# Patient Record
Sex: Female | Born: 1945 | Race: Black or African American | Hispanic: No | Marital: Married | Smoking: Former smoker
Health system: Southern US, Community
[De-identification: ages and names within clinical notes are randomized; demographics above are authoritative.]

## PROBLEM LIST (undated history)

## (undated) DIAGNOSIS — T7840XA Allergy, unspecified, initial encounter: Secondary | ICD-10-CM

## (undated) DIAGNOSIS — T8859XA Other complications of anesthesia, initial encounter: Secondary | ICD-10-CM

## (undated) DIAGNOSIS — G709 Myoneural disorder, unspecified: Secondary | ICD-10-CM

## (undated) DIAGNOSIS — M1990 Unspecified osteoarthritis, unspecified site: Secondary | ICD-10-CM

## (undated) DIAGNOSIS — J449 Chronic obstructive pulmonary disease, unspecified: Secondary | ICD-10-CM

## (undated) DIAGNOSIS — Z923 Personal history of irradiation: Secondary | ICD-10-CM

## (undated) DIAGNOSIS — I10 Essential (primary) hypertension: Secondary | ICD-10-CM

## (undated) DIAGNOSIS — J45909 Unspecified asthma, uncomplicated: Secondary | ICD-10-CM

## (undated) DIAGNOSIS — E119 Type 2 diabetes mellitus without complications: Secondary | ICD-10-CM

## (undated) DIAGNOSIS — C3490 Malignant neoplasm of unspecified part of unspecified bronchus or lung: Secondary | ICD-10-CM

## (undated) DIAGNOSIS — Z9889 Other specified postprocedural states: Secondary | ICD-10-CM

## (undated) DIAGNOSIS — E7800 Pure hypercholesterolemia, unspecified: Secondary | ICD-10-CM

## (undated) DIAGNOSIS — R112 Nausea with vomiting, unspecified: Secondary | ICD-10-CM

## (undated) DIAGNOSIS — J189 Pneumonia, unspecified organism: Secondary | ICD-10-CM

## (undated) HISTORY — DX: Allergy, unspecified, initial encounter: T78.40XA

## (undated) HISTORY — DX: Myoneural disorder, unspecified: G70.9

## (undated) HISTORY — DX: Pure hypercholesterolemia, unspecified: E78.00

## (undated) HISTORY — PX: TUBAL LIGATION: SHX77

## (undated) HISTORY — DX: Type 2 diabetes mellitus without complications: E11.9

---

## 1993-12-30 HISTORY — PX: ABDOMINAL HYSTERECTOMY: SHX81

## 1998-04-19 ENCOUNTER — Other Ambulatory Visit: Admission: RE | Admit: 1998-04-19 | Discharge: 1998-04-19 | Payer: Self-pay | Admitting: Obstetrics and Gynecology

## 1999-05-17 ENCOUNTER — Other Ambulatory Visit: Admission: RE | Admit: 1999-05-17 | Discharge: 1999-05-17 | Payer: Self-pay | Admitting: Obstetrics and Gynecology

## 1999-07-24 ENCOUNTER — Encounter: Admission: RE | Admit: 1999-07-24 | Discharge: 1999-10-22 | Payer: Self-pay | Admitting: Internal Medicine

## 2000-09-16 ENCOUNTER — Other Ambulatory Visit: Admission: RE | Admit: 2000-09-16 | Discharge: 2000-09-16 | Payer: Self-pay | Admitting: Obstetrics and Gynecology

## 2000-10-29 ENCOUNTER — Encounter: Payer: Self-pay | Admitting: Internal Medicine

## 2000-10-29 ENCOUNTER — Encounter: Admission: RE | Admit: 2000-10-29 | Discharge: 2000-10-29 | Payer: Self-pay | Admitting: Internal Medicine

## 2000-11-12 ENCOUNTER — Encounter: Admission: RE | Admit: 2000-11-12 | Discharge: 2000-11-12 | Payer: Self-pay | Admitting: Internal Medicine

## 2000-11-12 ENCOUNTER — Encounter: Payer: Self-pay | Admitting: Internal Medicine

## 2000-12-15 ENCOUNTER — Encounter: Payer: Self-pay | Admitting: Obstetrics and Gynecology

## 2000-12-15 ENCOUNTER — Encounter: Admission: RE | Admit: 2000-12-15 | Discharge: 2000-12-15 | Payer: Self-pay | Admitting: Obstetrics and Gynecology

## 2001-01-05 ENCOUNTER — Observation Stay (HOSPITAL_COMMUNITY): Admission: RE | Admit: 2001-01-05 | Discharge: 2001-01-05 | Payer: Self-pay | Admitting: Obstetrics and Gynecology

## 2001-01-05 ENCOUNTER — Encounter (INDEPENDENT_AMBULATORY_CARE_PROVIDER_SITE_OTHER): Payer: Self-pay

## 2001-09-16 ENCOUNTER — Other Ambulatory Visit: Admission: RE | Admit: 2001-09-16 | Discharge: 2001-09-16 | Payer: Self-pay | Admitting: Obstetrics and Gynecology

## 2001-11-06 ENCOUNTER — Encounter: Payer: Self-pay | Admitting: Internal Medicine

## 2001-11-06 ENCOUNTER — Encounter: Admission: RE | Admit: 2001-11-06 | Discharge: 2001-11-06 | Payer: Self-pay | Admitting: Internal Medicine

## 2001-11-16 ENCOUNTER — Ambulatory Visit (HOSPITAL_COMMUNITY): Admission: RE | Admit: 2001-11-16 | Discharge: 2001-11-16 | Payer: Self-pay | Admitting: Gastroenterology

## 2002-12-15 ENCOUNTER — Encounter: Admission: RE | Admit: 2002-12-15 | Discharge: 2002-12-15 | Payer: Self-pay | Admitting: Internal Medicine

## 2002-12-15 ENCOUNTER — Encounter: Payer: Self-pay | Admitting: Internal Medicine

## 2003-02-01 ENCOUNTER — Encounter: Admission: RE | Admit: 2003-02-01 | Discharge: 2003-02-01 | Payer: Self-pay | Admitting: Internal Medicine

## 2003-02-03 ENCOUNTER — Encounter: Payer: Self-pay | Admitting: Internal Medicine

## 2003-02-03 ENCOUNTER — Encounter: Admission: RE | Admit: 2003-02-03 | Discharge: 2003-02-03 | Payer: Self-pay | Admitting: Internal Medicine

## 2003-03-31 ENCOUNTER — Other Ambulatory Visit: Admission: RE | Admit: 2003-03-31 | Discharge: 2003-03-31 | Payer: Self-pay | Admitting: Obstetrics and Gynecology

## 2003-06-29 ENCOUNTER — Encounter: Payer: Self-pay | Admitting: Internal Medicine

## 2003-06-29 ENCOUNTER — Encounter: Admission: RE | Admit: 2003-06-29 | Discharge: 2003-06-29 | Payer: Self-pay | Admitting: Internal Medicine

## 2003-08-07 ENCOUNTER — Encounter: Payer: Self-pay | Admitting: Internal Medicine

## 2003-08-07 ENCOUNTER — Encounter: Admission: RE | Admit: 2003-08-07 | Discharge: 2003-08-07 | Payer: Self-pay | Admitting: Internal Medicine

## 2003-10-12 ENCOUNTER — Inpatient Hospital Stay (HOSPITAL_COMMUNITY): Admission: EM | Admit: 2003-10-12 | Discharge: 2003-10-16 | Payer: Self-pay | Admitting: *Deleted

## 2003-10-12 ENCOUNTER — Encounter: Payer: Self-pay | Admitting: *Deleted

## 2004-12-30 HISTORY — PX: OTHER SURGICAL HISTORY: SHX169

## 2005-05-21 ENCOUNTER — Encounter: Admission: RE | Admit: 2005-05-21 | Discharge: 2005-05-21 | Payer: Self-pay | Admitting: Orthopedic Surgery

## 2005-05-22 ENCOUNTER — Ambulatory Visit (HOSPITAL_BASED_OUTPATIENT_CLINIC_OR_DEPARTMENT_OTHER): Admission: RE | Admit: 2005-05-22 | Discharge: 2005-05-22 | Payer: Self-pay | Admitting: Orthopedic Surgery

## 2005-05-22 ENCOUNTER — Ambulatory Visit (HOSPITAL_COMMUNITY): Admission: RE | Admit: 2005-05-22 | Discharge: 2005-05-22 | Payer: Self-pay | Admitting: Orthopedic Surgery

## 2005-12-30 DIAGNOSIS — I639 Cerebral infarction, unspecified: Secondary | ICD-10-CM

## 2005-12-30 HISTORY — PX: COLOSTOMY: SHX63

## 2005-12-30 HISTORY — DX: Cerebral infarction, unspecified: I63.9

## 2006-04-01 ENCOUNTER — Other Ambulatory Visit: Admission: RE | Admit: 2006-04-01 | Discharge: 2006-04-01 | Payer: Self-pay | Admitting: Obstetrics and Gynecology

## 2006-11-30 ENCOUNTER — Emergency Department (HOSPITAL_COMMUNITY): Admission: EM | Admit: 2006-11-30 | Discharge: 2006-11-30 | Payer: Self-pay | Admitting: Emergency Medicine

## 2006-12-03 ENCOUNTER — Ambulatory Visit: Payer: Self-pay | Admitting: Critical Care Medicine

## 2006-12-03 ENCOUNTER — Encounter (INDEPENDENT_AMBULATORY_CARE_PROVIDER_SITE_OTHER): Payer: Self-pay | Admitting: Specialist

## 2006-12-03 ENCOUNTER — Inpatient Hospital Stay (HOSPITAL_COMMUNITY): Admission: EM | Admit: 2006-12-03 | Discharge: 2006-12-15 | Payer: Self-pay | Admitting: Emergency Medicine

## 2006-12-28 ENCOUNTER — Ambulatory Visit: Payer: Self-pay | Admitting: Internal Medicine

## 2006-12-30 HISTORY — PX: OTHER SURGICAL HISTORY: SHX169

## 2007-01-02 ENCOUNTER — Encounter: Admission: RE | Admit: 2007-01-02 | Discharge: 2007-01-02 | Payer: Self-pay | Admitting: General Surgery

## 2007-04-20 ENCOUNTER — Encounter: Admission: RE | Admit: 2007-04-20 | Discharge: 2007-04-20 | Payer: Self-pay | Admitting: Surgery

## 2007-06-25 ENCOUNTER — Inpatient Hospital Stay (HOSPITAL_COMMUNITY): Admission: RE | Admit: 2007-06-25 | Discharge: 2007-07-01 | Payer: Self-pay | Admitting: General Surgery

## 2007-06-25 ENCOUNTER — Encounter (INDEPENDENT_AMBULATORY_CARE_PROVIDER_SITE_OTHER): Payer: Self-pay | Admitting: General Surgery

## 2007-08-27 ENCOUNTER — Encounter: Admission: RE | Admit: 2007-08-27 | Discharge: 2007-08-27 | Payer: Self-pay | Admitting: General Surgery

## 2007-12-09 ENCOUNTER — Encounter: Admission: RE | Admit: 2007-12-09 | Discharge: 2007-12-09 | Payer: Self-pay | Admitting: Cardiology

## 2007-12-11 ENCOUNTER — Ambulatory Visit (HOSPITAL_COMMUNITY): Admission: RE | Admit: 2007-12-11 | Discharge: 2007-12-11 | Payer: Self-pay | Admitting: Cardiology

## 2007-12-11 ENCOUNTER — Ambulatory Visit: Payer: Self-pay | Admitting: Vascular Surgery

## 2007-12-28 ENCOUNTER — Encounter: Admission: RE | Admit: 2007-12-28 | Discharge: 2007-12-28 | Payer: Self-pay | Admitting: Cardiology

## 2007-12-30 ENCOUNTER — Encounter: Admission: RE | Admit: 2007-12-30 | Discharge: 2007-12-30 | Payer: Self-pay | Admitting: Cardiology

## 2011-05-14 NOTE — Op Note (Signed)
[name]              ACCOUNT NO.:  [id number]   MEDICAL RECORD NO.:  [id number]          PATIENT TYPE:  INP   LOCATION:  0001                         FACILITY:  [facility]   PHYSICIAN:  Ollen Gross. Vernell Morgans, M.D. 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   PROCEDURES:  Exploratory laparotomy, lysis of adhesions, colostomy  takedown with a low EEA size 29 stapled anastomosis.   SURGEON:  Ollen Gross. Vernell Morgans, M.D.   ASSISTANT:  Angelia Mould. Derrell Lolling, M.D.   ANESTHESIA:  General endotracheal.   PROCEDURE:  After informed consent was obtained, the patient was brought  to the operating room, placed in supine position on the table.  After  adequate induction of general anesthesia, the patient's legs were placed  in stirrups, the abdomen and perirectal area were prepped with Betadine  and draped in the usual sterile manner.  Before prepping, the colostomy  was closed at the skin with a 2-0 silk stitch.  The midline incision was  made with a 10 blade knife through her old incision.  This incision was  carried down through the skin and subcutaneous tissue sharply with  electrocautery until the linea alba was identified.  The old midline  incision into the fascia was also opened sharply with electrocautery.  The preperitoneal space was identified using blunt finger dissection.  An area of free space was identified and opened into the abdominal  cavity.  The rest of the incision was then opened under direct vision.  The patient had some omental adhesion to the anterior abdominal wall  that was taken down sharply with electrocautery.  A Balfour retractor  was then placed, there were some filmy adhesions of  small bowel down in  the pelvis.  Most of this was taken down by blunt finger dissection.  There was some adhesion of the small bowel to the staple line of the  rectal stump.  This again was then taken down sharply with Metzenbaum  scissors without difficulty and small bowel appeared to be healthy.  The  rectal stump was identified.  There was some adhesion of the vaginal  cuff to the staple line.  This had to be taken down sharply with  Metzenbaum scissors.  There was a little bit of a serosal tear in the  rectal stump at this point so we created a new staple line by stapling  just below this serosal tear using a contour stapling device.  Once this  was accomplished, the staple line appeared to be and relatively good  position.  It was reinforced with some 2-0 silk stitches.  The anterior  surface of the rectum was able to be visualized.  This was low enough  that we decided we did need EEA stapled anastomosis.  At this point some  interloop adhesions of small bowel were taken down by combination of  blunt finger dissection and some sharp dissection with Metzenbaum  scissors.  The ostomy site was identified.  There was minimal herniation  of some omentum up into the ostomy.  This was taken down sharply with  the electrocautery.  We were able to dissect up into the abdominal wall  bluntly with finger dissection to encircle the colostomy.  At this point  a colostomy at the skin level was then excised sharply in elliptical  fashion with a 10 blade knife and incision was carried through the skin  into the subcutaneous tissue sharply with electrocautery until the  ostomy was completely freed from the abdominal wall.  The ostomy was  then brought through the abdominal wall and it appeared to be healthy.  The distal couple of centimeters of the ostomy were resected by clamping  the mesentery at this point with Kelly clamps, divided and ligated with  2-0 silk ties and then placing a Allen clamp  across the colon and  cutting along the Allen clamp.  This segment was sent to pathology as  the colostomy. The Allen clamp was removed and colon was opened.  A size  29 stapling device easily entered the colon.  A 2-0 Prolene pursestring  stitch was then placed in a running fashion circumferentially around the  edge of the opening of the colon with full-thickness bites.  The anvil  from the stapling device then placed in the proximal colon and the 2-0  Prolene was cinched down and tied at the base of the anvil.  The splenic  flexure appeared to have already been mobilized and the adhesions of  this a transverse colon and splenic flexure were freed up sharply with  Metzenbaum scissors until there was good mobilization and length on the  proximal segment of colon so that would reach down in the pelvis fairly  easily.  At this point attention was then turned to the rectum.  Dr.  Derrell Lolling went from below between the feet and was able to wash out a  little bit of the distal rectum.  He then placed a size 29 EEA stapling  device inside the rectum.  We were able to visualize the stapler distal  low the staple line of the rectal stump.  We positioned the stapler  appropriately so that the spike would come through the anterior wall of  the rectum.  The spike was then extended.  The anvil on the proximal  segment colon was placed on the spike so that it snapped and attached.  The stapling device was then closed.  A minute was allowed to pass.  The  stapling device was then fired thereby creating a new colorectal  anastomosis.  When the stapling device was removed there were two  complete donuts.  The anastomotic line looked good.  A soft bowel clamp  was placed above the anastomosis on the colon and Dr. Derrell Lolling then did a  rigid sigmoidoscopy.  He was able to see the staple line.  It appeared  to be hemostatic and intact.  We filled the pelvis with saline and there  were no bubbles to indicate any  sort of leak and the bowel at the  anastomosis appeared to be healthy.  The air was then evacuated.  The  abdomen was irrigated copious amounts of saline.  The old ostomy site  was then closed with interrupted #1 Novofil stitches.  The fascia of the  abdominal wall was then closed with two running #1 looped PDS  sutures.  The subcutaneous tissue of both sites was irrigated copious amounts of  saline and Betadine and skin was closed with staples.  Betadine ointment  and sterile dressings were then applied.  The patient tolerated  procedure well.  At end the case all needle, sponge instrument counts  correct.  The patient was awakened, taken recovery in stable condition.      Ollen Gross. Vernell Morgans, M.D.  Electronically Signed     PST/MEDQ  D:  06/25/2007  T:  06/25/2007  Job:  [id number]

## 2011-05-14 NOTE — Discharge Summary (Signed)
[name], DEENEY              ACCOUNT NO.:  [id number]   MEDICAL RECORD NO.:  [id number]          PATIENT TYPE:  INP   LOCATION:  1526                         FACILITY:  [facility]   PHYSICIAN:  Ollen Gross. Vernell Morgans, M.D. DATE OF BIRTH:  16-Nov-1946   DATE OF ADMISSION:  06/25/2007  DATE OF DISCHARGE:  07/01/2007                               DISCHARGE SUMMARY   HISTORY OF PRESENT ILLNESS:  Ms. Briney is a 65 year old black female  who had a previous colostomy for perforated diverticulitis.  She was  brought to the operating room on June 26 for a colostomy takedown.  She  had to have a low EEA stapled anastomosis which she tolerated well.  Postoperatively, her NG tube was left in on bowel rest.  She was  maintained on PCA for pain control, but she did very well.  She did have  one episode of some chest pain and shortness of breath, but her cardiac  enzymes and EKG and chest x-ray were all okay.  We were able to clamp  her NG tube on the 29th and removed the NG tube on the 30th and started  her on clears.  Her diet was advanced slowly and by July 2 she was  tolerating her diet.  She looked very good and was ready for discharge  home.   MEDICATIONS:  She was to resume her home medications.  She was given a  prescription for pain medicine.   DISCHARGE INSTRUCTIONS:  1. Activity is no heavy lifting.  2. Diet is as tolerated.  3. Follow-up will be with Dr. Carolynne Edouard in a week or two, and she is      discharged home.  She was also placed on Avelox for possible      pneumonia which she went home with.      Ollen Gross. Vernell Morgans, M.D.  Electronically Signed     PST/MEDQ  D:  08/18/2007  T:  08/18/2007  Job:  [id number]

## 2011-05-17 NOTE — Op Note (Signed)
[name], [name]              ACCOUNT NO.:  [id number]   MEDICAL RECORD NO.:  [id number]          PATIENT TYPE:  INP   LOCATION:  1606                         [facility]ACILITY:  [facility]   PHYSICIAN:  Ollen Gross. Vernell Morgans, M.D. DATE OF BIRTH:  10-17-1946   DATE OF PROCEDURE:  12/03/2006  DATE OF DISCHARGE:                               OPERATIVE REPORT   PREOPERATIVE DIAGNOSIS:  Bowel perforation with free intra-abdominal  air.   POSTOPERATIVE DIAGNOSIS:  Bowel perforation with free intra-abdominal  air, perforated sigmoid diverticulitis.   PROCEDURE:  Exploratory laparotomy, sigmoid colectomy with descending  colostomy and Hartmann pouch with mobilization of the splenic flexure.   SURGEON:  Ollen Gross. Vernell Morgans, M.D.   ASSISTANT:  Leonie Man, M.D.   ANESTHESIA:  General endotracheal.   PROCEDURE:  After informed consent was obtained, the patient was brought  to the operating room, placed in the supine position on the operating  table.  After induction of general anesthesia, the patient's abdomen was  prepped with Betadine and draped in usual sterile manner.  A midline  incision was made with the 10 blade knife.  This incision was carried  down through the skin and subcutaneous tissue sharply with  electrocautery until the linea alba was identified.  The linea alba was  also incised with electrocautery.  The preperitoneal space was probed  bluntly hemostat, until the peritoneum was opened and access was gained  to the abdominal cavity.  The rest incision was then opened under direct  vision with electrocautery.  A Balfour retractor was used to retract the  abdominal sidewall laterally.  The patient had a lot of contamination in  her abdomen.  Cultures were obtained.  The patient had a lot of dense  adhesions and inflammation from all the contamination to suggest that  this had been going on for least a couple of days.  The patient had a  lot of interloop small bowel abscesses and these  were all freed up by  blunt palpation of the small bowel from the ligament of Treitz to the  ileocecal valve.  Once this was accomplished, the small bowel was able  to be packed into the upper abdomen with moist gauze.  The sigmoid colon  was evaluated and appeared to be the source of the perforation.  The  patient had about a quarter size hole in the anterior surface of the  rectosigmoid area.  The sigmoid colon was mobilized by incising its  retroperitoneal attachment along the white line of Toldt.  There was  very dense adhesions and inflammation in this left lower quadrant  especially right around the pelvic brim.  Care was taken to try to stay  up on the mesentery away from any potential place where the ureter could  be.  A site was chosen above the area of inflammation for division of  the colon.  The mesentery at this point was opened sharply with  electrocautery and GIA 75 stapler placed across the bowel at this point,  clamped and fired thereby dividing bowel between staple lines.  The  mesentery to the  sigmoid colon was taken down using the LigaSure in some  of them and one or two of the major vessels in the mesentery were also  clamped with Kelly clamps, divided and ligated with 2-0 silk ties.  This  dissection was carried down into the pelvis until we were just at the  level below the area of perforation. The mobilization of the mesentery  at this point was brought up to the edge of the colon wall.  A TA-90  green load stapler was then placed across the colon rectosigmoid at this  point below the area of perforation, clamped and fired and the colon  above this was divided sharply with 10 blade knife.  This allowed [place] to  remove that section of the diseased sigmoid colon and it was sent to  pathology for further evaluation.  The abdomen was irrigated with  copious amounts of saline.  The stump of a rectal stump was marked with  two 2-0 Prolene stitches.  Next the attention was  turned to the  descending colon.  The descending colon was not mobile enough to be able  to bring this up as a colostomy through the abdominal wall.  We  therefore had to mobilize the splenic flexure.  This was done by blunt  finger dissection and some sharp dissection with the cautery.  Once that  was accomplished, the descending colon was able to be brought up more  into the wound.  It still appeared as though the distal end of this  descending colon was not mobile enough to come up so we had to divide  part of the mesentery at this distal segment.  In doing this, the very  distal few centimeters of the colon appeared to be a little bit ischemic  and this was also resected by clamping mesentery with Kelly clamps,  dividing and ligating these vessels with 2-0 silk ties.  Once the colon  was brought back more proximal and the splenic flexure was mobilized,  there was now plenty of mobility to the colon be brought through the  abdominal wall.  The site was chosen on the abdominal wall for creation  of the colostomy and the skin at the site was excised sharply in a  circular manner with a 10 blade knife.  A core of subcutaneous fat was  removed sharply with electrocautery.  A cruciate incision was made in  the fascia so that three fingers could be brought through the opening.  A Babcock grasper was then placed through the opening and used to grasp  the staple line of the descending colon and this was easily brought  through this opening to create the colostomy.  Again the abdomen was  then irrigated copious amounts of saline and all loculations were broken  up.  Once this was accomplished, the fascia of the anterior abdominal  wall was closed with two running #1 double-stranded PDS sutures.  The  subcutaneous tissue was irrigated copious amounts of saline and some  staples were placed in the skin very widely just to try to maintain the skin away from the ostomy site and then the rest of the  wound was packed  open with moistened Kerlix sterile towel was placed over the midline and  the ostomy was then addressed.  The staple line was excised sharply with  electrocautery.  The ostomy was then matured with 3-0 Vicryl stitches.  Once this was accomplished, the ostomy appeared nicely pink and viable  and patent.  An ostomy appliance was applied and sterile dressings were  applied to the midline.  The patient tolerated well.  At the end of the  case all needle, sponge and instrument counts were correct.  The patient  was then awakened and taken to recovery for further resuscitation and  evaluation.      Ollen Gross. Vernell Morgans, M.D.  Electronically Signed     PST/MEDQ  D:  12/10/2006  T:  12/10/2006  Job:  [id number]

## 2011-05-17 NOTE — H&P (Signed)
NAMEFRANCINE, Horton              ACCOUNT NO.:  [id number]   MEDICAL RECORD NO.:  [id number]          PATIENT TYPE:  INP   LOCATION:  0098                         FACILITY:  [facility]   PHYSICIAN:  Ollen Gross. Vernell Morgans, M.D. DATE OF BIRTH:  [birth year]   DATE OF ADMISSION:  12/03/2006  DATE OF DISCHARGE:                              HISTORY & PHYSICAL   [name] is a 65 year old black female who has been having abdominal  pain since last Sunday.  She came to the ER last Sunday, where she  underwent a CT scan that showed sigmoid diverticulitis.  She was then  sent home on oral antibiotics, but her pain continued to worsen.  She  now has pain all over her abdomen.  She denies any fevers but has been  chilled.  She also feels a little bit short of breath.  Her other review  of systems is unremarkable.   Her past medical history is significant for diverticulitis, high  cholesterol, diabetes.   Past surgical history is significant for total vaginal hysterectomy.   Medications include Lipitor, Levaquin, Flagyl, and Percocet.   ALLERGIES:  No known drug allergies.   SOCIAL HISTORY:  She denies the use of alcohol or tobacco products.   FAMILY HISTORY:  Noncontributory.   PHYSICAL EXAMINATION:  VITAL SIGNS:  Her temp is 98.3, blood pressure  124/75, pulse 128.  GENERAL:  She is a well-developed and well-nourished white female who  does appear to be a little bit short of breath.  Her sats are 89% on  room air.  SKIN:  Warm and dry with no jaundice.  EYES:  Extraocular muscles are intact.  Pupils are equal, round and  reactive to light.  Sclerae are anicteric.  LUNGS:  She has bilateral rhonchi with some use of accessory respiratory  muscles.  HEART:  Regular rate and rhythm with an impulse in the left chest.  ABDOMEN:  Diffusely tender with guarding and peritonitis.  EXTREMITIES:  No clubbing, cyanosis or edema.  Good strength in her arms  and legs.  PSYCHOLOGICAL:  She is alert and  oriented x3 with no evidence of anxiety  or depression.   Her labs were reviewed and were significant for a white count of 14.8,  hemoglobin 13.1.  Sodium 122, potassium 3.6, chloride 83.  Liver  functions were normal.   She had a chest x-ray and abdominal x-ray done that showed free  intraabdominal air.   ASSESSMENT/PLAN:  This is a 65 year old black female who appears to have  a perforated sigmoid diverticulitis.  Because of the risk of sepsis, I  think she needs to have urgent surgery this afternoon for exploration  and possible sigmoid colectomy and colostomy.  I have explained to her  in detail the risks and benefits of the operation as well as some of the  technical aspects, and she understands and wishes to proceed.  I am also  a little concerned that with her increasing shortness of breath that she  may end up on the ventilator for a while, and I have discussed this with  her  and she understands.  We will arrange for surgery this afternoon as soon  as the OR allows and will start her on some broad-spectrum antibiotic  therapy and starting some fluid resuscitation, given that she is  tachycardic, and her sodium is 122.      Ollen Gross. Vernell Morgans, M.D.  Electronically Signed     PST/MEDQ  D:  12/03/2006  T:  12/03/2006  Job:  [id number]

## 2011-05-17 NOTE — H&P (Signed)
[name], [name]                        ACCOUNT NO.:  [id number]   MEDICAL RECORD NO.:  [id number]                   PATIENT TYPE:  INP   LOCATION:  1856                                 FACILITY:  [facility]   PHYSICIAN:  Theressa Millard, M.D.                 DATE OF BIRTH:  [birth year]   DATE OF ADMISSION:  10/12/2003  DATE OF DISCHARGE:                                HISTORY & PHYSICAL   HISTORY OF PRESENT ILLNESS:  The patient is a 65 year old black female  admitted with asthmatic bronchitis.  She has no real history of pulmonary  problems until March of this year when she developed a cough after exposure  to hair dye. Since then she has had a tickle in her throat and a chronic  cough.  She has seen an allergist four times and has tried various  combinations of medications including albuterol, Advair, Pulmicort,  Clarinex, and Nexium.  At the current time, she is on Pulmicort, Clarinex,  and Nexium.  She also takes another medication, the name of which she does  not know.   She recently has had a markedly worsening cough and was seen yesterday at  [facility] by Dr. Delrae Alfred and treated with nebulizers, prednisone, and Z-  Pak.  She had lots of trouble through the night.  She came to the emergency  department today after calling [facility].  She is coughing up some  material, but does not know what color it is.  She has had two nebulizers  and improved after each, but then worsened.   PAST SURGICAL HISTORY:  Vaginal hysterectomy with fibroids.  Unilateral  salpingo-oophorectomy.   PAST MEDICAL HISTORY:  Diverticulitis, shoulder pain ?etiology,  hyperlipidemia.   ALLERGIES:  NIASPAN gives her flushing, and LIPITOR gives her GI upset.   SOCIAL HISTORY:  Smoking; quit in the past three days.  Half pack per day  before that.  Alcohol; none.  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   FAMILY HISTORY:  Father died of MI.  Mother died of natural causes at age  56.   REVIEW OF SYSTEMS:  All other systems are negative.   PHYSICAL EXAMINATION:  GENERAL: Well-developed, well-nourished, in no acute  distress. He is coughing intermittently.  VITAL SIGNS: Blood pressure 140/70, pulse 103, respiratory rate 22 and  unlabored. Oxygen saturation 91% on room air.  HEENT:  Pupils equal, round, and reactive to light.  Extraocular movements  are intact.  Funduscopic examination is normal.  The ears are normal.  Nose  and throat are unremarkable.  NECK:  Supple, thyroid is not enlarged or tender.  CHEST:  Diffuse inspiratory and expiratory wheezing.  HEART:  Normal S1 and S2 without an S3, S4, murmur, rub, or click.  ABDOMEN:  Soft and nontender with normal bowel sounds without  hepatosplenomegaly or mass.  EXTREMITIES:  Without cyanosis, clubbing, or edema.  LABORATORY DATA:  CK-MB negative, BNP less than 5.  Hemoglobin 14.1, white  count 8400.   RADIOLOGY:  CT scan shows left lower lobe atelectasis and a small pleural  effusion.  There is no evidence of PE.   IMPRESSION:  Asthmatic bronchitis. The patient has failed outpatient  treatment with nebulizers, prednisone, etc.  Will be admitted, placed on  Solu-Medrol, nebulizers, and Rocephin.  Will try to ambulate and get out of  the hospital as quickly as possible.                                                Theressa Millard, M.D.    JO/MEDQ  D:  10/12/2003  T:  10/12/2003  Job:  [id number]

## 2011-05-17 NOTE — Op Note (Signed)
NAMEAFTIN, LYE              ACCOUNT NO.:  [id number]   MEDICAL RECORD NO.:  [id number]          PATIENT TYPE:  OUT   LOCATION:  DFTL                         FACILITY:  [facility]   PHYSICIAN:  Harvie Junior, M.D.   DATE OF BIRTH:  [birth year]   DATE OF PROCEDURE:  05/22/2005  DATE OF DISCHARGE:  05/22/2005                                 OPERATIVE REPORT   PREOPERATIVE DIAGNOSIS:  Carpal tunnel syndrome, right with trigger thumb,  right.   POSTOPERATIVE DIAGNOSIS:  Carpal tunnel syndrome, right with trigger thumb,  right.   OPERATION PERFORMED:   SURGEON:  Harvie Junior, M.D.   ASSISTANT:  Marshia Ly, P.A.   ANESTHESIA:  General.   INDICATIONS FOR PROCEDURE:  Ms. Stogner is a 65 year old female with a long  history of having significant symptomatic right carpal tunnel syndrome. We  ultimately had diagnosed her some many months ago and with EMG which showed  that she needed urgent intervention because of axonal loss and  reason we  did not see her for another five months ago and she is brought to the  operating room for carpal tunnel release based on EMG findings and failure  of conservative care.  She was also noted to have a trigger thumb prior to  surgery and this was felt to need to be released at the same time.  The  patient was brought to the operating room for these procedures.   DESCRIPTION OF PROCEDURE:  The patient was brought to the operating room and  after adequate anesthesia was obtained with general anesthetic, the patient  was placed supine on the operating table.  The right hand was then prepped  and draped in the usual sterile fashion.  Following this, a curved incision  was made just ulnar to the midline wrist crease and subcutaneous tissue  dissected down to the level of the volar carpal ligament, which was clearly  identified.  A small rent was made and a Therapist, nutritional was used to free up  the nerve underneath.  The ligament was then divided  proximally and  distally.  Excellent of the ligament was identified.  A gloved finger could  be placed in the wound proximally and distally.  Attention was then turned  to the nerve which was very beat-up looking.  The nerve was identified for  any signs of any kind of inflammatory condition.  There was a small amount  of inflammatory synovitis in the canal, which was debrided off the flexor  tendon and at this point the wound was copiously irrigated and suctioned  dry.  The incision was then closed with a combination of interrupted and  running 4-0 nylon suture.  Sterile and compressive dressing was applied and  attention was then turned to the right thumb where a small linear incision  was made in the distal thumb crease.  Subcutaneous tissue dissected to the  level of the A1 pulley which was then identified and divided, care being  taken to protect the digital nerves with the exposure.  Once the A1 pulley  had been released, the flexor  tendon to the thumb could be pulled off the  wound easily with no restriction.  At this point the wound was copiously  irrigated and suctioned dry, closed with interrupted nylon sutures.  Sterile compressive dressing was applied to both wounds at this point as  well as a volar plaster.  The patient was taken to the recovery room where  she was noted to be in satisfactory condition.  The estimated blood loss for  this procedure was none.       JLG/MEDQ  D:  07/16/2005  T:  07/17/2005  Job:  [id number]

## 2011-05-17 NOTE — Discharge Summary (Signed)
[facility]  Patient:    [name], [name]                     MRN: [id number] Adm. Date:  [id number] Disc. Date: [id number] Attending:  Shaune Spittle Dictator:   Henreitta Leber, P.A.                           Discharge Summary  DATE OF BIRTH:                22-Oct-1946.  DISCHARGE DIAGNOSES:          1. Pelvic pain.                               2. Left hydrosalpinx.                               3. Extensive pelvic adhesions.                               4. Left ovarian cyst.                               5. Right parovarian cyst.  PROCEDURE ON January 05, 2001:              Operative laparoscopy with a left                               salpingo-oophorectomy, lysis of adhesions,                               and a right ovarian cystectomy.  HISTORY OF PRESENT ILLNESS:   [name] is a 65 year old, menopausal, married, African-American female who is status post total vaginal hysterectomy with a history of pelvic pain. Patient was found in November of 2001 to have a persistent left ovarian cystic mass with septations and presents for laparoscopic removal of the same. Please see patients dictated history and physical examination for details.  PHYSICAL EXAMINATION:  VITAL SIGNS:                  Blood pressure 120/80.  GENERAL:                      Within normal limits.  PELVIC:                       EGBUS is within normal limits. The vagina is slightly atrophic with a well-healed and well-suspended vaginal vault. The cervix and uterus are surgically absent. Adnexa without masses that are palpable.  RECTOVAGINAL:                 No masses.  HOSPITAL COURSE:              On January 05, 2001 patient underwent a diagnostic laparoscopy which resulted in a left salpingo-oophorectomy, lysis of adhesions, and a right ovarian cystectomy. Patient tolerated all procedures well. On the afternoon of patients surgery, she was able to  tolerate a regular diet, resume bowel and bladder functions, and  deemed ready for discharge home.  DISCHARGE MEDICATIONS:        1. Vioxx 12.5 mg one tablet daily for pain.                               2. Vicodin one to two tablets every four to six                                  hours as needed for pain.  DISCHARGE INSTRUCTIONS:       Patient given a copy of [facility] Instructions for Laparoscopy. Patient was further advised to call the office of [facility] and Gynecology for any questions or concerns.  FOLLOWUP:                     Patient is to follow up with Dr. Dierdre Forth at [facility] and Gynecology in two weeks.  PATHOLOGY:                    Not available at time of discharge.DD:  01/05/01 TD:  01/05/01 Job: [id number]

## 2011-05-17 NOTE — Discharge Summary (Signed)
NAMESUGAR, VANZANDT                        ACCOUNT NO.:  [id number]   MEDICAL RECORD NO.:  [id number]                   PATIENT TYPE:  INP   LOCATION:  5732                                 FACILITY:  [facility]   PHYSICIAN:  Marcene Duos, M.D.         DATE OF BIRTH:  Jun 08, 1946   DATE OF ADMISSION:  10/12/2003  DATE OF DISCHARGE:  10/16/2003                                 DISCHARGE SUMMARY   ADMITTING DIAGNOSES:  1. Acute bronchitis.  2. Asthma exacerbation secondary to number 1.  3. Tobacco abuse.   DISCHARGE DIAGNOSES:  1. Acute bronchitis.  2. Asthma exacerbation secondary to number 1.  3. Tobacco abuse.  4. Hyperglycemia.   HISTORY OF PRESENT ILLNESS/HOSPITAL COURSE:  This is a 65 year old female,  patient of mine, admitted by Dr. Benjaman Kindler on October 12, 2003, with a  history of worsening cough for which he was seen by myself the day previous  at the office.  The patient had been initiated on prednisone, Z-pak, and  treated with albuterol.  The patient came to the ER the following day with  worsening symptoms.   On physical examination, oxygen saturation was 91% on room air.  She had  diffuse inspiratory and expiratory wheezing.  The rest of her exam was  essentially unremarkable. Cardiac enzymes were negative.  White count 8400,  hemoglobin 14.1.   CT scan showed left lower-lobe atelectasis, small pleural effusion, no  evidence of PE.  The patient was admitted, placed on IV Solu-Medrol,  nebulizers and Rocephin.   HOSPITAL COURSE:  Problem #1.  Acute bronchitis with asthma exacerbation.  The patient was switched to IV Zithromax the day after admission.  Expectorant and regular nebulizations were added.  Over the ensuing days,  she was gradually weaned to oral medications with gradual improvement.  Lungs with occasional wheeze on discharge, October 16, 2003, but much  improved.   Problem #2.  Hyperglycemia.  The patient was noted the second day of  admission that her sugars were quite high.  She was placed on an insulin  sliding scale.  Because her sugars were still running in the 200's at the  time of discharge, she was initiated on Amaryl until she could complete her  weaning as an outpatient.  Sugars were much improved with this treatment.   DISCHARGE MEDICATIONS:  The patient was discharged on October 16, 2003, on  the following medications  1. Prednisone 40 mg daily for three more days to wean to 20 mg for three     days thereafter, and then 10 mg and then off.  2. Nexium 40 mg daily.  3. Zithromax 500 mg daily.  4. Clarinex 5 mg daily.  5. Pulmicort Turbuhaler once daily.  6. Albuterol MDI two puffs every four hours as needed.  7. Ranitidine 150 mg at bedtime.  8. Amaryl 2 mg once daily.   DISCHARGE INSTRUCTIONS:  1. She was to call the  office for followup in one week with Dr. Delrae Alfred.  2. Follow a low-carbohydrate diet.   DISCHARGE CONDITION:  The patient was discharged in improved condition.                                                Marcene Duos, M.D.    EMM/MEDQ  D:  11/18/2003  T:  11/20/2003  Job:  [id number]

## 2011-05-17 NOTE — H&P (Signed)
[facility]  Patient:    [name], [name]                        MRN: [id number] Adm. Date:  01/05/01 Attending:  Erie Noe P. Pennie Rushing, M.D.                         History and Physical  HISTORY OF PRESENT ILLNESS:  The patient is a 65 year old black married female who underwent a total vaginal hysterectomy in 1994 who presents for evaluation and treatment of a cystic pelvic mass thought to be of ovarian origin.  She was originally seen on October 29, 2000, for abdominal pelvic pain and underwent a pelvic ultrasound on November 12, 2000, which showed a 4.4 x 3.7 x 1.9 cm cyst associated with the left ovary, simple in appearance, and containing a thin septation within its inferior portion.  The patient was noted to be menopausal based on an estradiol less than 20 and an FSH of 31.2. She also underwent a CA-125 which was normal at 5.0.  A repeat pelvic ultrasound on December 15, 2000, showed no significant change in the left ovarian cyst with some septation and the right ovary was thought to be normal. The patient is now completely asymptomatic without any further abdominal pain.  PAST MEDICAL HISTORY:  GYN history:  The patient underwent a total vaginal hysterectomy in 1994 for abnormal uterine bleeding and fibroids.  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  FAMILY HISTORY:  Positive for hypertension, glaucoma, hypothyroidism, heart disease, diabetes.  SOCIAL HISTORY:  The patient smokes approximately a half pack of cigarettes a day and has for the last 20 years.  Alcohol - none.  Recreational drugs - none.  OBSTETRICAL HISTORY:  The patient has three biological children.  REVIEW OF SYSTEMS:  Positive for vasomotor symptoms.  She denies any abdominal pain, nausea, vomiting,  constipation, or diarrhea.  PHYSICAL EXAMINATION:  VITAL SIGNS:  Blood pressure is 120/80.  LUNGS:  Clear.  HEART:  Regular  rate and rhythm.  ABDOMEN:  Soft without masses or organomegaly.  EXTREMITIES:  No clubbing, cyanosis, or edema.  PELVIC:  EG, BUS within normal limits.  The vagina is slightly atrophic with a well-healed and well-suspended vaginal vault.  The cervix and uterus are surgically absent.  Adnexa - no masses that are palpable.  RECTOVAGINAL:  No masses.  IMPRESSION:  Persistent left ovarian cystic mass with septations, with a normal CA-125 and a simple cystic appearance eon ultrasound.  DISPOSITION:  Discussion is held with patient concerning the fact that persistence of this pelvic mass causes [place] to need to investigate its origin. She will thus undergo diagnostic laparoscopy with pelvic and peritoneal washings and possible left oophorectomy on January 05, 2001.  The patient seems to understand the risks of anesthesia, bleeding, infection, and damage to adjacent organs.  She wishes to proceed with left oophorectomy. DD:  12/25/00 TD:  12/25/00 Job: [id number]

## 2011-05-17 NOTE — Op Note (Signed)
[facility]  Patient:    [name], [name]                     MRN: [id number] Proc. Date: 01/05/01 Adm. Date:  [id number] Attending:  Shaune Spittle CC:         Quitman Livings, M.D.   Operative Report  PREOPERATIVE DIAGNOSIS:        Pelvic pain, left adnexal mass, apparent ovarian cyst.  POSTOPERATIVE DIAGNOSIS:       Left hydrosalpinx, extensive left pelvic adhesions, left ovarian cyst and right para-ovarian cyst.  OPERATION:                                Operative laparoscopy, left salpingo-oophorectomy, lysis of adhesions and right ovarian cystectomy.  SURGEON:                       Vanessa P. Pennie Rushing, M.D.  FIRST ASSISTANT:               Henreitta Leber, PA-C.  ANESTHESIA:                    General orotracheal.  ESTIMATED BLOOD LOSS:          Approximately 100 cc.  COMPLICATIONS:                 None.  FINDINGS:                      The left tube and ovary were densely adherent to the left pelvic sidewall.  The left tube was dilated significantly and adherent to the left ovary.  The right ovary appeared normal for the perimenopausal/postmenopausal state except for a 3 mm clear unilocular cyst which appeared to be para-ovarian, but may have been part of the ovarian tissue.  There were no peritoneal excrescences.  There were extensive adhesions between the bowel and left pelvic sidewall.  DESCRIPTION OF PROCEDURE:      The patient was taken to the operating room fter appropriate identification and placed on the operating table.  After the attainment of adequate general anesthesia, she was placed in the lithotomy position.  The abdomen, perineum and vagina were prepped with multiple layers of Betadine and a Foley catheter inserted into the bladder under sterile conditions and connected to straight drainage. A sponge stick with three sponges was placed in the vagina.  The abdomen was draped as a sterile field. The subumbilical and  superpubic regions were infiltrated with 0.25% Marcaine a total of 10 cc.  A subumbilical incision was made and a Veress cannula placed through that incision into the peritoneal cavity.  A pneumoperitoneum was created with 4L of CO2.  The Veress cannula was removed and laparoscopic trocar placed through that incision into the peritoneal cavity.  The laparoscope was placed through the trocar sleeve.  Superpubic incisions were made to the right and left of midline and laparoscopic probe trocars placed through those incisions into the peritoneal cavity under direct visualization. The above noted findings were made and documented.  The Harmonic scalpel was used to meticulously lyse the adhesions between the bowel and pelvic sidewall, the bowel and left tube and ovary and to dissect the left tube and ovary away from the left pelvic sidewall.  The ureter was identified early on and hydrodissection was used to increase the distance between the  left pelvic peritoneum and the ureter.  This allowed the left ovary to be dissected out of that fossa and elevated.  The tube and ovary were dissected down to the level of infundibulopelvic ligament which was then tied with three Endoloops and the tube and ovary excised.  On the right pelvic sidewall attempt was made to aspirate the cyst; however, once the needlepoint touched the cyst, it ruptured and there was egress of a minimal amount of straw-colored fluid.  The cyst wall was then excised sharply from the right adnexal region and removed from the operative field.  Copious irrigation was carried out and hemostasis noted to be adequate.  The left adnexa was then placed in an endo bag and brougth through the subumbilical incision as the process was visualized with a 5 mm scope which had been placed through the trocar port in the left superpubic region.  The pelvis was again inspected and hemostasis noted to be adequate. Copious irrigation was carried out  leaving approximately 100 cc of warm lactated ringers in the pelvis.  All instruments were then removed from the peritoneal cavity under direct visualization and the CO2 was allowed to escape.  A deep suture was placed in the fascia of the subumbilical incision of 0 Vicryl.  The skin incisions were reapproximated with Dermabond.  The sponge stick was removed from the vagina and  the patient awakened from general anesthesia and then taken to the recovery room in satisfactory condition having tolerated the procedure well.  Sponge and instrument counts were correct. DD:  01/05/01 TD:  01/05/01 Job: [id number]

## 2011-05-17 NOTE — Discharge Summary (Signed)
[name], [name]              ACCOUNT NO.:  [id number]   MEDICAL RECORD NO.:  [id number]          PATIENT TYPE:  INP   LOCATION:  1606                         [facility]ACILITY[facility]  [facility]   PHYSICIAN:  Ollen Gross. Vernell Morgans, M.D. DATE OF BIRTH:  1946/05/06   DATE OF ADMISSION:  12/03/2006  DATE OF DISCHARGE:  12/15/2006                               DISCHARGE SUMMARY   Ms. Leisure is a 65 year old black female who was admitted on December 5  with evidence of peritonitis and free air from a perforated sigmoid  diverticulitis.  She was taken to the operating room urgently and  underwent a sigmoid colectomy and a colostomy.  Postoperatively she was  showing signs of sepsis and was transferred to the ICU for recovery.  Critical care medicine was consulted to help [place] with her critical care  management.  She was started on Unison and Flagyl for antibiotic  coverage.  Critical care placed a central line and began sepsis  protocol.  She maintained an NG tube and bowel rest and was started on  TPN within the first couple of days.  She gradually improved.  She never  did require intubation and mechanical ventilation.  Her wound was left  open and dressing changes were done.  On December 8, she started  receiving some diuretics, and she tolerated this well.  She she did have  1 skin bleeder in the wound that required a stitch by Dr. Zachery Dakins on  December 9.  She continued to maintain an elevated white count around  18,000 and on December 10, she underwent a CT scan of her abdomen and  pelvis to rule out abscess.  She did have some small amount of fluid in  the pericolic gutter on December 11.  Interventional radiology was able  to aspirate this, but it was not enough to place a drain in.  We were  able to clamp her NG tube on December 12 which she seemed to tolerate.  Also, during this period of time, she was noted to have thrombocytosis  with an elevated platelet count and once her NG tube was out and she  was  started on clears, she was also started on an aspirin for this.  Hematology consults were obtained.  Her diet was slowly advanced, and  she was switched to Primaxin which helped her white count.  Her central  line was removed on December 14, and she continued to improve and by  December 17, she was tolerating a diet, ambulating and she was ready for  discharge home.   HER MEDICATIONS AT TIME OF DISCHARGE:  She was to resume her home  medications, and she was given prescriptions of Vicodin for pain, and  she was instructed to start aspirin for her thrombocytosis.  Her diet is  as tolerated.  Home Health was consulted for home health dressing  changes.   ACTIVITIES:  No heavy lifting.  Condition is stable.   FINAL DIAGNOSIS:  Perforated sigmoid diverticulitis.   FOLLOWUP:  With Dr. Carolynne Edouard in the next week or two.      Ollen Gross. Carolynne Edouard  III, M.D.  Electronically Signed     PST/MEDQ  D:  03/10/2007  T:  03/12/2007  Job:  [id number]

## 2011-07-11 ENCOUNTER — Other Ambulatory Visit: Payer: Self-pay | Admitting: Internal Medicine

## 2011-07-11 ENCOUNTER — Ambulatory Visit
Admission: RE | Admit: 2011-07-11 | Discharge: 2011-07-11 | Disposition: A | Discharge status: Home / Self Care | Payer: BC Managed Care – PPO | Source: Ambulatory Visit | Attending: Internal Medicine | Admitting: Internal Medicine

## 2011-07-11 DIAGNOSIS — R05 Cough: Secondary | ICD-10-CM

## 2011-07-11 DIAGNOSIS — R059 Cough, unspecified: Secondary | ICD-10-CM

## 2011-10-16 LAB — BASIC METABOLIC PANEL WITH GFR
BUN: 5 — ABNORMAL LOW
CO2: 24
Calcium: 7.9 — ABNORMAL LOW
Chloride: 107
Creatinine, Ser: 0.69
GFR calc non Af Amer: >60
Glucose, Bld: 131 — ABNORMAL HIGH
Potassium: 3.7
Sodium: 138

## 2011-10-16 LAB — BASIC METABOLIC PANEL
BUN: 6
BUN: 7
CO2: 26
CO2: 27
Calcium: 8.1 — ABNORMAL LOW
Calcium: 9.7
Chloride: 107
Chloride: 110
Creatinine, Ser: 0.64
Creatinine, Ser: 0.72
GFR calc Af Amer: >60
GFR calc Af Amer: >60
GFR calc non Af Amer: >60
GFR calc non Af Amer: >60
Glucose, Bld: 123 — ABNORMAL HIGH
Glucose, Bld: 157 — ABNORMAL HIGH
Potassium: 4
Potassium: 4.5
Sodium: 141
Sodium: 142

## 2011-10-16 LAB — DIFFERENTIAL
Basophils Absolute: 0
Basophils Absolute: 0.1
Basophils Absolute: 0.1
Basophils Relative: 0
Basophils Relative: 1
Basophils Relative: 1
Eosinophils Absolute: 0
Eosinophils Absolute: 0
Eosinophils Absolute: 0
Eosinophils Relative: 0
Eosinophils Relative: 0
Eosinophils Relative: 1
Lymphocytes Relative: 13
Lymphocytes Relative: 20
Lymphocytes Relative: 45
Lymphs Abs: 1.5
Lymphs Abs: 1.9
Lymphs Abs: 2.8
Monocytes Absolute: 0.4
Monocytes Absolute: 0.4
Monocytes Absolute: 0.4
Monocytes Relative: 4
Monocytes Relative: 4
Monocytes Relative: 6
Neutro Abs: 3.1
Neutro Abs: 7.3
Neutro Abs: 8.9 — ABNORMAL HIGH
Neutrophils Relative %: 48
Neutrophils Relative %: 75
Neutrophils Relative %: 83 — ABNORMAL HIGH

## 2011-10-16 LAB — CBC
HCT: 30.1 — ABNORMAL LOW
HCT: 32.1 — ABNORMAL LOW
HCT: 38.6
Hemoglobin: 10.3 — ABNORMAL LOW
Hemoglobin: 11.1 — ABNORMAL LOW
Hemoglobin: 13.3
MCHC: 34.4
MCHC: 34.6
MCHC: 34.6
MCV: 86.5
MCV: 86.7
MCV: 87
Platelets: 370
Platelets: 371
Platelets: 482 — ABNORMAL HIGH
RBC: 3.45 — ABNORMAL LOW
RBC: 3.71 — ABNORMAL LOW
RBC: 4.46
RDW: 15.3 — ABNORMAL HIGH
RDW: 15.6 — ABNORMAL HIGH
RDW: 15.8 — ABNORMAL HIGH
WBC: 10.8 — ABNORMAL HIGH
WBC: 6.4
WBC: 9.7

## 2011-10-16 LAB — CARDIAC PANEL(CRET KIN+CKTOT+MB+TROPI)
CK, MB: 5.4 — ABNORMAL HIGH
Relative Index: 0.3
Total CK: 1674 — ABNORMAL HIGH
Troponin I: 0.02

## 2012-10-26 ENCOUNTER — Ambulatory Visit: Payer: Self-pay | Admitting: Obstetrics and Gynecology

## 2012-12-30 HISTORY — PX: COLONOSCOPY: SHX174

## 2012-12-30 HISTORY — PX: OTHER SURGICAL HISTORY: SHX169

## 2013-01-07 DIAGNOSIS — H10409 Unspecified chronic conjunctivitis, unspecified eye: Secondary | ICD-10-CM | POA: Diagnosis not present

## 2013-01-18 DIAGNOSIS — Z79899 Other long term (current) drug therapy: Secondary | ICD-10-CM | POA: Diagnosis not present

## 2013-01-18 DIAGNOSIS — E1129 Type 2 diabetes mellitus with other diabetic kidney complication: Secondary | ICD-10-CM | POA: Diagnosis not present

## 2013-01-18 DIAGNOSIS — N182 Chronic kidney disease, stage 2 (mild): Secondary | ICD-10-CM | POA: Diagnosis not present

## 2013-01-18 DIAGNOSIS — N959 Unspecified menopausal and perimenopausal disorder: Secondary | ICD-10-CM | POA: Diagnosis not present

## 2013-01-18 DIAGNOSIS — R109 Unspecified abdominal pain: Secondary | ICD-10-CM | POA: Diagnosis not present

## 2013-01-22 DIAGNOSIS — R109 Unspecified abdominal pain: Secondary | ICD-10-CM | POA: Diagnosis not present

## 2013-01-22 DIAGNOSIS — E7800 Pure hypercholesterolemia, unspecified: Secondary | ICD-10-CM | POA: Diagnosis not present

## 2013-01-22 DIAGNOSIS — E8881 Metabolic syndrome: Secondary | ICD-10-CM | POA: Diagnosis not present

## 2013-02-15 DIAGNOSIS — H10409 Unspecified chronic conjunctivitis, unspecified eye: Secondary | ICD-10-CM | POA: Diagnosis not present

## 2013-03-08 DIAGNOSIS — H10409 Unspecified chronic conjunctivitis, unspecified eye: Secondary | ICD-10-CM | POA: Diagnosis not present

## 2013-05-19 DIAGNOSIS — E1129 Type 2 diabetes mellitus with other diabetic kidney complication: Secondary | ICD-10-CM | POA: Diagnosis not present

## 2013-05-19 DIAGNOSIS — I129 Hypertensive chronic kidney disease with stage 1 through stage 4 chronic kidney disease, or unspecified chronic kidney disease: Secondary | ICD-10-CM | POA: Diagnosis not present

## 2013-05-19 DIAGNOSIS — Z79899 Other long term (current) drug therapy: Secondary | ICD-10-CM | POA: Diagnosis not present

## 2013-05-19 DIAGNOSIS — N182 Chronic kidney disease, stage 2 (mild): Secondary | ICD-10-CM | POA: Diagnosis not present

## 2013-05-25 DIAGNOSIS — E559 Vitamin D deficiency, unspecified: Secondary | ICD-10-CM | POA: Diagnosis not present

## 2013-06-14 DIAGNOSIS — R059 Cough, unspecified: Secondary | ICD-10-CM | POA: Diagnosis not present

## 2013-06-14 DIAGNOSIS — J309 Allergic rhinitis, unspecified: Secondary | ICD-10-CM | POA: Diagnosis not present

## 2013-06-14 DIAGNOSIS — I10 Essential (primary) hypertension: Secondary | ICD-10-CM | POA: Diagnosis not present

## 2013-06-14 DIAGNOSIS — R05 Cough: Secondary | ICD-10-CM | POA: Diagnosis not present

## 2013-07-27 DIAGNOSIS — K219 Gastro-esophageal reflux disease without esophagitis: Secondary | ICD-10-CM | POA: Diagnosis not present

## 2013-09-01 DIAGNOSIS — E669 Obesity, unspecified: Secondary | ICD-10-CM | POA: Diagnosis not present

## 2013-09-01 DIAGNOSIS — K219 Gastro-esophageal reflux disease without esophagitis: Secondary | ICD-10-CM | POA: Diagnosis not present

## 2013-09-01 DIAGNOSIS — J3489 Other specified disorders of nose and nasal sinuses: Secondary | ICD-10-CM | POA: Diagnosis not present

## 2013-09-27 DIAGNOSIS — J069 Acute upper respiratory infection, unspecified: Secondary | ICD-10-CM | POA: Diagnosis not present

## 2013-09-27 DIAGNOSIS — J209 Acute bronchitis, unspecified: Secondary | ICD-10-CM | POA: Diagnosis not present

## 2013-09-27 DIAGNOSIS — H109 Unspecified conjunctivitis: Secondary | ICD-10-CM | POA: Diagnosis not present

## 2013-10-01 DIAGNOSIS — J069 Acute upper respiratory infection, unspecified: Secondary | ICD-10-CM | POA: Diagnosis not present

## 2013-10-04 DIAGNOSIS — J309 Allergic rhinitis, unspecified: Secondary | ICD-10-CM | POA: Diagnosis not present

## 2013-10-04 DIAGNOSIS — N182 Chronic kidney disease, stage 2 (mild): Secondary | ICD-10-CM | POA: Diagnosis not present

## 2013-10-04 DIAGNOSIS — E669 Obesity, unspecified: Secondary | ICD-10-CM | POA: Diagnosis not present

## 2013-10-04 DIAGNOSIS — E1129 Type 2 diabetes mellitus with other diabetic kidney complication: Secondary | ICD-10-CM | POA: Diagnosis not present

## 2013-10-04 DIAGNOSIS — E559 Vitamin D deficiency, unspecified: Secondary | ICD-10-CM | POA: Diagnosis not present

## 2013-10-04 DIAGNOSIS — Z0000 Encounter for general adult medical examination without abnormal findings: Secondary | ICD-10-CM | POA: Diagnosis not present

## 2013-10-04 DIAGNOSIS — Z0100 Encounter for examination of eyes and vision without abnormal findings: Secondary | ICD-10-CM | POA: Diagnosis not present

## 2013-10-04 DIAGNOSIS — Z6832 Body mass index (BMI) 32.0-32.9, adult: Secondary | ICD-10-CM | POA: Diagnosis not present

## 2013-10-04 DIAGNOSIS — Z0110 Encounter for examination of ears and hearing without abnormal findings: Secondary | ICD-10-CM | POA: Diagnosis not present

## 2013-10-04 DIAGNOSIS — I129 Hypertensive chronic kidney disease with stage 1 through stage 4 chronic kidney disease, or unspecified chronic kidney disease: Secondary | ICD-10-CM | POA: Diagnosis not present

## 2013-10-04 DIAGNOSIS — R05 Cough: Secondary | ICD-10-CM | POA: Diagnosis not present

## 2013-10-04 DIAGNOSIS — R059 Cough, unspecified: Secondary | ICD-10-CM | POA: Diagnosis not present

## 2013-10-07 DIAGNOSIS — Z23 Encounter for immunization: Secondary | ICD-10-CM | POA: Diagnosis not present

## 2013-10-11 DIAGNOSIS — Z23 Encounter for immunization: Secondary | ICD-10-CM | POA: Diagnosis not present

## 2013-10-14 DIAGNOSIS — Z1231 Encounter for screening mammogram for malignant neoplasm of breast: Secondary | ICD-10-CM | POA: Diagnosis not present

## 2014-02-03 DIAGNOSIS — R7309 Other abnormal glucose: Secondary | ICD-10-CM | POA: Diagnosis not present

## 2014-02-03 DIAGNOSIS — R635 Abnormal weight gain: Secondary | ICD-10-CM | POA: Diagnosis not present

## 2014-02-03 DIAGNOSIS — R079 Chest pain, unspecified: Secondary | ICD-10-CM | POA: Diagnosis not present

## 2014-02-03 DIAGNOSIS — IMO0001 Reserved for inherently not codable concepts without codable children: Secondary | ICD-10-CM | POA: Diagnosis not present

## 2014-02-03 DIAGNOSIS — J42 Unspecified chronic bronchitis: Secondary | ICD-10-CM | POA: Diagnosis not present

## 2014-02-28 ENCOUNTER — Other Ambulatory Visit (INDEPENDENT_AMBULATORY_CARE_PROVIDER_SITE_OTHER): Payer: Medicare Other

## 2014-02-28 ENCOUNTER — Ambulatory Visit (INDEPENDENT_AMBULATORY_CARE_PROVIDER_SITE_OTHER)
Admission: RE | Admit: 2014-02-28 | Discharge: 2014-02-28 | Disposition: A | Discharge status: Home / Self Care | Payer: Medicare Other | Source: Ambulatory Visit | Attending: Internal Medicine | Admitting: Internal Medicine

## 2014-02-28 ENCOUNTER — Ambulatory Visit (INDEPENDENT_AMBULATORY_CARE_PROVIDER_SITE_OTHER): Payer: Medicare Other | Admitting: Internal Medicine

## 2014-02-28 ENCOUNTER — Encounter: Payer: Self-pay | Admitting: Internal Medicine

## 2014-02-28 ENCOUNTER — Encounter (INDEPENDENT_AMBULATORY_CARE_PROVIDER_SITE_OTHER): Payer: Self-pay

## 2014-02-28 VITALS — BP 126/74 | HR 118 | Ht 66.0 in | Wt 205.8 lb

## 2014-02-28 DIAGNOSIS — J440 Chronic obstructive pulmonary disease with acute lower respiratory infection: Secondary | ICD-10-CM

## 2014-02-28 DIAGNOSIS — J309 Allergic rhinitis, unspecified: Secondary | ICD-10-CM

## 2014-02-28 DIAGNOSIS — R0609 Other forms of dyspnea: Secondary | ICD-10-CM | POA: Diagnosis not present

## 2014-02-28 DIAGNOSIS — J42 Unspecified chronic bronchitis: Secondary | ICD-10-CM | POA: Diagnosis not present

## 2014-02-28 DIAGNOSIS — J3089 Other allergic rhinitis: Secondary | ICD-10-CM

## 2014-02-28 DIAGNOSIS — R0989 Other specified symptoms and signs involving the circulatory and respiratory systems: Secondary | ICD-10-CM

## 2014-02-28 DIAGNOSIS — J302 Other seasonal allergic rhinitis: Secondary | ICD-10-CM

## 2014-02-28 DIAGNOSIS — R0600 Dyspnea, unspecified: Secondary | ICD-10-CM

## 2014-02-28 LAB — BRAIN NATRIURETIC PEPTIDE: Pro B Natriuretic peptide (BNP): 5 pg/mL (ref 0.0–100.0)

## 2014-02-28 NOTE — Patient Instructions (Signed)
Sample Anoro inhaler 1 puff, once daily  Order- schedule PFT  Dx chronic bronchitis  Order- CXR dx chronic bronchitis              Lab- Allergy Profile- dx chronic bronchitis                      BNP dx dyspnea

## 2014-02-28 NOTE — Progress Notes (Signed)
02/28/14- 56 yoF former smoker Self referral d/t tickle in throat and constant cough x 4-5 years.  Referred courtesy of Dr [facility]. Cough worse x 5 months. Productive white sputum. Cough is usually intermittent but this year persistent since October. Throat gurgle bothers her at night. Occasional nasal stuffiness. History of seasonal spring and fall rhinitis. Hydrocodone cough syrup helped, but does not want to depend on it. Advair did not help. Gastroenterology has diagnosed GERD but she says upper and lower GI were negative. History of abdominal hernia. She thinks part of her problem is pressure on this hernia when she bends over  ENT workup negative, skin test negative. Antibiotics did not help in September.. Chest feels tight/heavy. Worse in cold or with exercise. Not short of breath. Pending cardiology appointment. CXR 07/11/11 IMPRESSION:  The minimal fibrotic change in the left base appears stable  compared to previous CT.  There are slight increased pulmonary markings within the right  inferior perihilar region with minimal central peribronchial  thickening. No consolidation or pleural effusion is evident.  Peribronchial thickening may be associated with bronchitis, asthma,  and reactive airway disease.  Original Report Authenticated By: Delane Ginger, M.D.  Prior to Admission medications   Medication Sig Start Date End Date Taking? Authorizing Provider  aspirin 81 MG tablet Take 81 mg by mouth daily. Taking 2 daily   Yes Historical Provider, MD  Cholecalciferol (VITAMIN D3) 2000 UNITS TABS Take 2,000 Units by mouth daily.   Yes Historical Provider, MD  dexlansoprazole (DEXILANT) 60 MG capsule Take 60 mg by mouth daily.   Yes Historical Provider, MD  Multiple Minerals (CALCIUM-MAGNESIUM-ZINC) TABS Take 1 tablet by mouth daily.   Yes Historical Provider, MD  rosuvastatin (CRESTOR) 20 MG tablet Take 20 mg by mouth daily.   Yes Historical Provider, MD  sitaGLIPtin (JANUVIA) 100 MG tablet Take 100  mg by mouth daily.   Yes Historical Provider, MD  chlorpheniramine-HYDROcodone (TUSSIONEX PENNKINETIC ER) 10-8 MG/5ML LQCR Take 5 mLs by mouth every 12 (twelve) hours as needed for cough. 03/10/14   Deneise Lever, MD  Umeclidinium-Vilanterol (ANORO ELLIPTA) 62.5-25 MCG/INH AEPB Inhale 1 puff into the lungs daily. 03/04/14   Deneise Lever, MD   Past Medical History  Diagnosis Date  . High cholesterol   . Diabetes    Past Surgical History  Procedure Laterality Date  . Colonoscopy  2014  . Carpel tunnel surgery  2006  . Endoscopy  2014  . Abdominal hysterectomy  1995  . Colostomy  2007  . Colostomy let down  2008   Family History  Problem Relation Age of Onset  . Heart attack Father   . Diabetes Father   . Diabetes Brother    History   Social History  . Marital Status: Married    Spouse Name: N/A    Number of Children: N/A  . Years of Education: N/A   Occupational History  . Retired     Former Advertising copywriter to Scientist, physiological of education   Social History Main Topics  . Smoking status: Former Smoker -- 0.25 packs/day for 42 years    Types: Cigarettes    Quit date: 01/30/2006  . Smokeless tobacco: Not on file  . Alcohol Use: No  . Drug Use: Not on file  . Sexual Activity: Not on file   Other Topics Concern  . Not on file   Social History Narrative  . No narrative on file   ROS-see HPI Constitutional:   No-  weight loss, night sweats, fevers, chills, fatigue, lassitude. HEENT:   No-  headaches, difficulty swallowing, tooth/dental problems, sore throat,       No-  sneezing, itching, ear ache, +nasal congestion, +post nasal drip,  CV:  No-   chest pain, orthopnea, PND, swelling in lower extremities, anasarca, dizziness, palpitations Resp: + shortness of breath with exertion or at rest.              No-   productive cough,  No non-productive cough,  No- coughing up of blood.              No-   change in color of mucus.  No- wheezing.   Skin: No-   rash or lesions. GI:   No-   heartburn, indigestion, abdominal pain, nausea, vomiting, diarrhea,                 change in bowel habits, loss of appetite GU: No-   dysuria, change in color of urine, no urgency or frequency.  No- flank pain. MS:  No-   joint pain or swelling.  No- decreased range of motion.  No- back pain. Neuro-     nothing unusual Psych:  No- change in mood or affect. No depression or anxiety.  No memory loss.  OBJ- Physical Exam General- Alert, Oriented, Affect-appropriate, Distress- none acute Skin- rash-none, lesions- none, excoriation- none Lymphadenopathy- none Head- atraumatic            Eyes- Gross vision intact, PERRLA, conjunctivae and secretions clear            Ears- Hearing, canals-normal            Nose- Clear, no-Septal dev, mucus, polyps, erosion, perforation             Throat- Mallampati III , mucosa clear , drainage- none, tonsils- atrophic Neck- flexible , trachea midline, no stridor , thyroid nl, carotid no bruit Chest - symmetrical excursion , unlabored           Heart/CV- RRR , no murmur , no gallop  , no rub, nl s1 s2                           - JVD- none , edema- none, stasis changes- none, varices- none           Lung- clear to P&A, wheeze+in bases, cough- none , dullness-none, rub- none           Chest wall-  Abd- tender-no, distended-no, bowel sounds-present, HSM- no. L abd wall hernia. Br/ Gen/ Rectal- Not done, not indicated Extrem- cyanosis- none, clubbing, none, atrophy- none, strength- nl Neuro- grossly intact to observation

## 2014-03-01 LAB — ALLERGY FULL PROFILE
Allergen, D pternoyssinus,d7: <0.1 kU/L
Allergen,Goose feathers, e70: <0.1 kU/L
Alternaria Alternata: <0.1 kU/L
Aspergillus fumigatus, m3: <0.1 kU/L
Bahia Grass: <0.1 kU/L
Bermuda Grass: <0.1 kU/L
Box Elder IgE: <0.1 kU/L
Candida Albicans: <0.1 kU/L
Cat Dander: <0.1 kU/L
Common Ragweed: <0.1 kU/L
Curvularia lunata: <0.1 kU/L
D. farinae: <0.1 kU/L
Dog Dander: <0.1 kU/L
Elm IgE: <0.1 kU/L
Fescue: <0.1 kU/L
G005 Rye, Perennial: <0.1 kU/L
G009 Red Top: <0.1 kU/L
Goldenrod: <0.1 kU/L
Helminthosporium halodes: <0.1 kU/L
House Dust Hollister: <0.1 kU/L
IgE (Immunoglobulin E), Serum: 18.5 IU/mL (ref 0.0–180.0)
Lamb's Quarters: <0.1 kU/L
Oak: <0.1 kU/L
Plantain: <0.1 kU/L
Stemphylium Botryosum: <0.1 kU/L
Sycamore Tree: <0.1 kU/L
Timothy Grass: <0.1 kU/L

## 2014-03-02 ENCOUNTER — Other Ambulatory Visit: Payer: Self-pay | Admitting: Internal Medicine

## 2014-03-02 ENCOUNTER — Other Ambulatory Visit: Payer: Medicare Other

## 2014-03-02 DIAGNOSIS — J42 Unspecified chronic bronchitis: Secondary | ICD-10-CM

## 2014-03-02 DIAGNOSIS — D869 Sarcoidosis, unspecified: Secondary | ICD-10-CM

## 2014-03-03 LAB — ANGIOTENSIN CONVERTING ENZYME: Angiotensin-Converting Enzyme: 24 U/L (ref 8–52)

## 2014-03-04 ENCOUNTER — Telehealth: Payer: Self-pay | Admitting: Internal Medicine

## 2014-03-04 MED ORDER — UMECLIDINIUM-VILANTEROL 62.5-25 MCG/INH IN AEPB: 1.0000 | INHALATION_SPRAY | Freq: Every day | RESPIRATORY_TRACT | Status: DC

## 2014-03-04 NOTE — Telephone Encounter (Signed)
Pt states she feels anoro has helped with her cough and would like a Rx for this. Pt aware that Rx has been sent to [facility].

## 2014-03-09 ENCOUNTER — Ambulatory Visit (INDEPENDENT_AMBULATORY_CARE_PROVIDER_SITE_OTHER)
Admission: RE | Admit: 2014-03-09 | Discharge: 2014-03-09 | Disposition: A | Discharge status: Home / Self Care | Payer: Medicare Other | Source: Ambulatory Visit | Attending: Internal Medicine | Admitting: Internal Medicine

## 2014-03-09 DIAGNOSIS — D869 Sarcoidosis, unspecified: Secondary | ICD-10-CM

## 2014-03-09 DIAGNOSIS — J984 Other disorders of lung: Secondary | ICD-10-CM | POA: Diagnosis not present

## 2014-03-10 ENCOUNTER — Telehealth: Payer: Self-pay | Admitting: Internal Medicine

## 2014-03-10 ENCOUNTER — Encounter: Payer: Self-pay | Admitting: Internal Medicine

## 2014-03-10 MED ORDER — HYDROCOD POLST-CHLORPHEN POLST 10-8 MG/5ML PO LQCR: 5.0000 mL | Freq: Two times a day (BID) | ORAL | Status: DC | PRN

## 2014-03-10 NOTE — Telephone Encounter (Signed)
Pt calling requesting something be done for her cough.  She had emailed this morning and had not received a response and was getting frustrated. Pt states that she is having a lot of trouble with her Increased cough and is wanting her PFT moved closer that 04/11/14. Her appt for PFT has been moved to 03/17/14 per pt request at 4pm and appt with CDY has been left at 04/11/14 for the time being(unless CDY requests otherwise) Pt is requesting something be given to her for her cough in the meantime up until the date of her PFT. No Known Allergies  Dr Annamaria Boots please advise if anything can be done for the patient. Thanks!                                                          Medication List   aspirin 81 MG tablet  Take 81 mg by mouth daily. Taking 2 daily     Calcium-Magnesium-Zinc Tabs  Take 1 tablet by mouth daily.     dexlansoprazole 60 MG capsule  Commonly known as:  DEXILANT  Take 60 mg by mouth daily.     HYDROcodone-homatropine 5-1.5 MG/5ML syrup  Commonly known as:  HYCODAN  Take 5 mLs by mouth every 4 (four) hours as needed for cough.     rosuvastatin 20 MG tablet  Commonly known as:  CRESTOR  Take 20 mg by mouth daily.     sitaGLIPtin 100 MG tablet  Commonly known as:  JANUVIA  Take 100 mg by mouth daily.     Umeclidinium-Vilanterol 62.5-25 MCG/INH Aepb  Commonly known as:  ANORO ELLIPTA  Inhale 1 puff into the lungs daily.     Vitamin D3 2000 UNITS Tabs  Take 2,000 Units by mouth daily.

## 2014-03-10 NOTE — Telephone Encounter (Signed)
Per CY: try changing Hycodan to Tussionex 130mL 1tsp q12h prn for cough.  See if this works better.  Rx printed, signed by CY and taken to triage.  Thanks!

## 2014-03-10 NOTE — Telephone Encounter (Signed)
I called and made pt aware of recs. RX left for pick up

## 2014-03-14 DIAGNOSIS — E782 Mixed hyperlipidemia: Secondary | ICD-10-CM | POA: Diagnosis not present

## 2014-03-14 DIAGNOSIS — R079 Chest pain, unspecified: Secondary | ICD-10-CM | POA: Diagnosis not present

## 2014-03-14 DIAGNOSIS — R0609 Other forms of dyspnea: Secondary | ICD-10-CM | POA: Diagnosis not present

## 2014-03-17 ENCOUNTER — Ambulatory Visit (INDEPENDENT_AMBULATORY_CARE_PROVIDER_SITE_OTHER): Payer: Medicare Other | Admitting: Internal Medicine

## 2014-03-17 DIAGNOSIS — J42 Unspecified chronic bronchitis: Secondary | ICD-10-CM

## 2014-03-17 DIAGNOSIS — R0600 Dyspnea, unspecified: Secondary | ICD-10-CM

## 2014-03-17 LAB — PULMONARY FUNCTION TEST
DL/VA % pred: 77 %
DL/VA: 3.9 ml/min/mmHg/L
DLCO unc % pred: 56 %
DLCO unc: 15.24 ml/min/mmHg
FEF 25-75 Post: 0.66 L/sec
FEF 25-75 Pre: 0.66 L/sec
FEF2575-%Change-Post: 0 %
FEF2575-%Pred-Post: 33 %
FEF2575-%Pred-Pre: 33 %
FEV1-%Change-Post: 0 %
FEV1-%Pred-Post: 58 %
FEV1-%Pred-Pre: 58 %
FEV1-Post: 1.23 L
FEV1-Pre: 1.23 L
FEV1FVC-%Change-Post: -3 %
FEV1FVC-%Pred-Pre: 84 %
FEV6-%Change-Post: 1 %
FEV6-%Pred-Post: 72 %
FEV6-%Pred-Pre: 70 %
FEV6-Post: 1.88 L
FEV6-Pre: 1.85 L
FEV6FVC-%Change-Post: 0 %
FEV6FVC-%Pred-Post: 102 %
FEV6FVC-%Pred-Pre: 102 %
FVC-%Change-Post: 3 %
FVC-%Pred-Post: 71 %
FVC-%Pred-Pre: 69 %
FVC-Post: 1.93 L
FVC-Pre: 1.88 L
Post FEV1/FVC ratio: 63 %
Post FEV6/FVC ratio: 99 %
Pre FEV1/FVC ratio: 66 %
Pre FEV6/FVC Ratio: 98 %
RV % pred: 119 %
RV: 2.64 L
TLC % pred: 85 %
TLC: 4.57 L

## 2014-03-17 NOTE — Progress Notes (Signed)
PFT done today. 

## 2014-03-21 ENCOUNTER — Encounter: Payer: Self-pay | Admitting: Internal Medicine

## 2014-03-21 DIAGNOSIS — R079 Chest pain, unspecified: Secondary | ICD-10-CM | POA: Diagnosis not present

## 2014-03-21 DIAGNOSIS — J310 Chronic rhinitis: Secondary | ICD-10-CM | POA: Insufficient documentation

## 2014-03-21 DIAGNOSIS — J449 Chronic obstructive pulmonary disease, unspecified: Secondary | ICD-10-CM | POA: Insufficient documentation

## 2014-03-21 NOTE — Assessment & Plan Note (Signed)
Plan-allergy profile

## 2014-03-21 NOTE — Assessment & Plan Note (Signed)
She hasn't discomforts which may also be related to pressure on her abdominal hernia when she bends over Plan-schedule PFT, chest x-ray, allergy profile, B. Natruretic peptide

## 2014-03-22 ENCOUNTER — Encounter: Payer: Self-pay | Admitting: Internal Medicine

## 2014-03-23 NOTE — Telephone Encounter (Signed)
I will go over the test results in detail when she comes for follow-up. The PFT test shows that airflow through the tubes is slower than normal "obstructed" and the overall size of the lungs is a little shrunken, or "restricted". 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

## 2014-03-23 NOTE — Telephone Encounter (Signed)
Dr Annamaria Boots, please advise on PFT results thanks!

## 2014-03-24 ENCOUNTER — Encounter: Payer: Self-pay | Admitting: Internal Medicine

## 2014-03-31 NOTE — Progress Notes (Signed)
Quick Note:  [email or web address] ______

## 2014-04-04 ENCOUNTER — Telehealth: Payer: Self-pay | Admitting: Internal Medicine

## 2014-04-04 NOTE — Telephone Encounter (Signed)
I am unaware of any calls to her-I sent message to patient via my chart last week stating I would let her know about the link for questionnaires she was having trouble with. I am looking into this for the patient. Thanks.

## 2014-04-04 NOTE — Telephone Encounter (Signed)
No recent lab or imaging results where [name] called pt Called spoke with patient who stated that it was 4.2.15 around 4pm There is an email from pt w/ questions about the Patient Questionnaire  Tammy Jersey, do you recall calling pt last week?  Thanks.

## 2014-04-04 NOTE — Telephone Encounter (Signed)
lmomtcb for pt 

## 2014-04-04 NOTE — Telephone Encounter (Signed)
Spoke with the pt and notified of recs per Joellen Jersey  She verbalized understanding  Nothing further needed

## 2014-04-04 NOTE — Telephone Encounter (Signed)
Result Notes    Notes Recorded by Maurice March, RN on 03/31/2014 at 4:53 PM [email or web address] ------  Notes Recorded by Deneise Lever, MD on 03/17/2014 at 5:25 PM PFT shows moderate obstructive airways disease. We will discuss at next office visit.   Pt aware of results and to keep 04-11-14 appt with CY.

## 2014-04-07 DIAGNOSIS — R0989 Other specified symptoms and signs involving the circulatory and respiratory systems: Secondary | ICD-10-CM | POA: Diagnosis not present

## 2014-04-07 DIAGNOSIS — R0609 Other forms of dyspnea: Secondary | ICD-10-CM | POA: Diagnosis not present

## 2014-04-11 ENCOUNTER — Encounter: Payer: Self-pay | Admitting: Internal Medicine

## 2014-04-11 ENCOUNTER — Ambulatory Visit (INDEPENDENT_AMBULATORY_CARE_PROVIDER_SITE_OTHER): Payer: Medicare Other | Admitting: Internal Medicine

## 2014-04-11 VITALS — BP 132/72 | HR 88 | Ht 66.0 in

## 2014-04-11 DIAGNOSIS — J309 Allergic rhinitis, unspecified: Secondary | ICD-10-CM

## 2014-04-11 DIAGNOSIS — J302 Other seasonal allergic rhinitis: Secondary | ICD-10-CM

## 2014-04-11 DIAGNOSIS — J440 Chronic obstructive pulmonary disease with acute lower respiratory infection: Secondary | ICD-10-CM | POA: Diagnosis not present

## 2014-04-11 DIAGNOSIS — J3089 Other allergic rhinitis: Secondary | ICD-10-CM

## 2014-04-11 MED ORDER — METHYLPREDNISOLONE ACETATE 80 MG/ML IJ SUSP
80.0000 mg | Freq: Once | INTRAMUSCULAR | Status: AC
  Administered 2014-04-11: 80 mg via INTRAMUSCULAR

## 2014-04-11 MED ORDER — TIOTROPIUM BROMIDE MONOHYDRATE 18 MCG IN CAPS
18.0000 ug | ORAL_CAPSULE | Freq: Every day | RESPIRATORY_TRACT | Status: DC
Start: 2014-04-11 — End: 2014-04-25

## 2014-04-11 MED ORDER — MOMETASONE FURO-FORMOTEROL FUM 200-5 MCG/ACT IN AERO: 2.0000 | INHALATION_SPRAY | Freq: Two times a day (BID) | RESPIRATORY_TRACT | Status: DC

## 2014-04-11 MED ORDER — LEVALBUTEROL HCL 0.63 MG/3ML IN NEBU
0.6300 mg | INHALATION_SOLUTION | Freq: Once | RESPIRATORY_TRACT | Status: AC
  Administered 2014-04-11: 0.63 mg via RESPIRATORY_TRACT

## 2014-04-11 NOTE — Patient Instructions (Addendum)
Sample Dulera 200 inhaler   2 puffs then rinse mouth, twice daily maintenance  Sample Spiriva inhaler- 1 inhalation daily  Neb xop 0.65  Depo 80

## 2014-04-11 NOTE — Progress Notes (Signed)
02/28/14- 89 yoF former smoker Self referral d/t tickle in throat and constant cough x 4-5 years.  Referred courtesy of Dr [facility]. Cough worse x 5 months. Productive white sputum. Cough is usually intermittent but this year persistent since October. Throat gurgle bothers her at night. Occasional nasal stuffiness. History of seasonal spring and fall rhinitis. Hydrocodone cough syrup helped, but does not want to depend on it. Advair did not help. Gastroenterology has diagnosed GERD but she says upper and lower GI were negative. History of abdominal hernia. She thinks part of her problem is pressure on this hernia when she bends over  ENT workup negative, skin test negative. Antibiotics did not help in September.. Chest feels tight/heavy. Worse in cold or with exercise. Not short of breath. Pending cardiology appointment. CXR 07/11/11 IMPRESSION:  The minimal fibrotic change in the left base appears stable  compared to previous CT.  There are slight increased pulmonary markings within the right  inferior perihilar region with minimal central peribronchial  thickening. No consolidation or pleural effusion is evident.  Peribronchial thickening may be associated with bronchitis, asthma,  and reactive airway disease.  Original Report Authenticated By: Delane Ginger, M.D.  04/11/14- 58 yoF former smoker Self referral d/t tickle in throat and constant cough x 4-5 years complicated by GERD/ abdominal hernia FOLLOWS FOR: Pt states her breathing has improved since last OV. Mild SOB with activity. Pt c/o persistant cough with clear and white mucous. Denies CP.  She does not think pollen causes her cough because is nonseasonal. Anoro made cough worse.  Feels tight in stomach especially if she presses on her abdomen picking up her daughter. Aware of abdominal hernia and blames this for her cough. Saw cardiologist, pending followup after echocardiogram and stress test. PFT: 03/17/2014-moderate severe obstructive  disease with insignificant response to bronchodilator. Lab 32 20/15- BNP- 5.0, ACE 24 Allergy profile 02/28/14- negative, total IgE 18.5 with no specific elevations CT 03/09/14 IMPRESSION  1. No findings to explain the patient's shortness of breath.  2. No pulmonary parenchymal findings of sarcoid.  3. Three-vessel coronary artery calcification.  SIGNATURE  Electronically Signed  By: Lorin Picket M.D.  On: 03/09/2014 13:36   ROS-see HPI Constitutional:   No-   weight loss, night sweats, fevers, chills, fatigue, lassitude. HEENT:   No-  headaches, difficulty swallowing, tooth/dental problems, sore throat,       No-  sneezing, itching, ear ache, +nasal congestion, +post nasal drip,  CV:  No-   chest pain, orthopnea, PND, swelling in lower extremities, anasarca, dizziness, palpitations Resp: + shortness of breath with exertion or at rest.              No-   productive cough,  + non-productive cough,  No- coughing up of blood.              No-   change in color of mucus.  No- wheezing.   Skin: No-   rash or lesions. GI:  No-   heartburn, indigestion, abdominal pain, nausea, vomiting,  GU: No-   dysuria, change in color of urine,  MS:  No-   joint pain or swelling.  . Neuro-     nothing unusual Psych:  No- change in mood or affect. No depression or anxiety.  No memory loss.  OBJ- Physical Exam General- Alert, Oriented, Affect-appropriate, Distress- none acute Skin- rash-none, lesions- none, excoriation- none Lymphadenopathy- none Head- atraumatic            Eyes- Gross vision  intact, PERRLA, conjunctivae and secretions clear            Ears- Hearing, canals-normal            Nose- Clear, no-Septal dev, mucus, polyps, erosion, perforation             Throat- Mallampati III , mucosa clear , drainage- none, tonsils- atrophic Neck- flexible , trachea midline, no stridor , thyroid nl, carotid no bruit Chest - symmetrical excursion , unlabored           Heart/CV- RRR , no murmur , no  gallop  , no rub, nl s1 s2                           - JVD- none , edema- none, stasis changes- none, varices- none           Lung-  wheeze+in bases, cough- none , dullness-none, rub- none           Chest wall-  Abd- +. L abd wall hernia. Br/ Gen/ Rectal- Not done, not indicated Extrem- cyanosis- none, clubbing, none, atrophy- none, strength- nl Neuro- grossly intact to observation

## 2014-04-12 ENCOUNTER — Telehealth: Payer: Self-pay | Admitting: Internal Medicine

## 2014-04-12 NOTE — Telephone Encounter (Signed)
Spoke with pt and advised her on how to use spiriva and dulera.  She had confusion on how many puffs and how many times a day for these inhalers. spiriva x1 puff qd, dulera x2 puffs bid She verbalized understanding and had no further questions

## 2014-04-13 DIAGNOSIS — R0609 Other forms of dyspnea: Secondary | ICD-10-CM | POA: Diagnosis not present

## 2014-04-13 DIAGNOSIS — R079 Chest pain, unspecified: Secondary | ICD-10-CM | POA: Diagnosis not present

## 2014-04-13 DIAGNOSIS — E782 Mixed hyperlipidemia: Secondary | ICD-10-CM | POA: Diagnosis not present

## 2014-04-13 DIAGNOSIS — I519 Heart disease, unspecified: Secondary | ICD-10-CM | POA: Diagnosis not present

## 2014-04-21 ENCOUNTER — Telehealth: Payer: Self-pay | Admitting: Internal Medicine

## 2014-04-21 NOTE — Telephone Encounter (Signed)
Spoke with the pt to verify the msg  She reports that the spiriva and dulera 200 mg have helped her breathing and she is requesting rxs for these  Rxs sent to pharm  Nothing further needed per pt

## 2014-04-25 ENCOUNTER — Telehealth: Payer: Self-pay | Admitting: Internal Medicine

## 2014-04-25 MED ORDER — TIOTROPIUM BROMIDE MONOHYDRATE 18 MCG IN CAPS: 18.0000 ug | ORAL_CAPSULE | Freq: Every day | RESPIRATORY_TRACT | Status: DC

## 2014-04-25 MED ORDER — MOMETASONE FURO-FORMOTEROL FUM 200-5 MCG/ACT IN AERO: 2.0000 | INHALATION_SPRAY | Freq: Two times a day (BID) | RESPIRATORY_TRACT | Status: DC

## 2014-04-25 NOTE — Telephone Encounter (Signed)
Spoke with patient-aware that Rx's have been sent to the pharmacy and our apologies that they were not sent the first time. Nothing more needed at this time.

## 2014-04-28 ENCOUNTER — Telehealth: Payer: Self-pay | Admitting: Internal Medicine

## 2014-04-28 ENCOUNTER — Encounter: Payer: Self-pay | Admitting: Internal Medicine

## 2014-04-28 MED ORDER — PROMETHAZINE-CODEINE 6.25-10 MG/5ML PO SYRP: 5.0000 mL | ORAL_SOLUTION | Freq: Four times a day (QID) | ORAL | Status: DC | PRN

## 2014-04-28 MED ORDER — AMOXICILLIN-POT CLAVULANATE 875-125 MG PO TABS: 1.0000 | ORAL_TABLET | Freq: Two times a day (BID) | ORAL | Status: DC

## 2014-04-28 NOTE — Telephone Encounter (Signed)
No Known Allergies   Current Outpatient Prescriptions on File Prior to Visit  Medication Sig Dispense Refill  . aspirin 81 MG tablet Take 81 mg by mouth daily. Taking 2 daily      . chlorpheniramine-HYDROcodone (TUSSIONEX PENNKINETIC ER) 10-8 MG/5ML LQCR Take 5 mLs by mouth every 12 (twelve) hours as needed for cough.  150 mL  0  . Cholecalciferol (VITAMIN D3) 2000 UNITS TABS Take 2,000 Units by mouth daily.      . mometasone-formoterol (DULERA) 200-5 MCG/ACT AERO Inhale 2 puffs into the lungs 2 (two) times daily.  1 Inhaler  6  . Multiple Minerals (CALCIUM-MAGNESIUM-ZINC) TABS Take 1 tablet by mouth daily.      . rosuvastatin (CRESTOR) 20 MG tablet Take 20 mg by mouth daily.      . sitaGLIPtin (JANUVIA) 100 MG tablet Take 100 mg by mouth daily.      Marland Kitchen tiotropium (SPIRIVA) 18 MCG inhalation capsule Place 1 capsule (18 mcg total) into inhaler and inhale daily.  30 capsule  6  . Umeclidinium-Vilanterol (ANORO ELLIPTA) 62.5-25 MCG/INH AEPB Inhale 1 puff into the lungs daily.  1 each  6   No current facility-administered medications on file prior to visit.

## 2014-04-28 NOTE — Telephone Encounter (Signed)
Sent email back to pt to make her aware of meds sent to the pharmacy.  [name] has called in the cough meds and sent the abx into the pharmacy.  Nothing further is needed.

## 2014-04-28 NOTE — Telephone Encounter (Signed)
spokew with pt. Aware we do not have any cards for spiriva. Nothing further needed

## 2014-04-28 NOTE — Telephone Encounter (Signed)
Offer prometh codeine 200 ml, 5 ml every 6 hours as needed for cough  Augmentin 875 mg, # 114, 1 twice daily  Pick up sample Dymista nasal spray  1-2 puffs each nostril, once daily at bedtime

## 2014-04-28 NOTE — Addendum Note (Signed)
Addended by: Inge Rise on: 04/28/2014 05:32 PM   Modules accepted: Orders

## 2014-04-29 ENCOUNTER — Telehealth: Payer: Self-pay | Admitting: Internal Medicine

## 2014-04-29 MED ORDER — AZELASTINE-FLUTICASONE 137-50 MCG/ACT NA SUSP: 1.0000 | Freq: Every day | NASAL | Status: DC

## 2014-04-29 NOTE — Telephone Encounter (Signed)
Spouse came by office to pick up dymista sample as directed in pt email yesterday.   Sample not at front. I spoke with DY: pt can use this 1-2 sprays in each nostril qhs. Spouse has been given sample and is aware of instructions.  Nothing further needed.

## 2014-05-03 ENCOUNTER — Encounter: Payer: Self-pay | Admitting: Internal Medicine

## 2014-05-04 ENCOUNTER — Encounter: Payer: Self-pay | Admitting: Internal Medicine

## 2014-05-04 ENCOUNTER — Ambulatory Visit (INDEPENDENT_AMBULATORY_CARE_PROVIDER_SITE_OTHER): Payer: Medicare Other | Admitting: Internal Medicine

## 2014-05-04 VITALS — BP 140/82 | HR 118 | Ht 66.0 in | Wt 202.0 lb

## 2014-05-04 DIAGNOSIS — J310 Chronic rhinitis: Secondary | ICD-10-CM

## 2014-05-04 DIAGNOSIS — J449 Chronic obstructive pulmonary disease, unspecified: Secondary | ICD-10-CM | POA: Diagnosis not present

## 2014-05-04 MED ORDER — MONTELUKAST SODIUM 10 MG PO TABS
10.0000 mg | ORAL_TABLET | Freq: Every day | ORAL | Status: DC
Start: 2014-05-04 — End: 2015-05-25

## 2014-05-04 MED ORDER — IPRATROPIUM BROMIDE 0.06 % NA SOLN: NASAL | Status: DC

## 2014-05-04 NOTE — Patient Instructions (Addendum)
Script to add Ipratropium nasal spray   1-2 puffs each nostril, 4 times daily as needed      Continue the Dymista spray  Script to add Singulair/ montelukast  1 daily- airway anti-inflammatory  Finish the augmentin antibiotic  Continue the Dulera 2 puffs, then rinse mouth, twice daily  Continue Spiriva 1 daily

## 2014-05-04 NOTE — Progress Notes (Signed)
02/28/14- 53 yoF former smoker Self referral d/t tickle in throat and constant cough x 4-5 years.  Referred courtesy of Dr [facility]aird [facility]. Cough worse x 5 months. Productive white sputum. Cough is usually intermittent but this year persistent since October. Throat gurgle bothers her at night. Occasional nasal stuffiness. History of seasonal spring and fall rhinitis. Hydrocodone cough syrup helped, but does not want to depend on it. Advair did not help. Gastroenterology has diagnosed GERD but she says upper and lower GI were negative. History of abdominal hernia. She thinks part of her problem is pressure on this hernia when she bends over  ENT workup negative, skin test negative. Antibiotics did not help in September.. Chest feels tight/heavy. Worse in cold or with exercise. Not short of breath. Pending cardiology appointment. CXR 07/11/11 IMPRESSION:  The minimal fibrotic change in the left base appears stable  compared to previous CT.  There are slight increased pulmonary markings within the right  inferior perihilar region with minimal central peribronchial  thickening. No consolidation or pleural effusion is evident.  Peribronchial thickening may be associated with bronchitis, asthma,  and reactive airway disease.  Original Report Authenticated By: Delane Ginger, M.D.  04/11/14- 12 yoF former smoker Self referral d/t tickle in throat and constant cough x 4-5 years complicated by GERD/ abdominal hernia FOLLOWS FOR: Pt states her breathing has improved since last OV. Mild SOB with activity. Pt c/o persistant cough with clear and white mucous. Denies CP.   CT 03/09/14 IMPRESSION  1. No findings to explain the patient's shortness of breath.  2. No pulmonary parenchymal findings of sarcoid.  3. Three-vessel coronary artery calcification.  SIGNATURE  Electronically Signed  By: Lorin Picket M.D.  On: 03/09/2014 13:36   [birth year]-67 yoF former smoker Self referral d/t tickle in throat and constant cough x 4-5  years complicated by GERD/ abdominal hernia, CAD FOLLOWS FOR:  Still having cough with thick white mucus, wheezing.  Pt reports symptoms have gotten worse, on augmentin Myocardial Perfusion Imaging- Negative for ischemia, EF 50%. PFT-03/17/2014 moderately severe obstructive airways disease with insignificant response to bronchodilator,  moderate diffusion defect. FVC 1.93/71%, FEV1 1.23/58%, FEV1/FEC 0.63 , FEF 25-75% 0.66/33%, TLC 85%, DLCO 56%. Dymista did not help-much nasal mucus discharge. Has almost finished Augmentin with no effect. 2 episodes of hard coughing to breath away.says cough is due to throat tickle. Does not choke when she swallows. We discussed reflux as a basis for cough. She had upper and lower endoscopy/ [facility] in 2014 but does not know results. Had rhinoscopy by ENT.  ROS-see HPI Constitutional:   No-   weight loss, night sweats, fevers, chills, fatigue, lassitude. HEENT:   No-  headaches, difficulty swallowing, tooth/dental problems, sore throat,       No-  sneezing, itching, ear ache, +nasal congestion, +post nasal drip,  CV:  No-   chest pain, orthopnea, PND, swelling in lower extremities, anasarca, dizziness, palpitations Resp: + shortness of breath with exertion or at rest.              No-   productive cough,  + non-productive cough,  No- coughing up of blood.              No-   change in color of mucus.  No- wheezing.   Skin: No-   rash or lesions. GI:  No-   heartburn, indigestion, abdominal pain, nausea, vomiting,  GU:  MS:  No-   joint pain or swelling. . Neuro-  nothing unusual Psych:  No- change in mood or affect. No depression or anxiety.  No memory loss.  OBJ- Physical Exam General- Alert, Oriented, Affect-appropriate, Distress- none acute Skin- rash-none, lesions- none, excoriation- none Lymphadenopathy- none Head- atraumatic            Eyes- Gross vision intact, PERRLA, conjunctivae and secretions clear            Ears- Hearing, canals-normal             Nose- +sniffing, no-Septal dev, mucus, polyps, erosion, perforation             Throat- Mallampati III , mucosa clear , drainage- none, tonsils- atrophic Neck- flexible , trachea midline, no stridor , thyroid nl, carotid no bruit Chest - symmetrical excursion , unlabored           Heart/CV- RRR , no murmur , no gallop  , no rub, nl s1 s2                           - JVD- none , edema- none, stasis changes- none, varices- none           Lung- , wheeze+in bases, cough- none , dullness-none, rub- none           Chest wall-  Abd- tender-no, distended-no, bowel sounds-present, HSM- no. L abd wall hernia. Br/ Gen/ Rectal- Not done, not indicated Extrem- cyanosis- none, clubbing, none, atrophy- none, strength- nl Neuro- grossly intact to observation

## 2014-05-05 ENCOUNTER — Encounter: Payer: Self-pay | Admitting: Internal Medicine

## 2014-05-05 NOTE — Telephone Encounter (Signed)
Katie please advise if you have seen any medical records faxed over on this patient from Dr [facility] office.  Patient is requesting a call back once received.

## 2014-05-06 NOTE — Telephone Encounter (Signed)
Per MyChart chain with patient:   Fax located at [employer] place on CY's cart along with copy of this note

## 2014-05-08 NOTE — Assessment & Plan Note (Addendum)
Allergy profile 02/28/14- negative, total IgE 18.5 with no specific elevations Not sure if this relates to her cough Plan-watch response to Depo-Medrol

## 2014-05-08 NOTE — Assessment & Plan Note (Signed)
Moderate COPD. She did not respond to bronchodilators on PFT, but it's worth a try giving her samples of bronchodilators because I hear wheezing now. Plan-nebulizer Xopenex treatment, Depo-Medrol, sample Dulera, sample Spiriva

## 2014-05-12 ENCOUNTER — Encounter: Payer: Self-pay | Admitting: Internal Medicine

## 2014-05-12 NOTE — Telephone Encounter (Signed)
Dr Annamaria Boots please see MyChart message below: Update: the cough has lessened but it has not gone away. The tickle is still prominent and my nose still produces a lot of music. My voice is very raspy and has been so for about 10 days. I stopped taking Dulera because every time I took it, the tickle and coughing would start. There is still NOTHING that takes the tickle and cough away. The cough medicine that has helped me the most is Hydrocodone homatropine syrup (200 ml). The second best is Hydrocodone chlorphen ER Susp. I am requesting the first medication I listed, or something better or similar. Thank you. I can be reached at [phone].  Okay to leave detailed message per patient if okay to refill either meds. Patient aware that she will have to pick these up--will have her husband pick up in AM.  Please advise Dr Annamaria Boots. Thanks.

## 2014-05-13 ENCOUNTER — Encounter: Payer: Self-pay | Admitting: Internal Medicine

## 2014-05-13 MED ORDER — HYDROCODONE-HOMATROPINE 5-1.5 MG/5ML PO SYRP: 5.0000 mL | ORAL_SOLUTION | Freq: Four times a day (QID) | ORAL | Status: DC | PRN

## 2014-05-13 NOTE — Telephone Encounter (Addendum)
Per 4:22pm email from pt today: I have been calling your office and I'm unable to get through. I've been placed on hold for a long time with no response. I wish someone would have called me as I have been waiting all day. Is the prescription ready for pick up? At this hour, I will probably have to wait until Monday because no one is answering your phone.  ==================================== Rx has been done Called spoke with patient who reported that she was able to get thru to the office and was informed of the above - her husband is coming to the office to pick her rx.  Nothing further needed; will sign off.

## 2014-05-13 NOTE — Telephone Encounter (Signed)
Ok to refill the cough syrup she requests

## 2014-05-13 NOTE — Addendum Note (Signed)
Addended by: Desmond Dike C on: 05/13/2014 02:50 PM   Modules accepted: Orders

## 2014-05-18 ENCOUNTER — Encounter: Payer: Self-pay | Admitting: Internal Medicine

## 2014-05-18 NOTE — Telephone Encounter (Signed)
Glad the cough is finally better. I wonder if the steroid in the [facility] inhaler is causing a yeast overgrowth in the throat "thrush". I would like to change her Dulera 200 to [facility] 100- sample if available. Still 2 puffs, then rinse mouth, twice daily. Add a aerochamber spacer for use with the [facility]- need to teach. Send script for Diflucan 150 mg, # 5, 1 daily

## 2014-05-18 NOTE — Telephone Encounter (Signed)
Patient e-mailing requesting recommendations--  "Hello. The tickle and cough are just about gone but now my throat is hoarse almost like larangitis. It has been like this for 2 weeks. It doesn't seem to be getting better. My family is worried about me. Should I be concerned?"   Pt was started on new inhalers - Dulera/Spiriva 03/2014 OV-- no complaints at that time. No Known Allergies  Please advise Dr Annamaria Boots. Thanks.    Medication List       This list is accurate as of: 05/18/14  4:29 PM.  Always use your most recent med list.               amoxicillin-clavulanate 875-125 MG per tablet  Commonly known as:  AUGMENTIN  Take 1 tablet by mouth 2 (two) times daily.     aspirin 81 MG tablet  Take 81 mg by mouth daily. Taking 2 daily     Azelastine-Fluticasone 137-50 MCG/ACT Susp  Commonly known as:  DYMISTA  Place 1-2 sprays into the nose at bedtime.     Calcium-Magnesium-Zinc Tabs  Take 1 tablet by mouth daily.     HYDROcodone-homatropine 5-1.5 MG/5ML syrup  Commonly known as:  HYCODAN  Take 5 mLs by mouth every 6 (six) hours as needed for cough.     ipratropium 0.06 % nasal spray  Commonly known as:  ATROVENT  1-2 puffs each nostril up to 4 times daily as needed     mometasone-formoterol 200-5 MCG/ACT Aero  Commonly known as:  DULERA  Inhale 2 puffs into the lungs 2 (two) times daily.     montelukast 10 MG tablet  Commonly known as:  SINGULAIR  Take 1 tablet (10 mg total) by mouth at bedtime.     promethazine-codeine 6.25-10 MG/5ML syrup  Commonly known as:  PHENERGAN with CODEINE  Take 5 mLs by mouth every 6 (six) hours as needed for cough.     rosuvastatin 20 MG tablet  Commonly known as:  CRESTOR  Take 20 mg by mouth daily.     sitaGLIPtin 100 MG tablet  Commonly known as:  JANUVIA  Take 100 mg by mouth daily.     tiotropium 18 MCG inhalation capsule  Commonly known as:  SPIRIVA  Place 1 capsule (18 mcg total) into inhaler and inhale daily.     Vitamin D3  2000 UNITS Tabs  Take 2,000 Units by mouth daily.

## 2014-05-19 ENCOUNTER — Telehealth: Payer: Self-pay | Admitting: Internal Medicine

## 2014-05-19 NOTE — Telephone Encounter (Signed)
ATC PT NA. Line rang numerous times. No answer WCB

## 2014-05-19 NOTE — Telephone Encounter (Signed)
Deneise Lever, MD at 05/18/2014 9:01 PM    Status: Signed        Glad the cough is finally better. I wonder if the steroid in the [facility] inhaler is causing a yeast overgrowth in the throat "thrush". I would like to change her Dulera 200 to [facility] 100- sample if available. Still 2 puffs, then rinse mouth, twice daily.  Add a aerochamber spacer for use with the [facility]- need to teach.  Send script for Diflucan 150 mg, # 5, 1 daily

## 2014-05-19 NOTE — Telephone Encounter (Signed)
LMTCB

## 2014-05-19 NOTE — Telephone Encounter (Signed)
Spoke with the pt  She states that also her cough is better, she is having a lot of hoarseness and tickling feeling in her throat  She is asking for recs from [facility]  Please advise thanks Last ov 05/04/14 Next ov 06/15/14  No Known Allergies Current Outpatient Prescriptions on File Prior to Visit  Medication Sig Dispense Refill  . amoxicillin-clavulanate (AUGMENTIN) 875-125 MG per tablet Take 1 tablet by mouth 2 (two) times daily.  14 tablet  0  . aspirin 81 MG tablet Take 81 mg by mouth daily. Taking 2 daily      . Azelastine-Fluticasone (DYMISTA) 137-50 MCG/ACT SUSP Place 1-2 sprays into the nose at bedtime.  1 Bottle  0  . Cholecalciferol (VITAMIN D3) 2000 UNITS TABS Take 2,000 Units by mouth daily.      Marland Kitchen HYDROcodone-homatropine (HYCODAN) 5-1.5 MG/5ML syrup Take 5 mLs by mouth every 6 (six) hours as needed for cough.  240 mL  0  . ipratropium (ATROVENT) 0.06 % nasal spray 1-2 puffs each nostril up to 4 times daily as needed  15 mL  12  . mometasone-formoterol (DULERA) 200-5 MCG/ACT AERO Inhale 2 puffs into the lungs 2 (two) times daily.  1 Inhaler  6  . montelukast (SINGULAIR) 10 MG tablet Take 1 tablet (10 mg total) by mouth at bedtime.  30 tablet  11  . Multiple Minerals (CALCIUM-MAGNESIUM-ZINC) TABS Take 1 tablet by mouth daily.      . promethazine-codeine (PHENERGAN WITH CODEINE) 6.25-10 MG/5ML syrup Take 5 mLs by mouth every 6 (six) hours as needed for cough.  200 mL  0  . rosuvastatin (CRESTOR) 20 MG tablet Take 20 mg by mouth daily.      . sitaGLIPtin (JANUVIA) 100 MG tablet Take 100 mg by mouth daily.      Marland Kitchen tiotropium (SPIRIVA) 18 MCG inhalation capsule Place 1 capsule (18 mcg total) into inhaler and inhale daily.  30 capsule  6   No current facility-administered medications on file prior to visit.

## 2014-05-20 MED ORDER — MOMETASONE FURO-FORMOTEROL FUM 100-5 MCG/ACT IN AERO: 2.0000 | INHALATION_SPRAY | Freq: Two times a day (BID) | RESPIRATORY_TRACT | Status: DC

## 2014-05-20 MED ORDER — FLUCONAZOLE 150 MG PO TABS: 150.0000 mg | ORAL_TABLET | Freq: Every day | ORAL | Status: DC

## 2014-05-20 MED ORDER — AEROCHAMBER Z-STAT PLUS MISC: Status: DC

## 2014-05-20 NOTE — Telephone Encounter (Signed)
Pt advised and rx sent and samples left at front. Pt advised she will be taught how to use spacer when she picks up the samples. [place] Bing, CMA

## 2014-05-20 NOTE — Addendum Note (Signed)
Addended by: Lilli Few on: 05/20/2014 10:16 AM   Modules accepted: Orders

## 2014-05-24 ENCOUNTER — Ambulatory Visit: Payer: Medicare Other | Admitting: Internal Medicine

## 2014-06-12 NOTE — Assessment & Plan Note (Signed)
Exacerbation not relieved by Dymista Plan-try ipratropium nasal spray

## 2014-06-12 NOTE — Assessment & Plan Note (Signed)
Plan-Singulair, finish Augmentin.

## 2014-06-15 ENCOUNTER — Ambulatory Visit: Payer: Medicare Other | Admitting: Internal Medicine

## 2014-08-24 ENCOUNTER — Encounter: Payer: Self-pay | Admitting: Internal Medicine

## 2014-08-24 NOTE — Telephone Encounter (Signed)
8.26.15 mychart message from pt: Message     Had to cancel appointment in June. Am I due to see Dr. Annamaria Boots?      Pt last seen 5.6.15 by [facility] with recs to follow up in 6 weeks: Patient Instructions     Script to add Ipratropium nasal spray 1-2 puffs each nostril, 4 times daily as needed Continue the Dymista spray  Script to add Singulair/ montelukast 1 daily- airway anti-inflammatory  Finish the augmentin antibiotic  Continue the Dulera 2 puffs, then rinse mouth, twice daily  Continue Spiriva 1 daily   Message sent back to pt informing her that yes, she is due for follow up with CDY and to please call the office at her convenience to schedule this.

## 2014-09-06 DIAGNOSIS — I10 Essential (primary) hypertension: Secondary | ICD-10-CM | POA: Diagnosis not present

## 2014-09-06 DIAGNOSIS — R079 Chest pain, unspecified: Secondary | ICD-10-CM | POA: Diagnosis not present

## 2014-09-06 DIAGNOSIS — E782 Mixed hyperlipidemia: Secondary | ICD-10-CM | POA: Diagnosis not present

## 2014-09-12 ENCOUNTER — Ambulatory Visit (INDEPENDENT_AMBULATORY_CARE_PROVIDER_SITE_OTHER): Payer: Medicare Other | Admitting: Internal Medicine

## 2014-09-12 ENCOUNTER — Encounter: Payer: Self-pay | Admitting: Internal Medicine

## 2014-09-12 VITALS — BP 140/70 | HR 90 | Ht 66.0 in | Wt 209.4 lb

## 2014-09-12 DIAGNOSIS — E1129 Type 2 diabetes mellitus with other diabetic kidney complication: Secondary | ICD-10-CM | POA: Diagnosis not present

## 2014-09-12 DIAGNOSIS — N058 Unspecified nephritic syndrome with other morphologic changes: Secondary | ICD-10-CM | POA: Diagnosis not present

## 2014-09-12 DIAGNOSIS — Z23 Encounter for immunization: Secondary | ICD-10-CM | POA: Diagnosis not present

## 2014-09-12 DIAGNOSIS — E1165 Type 2 diabetes mellitus with hyperglycemia: Secondary | ICD-10-CM | POA: Diagnosis not present

## 2014-09-12 DIAGNOSIS — R209 Unspecified disturbances of skin sensation: Secondary | ICD-10-CM | POA: Diagnosis not present

## 2014-09-12 DIAGNOSIS — J449 Chronic obstructive pulmonary disease, unspecified: Secondary | ICD-10-CM | POA: Diagnosis not present

## 2014-09-12 DIAGNOSIS — J310 Chronic rhinitis: Secondary | ICD-10-CM

## 2014-09-12 DIAGNOSIS — N182 Chronic kidney disease, stage 2 (mild): Secondary | ICD-10-CM | POA: Diagnosis not present

## 2014-09-12 DIAGNOSIS — I129 Hypertensive chronic kidney disease with stage 1 through stage 4 chronic kidney disease, or unspecified chronic kidney disease: Secondary | ICD-10-CM | POA: Diagnosis not present

## 2014-09-12 MED ORDER — FLUTICASONE FUROATE-VILANTEROL 100-25 MCG/INH IN AEPB: 1.0000 | INHALATION_SPRAY | Freq: Every day | RESPIRATORY_TRACT | Status: DC

## 2014-09-12 MED ORDER — METHYLPREDNISOLONE ACETATE 80 MG/ML IJ SUSP
80.0000 mg | Freq: Once | INTRAMUSCULAR | Status: AC
  Administered 2014-09-12: 80 mg via INTRAMUSCULAR

## 2014-09-12 MED ORDER — LEVALBUTEROL HCL 0.63 MG/3ML IN NEBU
0.6300 mg | INHALATION_SOLUTION | Freq: Once | RESPIRATORY_TRACT | Status: AC
  Administered 2014-09-12: 0.63 mg via RESPIRATORY_TRACT

## 2014-09-12 NOTE — Patient Instructions (Signed)
Flu vax  Depo 80  Neb xop 0.63  Sample Breo 100   1 puff then rinse mouth one time daily

## 2014-09-12 NOTE — Progress Notes (Signed)
02/28/14- 12 yoF former smoker Self referral d/t tickle in throat and constant cough x 4-5 years.  Referred courtesy of Dr Baird Cancer. Cough worse x 5 months. Productive white sputum. Cough is usually intermittent but this year persistent since October. Throat gurgle bothers her at night. Occasional nasal stuffiness. History of seasonal spring and fall rhinitis. Hydrocodone cough syrup helped, but does not want to depend on it. Advair did not help. Gastroenterology has diagnosed GERD but she says upper and lower GI were negative. History of abdominal hernia. She thinks part of her problem is pressure on this hernia when she bends over  ENT workup negative, skin test negative. Antibiotics did not help in September.. Chest feels tight/heavy. Worse in cold or with exercise. Not short of breath. Pending cardiology appointment. CXR 07/11/11 IMPRESSION:  The minimal fibrotic change in the left base appears stable  compared to previous CT.  There are slight increased pulmonary markings within the right  inferior perihilar region with minimal central peribronchial  thickening. No consolidation or pleural effusion is evident.  Peribronchial thickening may be associated with bronchitis, asthma,  and reactive airway disease.  Original Report Authenticated By: Delane Ginger, M.D.  04/11/14- 8 yoF former smoker Self referral d/t tickle in throat and constant cough x 4-5 years complicated by GERD/ abdominal hernia FOLLOWS FOR: Pt states her breathing has improved since last OV. Mild SOB with activity. Pt c/o persistant cough with clear and white mucous. Denies CP.  CT 03/09/14 IMPRESSION  1. No findings to explain the patient's shortness of breath.  2. No pulmonary parenchymal findings of sarcoid.  3. Three-vessel coronary artery calcification.  SIGNATURE  Electronically Signed  By: Lorin Picket M.D.  On: 03/09/2014 13:36   05/04/14-67 yoF former smoker Self referral d/t tickle in throat and constant cough x 4-5  years complicated by GERD/ abdominal hernia, CAD FOLLOWS FOR:  Still having cough with thick white mucus, wheezing.  Pt reports symptoms have gotten worse, on augmentin Myocardial Perfusion Imaging- Negative for ischemia, EF 50%. PFT-03/17/2014 moderately severe obstructive airways disease with insignificant response to bronchodilator,  moderate diffusion defect. FVC 1.93/71%, FEV1 1.23/58%, FEV1/FEC 0.63 , FEF 25-75% 0.66/33%, TLC 85%, DLCO 56%. Dymista did not help-much nasal mucus discharge. Has almost finished Augmentin with no effect. 2 episodes of hard coughing to breath away.says cough is due to throat tickle. Does not choke when she swallows. We discussed reflux as a basis for cough. She had upper and lower endoscopy/ [facility] in 2014 but does not know results. Had rhinoscopy by ENT.  09/12/14- 76 yoF former smoker Self referral d/t tickle in throat and constant cough x 4-5 years complicated by GERD/ abdominal hernia, CAD Cough returned x 3 wks ago - prod with clear mucus. Has wheezing qhs.  No SOB or chest tightness/pain. Now has wheeze. She restarted lisinopril 1 week ago after seeing no difference off.  We had given singulair, ipratropium nasal spray and augmentin in May. Throat itches despite negative allergy w/u. Ruthe Mannan and Spiriva made cough "worse"  ROS-see HPI Constitutional:   No-   weight loss, night sweats, fevers, chills, fatigue, lassitude. HEENT:   No-  headaches, difficulty swallowing, tooth/dental problems, sore throat,       No-  sneezing, +itching, ear ache, +nasal congestion, +post nasal drip,  CV:  No-   chest pain, orthopnea, PND, swelling in lower extremities, anasarca, dizziness, palpitations Resp: + shortness of breath with exertion or at rest.  No-   productive cough,  + non-productive cough,  No- coughing up of blood.              No-   change in color of mucus.  +wheezing.   Skin: No-   rash or lesions. GI:  No-   heartburn, indigestion, abdominal  pain, nausea, vomiting,  GU:  MS:  No-   joint pain or swelling. . Neuro-     nothing unusual Psych:  No- change in mood or affect. No depression or anxiety.  No memory loss.  OBJ- Physical Exam General- Alert, Oriented, Affect-appropriate, Distress- none acute Skin- rash-none, lesions- none, excoriation- none Lymphadenopathy- none Head- atraumatic            Eyes- Gross vision intact, PERRLA, conjunctivae and secretions clear            Ears- Hearing, canals-normal            Nose- +sniffing, no-Septal dev, mucus, polyps, erosion, perforation             Throat- Mallampati III , mucosa clear , drainage- none, tonsils- atrophic Neck- flexible , trachea midline, no stridor , thyroid nl, carotid no bruit Chest - symmetrical excursion , unlabored           Heart/CV- RRR , no murmur , no gallop  , no rub, nl s1 s2                           - JVD- none , edema- none, stasis changes- none, varices- none           Lung- , wheeze+I&E, cough- none , dullness-none, rub- none           Chest wall-  Abd- +. L abd wall hernia. Br/ Gen/ Rectal- Not done, not indicated Extrem- cyanosis- none, clubbing, none, atrophy- none, strength- nl Neuro- grossly intact to observation

## 2014-09-13 ENCOUNTER — Encounter: Payer: Self-pay | Admitting: Internal Medicine

## 2014-09-13 NOTE — Telephone Encounter (Signed)
Dr Annamaria Boots please see patient e-mail regarding question about Lisinopril.  Pt c/o dry cough and is concerned that Lisinopril might be the cause. Please advise. Thanks.

## 2014-09-15 ENCOUNTER — Telehealth: Payer: Self-pay | Admitting: Internal Medicine

## 2014-09-15 MED ORDER — HYDROCOD POLST-CHLORPHEN POLST 10-8 MG/5ML PO LQCR: 5.0000 mL | Freq: Two times a day (BID) | ORAL | Status: DC | PRN

## 2014-09-15 NOTE — Telephone Encounter (Signed)
Dr Annamaria Boots please see emails attached.

## 2014-09-15 NOTE — Telephone Encounter (Signed)
Per CDY Offer tussionex 120 ml 1 tsp BID. This contains a strong antihistamine as well as cough syrup. Try this through the weekend and see if it helps.  Called pt LMTCB x1

## 2014-09-15 NOTE — Telephone Encounter (Signed)
I would ask her to discuss with the doctor who prescribes her lisinopril. I would favor a longer trial off lisinopril for at least a month, to see if cough improves with a different class of medication.

## 2014-09-15 NOTE — Telephone Encounter (Signed)
[name], CMA at 09/15/2014 1:43 PM     Status: Signed        Per CDY  Offer tussionex 120 ml 1 tsp BID.  This contains a strong antihistamine as well as cough syrup. Try this through the weekend and see if it helps.  Called pt LMTCB x1   Spoke with the pt and made aware of recs per CDY  She verbalized understanding  Per CDY ask PCP about alternative for ACE inhibitor  Pt aware  Rx up front for pick up

## 2014-09-16 NOTE — Telephone Encounter (Signed)
We had previously tried replacing lisinopril with Benicar for several weeks, without affecting her cough. She can ask the doctor who prescribes lisinopril to consider a longer trial, due to the significant difficulty cough is causing her.

## 2014-09-23 NOTE — Assessment & Plan Note (Signed)
Describes throat itching now.

## 2014-09-23 NOTE — Assessment & Plan Note (Signed)
Exacerbation seemed to begin before she resumed lisinopril Plan- flu vax, neb xopenex, depomedrol, try [facility]

## 2014-09-28 NOTE — Telephone Encounter (Signed)
Dr Young, please advise thanks 

## 2014-09-29 NOTE — Telephone Encounter (Signed)
The strongest antihistamine on the market is actually sold an an antidepressant called doxepin. It can cause drowsiness, so it is taken at night. Suggest script doxepin 25 mg capsules, # 30    Take 1-3 at bedtime. Start low, since it might cause daytime sleepiness and can take a few days to adjust best dose.

## 2014-09-30 ENCOUNTER — Encounter: Payer: Self-pay | Admitting: Internal Medicine

## 2014-09-30 NOTE — Telephone Encounter (Addendum)
Please advise if you want her to take

## 2014-09-30 NOTE — Telephone Encounter (Signed)
Called spoke with pt. Dr. Baird Cancer prescribed gabapentin for her. She wants to know if this will interfere with the doxepin?  Also since singulair has not helped her she wants to know if she should refill this. Pt aware CDY gone for the day and will call her on Monday. Please advise thanks  No Known Allergies   Current Outpatient Prescriptions on File Prior to Visit  Medication Sig Dispense Refill  . aspirin 81 MG tablet Take 81 mg by mouth daily. Taking 2 daily      . Azelastine-Fluticasone (DYMISTA) 137-50 MCG/ACT SUSP Place 1-2 sprays into the nose at bedtime.  1 Bottle  0  . chlorpheniramine-HYDROcodone (TUSSIONEX PENNKINETIC ER) 10-8 MG/5ML LQCR Take 5 mLs by mouth every 12 (twelve) hours as needed for cough.  120 mL  0  . Cholecalciferol (VITAMIN D3) 2000 UNITS TABS Take 2,000 Units by mouth daily.      . Fluticasone Furoate-Vilanterol (BREO ELLIPTA) 100-25 MCG/INH AEPB Inhale 1 puff into the lungs daily.  1 each  0  . HYDROcodone-homatropine (HYCODAN) 5-1.5 MG/5ML syrup Take 5 mLs by mouth every 6 (six) hours as needed for cough.  240 mL  0  . ipratropium (ATROVENT) 0.06 % nasal spray 1-2 puffs each nostril up to 4 times daily as needed  15 mL  12  . lisinopril (PRINIVIL,ZESTRIL) 2.5 MG tablet Take 2.5 mg by mouth daily.      . mometasone-formoterol (DULERA) 100-5 MCG/ACT AERO Inhale 2 puffs into the lungs 2 (two) times daily.  1 Inhaler  0  . montelukast (SINGULAIR) 10 MG tablet Take 1 tablet (10 mg total) by mouth at bedtime.  30 tablet  11  . Multiple Minerals (CALCIUM-MAGNESIUM-ZINC) TABS Take 1 tablet by mouth daily.      . rosuvastatin (CRESTOR) 20 MG tablet Take 20 mg by mouth daily.      . sitaGLIPtin (JANUVIA) 100 MG tablet Take 100 mg by mouth daily.      Marland Kitchen Spacer/Aero-Holding Chambers (AEROCHAMBER Z-STAT PLUS) inhaler Use as instructed  1 each  0  . tiotropium (SPIRIVA) 18 MCG inhalation capsule Place 1 capsule (18 mcg total) into inhaler and inhale daily.  30 capsule  6    No current facility-administered medications on file prior to visit.

## 2014-10-01 NOTE — Telephone Encounter (Signed)
Best not to start 2 new meds at same time. Gabapentin is sometimes tried for cough. Is that why Dr Baird Cancer gave it, or for another purpose? I would try it first.

## 2014-10-03 NOTE — Telephone Encounter (Signed)
I think I responded to her latest question on 10/3. Try the gabapentin first

## 2014-10-04 NOTE — Telephone Encounter (Signed)
Dr Annamaria Boots, sorry but pt sent another email asking about her refilling her Singulair (question originally posed on 10.2.15). Per the most recent 10.5.15 email, pt stated she feels this medication is not working well for her The medication was originally started at the 5.6.15 ov and she was given #30 with 11 additional refills I have called the pharmacy that the rx was sent to ([facility]) and per the pharmacist, Singulair was only filled x1 on the date it was written in May 2015 - it has not been sent to another pharmacy.  Would you recommend she continue taking this?  Thank you. Message Body I have had a week and a half to try Advair Inhaler and have found that it doesn't work as well as Symbicort, so I do not want a prescription for this.   Would you please contact [facility], [phone], and submit a prescription for Symbicort, 80/4.5?   Thank you.   [name]

## 2014-10-04 NOTE — Telephone Encounter (Addendum)
Dr Annamaria Boots, pt had responded to your question regarding the prescriber of the Gabapentin from 10.3.15: Message Body  Dr. Baird Cancer prescribed the Gabapentin because I have been having nerve pain in my legs down to my feet. Also told her my legs, from my thighs down, hurt whenever I stand and bad pain in my lower back. But I think she prescribed med because of nerve pain.   Sending this as FYI only.  Have already recommended pt to try the Gabapentin as you recommended in the 10.3.15 documentation.

## 2014-10-04 NOTE — Telephone Encounter (Signed)
If not certain that Singulair has helped, then stop it. We can always restart it later.

## 2014-10-05 ENCOUNTER — Encounter: Payer: Self-pay | Admitting: Internal Medicine

## 2014-10-05 NOTE — Telephone Encounter (Signed)
Ok to refill tussionex 

## 2014-10-05 NOTE — Telephone Encounter (Signed)
Dr Annamaria Boots, please advise on refill request for Tussionex. Thanks.

## 2014-10-06 ENCOUNTER — Telehealth: Payer: Self-pay | Admitting: Internal Medicine

## 2014-10-06 MED ORDER — HYDROCOD POLST-CHLORPHEN POLST 10-8 MG/5ML PO LQCR: 5.0000 mL | Freq: Two times a day (BID) | ORAL | Status: DC | PRN

## 2014-10-06 NOTE — Telephone Encounter (Signed)
This has already been taken care of

## 2014-10-06 NOTE — Telephone Encounter (Signed)
Per E-mail this is okay per CD[facility]  Rx up front for pick up  Pt aware and nothing further needed

## 2014-10-07 DIAGNOSIS — Z1231 Encounter for screening mammogram for malignant neoplasm of breast: Secondary | ICD-10-CM | POA: Diagnosis not present

## 2014-10-17 ENCOUNTER — Telehealth: Payer: Self-pay | Admitting: Internal Medicine

## 2014-10-17 NOTE — Telephone Encounter (Signed)
Fine with me, would get her in ASAP with all active meds/ inhalers etc in hand

## 2014-10-17 NOTE — Telephone Encounter (Signed)
Dr. [name], pls advise if you are ok with switch.  Thank you.

## 2014-10-17 NOTE — Telephone Encounter (Signed)
That is fine with me.

## 2014-10-17 NOTE — Telephone Encounter (Signed)
Dr Annamaria Boots please advise if you are okay with this switch-- pt is requesting to switch physicians d/t an outside recommendation. Pt states that she was advised by an outside source to see Dr [name] as he specializes in cough. Please advise Dr Annamaria Boots. Thanks.

## 2014-10-17 NOTE — Telephone Encounter (Signed)
Spoke with pt.  Pt scheduled to see Dr. [name] on 10/19/14 at 9:30 am.  Pt asked to arrive at 9:15am and to bring all active meds and inhalers with her to appt.  She verbalized understanding and voiced no further questions or concerns at this time.

## 2014-10-19 ENCOUNTER — Ambulatory Visit (INDEPENDENT_AMBULATORY_CARE_PROVIDER_SITE_OTHER): Payer: Medicare Other | Admitting: Internal Medicine

## 2014-10-19 ENCOUNTER — Ambulatory Visit (INDEPENDENT_AMBULATORY_CARE_PROVIDER_SITE_OTHER)
Admission: RE | Admit: 2014-10-19 | Discharge: 2014-10-19 | Disposition: A | Discharge status: Home / Self Care | Payer: Medicare Other | Source: Ambulatory Visit | Attending: Internal Medicine | Admitting: Internal Medicine

## 2014-10-19 ENCOUNTER — Encounter: Payer: Self-pay | Admitting: Internal Medicine

## 2014-10-19 VITALS — BP 128/76 | HR 100 | Temp 97.7°F | Ht 66.0 in | Wt 206.0 lb

## 2014-10-19 DIAGNOSIS — J984 Other disorders of lung: Secondary | ICD-10-CM | POA: Diagnosis not present

## 2014-10-19 DIAGNOSIS — J449 Chronic obstructive pulmonary disease, unspecified: Secondary | ICD-10-CM

## 2014-10-19 DIAGNOSIS — R059 Cough, unspecified: Secondary | ICD-10-CM | POA: Insufficient documentation

## 2014-10-19 DIAGNOSIS — R0602 Shortness of breath: Secondary | ICD-10-CM | POA: Diagnosis not present

## 2014-10-19 DIAGNOSIS — R05 Cough: Secondary | ICD-10-CM

## 2014-10-19 MED ORDER — FAMOTIDINE 20 MG PO TABS: ORAL_TABLET | ORAL | Status: DC

## 2014-10-19 MED ORDER — PANTOPRAZOLE SODIUM 40 MG PO TBEC: 40.0000 mg | DELAYED_RELEASE_TABLET | Freq: Every day | ORAL | Status: DC

## 2014-10-19 MED ORDER — TRAMADOL HCL 50 MG PO TABS: ORAL_TABLET | ORAL | Status: DC

## 2014-10-19 MED ORDER — PREDNISONE 10 MG PO TABS: ORAL_TABLET | ORAL | Status: DC

## 2014-10-19 MED ORDER — LOSARTAN POTASSIUM 25 MG PO TABS: 25.0000 mg | ORAL_TABLET | Freq: Every day | ORAL | Status: DC

## 2014-10-19 NOTE — Progress Notes (Signed)
Subjective:    Patient ID: [name], female    DOB: Feb 25, 1946, 68 y.o.   MRN: [id number]  HPI  28 yobf quit smoking 2007 with chronic cough x 2011 self referred for evaluation of persistent cough   10/19/2014 1st  Pulmonary office visit/ Kyree Fedorko   Chief Complaint  Patient presents with  . Pulmonary Consult    Former pt of Dr. Annamaria Boots. Pt c/o increased cough x 3 wks. Cough is prod with moderate, thick, light yellow sputum.  She coughs until loses urinary continence.  Not sleeping well due to cough.   remembers having tendency to throat tickle in high sschool  but completely resolved by graduation  then recurred in her 64s and resolved with codeine then recurred in 2011  but bad to worse x 3 weeks assoc with sensation of choking, mucus is thick light yellow.  acei stopped about a month prior to OV  Replaced by losartan  Inhalers make it worse, esp dpi     Kouffman Reflux v Neurogenic Cough Differentiator Reflux Comments  Do you awaken from a sound sleep coughing violently?                            With trouble breathing? Yes   Do you have choking episodes when you cannot  Get enough air, gasping for air ?              Yes   Do you usually cough when you lie down into  The bed, or when you just lie down to rest ?                          Yes   Do you usually cough after meals or eating?         maybe   Do you cough when (or after) you bend over?    No    GERD SCORE     Kouffman Reflux v Neurogenic Cough Differentiator Neurogenic   Do you more-or-less cough all day long? yes   Does change of temperature make you cough? no   Does laughing or chuckling cause you to cough? no   Do fumes (perfume, automobile fumes, burned  Toast, etc.,) cause you to cough ?      Not aware   Does speaking, singing, or talking on the phone cause you to cough   ?               sometimes   Neurogenic/Airway score      No obvious other patterns in day to day or daytime variabilty or assoc sob  or cp or chest tightness, subjective wheeze overt sinus or hb symptoms. No unusual exp hx or h/o childhood pna/ asthma or knowledge of premature birth.  Sleeping ok without nocturnal  or early am exacerbation  of respiratory  c/o's or need for noct saba. Also denies any obvious fluctuation of symptoms with weather or environmental changes or other aggravating or alleviating factors except as outlined above   Current Medications, Allergies, Complete Past Medical History, Past Surgical History, Family History, and Social History were reviewed in [employer] record.          Review of Systems  Constitutional: Negative for fever, chills and unexpected weight change.  HENT: Negative for congestion, dental problem, ear pain, nosebleeds, postnasal drip, rhinorrhea, sinus pressure, sneezing, sore throat, trouble swallowing and  voice change.   Eyes: Negative for visual disturbance.  Respiratory: Positive for cough. Negative for choking and shortness of breath.   Cardiovascular: Negative for chest pain and leg swelling.  Gastrointestinal: Negative for vomiting, abdominal pain and diarrhea.  Genitourinary: Negative for difficulty urinating.  Musculoskeletal: Negative for arthralgias.  Skin: Negative for rash.  Neurological: Positive for headaches. Negative for tremors and syncope.  Hematological: Does not bruise/bleed easily.       Objective:   Physical Exam  amb bf mod hoarse, congested sounding cough /cough worse on inspiration   Wt Readings from Last 3 Encounters:  10/19/14 206 lb (93.441 kg)  09/12/14 209 lb 6.4 oz (94.983 kg)  05/04/14 202 lb (91.627 kg)       HEENT mild turbinate edema.  Oropharynx no thrush or excess pnd or cobblestoning.  No JVD or cervical adenopathy. Mild accessory muscle hypertrophy. Trachea midline, nl thryroid. Chest was hyperinflated by percussion with diminished breath sounds and moderate increased exp time with mod mid exp  wheeze.  Hoover sign positive at mid inspiration. Regular rate and rhythm without murmur gallop or rub or increase P2 or edema.  Abd: no hsm, nl excursion. Ext warm without cyanosis or clubbing.      CXR  10/19/2014 : 1. No active cardiopulmonary abnormalities. 2. Hyperinflation.          Assessment & Plan:

## 2014-10-19 NOTE — Patient Instructions (Addendum)
Please see patient coordinator before you leave today  to schedule sinus CT   Please remember to go to the lab and x-ray department downstairs for your tests - we will call you with the results when they are available.     The key to effective treatment for your cough is eliminating the non-stop cycle of cough you're stuck in long enough to let your airway heal completely and then see if there is anything still making you cough once you stop the cough suppression, but this should take no more than 5 days to figure out  First take delsym two tsp every 12 hours and supplement if needed with  tramadol 50 mg up to 2 every 4 hours to suppress the urge to cough at all or even clear your throat. Swallowing water or using ice chips/non mint and menthol containing candies (such as lifesavers or sugarless jolly ranchers) are also effective.  You should rest your voice and avoid activities that you know make you cough.  Once you have eliminated the cough for 3 straight days try reducing the tramadol first,  then the delsym as tolerated.    Prednisone 10 mg take  4 each am x 2 days,   2 each am x 2 days,  1 each am x 2 days and stop (this is to eliminate allergies and inflammation from coughing)  Protonix (pantoprazole) Take 30-60 min before first meal of the day and Pepcid 20 mg one bedtime plus chlorpheniramine 4 mg x 2 at bedtime (both available over the counter)  until cough is completely gone for at least a week without the need for cough suppression  GERD (REFLUX)  is an extremely common cause of respiratory symptoms, many times with no significant heartburn at all.    It can be treated with medication, but also with lifestyle changes including avoidance of late meals, excessive alcohol, smoking cessation, and avoid fatty foods, chocolate, peppermint, colas, red wine, and acidic juices such as orange juice.  NO MINT OR MENTHOL PRODUCTS SO NO COUGH DROPS  USE HARD CANDY INSTEAD (jolley ranchers or  Stover's or Lifesavers (all available in sugarless versions) NO OIL BASED VITAMINS - use powdered substitutes.    Please schedule a follow up office visit in 2 weeks, sooner if needed

## 2014-10-20 NOTE — Progress Notes (Signed)
Quick Note:  Spoke with pt and notified of results per Dr. Wert. Pt verbalized understanding and denied any questions.  ______ 

## 2014-10-21 ENCOUNTER — Encounter: Payer: Self-pay | Admitting: Internal Medicine

## 2014-10-21 NOTE — Assessment & Plan Note (Signed)
This cough is not typical of copd/ cb: doesn't respond to inhalers, more day than night or early am, worse on insp and no assoc with sob so will leave off all inhalers for now and focus on eliminating the cough the address whether needs copd rx or not

## 2014-10-21 NOTE — Assessment & Plan Note (Addendum)
The most common causes of chronic cough in immunocompetent adults include the following: upper airway cough syndrome (UACS), previously referred to as postnasal drip syndrome (PNDS), which is caused by variety of rhinosinus conditions; (2) asthma; (3) GERD; (4) chronic bronchitis from cigarette smoking or other inhaled environmental irritants; (5) nonasthmatic eosinophilic bronchitis; and (6) bronchiectasis.   These conditions, singly or in combination, have accounted for up to 94% of the causes of chronic cough in prospective studies.   Other conditions have constituted no >6% of the causes in prospective studies These have included bronchogenic carcinoma, chronic interstitial pneumonia, sarcoidosis, left ventricular failure, ACEI-induced cough, and aspiration from a condition associated with pharyngeal dysfunction.    Chronic cough is often simultaneously caused by more than one condition. A single cause has been found from 38 to 82% of the time, multiple causes from 18 to 62%. Multiply caused cough has been the result of three diseases up to 42% of the time.       Based on hx and exam, this is most likely:  Classic Upper airway cough syndrome, so named because it's frequently impossible to sort out how much is  CR/sinusitis with freq throat clearing (which can be related to primary GERD)   vs  causing  secondary (" extra esophageal")  GERD from wide swings in gastric pressure that occur with throat clearing, often  promoting self use of mint and menthol lozenges that reduce the lower esophageal sphincter tone and exacerbate the problem further in a cyclical fashion.   These are the same pts (now being labeled as having "irritable larynx syndrome" by some cough centers) who not infrequently have a history of having failed to tolerate ace inhibitors,  dry powder inhalers or biphosphonates or report having atypical reflux symptoms that don't respond to standard doses of PPI , and are easily confused as  having aecopd or asthma flares by even experienced allergists/ pulmonologists.   The first step is to maximize acid suppression and eliminate cyclical coughing then regroup in 2 weeks May need to replace losartan at next ov based on intol to acei   See instructions for specific recommendations which were reviewed directly with the patient who was given a copy with highlighter outlining the key components.

## 2014-10-25 ENCOUNTER — Encounter: Payer: Self-pay | Admitting: Internal Medicine

## 2014-10-25 ENCOUNTER — Ambulatory Visit (INDEPENDENT_AMBULATORY_CARE_PROVIDER_SITE_OTHER)
Admission: RE | Admit: 2014-10-25 | Discharge: 2014-10-25 | Disposition: A | Discharge status: Home / Self Care | Payer: Medicare Other | Source: Ambulatory Visit | Attending: Internal Medicine | Admitting: Internal Medicine

## 2014-10-25 DIAGNOSIS — R059 Cough, unspecified: Secondary | ICD-10-CM

## 2014-10-25 DIAGNOSIS — R05 Cough: Secondary | ICD-10-CM

## 2014-10-25 DIAGNOSIS — R0602 Shortness of breath: Secondary | ICD-10-CM | POA: Diagnosis not present

## 2014-10-25 NOTE — Progress Notes (Signed)
Quick Note:  LMTCB ______ 

## 2014-10-26 ENCOUNTER — Encounter: Payer: Self-pay | Admitting: Internal Medicine

## 2014-10-26 NOTE — Telephone Encounter (Signed)
10.28 mychart message from pt: Message     Had 3D mammogram on October 07, 2014 at [place]. Please update medical records. Thank you.   Health maintenance updated.  Email sent to pt informing her if this. Will sign off

## 2014-11-02 ENCOUNTER — Encounter: Payer: Self-pay | Admitting: Internal Medicine

## 2014-11-02 ENCOUNTER — Ambulatory Visit (INDEPENDENT_AMBULATORY_CARE_PROVIDER_SITE_OTHER): Payer: Medicare Other | Admitting: Internal Medicine

## 2014-11-02 VITALS — BP 112/80 | HR 110 | Ht 66.0 in | Wt 205.5 lb

## 2014-11-02 DIAGNOSIS — R05 Cough: Secondary | ICD-10-CM | POA: Diagnosis not present

## 2014-11-02 DIAGNOSIS — J449 Chronic obstructive pulmonary disease, unspecified: Secondary | ICD-10-CM

## 2014-11-02 DIAGNOSIS — R059 Cough, unspecified: Secondary | ICD-10-CM

## 2014-11-02 DIAGNOSIS — I10 Essential (primary) hypertension: Secondary | ICD-10-CM | POA: Diagnosis not present

## 2014-11-02 MED ORDER — NEBIVOLOL HCL 5 MG PO TABS: 5.0000 mg | ORAL_TABLET | Freq: Every day | ORAL | Status: DC

## 2014-11-02 MED ORDER — PREDNISONE 10 MG PO TABS: ORAL_TABLET | ORAL | Status: DC

## 2014-11-02 MED ORDER — TRAMADOL HCL 50 MG PO TABS: ORAL_TABLET | ORAL | Status: DC

## 2014-11-02 NOTE — Patient Instructions (Addendum)
Stop losartan   Start bystolic 5 mg daily  x 2 weeks  For drainage take chlortrimeton (chlorpheniramine) 4 mg every 4 hours available over the counter (may cause drowsiness)   Continue protonix and pepcid as your are.  Continue delsym 2 tsp every 12 hours until no longer coughing at all and use tramadol 50 mg evey 4 hours as need   Prednisone 10 mg take  4 each am x 2 days,   2 each am x 2 days,  1 each am x 2 days and stop   See Tammy NP in 2  weeks with all your medications, even over the counter meds, separated in two separate bags, the ones you take no matter what vs the ones you stop once you feel better and take only as needed when you feel you need them.   Tammy  will generate for you a new user friendly medication calendar that will put [place] all on the same page re: your medication use.     Without this process, it simply isn't possible to assure that we are providing  your outpatient care  with  the attention to detail we feel you deserve.   If we cannot assure that you're getting that kind of care,  then we cannot manage your problem effectively from this clinic.  Once you have seen Tammy and we are sure that we're all on the same page with your medication use she will arrange follow up with me.

## 2014-11-02 NOTE — Progress Notes (Signed)
Subjective:    Patient ID: [name], female    DOB: 1946/08/16, 68 y.o.   MRN: [id number]    Brief patient profile:  21 yobf quit smoking 2007 with chronic cough x 2011 self referred for evaluation of persistent cough   History of Present Illness  10/19/2014 1st [facility] office visit/ [name]   Chief Complaint  Patient presents with  . Pulmonary Consult    Former pt of Dr. Annamaria Horton. Pt c/o increased cough x 3 wks. Cough is prod with moderate, thick, light yellow sputum.  She coughs until loses urinary continence.  Not sleeping well due to cough.   remembers having tendency to throat tickle in high sschool  but completely resolved by graduation  then recurred in her 89s and resolved with codeine then recurred in 2011  but bad to worse x 3 weeks assoc with sensation of choking, mucus is thick light yellow.  acei stopped about a month prior to OV  Replaced by losartan  Inhalers make it worse, esp dpi     Kouffman Reflux v Neurogenic Cough Differentiator Reflux Comments  Do you awaken from a sound sleep coughing violently?                            With trouble breathing? Yes   Do you have choking episodes when you cannot  Get enough air, gasping for air ?              Yes   Do you usually cough when you lie down into  The bed, or when you just lie down to rest ?                          Yes   Do you usually cough after meals or eating?         maybe   Do you cough when (or after) you bend over?    No    GERD SCORE     Kouffman Reflux v Neurogenic Cough Differentiator Neurogenic   Do you more-or-less cough all day long? yes   Does change of temperature make you cough? no   Does laughing or chuckling cause you to cough? no   Do fumes (perfume, automobile fumes, burned  Toast, etc.,) cause you to cough ?      Not aware   Does speaking, singing, or talking on the phone cause you to cough   ?               sometimes   Neurogenic/Airway score       rec  schedule sinus CT  > neg  First take delsym two tsp every 12 hours and supplement if needed with  tramadol 50 mg up to 2 every 4 hours to suppress the urge to cough at all or even clear your throat.  Prednisone 10 mg take  4 each am x 2 days,   2 each am x 2 days,  1 each am x 2 days and stop (this is to eliminate allergies and inflammation from coughing) Protonix (pantoprazole) Take 30-60 min before first meal of the day and Pepcid 20 mg one bedtime plus chlorpheniramine 4 mg x 2 at bedtime (both available over the counter)    11/03/2014 f/u ov/[name] re: cough x 5 weeks/ Mild COPD  Chief Complaint  Patient presents with  . Follow-up    Pt states  that cough is some better.  Still coughing up minimal white, foamy sputum.  No new co's today.      Only really sob when coughing, noct cough eliminated with pepcid and h1 hs so mostly daytime now esp with voice use, much better but never eliminated on tramadol No obvious day to day or daytime variabilty or assoc   cp or chest tightness, subjective wheeze overt sinus or hb symptoms. No unusual exp hx or h/o childhood pna/ asthma or knowledge of premature birth.  Sleeping ok without nocturnal  or early am exacerbation  of respiratory  c/o's or need for noct saba. Also denies any obvious fluctuation of symptoms with weather or environmental changes or other aggravating or alleviating factors except as outlined above   Current Medications, Allergies, Complete Past Medical History, Past Surgical History, Family History, and Social History were reviewed in [employer] record.  ROS  The following are not active complaints unless bolded sore throat, dysphagia, dental problems, itching, sneezing,  nasal congestion or excess/ purulent secretions, ear ache,   fever, chills, sweats, unintended wt loss, pleuritic or exertional cp, hemoptysis,  orthopnea pnd or leg swelling, presyncope, palpitations, heartburn, abdominal pain, anorexia, nausea, vomiting, diarrhea   or change in bowel or urinary habits, change in stools or urine, dysuria,hematuria,  rash, arthralgias, visual complaints, headache, numbness weakness or ataxia or problems with walking or coordination,  change in mood/affect or memory.                      Objective:   Physical Exam  amb bf mildly  hoarse, min congested sounding cough /cough worse on inspiration    11/02/2014         205  Wt Readings from Last 3 Encounters:  10/19/14 206 lb (93.441 kg)  09/12/14 209 lb 6.4 oz (94.983 kg)  05/04/14 202 lb (91.627 kg)       HEENT mild turbinate edema.  Oropharynx no thrush or excess pnd or cobblestoning.  No JVD or cervical adenopathy. Mild accessory muscle hypertrophy. Trachea midline, nl thryroid. Chest was hyperinflated by percussion with diminished breath sounds and moderate increased exp time with mod mid exp  wheeze. Hoover sign positive at mid inspiration. Regular rate and rhythm without murmur gallop or rub or increase P2 or edema.  Abd: no hsm, nl excursion. Ext warm without cyanosis or clubbing.      CXR  10/19/2014 : 1. No active cardiopulmonary abnormalities. 2. Hyperinflation.          Assessment & Plan:

## 2014-11-03 DIAGNOSIS — I10 Essential (primary) hypertension: Secondary | ICD-10-CM | POA: Insufficient documentation

## 2014-11-03 NOTE — Assessment & Plan Note (Signed)
-   allergy profile 03/10/14  >  IgE 18 with neg RAST - sinus ct 10/25/2014 > Clear sinuses.  Still strongly support dx of uacs.   Classic Upper airway cough syndrome, so named because it's frequently impossible to sort out how much is  CR/sinusitis with freq throat clearing (which can be related to primary GERD)   vs  causing  secondary (" extra esophageal")  GERD from wide swings in gastric pressure that occur with throat clearing, often  promoting self use of mint and menthol lozenges that reduce the lower esophageal sphincter tone and exacerbate the problem further in a cyclical fashion.   These are the same pts (now being labeled as having "irritable larynx syndrome" by some cough centers) who not infrequently have a history of having failed to tolerate ace inhibitors( as likely was the case here)   dry powder inhalers or biphosphonates or report having atypical reflux symptoms that don't respond to standard doses of PPI , and are easily confused as having aecopd or asthma flares by even experienced allergists/ pulmonologists.  Since couldn't tol acei may have issue with losartan as well For reasons that may related to vascular permability and nitric oxide pathways but not elevated  bradykinin levels (as seen with  ACEi use) losartan in the generic form has been reported now from mulitple sources  to cause a similar pattern of non-specific  upper airway symptoms as seen with acei.   This has not been reported with exposure to the other ARB's to date, so it seems reasonable for now to try either generic diovan or avapro if ARB needed or use an alternative class altogether.  See:  Lelon Frohlich Allergy Asthma Immunol  2008: 101: p [phone]    Will change to bystolic and return in 2 weeks for med rec and consider adding neurontin

## 2014-11-03 NOTE — Assessment & Plan Note (Signed)
PFT: 03/17/2014-  FEV1  1.28 (58%) ratio 66 and no better p saba, ERV 27 and DLCO 56 corrects to 77  Although she does have copd criteria, her cough occurs daytime, not early am or noct, and on insp typical of uacs (see sep a/p)  Inhalers make her cough, her primary symptom, worse, so avoiding for now.

## 2014-11-03 NOTE — Assessment & Plan Note (Signed)
See uacs > Strongly prefer in this setting: Bystolic, the most beta -1  selective Beta blocker available in sample form, with bisoprolol the most selective generic choice  on the market.  Try 5mg  bystolic samples for now

## 2014-11-14 ENCOUNTER — Ambulatory Visit: Payer: Medicare Other | Admitting: Internal Medicine

## 2014-11-18 ENCOUNTER — Ambulatory Visit (INDEPENDENT_AMBULATORY_CARE_PROVIDER_SITE_OTHER): Payer: Medicare Other | Admitting: Adult Health

## 2014-11-18 ENCOUNTER — Encounter: Payer: Self-pay | Admitting: Adult Health

## 2014-11-18 VITALS — BP 100/72 | HR 76 | Ht 66.0 in

## 2014-11-18 DIAGNOSIS — J449 Chronic obstructive pulmonary disease, unspecified: Secondary | ICD-10-CM

## 2014-11-18 DIAGNOSIS — R05 Cough: Secondary | ICD-10-CM | POA: Diagnosis not present

## 2014-11-18 DIAGNOSIS — I10 Essential (primary) hypertension: Secondary | ICD-10-CM

## 2014-11-18 DIAGNOSIS — R059 Cough, unspecified: Secondary | ICD-10-CM

## 2014-11-18 NOTE — Patient Instructions (Addendum)
Add Allergy Relief (Chlortrimeton 4mg  ) 2 At bedtime   May use 1 extra allergy relief every 4hr as needed for throat clearing .  Continue with cough control with delsym and tramadol.  Continue on Bystolic 5mg  daily  Follow up with Primary doctor for blood pressure.  Follow up Dr. [name]  In 6 weeks and As needed   Please contact office for sooner follow up if symptoms do not improve or worsen or seek emergency care

## 2014-11-21 ENCOUNTER — Telehealth: Payer: Self-pay | Admitting: Adult Health

## 2014-11-21 ENCOUNTER — Encounter: Payer: Self-pay | Admitting: Internal Medicine

## 2014-11-21 DIAGNOSIS — R059 Cough, unspecified: Secondary | ICD-10-CM

## 2014-11-21 DIAGNOSIS — R05 Cough: Secondary | ICD-10-CM

## 2014-11-21 MED ORDER — TRAMADOL HCL 50 MG PO TABS: ORAL_TABLET | ORAL | Status: DC

## 2014-11-21 MED ORDER — NEBIVOLOL HCL 5 MG PO TABS
5.0000 mg | ORAL_TABLET | Freq: Every day | ORAL | Status: DC
Start: 2014-11-21 — End: 2015-04-07

## 2014-11-21 NOTE — Telephone Encounter (Signed)
Will close since this was already handled in a phone call done today

## 2014-11-21 NOTE — Progress Notes (Signed)
Subjective:    Patient ID: [name], female    DOB: 12/29/1946, 68 y.o.   MRN: [id number]    Brief patient profile:  59 yobf quit smoking 2007 with chronic cough x 2011 self referred for evaluation of persistent cough   History of Present Illness  10/19/2014 1st [facility] office visit/ Wert   Chief Complaint  Patient presents with  . Pulmonary Consult    Former pt of Dr. Annamaria Boots. Pt c/o increased cough x 3 wks. Cough is prod with moderate, thick, light yellow sputum.  She coughs until loses urinary continence.  Not sleeping well due to cough.   remembers having tendency to throat tickle in high sschool  but completely resolved by graduation  then recurred in her 59s and resolved with codeine then recurred in 2011  but bad to worse x 3 weeks assoc with sensation of choking, mucus is thick light yellow.  acei stopped about a month prior to OV  Replaced by losartan  Inhalers make it worse, esp dpi     Kouffman Reflux v Neurogenic Cough Differentiator Reflux Comments  Do you awaken from a sound sleep coughing violently?                            With trouble breathing? Yes   Do you have choking episodes when you cannot  Get enough air, gasping for air ?              Yes   Do you usually cough when you lie down into  The bed, or when you just lie down to rest ?                          Yes   Do you usually cough after meals or eating?         maybe   Do you cough when (or after) you bend over?    No    GERD SCORE     Kouffman Reflux v Neurogenic Cough Differentiator Neurogenic   Do you more-or-less cough all day long? yes   Does change of temperature make you cough? no   Does laughing or chuckling cause you to cough? no   Do fumes (perfume, automobile fumes, burned  Toast, etc.,) cause you to cough ?      Not aware   Does speaking, singing, or talking on the phone cause you to cough   ?               sometimes   Neurogenic/Airway score       rec  schedule sinus CT  > neg  First take delsym two tsp every 12 hours and supplement if needed with  tramadol 50 mg up to 2 every 4 hours to suppress the urge to cough at all or even clear your throat.  Prednisone 10 mg take  4 each am x 2 days,   2 each am x 2 days,  1 each am x 2 days and stop (this is to eliminate allergies and inflammation from coughing) Protonix (pantoprazole) Take 30-60 min before first meal of the day and Pepcid 20 mg one bedtime plus chlorpheniramine 4 mg x 2 at bedtime (both available over the counter)    11/03/2014 f/u ov/Wert re: cough x 5 weeks/ Mild COPD  Chief Complaint  Patient presents with  . Follow-up    Pt states  that cough is some better.  Still coughing up minimal white, foamy sputum.  No new co's today.    >off ACE and rx bystolic   09/38/18 Follow up for Cough /Mild COPD  Pt returns for 2 week follow up and med review .  We reviewed all her medications organize them into a medication count with patient education Appears that she is taking her medications correctly Last visit. She was stopped off of her ACE inhibitor due to cough She was given Bystolic for blood pressure control Blood pressure is well-controlled today. She is tolerating medication well She returns without any significant change in her shortness of breath or cough. Chest x-ray showed no acute process, CT sinus showed no acute sinusitis. She denies any hemoptysis, chest pain, orthopnea, PND or leg swelling   Current Medications, Allergies, Complete Past Medical History, Past Surgical History, Family History, and Social History were reviewed in [employer] record.  ROS  The following are not active complaints unless bolded sore throat, dysphagia, dental problems, itching, sneezing,  nasal congestion or excess/ purulent secretions, ear ache,   fever, chills, sweats, unintended wt loss, pleuritic or exertional cp, hemoptysis,  orthopnea pnd or leg swelling, presyncope, palpitations,  heartburn, abdominal pain, anorexia, nausea, vomiting, diarrhea  or change in bowel or urinary habits, change in stools or urine, dysuria,hematuria,  rash, arthralgias, visual complaints, headache, numbness weakness or ataxia or problems with walking or coordination,  change in mood/affect or memory.                      Objective:   Physical Exam  amb bf mildly  hoarse, min congested sounding cough /cough worse on inspiration    11/02/2014         205      HEENT mild turbinate edema.  Oropharynx no thrush or excess pnd or cobblestoning.  No JVD or cervical adenopathy. Mild accessory muscle hypertrophy. Trachea midline, nl thryroid. Chest was hyperinflated by percussion with diminished breath sounds and moderate increased exp time, no wheezing . Hoover sign positive at mid inspiration. Regular rate and rhythm without murmur gallop or rub or increase P2 or edema.  Abd: no hsm, nl excursion. Ext warm without cyanosis or clubbing.      CXR  10/19/2014 : 1. No active cardiopulmonary abnormalities. 2. Hyperinflation.          Assessment & Plan:

## 2014-11-21 NOTE — Assessment & Plan Note (Signed)
Cyclical cough with AR triggers Remain off ACE   Plan   Add Allergy Relief (Chlortrimeton 4mg  ) 2 At bedtime   May use 1 extra allergy relief every 4hr as needed for throat clearing .  Continue with cough control with delsym and tramadol.  Continue on Bystolic 5mg  daily  Follow up with Primary doctor for blood pressure.  Follow up Dr. [name]  In 6 weeks and As needed   Please contact office for sooner follow up if symptoms do not improve or worsen or seek emergency care

## 2014-11-21 NOTE — Telephone Encounter (Signed)
Patient Instructions     Add Allergy Relief (Chlortrimeton 4mg  ) 2 At bedtime  May use 1 extra allergy relief every 4hr as needed for throat clearing .  Continue with cough control with delsym and tramadol.  Continue on Bystolic 5mg  daily  Follow up with Primary doctor for blood pressure.  Follow up Dr. [name] In 6 weeks and As needed  Please contact office for sooner follow up if symptoms do not improve or worsen or seek emergency care            Spoke with pt, states she needs tramadol and bystolic called in as they were never sent to the pharmacy on Friday.  Called these meds in to [facility] on [address].  Nothing further needed.

## 2014-11-21 NOTE — Assessment & Plan Note (Signed)
Avoid ACE inhibitor if possible   Plan  Continue on Bystolic 5mg  daily  Follow up with Primary doctor for blood pressure.  Please contact office for sooner follow up if symptoms do not improve or worsen or seek emergency care

## 2014-11-21 NOTE — Assessment & Plan Note (Addendum)
Consider rx if dyspnea persists

## 2014-11-29 ENCOUNTER — Encounter: Payer: Self-pay | Admitting: Internal Medicine

## 2014-11-29 MED ORDER — PREDNISONE 10 MG PO TABS: ORAL_TABLET | ORAL | Status: DC

## 2014-11-29 NOTE — Telephone Encounter (Signed)
Per 11/18/14 OV w/ TP; Patient Instructions     Add Allergy Relief (Chlortrimeton 4mg  ) 2 At bedtime  May use 1 extra allergy relief every 4hr as needed for throat clearing .  Continue with cough control with delsym and tramadol.  Continue on Bystolic 5mg  daily  Follow up with Primary doctor for blood pressure.  Follow up Dr. [name] In 6 weeks and As needed  Please contact office for sooner follow up if symptoms do not improve or worsen or seek emergency care    Pt reports she is coughing up white foamy mucus. She reports she coughs all night long and not able to sleep and clearing her throat constantly. She reports she has a "gurgiling at back of throat but not sure if it is PND. She reports she is taking chlortrimeton 4 mg every 4 hrs and 2 tabs at bedtime. She is taking the tramadol 1 tablet every 4 hrs also delsym every 12 hrs. Pt is requesting recs. Please advise MW thanks  No Known Allergies   Current Outpatient Prescriptions on File Prior to Visit  Medication Sig Dispense Refill  . aspirin 81 MG tablet Take 81 mg by mouth daily.     . baclofen (LIORESAL) 10 MG tablet Take 10 mg by mouth 2 (two) times daily as needed for muscle spasms.    . Biotin 2500 MCG CAPS Take 1 capsule by mouth daily.    . chlorpheniramine (CHLOR-TRIMETON) 4 MG tablet 2 at bedtime    . Cholecalciferol (VITAMIN D3) 2000 UNITS TABS Take 2,000 Units by mouth daily.    Marland Kitchen dextromethorphan (DELSYM) 30 MG/5ML liquid 2 tsp twice daily as needed for cough    . famotidine (PEPCID) 20 MG tablet One at bedtime 30 tablet 2  . montelukast (SINGULAIR) 10 MG tablet Take 1 tablet (10 mg total) by mouth at bedtime. 30 tablet 11  . Multiple Minerals (CALCIUM-MAGNESIUM-ZINC) TABS Take 1 tablet by mouth at bedtime.     . nebivolol (BYSTOLIC) 5 MG tablet Take 1 tablet (5 mg total) by mouth daily. 30 tablet 5  . pantoprazole (PROTONIX) 40 MG tablet Take 1 tablet (40 mg total) by mouth daily. Take 30-60 min before first meal  of the day 30 tablet 2  . predniSONE (DELTASONE) 10 MG tablet Take  4 each am x 2 days,   2 each am x 2 days,  1 each am x 2 days and stop 14 tablet 0  . rosuvastatin (CRESTOR) 20 MG tablet Take 20 mg by mouth daily.    . sitaGLIPtin (JANUVIA) 100 MG tablet Take 100 mg by mouth daily.    . traMADol (ULTRAM) 50 MG tablet 1-2 every 4 hours as needed for cough or pain 40 tablet 0   No current facility-administered medications on file prior to visit.

## 2014-11-29 NOTE — Telephone Encounter (Signed)
Spoke with pt  I have scheduled her to see MW tomorrow at 11:30 am  Pred taper sent in the meantime  Pt aware to bring med list AND all meds

## 2014-11-30 ENCOUNTER — Ambulatory Visit (INDEPENDENT_AMBULATORY_CARE_PROVIDER_SITE_OTHER): Payer: Medicare Other | Admitting: Internal Medicine

## 2014-11-30 ENCOUNTER — Encounter: Payer: Self-pay | Admitting: Internal Medicine

## 2014-11-30 VITALS — BP 122/80 | HR 88 | Ht 66.0 in | Wt 209.4 lb

## 2014-11-30 DIAGNOSIS — R05 Cough: Secondary | ICD-10-CM | POA: Diagnosis not present

## 2014-11-30 DIAGNOSIS — I10 Essential (primary) hypertension: Secondary | ICD-10-CM

## 2014-11-30 DIAGNOSIS — J449 Chronic obstructive pulmonary disease, unspecified: Secondary | ICD-10-CM

## 2014-11-30 DIAGNOSIS — R059 Cough, unspecified: Secondary | ICD-10-CM

## 2014-11-30 MED ORDER — GABAPENTIN 100 MG PO CAPS: ORAL_CAPSULE | ORAL | Status: DC

## 2014-11-30 MED ORDER — TRAMADOL HCL 50 MG PO TABS: ORAL_TABLET | ORAL | Status: DC

## 2014-11-30 NOTE — Progress Notes (Signed)
Subjective:    Patient ID: [name], female    DOB: August 15, 1946, 68 y.o.   MRN: [id number]    Brief patient profile:  35 yobf quit smoking 2007 with chronic cough x 2011 self referred for evaluation of persistent cough   History of Present Illness  10/19/2014 1st [facility] office visit/ Lucill Mauck   Chief Complaint  Patient presents with  . Pulmonary Consult    Former pt of Dr. Annamaria Boots. Pt c/o increased cough x 3 wks. Cough is prod with moderate, thick, light yellow sputum.  She coughs until loses urinary continence.  Not sleeping well due to cough.   remembers having tendency to throat tickle in high sschool  but completely resolved by graduation  then recurred in her 33s and resolved with codeine then recurred in 2011  but bad to worse x 3 weeks assoc with sensation of choking, mucus is thick light yellow.  acei stopped about a month prior to OV  Replaced by losartan  Inhalers make it worse, esp dpi    Kouffman Reflux v Neurogenic Cough Differentiator Reflux Comments  Do you awaken from a sound sleep coughing violently?                            With trouble breathing? Yes   Do you have choking episodes when you cannot  Get enough air, gasping for air ?              Yes   Do you usually cough when you lie down into  The bed, or when you just lie down to rest ?                          Yes   Do you usually cough after meals or eating?         maybe   Do you cough when (or after) you bend over?    No    GERD SCORE     Kouffman Reflux v Neurogenic Cough Differentiator Neurogenic   Do you more-or-less cough all day long? yes   Does change of temperature make you cough? no   Does laughing or chuckling cause you to cough? no   Do fumes (perfume, automobile fumes, burned  Toast, etc.,) cause you to cough ?      Not aware   Does speaking, singing, or talking on the phone cause you to cough   ?               sometimes   Neurogenic/Airway score       rec  schedule sinus CT >  neg  First take delsym two tsp every 12 hours and supplement if needed with  tramadol 50 mg up to 2 every 4 hours to suppress the urge to cough at all or even clear your throat.  Prednisone 10 mg take  4 each am x 2 days,   2 each am x 2 days,  1 each am x 2 days and stop (this is to eliminate allergies and inflammation from coughing) Protonix (pantoprazole) Take 30-60 min before first meal of the day and Pepcid 20 mg one bedtime plus chlorpheniramine 4 mg x 2 at bedtime (both available over the counter)    11/03/2014 f/u ov/Kabe Mckoy re: cough x 5 weeks/ Mild COPD  Chief Complaint  Patient presents with  . Follow-up    Pt states that  cough is some better.  Still coughing up minimal white, foamy sputum.  No new co's today.    >off ACE and rx bystolic   48/54/62 Follow up for Cough /Mild COPD  Pt returns for 2 week follow up and med review .  We reviewed all her medications organize them into a medication count with patient education Appears that she is taking her medications correctly Last visit. She was stopped off of her ACE inhibitor due to cough She was given Bystolic for blood pressure control Blood pressure is well-controlled today. She is tolerating medication well She returns without any significant change in her shortness of breath or cough. Chest x-ray showed no acute process, CT sinus showed no acute sinusitis. rec Add Allergy Relief (Chlortrimeton 4mg  ) 2 At bedtime   May use 1 extra allergy relief every 4hr as needed for throat clearing .  Continue with cough control with delsym and tramadol.  Continue on Bystolic 5mg  daily   11/30/2014 f/u ov/Deno Sida re:  Chief Complaint  Patient presents with  . Acute Visit    Pt states that her cough is no better since last visit here 11/18/14. She also c/o frequent throat clearing and "rattling in throat". She states that there have been 3 occasions since last visit where she woke up from sleeping with cough and "felt like choking".     overall pattern has not improved on rx unless includes tramadol  though not clear that she's been able to take the complex regimen  We've recommended and has not yet started the prednisone that was recommended over the phone    No obvious day to day or daytime variabilty or assoc excess or purulent sputum or cp or chest tightness, subjective wheeze overt sinus or hb symptoms. No unusual exp hx or h/o childhood pna/ asthma or knowledge of premature birth.    Also denies any obvious fluctuation of symptoms with weather or environmental changes or other aggravating or alleviating factors except as outlined above   Current Medications, Allergies, Complete Past Medical History, Past Surgical History, Family History, and Social History were reviewed in [employer] record.  ROS  The following are not active complaints unless bolded sore throat, dysphagia, dental problems, itching, sneezing,  nasal congestion or excess/ purulent secretions, ear ache,   fever, chills, sweats, unintended wt loss, pleuritic or exertional cp, hemoptysis,  orthopnea pnd or leg swelling, presyncope, palpitations, heartburn, abdominal pain, anorexia, nausea, vomiting, diarrhea  or change in bowel or urinary habits, change in stools or urine, dysuria,hematuria,  rash, arthralgias, visual complaints, headache, numbness weakness or ataxia or problems with walking or coordination,  change in mood/affect or memory.                               Objective:   Physical Exam  amb bf mildly  Hoarse, congested sounding cough /cough worse on inspiration    . Wt Readings from Last 3 Encounters:  11/30/14 209 lb 6.4 oz (94.983 kg)  11/02/14 205 lb 8 oz (93.214 kg)  10/19/14 206 lb (93.441 kg)    Vital signs reviewed       HEENT mild turbinate edema.  Oropharynx no thrush or excess pnd or cobblestoning.  No JVD or cervical adenopathy. Mild accessory muscle hypertrophy. Trachea midline, nl thryroid.  Chest was hyperinflated by percussion with diminished breath sounds and moderate increased exp time, no wheezing . Hoover sign positive at  mid inspiration. Regular rate and rhythm without murmur gallop or rub or increase P2 or edema.  Abd: no hsm, nl excursion. Ext warm without cyanosis or clubbing.      CXR  10/19/2014 : 1. No active cardiopulmonary abnormalities. 2. Hyperinflation.          Assessment & Plan:

## 2014-11-30 NOTE — Patient Instructions (Addendum)
Start dulera 100 Take 2 puffs first thing in am and then another 2 puffs about 12 hours later.    Work on inhaler technique:  relax and gently blow all the way out then take a nice smooth deep breath back in, triggering the inhaler at same time you start breathing in.  Hold for up to 5 seconds if you can.  Rinse and gargle with water when done    Get the prednisone right away  For cough > mucinex dm up to 1200 mg twice daily and supplement with tramadol 50 mg up to 2 every 4 hours  Change neurontin to 100 three times daily   Please schedule a follow up office visit in 2 weeks, sooner if needed

## 2014-12-03 NOTE — Assessment & Plan Note (Signed)
Trial off acei and on bystolic 45/0/3888 > good control  In setting of ? Ab Strongly prefer in this setting: Bystolic, the most beta -1  selective Beta blocker available in sample form, with bisoprolol the most selective generic choice  on the market.

## 2014-12-03 NOTE — Assessment & Plan Note (Addendum)
-   allergy profile 03/10/14  >  IgE 18 with neg RAST - sinus ct 10/25/2014 > Clear sinuses. - try off cozar 11/03/2014 >>>   Chronic cough is often simultaneously caused by more than one condition. A single cause has been found from 38 to 82% of the time, multiple causes from 18 to 62%. Multiply caused cough has been the result of three diseases up to 42% of the time.   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   These are the same pts (now being labeled as having "irritable larynx syndrome" by some cough centers) who not infrequently have a history of having failed to tolerate ace inhibitors,  dry powder inhalers or biphosphonates or report having atypical reflux symptoms (3/4 apply to her) that don't respond to standard doses of PPI , and are easily confused as having aecopd or asthma flares by even experienced allergists/ pulmonologists.  Will keep all acei and arb and continue max rx for gerd/ cyclical cough pending regroup in 2 weeks for med reconciliation.   To keep things simple, I have asked the patient to first separate medicines that are perceived as maintenance, that is to be taken daily "no matter what", from those medicines that are taken on only on an as-needed basis and I have given the patient examples of both, and then return to see our NP to generate a  detailed  medication calendar which should be followed until the next physician sees the patient and updates it.

## 2014-12-03 NOTE — Assessment & Plan Note (Signed)
PFT: 03/17/2014-  FEV1  1.28 (58%) ratio 66 and no better p saba, ERV 27 and DLCO 56 corrects to 77  The noct awakening suggests an AB component so needs a trial of effective laba / ics  The proper method of use, as well as anticipated side effects, of a metered-dose inhaler are discussed and demonstrated to the patient. Improved effectiveness after extensive coaching during this visit to a level of approximately  75% so try dulera 100 2bid x 2 weeks

## 2014-12-09 ENCOUNTER — Encounter: Payer: Self-pay | Admitting: Internal Medicine

## 2014-12-14 ENCOUNTER — Ambulatory Visit: Payer: Medicare Other | Admitting: Internal Medicine

## 2015-01-03 ENCOUNTER — Encounter: Payer: Self-pay | Admitting: Internal Medicine

## 2015-01-03 ENCOUNTER — Ambulatory Visit (INDEPENDENT_AMBULATORY_CARE_PROVIDER_SITE_OTHER): Payer: Medicare Other | Admitting: Internal Medicine

## 2015-01-03 VITALS — BP 142/84 | HR 63 | Temp 98.0°F | Ht 66.0 in | Wt 213.6 lb

## 2015-01-03 DIAGNOSIS — R05 Cough: Secondary | ICD-10-CM

## 2015-01-03 DIAGNOSIS — I10 Essential (primary) hypertension: Secondary | ICD-10-CM

## 2015-01-03 DIAGNOSIS — J449 Chronic obstructive pulmonary disease, unspecified: Secondary | ICD-10-CM

## 2015-01-03 DIAGNOSIS — R059 Cough, unspecified: Secondary | ICD-10-CM

## 2015-01-03 NOTE — Patient Instructions (Addendum)
Stay on neurontin (gabapentin) three times a day indefinitely   At first sign of flare > Pantoprazole (protonix) 40 mg   Take 30-60 min before first meal of the day and Pepcid 20 mg one bedtime until gone and return if needed  If all else fails and you are having trouble breathing try dulera 100 up to 2 every 12 hours    If you are satisfied with your treatment plan,  let your doctor know and he/she can either refill your medications or you can return here when your prescription runs out.     If in any way you are not 100% satisfied,  please tell us.  If 100% better, tell your friends!  Pulmonary follow up is as needed

## 2015-01-03 NOTE — Assessment & Plan Note (Signed)
Trial off acei and on bystolic 28/01/600 > good control> f/u primary care

## 2015-01-03 NOTE — Assessment & Plan Note (Addendum)
-   allergy profile 03/10/14  >  IgE 18 with neg RAST - sinus ct 10/25/2014 > Clear sinuses. 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  Please see instructions for details which were reviewed in writing and the patient given a copy.

## 2015-01-03 NOTE — Assessment & Plan Note (Signed)
Quit smoking 2007 PFT: 03/17/2014-  FEV1  1.28 (58%) ratio 66 and no better p saba, ERV 27 and DLCO 56 corrects to 77 - 11/30/2014 p extensive coaching HFA effectiveness =    75% so try dulera 100 2bid > 01/03/2015 not clear helping, ok to use prn   As I explained to this patient in detail:  although there may be copd present, it may not be clinically relevant:   it does not appear to be limiting activity tolerance any more than a set of worn tires limits someone from driving a car  around a parking lot.  A new set of Michelins might look good but would have no perceived impact on the performance of the car and would not be worth the cost.  That is to say:   this pt is so sedentary I don't recommend aggressive pulmonary rx at this point unless limiting symptoms arise or acute exacerbations become as issue, neither of which is the case now.  I asked the patient to contact this office at any time in the future should either of these problems arise and in meantime keep dulera 100 handy to use if breathing limits her or cough fails to respond to rx for gerd/cyclical coughing

## 2015-01-03 NOTE — Progress Notes (Addendum)
Subjective:    Patient ID: [name], female    DOB: Mar 01, 1946    MRN: [id number]    Brief patient profile:  51 yobf mother of a PA in [employer] quit smoking 2007 with chronic cough x 2011 self referred for evaluation of persistent cough in setting of technically GOLD II copd dx 02/2014 with only mild obstructed pattern - has noted since high school a tendency to tickle in throat causing intermittent cough waxing and waning ever since    History of Present Illness  10/19/2014 1st [facility] office visit/ Jasmarie Coppock   Chief Complaint  Patient presents with  . Pulmonary Consult    Former pt of Dr. Annamaria Boots. Pt c/o increased cough x 3 wks. Cough is prod with moderate, thick, light yellow sputum.  She coughs until loses urinary continence.  Not sleeping well due to cough.   remembers having tendency to throat tickle in high sschool  but completely resolved by graduation  then recurred in her 60s and resolved with codeine then recurred in 2011  but bad to worse x 3 weeks assoc with sensation of choking, mucus is thick light yellow.  acei stopped about a month prior to OV  Replaced by losartan  Inhalers make it worse, esp dpi    Kouffman Reflux v Neurogenic Cough Differentiator Reflux Comments  Do you awaken from a sound sleep coughing violently?                            With trouble breathing? Yes   Do you have choking episodes when you cannot  Get enough air, gasping for air ?              Yes   Do you usually cough when you lie down into  The bed, or when you just lie down to rest ?                          Yes   Do you usually cough after meals or eating?         maybe   Do you cough when (or after) you bend over?    No    GERD SCORE     Kouffman Reflux v Neurogenic Cough Differentiator Neurogenic   Do you more-or-less cough all day long? yes   Does change of temperature make you cough? no   Does laughing or chuckling cause you to cough? no   Do fumes (perfume, automobile fumes,  burned  Toast, etc.,) cause you to cough ?      Not aware   Does speaking, singing, or talking on the phone cause you to cough   ?               sometimes   Neurogenic/Airway score       rec  schedule sinus CT > neg  First take delsym two tsp every 12 hours and supplement if needed with  tramadol 50 mg up to 2 every 4 hours to suppress the urge to cough at all or even clear your throat.  Prednisone 10 mg take  4 each am x 2 days,   2 each am x 2 days,  1 each am x 2 days and stop (this is to eliminate allergies and inflammation from coughing) Protonix (pantoprazole) Take 30-60 min before first meal of the day and Pepcid 20 mg one bedtime plus chlorpheniramine  4 mg x 2 at bedtime (both available over the counter)    11/03/2014 f/u ov/Julius Matus re: cough x 5 weeks/ Mild COPD  Chief Complaint  Patient presents with  . Follow-up    Pt states that cough is some better.  Still coughing up minimal white, foamy sputum.  No new co's today.    >off ACE and rx bystolic   95/62/13 Follow up for Cough /Mild COPD  Pt returns for 2 week follow up and med review .  We reviewed all her medications organize them into a medication count with patient education Appears that she is taking her medications correctly Last visit. She was stopped off of her ACE inhibitor due to cough She was given Bystolic for blood pressure control Blood pressure is well-controlled today. She is tolerating medication well She returns without any significant change in her shortness of breath or cough. Chest x-ray showed no acute process, CT sinus showed no acute sinusitis. rec Add Allergy Relief (Chlortrimeton 4mg  ) 2 At bedtime   May use 1 extra allergy relief every 4hr as needed for throat clearing .  Continue with cough control with delsym and tramadol.  Continue on Bystolic 5mg  daily   11/30/2014 f/u ov/Zymir Napoli re: chronic cough  Chief Complaint  Patient presents with  . Acute Visit    Pt states that her cough is no better  since last visit here 11/18/14. She also c/o frequent throat clearing and "rattling in throat". She states that there have been 3 occasions since last visit where she woke up from sleeping with cough and "felt like choking".   Overall pattern has not improved on rx unless includes tramadol  though not clear that she's been able to take the complex regimen  We've recommended and has not yet started the prednisone that was recommended over the phone  rec Start dulera 100 Take 2 puffs first thing in am and then another 2 puffs about 12 hours later.  Work on inhaler technique:  Get the prednisone right away For cough > mucinex dm up to 1200 mg twice daily and supplement with tramadol 50 mg up to 2 every 4 hours if needed  Change neurontin to 100 three times daily     01/03/2015 f/u ov/Sajjad Honea re: cough since 2011/ GOLD II COPD / tickle since childhood  Chief Complaint  Patient presents with  . Follow-up    cough has resolved; no concerns today   prednisone may have helped  This time / used it with tramadol but no more tramadol x weeks and no flare  May have caught cold xsev days but so far just controlling cough with candy/ ? Better on dulera > note sure, may have caused choking  Stopped acid suppression/ the dulera but still on neurontin 100 tid.   No obvious day to day or daytime variabilty or assoc limiting sob or excess or purulent sputum or cp or chest tightness, subjective wheeze overt sinus or hb symptoms. No unusual exp hx or h/o childhood pna/ asthma or knowledge of premature birth.    Also denies any obvious fluctuation of symptoms with weather or environmental changes or other aggravating or alleviating factors except as outlined above   Current Medications, Allergies, Complete Past Medical History, Past Surgical History, Family History, and Social History were reviewed in [employer] [employer] record.  ROS  The following are not active complaints unless bolded sore throat,  dysphagia, dental problems, itching, sneezing,  nasal congestion or excess/ purulent secretions,  ear ache,   fever, chills, sweats, unintended wt loss, pleuritic or exertional cp, hemoptysis,  orthopnea pnd or leg swelling, presyncope, palpitations, heartburn, abdominal pain, anorexia, nausea, vomiting, diarrhea  or change in bowel or urinary habits, change in stools or urine, dysuria,hematuria,  rash, arthralgias, visual complaints, headache, numbness weakness or ataxia or problems with walking or coordination,  change in mood/affect or memory.                               Objective:   Physical Exam  amb bf mildly  Hoarse / life saver in mouth    01/03/2015              Wt Readings from Last 3 Encounters:  11/30/14 209 lb 6.4 oz (94.983 kg)  11/02/14 205 lb 8 oz (93.214 kg)  10/19/14 206 lb (93.441 kg)    Vital signs reviewed      HEENT: nl dentition, turbinates, and orophanx. Nl external ear canals without cough reflex   NECK :  without JVD/Nodes/TM/ nl carotid upstrokes bilaterally   LUNGS: no acc muscle use, clear to A and P bilaterally without cough on insp or exp maneuvers   CV:  RRR  no s3 or murmur or increase in P2, no edema   ABD:  soft and nontender with nl excursion in the supine position. No bruits or organomegaly, bowel sounds nl  MS:  warm without deformities, calf tenderness, cyanosis or clubbing  SKIN: warm and dry without lesions    NEURO:  alert, approp, no deficits       CXR  10/19/2014 : 1. No active cardiopulmonary abnormalities. 2. Hyperinflation.          Assessment & Plan:   Outpatient Encounter Prescriptions as of 01/03/2015  Medication Sig  . aspirin 81 MG tablet Take 81 mg by mouth daily.   . Biotin 2500 MCG CAPS Take 1 capsule by mouth daily.  . calcium citrate-vitamin D (CITRACAL+D) 315-200 MG-UNIT per tablet Take 1 tablet by mouth daily.  . Cholecalciferol (VITAMIN D3) 2000 UNITS TABS Take 2,000 Units by mouth daily.  Marland Kitchen  gabapentin (NEURONTIN) 100 MG capsule Take three times daily  . mometasone-formoterol (DULERA) 100-5 MCG/ACT AERO Inhale 2 puffs into the lungs 2 (two) times daily.  . montelukast (SINGULAIR) 10 MG tablet Take 1 tablet (10 mg total) by mouth at bedtime.  . nebivolol (BYSTOLIC) 5 MG tablet Take 1 tablet (5 mg total) by mouth daily.  . rosuvastatin (CRESTOR) 20 MG tablet Take 20 mg by mouth daily.  . sitaGLIPtin (JANUVIA) 100 MG tablet Take 100 mg by mouth daily.  . traMADol (ULTRAM) 50 MG tablet 1-2 every 4 hours as needed for cough or pain  . [DISCONTINUED] dextromethorphan (DELSYM) 30 MG/5ML liquid 2 tsp twice daily as needed for cough  . [DISCONTINUED] dextromethorphan-guaiFENesin (MUCINEX DM) 30-600 MG per 12 hr tablet Take 1 tablet by mouth 2 (two) times daily.  . [DISCONTINUED] baclofen (LIORESAL) 10 MG tablet Take 10 mg by mouth 2 (two) times daily as needed for muscle spasms.  . [DISCONTINUED] chlorpheniramine (CHLOR-TRIMETON) 4 MG tablet 2 at bedtime  . [DISCONTINUED] famotidine (PEPCID) 20 MG tablet One at bedtime (Patient not taking: Reported on 01/03/2015)  . [DISCONTINUED] pantoprazole (PROTONIX) 40 MG tablet Take 1 tablet (40 mg total) by mouth daily. Take 30-60 min before first meal of the day (Patient not taking: Reported on 01/03/2015)  . [DISCONTINUED] predniSONE (DELTASONE)  10 MG tablet 4 x 2 days, 2 x 2 days, 1 x 2 days, then stop (Patient not taking: Reported on 11/30/2014)

## 2015-01-04 ENCOUNTER — Encounter: Payer: Self-pay | Admitting: Internal Medicine

## 2015-01-05 NOTE — Telephone Encounter (Signed)
Per 1.6.16 mychart message from pt: Message     I just realized that during my last appointment on Tuesday, we didn't discuss if I should continue taking Bystolic. I previously mentioned that this prescription was very expensive so if I am to continue this type med, I would need something less expensive. FYI, I am completely out of this med. Thank you.   Per the 1.5.16 ov w/ MW: Essential hypertension - Tanda Rockers, MD at 01/03/2015 11:47 AM     Status: Written Related Problem: Essential hypertension   Expand All Collapse All   Trial off acei and on bystolic 72/04/3663 > good control> f/u primary care      Dr [name] please advise if you would like to change pt's Bystolic to something more affordable or would you rather she contact her PCP for this issue?  Please advise, thank you.  **she is completely out of this medication**

## 2015-01-06 MED ORDER — BISOPROLOL FUMARATE 5 MG PO TABS: 2.5000 mg | ORAL_TABLET | Freq: Every day | ORAL | Status: DC

## 2015-01-06 NOTE — Telephone Encounter (Signed)
Spoke with MW this morning as the response he gave in EPIC yesterday can not be found. He wants patient to get Bisoprolol 5 mg take 2.5 mg daily. I have sent message to patient about his and sent to pharmacy she gave in e-mail.

## 2015-01-22 ENCOUNTER — Other Ambulatory Visit: Payer: Self-pay | Admitting: Internal Medicine

## 2015-02-01 ENCOUNTER — Encounter: Payer: Self-pay | Admitting: Internal Medicine

## 2015-02-02 NOTE — Telephone Encounter (Signed)
Please advise MW thanks  No Known Allergies

## 2015-02-06 ENCOUNTER — Telehealth: Payer: Self-pay | Admitting: Internal Medicine

## 2015-02-06 DIAGNOSIS — R05 Cough: Secondary | ICD-10-CM

## 2015-02-06 DIAGNOSIS — R059 Cough, unspecified: Secondary | ICD-10-CM

## 2015-02-06 NOTE — Telephone Encounter (Signed)
Pt returned call. Sinus CT order. Pt verbalized understanding. Pt will need an appt with MW after finding out when CT scan is scheduled.   PCC's please advise when CT is scheduled.

## 2015-02-06 NOTE — Telephone Encounter (Signed)
lmtcb for pt.    Per pt email on 02/02/15:  We did a sinus CT for exactly that reason back on oct 17 that was totally normal but if you had teeth pulled in the meantime I would stronlgy recommend we repeat the ct sinus now (it shows the uper teeth sockets too) and have you return same day with all your medications so we can regroup with your longterm treatment - call and ask to speak to [place] and she will set this up   Good luck!   Dr [name]

## 2015-02-07 ENCOUNTER — Encounter: Payer: Self-pay | Admitting: Internal Medicine

## 2015-02-07 DIAGNOSIS — R05 Cough: Secondary | ICD-10-CM

## 2015-02-07 DIAGNOSIS — R059 Cough, unspecified: Secondary | ICD-10-CM

## 2015-02-07 NOTE — Telephone Encounter (Signed)
Called & spoke with pt.  She is aware of CT appt @[facility]  02/17/15 arrival @11 :15 am, given address & phone number for that office.  Pt also transferred to [facility] to schedule ROV with Dr. Huntley Estelle

## 2015-02-07 NOTE — Telephone Encounter (Signed)
Nothing further needed 

## 2015-02-08 DIAGNOSIS — N182 Chronic kidney disease, stage 2 (mild): Secondary | ICD-10-CM | POA: Diagnosis not present

## 2015-02-08 DIAGNOSIS — I129 Hypertensive chronic kidney disease with stage 1 through stage 4 chronic kidney disease, or unspecified chronic kidney disease: Secondary | ICD-10-CM | POA: Diagnosis not present

## 2015-02-08 DIAGNOSIS — Z0000 Encounter for general adult medical examination without abnormal findings: Secondary | ICD-10-CM | POA: Diagnosis not present

## 2015-02-08 DIAGNOSIS — E1121 Type 2 diabetes mellitus with diabetic nephropathy: Secondary | ICD-10-CM | POA: Diagnosis not present

## 2015-02-08 DIAGNOSIS — E559 Vitamin D deficiency, unspecified: Secondary | ICD-10-CM | POA: Diagnosis not present

## 2015-02-08 NOTE — Telephone Encounter (Signed)
Dr [name], pt is requesting a refill on tramadol for her coughing.  Last filled on 11/30/14 #60.  Are you ok with this refill?  Thanks!

## 2015-02-09 MED ORDER — TRAMADOL HCL 50 MG PO TABS: ORAL_TABLET | ORAL | Status: DC

## 2015-02-09 NOTE — Telephone Encounter (Signed)
MW please advise on refill. Thanks.

## 2015-02-17 ENCOUNTER — Ambulatory Visit (INDEPENDENT_AMBULATORY_CARE_PROVIDER_SITE_OTHER)
Admission: RE | Admit: 2015-02-17 | Discharge: 2015-02-17 | Disposition: A | Discharge status: Home / Self Care | Payer: Medicare Other | Source: Ambulatory Visit | Attending: Internal Medicine | Admitting: Internal Medicine

## 2015-02-17 DIAGNOSIS — R05 Cough: Secondary | ICD-10-CM

## 2015-02-17 DIAGNOSIS — R059 Cough, unspecified: Secondary | ICD-10-CM

## 2015-02-20 ENCOUNTER — Telehealth: Payer: Self-pay | Admitting: Internal Medicine

## 2015-02-20 ENCOUNTER — Encounter: Payer: Self-pay | Admitting: Internal Medicine

## 2015-02-20 NOTE — Progress Notes (Signed)
Quick Note:  LMTCB ______ 

## 2015-02-20 NOTE — Telephone Encounter (Signed)
Notes Recorded by Tanda Rockers, MD on 02/18/2015 at 5:44 PM Call patient : Study is unremarkable, be sure has f/u with all meds in hand if still coughing -- Spoke with pt. She is aware of her results. States that she has an OV tomorrow and doesn't feel she needs it since her CT scan was essentially normal. OV will be canceled per her request.

## 2015-02-21 ENCOUNTER — Ambulatory Visit: Payer: Medicare Other | Admitting: Internal Medicine

## 2015-02-22 NOTE — Progress Notes (Signed)
Quick Note:  LMTCB ______ 

## 2015-02-25 ENCOUNTER — Other Ambulatory Visit: Payer: Self-pay | Admitting: Internal Medicine

## 2015-03-02 DIAGNOSIS — N959 Unspecified menopausal and perimenopausal disorder: Secondary | ICD-10-CM | POA: Diagnosis not present

## 2015-03-02 DIAGNOSIS — Z13820 Encounter for screening for osteoporosis: Secondary | ICD-10-CM | POA: Diagnosis not present

## 2015-03-16 ENCOUNTER — Other Ambulatory Visit: Payer: Self-pay | Admitting: Internal Medicine

## 2015-04-03 ENCOUNTER — Encounter: Payer: Self-pay | Admitting: Internal Medicine

## 2015-04-07 ENCOUNTER — Encounter: Payer: Self-pay | Admitting: Internal Medicine

## 2015-04-07 ENCOUNTER — Ambulatory Visit (INDEPENDENT_AMBULATORY_CARE_PROVIDER_SITE_OTHER): Payer: Medicare Other | Admitting: Internal Medicine

## 2015-04-07 VITALS — BP 142/88 | HR 80 | Ht 66.0 in | Wt 207.8 lb

## 2015-04-07 DIAGNOSIS — J449 Chronic obstructive pulmonary disease, unspecified: Secondary | ICD-10-CM | POA: Diagnosis not present

## 2015-04-07 DIAGNOSIS — R05 Cough: Secondary | ICD-10-CM

## 2015-04-07 DIAGNOSIS — R059 Cough, unspecified: Secondary | ICD-10-CM

## 2015-04-07 MED ORDER — TRAMADOL HCL 50 MG PO TABS: ORAL_TABLET | ORAL | Status: DC

## 2015-04-07 MED ORDER — PREDNISONE 10 MG PO TABS: ORAL_TABLET | ORAL | Status: DC

## 2015-04-07 MED ORDER — MOMETASONE FURO-FORMOTEROL FUM 100-5 MCG/ACT IN AERO: INHALATION_SPRAY | RESPIRATORY_TRACT | Status: DC

## 2015-04-07 NOTE — Assessment & Plan Note (Addendum)
Quit smoking 2007 PFT: 03/17/2014-  FEV1  1.28 (58%) ratio 66 and no better p saba, ERV 27 and DLCO 56 corrects to 77 - 11/30/2014   try dulera 100 2bid > 01/03/2015 not clear helping,  use prn > flare since mid 02/2015 cough > sob  - 04/07/2015 p extensive coaching HFA effectiveness =    90% > try dulera 100 2bid not prn    DDX of  difficult airways management all start with A and  include Adherence, Ace Inhibitors, Acid Reflux, Active Sinus Disease, Alpha 1 Antitripsin deficiency, Anxiety masquerading as Airways dz,  ABPA,  allergy(esp in young), Aspiration (esp in elderly), Adverse effects of DPI,  Active smokers, plus two Bs  = Bronchiectasis and Beta blocker use..and one C= CHF  Adherence is always the initial "prime suspect" and is a multilayered concern that requires a "trust but verify" approach in every patient - starting with knowing how to use medications, especially inhalers, correctly, keeping up with refills and understanding the fundamental difference between maintenance and prns vs those medications only taken for a very short course and then stopped and not refilled.  The proper method of use, as well as anticipated side effects, of a metered-dose inhaler are discussed and demonstrated to the patient. Improved effectiveness after extensive coaching during this visit to a level of approximately  90% so  Will ask her to use the dulera 100 2bid not prn x 2 weeks  ? Allergy > continue singulair though not clear it's really helping and if not better p adding dulera add prednisone x 6 days and consider d/c singulair p allergy season   ? Acid (or non-acid) GERD > always difficult to exclude as up to 75% of pts in some series report no assoc GI/ Heartburn symptoms> rec continue max (24h)  acid suppression and diet restrictions/ reviewed

## 2015-04-07 NOTE — Patient Instructions (Addendum)
Start dulera 100 sample Take 2 puffs first thing in am and then another 2 puffs about 12 hours later.   Take delsym two tsp every 12 hours and supplement if needed with  tramadol 50 mg up to 2 every 4 hours to suppress the urge to cough. Swallowing water or using ice chips/non mint and menthol containing candies (such as lifesavers or sugarless jolly ranchers) are also effective.  You should rest your voice and avoid activities that you know make you cough.  Once you have eliminated the cough for 3 straight days try reducing the tramadol first,  then the delsym as tolerated.    If not improving > Prednisone 10 mg take  4 each am x 2 days,   2 each am x 2 days,  1 each am x 2 days and stop   Please schedule a follow up office visit in 2 weeks, sooner if needed  Add consider d/c singulair p allergy season

## 2015-04-07 NOTE — Assessment & Plan Note (Addendum)
-   allergy profile 03/10/14  >  IgE 18 with neg RAST - sinus ct 10/25/2014 > Clear sinuses. - try off cozar 11/03/2014 >>> - neurontin 100 tid rx 11/30/14 > improved 01/03/2015  - sinus CT 02/17/15 > neg    Add prn tramadol for cyclical cough   I had an extended discussion with the patient reviewing all relevant studies completed to date and  lasting 15 to 20 minutes of a 25 minute visit on the following ongoing concerns:   1) Each maintenance medication was reviewed in detail including most importantly the difference between maintenance and as needed and under what circumstances the prns are to be used.  Please see instructions for details which were reviewed in writing and the patient given a copy.

## 2015-04-07 NOTE — Progress Notes (Signed)
Subjective:    Patient ID: [name], female    DOB: 1946-08-17    MRN: [id number]    Brief patient profile:  52 yobf mother of a PA in [employer] quit smoking 2007 with chronic cough x 2011 self referred for evaluation of persistent cough in setting of technically GOLD II copd dx 02/2014 with only mild obstructed pattern - has noted since high school a tendency to tickle in throat causing intermittent cough waxing and waning ever since    History of Present Illness  10/19/2014 1st [facility] office visit/ [name]   Chief Complaint  Patient presents with  . Pulmonary Consult    Former pt of Dr. Annamaria Boots. Pt c/o increased cough x 3 wks. Cough is prod with moderate, thick, light yellow sputum.  She coughs until loses urinary continence.  Not sleeping well due to cough.   remembers having tendency to throat tickle in high sschool  but completely resolved by graduation  then recurred in her 49s and resolved with codeine then recurred in 2011  but bad to worse x 3 weeks assoc with sensation of choking, mucus is thick light yellow.  acei stopped about a month prior to OV  Replaced by losartan  Inhalers make it worse, esp dpi    Kouffman Reflux v Neurogenic Cough Differentiator Reflux Comments  Do you awaken from a sound sleep coughing violently?                            With trouble breathing? Yes   Do you have choking episodes when you cannot  Get enough air, gasping for air ?              Yes   Do you usually cough when you lie down into  The bed, or when you just lie down to rest ?                          Yes   Do you usually cough after meals or eating?         maybe   Do you cough when (or after) you bend over?    No    GERD SCORE     Kouffman Reflux v Neurogenic Cough Differentiator Neurogenic   Do you more-or-less cough all day long? yes   Does change of temperature make you cough? no   Does laughing or chuckling cause you to cough? no   Do fumes (perfume, automobile fumes,  burned  Toast, etc.,) cause you to cough ?      Not aware   Does speaking, singing, or talking on the phone cause you to cough   ?               sometimes   Neurogenic/Airway score       rec  schedule sinus CT > neg  First take delsym two tsp every 12 hours and supplement if needed with  tramadol 50 mg up to 2 every 4 hours to suppress the urge to cough at all or even clear your throat.  Prednisone 10 mg take  4 each am x 2 days,   2 each am x 2 days,  1 each am x 2 days and stop (this is to eliminate allergies and inflammation from coughing) Protonix (pantoprazole) Take 30-60 min before first meal of the day and Pepcid 20 mg one bedtime plus chlorpheniramine  4 mg x 2 at bedtime (both available over the counter)    11/03/2014 f/u ov/[name] re: cough x 5 weeks/ Mild COPD  Chief Complaint  Patient presents with  . Follow-up    Pt states that cough is some better.  Still coughing up minimal white, foamy sputum.  No new co's today.    >off ACE and rx bystolic   16/96/78 Follow up for Cough /Mild COPD  Pt returns for 2 week follow up and med review .  We reviewed all her medications organize them into a medication count with patient education Appears that she is taking her medications correctly Last visit. She was stopped off of her ACE inhibitor due to cough She was given Bystolic for blood pressure control Blood pressure is well-controlled today. She is tolerating medication well She returns without any significant change in her shortness of breath or cough. Chest x-ray showed no acute process, CT sinus showed no acute sinusitis. rec Add Allergy Relief (Chlortrimeton 4mg  ) 2 At bedtime   May use 1 extra allergy relief every 4hr as needed for throat clearing .  Continue with cough control with delsym and tramadol.  Continue on Bystolic 5mg  daily   11/30/2014 f/u ov/[name] re: chronic cough  Chief Complaint  Patient presents with  . Acute Visit    Pt states that her cough is no better  since last visit here 11/18/14. She also c/o frequent throat clearing and "rattling in throat". She states that there have been 3 occasions since last visit where she woke up from sleeping with cough and "felt like choking".   Overall pattern has not improved on rx unless includes tramadol  though not clear that she's been able to take the complex regimen  We've recommended and has not yet started the prednisone that was recommended over the phone  rec Start dulera 100 Take 2 puffs first thing in am and then another 2 puffs about 12 hours later.  Work on inhaler technique:  Get the prednisone right away For cough > mucinex dm up to 1200 mg twice daily and supplement with tramadol 50 mg up to 2 every 4 hours if needed  Change neurontin to 100 three times daily     01/03/2015 f/u ov/[name] re: cough since 2011/ GOLD II COPD / tickle since childhood  Chief Complaint  Patient presents with  . Follow-up    cough has resolved; no concerns today   prednisone may have helped  This time / used it with tramadol but no more tramadol x weeks and no flare  May have caught cold xsev days but so far just controlling cough with candy/ ? Better on dulera > note sure, may have caused choking  Stopped acid suppression/  dulera but still on neurontin 100 tid. rec Stay on neurontin (gabapentin) three times a day indefinitely  At first sign of flare > Pantoprazole (protonix) 40 mg   Take 30-60 min before first meal of the day and Pepcid 20 mg one bedtime until gone and return if needed If all else fails and you are having trouble breathing try dulera 100 up to 2 every 12 hours  F/u is prn   04/07/2015 f/u ov/[name] re: recurrent cough/ wheeze  Chief Complaint  Patient presents with  . Acute Visit    Pt states cough started back approx 1 month ago- occ prod with minimal, foamy, white sputum.  She also c/o SOB and chest tightness when she has a coughing fit.  onset of flare was gradual/ pattern is intermittent  daytime but wakes up in am now wheezing / coughing but not usually prematurely    Never stopped the gerd rx and maintained on singulair / did not try the dulera when symptoms flared    No obvious day to day or daytime variabilty or assoc limiting sob unless coughing  or excess or purulent sputum or cp or chest tightness, subjective wheeze overt sinus or hb symptoms. No unusual exp hx or h/o childhood pna/ asthma or knowledge of premature birth.    Also denies any obvious fluctuation of symptoms with weather or environmental changes or other aggravating or alleviating factors except as outlined above   Current Medications, Allergies, Complete Past Medical History, Past Surgical History, Family History, and Social History were reviewed in [employer] record.  ROS  The following are not active complaints unless bolded sore throat, dysphagia, dental problems, itching, sneezing,  nasal congestion or excess/ purulent secretions, ear ache,   fever, chills, sweats, unintended wt loss, pleuritic or exertional cp, hemoptysis,  orthopnea pnd or leg swelling, presyncope, palpitations, heartburn, abdominal pain, anorexia, nausea, vomiting, diarrhea  or change in bowel or urinary habits, change in stools or urine, dysuria,hematuria,  rash, arthralgias, visual complaints, headache, numbness weakness or ataxia or problems with walking or coordination,  change in mood/affect or memory.                               Objective:   Physical Exam  amb bf nad    04/07/2015           207   Wt Readings from Last 3 Encounters:  11/30/14 209 lb 6.4 oz (94.983 kg)  11/02/14 205 lb 8 oz (93.214 kg)  10/19/14 206 lb (93.441 kg)    Vital signs reviewed      HEENT: nl dentition, turbinates, and orophanx. Nl external ear canals without cough reflex   NECK :  without JVD/Nodes/TM/ nl carotid upstrokes bilaterally   LUNGS: no acc muscle use, clear to A and P bilaterally without cough on  insp or exp maneuvers   CV:  RRR  no s3 or murmur or increase in P2, no edema   ABD:  soft and nontender with nl excursion in the supine position. No bruits or organomegaly, bowel sounds nl  MS:  warm without deformities, calf tenderness, cyanosis or clubbing  SKIN: warm and dry without lesions    NEURO:  alert, approp, no deficits       CXR  10/19/2014 : 1. No active cardiopulmonary abnormalities. 2. Hyperinflation.          Assessment & Plan:

## 2015-04-19 DIAGNOSIS — R946 Abnormal results of thyroid function studies: Secondary | ICD-10-CM | POA: Diagnosis not present

## 2015-04-19 DIAGNOSIS — Z0000 Encounter for general adult medical examination without abnormal findings: Secondary | ICD-10-CM | POA: Diagnosis not present

## 2015-04-20 DIAGNOSIS — R609 Edema, unspecified: Secondary | ICD-10-CM | POA: Diagnosis not present

## 2015-04-20 DIAGNOSIS — M545 Low back pain: Secondary | ICD-10-CM | POA: Diagnosis not present

## 2015-04-21 ENCOUNTER — Ambulatory Visit (INDEPENDENT_AMBULATORY_CARE_PROVIDER_SITE_OTHER): Payer: Medicare Other | Admitting: Internal Medicine

## 2015-04-21 ENCOUNTER — Encounter: Payer: Self-pay | Admitting: Internal Medicine

## 2015-04-21 VITALS — BP 130/68 | HR 72 | Ht 64.0 in | Wt 208.0 lb

## 2015-04-21 DIAGNOSIS — R05 Cough: Secondary | ICD-10-CM

## 2015-04-21 DIAGNOSIS — J449 Chronic obstructive pulmonary disease, unspecified: Secondary | ICD-10-CM | POA: Diagnosis not present

## 2015-04-21 DIAGNOSIS — R059 Cough, unspecified: Secondary | ICD-10-CM

## 2015-04-21 MED ORDER — MOMETASONE FURO-FORMOTEROL FUM 100-5 MCG/ACT IN AERO: INHALATION_SPRAY | RESPIRATORY_TRACT | Status: DC

## 2015-04-21 NOTE — Patient Instructions (Addendum)
Stop chloretrimeton and take zyrtec 10 mg at bedtime as needed for itching/ sneezing/runny nose, post nasal drainage - good x 24 hours   Continue dulera 100 Take 2 puffs first thing in am and then another 2 puffs about 12 hours later.   Please schedule a follow up office visit in 6 weeks, call sooner if needed

## 2015-04-21 NOTE — Progress Notes (Signed)
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 Pulmonary Consult    Former pt of Dr. Annamaria Boots. Pt c/o increased cough x 3 wks. Cough is prod with moderate, thick, light yellow sputum.  She coughs until loses urinary continence.  Not sleeping well due to cough.   remembers having tendency to throat tickle in high sschool  but completely resolved by graduation  then recurred in her 60s and resolved with codeine then recurred in 2011  but bad to worse x 3 weeks assoc with sensation of choking, mucus is thick light yellow.  acei stopped about a month prior to OV  Replaced by losartan  Inhalers make it worse, esp dpi    Kouffman Reflux v Neurogenic Cough Differentiator Reflux Comments  Do you awaken from a sound sleep coughing violently?                            With trouble breathing? Yes   Do you have choking episodes when you cannot  Get enough air, gasping for air ?              Yes   Do you usually cough when you lie down into  The bed, or when you just lie down to rest ?                          Yes   Do you usually cough after meals or eating?         maybe   Do you cough when (or after) you bend over?    No    GERD SCORE     Kouffman Reflux v Neurogenic Cough Differentiator Neurogenic   Do you more-or-less cough all day long? yes   Does change of temperature make you cough? no   Does laughing or chuckling cause you to cough? no   Do fumes (perfume, automobile fumes,  burned  Toast, etc.,) cause you to cough ?      Not aware   Does speaking, singing, or talking on the phone cause you to cough   ?               sometimes   Neurogenic/Airway score       rec  schedule sinus CT > neg  First take delsym two tsp every 12 hours and supplement if needed with  tramadol 50 mg up to 2 every 4 hours to suppress the urge to cough at all or even clear your throat.  Prednisone 10 mg take  4 each am x 2 days,   2 each am x 2 days,  1 each am x 2 days and stop (this is to eliminate allergies and inflammation from coughing) Protonix (pantoprazole) Take 30-60 min before first meal of the day and Pepcid 20 mg one bedtime plus chlorpheniramine  4 mg x 2 at bedtime (both available over the counter)    11/03/2014 f/u ov/[name] re: cough x 5 weeks/ Mild COPD  Chief Complaint  Patient presents with  . Follow-up    Pt states that cough is some better.  Still coughing up minimal white, foamy sputum.  No new co's today.    >off ACE and rx bystolic   16/96/78 Follow up for Cough /Mild COPD  Pt returns for 2 week follow up and med review .  We reviewed all her medications organize them into a medication count with patient education Appears that she is taking her medications correctly Last visit. She was stopped off of her ACE inhibitor due to cough She was given Bystolic for blood pressure control Blood pressure is well-controlled today. She is tolerating medication well She returns without any significant change in her shortness of breath or cough. Chest x-ray showed no acute process, CT sinus showed no acute sinusitis. rec Add Allergy Relief (Chlortrimeton 4mg  ) 2 At bedtime   May use 1 extra allergy relief every 4hr as needed for throat clearing .  Continue with cough control with delsym and tramadol.  Continue on Bystolic 5mg  daily   11/30/2014 f/u ov/[name] re: chronic cough  Chief Complaint  Patient presents with  . Acute Visit    Pt states that her cough is no better  since last visit here 11/18/14. She also c/o frequent throat clearing and "rattling in throat". She states that there have been 3 occasions since last visit where she woke up from sleeping with cough and "felt like choking".   Overall pattern has not improved on rx unless includes tramadol  though not clear that she's been able to take the complex regimen  We've recommended and has not yet started the prednisone that was recommended over the phone  rec Start dulera 100 Take 2 puffs first thing in am and then another 2 puffs about 12 hours later.  Work on inhaler technique:  Get the prednisone right away For cough > mucinex dm up to 1200 mg twice daily and supplement with tramadol 50 mg up to 2 every 4 hours if needed  Change neurontin to 100 three times daily     01/03/2015 f/u ov/[name] re: cough since 2011/ GOLD II COPD / tickle since childhood  Chief Complaint  Patient presents with  . Follow-up    cough has resolved; no concerns today   prednisone may have helped  This time / used it with tramadol but no more tramadol x weeks and no flare  May have caught cold xsev days but so far just controlling cough with candy/ ? Better on dulera > note sure, may have caused choking  Stopped acid suppression/  dulera but still on neurontin 100 tid. rec Stay on neurontin (gabapentin) three times a day indefinitely  At first sign of flare > Pantoprazole (protonix) 40 mg   Take 30-60 min before first meal of the day and Pepcid 20 mg one bedtime until gone and return if needed If all else fails and you are having trouble breathing try dulera 100 up to 2 every 12 hours  F/u is prn   04/07/2015 f/u ov/[name] re: recurrent cough/ wheeze  Chief Complaint  Patient presents with  . Acute Visit    Pt states cough started back approx 1 month ago- occ prod with minimal, foamy, white sputum.  She also c/o SOB and chest tightness when she has a coughing fit.  onset of flare was gradual/ pattern is intermittent  daytime but wakes up in am now wheezing / coughing but not usually prematurely    Never stopped the gerd rx and maintained on singulair / did not try the dulera when symptoms flared rec Start dulera 100 sample Take 2 puffs first thing in am and then another 2 puffs about 12 hours later.  Take delsym two tsp every 12 hours and supplement if needed with  tramadol 50 mg up to 2 every 4 hours to suppress the urge to cough  If not improving > Prednisone 10 mg take  4 each am x 2 days,   2 each am x 2 days,  1 each am x 2 days and stop  Please schedule a follow up office visit in 2 weeks, sooner if needed  Add consider d/c singulair p allergy season      04/21/2015 f/u ov/[name] re: chronic cough/ likely multifactorial better on dulera 100 2bid Chief Complaint  Patient presents with  . Follow-up    Pt states that chest tightness and SOB have resolved. Her cough is much better. No new co's today.     No longer needing any tramadol/ never took the prednisone  Main issue is excess drowsiness on 1st gen h1   Not limited by breathing from desired activities     No obvious day to day or daytime variabilty or assoc  excess or purulent sputum or cp or chest tightness, subjective wheeze overt sinus or hb symptoms. No unusual exp hx or h/o childhood pna/ asthma or knowledge of premature birth.   Also denies any obvious fluctuation of symptoms with weather or environmental changes or other aggravating or alleviating factors except as outlined above   Current Medications, Allergies, Complete Past Medical History, Past Surgical History, Family History, and Social History were reviewed in [employer] record.  ROS  The following are not active complaints unless bolded sore throat, dysphagia, dental problems, itching, sneezing,  nasal congestion or excess/ purulent secretions, ear ache,   fever, chills, sweats, unintended wt loss, pleuritic or exertional cp, hemoptysis,  orthopnea pnd or  leg swelling, presyncope, palpitations, heartburn, abdominal pain, anorexia, nausea, vomiting, diarrhea  or change in bowel or urinary habits, change in stools or urine, dysuria,hematuria,  rash, arthralgias, visual complaints, headache, numbness weakness or ataxia or problems with walking or coordination,  change in mood/affect or memory.                  Objective:   Physical Exam  amb bf nad    04/07/2015           207  > 04/21/2015 208  Wt Readings from Last 3 Encounters:  11/30/14 209 lb 6.4 oz (94.983 kg)  11/02/14 205 lb 8 oz (93.214 kg)  10/19/14 206 lb (93.441 kg)    Vital signs reviewed      HEENT: nl dentition, turbinates, and orophanx. Nl external ear canals without cough reflex   NECK :  without JVD/Nodes/TM/ nl carotid upstrokes bilaterally   LUNGS: no acc muscle use, clear to A and P bilaterally without cough on insp or exp maneuvers   CV:  RRR  no s3 or murmur or increase in P2, no edema   ABD:  soft and nontender with nl excursion in the supine position. No bruits or organomegaly, bowel sounds nl  MS:  warm without deformities, calf tenderness, cyanosis or clubbing  SKIN: warm and dry without lesions  NEURO:  alert, approp, no deficits       CXR  10/19/2014 : 1. No active cardiopulmonary abnormalities. 2. Hyperinflation.          Assessment & Plan:

## 2015-04-22 ENCOUNTER — Encounter: Payer: Self-pay | Admitting: Internal Medicine

## 2015-04-22 NOTE — Assessment & Plan Note (Addendum)
Quit smoking 2007 PFT: 03/17/2014-  FEV1  1.28 (58%) ratio 66 and no better p saba, ERV 27 and DLCO 56 corrects to 77 - 11/30/2014   try dulera 100 2bid > 01/03/2015 not clear helping,  use prn > flare since mid 02/2015 cough > sob  - 04/07/2015 p extensive coaching HFA effectiveness =    90% > try dulera 100 2bid > improved  Much better so no need to change rx > Each maintenance medication was reviewed in detail including most importantly the difference between maintenance and as needed and under what circumstances the prns are to be used.

## 2015-04-22 NOTE — Assessment & Plan Note (Signed)
-   allergy profile 03/10/14  >  IgE 18 with neg RAST - sinus ct 10/25/2014 > Clear sinuses. - try off cozar 11/03/2014 >>> - neurontin 100 tid rx 11/30/14 > improved 01/03/2015  - sinus CT 02/17/15 > neg   Her cough is clearly multifactorial but finally improved and should be able to change back to hs zyrtec and stop 1st gen h1 at this point  See instructions for specific recommendations which were reviewed directly with the patient who was given a copy with highlighter outlining the key components.

## 2015-04-27 DIAGNOSIS — M65312 Trigger thumb, left thumb: Secondary | ICD-10-CM | POA: Diagnosis not present

## 2015-04-27 DIAGNOSIS — M5416 Radiculopathy, lumbar region: Secondary | ICD-10-CM | POA: Diagnosis not present

## 2015-04-27 DIAGNOSIS — M79604 Pain in right leg: Secondary | ICD-10-CM | POA: Diagnosis not present

## 2015-05-03 ENCOUNTER — Other Ambulatory Visit: Payer: Self-pay | Admitting: Internal Medicine

## 2015-05-03 NOTE — Telephone Encounter (Signed)
Please advise on refill request for Tramadol. Last OV 04/21/15 Last refill 04/07/15.

## 2015-05-25 ENCOUNTER — Other Ambulatory Visit: Payer: Self-pay | Admitting: Internal Medicine

## 2015-06-01 ENCOUNTER — Ambulatory Visit (INDEPENDENT_AMBULATORY_CARE_PROVIDER_SITE_OTHER): Payer: Medicare Other | Admitting: Internal Medicine

## 2015-06-01 ENCOUNTER — Encounter: Payer: Self-pay | Admitting: Internal Medicine

## 2015-06-01 VITALS — BP 110/70 | HR 81 | Ht 65.0 in | Wt 209.8 lb

## 2015-06-01 DIAGNOSIS — R059 Cough, unspecified: Secondary | ICD-10-CM

## 2015-06-01 DIAGNOSIS — J449 Chronic obstructive pulmonary disease, unspecified: Secondary | ICD-10-CM

## 2015-06-01 DIAGNOSIS — I10 Essential (primary) hypertension: Secondary | ICD-10-CM

## 2015-06-01 DIAGNOSIS — R05 Cough: Secondary | ICD-10-CM

## 2015-06-01 MED ORDER — MOMETASONE FURO-FORMOTEROL FUM 200-5 MCG/ACT IN AERO: INHALATION_SPRAY | RESPIRATORY_TRACT | Status: DC

## 2015-06-01 NOTE — Patient Instructions (Signed)
Change dulera to 200 Take 2 puffs first thing in am and then another 2 puffs about 12 hours later.   Work on Doctor, hospital technique:  relax and gently blow all the way out then take a nice smooth deep breath back in, triggering the inhaler at same time you start breathing in.  Hold for up to 5 seconds if you can.  Rinse and gargle with water when done.    If happy with breathing/ coughing then fill the prescription and if not return here.

## 2015-06-01 NOTE — Progress Notes (Signed)
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 Pulmonary Consult    Former pt of Dr. Annamaria Boots. Pt c/o increased cough x 3 wks. Cough is prod with moderate, thick, light yellow sputum.  She coughs until loses urinary continence.  Not sleeping well due to cough.   remembers having tendency to throat tickle in high sschool  but completely resolved by graduation  then recurred in her 60s and resolved with codeine then recurred in 2011  but bad to worse x 3 weeks assoc with sensation of choking, mucus is thick light yellow.  acei stopped about a month prior to OV  Replaced by losartan  Inhalers make it worse, esp dpi    Kouffman Reflux v Neurogenic Cough Differentiator Reflux Comments  Do you awaken from a sound sleep coughing violently?                            With trouble breathing? Yes   Do you have choking episodes when you cannot  Get enough air, gasping for air ?              Yes   Do you usually cough when you lie down into  The bed, or when you just lie down to rest ?                          Yes   Do you usually cough after meals or eating?         maybe   Do you cough when (or after) you bend over?    No    GERD SCORE     Kouffman Reflux v Neurogenic Cough Differentiator Neurogenic   Do you more-or-less cough all day long? yes   Does change of temperature make you cough? no   Does laughing or chuckling cause you to cough? no   Do fumes (perfume, automobile fumes,  burned  Toast, etc.,) cause you to cough ?      Not aware   Does speaking, singing, or talking on the phone cause you to cough   ?               sometimes   Neurogenic/Airway score       rec  schedule sinus CT > neg  First take delsym two tsp every 12 hours and supplement if needed with  tramadol 50 mg up to 2 every 4 hours to suppress the urge to cough at all or even clear your throat.  Prednisone 10 mg take  4 each am x 2 days,   2 each am x 2 days,  1 each am x 2 days and stop (this is to eliminate allergies and inflammation from coughing) Protonix (pantoprazole) Take 30-60 min before first meal of the day and Pepcid 20 mg one bedtime plus chlorpheniramine  4 mg x 2 at bedtime (both available over the counter)    11/03/2014 f/u ov/[name] re: cough x 5 weeks/ Mild COPD  Chief Complaint  Patient presents with  . Follow-up    Pt states that cough is some better.  Still coughing up minimal white, foamy sputum.  No new co's today.    >off ACE and rx bystolic   16/96/78 Follow up for Cough /Mild COPD  Pt returns for 2 week follow up and med review .  We reviewed all her medications organize them into a medication count with patient education Appears that she is taking her medications correctly Last visit. She was stopped off of her ACE inhibitor due to cough She was given Bystolic for blood pressure control Blood pressure is well-controlled today. She is tolerating medication well She returns without any significant change in her shortness of breath or cough. Chest x-ray showed no acute process, CT sinus showed no acute sinusitis. rec Add Allergy Relief (Chlortrimeton 4mg  ) 2 At bedtime   May use 1 extra allergy relief every 4hr as needed for throat clearing .  Continue with cough control with delsym and tramadol.  Continue on Bystolic 5mg  daily   11/30/2014 f/u ov/[name] re: chronic cough  Chief Complaint  Patient presents with  . Acute Visit    Pt states that her cough is no better  since last visit here 11/18/14. She also c/o frequent throat clearing and "rattling in throat". She states that there have been 3 occasions since last visit where she woke up from sleeping with cough and "felt like choking".   Overall pattern has not improved on rx unless includes tramadol  though not clear that she's been able to take the complex regimen  We've recommended and has not yet started the prednisone that was recommended over the phone  rec Start dulera 100 Take 2 puffs first thing in am and then another 2 puffs about 12 hours later.  Work on inhaler technique:  Get the prednisone right away For cough > mucinex dm up to 1200 mg twice daily and supplement with tramadol 50 mg up to 2 every 4 hours if needed  Change neurontin to 100 three times daily     01/03/2015 f/u ov/[name] re: cough since 2011/ GOLD II COPD / tickle since childhood  Chief Complaint  Patient presents with  . Follow-up    cough has resolved; no concerns today   prednisone may have helped  This time / used it with tramadol but no more tramadol x weeks and no flare  May have caught cold xsev days but so far just controlling cough with candy/ ? Better on dulera > note sure, may have caused choking  Stopped acid suppression/  dulera but still on neurontin 100 tid. rec Stay on neurontin (gabapentin) three times a day indefinitely  At first sign of flare > Pantoprazole (protonix) 40 mg   Take 30-60 min before first meal of the day and Pepcid 20 mg one bedtime until gone and return if needed If all else fails and you are having trouble breathing try dulera 100 up to 2 every 12 hours  F/u is prn   04/07/2015 f/u ov/[name] re: recurrent cough/ wheeze  Chief Complaint  Patient presents with  . Acute Visit    Pt states cough started back approx 1 month ago- occ prod with minimal, foamy, white sputum.  She also c/o SOB and chest tightness when she has a coughing fit.  onset of flare was gradual/ pattern is intermittent  daytime but wakes up in am now wheezing / coughing but not usually prematurely    Never stopped the gerd rx and maintained on singulair / did not try the dulera when symptoms flared rec Start dulera 100 sample Take 2 puffs first thing in am and then another 2 puffs about 12 hours later.  Take delsym two tsp every 12 hours and supplement if needed with  tramadol 50 mg up to 2 every 4 hours to suppress the urge to cough  If not improving > Prednisone 10 mg take  4 each am x 2 days,   2 each am x 2 days,  1 each am x 2 days and stop  Please schedule a follow up office visit in 2 weeks, sooner if needed  Add consider d/c singulair p allergy season      04/21/2015 f/u ov/Landyn Buckalew re: chronic cough/ likely multifactorial better on dulera 100 2bid Chief Complaint  Patient presents with  . Follow-up    Pt states that chest tightness and SOB have resolved. Her cough is much better. No new co's today.    No longer needing any tramadol/ never took the prednisone  Main issue is excess drowsiness on 1st gen h1  rec Stop chloretrimeton and take zyrtec 10 mg at bedtime as needed for itching/ sneezing/runny nose, post nasal drainage - good x 24 hours  Continue dulera 100 Take 2 puffs first thing in am and then another 2 puffs about 12 hours later.     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 Follow-up    Pt states her breathing is doing well. No new co's today.    Now on lyrica instead of gabapentin/ minimal cough mostly daytime, esp early in am/ Not limited by breathing from desired activities  / no need for saba or cough suppression      No obvious day to day or daytime variabilty or assoc  excess or purulent sputum or cp or chest tightness, subjective wheeze overt sinus or hb symptoms. No unusual exp hx or h/o childhood pna/ asthma or knowledge of premature birth.   Also denies any obvious fluctuation of  symptoms with weather or environmental changes or other aggravating or alleviating factors except as outlined above   Current Medications, Allergies, Complete Past Medical History, Past Surgical History, Family History, and Social History were reviewed in [employer] record.  ROS  The following are not active complaints unless bolded sore throat, dysphagia, dental problems, itching, sneezing,  nasal congestion or excess/ purulent secretions, ear ache,   fever, chills, sweats, unintended wt loss, pleuritic or exertional cp, hemoptysis,  orthopnea pnd or leg swelling, presyncope, palpitations, heartburn, abdominal pain, anorexia, nausea, vomiting, diarrhea  or change in bowel or urinary habits, change in stools or urine, dysuria,hematuria,  rash, arthralgias, visual complaints, headache, numbness weakness or ataxia or problems with walking or coordination,  change in mood/affect or memory.                  Objective:   Physical Exam  amb bf nad    04/07/2015           207  > 04/21/2015 208 > 06/01/2015  210  Wt Readings from Last 3 Encounters:  11/30/14 209 lb 6.4 oz (94.983 kg)  11/02/14 205 lb 8 oz (93.214 kg)  10/19/14 206 lb (93.441 kg)    Vital signs reviewed      HEENT: nl dentition, turbinates, and orophanx. Nl external ear canals without cough reflex   NECK :  without JVD/Nodes/TM/ nl carotid upstrokes bilaterally   LUNGS: no acc muscle use, clear to A and P bilaterally without cough on insp or exp maneuvers   CV:  RRR  no s3 or murmur or increase in P2, no edema   ABD:  soft and nontender with nl excursion in the supine position. No bruits or organomegaly, bowel sounds nl  MS:  warm without deformities, calf tenderness, cyanosis or clubbing  SKIN: warm and dry without lesions    NEURO:  alert, approp, no deficits       CXR  10/19/2014 : 1. No active cardiopulmonary abnormalities. 2. Hyperinflation.          Assessment & Plan:

## 2015-06-03 ENCOUNTER — Other Ambulatory Visit: Payer: Self-pay | Admitting: Internal Medicine

## 2015-06-03 ENCOUNTER — Encounter: Payer: Self-pay | Admitting: Internal Medicine

## 2015-06-03 NOTE — Assessment & Plan Note (Addendum)
Trial off acei due to cough >   on bystolic 43/07/3778 >  Changed to bisoprolol due to insurance   Adequate control on present rx, reviewed > no change in rx needed

## 2015-06-03 NOTE — Assessment & Plan Note (Signed)
Quit smoking 2007 PFT: 03/17/2014-  FEV1  1.28 (58%) ratio 66 and no better p saba, ERV 27 and DLCO 56 corrects to 77 - 11/30/2014   try dulera 100 2bid > 01/03/2015 not clear helping,  use prn > flare since mid 02/2015 cough > sob  - 04/07/2015   try dulera 100 2bid > improved 04/21/15     The proper method of use, as well as anticipated side effects, of a metered-dose inhaler are discussed and demonstrated to the patient. Improved effectiveness after extensive coaching during this visit to a level of approximately      90% > try dulera 200 2bid to see if symptoms improve in which case this is a lower airway problem or worsen in which case the upper airway component is dominant (flares typically with high dose ics)  Each maintenance medication was reviewed in detail including most importantly the difference between maintenance and as needed and under what circumstances the prns are to be used.  Please see instructions for details which were reviewed in writing and the patient given a copy.

## 2015-06-03 NOTE — Assessment & Plan Note (Signed)
-   allergy profile 03/10/14  >  IgE 18 with neg RAST - sinus ct 10/25/2014 > Clear sinuses. - try off cozar 11/03/2014 >>> - neurontin 100 tid rx 11/30/14 > improved 01/03/2015 > changed to lyrica 04/2015  - sinus CT 02/17/15 > neg   No longer needing cough suppression - good sign

## 2015-06-05 ENCOUNTER — Other Ambulatory Visit: Payer: Self-pay | Admitting: Internal Medicine

## 2015-06-26 ENCOUNTER — Other Ambulatory Visit: Payer: Self-pay

## 2015-07-18 ENCOUNTER — Encounter: Payer: Self-pay | Admitting: Internal Medicine

## 2015-07-18 NOTE — Telephone Encounter (Signed)
Please advise MW thanks 

## 2015-07-19 MED ORDER — TRAMADOL HCL 50 MG PO TABS: ORAL_TABLET | ORAL | Status: DC

## 2015-08-04 ENCOUNTER — Other Ambulatory Visit: Payer: Self-pay | Admitting: Internal Medicine

## 2015-08-07 ENCOUNTER — Other Ambulatory Visit: Payer: Self-pay | Admitting: Internal Medicine

## 2015-08-07 ENCOUNTER — Encounter: Payer: Self-pay | Admitting: Internal Medicine

## 2015-08-16 DIAGNOSIS — I129 Hypertensive chronic kidney disease with stage 1 through stage 4 chronic kidney disease, or unspecified chronic kidney disease: Secondary | ICD-10-CM | POA: Diagnosis not present

## 2015-08-16 DIAGNOSIS — N182 Chronic kidney disease, stage 2 (mild): Secondary | ICD-10-CM | POA: Diagnosis not present

## 2015-08-16 DIAGNOSIS — E1122 Type 2 diabetes mellitus with diabetic chronic kidney disease: Secondary | ICD-10-CM | POA: Diagnosis not present

## 2015-08-16 DIAGNOSIS — N08 Glomerular disorders in diseases classified elsewhere: Secondary | ICD-10-CM | POA: Diagnosis not present

## 2015-09-08 ENCOUNTER — Encounter: Payer: Self-pay | Admitting: Adult Health

## 2015-09-08 ENCOUNTER — Ambulatory Visit (INDEPENDENT_AMBULATORY_CARE_PROVIDER_SITE_OTHER): Payer: Medicare Other | Admitting: Adult Health

## 2015-09-08 VITALS — BP 128/72 | HR 94 | Temp 98.8°F | Ht 65.0 in | Wt 211.0 lb

## 2015-09-08 DIAGNOSIS — J449 Chronic obstructive pulmonary disease, unspecified: Secondary | ICD-10-CM

## 2015-09-08 DIAGNOSIS — R1012 Left upper quadrant pain: Secondary | ICD-10-CM | POA: Diagnosis not present

## 2015-09-08 DIAGNOSIS — J310 Chronic rhinitis: Secondary | ICD-10-CM

## 2015-09-08 MED ORDER — PREDNISONE 10 MG PO TABS: ORAL_TABLET | ORAL | Status: DC

## 2015-09-08 MED ORDER — AZITHROMYCIN 250 MG PO TABS: ORAL_TABLET | ORAL | Status: AC

## 2015-09-08 NOTE — Patient Instructions (Signed)
Zpack to have on hold if symptoms worsen with discolored mucus .  Mucinex DM Twice daily  As needed   Prednisone taper over next week.  Please contact office for sooner follow up if symptoms do not improve or worsen or seek emergency care   follow up Dr. [name]  In 6 weeks and As needed

## 2015-09-08 NOTE — Assessment & Plan Note (Signed)
Exacerbation   Plan  Zpack to have on hold if symptoms worsen with discolored mucus .  Mucinex DM Twice daily  As needed   Prednisone taper over next week.  Please contact office for sooner follow up if symptoms do not improve or worsen or seek emergency care   follow up Dr. [name]  In 6 weeks and As needed

## 2015-09-08 NOTE — Progress Notes (Signed)
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 Pulmonary Consult    Former pt of Dr. Annamaria Boots. Pt c/o increased cough x 3 wks. Cough is prod with moderate, thick, light yellow sputum.  She coughs until loses urinary continence.  Not sleeping well due to cough.   remembers having tendency to throat tickle in high sschool  but completely resolved by graduation  then recurred in her 60s and resolved with codeine then recurred in 2011  but bad to worse x 3 weeks assoc with sensation of choking, mucus is thick light yellow.  acei stopped about a month prior to OV  Replaced by losartan  Inhalers make it worse, esp dpi    Kouffman Reflux v Neurogenic Cough Differentiator Reflux Comments  Do you awaken from a sound sleep coughing violently?                            With trouble breathing? Yes   Do you have choking episodes when you cannot  Get enough air, gasping for air ?              Yes   Do you usually cough when you lie down into  The bed, or when you just lie down to rest ?                          Yes   Do you usually cough after meals or eating?         maybe   Do you cough when (or after) you bend over?    No    GERD SCORE     Kouffman Reflux v Neurogenic Cough Differentiator Neurogenic   Do you more-or-less cough all day long? yes   Does change of temperature make you cough? no   Does laughing or chuckling cause you to cough? no   Do fumes (perfume, automobile fumes,  burned  Toast, etc.,) cause you to cough ?      Not aware   Does speaking, singing, or talking on the phone cause you to cough   ?               sometimes   Neurogenic/Airway score       rec  schedule sinus CT > neg  First take delsym two tsp every 12 hours and supplement if needed with  tramadol 50 mg up to 2 every 4 hours to suppress the urge to cough at all or even clear your throat.  Prednisone 10 mg take  4 each am x 2 days,   2 each am x 2 days,  1 each am x 2 days and stop (this is to eliminate allergies and inflammation from coughing) Protonix (pantoprazole) Take 30-60 min before first meal of the day and Pepcid 20 mg one bedtime plus chlorpheniramine  4 mg x 2 at bedtime (both available over the counter)    11/03/2014 f/u ov/Wert re: cough x 5 weeks/ Mild COPD  Chief Complaint  Patient presents with  . Follow-up    Pt states that cough is some better.  Still coughing up minimal white, foamy sputum.  No new co's today.    >off ACE and rx bystolic   16/96/78 Follow up for Cough /Mild COPD  Pt returns for 2 week follow up and med review .  We reviewed all her medications organize them into a medication count with patient education Appears that she is taking her medications correctly Last visit. She was stopped off of her ACE inhibitor due to cough She was given Bystolic for blood pressure control Blood pressure is well-controlled today. She is tolerating medication well She returns without any significant change in her shortness of breath or cough. Chest x-ray showed no acute process, CT sinus showed no acute sinusitis. rec Add Allergy Relief (Chlortrimeton 4mg  ) 2 At bedtime   May use 1 extra allergy relief every 4hr as needed for throat clearing .  Continue with cough control with delsym and tramadol.  Continue on Bystolic 5mg  daily   11/30/2014 f/u ov/Wert re: chronic cough  Chief Complaint  Patient presents with  . Acute Visit    Pt states that her cough is no better  since last visit here 11/18/14. She also c/o frequent throat clearing and "rattling in throat". She states that there have been 3 occasions since last visit where she woke up from sleeping with cough and "felt like choking".   Overall pattern has not improved on rx unless includes tramadol  though not clear that she's been able to take the complex regimen  We've recommended and has not yet started the prednisone that was recommended over the phone  rec Start dulera 100 Take 2 puffs first thing in am and then another 2 puffs about 12 hours later.  Work on inhaler technique:  Get the prednisone right away For cough > mucinex dm up to 1200 mg twice daily and supplement with tramadol 50 mg up to 2 every 4 hours if needed  Change neurontin to 100 three times daily     01/03/2015 f/u ov/Wert re: cough since 2011/ GOLD II COPD / tickle since childhood  Chief Complaint  Patient presents with  . Follow-up    cough has resolved; no concerns today   prednisone may have helped  This time / used it with tramadol but no more tramadol x weeks and no flare  May have caught cold xsev days but so far just controlling cough with candy/ ? Better on dulera > note sure, may have caused choking  Stopped acid suppression/  dulera but still on neurontin 100 tid. rec Stay on neurontin (gabapentin) three times a day indefinitely  At first sign of flare > Pantoprazole (protonix) 40 mg   Take 30-60 min before first meal of the day and Pepcid 20 mg one bedtime until gone and return if needed If all else fails and you are having trouble breathing try dulera 100 up to 2 every 12 hours  F/u is prn   04/07/2015 f/u ov/Wert re: recurrent cough/ wheeze  Chief Complaint  Patient presents with  . Acute Visit    Pt states cough started back approx 1 month ago- occ prod with minimal, foamy, white sputum.  She also c/o SOB and chest tightness when she has a coughing fit.  onset of flare was gradual/ pattern is intermittent  daytime but wakes up in am now wheezing / coughing but not usually prematurely    Never stopped the gerd rx and maintained on singulair / did not try the dulera when symptoms flared rec Start dulera 100 sample Take 2 puffs first thing in am and then another 2 puffs about 12 hours later.  Take delsym two tsp every 12 hours and supplement if needed with  tramadol 50 mg up to 2 every 4 hours to suppress the urge to cough  If not improving > Prednisone 10 mg take  4 each am x 2 days,   2 each am x 2 days,  1 each am x 2 days and stop  Please schedule a follow up office visit in 2 weeks, sooner if needed  Add consider d/c singulair p allergy season      04/21/2015 f/u ov/Wert re: chronic cough/ likely multifactorial better on dulera 100 2bid Chief Complaint  Patient presents with  . Follow-up    Pt states that chest tightness and SOB have resolved. Her cough is much better. No new co's today.    No longer needing any tramadol/ never took the prednisone  Main issue is excess drowsiness on 1st gen h1  rec Stop chloretrimeton and take zyrtec 10 mg at bedtime as needed for itching/ sneezing/runny nose, post nasal drainage - good x 24 hours  Continue dulera 100 Take 2 puffs first thing in am and then another 2 puffs about 12 hours later.     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 Follow-up    Pt states her breathing is doing well. No new co's today.    Now on lyrica instead of gabapentin/ minimal cough mostly daytime, esp early in am/ Not limited by breathing from desired activities  / no need for saba or cough suppression  >>cont on dulera   09/08/2015 Follow up : COPD -GOLD II /Asthma Bronchitis  Pt presents for an acute office visit.  Complains over the last week she has had increased cough , congestion and wheezing .  Grandkids have been sick .  No otc used.  Remains on Dulera .  Denies  chest pain, orthopnea, edema or hemoptysis .        Current Medications, Allergies, Complete Past Medical History, Past Surgical History, Family History, and Social History were reviewed in [facility] record.  ROS  The following are not active complaints unless bolded sore throat, dysphagia, dental problems, itching, sneezing,  nasal congestion or excess/ purulent secretions, ear ache,   fever, chills, sweats, unintended wt loss, pleuritic or exertional cp, hemoptysis,  orthopnea pnd or leg swelling, presyncope, palpitations, heartburn, abdominal pain, anorexia, nausea, vomiting, diarrhea  or change in bowel or urinary habits, change in stools or urine, dysuria,hematuria,  rash, arthralgias, visual complaints, headache, numbness weakness or ataxia or problems with walking or coordination,  change in mood/affect or memory.                  Objective:   Physical Exam  amb bf nad    04/07/2015           207  > 04/21/2015 208 > 06/01/2015  210>211 09/08/2015    Vital signs reviewed , barking cough     HEENT: nl dentition, turbinates, and orophanx. Nl external ear canals without cough  reflex   NECK :  without JVD/Nodes/TM/ nl carotid upstrokes bilaterally   LUNGS: no acc muscle use, few faint exp wheezing    CV:  RRR  no s3 or murmur or increase in P2, no edema   ABD:  soft and nontender with nl excursion in the supine position. No bruits or organomegaly, bowel sounds nl  MS:  warm without deformities, calf tenderness, cyanosis or clubbing  SKIN: warm and dry without lesions    NEURO:  alert, approp, no deficits       CXR  10/19/2014 : 1. No active cardiopulmonary abnormalities. 2. Hyperinflation.          Assessment & Plan:

## 2015-09-08 NOTE — Assessment & Plan Note (Signed)
Mild flare , may use mucinex and saline As needed

## 2015-09-09 NOTE — Progress Notes (Signed)
Chart and office note reviewed in detail  > agree with a/p as outlined    

## 2015-09-19 DIAGNOSIS — H40013 Open angle with borderline findings, low risk, bilateral: Secondary | ICD-10-CM | POA: Diagnosis not present

## 2015-09-19 DIAGNOSIS — E119 Type 2 diabetes mellitus without complications: Secondary | ICD-10-CM | POA: Diagnosis not present

## 2015-09-19 DIAGNOSIS — H2513 Age-related nuclear cataract, bilateral: Secondary | ICD-10-CM | POA: Diagnosis not present

## 2015-09-19 DIAGNOSIS — H10413 Chronic giant papillary conjunctivitis, bilateral: Secondary | ICD-10-CM | POA: Diagnosis not present

## 2015-10-10 ENCOUNTER — Encounter: Payer: Self-pay | Admitting: Internal Medicine

## 2015-10-10 MED ORDER — TRAMADOL HCL 50 MG PO TABS: ORAL_TABLET | ORAL | Status: DC

## 2015-10-10 NOTE — Telephone Encounter (Signed)
10.11.16 mychart message from pt: Message     Ok to refill but be sure has f/u ov before this refill runs out    ----- Message -----     From: [name]     Sent: 10/10/2015  7:52 AM      To: Tanda Rockers, MD    Subject: Non-Urgent Medical Question                 ----- Message from [name], [place] sent at 10/10/2015 7:52 AM EDT -----            ----- Message from [facility] to Tanda Rockers, MD sent at 10/10/2015 12:55 AM -----     I am requesting a refill of Tramadol. Cough with tickle is back. My pharmacy is [facility] on [address]. Thank you. I can be reached at [phone].    Patient is actually already scheduled to see MW on 10.19.16 Rx telephoned to verified pharmacy Email sent to patient informing her of the above and to keep her upcoming appt Nothing further needed; will sign off

## 2015-10-10 NOTE — Telephone Encounter (Signed)
MW please advise on refill. Thanks. 

## 2015-10-18 ENCOUNTER — Ambulatory Visit: Payer: Medicare Other | Admitting: Internal Medicine

## 2015-10-18 ENCOUNTER — Encounter: Payer: Self-pay | Admitting: Internal Medicine

## 2015-12-06 DIAGNOSIS — Z1231 Encounter for screening mammogram for malignant neoplasm of breast: Secondary | ICD-10-CM | POA: Diagnosis not present

## 2015-12-14 DIAGNOSIS — I129 Hypertensive chronic kidney disease with stage 1 through stage 4 chronic kidney disease, or unspecified chronic kidney disease: Secondary | ICD-10-CM | POA: Diagnosis not present

## 2015-12-14 DIAGNOSIS — N08 Glomerular disorders in diseases classified elsewhere: Secondary | ICD-10-CM | POA: Diagnosis not present

## 2015-12-14 DIAGNOSIS — E1122 Type 2 diabetes mellitus with diabetic chronic kidney disease: Secondary | ICD-10-CM | POA: Diagnosis not present

## 2015-12-14 DIAGNOSIS — Z23 Encounter for immunization: Secondary | ICD-10-CM | POA: Diagnosis not present

## 2015-12-14 DIAGNOSIS — N182 Chronic kidney disease, stage 2 (mild): Secondary | ICD-10-CM | POA: Diagnosis not present

## 2015-12-27 ENCOUNTER — Encounter: Payer: Self-pay | Admitting: Internal Medicine

## 2015-12-27 MED ORDER — TRAMADOL HCL 50 MG PO TABS: ORAL_TABLET | ORAL | Status: DC

## 2015-12-27 NOTE — Telephone Encounter (Signed)
Rx has been called in per MW.

## 2015-12-27 NOTE — Telephone Encounter (Signed)
MW please advise on refill. She no showed for her last OV with you back in October. There are no pending OV's at this time. Thanks.

## 2016-01-08 ENCOUNTER — Other Ambulatory Visit: Payer: Self-pay | Admitting: Internal Medicine

## 2016-01-30 ENCOUNTER — Encounter: Payer: Self-pay | Admitting: Internal Medicine

## 2016-01-30 ENCOUNTER — Ambulatory Visit (INDEPENDENT_AMBULATORY_CARE_PROVIDER_SITE_OTHER)
Admission: RE | Admit: 2016-01-30 | Discharge: 2016-01-30 | Disposition: A | Discharge status: Home / Self Care | Payer: Medicare Other | Source: Ambulatory Visit | Attending: Internal Medicine | Admitting: Internal Medicine

## 2016-01-30 ENCOUNTER — Telehealth: Payer: Self-pay | Admitting: Internal Medicine

## 2016-01-30 ENCOUNTER — Ambulatory Visit (INDEPENDENT_AMBULATORY_CARE_PROVIDER_SITE_OTHER): Payer: Medicare Other | Admitting: Internal Medicine

## 2016-01-30 VITALS — BP 106/70 | HR 100 | Temp 100.6°F | Ht 66.0 in | Wt 184.0 lb

## 2016-01-30 DIAGNOSIS — R05 Cough: Secondary | ICD-10-CM | POA: Diagnosis not present

## 2016-01-30 DIAGNOSIS — R509 Fever, unspecified: Secondary | ICD-10-CM | POA: Diagnosis not present

## 2016-01-30 DIAGNOSIS — R0602 Shortness of breath: Secondary | ICD-10-CM | POA: Diagnosis not present

## 2016-01-30 DIAGNOSIS — R059 Cough, unspecified: Secondary | ICD-10-CM

## 2016-01-30 DIAGNOSIS — J449 Chronic obstructive pulmonary disease, unspecified: Secondary | ICD-10-CM

## 2016-01-30 MED ORDER — AZITHROMYCIN 250 MG PO TABS: ORAL_TABLET | ORAL | Status: DC

## 2016-01-30 MED ORDER — TRAMADOL HCL 50 MG PO TABS: ORAL_TABLET | ORAL | Status: DC

## 2016-01-30 MED ORDER — PREDNISONE 10 MG PO TABS: ORAL_TABLET | ORAL | Status: DC

## 2016-01-30 MED ORDER — CLOTRIMAZOLE 10 MG MT TROC: 10.0000 mg | Freq: Every day | OROMUCOSAL | Status: DC

## 2016-01-30 NOTE — Progress Notes (Signed)
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 Pulmonary Consult    Former pt of Dr. Annamaria Boots. Pt c/o increased cough x 3 wks. Cough is prod with moderate, thick, light yellow sputum.  She coughs until loses urinary continence.  Not sleeping well due to cough.   remembers having tendency to throat tickle in high sschool  but completely resolved by graduation  then recurred in her 60s and resolved with codeine then recurred in 2011  but bad to worse x 3 weeks assoc with sensation of choking, mucus is thick light yellow.  acei stopped about a month prior to OV  Replaced by losartan  Inhalers make it worse, esp dpi    Kouffman Reflux v Neurogenic Cough Differentiator Reflux Comments  Do you awaken from a sound sleep coughing violently?                            With trouble breathing? Yes   Do you have choking episodes when you cannot  Get enough air, gasping for air ?              Yes   Do you usually cough when you lie down into  The bed, or when you just lie down to rest ?                          Yes   Do you usually cough after meals or eating?         maybe   Do you cough when (or after) you bend over?    No    GERD SCORE     Kouffman Reflux v Neurogenic Cough Differentiator Neurogenic   Do you more-or-less cough all day long? yes   Does change of temperature make you cough? no   Does laughing or chuckling cause you to cough? no   Do fumes (perfume, automobile fumes,  burned  Toast, etc.,) cause you to cough ?      Not aware   Does speaking, singing, or talking on the phone cause you to cough   ?               sometimes   Neurogenic/Airway score       rec  schedule sinus CT > neg  First take delsym two tsp every 12 hours and supplement if needed with  tramadol 50 mg up to 2 every 4 hours to suppress the urge to cough at all or even clear your throat.  Prednisone 10 mg take  4 each am x 2 days,   2 each am x 2 days,  1 each am x 2 days and stop (this is to eliminate allergies and inflammation from coughing) Protonix (pantoprazole) Take 30-60 min before first meal of the day and Pepcid 20 mg one bedtime plus chlorpheniramine  4 mg x 2 at bedtime (both available over the counter)    11/03/2014 f/u ov/[name] re: cough x 5 weeks/ Mild COPD  Chief Complaint  Patient presents with  . Follow-up    Pt states that cough is some better.  Still coughing up minimal white, foamy sputum.  No new co's today.    >off ACE and rx bystolic   16/96/78 Follow up for Cough /Mild COPD  Pt returns for 2 week follow up and med review .  We reviewed all her medications organize them into a medication count with patient education Appears that she is taking her medications correctly Last visit. She was stopped off of her ACE inhibitor due to cough She was given Bystolic for blood pressure control Blood pressure is well-controlled today. She is tolerating medication well She returns without any significant change in her shortness of breath or cough. Chest x-ray showed no acute process, CT sinus showed no acute sinusitis. rec Add Allergy Relief (Chlortrimeton 4mg  ) 2 At bedtime   May use 1 extra allergy relief every 4hr as needed for throat clearing .  Continue with cough control with delsym and tramadol.  Continue on Bystolic 5mg  daily   11/30/2014 f/u ov/[name] re: chronic cough  Chief Complaint  Patient presents with  . Acute Visit    Pt states that her cough is no better  since last visit here 11/18/14. She also c/o frequent throat clearing and "rattling in throat". She states that there have been 3 occasions since last visit where she woke up from sleeping with cough and "felt like choking".   Overall pattern has not improved on rx unless includes tramadol  though not clear that she's been able to take the complex regimen  We've recommended and has not yet started the prednisone that was recommended over the phone  rec Start dulera 100 Take 2 puffs first thing in am and then another 2 puffs about 12 hours later.  Work on inhaler technique:  Get the prednisone right away For cough > mucinex dm up to 1200 mg twice daily and supplement with tramadol 50 mg up to 2 every 4 hours if needed  Change neurontin to 100 three times daily     01/03/2015 f/u ov/[name] re: cough since 2011/ GOLD II COPD / tickle since childhood  Chief Complaint  Patient presents with  . Follow-up    cough has resolved; no concerns today   prednisone may have helped  This time / used it with tramadol but no more tramadol x weeks and no flare  May have caught cold xsev days but so far just controlling cough with candy/ ? Better on dulera > note sure, may have caused choking  Stopped acid suppression/  dulera but still on neurontin 100 tid. rec Stay on neurontin (gabapentin) three times a day indefinitely  At first sign of flare > Pantoprazole (protonix) 40 mg   Take 30-60 min before first meal of the day and Pepcid 20 mg one bedtime until gone and return if needed If all else fails and you are having trouble breathing try dulera 100 up to 2 every 12 hours  F/u is prn   04/07/2015 f/u ov/[name] re: recurrent cough/ wheeze  Chief Complaint  Patient presents with  . Acute Visit    Pt states cough started back approx 1 month ago- occ prod with minimal, foamy, white sputum.  She also c/o SOB and chest tightness when she has a coughing fit.  onset of flare was gradual/ pattern is intermittent  daytime but wakes up in am now wheezing / coughing but not usually prematurely    Never stopped the gerd rx and maintained on singulair / did not try the dulera when symptoms flared rec Start dulera 100 sample Take 2 puffs first thing in am and then another 2 puffs about 12 hours later.  Take delsym two tsp every 12 hours and supplement if needed with  tramadol 50 mg up to 2 every 4 hours to suppress the urge to cough  If not improving > Prednisone 10 mg take  4 each am x 2 days,   2 each am x 2 days,  1 each am x 2 days and stop  Please schedule a follow up office visit in 2 weeks, sooner if needed  Add consider d/c singulair p allergy season      04/21/2015 f/u ov/Frans Valente re: chronic cough/ likely multifactorial better on dulera 100 2bid Chief Complaint  Patient presents with  . Follow-up    Pt states that chest tightness and SOB have resolved. Her cough is much better. No new co's today.    No longer needing any tramadol/ never took the prednisone  Main issue is excess drowsiness on 1st gen h1  rec Stop chloretrimeton and take zyrtec 10 mg at bedtime as needed for itching/ sneezing/runny nose, post nasal drainage - good x 24 hours  Continue dulera 100 Take 2 puffs first thing in am and then another 2 puffs about 12 hours later.     06/01/2015 f/u ov/Shariece Viveiros re: GOLD II copd/ asthmatic bronchitis on dulera 100 2bid and singulair  Chief Complaint  Patient presents with  . Follow-up    Pt states her breathing is doing well. No new co's today.    Now on lyrica instead of gabapentin/ minimal cough mostly daytime, esp early in am/ Not limited by breathing from desired activities  / no need for saba or cough suppression  >>cont on dulera   09/08/2015 Follow up : COPD -GOLD II /Asthma Bronchitis  Pt presents for an acute office visit.  Complains over the last week she has had increased cough , congestion and wheezing .  Grandkids have been sick .  No otc used.  Remains on Dulera .  rec Zpack  to have on hold if symptoms worsen with discolored mucus .  Mucinex DM Twice daily  As needed   Prednisone taper over next week.     01/30/2016   Acute ext ov/Lenor Provencher re: recurrent cough/ sob Chief Complaint  Patient presents with  . Acute Visit    Pt c/o cough, fever and SOB x 3 days. Cough is prod with white sputum. She feels achy all over.    had been much better until acute illness 3 d prior to OV  While maint on dulera 200 and no need for saba/tramadol Comfortable at rest except for cough / fever but no rigors   No obvious day to day or daytime variability or assoc cp or chest tightness, subjective wheeze or overt sinus or hb symptoms. No unusual exp hx or h/o childhood pna/ asthma or knowledge of premature birth.  Sleeping ok without nocturnal  or early am exacerbation  of respiratory  c/o's or need for noct saba. Also denies any obvious fluctuation of symptoms with weather or environmental changes or other aggravating or alleviating factors except as outlined above   Current Medications, Allergies, Complete Past Medical History, Past Surgical History,  Family History, and Social History were reviewed in [employer] record.  ROS  The following are not active complaints unless bolded sore throat, dysphagia, dental problems, itching, sneezing,  nasal congestion or excess/ purulent secretions, ear ache,   fever, chills, sweats, unintended wt loss, classically pleuritic or exertional cp, hemoptysis,  orthopnea pnd or leg swelling, presyncope, palpitations, abdominal pain, anorexia, nausea, vomiting, diarrhea  or change in bowel or bladder habits, change in stools or urine, dysuria,hematuria,  rash, arthralgias, visual complaints, headache, numbness, weakness or ataxia or problems with walking or coordination,  change in mood/affect or memory.             Objective:   Physical Exam  amb bf nad    04/07/2015 207  > 04/21/2015 208 > 06/01/2015  210>211 09/08/2015 > 01/30/2016    184    Vital signs reviewed  / fever noted   HEENT: nl dentition, turbinates, and orophanx. Nl external ear canals without cough reflex   NECK :  without JVD/Nodes/TM/ nl carotid upstrokes bilaterally   LUNGS: no acc muscle use,bilateral exp > insp wheezing/ exp cough mod reduction in air movement but no increased wob    CV:  RRR  no s3 or murmur or increase in P2, no edema   ABD:  soft and nontender with nl excursion in the supine position. No bruits or organomegaly, bowel sounds nl  MS:  warm without deformities, calf tenderness, cyanosis or clubbing  SKIN: warm and dry without lesions    NEURO:  alert, approp, no deficits        CXR PA and Lateral:   01/30/2016 :    I personally reviewed images and agree with radiology impression as follows:   No active lung disease. Persistent scarring at the left costophrenic angle. Mild peribronchial thickening.          Assessment & Plan:

## 2016-01-30 NOTE — Patient Instructions (Addendum)
zpak  Add prednisone if needed (if not getting better) Clotrimazole lozenge up 4 x daily  Start back on lyrica until cough is gone As long as coughing and needing cough meds strongly rec:  Try prilosec otc '20mg'$   Take 30-60 min before first meal of the day and Pepcid ac (famotidine) 20 mg one @  bedtime until cough is completely gone for at least a week without the need for cough suppression    Take delsym two tsp every 12 hours and supplement if needed with  tramadol 50 mg up to 2 every 4 hours to suppress the urge to cough. Swallowing water or using ice chips/non mint and menthol containing candies (such as lifesavers or sugarless jolly ranchers) are also effective.  You should rest your voice and avoid activities that you know make you cough.  Once you have eliminated the cough for 3 straight days try reducing the tramadol first,  then the delsym as tolerated.    Work on inhaler technique:  relax and gently blow all the way out then take a nice smooth deep breath back in, triggering the inhaler at same time you start breathing in.  Hold for up to 5 seconds if you can. Blow out thru nose. brush tongue and teetch after use with arm and hammer baking soda based toothpaste  Please remember to go to the  x-ray department downstairs for your tests - we will call you with the results when they are available.   If you are satisfied with your treatment plan,  let your doctor know and he/she can either refill your medications or you can return here when your prescription runs out.     If in any way you are not 100% satisfied,  please tell us.  If 100% better, tell your friends!  Pulmonary follow up is as needed

## 2016-01-30 NOTE — Telephone Encounter (Signed)
Per 01/30/16 OV with MW: Patient Instructions       zpak   Add prednisone if needed (if not getting better) Clotrimazole lozenge up 4 x daily   Start back on lyrica until cough is gone As long as coughing and needing cough meds strongly rec:  Try prilosec otc '20mg'$   Take 30-60 min before first meal of the day and Pepcid ac (famotidine) 20 mg one @  bedtime until cough is completely gone for at least a week without the need for cough suppression Take delsym two tsp every 12 hours and supplement if needed with  tramadol 50 mg up to 2 every 4 hours to suppress the urge to cough. Swallowing water or using ice chips/non mint and menthol containing candies (such as lifesavers or sugarless jolly ranchers) are also effective.  You should rest your voice and avoid activities that you know make you cough. Once you have eliminated the cough for 3 straight days try reducing the tramadol first,  then the delsym as tolerated.   Work on inhaler technique:  relax and gently blow all the way out then take a nice smooth deep breath back in, triggering the inhaler at same time you start breathing in.  Hold for up to 5 seconds if you can. Blow out thru nose. brush tongue and teetch after use with arm and hammer baking soda based toothpaste Please remember to go to the  x-ray department downstairs for your tests - we will call you with the results when they are available.  If you are satisfied with your treatment plan,  let your doctor know and he/she can either refill your medications or you can return here when your prescription runs out.    If in any way you are not 100% satisfied,  please tell us.  If 100% better, tell your friends! Pulmonary follow up is as needed     ---  I called spoke with pt. She was seen today. She reports no one addressed her fever and/or aches pains all over body. Today temp was 100.6. She reports it has been like this since Friday. She reports she has been taking aspirin for her fever.   Advised he did give her an ABX today to start taking. She reports she wants her fever addressed by him not the nurse. Please advise MW thanks

## 2016-01-30 NOTE — Progress Notes (Signed)
Quick Note:  Spoke with pt and notified of results per Dr. Wert. Pt verbalized understanding and denied any questions.  ______ 

## 2016-01-30 NOTE — Telephone Encounter (Signed)
I have called pt and explained that she needs to take the zithromax and also can take tylenol prn fever  She verbalized understanding and nothing further needed

## 2016-02-01 ENCOUNTER — Encounter: Payer: Self-pay | Admitting: Internal Medicine

## 2016-02-01 NOTE — Assessment & Plan Note (Signed)
Quit smoking 2007 PFT: 03/17/2014-  FEV1  1.28 (58%) ratio 66 and no better p saba, ERV 27 and DLCO 56 corrects to 77 - 11/30/2014   try dulera 100 2bid > 01/03/2015 not clear helping,  use prn > flare since mid 02/2015 cough > sob  - 04/07/2015 p extensive coaching HFA effectiveness =    90% > try dulera 100 2bid > improved 04/21/15   - 06/01/2015 p extensive coaching HFA effectiveness =    90% > try dulera 200   Acute flare probably related to viral uri/ can't rule out one of the atypicals/twar etc  rec Zpak/ Prednisone 10 mg take  4 each am x 2 days,   2 each am x 2 days,  1 each am x 2 days and stop   I had an extended discussion with the patient reviewing all relevant studies completed to date and  lasting 15 to 20 minutes of a 25 minute visit    Each maintenance medication was reviewed in detail including most importantly the difference between maintenance and prns and under what circumstances the prns are to be triggered using an action plan format that is not reflected in the computer generated alphabetically organized AVS.    Please see instructions for details which were reviewed in writing and the patient given a copy highlighting the part that I personally wrote and discussed at today's ov.

## 2016-02-01 NOTE — Assessment & Plan Note (Signed)
-   allergy profile 03/10/14  >  IgE 18 with neg RAST - sinus ct 10/25/2014 > Clear sinuses. - try off cozar 11/03/2014 >>> - neurontin 100 tid rx 11/30/14 > improved 01/03/2015 > changed to lyrica 04/2015 but not taking consistently as of 01/30/2016  - sinus CT 02/17/15 > neg    Explained the natural history of uri and why it's necessary in patients at risk to treat GERD aggressively - at least  short term -   to reduce risk of evolving cyclical cough initially  triggered by epithelial injury and a heightened sensitivty to the effects of any upper airway irritants,  most importantly acid - related - then perpetuated by epithelial injury related to the cough itself as the upper airway collapses on itself.  That is, the more sensitive the epithelium becomes once it is damaged by the virus, the more the ensuing irritability> the more the cough, the more the secondary reflux (especially in those prone to reflux) the more the irritation of the sensitive mucosa and so on in a  Classic cyclical pattern.    Therefore rec max rx for gerd/ cyclical cough including use of tramadol/ restart lyrica and f/u in 2 weeks if not better   I had an extended discussion with the patient reviewing all relevant studies completed to date and  lasting 25 minutes of a 40 minute extended acute office visit    Each maintenance medication was reviewed in detail including most importantly the difference between maintenance and prns and under what circumstances the prns are to be triggered using an action plan format that is not reflected in the computer generated alphabetically organized AVS.    Please see instructions for details which were reviewed in writing and the patient given a copy highlighting the part that I personally wrote and discussed at today's ov.

## 2016-02-05 ENCOUNTER — Encounter: Payer: Self-pay | Admitting: Internal Medicine

## 2016-02-06 NOTE — Telephone Encounter (Signed)
MW please advise. Thanks.   Patient Instructions     zpak  Add prednisone if needed (if not getting better) Clotrimazole lozenge up 4 x daily  Start back on lyrica until cough is gone As long as coughing and needing cough meds strongly rec: Try prilosec otc '20mg'$  Take 30-60 min before first meal of the day and Pepcid ac (famotidine) 20 mg one @ bedtime until cough is completely gone for at least a week without the need for cough suppression   Take delsym two tsp every 12 hours and supplement if needed with tramadol 50 mg up to 2 every 4 hours to suppress the urge to cough. Swallowing water or using ice chips/non mint and menthol containing candies (such as lifesavers or sugarless jolly ranchers) are also effective. You should rest your voice and avoid activities that you know make you cough.  Once you have eliminated the cough for 3 straight days try reducing the tramadol first, then the delsym as tolerated.   Work on inhaler technique: relax and gently blow all the way out then take a nice smooth deep breath back in, triggering the inhaler at same time you start breathing in. Hold for up to 5 seconds if you can. Blow out thru nose. brush tongue and teetch after use with arm and hammer baking soda based toothpaste  Please remember to go to the x-ray department downstairs for your tests - we will call you with the results when they are available.  If you are satisfied with your treatment plan, let your doctor know and he/she can either refill your medications or you can return here when your prescription runs out.   If in any way you are not 100% satisfied, please tell us. If 100% better, tell your friends!  Pulmonary follow up is as needed               Patient Instructions History

## 2016-03-04 ENCOUNTER — Other Ambulatory Visit: Payer: Self-pay | Admitting: Internal Medicine

## 2016-03-13 DIAGNOSIS — N08 Glomerular disorders in diseases classified elsewhere: Secondary | ICD-10-CM | POA: Diagnosis not present

## 2016-03-13 DIAGNOSIS — I129 Hypertensive chronic kidney disease with stage 1 through stage 4 chronic kidney disease, or unspecified chronic kidney disease: Secondary | ICD-10-CM | POA: Diagnosis not present

## 2016-03-13 DIAGNOSIS — E1122 Type 2 diabetes mellitus with diabetic chronic kidney disease: Secondary | ICD-10-CM | POA: Diagnosis not present

## 2016-03-13 DIAGNOSIS — E559 Vitamin D deficiency, unspecified: Secondary | ICD-10-CM | POA: Diagnosis not present

## 2016-03-13 DIAGNOSIS — R413 Other amnesia: Secondary | ICD-10-CM | POA: Diagnosis not present

## 2016-03-13 DIAGNOSIS — N182 Chronic kidney disease, stage 2 (mild): Secondary | ICD-10-CM | POA: Diagnosis not present

## 2016-03-13 DIAGNOSIS — Z0000 Encounter for general adult medical examination without abnormal findings: Secondary | ICD-10-CM | POA: Diagnosis not present

## 2016-04-05 ENCOUNTER — Other Ambulatory Visit: Payer: Self-pay | Admitting: Internal Medicine

## 2016-04-09 ENCOUNTER — Other Ambulatory Visit: Payer: Self-pay | Admitting: Internal Medicine

## 2016-04-11 ENCOUNTER — Telehealth: Payer: Self-pay | Admitting: Internal Medicine

## 2016-04-11 NOTE — Telephone Encounter (Signed)
Pt calling requesting refill of her Bisoprolol '5mg'$  (prescribed initially 01/04/15 by MW) Pt states that her pharmacy has been requesting refills of this medication (90-day supply) and our office keeps denying the refill.  Advised that we do not follow her for her BP and even though MW prescribed the medication, she will need to get refills from her PCP.  Per 1.6.16 mychart message from pt: Message     I just realized that during my last appointment on Tuesday, we didn't discuss if I should continue taking Bystolic. I previously mentioned that this prescription was very expensive so if I am to continue this type med, I would need something less expensive. FYI, I am completely out of this med. Thank you.       Per the 1.5.16 ov w/ MW: Essential hypertension - Tanda Rockers, MD at 01/03/2015 11:47 AM     Status: Written Related Problem: Essential hypertension   Expand All Collapse All  Trial off acei and on bystolic 10/01/1593 > good control> f/u primary care          Telephone Encounter    Expand All Collapse All   Spoke with MW this morning as the response he gave in [facility] yesterday can not be found. He wants patient to get Bisoprolol 5 mg take 2.5 mg daily. I have sent message to patient about his and sent to pharmacy she gave in e-mail.      Pt expressed understanding. Nothing further needed.

## 2016-04-16 ENCOUNTER — Other Ambulatory Visit: Payer: Self-pay | Admitting: Internal Medicine

## 2016-05-15 ENCOUNTER — Other Ambulatory Visit: Payer: Self-pay | Admitting: Internal Medicine

## 2016-05-30 ENCOUNTER — Encounter: Payer: Self-pay | Admitting: Adult Health

## 2016-05-30 ENCOUNTER — Encounter: Payer: Self-pay | Admitting: Internal Medicine

## 2016-05-30 MED ORDER — PREDNISONE 10 MG PO TABS: ORAL_TABLET | ORAL | Status: DC

## 2016-05-30 MED ORDER — TRAMADOL HCL 50 MG PO TABS: 50.0000 mg | ORAL_TABLET | Freq: Four times a day (QID) | ORAL | Status: DC | PRN

## 2016-05-30 NOTE — Telephone Encounter (Signed)
Spoke with pt and gave recommendations. She would like rx's sent to [facility]. Rx's done. Nothing further needed.

## 2016-05-30 NOTE — Telephone Encounter (Signed)
Dr [name] please advise on patient e-mail. Pt is sick.   Message The cough is back big time. Started on Sunday with not too bad a cough, but has now escalated to constant cough, wheezing, coughing up lots of phlegm, breathing hard, very disruptive sleep. The last episode was in January. I was prescribed prednisone and Tramadol which helped tremendously. Was told that Dr. [name] would not give me the same meds unless I made an office visit. I will call and try to make appointment for today or tomorrow, before weekend.   Thank you

## 2016-05-31 ENCOUNTER — Ambulatory Visit: Payer: Medicare Other | Admitting: Adult Health

## 2016-06-19 ENCOUNTER — Other Ambulatory Visit: Payer: Self-pay | Admitting: Internal Medicine

## 2016-07-09 DIAGNOSIS — I10 Essential (primary) hypertension: Secondary | ICD-10-CM | POA: Diagnosis not present

## 2016-07-09 DIAGNOSIS — M25562 Pain in left knee: Secondary | ICD-10-CM | POA: Diagnosis not present

## 2016-07-09 DIAGNOSIS — E1122 Type 2 diabetes mellitus with diabetic chronic kidney disease: Secondary | ICD-10-CM | POA: Diagnosis not present

## 2016-07-09 DIAGNOSIS — E1165 Type 2 diabetes mellitus with hyperglycemia: Secondary | ICD-10-CM | POA: Diagnosis not present

## 2016-07-09 DIAGNOSIS — J45991 Cough variant asthma: Secondary | ICD-10-CM | POA: Diagnosis not present

## 2016-08-07 DIAGNOSIS — M9903 Segmental and somatic dysfunction of lumbar region: Secondary | ICD-10-CM | POA: Diagnosis not present

## 2016-08-07 DIAGNOSIS — M9907 Segmental and somatic dysfunction of upper extremity: Secondary | ICD-10-CM | POA: Diagnosis not present

## 2016-08-07 DIAGNOSIS — M9901 Segmental and somatic dysfunction of cervical region: Secondary | ICD-10-CM | POA: Diagnosis not present

## 2016-08-07 DIAGNOSIS — M9902 Segmental and somatic dysfunction of thoracic region: Secondary | ICD-10-CM | POA: Diagnosis not present

## 2016-08-07 DIAGNOSIS — M5431 Sciatica, right side: Secondary | ICD-10-CM | POA: Diagnosis not present

## 2016-08-07 DIAGNOSIS — M791 Myalgia: Secondary | ICD-10-CM | POA: Diagnosis not present

## 2016-08-07 DIAGNOSIS — M7541 Impingement syndrome of right shoulder: Secondary | ICD-10-CM | POA: Diagnosis not present

## 2016-08-13 ENCOUNTER — Other Ambulatory Visit: Payer: Self-pay | Admitting: Internal Medicine

## 2016-08-13 ENCOUNTER — Encounter: Payer: Self-pay | Admitting: Internal Medicine

## 2016-08-13 MED ORDER — FAMOTIDINE 20 MG PO TABS: 20.0000 mg | ORAL_TABLET | Freq: Every day | ORAL | 6 refills | Status: DC

## 2016-08-13 MED ORDER — MONTELUKAST SODIUM 10 MG PO TABS: ORAL_TABLET | ORAL | 6 refills | Status: DC

## 2016-08-13 NOTE — Telephone Encounter (Signed)
Spoke with pt and explained that Bisoprolol should now be going to PCP. Pt states that she was not aware of this, even though OV notes mention to follow up with PCP re:htn. Pt also says that refill request for Pepcid went unanswered. I do not show any refill requests for this medication. Advised pt that we can send this and the Singulair to pharmacy for her. Nothing further needed.

## 2016-08-14 ENCOUNTER — Other Ambulatory Visit: Payer: Self-pay | Admitting: Internal Medicine

## 2016-09-25 DIAGNOSIS — Z23 Encounter for immunization: Secondary | ICD-10-CM | POA: Diagnosis not present

## 2016-09-25 DIAGNOSIS — H10413 Chronic giant papillary conjunctivitis, bilateral: Secondary | ICD-10-CM | POA: Diagnosis not present

## 2016-09-25 DIAGNOSIS — E119 Type 2 diabetes mellitus without complications: Secondary | ICD-10-CM | POA: Diagnosis not present

## 2016-09-25 DIAGNOSIS — M25511 Pain in right shoulder: Secondary | ICD-10-CM | POA: Diagnosis not present

## 2016-09-25 DIAGNOSIS — H40013 Open angle with borderline findings, low risk, bilateral: Secondary | ICD-10-CM | POA: Diagnosis not present

## 2016-09-25 DIAGNOSIS — M25551 Pain in right hip: Secondary | ICD-10-CM | POA: Diagnosis not present

## 2016-09-25 DIAGNOSIS — H2513 Age-related nuclear cataract, bilateral: Secondary | ICD-10-CM | POA: Diagnosis not present

## 2016-09-28 ENCOUNTER — Inpatient Hospital Stay (HOSPITAL_COMMUNITY)
Admission: EM | Admit: 2016-09-28 | Discharge: 2016-10-02 | DRG: 190 | Disposition: A | Discharge status: Home / Self Care | Payer: Medicare Other | Attending: Internal Medicine | Admitting: Internal Medicine

## 2016-09-28 ENCOUNTER — Encounter (HOSPITAL_COMMUNITY): Payer: Self-pay

## 2016-09-28 ENCOUNTER — Emergency Department (HOSPITAL_COMMUNITY): Payer: Medicare Other

## 2016-09-28 DIAGNOSIS — J45901 Unspecified asthma with (acute) exacerbation: Secondary | ICD-10-CM | POA: Diagnosis not present

## 2016-09-28 DIAGNOSIS — J9602 Acute respiratory failure with hypercapnia: Secondary | ICD-10-CM | POA: Diagnosis present

## 2016-09-28 DIAGNOSIS — I10 Essential (primary) hypertension: Secondary | ICD-10-CM | POA: Diagnosis not present

## 2016-09-28 DIAGNOSIS — R0602 Shortness of breath: Secondary | ICD-10-CM | POA: Diagnosis not present

## 2016-09-28 DIAGNOSIS — Z8249 Family history of ischemic heart disease and other diseases of the circulatory system: Secondary | ICD-10-CM

## 2016-09-28 DIAGNOSIS — F22 Delusional disorders: Secondary | ICD-10-CM | POA: Diagnosis not present

## 2016-09-28 DIAGNOSIS — R451 Restlessness and agitation: Secondary | ICD-10-CM | POA: Diagnosis present

## 2016-09-28 DIAGNOSIS — B9710 Unspecified enterovirus as the cause of diseases classified elsewhere: Secondary | ICD-10-CM | POA: Diagnosis present

## 2016-09-28 DIAGNOSIS — Z7982 Long term (current) use of aspirin: Secondary | ICD-10-CM

## 2016-09-28 DIAGNOSIS — F419 Anxiety disorder, unspecified: Secondary | ICD-10-CM | POA: Diagnosis present

## 2016-09-28 DIAGNOSIS — B9789 Other viral agents as the cause of diseases classified elsewhere: Secondary | ICD-10-CM | POA: Diagnosis present

## 2016-09-28 DIAGNOSIS — Z9889 Other specified postprocedural states: Secondary | ICD-10-CM | POA: Diagnosis not present

## 2016-09-28 DIAGNOSIS — J441 Chronic obstructive pulmonary disease with (acute) exacerbation: Secondary | ICD-10-CM | POA: Diagnosis not present

## 2016-09-28 DIAGNOSIS — Z79899 Other long term (current) drug therapy: Secondary | ICD-10-CM | POA: Diagnosis not present

## 2016-09-28 DIAGNOSIS — R069 Unspecified abnormalities of breathing: Secondary | ICD-10-CM | POA: Diagnosis not present

## 2016-09-28 DIAGNOSIS — Z90710 Acquired absence of both cervix and uterus: Secondary | ICD-10-CM | POA: Diagnosis not present

## 2016-09-28 DIAGNOSIS — J208 Acute bronchitis due to other specified organisms: Secondary | ICD-10-CM | POA: Diagnosis not present

## 2016-09-28 DIAGNOSIS — R0902 Hypoxemia: Secondary | ICD-10-CM | POA: Diagnosis present

## 2016-09-28 DIAGNOSIS — J449 Chronic obstructive pulmonary disease, unspecified: Secondary | ICD-10-CM | POA: Diagnosis not present

## 2016-09-28 DIAGNOSIS — J209 Acute bronchitis, unspecified: Secondary | ICD-10-CM | POA: Diagnosis present

## 2016-09-28 DIAGNOSIS — E119 Type 2 diabetes mellitus without complications: Secondary | ICD-10-CM | POA: Diagnosis present

## 2016-09-28 DIAGNOSIS — J440 Chronic obstructive pulmonary disease with acute lower respiratory infection: Secondary | ICD-10-CM | POA: Diagnosis not present

## 2016-09-28 DIAGNOSIS — J9601 Acute respiratory failure with hypoxia: Secondary | ICD-10-CM

## 2016-09-28 DIAGNOSIS — B348 Other viral infections of unspecified site: Secondary | ICD-10-CM

## 2016-09-28 DIAGNOSIS — B974 Respiratory syncytial virus as the cause of diseases classified elsewhere: Secondary | ICD-10-CM | POA: Diagnosis not present

## 2016-09-28 DIAGNOSIS — R059 Cough, unspecified: Secondary | ICD-10-CM | POA: Diagnosis present

## 2016-09-28 DIAGNOSIS — J4541 Moderate persistent asthma with (acute) exacerbation: Secondary | ICD-10-CM | POA: Diagnosis not present

## 2016-09-28 DIAGNOSIS — J310 Chronic rhinitis: Secondary | ICD-10-CM | POA: Diagnosis present

## 2016-09-28 DIAGNOSIS — F411 Generalized anxiety disorder: Secondary | ICD-10-CM | POA: Diagnosis not present

## 2016-09-28 DIAGNOSIS — Z87891 Personal history of nicotine dependence: Secondary | ICD-10-CM | POA: Diagnosis not present

## 2016-09-28 DIAGNOSIS — Z833 Family history of diabetes mellitus: Secondary | ICD-10-CM

## 2016-09-28 DIAGNOSIS — K219 Gastro-esophageal reflux disease without esophagitis: Secondary | ICD-10-CM | POA: Diagnosis present

## 2016-09-28 DIAGNOSIS — R05 Cough: Secondary | ICD-10-CM

## 2016-09-28 HISTORY — DX: Essential (primary) hypertension: I10

## 2016-09-28 LAB — BASIC METABOLIC PANEL
Anion gap: 10 (ref 5–15)
BUN: 5 mg/dL — ABNORMAL LOW (ref 6–20)
CO2: 26 mmol/L (ref 22–32)
Calcium: 9.1 mg/dL (ref 8.9–10.3)
Chloride: 108 mmol/L (ref 101–111)
Creatinine, Ser: 0.74 mg/dL (ref 0.44–1.00)
GFR calc Af Amer: >60 mL/min (ref >60)
GFR calc non Af Amer: >60 mL/min (ref >60)
Glucose, Bld: 174 mg/dL — ABNORMAL HIGH (ref 65–99)
Potassium: 3.6 mmol/L (ref 3.5–5.1)
Sodium: 144 mmol/L (ref 135–145)

## 2016-09-28 LAB — CBC WITH DIFFERENTIAL/PLATELET
Basophils Absolute: 0 10*3/uL (ref 0.0–0.1)
Basophils Relative: 0 %
Eosinophils Absolute: 0.1 10*3/uL (ref 0.0–0.7)
Eosinophils Relative: 1 %
HCT: 40.5 % (ref 36.0–46.0)
Hemoglobin: 13.7 g/dL (ref 12.0–15.0)
Lymphocytes Relative: 20 %
Lymphs Abs: 2 10*3/uL (ref 0.7–4.0)
MCH: 30.4 pg (ref 26.0–34.0)
MCHC: 33.8 g/dL (ref 30.0–36.0)
MCV: 90 fL (ref 78.0–100.0)
Monocytes Absolute: 0.3 10*3/uL (ref 0.1–1.0)
Monocytes Relative: 3 %
Neutro Abs: 7.5 10*3/uL (ref 1.7–7.7)
Neutrophils Relative %: 76 %
Platelets: 335 10*3/uL (ref 150–400)
RBC: 4.5 MIL/uL (ref 3.87–5.11)
RDW: 13.4 % (ref 11.5–15.5)
WBC: 9.9 10*3/uL (ref 4.0–10.5)

## 2016-09-28 LAB — GLUCOSE, CAPILLARY
Glucose-Capillary: 161 mg/dL — ABNORMAL HIGH (ref 65–99)
Glucose-Capillary: 167 mg/dL — ABNORMAL HIGH (ref 65–99)

## 2016-09-28 LAB — TROPONIN I: Troponin I: <0.03 ng/mL (ref <0.03)

## 2016-09-28 MED ORDER — FAMOTIDINE 20 MG PO TABS
20.0000 mg | ORAL_TABLET | Freq: Every day | ORAL | Status: DC
  Administered 2016-09-28: 20 mg via ORAL
  Filled 2016-09-28: qty 1

## 2016-09-28 MED ORDER — ROSUVASTATIN CALCIUM 20 MG PO TABS
20.0000 mg | ORAL_TABLET | Freq: Every evening | ORAL | Status: DC
  Administered 2016-09-28 – 2016-10-01 (×4): 20 mg via ORAL
  Filled 2016-09-28 (×4): qty 1

## 2016-09-28 MED ORDER — ALBUTEROL SULFATE (2.5 MG/3ML) 0.083% IN NEBU
5.0000 mg | INHALATION_SOLUTION | Freq: Once | RESPIRATORY_TRACT | Status: AC
  Administered 2016-09-28: 5 mg via RESPIRATORY_TRACT
  Filled 2016-09-28: qty 6

## 2016-09-28 MED ORDER — IPRATROPIUM BROMIDE 0.02 % IN SOLN
0.5000 mg | Freq: Once | RESPIRATORY_TRACT | Status: AC
  Administered 2016-09-28: 0.5 mg via RESPIRATORY_TRACT
  Filled 2016-09-28: qty 2.5

## 2016-09-28 MED ORDER — ALBUTEROL (5 MG/ML) CONTINUOUS INHALATION SOLN
10.0000 mg/h | INHALATION_SOLUTION | RESPIRATORY_TRACT | Status: DC
  Administered 2016-09-28: 10 mg/h via RESPIRATORY_TRACT

## 2016-09-28 MED ORDER — ASPIRIN 81 MG PO CHEW
81.0000 mg | CHEWABLE_TABLET | Freq: Every day | ORAL | Status: DC
  Administered 2016-09-28 – 2016-10-02 (×5): 81 mg via ORAL
  Filled 2016-09-28 (×5): qty 1

## 2016-09-28 MED ORDER — ACETAMINOPHEN 650 MG RE SUPP: 650.0000 mg | Freq: Four times a day (QID) | RECTAL | Status: DC | PRN

## 2016-09-28 MED ORDER — ALBUTEROL SULFATE (2.5 MG/3ML) 0.083% IN NEBU: 10.0000 mg | INHALATION_SOLUTION | Freq: Once | RESPIRATORY_TRACT | Status: DC

## 2016-09-28 MED ORDER — INSULIN ASPART 100 UNIT/ML ~~LOC~~ SOLN: 0.0000 [IU] | Freq: Every day | SUBCUTANEOUS | Status: DC

## 2016-09-28 MED ORDER — METHYLPREDNISOLONE SODIUM SUCC 125 MG IJ SOLR
60.0000 mg | Freq: Four times a day (QID) | INTRAMUSCULAR | Status: DC
  Administered 2016-09-29 – 2016-09-30 (×6): 60 mg via INTRAVENOUS
  Filled 2016-09-28 (×6): qty 2

## 2016-09-28 MED ORDER — ONDANSETRON HCL 4 MG PO TABS: 4.0000 mg | ORAL_TABLET | Freq: Four times a day (QID) | ORAL | Status: DC | PRN

## 2016-09-28 MED ORDER — ACETAMINOPHEN 325 MG PO TABS
650.0000 mg | ORAL_TABLET | Freq: Four times a day (QID) | ORAL | Status: DC | PRN
  Administered 2016-09-28: 650 mg via ORAL
  Filled 2016-09-28: qty 2

## 2016-09-28 MED ORDER — ORAL CARE MOUTH RINSE
15.0000 mL | Freq: Two times a day (BID) | OROMUCOSAL | Status: DC
  Administered 2016-09-28 – 2016-10-02 (×6): 15 mL via OROMUCOSAL

## 2016-09-28 MED ORDER — METHYLPREDNISOLONE SODIUM SUCC 125 MG IJ SOLR
40.0000 mg | Freq: Four times a day (QID) | INTRAMUSCULAR | Status: DC
  Administered 2016-09-28: 40 mg via INTRAVENOUS
  Filled 2016-09-28: qty 2

## 2016-09-28 MED ORDER — ALBUTEROL (5 MG/ML) CONTINUOUS INHALATION SOLN
10.0000 mg/h | INHALATION_SOLUTION | RESPIRATORY_TRACT | Status: AC
  Administered 2016-09-28: 10 mg/h via RESPIRATORY_TRACT
  Filled 2016-09-28 (×2): qty 20

## 2016-09-28 MED ORDER — IPRATROPIUM-ALBUTEROL 0.5-2.5 (3) MG/3ML IN SOLN
3.0000 mL | Freq: Four times a day (QID) | RESPIRATORY_TRACT | Status: DC
  Administered 2016-09-28: 3 mL via RESPIRATORY_TRACT
  Filled 2016-09-28: qty 3

## 2016-09-28 MED ORDER — MOMETASONE FURO-FORMOTEROL FUM 200-5 MCG/ACT IN AERO
2.0000 | INHALATION_SPRAY | Freq: Two times a day (BID) | RESPIRATORY_TRACT | Status: DC
  Administered 2016-09-28: 2 via RESPIRATORY_TRACT
  Filled 2016-09-28: qty 8.8

## 2016-09-28 MED ORDER — ONDANSETRON HCL 4 MG/2ML IJ SOLN: 4.0000 mg | Freq: Four times a day (QID) | INTRAMUSCULAR | Status: DC | PRN

## 2016-09-28 MED ORDER — SENNOSIDES-DOCUSATE SODIUM 8.6-50 MG PO TABS: 1.0000 | ORAL_TABLET | Freq: Every evening | ORAL | Status: DC | PRN

## 2016-09-28 MED ORDER — ZOLPIDEM TARTRATE 5 MG PO TABS: 5.0000 mg | ORAL_TABLET | Freq: Every evening | ORAL | Status: DC | PRN

## 2016-09-28 MED ORDER — ENOXAPARIN SODIUM 40 MG/0.4ML ~~LOC~~ SOLN
40.0000 mg | SUBCUTANEOUS | Status: DC
  Filled 2016-09-28 (×3): qty 0.4

## 2016-09-28 MED ORDER — MONTELUKAST SODIUM 10 MG PO TABS
10.0000 mg | ORAL_TABLET | Freq: Every day | ORAL | Status: DC
  Administered 2016-09-28 – 2016-10-01 (×4): 10 mg via ORAL
  Filled 2016-09-28 (×4): qty 1

## 2016-09-28 MED ORDER — BISOPROLOL FUMARATE 5 MG PO TABS
2.5000 mg | ORAL_TABLET | Freq: Every day | ORAL | Status: DC
  Administered 2016-09-28 – 2016-10-02 (×5): 2.5 mg via ORAL
  Filled 2016-09-28 (×3): qty 1
  Filled 2016-09-28: qty 0.5
  Filled 2016-09-28: qty 1

## 2016-09-28 MED ORDER — INSULIN ASPART 100 UNIT/ML ~~LOC~~ SOLN: 4.0000 [IU] | Freq: Three times a day (TID) | SUBCUTANEOUS | Status: DC

## 2016-09-28 MED ORDER — IPRATROPIUM-ALBUTEROL 0.5-2.5 (3) MG/3ML IN SOLN
3.0000 mL | RESPIRATORY_TRACT | Status: DC
  Administered 2016-09-28 – 2016-09-30 (×12): 3 mL via RESPIRATORY_TRACT
  Filled 2016-09-28 (×11): qty 3

## 2016-09-28 MED ORDER — HYDRALAZINE HCL 20 MG/ML IJ SOLN
5.0000 mg | Freq: Four times a day (QID) | INTRAMUSCULAR | Status: DC | PRN
  Administered 2016-09-28: 5 mg via INTRAVENOUS
  Filled 2016-09-28: qty 1

## 2016-09-28 MED ORDER — TRAMADOL HCL 50 MG PO TABS: 50.0000 mg | ORAL_TABLET | Freq: Four times a day (QID) | ORAL | Status: DC | PRN

## 2016-09-28 MED ORDER — INSULIN ASPART 100 UNIT/ML ~~LOC~~ SOLN
0.0000 [IU] | Freq: Three times a day (TID) | SUBCUTANEOUS | Status: DC
  Administered 2016-09-28: 3 [IU] via SUBCUTANEOUS

## 2016-09-28 MED ORDER — DOXYCYCLINE HYCLATE 100 MG PO TABS
100.0000 mg | ORAL_TABLET | Freq: Two times a day (BID) | ORAL | Status: DC
Start: 2016-09-28 — End: 2016-09-29
  Administered 2016-09-28 (×2): 100 mg via ORAL
  Filled 2016-09-28 (×3): qty 1

## 2016-09-28 NOTE — Progress Notes (Signed)
[facility]- Georges Mouse paged regarding pt BP 172/106. PRN orders given. RN to check manual BP prior to administration. Will continue to monitor.

## 2016-09-28 NOTE — ED Notes (Signed)
Pt placed on 2 lpm [place]. Will continue to monitor.

## 2016-09-28 NOTE — Progress Notes (Signed)
Patient having moderate amount of respiratory distress. VSS. However, she is slightly tachypneic and having tachycardic events when she moves or speaks. She is able to hold a conversation, but is not able to speak in full sentences at this time. Scheduled nebulizer given without relief of symptoms. Expiratory wheezing and tight throughout pre and post bronchodilator. RN made aware. Spoke with Dr. Maudie Mercury via telephone to request albuterol CAT and bed transfer. Plan is to complete new orders and transfer. After which, if there is no relief, MD would like a call back for further instructions on possible ABG and/or BiPAP.  ICU RT made aware. Patient updated on plan.

## 2016-09-28 NOTE — ED Notes (Signed)
Pt ambulated to restroom with oxygen desaturation.

## 2016-09-28 NOTE — ED Notes (Signed)
Per EMS pt complaint of nonproductive cough and SOB onset this morning. Pt denies other complaint, fever, or chills. Pt given 15 mg albuterol, 1 mg Atrovent, 2 g of Magnesium, and 125 mg of Solu Medrol en route with EMS. Pt denies pain put reports tightness related to SOB.

## 2016-09-28 NOTE — ED Notes (Signed)
Bed: QH22 Expected date: 09/28/16 Expected time: 9:05 AM Means of arrival: Ambulance Comments: Asthma

## 2016-09-28 NOTE — H&P (Addendum)
History and Physical  [name] EZM:[id number] DOB: 10/15/1946 DOA: 09/28/2016  Referring physician: Venora Maples PCP: [name], MD   Chief Complaint: SOB  HPI: [name] is a 70 y.o. female with Gold II COPD followed by Dr. [name] presents to the emergency department with complaints of increasing shortness of breath over the past 24 hours.  Brought to the emergency department by EMS and noted to be wheezing.  Patient was given 50 mg of albuterol, 1 mg of Atrovent, 125 mg of Solu-Medrol, 2 g of magnesium.  Patient reports some improvement in her symptoms but still reports feeling short of breath.  No unilateral leg swelling.  No fevers or chills but does report productive cough over the last 24 hours.  She has a history of COPD and follows with pulmonary.  She does not have albuterol at home per the patient.  The patient was Evaluated in the ED and they were unable to get her symptoms controlled. She is being admitted for further evaluation and management.  Review of Systems: All systems reviewed and apart from history of presenting illness, are negative.  Past Medical History:  Diagnosis Date  . Diabetes ([place])   . High cholesterol    Past Surgical History:  Procedure Laterality Date  . ABDOMINAL HYSTERECTOMY  1995  . carpel tunnel surgery  2006  . COLONOSCOPY  2014  . COLOSTOMY  2007  . colostomy let down  2008  . endoscopy  2014   Social History:  reports that she quit smoking about 10 years ago. Her smoking use included Cigarettes. She has a 10.50 pack-year smoking history. She has never used smokeless tobacco. She reports that she does not drink alcohol. Her drug history is not on file.   No Known Allergies  Family History  Problem Relation Age of Onset  . Heart attack Father   . Diabetes Father   . Diabetes Brother       Prior to Admission medications   Medication Sig Start Date End Date Taking? Authorizing Provider  aspirin 81 MG tablet Take 81 mg by  mouth daily.    Yes Historical Provider, MD  bisoprolol (ZEBETA) 5 MG tablet TAKE 1/2 TAB BY MOUTH DAILY Patient taking differently: TAKE 2.5 mg BY MOUTH DAILY 03/05/16  Yes Tanda Rockers, MD  CALCIUM PO Take 1 tablet by mouth daily.   Yes Historical Provider, MD  Cholecalciferol (VITAMIN D3) 2000 UNITS TABS Take 2,000 Units by mouth daily.   Yes Historical Provider, MD  famotidine (PEPCID) 20 MG tablet Take 1 tablet (20 mg total) by mouth at bedtime. 08/13/16  Yes Tanda Rockers, MD  Hydrocodone-Acetaminophen 5-300 MG TABS TAKE 1 TABLET BY MOUTH EVERY 6 TO 8 HOURS AS NEEDED FOR PAIN 09/06/16  Yes Historical Provider, MD  Liraglutide (VICTOZA) 18 MG/3ML SOPN Inject 18 mg into the skin daily.   Yes Historical Provider, MD  LYRICA 75 MG capsule Take 75 mg by mouth daily. 07/30/16  Yes Historical Provider, MD  meloxicam (MOBIC) 15 MG tablet Take 15 mg by mouth daily. 09/25/16  Yes Historical Provider, MD  mometasone-formoterol (DULERA) 200-5 MCG/ACT AERO Take 2 puffs first thing in am and then another 2 puffs about 12 hours later. Patient taking differently: Inhale 2 puffs into the lungs every 12 (twelve) hours. Take 2 puffs first thing in am and then another 2 puffs about 12 hours later. 06/01/15  Yes Tanda Rockers, MD  montelukast (SINGULAIR) 10 MG tablet TAKE  1 TABLET (10 MG TOTAL) BY MOUTH AT BEDTIME. Patient taking differently: Take 10 mg by mouth at bedtime. TAKE 1 TABLET (10 MG TOTAL) BY MOUTH AT BEDTIME. 08/13/16  Yes Tanda Rockers, MD  rosuvastatin (CRESTOR) 20 MG tablet Take 20 mg by mouth every evening.    Yes Historical Provider, MD  traMADol (ULTRAM) 50 MG tablet Take 1 tablet (50 mg total) by mouth every 6 (six) hours as needed. Patient taking differently: Take 50 mg by mouth every 6 (six) hours as needed for moderate pain.  05/30/16  Yes Tanda Rockers, MD  clotrimazole (MYCELEX) 10 MG troche Take 1 tablet (10 mg total) by mouth 5 (five) times daily. Patient not taking: Reported on 09/28/2016  01/30/16   Tanda Rockers, MD  montelukast (SINGULAIR) 10 MG tablet TAKE 1 TABLET (10 MG TOTAL) BY MOUTH AT BEDTIME. Patient not taking: Reported on 09/28/2016 08/14/16   Tanda Rockers, MD  predniSONE (DELTASONE) 10 MG tablet TAKE 4 TABLETS FOR 2 DAYS, TAKE 2 TABLETS FOR 2 DAYS, TAKE 1 TABLET FOR 2 DAYS THEN STOP Patient not taking: Reported on 09/28/2016 05/30/16   Tanda Rockers, MD   Physical Exam: Vitals:   09/28/16 1231 09/28/16 1240 09/28/16 1245 09/28/16 1335  BP: 138/62     Pulse: 110  117   Resp: (!) 29  (!) 35   Temp:      TempSrc:      SpO2: 96% (!) 85% 94% 95%  Weight:      Height:         General exam: Moderately built and nourished patient, lying comfortably supine on the gurney in no obvious distress.  Head, eyes and ENT: Nontraumatic and normocephalic. Pupils equally reacting to light and accommodation. Oral mucosa moist.  Neck: Supple. No JVD, carotid bruit or thyromegaly.  Lymphatics: No lymphadenopathy.  Respiratory system: bilateral diffuse inspiratory and expiratory wheezes with increased work of breathing noted..  Cardiovascular system: S1 and S2 heard,tachycardic. No JVD, murmurs, gallops, clicks or pedal edema.  Gastrointestinal system: Abdomen is nondistended, soft and nontender. Normal bowel sounds heard. No organomegaly or masses appreciated.  Central nervous system: Alert and oriented. No focal neurological deficits.  Extremities: Symmetric 5 x 5 power. Peripheral pulses symmetrically felt.   Skin: No rashes or acute findings.  Musculoskeletal system: Negative exam.  Psychiatry: Pleasant and cooperative.   Labs on Admission:  Basic Metabolic Panel:  Recent Labs Lab 09/28/16 1015  NA 144  K 3.6  CL 108  CO2 26  GLUCOSE 174*  BUN 5*  CREATININE 0.74  CALCIUM 9.1   Liver Function Tests: No results for input(s): AST, ALT, ALKPHOS, BILITOT, PROT, ALBUMIN in the last 168 hours. No results for input(s): LIPASE, AMYLASE in the last 168  hours. No results for input(s): AMMONIA in the last 168 hours. CBC:  Recent Labs Lab 09/28/16 1015  WBC 9.9  NEUTROABS 7.5  HGB 13.7  HCT 40.5  MCV 90.0  PLT 335   Cardiac Enzymes:  Recent Labs Lab 09/28/16 1015  TROPONINI <0.03    BNP (last 3 results) No results for input(s): PROBNP in the last 8760 hours. CBG: No results for input(s): GLUCAP in the last 168 hours.  Radiological Exams on Admission: Dg Chest Port 1 View  Result Date: 09/28/2016 CLINICAL DATA:  Shortness burr, chest tightness, cough and congestion. EXAM: PORTABLE CHEST 1 VIEW COMPARISON:  Chest x-rays dated 01/30/2016 in 09/2020 2015. FINDINGS: Heart size is normal. Overall cardiomediastinal silhouette  is stable in size and configuration. Atherosclerotic changes noted at the aortic arch. Lungs are hyperexpanded suggesting COPD. Lungs appear clear. Osseous structures about the chest are unremarkable. IMPRESSION: 1. No active disease.  No evidence of pneumonia seen. 2. Hyperexpanded lungs suggesting COPD. 3. Aortic atherosclerosis. Electronically Signed   By: Franki Cabot M.D.   On: 09/28/2016 11:41    EKG: Independently reviewed.   Assessment/Plan Principal Problem:   COPD with acute exacerbation (HCC) Active Problems:   SOB (shortness of breath) on exertion   COPD GOLD II    Rhinitis, nonallergic   Cough   Essential hypertension   Hypoxia   Acute exacerbation of chronic obstructive pulmonary disease (COPD) ([place])  1. COPD with acute exacerbation-unable to get symptoms controlled in the ED, admitted for further evaluation and management. Continue IV steroids, doxycycline, scheduled  Neb treatments. 2. hypoxia-secondary to acute COPD exacerbation, ordered supplemental oxygen as needed. 3. Cough-secondary to  COPD. Chest x-ray does not show infiltrate. 4. Essential hypertension-resuming home medications and following. 5. Diabetes mellitus type 2-carbohydrate modified diet, sliding scale coverage and  prandial coverage ordered. Monitor blood glucose closely.   DVT Prophylaxis: Lovenox Code Status:full Family Communication: husband at bedside Disposition Plan: home when medically stabilized  Irwin Brakeman, MD [facility] Pager [phone]  If 7PM-7AM, please contact night-coverage [email or web address] Password [email or web address] 09/28/2016, 1:36 PM

## 2016-09-28 NOTE — ED Provider Notes (Signed)
[name] Provider Note   CSN: [id number] Arrival date & time: 09/28/16  0909     History   Chief Complaint Chief Complaint  Patient presents with  . Shortness of Breath    HPI [name] is a 70 y.o. female.  Patient presents to the emergency department with complaints of increasing shortness of breath over the past 24 hours.  Brought to the emergency department by EMS and noted to be wheezing.  Patient was given 50 mg of albuterol, 1 mg of Atrovent, 125 mg of Solu-Medrol, 2 g of magnesium.  Patient reports some improvement in her symptoms but still reports feeling short of breath.  No unilateral leg swelling.  No fevers or chills but does report productive cough over the last 24 hours.  She has a history of COPD and follows with pulmonary.  She does not have albuterol at home per the patient.   The history is provided by the patient.    Past Medical History:  Diagnosis Date  . Diabetes ([place])   . High cholesterol     Patient Active Problem List   Diagnosis Date Noted  . Essential hypertension 11/03/2014  . Cough 10/19/2014  . COPD GOLD II  03/21/2014  . Rhinitis, nonallergic 03/21/2014    Past Surgical History:  Procedure Laterality Date  . ABDOMINAL HYSTERECTOMY  1995  . carpel tunnel surgery  2006  . COLONOSCOPY  2014  . COLOSTOMY  2007  . colostomy let down  2008  . endoscopy  2014    OB History    No data available       Home Medications    Prior to Admission medications   Medication Sig Start Date End Date Taking? Authorizing Provider  aspirin 81 MG tablet Take 81 mg by mouth daily.    Yes Historical Provider, MD  bisoprolol (ZEBETA) 5 MG tablet TAKE 1/2 TAB BY MOUTH DAILY Patient taking differently: TAKE 2.5 mg BY MOUTH DAILY 03/05/16  Yes Tanda Rockers, MD  CALCIUM PO Take 1 tablet by mouth daily.   Yes Historical Provider, MD  Cholecalciferol (VITAMIN D3) 2000 UNITS TABS Take 2,000 Units by mouth daily.   Yes Historical Provider,  MD  famotidine (PEPCID) 20 MG tablet Take 1 tablet (20 mg total) by mouth at bedtime. 08/13/16  Yes Tanda Rockers, MD  Hydrocodone-Acetaminophen 5-300 MG TABS TAKE 1 TABLET BY MOUTH EVERY 6 TO 8 HOURS AS NEEDED FOR PAIN 09/06/16  Yes Historical Provider, MD  Liraglutide (VICTOZA) 18 MG/3ML SOPN Inject 18 mg into the skin daily.   Yes Historical Provider, MD  LYRICA 75 MG capsule Take 75 mg by mouth daily. 07/30/16  Yes Historical Provider, MD  meloxicam (MOBIC) 15 MG tablet Take 15 mg by mouth daily. 09/25/16  Yes Historical Provider, MD  mometasone-formoterol (DULERA) 200-5 MCG/ACT AERO Take 2 puffs first thing in am and then another 2 puffs about 12 hours later. Patient taking differently: Inhale 2 puffs into the lungs every 12 (twelve) hours. Take 2 puffs first thing in am and then another 2 puffs about 12 hours later. 06/01/15  Yes Tanda Rockers, MD  montelukast (SINGULAIR) 10 MG tablet TAKE 1 TABLET (10 MG TOTAL) BY MOUTH AT BEDTIME. Patient taking differently: Take 10 mg by mouth at bedtime. TAKE 1 TABLET (10 MG TOTAL) BY MOUTH AT BEDTIME. 08/13/16  Yes Tanda Rockers, MD  rosuvastatin (CRESTOR) 20 MG tablet Take 20 mg by mouth every evening.    Yes  Historical Provider, MD  traMADol (ULTRAM) 50 MG tablet Take 1 tablet (50 mg total) by mouth every 6 (six) hours as needed. Patient taking differently: Take 50 mg by mouth every 6 (six) hours as needed for moderate pain.  05/30/16  Yes Tanda Rockers, MD  clotrimazole (MYCELEX) 10 MG troche Take 1 tablet (10 mg total) by mouth 5 (five) times daily. Patient not taking: Reported on 09/28/2016 01/30/16   Tanda Rockers, MD  montelukast (SINGULAIR) 10 MG tablet TAKE 1 TABLET (10 MG TOTAL) BY MOUTH AT BEDTIME. Patient not taking: Reported on 09/28/2016 08/14/16   Tanda Rockers, MD  predniSONE (DELTASONE) 10 MG tablet TAKE 4 TABLETS FOR 2 DAYS, TAKE 2 TABLETS FOR 2 DAYS, TAKE 1 TABLET FOR 2 DAYS THEN STOP Patient not taking: Reported on 09/28/2016 05/30/16   Tanda Rockers, MD    Family History Family History  Problem Relation Age of Onset  . Heart attack Father   . Diabetes Father   . Diabetes Brother     Social History Social History  Substance Use Topics  . Smoking status: Former Smoker    Packs/day: 0.25    Years: 42.00    Types: Cigarettes    Quit date: 01/30/2006  . Smokeless tobacco: Never Used  . Alcohol use No     Allergies   Review of patient's allergies indicates no known allergies.   Review of Systems Review of Systems  All other systems reviewed and are negative.    Physical Exam Updated Vital Signs BP 138/62 (BP Location: Left Arm)   Pulse 117   Temp 98.6 F (37 C) (Oral)   Resp (!) 35   Ht '5\' 6"'$  (1.676 m)   Wt 179 lb (81.2 kg)   SpO2 94%   BMI 28.89 kg/m   Physical Exam  Constitutional: She is oriented to person, place, and time. She appears well-developed and well-nourished. No distress.  HENT:  Head: Normocephalic and atraumatic.  Eyes: EOM are normal.  Neck: Normal range of motion.  Cardiovascular: Normal rate, regular rhythm and normal heart sounds.   Pulmonary/Chest: She has wheezes.  Tachypnea.  Abdominal: Soft. She exhibits no distension. There is no tenderness.  Musculoskeletal: Normal range of motion.  Neurological: She is alert and oriented to person, place, and time.  Skin: Skin is warm and dry.  Psychiatric: She has a normal mood and affect. Judgment normal.  Nursing note and vitals reviewed.    ED Treatments / Results  Labs (all labs ordered are listed, but only abnormal results are displayed) Labs Reviewed  BASIC METABOLIC PANEL - Abnormal; Notable for the following:       Result Value   Glucose, Bld 174 (*)    BUN 5 (*)    All other components within normal limits  CBC WITH DIFFERENTIAL/PLATELET  TROPONIN I    EKG  EKG Interpretation  Date/Time:  Saturday September 28 2016 09:41:39 EDT Ventricular Rate:  101 PR Interval:    QRS Duration: 89 QT Interval:  392 QTC  Calculation: 509 R Axis:   86 Text Interpretation:  Sinus tachycardia Borderline right axis deviation Borderline T wave abnormalities Prolonged QT interval No significant change was found Confirmed by Brynden Thune  MD, Anshu Wehner ([id number]) on 09/28/2016 10:11:06 AM       Radiology Dg Chest Port 1 View  Result Date: 09/28/2016 CLINICAL DATA:  Shortness burr, chest tightness, cough and congestion. EXAM: PORTABLE CHEST 1 VIEW COMPARISON:  Chest x-rays dated 01/30/2016 in  09/2020 2015. FINDINGS: Heart size is normal. Overall cardiomediastinal silhouette is stable in size and configuration. Atherosclerotic changes noted at the aortic arch. Lungs are hyperexpanded suggesting COPD. Lungs appear clear. Osseous structures about the chest are unremarkable. IMPRESSION: 1. No active disease.  No evidence of pneumonia seen. 2. Hyperexpanded lungs suggesting COPD. 3. Aortic atherosclerosis. Electronically Signed   By: Franki Cabot M.D.   On: 09/28/2016 11:41    Procedures Procedures (including critical care time)   ++++++++++++++++++++++++++++++++++++++++++++++++++++++++++++++  CRITICAL CARE Performed by: Hoy Morn Total critical care time: 32 minutes Critical care time was exclusive of separately billable procedures and treating other patients. Critical care was necessary to treat or prevent imminent or life-threatening deterioration. Critical care was time spent personally by me on the following activities: development of treatment plan with patient and/or surrogate as well as nursing, discussions with consultants, evaluation of patient's response to treatment, examination of patient, obtaining history from patient or surrogate, ordering and performing treatments and interventions, ordering and review of laboratory studies, ordering and review of radiographic studies, pulse oximetry and re-evaluation of patient's condition.   ++++++++++++++++++++++++++++++++++++++++++++++++  Medications Ordered in  ED Medications  albuterol (PROVENTIL,VENTOLIN) solution continuous neb (10 mg/hr Nebulization New Bag/Given 09/28/16 1033)  albuterol (PROVENTIL) (2.5 MG/3ML) 0.083% nebulizer solution 5 mg (not administered)  albuterol (PROVENTIL) (2.5 MG/3ML) 0.083% nebulizer solution 5 mg (5 mg Nebulization Given 09/28/16 0943)  ipratropium (ATROVENT) nebulizer solution 0.5 mg (0.5 mg Nebulization Given 09/28/16 1033)     Initial Impression / Assessment and Plan / ED Course  I have reviewed the triage vital signs and the nursing notes.  Pertinent labs & imaging results that were available during my care of the patient were reviewed by me and considered in my medical decision making (see chart for details).  Clinical Course    Patient continues to feel short of breath despite breathing treatments here.  She is given continuous albuterol here.  She artery received Solu-Medrol.  Chest x-ray without evidence of pneumonia.  Patient is hypoxic to 85% with ambulation.  Patient be admitted to the hospital.  Given additional albuterol now.  Final Clinical Impressions(s) / ED Diagnoses   Final diagnoses:  COPD exacerbation ([place])  Hypoxia    New Prescriptions New Prescriptions   No medications on file     Jola Schmidt, MD 09/28/16 1336

## 2016-09-28 NOTE — ED Notes (Signed)
Pt can go tom floor at 14:53.

## 2016-09-28 NOTE — Progress Notes (Signed)
Patient refused ABG stick. MD aware.

## 2016-09-29 DIAGNOSIS — J208 Acute bronchitis due to other specified organisms: Secondary | ICD-10-CM

## 2016-09-29 DIAGNOSIS — J9602 Acute respiratory failure with hypercapnia: Secondary | ICD-10-CM

## 2016-09-29 DIAGNOSIS — J441 Chronic obstructive pulmonary disease with (acute) exacerbation: Secondary | ICD-10-CM

## 2016-09-29 DIAGNOSIS — F411 Generalized anxiety disorder: Secondary | ICD-10-CM

## 2016-09-29 DIAGNOSIS — J9601 Acute respiratory failure with hypoxia: Secondary | ICD-10-CM

## 2016-09-29 LAB — COMPREHENSIVE METABOLIC PANEL
ALT: 21 U/L (ref 14–54)
AST: 43 U/L — ABNORMAL HIGH (ref 15–41)
Albumin: 3.8 g/dL (ref 3.5–5.0)
Alkaline Phosphatase: 44 U/L (ref 38–126)
Anion gap: 6 (ref 5–15)
BUN: 8 mg/dL (ref 6–20)
CO2: 25 mmol/L (ref 22–32)
Calcium: 9.1 mg/dL (ref 8.9–10.3)
Chloride: 107 mmol/L (ref 101–111)
Creatinine, Ser: 0.52 mg/dL (ref 0.44–1.00)
GFR calc Af Amer: >60 mL/min (ref >60)
GFR calc non Af Amer: >60 mL/min (ref >60)
Glucose, Bld: 189 mg/dL — ABNORMAL HIGH (ref 65–99)
Potassium: 3.7 mmol/L (ref 3.5–5.1)
Sodium: 138 mmol/L (ref 135–145)
Total Bilirubin: 0.5 mg/dL (ref 0.3–1.2)
Total Protein: 7.4 g/dL (ref 6.5–8.1)

## 2016-09-29 LAB — GLUCOSE, CAPILLARY
Glucose-Capillary: 157 mg/dL — ABNORMAL HIGH (ref 65–99)
Glucose-Capillary: 219 mg/dL — ABNORMAL HIGH (ref 65–99)
Glucose-Capillary: 179 mg/dL — ABNORMAL HIGH (ref 65–99)
Glucose-Capillary: 154 mg/dL — ABNORMAL HIGH (ref 65–99)

## 2016-09-29 LAB — CBC
HCT: 37.7 % (ref 36.0–46.0)
Hemoglobin: 12.7 g/dL (ref 12.0–15.0)
MCH: 30 pg (ref 26.0–34.0)
MCHC: 33.7 g/dL (ref 30.0–36.0)
MCV: 88.9 fL (ref 78.0–100.0)
Platelets: 309 10*3/uL (ref 150–400)
RBC: 4.24 MIL/uL (ref 3.87–5.11)
RDW: 13.6 % (ref 11.5–15.5)
WBC: 8.9 10*3/uL (ref 4.0–10.5)

## 2016-09-29 LAB — MRSA PCR SCREENING
MRSA by PCR: INVALID — AB
MRSA by PCR: NEGATIVE

## 2016-09-29 MED ORDER — ALPRAZOLAM 0.25 MG PO TABS
0.2500 mg | ORAL_TABLET | Freq: Three times a day (TID) | ORAL | Status: DC | PRN
  Administered 2016-09-29 – 2016-10-01 (×4): 0.25 mg via ORAL
  Filled 2016-09-29 (×4): qty 1

## 2016-09-29 MED ORDER — DEXMEDETOMIDINE HCL IN NACL 200 MCG/50ML IV SOLN
0.4000 ug/kg/h | INTRAVENOUS | Status: DC
  Administered 2016-09-29 (×2): 0.4 ug/kg/h via INTRAVENOUS
  Administered 2016-09-29: 0.6 ug/kg/h via INTRAVENOUS
  Administered 2016-09-29: 0.4 ug/kg/h via INTRAVENOUS
  Administered 2016-09-30: 0.6 ug/kg/h via INTRAVENOUS
  Administered 2016-09-30: 0.4 ug/kg/h via INTRAVENOUS
  Filled 2016-09-29 (×6): qty 50

## 2016-09-29 MED ORDER — DOXYCYCLINE HYCLATE 100 MG IV SOLR
100.0000 mg | Freq: Two times a day (BID) | INTRAVENOUS | Status: DC
  Administered 2016-09-29 – 2016-10-01 (×5): 100 mg via INTRAVENOUS
  Filled 2016-09-29 (×5): qty 100

## 2016-09-29 MED ORDER — INSULIN ASPART 100 UNIT/ML ~~LOC~~ SOLN
0.0000 [IU] | SUBCUTANEOUS | Status: DC
  Administered 2016-09-29: 3 [IU] via SUBCUTANEOUS
  Administered 2016-09-29: 5 [IU] via SUBCUTANEOUS
  Administered 2016-09-29 – 2016-09-30 (×3): 3 [IU] via SUBCUTANEOUS
  Administered 2016-09-30 (×2): 2 [IU] via SUBCUTANEOUS
  Administered 2016-09-30: 3 [IU] via SUBCUTANEOUS
  Administered 2016-09-30 (×2): 2 [IU] via SUBCUTANEOUS
  Administered 2016-10-01 (×2): 3 [IU] via SUBCUTANEOUS

## 2016-09-29 MED ORDER — FAMOTIDINE IN NACL 20-0.9 MG/50ML-% IV SOLN
20.0000 mg | Freq: Two times a day (BID) | INTRAVENOUS | Status: DC
  Administered 2016-09-29 – 2016-09-30 (×3): 20 mg via INTRAVENOUS
  Filled 2016-09-29 (×3): qty 50

## 2016-09-29 NOTE — Consult Note (Signed)
Name: [name] MRN: [id number] DOB: 10/16/1946    ADMISSION DATE:  09/28/2016 CONSULTATION DATE:  09/29/2016  REFERRING MD :  Sharla Kidney, M.D. / [facility]  CHIEF COMPLAINT:  Acute Respiratory Failure  SIGNIFICANT EVENTS  9/30 - Admit to floor then transfer to ICU w/ respiratory distress requiring BiPAP  STUDIES:  PFT 03/17/14: FVC 1.88 L (69%) FEV1 1.23 L (58%) FEV1/FVC 0.66 negative bronchodilator response TLC 85% RV 119% DLCO uncorrected 56% PORT CXR 9/30:  Personally reviewed by me. Hyperinflation w/o focal opacity.   MICROBIOLOGY: MRSA PCR 10/1 >>  ANTIBIOTICS: Doxycycline 9/30 >>  LINES/TUBES: PIV x1  HISTORY OF PRESENT ILLNESS:  70 y.o. female with known history of moderate-severe COPD that is reportedly gold stage II by Dr. [name] from our office. Per documentation patient presented to Hospital on 9/30 with increasing shortness of breath over a 24-hour period. She was brought in by EMS and given 5 mg of albuterol, 1 mg of Atrovent, 2 g of magnesium, and 125 mg of Solu-Medrol. Patient had some improvement in her dyspnea but still reported dyspnea on admission. Patient denied any fever or chills on admission but did endorse a productive cough over the 24 hours prior to admission. Patient was scheduled for nebulizer treatments and IV steroids therapies as well as doxycycline on admission. Patient didn't have hypoxia on admission and was administered supplemental oxygen therapy. After admission she was transferred to the stepdown unit with worsening in her dyspnea and ongoing respiratory distress. She required BiPAP support and with continued BiPAP requirement I was consulted by hospitalist service to provide further recommendations. Patient does endorse multiple sick contacts through children, grandchildren, and husband. She denies any nausea, vomiting, or diarrhea but has had some chest tightness. She does endorse chest discomfort as well as abdominal discomfort with her forceful  cough. Reports cough is largely nonproductive now. Does endorse a mildly scratchy throat but no sinus congestion.  PAST MEDICAL HISTORY :  Past Medical History:  Diagnosis Date  . Diabetes ([place])   . High cholesterol   . Hypertension     PAST SURGICAL HISTORY: Past Surgical History:  Procedure Laterality Date  . ABDOMINAL HYSTERECTOMY  1995  . carpel tunnel surgery  2006  . COLONOSCOPY  2014  . COLOSTOMY  2007  . colostomy let down  2008  . endoscopy  2014    Prior to Admission medications   Medication Sig Start Date End Date Taking? Authorizing Provider  aspirin 81 MG tablet Take 81 mg by mouth daily.    Yes Historical Provider, MD  bisoprolol (ZEBETA) 5 MG tablet TAKE 1/2 TAB BY MOUTH DAILY Patient taking differently: TAKE 2.5 mg BY MOUTH DAILY 03/05/16  Yes Tanda Rockers, MD  CALCIUM PO Take 1 tablet by mouth daily.   Yes Historical Provider, MD  Cholecalciferol (VITAMIN D3) 2000 UNITS TABS Take 2,000 Units by mouth daily.   Yes Historical Provider, MD  famotidine (PEPCID) 20 MG tablet Take 1 tablet (20 mg total) by mouth at bedtime. 08/13/16  Yes Tanda Rockers, MD  Hydrocodone-Acetaminophen 5-300 MG TABS TAKE 1 TABLET BY MOUTH EVERY 6 TO 8 HOURS AS NEEDED FOR PAIN 09/06/16  Yes Historical Provider, MD  Liraglutide (VICTOZA) 18 MG/3ML SOPN Inject 18 mg into the skin daily.   Yes Historical Provider, MD  LYRICA 75 MG capsule Take 75 mg by mouth daily. 07/30/16  Yes Historical Provider, MD  meloxicam (MOBIC) 15 MG tablet Take 15 mg by mouth daily. 09/25/16  Yes Historical Provider, MD  mometasone-formoterol (DULERA) 200-5 MCG/ACT AERO Take 2 puffs first thing in am and then another 2 puffs about 12 hours later. Patient taking differently: Inhale 2 puffs into the lungs every 12 (twelve) hours. Take 2 puffs first thing in am and then another 2 puffs about 12 hours later. 06/01/15  Yes Tanda Rockers, MD  montelukast (SINGULAIR) 10 MG tablet TAKE 1 TABLET (10 MG TOTAL) BY MOUTH AT  BEDTIME. Patient taking differently: Take 10 mg by mouth at bedtime. TAKE 1 TABLET (10 MG TOTAL) BY MOUTH AT BEDTIME. 08/13/16  Yes Tanda Rockers, MD  rosuvastatin (CRESTOR) 20 MG tablet Take 20 mg by mouth every evening.    Yes Historical Provider, MD  traMADol (ULTRAM) 50 MG tablet Take 1 tablet (50 mg total) by mouth every 6 (six) hours as needed. Patient taking differently: Take 50 mg by mouth every 6 (six) hours as needed for moderate pain.  05/30/16  Yes Tanda Rockers, MD  clotrimazole (MYCELEX) 10 MG troche Take 1 tablet (10 mg total) by mouth 5 (five) times daily. Patient not taking: Reported on 09/28/2016 01/30/16   Tanda Rockers, MD  montelukast (SINGULAIR) 10 MG tablet TAKE 1 TABLET (10 MG TOTAL) BY MOUTH AT BEDTIME. Patient not taking: Reported on 09/28/2016 08/14/16   Tanda Rockers, MD  predniSONE (DELTASONE) 10 MG tablet TAKE 4 TABLETS FOR 2 DAYS, TAKE 2 TABLETS FOR 2 DAYS, TAKE 1 TABLET FOR 2 DAYS THEN STOP Patient not taking: Reported on 09/28/2016 05/30/16   Tanda Rockers, MD   No Known Allergies  FAMILY HISTORY:  Family History  Problem Relation Age of Onset  . Heart attack Father   . Diabetes Father   . Diabetes Brother    SOCIAL HISTORY: Social History   Social History  . Marital status: Married    Spouse name: N/A  . Number of children: N/A  . Years of education: N/A   Occupational History  . Retired     Former Advertising copywriter to Scientist, physiological of education   Social History Main Topics  . Smoking status: Former Smoker    Packs/day: 0.25    Years: 42.00    Types: Cigarettes    Quit date: 01/30/2006  . Smokeless tobacco: Never Used  . Alcohol use No  . Drug use: Unknown  . Sexual activity: Not Asked   Other Topics Concern  . None   Social History Narrative  . None    REVIEW OF SYSTEMS:  Unable to obtain with BiPAP mask in place.  SUBJECTIVE: As above.  VITAL SIGNS: Temp:  [97.7 F (36.5 C)-98.6 F (37 C)] 98 F (36.7 C) (10/01 0336) Pulse Rate:   [87-120] 103 (10/01 0818) Resp:  [21-40] 34 (10/01 0818) BP: (114-197)/(46-106) 143/57 (10/01 0600) SpO2:  [85 %-100 %] 100 % (10/01 0818) FiO2 (%):  [35 %-40 %] 35 % (10/01 0751) Weight:  [179 lb (81.2 kg)-181 lb 3.5 oz (82.2 kg)] 181 lb 3.5 oz (82.2 kg) (09/30 2130)  PHYSICAL EXAMINATION: General:  Awake. Alert. No family at bedside. Mildly anxious. Integument:  Warm & dry. No rash on exposed skin.  Lymphatics:  No appreciated cervical or supraclavicular lymphadenoapthy. HEENT:  BiPAP mask in place. No scleral icterus or injection. Cardiovascular:  Mildly tachycardic with regular rhythm. No edema. No appreciable JVD.  Pulmonary:  Good aeration in lung bases with wheezing for greater than 50% of exhalation phase. Normal work of breathing on BiPAP. Abdomen: Soft. Normal  bowel sounds. Nondistended. Grossly nontender. Musculoskeletal:  Normal bulk and tone. Hand grip strength 5/5 bilaterally. No joint deformity or effusion appreciated. Neurological:  Moving all 4 extremities equally. Following commands. No meningismus. Psychiatric:  Mood and affect appear congruent. Unable to completely assess given BiPAP requirement.   Recent Labs Lab 09/28/16 1015 09/29/16 0323  NA 144 138  K 3.6 3.7  CL 108 107  CO2 26 25  BUN 5* 8  CREATININE 0.74 0.52  GLUCOSE 174* 189*    Recent Labs Lab 09/28/16 1015 09/29/16 0323  HGB 13.7 12.7  HCT 40.5 37.7  WBC 9.9 8.9  PLT 335 309   Dg Chest Port 1 View  Result Date: 09/28/2016 CLINICAL DATA:  Shortness burr, chest tightness, cough and congestion. EXAM: PORTABLE CHEST 1 VIEW COMPARISON:  Chest x-rays dated 01/30/2016 in 09/2020 2015. FINDINGS: Heart size is normal. Overall cardiomediastinal silhouette is stable in size and configuration. Atherosclerotic changes noted at the aortic arch. Lungs are hyperexpanded suggesting COPD. Lungs appear clear. Osseous structures about the chest are unremarkable. IMPRESSION: 1. No active disease.  No evidence  of pneumonia seen. 2. Hyperexpanded lungs suggesting COPD. 3. Aortic atherosclerosis. Electronically Signed   By: Franki Cabot M.D.   On: 09/28/2016 11:41    ASSESSMENT / PLAN:  70 y.o. female followed by Dr. [name] in our clinic with known underlying moderate-severe/cold stage II COPD. Patient seems to have developed an acute exacerbation of her underlying COPD likely due to acute bronchitis likely viral in etiology. Given the anti-inflammatory effects of doxycycline I feel it's reasonable to continue this medication. She has no other symptoms to suggest influenza but given the potential viral etiology this will need to be ruled out. Given patient's high level of anxiety and potential for worsening of her respiratory status she will be continued on noninvasive positive pressure ventilation with Precedex infusion as an anxiolytic. I did discuss the patient's care with nurse at bedside. I'm hopeful that with escalating treatment of the patient's underlying anxiety continued treatment of her respiratory failure. We will be able to avoid endotracheal intubation.  1. Acute hypoxic respiratory failure: Continuing to wean FiO2 for saturation 88-100 percent. 2. Acute hypercarbic respiratory failure/COPD exacerbation: Continuing BiPAP support as needed for increased work of breathing. Continuing Duonebs scheduled every 4 hours & albuterol every 2 hours as needed. Continuing Solu-Medrol IV every 6 hours. Holding on additional inhaler therapies at this time. 3. Acute bronchitis: Placing patient on droplet isolation. Continuing doxycycline IV every 12 hours. Checking influenza PCR/respiratory viral panel PCR. 4. Anxiety: Continuing Xanax by mouth when necessary. Starting Precedex infusion with notification parameters and titrating for goal RASS 0. 5. Diet: Nothing by mouth except for meds. May be able to advance diet further later today if patient's respiratory status improves and she can liberate from  BiPAP. 6. Prophylaxis: Lovenox, Pepcid IV every 12 hours, & SCDs.  Sonia Baller Ashok Cordia, M.D. [facility] Pager:  [phone] After 3pm or if no response, call [phone] 09/29/2016, 8:23 AM

## 2016-09-29 NOTE — Progress Notes (Signed)
PROGRESS NOTE    [name]  XHB:[id number] DOB: [birth year] DOA: 09/28/2016 PCP: [name], MD    Brief Narrative: 70 y.o. female with Gold II COPD followed by Dr. [name] presents to the ER with complaints of increasing shortness of breath for 24 hours. Treating for COPD exacerbation and hypoxa. On Bipap and increased work of breathing today. Pulmonary consulted.   Assessment & Plan:   # COPD with acute exacerbation ([place]): Continue IV Solu-Medrol, doxycycline, bronchodilators and respiratory therapy. Patient with increased work of breathing requiring BiPAP. Unknown if it is related with viral infection, ? Acute bronchitis. On droplet precaution and checking influenza panel and respiratory panel. -Follow-up pulmonary consults evaluated.    #Acute hypoxic and hypercarbic respiratory failure in the setting of COPD exacerbation: Continue management as able. On BiPAP. Try to wean BiPAP maintaining oxygen saturation 90s.  #Anxiety: Patient was very anxious and restless this morning. Xanax as needed order. Pulmonologist is adding Precedex as needed with close monitoring.  # Essential hypertension: Continue to monitor blood pressure. On bisoprolol. Hydralazine as needed    Discussed with the patient's nurse and Dr. Ashok Cordia from pulmonary.  Continuing current medical and supportive care and close monitoring in ICU-step down setting.  DVT prophylaxis: Lovenox subcutaneous Code Status: Full code Family Communication: No family present at bedside Disposition Plan: The discharge home in 2-3 days  Consultants: Pulmonologist  Procedures: BipAp Antimicrobials:doxycycline.  Subjective: Patient was seen and examined at bedside. She was wearing BiPAP. Patient reported chest tightness and shortness of breath. Denied nausea, vomiting, headache or abdominal pain. Reports dry mouth.  Objective: Vitals:   09/29/16 0751 09/29/16 0800 09/29/16 0818 09/29/16 0900  BP:  (!) 175/152  (!) 154/71    Pulse:  (!) 103 (!) 103 88  Resp:  (!) 36 (!) 34 (!) 26  Temp:  98.1 F (36.7 C)    TempSrc:  Oral    SpO2: 100% 100% 100% 100%  Weight:      Height:        Intake/Output Summary (Last 24 hours) at 09/29/16 1111 Last data filed at 09/29/16 1000  Gross per 24 hour  Intake           175.47 ml  Output                0 ml  Net           175.47 ml   Filed Weights   09/28/16 0918 09/28/16 2130  Weight: 81.2 kg (179 lb) 82.2 kg (181 lb 3.5 oz)    Examination:  General exam: On BiPAP with increased work of breathing.  Respiratory system: Bilateral diffuse expiratory crackle, no wheezing appreciated Cardiovascular system: Regular rate rhythm, S1-S2 normal. No lower extremity edema. Gastrointestinal system: Abdomen is nondistended, soft and nontender. Normal bowel sounds heard. Central nervous system: Alert , awake and following commands. No focal deficit Extremities: Symmetric 5 x 5 power. Skin: No rashes, lesions or ulcers   Data Reviewed: I have personally reviewed following labs and imaging studies  CBC:  Recent Labs Lab 09/28/16 1015 09/29/16 0323  WBC 9.9 8.9  NEUTROABS 7.5  --   HGB 13.7 12.7  HCT 40.5 37.7  MCV 90.0 88.9  PLT 335 810   Basic Metabolic Panel:  Recent Labs Lab 09/28/16 1015 09/29/16 0323  NA 144 138  K 3.6 3.7  CL 108 107  CO2 26 25  GLUCOSE 174* 189*  BUN 5* 8  CREATININE 0.74 0.52  CALCIUM  9.1 9.1   GFR: Estimated Creatinine Clearance: 72.8 mL/min (by C-G formula based on SCr of 0.52 mg/dL). Liver Function Tests:  Recent Labs Lab 09/29/16 0323  AST 43*  ALT 21  ALKPHOS 44  BILITOT 0.5  PROT 7.4  ALBUMIN 3.8   No results for input(s): LIPASE, AMYLASE in the last 168 hours. No results for input(s): AMMONIA in the last 168 hours. Coagulation Profile: No results for input(s): INR, PROTIME in the last 168 hours. Cardiac Enzymes:  Recent Labs Lab 09/28/16 1015  TROPONINI <0.03   BNP (last 3 results) No results for  input(s): PROBNP in the last 8760 hours. HbA1C: No results for input(s): HGBA1C in the last 72 hours. CBG:  Recent Labs Lab 09/28/16 1640 09/28/16 2150 09/29/16 0754  GLUCAP 161* 167* 157*   Lipid Profile: No results for input(s): CHOL, HDL, LDLCALC, TRIG, CHOLHDL, LDLDIRECT in the last 72 hours. Thyroid Function Tests: No results for input(s): TSH, T4TOTAL, FREET4, T3FREE, THYROIDAB in the last 72 hours. Anemia Panel: No results for input(s): VITAMINB12, FOLATE, FERRITIN, TIBC, IRON, RETICCTPCT in the last 72 hours. Sepsis Labs: No results for input(s): PROCALCITON, LATICACIDVEN in the last 168 hours.  Recent Results (from the past 240 hour(s))  MRSA PCR Screening     Status: Abnormal   Collection Time: 09/28/16  9:17 PM  Result Value Ref Range Status   MRSA by PCR INVALID RESULTS, SPECIMEN SENT FOR CULTURE (A) NEGATIVE Final    Comment: S ODOM AT 7371 ON 10.01.2017 BY NBROOKS        The GeneXpert MRSA Assay (FDA approved for NASAL specimens only), is one component of a comprehensive MRSA colonization surveillance program. It is not intended to diagnose MRSA infection nor to guide or monitor treatment for MRSA infections.          Radiology Studies: Dg Chest Port 1 View  Result Date: 09/28/2016 CLINICAL DATA:  Shortness burr, chest tightness, cough and congestion. EXAM: PORTABLE CHEST 1 VIEW COMPARISON:  Chest x-rays dated 01/30/2016 in 09/2020 2015. FINDINGS: Heart size is normal. Overall cardiomediastinal silhouette is stable in size and configuration. Atherosclerotic changes noted at the aortic arch. Lungs are hyperexpanded suggesting COPD. Lungs appear clear. Osseous structures about the chest are unremarkable. IMPRESSION: 1. No active disease.  No evidence of pneumonia seen. 2. Hyperexpanded lungs suggesting COPD. 3. Aortic atherosclerosis. Electronically Signed   By: Franki Cabot M.D.   On: 09/28/2016 11:41        Scheduled Meds: . aspirin  81 mg Oral  Daily  . bisoprolol  2.5 mg Oral Daily  . doxycycline (VIBRAMYCIN) IV  100 mg Intravenous Q12H  . enoxaparin (LOVENOX) injection  40 mg Subcutaneous Q24H  . famotidine (PEPCID) IV  20 mg Intravenous Q12H  . insulin aspart  0-15 Units Subcutaneous Q4H  . ipratropium-albuterol  3 mL Nebulization Q4H  . mouth rinse  15 mL Mouth Rinse BID  . methylPREDNISolone (SOLU-MEDROL) injection  60 mg Intravenous Q6H  . montelukast  10 mg Oral QHS  . rosuvastatin  20 mg Oral QPM   Continuous Infusions: . dexmedetomidine 0.6 mcg/kg/hr (09/29/16 1000)     LOS: 1 day    Time spent: 33 minutes   Ilyaas Musto Tanna Furry, MD [facility] Pager [phone]  If 7PM-7AM, please contact night-coverage [email or web address] Password [email or web address] 09/29/2016, 11:11 AM

## 2016-09-29 NOTE — Progress Notes (Signed)
Patient refused to have nasal swab done twice, only respiratory panel obtained CCM notified.

## 2016-09-29 NOTE — Progress Notes (Signed)
After RT gave pt breathing tx she advised me that pt stated she was not feeling better after the tx.  RT also reported that pt was not moving air well in her chest, and her lungs sounds were congested and "tight" sounding. My own assessment found pt was dyspnea with increased respirations, and appeared to have difficulty breathing. She advised me that pt needed more frequent breathing treatments and monitoring. Notified provider on call, and he ordered pt to stepdown.

## 2016-09-30 LAB — GLUCOSE, CAPILLARY
Glucose-Capillary: 130 mg/dL — ABNORMAL HIGH (ref 65–99)
Glucose-Capillary: 138 mg/dL — ABNORMAL HIGH (ref 65–99)
Glucose-Capillary: 141 mg/dL — ABNORMAL HIGH (ref 65–99)
Glucose-Capillary: 154 mg/dL — ABNORMAL HIGH (ref 65–99)
Glucose-Capillary: 160 mg/dL — ABNORMAL HIGH (ref 65–99)
Glucose-Capillary: 178 mg/dL — ABNORMAL HIGH (ref 65–99)

## 2016-09-30 LAB — CBC WITH DIFFERENTIAL/PLATELET
Basophils Absolute: 0 10*3/uL (ref 0.0–0.1)
Basophils Relative: 0 %
Eosinophils Absolute: 0 10*3/uL (ref 0.0–0.7)
Eosinophils Relative: 0 %
HCT: 39.2 % (ref 36.0–46.0)
Hemoglobin: 13.2 g/dL (ref 12.0–15.0)
Lymphocytes Relative: 7 %
Lymphs Abs: 1.1 10*3/uL (ref 0.7–4.0)
MCH: 30.1 pg (ref 26.0–34.0)
MCHC: 33.7 g/dL (ref 30.0–36.0)
MCV: 89.3 fL (ref 78.0–100.0)
Monocytes Absolute: 0.5 10*3/uL (ref 0.1–1.0)
Monocytes Relative: 4 %
Neutro Abs: 13.1 10*3/uL — ABNORMAL HIGH (ref 1.7–7.7)
Neutrophils Relative %: 89 %
Platelets: 335 10*3/uL (ref 150–400)
RBC: 4.39 MIL/uL (ref 3.87–5.11)
RDW: 14 % (ref 11.5–15.5)
WBC: 14.7 10*3/uL — ABNORMAL HIGH (ref 4.0–10.5)

## 2016-09-30 LAB — RESPIRATORY PANEL BY PCR

## 2016-09-30 LAB — RENAL FUNCTION PANEL
Albumin: 3.9 g/dL (ref 3.5–5.0)
Anion gap: 6 (ref 5–15)
BUN: 18 mg/dL (ref 6–20)
CO2: 26 mmol/L (ref 22–32)
Calcium: 9.5 mg/dL (ref 8.9–10.3)
Chloride: 109 mmol/L (ref 101–111)
Creatinine, Ser: 0.6 mg/dL (ref 0.44–1.00)
GFR calc Af Amer: >60 mL/min (ref >60)
GFR calc non Af Amer: >60 mL/min (ref >60)
Glucose, Bld: 123 mg/dL — ABNORMAL HIGH (ref 65–99)
Phosphorus: 2.8 mg/dL (ref 2.5–4.6)
Potassium: 4.7 mmol/L (ref 3.5–5.1)
Sodium: 141 mmol/L (ref 135–145)

## 2016-09-30 LAB — MAGNESIUM: Magnesium: 2.4 mg/dL (ref 1.7–2.4)

## 2016-09-30 MED ORDER — METHYLPREDNISOLONE SODIUM SUCC 125 MG IJ SOLR
60.0000 mg | Freq: Two times a day (BID) | INTRAMUSCULAR | Status: DC
  Administered 2016-09-30 – 2016-10-01 (×2): 60 mg via INTRAVENOUS
  Filled 2016-09-30 (×2): qty 2

## 2016-09-30 MED ORDER — IPRATROPIUM-ALBUTEROL 0.5-2.5 (3) MG/3ML IN SOLN
3.0000 mL | Freq: Four times a day (QID) | RESPIRATORY_TRACT | Status: DC
  Administered 2016-10-01 (×4): 3 mL via RESPIRATORY_TRACT
  Filled 2016-09-30 (×4): qty 3

## 2016-09-30 MED ORDER — METHYLPREDNISOLONE SODIUM SUCC 125 MG IJ SOLR: 60.0000 mg | Freq: Three times a day (TID) | INTRAMUSCULAR | Status: DC

## 2016-09-30 MED ORDER — PANTOPRAZOLE SODIUM 40 MG PO TBEC
40.0000 mg | DELAYED_RELEASE_TABLET | Freq: Two times a day (BID) | ORAL | Status: DC
  Administered 2016-09-30 – 2016-10-02 (×5): 40 mg via ORAL
  Filled 2016-09-30 (×5): qty 1

## 2016-09-30 MED ORDER — BENZONATATE 100 MG PO CAPS
100.0000 mg | ORAL_CAPSULE | Freq: Two times a day (BID) | ORAL | Status: DC | PRN
  Administered 2016-09-30 – 2016-10-01 (×2): 100 mg via ORAL
  Filled 2016-09-30 (×2): qty 1

## 2016-09-30 MED ORDER — RISPERIDONE 0.5 MG PO TABS
0.5000 mg | ORAL_TABLET | Freq: Two times a day (BID) | ORAL | Status: DC
  Administered 2016-09-30 – 2016-10-01 (×4): 0.5 mg via ORAL
  Filled 2016-09-30: qty 2
  Filled 2016-09-30: qty 1
  Filled 2016-09-30 (×2): qty 2

## 2016-09-30 MED ORDER — PANTOPRAZOLE SODIUM 40 MG PO TBEC: 40.0000 mg | DELAYED_RELEASE_TABLET | Freq: Two times a day (BID) | ORAL | Status: DC

## 2016-09-30 MED ORDER — FAMOTIDINE 20 MG PO TABS
20.0000 mg | ORAL_TABLET | Freq: Every day | ORAL | Status: DC
Start: 2016-09-30 — End: 2016-10-02
  Administered 2016-09-30 – 2016-10-01 (×2): 20 mg via ORAL
  Filled 2016-09-30 (×2): qty 1

## 2016-09-30 MED ORDER — ALBUTEROL SULFATE (2.5 MG/3ML) 0.083% IN NEBU: 2.5000 mg | INHALATION_SOLUTION | RESPIRATORY_TRACT | Status: DC | PRN

## 2016-09-30 MED ORDER — CETIRIZINE HCL 5 MG/5ML PO SYRP
5.0000 mg | ORAL_SOLUTION | Freq: Every day | ORAL | Status: DC
Start: 2016-09-30 — End: 2016-10-02
  Administered 2016-09-30 – 2016-10-02 (×3): 5 mg via ORAL
  Filled 2016-09-30 (×5): qty 5

## 2016-09-30 NOTE — Progress Notes (Signed)
Date:  September 30, 2016 Chart reviewed for concurrent status and case management needs. Will continue to follow the patient for status change: transferred from med surg floor due to resp distress/placed on bipapwith iv solu-medrol and nebs Discharge Planning: following for needs Expected discharge date: [id number] Velva Harman, BSN, [place], [place]

## 2016-09-30 NOTE — Progress Notes (Signed)
Patient refused ABG. RN aware; MD to be notified.

## 2016-09-30 NOTE — Progress Notes (Signed)
Went to start an IV, patient requested if she can eat lunch first. RN made aware.

## 2016-09-30 NOTE — Progress Notes (Signed)
PROGRESS NOTE    [name]  XTK:[id number] DOB: January 19, 1946 DOA: 09/28/2016 PCP: [name], MD    Brief Narrative: 70 y.o. female with Gold II COPD followed by Dr. [name] presents to the ER with complaints of increasing shortness of breath for 24 hours. Treating for COPD exacerbation and hypoxa. Requiring Bipap. Pulmonary following.   Assessment & Plan:   # COPD with acute exacerbation ([place]): Mild improvement today but still has diffuse expiratory wheeze. Continue IV Solu-Medrol, doxycycline, bronchodilators and respiratory therapy. May be able to switch to oral prednisone in 1-2 days. Try to wean bipap today to nasal canula. Discussed with the RN at bedside.  -follow up viral result.  -pulmonary consult appreciated.     #Acute hypoxic and hypercarbic respiratory failure in the setting of COPD exacerbation: Continue management as able. Weaning bipap.  #Anxiety: Patient was very anxious and restless this morning. On xanax prn.  -risperidol added by pulm today.  # Essential hypertension: Continue to monitor blood pressure. On bisoprolol. Hydralazine as needed    Discussed with the patient and her nurse at bedside. Continue to monitor in the stepdown unit today.  DVT prophylaxis: Lovenox subcutaneous Code Status: Full code Family Communication: No family present at bedside Disposition Plan: The discharge home in 1-2 days  Consultants: Pulmonologist  Procedures: BipAp Antimicrobials:doxycycline.  Subjective: The patient was seen and examined at bedside. Patient reported dry mouth and wanted to eat today. Still has shortness of breath and chest pain but overall feels better than yesterday. Has coughing. Feels weak and tired. Denied chest pain, nausea, vomiting or abdominal pain. Objective: Vitals:   09/30/16 1000 09/30/16 1115 09/30/16 1200 09/30/16 1221  BP: (!) 146/73 (!) 144/71    Pulse: (!) 59 81 89 80  Resp: (!) 24 (!) 28 (!) 28 (!) 35  Temp:      TempSrc:        SpO2: 100% 99% 100% 100%  Weight:      Height:        Intake/Output Summary (Last 24 hours) at 09/30/16 1240 Last data filed at 09/30/16 1046  Gross per 24 hour  Intake          1076.32 ml  Output             1550 ml  Net          -473.68 ml   Filed Weights   09/28/16 0918 09/28/16 2130  Weight: 81.2 kg (179 lb) 82.2 kg (181 lb 3.5 oz)    Examination:  General exam: On BiPAP, looks comfortable.Marland Kitchen  Respiratory system: Mild bilateral diffuse expiratory wheeze, no crackle. Cardiovascular system: Regular rate and rhythm, S1-S2 normal. No lower extremity edema. Gastrointestinal system: Abdomen soft, nontender, nondistended. Bowel sound positive.. Central nervous system: Alert , awake and following commands. No focal deficit Extremities: Symmetric 5 x 5 power. Skin: No rashes, lesions or ulcers   Data Reviewed: I have personally reviewed following labs and imaging studies  CBC:  Recent Labs Lab 09/28/16 1015 09/29/16 0323 09/30/16 0321  WBC 9.9 8.9 14.7*  NEUTROABS 7.5  --  13.1*  HGB 13.7 12.7 13.2  HCT 40.5 37.7 39.2  MCV 90.0 88.9 89.3  PLT 335 309 992   Basic Metabolic Panel:  Recent Labs Lab 09/28/16 1015 09/29/16 0323 09/30/16 0321  NA 144 138 141  K 3.6 3.7 4.7  CL 108 107 109  CO2 '26 25 26  '$ GLUCOSE 174* 189* 123*  BUN 5* 8 18  CREATININE  0.74 0.52 0.60  CALCIUM 9.1 9.1 9.5  MG  --   --  2.4  PHOS  --   --  2.8   GFR: Estimated Creatinine Clearance: 72.8 mL/min (by C-G formula based on SCr of 0.6 mg/dL). Liver Function Tests:  Recent Labs Lab 09/29/16 0323 09/30/16 0321  AST 43*  --   ALT 21  --   ALKPHOS 44  --   BILITOT 0.5  --   PROT 7.4  --   ALBUMIN 3.8 3.9   No results for input(s): LIPASE, AMYLASE in the last 168 hours. No results for input(s): AMMONIA in the last 168 hours. Coagulation Profile: No results for input(s): INR, PROTIME in the last 168 hours. Cardiac Enzymes:  Recent Labs Lab 09/28/16 1015  TROPONINI <0.03    BNP (last 3 results) No results for input(s): PROBNP in the last 8760 hours. HbA1C: No results for input(s): HGBA1C in the last 72 hours. CBG:  Recent Labs Lab 09/29/16 1552 09/29/16 2107 09/30/16 0002 09/30/16 0321 09/30/16 0814  GLUCAP 179* 154* 160* 138* 141*   Lipid Profile: No results for input(s): CHOL, HDL, LDLCALC, TRIG, CHOLHDL, LDLDIRECT in the last 72 hours. Thyroid Function Tests: No results for input(s): TSH, T4TOTAL, FREET4, T3FREE, THYROIDAB in the last 72 hours. Anemia Panel: No results for input(s): VITAMINB12, FOLATE, FERRITIN, TIBC, IRON, RETICCTPCT in the last 72 hours. Sepsis Labs: No results for input(s): PROCALCITON, LATICACIDVEN in the last 168 hours.  Recent Results (from the past 240 hour(s))  MRSA PCR Screening     Status: Abnormal   Collection Time: 09/28/16  9:17 PM  Result Value Ref Range Status   MRSA by PCR INVALID RESULTS, SPECIMEN SENT FOR CULTURE (A) NEGATIVE Final    Comment: S ODOM AT 1610 ON 10.01.2017 BY NBROOKS        The GeneXpert MRSA Assay (FDA approved for NASAL specimens only), is one component of a comprehensive MRSA colonization surveillance program. It is not intended to diagnose MRSA infection nor to guide or monitor treatment for MRSA infections.   MRSA PCR Screening     Status: None   Collection Time: 09/29/16  5:02 AM  Result Value Ref Range Status   MRSA by PCR NEGATIVE NEGATIVE Final    Comment:        The GeneXpert MRSA Assay (FDA approved for NASAL specimens only), is one component of a comprehensive MRSA colonization surveillance program. It is not intended to diagnose MRSA infection nor to guide or monitor treatment for MRSA infections.   Respiratory Panel by PCR     Status: Abnormal   Collection Time: 09/29/16  9:50 AM  Result Value Ref Range Status   Adenovirus NOT DETECTED NOT DETECTED Final   Coronavirus 229E NOT DETECTED NOT DETECTED Final   Coronavirus HKU1 NOT DETECTED NOT DETECTED Final    Coronavirus NL63 NOT DETECTED NOT DETECTED Final   Coronavirus OC43 NOT DETECTED NOT DETECTED Final   Metapneumovirus NOT DETECTED NOT DETECTED Final   Rhinovirus / Enterovirus DETECTED (A) NOT DETECTED Final   Influenza A NOT DETECTED NOT DETECTED Final   Influenza B NOT DETECTED NOT DETECTED Final   Parainfluenza Virus 1 NOT DETECTED NOT DETECTED Final   Parainfluenza Virus 2 NOT DETECTED NOT DETECTED Final   Parainfluenza Virus 3 NOT DETECTED NOT DETECTED Final   Parainfluenza Virus 4 NOT DETECTED NOT DETECTED Final   Respiratory Syncytial Virus NOT DETECTED NOT DETECTED Final   Bordetella pertussis NOT DETECTED NOT DETECTED Final  Chlamydophila pneumoniae NOT DETECTED NOT DETECTED Final   Mycoplasma pneumoniae NOT DETECTED NOT DETECTED Final    Comment: Performed at [facility]         Radiology Studies: No results found.      Scheduled Meds: . aspirin  81 mg Oral Daily  . bisoprolol  2.5 mg Oral Daily  . cetirizine HCl  5 mg Oral Daily  . doxycycline (VIBRAMYCIN) IV  100 mg Intravenous Q12H  . enoxaparin (LOVENOX) injection  40 mg Subcutaneous Q24H  . famotidine  20 mg Oral QHS  . insulin aspart  0-15 Units Subcutaneous Q4H  . ipratropium-albuterol  3 mL Nebulization Q4H  . mouth rinse  15 mL Mouth Rinse BID  . methylPREDNISolone (SOLU-MEDROL) injection  60 mg Intravenous Q12H  . montelukast  10 mg Oral QHS  . pantoprazole  40 mg Oral BID AC  . risperiDONE  0.5 mg Oral BID  . rosuvastatin  20 mg Oral QPM   Continuous Infusions: . dexmedetomidine 0.6 mcg/kg/hr (09/30/16 1052)     LOS: 2 days    Time spent: 27 minutes   Gahel Safley Tanna Furry, MD [facility] Pager [phone]  If 7PM-7AM, please contact night-coverage [email or web address] Password [email or web address] 09/30/2016, 12:40 PM

## 2016-09-30 NOTE — Progress Notes (Signed)
Pt currently off BIPAP and tolerating 2 LPM [place] well.  Pt in no distress at this time, RT will hold BIPAP.  RT to monitor and assess as needed.

## 2016-09-30 NOTE — Progress Notes (Signed)
After returning to bed after using the [facility], pt stated, "I am feeling like before". When asked for elaboration, the patient stated, "My heart is beating out of my chest, I am having pain in my chest and I can't breath". The cardiac monitor showed a heart rate of 70 with no changes to rate or rhythm. The patients oxygen saturation was 99%. The patient was breathing 38 times per minute with Bipap in place. Attempts to assist the patient to slow her breathing were unsuccessful, with the patient replying, "I can't" before suggestions could be made. The episode lasted approximately 1 minute and the patient was able to slow her breathing and return to baseline with coaching. Once calm, the patient stated, "I know these are anxiety and I don't know why they keep happening". Will continue to monitor.

## 2016-09-30 NOTE — Progress Notes (Signed)
Name: [name] MRN: [id number] DOB: 07-05-1946    ADMISSION DATE:  09/28/2016 CONSULTATION DATE:  09/29/2016  REFERRING MD :  Sharla Kidney, M.D. / [facility]  CHIEF COMPLAINT:  Acute Respiratory Failure  SIGNIFICANT EVENTS  9/30 - Admit to floor then transfer to ICU w/ respiratory distress requiring BiPAP  STUDIES:  PFT 03/17/14: FVC 1.88 L (69%) FEV1 1.23 L (58%) FEV1/FVC 0.66 negative bronchodilator response TLC 85% RV 119% DLCO uncorrected 56% PORT CXR 9/30:  Personally reviewed by me. Hyperinflation w/o focal opacity.   MICROBIOLOGY: MRSA PCR 10/1 >> neg  ANTIBIOTICS: Doxycycline 9/30 >>  LINES/TUBES: PIV x1   SUBJECTIVE: As above.  VITAL SIGNS: Temp:  [97 F (36.1 C)-98.9 F (37.2 C)] 98.9 F (37.2 C) (10/02 0823) Pulse Rate:  [58-84] 59 (10/02 1000) Resp:  [19-30] 24 (10/02 1000) BP: (105-146)/(45-75) 146/73 (10/02 1000) SpO2:  [99 %-100 %] 100 % (10/02 1000) FiO2 (%):  [35 %] 35 % (10/02 0823) [place] PHYSICAL EXAMINATION: General:  Awake. Alert. No family at bedside. very anxious. HEENT:   NCAT. No JVD. Marked upper airway wheeze  Cardiovascular:  Mildly tachycardic with regular rhythm. No edema. No appreciable JVD.  Pulmonary:  Good aeration in lung bases with wheezing t/o (seems to be radiating from Upper airway) Abdomen: Soft. Normal bowel sounds. Nondistended. Grossly nontender. Musculoskeletal:  Normal bulk and tone. Hand grip strength 5/5 bilaterally. No joint deformity or effusion appreciated. Neurological:  Moving all 4 extremities equally. Following commands. No meningismus. Psychiatric:  Mood and affect appear congruent. Unable to completely assess given BiPAP requirement.   Recent Labs Lab 09/28/16 1015 09/29/16 0323 09/30/16 0321  NA 144 138 141  K 3.6 3.7 4.7  CL 108 107 109  CO2 '26 25 26  '$ BUN 5* 8 18  CREATININE 0.74 0.52 0.60  GLUCOSE 174* 189* 123*    Recent Labs Lab 09/28/16 1015 09/29/16 0323 09/30/16 0321  HGB 13.7 12.7  13.2  HCT 40.5 37.7 39.2  WBC 9.9 8.9 14.7*  PLT 335 309 335   Dg Chest Port 1 View  Result Date: 09/28/2016 CLINICAL DATA:  Shortness burr, chest tightness, cough and congestion. EXAM: PORTABLE CHEST 1 VIEW COMPARISON:  Chest x-rays dated 01/30/2016 in 09/2020 2015. FINDINGS: Heart size is normal. Overall cardiomediastinal silhouette is stable in size and configuration. Atherosclerotic changes noted at the aortic arch. Lungs are hyperexpanded suggesting COPD. Lungs appear clear. Osseous structures about the chest are unremarkable. IMPRESSION: 1. No active disease.  No evidence of pneumonia seen. 2. Hyperexpanded lungs suggesting COPD. 3. Aortic atherosclerosis. Electronically Signed   By: Franki Cabot M.D.   On: 09/28/2016 11:41    ASSESSMENT / PLAN:  Acute hypoxic and hypercarbic respiratory failure AECOPD LPR flare Severe anxiety  Cyclic cough Severe anxiety Paranoia  Occult reflux   Pulmonary Acute hypoxic respiratory failure: Acute hypercarbic respiratory failure d/t COPD exacerbation  w/ marked Upper airway component/ Laryngopharyngeal Reflux Disease. This is further c/b severe anxiety +/- element of paranoia and cyclic cough, Can't exclude component of occult reflux.  Plan -doxycycline IV every 12 hours.  -Cont scheduled BDs -Add flutter  -Add add PPI BID - reflux precautions - add zyrtec  -cont tramadol for cough - add low dose Risperdal   -may need esophagram -F/u influenza PCR/respiratory viral panel PCR. -Continuing Xanax by mouth when necessary. -think that she might benefit from going to [facility] at some-point for vocal cord clinic -  Erick Colace ACNP-BC [place] Pager #  [phone] OR # [phone] if no answer

## 2016-10-01 LAB — GLUCOSE, CAPILLARY
Glucose-Capillary: 116 mg/dL — ABNORMAL HIGH (ref 65–99)
Glucose-Capillary: 127 mg/dL — ABNORMAL HIGH (ref 65–99)
Glucose-Capillary: 139 mg/dL — ABNORMAL HIGH (ref 65–99)
Glucose-Capillary: 165 mg/dL — ABNORMAL HIGH (ref 65–99)
Glucose-Capillary: 167 mg/dL — ABNORMAL HIGH (ref 65–99)
Glucose-Capillary: 241 mg/dL — ABNORMAL HIGH (ref 65–99)
Glucose-Capillary: 97 mg/dL (ref 65–99)

## 2016-10-01 MED ORDER — POTASSIUM CHLORIDE CRYS ER 20 MEQ PO TBCR
40.0000 meq | EXTENDED_RELEASE_TABLET | Freq: Once | ORAL | Status: AC
  Administered 2016-10-01: 40 meq via ORAL
  Filled 2016-10-01: qty 2

## 2016-10-01 MED ORDER — HYDROCOD POLST-CPM POLST ER 10-8 MG/5ML PO SUER
5.0000 mL | Freq: Once | ORAL | Status: AC
  Administered 2016-10-01: 5 mL via ORAL
  Filled 2016-10-01: qty 5

## 2016-10-01 MED ORDER — FUROSEMIDE 10 MG/ML IJ SOLN
20.0000 mg | Freq: Once | INTRAMUSCULAR | Status: AC
  Administered 2016-10-01: 20 mg via INTRAVENOUS
  Filled 2016-10-01: qty 2

## 2016-10-01 MED ORDER — DOXYCYCLINE HYCLATE 100 MG PO TABS
100.0000 mg | ORAL_TABLET | Freq: Two times a day (BID) | ORAL | Status: DC
  Administered 2016-10-01 – 2016-10-02 (×2): 100 mg via ORAL
  Filled 2016-10-01 (×2): qty 1

## 2016-10-01 MED ORDER — DM-GUAIFENESIN ER 30-600 MG PO TB12
1.0000 | ORAL_TABLET | Freq: Two times a day (BID) | ORAL | Status: DC | PRN
  Administered 2016-10-01: 1 via ORAL
  Filled 2016-10-01: qty 1

## 2016-10-01 MED ORDER — INSULIN ASPART 100 UNIT/ML ~~LOC~~ SOLN
0.0000 [IU] | Freq: Three times a day (TID) | SUBCUTANEOUS | Status: DC
  Administered 2016-10-01: 2 [IU] via SUBCUTANEOUS

## 2016-10-01 MED ORDER — BENZONATATE 100 MG PO CAPS
200.0000 mg | ORAL_CAPSULE | Freq: Three times a day (TID) | ORAL | Status: DC | PRN
  Administered 2016-10-02: 200 mg via ORAL
  Filled 2016-10-01: qty 2

## 2016-10-01 MED ORDER — DM-GUAIFENESIN ER 30-600 MG PO TB12: 1.0000 | ORAL_TABLET | Freq: Two times a day (BID) | ORAL | Status: DC

## 2016-10-01 MED ORDER — PREDNISONE 50 MG PO TABS
50.0000 mg | ORAL_TABLET | Freq: Every day | ORAL | Status: DC
  Administered 2016-10-02: 50 mg via ORAL
  Filled 2016-10-01: qty 1

## 2016-10-01 MED ORDER — INSULIN ASPART 100 UNIT/ML ~~LOC~~ SOLN
0.0000 [IU] | Freq: Every day | SUBCUTANEOUS | Status: DC
  Administered 2016-10-01: 2 [IU] via SUBCUTANEOUS

## 2016-10-01 NOTE — Progress Notes (Signed)
PHARMACIST - PHYSICIAN COMMUNICATION DR:   Carolin Sicks CONCERNING: Antibiotic IV to Oral Route Change Policy  RECOMMENDATION: This patient is receiving Doxycycline by the intravenous route.  Based on criteria approved by the Pharmacy and Therapeutics Committee, the antibiotic(s) is/are being converted to the equivalent oral dose form(s).   DESCRIPTION: These criteria include:  Patient being treated for a respiratory tract infection, urinary tract infection, cellulitis or clostridium difficile associated diarrhea if on metronidazole  The patient is not neutropenic and does not exhibit a GI malabsorption state  The patient is eating (either orally or via tube) and/or has been taking other orally administered medications for a least 24 hours  The patient is improving clinically and has a Tmax < 100.5  If you have questions about this conversion, please contact the Pharmacy Department  '[]'$   [phone] )  [facility] '[]'$   [phone] )  [facility] '[]'$   [phone] )  [facility] '[]'$   [phone] )  [facility] '[x]'$   [phone] )  [place], PharmD, BCPS 10/01/2016, 10:14 AM  Pager: [phone]

## 2016-10-01 NOTE — Progress Notes (Signed)
PROGRESS NOTE    [name]  RXV:[id number] DOB: 03/20/1946 DOA: 09/28/2016 PCP: [name], MD    Brief Narrative: 70 y.o. female with Gold II COPD followed by Dr. [name] presents to the ER with complaints of increasing shortness of breath for 24 hours. Treating for COPD exacerbation and hypoxa. Requiring Bipap. Pulmonary following.   Assessment & Plan:   # COPD with acute exacerbation ([place]) likely due to rhinovirus/enterovirus infection: RSV positive. Patient has significant clinical improvement today. She was weaned off BiPAP yesterday and able to wean off oxygen today. She is currently on room year. She still has diffuse expiratory bilateral wheezing. Switch  IV Solu-Medrol to oral prednisone today. Pulmonary consult appreciated, lying for coronary function test. Continue bronchodilators and breathing treatment. I will transfer her today to telemetry floor. -On doxycycline for anti-inflammatory properties.    #Acute hypoxic and hypercarbic respiratory failure in the setting of COPD exacerbation: Continue management as able. Clinically improved.  #Anxiety: Patient was very anxious and restless this morning. On xanax prn.  -risperidol added by pulm today.  # Essential hypertension: The latest blood pressure is acceptable. Continue to monitor closely.. On bisoprolol. Hydralazine as needed    I discussed with the patient and her nurse at bedside. Continue other current medication and supportive care.  DVT prophylaxis: Lovenox subcutaneous Code Status: Full code Family Communication: No family present at bedside Disposition Plan: The discharge home in 1-2 days  Consultants: Pulmonologist  Procedures: BipAp off today. Antimicrobials:doxycycline.  Subjective: The patient was seen and examined at bedside. She is off BiPAP and oxygen this morning. Still has dry cough but denies shortness of breath, chest pain. No nausea vomiting or abdominal pain. Objective: Vitals:   10/01/16 0746 10/01/16 0800 10/01/16 0900 10/01/16 1103  BP:    134/72  Pulse: 94 90 97 79  Resp: 18 (!) 31 (!) 22 20  Temp:  98.2 F (36.8 C)  98.2 F (36.8 C)  TempSrc:  Oral  Oral  SpO2: 100% 100% 94% 95%  Weight:      Height:        Intake/Output Summary (Last 24 hours) at 10/01/16 1315 Last data filed at 10/01/16 0920  Gross per 24 hour  Intake              890 ml  Output             1400 ml  Net             -510 ml   Filed Weights   09/28/16 0918 09/28/16 2130  Weight: 81.2 kg (179 lb) 82.2 kg (181 lb 3.5 oz)    Examination:  General exam: Looks comfortable, Respiratory system: Mild diffuse bilateral expiratory wheeze, no crackle. Lung sounds better than yesterday. Cardiovascular system: Regular rate rhythm, S1-S2 normal. No pedal edema.. Gastrointestinal system: Abdomen soft, nontender and nondistended. Bowel sounds positive. Central nervous system: Alert, awake and following commands. No focal neurological deficit. Extremities: Symmetric 5 x 5 power. Skin: No rashes, lesions or ulcers   Data Reviewed: I have personally reviewed following labs and imaging studies  CBC:  Recent Labs Lab 09/28/16 1015 09/29/16 0323 09/30/16 0321  WBC 9.9 8.9 14.7*  NEUTROABS 7.5  --  13.1*  HGB 13.7 12.7 13.2  HCT 40.5 37.7 39.2  MCV 90.0 88.9 89.3  PLT 335 309 509   Basic Metabolic Panel:  Recent Labs Lab 09/28/16 1015 09/29/16 0323 09/30/16 0321  NA 144 138 141  K 3.6 3.7  4.7  CL 108 107 109  CO2 '26 25 26  '$ GLUCOSE 174* 189* 123*  BUN 5* 8 18  CREATININE 0.74 0.52 0.60  CALCIUM 9.1 9.1 9.5  MG  --   --  2.4  PHOS  --   --  2.8   GFR: Estimated Creatinine Clearance: 72.8 mL/min (by C-G formula based on SCr of 0.6 mg/dL). Liver Function Tests:  Recent Labs Lab 09/29/16 0323 09/30/16 0321  AST 43*  --   ALT 21  --   ALKPHOS 44  --   BILITOT 0.5  --   PROT 7.4  --   ALBUMIN 3.8 3.9   No results for input(s): LIPASE, AMYLASE in the last 168  hours. No results for input(s): AMMONIA in the last 168 hours. Coagulation Profile: No results for input(s): INR, PROTIME in the last 168 hours. Cardiac Enzymes:  Recent Labs Lab 09/28/16 1015  TROPONINI <0.03   BNP (last 3 results) No results for input(s): PROBNP in the last 8760 hours. HbA1C: No results for input(s): HGBA1C in the last 72 hours. CBG:  Recent Labs Lab 09/30/16 1945 10/01/16 0009 10/01/16 0416 10/01/16 0815 10/01/16 1150  GLUCAP 154* 167* 97 116* 165*   Lipid Profile: No results for input(s): CHOL, HDL, LDLCALC, TRIG, CHOLHDL, LDLDIRECT in the last 72 hours. Thyroid Function Tests: No results for input(s): TSH, T4TOTAL, FREET4, T3FREE, THYROIDAB in the last 72 hours. Anemia Panel: No results for input(s): VITAMINB12, FOLATE, FERRITIN, TIBC, IRON, RETICCTPCT in the last 72 hours. Sepsis Labs: No results for input(s): PROCALCITON, LATICACIDVEN in the last 168 hours.  Recent Results (from the past 240 hour(s))  MRSA PCR Screening     Status: Abnormal   Collection Time: 09/28/16  9:17 PM  Result Value Ref Range Status   MRSA by PCR INVALID RESULTS, SPECIMEN SENT FOR CULTURE (A) NEGATIVE Final    Comment: S ODOM AT 8250 ON 10.01.2017 BY NBROOKS        The GeneXpert MRSA Assay (FDA approved for NASAL specimens only), is one component of a comprehensive MRSA colonization surveillance program. It is not intended to diagnose MRSA infection nor to guide or monitor treatment for MRSA infections.   MRSA PCR Screening     Status: None   Collection Time: 09/29/16  5:02 AM  Result Value Ref Range Status   MRSA by PCR NEGATIVE NEGATIVE Final    Comment:        The GeneXpert MRSA Assay (FDA approved for NASAL specimens only), is one component of a comprehensive MRSA colonization surveillance program. It is not intended to diagnose MRSA infection nor to guide or monitor treatment for MRSA infections.   Respiratory Panel by PCR     Status: Abnormal    Collection Time: 09/29/16  9:50 AM  Result Value Ref Range Status   Adenovirus NOT DETECTED NOT DETECTED Final   Coronavirus 229E NOT DETECTED NOT DETECTED Final   Coronavirus HKU1 NOT DETECTED NOT DETECTED Final   Coronavirus NL63 NOT DETECTED NOT DETECTED Final   Coronavirus OC43 NOT DETECTED NOT DETECTED Final   Metapneumovirus NOT DETECTED NOT DETECTED Final   Rhinovirus / Enterovirus DETECTED (A) NOT DETECTED Final   Influenza A NOT DETECTED NOT DETECTED Final   Influenza B NOT DETECTED NOT DETECTED Final   Parainfluenza Virus 1 NOT DETECTED NOT DETECTED Final   Parainfluenza Virus 2 NOT DETECTED NOT DETECTED Final   Parainfluenza Virus 3 NOT DETECTED NOT DETECTED Final   Parainfluenza Virus 4 NOT  DETECTED NOT DETECTED Final   Respiratory Syncytial Virus NOT DETECTED NOT DETECTED Final   Bordetella pertussis NOT DETECTED NOT DETECTED Final   Chlamydophila pneumoniae NOT DETECTED NOT DETECTED Final   Mycoplasma pneumoniae NOT DETECTED NOT DETECTED Final    Comment: Performed at [facility]         Radiology Studies: No results found.      Scheduled Meds: . aspirin  81 mg Oral Daily  . bisoprolol  2.5 mg Oral Daily  . cetirizine HCl  5 mg Oral Daily  . doxycycline  100 mg Oral Q12H  . enoxaparin (LOVENOX) injection  40 mg Subcutaneous Q24H  . famotidine  20 mg Oral QHS  . insulin aspart  0-15 Units Subcutaneous Q4H  . ipratropium-albuterol  3 mL Nebulization QID  . mouth rinse  15 mL Mouth Rinse BID  . montelukast  10 mg Oral QHS  . pantoprazole  40 mg Oral BID AC  . [START ON 10/02/2016] predniSONE  50 mg Oral Q breakfast  . risperiDONE  0.5 mg Oral BID  . rosuvastatin  20 mg Oral QPM   Continuous Infusions:     LOS: 3 days    Time spent: 25 minutes   Dron Tanna Furry, MD [facility] Pager [phone]  If 7PM-7AM, please contact night-coverage [email or web address] Password [email or web address] 10/01/2016, 1:15 PM

## 2016-10-01 NOTE — Progress Notes (Signed)
   Name: [name] MRN: [id number] DOB: 02/26/1946    ADMISSION DATE:  09/28/2016 CONSULTATION DATE:  09/29/2016  REFERRING MD :  Sharla Kidney, M.D. / [facility]  CHIEF COMPLAINT:  Acute Respiratory Failure  SIGNIFICANT EVENTS  9/30 - Admit to floor then transfer to ICU w/ respiratory distress requiring BiPAP  STUDIES:  PFT 03/17/14: FVC 1.88 L (69%) FEV1 1.23 L (58%) FEV1/FVC 0.66 negative bronchodilator response TLC 85% RV 119% DLCO uncorrected 56% PORT CXR 9/30:  Personally reviewed by me. Hyperinflation w/o focal opacity.   MICROBIOLOGY: MRSA PCR 10/1 >> neg  ANTIBIOTICS: Doxycycline 9/30 >>  LINES/TUBES: PIV x1   SUBJECTIVE: Transferring out of ICU, in good spirits  VITAL SIGNS: Temp:  [97.2 F (36.2 C)-98.3 F (36.8 C)] 98.2 F (36.8 C) (10/03 0800) Pulse Rate:  [77-103] 97 (10/03 0900) Resp:  [17-37] 22 (10/03 0900) BP: (131-162)/(56-80) 146/74 (10/03 0406) SpO2:  [93 %-100 %] 94 % (10/03 0900) [place] PHYSICAL EXAMINATION: General:  Awake. Alert. No family at bedside. very calm and excited to be transferring to floor.Marland Kitchen HEENT:   NCAT. No JVD. Faint upper airway wheeze  Cardiovascular:  Mildly tachycardic with regular rhythm. No edema. No appreciable JVD.  Pulmonary:  Good aeration in lung bases with wheezing t/o (seems to be radiating from Upper airway) Abdomen: Soft. Normal bowel sounds. Nondistended. Grossly nontender. Musculoskeletal:  Normal bulk and tone. Hand grip strength 5/5 bilaterally. No joint deformity or effusion appreciated. Neurological:  Moving all 4 extremities equally. Following commands. No meningismus. Psychiatric:  Mood and affect appear congruent. Unable to completely assess given BiPAP requirement.   Recent Labs Lab 09/28/16 1015 09/29/16 0323 09/30/16 0321  NA 144 138 141  K 3.6 3.7 4.7  CL 108 107 109  CO2 '26 25 26  '$ BUN 5* 8 18  CREATININE 0.74 0.52 0.60  GLUCOSE 174* 189* 123*    Recent Labs Lab 09/28/16 1015 09/29/16 0323  09/30/16 0321  HGB 13.7 12.7 13.2  HCT 40.5 37.7 39.2  WBC 9.9 8.9 14.7*  PLT 335 309 335   No results found.  ASSESSMENT / PLAN:  Acute hypoxic and hypercarbic respiratory failure AECOPD LPR flare Severe anxiety  Cyclic cough Severe anxiety Paranoia  Occult reflux   Pulmonary Acute hypoxic respiratory failure: Acute hypercarbic respiratory failure d/t COPD exacerbation  w/ marked Upper airway component/ Laryngopharyngeal Reflux Disease. This is further c/b severe anxiety +/- element of paranoia and cyclic cough, Can't exclude component of occult reflux.  Plan -doxycycline IV every 12 hours.  -Cont scheduled BDs -Continue  flutter valve - Continue add PPI BID - reflux precautions - add zyrtec  -cont tramadol for cough - add low dose Risperdal   -may need esophagram -F/u influenza PCR/respiratory viral panel PCR. -Continuing Xanax by mouth when necessary. -think that she might benefit from going to [facility] at some-point for vocal cord clinic -10/3 Transferring to tele floor per [facility]. Will need follow up pulmonary appointment upon discharge   Magdalen Spatz, [facility] Pager # [phone]

## 2016-10-01 NOTE — Progress Notes (Signed)
Prior to ambulating in the hall, pulse ox on room air 95% and HR 96. End pulse ox 92% and HR 105. Pulse ox did not drop below 92% and HR did not go above 109.   K. Hardin Negus RN

## 2016-10-02 ENCOUNTER — Inpatient Hospital Stay (HOSPITAL_COMMUNITY): Payer: Medicare Other

## 2016-10-02 DIAGNOSIS — J4541 Moderate persistent asthma with (acute) exacerbation: Secondary | ICD-10-CM

## 2016-10-02 DIAGNOSIS — J45901 Unspecified asthma with (acute) exacerbation: Secondary | ICD-10-CM

## 2016-10-02 DIAGNOSIS — B348 Other viral infections of unspecified site: Secondary | ICD-10-CM

## 2016-10-02 LAB — PULMONARY FUNCTION TEST
DL/VA % pred: 50 %
DL/VA: 2.53 ml/min/mmHg/L
DLCO cor % pred: 23 %
DLCO cor: 6.22 ml/min/mmHg
DLCO unc % pred: 22 %
DLCO unc: 6.18 ml/min/mmHg
FEF 25-75 Pre: 0.3 L/sec
FEF2575-%Pred-Pre: 16 %
FEV1-%Pred-Pre: 34 %
FEV1-Pre: 0.7 L
FEV1FVC-%Pred-Pre: 67 %
FEV6-%Pred-Pre: 50 %
FEV6-Pre: 1.27 L
FEV6FVC-%Pred-Pre: 100 %
FVC-%Pred-Pre: 50 %
FVC-Pre: 1.32 L
Pre FEV1/FVC ratio: 53 %
Pre FEV6/FVC Ratio: 96 %

## 2016-10-02 LAB — GLUCOSE, CAPILLARY
Glucose-Capillary: 104 mg/dL — ABNORMAL HIGH (ref 65–99)
Glucose-Capillary: 181 mg/dL — ABNORMAL HIGH (ref 65–99)

## 2016-10-02 MED ORDER — CETIRIZINE HCL 5 MG/5ML PO SYRP: 5.0000 mg | ORAL_SOLUTION | Freq: Every day | ORAL | 0 refills | Status: DC

## 2016-10-02 MED ORDER — PREDNISONE 10 MG PO TABS: ORAL_TABLET | ORAL | 0 refills | Status: DC

## 2016-10-02 MED ORDER — MOMETASONE FURO-FORMOTEROL FUM 200-5 MCG/ACT IN AERO
2.0000 | INHALATION_SPRAY | Freq: Two times a day (BID) | RESPIRATORY_TRACT | Status: DC
  Filled 2016-10-02: qty 8.8

## 2016-10-02 MED ORDER — SENNOSIDES-DOCUSATE SODIUM 8.6-50 MG PO TABS: 1.0000 | ORAL_TABLET | Freq: Every evening | ORAL | 0 refills | Status: DC | PRN

## 2016-10-02 MED ORDER — DOXYCYCLINE HYCLATE 100 MG PO TABS: 100.0000 mg | ORAL_TABLET | Freq: Two times a day (BID) | ORAL | 0 refills | Status: AC

## 2016-10-02 MED ORDER — MONTELUKAST SODIUM 10 MG PO TABS: 10.0000 mg | ORAL_TABLET | Freq: Every day | ORAL | Status: DC

## 2016-10-02 MED ORDER — FLUTICASONE PROPIONATE 50 MCG/ACT NA SUSP
2.0000 | Freq: Every day | NASAL | Status: DC
  Filled 2016-10-02: qty 16

## 2016-10-02 MED ORDER — BENZONATATE 200 MG PO CAPS: 200.0000 mg | ORAL_CAPSULE | Freq: Three times a day (TID) | ORAL | 0 refills | Status: DC | PRN

## 2016-10-02 MED ORDER — FLUTICASONE PROPIONATE 50 MCG/ACT NA SUSP: 2.0000 | Freq: Every day | NASAL | 0 refills | Status: DC

## 2016-10-02 NOTE — Progress Notes (Signed)
Name: [name] MRN: [id number] DOB: January 12, 1946    ADMISSION DATE:  09/28/2016 CONSULTATION DATE:  09/29/2016  REFERRING MD :  Sharla Kidney, M.D. / [facility]  CHIEF COMPLAINT:  Acute Respiratory Failure  STUDIES 2015  - mod obstrn with reduced dlco 56%, normal IgE, normal blood allergy profile normal ACE, consistnetly normal eos since 2008 - 2017, Normal CT chest 2015, Normal CT sincue feb 2016 PFT 03/17/14: FVC 1.88 L (69%) FEV1 1.23 L (58%) FEV1/FVC 0.66 negative bronchodilator response TLC 85% RV 119% DLCO uncorrected 56%    ANTIBIOTICS: Anti-infectives    Start     Dose/Rate Route Frequency Ordered Stop   10/01/16 2200  doxycycline (VIBRA-TABS) tablet 100 mg     100 mg Oral Every 12 hours 10/01/16 1015     09/29/16 1000  doxycycline (VIBRAMYCIN) 100 mg in dextrose 5 % 250 mL IVPB  Status:  Discontinued     100 mg 125 mL/hr over 120 Minutes Intravenous Every 12 hours 09/29/16 0835 10/01/16 1015   09/28/16 1530  doxycycline (VIBRA-TABS) tablet 100 mg  Status:  Discontinued     100 mg Oral Every 12 hours 09/28/16 1520 09/29/16 0835       SIGNIFICANT EVENTS  9/30 - Admit to floor then transfer to ICU w/ respiratory distress requiring BiPAP 9/30:  Personally reviewed by me. Hyperinflation w/o focal opacity.  09/29/16  - RHINOVIRUS POSITIVE.Marland Kitchen MRSA PCR negative 10/01/16 - Transferring out of ICU, in good spirits. Lasix given   SUBJECTIVE/OVERNIGHT/INTERVAL HX:  10/02/16 - ambulated in hallway yesterday on Room air - did not desaturate below 92%. Spirometry pending. HPI reports AECOPD but she says smoked very littled and was told of throat issues by Dr [name] PFT ins 2015 did show obstrn with low dlco. Med reivew show asthma Rx with dulera/singulair.  Wants to go home   reports that she quit smoking about 10 years ago. Her smoking use included Cigarettes. She has a 10.50 pack-year smoking history. She has never used smokeless tobacco.      VITAL SIGNS: Temp:  [97.9 F (36.6  C)-98.2 F (36.8 C)] 97.9 F (36.6 C) (10/04 0525) Pulse Rate:  [79-90] 80 (10/04 0525) Resp:  [17-20] 17 (10/04 0525) BP: (132-144)/(67-73) 144/73 (10/04 0525) SpO2:  [94 %-96 %] 94 % (10/04 0525) [place] PHYSICAL EXAMINATION: General:  Awake. Alert. No family at bedside. very calm and excited to be transferring to floor.Marland Kitchen HEENT:   NCAT. No JVD. Faint upper airway wheeze  Cardiovascular:  Mildly tachycardic with regular rhythm. No edema. No appreciable JVD.  Pulmonary:  Good aeration in lung bases with wheezing t/o (seems to be radiating from Upper airway) Abdomen: Soft. Normal bowel sounds. Nondistended. Grossly nontender. Musculoskeletal:  Normal bulk and tone. Hand grip strength 5/5 bilaterally. No joint deformity or effusion appreciated. Neurological:  Moving all 4 extremities equally. Following commands. No meningismus. Psychiatric:  Mood and affect appear congruent. Unable to completely assess given BiPAP requirement.    PULMONARY No results for input(s): PHART, PCO2ART, PO2ART, HCO3, TCO2, O2SAT in the last 168 hours.  Invalid input(s): PCO2, PO2  CBC  Recent Labs Lab 09/28/16 1015 09/29/16 0323 09/30/16 0321  HGB 13.7 12.7 13.2  HCT 40.5 37.7 39.2  WBC 9.9 8.9 14.7*  PLT 335 309 335    COAGULATION No results for input(s): INR in the last 168 hours.  CARDIAC   Recent Labs Lab 09/28/16 1015  TROPONINI <0.03   No results for input(s): PROBNP in the last 168  hours.   CHEMISTRY  Recent Labs Lab 09/28/16 1015 09/29/16 0323 09/30/16 0321  NA 144 138 141  K 3.6 3.7 4.7  CL 108 107 109  CO2 '26 25 26  '$ GLUCOSE 174* 189* 123*  BUN 5* 8 18  CREATININE 0.74 0.52 0.60  CALCIUM 9.1 9.1 9.5  MG  --   --  2.4  PHOS  --   --  2.8   Estimated Creatinine Clearance: 71.8 mL/min (by C-G formula based on SCr of 0.6 mg/dL).   LIVER  Recent Labs Lab 09/29/16 0323 09/30/16 0321  AST 43*  --   ALT 21  --   ALKPHOS 44  --   BILITOT 0.5  --   PROT 7.4  --     ALBUMIN 3.8 3.9     INFECTIOUS No results for input(s): LATICACIDVEN, PROCALCITON in the last 168 hours.   ENDOCRINE CBG (last 3)   Recent Labs  10/01/16 1753 10/01/16 2216 10/02/16 0746  GLUCAP 127* 241* 104*         IMAGING x48h  - image(s) personally visualized  -   highlighted in bold No results found.     ASSESSMENT / PLAN:  Acute hypoxic and hypercarbic respiratory failure due to   - AE-asthma (doubt COPD) due to rhinovirus and LPR flare and anxiety  And Occult reflux    .  Plan -doxycyclin po every 12 hours.  -total 5 day Rx adquate - TRH to do stop date -DC  scheduled BDs - back to dulera -  continue singulair  - add flonase  - continue oral antihistamines  - pred taper over 12 days from 10/01/16 - TRH to write out  - PPI - reflux precautions -dc  tramadol for cough - dc Risperdal   -may need esophagram - opd decision by [place] [facility] --Continuing Xanax by mouth when necessary. - if not a home med can be stopped at discharge [facility] to decide)  -think that she might benefit from going to [facility] at some-point for vocal cord clinic - can be set up by opd Pulm clinic   Can go home today - it [birth year] [age] - ideally get spirometry before dc but if not possible ok for dc from PCCM standpoint   Future Appointments Date Time Provider [place]  10/07/2016 11:45 AM Tammy Jeralene Huff, NP [facility] None     PCCM will sign off    Dr. Brand Males, M.D., [facility].C.P Pulmonary and Critical Care Medicine Staff Physician [place] [facility] Pager: [phone], If no answer or between  15:00h - 7:00h: call [phone]  10/02/2016 9:48 AM

## 2016-10-02 NOTE — Discharge Summary (Signed)
Discharge Summary  [name] QAS:[id number] DOB: July 22, 1946  PCP: [name], MD  Admit date: 09/28/2016 Discharge date: 10/02/2016   Recommendations for Outpatient Follow-up:  1. PCP 1-2 weeks 2. Pulmonary 2 weeks   Discharge Diagnoses:  Active Hospital Problems   Diagnosis Date Noted  . COPD with acute exacerbation ([facility]) 09/28/2016  . Rhinovirus infection 10/02/2016  . Acute asthma exacerbation 10/02/2016  . Acute respiratory failure with hypoxia and hypercapnia ([facility])   . Hypoxia 09/28/2016  . SOB (shortness of breath) on exertion 09/28/2016  . Acute exacerbation of chronic obstructive pulmonary disease (COPD) ([facility]) 09/28/2016  . Essential hypertension 11/03/2014  . Cough 10/19/2014  . COPD GOLD II  03/21/2014  . Rhinitis, nonallergic 03/21/2014    Resolved Hospital Problems   Diagnosis Date Noted Date Resolved  No resolved problems to display.    Discharge Condition: Stable   Diet recommendation: Regular   Vitals:   10/01/16 2212 10/02/16 0525  BP: 132/67 (!) 144/73  Pulse: 90 80  Resp: 18 17  Temp: 98 F (36.7 C) 97.9 F (36.6 C)    HPI and Brief Hospital Course:  70 y.o.femalewith Gold II COPD followed by Dr. [name] presents to the ER with complaints of increasing shortness of breath for 24 hours. Treating for COPD exacerbation and hypoxa. Requiring Bipap. Pulmonary following. She has improved with breathing treatments, supplemental O2, antibiotics. Ready for d/c home today on room air and will have PFT's done today prior to discharge.   Discharge details, plan of care and follow up instructions were discussed with patient and any available family or care providers. Patient and family are in agreement with discharge from the hospital today and all questions were answered to their satisfaction.  Details of Hospital Course:  # COPD with acute exacerbation ([facility]) likely due to rhinovirus/enterovirus infection: RSV positive. Patient has significant  clinical improvement now and ready to go home. She was weaned off BiPAP 10/2 and able to wean off oxygen 10/3. She is currently on room airr. She still has diffuse expiratory wheezing likely upper airway. Switched IV Solu-Medrol to oral prednisone 10/3 still doing well and plan home today to complete 12 day taper per Pulmonary consult.     #Acute hypoxic and hypercarbic respiratory failure in the setting of COPD exacerbation: Continue management as outpatient as above.   #Anxiety: Patient was very anxious and restless this morning. On xanax prn.  -risperidol added by pulm as well.  # Essential hypertension: The latest blood pressure is acceptable. Continue to monitor closely.. On bisoprolol. Hydralazine as needed  Procedures:  None   Consultations:  Pulmonary   Discharge Exam: BP (!) 144/73 (BP Location: Left Arm)   Pulse 80   Temp 97.9 F (36.6 C) (Oral)   Resp 17   Ht '5\' 6"'$  (1.676 m)   Wt 82.2 kg (181 lb 3.5 oz)   SpO2 94%   BMI 29.25 kg/m  General:  Alert, oriented, calm, in no acute distress, speaking full sentences, ambulating on room air Eyes: EOMI, clear sclerea Neck: supple, no masses, trachea mildline  Cardiovascular: RRR, no murmurs or rubs, no peripheral edema  Respiratory: clear to auscultation bilaterally, upper airway exp wheezes, no crackles  Abdomen: soft, nontender, nondistended, normal bowel tones heard  Skin: dry, no rashes  Musculoskeletal: no joint effusions, normal range of motion  Psychiatric: appropriate affect, normal speech  Neurologic: extraocular muscles intact, clear speech, moving all extremities with intact sensorium    Discharge Instructions You were cared  for by a hospitalist during your hospital stay. If you have any questions about your discharge medications or the care you received while you were in the hospital after you are discharged, you can call the unit and asked to speak with the hospitalist on call if the hospitalist that took  care of you is not available. Once you are discharged, your primary care physician will handle any further medical issues. Please note that NO REFILLS for any discharge medications will be authorized once you are discharged, as it is imperative that you return to your primary care physician (or establish a relationship with a primary care physician if you do not have one) for your aftercare needs so that they can reassess your need for medications and monitor your lab values.  Discharge Instructions    Call MD for:  difficulty breathing, headache or visual disturbances    Complete by:  As directed    Call MD for:  persistant nausea and vomiting    Complete by:  As directed    Call MD for:  severe uncontrolled pain    Complete by:  As directed    Call MD for:  temperature >100.4    Complete by:  As directed    Diet - low sodium heart healthy    Complete by:  As directed    Increase activity slowly    Complete by:  As directed        Medication List    STOP taking these medications   clotrimazole 10 MG troche Commonly known as:  MYCELEX     TAKE these medications   aspirin 81 MG tablet Take 81 mg by mouth daily.   benzonatate 200 MG capsule Commonly known as:  TESSALON Take 1 capsule (200 mg total) by mouth 3 (three) times daily as needed for cough.   bisoprolol 5 MG tablet Commonly known as:  ZEBETA TAKE 1/2 TAB BY MOUTH DAILY What changed:  See the new instructions.   CALCIUM PO Take 1 tablet by mouth daily.   cetirizine HCl 5 MG/5ML Syrp Commonly known as:  Zyrtec Take 5 mLs (5 mg total) by mouth daily.   doxycycline 100 MG tablet Commonly known as:  VIBRA-TABS Take 1 tablet (100 mg total) by mouth every 12 (twelve) hours.   famotidine 20 MG tablet Commonly known as:  PEPCID Take 1 tablet (20 mg total) by mouth at bedtime.   fluticasone 50 MCG/ACT nasal spray Commonly known as:  FLONASE Place 2 sprays into both nostrils daily.   Hydrocodone-Acetaminophen  5-300 MG Tabs TAKE 1 TABLET BY MOUTH EVERY 6 TO 8 HOURS AS NEEDED FOR PAIN   LYRICA 75 MG capsule Generic drug:  pregabalin Take 75 mg by mouth daily.   meloxicam 15 MG tablet Commonly known as:  MOBIC Take 15 mg by mouth daily.   mometasone-formoterol 200-5 MCG/ACT Aero Commonly known as:  DULERA Take 2 puffs first thing in am and then another 2 puffs about 12 hours later. What changed:  how much to take  how to take this  when to take this  additional instructions   montelukast 10 MG tablet Commonly known as:  SINGULAIR TAKE 1 TABLET (10 MG TOTAL) BY MOUTH AT BEDTIME. What changed:  how much to take  how to take this  when to take this  additional instructions   montelukast 10 MG tablet Commonly known as:  SINGULAIR TAKE 1 TABLET (10 MG TOTAL) BY MOUTH AT BEDTIME. What changed:  Another  medication with the same name was changed. Make sure you understand how and when to take each.   predniSONE 10 MG tablet Commonly known as:  DELTASONE Take '50mg'$  PO daily for 4 days, then '30mg'$  PO daily for 4 days, then '20mg'$  PO daily for 4 days then stop. Start taking on:  10/03/2016 What changed:  additional instructions   rosuvastatin 20 MG tablet Commonly known as:  CRESTOR Take 20 mg by mouth every evening.   senna-docusate 8.6-50 MG tablet Commonly known as:  Senokot-S Take 1 tablet by mouth at bedtime as needed for mild constipation.   traMADol 50 MG tablet Commonly known as:  ULTRAM Take 1 tablet (50 mg total) by mouth every 6 (six) hours as needed. What changed:  reasons to take this   VICTOZA 18 MG/3ML Sopn Generic drug:  Liraglutide Inject 18 mg into the skin daily.   Vitamin D3 2000 units Tabs Take 2,000 Units by mouth daily.      No Known Allergies Follow-up Information    RAMASWAMY,MURALI, MD. Schedule an appointment as soon as possible for a visit in 2 week(s).   Specialty:  Pulmonary Disease Contact information: [place]  [id number] [phone]        [name], MD Follow up in 2 week(s).   Specialty:  Internal Medicine Contact information: [address] [place] [address] [phone]            The results of significant diagnostics from this hospitalization (including imaging, microbiology, ancillary and laboratory) are listed below for reference.    Significant Diagnostic Studies: Dg Chest Port 1 View  Result Date: 09/28/2016 CLINICAL DATA:  Shortness burr, chest tightness, cough and congestion. EXAM: PORTABLE CHEST 1 VIEW COMPARISON:  Chest x-rays dated 01/30/2016 in 09/2020 2015. FINDINGS: Heart size is normal. Overall cardiomediastinal silhouette is stable in size and configuration. Atherosclerotic changes noted at the aortic arch. Lungs are hyperexpanded suggesting COPD. Lungs appear clear. Osseous structures about the chest are unremarkable. IMPRESSION: 1. No active disease.  No evidence of pneumonia seen. 2. Hyperexpanded lungs suggesting COPD. 3. Aortic atherosclerosis. Electronically Signed   By: Franki Cabot M.D.   On: 09/28/2016 11:41    Microbiology: Recent Results (from the past 240 hour(s))  MRSA PCR Screening     Status: Abnormal   Collection Time: 09/28/16  9:17 PM  Result Value Ref Range Status   MRSA by PCR INVALID RESULTS, SPECIMEN SENT FOR CULTURE (A) NEGATIVE Final    Comment: S ODOM AT 4709 ON 10.01.2017 BY NBROOKS        The GeneXpert MRSA Assay (FDA approved for NASAL specimens only), is one component of a comprehensive MRSA colonization surveillance program. It is not intended to diagnose MRSA infection nor to guide or monitor treatment for MRSA infections.   MRSA PCR Screening     Status: None   Collection Time: 09/29/16  5:02 AM  Result Value Ref Range Status   MRSA by PCR NEGATIVE NEGATIVE Final    Comment:        The GeneXpert MRSA Assay (FDA approved for NASAL specimens only), is one component of a comprehensive MRSA  colonization surveillance program. It is not intended to diagnose MRSA infection nor to guide or monitor treatment for MRSA infections.   Respiratory Panel by PCR     Status: Abnormal   Collection Time: 09/29/16  9:50 AM  Result Value Ref Range Status   Adenovirus NOT DETECTED NOT DETECTED Final  Coronavirus 229E NOT DETECTED NOT DETECTED Final   Coronavirus HKU1 NOT DETECTED NOT DETECTED Final   Coronavirus NL63 NOT DETECTED NOT DETECTED Final   Coronavirus OC43 NOT DETECTED NOT DETECTED Final   Metapneumovirus NOT DETECTED NOT DETECTED Final   Rhinovirus / Enterovirus DETECTED (A) NOT DETECTED Final   Influenza A NOT DETECTED NOT DETECTED Final   Influenza B NOT DETECTED NOT DETECTED Final   Parainfluenza Virus 1 NOT DETECTED NOT DETECTED Final   Parainfluenza Virus 2 NOT DETECTED NOT DETECTED Final   Parainfluenza Virus 3 NOT DETECTED NOT DETECTED Final   Parainfluenza Virus 4 NOT DETECTED NOT DETECTED Final   Respiratory Syncytial Virus NOT DETECTED NOT DETECTED Final   Bordetella pertussis NOT DETECTED NOT DETECTED Final   Chlamydophila pneumoniae NOT DETECTED NOT DETECTED Final   Mycoplasma pneumoniae NOT DETECTED NOT DETECTED Final    Comment: Performed at [facility]: Basic Metabolic Panel:  Recent Labs Lab 09/28/16 1015 09/29/16 0323 09/30/16 0321  NA 144 138 141  K 3.6 3.7 4.7  CL 108 107 109  CO2 '26 25 26  '$ GLUCOSE 174* 189* 123*  BUN 5* 8 18  CREATININE 0.74 0.52 0.60  CALCIUM 9.1 9.1 9.5  MG  --   --  2.4  PHOS  --   --  2.8   Liver Function Tests:  Recent Labs Lab 09/29/16 0323 09/30/16 0321  AST 43*  --   ALT 21  --   ALKPHOS 44  --   BILITOT 0.5  --   PROT 7.4  --   ALBUMIN 3.8 3.9   No results for input(s): LIPASE, AMYLASE in the last 168 hours. No results for input(s): AMMONIA in the last 168 hours. CBC:  Recent Labs Lab 09/28/16 1015 09/29/16 0323 09/30/16 0321  WBC 9.9 8.9 14.7*  NEUTROABS 7.5  --  13.1*   HGB 13.7 12.7 13.2  HCT 40.5 37.7 39.2  MCV 90.0 88.9 89.3  PLT 335 309 335   Cardiac Enzymes:  Recent Labs Lab 09/28/16 1015  TROPONINI <0.03   BNP: BNP (last 3 results) No results for input(s): BNP in the last 8760 hours.  ProBNP (last 3 results) No results for input(s): PROBNP in the last 8760 hours.  CBG:  Recent Labs Lab 10/01/16 1556 10/01/16 1753 10/01/16 2216 10/02/16 0746 10/02/16 1201  GLUCAP 139* 127* 241* 104* 181*    Time spent: 42 minutes were spent in preparing this discharge including medication reconciliation, counseling, and coordination of care.  Signed:  Ahilyn Nell Marry Guan, MD  [facility] 10/02/2016, 1:33 PM

## 2016-10-02 NOTE — Care Management Important Message (Signed)
Important Message  Patient Details  Name: [name] MRN: [id number] Date of Birth: 1946/01/04   Medicare Important Message Given:  Yes    Camillo Flaming 10/02/2016, 11:55 AMImportant Message  Patient Details  Name: [name] MRN: [id number] Date of Birth: 02/13/1946   Medicare Important Message Given:  Yes    Camillo Flaming 10/02/2016, 11:55 AM

## 2016-10-07 ENCOUNTER — Inpatient Hospital Stay: Payer: Medicare Other | Admitting: Adult Health

## 2016-10-23 ENCOUNTER — Encounter: Payer: Self-pay | Admitting: Internal Medicine

## 2016-10-23 ENCOUNTER — Ambulatory Visit (INDEPENDENT_AMBULATORY_CARE_PROVIDER_SITE_OTHER): Payer: Medicare Other | Admitting: Internal Medicine

## 2016-10-23 VITALS — BP 122/72 | HR 83 | Ht 66.0 in | Wt 190.2 lb

## 2016-10-23 DIAGNOSIS — J449 Chronic obstructive pulmonary disease, unspecified: Secondary | ICD-10-CM | POA: Diagnosis not present

## 2016-10-23 DIAGNOSIS — R05 Cough: Secondary | ICD-10-CM | POA: Diagnosis not present

## 2016-10-23 DIAGNOSIS — R059 Cough, unspecified: Secondary | ICD-10-CM

## 2016-10-23 NOTE — Patient Instructions (Addendum)
For cough/ urge to clear throat > continue lyrica 75 mg up to twice daily    As long as coughing and needing cough meds strongly rec:  Try prilosec otc '20mg'$   Take 30-60 min before first meal of the day and continue Pepcid ac (famotidine) 20 mg one @  bedtime until cough is completely gone for at least a week without the need for cough suppression  Take delsym two tsp every 12 hours as needed  GERD (REFLUX)  is an extremely common cause of respiratory symptoms just like yours , many times with no obvious heartburn at all.    It can be treated with medication, but also with lifestyle changes including elevation of the head of your bed (ideally with 6 inch  bed blocks),  Smoking cessation, avoidance of late meals, excessive alcohol, and avoid fatty foods, chocolate, peppermint, colas, red wine, and acidic juices such as orange juice.  NO MINT OR MENTHOL PRODUCTS SO NO COUGH DROPS  USE SUGARLESS CANDY INSTEAD (Jolley ranchers or Stover's or Life Savers) or even ice chips will also do - the key is to swallow to prevent all throat clearing. NO OIL BASED VITAMINS - use powdered substitutes.  Marland Kitchen

## 2016-10-23 NOTE — Progress Notes (Signed)
Subjective:    Patient ID: [name], female    DOB: 1946-07-15    MRN: [id number]    Brief patient profile:  47 yobf mother of a PA in [employer] quit smoking 2007 with chronic cough x 2011 self referred for evaluation of persistent cough in setting of technically GOLD II copd dx 02/2014 with only mild obstructed pattern - has noted since high school a tendency to tickle in throat causing intermittent cough waxing and waning ever since    History of Present Illness  10/19/2014 1st [facility] office visit/ Hara Milholland   Chief Complaint  Patient presents with  . Pulmonary Consult    Former pt of Dr. Annamaria Boots. Pt c/o increased cough x 3 wks. Cough is prod with moderate, thick, light yellow sputum.  She coughs until loses urinary continence.  Not sleeping well due to cough.   remembers having tendency to throat tickle in high sschool  but completely resolved by graduation  then recurred in her 55s and resolved with codeine then recurred in 2011  but bad to worse x 3 weeks assoc with sensation of choking, mucus is thick light yellow.  acei stopped about a month prior to OV  Replaced by losartan  Inhalers make it worse, esp dpi    Kouffman Reflux v Neurogenic Cough Differentiator Reflux Comments  Do you awaken from a sound sleep coughing violently?                            With trouble breathing? Yes   Do you have choking episodes when you cannot  Get enough air, gasping for air ?              Yes   Do you usually cough when you lie down into  The bed, or when you just lie down to rest ?                          Yes   Do you usually cough after meals or eating?         maybe   Do you cough when (or after) you bend over?    No    GERD SCORE     Kouffman Reflux v Neurogenic Cough Differentiator Neurogenic   Do you more-or-less cough all day long? yes   Does change of temperature make you cough? no   Does laughing or chuckling cause you to cough? no   Do fumes (perfume, automobile fumes,  burned  Toast, etc.,) cause you to cough ?      Not aware   Does speaking, singing, or talking on the phone cause you to cough   ?               sometimes   Neurogenic/Airway score       rec  schedule sinus CT > neg  First take delsym two tsp every 12 hours and supplement if needed with  tramadol 50 mg up to 2 every 4 hours to suppress the urge to cough at all or even clear your throat.  Prednisone 10 mg take  4 each am x 2 days,   2 each am x 2 days,  1 each am x 2 days and stop (this is to eliminate allergies and inflammation from coughing) Protonix (pantoprazole) Take 30-60 min before first meal of the day and Pepcid 20 mg one bedtime plus chlorpheniramine  4 mg x 2 at bedtime (both available over the counter)    11/03/2014 f/u ov/Florencio Hollibaugh re: cough x 5 weeks/ Mild COPD  Chief Complaint  Patient presents with  . Follow-up    Pt states that cough is some better.  Still coughing up minimal white, foamy sputum.  No new co's today.    >off ACE and rx bystolic   16/96/78 Follow up for Cough /Mild COPD  Pt returns for 2 week follow up and med review .  We reviewed all her medications organize them into a medication count with patient education Appears that she is taking her medications correctly Last visit. She was stopped off of her ACE inhibitor due to cough She was given Bystolic for blood pressure control Blood pressure is well-controlled today. She is tolerating medication well She returns without any significant change in her shortness of breath or cough. Chest x-ray showed no acute process, CT sinus showed no acute sinusitis. rec Add Allergy Relief (Chlortrimeton 4mg  ) 2 At bedtime   May use 1 extra allergy relief every 4hr as needed for throat clearing .  Continue with cough control with delsym and tramadol.  Continue on Bystolic 5mg  daily   11/30/2014 f/u ov/Chidinma Clites re: chronic cough  Chief Complaint  Patient presents with  . Acute Visit    Pt states that her cough is no better  since last visit here 11/18/14. She also c/o frequent throat clearing and "rattling in throat". She states that there have been 3 occasions since last visit where she woke up from sleeping with cough and "felt like choking".   Overall pattern has not improved on rx unless includes tramadol  though not clear that she's been able to take the complex regimen  We've recommended and has not yet started the prednisone that was recommended over the phone  rec Start dulera 100 Take 2 puffs first thing in am and then another 2 puffs about 12 hours later.  Work on inhaler technique:  Get the prednisone right away For cough > mucinex dm up to 1200 mg twice daily and supplement with tramadol 50 mg up to 2 every 4 hours if needed  Change neurontin to 100 three times daily     01/03/2015 f/u ov/[name] re: cough since 2011/ GOLD II COPD / tickle since childhood  Chief Complaint  Patient presents with  . Follow-up    cough has resolved; no concerns today   prednisone may have helped  This time / used it with tramadol but no more tramadol x weeks and no flare  May have caught cold xsev days but so far just controlling cough with candy/ ? Better on dulera > note sure, may have caused choking  Stopped acid suppression/  dulera but still on neurontin 100 tid. rec Stay on neurontin (gabapentin) three times a day indefinitely  At first sign of flare > Pantoprazole (protonix) 40 mg   Take 30-60 min before first meal of the day and Pepcid 20 mg one bedtime until gone and return if needed If all else fails and you are having trouble breathing try dulera 100 up to 2 every 12 hours  F/u is prn   04/07/2015 f/u ov/[name] re: recurrent cough/ wheeze  Chief Complaint  Patient presents with  . Acute Visit    Pt states cough started back approx 1 month ago- occ prod with minimal, foamy, white sputum.  She also c/o SOB and chest tightness when she has a coughing fit.  onset of flare was gradual/ pattern is intermittent  daytime but wakes up in am now wheezing / coughing but not usually prematurely    Never stopped the gerd rx and maintained on singulair / did not try the dulera when symptoms flared rec Start dulera 100 sample Take 2 puffs first thing in am and then another 2 puffs about 12 hours later.  Take delsym two tsp every 12 hours and supplement if needed with  tramadol 50 mg up to 2 every 4 hours to suppress the urge to cough  If not improving > Prednisone 10 mg take  4 each am x 2 days,   2 each am x 2 days,  1 each am x 2 days and stop  Please schedule a follow up office visit in 2 weeks, sooner if needed  Add consider d/c singulair p allergy season      04/21/2015 f/u ov/[name] re: chronic cough/ likely multifactorial better on dulera 100 2bid Chief Complaint  Patient presents with  . Follow-up    Pt states that chest tightness and SOB have resolved. Her cough is much better. No new co's today.    No longer needing any tramadol/ never took the prednisone  Main issue is excess drowsiness on 1st gen h1  rec Stop chloretrimeton and take zyrtec 10 mg at bedtime as needed for itching/ sneezing/runny nose, post nasal drainage - good x 24 hours  Continue dulera 100 Take 2 puffs first thing in am and then another 2 puffs about 12 hours later.     06/01/2015 f/u ov/[name] re: GOLD II copd/ asthmatic bronchitis on dulera 100 2bid and singulair  Chief Complaint  Patient presents with  . Follow-up    Pt states her breathing is doing well. No new co's today.    Now on lyrica instead of gabapentin/ minimal cough mostly daytime, esp early in am/ Not limited by breathing from desired activities  / no need for saba or cough suppression  >>cont on dulera   09/08/2015 Follow up : COPD -GOLD II /Asthma Bronchitis  Pt presents for an acute office visit.  Complains over the last week she has had increased cough , congestion and wheezing .  Grandkids have been sick .  No otc used.  Remains on Dulera .  rec Zpack  to have on hold if symptoms worsen with discolored mucus .  Mucinex DM Twice daily  As needed   Prednisone taper over next week.     01/30/2016   Acute ext ov/[name] re: recurrent cough/ sob Chief Complaint  Patient presents with  . Acute Visit    Pt c/o cough, fever and SOB x 3 days. Cough is prod with white sputum. She feels achy all over.    had been much better until acute illness 3 d prior to OV  While maint on dulera 200 and no need for saba/tramadol Comfortable at rest except for cough / fever but no rigors  rec zpak  Add prednisone if needed (if not getting better) Clotrimazole lozenge up 4 x daily  Start back on lyrica until cough is gone As long as coughing and needing cough meds strongly rec:  Try prilosec otc '20mg'$   Take 30-60 min before first meal of the day and Pepcid ac (famotidine) 20 mg one @  bedtime until cough is completely gone for at least a week without the need for cough suppression Take delsym two tsp every 12 hours and supplement if needed with  tramadol  50 mg up to 2 every 4 hours  >> states cough  Resolved     Admit date: 09/28/2016 Discharge date: 10/02/2016    Discharge Diagnoses:       Active Hospital Problems   Diagnosis Date Noted  . COPD with acute exacerbation ([place]) 09/28/2016  . Rhinovirus infection 10/02/2016  . Acute asthma exacerbation 10/02/2016  . Acute respiratory failure with hypoxia and hypercapnia ([facility])   . Hypoxia 09/28/2016  . SOB (shortness of breath) on exertion 09/28/2016  . Acute exacerbation of chronic obstructive pulmonary disease (COPD) ([place]) 09/28/2016  . Essential hypertension 11/03/2014  . Cough 10/19/2014  . COPD GOLD II  03/21/2014  . Rhinitis, nonallergic            10/23/2016  f/u ov/[name] re:  Chief Complaint  Patient presents with  . Hospitalization Follow-up    Breathing back to normal baseline. She has minimal cough and wheezing at night. Cough is occ prod with light yellow sputum.   during summer  2017 doing well on dulera 200 2bid and singulair and pepcid at hs prior to acute worsening   No obvious day to day or daytime variability or assoc  mucus plugs or hemoptysis or cp or chest tightness,   overt sinus or hb symptoms. No unusual exp hx or h/o childhood pna/ asthma or knowledge of premature birth.  Sleeping ok without nocturnal  or early am exacerbation  of respiratory  c/o's or need for noct saba. Also denies any obvious fluctuation of symptoms with weather or environmental changes or other aggravating or alleviating factors except as outlined above   Current Medications, Allergies, Complete Past Medical History, Past Surgical History, Family History, and Social History were reviewed in [employer] record.  ROS  The following are not active complaints unless bolded sore throat, dysphagia, dental problems, itching, sneezing,  nasal congestion or excess/ purulent secretions, ear ache,   fever, chills, sweats, unintended wt loss, classically pleuritic or exertional cp,  orthopnea pnd or leg swelling, presyncope, palpitations, abdominal pain, anorexia, nausea, vomiting, diarrhea  or change in bowel or bladder habits, change in stools or urine, dysuria,hematuria,  rash, arthralgias, visual complaints, headache, numbness, weakness or ataxia or problems with walking or coordination,  change in mood/affect or memory.                      Objective:   Physical Exam  amb bf nad still occ throat clearing   04/07/2015 207  > 04/21/2015 208 > 06/01/2015  210>211 09/08/2015 > 01/30/2016   184 > 10/23/2016  190    Vital signs reviewed  - Note on arrival 02 sats  97% on RA    HEENT: nl dentition, turbinates, and orophanx. Nl external ear canals without cough reflex   NECK :  without JVD/Nodes/TM/ nl carotid upstrokes bilaterally   LUNGS: no acc muscle use clear to A and P bilaterally   CV:  RRR  no s3 or murmur or increase in P2, no edema   ABD:  soft and nontender with  nl excursion in the supine position. No bruits or organomegaly, bowel sounds nl  MS:  warm without deformities, calf tenderness, cyanosis or clubbing  SKIN: warm and dry without lesions    NEURO:  alert, approp, no deficits        CXR PA and Lateral:   01/30/2016 :    I personally reviewed images and agree with radiology impression as follows:  No active lung disease. Persistent scarring at the left costophrenic angle. Mild peribronchial thickening.          Assessment & Plan:

## 2016-10-27 ENCOUNTER — Encounter: Payer: Self-pay | Admitting: Internal Medicine

## 2016-10-27 NOTE — Assessment & Plan Note (Signed)
-   allergy profile 03/10/14  >  IgE 18 with neg RAST - sinus ct 10/25/2014 > Clear sinuses. - try off cozar 11/03/2014 >>> - neurontin 100 tid rx 11/30/14 > improved 01/03/2015 > changed to lyrica 04/2015 but not taking consistently as of 01/30/2016  - sinus CT 02/17/15 > neg   Reviewed regimen that was previously effective fro cyclical cough including use of lyrica - see avs

## 2016-10-27 NOTE — Assessment & Plan Note (Signed)
Quit smoking 2007 PFT: 03/17/2014-  FEV1  1.28 (58%) ratio 66 and no better p saba, ERV 27 and DLCO 56 corrects to 77 - 11/30/2014   try dulera 100 2bid > 01/03/2015 not clear helping,  use prn > flare since mid 02/2015 cough > sob  - 04/07/2015 p extensive coaching HFA effectiveness =    90% > try dulera 100 2bid > improved 04/21/15   - 06/01/2015 p extensive coaching HFA effectiveness =    90% > try dulera 200 2bid     Back to baseline p flare ? Related to uri  I had an extended discussion with the patient reviewing all relevant studies completed to date and  lasting 15 to 20 minutes of a 25 minute visit    Each maintenance medication was reviewed in detail including most importantly the difference between maintenance and prns and under what circumstances the prns are to be triggered using an action plan format that is not reflected in the computer generated alphabetically organized AVS.    Please see instructions for details which were reviewed in writing and the patient given a copy highlighting the part that I personally wrote and discussed at today's ov.

## 2016-11-11 ENCOUNTER — Telehealth: Payer: Self-pay | Admitting: Internal Medicine

## 2016-11-11 NOTE — Telephone Encounter (Signed)
Called spoke with patient who reported she received Rx for Tessalon '200mg'$  TID prn from her most recent hospitalization and this worked very well for her cough.  She would like to know if MW will refill this for her.  Dr [name] please advise, thank you.

## 2016-11-12 MED ORDER — BENZONATATE 200 MG PO CAPS: 200.0000 mg | ORAL_CAPSULE | Freq: Three times a day (TID) | ORAL | 5 refills | Status: DC | PRN

## 2016-11-12 NOTE — Telephone Encounter (Signed)
That's fine- give her 85 with 5 refills

## 2016-11-12 NOTE — Telephone Encounter (Signed)
Spoke with pt. She is aware that we will send in this prescription. Nothing further was needed.

## 2016-11-20 ENCOUNTER — Other Ambulatory Visit: Payer: Self-pay

## 2016-11-20 MED ORDER — MONTELUKAST SODIUM 10 MG PO TABS: ORAL_TABLET | ORAL | 1 refills | Status: DC

## 2016-11-29 ENCOUNTER — Encounter: Payer: Self-pay | Admitting: Internal Medicine

## 2016-11-29 NOTE — Telephone Encounter (Signed)
Hard to know the answer but these are good questions Needs ov with me with all meds in hand to regroup

## 2016-11-29 NOTE — Telephone Encounter (Signed)
MW  Please Advise- Please see below pt. email   Hello.For the past month or so, I have been blowing my nose a lot.My nose is always runny with lots of mucous. It takes 2 or more tissues to clear it. My nose runs much, much more than I cough. The cough and clearing of throat is not too bad, but when I do cough, I always spit up mucous. Why do I have so much mucous inside me.It's has gotten to the point where I am very concerned and very embarrassed to be in public.   Is this expelling of mucous or phlegm a good thing, bad thing, or of no concern?Also there's been traces of blood when I blow my nose. The last time, there was a lot of blood. The roof of my mouth always feels slimy. I've tried Musinex and it leaves me with a dry cough which always makes me gag and throw up so I don't want that. What say you?My call back number is [phone]. Thank you for your help.

## 2016-12-04 ENCOUNTER — Encounter: Payer: Self-pay | Admitting: Adult Health

## 2016-12-04 ENCOUNTER — Ambulatory Visit (INDEPENDENT_AMBULATORY_CARE_PROVIDER_SITE_OTHER): Payer: Medicare Other | Admitting: Adult Health

## 2016-12-04 DIAGNOSIS — J310 Chronic rhinitis: Secondary | ICD-10-CM

## 2016-12-04 MED ORDER — AZELASTINE-FLUTICASONE 137-50 MCG/ACT NA SUSP: 1.0000 | Freq: Every day | NASAL | 3 refills | Status: DC | PRN

## 2016-12-04 MED ORDER — AZELASTINE-FLUTICASONE 137-50 MCG/ACT NA SUSP: 1.0000 | Freq: Every day | NASAL | 0 refills | Status: DC | PRN

## 2016-12-04 NOTE — Addendum Note (Signed)
Addended by: Parke Poisson E on: 12/04/2016 11:47 AM   Modules accepted: Orders

## 2016-12-04 NOTE — Progress Notes (Signed)
Subjective:    Patient ID: [name], female    DOB: 02/19/1946    MRN: [id number]    Brief patient profile:  58 yobf mother of a PA in [employer] quit smoking 2007 with chronic cough x 2011 self referred for evaluation of persistent cough in setting of technically GOLD II copd dx 02/2014 with only mild obstructed pattern - has noted since high school a tendency to tickle in throat causing intermittent cough waxing and waning ever since    History of Present Illness  10/19/2014 1st [facility] office visit/ [facility]   Chief Complaint  Patient presents with  . Pulmonary Consult    Former pt of Dr. Annamaria Boots. Pt c/o increased cough x 3 wks. Cough is prod with moderate, thick, light yellow sputum.  She coughs until loses urinary continence.  Not sleeping well due to cough.   remembers having tendency to throat tickle in high sschool  but completely resolved by graduation  then recurred in her 85s and resolved with codeine then recurred in 2011  but bad to worse x 3 weeks assoc with sensation of choking, mucus is thick light yellow.  acei stopped about a month prior to OV  Replaced by losartan  Inhalers make it worse, esp dpi    Kouffman Reflux v Neurogenic Cough Differentiator Reflux Comments  Do you awaken from a sound sleep coughing violently?                            With trouble breathing? Yes   Do you have choking episodes when you cannot  Get enough air, gasping for air ?              Yes   Do you usually cough when you lie down into  The bed, or when you just lie down to rest ?                          Yes   Do you usually cough after meals or eating?         maybe   Do you cough when (or after) you bend over?    No    GERD SCORE     Kouffman Reflux v Neurogenic Cough Differentiator Neurogenic   Do you more-or-less cough all day long? yes   Does change of temperature make you cough? no   Does laughing or chuckling cause you to cough? no   Do fumes (perfume, automobile fumes,  burned  Toast, etc.,) cause you to cough ?      Not aware   Does speaking, singing, or talking on the phone cause you to cough   ?               sometimes   Neurogenic/Airway score       rec  schedule sinus CT > neg  First take delsym two tsp every 12 hours and supplement if needed with  tramadol 50 mg up to 2 every 4 hours to suppress the urge to cough at all or even clear your throat.  Prednisone 10 mg take  4 each am x 2 days,   2 each am x 2 days,  1 each am x 2 days and stop (this is to eliminate allergies and inflammation from coughing) Protonix (pantoprazole) Take 30-60 min before first meal of the day and Pepcid 20 mg one bedtime plus chlorpheniramine  4 mg x 2 at bedtime (both available over the counter)    11/03/2014 f/u ov/Wert re: cough x 5 weeks/ Mild COPD  Chief Complaint  Patient presents with  . Follow-up    Pt states that cough is some better.  Still coughing up minimal white, foamy sputum.  No new co's today.    >off ACE and rx bystolic   16/96/78 Follow up for Cough /Mild COPD  Pt returns for 2 week follow up and med review .  We reviewed all her medications organize them into a medication count with patient education Appears that she is taking her medications correctly Last visit. She was stopped off of her ACE inhibitor due to cough She was given Bystolic for blood pressure control Blood pressure is well-controlled today. She is tolerating medication well She returns without any significant change in her shortness of breath or cough. Chest x-ray showed no acute process, CT sinus showed no acute sinusitis. rec Add Allergy Relief (Chlortrimeton 4mg  ) 2 At bedtime   May use 1 extra allergy relief every 4hr as needed for throat clearing .  Continue with cough control with delsym and tramadol.  Continue on Bystolic 5mg  daily   11/30/2014 f/u ov/Wert re: chronic cough  Chief Complaint  Patient presents with  . Acute Visit    Pt states that her cough is no better  since last visit here 11/18/14. She also c/o frequent throat clearing and "rattling in throat". She states that there have been 3 occasions since last visit where she woke up from sleeping with cough and "felt like choking".   Overall pattern has not improved on rx unless includes tramadol  though not clear that she's been able to take the complex regimen  We've recommended and has not yet started the prednisone that was recommended over the phone  rec Start dulera 100 Take 2 puffs first thing in am and then another 2 puffs about 12 hours later.  Work on inhaler technique:  Get the prednisone right away For cough > mucinex dm up to 1200 mg twice daily and supplement with tramadol 50 mg up to 2 every 4 hours if needed  Change neurontin to 100 three times daily     01/03/2015 f/u ov/Wert re: cough since 2011/ GOLD II COPD / tickle since childhood  Chief Complaint  Patient presents with  . Follow-up    cough has resolved; no concerns today   prednisone may have helped  This time / used it with tramadol but no more tramadol x weeks and no flare  May have caught cold xsev days but so far just controlling cough with candy/ ? Better on dulera > note sure, may have caused choking  Stopped acid suppression/  dulera but still on neurontin 100 tid. rec Stay on neurontin (gabapentin) three times a day indefinitely  At first sign of flare > Pantoprazole (protonix) 40 mg   Take 30-60 min before first meal of the day and Pepcid 20 mg one bedtime until gone and return if needed If all else fails and you are having trouble breathing try dulera 100 up to 2 every 12 hours  F/u is prn   04/07/2015 f/u ov/Wert re: recurrent cough/ wheeze  Chief Complaint  Patient presents with  . Acute Visit    Pt states cough started back approx 1 month ago- occ prod with minimal, foamy, white sputum.  She also c/o SOB and chest tightness when she has a coughing fit.  onset of flare was gradual/ pattern is intermittent  daytime but wakes up in am now wheezing / coughing but not usually prematurely    Never stopped the gerd rx and maintained on singulair / did not try the dulera when symptoms flared rec Start dulera 100 sample Take 2 puffs first thing in am and then another 2 puffs about 12 hours later.  Take delsym two tsp every 12 hours and supplement if needed with  tramadol 50 mg up to 2 every 4 hours to suppress the urge to cough  If not improving > Prednisone 10 mg take  4 each am x 2 days,   2 each am x 2 days,  1 each am x 2 days and stop  Please schedule a follow up office visit in 2 weeks, sooner if needed  Add consider d/c singulair p allergy season      04/21/2015 f/u ov/Wert re: chronic cough/ likely multifactorial better on dulera 100 2bid Chief Complaint  Patient presents with  . Follow-up    Pt states that chest tightness and SOB have resolved. Her cough is much better. No new co's today.    No longer needing any tramadol/ never took the prednisone  Main issue is excess drowsiness on 1st gen h1  rec Stop chloretrimeton and take zyrtec 10 mg at bedtime as needed for itching/ sneezing/runny nose, post nasal drainage - good x 24 hours  Continue dulera 100 Take 2 puffs first thing in am and then another 2 puffs about 12 hours later.     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 Follow-up    Pt states her breathing is doing well. No new co's today.    Now on lyrica instead of gabapentin/ minimal cough mostly daytime, esp early in am/ Not limited by breathing from desired activities  / no need for saba or cough suppression  >>cont on dulera   09/08/2015 Follow up : COPD -GOLD II /Asthma Bronchitis  Pt presents for an acute office visit.  Complains over the last week she has had increased cough , congestion and wheezing .  Grandkids have been sick .  No otc used.  Remains on Dulera .  rec Zpack  to have on hold if symptoms worsen with discolored mucus .  Mucinex DM Twice daily  As needed   Prednisone taper over next week.     01/30/2016   Acute ext ov/Wert re: recurrent cough/ sob Chief Complaint  Patient presents with  . Acute Visit    Pt c/o cough, fever and SOB x 3 days. Cough is prod with white sputum. She feels achy all over.    had been much better until acute illness 3 d prior to OV  While maint on dulera 200 and no need for saba/tramadol Comfortable at rest except for cough / fever but no rigors  rec zpak  Add prednisone if needed (if not getting better) Clotrimazole lozenge up 4 x daily  Start back on lyrica until cough is gone As long as coughing and needing cough meds strongly rec:  Try prilosec otc '20mg'$   Take 30-60 min before first meal of the day and Pepcid ac (famotidine) 20 mg one @  bedtime until cough is completely gone for at least a week without the need for cough suppression Take delsym two tsp every 12 hours and supplement if needed with  tramadol  50 mg up to 2 every 4 hours  >> states cough  Resolved     Admit date: 09/28/2016 Discharge date: 10/02/2016    Discharge Diagnoses:       Active Hospital Problems   Diagnosis Date Noted  . COPD with acute exacerbation ([place]) 09/28/2016  . Rhinovirus infection 10/02/2016  . Acute asthma exacerbation 10/02/2016  . Acute respiratory failure with hypoxia and hypercapnia ([facility])   . Hypoxia 09/28/2016  . SOB (shortness of breath) on exertion 09/28/2016  . Acute exacerbation of chronic obstructive pulmonary disease (COPD) ([place]) 09/28/2016  . Essential hypertension 11/03/2014  . Cough 10/19/2014  . COPD GOLD II  03/21/2014  . Rhinitis, nonallergic            10/23/2016  f/u ov/Wert re:  Chief Complaint  Patient presents with  . Hospitalization Follow-up    Breathing back to normal baseline. She has minimal cough and wheezing at night. Cough is occ prod with light yellow sputum.   during summer  2017 doing well on dulera 200 2bid and singulair and pepcid at hs prior to acute worsening  >>lyrica   12/04/2016 Acute OV : sinus congestion /COPD GOLD II  Pt presents for 3-4 week of nasal congestion, stuffiness, drainage,  No sinus pain or pressure . Clear /white discharge. No discolored mucus.  Has had some bloody discharge.  Remains on Dulera Twice daily  And Singulair .  Cough is not as bad.  Leaving for cruise next week  Denies chest pain, orthopnea, edema or fever.      Current Medications, Allergies, Complete Past Medical History, Past Surgical History, Family History, and Social History were reviewed in [employer] record.  ROS  The following are not active complaints unless bolded sore throat, dysphagia, dental problems, itching, sneezing,  nasal congestion or excess/ purulent secretions, ear ache,   fever, chills, sweats, unintended wt loss, classically pleuritic or exertional cp,  orthopnea pnd or leg swelling, presyncope, palpitations, abdominal pain, anorexia, nausea, vomiting, diarrhea  or change in bowel or bladder habits, change in stools or urine, dysuria,hematuria,  rash, arthralgias, visual complaints, headache, numbness, weakness or ataxia or problems with walking or coordination,  change in mood/affect or memory.                      Objective:   Physical Exam  amb bf nad still occ throat clearing   04/07/2015 207  > 04/21/2015 208 > 06/01/2015  210>211 09/08/2015 > 01/30/2016   184 > 10/23/2016  190   Vitals:   12/04/16 1105  BP: 122/72  Pulse: 89  SpO2: 100%  Weight: 186 lb 6.4 oz (84.6 kg)  Height: '5\' 5"'$  (1.651 m)      Vital signs reviewed  - Note on arrival 02 sats  100% on RA    HEENT: nl dentition,  and orophanx. Nl external ear canals without cough reflex Nonspecific turnbinate edema , non tender sinus.   NECK :  without JVD/Nodes/TM/ nl carotid upstrokes bilaterally   LUNGS: no acc muscle use clear to A and P bilaterally     CV:  RRR  no s3 or murmur or increase in P2, no edema   ABD:  soft and nontender with nl excursion in the supine position. No bruits or organomegaly, bowel sounds nl  MS:  warm without deformities, calf tenderness, cyanosis or clubbing  SKIN: warm and dry without lesions    NEURO:  alert, approp, no deficits  CXR PA and Lateral:   01/30/2016 :     No active lung disease. Persistent scarring at the left costophrenic angle. Mild peribronchial thickening.      Daril Warga NP-C  [facility]  12/04/2016

## 2016-12-04 NOTE — Assessment & Plan Note (Signed)
Acute rhinitis   Plan  Patient Instructions  Begin Saline nasal spray 2 puffs Twice daily  As needed  Congestion .  Begin Dymista Nasal spray 1 puff daily nose. As needed  Nasal drainage /stuffiness.  Begin Zyrtec '10mg'$  At bedtime  As needed  Drainage .  Please contact office for sooner follow up if symptoms do not improve or worsen or seek emergency care  follow up Dr. [name]  As planned and As needed

## 2016-12-04 NOTE — Patient Instructions (Signed)
Begin Saline nasal spray 2 puffs Twice daily  As needed  Congestion .  Begin Dymista Nasal spray 1 puff daily nose. As needed  Nasal drainage /stuffiness.  Begin Zyrtec '10mg'$  At bedtime  As needed  Drainage .  Please contact office for sooner follow up if symptoms do not improve or worsen or seek emergency care  follow up Dr. [name]  As planned and As needed

## 2016-12-04 NOTE — Progress Notes (Signed)
Chart and office note reviewed in detail  > agree with a/p as outlined    

## 2016-12-11 DIAGNOSIS — Z1231 Encounter for screening mammogram for malignant neoplasm of breast: Secondary | ICD-10-CM | POA: Diagnosis not present

## 2017-01-01 DIAGNOSIS — Z79899 Other long term (current) drug therapy: Secondary | ICD-10-CM | POA: Diagnosis not present

## 2017-01-01 DIAGNOSIS — E1165 Type 2 diabetes mellitus with hyperglycemia: Secondary | ICD-10-CM | POA: Diagnosis not present

## 2017-01-01 DIAGNOSIS — I10 Essential (primary) hypertension: Secondary | ICD-10-CM | POA: Diagnosis not present

## 2017-01-01 DIAGNOSIS — Z6831 Body mass index (BMI) 31.0-31.9, adult: Secondary | ICD-10-CM | POA: Diagnosis not present

## 2017-01-30 DIAGNOSIS — R062 Wheezing: Secondary | ICD-10-CM | POA: Diagnosis not present

## 2017-01-30 DIAGNOSIS — R0689 Other abnormalities of breathing: Secondary | ICD-10-CM | POA: Diagnosis not present

## 2017-01-30 DIAGNOSIS — J069 Acute upper respiratory infection, unspecified: Secondary | ICD-10-CM | POA: Diagnosis not present

## 2017-02-18 ENCOUNTER — Other Ambulatory Visit: Payer: Self-pay | Admitting: Internal Medicine

## 2017-03-04 ENCOUNTER — Ambulatory Visit: Payer: Medicare Other | Admitting: Internal Medicine

## 2017-04-07 ENCOUNTER — Encounter: Payer: Self-pay | Admitting: Internal Medicine

## 2017-04-07 NOTE — Telephone Encounter (Signed)
Spoke with and made her aware of MW below message. Pt is requesting RX for hydocan as well.  MW please advise. Thanks.

## 2017-04-07 NOTE — Telephone Encounter (Signed)
Ok to call in tramadol x 40 to use up to q4 h prn but needs ov with all meds in hand before any further refills can be done

## 2017-04-07 NOTE — Telephone Encounter (Signed)
Spoke with pt, who is requesting refills on tramadol and hydocan for constant prod cough with yellow mucus. Pt states cough return 4-5d ago and she also experiencing some wheezing. Pt had some left over hydocan, but ran out on Friday.  MW please advise. Thanks.  12/04/16    Return in about 3 months (around 03/04/2017) for Follow up with Dr. [name].  Begin Saline nasal spray 2 puffs Twice daily  As needed  Congestion .  Begin Dymista Nasal spray 1 puff daily nose. As needed  Nasal drainage /stuffiness.  Begin Zyrtec '10mg'$  At bedtime  As needed  Drainage .  Please contact office for sooner follow up if symptoms do not improve or worsen or seek emergency care  follow up Dr. [name]  As planned and As needed

## 2017-04-08 ENCOUNTER — Telehealth: Payer: Self-pay | Admitting: Internal Medicine

## 2017-04-08 ENCOUNTER — Encounter: Payer: Self-pay | Admitting: Internal Medicine

## 2017-04-08 MED ORDER — TRAMADOL HCL 50 MG PO TABS: 50.0000 mg | ORAL_TABLET | ORAL | 0 refills | Status: DC | PRN

## 2017-04-08 NOTE — Telephone Encounter (Signed)
No only fill the tramadol until she returns and we regroup

## 2017-04-08 NOTE — Telephone Encounter (Signed)
Spoke with patient, pt has been rescheduled to to see MW on 04/14/17. Nothing further needed.  Marin Roberts, RMA  to [employer]     04/08/17 1:08 PM  Ms. [name], Dr. [name] has not approved the refill of the Hycodan cough syrup. He will discuss further when you come in for a follow up appointment. Also the tramadol has been sent to your pharmacy.   Tanda Rockers, MD  12:21 PM  Note    No only fill the tramadol until she returns and we regroup

## 2017-04-08 NOTE — Telephone Encounter (Signed)
Dr. [name] please advise if ok to fill pt's Hycodan rx, pt's Tramadol has already been called in to [facility]. Thanks.   (There is another email regarding same issue.)

## 2017-04-08 NOTE — Telephone Encounter (Signed)
Patient calling back - still waiting to hear back about the cough syrup refill - she can be reached at [phone] -pr

## 2017-04-08 NOTE — Telephone Encounter (Signed)
See other email about pt's Hycodan rx. Email has been sent to MW to address.

## 2017-04-08 NOTE — Telephone Encounter (Signed)
Patient calling back again - pt checking and Tramadol was not received yesterday by pharmacy - rx needs to [facility] [address] [facility] - pt can be reached at [phone] -pr

## 2017-04-14 ENCOUNTER — Ambulatory Visit (INDEPENDENT_AMBULATORY_CARE_PROVIDER_SITE_OTHER)
Admission: RE | Admit: 2017-04-14 | Discharge: 2017-04-14 | Disposition: A | Discharge status: Home / Self Care | Payer: Medicare Other | Source: Ambulatory Visit | Attending: Internal Medicine | Admitting: Internal Medicine

## 2017-04-14 ENCOUNTER — Ambulatory Visit (INDEPENDENT_AMBULATORY_CARE_PROVIDER_SITE_OTHER): Payer: Medicare Other | Admitting: Internal Medicine

## 2017-04-14 ENCOUNTER — Encounter: Payer: Self-pay | Admitting: Internal Medicine

## 2017-04-14 VITALS — BP 112/72 | HR 87 | Ht 65.0 in | Wt 183.0 lb

## 2017-04-14 DIAGNOSIS — J449 Chronic obstructive pulmonary disease, unspecified: Secondary | ICD-10-CM

## 2017-04-14 LAB — NITRIC OXIDE: Nitric Oxide: 14

## 2017-04-14 MED ORDER — BUDESONIDE-FORMOTEROL FUMARATE 80-4.5 MCG/ACT IN AERO: 2.0000 | INHALATION_SPRAY | Freq: Two times a day (BID) | RESPIRATORY_TRACT | 0 refills | Status: DC

## 2017-04-14 MED ORDER — PANTOPRAZOLE SODIUM 40 MG PO TBEC: 40.0000 mg | DELAYED_RELEASE_TABLET | Freq: Every day | ORAL | 2 refills | Status: DC

## 2017-04-14 MED ORDER — PREDNISONE 10 MG PO TABS: ORAL_TABLET | ORAL | 0 refills | Status: DC

## 2017-04-14 NOTE — Patient Instructions (Addendum)
Pantoprazole (protonix) 40 mg   Take  30-60 min before first meal of the day and continue  Pepcid (famotidine)  20 mg one @  bedtime until return to office    Take delsym two tsp every 12 hours and supplement if needed with  tramadol 50 mg up to 2 every 4 hours to suppress the urge to cough. Swallowing water or using ice chips/non mint and menthol containing candies (such as lifesavers or sugarless jolly ranchers) are also effective.  You should rest your voice and avoid activities that you know make you cough.  Once you have eliminated the cough for 3 straight days try reducing the tramadol first,  then the delsym as tolerated.     Prednisone 10 mg take  4 each am x 2 days,   2 each am x 2 days,  1 each am x 2 days and stop   Plan A = Automatic = symbicort 80 Take 2 puffs first thing in am and then another 2 puffs about 12 hours later.      Plan B = Backup Only use your albuterol as a rescue medication to be used if you can't catch your breath by resting or doing a relaxed purse lip breathing pattern.  - The less you use it, the better it will work when you need it. - Ok to use the inhaler up to 2 puffs  every 4 hours if you must but call for appointment if use goes up over your usual need - Don't leave home without it !!  (think of it like the spare tire for your car)    See Tammy NP w/in 2 weeks (or next available after that) with all your medications, even over the counter meds, separated in two separate bags, the ones you take no matter what vs the ones you stop once you feel better and take only as needed when you feel you need them.   Tammy  will generate for you a new user friendly medication calendar that will put [place] all on the same page re: your medication use.       Please remember to go to the  x-ray department downstairs in the basement  for your tests - we will call you with the results when they are available. Add: Augmentin 875 mg take one pill twice daily  X 10 days - take  at breakfast and supper with large glass of water.  It would help reduce the usual side effects (diarrhea and yeast infections) if you ate cultured yogurt at lunch.

## 2017-04-14 NOTE — Progress Notes (Signed)
Subjective:    Patient ID: [name], female    DOB: 12/06/1946    MRN: [id number]    Brief patient profile:  27 yobf mother of a PA in [employer] quit smoking 2007 with chronic cough x 2011 self referred for evaluation of persistent cough in setting of technically GOLD II copd dx 02/2014 with only mild obstructed pattern - has noted since high school a tendency to tickle in throat causing intermittent cough waxing and waning ever since    History of Present Illness  10/19/2014 1st [facility] office visit/ [name]   Chief Complaint  Patient presents with  . Pulmonary Consult    Former pt of Dr. Annamaria Boots. Pt c/o increased cough x 3 wks. Cough is prod with moderate, thick, light yellow sputum.  She coughs until loses urinary continence.  Not sleeping well due to cough.   remembers having tendency to throat tickle in high sschool  but completely resolved by graduation  then recurred in her 42s and resolved with codeine then recurred in 2011  but bad to worse x 3 weeks assoc with sensation of choking, mucus is thick light yellow.  acei stopped about a month prior to OV  Replaced by losartan  Inhalers make it worse, esp dpi    Kouffman Reflux v Neurogenic Cough Differentiator Reflux Comments  Do you awaken from a sound sleep coughing violently?                            With trouble breathing? Yes   Do you have choking episodes when you cannot  Get enough air, gasping for air ?              Yes   Do you usually cough when you lie down into  The bed, or when you just lie down to rest ?                          Yes   Do you usually cough after meals or eating?         maybe   Do you cough when (or after) you bend over?    No    GERD SCORE     Kouffman Reflux v Neurogenic Cough Differentiator Neurogenic   Do you more-or-less cough all day long? yes   Does change of temperature make you cough? no   Does laughing or chuckling cause you to cough? no   Do fumes (perfume, automobile fumes,  burned  Toast, etc.,) cause you to cough ?      Not aware   Does speaking, singing, or talking on the phone cause you to cough   ?               sometimes   Neurogenic/Airway score       rec  schedule sinus CT > neg  First take delsym two tsp every 12 hours and supplement if needed with  tramadol 50 mg up to 2 every 4 hours to suppress the urge to cough at all or even clear your throat.  Prednisone 10 mg take  4 each am x 2 days,   2 each am x 2 days,  1 each am x 2 days and stop (this is to eliminate allergies and inflammation from coughing) Protonix (pantoprazole) Take 30-60 min before first meal of the day and Pepcid 20 mg one bedtime plus chlorpheniramine  4 mg x 2 at bedtime (both available over the counter)    11/03/2014 f/u ov/[name] re: cough x 5 weeks/ Mild COPD  Chief Complaint  Patient presents with  . Follow-up    Pt states that cough is some better.  Still coughing up minimal white, foamy sputum.  No new co's today.    >off ACE and rx bystolic   78/29/56 Follow up for Cough /Mild COPD  Pt returns for 2 week follow up and med review .  We reviewed all her medications organize them into a medication count with patient education Appears that she is taking her medications correctly Last visit. She was stopped off of her ACE inhibitor due to cough She was given Bystolic for blood pressure control Blood pressure is well-controlled today. She is tolerating medication well She returns without any significant change in her shortness of breath or cough. Chest x-ray showed no acute process, CT sinus showed no acute sinusitis. rec Add Allergy Relief (Chlortrimeton '4mg'$  ) 2 At bedtime   May use 1 extra allergy relief every 4hr as needed for throat clearing .  Continue with cough control with delsym and tramadol.  Continue on Bystolic '5mg'$  daily   11/30/2014 f/u ov/[name] re: chronic cough  Chief Complaint  Patient presents with  . Acute Visit    Pt states that her cough is no better  since last visit here 11/18/14. She also c/o frequent throat clearing and "rattling in throat". She states that there have been 3 occasions since last visit where she woke up from sleeping with cough and "felt like choking".   Overall pattern has not improved on rx unless includes tramadol  though not clear that she's been able to take the complex regimen  We've recommended and has not yet started the prednisone that was recommended over the phone  rec Start dulera 100 Take 2 puffs first thing in am and then another 2 puffs about 12 hours later.  Work on inhaler technique:  Get the prednisone right away For cough > mucinex dm up to 1200 mg twice daily and supplement with tramadol 50 mg up to 2 every 4 hours if needed  Change neurontin to 100 three times daily     01/03/2015 f/u ov/[name] re: cough since 2011/ GOLD II COPD / tickle since childhood  Chief Complaint  Patient presents with  . Follow-up    cough has resolved; no concerns today   prednisone may have helped  This time / used it with tramadol but no more tramadol x weeks and no flare  May have caught cold x  sev days but so far just controlling cough with candy/ ? Better on dulera > note sure, may have caused choking  Stopped acid suppression/  dulera but still on neurontin 100 tid rec Stay on neurontin (gabapentin) three times a day indefinitely  At first sign of flare > Pantoprazole (protonix) 40 mg   Take 30-60 min before first meal of the day and Pepcid 20 mg one bedtime until gone and return if needed If all else fails and you are having trouble breathing try dulera 100 up to 2 every 12 hours  F/u is prn   04/07/2015 f/u ov/[name] re: recurrent cough/ wheeze  Chief Complaint  Patient presents with  . Acute Visit    Pt states cough started back approx 1 month ago- occ prod with minimal, foamy, white sputum.  She also c/o SOB and chest tightness when she has a coughing  fit.   onset of flare was gradual/ pattern is intermittent  daytime but wakes up in am now wheezing / coughing but not usually prematurely    Never stopped the gerd rx and maintained on singulair / did not try the dulera when symptoms flared rec Start dulera 100 sample Take 2 puffs first thing in am and then another 2 puffs about 12 hours later.  Take delsym two tsp every 12 hours and supplement if needed with  tramadol 50 mg up to 2 every 4 hours to suppress the urge to cough  If not improving > Prednisone 10 mg take  4 each am x 2 days,   2 each am x 2 days,  1 each am x 2 days and stop  Please schedule a follow up office visit in 2 weeks, sooner if needed  Add consider d/c singulair p allergy season      04/21/2015 f/u ov/[name] re: chronic cough/ likely multifactorial better on dulera 100 2bid Chief Complaint  Patient presents with  . Follow-up    Pt states that chest tightness and SOB have resolved. Her cough is much better. No new co's today.    No longer needing any tramadol/ never took the prednisone  Main issue is excess drowsiness on 1st gen h1  rec Stop chloretrimeton and take zyrtec 10 mg at bedtime as needed for itching/ sneezing/runny nose, post nasal drainage - good x 24 hours  Continue dulera 100 Take 2 puffs first thing in am and then another 2 puffs about 12 hours later.     06/01/2015 f/u ov/[name] re: GOLD II copd/ asthmatic bronchitis on dulera 100 2bid and singulair  Chief Complaint  Patient presents with  . Follow-up    Pt states her breathing is doing well. No new co's today.    Now on lyrica instead of gabapentin/ minimal cough mostly daytime, esp early in am/ Not limited by breathing from desired activities  / no need for saba or cough suppression  >>cont on dulera   09/08/2015 Follow up : COPD -GOLD II /Asthma Bronchitis  Pt presents for an acute office visit.  Complains over the last week she has had increased cough , congestion and wheezing .  Grandkids have been sick .  No otc used.  Remains on Dulera .  rec Zpack  to have on hold if symptoms worsen with discolored mucus .  Mucinex DM Twice daily  As needed   Prednisone taper over next week.     01/30/2016   Acute ext ov/[name] re: recurrent cough/ sob Chief Complaint  Patient presents with  . Acute Visit    Pt c/o cough, fever and SOB x 3 days. Cough is prod with white sputum. She feels achy all over.    had been much better until acute illness 3 d prior to OV  While maint on dulera 200 and no need for saba/tramadol Comfortable at rest except for cough / fever but no rigors  rec zpak  Add prednisone if needed (if not getting better) Clotrimazole lozenge up 4 x daily  Start back on lyrica until cough is gone As long as coughing and needing cough meds strongly rec:  Try prilosec otc '20mg'$   Take 30-60 min before first meal of the day and Pepcid ac (famotidine) 20 mg one @  bedtime until cough is completely gone for at least a week without the need for cough suppression Take delsym two tsp every 12 hours and supplement if needed  with  tramadol 50 mg up to 2 every 4 hours  >> states cough  Resolved     Admit date: 09/28/2016 Discharge date: 10/02/2016    Discharge Diagnoses:       Active Hospital Problems   Diagnosis Date Noted  . COPD with acute exacerbation ([place]) 09/28/2016  . Rhinovirus infection 10/02/2016  . Acute asthma exacerbation 10/02/2016  . Acute respiratory failure with hypoxia and hypercapnia ([facility])   . Hypoxia 09/28/2016  . SOB (shortness of breath) on exertion 09/28/2016  . Acute exacerbation of chronic obstructive pulmonary disease (COPD) ([place]) 09/28/2016  . Essential hypertension 11/03/2014  . Cough 10/19/2014  . COPD GOLD II  03/21/2014  . Rhinitis, nonallergic            10/23/2016  f/u ov/[name] re:  Chief Complaint  Patient presents with  . Hospitalization Follow-up    Breathing back to normal baseline. She has minimal cough and wheezing at night. Cough is occ prod with light yellow sputum.   during summer  2017 doing well on dulera 200 2bid and singulair and pepcid at hs prior to acute worsening  >>lyrica    12/04/2016 NP Acute OV : sinus congestion /COPD GOLD II  Pt presents for 3-4 week of nasal congestion, stuffiness, drainage,  No sinus pain or pressure . Clear /white discharge. No discolored mucus.  Has had some bloody discharge.  Remains on Dulera Twice daily  And Singulair .  Cough is not as bad.  Leaving for cruise next week  rec Begin Saline nasal spray 2 puffs Twice daily  As needed  Congestion .  Begin Dymista Nasal spray 1 puff daily nose. As needed  Nasal drainage /stuffiness.  Begin Zyrtec '10mg'$  At bedtime  As needed  Drainage     04/14/2017 acute extended ov/[name] re:  Copd II  On proair bid as only "maint"  Chief Complaint  Patient presents with  . Follow-up    Pt states her breathing is ok, pt is still coughing, the cough is productive with thick yellow mucus,pt is wheezing at times Denies chest tightness,fever   wheeze better p uses proair  Totally off the regimen that previously improved the cough and focused on her cough medication refills with narcotic containing meds  Had med calendar but not updating/ using  Doe = MMRC3 = can't walk 100 yards even at a slow pace at a flat grade s stopping due to sob     No obvious day to day or daytime variability or assoc  mucus plugs or hemoptysis or cp or chest tightness,   or overt sinus or hb symptoms. No unusual exp hx or h/o childhood pna/ asthma or knowledge of premature birth.  Sleeping ok without nocturnal  or early am exacerbation  of respiratory  c/o's or need for noct saba. Also denies any obvious fluctuation of symptoms with weather or environmental changes or other aggravating or alleviating factors except as outlined above   Current Medications, Allergies, Complete Past Medical History, Past Surgical History, Family History, and Social History were reviewed in [employer] record.  ROS  The  following are not active complaints unless bolded sore throat, dysphagia, dental problems, itching, sneezing,  nasal congestion or excess/ purulent secretions, ear ache,   fever, chills, sweats, unintended wt loss, classically pleuritic or exertional cp,  orthopnea pnd or leg swelling, presyncope, palpitations, abdominal pain, anorexia, nausea, vomiting, diarrhea  or change in bowel or bladder habits, change in stools or urine,  dysuria,hematuria,  rash, arthralgias, visual complaints, headache, numbness, weakness or ataxia or problems with walking or coordination,  change in mood/affect or memory.                          Objective:   Physical Exam  amb bf nad    04/07/2015 207  > 04/21/2015 208 > 06/01/2015  210>211 09/08/2015 > 01/30/2016   184 > 10/23/2016  190 > 04/14/2017   183    Vital signs reviewed  - Note on arrival 02 sats  98% on RA  HEENT: nl dentition,  and oropharynx. Nl external ear canals without cough reflex Nonspecific turnbinate edema , non tender sinus.   NECK :  without JVD/Nodes/TM/ nl carotid upstrokes bilaterally   LUNGS: no acc muscle use insp and exp rhonchi bilaterally   CV:  RRR  no s3 or murmur or increase in P2, no edema   ABD:  soft and nontender with nl excursion in the supine position. No bruits or organomegaly, bowel sounds nl  MS:  warm without deformities, calf tenderness, cyanosis or clubbing  SKIN: warm and dry without lesions    NEURO:  alert, approp, no deficits       CXR PA and Lateral:   04/14/2017 :    I personally reviewed images and agree with radiology impression as follows:   Normal heart size and normal vascularity. 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 Lungs are clear without infiltrate or effusion or mass. Mild pulmonary hyperinflation

## 2017-04-15 ENCOUNTER — Telehealth: Payer: Self-pay | Admitting: *Deleted

## 2017-04-15 MED ORDER — AMOXICILLIN-POT CLAVULANATE 875-125 MG PO TABS: 1.0000 | ORAL_TABLET | Freq: Two times a day (BID) | ORAL | 0 refills | Status: DC

## 2017-04-15 NOTE — Progress Notes (Signed)
Spoke with pt and notified of results per Dr. Wert. Pt verbalized understanding and denied any questions. 

## 2017-04-15 NOTE — Telephone Encounter (Signed)
-----   Message from Tanda Rockers, MD sent at 04/15/2017  9:50 AM EDT ----- After review of records rec  Augmentin 875 mg take one pill twice daily  X 10 days - take at breakfast and supper with large glass of water.  It would help reduce the usual side effects (diarrhea and yeast infections) if you ate cultured yogurt at lunch.

## 2017-04-15 NOTE — Assessment & Plan Note (Signed)
Quit smoking 2007 PFT: 03/17/2014-  FEV1  1.28 (58%) ratio 66 and no better p saba, ERV 27 and DLCO 56 corrects to 77 - 11/30/2014   try dulera 100 2bid > 01/03/2015 not clear helping,  use prn > flare since mid 02/2015 cough > sob  - 04/07/2015 p extensive coaching HFA effectiveness =    90% > try dulera 100 2bid > improved 04/21/15   - 06/01/2015   try dulera 200 2bid  > did not maintain  - Spirometry 04/14/2017  FEV1 1.13 (58%)  Ratio 52 p saba w/in 4 h > add symb 80 2bid   - 04/14/2017  After extensive coaching HFA effectiveness =    75%  From baseline of 50    Very poor control of symptoms/ poor insight into meds   DDX of  difficult airways management almost all start with A and  include Adherence, Ace Inhibitors, Acid Reflux, Active Sinus Disease, Alpha 1 Antitripsin deficiency, Anxiety masquerading as Airways dz,  ABPA,  Allergy(esp in young), Aspiration (esp in elderly), Adverse effects of meds,  Active smokers, A bunch of PE's (a small clot burden can't cause this syndrome unless there is already severe underlying pulm or vascular dz with poor reserve) plus two Bs  = Bronchiectasis and Beta blocker use..and one C= CHF  Adherence is always the initial "prime suspect" and is a multilayered concern that requires a "trust but verify" approach in every patient - starting with knowing how to use medications, especially inhalers, correctly, keeping up with refills and understanding the fundamental difference between maintenance and prns vs those medications only taken for a very short course and then stopped and not refilled.  - see hfa teaching - Each maintenance medication was reviewed in detail including most importantly the difference between maintenance and as needed and under what circumstances the prns are to be used. This was done in the context of a medication calendar review which provided the patient with a user-friendly unambiguous mechanism for medication administration and reconciliation and  provides an action plan for all active problems. It is critical that this be shown to every doctor  for modification during the office visit if necessary so the patient can use it as a working document.       ? Acid (or non-acid) GERD > always difficult to exclude as up to 75% of pts in some series report no assoc GI/ Heartburn symptoms> rec max (24h)  acid suppression and diet restrictions/ reviewed and instructions given in writing.   ? Allergy component > Prednisone 10 mg take  4 each am x 2 days,   2 each am x 2 days,  1 each am x 2 days and stop and continue singulair for now    ? Active sinus dz > Augmentin 875 mg take one pill twice daily  X 10 days - take at breakfast and supper with large glass of water.  It would help reduce the usual side effects (diarrhea and yeast infections) if you ate cultured yogurt at lunch. And sinus ct next ov if not better    I had an extended discussion with the patient reviewing all relevant studies completed to date and  lasting 25 minutes of a 40  minute acute office visit addressing severe     non-specific but potentially very serious refractory respiratory symptoms of unknown etiology.  Each maintenance medication was reviewed in detail including most importantly the difference between maintenance and prns and under what circumstances the  prns are to be triggered using an action plan format that is not reflected in the computer generated alphabetically organized AVS.    Please see AVS for specific instructions unique to this office visit that I personally wrote and verbalized to the the pt in detail and then reviewed with pt  by my nurse highlighting any changes in therapy/plan of care  recommended at today's visit.

## 2017-04-15 NOTE — Telephone Encounter (Signed)
Spoke with the pt and notified of recs per MW  She verbalized understanding  Rx was sent to pharm  

## 2017-04-17 DIAGNOSIS — R42 Dizziness and giddiness: Secondary | ICD-10-CM | POA: Diagnosis not present

## 2017-04-18 ENCOUNTER — Other Ambulatory Visit: Payer: Self-pay | Admitting: Nurse Practitioner

## 2017-04-18 DIAGNOSIS — R42 Dizziness and giddiness: Secondary | ICD-10-CM

## 2017-04-21 ENCOUNTER — Other Ambulatory Visit: Payer: Medicare Other

## 2017-04-23 ENCOUNTER — Ambulatory Visit
Admission: RE | Admit: 2017-04-23 | Discharge: 2017-04-23 | Disposition: A | Discharge status: Home / Self Care | Payer: Medicare Other | Source: Ambulatory Visit | Attending: Nurse Practitioner | Admitting: Nurse Practitioner

## 2017-04-23 DIAGNOSIS — R55 Syncope and collapse: Secondary | ICD-10-CM | POA: Diagnosis not present

## 2017-04-23 DIAGNOSIS — R42 Dizziness and giddiness: Secondary | ICD-10-CM

## 2017-04-28 ENCOUNTER — Encounter: Payer: Self-pay | Admitting: Adult Health

## 2017-04-28 ENCOUNTER — Ambulatory Visit (INDEPENDENT_AMBULATORY_CARE_PROVIDER_SITE_OTHER): Payer: Medicare Other | Admitting: Adult Health

## 2017-04-28 DIAGNOSIS — J449 Chronic obstructive pulmonary disease, unspecified: Secondary | ICD-10-CM | POA: Diagnosis not present

## 2017-04-28 NOTE — Progress Notes (Signed)
Chart and office note reviewed in detail  > agree with a/p as outlined    

## 2017-04-28 NOTE — Progress Notes (Signed)
$'@Patient'R$  ID: [name], female    DOB: [birth year], 71 y.o.   MRN: [id number]  Chief Complaint  Patient presents with  . Follow-up    COPD     Referring provider: [name], MD  HPI: 71 yo female former smoker followed for GOLD II COPD and chronic cough .   TEST  Spirometry 04/14/2017  FEV1 1.13 (58%)  Ratio 52 p saba w/in 4 h > add symb 80 2bid   allergy profile 03/10/14  >  IgE 18 with neg RAST - sinus ct 10/25/2014 > Clear sinuses. - try off cozar 11/03/2014 >>> - neurontin 100 tid rx 11/30/14 > improved 01/03/2015 > changed to lyrica 04/2015 but not taking consistently as of 01/30/2016  - sinus CT 02/17/15 > neg    04/28/2017 Follow up : COPD /Cough  Patient presents for a two-week follow-up. She was treated for COPD exacerbation last visit. With prednisone and antibiotics. Patient returns feeling improved,  Congestion is resolved.  Cough is some better. Still has throat clearing and intermittent dry cough . We discussed using her chlrotrimeton to help with drainage in throat . Denies fever, hemoptysis or edema.  She remains on Symbicort twice daily  We reviewed all her medications organize them into a medication count with patient education. Patient appears be taking her medications correctly.    No Known Allergies  Immunization History  Administered Date(s) Administered  . Influenza Split 09/29/2013, 09/30/2015  . Influenza Whole 08/30/2016  . Influenza,inj,Quad PF,36+ Mos 09/12/2014  . Pneumococcal-Unspecified 10/05/2013    Past Medical History:  Diagnosis Date  . Diabetes ([place])   . High cholesterol   . Hypertension     Tobacco History: History  Smoking Status  . Former Smoker  . Packs/day: 0.25  . Years: 42.00  . Types: Cigarettes  . Quit date: 01/30/2006  Smokeless Tobacco  . Never Used   Counseling given: Not Answered   Outpatient Encounter Prescriptions as of 04/28/2017  Medication Sig  . amoxicillin-clavulanate (AUGMENTIN) 875-125 MG tablet  Take 1 tablet by mouth 2 (two) times daily.  Marland Kitchen aspirin 81 MG tablet Take 81 mg by mouth daily.   . bisoprolol (ZEBETA) 5 MG tablet TAKE 1/2 TAB BY MOUTH DAILY (Patient taking differently: TAKE 2.5 mg BY MOUTH DAILY)  . budesonide-formoterol (SYMBICORT) 80-4.5 MCG/ACT inhaler Inhale 2 puffs into the lungs 2 (two) times daily.  Marland Kitchen CALCIUM PO Take 1 tablet by mouth daily.  . Cholecalciferol (VITAMIN D3) 2000 UNITS TABS Take 2,000 Units by mouth daily.  . famotidine (PEPCID) 20 MG tablet TAKE 1 TABLET (20 MG TOTAL) BY MOUTH AT BEDTIME.  Marland Kitchen Liraglutide (VICTOZA) 18 MG/3ML SOPN Inject 18 mg into the skin daily.  Marland Kitchen LYRICA 75 MG capsule Take 75 mg by mouth daily as needed.   . montelukast (SINGULAIR) 10 MG tablet TAKE 1 TABLET (10 MG TOTAL) BY MOUTH AT BEDTIME.  . pantoprazole (PROTONIX) 40 MG tablet Take 1 tablet (40 mg total) by mouth daily. Take 30-60 min before first meal of the day  . predniSONE (DELTASONE) 10 MG tablet Take  4 each am x 2 days,   2 each am x 2 days,  1 each am x 2 days and stop  . rosuvastatin (CRESTOR) 20 MG tablet Take 20 mg by mouth every evening.   . traMADol (ULTRAM) 50 MG tablet Take 1 tablet (50 mg total) by mouth every 4 (four) hours as needed.   No facility-administered encounter medications on file as of  04/28/2017.      Review of Systems  Constitutional:   No  weight loss, night sweats,  Fevers, chills, fatigue, or  lassitude.  HEENT:   No headaches,  Difficulty swallowing,  Tooth/dental problems, or  Sore throat,                No sneezing, itching, ear ache, + nasal congestion, post nasal drip,   CV:  No chest pain,  Orthopnea, PND, swelling in lower extremities, anasarca, dizziness, palpitations, syncope.   GI  No heartburn, indigestion, abdominal pain, nausea, vomiting, diarrhea, change in bowel habits, loss of appetite, bloody stools.   Resp:    No chest wall deformity  Skin: no rash or lesions.  GU: no dysuria, change in color of urine, no urgency or  frequency.  No flank pain, no hematuria   MS:  No joint pain or swelling.  No decreased range of motion.  No back pain.    Physical Exam  BP 126/74 (BP Location: Right Arm, Cuff Size: Normal)   Pulse 76   Ht '5\' 5"'$  (1.651 m)   Wt 183 lb (83 kg)   SpO2 98%   BMI 30.45 kg/m   GEN: A/Ox3; pleasant , NAD, elderly    HEENT:  [place]/AT,  EACs-clear, TMs-wnl, NOSE-clear, THROAT-clear, no lesions, no postnasal drip or exudate noted.   NECK:  Supple w/ fair ROM; no JVD; normal carotid impulses w/o bruits; no thyromegaly or nodules palpated; no lymphadenopathy.    RESP  Clear  P & A; w/o, wheezes/ rales/ or rhonchi. no accessory muscle use, no dullness to percussion  CARD:  RRR, no m/r/g, no peripheral edema, pulses intact, no cyanosis or clubbing.  GI:   Soft & nt; nml bowel sounds; no organomegaly or masses detected.   Musco: Warm bil, no deformities or joint swelling noted.   Neuro: alert, no focal deficits noted.    Skin: Warm, no lesions or rashes    Lab Results:   BNP No results found for: BNP   Imaging: Dg Chest 2 View  Result Date: 04/14/2017 CLINICAL DATA:  COPD mixed type EXAM: CHEST  2 VIEW COMPARISON:  09/28/2016 FINDINGS: Normal heart size and normal vascularity. 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 Lungs are clear without infiltrate or effusion or mass. Mild pulmonary hyperinflation. IMPRESSION: No active cardiopulmonary disease. Electronically Signed   By: Franchot Gallo M.D.   On: 04/14/2017 15:06   Ct Head Wo Contrast  Result Date: 04/23/2017 CLINICAL DATA:  Subacute onset of syncope. Double vision. Initial encounter. EXAM: CT HEAD WITHOUT CONTRAST TECHNIQUE: Contiguous axial images were obtained from the base of the skull through the vertex without intravenous contrast. COMPARISON:  CT of the head performed 12/09/2007, and MRI of the brain performed 12/28/2007 FINDINGS: Brain: No evidence of acute infarction, hemorrhage, hydrocephalus, extra-axial  collection or mass lesion/mass effect. Mild periventricular and subcortical white matter change likely reflects small vessel ischemic microangiopathy. A small chronic lacunar infarct is noted at the right thalamus. The posterior fossa, including the cerebellum, brainstem and fourth ventricle, is within normal limits. The third and lateral ventricles are unremarkable in appearance. The cerebral hemispheres are symmetric in appearance, with normal gray-white differentiation. No mass effect or midline shift is seen. Vascular: No hyperdense vessel or unexpected calcification. Skull: There is no evidence of fracture; visualized osseous structures are unremarkable in appearance. Sinuses/Orbits: The orbits are within normal limits. The paranasal sinuses and mastoid air cells are well-aerated. Other: No significant soft tissue abnormalities  are seen. IMPRESSION: 1. No acute intracranial pathology seen on CT. 2. Scattered small vessel ischemic microangiopathy. 3. 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 Electronically Signed   By: Garald Balding M.D.   On: 04/23/2017 16:17     Assessment & Plan:   COPD GOLD II  Recent flare now resolving  Patient's medications were reviewed today and patient education was given. Computerized medication calendar was adjusted/completed   Plan  Patient Instructions  Continue on current regimen .  Follow med calendar closely and bring to each visit.  Follow up with Dr. [name]  In 3-4 months and As needed         Rexene Edison, NP 04/28/2017

## 2017-04-28 NOTE — Assessment & Plan Note (Addendum)
Recent flare now resolving  Patient's medications were reviewed today and patient education was given. Computerized medication calendar was adjusted/completed   Plan  Patient Instructions  Continue on current regimen .  Follow med calendar closely and bring to each visit.  Follow up with Dr. [name]  In 3-4 months and As needed

## 2017-04-28 NOTE — Patient Instructions (Signed)
Continue on current regimen .  Follow med calendar closely and bring to each visit.  Follow up with Dr. [name]  In 3-4 months and As needed

## 2017-05-07 NOTE — Addendum Note (Signed)
Addended by: Parke Poisson E on: 05/07/2017 09:13 AM   Modules accepted: Orders

## 2017-05-08 DIAGNOSIS — R42 Dizziness and giddiness: Secondary | ICD-10-CM | POA: Diagnosis not present

## 2017-05-31 DIAGNOSIS — Z0000 Encounter for general adult medical examination without abnormal findings: Secondary | ICD-10-CM | POA: Diagnosis not present

## 2017-05-31 DIAGNOSIS — E1165 Type 2 diabetes mellitus with hyperglycemia: Secondary | ICD-10-CM | POA: Diagnosis not present

## 2017-05-31 DIAGNOSIS — M79601 Pain in right arm: Secondary | ICD-10-CM | POA: Diagnosis not present

## 2017-05-31 DIAGNOSIS — G5711 Meralgia paresthetica, right lower limb: Secondary | ICD-10-CM | POA: Diagnosis not present

## 2017-05-31 DIAGNOSIS — E559 Vitamin D deficiency, unspecified: Secondary | ICD-10-CM | POA: Diagnosis not present

## 2017-06-13 ENCOUNTER — Other Ambulatory Visit: Payer: Self-pay | Admitting: Internal Medicine

## 2017-06-13 DIAGNOSIS — G5711 Meralgia paresthetica, right lower limb: Secondary | ICD-10-CM

## 2017-06-25 ENCOUNTER — Ambulatory Visit
Admission: RE | Admit: 2017-06-25 | Discharge: 2017-06-25 | Disposition: A | Discharge status: Home / Self Care | Payer: Medicare Other | Source: Ambulatory Visit | Attending: Internal Medicine | Admitting: Internal Medicine

## 2017-06-25 DIAGNOSIS — M50223 Other cervical disc displacement at C6-C7 level: Secondary | ICD-10-CM | POA: Diagnosis not present

## 2017-06-25 DIAGNOSIS — M50221 Other cervical disc displacement at C4-C5 level: Secondary | ICD-10-CM | POA: Diagnosis not present

## 2017-06-25 DIAGNOSIS — G5711 Meralgia paresthetica, right lower limb: Secondary | ICD-10-CM

## 2017-07-18 DIAGNOSIS — M5412 Radiculopathy, cervical region: Secondary | ICD-10-CM | POA: Diagnosis not present

## 2017-07-18 DIAGNOSIS — M542 Cervicalgia: Secondary | ICD-10-CM | POA: Diagnosis not present

## 2017-07-21 ENCOUNTER — Other Ambulatory Visit: Payer: Self-pay

## 2017-07-22 DIAGNOSIS — M7581 Other shoulder lesions, right shoulder: Secondary | ICD-10-CM | POA: Diagnosis not present

## 2017-07-22 DIAGNOSIS — M542 Cervicalgia: Secondary | ICD-10-CM | POA: Diagnosis not present

## 2017-08-19 ENCOUNTER — Ambulatory Visit (INDEPENDENT_AMBULATORY_CARE_PROVIDER_SITE_OTHER): Payer: Medicare Other | Admitting: Internal Medicine

## 2017-08-19 ENCOUNTER — Encounter: Payer: Self-pay | Admitting: Internal Medicine

## 2017-08-19 VITALS — BP 138/82 | HR 87 | Ht 65.0 in | Wt 185.8 lb

## 2017-08-19 DIAGNOSIS — R05 Cough: Secondary | ICD-10-CM

## 2017-08-19 DIAGNOSIS — R059 Cough, unspecified: Secondary | ICD-10-CM

## 2017-08-19 DIAGNOSIS — J449 Chronic obstructive pulmonary disease, unspecified: Secondary | ICD-10-CM

## 2017-08-19 MED ORDER — PREDNISONE 10 MG PO TABS: ORAL_TABLET | ORAL | 0 refills | Status: DC

## 2017-08-19 MED ORDER — BUDESONIDE-FORMOTEROL FUMARATE 80-4.5 MCG/ACT IN AERO: INHALATION_SPRAY | RESPIRATORY_TRACT | 11 refills | Status: DC

## 2017-08-19 MED ORDER — OXYCODONE HCL 5 MG PO TABS: 5.0000 mg | ORAL_TABLET | ORAL | 0 refills | Status: DC | PRN

## 2017-08-19 MED ORDER — HYDROCODONE-ACETAMINOPHEN 5-325 MG PO TABS: 1.0000 | ORAL_TABLET | Freq: Four times a day (QID) | ORAL | 0 refills | Status: DC | PRN

## 2017-08-19 MED ORDER — BUDESONIDE-FORMOTEROL FUMARATE 80-4.5 MCG/ACT IN AERO: 2.0000 | INHALATION_SPRAY | Freq: Two times a day (BID) | RESPIRATORY_TRACT | 0 refills | Status: DC

## 2017-08-19 NOTE — Progress Notes (Signed)
Subjective:   Patient ID: [name], female    DOB: 1946/12/25    MRN: [id number]    Brief patient profile:  63 yobf mother of a PA in [employer] quit smoking 2007 with chronic cough x 2011 self referred for evaluation of persistent cough in setting of technically GOLD II copd dx 02/2014 with only mild obstructed pattern - has noted since high school a tendency to tickle in throat causing intermittent cough waxing and waning ever since    History of Present Illness  10/19/2014 1st [facility] office visit/ [facility]   Chief Complaint  Patient presents with  . Pulmonary Consult    Former pt of Dr. Annamaria Boots. Pt c/o increased cough x 3 wks. Cough is prod with moderate, thick, light yellow sputum.  She coughs until loses urinary continence.  Not sleeping well due to cough.   remembers having tendency to throat tickle in high school  but completely resolved by graduation  then recurred in her 3s and resolved with codeine then recurred in 2011  but bad to worse x 3 weeks assoc with sensation of choking, mucus is thick light yellow.  acei stopped about a month prior to OV  Replaced by losartan  Inhalers make it worse, esp dpi  Kouffman Reflux v Neurogenic Cough Differentiator Reflux Comments  Do you awaken from a sound sleep coughing violently?                            With trouble breathing? Yes   Do you have choking episodes when you cannot  Get enough air, gasping for air ?              Yes   Do you usually cough when you lie down into  The bed, or when you just lie down to rest ?                          Yes   Do you usually cough after meals or eating?         maybe   Do you cough when (or after) you bend over?    No    GERD SCORE     Kouffman Reflux v Neurogenic Cough Differentiator Neurogenic   Do you more-or-less cough all day long? yes   Does change of temperature make you cough? no   Does laughing or chuckling cause you to cough? no   Do fumes (perfume, automobile fumes, burned   Toast, etc.,) cause you to cough ?      Not aware   Does speaking, singing, or talking on the phone cause you to cough   ?               sometimes   Neurogenic/Airway score    rec  Max rx gerd/prn tramadol     11/30/2014 f/u ov/[facility] re: chronic cough  Chief Complaint  Patient presents with  . Acute Visit    Pt states that her cough is no better since last visit here 11/18/14. She also c/o frequent throat clearing and "rattling in throat". She states that there have been 3 occasions since last visit where she woke up from sleeping with cough and "felt like choking".   Overall pattern has not improved on rx unless includes tramadol  though not clear that she's been able to take the complex regimen  We've recommended and has not  yet started the prednisone that was recommended over the phone  rec Start dulera 100 Take 2 puffs first thing in am and then another 2 puffs about 12 hours later.  Work on inhaler technique:  Get the prednisone right away For cough > mucinex dm up to 1200 mg twice daily and supplement with tramadol 50 mg up to 2 every 4 hours if needed  Change neurontin to 100 three times daily     01/03/2015 f/u ov/[facility] re: cough since 2011/ GOLD II COPD / tickle since childhood  Chief Complaint  Patient presents with  . Follow-up    cough has resolved; no concerns today   prednisone may have helped  This time / used it with tramadol but no more tramadol x weeks and no flare  May have caught cold x  sev days but so far just controlling cough with candy/ ? Better on dulera > note sure, may have caused choking  Stopped acid suppression/  dulera but still on neurontin 100 tid rec Stay on neurontin (gabapentin) three times a day indefinitely  At first sign of flare > Pantoprazole (protonix) 40 mg   Take 30-60 min before first meal of the day and Pepcid 20 mg one bedtime until gone and return if needed If all else fails and you are having trouble breathing try dulera 100 up to 2 every  12 hours  F/u is prn     04/14/2017 acute extended ov/[facility] re:  Copd II  On proair bid as only "maint"  Chief Complaint  Patient presents with  . Follow-up    Pt states her breathing is ok, pt is still coughing, the cough is productive with thick yellow mucus,pt is wheezing at times Denies chest tightness,fever   wheeze better p uses proair  Totally off the regimen that previously improved the cough and focused on her cough medication refills with narcotic containing meds  Had med calendar but not updating/ using Doe = MMRC3 = can't walk 100 yards even at a slow pace at a flat grade s stopping due to sob   rec  Pantoprazole (protonix) 40 mg   Take  30-60 min before first meal of the day and continue  Pepcid (famotidine)  20 mg one @  bedtime until return to office   Take delsym two tsp every 12 hours and supplement if needed with  tramadol 50 mg up to 2 every 4 hours to suppress the urge to cough. Swallowing water or using ice chips/non mint and menthol containing candies (such as lifesavers or sugarless jolly ranchers) are also effective.  You should rest your voice and avoid activities that you know make you cough. Once you have eliminated the cough for 3 straight days try reducing the tramadol first,  then the delsym as tolerated.   Prednisone 10 mg take  4 each am x 2 days,   2 each am x 2 days,  1 each am x 2 days and stop  Plan A = Automatic = symbicort 80 Take 2 puffs first thing in am and then another 2 puffs about 12 hours later.  Plan B = Backup Only use your albuterol as a rescue medication See Tammy NP w/in 2 weeks (or next available after that) with all your medications, even over the counter meds, separated in two separate bags, the ones you take no matter what vs the ones you stop once you feel better and take only as needed when you feel you need them.  Tammy  will generate for you a new user friendly medication calendar that will put [place] all on the same page re: your medication  use.   Please remember to go to the  x-ray department downstairs in the basement  for your tests - we will call you with the results when they are available. Add: Augmentin 875 mg take one pill twice daily  X 10 days - take at breakfast and supper with large glass of water.  It would help reduce the usual side effects (diarrhea and yeast infections) if you ate cultured yogurt at lunch.     04/28/17 NP f/u ov much better rec  Med calendar/ no change     08/19/2017  Acute extended  ov/[facility] re: GOLD II copd/ chronic cough no longer on symbicort Chief Complaint  Patient presents with  . Acute Visit    Pt states that she started coughing 08/04/17 and it has gradually become worse. Pt also c/o SOB and feels like someone is sitting on her stomach.   Sleeping had been doing fine p h1 hs but still some daytime throat clearing then much worse since aug 6 not using  action plan on med calendar/ did not bring/ not on symbicort, says pred is the only thing that helps this type of cough which is dry unless coughs so hard vomits / gags and loses her breath and speech.  No obvious day to day or daytime variability or assoc excess/ purulent sputum or mucus plugs or hemoptysis or cp or chest tightness, subjective wheeze or overt sinus or hb symptoms. No unusual exp hx or h/o childhood pna/ asthma or knowledge of premature birth.  Sleeping ok without nocturnal  or early am exacerbation  of respiratory  c/o's or need for noct saba. Also denies any obvious fluctuation of symptoms with weather or environmental changes or other aggravating or alleviating factors except as outlined above   Current Medications, Allergies, Complete Past Medical History, Past Surgical History, Family History, and Social History were reviewed in [employer] record.  ROS  The following are not active complaints unless bolded sore throat, dysphagia, dental problems, itching, sneezing,  nasal congestion or excess/  purulent secretions, ear ache,   fever, chills, sweats, unintended wt loss, classically pleuritic or exertional cp,  orthopnea pnd or leg swelling, presyncope, palpitations, abdominal pain, anorexia, nausea, vomiting, diarrhea  or change in bowel or bladder habits, change in stools or urine, dysuria,hematuria,  rash, arthralgias, visual complaints, headache, numbness, weakness or ataxia or problems with walking or coordination,  change in mood/affect or memory.                   Objective:   Physical Exam  amb bf nad    04/07/2015 207  > 04/21/2015 208 > 06/01/2015  210>211 09/08/2015 > 01/30/2016   184 > 10/23/2016  190 > 04/14/2017   183 > 08/19/2017  185    Vital signs reviewed  - - Note on arrival 02 sats  98% on RA    HEENT: nl dentition,  and oropharynx. Nl external ear canals without cough reflex Nonspecific turnbinate edema , non tender sinus.   NECK :  without JVD/Nodes/TM/ nl carotid upstrokes bilaterally   LUNGS: no acc muscle use- scattered  insp and exp rhonchi bilaterally with cough early on insp variably present    CV:  RRR  no s3 or murmur or increase in P2, no edema   ABD:  soft and nontender  with nl excursion in the supine position. No bruits or organomegaly, bowel sounds nl  MS:  warm without deformities, calf tenderness, cyanosis or clubbing  SKIN: warm and dry without lesions    NEURO:  alert, approp, no deficits

## 2017-08-19 NOTE — Patient Instructions (Addendum)
Prednisone 10 mg take  4 each am x 2 days,   2 each am x 2 days,  1 each am x 2 days and stop   Restart symbicort 80 Take 2 puffs first thing in am and then another 2 puffs about 12 hours later.   For cough >  Benzoate first then oxycodone supplement up to every 4 hours as needed   .Please schedule a follow up office visit in 6 weeks, call sooner if needed with med calendar we did today and  all medications /inhalers/ solutions in hand so we can verify exactly what you are taking. This includes all medications from all doctors and over the counters

## 2017-08-20 NOTE — Assessment & Plan Note (Signed)
-   allergy profile 03/10/14  >  IgE 18 with neg RAST - sinus ct 10/25/2014 > Clear sinuses. 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   These are the same pts (now being labeled as having "irritable larynx syndrome" by some cough centers) who not infrequently have a history of having failed to tolerate ace inhibitors,  dry powder inhalers or biphosphonates or report having atypical/extraesophageal reflux symptoms(she must have gerd if coughs so hards she vomits)  that don't respond to standard doses of PPI  and are easily confused as having aecopd or asthma flares by even experienced allergists/ pulmonologists (myself included).   Reviewed approp use of 1st gen h1 to control throat tickle and if all else fails then use oxycodone 5 mg short term only since "tramadol doesn't work anymore"

## 2017-08-20 NOTE — Assessment & Plan Note (Signed)
Quit smoking 2007 PFT: 03/17/2014-  FEV1  1.28 (58%) ratio 66 and no better p saba, ERV 27 and DLCO 56 corrects to 77 - 11/30/2014   try dulera 100 2bid > 01/03/2015 not clear helping,  use prn > flare since mid 02/2015 cough > sob  - 04/07/2015 p extensive coaching HFA effectiveness =    90% > try dulera 100 2bid > improved 04/21/15   - 06/01/2015   try dulera 200 2bid  > did not maintain  - Spirometry 04/14/2017  FEV1 1.13 (58%)  Ratio 52 p saba w/in 4 h > add symb 80 2bid    - 08/19/2017  After extensive coaching HFA effectiveness =    75%  From baseline of 50 > resumed sym 80 2bid  Reports much better while on prednisone then severe flares off it suggesting AB component plus obvious chronic issues with uacs so rec rechallenge with low dose ics and regroup in 6 weeks with all meds in hand using a trust but verify approach to confirm accurate Medication  Reconciliation The principal here is that until we are certain that the  patients are doing what we've asked, it makes no sense to ask them to do more.    I had an extended discussion with the patient reviewing all relevant studies completed to date and  lasting 25 minutes of a 40  minute acute office visit addressing    severe non-specific but potentially very serious refractory respiratory symptoms of uncertain and potentially multiple  etiologies.       Please see AVS for specific instructions unique to this office visit that I personally wrote and verbalized to the the pt in detail and then reviewed with pt  by my nurse highlighting any changes in therapy/plan of care  recommended at today's visit.        Each maintenance medication was reviewed in detail including most importantly the difference between maintenance and as needed and under what circumstances the prns are to be used. This was done in the context of a medication calendar review which provided the patient with a user-friendly unambiguous mechanism for medication administration and  reconciliation and provides an action plan for all active problems. It is critical that this be shown to every doctor  for modification during the office visit if necessary so the patient can use it as a working document.

## 2017-08-28 ENCOUNTER — Ambulatory Visit: Payer: Medicare Other | Admitting: Internal Medicine

## 2017-09-03 ENCOUNTER — Other Ambulatory Visit: Payer: Self-pay | Admitting: Internal Medicine

## 2017-09-10 ENCOUNTER — Inpatient Hospital Stay (HOSPITAL_COMMUNITY)
Admission: EM | Admit: 2017-09-10 | Discharge: 2017-09-14 | DRG: 189 | Disposition: A | Discharge status: Home / Self Care | Payer: Medicare Other | Attending: Nephrology | Admitting: Nephrology

## 2017-09-10 ENCOUNTER — Inpatient Hospital Stay (HOSPITAL_COMMUNITY): Payer: Medicare Other

## 2017-09-10 ENCOUNTER — Encounter (HOSPITAL_COMMUNITY): Payer: Self-pay | Admitting: Emergency Medicine

## 2017-09-10 ENCOUNTER — Emergency Department (HOSPITAL_COMMUNITY): Payer: Medicare Other

## 2017-09-10 DIAGNOSIS — R402362 Coma scale, best motor response, obeys commands, at arrival to emergency department: Secondary | ICD-10-CM | POA: Diagnosis present

## 2017-09-10 DIAGNOSIS — R402142 Coma scale, eyes open, spontaneous, at arrival to emergency department: Secondary | ICD-10-CM | POA: Diagnosis present

## 2017-09-10 DIAGNOSIS — R911 Solitary pulmonary nodule: Secondary | ICD-10-CM | POA: Diagnosis present

## 2017-09-10 DIAGNOSIS — Z833 Family history of diabetes mellitus: Secondary | ICD-10-CM | POA: Diagnosis not present

## 2017-09-10 DIAGNOSIS — R0682 Tachypnea, not elsewhere classified: Secondary | ICD-10-CM | POA: Diagnosis not present

## 2017-09-10 DIAGNOSIS — R197 Diarrhea, unspecified: Secondary | ICD-10-CM | POA: Diagnosis not present

## 2017-09-10 DIAGNOSIS — E785 Hyperlipidemia, unspecified: Secondary | ICD-10-CM | POA: Diagnosis present

## 2017-09-10 DIAGNOSIS — J181 Lobar pneumonia, unspecified organism: Secondary | ICD-10-CM | POA: Diagnosis not present

## 2017-09-10 DIAGNOSIS — R402252 Coma scale, best verbal response, oriented, at arrival to emergency department: Secondary | ICD-10-CM | POA: Diagnosis present

## 2017-09-10 DIAGNOSIS — Z87891 Personal history of nicotine dependence: Secondary | ICD-10-CM | POA: Diagnosis not present

## 2017-09-10 DIAGNOSIS — R05 Cough: Secondary | ICD-10-CM | POA: Diagnosis not present

## 2017-09-10 DIAGNOSIS — E874 Mixed disorder of acid-base balance: Secondary | ICD-10-CM | POA: Diagnosis present

## 2017-09-10 DIAGNOSIS — E119 Type 2 diabetes mellitus without complications: Secondary | ICD-10-CM | POA: Diagnosis present

## 2017-09-10 DIAGNOSIS — E7800 Pure hypercholesterolemia, unspecified: Secondary | ICD-10-CM | POA: Diagnosis present

## 2017-09-10 DIAGNOSIS — T380X5A Adverse effect of glucocorticoids and synthetic analogues, initial encounter: Secondary | ICD-10-CM | POA: Diagnosis present

## 2017-09-10 DIAGNOSIS — K219 Gastro-esophageal reflux disease without esophagitis: Secondary | ICD-10-CM | POA: Diagnosis present

## 2017-09-10 DIAGNOSIS — Z7982 Long term (current) use of aspirin: Secondary | ICD-10-CM | POA: Diagnosis not present

## 2017-09-10 DIAGNOSIS — J9600 Acute respiratory failure, unspecified whether with hypoxia or hypercapnia: Secondary | ICD-10-CM | POA: Diagnosis present

## 2017-09-10 DIAGNOSIS — J441 Chronic obstructive pulmonary disease with (acute) exacerbation: Secondary | ICD-10-CM

## 2017-09-10 DIAGNOSIS — Z90710 Acquired absence of both cervix and uterus: Secondary | ICD-10-CM | POA: Diagnosis not present

## 2017-09-10 DIAGNOSIS — J9601 Acute respiratory failure with hypoxia: Principal | ICD-10-CM | POA: Diagnosis present

## 2017-09-10 DIAGNOSIS — J189 Pneumonia, unspecified organism: Secondary | ICD-10-CM

## 2017-09-10 DIAGNOSIS — I10 Essential (primary) hypertension: Secondary | ICD-10-CM | POA: Diagnosis present

## 2017-09-10 DIAGNOSIS — Z8249 Family history of ischemic heart disease and other diseases of the circulatory system: Secondary | ICD-10-CM

## 2017-09-10 DIAGNOSIS — R0602 Shortness of breath: Secondary | ICD-10-CM | POA: Diagnosis not present

## 2017-09-10 HISTORY — DX: Pneumonia, unspecified organism: J18.9

## 2017-09-10 HISTORY — DX: Chronic obstructive pulmonary disease, unspecified: J44.9

## 2017-09-10 LAB — URINALYSIS, ROUTINE W REFLEX MICROSCOPIC
Bilirubin Urine: NEGATIVE
Glucose, UA: 50 mg/dL — AB
Hgb urine dipstick: NEGATIVE
Ketones, ur: 5 mg/dL — AB
Nitrite: NEGATIVE
Protein, ur: NEGATIVE mg/dL
Specific Gravity, Urine: 1.006 (ref 1.005–1.030)
pH: 5 (ref 5.0–8.0)

## 2017-09-10 LAB — I-STAT VENOUS BLOOD GAS, ED
Acid-base deficit: 7 mmol/L — ABNORMAL HIGH (ref 0.0–2.0)
Bicarbonate: 19.1 mmol/L — ABNORMAL LOW (ref 20.0–28.0)
O2 Saturation: 86 %
TCO2: 20 mmol/L — ABNORMAL LOW (ref 22–32)
pCO2, Ven: 41.5 mmHg — ABNORMAL LOW (ref 44.0–60.0)
pH, Ven: 7.272 (ref 7.250–7.430)
pO2, Ven: 57 mmHg — ABNORMAL HIGH (ref 32.0–45.0)

## 2017-09-10 LAB — CBC WITH DIFFERENTIAL/PLATELET
Basophils Absolute: 0 10*3/uL (ref 0.0–0.1)
Basophils Relative: 0 %
Eosinophils Absolute: 0.1 10*3/uL (ref 0.0–0.7)
Eosinophils Relative: 2 %
HCT: 42.5 % (ref 36.0–46.0)
Hemoglobin: 14.5 g/dL (ref 12.0–15.0)
Lymphocytes Relative: 34 %
Lymphs Abs: 2.3 10*3/uL (ref 0.7–4.0)
MCH: 30 pg (ref 26.0–34.0)
MCHC: 34.1 g/dL (ref 30.0–36.0)
MCV: 88 fL (ref 78.0–100.0)
Monocytes Absolute: 0.7 10*3/uL (ref 0.1–1.0)
Monocytes Relative: 10 %
Neutro Abs: 3.7 10*3/uL (ref 1.7–7.7)
Neutrophils Relative %: 54 %
Platelets: 303 10*3/uL (ref 150–400)
RBC: 4.83 MIL/uL (ref 3.87–5.11)
RDW: 13.6 % (ref 11.5–15.5)
WBC: 6.8 10*3/uL (ref 4.0–10.5)

## 2017-09-10 LAB — CBC
HCT: 38.7 % (ref 36.0–46.0)
Hemoglobin: 12.9 g/dL (ref 12.0–15.0)
MCH: 29.7 pg (ref 26.0–34.0)
MCHC: 33.3 g/dL (ref 30.0–36.0)
MCV: 89 fL (ref 78.0–100.0)
Platelets: 309 10*3/uL (ref 150–400)
RBC: 4.35 MIL/uL (ref 3.87–5.11)
RDW: 13.8 % (ref 11.5–15.5)
WBC: 4.7 10*3/uL (ref 4.0–10.5)

## 2017-09-10 LAB — POCT I-STAT 3, ART BLOOD GAS (G3+)
Acid-base deficit: 10 mmol/L — ABNORMAL HIGH (ref 0.0–2.0)
Bicarbonate: 15.2 mmol/L — ABNORMAL LOW (ref 20.0–28.0)
O2 Saturation: 97 %
Patient temperature: 98.5
TCO2: 16 mmol/L — ABNORMAL LOW (ref 22–32)
pCO2 arterial: 31.6 mmHg — ABNORMAL LOW (ref 32.0–48.0)
pH, Arterial: 7.29 — ABNORMAL LOW (ref 7.350–7.450)
pO2, Arterial: 101 mmHg (ref 83.0–108.0)

## 2017-09-10 LAB — I-STAT ARTERIAL BLOOD GAS, ED
Acid-base deficit: 11 mmol/L — ABNORMAL HIGH (ref 0.0–2.0)
Bicarbonate: 16.6 mmol/L — ABNORMAL LOW (ref 20.0–28.0)
O2 Saturation: 82 %
Patient temperature: 100
TCO2: 18 mmol/L — ABNORMAL LOW (ref 22–32)
pCO2 arterial: 43 mmHg (ref 32.0–48.0)
pH, Arterial: 7.197 — CL (ref 7.350–7.450)
pO2, Arterial: 60 mmHg — ABNORMAL LOW (ref 83.0–108.0)

## 2017-09-10 LAB — GLUCOSE, CAPILLARY
Glucose-Capillary: 189 mg/dL — ABNORMAL HIGH (ref 65–99)
Glucose-Capillary: 176 mg/dL — ABNORMAL HIGH (ref 65–99)
Glucose-Capillary: 164 mg/dL — ABNORMAL HIGH (ref 65–99)
Glucose-Capillary: 166 mg/dL — ABNORMAL HIGH (ref 65–99)

## 2017-09-10 LAB — COMPREHENSIVE METABOLIC PANEL
ALT: 15 U/L (ref 14–54)
AST: 32 U/L (ref 15–41)
Albumin: 4.1 g/dL (ref 3.5–5.0)
Alkaline Phosphatase: 55 U/L (ref 38–126)
Anion gap: 10 (ref 5–15)
BUN: 5 mg/dL — ABNORMAL LOW (ref 6–20)
CO2: 23 mmol/L (ref 22–32)
Calcium: 9.2 mg/dL (ref 8.9–10.3)
Chloride: 103 mmol/L (ref 101–111)
Creatinine, Ser: 0.85 mg/dL (ref 0.44–1.00)
GFR calc Af Amer: >60 mL/min (ref >60)
GFR calc non Af Amer: >60 mL/min (ref >60)
Glucose, Bld: 136 mg/dL — ABNORMAL HIGH (ref 65–99)
Potassium: 3.9 mmol/L (ref 3.5–5.1)
Sodium: 136 mmol/L (ref 135–145)
Total Bilirubin: 1 mg/dL (ref 0.3–1.2)
Total Protein: 7.9 g/dL (ref 6.5–8.1)

## 2017-09-10 LAB — LACTIC ACID, PLASMA
Lactic Acid, Venous: 6.9 mmol/L (ref 0.5–1.9)
Lactic Acid, Venous: 3.9 mmol/L (ref 0.5–1.9)

## 2017-09-10 LAB — CREATININE, SERUM
Creatinine, Ser: 0.93 mg/dL (ref 0.44–1.00)
GFR calc Af Amer: >60 mL/min (ref >60)
GFR calc non Af Amer: >60 mL/min (ref >60)

## 2017-09-10 LAB — I-STAT CG4 LACTIC ACID, ED
Lactic Acid, Venous: 2.63 mmol/L (ref 0.5–1.9)
Lactic Acid, Venous: 5.5 mmol/L (ref 0.5–1.9)

## 2017-09-10 LAB — PROTIME-INR
INR: 1.09
Prothrombin Time: 14 seconds (ref 11.4–15.2)

## 2017-09-10 LAB — PROCALCITONIN: Procalcitonin: 0.11 ng/mL

## 2017-09-10 LAB — MRSA PCR SCREENING: MRSA by PCR: NEGATIVE

## 2017-09-10 LAB — APTT: aPTT: 25 seconds (ref 24–36)

## 2017-09-10 LAB — INFLUENZA PANEL BY PCR (TYPE A & B)
Influenza A By PCR: NEGATIVE
Influenza B By PCR: NEGATIVE

## 2017-09-10 LAB — I-STAT TROPONIN, ED: Troponin i, poc: 0 ng/mL (ref 0.00–0.08)

## 2017-09-10 LAB — STREP PNEUMONIAE URINARY ANTIGEN: Strep Pneumo Urinary Antigen: NEGATIVE

## 2017-09-10 MED ORDER — SODIUM CHLORIDE 0.9 % IV BOLUS (SEPSIS)
1000.0000 mL | Freq: Once | INTRAVENOUS | Status: AC
  Administered 2017-09-10: 1000 mL via INTRAVENOUS

## 2017-09-10 MED ORDER — SODIUM CHLORIDE 0.9 % IV SOLN
INTRAVENOUS | Status: DC
  Administered 2017-09-10: 500 mL via INTRAVENOUS
  Administered 2017-09-11: 100 mL/h via INTRAVENOUS

## 2017-09-10 MED ORDER — ALBUTEROL (5 MG/ML) CONTINUOUS INHALATION SOLN: 10.0000 mg/h | INHALATION_SOLUTION | RESPIRATORY_TRACT | Status: DC

## 2017-09-10 MED ORDER — PANTOPRAZOLE SODIUM 40 MG IV SOLR
40.0000 mg | Freq: Every day | INTRAVENOUS | Status: DC
  Administered 2017-09-10: 40 mg via INTRAVENOUS
  Filled 2017-09-10: qty 40

## 2017-09-10 MED ORDER — METHYLPREDNISOLONE SODIUM SUCC 125 MG IJ SOLR
80.0000 mg | Freq: Four times a day (QID) | INTRAMUSCULAR | Status: DC
  Administered 2017-09-10 – 2017-09-12 (×8): 80 mg via INTRAVENOUS
  Filled 2017-09-10: qty 1.28
  Filled 2017-09-10 (×3): qty 2
  Filled 2017-09-10 (×2): qty 1.28
  Filled 2017-09-10: qty 2
  Filled 2017-09-10 (×2): qty 1.28
  Filled 2017-09-10: qty 2

## 2017-09-10 MED ORDER — IPRATROPIUM BROMIDE 0.02 % IN SOLN
RESPIRATORY_TRACT | Status: AC
Start: 2017-09-10 — End: 2017-09-10
  Filled 2017-09-10: qty 2.5

## 2017-09-10 MED ORDER — FAMOTIDINE 20 MG PO TABS
20.0000 mg | ORAL_TABLET | Freq: Every day | ORAL | Status: DC
  Administered 2017-09-10 – 2017-09-13 (×4): 20 mg via ORAL
  Filled 2017-09-10 (×5): qty 1

## 2017-09-10 MED ORDER — OXYCODONE HCL 5 MG PO TABS
5.0000 mg | ORAL_TABLET | ORAL | Status: DC | PRN
  Administered 2017-09-11 – 2017-09-14 (×8): 5 mg via ORAL
  Filled 2017-09-10 (×10): qty 1

## 2017-09-10 MED ORDER — IPRATROPIUM BROMIDE 0.02 % IN SOLN
0.5000 mg | RESPIRATORY_TRACT | Status: DC
  Administered 2017-09-10 (×4): 0.5 mg via RESPIRATORY_TRACT
  Filled 2017-09-10 (×3): qty 2.5

## 2017-09-10 MED ORDER — ASPIRIN 300 MG RE SUPP
300.0000 mg | RECTAL | Status: AC
  Filled 2017-09-10: qty 1

## 2017-09-10 MED ORDER — SODIUM CHLORIDE 0.9 % IV SOLN: 250.0000 mL | INTRAVENOUS | Status: DC | PRN

## 2017-09-10 MED ORDER — SODIUM CHLORIDE 0.9 % IV BOLUS (SEPSIS)
500.0000 mL | Freq: Once | INTRAVENOUS | Status: AC
  Administered 2017-09-10: 500 mL via INTRAVENOUS

## 2017-09-10 MED ORDER — SODIUM CHLORIDE 0.9 % IV SOLN
Freq: Once | INTRAVENOUS | Status: AC
  Administered 2017-09-10: 1 mL via INTRAVENOUS

## 2017-09-10 MED ORDER — DEXTROSE 5 % IV SOLN
1.0000 g | Freq: Once | INTRAVENOUS | Status: AC
  Administered 2017-09-10: 1 g via INTRAVENOUS
  Filled 2017-09-10: qty 10

## 2017-09-10 MED ORDER — ALBUTEROL (5 MG/ML) CONTINUOUS INHALATION SOLN
10.0000 mg/h | INHALATION_SOLUTION | Freq: Once | RESPIRATORY_TRACT | Status: AC
  Administered 2017-09-10: 10 mg/h via RESPIRATORY_TRACT
  Filled 2017-09-10: qty 20

## 2017-09-10 MED ORDER — HEPARIN SODIUM (PORCINE) 5000 UNIT/ML IJ SOLN
5000.0000 [IU] | Freq: Three times a day (TID) | INTRAMUSCULAR | Status: DC
  Administered 2017-09-10 – 2017-09-13 (×9): 5000 [IU] via SUBCUTANEOUS
  Filled 2017-09-10 (×10): qty 1

## 2017-09-10 MED ORDER — DEXTROSE 5 % IV SOLN
500.0000 mg | Freq: Once | INTRAVENOUS | Status: AC
  Administered 2017-09-10: 500 mg via INTRAVENOUS
  Filled 2017-09-10: qty 500

## 2017-09-10 MED ORDER — PHENOL 1.4 % MT LIQD
1.0000 | OROMUCOSAL | Status: DC | PRN
  Filled 2017-09-10: qty 177

## 2017-09-10 MED ORDER — ALBUTEROL SULFATE (2.5 MG/3ML) 0.083% IN NEBU
2.5000 mg | INHALATION_SOLUTION | RESPIRATORY_TRACT | Status: DC
  Administered 2017-09-10 (×4): 2.5 mg via RESPIRATORY_TRACT
  Filled 2017-09-10 (×4): qty 3

## 2017-09-10 MED ORDER — SODIUM CHLORIDE 0.9 % IV SOLN
0.4000 ug/kg/h | INTRAVENOUS | Status: DC
  Administered 2017-09-10: 0.4 ug/kg/h via INTRAVENOUS
  Filled 2017-09-10: qty 2

## 2017-09-10 MED ORDER — INSULIN ASPART 100 UNIT/ML ~~LOC~~ SOLN
0.0000 [IU] | SUBCUTANEOUS | Status: DC
  Administered 2017-09-10 (×2): 2 [IU] via SUBCUTANEOUS
  Administered 2017-09-11: 1 [IU] via SUBCUTANEOUS
  Administered 2017-09-11: 2 [IU] via SUBCUTANEOUS
  Administered 2017-09-11: 1 [IU] via SUBCUTANEOUS
  Administered 2017-09-11: 3 [IU] via SUBCUTANEOUS
  Administered 2017-09-11 (×2): 2 [IU] via SUBCUTANEOUS
  Administered 2017-09-12: 1 [IU] via SUBCUTANEOUS
  Administered 2017-09-12 (×3): 2 [IU] via SUBCUTANEOUS
  Administered 2017-09-12 (×2): 3 [IU] via SUBCUTANEOUS
  Administered 2017-09-13: 1 [IU] via SUBCUTANEOUS
  Administered 2017-09-13: 2 [IU] via SUBCUTANEOUS
  Administered 2017-09-13: 5 [IU] via SUBCUTANEOUS
  Administered 2017-09-13: 2 [IU] via SUBCUTANEOUS
  Administered 2017-09-13: 3 [IU] via SUBCUTANEOUS
  Administered 2017-09-14: 7 [IU] via SUBCUTANEOUS
  Administered 2017-09-14: 3 [IU] via SUBCUTANEOUS
  Administered 2017-09-14: 2 [IU] via SUBCUTANEOUS

## 2017-09-10 MED ORDER — DEXTROSE 5 % IV SOLN
500.0000 mg | INTRAVENOUS | Status: DC
  Administered 2017-09-11: 500 mg via INTRAVENOUS
  Filled 2017-09-10: qty 500

## 2017-09-10 MED ORDER — ASPIRIN 81 MG PO CHEW: 324.0000 mg | CHEWABLE_TABLET | ORAL | Status: AC

## 2017-09-10 MED ORDER — PREGABALIN 75 MG PO CAPS
75.0000 mg | ORAL_CAPSULE | Freq: Every day | ORAL | Status: DC | PRN
  Administered 2017-09-11: 75 mg via ORAL
  Filled 2017-09-10: qty 1

## 2017-09-10 MED ORDER — CEFTRIAXONE SODIUM 1 G IJ SOLR
1.0000 g | INTRAMUSCULAR | Status: DC
Start: 2017-09-11 — End: 2017-09-11
  Administered 2017-09-11: 1 g via INTRAVENOUS
  Filled 2017-09-10: qty 10

## 2017-09-10 MED ORDER — IOPAMIDOL (ISOVUE-370) INJECTION 76%
INTRAVENOUS | Status: AC
  Administered 2017-09-10: 100 mL
  Filled 2017-09-10: qty 100

## 2017-09-10 NOTE — ED Provider Notes (Addendum)
[facility] DEPT Provider Note   CSN: [id number] Arrival date & time: 09/10/17  8546     History   Chief Complaint Chief Complaint  Patient presents with  . Shortness of Breath    HPI [name] is a 71 y.o. female.  Patient presents to the emergency department for evaluation of shortness of breath. Patient reports that symptoms began 3 days ago. She started having tightness in her chest and cough. Over the last 3 days cough has progressively worsened and she has gotten more and more short of breath. She comes to the ER by ambulance from home. EMS report significant wheezing and rhonchi with hypoxia on room air, given Solu-Medrol 125 mg and albuterol with Atrovent with only slight improvement.      Past Medical History:  Diagnosis Date  . Diabetes ([place])   . High cholesterol   . Hypertension     Patient Active Problem List   Diagnosis Date Noted  . Rhinovirus infection 10/02/2016  . Acute asthma exacerbation 10/02/2016  . Acute respiratory failure with hypoxia and hypercapnia (HCC)   . COPD with acute exacerbation ([place]) 09/28/2016  . Hypoxia 09/28/2016  . SOB (shortness of breath) on exertion 09/28/2016  . Acute exacerbation of chronic obstructive pulmonary disease (COPD) ([place]) 09/28/2016  . Essential hypertension 11/03/2014  . Cough 10/19/2014  . COPD GOLD II  03/21/2014  . Rhinitis, nonallergic 03/21/2014    Past Surgical History:  Procedure Laterality Date  . ABDOMINAL HYSTERECTOMY  1995  . carpel tunnel surgery  2006  . COLONOSCOPY  2014  . COLOSTOMY  2007  . colostomy let down  2008  . endoscopy  2014    OB History    No data available       Home Medications    Prior to Admission medications   Medication Sig Start Date End Date Taking? Authorizing Provider  albuterol (PROVENTIL HFA;VENTOLIN HFA) 108 (90 Base) MCG/ACT inhaler Inhale 1-2 puffs into the lungs every 6 (six) hours as needed for wheezing or shortness of breath.    [provider]  aspirin 81 MG tablet Take 81 mg by mouth daily.     [provider]  benzonatate (TESSALON) 200 MG capsule TAKE 1 CAPSULE (200 MG TOTAL) BY MOUTH 3 (THREE) TIMES DAILY AS NEEDED FOR COUGH. 07/21/17   [provider]  Biotin 2500 MCG CAPS Take 1 capsule by mouth daily.    [provider]  bisoprolol (ZEBETA) 5 MG tablet TAKE 1/2 TAB BY MOUTH DAILY Patient taking differently: TAKE 2.5 mg BY MOUTH DAILY 03/05/16   Tanda Rockers, MD  budesonide-formoterol (SYMBICORT) 80-4.5 MCG/ACT inhaler Take 2 puffs first thing in am and then another 2 puffs about 12 hours later. 08/19/17   Tanda Rockers, MD  CALCIUM PO Take 1 tablet by mouth daily.    [provider]  chlorpheniramine (CHLOR-TRIMETON) 4 MG tablet Take 4 mg by mouth at bedtime. Add 1 every 4 hours as needed for drainage, throat clearing    [provider]  Cholecalciferol (VITAMIN D3) 2000 UNITS TABS Take 2,000 Units by mouth daily.    [provider]  famotidine (PEPCID) 20 MG tablet TAKE 1 TABLET (20 MG TOTAL) BY MOUTH AT BEDTIME. 02/18/17   Tanda Rockers, MD  Liraglutide (VICTOZA) 18 MG/3ML SOPN Inject 18 mg into the skin at bedtime.     [provider]  LYRICA 75 MG capsule Take 75 mg by mouth daily as needed.  07/30/16   [provider]  montelukast (SINGULAIR) 10 MG tablet TAKE 1 TABLET (10 MG TOTAL) BY MOUTH AT BEDTIME. 11/20/16   Tanda Rockers, MD  montelukast (SINGULAIR) 10 MG tablet TAKE 1 TABLET (10 MG TOTAL) BY MOUTH AT BEDTIME. 09/03/17   Tanda Rockers, MD  oxyCODONE (ROXICODONE) 5 MG immediate release tablet Take 1 tablet (5 mg total) by mouth every 4 (four) hours as needed for severe pain. 08/19/17   Tanda Rockers, MD  pantoprazole (PROTONIX) 40 MG tablet Take 1 tablet (40 mg total) by mouth daily. Take 30-60 min before first meal of the day 04/14/17   Tanda Rockers, MD  predniSONE (DELTASONE) 10 MG tablet Take  4 each am x 2 days,   2 each am x  2 days,  1 each am x 2 days and stop 08/19/17   Tanda Rockers, MD  rosuvastatin (CRESTOR) 20 MG tablet Take 20 mg by mouth every evening.     [provider]  vitamin C (ASCORBIC ACID) 500 MG tablet Take 500 mg by mouth daily.    [provider]    Family History Family History  Problem Relation Age of Onset  . Heart attack Father   . Diabetes Father   . Diabetes Brother     Social History Social History  Substance Use Topics  . Smoking status: Former Smoker    Packs/day: 0.25    Years: 42.00    Types: Cigarettes    Quit date: 01/30/2006  . Smokeless tobacco: Never Used  . Alcohol use No     Allergies   Patient has no known allergies.   Review of Systems Review of Systems  Respiratory: Positive for cough, shortness of breath and wheezing.   All other systems reviewed and are negative.    Physical Exam Updated Vital Signs BP (!) 150/69 (BP Location: Right Arm)   Pulse 99   Temp 100 F (37.8 C) (Oral)   Resp (!) 37   Ht 5\' 5"  (1.651 m)   Wt 83.5 kg (184 lb)   SpO2 99%   BMI 30.62 kg/m   Physical Exam  Constitutional: She is oriented to person, place, and time. She appears well-developed and well-nourished. No distress.  HENT:  Head: Normocephalic and atraumatic.  Right Ear: Hearing normal.  Left Ear: Hearing normal.  Nose: Nose normal.  Mouth/Throat: Oropharynx is clear and moist and mucous membranes are normal.  Eyes: Pupils are equal, round, and reactive to light. Conjunctivae and EOM are normal.  Neck: Normal range of motion. Neck supple.  Cardiovascular: Regular rhythm, S1 normal and S2 normal.  Tachycardia present.  Exam reveals no gallop and no friction rub.   No murmur heard. Pulmonary/Chest: She is in respiratory distress. She has decreased breath sounds. She has wheezes. She has rhonchi. She exhibits no tenderness.  Abdominal: Soft. Normal appearance and bowel sounds are normal. There is no hepatosplenomegaly. There is no  tenderness. There is no rebound, no guarding, no tenderness at McBurney's point and negative Murphy's sign. No hernia.  Musculoskeletal: Normal range of motion.  Neurological: She is alert and oriented to person, place, and time. She has normal strength. No cranial nerve deficit or sensory deficit. Coordination normal. GCS eye subscore is 4. GCS verbal subscore is 5. GCS motor subscore is 6.  Skin: Skin is warm, dry and intact. No rash noted. No cyanosis.  Psychiatric: She has a normal mood and affect. Her speech is normal and behavior  is normal. Thought content normal.  Nursing note and vitals reviewed.    ED Treatments / Results  Labs (all labs ordered are listed, but only abnormal results are displayed) Labs Reviewed  CULTURE, BLOOD (ROUTINE X 2)  CULTURE, BLOOD (ROUTINE X 2)  COMPREHENSIVE METABOLIC PANEL  CBC WITH DIFFERENTIAL/PLATELET  URINALYSIS, ROUTINE W REFLEX MICROSCOPIC  INFLUENZA PANEL BY PCR (TYPE A & B)  I-STAT TROPONIN, ED  I-STAT CG4 LACTIC ACID, ED    EKG  EKG Interpretation  Date/Time:  Wednesday September 10 2017 06:29:16 EDT Ventricular Rate:  111 PR Interval:    QRS Duration: 91 QT Interval:  343 QTC Calculation: 467 R Axis:   78 Text Interpretation:  Sinus tachycardia Right atrial enlargement No significant change since last tracing Confirmed by Orpah Greek [phone]) on 09/10/2017 6:49:31 AM       Radiology No results found.  Procedures Procedures (including critical care time)  Medications Ordered in ED Medications  cefTRIAXone (ROCEPHIN) 1 g in dextrose 5 % 50 mL IVPB (not administered)  azithromycin (ZITHROMAX) 500 mg in dextrose 5 % 250 mL IVPB (not administered)  albuterol (PROVENTIL,VENTOLIN) solution continuous neb (10 mg/hr Nebulization Given 09/10/17 8921)     Initial Impression / Assessment and Plan / ED Course  I have reviewed the triage vital signs and the nursing notes.  Pertinent labs & imaging results that were  available during my care of the patient were reviewed by me and considered in my medical decision making (see chart for details).     Patient presents to the ER for a 3 day history of progressively worsening cough and shortness of breath. She does have a history of COPD. She is found to have a low-grade fever at arrival. She has been administered Solu-Medrol, albuterol and Atrovent and still has significant bronchospasm on arrival. She is tachycardic but not hypotensive. Tachycardia likely secondary to albuterol and respiratory distress, but sepsis must be considered. As she has a low-grade fever and productive cough, cover for community-acquired pneumonia.  Will sign out to oncoming ER physician to follow-up on results, anticipate admission.  Final Clinical Impressions(s) / ED Diagnoses   Final diagnoses:  COPD exacerbation [facility])    New Prescriptions New Prescriptions   No medications on file     Orpah Greek, MD 09/10/17 1941    Orpah Greek, MD 09/10/17 [id number]

## 2017-09-10 NOTE — ED Provider Notes (Signed)
4:01 PM Assumed care from Dr. Betsey Holiday, please see their note for full history, physical and decision making until this point. In brief this is a 71 y.o. year old female who presented to the ED tonight with Shortness of Breath     History of COPD here with shortness of breath. On exam exam patient is tachypneic with a rate near 40 not able to complete senten with moderate respiratory distr I suspect this is likely related to bronchodilation with her continuous albuterol and she is getting. We'll continue to reevaluate however patient may need BiPAP if she continues at this level respiratory distress.  On reevaluation, still with persistent tachypnea, BiPaP started.   Worsening lactic acid, no hypotension but will add on more fluids. Antibiotics already given. Likely sepsis but no current e/o shock end organ damage. MS appropriate. Will continue BiPaP.   Reevaluation, doing well, discussed with CCM who will admit 2/2 WOB and rising lactic acid.   CRITICAL CARE Performed by: Merrily Pew Total critical care time: 35 minutes Critical care time was exclusive of separately billable procedures and treating other patients. Critical care was necessary to treat or prevent imminent or life-threatening deterioration. Critical care was time spent personally by me on the following activities: development of treatment plan with patient and/or surrogate as well as nursing, discussions with consultants, evaluation of patient's response to treatment, examination of patient, obtaining history from patient or surrogate, ordering and performing treatments and interventions, ordering and review of laboratory studies, ordering and review of radiographic studies, pulse oximetry and re-evaluation of patient's condition.   Labs, studies and imaging reviewed by myself and considered in medical decision making if ordered. Imaging interpreted by radiology.  Labs Reviewed  COMPREHENSIVE METABOLIC PANEL - Abnormal; Notable  for the following:       Result Value   Glucose, Bld 136 (*)    BUN 5 (*)    All other components within normal limits  URINALYSIS, ROUTINE W REFLEX MICROSCOPIC - Abnormal; Notable for the following:    Color, Urine STRAW (*)    Glucose, UA 50 (*)    Ketones, ur 5 (*)    Leukocytes, UA TRACE (*)    Bacteria, UA RARE (*)    Squamous Epithelial / LPF 0-5 (*)    All other components within normal limits  LACTIC ACID, PLASMA - Abnormal; Notable for the following:    Lactic Acid, Venous 6.9 (*)    All other components within normal limits  GLUCOSE, CAPILLARY - Abnormal; Notable for the following:    Glucose-Capillary 189 (*)    All other components within normal limits  GLUCOSE, CAPILLARY - Abnormal; Notable for the following:    Glucose-Capillary 176 (*)    All other components within normal limits  I-STAT CG4 LACTIC ACID, ED - Abnormal; Notable for the following:    Lactic Acid, Venous 2.63 (*)    All other components within normal limits  I-STAT CG4 LACTIC ACID, ED - Abnormal; Notable for the following:    Lactic Acid, Venous 5.50 (*)    All other components within normal limits  I-STAT VENOUS BLOOD GAS, ED - Abnormal; Notable for the following:    pCO2, Ven 41.5 (*)    pO2, Ven 57.0 (*)    Bicarbonate 19.1 (*)    TCO2 20 (*)    Acid-base deficit 7.0 (*)    All other components within normal limits  I-STAT ARTERIAL BLOOD GAS, ED - Abnormal; Notable for the following:  pH, Arterial 7.197 (*)    pO2, Arterial 60.0 (*)    Bicarbonate 16.6 (*)    TCO2 18 (*)    Acid-base deficit 11.0 (*)    All other components within normal limits  POCT I-STAT 3, ART BLOOD GAS (G3+) - Abnormal; Notable for the following:    pH, Arterial 7.290 (*)    pCO2 arterial 31.6 (*)    Bicarbonate 15.2 (*)    TCO2 16 (*)    Acid-base deficit 10.0 (*)    All other components within normal limits  MRSA PCR SCREENING  CULTURE, BLOOD (ROUTINE X 2)  CULTURE, BLOOD (ROUTINE X 2)  CBC WITH  DIFFERENTIAL/PLATELET  CBC  CREATININE, SERUM  APTT  PROTIME-INR  PROCALCITONIN  INFLUENZA PANEL BY PCR (TYPE A & B)  BLOOD GAS, VENOUS  LACTIC ACID, PLASMA  LEGIONELLA PNEUMOPHILA SEROGP 1 UR AG  STREP PNEUMONIAE URINARY ANTIGEN  BLOOD GAS, ARTERIAL  BLOOD GAS, ARTERIAL  I-STAT TROPONIN, ED    CT ANGIO CHEST PE W OR WO CONTRAST  Final Result    DG Chest Port 1 View  Final Result    DG Chest Port 1 View    (Results Pending)    No Follow-up on file.    Merrily Pew, MD 09/10/17 [phone]

## 2017-09-10 NOTE — ED Notes (Signed)
Pt to CT then to 42M via stretcher with monitor, RN RT and Bipap machine.

## 2017-09-10 NOTE — Progress Notes (Signed)
Patient taken off of bipap and placed on 2L nasal cannula.  Sats currently 96%.  Vitals are stable.  Will continue to monitor.

## 2017-09-10 NOTE — ED Notes (Signed)
Pt up to bedpan and becomes very short of breath. lceaned of urine and pure wick applied after explanation of same to patient.

## 2017-09-10 NOTE — ED Notes (Signed)
Pt coughing up clear phlegm. Sits upright on bed and becomes aggitated trying to remove mask to spit. Mask removed and pt clears mouth but  begins breathing 50+ times a minute and coughing. [place] applied at 6 l and Dr. Dayna Barker called to bedside... Respiratory at bedside. Pt calms , respiratory rate improves to 30+ and she agrees to return to bipap as she feels she was improved un til she had to spit,.

## 2017-09-10 NOTE — H&P (Signed)
PULMONARY / CRITICAL CARE MEDICINE   Name: [name] MRN: [id number] DOB: September 11, 1946    ADMISSION DATE:  09/10/2017  REFERRING MD:  EDP   CHIEF COMPLAINT:  Respiratory failure   HISTORY OF PRESENT ILLNESS:   71yo female former smoker (quit 2003) with hx HTN, DM, Gold II COPD and chronic cough followed by Dr. [name] presented 9/12 with c/o SOB, cough, back pain.    Recent travel - just flew to [place].    PAST MEDICAL HISTORY :  She  has a past medical history of Diabetes ([place]); High cholesterol; and Hypertension.  PAST SURGICAL HISTORY: She  has a past surgical history that includes Colonoscopy (2014); carpel tunnel surgery (2006); endoscopy (2014); Abdominal hysterectomy (1995); Colostomy (2007); and colostomy let down (2008).  Allergies  Allergen Reactions  . Other Itching    HAIR DYE    No current facility-administered medications on file prior to encounter.    Current Outpatient Prescriptions on File Prior to Encounter  Medication Sig  . aspirin 81 MG tablet Take 81 mg by mouth daily.   . benzonatate (TESSALON) 200 MG capsule TAKE 1 CAPSULE (200 MG TOTAL) BY MOUTH 3 (THREE) TIMES DAILY AS NEEDED FOR COUGH.  . Biotin 2500 MCG CAPS Take 1 capsule by mouth daily.  . bisoprolol (ZEBETA) 5 MG tablet TAKE 1/2 TAB BY MOUTH DAILY (Patient taking differently: TAKE 2.5 mg BY MOUTH DAILY)  . budesonide-formoterol (SYMBICORT) 80-4.5 MCG/ACT inhaler Take 2 puffs first thing in am and then another 2 puffs about 12 hours later.  Marland Kitchen CALCIUM PO Take 1 tablet by mouth daily.  . chlorpheniramine (CHLOR-TRIMETON) 4 MG tablet Take 4 mg by mouth 2 (two) times daily. Add 1 every 4 hours as needed for drainage, throat clearing  . Cholecalciferol (VITAMIN D3) 2000 UNITS TABS Take 2,000 Units by mouth daily.  . famotidine (PEPCID) 20 MG tablet TAKE 1 TABLET (20 MG TOTAL) BY MOUTH AT BEDTIME.  Marland Kitchen Liraglutide (VICTOZA) 18 MG/3ML SOPN Inject 18 mg into the skin at bedtime.   Marland Kitchen LYRICA 75 MG capsule Take  75 mg by mouth daily as needed (for pain).   . montelukast (SINGULAIR) 10 MG tablet TAKE 1 TABLET (10 MG TOTAL) BY MOUTH AT BEDTIME.  . montelukast (SINGULAIR) 10 MG tablet TAKE 1 TABLET (10 MG TOTAL) BY MOUTH AT BEDTIME.  Marland Kitchen oxyCODONE (ROXICODONE) 5 MG immediate release tablet Take 1 tablet (5 mg total) by mouth every 4 (four) hours as needed for severe pain.  . pantoprazole (PROTONIX) 40 MG tablet Take 1 tablet (40 mg total) by mouth daily. Take 30-60 min before first meal of the day  . rosuvastatin (CRESTOR) 20 MG tablet Take 20 mg by mouth every evening.   . vitamin C (ASCORBIC ACID) 500 MG tablet Take 500 mg by mouth daily.  Marland Kitchen albuterol (PROVENTIL HFA;VENTOLIN HFA) 108 (90 Base) MCG/ACT inhaler Inhale 1-2 puffs into the lungs every 6 (six) hours as needed for wheezing or shortness of breath.  . predniSONE (DELTASONE) 10 MG tablet Take  4 each am x 2 days,   2 each am x 2 days,  1 each am x 2 days and stop    FAMILY HISTORY:  Her indicated that her mother is deceased. She indicated that her father is deceased. She indicated that three of her four brothers are alive.    SOCIAL HISTORY: She  reports that she quit smoking about 11 years ago. Her smoking use included Cigarettes. She has a 10.50 pack-year  smoking history. She has never used smokeless tobacco. She reports that she does not drink alcohol.  REVIEW OF SYSTEMS:   As per HPI - All other systems reviewed and were neg.    SUBJECTIVE:    VITAL SIGNS: BP (!) 117/52   Pulse (!) 111   Temp 100 F (37.8 C) (Oral)   Resp (!) 26   Ht 5\' 5"  (1.651 m)   Wt 83.5 kg (184 lb)   SpO2 99%   BMI 30.62 kg/m   HEMODYNAMICS:    VENTILATOR SETTINGS: FiO2 (%):  [30 %] 30 %  INTAKE / OUTPUT: No intake/output data recorded.  PHYSICAL EXAMINATION: General:  Chronically ill appearing female, NAD on bipap  Neuro:  Awake, alert  HEENT:  Mm dry, no JVD  Cardiovascular:  s1s2  Lungs:  resps even, mildly labored, scattered rhonchi, cough   Abdomen:  Round, soft, non tender  Musculoskeletal:  Warm and dry, no sig edema    LABS:  BMET  Recent Labs Lab 09/10/17 0636  NA 136  K 3.9  CL 103  CO2 23  BUN 5*  CREATININE 0.85  GLUCOSE 136*    Electrolytes  Recent Labs Lab 09/10/17 0636  CALCIUM 9.2    CBC  Recent Labs Lab 09/10/17 0636  WBC 6.8  HGB 14.5  HCT 42.5  PLT 303    Coag's No results for input(s): APTT, INR in the last 168 hours.  Sepsis Markers  Recent Labs Lab 09/10/17 0656 09/10/17 0932  LATICACIDVEN 2.63* 5.50*    ABG No results for input(s): PHART, PCO2ART, PO2ART in the last 168 hours.  Liver Enzymes  Recent Labs Lab 09/10/17 0636  AST 32  ALT 15  ALKPHOS 55  BILITOT 1.0  ALBUMIN 4.1    Cardiac Enzymes No results for input(s): TROPONINI, PROBNP in the last 168 hours.  Glucose No results for input(s): GLUCAP in the last 168 hours.  Imaging Dg Chest Port 1 View  Result Date: 09/10/2017 CLINICAL DATA:  Cough and shortness of Breath EXAM: PORTABLE CHEST 1 VIEW COMPARISON:  04/14/2017 FINDINGS: Cardiac shadow is within normal limits. Aortic calcifications are again seen. The lungs are well aerated bilaterally with chronic blunting of left costophrenic angle. Mild increased density is noted in the left base which may be in part due to overlying soft tissue although early infiltrate cannot be excluded. No bony abnormality is noted. IMPRESSION: Increased density in the left base as described. This likely represents a combination overlying soft tissues and early infiltrate. Electronically Signed   By: Inez Catalina M.D.   On: 09/10/2017 07:49     STUDIES:  CTA chest 9/12>>>  CULTURES: BC x 2 9/12>>>  Sputum 9/12>>>  ANTIBIOTICS: Rocephin 9/12>>> Azithro 9/12>>>  SIGNIFICANT EVENTS:   LINES/TUBES:   DISCUSSION: 71yo female with hx COPD, chronic cough, recent flight to [place] presented 9/12 with cough, SOB, back pain.  Admitting with acute respiratory failure  most likely r/t AECOPD and LLL CAP but also need to r/o PE.   ASSESSMENT / PLAN:  PULMONARY Acute respiratory failure r/t AECOPD +/- CAP Gold II COPD - Spirometry 04/14/2017  FEV1 1.13 (58%) Mixed acidosis  P:   bipap now  Repeat ABG 1 hour  F/u CXR  Solumedrol 80mg  IV q6h  BD's - duonebs Rocephin/ azithro as above for CAP coverage  Pulmonary hygiene  CTA chest to r/o PE   CARDIOVASCULAR SIRS/sepsis  R/o PE  P:  30cc/kg fluid resuscitation  F/u lactate  Trend  troponin  CTA chest as above   RENAL Mixed acidosis   P:   Trend lactate  Volume resuscitation as above  F/u chem   GASTROINTESTINAL GERD  P:   pepcid and protonix as per home regimen  NPO for now for bipap needs   HEMATOLOGIC No active issue  P:  SQ heparin   INFECTIOUS CAP - LLL  P:   Blood, sputum cultures  abx as above - rocephin azithro for CAP coverage  F/u pct, lactate    ENDOCRINE DM  P:   SSI   NEUROLOGIC Chronic pain  P:   PRN oxy    FAMILY  - Updates: dr Titus Mould discussed with pt at bedside 9/12  - Inter-disciplinary family meet or Palliative Care meeting due by:  Celesta Aver, NP 09/10/2017  11:42 AM Pager: [phone] or [phone]  STAFF NOTE: Linwood Dibbles, MD FACP have personally reviewed patient's available data, including medical history, events of note, physical examination and test results as part of my evaluation. I have discussed with resident/NP and other care providers such as pharmacist, RN and RRT. In addition, I personally evaluated patient and elicited key findings of: awake, alert, on NIMV with min distress, wheezing neck and chest , JVD evident, abdo soft, no calf redness or pain or edema, she describes just getting off plane Sunday and experiencing back pain and chest pain, then subsequent SOB, maybe mild fevers, PCXR I reviewed showed int changes and LLL hazziness, first concern is to rule out PE given presentation, get STAT CT  chest angio, she requires steroids, Bders, ABX for CAp to continued, sepsis protocol started, her lactic is likely related to combination sepsis / work of breathing, I am concerned she has underlying VCD with her cough, need to repeat abg, maintain scheduled NIMV, follow up LActic, more volume, I updated pt in full, NPO as may need ett The patient is critically ill with multiple organ systems failure and requires high complexity decision making for assessment and support, frequent evaluation and titration of therapies, application of advanced monitoring technologies and extensive interpretation of multiple databases.   Critical Care Time devoted to patient care services described in this note is 45 Minutes. This time reflects time of care of this signee: Merrie Roof, MD FACP. This critical care time does not reflect procedure time, or teaching time or supervisory time of PA/NP/Med student/Med Resident etc but could involve care discussion time. Rest per NP/medical resident whose note is outlined above and that I agree with   Lavon Paganini. Titus Mould, MD, [place] Pgr: [facility] 09/10/2017 3:01 PM

## 2017-09-10 NOTE — ED Notes (Signed)
Pt refuses nasal swab.

## 2017-09-10 NOTE — Progress Notes (Signed)
   Patient very hungry and asking for jello, popsicles  Cam care stable but for mild tachypnea Plans for bipap +  Plan Ice chips, clear liquis, occ soft solids, meds ok  Dr. Brand Males, M.D., [facility].C.P Pulmonary and Critical Care Medicine Staff Physician [facility] Pager: [phone], If no answer or between  15:00h - 7:00h: call [phone]  09/10/2017 9:56 PM

## 2017-09-10 NOTE — ED Triage Notes (Signed)
Pt brought to ED by [facility] from home for SOB, persistent cough, bilateral shoulder blade pain, SPO2 86% on RA, neb treatment given by EMS SPO2 100%. VS BP 146/74, HR 105, SPO2 100% on Neb treatment cbg 104.

## 2017-09-10 NOTE — ED Notes (Signed)
10 mg Albuterol, 1.0 Atrovent and 125 mg Solumedrol given by EMB pta.

## 2017-09-11 ENCOUNTER — Encounter (HOSPITAL_COMMUNITY): Payer: Self-pay | Admitting: *Deleted

## 2017-09-11 ENCOUNTER — Inpatient Hospital Stay (HOSPITAL_COMMUNITY): Payer: Medicare Other

## 2017-09-11 LAB — BLOOD GAS, ARTERIAL
Acid-base deficit: 3.5 mmol/L — ABNORMAL HIGH (ref 0.0–2.0)
Bicarbonate: 21.8 mmol/L (ref 20.0–28.0)
Delivery systems: POSITIVE
Drawn by: 345601
Expiratory PAP: 6
FIO2: 30
Inspiratory PAP: 15
O2 Saturation: 91.1 %
Patient temperature: 98.6
RATE: 8 resp/min
pCO2 arterial: 44.7 mmHg (ref 32.0–48.0)
pH, Arterial: 7.309 — ABNORMAL LOW (ref 7.350–7.450)
pO2, Arterial: 65.5 mmHg — ABNORMAL LOW (ref 83.0–108.0)

## 2017-09-11 LAB — GLUCOSE, CAPILLARY
Glucose-Capillary: 144 mg/dL — ABNORMAL HIGH (ref 65–99)
Glucose-Capillary: 173 mg/dL — ABNORMAL HIGH (ref 65–99)
Glucose-Capillary: 134 mg/dL — ABNORMAL HIGH (ref 65–99)
Glucose-Capillary: 206 mg/dL — ABNORMAL HIGH (ref 65–99)
Glucose-Capillary: 172 mg/dL — ABNORMAL HIGH (ref 65–99)

## 2017-09-11 LAB — PROCALCITONIN: Procalcitonin: 0.14 ng/mL

## 2017-09-11 LAB — BASIC METABOLIC PANEL
Anion gap: 7 (ref 5–15)
BUN: <5 mg/dL — ABNORMAL LOW (ref 6–20)
CO2: 21 mmol/L — ABNORMAL LOW (ref 22–32)
Calcium: 8.6 mg/dL — ABNORMAL LOW (ref 8.9–10.3)
Chloride: 111 mmol/L (ref 101–111)
Creatinine, Ser: 0.6 mg/dL (ref 0.44–1.00)
GFR calc Af Amer: >60 mL/min (ref >60)
GFR calc non Af Amer: >60 mL/min (ref >60)
Glucose, Bld: 129 mg/dL — ABNORMAL HIGH (ref 65–99)
Potassium: 3.5 mmol/L (ref 3.5–5.1)
Sodium: 139 mmol/L (ref 135–145)

## 2017-09-11 LAB — CBC
HCT: 35.2 % — ABNORMAL LOW (ref 36.0–46.0)
Hemoglobin: 12.1 g/dL (ref 12.0–15.0)
MCH: 30.1 pg (ref 26.0–34.0)
MCHC: 34.4 g/dL (ref 30.0–36.0)
MCV: 87.6 fL (ref 78.0–100.0)
Platelets: 307 10*3/uL (ref 150–400)
RBC: 4.02 MIL/uL (ref 3.87–5.11)
RDW: 13.9 % (ref 11.5–15.5)
WBC: 6.4 10*3/uL (ref 4.0–10.5)

## 2017-09-11 LAB — PHOSPHORUS: Phosphorus: 2.1 mg/dL — ABNORMAL LOW (ref 2.5–4.6)

## 2017-09-11 LAB — MAGNESIUM: Magnesium: 2 mg/dL (ref 1.7–2.4)

## 2017-09-11 MED ORDER — PANTOPRAZOLE SODIUM 40 MG PO TBEC
40.0000 mg | DELAYED_RELEASE_TABLET | Freq: Every day | ORAL | Status: DC
  Administered 2017-09-11 – 2017-09-14 (×4): 40 mg via ORAL
  Filled 2017-09-11 (×4): qty 1

## 2017-09-11 MED ORDER — IPRATROPIUM-ALBUTEROL 0.5-2.5 (3) MG/3ML IN SOLN
3.0000 mL | RESPIRATORY_TRACT | Status: DC | PRN
  Administered 2017-09-11 – 2017-09-12 (×5): 3 mL via RESPIRATORY_TRACT
  Filled 2017-09-11 (×4): qty 3

## 2017-09-11 MED ORDER — ROSUVASTATIN CALCIUM 10 MG PO TABS
20.0000 mg | ORAL_TABLET | Freq: Every day | ORAL | Status: DC
  Administered 2017-09-11: 20 mg via ORAL
  Filled 2017-09-11 (×2): qty 1

## 2017-09-11 MED ORDER — ASPIRIN EC 81 MG PO TBEC
81.0000 mg | DELAYED_RELEASE_TABLET | Freq: Every day | ORAL | Status: DC
  Administered 2017-09-11 – 2017-09-14 (×4): 81 mg via ORAL
  Filled 2017-09-11 (×4): qty 1

## 2017-09-11 MED ORDER — MONTELUKAST SODIUM 10 MG PO TABS
10.0000 mg | ORAL_TABLET | Freq: Every day | ORAL | Status: DC
  Administered 2017-09-11 – 2017-09-13 (×3): 10 mg via ORAL
  Filled 2017-09-11 (×4): qty 1

## 2017-09-11 MED ORDER — BUDESONIDE 0.5 MG/2ML IN SUSP
0.5000 mg | Freq: Two times a day (BID) | RESPIRATORY_TRACT | Status: DC
  Administered 2017-09-12 – 2017-09-14 (×5): 0.5 mg via RESPIRATORY_TRACT
  Filled 2017-09-11 (×7): qty 2

## 2017-09-11 MED ORDER — ARFORMOTEROL TARTRATE 15 MCG/2ML IN NEBU
15.0000 ug | INHALATION_SOLUTION | Freq: Two times a day (BID) | RESPIRATORY_TRACT | Status: DC
  Administered 2017-09-12 – 2017-09-14 (×5): 15 ug via RESPIRATORY_TRACT
  Filled 2017-09-11 (×7): qty 2

## 2017-09-11 MED ORDER — IPRATROPIUM-ALBUTEROL 0.5-2.5 (3) MG/3ML IN SOLN
3.0000 mL | RESPIRATORY_TRACT | Status: DC
  Administered 2017-09-11 (×2): 3 mL via RESPIRATORY_TRACT
  Filled 2017-09-11 (×4): qty 3

## 2017-09-11 MED ORDER — BENZONATATE 100 MG PO CAPS
200.0000 mg | ORAL_CAPSULE | Freq: Three times a day (TID) | ORAL | Status: DC | PRN
  Administered 2017-09-11 – 2017-09-14 (×7): 200 mg via ORAL
  Filled 2017-09-11 (×8): qty 2

## 2017-09-11 MED ORDER — BISOPROLOL FUMARATE 5 MG PO TABS
2.5000 mg | ORAL_TABLET | Freq: Every day | ORAL | Status: DC
  Administered 2017-09-11 – 2017-09-14 (×4): 2.5 mg via ORAL
  Filled 2017-09-11 (×4): qty 0.5

## 2017-09-11 NOTE — Care Management Note (Signed)
Case Management Note  Patient Details  Name: [name] MRN: [id number] Date of Birth: [birth year]  Subjective/Objective:    Pt admitted with SOB                 Action/Plan:  PTA independent from home.  Pt states she has PCP and denied barriers to obtaining or paying for medications.  CM will continue to follow for discharge needs   Expected Discharge Date:                  Expected Discharge Plan:  Home/Self Care  In-House Referral:     Discharge planning Services  CM Consult  Post Acute Care Choice:    Choice offered to:     DME Arranged:    DME Agency:     HH Arranged:    HH Agency:     Status of Service:     If discussed at [employer] of Stay Meetings, dates discussed:    Additional Comments:  Maryclare Labrador, RN 09/11/2017, 3:41 PM

## 2017-09-11 NOTE — Progress Notes (Signed)
PULMONARY / CRITICAL CARE MEDICINE   Name: [name] MRN: [id number] DOB: 03-09-1946    ADMISSION DATE:  09/10/2017  REFERRING MD:  EDP   CHIEF COMPLAINT:  Respiratory failure   HISTORY OF PRESENT ILLNESS:   71yo female former smoker (quit 2003) with hx HTN, DM, Gold II COPD and chronic cough followed by Dr. [name] presented 9/12 with c/o SOB, cough, back pain.   Recent travel - just flew to [place].  PE ruled out on CTA  As per the patient and the symptoms came on suddenly with no preceding cough, sputum production, prodrome, fevers or chills.  PAST MEDICAL HISTORY :  She  has a past medical history of Diabetes ([place]); High cholesterol; and Hypertension.  PAST SURGICAL HISTORY: She  has a past surgical history that includes Colonoscopy (2014); carpel tunnel surgery (2006); endoscopy (2014); Abdominal hysterectomy (1995); Colostomy (2007); and colostomy let down (2008).  Allergies  Allergen Reactions  . Other Itching    HAIR DYE    No current facility-administered medications on file prior to encounter.    Current Outpatient Prescriptions on File Prior to Encounter  Medication Sig  . aspirin 81 MG tablet Take 81 mg by mouth daily.   . benzonatate (TESSALON) 200 MG capsule TAKE 1 CAPSULE (200 MG TOTAL) BY MOUTH 3 (THREE) TIMES DAILY AS NEEDED FOR COUGH.  . Biotin 2500 MCG CAPS Take 1 capsule by mouth daily.  . bisoprolol (ZEBETA) 5 MG tablet TAKE 1/2 TAB BY MOUTH DAILY (Patient taking differently: TAKE 2.5 mg BY MOUTH DAILY)  . budesonide-formoterol (SYMBICORT) 80-4.5 MCG/ACT inhaler Take 2 puffs first thing in am and then another 2 puffs about 12 hours later.  Marland Kitchen CALCIUM PO Take 1 tablet by mouth daily.  . chlorpheniramine (CHLOR-TRIMETON) 4 MG tablet Take 4 mg by mouth 2 (two) times daily. Add 1 every 4 hours as needed for drainage, throat clearing  . Cholecalciferol (VITAMIN D3) 2000 UNITS TABS Take 2,000 Units by mouth daily.  . famotidine (PEPCID) 20 MG tablet TAKE 1 TABLET  (20 MG TOTAL) BY MOUTH AT BEDTIME.  Marland Kitchen Liraglutide (VICTOZA) 18 MG/3ML SOPN Inject 18 mg into the skin at bedtime.   Marland Kitchen LYRICA 75 MG capsule Take 75 mg by mouth daily as needed (for pain).   . montelukast (SINGULAIR) 10 MG tablet TAKE 1 TABLET (10 MG TOTAL) BY MOUTH AT BEDTIME.  . montelukast (SINGULAIR) 10 MG tablet TAKE 1 TABLET (10 MG TOTAL) BY MOUTH AT BEDTIME.  Marland Kitchen oxyCODONE (ROXICODONE) 5 MG immediate release tablet Take 1 tablet (5 mg total) by mouth every 4 (four) hours as needed for severe pain.  . pantoprazole (PROTONIX) 40 MG tablet Take 1 tablet (40 mg total) by mouth daily. Take 30-60 min before first meal of the day  . rosuvastatin (CRESTOR) 20 MG tablet Take 20 mg by mouth every evening.   . vitamin C (ASCORBIC ACID) 500 MG tablet Take 500 mg by mouth daily.  Marland Kitchen albuterol (PROVENTIL HFA;VENTOLIN HFA) 108 (90 Base) MCG/ACT inhaler Inhale 1-2 puffs into the lungs every 6 (six) hours as needed for wheezing or shortness of breath.  . predniSONE (DELTASONE) 10 MG tablet Take  4 each am x 2 days,   2 each am x 2 days,  1 each am x 2 days and stop    FAMILY HISTORY:  Her indicated that her mother is deceased. She indicated that her father is deceased. She indicated that three of her four brothers are alive.  SOCIAL HISTORY: She  reports that she quit smoking about 11 years ago. Her smoking use included Cigarettes. She has a 10.50 pack-year smoking history. She has never used smokeless tobacco. She reports that she does not drink alcohol.  REVIEW OF SYSTEMS:   As per HPI - All other systems reviewed and were neg.   SUBJECTIVE:  Stable Off Bipap  VITAL SIGNS: BP (!) 155/84   Pulse (!) 109   Temp 97.9 F (36.6 C) (Axillary)   Resp (!) 25   Ht 5\' 5"  (1.651 m)   Wt 183 lb 13.8 oz (83.4 kg)   SpO2 93%   BMI 30.60 kg/m   HEMODYNAMICS:    VENTILATOR SETTINGS: FiO2 (%):  [30 %] 30 %  INTAKE / OUTPUT: I/O last 3 completed shifts: In: 1534.6 [I.V.:1534.6] Out: 700  [Urine:700]  PHYSICAL EXAMINATION: Gen:      No acute distress HEENT:  EOMI, sclera anicteric Neck:     No masses; no thyromegaly Lungs:    Mild exp wheeze; normal respiratory effort CV:         Regular rate and rhythm; no murmurs Abd:      + bowel sounds; soft, non-tender; no palpable masses, no distension Ext:    No edema; adequate peripheral perfusion Skin:      Warm and dry; no rash Neuro: alert and oriented x 3 Psych: normal mood and affect  LABS:  BMET  Recent Labs Lab 09/10/17 0636 09/10/17 1333 09/11/17 0136  NA 136  --  139  K 3.9  --  3.5  CL 103  --  111  CO2 23  --  21*  BUN 5*  --  <5*  CREATININE 0.85 0.93 0.60  GLUCOSE 136*  --  129*    Electrolytes  Recent Labs Lab 09/10/17 0636 09/11/17 0136  CALCIUM 9.2 8.6*  MG  --  2.0  PHOS  --  2.1*    CBC  Recent Labs Lab 09/10/17 0636 09/10/17 1333 09/11/17 0136  WBC 6.8 4.7 6.4  HGB 14.5 12.9 12.1  HCT 42.5 38.7 35.2*  PLT 303 309 307    Coag's  Recent Labs Lab 09/10/17 1333  APTT 25  INR 1.09    Sepsis Markers  Recent Labs Lab 09/10/17 0932 09/10/17 1333 09/10/17 1830 09/11/17 0136  LATICACIDVEN 5.50* 6.9* 3.9*  --   PROCALCITON  --  0.11  --  0.14    ABG  Recent Labs Lab 09/10/17 1144 09/10/17 1516 09/11/17 0428  PHART 7.197* 7.290* 7.309*  PCO2ART 43.0 31.6* 44.7  PO2ART 60.0* 101.0 65.5*    Liver Enzymes  Recent Labs Lab 09/10/17 0636  AST 32  ALT 15  ALKPHOS 55  BILITOT 1.0  ALBUMIN 4.1    Cardiac Enzymes No results for input(s): TROPONINI, PROBNP in the last 168 hours.  Glucose  Recent Labs Lab 09/10/17 1539 09/10/17 1938 09/10/17 2316 09/11/17 0345 09/11/17 0757 09/11/17 1121  GLUCAP 176* 164* 166* 144* 173* 134*    Imaging Ct Angio Chest Pe W Or Wo Contrast  Result Date: 09/10/2017 CLINICAL DATA:  Shortness of breath and persistent cough. EXAM: CT ANGIOGRAPHY CHEST WITH CONTRAST TECHNIQUE: Multidetector CT imaging of the chest was  performed using the standard protocol during bolus administration of intravenous contrast. Multiplanar CT image reconstructions and MIPs were obtained to evaluate the vascular anatomy. CONTRAST:  100 cc Isovue 370 COMPARISON:  03/09/2014 FINDINGS: Cardiovascular: The heart is normal in size. No pericardial effusion. Moderate tortuosity, ectasia and  calcification of the thoracic aorta and branch vessels including the coronary arteries. No aortic aneurysm or dissection. The pulmonary arterial tree is well opacified. No filling defects to suggest pulmonary embolism. Mediastinum/Nodes: No mediastinal or hilar mass or lymphadenopathy. The esophagus is grossly normal. Lungs/Pleura: No significant pulmonary findings. Left basilar scarring changes are stable. Mild eventration of the left hemidiaphragm. No worrisome pulmonary lesions or acute pulmonary findings. Ache ground-glass nodule in the right middle lobe adjacent to the minor fissure, best seen on series 6, image 81 and series 9, image 55. This appears stable when compared to the prior examination of 2015. It measures a maximum of 7 mm and I would recommend a followup noncontrast chest CT scan 1-2 years to document 5 years of stability. Upper Abdomen: No significant upper abdominal findings. Moderate abdominal aortic and branch vessel calcifications. Musculoskeletal: No breast masses, supraclavicular or axillary lymphadenopathy. The thyroid gland is grossly normal. No significant bony findings. Review of the MIP images confirms the above findings. IMPRESSION: 1. No CT findings for pulmonary embolism. 2. Stable moderate atherosclerotic calcifications involving the abdominal and thoracic aorta and branch vessels including the coronary arteries. 3. No acute pulmonary findings. 4. 7.5 mm ground-glass nodule in the right middle lobe appears stable since 2015. Recommend follow-up noncontrast chest CT in 1-2 years to document 5 years of stability. Aortic Atherosclerosis  (ICD10-I70.0). Electronically Signed   By: Marijo Sanes M.D.   On: 09/10/2017 13:40   Dg Chest Port 1 View  Result Date: 09/11/2017 CLINICAL DATA:  Pneumonia. EXAM: PORTABLE CHEST 1 VIEW COMPARISON:  CT 09/10/2017.  Chest x-ray 09/10/2017, 09/28/2016. FINDINGS: Mediastinum hilar structures normal. Heart size stable. Lungs are clear of acute infiltrates. Left base pleural-parenchymal thickening again noted consistent with scarring . No pleural effusion or pneumothorax . IMPRESSION: Left base pleural-parenchymal thickening again noted consistent scarring. No acute cardiopulmonary disease. Electronically Signed   By: Marcello Moores  Register   On: 09/11/2017 06:41     STUDIES:  CTA chest 9/12>>> no pulmonary embolism. No acute pulmonary findings with no infiltrate or consolidation. 7.5 millimeter groundglass right middle lobe nodule.  CULTURES: BC x 2 9/12>>>  Sputum 9/12>>>  ANTIBIOTICS: Rocephin 9/12>>> 9/13 Azithro 9/12>>> 9/12  SIGNIFICANT EVENTS:  LINES/TUBES:  DISCUSSION: 71yo female with hx COPD, chronic cough, recent flight to [place] presented 9/12 with cough, SOB, back pain.  Admitting with acute respiratory failure most likely r/t AECOPD No PE. Low suspicion for infection as CT chest is normal and pct is low  ASSESSMENT / PLAN:  PULMONARY Acute respiratory failure r/t AECOPD Gold II COPD - Spirometry 04/14/2017  FEV1 1.13 (58%) Mixed acidosis  P:   Off bipap Wean down oxygen as tolerated Solumedrol 80mg  IV q6h. Continue at current dose  Continue duonebs  CARDIOVASCULAR SIRS/sepsis  R/o PE  P:  Do not suspect infection Repeat LA  RENAL Mixed acidosis   P:   Follow urine output and Cr  GASTROINTESTINAL GERD  P:   PO diet  HEMATOLOGIC No active issue  P:  SQ heparin   INFECTIOUS No evidence of pneumonia P:   Follow cultures Stop antibiotics  ENDOCRINE DM  P:   SSI   NEUROLOGIC Chronic pain  P:   PRN Oxy  FAMILY  - Updates:Patient updated at  bedside - Inter-disciplinary family meet or Palliative Care meeting due by:  Consuello Closs MD [facility] Pager [phone] If no answer or after 3pm call: [phone] 09/11/2017, 11:53 AM

## 2017-09-12 ENCOUNTER — Inpatient Hospital Stay (HOSPITAL_COMMUNITY): Payer: Medicare Other

## 2017-09-12 ENCOUNTER — Encounter (HOSPITAL_COMMUNITY): Payer: Self-pay | Admitting: General Practice

## 2017-09-12 LAB — CBC
HCT: 37 % (ref 36.0–46.0)
Hemoglobin: 12.6 g/dL (ref 12.0–15.0)
MCH: 29.9 pg (ref 26.0–34.0)
MCHC: 34.1 g/dL (ref 30.0–36.0)
MCV: 87.9 fL (ref 78.0–100.0)
Platelets: 339 10*3/uL (ref 150–400)
RBC: 4.21 MIL/uL (ref 3.87–5.11)
RDW: 14.3 % (ref 11.5–15.5)
WBC: 18.3 10*3/uL — ABNORMAL HIGH (ref 4.0–10.5)

## 2017-09-12 LAB — BASIC METABOLIC PANEL
Anion gap: 4 — ABNORMAL LOW (ref 5–15)
BUN: 11 mg/dL (ref 6–20)
CO2: 27 mmol/L (ref 22–32)
Calcium: 9 mg/dL (ref 8.9–10.3)
Chloride: 109 mmol/L (ref 101–111)
Creatinine, Ser: 0.76 mg/dL (ref 0.44–1.00)
GFR calc Af Amer: >60 mL/min (ref >60)
GFR calc non Af Amer: >60 mL/min (ref >60)
Glucose, Bld: 135 mg/dL — ABNORMAL HIGH (ref 65–99)
Potassium: 4.1 mmol/L (ref 3.5–5.1)
Sodium: 140 mmol/L (ref 135–145)

## 2017-09-12 LAB — GLUCOSE, CAPILLARY
Glucose-Capillary: 135 mg/dL — ABNORMAL HIGH (ref 65–99)
Glucose-Capillary: 152 mg/dL — ABNORMAL HIGH (ref 65–99)
Glucose-Capillary: 168 mg/dL — ABNORMAL HIGH (ref 65–99)
Glucose-Capillary: 169 mg/dL — ABNORMAL HIGH (ref 65–99)
Glucose-Capillary: 201 mg/dL — ABNORMAL HIGH (ref 65–99)
Glucose-Capillary: 209 mg/dL — ABNORMAL HIGH (ref 65–99)

## 2017-09-12 LAB — C DIFFICILE QUICK SCREEN W PCR REFLEX
C Diff antigen: NEGATIVE
C Diff interpretation: NOT DETECTED
C Diff toxin: NEGATIVE

## 2017-09-12 LAB — PHOSPHORUS: Phosphorus: 2.3 mg/dL — ABNORMAL LOW (ref 2.5–4.6)

## 2017-09-12 LAB — LEGIONELLA PNEUMOPHILA SEROGP 1 UR AG: L. pneumophila Serogp 1 Ur Ag: NEGATIVE

## 2017-09-12 LAB — MAGNESIUM: Magnesium: 2.3 mg/dL (ref 1.7–2.4)

## 2017-09-12 MED ORDER — LOPERAMIDE HCL 2 MG PO CAPS
2.0000 mg | ORAL_CAPSULE | Freq: Two times a day (BID) | ORAL | Status: DC | PRN
  Administered 2017-09-12 – 2017-09-13 (×2): 2 mg via ORAL
  Filled 2017-09-12 (×3): qty 1

## 2017-09-12 MED ORDER — ROSUVASTATIN CALCIUM 10 MG PO TABS
20.0000 mg | ORAL_TABLET | Freq: Every day | ORAL | Status: DC
  Administered 2017-09-12 – 2017-09-13 (×2): 20 mg via ORAL
  Filled 2017-09-12 (×2): qty 2

## 2017-09-12 MED ORDER — ROSUVASTATIN CALCIUM 10 MG PO TABS: 20.0000 mg | ORAL_TABLET | Freq: Every day | ORAL | Status: DC

## 2017-09-12 MED ORDER — HYDROCORTISONE 2.5 % RE CREA
TOPICAL_CREAM | Freq: Two times a day (BID) | RECTAL | Status: DC
  Administered 2017-09-12: 1 via RECTAL
  Administered 2017-09-12 – 2017-09-13 (×2): via RECTAL
  Administered 2017-09-13: 1 via RECTAL
  Filled 2017-09-12: qty 28.35

## 2017-09-12 MED ORDER — METHYLPREDNISOLONE SODIUM SUCC 125 MG IJ SOLR
80.0000 mg | Freq: Three times a day (TID) | INTRAMUSCULAR | Status: DC
  Administered 2017-09-12 – 2017-09-14 (×5): 80 mg via INTRAVENOUS
  Filled 2017-09-12 (×5): qty 2

## 2017-09-12 NOTE — Consult Note (Signed)
[facility] Primary Care Navigator  09/12/2017  [name] 1946-07-06 [id number]   Met withpatient at the bedside to identify possible discharge needs.  Patient stateshaving "difficulty breathing" and "chest tightness" for couple of days that had led to this admission. Patient endorses Dr. [name] with [place] as theprimary care provider.She mentioned that Dr. [name] is her pulmonologist.   [facility] [address] [facility].   Patient states managing her medications at home with use of "pill box" system filled weekly.   Patient verbalized that she was driving prior to admission. Husband [name]) will providetransportation toher doctors'appointments after discharge if needed.  Patient's husband will be her primary caregiver at home when needed.   Anticipateddischarge plan ishome according to patient.  Patient expressedunderstanding to call herprimary care provider's officewhen she gets home,for a post discharge follow-upwithin a week or sooner if needed.Patient letter (with PCP's contact number) was provided as a reminder. She also mentioned that she has an upcoming appointment to see pulmonologist (Dr. [name]) and states that she will talk to Dr. [name] regarding rescue medication/ breathing treatment needed at home.  Discussed withpatient regarding[facility] CM services available for health management (particularly COPD). Explained topatient about COPD action plan as well. Patient was reluctant to home visits but opted andverbally agreed only to[facility] COPDcalls to follow-up while she is recovering.   Referral was made for [facility] COPD[facility] after discharge.  Patient voicedunderstandingto seekreferral from primary care provider to [facility] care management if deemed necessaryfor further services in the future.  [facility] care management information provided for future needs that may  arise.   For questions, please contact:  Dannielle Huh, BSN, RN- [facility] Primary Care Navigator  Telephone: [phone] [place]

## 2017-09-12 NOTE — Progress Notes (Addendum)
PROGRESS NOTE    [name]  LEX:[id number] DOB: [birth year] DOA: 09/10/2017 PCP: [name], MD   Brief Narrative: 71 year old female with history of hypertension, diabetes, COPD, chronic cough follows up with Dr. [name] presented 9/12 with c/o SOB, cough, back pain.   patient was admitted by pulmonary critical care for COPD exacerbation. Transferred to [facility] on 9/14.  Assessment & Plan:   #Acute respiratory failure with hypoxia due to acute exacerbation of COPD: Patient is not hypoxic but is still having wheezing and shortness of breath, and uses with exertion. I reduced Solu-Medrol 80 mg 2 twice a day. Continue bronchodilators. Not requiring oxygen. Patient is off BiPAP. -CT chest negative for PE. -Patient has a stable right middle lobe lung nodule. Recommended outpatient follow-up. -pro-calcitonin  not elevated. -Continue Pepcid.  #Diabetes: Continue sliding scale. Monitor blood sugar level especially on steroid  #Hyperlipidemia: Continue statin.  # Leukocytosis due to steroid. Patient is afebrile.  Continue current medication.  DVT prophylaxis: Lovenox subcutaneous Code Status: Full code Family Communication: No family at bedside Disposition Plan: Currently admitted    Consultants:   Admitted by pulmonary critical care and transfer to [facility]  Procedures: None Antimicrobials: None  Subjective: Seen and examined at bedside. Denied headache, dizziness, nausea vomiting chest pain. Has shortness of breath especially with exertion.  Objective: Vitals:   09/11/17 1800 09/11/17 1846 09/12/17 0500 09/12/17 0935  BP: (!) 146/71 (!) 152/59 134/83   Pulse: 88 97 97   Resp: (!) 26 16 16    Temp:  97.8 F (36.6 C) 98.3 F (36.8 C)   TempSrc:  Oral Oral   SpO2: 97% 100% 93% 94%  Weight:      Height:        Intake/Output Summary (Last 24 hours) at 09/12/17 1313 Last data filed at 09/11/17 2100  Gross per 24 hour  Intake              260 ml  Output                0  ml  Net              260 ml   Filed Weights   09/10/17 0625 09/11/17 0500  Weight: 83.5 kg (184 lb) 83.4 kg (183 lb 13.8 oz)    Examination:  General exam: Appears calm and comfortable  Respiratory system: Diffuse bilateral wheezing, respiratory effort normal. Cardiovascular system: S1 & S2 heard, RRR.  No pedal edema. Gastrointestinal system: Abdomen is nondistended, soft and nontender. Normal bowel sounds heard. Central nervous system: Alert and oriented. No focal neurological deficits. Extremities: Symmetric 5 x 5 power. Skin: No rashes, lesions or ulcers Psychiatry: Judgement and insight appear normal. Mood & affect appropriate.     Data Reviewed: I have personally reviewed following labs and imaging studies  CBC:  Recent Labs Lab 09/10/17 0636 09/10/17 1333 09/11/17 0136 09/12/17 0416  WBC 6.8 4.7 6.4 18.3*  NEUTROABS 3.7  --   --   --   HGB 14.5 12.9 12.1 12.6  HCT 42.5 38.7 35.2* 37.0  MCV 88.0 89.0 87.6 87.9  PLT 303 309 307 449   Basic Metabolic Panel:  Recent Labs Lab 09/10/17 0636 09/10/17 1333 09/11/17 0136 09/12/17 0416  NA 136  --  139 140  K 3.9  --  3.5 4.1  CL 103  --  111 109  CO2 23  --  21* 27  GLUCOSE 136*  --  129* 135*  BUN 5*  --  <  5* 11  CREATININE 0.85 0.93 0.60 0.76  CALCIUM 9.2  --  8.6* 9.0  MG  --   --  2.0 2.3  PHOS  --   --  2.1* 2.3*   GFR: Estimated Creatinine Clearance: 70.8 mL/min (by C-G formula based on SCr of 0.76 mg/dL). Liver Function Tests:  Recent Labs Lab 09/10/17 0636  AST 32  ALT 15  ALKPHOS 55  BILITOT 1.0  PROT 7.9  ALBUMIN 4.1   No results for input(s): LIPASE, AMYLASE in the last 168 hours. No results for input(s): AMMONIA in the last 168 hours. Coagulation Profile:  Recent Labs Lab 09/10/17 1333  INR 1.09   Cardiac Enzymes: No results for input(s): CKTOTAL, CKMB, CKMBINDEX, TROPONINI in the last 168 hours. BNP (last 3 results) No results for input(s): PROBNP in the last 8760  hours. HbA1C: No results for input(s): HGBA1C in the last 72 hours. CBG:  Recent Labs Lab 09/11/17 1852 09/12/17 0005 09/12/17 0514 09/12/17 0823 09/12/17 1158  GLUCAP 172* 201* 135* 209* 152*   Lipid Profile: No results for input(s): CHOL, HDL, LDLCALC, TRIG, CHOLHDL, LDLDIRECT in the last 72 hours. Thyroid Function Tests: No results for input(s): TSH, T4TOTAL, FREET4, T3FREE, THYROIDAB in the last 72 hours. Anemia Panel: No results for input(s): VITAMINB12, FOLATE, FERRITIN, TIBC, IRON, RETICCTPCT in the last 72 hours. Sepsis Labs:  Recent Labs Lab 09/10/17 0656 09/10/17 0932 09/10/17 1333 09/10/17 1830 09/11/17 0136  PROCALCITON  --   --  0.11  --  0.14  LATICACIDVEN 2.63* 5.50* 6.9* 3.9*  --     Recent Results (from the past 240 hour(s))  Blood Culture (routine x 2)     Status: None (Preliminary result)   Collection Time: 09/10/17  6:41 AM  Result Value Ref Range Status   Specimen Description BLOOD RIGHT FOREARM  Final   Special Requests IN PEDIATRIC BOTTLE Blood Culture adequate volume  Final   Culture NO GROWTH 2 DAYS  Final   Report Status PENDING  Incomplete  Blood Culture (routine x 2)     Status: None (Preliminary result)   Collection Time: 09/10/17  6:48 AM  Result Value Ref Range Status   Specimen Description BLOOD LEFT ANTECUBITAL  Final   Special Requests   Final    BOTTLES DRAWN AEROBIC AND ANAEROBIC Blood Culture adequate volume   Culture NO GROWTH 2 DAYS  Final   Report Status PENDING  Incomplete  MRSA PCR Screening     Status: None   Collection Time: 09/10/17  1:42 PM  Result Value Ref Range Status   MRSA by PCR NEGATIVE NEGATIVE Final    Comment:        The GeneXpert MRSA Assay (FDA approved for NASAL specimens only), is one component of a comprehensive MRSA colonization surveillance program. It is not intended to diagnose MRSA infection nor to guide or monitor treatment for MRSA infections.          Radiology Studies: Dg Chest  Port 1 View  Result Date: 09/12/2017 CLINICAL DATA:  Acute respiratory failure EXAM: PORTABLE CHEST 1 VIEW COMPARISON:  Chest radiograph from one day prior. FINDINGS: Stable cardiomediastinal silhouette with normal heart size. No pneumothorax. No pleural effusion. No pulmonary edema. Stable blunting of the left costophrenic angle with associated mild hazy opacity compatible with scarring as seen on the recent chest CT. No acute consolidative airspace disease. IMPRESSION: Stable pleural-parenchymal scarring at the left lung base. No acute cardiopulmonary disease. Electronically Signed   By: Corene Cornea  A Poff M.D.   On: 09/12/2017 07:06   Dg Chest Port 1 View  Result Date: 09/11/2017 CLINICAL DATA:  Pneumonia. EXAM: PORTABLE CHEST 1 VIEW COMPARISON:  CT 09/10/2017.  Chest x-ray 09/10/2017, 09/28/2016. FINDINGS: Mediastinum hilar structures normal. Heart size stable. Lungs are clear of acute infiltrates. Left base pleural-parenchymal thickening again noted consistent with scarring . No pleural effusion or pneumothorax . IMPRESSION: Left base pleural-parenchymal thickening again noted consistent scarring. No acute cardiopulmonary disease. Electronically Signed   By: Marcello Moores  Register   On: 09/11/2017 06:41        Scheduled Meds: . arformoterol  15 mcg Nebulization BID  . aspirin EC  81 mg Oral Daily  . bisoprolol  2.5 mg Oral Daily  . budesonide (PULMICORT) nebulizer solution  0.5 mg Nebulization BID  . famotidine  20 mg Oral QHS  . heparin  5,000 Units Subcutaneous Q8H  . insulin aspart  0-9 Units Subcutaneous Q4H  . methylPREDNISolone (SOLU-MEDROL) injection  80 mg Intravenous Q6H  . montelukast  10 mg Oral QHS  . pantoprazole  40 mg Oral Daily  . rosuvastatin  20 mg Oral q1800   Continuous Infusions: . sodium chloride       LOS: 2 days    Ahmani Daoud Tanna Furry, MD [facility] Pager [phone]  If 7PM-7AM, please contact night-coverage [email or web address] Password [email or web address] 09/12/2017,  1:13 PM

## 2017-09-12 NOTE — Progress Notes (Signed)
Called Respiratory x4 to assess patient

## 2017-09-13 ENCOUNTER — Other Ambulatory Visit: Payer: Self-pay | Admitting: Internal Medicine

## 2017-09-13 DIAGNOSIS — R0602 Shortness of breath: Secondary | ICD-10-CM

## 2017-09-13 DIAGNOSIS — R197 Diarrhea, unspecified: Secondary | ICD-10-CM

## 2017-09-13 LAB — CBC
HCT: 36.8 % (ref 36.0–46.0)
Hemoglobin: 12.2 g/dL (ref 12.0–15.0)
MCH: 29 pg (ref 26.0–34.0)
MCHC: 33.2 g/dL (ref 30.0–36.0)
MCV: 87.6 fL (ref 78.0–100.0)
Platelets: 362 10*3/uL (ref 150–400)
RBC: 4.2 MIL/uL (ref 3.87–5.11)
RDW: 14.3 % (ref 11.5–15.5)
WBC: 15.1 10*3/uL — ABNORMAL HIGH (ref 4.0–10.5)

## 2017-09-13 LAB — GLUCOSE, CAPILLARY
Glucose-Capillary: 112 mg/dL — ABNORMAL HIGH (ref 65–99)
Glucose-Capillary: 147 mg/dL — ABNORMAL HIGH (ref 65–99)
Glucose-Capillary: 158 mg/dL — ABNORMAL HIGH (ref 65–99)
Glucose-Capillary: 169 mg/dL — ABNORMAL HIGH (ref 65–99)
Glucose-Capillary: 238 mg/dL — ABNORMAL HIGH (ref 65–99)
Glucose-Capillary: 257 mg/dL — ABNORMAL HIGH (ref 65–99)

## 2017-09-13 LAB — HEMOGLOBIN A1C
Hgb A1c MFr Bld: 6.1 % — ABNORMAL HIGH (ref 4.8–5.6)
Mean Plasma Glucose: 128.37 mg/dL

## 2017-09-13 MED ORDER — LIDOCAINE 4 % EX CREA
TOPICAL_CREAM | Freq: Three times a day (TID) | CUTANEOUS | Status: DC | PRN
  Administered 2017-09-13: 1 via TOPICAL
  Filled 2017-09-13: qty 5

## 2017-09-13 MED ORDER — INSULIN ASPART 100 UNIT/ML ~~LOC~~ SOLN
3.0000 [IU] | Freq: Three times a day (TID) | SUBCUTANEOUS | Status: DC
  Administered 2017-09-13 – 2017-09-14 (×3): 3 [IU] via SUBCUTANEOUS

## 2017-09-13 MED ORDER — ENOXAPARIN SODIUM 40 MG/0.4ML ~~LOC~~ SOLN
40.0000 mg | SUBCUTANEOUS | Status: DC
  Administered 2017-09-13: 40 mg via SUBCUTANEOUS
  Filled 2017-09-13: qty 0.4

## 2017-09-13 MED ORDER — ALBUTEROL SULFATE (2.5 MG/3ML) 0.083% IN NEBU: 2.5000 mg | INHALATION_SOLUTION | RESPIRATORY_TRACT | Status: DC | PRN

## 2017-09-13 MED ORDER — IPRATROPIUM-ALBUTEROL 0.5-2.5 (3) MG/3ML IN SOLN
3.0000 mL | Freq: Four times a day (QID) | RESPIRATORY_TRACT | Status: DC
  Administered 2017-09-13 (×2): 3 mL via RESPIRATORY_TRACT
  Filled 2017-09-13 (×3): qty 3

## 2017-09-13 MED ORDER — LOPERAMIDE HCL 2 MG PO CAPS: 2.0000 mg | ORAL_CAPSULE | Freq: Three times a day (TID) | ORAL | Status: DC | PRN

## 2017-09-13 MED ORDER — DM-GUAIFENESIN ER 30-600 MG PO TB12
1.0000 | ORAL_TABLET | Freq: Two times a day (BID) | ORAL | Status: DC
  Administered 2017-09-13 – 2017-09-14 (×3): 1 via ORAL
  Filled 2017-09-13 (×3): qty 1

## 2017-09-13 NOTE — Progress Notes (Addendum)
PROGRESS NOTE    [name]  OVF:[id number] DOB: 08/09/1946 DOA: 09/10/2017 PCP: [name], MD   Brief Narrative: 71 year old female with history of hypertension, diabetes, COPD, chronic cough follows up with Dr. [name] presented 9/12 with c/o SOB, cough, back pain.   patient was admitted by pulmonary critical care for COPD exacerbation. Transferred to [facility] on 9/14.  Assessment & Plan:   #Acute respiratory failure with hypoxia due to acute exacerbation of COPD: Patient is still with dyspnea on exertion and having difficulty completing sentences. Very slow improvement. Added Mucinex. Continue Solu-Medrol IV, DuoNeb. Patient is off BiPAP and not hypoxic. -CT chest negative for PE. -Patient has a stable right middle lobe lung nodule. Recommended outpatient follow-up. -pro-calcitonin  not elevated. -Continue Pepcid.  #Diabetes: Continue sliding scale. Monitor blood sugar level especially on steroid. Check a1c. Add novolog 3 units with meal.  #Hyperlipidemia: Continue statin.  # Leukocytosis due to steroid. Patient is afebrile.  #Diarrhea: C. difficile negative. Abdomen exam benign. Ordered Imodium as needed. Continue to monitor. Patient with history of hemorrhoids. Ordered Anusol and lidocaine topical.  Continue current medication. PT/OT eval  DVT prophylaxis: Lovenox subcutaneous Code Status: Full code Family Communication: No family at bedside Disposition Plan: Currently admitted    Consultants:   Admitted by pulmonary critical care and transfer to [facility]  Procedures: None Antimicrobials: None  Subjective: Seen and examined at bedside. Still with cough and dyspnea on exertion. Diffuse wheeze. Denied chest pain, nausea or vomiting. Objective: Vitals:   09/12/17 2001 09/12/17 2051 09/13/17 0408 09/13/17 0836  BP: (!) 145/78  (!) 150/74   Pulse: 73  60   Resp: 18  16   Temp: 97.9 F (36.6 C)  98 F (36.7 C)   TempSrc: Oral  Oral   SpO2: 96% 96% 95% 100%  Weight:       Height:        Intake/Output Summary (Last 24 hours) at 09/13/17 1131 Last data filed at 09/13/17 0900  Gross per 24 hour  Intake             1740 ml  Output                0 ml  Net             1740 ml   Filed Weights   09/10/17 0625 09/11/17 0500  Weight: 83.5 kg (184 lb) 83.4 kg (183 lb 13.8 oz)    Examination:  General exam: Not in distress  Respiratory system: Bilateral diffuse wheeze with mildly increased work of breathing. No crackle. Cardiovascular system: Regular rate rhythm S1-S2 normal. No pedal edema. Gastrointestinal system: Abdomen soft, nontender, nondistended. Bowel sound positive. Central nervous system: Alert and oriented. No focal neurological deficits. Extremities: Symmetric 5 x 5 power. Skin: No rashes, lesions or ulcers Psychiatry: Judgement and insight appear normal. Mood & affect appropriate.     Data Reviewed: I have personally reviewed following labs and imaging studies  CBC:  Recent Labs Lab 09/10/17 0636 09/10/17 1333 09/11/17 0136 09/12/17 0416 09/13/17 0220  WBC 6.8 4.7 6.4 18.3* 15.1*  NEUTROABS 3.7  --   --   --   --   HGB 14.5 12.9 12.1 12.6 12.2  HCT 42.5 38.7 35.2* 37.0 36.8  MCV 88.0 89.0 87.6 87.9 87.6  PLT 303 309 307 339 841   Basic Metabolic Panel:  Recent Labs Lab 09/10/17 0636 09/10/17 1333 09/11/17 0136 09/12/17 0416  NA 136  --  139 140  K 3.9  --  3.5 4.1  CL 103  --  111 109  CO2 23  --  21* 27  GLUCOSE 136*  --  129* 135*  BUN 5*  --  <5* 11  CREATININE 0.85 0.93 0.60 0.76  CALCIUM 9.2  --  8.6* 9.0  MG  --   --  2.0 2.3  PHOS  --   --  2.1* 2.3*   GFR: Estimated Creatinine Clearance: 70.8 mL/min (by C-G formula based on SCr of 0.76 mg/dL). Liver Function Tests:  Recent Labs Lab 09/10/17 0636  AST 32  ALT 15  ALKPHOS 55  BILITOT 1.0  PROT 7.9  ALBUMIN 4.1   No results for input(s): LIPASE, AMYLASE in the last 168 hours. No results for input(s): AMMONIA in the last 168  hours. Coagulation Profile:  Recent Labs Lab 09/10/17 1333  INR 1.09   Cardiac Enzymes: No results for input(s): CKTOTAL, CKMB, CKMBINDEX, TROPONINI in the last 168 hours. BNP (last 3 results) No results for input(s): PROBNP in the last 8760 hours. HbA1C: No results for input(s): HGBA1C in the last 72 hours. CBG:  Recent Labs Lab 09/12/17 1616 09/12/17 2005 09/12/17 2354 09/13/17 0410 09/13/17 0827  GLUCAP 169* 168* 169* 147* 257*   Lipid Profile: No results for input(s): CHOL, HDL, LDLCALC, TRIG, CHOLHDL, LDLDIRECT in the last 72 hours. Thyroid Function Tests: No results for input(s): TSH, T4TOTAL, FREET4, T3FREE, THYROIDAB in the last 72 hours. Anemia Panel: No results for input(s): VITAMINB12, FOLATE, FERRITIN, TIBC, IRON, RETICCTPCT in the last 72 hours. Sepsis Labs:  Recent Labs Lab 09/10/17 0656 09/10/17 0932 09/10/17 1333 09/10/17 1830 09/11/17 0136  PROCALCITON  --   --  0.11  --  0.14  LATICACIDVEN 2.63* 5.50* 6.9* 3.9*  --     Recent Results (from the past 240 hour(s))  Blood Culture (routine x 2)     Status: None (Preliminary result)   Collection Time: 09/10/17  6:41 AM  Result Value Ref Range Status   Specimen Description BLOOD RIGHT FOREARM  Final   Special Requests IN PEDIATRIC BOTTLE Blood Culture adequate volume  Final   Culture NO GROWTH 3 DAYS  Final   Report Status PENDING  Incomplete  Blood Culture (routine x 2)     Status: None (Preliminary result)   Collection Time: 09/10/17  6:48 AM  Result Value Ref Range Status   Specimen Description BLOOD LEFT ANTECUBITAL  Final   Special Requests   Final    BOTTLES DRAWN AEROBIC AND ANAEROBIC Blood Culture adequate volume   Culture NO GROWTH 3 DAYS  Final   Report Status PENDING  Incomplete  MRSA PCR Screening     Status: None   Collection Time: 09/10/17  1:42 PM  Result Value Ref Range Status   MRSA by PCR NEGATIVE NEGATIVE Final    Comment:        The GeneXpert MRSA Assay (FDA approved for  NASAL specimens only), is one component of a comprehensive MRSA colonization surveillance program. It is not intended to diagnose MRSA infection nor to guide or monitor treatment for MRSA infections.   C difficile quick scan w PCR reflex     Status: None   Collection Time: 09/12/17  4:46 PM  Result Value Ref Range Status   C Diff antigen NEGATIVE NEGATIVE Final   C Diff toxin NEGATIVE NEGATIVE Final   C Diff interpretation No C. difficile detected.  Final  Radiology Studies: Dg Chest Port 1 View  Result Date: 09/12/2017 CLINICAL DATA:  Acute respiratory failure EXAM: PORTABLE CHEST 1 VIEW COMPARISON:  Chest radiograph from one day prior. FINDINGS: Stable cardiomediastinal silhouette with normal heart size. No pneumothorax. No pleural effusion. No pulmonary edema. Stable blunting of the left costophrenic angle with associated mild hazy opacity compatible with scarring as seen on the recent chest CT. No acute consolidative airspace disease. IMPRESSION: Stable pleural-parenchymal scarring at the left lung base. No acute cardiopulmonary disease. Electronically Signed   By: Ilona Sorrel M.D.   On: 09/12/2017 07:06        Scheduled Meds: . arformoterol  15 mcg Nebulization BID  . aspirin EC  81 mg Oral Daily  . bisoprolol  2.5 mg Oral Daily  . budesonide (PULMICORT) nebulizer solution  0.5 mg Nebulization BID  . dextromethorphan-guaiFENesin  1 tablet Oral BID  . famotidine  20 mg Oral QHS  . heparin  5,000 Units Subcutaneous Q8H  . hydrocortisone   Rectal BID  . insulin aspart  0-9 Units Subcutaneous Q4H  . ipratropium-albuterol  3 mL Nebulization Q6H  . methylPREDNISolone (SOLU-MEDROL) injection  80 mg Intravenous Q8H  . montelukast  10 mg Oral QHS  . pantoprazole  40 mg Oral Daily  . rosuvastatin  20 mg Oral Daily   Continuous Infusions: . sodium chloride       LOS: 3 days    Konya Fauble Tanna Furry, MD [facility] Pager [phone]  If 7PM-7AM,  please contact night-coverage [email or web address] Password [facility] 09/13/2017, 11:31 AM

## 2017-09-14 LAB — GLUCOSE, CAPILLARY
Glucose-Capillary: 171 mg/dL — ABNORMAL HIGH (ref 65–99)
Glucose-Capillary: 216 mg/dL — ABNORMAL HIGH (ref 65–99)
Glucose-Capillary: 331 mg/dL — ABNORMAL HIGH (ref 65–99)

## 2017-09-14 MED ORDER — IPRATROPIUM-ALBUTEROL 0.5-2.5 (3) MG/3ML IN SOLN
3.0000 mL | Freq: Three times a day (TID) | RESPIRATORY_TRACT | Status: DC
  Administered 2017-09-14: 3 mL via RESPIRATORY_TRACT
  Filled 2017-09-14: qty 3

## 2017-09-14 MED ORDER — LIRAGLUTIDE 18 MG/3ML ~~LOC~~ SOPN: 1.8000 mg | PEN_INJECTOR | Freq: Every day | SUBCUTANEOUS | Status: DC

## 2017-09-14 MED ORDER — ZOLPIDEM TARTRATE 5 MG PO TABS
5.0000 mg | ORAL_TABLET | Freq: Every evening | ORAL | Status: DC | PRN
  Administered 2017-09-14: 5 mg via ORAL
  Filled 2017-09-14: qty 1

## 2017-09-14 MED ORDER — PREDNISONE 10 MG PO TABS: ORAL_TABLET | ORAL | 0 refills | Status: DC

## 2017-09-14 MED ORDER — ALBUTEROL SULFATE HFA 108 (90 BASE) MCG/ACT IN AERS: 1.0000 | INHALATION_SPRAY | Freq: Four times a day (QID) | RESPIRATORY_TRACT | 0 refills | Status: DC | PRN

## 2017-09-14 NOTE — Progress Notes (Signed)
OT Cancellation Note  Patient Details Name: [name] MRN: [id number] DOB: 11/22/1946   Cancelled Treatment:    Reason Eval/Treat Not Completed: OT screened, no needs identified, will sign off. Discussed with PT and RN. Pt completing ADL independently in hospital setting at this time and is at baseline. No acute OT needs identified. OT will sign off.   Norman Herrlich, MS OTR/L  Pager: [name] 09/14/2017, 10:24 AM

## 2017-09-14 NOTE — Progress Notes (Signed)
PT Cancellation Note  Patient Details Name: [name] MRN: [id number] DOB: [birth year]   Cancelled Treatment:    Reason Eval/Treat Not Completed: PT screened, no needs identified, will sign off. Discussed with nurse who reported that patient was independent in room. Patient reports that she is at baseline and does not see need for further evaluation by PT. If needs change please reconsult.  Jamella Grayer SPT Garyn Waguespack 09/14/2017, 8:26 AM

## 2017-09-14 NOTE — Discharge Summary (Signed)
Physician Discharge Summary  [name]:[id number] DOB: 1946/09/19 DOA: 09/10/2017  PCP: [name], MD  Admit date: 09/10/2017 Discharge date: 09/14/2017  Admitted From:home Disposition: home   Recommendations for Outpatient Follow-up:  1. Follow up with PCP in 1-2 weeks 2. Please obtain BMP/CBC in one week   Home Health:no Equipment/Devices:none Discharge Condition:stable CODE STATUS:full code Diet recommendation:heart healthy  Brief/Interim Summary: 71 year old female with history of hypertension, diabetes, COPD, chronic cough follows up with Dr. [name] presented 9/12 with c/o SOB, cough, back pain.  patient was admitted by pulmonary critical care for COPD exacerbation.   #Acute respiratory failure with hypoxia due to acute exacerbation of COPD: -Treated with bronchodilator, Solu-Medrol with clinical improvement. Patient is off BiPAP and she is not hypoxic. CT scan of chest negative for PE. Recommended to continue inhalers and follow up with her pulmonologist. Prescription for albuterol provided. Patient will be discharged with tapering dose of prednisone.  -Patient has a stable right middle lobe lung nodule. Recommended outpatient follow-up. -pro-calcitonin  not elevated.  #Diabetes: A1c 6.1. Continue Victoza. Does verified with the patient. Recommended to follow-up with PCP.  #Hyperlipidemia: Continue statin.  # Leukocytosis due to steroid. Patient is afebrile. Cultures negative.  #Diarrhea: C. difficile negative. Abdomen exam benign. Clinically improved.  Patient was significant clinical improvement. Able to ambulate without difficulties. No shortness of breath. Has minimal wheezing on physical exam. Patient will be discharged with oral prednisone and inhalers. She has a good follow-up with her pulmonologist.  Discharge Diagnoses:  Active Problems:   Acute respiratory failure (HCC)   Community acquired pneumonia of left lower lobe of lung ([place])    Diarrhea    Discharge Instructions  Discharge Instructions    Call MD for:  difficulty breathing, headache or visual disturbances    Complete by:  As directed    Call MD for:  extreme fatigue    Complete by:  As directed    Call MD for:  hives    Complete by:  As directed    Call MD for:  persistant dizziness or light-headedness    Complete by:  As directed    Call MD for:  persistant nausea and vomiting    Complete by:  As directed    Call MD for:  severe uncontrolled pain    Complete by:  As directed    Call MD for:  temperature >100.4    Complete by:  As directed    Diet - low sodium heart healthy    Complete by:  As directed    Increase activity slowly    Complete by:  As directed      Allergies as of 09/14/2017      Reactions   Other Itching   HAIR DYE      Medication List    TAKE these medications   albuterol 108 (90 Base) MCG/ACT inhaler Commonly known as:  PROVENTIL HFA;VENTOLIN HFA Inhale 1-2 puffs into the lungs every 6 (six) hours as needed for wheezing or shortness of breath.   aspirin 81 MG tablet Take 81 mg by mouth daily.   BD PEN NEEDLE NANO U/F 32G X 4 MM Misc Generic drug:  Insulin Pen Needle 1 each 3 (three) times daily.   benzonatate 200 MG capsule Commonly known as:  TESSALON TAKE 1 CAPSULE (200 MG TOTAL) BY MOUTH 3 (THREE) TIMES DAILY AS NEEDED FOR COUGH.   Biotin 2500 MCG Caps Take 1 capsule by mouth daily.   bisoprolol 5 MG tablet  Commonly known as:  ZEBETA TAKE 1/2 TAB BY MOUTH DAILY What changed:  See the new instructions.   budesonide-formoterol 80-4.5 MCG/ACT inhaler Commonly known as:  SYMBICORT Take 2 puffs first thing in am and then another 2 puffs about 12 hours later.   CALCIUM PO Take 1 tablet by mouth daily.   chlorpheniramine 4 MG tablet Commonly known as:  CHLOR-TRIMETON Take 4 mg by mouth 2 (two) times daily. Add 1 every 4 hours as needed for drainage, throat clearing   famotidine 20 MG tablet Commonly known  as:  PEPCID TAKE 1 TABLET (20 MG TOTAL) BY MOUTH AT BEDTIME.   liraglutide 18 MG/3ML Sopn Commonly known as:  VICTOZA Inject 0.3 mLs (1.8 mg total) into the skin at bedtime. What changed:  how much to take   LYRICA 75 MG capsule Generic drug:  pregabalin Take 75 mg by mouth daily as needed (for pain).   montelukast 10 MG tablet Commonly known as:  SINGULAIR TAKE 1 TABLET (10 MG TOTAL) BY MOUTH AT BEDTIME. What changed:  Another medication with the same name was removed. Continue taking this medication, and follow the directions you see here.   oxyCODONE 5 MG immediate release tablet Commonly known as:  ROXICODONE Take 1 tablet (5 mg total) by mouth every 4 (four) hours as needed for severe pain.   pantoprazole 40 MG tablet Commonly known as:  PROTONIX Take 1 tablet (40 mg total) by mouth daily. Take 30-60 min before first meal of the day   predniSONE 10 MG tablet Commonly known as:  DELTASONE Take  4 each am x 2 days,   2 each am x 2 days,  1 each am x 2 days and stop   rosuvastatin 20 MG tablet Commonly known as:  CRESTOR Take 20 mg by mouth every evening.   vitamin C 500 MG tablet Commonly known as:  ASCORBIC ACID Take 500 mg by mouth daily.   Vitamin D3 2000 units Tabs Take 2,000 Units by mouth daily.            Discharge Care Instructions        Start     Ordered   09/14/17 0000  albuterol (PROVENTIL HFA;VENTOLIN HFA) 108 (90 Base) MCG/ACT inhaler  Every 6 hours PRN     09/14/17 0959   09/14/17 0000  predniSONE (DELTASONE) 10 MG tablet     09/14/17 0959   09/14/17 0000  Increase activity slowly     09/14/17 0959   09/14/17 0000  Diet - low sodium heart healthy     09/14/17 0959   09/14/17 0000  Call MD for:  temperature >100.4     09/14/17 0959   09/14/17 0000  Call MD for:  persistant nausea and vomiting     09/14/17 0959   09/14/17 0000  Call MD for:  severe uncontrolled pain     09/14/17 0959   09/14/17 0000  Call MD for:  difficulty breathing,  headache or visual disturbances     09/14/17 0959   09/14/17 0000  Call MD for:  hives     09/14/17 0959   09/14/17 0000  Call MD for:  persistant dizziness or light-headedness     09/14/17 0959   09/14/17 0000  Call MD for:  extreme fatigue     09/14/17 0959   09/14/17 0000  liraglutide (VICTOZA) 18 MG/3ML SOPN  Daily at bedtime     09/14/17 0959     Follow-up Information  [name], MD. Schedule an appointment as soon as possible for a visit in 1 week(s).   Specialty:  Internal Medicine Contact information: [address] [place] [address] [phone]        Tanda Rockers, MD. Schedule an appointment as soon as possible for a visit in 1 week(s).   Specialty:  Pulmonary Disease Contact information: [address] [place] [address] [phone]          Allergies  Allergen Reactions  . Other Itching    HAIR DYE    Consultations: Patient was initially admitted by pulmonary critical care service  Procedures/Studies: None  Subjective: Seen and examined at bedside. Denied headache, digits, nausea, vomiting, chest, shortness of breath. No cough. Eager to go home. Verbalized understanding of follow-up instructions.  Discharge Exam: Vitals:   09/14/17 0822 09/14/17 0824  BP:    Pulse:    Resp:    Temp:    SpO2: 93% 100%   Vitals:   09/14/17 0432 09/14/17 0819 09/14/17 0822 09/14/17 0824  BP: (!) 161/64     Pulse: 67     Resp: 16     Temp: 98 F (36.7 C)     TempSrc: Oral     SpO2: 96% 93% 93% 100%  Weight: 82.4 kg (181 lb 11.2 oz)     Height:        General: Pt is alert, awake, not in acute distress Cardiovascular: RRR, S1/S2 +, no rubs, no gallops Respiratory: Mild diffuse wheezing but much better than before. No crackles. Abdominal: Soft, NT, ND, bowel sounds + Extremities: no edema, no cyanosis    The results of significant diagnostics from this hospitalization (including imaging, microbiology, ancillary and  laboratory) are listed below for reference.     Microbiology: Recent Results (from the past 240 hour(s))  Blood Culture (routine x 2)     Status: None (Preliminary result)   Collection Time: 09/10/17  6:41 AM  Result Value Ref Range Status   Specimen Description BLOOD RIGHT FOREARM  Final   Special Requests IN PEDIATRIC BOTTLE Blood Culture adequate volume  Final   Culture NO GROWTH 4 DAYS  Final   Report Status PENDING  Incomplete  Blood Culture (routine x 2)     Status: None (Preliminary result)   Collection Time: 09/10/17  6:48 AM  Result Value Ref Range Status   Specimen Description BLOOD LEFT ANTECUBITAL  Final   Special Requests   Final    BOTTLES DRAWN AEROBIC AND ANAEROBIC Blood Culture adequate volume   Culture NO GROWTH 4 DAYS  Final   Report Status PENDING  Incomplete  MRSA PCR Screening     Status: None   Collection Time: 09/10/17  1:42 PM  Result Value Ref Range Status   MRSA by PCR NEGATIVE NEGATIVE Final    Comment:        The GeneXpert MRSA Assay (FDA approved for NASAL specimens only), is one component of a comprehensive MRSA colonization surveillance program. It is not intended to diagnose MRSA infection nor to guide or monitor treatment for MRSA infections.   C difficile quick scan w PCR reflex     Status: None   Collection Time: 09/12/17  4:46 PM  Result Value Ref Range Status   C Diff antigen NEGATIVE NEGATIVE Final   C Diff toxin NEGATIVE NEGATIVE Final   C Diff interpretation No C. difficile detected.  Final     Labs: BNP (last 3 results) No  results for input(s): BNP in the last 8760 hours. Basic Metabolic Panel:  Recent Labs Lab 09/10/17 0636 09/10/17 1333 09/11/17 0136 09/12/17 0416  NA 136  --  139 140  K 3.9  --  3.5 4.1  CL 103  --  111 109  CO2 23  --  21* 27  GLUCOSE 136*  --  129* 135*  BUN 5*  --  <5* 11  CREATININE 0.85 0.93 0.60 0.76  CALCIUM 9.2  --  8.6* 9.0  MG  --   --  2.0 2.3  PHOS  --   --  2.1* 2.3*   Liver  Function Tests:  Recent Labs Lab 09/10/17 0636  AST 32  ALT 15  ALKPHOS 55  BILITOT 1.0  PROT 7.9  ALBUMIN 4.1   No results for input(s): LIPASE, AMYLASE in the last 168 hours. No results for input(s): AMMONIA in the last 168 hours. CBC:  Recent Labs Lab 09/10/17 0636 09/10/17 1333 09/11/17 0136 09/12/17 0416 09/13/17 0220  WBC 6.8 4.7 6.4 18.3* 15.1*  NEUTROABS 3.7  --   --   --   --   HGB 14.5 12.9 12.1 12.6 12.2  HCT 42.5 38.7 35.2* 37.0 36.8  MCV 88.0 89.0 87.6 87.9 87.6  PLT 303 309 307 339 362   Cardiac Enzymes: No results for input(s): CKTOTAL, CKMB, CKMBINDEX, TROPONINI in the last 168 hours. BNP: Invalid input(s): POCBNP CBG:  Recent Labs Lab 09/13/17 1618 09/13/17 2045 09/14/17 0022 09/14/17 0426 09/14/17 0826  GLUCAP 238* 112* 331* 171* 216*   D-Dimer No results for input(s): DDIMER in the last 72 hours. Hgb A1c  Recent Labs  09/13/17 0220  HGBA1C 6.1*   Lipid Profile No results for input(s): CHOL, HDL, LDLCALC, TRIG, CHOLHDL, LDLDIRECT in the last 72 hours. Thyroid function studies No results for input(s): TSH, T4TOTAL, T3FREE, THYROIDAB in the last 72 hours.  Invalid input(s): FREET3 Anemia work up No results for input(s): VITAMINB12, FOLATE, FERRITIN, TIBC, IRON, RETICCTPCT in the last 72 hours. Urinalysis    Component Value Date/Time   COLORURINE STRAW (A) 09/10/2017 0844   APPEARANCEUR CLEAR 09/10/2017 0844   LABSPEC 1.006 09/10/2017 0844   PHURINE 5.0 09/10/2017 0844   GLUCOSEU 50 (A) 09/10/2017 0844   HGBUR NEGATIVE 09/10/2017 0844   BILIRUBINUR NEGATIVE 09/10/2017 0844   KETONESUR 5 (A) 09/10/2017 0844   PROTEINUR NEGATIVE 09/10/2017 0844   NITRITE NEGATIVE 09/10/2017 0844   LEUKOCYTESUR TRACE (A) 09/10/2017 0844   Sepsis Labs Invalid input(s): PROCALCITONIN,  WBC,  LACTICIDVEN Microbiology Recent Results (from the past 240 hour(s))  Blood Culture (routine x 2)     Status: None (Preliminary result)   Collection Time:  09/10/17  6:41 AM  Result Value Ref Range Status   Specimen Description BLOOD RIGHT FOREARM  Final   Special Requests IN PEDIATRIC BOTTLE Blood Culture adequate volume  Final   Culture NO GROWTH 4 DAYS  Final   Report Status PENDING  Incomplete  Blood Culture (routine x 2)     Status: None (Preliminary result)   Collection Time: 09/10/17  6:48 AM  Result Value Ref Range Status   Specimen Description BLOOD LEFT ANTECUBITAL  Final   Special Requests   Final    BOTTLES DRAWN AEROBIC AND ANAEROBIC Blood Culture adequate volume   Culture NO GROWTH 4 DAYS  Final   Report Status PENDING  Incomplete  MRSA PCR Screening     Status: None   Collection Time: 09/10/17  1:42  PM  Result Value Ref Range Status   MRSA by PCR NEGATIVE NEGATIVE Final    Comment:        The GeneXpert MRSA Assay (FDA approved for NASAL specimens only), is one component of a comprehensive MRSA colonization surveillance program. It is not intended to diagnose MRSA infection nor to guide or monitor treatment for MRSA infections.   C difficile quick scan w PCR reflex     Status: None   Collection Time: 09/12/17  4:46 PM  Result Value Ref Range Status   C Diff antigen NEGATIVE NEGATIVE Final   C Diff toxin NEGATIVE NEGATIVE Final   C Diff interpretation No C. difficile detected.  Final     Time coordinating discharge: 31 minutes  SIGNED:   Rosita Fire, MD  [facility] 09/14/2017, 9:59 AM  If 7PM-7AM, please contact night-coverage [email or web address] Password [email or web address]

## 2017-09-15 ENCOUNTER — Ambulatory Visit (INDEPENDENT_AMBULATORY_CARE_PROVIDER_SITE_OTHER): Payer: Medicare Other | Admitting: Internal Medicine

## 2017-09-15 ENCOUNTER — Encounter: Payer: Self-pay | Admitting: Internal Medicine

## 2017-09-15 VITALS — BP 160/90 | HR 82 | Ht 65.0 in | Wt 192.8 lb

## 2017-09-15 DIAGNOSIS — R05 Cough: Secondary | ICD-10-CM | POA: Diagnosis not present

## 2017-09-15 DIAGNOSIS — J449 Chronic obstructive pulmonary disease, unspecified: Secondary | ICD-10-CM | POA: Diagnosis not present

## 2017-09-15 DIAGNOSIS — R059 Cough, unspecified: Secondary | ICD-10-CM

## 2017-09-15 LAB — CULTURE, BLOOD (ROUTINE X 2)
Culture: NO GROWTH
Culture: NO GROWTH
Special Requests: ADEQUATE
Special Requests: ADEQUATE

## 2017-09-15 MED ORDER — BUDESONIDE-FORMOTEROL FUMARATE 80-4.5 MCG/ACT IN AERO: INHALATION_SPRAY | RESPIRATORY_TRACT | 0 refills | Status: DC

## 2017-09-15 NOTE — Patient Instructions (Addendum)
Plan A = Automatic = symbicort 80 Take 2 puffs first thing in am and then another 2 puffs about 12 hours later.   Work on Engineer, technical sales technique:  relax and gently blow all the way out then take a nice smooth deep breath back in, triggering the inhaler at same time you start breathing in.  Hold for up to 5 seconds if you can. Blow out thru nose. Rinse and gargle with water when done   Plan B = Backup Only use your albuterol as a rescue medication to be used if you can't catch your breath by resting or doing a relaxed purse lip breathing pattern.  - The less you use it, the better it will work when you need it. - Ok to use the inhaler up to 2 puffs  every 4 hours if you must but call for appointment if use goes up over your usual need - Don't leave home without it !!  (think of it like the spare tire for your car)   Finish your prednisone and follow all other instructions on med calendar as listed  Keep appt to see Tammy Np with all meds in hand for a new med calendar

## 2017-09-15 NOTE — Progress Notes (Signed)
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 Pulmonary Consult    Former pt of Dr. Annamaria Boots. Pt c/o increased cough x 3 wks. Cough is prod with moderate, thick, light yellow sputum.  She coughs until loses urinary continence.  Not sleeping well due to cough.   remembers having tendency to throat tickle in high school  but completely resolved by graduation  then recurred in her 38s and resolved with codeine then recurred in 2011  but bad to worse x 3 weeks assoc with sensation of choking, mucus is thick light yellow.  acei stopped about a month prior to OV  Replaced by losartan  Inhalers make it worse, esp dpi  Kouffman Reflux v Neurogenic Cough Differentiator Reflux Comments  Do you awaken from a sound sleep coughing violently?                            With trouble breathing? Yes   Do you have choking episodes when you cannot  Get enough air, gasping for air ?              Yes   Do you usually cough when you lie down into  The bed, or when you just lie down to rest ?                          Yes   Do you usually cough after meals or eating?         maybe   Do you cough when (or after) you bend over?    No    GERD SCORE     Kouffman Reflux v Neurogenic Cough Differentiator Neurogenic   Do you more-or-less cough all day long? yes   Does change of temperature make you cough? no   Does laughing or chuckling cause you to cough? no   Do fumes (perfume, automobile fumes, burned   Toast, etc.,) cause you to cough ?      Not aware   Does speaking, singing, or talking on the phone cause you to cough   ?               sometimes   Neurogenic/Airway score    rec  Max rx gerd/prn tramadol     11/30/2014 f/u ov/[name] re: chronic cough  Chief Complaint  Patient presents with  . Acute Visit    Pt states that her cough is no better since last visit here 11/18/14. She also c/o frequent throat clearing and "rattling in throat". She states that there have been 3 occasions since last visit where she woke up from sleeping with cough and "felt like choking".   Overall pattern has not improved on rx unless includes tramadol  though not clear that she's been able to take the complex regimen  We've recommended and has not  yet started the prednisone that was recommended over the phone  rec Start dulera 100 Take 2 puffs first thing in am and then another 2 puffs about 12 hours later.  Work on inhaler technique:  Get the prednisone right away For cough > mucinex dm up to 1200 mg twice daily and supplement with tramadol 50 mg up to 2 every 4 hours if needed  Change neurontin to 100 three times daily     01/03/2015 f/u ov/[name] re: cough since 2011/ GOLD II COPD / tickle since childhood  Chief Complaint  Patient presents with  . Follow-up    cough has resolved; no concerns today   prednisone may have helped  This time / used it with tramadol but no more tramadol x weeks and no flare  May have caught cold x  sev days but so far just controlling cough with candy/ ? Better on dulera > note sure, may have caused choking  Stopped acid suppression/  dulera but still on neurontin 100 tid rec Stay on neurontin (gabapentin) three times a day indefinitely  At first sign of flare > Pantoprazole (protonix) 40 mg   Take 30-60 min before first meal of the day and Pepcid 20 mg one bedtime until gone and return if needed If all else fails and you are having trouble breathing try dulera 100 up to 2 every  12 hours  F/u is prn     04/14/2017 acute extended ov/[name] re:  Copd II  On proair bid as only "maint"  Chief Complaint  Patient presents with  . Follow-up    Pt states her breathing is ok, pt is still coughing, the cough is productive with thick yellow mucus,pt is wheezing at times Denies chest tightness,fever   wheeze better p uses proair  Totally off the regimen that previously improved the cough and focused on her cough medication refills with narcotic containing meds  Had med calendar but not updating/ using Doe = MMRC3 = can't walk 100 yards even at a slow pace at a flat grade s stopping due to sob   rec  Pantoprazole (protonix) 40 mg   Take  30-60 min before first meal of the day and continue  Pepcid (famotidine)  20 mg one @  bedtime until return to office   Take delsym two tsp every 12 hours and supplement if needed with  tramadol 50 mg up to 2 every 4 hours to suppress the urge to cough. Swallowing water or using ice chips/non mint and menthol containing candies (such as lifesavers or sugarless jolly ranchers) are also effective.  You should rest your voice and avoid activities that you know make you cough. Once you have eliminated the cough for 3 straight days try reducing the tramadol first,  then the delsym as tolerated.   Prednisone 10 mg take  4 each am x 2 days,   2 each am x 2 days,  1 each am x 2 days and stop  Plan A = Automatic = symbicort 80 Take 2 puffs first thing in am and then another 2 puffs about 12 hours later.  Plan B = Backup Only use your albuterol as a rescue medication See Tammy NP w/in 2 weeks (or next available after that) with all your medications, even over the counter meds, separated in two separate bags, the ones you take no matter what vs the ones you stop once you feel better and take only as needed when you feel you need them.  Tammy  will generate for you a new user friendly medication calendar that will put [place] all on the same page re: your medication  use.   Please remember to go to the  x-ray department downstairs in the basement  for your tests - we will call you with the results when they are available. Add: Augmentin 875 mg take one pill twice daily  X 10 days - take at breakfast and supper with large glass of water.  It would help reduce the usual side effects (diarrhea and yeast infections) if you ate cultured yogurt at lunch.     04/28/17 NP f/u ov much better rec  Med calendar/ no change     08/19/2017  Acute extended  ov/Clea Dubach re: GOLD II copd/ chronic cough no longer on symbicort Chief Complaint  Patient presents with  . Acute Visit    Pt states that she started coughing 08/04/17 and it has gradually become worse. Pt also c/o SOB and feels like someone is sitting on her stomach.  Sleeping had been doing fine p h1 hs but still some daytime throat clearing then much worse since aug 6 not using  action plan on med calendar/ did not bring/ not on symbicort, says pred is the only thing that helps this type of cough which is dry unless coughs so hard vomits / gags and loses her breath and speech rec Prednisone 10 mg take  4 each am x 2 days,   2 each am x 2 days,  1 each am x 2 days and stop  Restart symbicort 80 Take 2 puffs first thing in am and then another 2 puffs about 12 hours later.  For cough >  Benzoate first then oxycodone supplement up to every 4 hours as needed      Admit date: 09/10/2017 Discharge date: 09/14/2017     Brief/Interim Summary: 71 year old female with history of hypertension, diabetes, COPD, chronic cough follows up with Dr. [name] presented 9/12 with c/o SOB, cough, back pain. patient was admitted by pulmonary critical care for COPD exacerbation.   #Acute respiratory failure with hypoxia due to acute exacerbation of COPD: -Treated with bronchodilator, Solu-Medrol with clinical improvement. Patient is off BiPAP and she is not hypoxic. CT scan of chest negative for PE. Recommended to continue inhalers and  follow up with her pulmonologist. Prescription for albuterol provided. Patient will be discharged with tapering dose of prednisone.  -Patient has a stable right middle lobe lung nodule. Recommended outpatient follow-up. -pro-calcitonin not elevated.  #Diabetes: A1c 6.1. Continue Victoza. Does verified with the patient. Recommended to follow-up with PCP.  #Hyperlipidemia: Continue statin.  # Leukocytosis due to steroid. Patient is afebrile. Cultures negative.  #Diarrhea: C. difficile negative. Abdomen exam benign. Clinically improved.  Patient was significant clinical improvement. Able to ambulate without difficulties. No shortness of breath. Has minimal wheezing on physical exam. Patient will be discharged with oral prednisone and inhalers. She has a good follow-up with her pulmonologist.  Discharge Diagnoses:  Active Problems:   Acute respiratory failure ([place])   Community acquired pneumonia of left lower lobe of lung (HCC)> ruled out by CTa   Diarrhea     09/15/2017  Transmission  f/u ov/Rollan Roger re:  Chief Complaint  Patient presents with  . Hospitalization Follow-up    Breathing has improved some, but not back at her normal baseline. She states her cough woke her up this am and had some bloody nasal d/c.  She has been coughing up some thick,  yellow sputum.  She is using   walking all over [place] fine,  Flew back to [place]  on a commercial jet and short of breath  walking from gate [place] then by 2 days later in ER due to severe sob / tightness across upper back >> albuterol relived the discomfort w/in 5 min but did not have her rescue so had to call 911 (proair listed on med calendar she just got from me 08/19/17 under written action plan) - says was so short of breath at that point could not have used saba anyway.  Not following action plan either re controlling cough which is also worse/ has not started meds from discharge    No obvious day to day or daytime  variability or assoc excess/ purulent sputum or mucus plugs or hemoptysis or cp or chest tightness, subjective wheeze or overt sinus or hb symptoms. No unusual exp hx or h/o childhood pna/ asthma or knowledge of premature birth.  Sleeping ok flat without nocturnal  or early am exacerbation  of respiratory  c/o's or need for noct saba. Also denies any obvious fluctuation of symptoms with weather or environmental changes or other aggravating or alleviating factors except as outlined above   Current Allergies, Complete Past Medical History, Past Surgical History, Family History, and Social History were reviewed in [employer] record.  ROS  The following are not active complaints unless bolded sore throat, dysphagia, dental problems, itching, sneezing,  nasal congestion or disharge of excess mucus or purulent secretions, ear ache,   fever, chills, sweats, unintended wt loss or wt gain, classically pleuritic or exertional cp,  orthopnea pnd or leg swelling, presyncope, palpitations, abdominal pain, anorexia, nausea, vomiting, diarrhea  or change in bowel habits or bladder habits, change in stools or change in urine, dysuria, hematuria,  rash, arthralgias, visual complaints, headache, numbness, weakness or ataxia or problems with walking or coordination,  change in mood/affect or memory.        Current Meds  Medication Sig  . albuterol (PROVENTIL HFA;VENTOLIN HFA) 108 (90 Base) MCG/ACT inhaler Inhale 1-2 puffs into the lungs every 6 (six) hours as needed for wheezing or shortness of breath.  Marland Kitchen aspirin 81 MG tablet Take 81 mg by mouth daily.   . BD PEN NEEDLE NANO U/F 32G X 4 MM MISC 1 each 3 (three) times daily.  . benzonatate (TESSALON) 200 MG capsule TAKE 1 CAPSULE (200 MG TOTAL) BY MOUTH 3 (THREE) TIMES DAILY AS NEEDED FOR COUGH.  . Biotin 2500 MCG CAPS Take 1 capsule by mouth daily.  . bisoprolol (ZEBETA) 5 MG tablet TAKE 1/2 TAB BY MOUTH DAILY (Patient taking differently: TAKE  2.5 mg BY MOUTH DAILY)  . budesonide-formoterol (SYMBICORT) 80-4.5 MCG/ACT inhaler Take 2 puffs first thing in am and then another 2 puffs about 12 hours later.  Marland Kitchen CALCIUM PO Take 1 tablet by mouth daily.  . chlorpheniramine (CHLOR-TRIMETON) 4 MG tablet Take 4 mg by mouth 2 (two) times daily. Add 1 every 4 hours as needed for drainage, throat clearing  . Cholecalciferol (VITAMIN D3) 2000 UNITS TABS Take 2,000 Units by mouth daily.  . famotidine (PEPCID) 20 MG tablet TAKE 1 TABLET (20 MG TOTAL) BY MOUTH AT BEDTIME.  Marland Kitchen liraglutide (VICTOZA) 18 MG/3ML SOPN Inject 0.3 mLs (1.8 mg total) into the skin at bedtime.  Marland Kitchen LYRICA 75 MG capsule Take 75 mg by mouth daily as needed (for pain).   . montelukast (SINGULAIR) 10 MG tablet TAKE  1 TABLET (10 MG TOTAL) BY MOUTH AT BEDTIME.  Marland Kitchen oxyCODONE (ROXICODONE) 5 MG immediate release tablet Take 1 tablet (5 mg total) by mouth every 4 (four) hours as needed for severe pain.  . pantoprazole (PROTONIX) 40 MG tablet TAKE 1 TABLET EVERY DAY 30-60 MINUTES BEFORE FIRST MEAL OF THE DAY  . rosuvastatin (CRESTOR) 20 MG tablet Take 20 mg by mouth every evening.   . vitamin C (ASCORBIC ACID) 500 MG tablet Take 500 mg by mouth daily.  . [DISCONTINUED] budesonide-formoterol (SYMBICORT) 80-4.5 MCG/ACT inhaler Take 2 puffs first thing in am and then another 2 puffs about 12 hours later.                              Objective:   Physical Exam  amb hoarse bf nad    04/07/2015 207  > 04/21/2015 208 > 06/01/2015  210>211 09/08/2015 > 01/30/2016   184 > 10/23/2016  190 > 04/14/2017   183 > 08/19/2017  185 > 09/15/2017    Vital signs reviewed  -  - Note on arrival 02 sats  94% on RA and bp 160/90     HEENT: nl dentition,  and oropharynx. Nl external ear canals without cough reflex Nonspecific turnbinate edema  Bilaterally   NECK :  without JVD/Nodes/TM/ nl carotid upstrokes bilaterally   LUNGS: no acc muscle use-  insp and exp rhonchi bilaterally with lots of transmitted  upper airway noise   CV:  RRR  no s3 or murmur or increase in P2, no edema   ABD:  soft and nontender with nl excursion in the supine position. No bruits or organomegaly, bowel sounds nl  MS:  warm without deformities, calf tenderness, cyanosis or clubbing  SKIN: warm and dry without lesions    NEURO:  alert, approp, no deficits          I personally reviewed images and agree with radiology impression as follows:   Chest CTa  09/10/17 No significant pulmonary findings. Left basilar scarring changes are stable. Mild eventration of the left hemidiaphragm. No worrisome pulmonary lesions or acute pulmonary findings. Ache ground-glass nodule in the right middle lobe adjacent to the minor fissure, best seen on series 6, image 81 and series 9, image 55. This appears stable when compared to the prior examination of 2015. It measures a maximum of 7 mm and I would recommend a followup noncontrast chest CT scan 1-2 years to document 5 years of stability.

## 2017-09-16 ENCOUNTER — Encounter: Payer: Self-pay | Admitting: Internal Medicine

## 2017-09-16 NOTE — Assessment & Plan Note (Addendum)
Quit smoking 2007 PFT: 03/17/2014-  FEV1  1.28 (58%) ratio 66 and no better p saba, ERV 27 and DLCO 56 corrects to 77 - 11/30/2014   try dulera 100 2bid > 01/03/2015 not clear helping,  use prn > flare since mid 02/2015 cough > sob  - 04/07/2015 p extensive coaching HFA effectiveness =    90% > try dulera 100 2bid > improved 04/21/15   - 06/01/2015   try dulera 200 2bid  > did not maintain  - Spirometry 04/14/2017  FEV1 1.13 (58%)  Ratio 52 p saba w/in 4 h > add symb 80 2bid   - 08/19/2017    resumed sym 80 2bid - 09/15/2017  After extensive coaching HFA effectiveness =    75% > continue symb/ try taper off pred    DDX of  difficult airways management almost all start with A and  include Adherence, Ace Inhibitors, Acid Reflux, Active Sinus Disease, Alpha 1 Antitripsin deficiency, Anxiety masquerading as Airways dz,  ABPA,  Allergy(esp in young), Aspiration (esp in elderly), Adverse effects of meds,  Active smokers, A bunch of PE's (a small clot burden can't cause this syndrome unless there is already severe underlying pulm or vascular dz with poor reserve) plus two Bs  = Bronchiectasis and Beta blocker use..and one C= CHF  Adherence is always the initial "prime suspect" and is a multilayered concern that requires a "trust but verify" approach in every patient - starting with knowing how to use medications, especially inhalers, correctly, keeping up with refills and understanding the fundamental difference between maintenance and prns vs those medications only taken for a very short course and then stopped and not refilled.  - see hfa teaching - return in 2 weeks with all meds in hand using a trust but verify approach to confirm accurate Medication  Reconciliation The principal here is that until we are certain that the  patients are doing what we've asked, it makes no sense to ask them to do more.   ? Acid (or non-acid) GERD > always difficult to exclude as up to 75% of pts in some series report no assoc GI/  Heartburn symptoms> rec continue max (24h)  acid suppression and diet restrictions/ reviewed     ? Anxiety> usually at the bottom of this list of usual suspects but should be much higher on this pt's based on H and P and note all this happened on way home from [facility] with very stressful visit with sick brother and went from no need for saba to "too short of breat to use it "  But turned out didn't try it because didn't have it .   ? Allergy > taper off prednisone, continue singulair   ? A bunch of PE's > neg CTangiogram   ? Bronciectasis > ruled out by Ct  ? BB effect > unlikely on low dose zebeta   ? chf > trop neg but did not check BNP > no evidence of cxr / ct of any edema but next flare rec check bnp   I had an extended discussion with the patient reviewing all relevant studies completed to date including her EPIC ER/ inpt record/ verifying and cross checking her hx for accuracy/ and  lasting 25 minutes of a 40  minute post hosp  visit addressing recurrent  severe non-specific but potentially very serious refractory respiratory symptoms of uncertain and potentially multiple  etiologies.     Each maintenance medication was reviewed in detail including  most importantly the difference between maintenance and as needed and under what circumstances the prns are to be used. This was done in the context of a medication calendar review which provided the patient with a user-friendly unambiguous mechanism for medication administration and reconciliation and provides an action plan for all active problems. It is critical that this be shown to every doctor  for modification during the office visit if necessary so the patient can use it as a working document.

## 2017-09-16 NOTE — Assessment & Plan Note (Signed)
-   allergy profile 03/10/14  >  IgE 18 with neg RAST - sinus ct 10/25/2014 > Clear sinuses. - try off cozar 11/03/2014 >>> - neurontin 100 tid rx 11/30/14 > improved 01/03/2015 > changed to lyrica 04/2015 but not taking consistently as of 01/30/2016  - sinus CT 02/17/15 > neg   Encouraged to follow action plan as listed

## 2017-09-18 ENCOUNTER — Encounter: Payer: Self-pay | Admitting: Internal Medicine

## 2017-09-19 NOTE — Telephone Encounter (Signed)
Add her on this afternoon and but she absolutely must bring all active meds with her to sort out why she's doing so poorly >  We can give her a neb here and set up up to use one at home   Only other option is to go back to er and start over

## 2017-09-19 NOTE — Telephone Encounter (Signed)
MW please advise. Pt recently saw you on 09/15/17. Thanks.

## 2017-09-19 NOTE — Telephone Encounter (Signed)
Left a detailed msg with recs per MW and will also email her the same

## 2017-09-21 ENCOUNTER — Encounter: Payer: Self-pay | Admitting: Internal Medicine

## 2017-09-22 ENCOUNTER — Telehealth: Payer: Self-pay | Admitting: Internal Medicine

## 2017-09-22 ENCOUNTER — Encounter: Payer: Self-pay | Admitting: Internal Medicine

## 2017-09-22 DIAGNOSIS — J449 Chronic obstructive pulmonary disease, unspecified: Secondary | ICD-10-CM

## 2017-09-22 MED ORDER — IPRATROPIUM-ALBUTEROL 0.5-2.5 (3) MG/3ML IN SOLN: 3.0000 mL | Freq: Four times a day (QID) | RESPIRATORY_TRACT | 2 refills | Status: DC | PRN

## 2017-09-22 NOTE — Telephone Encounter (Signed)
MW please advise what nebulizer medication that you would like to order for the pt.  She stated that was the entire reason for this call.  Please advise. thanks

## 2017-09-22 NOTE — Telephone Encounter (Signed)
Spoke with pt, aware of results/recs.  rx sent to pharmacy, order for nebulizer sent to DME as pt does not have a nebulizer machine at home.  Pt will keep appt with TP on Monday.  Nothing further needed.

## 2017-09-22 NOTE — Telephone Encounter (Signed)
Spoke with pt, she has an e-mail about this. She would like to know why she has to bring her medications but all she really wants is a nebulizer to use instead of the rescue inhaler. She doesn't feel like she should come back in for this because she is seeing Tammy on Monday. A burse advised her she would have to come back in to see MW for this change. Please advise.   Hal Neer [place]  to Tanda Rockers, MD       3:46 PM  According to your nurse, you are requesting me to come in for an office visit and I am to bring all my meds. This is in response to my wanting a nebulizer and/or zpack. I will call your office on Monday to make an appointment , even though I was just there. I am confused as to why I need to bring all my meds, especially since we discussed this at my last visit. Thank you.

## 2017-09-22 NOTE — Telephone Encounter (Signed)
duoneb qid (she should take it prn though insurance sometimes balks when we write it that way)  #120 vials 2 refills

## 2017-09-22 NOTE — Telephone Encounter (Signed)
I gathered from the message there was a severe acute reason(failing to respond to the medicines already listed) she was requesting neb  so I wanted to troubleshoot the problem and show her personally how/ when to use the neb.  I'm fine with trying to do this over the phone as long as she keeps appt to see Tammy with all meds in hand - it is a bad sign if needing neb to get by (that's the paramedics and ER use) and we need to work a lot closer to optimize her care short of this "last resort"  Or PLAN C >>>   the albuterol puffer still stays as Plan B) and only should use C if B is tried first and doesn't work

## 2017-09-23 ENCOUNTER — Encounter: Payer: Self-pay | Admitting: Internal Medicine

## 2017-09-29 ENCOUNTER — Telehealth: Payer: Self-pay | Admitting: *Deleted

## 2017-09-29 ENCOUNTER — Encounter: Payer: Self-pay | Admitting: Adult Health

## 2017-09-29 ENCOUNTER — Ambulatory Visit (INDEPENDENT_AMBULATORY_CARE_PROVIDER_SITE_OTHER): Payer: Medicare Other | Admitting: Adult Health

## 2017-09-29 DIAGNOSIS — J449 Chronic obstructive pulmonary disease, unspecified: Secondary | ICD-10-CM | POA: Diagnosis not present

## 2017-09-29 DIAGNOSIS — R05 Cough: Secondary | ICD-10-CM

## 2017-09-29 DIAGNOSIS — R059 Cough, unspecified: Secondary | ICD-10-CM

## 2017-09-29 DIAGNOSIS — Z23 Encounter for immunization: Secondary | ICD-10-CM

## 2017-09-29 NOTE — Progress Notes (Signed)
@Patient  ID: [name], female    DOB: July 13, 1946, 71 y.o.   MRN: [id number]  Chief Complaint  Patient presents with  . Follow-up    COPD     Referring provider: [name], MD  HPI: 71 yo female former smoker followed for GOLD II COPD and chronic cough .   TEST  Spirometry 4/16/2018FEV1 1.13 (58%) Ratio 52 p saba w/in 4 h >add symb 80 2bid  allergy profile 03/10/14  >  IgE 18 with neg RAST - sinus ct 10/25/2014 > Clear sinuses. - try off cozar 11/03/2014 >>> - neurontin 100 tid rx 11/30/14 > improved 01/03/2015 > changed to lyrica 04/2015 but not taking consistently as of 01/30/2016  - sinus CT 02/17/15 > neg  CT chest 09/10/2017 . Negative for PE, 7.5 mm groundglass nodule in the right middle lobe, stable since 2015.  .09/29/2017 Follow up : COPD /cough /Lung nodule  Patient returns for a two-week follow-up. Patient was recently seen after COPD exacerbation. She was admitted early last month. Treated with nebs and steroids. She is feeling better with less cough and wheezing . Feels she is returning to baseline.  Cough is better. She is using tessalon . On occasion uses oxycodone for severe cough . Pt education on narcotic use.  Patient's medications were reviewed in detail and organized in a medication calendar Patient education was given. Patient would like to get her flu shot today.   Allergies  Allergen Reactions  . Other Itching    HAIR DYE    Immunization History  Administered Date(s) Administered  . Influenza Split 09/29/2013, 09/30/2015  . Influenza Whole 08/30/2016  . Influenza,inj,Quad PF,6+ Mos 09/12/2014  . Pneumococcal-Unspecified 10/05/2013    Past Medical History:  Diagnosis Date  . COPD (chronic obstructive pulmonary disease) ([place])   . Diabetes ([place])   . High cholesterol   . Hypertension   . Pneumonia 09/10/2017    Tobacco History: History  Smoking Status  . Former Smoker  . Packs/day: 0.25  . Years: 42.00  . Types: Cigarettes    . Quit date: 01/30/2006  Smokeless Tobacco  . Never Used   Counseling given: Not Answered   Outpatient Encounter Prescriptions as of 09/29/2017  Medication Sig  . albuterol (PROVENTIL HFA;VENTOLIN HFA) 108 (90 Base) MCG/ACT inhaler Inhale 1-2 puffs into the lungs every 6 (six) hours as needed for wheezing or shortness of breath.  Marland Kitchen aspirin 81 MG tablet Take 81 mg by mouth daily.   . BD PEN NEEDLE NANO U/F 32G X 4 MM MISC 1 each 3 (three) times daily.  . benzonatate (TESSALON) 200 MG capsule TAKE 1 CAPSULE (200 MG TOTAL) BY MOUTH 3 (THREE) TIMES DAILY AS NEEDED FOR COUGH.  . Biotin 2500 MCG CAPS Take 1 capsule by mouth daily.  . bisoprolol (ZEBETA) 5 MG tablet TAKE 1/2 TAB BY MOUTH DAILY (Patient taking differently: TAKE 2.5 mg BY MOUTH DAILY)  . budesonide-formoterol (SYMBICORT) 80-4.5 MCG/ACT inhaler Take 2 puffs first thing in am and then another 2 puffs about 12 hours later.  Marland Kitchen CALCIUM PO Take 1 tablet by mouth daily.  . chlorpheniramine (CHLOR-TRIMETON) 4 MG tablet Take 4 mg by mouth 2 (two) times daily. Add 1 every 4 hours as needed for drainage, throat clearing  . Cholecalciferol (VITAMIN D3) 2000 UNITS TABS Take 2,000 Units by mouth daily.  . famotidine (PEPCID) 20 MG tablet TAKE 1 TABLET (20 MG TOTAL) BY MOUTH AT BEDTIME.  Marland Kitchen ipratropium-albuterol (DUONEB) 0.5-2.5 (3) MG/3ML SOLN  Take 3 mLs by nebulization every 6 (six) hours as needed. DX: J44.9  . liraglutide (VICTOZA) 18 MG/3ML SOPN Inject 0.3 mLs (1.8 mg total) into the skin at bedtime.  Marland Kitchen LYRICA 75 MG capsule Take 75 mg by mouth daily as needed (for pain).   . montelukast (SINGULAIR) 10 MG tablet TAKE 1 TABLET (10 MG TOTAL) BY MOUTH AT BEDTIME.  Marland Kitchen oxyCODONE (ROXICODONE) 5 MG immediate release tablet Take 1 tablet (5 mg total) by mouth every 4 (four) hours as needed for severe pain.  . pantoprazole (PROTONIX) 40 MG tablet TAKE 1 TABLET EVERY DAY 30-60 MINUTES BEFORE FIRST MEAL OF THE DAY  . predniSONE (DELTASONE) 10 MG tablet Take   4 each am x 2 days,   2 each am x 2 days,  1 each am x 2 days and stop  . rosuvastatin (CRESTOR) 20 MG tablet Take 20 mg by mouth every evening.   . vitamin C (ASCORBIC ACID) 500 MG tablet Take 500 mg by mouth daily.   No facility-administered encounter medications on file as of 09/29/2017.      Review of Systems  Constitutional:   No  weight loss, night sweats,  Fevers, chills, fatigue, or  lassitude.  HEENT:   No headaches,  Difficulty swallowing,  Tooth/dental problems, or  Sore throat,                No sneezing, itching, ear ache, nasal congestion, post nasal drip,   CV:  No chest pain,  Orthopnea, PND, swelling in lower extremities, anasarca, dizziness, palpitations, syncope.   GI  No heartburn, indigestion, abdominal pain, nausea, vomiting, diarrhea, change in bowel habits, loss of appetite, bloody stools.   Resp:    No chest wall deformity  Skin: no rash or lesions.  GU: no dysuria, change in color of urine, no urgency or frequency.  No flank pain, no hematuria   MS:  No joint pain or swelling.  No decreased range of motion.  No back pain.    Physical Exam  BP 102/64 (BP Location: Left Arm, Cuff Size: Normal)   Pulse 95   Ht 5\' 5"  (1.651 m)   Wt 186 lb 3.2 oz (84.5 kg)   SpO2 95%   BMI 30.99 kg/m   GEN: A/Ox3; pleasant , NAD, obese    HEENT:  [place]/AT,  EACs-clear, TMs-wnl, NOSE-clear, THROAT-clear, no lesions, no postnasal drip or exudate noted.   NECK:  Supple w/ fair ROM; no JVD; normal carotid impulses w/o bruits; no thyromegaly or nodules palpated; no lymphadenopathy.    RESP  Clear  P & A; w/o, wheezes/ rales/ or rhonchi. no accessory muscle use, no dullness to percussion  CARD:  RRR, no m/r/g, no peripheral edema, pulses intact, no cyanosis or clubbing.  GI:   Soft & nt; nml bowel sounds; no organomegaly or masses detected.   Musco: Warm bil, no deformities or joint swelling noted.   Neuro: alert, no focal deficits noted.    Skin: Warm, no lesions or  rashes    Lab Results:  CBC  BMET   BNP No results found for: BNP  ProBNP  Imaging: Ct Angio Chest Pe W Or Wo Contrast  Result Date: 09/10/2017 CLINICAL DATA:  Shortness of breath and persistent cough. EXAM: CT ANGIOGRAPHY CHEST WITH CONTRAST TECHNIQUE: Multidetector CT imaging of the chest was performed using the standard protocol during bolus administration of intravenous contrast. Multiplanar CT image reconstructions and MIPs were obtained to evaluate the vascular anatomy. CONTRAST:  100 cc Isovue 370 COMPARISON:  03/09/2014 FINDINGS: Cardiovascular: The heart is normal in size. No pericardial effusion. Moderate tortuosity, ectasia and calcification of the thoracic aorta and branch vessels including the coronary arteries. No aortic aneurysm or dissection. The pulmonary arterial tree is well opacified. No filling defects to suggest pulmonary embolism. Mediastinum/Nodes: No mediastinal or hilar mass or lymphadenopathy. The esophagus is grossly normal. Lungs/Pleura: No significant pulmonary findings. Left basilar scarring changes are stable. Mild eventration of the left hemidiaphragm. No worrisome pulmonary lesions or acute pulmonary findings. Ache ground-glass nodule in the right middle lobe adjacent to the minor fissure, best seen on series 6, image 81 and series 9, image 55. This appears stable when compared to the prior examination of 2015. It measures a maximum of 7 mm and I would recommend a followup noncontrast chest CT scan 1-2 years to document 5 years of stability. Upper Abdomen: No significant upper abdominal findings. Moderate abdominal aortic and branch vessel calcifications. Musculoskeletal: No breast masses, supraclavicular or axillary lymphadenopathy. The thyroid gland is grossly normal. No significant bony findings. Review of the MIP images confirms the above findings. IMPRESSION: 1. No CT findings for pulmonary embolism. 2. Stable moderate atherosclerotic calcifications involving  the abdominal and thoracic aorta and branch vessels including the coronary arteries. 3. No acute pulmonary findings. 4. 7.5 mm ground-glass nodule in the right middle lobe appears stable since 2015. Recommend follow-up noncontrast chest CT in 1-2 years to document 5 years of stability. Aortic Atherosclerosis (ICD10-I70.0). Electronically Signed   By: Marijo Sanes M.D.   On: 09/10/2017 13:40   Dg Chest Port 1 View  Result Date: 09/12/2017 CLINICAL DATA:  Acute respiratory failure EXAM: PORTABLE CHEST 1 VIEW COMPARISON:  Chest radiograph from one day prior. FINDINGS: Stable cardiomediastinal silhouette with normal heart size. No pneumothorax. No pleural effusion. No pulmonary edema. Stable blunting of the left costophrenic angle with associated mild hazy opacity compatible with scarring as seen on the recent chest CT. No acute consolidative airspace disease. IMPRESSION: Stable pleural-parenchymal scarring at the left lung base. No acute cardiopulmonary disease. Electronically Signed   By: Ilona Sorrel M.D.   On: 09/12/2017 07:06   Dg Chest Port 1 View  Result Date: 09/11/2017 CLINICAL DATA:  Pneumonia. EXAM: PORTABLE CHEST 1 VIEW COMPARISON:  CT 09/10/2017.  Chest x-ray 09/10/2017, 09/28/2016. FINDINGS: Mediastinum hilar structures normal. Heart size stable. Lungs are clear of acute infiltrates. Left base pleural-parenchymal thickening again noted consistent with scarring . No pleural effusion or pneumothorax . IMPRESSION: Left base pleural-parenchymal thickening again noted consistent scarring. No acute cardiopulmonary disease. Electronically Signed   By: Marcello Moores  Register   On: 09/11/2017 06:41   Dg Chest Port 1 View  Result Date: 09/10/2017 CLINICAL DATA:  Cough and shortness of Breath EXAM: PORTABLE CHEST 1 VIEW COMPARISON:  04/14/2017 FINDINGS: Cardiac shadow is within normal limits. Aortic calcifications are again seen. The lungs are well aerated bilaterally with chronic blunting of left costophrenic  angle. Mild increased density is noted in the left base which may be in part due to overlying soft tissue although early infiltrate cannot be excluded. No bony abnormality is noted. IMPRESSION: Increased density in the left base as described. This likely represents a combination overlying soft tissues and early infiltrate. Electronically Signed   By: Inez Catalina M.D.   On: 09/10/2017 07:49     Assessment & Plan:   COPD GOLD II  Recent flare now resolved.  Compensated on present regimen  Flu shot today  Plan  Patient Instructions  Continue on current regimen .  Follow med calendar closely and bring to each visit.  Flu shot today  Follow up with Dr. [name]  In 3-4 months and As needed      Cough Slow to resolve, improving with cough control and trigger prevention  Would limit narcotic use going forward if able  Pt education given .  Patient's medications were reviewed today and patient education was given. Computerized medication calendar was adjusted/completed       Rexene Edison, NP 09/29/2017

## 2017-09-29 NOTE — Telephone Encounter (Signed)
-----   Message from Tanda Rockers, MD sent at 09/29/2017  9:59 AM EDT ----- Needs to ov with med calendar in hand in 4 weeks to be sure she's staying better this time

## 2017-09-29 NOTE — Telephone Encounter (Signed)
LMTCB

## 2017-09-29 NOTE — Assessment & Plan Note (Signed)
Recent flare now resolved.  Compensated on present regimen  Flu shot today   Plan  Patient Instructions  Continue on current regimen .  Follow med calendar closely and bring to each visit.  Flu shot today  Follow up with Dr. [name]  In 3-4 months and As needed

## 2017-09-29 NOTE — Patient Instructions (Signed)
Continue on current regimen .  Follow med calendar closely and bring to each visit.  Flu shot today  Follow up with Dr. [name]  In 3-4 months and As needed

## 2017-09-29 NOTE — Assessment & Plan Note (Addendum)
Slow to resolve, improving with cough control and trigger prevention  Would limit narcotic use going forward if able  Pt education given .  Patient's medications were reviewed today and patient education was given. Computerized medication calendar was adjusted/completed

## 2017-09-29 NOTE — Progress Notes (Signed)
Chart and office note reviewed in detail  > agree with a/p as outlined    

## 2017-09-30 NOTE — Telephone Encounter (Signed)
ATC, NA and no option to leave msg 

## 2017-09-30 NOTE — Addendum Note (Signed)
Addended by: Parke Poisson E on: 09/30/2017 11:15 AM   Modules accepted: Orders

## 2017-10-01 ENCOUNTER — Ambulatory Visit: Payer: Medicare Other | Admitting: Internal Medicine

## 2017-10-01 DIAGNOSIS — R911 Solitary pulmonary nodule: Secondary | ICD-10-CM | POA: Diagnosis not present

## 2017-10-01 DIAGNOSIS — E1165 Type 2 diabetes mellitus with hyperglycemia: Secondary | ICD-10-CM | POA: Diagnosis not present

## 2017-10-01 DIAGNOSIS — J449 Chronic obstructive pulmonary disease, unspecified: Secondary | ICD-10-CM | POA: Diagnosis not present

## 2017-10-01 DIAGNOSIS — Z6830 Body mass index (BMI) 30.0-30.9, adult: Secondary | ICD-10-CM | POA: Diagnosis not present

## 2017-10-02 ENCOUNTER — Telehealth: Payer: Self-pay | Admitting: Internal Medicine

## 2017-10-02 ENCOUNTER — Encounter: Payer: Self-pay | Admitting: Internal Medicine

## 2017-10-02 NOTE — Telephone Encounter (Signed)
I have called and lmom for [facility] to call us back to get the order for the nebulizer cancelled.  Will hold this message until they call back. I sent an email back to the pt to make her aware.

## 2017-10-02 NOTE — Telephone Encounter (Signed)
See phone note from 10/02/17 regarding cancelling order from [facility].

## 2017-10-02 NOTE — Telephone Encounter (Signed)
Spoke with Lovena Le with [facility]. Explained to her that the patient had sent [place] an email stating that she has received a nebulizer from Dr. Baird Cancer and no longer needs one from [facility]. Lovena Le stated that [facility] had been trying to contact her with no luck. Lovena Le will go ahead and cancel the order. Nothing else needed at time of call.   Will send the patient a MyChart message advising her that the order has been cancelled at her request.

## 2017-10-06 NOTE — Telephone Encounter (Signed)
LMTCB

## 2017-10-07 NOTE — Telephone Encounter (Signed)
LMTCB and will close per protocol  

## 2017-10-14 DIAGNOSIS — E119 Type 2 diabetes mellitus without complications: Secondary | ICD-10-CM | POA: Diagnosis not present

## 2017-10-14 DIAGNOSIS — H10413 Chronic giant papillary conjunctivitis, bilateral: Secondary | ICD-10-CM | POA: Diagnosis not present

## 2017-10-14 DIAGNOSIS — H40013 Open angle with borderline findings, low risk, bilateral: Secondary | ICD-10-CM | POA: Diagnosis not present

## 2017-10-14 DIAGNOSIS — H2513 Age-related nuclear cataract, bilateral: Secondary | ICD-10-CM | POA: Diagnosis not present

## 2017-12-13 ENCOUNTER — Other Ambulatory Visit: Payer: Self-pay | Admitting: Internal Medicine

## 2017-12-24 DIAGNOSIS — M25562 Pain in left knee: Secondary | ICD-10-CM | POA: Diagnosis not present

## 2018-01-03 DIAGNOSIS — I10 Essential (primary) hypertension: Secondary | ICD-10-CM | POA: Diagnosis not present

## 2018-01-03 DIAGNOSIS — R002 Palpitations: Secondary | ICD-10-CM | POA: Diagnosis not present

## 2018-01-12 ENCOUNTER — Telehealth: Payer: Self-pay | Admitting: Internal Medicine

## 2018-01-12 MED ORDER — BUDESONIDE-FORMOTEROL FUMARATE 80-4.5 MCG/ACT IN AERO: 2.0000 | INHALATION_SPRAY | Freq: Two times a day (BID) | RESPIRATORY_TRACT | 0 refills | Status: DC

## 2018-01-12 NOTE — Telephone Encounter (Signed)
One sample of symbicort 80 in office.  Sample left up front for pt.  Pt aware.  Nothing further needed.

## 2018-02-16 ENCOUNTER — Other Ambulatory Visit: Payer: Self-pay | Admitting: Internal Medicine

## 2018-02-27 DIAGNOSIS — I10 Essential (primary) hypertension: Secondary | ICD-10-CM | POA: Diagnosis not present

## 2018-02-27 DIAGNOSIS — J320 Chronic maxillary sinusitis: Secondary | ICD-10-CM | POA: Diagnosis not present

## 2018-02-27 DIAGNOSIS — J441 Chronic obstructive pulmonary disease with (acute) exacerbation: Secondary | ICD-10-CM | POA: Diagnosis not present

## 2018-02-27 DIAGNOSIS — Z79899 Other long term (current) drug therapy: Secondary | ICD-10-CM | POA: Diagnosis not present

## 2018-03-18 ENCOUNTER — Other Ambulatory Visit: Payer: Self-pay | Admitting: Internal Medicine

## 2018-03-23 ENCOUNTER — Ambulatory Visit (INDEPENDENT_AMBULATORY_CARE_PROVIDER_SITE_OTHER): Payer: Medicare Other | Admitting: Internal Medicine

## 2018-03-23 ENCOUNTER — Encounter: Payer: Self-pay | Admitting: Internal Medicine

## 2018-03-23 VITALS — BP 118/76 | HR 90 | Ht 66.0 in | Wt 188.0 lb

## 2018-03-23 DIAGNOSIS — J449 Chronic obstructive pulmonary disease, unspecified: Secondary | ICD-10-CM | POA: Diagnosis not present

## 2018-03-23 DIAGNOSIS — J310 Chronic rhinitis: Secondary | ICD-10-CM

## 2018-03-23 MED ORDER — BUDESONIDE-FORMOTEROL FUMARATE 160-4.5 MCG/ACT IN AERO: 2.0000 | INHALATION_SPRAY | Freq: Two times a day (BID) | RESPIRATORY_TRACT | 11 refills | Status: DC

## 2018-03-23 MED ORDER — PANTOPRAZOLE SODIUM 40 MG PO TBEC: DELAYED_RELEASE_TABLET | ORAL | 11 refills | Status: DC

## 2018-03-23 MED ORDER — MONTELUKAST SODIUM 10 MG PO TABS: ORAL_TABLET | ORAL | 11 refills | Status: DC

## 2018-03-23 NOTE — Assessment & Plan Note (Signed)
Allergy profile 02/28/14-   total IgE 18.5 with Neg RAST   Continue gerd rx / diet and 1st gen H1 blockers per guidelines

## 2018-03-23 NOTE — Patient Instructions (Addendum)
Plan A = Automatic = symbicort 160 Take 2 puffs first thing in am and then another 2 puffs about 12 hours later.    Plan B = Backup Only use your albuterol as a rescue medication to be used if you can't catch your breath by resting or doing a relaxed purse lip breathing pattern.  - The less you use it, the better it will work when you need it. - Ok to use the inhaler up to 2 puffs  every 4 hours if you must but call for appointment if use goes up over your usual need - Don't leave home without it !!  (think of it like the spare tire for your car)   Plan C = Crisis - only use your albuterol nebulizer if you first try Plan B and it fails to help > ok to use the nebulizer up to every 4 hours but if start needing it regularly call for immediate appointment   See calendar for specific medication instructions and bring it back for each and every office visit for every healthcare provider you see.  Without it,  you may not receive the best quality medical care that we feel you deserve.  You will note that the calendar groups together  your maintenance  medications that are timed at particular times of the day.  Think of this as your checklist for what your doctor has instructed you to do until your next evaluation to see what benefit  there is  to staying on a consistent group of medications intended to keep you well.  The other group at the bottom is entirely up to you to use as you see fit  for specific symptoms that may arise between visits that require you to treat them on an as needed basis.  Think of this as your action plan or "what if" list.   Separating the top medications from the bottom group is fundamental to providing you adequate care going forward.    Please schedule a follow up office visit in 4 weeks, sooner if needed - no need to bring your meds as long as they correlate 100% with the med calendar we provided you today.

## 2018-03-23 NOTE — Assessment & Plan Note (Signed)
Quit smoking 2007 PFT: 03/17/2014-  FEV1  1.28 (58%) ratio 66 and no better p saba, ERV 27 and DLCO 56 corrects to 77 - 11/30/2014   try dulera 100 2bid > 01/03/2015 not clear helping,  use prn > flare since mid 02/2015 cough > sob  - 04/07/2015 p extensive coaching HFA effectiveness =    90% > try dulera 100 2bid > improved 04/21/15   - 06/01/2015   try dulera 200 2bid  > did not maintain  - Spirometry 04/14/2017  FEV1 1.13 (58%)  Ratio 52 p saba w/in 4 h > add symb 80 2bid   - 08/19/2017    resumed sym 80 2bid  -  03/23/2018  After extensive coaching inhaler device  effectiveness =    90% > try symb 160 due to new noct cough/ wheeze    I had an extended discussion with the patient reviewing all relevant studies completed to date and  lasting 25 minutes of a 40  minute acute office  visit addressing new  Pattern of severe non-specific but potentially very serious refractory respiratory symptoms of uncertain and potentially multiple  Etiologies and the following concerns:   1) 1st step with each of is to get a full accurate accounting of everything she takes and we tried to simplify this with ov with NP for med cal but she is not following it, didn't bring it, and when she came in for this ov did not bring all meds as req   2)  Each maintenance medication was reviewed in detail including most importantly the difference between maintenance and as needed and under what circumstances the prns are to be used. This was done in the context of a newly generated medication calendar review which provided the patient with a user-friendly unambiguous mechanism for medication administration and reconciliation and provides an action plan for all active problems. It is critical that this be shown to every doctor  for modification during the office visit if necessary so the patient can use it as a working document.       3) If not willing to meet [place] half way will not be able to continue to provide her quality medical care  thru this office or keep her out of the ER, which was one of her main concerns    F/u needs to be every 4 weeks until we get her squared away and address all her chronic and now acute complaints      .

## 2018-03-23 NOTE — Progress Notes (Signed)
Subjective:   Patient ID: [name], female    DOB: 1946/05/30    MRN: [id number]    Brief patient profile:  72  yobf mother of a PA in [place] quit smoking 2007 with chronic cough x 2011 self referred for evaluation of persistent cough in setting of technically GOLD II copd dx 02/2014 with only mild obstructive pattern - has noted since high school a tendency to tickle in throat causing intermittent cough waxing and waning ever since    History of Present Illness  10/19/2014 1st [facility] office visit/ Khaalid Lefkowitz   Chief Complaint  Patient presents with  . Pulmonary Consult    Former pt of Dr. Annamaria Boots. Pt c/o increased cough x 3 wks. Cough is prod with moderate, thick, light yellow sputum.  She coughs until loses urinary continence.  Not sleeping well due to cough.   remembers having tendency to throat tickle in high school  but completely resolved by graduation  then recurred in her 72s and resolved with codeine then recurred in 2011  but bad to worse x 3 weeks assoc with sensation of choking, mucus is thick light yellow.  acei stopped about a month prior to OV  Replaced by losartan  Inhalers make it worse, esp dpi  Kouffman Reflux v Neurogenic Cough Differentiator Reflux Comments  Do you awaken from a sound sleep coughing violently?                            With trouble breathing? Yes   Do you have choking episodes when you cannot  Get enough air, gasping for air ?              Yes   Do you usually cough when you lie down into  The bed, or when you just lie down to rest ?                          Yes   Do you usually cough after meals or eating?         maybe   Do you cough when (or after) you bend over?    No    GERD SCORE     Kouffman Reflux v Neurogenic Cough Differentiator Neurogenic   Do you more-or-less cough all day long? yes   Does change of temperature make you cough? no   Does laughing or chuckling cause you to cough? no   Do fumes (perfume, automobile fumes, burned   Toast, etc.,) cause you to cough ?      Not aware   Does speaking, singing, or talking on the phone cause you to cough   ?               sometimes   Neurogenic/Airway score    rec  Max rx gerd/prn tramadol     11/30/2014 f/u ov/Jaeanna Mccomber re: chronic cough  Chief Complaint  Patient presents with  . Acute Visit    Pt states that her cough is no better since last visit here 11/18/14. She also c/o frequent throat clearing and "rattling in throat". She states that there have been 3 occasions since last visit where she woke up from sleeping with cough and "felt like choking".   Overall pattern has not improved on rx unless includes tramadol  though not clear that she's been able to take the complex regimen  We've recommended and has  not yet started the prednisone that was recommended over the phone  rec Start dulera 100 Take 2 puffs first thing in am and then another 2 puffs about 12 hours later.  Work on inhaler technique:  Get the prednisone right away For cough > mucinex dm up to 1200 mg twice daily and supplement with tramadol 50 mg up to 2 every 4 hours if needed  Change neurontin to 100 three times daily     01/03/2015 f/u ov/Athira Janowicz re: cough since 2011/ GOLD II COPD / tickle since childhood  Chief Complaint  Patient presents with  . Follow-up    cough has resolved; no concerns today   prednisone may have helped  This time / used it with tramadol but no more tramadol x weeks and no flare  May have caught cold x  sev days but so far just controlling cough with candy/ ? Better on dulera > note sure, may have caused choking  Stopped acid suppression/  dulera but still on neurontin 100 tid rec Stay on neurontin (gabapentin) three times a day indefinitely  At first sign of flare > Pantoprazole (protonix) 40 mg   Take 30-60 min before first meal of the day and Pepcid 20 mg one bedtime until gone and return if needed If all else fails and you are having trouble breathing try dulera 100 up to 2  every 12 hours  F/u is prn     04/14/2017 acute extended ov/Retha Bither re:  Copd II  On proair bid as only "maint"  Chief Complaint  Patient presents with  . Follow-up    Pt states her breathing is ok, pt is still coughing, the cough is productive with thick yellow mucus,pt is wheezing at times Denies chest tightness,fever   wheeze better p uses proair  Totally off the regimen that previously improved the cough and focused on her cough medication refills with narcotic containing meds  Had med calendar but not updating/ using Doe = MMRC3 = can't walk 100 yards even at a slow pace at a flat grade s stopping due to sob   rec  Pantoprazole (protonix) 40 mg   Take  30-60 min before first meal of the day and continue  Pepcid (famotidine)  20 mg one @  bedtime until return to office   Take delsym two tsp every 12 hours and supplement if needed with  tramadol 50 mg up to 2 every 4 hours to suppress the urge to cough. Swallowing water or using ice chips/non mint and menthol containing candies (such as lifesavers or sugarless jolly ranchers) are also effective.  You should rest your voice and avoid activities that you know make you cough. Once you have eliminated the cough for 3 straight days try reducing the tramadol first,  then the delsym as tolerated.   Prednisone 10 mg take  4 each am x 2 days,   2 each am x 2 days,  1 each am x 2 days and stop  Plan A = Automatic = symbicort 80 Take 2 puffs first thing in am and then another 2 puffs about 12 hours later.  Plan B = Backup Only use your albuterol as a rescue medication See Tammy NP w/in 2 weeks (or next available after that) with all your medications, even over the counter meds, separated in two separate bags, the ones you take no matter what vs the ones you stop once you feel better and take only as needed when you feel you need  them.   Tammy  will generate for you a new user friendly medication calendar that will put [place] all on the same page re: your  medication use.   Please remember to go to the  x-ray department downstairs in the basement  for your tests - we will call you with the results when they are available. Add: Augmentin 875 mg take one pill twice daily  X 10 days - take at breakfast and supper with large glass of water.  It would help reduce the usual side effects (diarrhea and yeast infections) if you ate cultured yogurt at lunch.     04/28/17 NP f/u ov much better rec  Med calendar/ no change     08/19/2017  Acute extended  ov/Aquan Kope re: GOLD II copd/ chronic cough no longer on symbicort Chief Complaint  Patient presents with  . Acute Visit    Pt states that she started coughing 08/04/17 and it has gradually become worse. Pt also c/o SOB and feels like someone is sitting on her stomach.  Sleeping had been doing fine p h1 hs but still some daytime throat clearing then much worse since aug 6 not using  action plan on med calendar: did not bring/ not on symbicort, says pred is the only thing that helps this type of cough which is dry unless coughs so hard vomits / gags and loses her breath and speech rec Prednisone 10 mg take  4 each am x 2 days,   2 each am x 2 days,  1 each am x 2 days and stop  Restart symbicort 80 Take 2 puffs first thing in am and then another 2 puffs about 12 hours later.  For cough >  Benzoate first then oxycodone supplement up to every 4 hours as needed      Admit date: 09/10/2017 Discharge date: 09/14/2017     Brief/Interim Summary: 72 year old female with history of hypertension, diabetes, COPD, chronic cough follows up with Dr. [name] presented 9/12 with c/o SOB, cough, back pain. patient was admitted by pulmonary critical care for COPD exacerbation.   #Acute respiratory failure with hypoxia due to acute exacerbation of COPD: -Treated with bronchodilator, Solu-Medrol with clinical improvement. Patient is off BiPAP and she is not hypoxic. CT scan of chest negative for PE. Recommended to continue  inhalers and follow up with her pulmonologist. Prescription for albuterol provided. Patient will be discharged with tapering dose of prednisone.  -Patient has a stable right middle lobe lung nodule. Recommended outpatient follow-up. -pro-calcitonin not elevated.  #Diabetes: A1c 6.1. Continue Victoza. Does verified with the patient. Recommended to follow-up with PCP.  #Hyperlipidemia: Continue statin.  # Leukocytosis due to steroid. Patient is afebrile. Cultures negative.  #Diarrhea: C. difficile negative. Abdomen exam benign. Clinically improved.  Patient was significant clinical improvement. Able to ambulate without difficulties. No shortness of breath. Has minimal wheezing on physical exam. Patient will be discharged with oral prednisone and inhalers. She has a good follow-up with her pulmonologist.  Discharge Diagnoses:  Active Problems:   Acute respiratory failure ([facility])   Community acquired pneumonia of left lower lobe of lung ([facility])> ruled out by CTa   Diarrhea     09/15/2017  Transition of care  f/u ov/Lunetta Marina re: COPD II s/p aecopd Chief Complaint  Patient presents with  . Hospitalization Follow-up    Breathing has improved some, but not back at her normal baseline. She states her cough woke her up this am and had some bloody nasal d/c.  She has been coughing up some thick, yellow sputum.  She is using   walking all over [place] fine,  Flew back to [place]  on a commercial jet and short of breath  walking from gate [place] then by 2 days later in ER due to severe sob / tightness across upper back >> albuterol relived the discomfort w/in 5 min but did not have her rescue so had to call 911 (proair listed on med calendar she just got from me 08/19/17 under written action plan) - says was so short of breath at that point could not have used saba anyway. Not following action plan either re controlling cough which is also worse/ has not started meds from discharge  rec Plan  A = Automatic = symbicort 80 Take 2 puffs first thing in am and then another 2 puffs about 12 hours later.  Work on Interior and spatial designer: Plan B = Backup Only use your albuterol as a rescue medication   10/1 /18 NP eval rec Follow med calendar/ no change rx    03/23/2018 acute extended  ov/Germaine Ripp re:   Copd  - extended ov re new noct cough /wheeze and need for saba / no med calendar Chief Complaint  Patient presents with  . Acute Visit    Breathing is overall doing well. She uses her albuterol inhaler 4 x per wk on average. She has not needed her neb.  Mid February breathing /coughing  worse / wheezing every noct needs albuterol every noct Very confused with instructions/ now has neb but did not disclose med she uses at the med calendar ov and did not bring the med calendar with her nor the med for the nebulizer and easily frustrated re concept of med reconciliation  Daytime sob = MMRC2 = can't walk a nl pace on a flat grade s sob but does fine slow and flat   No obvious other patterns in day to day or daytime variability or assoc excess/ purulent sputum or mucus plugs or hemoptysis or cp or chest tightness, subjective wheeze or overt sinus or hb symptoms. No unusual exposure hx or h/o childhood pna/ asthma or knowledge of premature birth.    Also denies any obvious fluctuation of symptoms with weather or environmental changes or other aggravating or alleviating factors except as outlined above   Current Allergies, Complete Past Medical History, Past Surgical History, Family History, and Social History were reviewed in [employer] record.  ROS  The following are not active complaints unless bolded Hoarseness, sore throat, dysphagia, dental problems, itching, sneezing,  nasal congestion or discharge of excess mucus or purulent secretions, ear ache,   fever, chills, sweats, unintended wt loss or wt gain, classically pleuritic or exertional cp,  orthopnea pnd or  leg swelling, presyncope, palpitations, abdominal pain, anorexia, nausea, vomiting, diarrhea  or change in bowel habits or change in bladder habits, change in stools or change in urine, dysuria, hematuria,  rash, arthralgias, visual complaints, headache, numbness, weakness or ataxia or problems with walking or coordination,  change in mood/affect or memory.        Current Meds  Medication Sig  . albuterol (PROAIR HFA) 108 (90 Base) MCG/ACT inhaler Inhale 2 puffs into the lungs every 4 (four) hours as needed for wheezing or shortness of breath.  Marland Kitchen aspirin 81 MG tablet Take 81 mg by mouth daily.   . benzonatate (TESSALON) 200 MG capsule TAKE 1 CAPSULE (200 MG TOTAL) BY MOUTH 3 (  THREE) TIMES DAILY AS NEEDED FOR COUGH.  . bisoprolol (ZEBETA) 5 MG tablet TAKE 1/2 TAB BY MOUTH DAILY (Patient taking differently: TAKE 2.5 mg BY MOUTH DAILY)  . CALCIUM-MAGNESIUM-ZINC PO Take 1 capsule by mouth daily.  . chlorpheniramine (CHLOR-TRIMETON) 4 MG tablet Take 4 mg by mouth 2 (two) times daily. Add 1 every 4 hours as needed for drainage, throat clearing  . Cholecalciferol (VITAMIN D3) 2000 UNITS TABS Take 2,000 Units by mouth daily.  . famotidine (PEPCID) 20 MG tablet TAKE 1 TABLET (20 MG TOTAL) BY MOUTH AT BEDTIME.  Marland Kitchen ipratropium-albuterol (DUONEB) 0.5-2.5 (3) MG/3ML SOLN Take 3 mLs by nebulization every 6 (six) hours as needed. DX: J44.9  . liraglutide (VICTOZA) 18 MG/3ML SOPN Inject 0.3 mLs (1.8 mg total) into the skin at bedtime.  Marland Kitchen LYRICA 75 MG capsule Take 75 mg by mouth 2 (two) times daily as needed (for pain).   . pantoprazole (PROTONIX) 40 MG tablet TAKE 1 TABLET EVERY DAY 30-60 MINUTES BEFORE FIRST MEAL OF THE DAY  . rosuvastatin (CRESTOR) 20 MG tablet Take 20 mg by mouth every evening.   . vitamin C (ASCORBIC ACID) 500 MG tablet Take 500 mg by mouth daily.  . [DISCONTINUED] budesonide-formoterol (SYMBICORT) 80-4.5 MCG/ACT inhaler Inhale 2 puffs into the lungs 2 (two) times daily.  . [DISCONTINUED]  pantoprazole (PROTONIX) 40 MG tablet TAKE 1 TABLET EVERY DAY 30-60 MINUTES BEFORE FIRST MEAL OF THE DAY             Objective:   Physical Exam  amb bf easily aggravated with details of care   04/07/2015 207  > 04/21/2015 208 > 06/01/2015  210>211 09/08/2015 > 01/30/2016   184 > 10/23/2016  190 > 04/14/2017   183 > 08/19/2017  185 >  03/23/2018  188    Vital signs reviewed - Note on arrival 02 sats  95% on RA   HEENT: nl dentition, turbinates bilaterally, and oropharynx. Nl external ear canals without cough reflex   NECK :  without JVD/Nodes/TM/ nl carotid upstrokes bilaterally   LUNGS: no acc muscle use,  Nl contour chest which is clear to A and P bilaterally without cough on insp or exp maneuvers   CV:  RRR  no s3 or murmur or increase in P2, and no edema   ABD:  soft and nontender with nl inspiratory excursion in the supine position. No bruits or organomegaly appreciated, bowel sounds nl  MS:  Nl gait/ ext warm without deformities, calf tenderness, cyanosis or clubbing No obvious joint restrictions   SKIN: warm and dry without lesions    NEURO:  alert, approp, nl sensorium with  no motor or cerebellar deficits apparent.

## 2018-03-26 ENCOUNTER — Other Ambulatory Visit: Payer: Self-pay | Admitting: Internal Medicine

## 2018-04-16 ENCOUNTER — Other Ambulatory Visit: Payer: Self-pay | Admitting: Internal Medicine

## 2018-04-16 IMAGING — MR MR CERVICAL SPINE W/O CM
4 of 5 series · 22 of 48 positions shown · non-contrast
Comparison: None.

CLINICAL DATA: Chronic progressive right arm pain.

EXAM:
MRI CERVICAL SPINE WITHOUT CONTRAST
TECHNIQUE: Multiplanar, multisequence MR imaging of the cervical spine was
performed. No intravenous contrast was administered.

[Series 2: T2 · sagittal · 3.0mm · 0.41mm/px · 6 of 13 slices shown (1 of 3)]
[im 1/13]
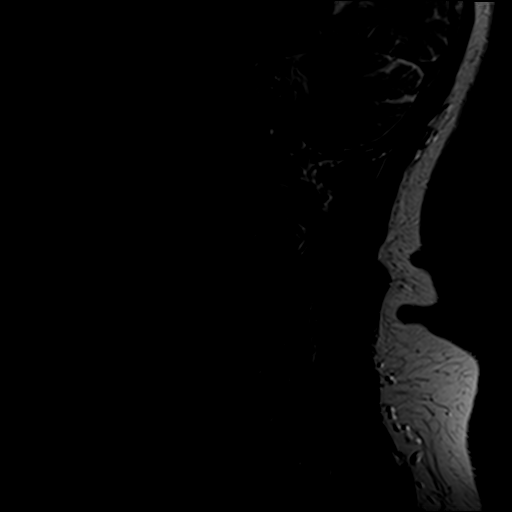
[im 3/13]
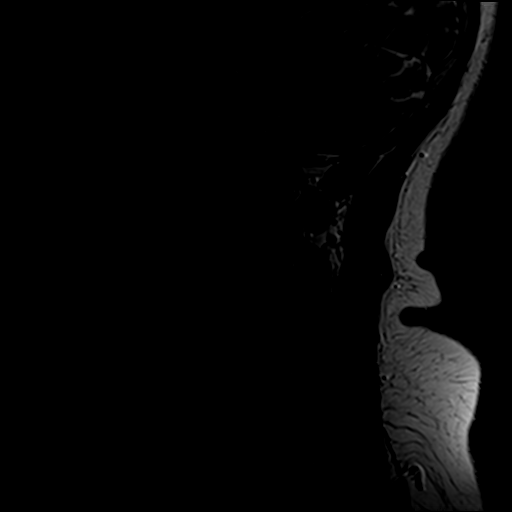
[im 5/13]
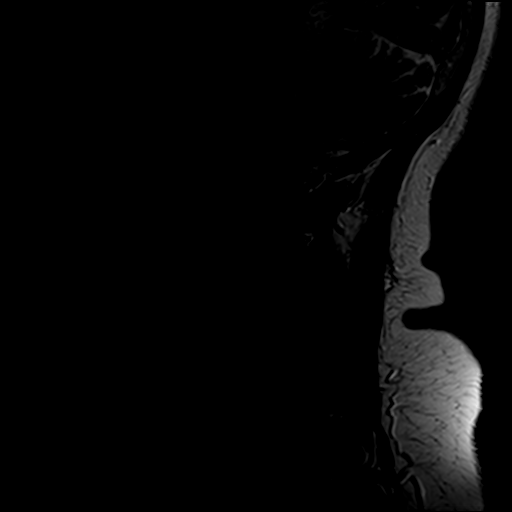
[im 8/13]
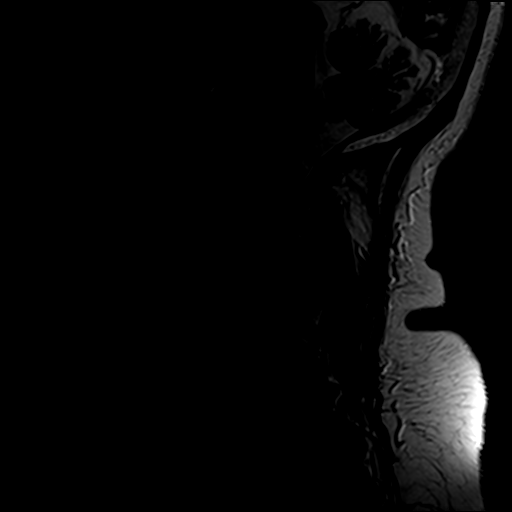
[im 10/13]
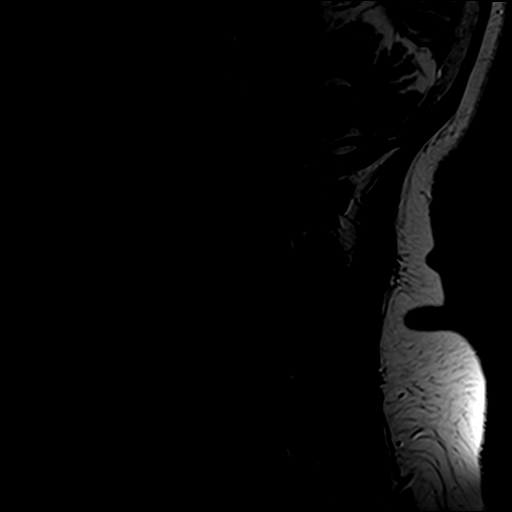
[im 13/13]
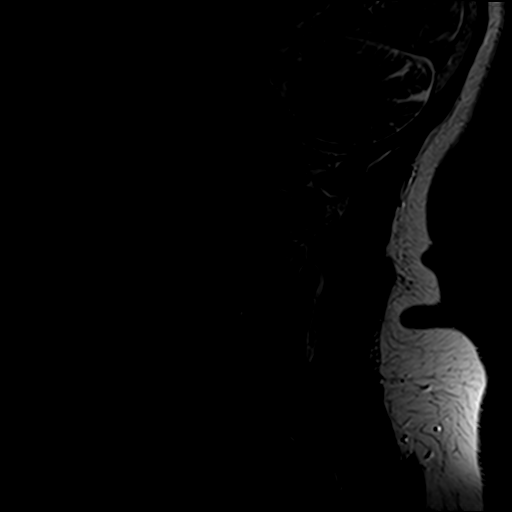

[Series 3: T1 · sagittal · 3.0mm · 0.41mm/px · 3 of 13 slices shown]
[im 3/13]
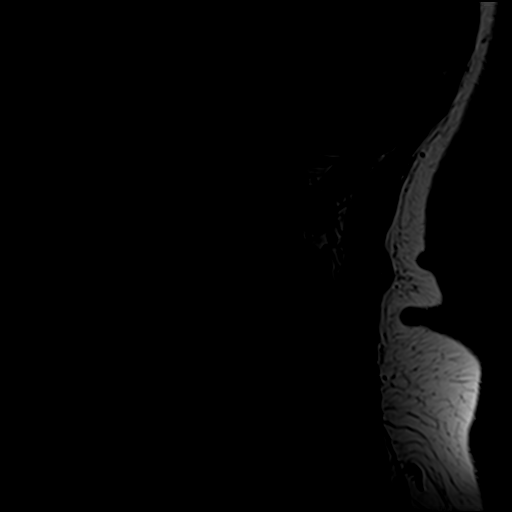
[im 7/13]
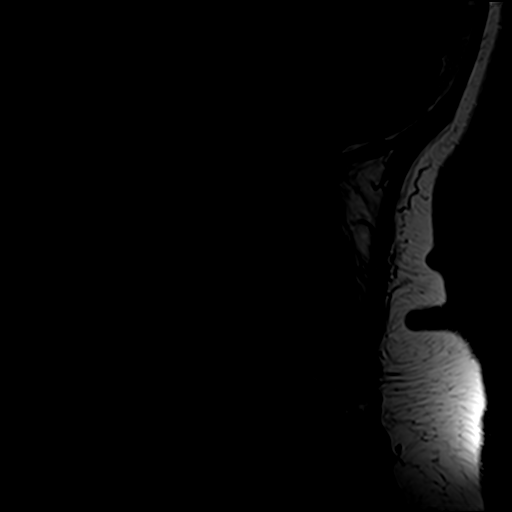
[im 11/13]
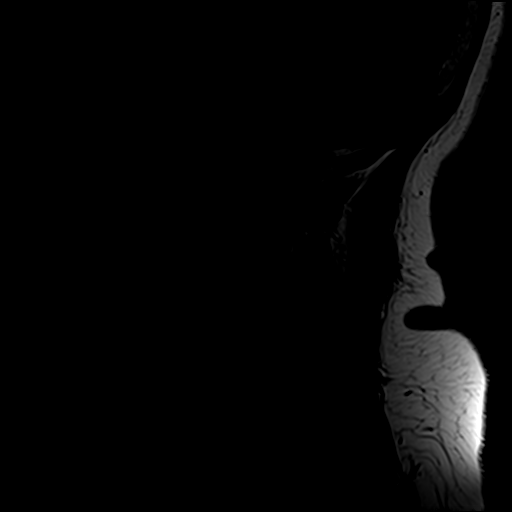

[Series 5: T2 · axial · 3.0mm · 0.39mm/px · z∈[-64,+29]mm · 8 of 26 slices shown (2 of 3)]
[im 1/26]
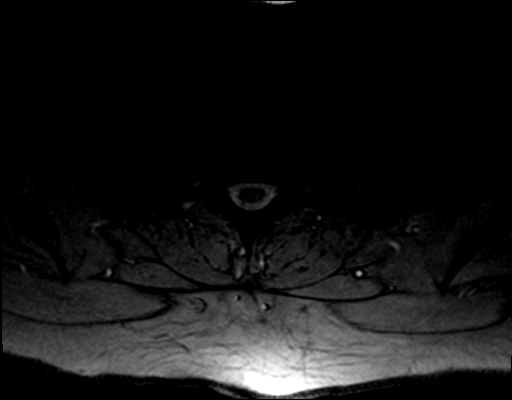
[im 4/26]
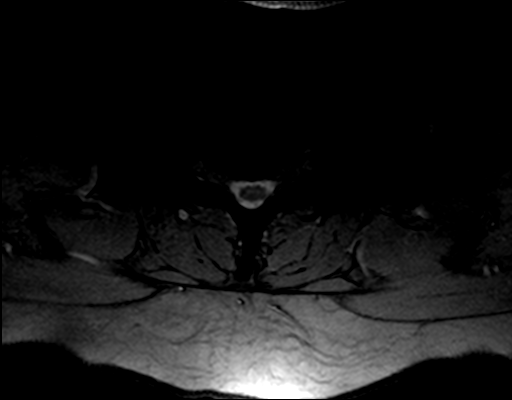
[im 8/26]
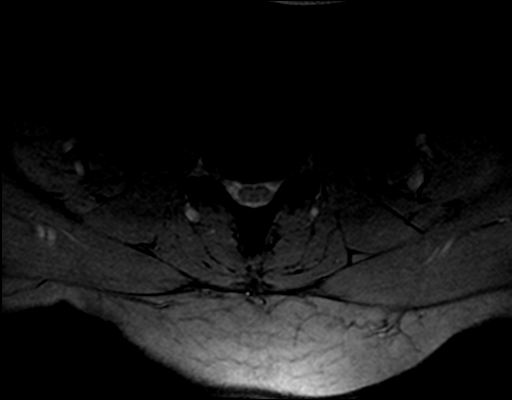
[im 12/26]
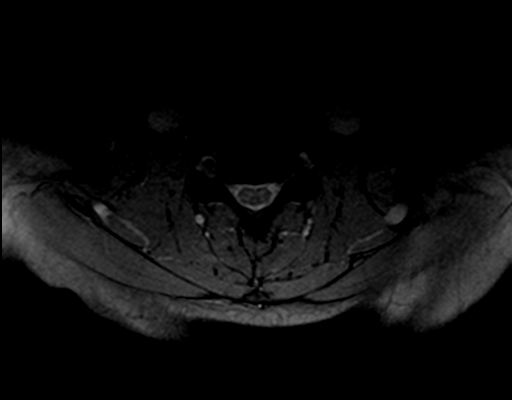
[im 14/26]
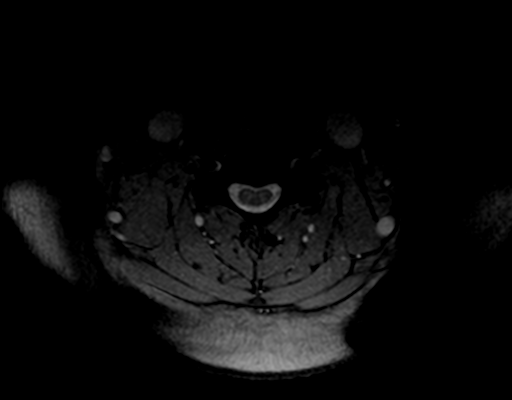
[im 18/26]
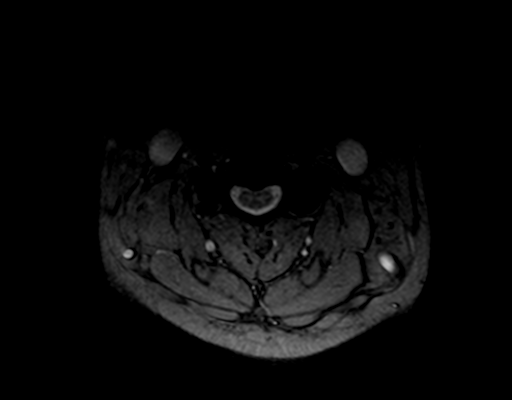
[im 22/26]
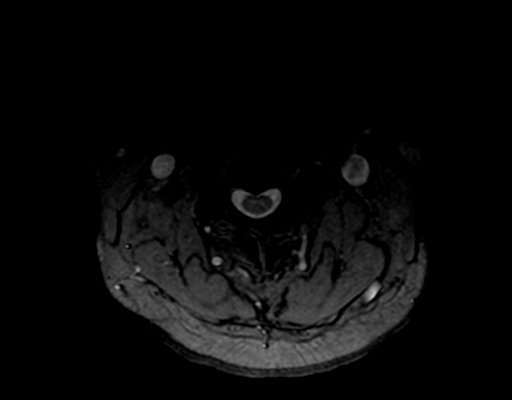
[im 26/26]
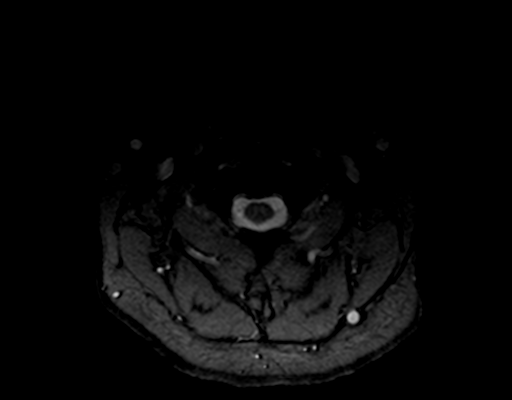

[Series 6: T2 · axial · 3.0mm · 0.39mm/px · z∈[-64,+14]mm · 5 of 26 slices shown (3 of 3)]
[im 1/26]
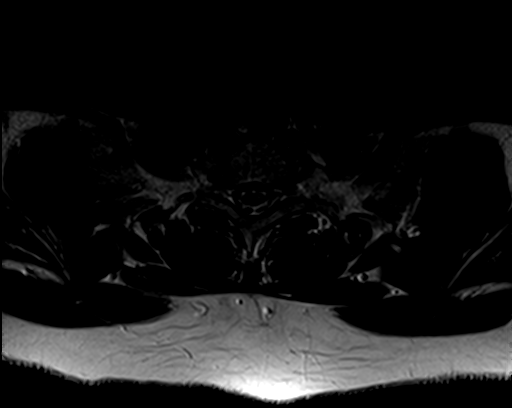
[im 4/26]
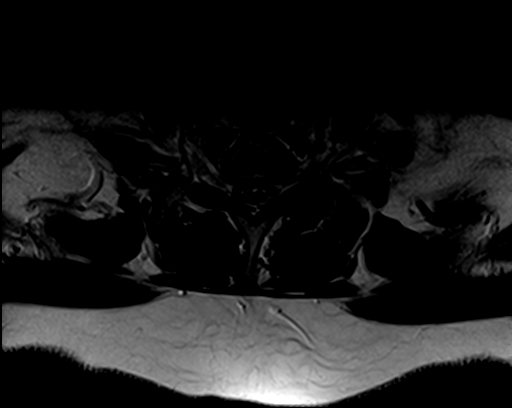
[im 8/26]
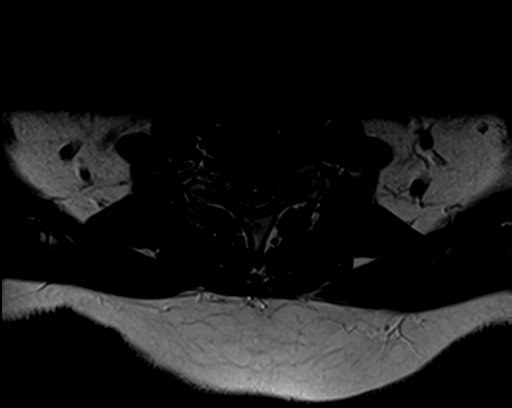
[im 14/26]
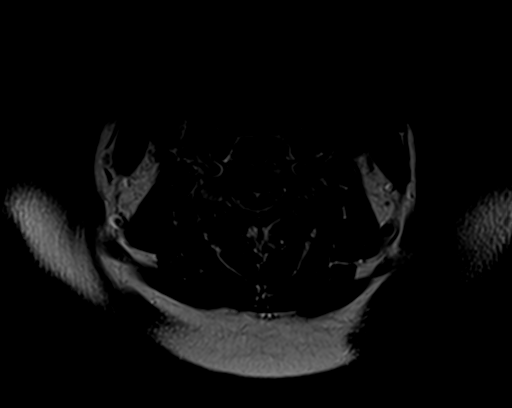
[im 22/26]
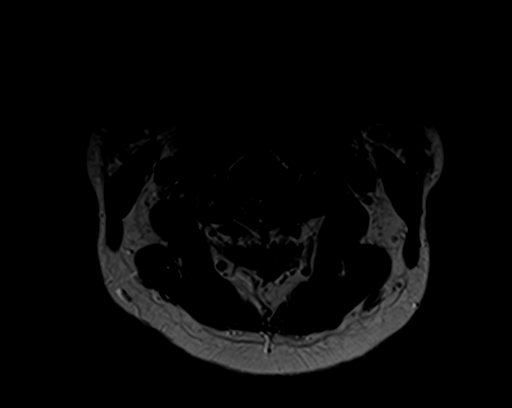

[22 of 48 positions shown; findings below may reference images not displayed]

FINDINGS: Alignment: Physiologic.

Vertebrae: No fracture, evidence of discitis, or bone lesion.

Cord: Normal signal and morphology.

Posterior Fossa, vertebral arteries, paraspinal tissues: Slight
increased signal intensity from the pons, nonspecific.

Disc levels:

C2-3:  Tiny central disc bulge without neural impingement.

C3-4: Small broad-based central disc bulge without neural
impingement. Moderate bilateral facet arthritis. No foraminal
stenosis.

C4-5: Small central disc bulge without neural impingement. Moderate
left and mild right facet arthritis. No foraminal stenosis.

C5-6: Minimal uncinate spurring to the right and left of midline
with slight narrowing of both neural foramina without focal neural
impingement.

C6-7: Small broad-based disc bulge without neural impingement.
Widely patent neural foramina.

C7-T1:  Moderately severe left facet arthritis.  Normal disc.
IMPRESSION: Multilevel degenerative disc and joint disease in the cervical spine
without focal neural impingement.

## 2018-04-20 ENCOUNTER — Encounter: Payer: Self-pay | Admitting: Internal Medicine

## 2018-04-20 ENCOUNTER — Ambulatory Visit (INDEPENDENT_AMBULATORY_CARE_PROVIDER_SITE_OTHER): Payer: Medicare Other | Admitting: Internal Medicine

## 2018-04-20 VITALS — BP 110/80 | HR 88 | Ht 65.0 in | Wt 189.0 lb

## 2018-04-20 DIAGNOSIS — R05 Cough: Secondary | ICD-10-CM

## 2018-04-20 DIAGNOSIS — I10 Essential (primary) hypertension: Secondary | ICD-10-CM

## 2018-04-20 DIAGNOSIS — J449 Chronic obstructive pulmonary disease, unspecified: Secondary | ICD-10-CM | POA: Diagnosis not present

## 2018-04-20 DIAGNOSIS — R059 Cough, unspecified: Secondary | ICD-10-CM

## 2018-04-20 NOTE — Patient Instructions (Addendum)
Symbicort 80 or 160 should be  Take 2 puffs first thing in am and then another 2 puffs about 12 hours later- you can pick which one you prefer but if  they are the same then use the lower strength   Please schedule a follow up visit in 3 months but call sooner if needed

## 2018-04-20 NOTE — Progress Notes (Signed)
Subjective:   Patient ID: [name], female    DOB: [birth year]    MRN: [id number]    Brief patient profile:  4  yobf mother of a PA in [place] quit smoking 2007 with chronic cough x 2011 self referred for evaluation of persistent cough in setting of technically GOLD II copd dx 02/2014 with only mild obstructive pattern - has noted since high school a tendency to tickle in throat causing intermittent cough waxing and waning ever since    History of Present Illness  10/19/2014 1st [facility] office visit/ [name]   Chief Complaint  Patient presents with  . Pulmonary Consult    Former pt of Dr. Annamaria Boots. Pt c/o increased cough x 3 wks. Cough is prod with moderate, thick, light yellow sputum.  She coughs until loses urinary continence.  Not sleeping well due to cough.   remembers having tendency to throat tickle in high school  but completely resolved by graduation  then recurred in her 57s and resolved with codeine then recurred in 2011  but bad to worse x 3 weeks assoc with sensation of choking, mucus is thick light yellow.  acei stopped about a month prior to OV  Replaced by losartan  Inhalers make it worse, esp dpi  Kouffman Reflux v Neurogenic Cough Differentiator Reflux Comments  Do you awaken from a sound sleep coughing violently?                            With trouble breathing? Yes   Do you have choking episodes when you cannot  Get enough air, gasping for air ?              Yes   Do you usually cough when you lie down into  The bed, or when you just lie down to rest ?                          Yes   Do you usually cough after meals or eating?         maybe   Do you cough when (or after) you bend over?    No    GERD SCORE     Kouffman Reflux v Neurogenic Cough Differentiator Neurogenic   Do you more-or-less cough all day long? yes   Does change of temperature make you cough? no   Does laughing or chuckling cause you to cough? no   Do fumes (perfume, automobile fumes, burned   Toast, etc.,) cause you to cough ?      Not aware   Does speaking, singing, or talking on the phone cause you to cough   ?               sometimes   Neurogenic/Airway score      rec  Max rx gerd/prn tramadol        09/15/2017  Transition of care  f/u ov/[name] re: COPD II s/p aecopd Chief Complaint  Patient presents with  . Hospitalization Follow-up    Breathing has improved some, but not back at her normal baseline. She states her cough woke her up this am and had some bloody nasal d/c.  She has been coughing up some thick, yellow sputum.  She is using   walking all over [place] fine,  Flew back to [place]  on a commercial jet and short of breath  walking from gate  to baggage claim then by 2 days later in ER due to severe sob / tightness across upper back >> albuterol relived the discomfort w/in 5 min but did not have her rescue so had to call 911 (proair listed on med calendar she just got from me 08/19/17 under written action plan) - says was so short of breath at that point could not have used saba anyway. Not following action plan either re controlling cough which is also worse/ has not started meds from discharge  rec Plan A = Automatic = symbicort 80 Take 2 puffs first thing in am and then another 2 puffs about 12 hours later.  Work on [employer] spatial [employer]: Plan B = Backup Only use your albuterol as a rescue medication       03/23/2018 acute extended  ov/[name] re:   Copd  - extended ov re new noct cough /wheeze and need for saba / no med calendar Chief Complaint  Patient presents with  . Acute Visit    Breathing is overall doing well. She uses her albuterol inhaler 4 x per wk on average. She has not needed her neb.  Mid February breathing /coughing  worse / wheezing every noct needs albuterol every noct Very confused with instructions/ now has neb but did not disclose med she uses at the med calendar ov and did not bring the med calendar with her nor the med for the  nebulizer and easily frustrated re concept of med reconciliation  Daytime sob = MMRC2 = can't walk a nl pace on a flat grade s sob but does fine slow and flat  rec Plan A = Automatic = symbicort 160 Take 2 puffs first thing in am and then another 2 puffs about 12 hours later.  Plan B = Backup Only use your albuterol as a rescue medication  Plan C = Crisis - only use your albuterol nebulizer if you first try Plan B and it fails to help > ok to use the nebulizer up to every 4 hours but if start needing it regularly call for immediate appointment     04/20/2018  f/u ov/[name] re:   Copd gold II / brought med calendar but not clear using consistently / chronic cough/ hbp Chief Complaint  Patient presents with  . Follow-up    COPD , feel good no problems at this time  Dyspnea:  Dances   2- 3 songs / shopping is ok  Cough: better, mostly dry/ throat clearing / noct  Tessalon maybe once every couple days  "it says to take it 3 x daily"  Sleep: better   while maint on symbicort 160 2bid   No obvious day to day or daytime variability or assoc excess/ purulent sputum or mucus plugs or hemoptysis or cp or chest tightness, subjective wheeze or overt sinus or hb symptoms. No unusual exposure hx or h/o childhood pna/ asthma or knowledge of premature birth.  Sleeping fine on 1 pillow   without nocturnal  or early am exacerbation  of respiratory  c/o's or need for noct saba. Also denies any obvious fluctuation of symptoms with weather or environmental changes or other aggravating or alleviating factors except as outlined above   Current Allergies, Complete Past Medical History, Past Surgical History, Family History, and Social History were reviewed in [employer] record.  ROS  The following are not active complaints unless bolded Hoarseness, sore throat, dysphagia, dental problems, itching, sneezing,  nasal congestion or discharge of  excess mucus or purulent secretions, ear ache,    fever, chills, sweats, unintended wt loss or wt gain, classically pleuritic or exertional cp,  orthopnea pnd or arm/hand swelling  or leg swelling, presyncope, palpitations, abdominal pain, anorexia, nausea, vomiting, diarrhea  or change in bowel habits or change in bladder habits, change in stools or change in urine, dysuria, hematuria,  rash, arthralgias, visual complaints, headache, numbness, weakness or ataxia or problems with walking or coordination,  change in mood or  memory.        Current Meds  Medication Sig  . albuterol (PROAIR HFA) 108 (90 Base) MCG/ACT inhaler Inhale 2 puffs into the lungs every 4 (four) hours as needed for wheezing or shortness of breath.  Marland Kitchen aspirin 81 MG tablet Take 81 mg by mouth daily.   . benzonatate (TESSALON) 200 MG capsule TAKE 1 CAPSULE (200 MG TOTAL) BY MOUTH 3 (THREE) TIMES DAILY AS NEEDED FOR COUGH.  . bisoprolol (ZEBETA) 5 MG tablet TAKE 1/2 TAB BY MOUTH DAILY (Patient taking differently: TAKE 2.5 mg BY MOUTH DAILY)  . budesonide-formoterol (SYMBICORT) 160-4.5 MCG/ACT inhaler Inhale 2 puffs into the lungs 2 (two) times daily.  Marland Kitchen CALCIUM-MAGNESIUM-ZINC PO Take 1 capsule by mouth daily.  . chlorpheniramine (CHLOR-TRIMETON) 4 MG tablet Take 4 mg by mouth 2 (two) times daily. Add 1 every 4 hours as needed for drainage, throat clearing  . Cholecalciferol (VITAMIN D3) 2000 UNITS TABS Take 2,000 Units by mouth daily.  . famotidine (PEPCID) 20 MG tablet TAKE 1 TABLET AT BEDTIME  . ipratropium-albuterol (DUONEB) 0.5-2.5 (3) MG/3ML SOLN Take 3 mLs by nebulization every 6 (six) hours as needed. DX: J44.9  . liraglutide (VICTOZA) 18 MG/3ML SOPN Inject 0.3 mLs (1.8 mg total) into the skin at bedtime.  Marland Kitchen LYRICA 75 MG capsule Take 75 mg by mouth 2 (two) times daily as needed (for pain).   . montelukast (SINGULAIR) 10 MG tablet TAKE 1 TABLET (10 MG TOTAL) BY MOUTH AT BEDTIME.  . pantoprazole (PROTONIX) 40 MG tablet TAKE 1 TABLET EVERY DAY 30-60 MINUTES BEFORE FIRST MEAL  OF THE DAY  . rosuvastatin (CRESTOR) 20 MG tablet Take 20 mg by mouth every evening.                       Objective:   Physical Exam  amb bf nad min hoarse/ rare throat clearing    04/07/2015 207  > 04/21/2015 208 > 06/01/2015  210>211 09/08/2015 > 01/30/2016   184 > 10/23/2016  190 > 04/14/2017   183 > 08/19/2017  185 >  03/23/2018  188 > 04/20/2018  189     Vital signs reviewed - Note on arrival 02 sats  96% on RA   And BP 110/80    HEENT: nl dentition, turbinates bilaterally, and oropharynx. Nl external ear canals without cough reflex   NECK :  without JVD/Nodes/TM/ nl carotid upstrokes bilaterally   LUNGS: no acc muscle use,  Nl contour chest which is clear to A and P bilaterally without cough on insp or exp maneuvers   CV:  RRR  no s3 or murmur or increase in P2, and no edema   ABD:  soft and nontender with nl inspiratory excursion in the supine position. No bruits or organomegaly appreciated, bowel sounds nl  MS:  Nl gait/ ext warm without deformities, calf tenderness, cyanosis or clubbing No obvious joint restrictions   SKIN: warm and dry without lesions    NEURO:  alert, approp,  nl sensorium with  no motor or cerebellar deficits apparent.

## 2018-04-22 ENCOUNTER — Encounter: Payer: Self-pay | Admitting: Internal Medicine

## 2018-04-22 NOTE — Assessment & Plan Note (Addendum)
-   allergy profile 03/10/14  >  IgE 18 with neg RAST - sinus ct 10/25/2014 > Clear sinuses. - try off cozar 11/03/2014 >>> - neurontin 100 tid rx 11/30/14 > improved 01/03/2015 > changed to lyrica 04/2015 but not taking consistently as of 01/30/2016  - sinus CT 02/17/15 > neg   Reviewed again the action plan for cough reading line by line to help her understand why this is so different from her scheduled/ maint rx  - specifically:  Ok to increase the tessalon 200 to as much as 200 tid but also down to zero if not coughing

## 2018-04-22 NOTE — Assessment & Plan Note (Signed)
Adequate control on present rx, reviewed in detail with pt > no change in rx needed   In the setting of respiratory symptoms of unknown etiology,  It would be preferable to  Continue  the most selective generic choice  on the market,  , to make sure the spillover Beta 2 effects of the less specific Beta blockers are not contributing to this patient's symptoms.

## 2018-04-22 NOTE — Assessment & Plan Note (Signed)
Quit smoking 2007 PFT: 03/17/2014-  FEV1  1.28 (58%) ratio 66 and no better p saba, ERV 27 and DLCO 56 corrects to 77 - 11/30/2014   try dulera 100 2bid > 01/03/2015 not clear helping,  use prn > flare since mid 02/2015 cough > sob  - 04/07/2015 p extensive coaching HFA effectiveness =    90% > try dulera 100 2bid > improved 04/21/15   - 06/01/2015   try dulera 200 2bid  > did not maintain  - Spirometry 04/14/2017  FEV1 1.13 (58%)  Ratio 52 p saba w/in 4 h > add symb 80 2bid   - 08/19/2017    resumed sym 80 2bid -  03/23/2018  After extensive coaching inhaler device  effectiveness =    90% > try symb 160 due to new noct cough/ wheeze > improved 04/20/2018   She is prone to uacs from high dose ICS but overall improved at this point to ok to try the 80 again with low threshold to increase to 160 if worse cough or wheeze or sob      Each maintenance medication was reviewed in detail including most importantly the difference between maintenance and as needed and under what circumstances the prns are to be used. This was done in the context of a medication calendar review which provided the patient with a user-friendly unambiguous mechanism for medication administration and reconciliation and provides an action plan for all active problems. It is critical that this be shown to every doctor  for modification during the office visit if necessary so the patient can use it as a working document.

## 2018-04-29 ENCOUNTER — Encounter: Payer: Self-pay | Admitting: Internal Medicine

## 2018-04-30 MED ORDER — BENZONATATE 200 MG PO CAPS: 200.0000 mg | ORAL_CAPSULE | Freq: Three times a day (TID) | ORAL | 0 refills | Status: DC | PRN

## 2018-06-18 ENCOUNTER — Encounter: Payer: Self-pay | Admitting: Internal Medicine

## 2018-06-19 ENCOUNTER — Telehealth: Payer: Self-pay | Admitting: Internal Medicine

## 2018-06-19 MED ORDER — GABAPENTIN 100 MG PO CAPS: 100.0000 mg | ORAL_CAPSULE | Freq: Three times a day (TID) | ORAL | 0 refills | Status: DC

## 2018-06-19 MED ORDER — OXYCODONE HCL 5 MG PO TABS: 5.0000 mg | ORAL_TABLET | ORAL | 0 refills | Status: DC | PRN

## 2018-06-19 NOTE — Telephone Encounter (Signed)
Just look and see exactly what we gave her previously (oxycodone 5 mg is fine if that's what it was)  and refill it x one until returns to regroup

## 2018-06-19 NOTE — Telephone Encounter (Signed)
Ok but strongly rec stop lyrica and take gabapentin 100 tid x 3 weeks then ov with all meds in hand to regroup and be sure this is resolved as can't continue to use hydrocodone for this purpose   Ok to refill hydrocodone once

## 2018-06-19 NOTE — Telephone Encounter (Signed)
MW please advise. Thanks   For the past 3 weeks, I have had a relentless tickle in my throat that keeps me coughing, gagging and experiencing drive heaves. I have religiously taken all the prescribed meds with no relief. The ONLY thing that stops this is Oxycodone HCL 5 mg which you prescribed in the past and I only request it when the cough and choking are very bad (the last request was August 2018). Thank you for your urgent attention to this matter. I am aware that I will need to pick the script up at your office. Please try to refill on Friday before the weekend. Thank you so very much.

## 2018-06-19 NOTE — Telephone Encounter (Signed)
Tanda Rockers, MD    11:44 AM  Note    Just look and see exactly what we gave her previously (oxycodone 5 mg is fine if that's what it was)  and refill it x one until returns to regroup       Copied above message from patient advise request.  Rx for Oxycodone 5mg  has been placed up front for pickup. Pt is aware and voiced her understanding.  Nothing further is needed.

## 2018-06-19 NOTE — Telephone Encounter (Signed)
I called and spoke with patient, advised her of the response from [facility]. When talking with the patient she stated that she was prescribed oxycodone pills not the hydrocodone.   MW please advise, there is also a hydrocodone pill as well as a hydrocodone syrup on patients med list.   I have sent in the gabapentin prescription.

## 2018-06-19 NOTE — Telephone Encounter (Signed)
Patient is calling for requesting a refill for Oxycodone.  Oxycodone not on current med list. Oxycodone 5mg , Q4hours, as needed for severe pain from 08/2017 meds.    Dr [name] please advise  Allergies  Allergen Reactions  . Other Itching    HAIR DYE   Current Outpatient Medications on File Prior to Visit  Medication Sig Dispense Refill  . albuterol (PROAIR HFA) 108 (90 Base) MCG/ACT inhaler Inhale 2 puffs into the lungs every 4 (four) hours as needed for wheezing or shortness of breath.    Marland Kitchen aspirin 81 MG tablet Take 81 mg by mouth daily.     . benzonatate (TESSALON) 200 MG capsule Take 1 capsule (200 mg total) by mouth 3 (three) times daily as needed for cough. 90 capsule 0  . bisoprolol (ZEBETA) 5 MG tablet TAKE 1/2 TAB BY MOUTH DAILY (Patient taking differently: TAKE 2.5 mg BY MOUTH DAILY) 15 tablet 0  . budesonide-formoterol (SYMBICORT) 160-4.5 MCG/ACT inhaler Inhale 2 puffs into the lungs 2 (two) times daily. 1 Inhaler 11  . CALCIUM-MAGNESIUM-ZINC PO Take 1 capsule by mouth daily.    . chlorpheniramine (CHLOR-TRIMETON) 4 MG tablet Take 4 mg by mouth 2 (two) times daily. Add 1 every 4 hours as needed for drainage, throat clearing    . Cholecalciferol (VITAMIN D3) 2000 UNITS TABS Take 2,000 Units by mouth daily.    . famotidine (PEPCID) 20 MG tablet TAKE 1 TABLET AT BEDTIME 90 tablet 3  . gabapentin (NEURONTIN) 100 MG capsule Take 1 capsule (100 mg total) by mouth 3 (three) times daily. Take one tablet three times daily for three weeks. 63 capsule 0  . ipratropium-albuterol (DUONEB) 0.5-2.5 (3) MG/3ML SOLN Take 3 mLs by nebulization every 6 (six) hours as needed. DX: J44.9 120 mL 2  . liraglutide (VICTOZA) 18 MG/3ML SOPN Inject 0.3 mLs (1.8 mg total) into the skin at bedtime.    Marland Kitchen LYRICA 75 MG capsule Take 75 mg by mouth 2 (two) times daily as needed (for pain).     . montelukast (SINGULAIR) 10 MG tablet TAKE 1 TABLET (10 MG TOTAL) BY MOUTH AT BEDTIME. 30 tablet 11  . pantoprazole (PROTONIX)  40 MG tablet TAKE 1 TABLET EVERY DAY 30-60 MINUTES BEFORE FIRST MEAL OF THE DAY 30 tablet 11  . rosuvastatin (CRESTOR) 20 MG tablet Take 20 mg by mouth every evening.      No current facility-administered medications on file prior to visit.

## 2018-07-20 ENCOUNTER — Ambulatory Visit: Payer: Medicare Other | Admitting: Internal Medicine

## 2018-07-22 ENCOUNTER — Ambulatory Visit (INDEPENDENT_AMBULATORY_CARE_PROVIDER_SITE_OTHER): Payer: Medicare Other | Admitting: Internal Medicine

## 2018-07-22 ENCOUNTER — Encounter: Payer: Self-pay | Admitting: Internal Medicine

## 2018-07-22 VITALS — BP 134/82 | HR 84 | Ht 65.0 in | Wt 195.0 lb

## 2018-07-22 DIAGNOSIS — J449 Chronic obstructive pulmonary disease, unspecified: Secondary | ICD-10-CM

## 2018-07-22 DIAGNOSIS — R05 Cough: Secondary | ICD-10-CM | POA: Diagnosis not present

## 2018-07-22 DIAGNOSIS — R059 Cough, unspecified: Secondary | ICD-10-CM

## 2018-07-22 MED ORDER — BUDESONIDE-FORMOTEROL FUMARATE 80-4.5 MCG/ACT IN AERO: 2.0000 | INHALATION_SPRAY | Freq: Two times a day (BID) | RESPIRATORY_TRACT | 0 refills | Status: DC

## 2018-07-22 MED ORDER — GABAPENTIN 300 MG PO CAPS: 300.0000 mg | ORAL_CAPSULE | Freq: Four times a day (QID) | ORAL | 2 refills | Status: DC

## 2018-07-22 MED ORDER — BUDESONIDE-FORMOTEROL FUMARATE 80-4.5 MCG/ACT IN AERO: 2.0000 | INHALATION_SPRAY | Freq: Two times a day (BID) | RESPIRATORY_TRACT | 12 refills | Status: DC

## 2018-07-22 MED ORDER — OXYCODONE HCL 5 MG PO TABS: 5.0000 mg | ORAL_TABLET | ORAL | 0 refills | Status: DC | PRN

## 2018-07-22 NOTE — Patient Instructions (Addendum)
Take delsym two tsp every 12 hours and supplement if needed with  Oxycodone  up to 1 every 4 hours to suppress the urge to cough. Swallowing water and/or using ice chips/non mint and menthol containing candies (such as lifesavers or sugarless jolly ranchers) are also effective.  You should rest your voice and avoid activities that you know make you cough.  Once you have eliminated the cough for 3 straight days try reducing the oxycodone first,  then the delsym as tolerated.    For drainage / throat tickle try take CHLORPHENIRAMINE  4 mg - take one every 4 hours as needed - available over the counter- may cause drowsiness - continue to take the bedtime dose as you've been     Reduce symbicort 80 Take 2 puffs first thing in am and then another 2 puffs about 12 hours later.    Gabapentin 300 mg four times a day - if not doing better we need to refer you to [facility] / Dr Joya Gaskins    See Tammy NP in 6  weeks with all your medications, even over the counter meds, separated in two separate bags, the ones you take no matter what vs the ones you stop once you feel better and take only as needed when you feel you need them.   Tammy  will generate for you a new user friendly medication calendar that will put [place] all on the same page re: your medication use.     Without this process, it simply isn't possible to assure that we are providing  your outpatient care  with  the attention to detail we feel you deserve.   If we cannot assure that you're getting that kind of care,  then we cannot manage your problem effectively from this clinic.  Once you have seen Tammy and we are sure that we're all on the same page with your medication use she will arrange follow up with me.

## 2018-07-22 NOTE — Progress Notes (Signed)
Subjective:   Patient ID: [name], female    DOB: August 01, 1946    MRN: [id number]    Brief patient profile:  10  yobf mother of a PA in [place] quit smoking 2007 with chronic cough x 2011 self referred for evaluation of persistent cough in setting of technically GOLD II copd dx 02/2014 with only mild obstructive pattern - has noted since high school a tendency to tickle in throat causing intermittent cough waxing and waning ever since    History of Present Illness  10/19/2014 1st [facility] office visit/ [name]   Chief Complaint  Patient presents with  . Pulmonary Consult    Former pt of [name]. Pt c/o increased cough x 3 wks. Cough is prod with moderate, thick, light yellow sputum.  She coughs until loses urinary continence.  Not sleeping well due to cough.   remembers having tendency to throat tickle in high school  but completely resolved by graduation  then recurred in her 53s and resolved with codeine then recurred in 2011  but bad to worse x 3 weeks assoc with sensation of choking, mucus is thick light yellow.  acei stopped about a month prior to OV  Replaced by losartan  Inhalers make it worse, esp dpi  Kouffman Reflux v Neurogenic Cough Differentiator Reflux Comments  Do you awaken from a sound sleep coughing violently?                            With trouble breathing? Yes   Do you have choking episodes when you cannot  Get enough air, gasping for air ?              Yes   Do you usually cough when you lie down into  The bed, or when you just lie down to rest ?                          Yes   Do you usually cough after meals or eating?         maybe   Do you cough when (or after) you bend over?    No    GERD SCORE     Kouffman Reflux v Neurogenic Cough Differentiator Neurogenic   Do you more-or-less cough all day long? yes   Does change of temperature make you cough? no   Does laughing or chuckling cause you to cough? no   Do fumes (perfume, automobile fumes, burned   Toast, etc.,) cause you to cough ?      Not aware   Does speaking, singing, or talking on the phone cause you to cough   ?               sometimes   Neurogenic/Airway score      rec  Max rx gerd/prn tramadol        09/15/2017  Transition of care  f/u ov/[name] re: COPD II s/p aecopd Chief Complaint  Patient presents with  . Hospitalization Follow-up    Breathing has improved some, but not back at her normal baseline. She states her cough woke her up this am and had some bloody nasal d/c.  She has been coughing up some thick, yellow sputum.  She is using   walking all over [place] fine,  Flew back to [place]  on a commercial jet and short of breath  walking from gate  to baggage claim then by 2 days later in ER due to severe sob / tightness across upper back >> albuterol relived the discomfort w/in 5 min but did not have her rescue so had to call 911 (proair listed on med calendar she just got from me 08/19/17 under written action plan) - says was so short of breath at that point could not have used saba anyway. Not following action plan either re controlling cough which is also worse/ has not started meds from discharge  rec Plan A = Automatic = symbicort 80 Take 2 puffs first thing in am and then another 2 puffs about 12 hours later.  Work on Interior and spatial [employer]: Plan B = Backup Only use your albuterol as a rescue medication       03/23/2018 acute extended  ov/[name] re:   Copd  - extended ov re new noct cough /wheeze and need for saba / no med calendar Chief Complaint  Patient presents with  . Acute Visit    Breathing is overall doing well. She uses her albuterol inhaler 4 x per wk on average. She has not needed her neb.  Mid February breathing /coughing  worse / wheezing every noct needs albuterol every noct Very confused with instructions/ now has neb but did not disclose med she uses at the med calendar ov and did not bring the med calendar with her nor the med for the  nebulizer and easily frustrated re concept of med reconciliation  Daytime sob = MMRC2 = can't walk a nl pace on a flat grade s sob but does fine slow and flat  rec Plan A = Automatic = symbicort 160 Take 2 puffs first thing in am and then another 2 puffs about 12 hours later.  Plan B = Backup Only use your albuterol as a rescue medication  Plan C = Crisis - only use your albuterol nebulizer if you first try Plan B and it fails to help > ok to use the nebulizer up to every 4 hours but if start needing it regularly call for immediate appointment     04/20/2018  f/u ov/[name] re:   Copd gold II / brought med calendar but not clear using consistently / chronic cough/ hbp Chief Complaint  Patient presents with  . Follow-up    COPD , feel good no problems at this time  Dyspnea:  Dances   2- 3 songs / shopping is ok  Cough: better, mostly dry/ throat clearing / noct  Tessalon maybe once every couple days  "it says to take it 3 x daily"  Sleep: better  while maint on symbicort 160 2bid Sleeping fine on 1 pillow     Symbicort 80 or 160 should be  Take 2 puffs first thing in am and then another 2 puffs about 12 hours later- you can pick which one you prefer but if  they are the same then use the lower strength Please schedule a follow up visit in 3 months but call sooner if needed     07/22/2018  f/u ov/[name] re:   GOLD II copd / has calendar not using the action plans at bottom/ since previous ov stopped lyrica and took gabapentin 100 tid x 3 weeks then stopped (was supposed to stay on until ov) Chief Complaint  Patient presents with  . Follow-up    Cough has improved slightly but "still driving me crazy"- prod with clear sputum.  She uses her proair 2 x per  wk and rarely uses neb.   Dyspnea:   MMRC2 = can't walk a nl pace on a flat grade s sob but does fine slow and flat  Cough  Tickle/ gag anytime day except doesn't wake her up goes back to childhood  SABA use: twice daily on bad days but  recently less / hardly ever uses neb   Says only thing that stops her cough is oxycodone.   No obvious day to day or daytime variability or assoc excess/ purulent sputum or mucus plugs or hemoptysis or cp or chest tightness, subjective wheeze or overt sinus or hb symptoms.   Sleeping: 2 pillows  without nocturnal  or early am exacerbation  of respiratory  c/o's or need for noct saba. Also denies any obvious fluctuation of symptoms with weather or environmental changes or other aggravating or alleviating factors except as outlined above   No unusual exposure hx or h/o childhood pna/ asthma or knowledge of premature birth.  Current Allergies, Complete Past Medical History, Past Surgical History, Family History, and Social History were reviewed in [employer] record.  ROS  The following are not active complaints unless bolded Hoarseness, sore throat, dysphagia, dental problems, itching, sneezing,  nasal congestion or discharge of excess mucus or purulent secretions, ear ache,   fever, chills, sweats, unintended wt loss or wt gain, classically pleuritic or exertional cp,  orthopnea pnd or arm/hand swelling  or leg swelling, presyncope, palpitations, abdominal pain, anorexia, nausea, vomiting, diarrhea  or change in bowel habits or change in bladder habits, change in stools or change in urine, dysuria, hematuria,  rash, arthralgias, visual complaints, headache, numbness, weakness or ataxia or problems with walking or coordination,  change in mood or  memory.        Current Meds  Medication Sig  . albuterol (PROAIR HFA) 108 (90 Base) MCG/ACT inhaler Inhale 2 puffs into the lungs every 4 (four) hours as needed for wheezing or shortness of breath.  Marland Kitchen aspirin 81 MG tablet Take 81 mg by mouth daily.   . benzonatate (TESSALON) 200 MG capsule Take 1 capsule (200 mg total) by mouth 3 (three) times daily as needed for cough.  . bisoprolol (ZEBETA) 5 MG tablet TAKE 1/2 TAB BY MOUTH  DAILY (Patient taking differently: TAKE 2.5 mg BY MOUTH DAILY)  . budesonide-formoterol (SYMBICORT) 160-4.5 MCG/ACT inhaler Inhale 2 puffs into the lungs 2 (two) times daily.  Marland Kitchen CALCIUM-MAGNESIUM-ZINC PO Take 1 capsule by mouth daily.  . chlorpheniramine (CHLOR-TRIMETON) 4 MG tablet Take 4 mg by mouth 2 (two) times daily. Add 1 every 4 hours as needed for drainage, throat clearing  . Cholecalciferol (VITAMIN D3) 2000 UNITS TABS Take 2,000 Units by mouth daily.  . famotidine (PEPCID) 20 MG tablet TAKE 1 TABLET AT BEDTIME  . gabapentin (NEURONTIN) 100 MG capsule Take 1 capsule (100 mg total) by mouth 3 (three) times daily. Take one tablet three times daily for three weeks.  Marland Kitchen ipratropium-albuterol (DUONEB) 0.5-2.5 (3) MG/3ML SOLN Take 3 mLs by nebulization every 6 (six) hours as needed. DX: J44.9  . liraglutide (VICTOZA) 18 MG/3ML SOPN Inject 0.3 mLs (1.8 mg total) into the skin at bedtime.  Marland Kitchen LYRICA 75 MG capsule Take 75 mg by mouth 2 (two) times daily as needed (for pain).   . montelukast (SINGULAIR) 10 MG tablet TAKE 1 TABLET (10 MG TOTAL) BY MOUTH AT BEDTIME.  Marland Kitchen oxyCODONE (ROXICODONE) 5 MG immediate release tablet Take 1 tablet (5 mg total) by mouth every  4 (four) hours as needed for severe pain.  . pantoprazole (PROTONIX) 40 MG tablet TAKE 1 TABLET EVERY DAY 30-60 MINUTES BEFORE FIRST MEAL OF THE DAY  . rosuvastatin (CRESTOR) 20 MG tablet Take 20 mg by mouth every evening.   .                                 Objective:   Physical Exam  amb bf nad min hoarse/ rare throat clearing  But then starts to gag / choke  04/07/2015 207  > 04/21/2015 208 > 06/01/2015  210>211 09/08/2015 > 01/30/2016   184 > 10/23/2016  190 > 04/14/2017   183 > 08/19/2017  185 >  03/23/2018  188 > 04/20/2018  189 > 07/22/2018  195    Vital signs reviewed - Note on arrival 02 sats  97% on RA        HEENT: nl   turbinates bilaterally, and oropharynx. Nl external ear canals without cough reflex - upper plate/ lower  partial   NECK :  without JVD/Nodes/TM/ nl carotid upstrokes bilaterally   LUNGS: no acc muscle use,  Nl contour chest with mid to late sonorous rhonchi  bilaterally without cough on insp or exp maneuvers   CV:  RRR  no s3 or murmur or increase in P2, and no edema   ABD:  soft and nontender with nl inspiratory excursion in the supine position. No bruits or organomegaly appreciated, bowel sounds nl  MS:  Nl gait/ ext warm without deformities, calf tenderness, cyanosis or clubbing No obvious joint restrictions   SKIN: warm and dry without lesions    NEURO:  alert, approp, nl sensorium with  no motor or cerebellar deficits apparent.

## 2018-07-23 ENCOUNTER — Encounter: Payer: Self-pay | Admitting: Internal Medicine

## 2018-07-23 NOTE — Assessment & Plan Note (Signed)
PFT: 03/17/2014-  FEV1  1.28 (58%) ratio 66 and no better p saba, ERV 27 and DLCO 56 corrects to 77 - 11/30/2014   try dulera 100 2bid > 01/03/2015 not clear helping,  use prn > flare since mid 02/2015 cough > sob  - 04/07/2015 p extensive coaching HFA effectiveness =    90% > try dulera 100 2bid > improved 04/21/15   - 06/01/2015   try dulera 200 2bid  > did not maintain  - Spirometry 04/14/2017  FEV1 1.13 (58%)  Ratio 52 p saba w/in 4 h > add symb 80 2bid   - 08/19/2017    resumed sym 80 2bid -  03/23/2018    try symb 160 due to new noct cough/ wheeze > improved 04/20/2018   - 07/22/2018  After extensive coaching inhaler device  effectiveness =    90% try symb 80 2bid due to UACS  The challenge is to treat her lower airways effectively s aggravating the upper.   If not doing as well with doe on the lower strength sym can use the 160 with spacer next

## 2018-07-23 NOTE — Assessment & Plan Note (Addendum)
-   allergy profile 03/10/14  >  IgE 18 with neg RAST - sinus ct 10/25/2014 > Clear sinuses. - try off cozar 11/03/2014 >>> - neurontin 100 tid rx 11/30/14 > improved 01/03/2015 > changed to lyrica 04/2015 but not taking consistently as of 01/30/2016  - sinus CT 02/17/15 > neg   - rechallenged with gabapentin 300 qid 07/22/2018 and if not better > refer to Dr Joya Gaskins at [facility] voice center    I had an extended discussion with the patient reviewing all relevant studies completed to date and  lasting 15 to 20 minutes of a 25 minute visit on the following ongoing concerns:   1)  Not consistently following plans long enough to evaluate response - needs to use the med calendar provided which I reviewed with her line by line today  2) 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 and best treated with gabapentin pushed to as high as 300 mg qid and if not tolerating or eliminating the problem > refer to Dr Joya Gaskins  3) In meantime for "drainage" can use prn 1st gen H1 blockers per guidelines    4) as last resort can use oxycodone 5 mg up to q4 h x 3 days to stop cyclical coughing but no refills s ov to be sure she's keeping her end of the bargain with the other meds - return for med reconciliation / new med calendar in 6 weeks     Each maintenance medication was reviewed in detail including most importantly the difference between maintenance and as needed and under what circumstances the prns are to be used. This was done in the context of a medication calendar review which provided the patient with a user-friendly unambiguous mechanism for medication administration and reconciliation and provides an action plan for all active problems. It is critical that this be shown to every doctor  for modification during the office visit if necessary so the patient can use it as a working document.      See device teaching which extended face to face time for this visit

## 2018-07-29 DIAGNOSIS — Z1389 Encounter for screening for other disorder: Secondary | ICD-10-CM | POA: Diagnosis not present

## 2018-07-29 DIAGNOSIS — I129 Hypertensive chronic kidney disease with stage 1 through stage 4 chronic kidney disease, or unspecified chronic kidney disease: Secondary | ICD-10-CM | POA: Diagnosis not present

## 2018-07-29 DIAGNOSIS — R7303 Prediabetes: Secondary | ICD-10-CM | POA: Diagnosis not present

## 2018-07-29 DIAGNOSIS — J449 Chronic obstructive pulmonary disease, unspecified: Secondary | ICD-10-CM | POA: Diagnosis not present

## 2018-07-29 DIAGNOSIS — Z79899 Other long term (current) drug therapy: Secondary | ICD-10-CM | POA: Diagnosis not present

## 2018-07-29 DIAGNOSIS — N182 Chronic kidney disease, stage 2 (mild): Secondary | ICD-10-CM | POA: Diagnosis not present

## 2018-07-29 DIAGNOSIS — N08 Glomerular disorders in diseases classified elsewhere: Secondary | ICD-10-CM | POA: Diagnosis not present

## 2018-07-29 DIAGNOSIS — R609 Edema, unspecified: Secondary | ICD-10-CM | POA: Diagnosis not present

## 2018-07-29 DIAGNOSIS — R0600 Dyspnea, unspecified: Secondary | ICD-10-CM | POA: Diagnosis not present

## 2018-07-29 DIAGNOSIS — E559 Vitamin D deficiency, unspecified: Secondary | ICD-10-CM | POA: Diagnosis not present

## 2018-07-29 DIAGNOSIS — E1122 Type 2 diabetes mellitus with diabetic chronic kidney disease: Secondary | ICD-10-CM | POA: Diagnosis not present

## 2018-07-29 LAB — CBC AND DIFFERENTIAL
HCT: 42 (ref 36–46)
WBC: 6

## 2018-07-29 LAB — BASIC METABOLIC PANEL
BUN: 5 (ref 4–21)
Creatinine: 0.8 (ref 0.5–1.1)
Glucose: 104
Potassium: 4.6 (ref 3.4–5.3)
Sodium: 143 (ref 137–147)

## 2018-07-29 LAB — LIPID PANEL
Cholesterol: 161 (ref 0–200)
HDL: 56 (ref 35–70)
LDL Cholesterol: 86
LDl/HDL Ratio: 1.5
Triglycerides: 97 (ref 40–160)

## 2018-07-29 LAB — VITAMIN D 25 HYDROXY (VIT D DEFICIENCY, FRACTURES): Vit D, 25-Hydroxy: 39.9

## 2018-07-29 LAB — HEMOGLOBIN A1C: Hgb A1c MFr Bld: 6.4 — AB (ref 4.0–6.0)

## 2018-07-29 LAB — HEPATIC FUNCTION PANEL: Alkaline Phosphatase: 62 (ref 25–125)

## 2018-07-29 LAB — TSH: TSH: 0.88 (ref 0.41–5.90)

## 2018-08-08 ENCOUNTER — Other Ambulatory Visit: Payer: Self-pay | Admitting: Nurse Practitioner

## 2018-08-08 DIAGNOSIS — R220 Localized swelling, mass and lump, head: Secondary | ICD-10-CM

## 2018-08-17 ENCOUNTER — Ambulatory Visit
Admission: RE | Admit: 2018-08-17 | Discharge: 2018-08-17 | Disposition: A | Discharge status: Home / Self Care | Payer: Medicare Other | Source: Ambulatory Visit | Attending: Nurse Practitioner | Admitting: Nurse Practitioner

## 2018-08-17 DIAGNOSIS — R220 Localized swelling, mass and lump, head: Secondary | ICD-10-CM | POA: Diagnosis not present

## 2018-08-28 DIAGNOSIS — R05 Cough: Secondary | ICD-10-CM | POA: Diagnosis not present

## 2018-08-28 DIAGNOSIS — R1032 Left lower quadrant pain: Secondary | ICD-10-CM | POA: Diagnosis not present

## 2018-08-28 DIAGNOSIS — E1122 Type 2 diabetes mellitus with diabetic chronic kidney disease: Secondary | ICD-10-CM | POA: Diagnosis not present

## 2018-08-28 DIAGNOSIS — R635 Abnormal weight gain: Secondary | ICD-10-CM | POA: Diagnosis not present

## 2018-09-07 ENCOUNTER — Encounter: Payer: Medicare Other | Admitting: Adult Health

## 2018-09-11 MED ORDER — OXYCODONE HCL 5 MG PO TABS: 5.0000 mg | ORAL_TABLET | ORAL | 0 refills | Status: DC | PRN

## 2018-09-11 NOTE — Telephone Encounter (Signed)
RX has been printed and placed on Dr. Gustavus Bryant desk for signature.

## 2018-09-11 NOTE — Telephone Encounter (Signed)
Ok to refill oxycodone x one  And ov will be needed for more but if returns for this would need to bring all other meds to be sure all other measures are being 100% employed   Oxycodone = 5 mg #30 take up to 1 q 4h prn cough

## 2018-09-11 NOTE — Telephone Encounter (Signed)
Dr. [name],  Please see email message from patient below and please advise. Thanks!     From: [name]  Sent: 9/12/20195:16 PM EDT  To: [name], MD Subject: Non-Urgent Medical Question  Gabapentin is totally not working. My cough has become increasingly embarrassing to the point where I have a lot of apprehension about being in public. So while I wait for [facility] to take over, May I please have a refill of Oxycodone which is the ONLY thing that suppresses my cough. The only thing. And since Dr. [name] said there was nothing else he can do for me, I hope he will refill this prescription one last time. Thank you.

## 2018-09-24 DIAGNOSIS — K5900 Constipation, unspecified: Secondary | ICD-10-CM | POA: Diagnosis not present

## 2018-09-24 DIAGNOSIS — K219 Gastro-esophageal reflux disease without esophagitis: Secondary | ICD-10-CM | POA: Diagnosis not present

## 2018-09-24 DIAGNOSIS — R1032 Left lower quadrant pain: Secondary | ICD-10-CM | POA: Diagnosis not present

## 2018-09-30 DIAGNOSIS — Z0189 Encounter for other specified special examinations: Secondary | ICD-10-CM | POA: Diagnosis not present

## 2018-09-30 DIAGNOSIS — D485 Neoplasm of uncertain behavior of skin: Secondary | ICD-10-CM | POA: Diagnosis not present

## 2018-10-04 ENCOUNTER — Encounter: Payer: Self-pay | Admitting: Internal Medicine

## 2018-10-05 ENCOUNTER — Other Ambulatory Visit: Payer: Self-pay

## 2018-10-05 MED ORDER — SEMAGLUTIDE(0.25 OR 0.5MG/DOS) 2 MG/1.5ML ~~LOC~~ SOPN: 0.5000 mg | PEN_INJECTOR | SUBCUTANEOUS | 2 refills | Status: DC

## 2018-10-06 ENCOUNTER — Encounter: Payer: Self-pay | Admitting: Internal Medicine

## 2018-10-06 ENCOUNTER — Other Ambulatory Visit: Payer: Self-pay

## 2018-10-06 ENCOUNTER — Ambulatory Visit (INDEPENDENT_AMBULATORY_CARE_PROVIDER_SITE_OTHER): Payer: Medicare Other | Admitting: Internal Medicine

## 2018-10-06 ENCOUNTER — Ambulatory Visit: Payer: Medicare Other | Admitting: Internal Medicine

## 2018-10-06 VITALS — BP 140/76 | HR 83 | Temp 98.5°F | Ht 65.0 in | Wt 197.0 lb

## 2018-10-06 DIAGNOSIS — R05 Cough: Secondary | ICD-10-CM

## 2018-10-06 DIAGNOSIS — E1122 Type 2 diabetes mellitus with diabetic chronic kidney disease: Secondary | ICD-10-CM

## 2018-10-06 DIAGNOSIS — N182 Chronic kidney disease, stage 2 (mild): Secondary | ICD-10-CM | POA: Diagnosis not present

## 2018-10-06 DIAGNOSIS — R053 Chronic cough: Secondary | ICD-10-CM

## 2018-10-06 MED ORDER — ACCU-CHEK FASTCLIX LANCETS MISC: 1.0000 | Freq: Every day | 11 refills | Status: DC

## 2018-10-06 MED ORDER — GLUCOSE BLOOD VI STRP: 1.0000 | ORAL_STRIP | Freq: Every day | 11 refills | Status: DC

## 2018-10-07 LAB — CMP14+EGFR
ALT: 11 IU/L (ref 0–32)
AST: 26 IU/L (ref 0–40)
Albumin/Globulin Ratio: 1.6 (ref 1.2–2.2)
Albumin: 4.4 g/dL (ref 3.5–4.8)
Alkaline Phosphatase: 68 IU/L (ref 39–117)
BUN/Creatinine Ratio: 8 — ABNORMAL LOW (ref 12–28)
BUN: 6 mg/dL — ABNORMAL LOW (ref 8–27)
Bilirubin Total: 0.3 mg/dL (ref 0.0–1.2)
CO2: 27 mmol/L (ref 20–29)
Calcium: 10.1 mg/dL (ref 8.7–10.3)
Chloride: 101 mmol/L (ref 96–106)
Creatinine, Ser: 0.79 mg/dL (ref 0.57–1.00)
GFR calc Af Amer: 87 mL/min/{1.73_m2} (ref >59)
GFR calc non Af Amer: 76 mL/min/{1.73_m2} (ref >59)
Globulin, Total: 2.8 g/dL (ref 1.5–4.5)
Glucose: 65 mg/dL (ref 65–99)
Potassium: 4.8 mmol/L (ref 3.5–5.2)
Sodium: 143 mmol/L (ref 134–144)
Total Protein: 7.2 g/dL (ref 6.0–8.5)

## 2018-10-07 LAB — HEMOGLOBIN A1C
Est. average glucose Bld gHb Est-mCnc: 140 mg/dL
Hgb A1c MFr Bld: 6.5 % — ABNORMAL HIGH (ref 4.8–5.6)

## 2018-10-07 NOTE — Progress Notes (Signed)
Here are your lab results:  Your liver and kidney function are stable. Your a1c is 6.5. Please continue with current meds.   Sincerely,    Rasheena Talmadge N. Baird Cancer, MD

## 2018-10-11 ENCOUNTER — Encounter: Payer: Self-pay | Admitting: Internal Medicine

## 2018-10-11 NOTE — Progress Notes (Signed)
Subjective:     Patient ID: [name] , female    DOB: 24-Oct-1946 , 72 y.o.   MRN: [id number]   Diabetes  She presents for her follow-up diabetic visit. She has type 2 diabetes mellitus. Her disease course has been stable. There are no hypoglycemic associated symptoms. There are no diabetic associated symptoms. Symptoms are stable.   SHE WAS STARTED ON OZEMPIC AT HER LAST VISIT. SHE TOLERATES MEDICATION W/O SIDE EFFECTS.   Past Medical History:  Diagnosis Date  . COPD (chronic obstructive pulmonary disease) ([place])   . Diabetes ([place])   . High cholesterol   . Hypertension   . Pneumonia 09/10/2017      Current Outpatient Medications:  .  albuterol (PROAIR HFA) 108 (90 Base) MCG/ACT inhaler, Inhale 2 puffs into the lungs every 4 (four) hours as needed for wheezing or shortness of breath., Disp: , Rfl:  .  aspirin 81 MG tablet, Take 81 mg by mouth daily. , Disp: , Rfl:  .  bisoprolol (ZEBETA) 5 MG tablet, TAKE 1/2 TAB BY MOUTH DAILY (Patient taking differently: TAKE 2.5 mg BY MOUTH DAILY), Disp: 15 tablet, Rfl: 0 .  budesonide-formoterol (SYMBICORT) 80-4.5 MCG/ACT inhaler, Inhale 2 puffs into the lungs 2 (two) times daily., Disp: 1 Inhaler, Rfl: 0 .  CALCIUM-MAGNESIUM-ZINC PO, Take 1 capsule by mouth daily., Disp: , Rfl:  .  Cholecalciferol (VITAMIN D3) 2000 UNITS TABS, Take 2,000 Units by mouth daily., Disp: , Rfl:  .  famotidine (PEPCID) 20 MG tablet, TAKE 1 TABLET AT BEDTIME, Disp: 90 tablet, Rfl: 3 .  gabapentin (NEURONTIN) 300 MG capsule, Take 1 capsule (300 mg total) by mouth 4 (four) times daily., Disp: 120 capsule, Rfl: 2 .  ipratropium-albuterol (DUONEB) 0.5-2.5 (3) MG/3ML SOLN, Take 3 mLs by nebulization every 6 (six) hours as needed. DX: J44.9, Disp: 120 mL, Rfl: 2 .  montelukast (SINGULAIR) 10 MG tablet, TAKE 1 TABLET (10 MG TOTAL) BY MOUTH AT BEDTIME., Disp: 30 tablet, Rfl: 11 .  rosuvastatin (CRESTOR) 20 MG tablet, Take 20 mg by mouth every evening. , Disp: , Rfl:  .   Semaglutide,0.25 or 0.5MG/DOS, (OZEMPIC, 0.25 OR 0.5 MG/DOSE,) 2 MG/1.5ML SOPN, Inject 0.5 mg into the skin once a week., Disp: 1 pen, Rfl: 2 .  ACCU-CHEK FASTCLIX LANCETS MISC, Inject 1 each as directed daily. Use as directed to check blood sugars daily dx: e11.22, Disp: 50 each, Rfl: 11 .  glucose blood (ACCU-CHEK GUIDE) test strip, 1 each by Other route daily. Use as instructed to check blood sugars 1 time per day dx: e11.22, Disp: 50 each, Rfl: 11   Allergies  Allergen Reactions  . Other Itching    HAIR DYE     Review of Systems  Constitutional: Negative.   Respiratory: Positive for cough (HAS CHRONIC COUGH. HAS BEEN FOLLOWED BY PULMONARY. UNFORTUNATELY NO RELIEF DESPITE USE OF LYRICA, GABAPENTIN - UNFORTUNATELY, SHE DID NOT TAKE AS DIRECTED. WILL REFER HER TO [facility] AS PER PULM SUGGESTION).   Cardiovascular: Negative.   Gastrointestinal: Negative.   Psychiatric/Behavioral: Negative.      Today's Vitals   10/06/18 1212  BP: 140/76  Pulse: 83  Temp: 98.5 F (36.9 C)  TempSrc: Oral  SpO2: 95%  Weight: 197 lb (89.4 kg)  Height: '5\' 5"'  (1.651 m)  PainSc: 0-No pain   Body mass index is 32.78 kg/m.   Objective:  Physical Exam  Constitutional: She is oriented to person, place, and time. She appears well-developed and well-nourished.  Neck: Normal range of motion.  Cardiovascular: Normal rate and regular rhythm.  Pulmonary/Chest: Effort normal and breath sounds normal.  Neurological: She is alert and oriented to person, place, and time.        Assessment And Plan:     1. Type 2 diabetes mellitus with stage 2 chronic kidney disease, without long-term current use of insulin ([place])  SHE IS ENCOURAGED TO CONTINUE WITH OZEMPIC. SHE IS ENCOURAGED TO INCORPORATE MORE EXERCISE INTO HER DAILY ROUTINE.   - CMP14+EGFR - Hemoglobin A1c  2. Chronic renal disease, stage II  CHRONIC. SHE IS ENCOURAGED TO STAY WELL HYDRATED.   3. Chronic cough  CHRONIC. MOST RECENT  PULM NOTE WAS REVIEWED IN FULL DETAIL DURING HER VISIT. I WILL REFER HER TO [facility] FOR FURTHER EVALUATION.  - Ambulatory referral to Pulmonology        [name], MD

## 2018-10-14 DIAGNOSIS — H43812 Vitreous degeneration, left eye: Secondary | ICD-10-CM | POA: Diagnosis not present

## 2018-10-14 DIAGNOSIS — H10413 Chronic giant papillary conjunctivitis, bilateral: Secondary | ICD-10-CM | POA: Diagnosis not present

## 2018-10-14 DIAGNOSIS — H2513 Age-related nuclear cataract, bilateral: Secondary | ICD-10-CM | POA: Diagnosis not present

## 2018-10-14 DIAGNOSIS — E119 Type 2 diabetes mellitus without complications: Secondary | ICD-10-CM | POA: Diagnosis not present

## 2018-10-14 DIAGNOSIS — H40013 Open angle with borderline findings, low risk, bilateral: Secondary | ICD-10-CM | POA: Diagnosis not present

## 2018-10-14 LAB — HM DIABETES EYE EXAM

## 2018-10-31 ENCOUNTER — Encounter: Payer: Self-pay | Admitting: Internal Medicine

## 2018-11-02 ENCOUNTER — Ambulatory Visit: Payer: Medicare Other | Admitting: Internal Medicine

## 2018-11-09 ENCOUNTER — Encounter: Payer: Self-pay | Admitting: Internal Medicine

## 2018-11-09 ENCOUNTER — Ambulatory Visit (INDEPENDENT_AMBULATORY_CARE_PROVIDER_SITE_OTHER): Payer: Medicare Other | Admitting: Internal Medicine

## 2018-11-09 ENCOUNTER — Ambulatory Visit (HOSPITAL_BASED_OUTPATIENT_CLINIC_OR_DEPARTMENT_OTHER): Admit: 2018-11-09 | Payer: Medicare Other | Admitting: Specialist

## 2018-11-09 ENCOUNTER — Encounter (HOSPITAL_BASED_OUTPATIENT_CLINIC_OR_DEPARTMENT_OTHER): Payer: Self-pay

## 2018-11-09 VITALS — BP 112/70 | HR 75 | Temp 98.0°F | Ht 65.0 in | Wt 194.2 lb

## 2018-11-09 DIAGNOSIS — R05 Cough: Secondary | ICD-10-CM

## 2018-11-09 DIAGNOSIS — E1165 Type 2 diabetes mellitus with hyperglycemia: Secondary | ICD-10-CM | POA: Insufficient documentation

## 2018-11-09 DIAGNOSIS — IMO0002 Reserved for concepts with insufficient information to code with codable children: Secondary | ICD-10-CM | POA: Insufficient documentation

## 2018-11-09 DIAGNOSIS — R053 Chronic cough: Secondary | ICD-10-CM

## 2018-11-09 SURGERY — EXCISION MASS
Anesthesia: General

## 2018-11-09 MED ORDER — OXYCODONE HCL 5 MG PO TABS: 5.0000 mg | ORAL_TABLET | ORAL | 0 refills | Status: DC | PRN

## 2018-11-10 NOTE — Progress Notes (Signed)
Subjective:     Patient ID: [name] , female    DOB: 25-Nov-1946 , 72 y.o.   MRN: [id number]   Chief Complaint  Patient presents with  . Cough    HPI  Cough  This is a chronic problem. The current episode started more than 1 year ago. The problem has been gradually worsening. The problem occurs every few hours. The cough is non-productive. Pertinent negatives include no chills, ear congestion, ear pain, fever, sore throat, shortness of breath, sweats or weight loss. She has tried OTC inhaler, OTC cough suppressant and rest for the symptoms. The treatment provided no relief.  She has been prescribed oxycodone in the past for pulmonary. She is awaiting consult with [facility] specialist at [facility] in Feb 2020.   Past Medical History:  Diagnosis Date  . COPD (chronic obstructive pulmonary disease) ([place])   . Diabetes ([place])   . High cholesterol   . Hypertension   . Pneumonia 09/10/2017     Family History  Problem Relation Age of Onset  . Heart murmur Mother   . Heart attack Father   . Diabetes Father   . Diabetes Brother      Current Outpatient Medications:  .  ACCU-CHEK FASTCLIX LANCETS MISC, Inject 1 each as directed daily. Use as directed to check blood sugars daily dx: e11.22, Disp: 50 each, Rfl: 11 .  albuterol (PROAIR HFA) 108 (90 Base) MCG/ACT inhaler, Inhale 2 puffs into the lungs every 4 (four) hours as needed for wheezing or shortness of breath., Disp: , Rfl:  .  aspirin 81 MG tablet, Take 81 mg by mouth daily. , Disp: , Rfl:  .  bisoprolol (ZEBETA) 5 MG tablet, TAKE 1/2 TAB BY MOUTH DAILY (Patient taking differently: TAKE 2.5 mg BY MOUTH DAILY), Disp: 15 tablet, Rfl: 0 .  budesonide-formoterol (SYMBICORT) 80-4.5 MCG/ACT inhaler, Inhale 2 puffs into the lungs 2 (two) times daily., Disp: 1 Inhaler, Rfl: 0 .  CALCIUM-MAGNESIUM-ZINC PO, Take 1 capsule by mouth daily., Disp: , Rfl:  .  Cholecalciferol (VITAMIN D3) 2000 UNITS TABS, Take 2,000 Units by mouth daily.,  Disp: , Rfl:  .  famotidine (PEPCID) 20 MG tablet, TAKE 1 TABLET AT BEDTIME, Disp: 90 tablet, Rfl: 3 .  gabapentin (NEURONTIN) 300 MG capsule, Take 1 capsule (300 mg total) by mouth 4 (four) times daily. (Patient taking differently: Take 300 mg by mouth 2 (two) times daily. ), Disp: 120 capsule, Rfl: 2 .  glucose blood (ACCU-CHEK GUIDE) test strip, 1 each by Other route daily. Use as instructed to check blood sugars 1 time per day dx: e11.22, Disp: 50 each, Rfl: 11 .  ipratropium-albuterol (DUONEB) 0.5-2.5 (3) MG/3ML SOLN, Take 3 mLs by nebulization every 6 (six) hours as needed. DX: J44.9, Disp: 120 mL, Rfl: 2 .  montelukast (SINGULAIR) 10 MG tablet, TAKE 1 TABLET (10 MG TOTAL) BY MOUTH AT BEDTIME., Disp: 30 tablet, Rfl: 11 .  pantoprazole (PROTONIX) 40 MG tablet, Take 40 mg by mouth daily., Disp: , Rfl:  .  rosuvastatin (CRESTOR) 20 MG tablet, Take 20 mg by mouth every evening. , Disp: , Rfl:  .  Semaglutide,0.25 or 0.5MG /DOS, (OZEMPIC, 0.25 OR 0.5 MG/DOSE,) 2 MG/1.5ML SOPN, Inject 0.5 mg into the skin once a week., Disp: 1 pen, Rfl: 2 .  oxyCODONE (ROXICODONE) 5 MG immediate release tablet, Take 1 tablet (5 mg total) by mouth every 4 (four) hours as needed (as needed for severe chronic cough). prn for severe chronic cough, Disp:  20 tablet, Rfl: 0   Allergies  Allergen Reactions  . Other Itching    HAIR DYE     Review of Systems  Constitutional: Negative.  Negative for chills, fever and weight loss.  HENT: Negative for ear pain and sore throat.   Respiratory: Positive for cough. Negative for shortness of breath.   Cardiovascular: Negative.   Gastrointestinal: Negative.   Genitourinary: Negative.   Neurological: Negative.   Psychiatric/Behavioral: Negative.      Today's Vitals   11/09/18 1508  BP: 112/70  Pulse: 75  Temp: 98 F (36.7 C)  TempSrc: Oral  SpO2: 95%  Weight: 194 lb 3.2 oz (88.1 kg)  Height: 5\' 5"  (1.651 m)  PainSc: 0-No pain   Body mass index is 32.32 kg/m.    Objective:  Physical Exam  Constitutional: She is oriented to person, place, and time. She appears well-developed and well-nourished.  HENT:  Head: Normocephalic and atraumatic.  Cardiovascular: Normal rate, regular rhythm and normal heart sounds.  Pulmonary/Chest: Effort normal and breath sounds normal.  Neurological: She is alert and oriented to person, place, and time.  Psychiatric: She has a normal mood and affect.  Nursing note and vitals reviewed.       Assessment And Plan:     1. Chronic cough  CSRS performed. She was given rx oxycodone to use prn. She will use as needed for incessant cough. We discussed her need to resume gabapentin. She is currently taking 300mg  twice daily. She does not think this is effective. I think she will benefit from one capsule in am and 2 capsules in the evening.   [name], MD

## 2018-11-17 ENCOUNTER — Ambulatory Visit: Payer: Self-pay | Admitting: Internal Medicine

## 2018-11-19 ENCOUNTER — Other Ambulatory Visit: Payer: Self-pay | Admitting: Internal Medicine

## 2018-11-19 MED ORDER — GABAPENTIN 300 MG PO CAPS: 300.0000 mg | ORAL_CAPSULE | Freq: Three times a day (TID) | ORAL | 2 refills | Status: DC

## 2018-12-03 ENCOUNTER — Other Ambulatory Visit: Payer: Self-pay | Admitting: Internal Medicine

## 2018-12-03 MED ORDER — PREDNISONE 10 MG PO TABS: ORAL_TABLET | ORAL | 0 refills | Status: DC

## 2018-12-03 NOTE — Telephone Encounter (Signed)
Can do Prednisone 10 mg take  4 each am x 2 days,   2 each am x 2 days,  1 each am x 2 days and stop but no further rx over the counter s ov with all meds in hand to regroup re better maintenance plan for her

## 2018-12-16 ENCOUNTER — Encounter: Payer: Self-pay | Admitting: Internal Medicine

## 2018-12-16 ENCOUNTER — Other Ambulatory Visit: Payer: Self-pay | Admitting: Internal Medicine

## 2018-12-16 MED ORDER — ALBUTEROL SULFATE HFA 108 (90 BASE) MCG/ACT IN AERS: 2.0000 | INHALATION_SPRAY | RESPIRATORY_TRACT | 11 refills | Status: DC | PRN

## 2018-12-17 ENCOUNTER — Encounter: Payer: Self-pay | Admitting: Internal Medicine

## 2018-12-25 ENCOUNTER — Other Ambulatory Visit: Payer: Self-pay | Admitting: Internal Medicine

## 2018-12-28 ENCOUNTER — Other Ambulatory Visit: Payer: Self-pay | Admitting: Internal Medicine

## 2019-01-07 ENCOUNTER — Encounter: Payer: Self-pay | Admitting: Internal Medicine

## 2019-01-07 ENCOUNTER — Ambulatory Visit (INDEPENDENT_AMBULATORY_CARE_PROVIDER_SITE_OTHER): Payer: Medicare Other | Admitting: Internal Medicine

## 2019-01-07 DIAGNOSIS — R059 Cough, unspecified: Secondary | ICD-10-CM

## 2019-01-07 DIAGNOSIS — R05 Cough: Secondary | ICD-10-CM | POA: Diagnosis not present

## 2019-01-07 DIAGNOSIS — J4541 Moderate persistent asthma with (acute) exacerbation: Secondary | ICD-10-CM

## 2019-01-07 MED ORDER — OXYCODONE HCL 5 MG PO TABS: 5.0000 mg | ORAL_TABLET | ORAL | 0 refills | Status: DC | PRN

## 2019-01-07 MED ORDER — PREDNISONE 10 MG PO TABS: ORAL_TABLET | ORAL | 0 refills | Status: DC

## 2019-01-07 NOTE — Progress Notes (Signed)
Subjective:   Patient ID: [name], female    DOB: 1946-04-11    MRN: [id number]    Brief patient profile:  16  yobf mother of a PA in [employer] quit smoking 2007 with chronic cough x 2011 self referred for evaluation of persistent cough in setting of technically GOLD II copd dx 02/2014 with only mild obstructive pattern - has noted since high school a tendency to daily  tickle in throat causing intermittent cough waxing and waning ever since    History of Present Illness  10/19/2014 1st [facility] office visit/ [name]   Chief Complaint  Patient presents with  . Pulmonary Consult    Former pt of Dr. Annamaria Boots. Pt c/o increased cough x 3 wks. Cough is prod with moderate, thick, light yellow sputum.  She coughs until loses urinary continence.  Not sleeping well due to cough.   remembers having tendency to throat tickle in high school  but completely resolved by graduation  then recurred in her 52s and resolved with codeine then recurred in 2011  but bad to worse x 3 weeks assoc with sensation of choking, mucus is thick light yellow.  acei stopped about a month prior to OV  Replaced by losartan  Inhalers make it worse, esp dpi  Kouffman Reflux v Neurogenic Cough Differentiator Reflux Comments  Do you awaken from a sound sleep coughing violently?                            With trouble breathing? Yes   Do you have choking episodes when you cannot  Get enough air, gasping for air ?              Yes   Do you usually cough when you lie down into  The bed, or when you just lie down to rest ?                          Yes   Do you usually cough after meals or eating?         maybe   Do you cough when (or after) you bend over?    No    GERD SCORE     Kouffman Reflux v Neurogenic Cough Differentiator Neurogenic   Do you more-or-less cough all day long? yes   Does change of temperature make you cough? no   Does laughing or chuckling cause you to cough? no   Do fumes (perfume, automobile fumes,  burned  Toast, etc.,) cause you to cough ?      Not aware   Does speaking, singing, or talking on the phone cause you to cough   ?               sometimes   Neurogenic/Airway score      rec  Max rx gerd/prn tramadol        09/15/2017  Transition of care  f/u ov/[name] re: COPD II s/p aecopd Chief Complaint  Patient presents with  . Hospitalization Follow-up    Breathing has improved some, but not back at her normal baseline. She states her cough woke her up this am and had some bloody nasal d/c.  She has been coughing up some thick, yellow sputum.  She is using   walking all over [place] fine,  Flew back to [place]  on a commercial jet and short of breath  walking  from gate [place] baggage [place] then by 2 days later in ER due to severe sob / tightness across upper back >> albuterol relived the discomfort w/in 5 min but did not have her rescue so had to call 911 (proair listed on med calendar she just got from me 08/19/17 under written action plan) - says was so short of breath at that point could not have used saba anyway. Not following action plan either re controlling cough which is also worse/ has not started meds from discharge  rec Plan A = Automatic = symbicort 80 Take 2 puffs first thing in am and then another 2 puffs about 12 hours later.  Work on Interior and spatial [employer]: Plan B = Backup Only use your albuterol as a rescue medication       03/23/2018 acute extended  ov/[name] re:   Copd  - extended ov re new noct cough /wheeze and need for saba / no med calendar Chief Complaint  Patient presents with  . Acute Visit    Breathing is overall doing well. She uses her albuterol inhaler 4 x per wk on average. She has not needed her neb.  Mid February breathing /coughing  worse / wheezing every noct needs albuterol every noct Very confused with instructions/ now has neb but did not disclose med she uses at the med calendar ov and did not bring the med calendar with her nor the med for the  nebulizer and easily frustrated re concept of med reconciliation  Daytime sob = MMRC2 = can't walk a nl pace on a flat grade s sob but does fine slow and flat  rec Plan A = Automatic = symbicort 160 Take 2 puffs first thing in am and then another 2 puffs about 12 hours later.  Plan B = Backup Only use your albuterol as a rescue medication  Plan C = Crisis - only use your albuterol nebulizer if you first try Plan B and it fails to help > ok to use the nebulizer up to every 4 hours but if start needing it regularly call for immediate appointment     04/20/2018  f/u ov/[name] re:   Copd gold II / brought med calendar but not clear using consistently / chronic cough/ hbp Chief Complaint  Patient presents with  . Follow-up    COPD , feel good no problems at this time  Dyspnea:  Dances   2- 3 songs / shopping is ok  Cough: better, mostly dry/ throat clearing / noct  Tessalon maybe once every couple days  "it says to take it 3 x daily"  Sleep: better  while maint on symbicort 160 2bid Sleeping fine on 1 pillow     Symbicort 80 or 160 should be  Take 2 puffs first thing in am and then another 2 puffs about 12 hours later- you can pick which one you prefer but if  they are the same then use the lower strength Please schedule a follow up visit in 3 months but call sooner if needed     07/22/2018  f/u ov/[name] re:   GOLD II copd / has calendar not using the action plans at bottom/ since previous ov stopped lyrica and took gabapentin 100 tid x 3 weeks then stopped (was supposed to stay on until ov) Chief Complaint  Patient presents with  . Follow-up    Cough has improved slightly but "still driving me crazy"- prod with clear sputum.  She uses her proair 2  x per wk and rarely uses neb.   Dyspnea:   MMRC2 = can't walk a nl pace on a flat grade s sob but does fine slow and flat  Cough  Tickle/ gag anytime day except doesn't wake her up goes back to childhood SABA use: twice daily on bad days but  recently less / hardly ever uses neb  Says only thing that stops her cough is oxycodone rec Take delsym two tsp every 12 hours and supplement if needed with  Oxycodone  up to 1 every 4 hours to suppress the urge to cough.  Once you have eliminated the cough for 3 straight days try reducing the oxycodone first,  then the delsym as tolerated.   For drainage / throat tickle try take CHLORPHENIRAMINE  4 mg - take one every 4 hours as needed   Reduce symbicort 80 Take 2 puffs first thing in am and then another 2 puffs about 12 hours later. > did not continue consistently "you didn't make this clear"  Gabapentin 300 mg four times a day - if not doing better we need to refer you to [facility] / Dr Joya Gaskins > did not do  See Tammy NP in 6  weeks with all your medications>  Did not do    01/07/2019  Acute extended  ov/[name] re:  Flare of wheeze off symbicort, cough never resolved / no med calendar/ poor insight into meds/ action plans  Chief Complaint  Patient presents with  . Acute Visit    pt c/o wheezing, prod cough with gray/white mucus, pnd, stuffy nose.   Dyspnea:  MMRC3 = can't walk 100 yards even at a slow pace at a flat grade s stopping due to sob   Cough: severe fits of coughing day and noc  Sleeping: poorly due to cough and wheeze SABA use: qid instead of symbicort  02: none    No obvious day to day or daytime variability or assoc   purulent sputum or mucus plugs or hemoptysis or cp or  overt   hb symptoms.     Also denies any obvious fluctuation of symptoms with weather or environmental changes or other aggravating or alleviating factors except as outlined above   No unusual exposure hx or h/o childhood pna/ asthma or knowledge of premature birth.  Current Allergies, Complete Past Medical History, Past Surgical History, Family History, and Social History were reviewed in [employer] record.  ROS  The following are not active complaints unless bolded Hoarseness,  sore throat, dysphagia, dental problems, itching, sneezing,  nasal congestion or discharge of excess mucus or purulent secretions, ear ache,   fever, chills, sweats, unintended wt loss or wt gain, classically pleuritic or exertional cp,  orthopnea pnd or arm/hand swelling  or leg swelling, presyncope, palpitations, abdominal pain, anorexia, nausea, vomiting, diarrhea  or change in bowel habits or change in bladder habits, change in stools or change in urine, dysuria, hematuria,  rash, arthralgias, visual complaints, headache, numbness, weakness or ataxia or problems with walking or coordination,  change in mood or  memory.        Current Meds  Did not bring all meds/ can't confirm this list is accurate   Medication Sig  . ACCU-CHEK FASTCLIX LANCETS MISC Inject 1 each as directed daily. Use as directed to check blood sugars daily dx: e11.22  . albuterol (PROAIR HFA) 108 (90 Base) MCG/ACT inhaler Inhale 2 puffs into the lungs every 4 (four) hours as needed for  wheezing or shortness of breath.  Marland Kitchen aspirin 81 MG tablet Take 81 mg by mouth daily.   . bisoprolol (ZEBETA) 5 MG tablet TAKE 1/2 A TABLET BY MOUTH EVERY DAY  . budesonide-formoterol (SYMBICORT) 80-4.5 MCG/ACT inhaler Inhale 2 puffs into the lungs 2 (two) times daily.  Marland Kitchen CALCIUM-MAGNESIUM-ZINC PO Take 1 capsule by mouth daily.  . Cholecalciferol (VITAMIN D3) 2000 UNITS TABS Take 2,000 Units by mouth daily.  . famotidine (PEPCID) 20 MG tablet TAKE 1 TABLET AT BEDTIME  . gabapentin (NEURONTIN) 300 MG capsule Take 1 capsule (300 mg total) by mouth 3 (three) times daily.  Marland Kitchen glucose blood (ACCU-CHEK GUIDE) test strip 1 each by Other route daily. Use as instructed to check blood sugars 1 time per day dx: e11.22  . ipratropium-albuterol (DUONEB) 0.5-2.5 (3) MG/3ML SOLN Take 3 mLs by nebulization every 6 (six) hours as needed. DX: J44.9  . montelukast (SINGULAIR) 10 MG tablet TAKE 1 TABLET (10 MG TOTAL) BY MOUTH AT BEDTIME.  Marland Kitchen oxyCODONE (ROXICODONE) 5 MG  immediate release tablet Take 1 tablet (5 mg total) by mouth every 4 (four) hours as needed (as needed for severe chronic cough). prn for severe chronic cough  . OZEMPIC, 0.25 OR 0.5 MG/DOSE, 2 MG/1.5ML SOPN INJECT 0.5 MG UNDER THE SKIN ONCE A WEEK  . pantoprazole (PROTONIX) 40 MG tablet Take 40 mg by mouth daily.  . rosuvastatin (CRESTOR) 20 MG tablet Take 20 mg by mouth every evening.                                    Objective:   Physical Exam   Amb bf nad congested/ ratttling cough   04/07/2015 207  > 04/21/2015 208 > 06/01/2015  210>211 09/08/2015 > 01/30/2016   184 > 10/23/2016  190 > 04/14/2017   183 > 08/19/2017  185 >  03/23/2018  188 > 04/20/2018  189 > 07/22/2018  195 > 01/07/2019  193   Vital signs reviewed - Note on arrival 02 sats  95% on RA          HEENT: Upper plate/ lower partial, nl  turbinates bilaterally, and oropharynx. Nl external ear canals without cough reflex   NECK :  without JVD/Nodes/TM/ nl carotid upstrokes bilaterally   LUNGS: no acc muscle use,  Nl contour chest with insp and exp rhonchi bilaterally with lots of upper airway transmitted noise   CV:  RRR  no s3 or murmur or increase in P2, and no edema   ABD:  soft and nontender with nl inspiratory excursion in the supine position. No bruits or organomegaly appreciated, bowel sounds nl  MS:  Nl gait/ ext warm without deformities, calf tenderness, cyanosis or clubbing No obvious joint restrictions   SKIN: warm and dry without lesions    NEURO:  alert, approp, nl sensorium with  no motor or cerebellar deficits apparent.

## 2019-01-07 NOTE — Patient Instructions (Addendum)
Plan A = Automatic = Symbicort 80 Take 2 puffs first thing in am and then another 2 puffs about 12 hours later.     Plan B = Backup (add this to Plan A, not in place of it)  Only use your albuterol inhaler as a rescue medication to be used if you can't catch your breath by resting or doing a relaxed purse lip breathing pattern.  - The less you use it, the better it will work when you need it. - Ok to use the inhaler up to 2 puffs  every 4 hours if you must but call for appointment if use goes up over your usual need - Don't leave home without it !!  (think of it like the spare tire for your car)   Plan C = Crisis - only use your albuterol nebulizer if you first try Plan B and it fails to help > ok to use the nebulizer up to every 4 hours but if start needing it regularly call for immediate appointment   For drainage / throat tickle try take CHLORPHENIRAMINE  4 mg(Chlortab at [facility]) - take one every 4 hours as needed - available over the counter- may cause drowsiness so start with just a bedtime dose or two and see how you tolerate it before trying in daytime    Take delsym two tsp every 12 hours and supplement if needed with  vicodine up to 2 every 4 hours to suppress the urge to cough. Swallowing water and/or using ice chips/non mint and menthol containing candies (such as lifesavers or sugarless jolly ranchers) are also effective.  You should rest your voice and avoid activities that you know make you cough.  Once you have eliminated the cough for 3 straight days try reducing the vicodin first,  then the delsym as tolerated.    Prednisone 10 mg take  4 each am x 2 days,   2 each am x 2 days,  1 each am x 2 days and stop    If not improving > See Tammy NP s with all your medications, even over the counter meds, separated in two separate bags, the ones you take no matter what vs the ones you stop once you feel better and take only as needed when you feel you need them.   Tammy  will generate  for you a new user friendly medication calendar that will put [place] all on the same page re: your medication use.     Without this process, it simply isn't possible to assure that we are providing  your outpatient care  with  the attention to detail we feel you deserve.   If we cannot assure that you're getting that kind of care,  then we cannot manage your problem effectively from this clinic.  Once you have seen Tammy and we are sure that we're all on the same page with your medication use she will arrange follow up with me.

## 2019-01-08 ENCOUNTER — Encounter: Payer: Self-pay | Admitting: Internal Medicine

## 2019-01-08 NOTE — Assessment & Plan Note (Signed)
Quit smoking 2007 PFT: 03/17/2014-  FEV1  1.28 (58%) ratio 66 and no better p saba, ERV 27 and DLCO 56 corrects to 77 - 11/30/2014   try dulera 100 2bid > 01/03/2015 not clear helping,  use prn > flare since mid 02/2015 cough > sob  - 04/07/2015 p extensive coaching HFA effectiveness =    90% > try dulera 100 2bid > improved 04/21/15   - 06/01/2015   try dulera 200 2bid  > did not maintain  - Spirometry 04/14/2017  FEV1 1.13 (58%)  Ratio 52 p saba w/in 4 h > add symb 80 2bid   - 08/19/2017    resumed sym 80 2bid -  03/23/2018    try symb 160 due to new noct cough/ wheeze > improved 04/20/2018  - 07/22/2018   try symb 80 2bid due to UACS  -01/07/2019  After extensive coaching inhaler device,  effectiveness =    90% continue symb 80 2bid    DDX of  difficult airways management almost all start with A and  include Adherence, Ace Inhibitors, Acid Reflux, Active Sinus Disease, Alpha 1 Antitripsin deficiency, Anxiety masquerading as Airways dz,  ABPA,  Allergy(esp in young), Aspiration (esp in elderly), Adverse effects of meds,  Active smoking or vaping, A bunch of PE's (a small clot burden can't cause this syndrome unless there is already severe underlying pulm or vascular dz with poor reserve) plus two Bs  = Bronchiectasis and Beta blocker use..and one C= CHF   Adherence is always the initial "prime suspect" and is a multilayered concern that requires a "trust but verify" approach in every patient - starting with knowing how to use medications, especially inhalers, correctly, keeping up with refills and understanding the fundamental difference between maintenance and prns vs those medications only taken for a very short course and then stopped and not refilled.  - see hfa teaching - multiple misunderstanding re maint (automatic) vs prns despite very precise, user-friendly med calendar generation which she lost and was shown a copy of today "oh yeah, that.... well you didn't make it clear that was important"  -  re-printed the old calendar today and made approp edits  - needs formal med rec:   To keep things simple, I have asked the patient to first separate medicines that are perceived as maintenance, that is to be taken daily "no matter what", from those medicines that are taken on only on an as-needed basis and I have given the patient examples of both, and then return to see our NP to generate a  detailed  medication calendar which should be followed until the next physician sees the patient and updates it.     ? Acid (or non-acid) GERD > always difficult to exclude as up to 75% of pts in some series report no assoc GI/ Heartburn symptoms> rec continue max (24h)  acid suppression and diet restrictions/ reviewed   - Of the three most common causes of  Sub-acute / recurrent or chronic cough, only one (GERD)  can actually contribute to/ trigger  the other two (asthma and post nasal drip syndrome)  and perpetuate the cylce of cough.  While not intuitively obvious, many patients with chronic low grade reflux do not cough until there is a primary insult that disturbs the protective epithelial barrier and exposes sensitive nerve endings.   This is typically viral but can due to PNDS and  either may apply here.     >>>  The point  is that once this occurs, it is difficult to eliminate the cycle  using anything but a maximally effective acid suppression regimen at least in the short run, accompanied by an appropriate diet to address non acid GERD and control / eliminate the cough itself for at least 3 days with vicodin but not refills beyond what we gave her today    ? Allergy >  Prednisone 10 mg take  4 each am x 2 days,   2 each am x 2 days,  1 each am x 2 days and stop / continue singulair  ? Anxiety /depression > usually at the bottom of this list of usual suspects but should be much higher on this pt's based on H and P and   may interfere with adherence and also interpretation of response or lack thereof to  symptom management which can be quite subjective.

## 2019-01-08 NOTE — Assessment & Plan Note (Addendum)
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 - try off cozar 11/03/2014 >>> - neurontin 100 tid rx 11/30/14 > improved 01/03/2015 > changed to lyrica 04/2015 but not taking consistently as of 01/30/2016  - sinus CT 02/17/15 > neg  - FENO  04/14/17 =  14  - rechallenged with gabapentin 300 qid 07/22/2018 and if not better > refer to Dr Joya Gaskins at [facility] voice center > did not go, referred again 01/07/2019    Only able to tol gabapentin 300 tid as of 01/07/2019 so no additional measures chronically feasible > encouraged to keep appt with Dr Joya Gaskins this time  And f/u here prn for this problem/ no further narcs p today which are intended for short term only in order to interrupt cyclical coughing     I had an extended discussion with the patient reviewing all relevant studies completed to date and  lasting 25 minutes of a 40  minute acute office  visit addressing  severe non-specific but potentially very serious refractory respiratory symptoms of uncertain and potentially multiple  Etiologies in pt pt not understanding how to take meds correctly.  See device teaching which extended face to face time for this visit   Each maintenance medication was reviewed in detail including most importantly the difference between maintenance and prns and under what circumstances the prns are to be triggered using an action plan format that is not reflected in the computer generated alphabetically organized AVS.    Please see AVS for specific instructions unique to this office visit that I personally wrote and verbalized to the the pt in detail and then reviewed with pt  by my nurse highlighting any changes in therapy/plan of care  recommended at today's visit.

## 2019-01-24 ENCOUNTER — Other Ambulatory Visit: Payer: Self-pay | Admitting: Internal Medicine

## 2019-02-03 ENCOUNTER — Encounter: Payer: Self-pay | Admitting: Internal Medicine

## 2019-02-03 ENCOUNTER — Other Ambulatory Visit: Payer: Self-pay

## 2019-02-03 ENCOUNTER — Ambulatory Visit: Payer: Medicare Other | Admitting: Internal Medicine

## 2019-02-03 ENCOUNTER — Ambulatory Visit (INDEPENDENT_AMBULATORY_CARE_PROVIDER_SITE_OTHER): Payer: Medicare Other | Admitting: Internal Medicine

## 2019-02-03 VITALS — BP 134/72 | HR 82 | Temp 98.4°F | Ht 65.6 in | Wt 192.2 lb

## 2019-02-03 DIAGNOSIS — I129 Hypertensive chronic kidney disease with stage 1 through stage 4 chronic kidney disease, or unspecified chronic kidney disease: Secondary | ICD-10-CM

## 2019-02-03 DIAGNOSIS — E6609 Other obesity due to excess calories: Secondary | ICD-10-CM | POA: Diagnosis not present

## 2019-02-03 DIAGNOSIS — Z6831 Body mass index (BMI) 31.0-31.9, adult: Secondary | ICD-10-CM

## 2019-02-03 DIAGNOSIS — N182 Chronic kidney disease, stage 2 (mild): Secondary | ICD-10-CM

## 2019-02-03 DIAGNOSIS — R05 Cough: Secondary | ICD-10-CM

## 2019-02-03 DIAGNOSIS — R053 Chronic cough: Secondary | ICD-10-CM

## 2019-02-03 DIAGNOSIS — E1122 Type 2 diabetes mellitus with diabetic chronic kidney disease: Secondary | ICD-10-CM | POA: Diagnosis not present

## 2019-02-03 LAB — POCT URINALYSIS DIPSTICK
Bilirubin, UA: NEGATIVE
Blood, UA: NEGATIVE
Glucose, UA: NEGATIVE
Ketones, UA: NEGATIVE
Leukocytes, UA: NEGATIVE
Nitrite, UA: NEGATIVE
Protein, UA: NEGATIVE
Spec Grav, UA: 1.02 (ref 1.010–1.025)
Urobilinogen, UA: 0.2 E.U./dL
pH, UA: 7 (ref 5.0–8.0)

## 2019-02-03 LAB — POCT UA - MICROALBUMIN
Albumin/Creatinine Ratio, Urine, POC: 30
Creatinine, POC: 200 mg/dL
Microalbumin Ur, POC: 30 mg/L

## 2019-02-03 NOTE — Patient Instructions (Signed)
Diabetes Mellitus and Foot Care  Foot care is an important part of your health, especially when you have diabetes. Diabetes may cause you to have problems because of poor blood flow (circulation) to your feet and legs, which can cause your skin to:   Become thinner and drier.   Break more easily.   Heal more slowly.   Peel and crack.  You may also have nerve damage (neuropathy) in your legs and feet, causing decreased feeling in them. This means that you may not notice minor injuries to your feet that could lead to more serious problems. Noticing and addressing any potential problems early is the best way to prevent future foot problems.  How to care for your feet  Foot hygiene   Wash your feet daily with warm water and mild soap. Do not use hot water. Then, pat your feet and the areas between your toes until they are completely dry. Do not soak your feet as this can dry your skin.   Trim your toenails straight across. Do not dig under them or around the cuticle. File the edges of your nails with an emery board or nail file.   Apply a moisturizing lotion or petroleum jelly to the skin on your feet and to dry, brittle toenails. Use lotion that does not contain alcohol and is unscented. Do not apply lotion between your toes.  Shoes and socks   Wear clean socks or stockings every day. Make sure they are not too tight. Do not wear knee-high stockings since they may decrease blood flow to your legs.   Wear shoes that fit properly and have enough cushioning. Always look in your shoes before you put them on to be sure there are no objects inside.   To break in new shoes, wear them for just a few hours a day. This prevents injuries on your feet.  Wounds, scrapes, corns, and calluses   Check your feet daily for blisters, cuts, bruises, sores, and redness. If you cannot see the bottom of your feet, use a mirror or ask someone for help.   Do not cut corns or calluses or try to remove them with medicine.   If you  find a minor scrape, cut, or break in the skin on your feet, keep it and the skin around it clean and dry. You may clean these areas with mild soap and water. Do not clean the area with peroxide, alcohol, or iodine.   If you have a wound, scrape, corn, or callus on your foot, look at it several times a day to make sure it is healing and not infected. Check for:  ? Redness, swelling, or pain.  ? Fluid or blood.  ? Warmth.  ? Pus or a bad smell.  General instructions   Do not cross your legs. This may decrease blood flow to your feet.   Do not use heating pads or hot water bottles on your feet. They may burn your skin. If you have lost feeling in your feet or legs, you may not know this is happening until it is too late.   Protect your feet from hot and cold by wearing shoes, such as at the beach or on hot pavement.   Schedule a complete foot exam at least once a year (annually) or more often if you have foot problems. If you have foot problems, report any cuts, sores, or bruises to your health care provider immediately.  Contact a health care provider if:     You have a medical condition that increases your risk of infection and you have any cuts, sores, or bruises on your feet.   You have an injury that is not healing.   You have redness on your legs or feet.   You feel burning or tingling in your legs or feet.   You have pain or cramps in your legs and feet.   Your legs or feet are numb.   Your feet always feel cold.   You have pain around a toenail.  Get help right away if:   You have a wound, scrape, corn, or callus on your foot and:  ? You have pain, swelling, or redness that gets worse.  ? You have fluid or blood coming from the wound, scrape, corn, or callus.  ? Your wound, scrape, corn, or callus feels warm to the touch.  ? You have pus or a bad smell coming from the wound, scrape, corn, or callus.  ? You have a fever.  ? You have a red line going up your leg.  Summary   Check your feet every day  for cuts, sores, red spots, swelling, and blisters.   Moisturize feet and legs daily.   Wear shoes that fit properly and have enough cushioning.   If you have foot problems, report any cuts, sores, or bruises to your health care provider immediately.   Schedule a complete foot exam at least once a year (annually) or more often if you have foot problems.  This information is not intended to replace advice given to you by your health care provider. Make sure you discuss any questions you have with your health care provider.  Document Released: 12/13/2000 Document Revised: 01/28/2018 Document Reviewed: 01/17/2017  Elsevier Interactive Patient Education  2019 [employer]

## 2019-02-04 DIAGNOSIS — Z1231 Encounter for screening mammogram for malignant neoplasm of breast: Secondary | ICD-10-CM | POA: Diagnosis not present

## 2019-02-04 LAB — BMP8+EGFR
BUN/Creatinine Ratio: 8 — ABNORMAL LOW (ref 12–28)
BUN: 6 mg/dL — ABNORMAL LOW (ref 8–27)
CO2: 26 mmol/L (ref 20–29)
Calcium: 9.8 mg/dL (ref 8.7–10.3)
Chloride: 101 mmol/L (ref 96–106)
Creatinine, Ser: 0.79 mg/dL (ref 0.57–1.00)
GFR calc Af Amer: 87 mL/min/{1.73_m2} (ref >59)
GFR calc non Af Amer: 76 mL/min/{1.73_m2} (ref >59)
Glucose: 91 mg/dL (ref 65–99)
Potassium: 4.7 mmol/L (ref 3.5–5.2)
Sodium: 143 mmol/L (ref 134–144)

## 2019-02-04 LAB — LIPID PANEL
Chol/HDL Ratio: 3.2 ratio (ref 0.0–4.4)
Cholesterol, Total: 182 mg/dL (ref 100–199)
HDL: 57 mg/dL (ref >39)
LDL Calculated: 101 mg/dL — ABNORMAL HIGH (ref 0–99)
Triglycerides: 121 mg/dL (ref 0–149)
VLDL Cholesterol Cal: 24 mg/dL (ref 5–40)

## 2019-02-04 LAB — HEMOGLOBIN A1C
Est. average glucose Bld gHb Est-mCnc: 143 mg/dL
Hgb A1c MFr Bld: 6.6 % — ABNORMAL HIGH (ref 4.8–5.6)

## 2019-02-06 NOTE — Progress Notes (Signed)
Subjective:     Patient ID: [name] , female    DOB: [birth year] , 73 y.o.   MRN: [id number]   Chief Complaint  Patient presents with  . Diabetes  . Hypertension    HPI  Diabetes  She presents for her follow-up diabetic visit. She has type 2 diabetes mellitus. Her disease course has been stable. There are no hypoglycemic associated symptoms. Pertinent negatives for diabetes include no blurred vision and no chest pain. There are no hypoglycemic complications. Risk factors for coronary artery disease include diabetes mellitus, dyslipidemia, hypertension, obesity, sedentary lifestyle and post-menopausal. She participates in exercise intermittently. Her breakfast blood glucose is taken between 8-9 am. Her breakfast blood glucose range is generally 110-130 mg/dl. Eye exam is current.  Hypertension  This is a chronic problem. The current episode started more than 1 year ago. The problem has been gradually improving since onset. The problem is controlled. Pertinent negatives include no blurred vision, chest pain, palpitations or shortness of breath.   She reports compliance with meds.   Past Medical History:  Diagnosis Date  . COPD (chronic obstructive pulmonary disease) ([place])   . Diabetes ([place])   . High cholesterol   . Hypertension   . Pneumonia 09/10/2017     Family History  Problem Relation Age of Onset  . Heart murmur Mother   . Heart attack Father   . Diabetes Father   . Diabetes Brother      Current Outpatient Medications:  .  ACCU-CHEK FASTCLIX LANCETS MISC, Inject 1 each as directed daily. Use as directed to check blood sugars daily dx: e11.22, Disp: 50 each, Rfl: 11 .  albuterol (PROAIR HFA) 108 (90 Base) MCG/ACT inhaler, Inhale 2 puffs into the lungs every 4 (four) hours as needed for wheezing or shortness of breath., Disp: 1 Inhaler, Rfl: 11 .  aspirin 81 MG tablet, Take 81 mg by mouth daily. , Disp: , Rfl:  .  bisoprolol (ZEBETA) 5 MG tablet, TAKE 1/2 A TABLET BY  MOUTH EVERY DAY, Disp: 45 tablet, Rfl: 0 .  budesonide-formoterol (SYMBICORT) 80-4.5 MCG/ACT inhaler, Inhale 2 puffs into the lungs 2 (two) times daily., Disp: 1 Inhaler, Rfl: 0 .  CALCIUM-MAGNESIUM-ZINC PO, Take 1 capsule by mouth daily., Disp: , Rfl:  .  Cholecalciferol (VITAMIN D3) 2000 UNITS TABS, Take 2,000 Units by mouth daily., Disp: , Rfl:  .  famotidine (PEPCID) 20 MG tablet, TAKE 1 TABLET BY MOUTH AT BEDTIME, Disp: 90 tablet, Rfl: 3 .  gabapentin (NEURONTIN) 300 MG capsule, Take 1 capsule (300 mg total) by mouth 3 (three) times daily., Disp: 90 capsule, Rfl: 2 .  glucose blood (ACCU-CHEK GUIDE) test strip, 1 each by Other route daily. Use as instructed to check blood sugars 1 time per day dx: e11.22, Disp: 50 each, Rfl: 11 .  ipratropium-albuterol (DUONEB) 0.5-2.5 (3) MG/3ML SOLN, Take 3 mLs by nebulization every 6 (six) hours as needed. DX: J44.9, Disp: 120 mL, Rfl: 2 .  montelukast (SINGULAIR) 10 MG tablet, TAKE 1 TABLET (10 MG TOTAL) BY MOUTH AT BEDTIME., Disp: 30 tablet, Rfl: 11 .  oxyCODONE (ROXICODONE) 5 MG immediate release tablet, Take 1 tablet (5 mg total) by mouth every 4 (four) hours as needed (as needed for severe chronic cough). prn for severe chronic cough, Disp: 40 tablet, Rfl: 0 .  OZEMPIC, 0.25 OR 0.5 MG/DOSE, 2 MG/1.5ML SOPN, INJECT 0.5 MG UNDER THE SKIN ONCE A WEEK, Disp: 1.5 mL, Rfl: 1 .  rosuvastatin (CRESTOR) 20 MG  tablet, Take 20 mg by mouth every evening. , Disp: , Rfl:  .  pantoprazole (PROTONIX) 40 MG tablet, Take 40 mg by mouth daily., Disp: , Rfl:  .  predniSONE (DELTASONE) 10 MG tablet, Take  4 each am x 2 days,   2 each am x 2 days,  1 each am x 2 days and stop (Patient not taking: Reported on 02/03/2019), Disp: 14 tablet, Rfl: 0   Allergies  Allergen Reactions  . Other Itching    HAIR DYE     Review of Systems  Constitutional: Negative.   Eyes: Negative for blurred vision.  Respiratory: Negative.  Negative for shortness of breath.   Cardiovascular:  Negative.  Negative for chest pain and palpitations.  Gastrointestinal: Negative.   Neurological: Negative.   Psychiatric/Behavioral: Negative.      Today's Vitals   02/03/19 1505  BP: 134/72  Pulse: 82  Temp: 98.4 F (36.9 C)  TempSrc: Oral  SpO2: 98%  Weight: 192 lb 3.2 oz (87.2 kg)  Height: 5' 5.6" (1.666 m)   Body mass index is 31.4 kg/m.   Objective:  Physical Exam Vitals signs and nursing note reviewed.  Constitutional:      Appearance: Normal appearance. She is obese.  HENT:     Head: Normocephalic and atraumatic.  Cardiovascular:     Rate and Rhythm: Normal rate and regular rhythm.     Pulses:          Dorsalis pedis pulses are 2+ on the right side and 2+ on the left side.     Heart sounds: Normal heart sounds.  Pulmonary:     Effort: Pulmonary effort is normal.     Breath sounds: Normal breath sounds.  Feet:     Right foot:     Protective Sensation: 5 sites tested. 5 sites sensed.     Skin integrity: Skin integrity normal.     Left foot:     Protective Sensation: 5 sites tested. 5 sites sensed.     Skin integrity: Skin integrity normal.  Skin:    General: Skin is warm.  Neurological:     General: No focal deficit present.     Mental Status: She is alert.  Psychiatric:        Mood and Affect: Mood normal.         Assessment And Plan:     1. Diabetes mellitus with stage 2 chronic kidney disease ([place])  Diabetic foot exam was performed.  I will check labs as listed below.  She is encouraged to incorporate more exercise into her daily routine. Regarding CKD she is encouraged to stay well hydrated.   - BMP8+EGFR - Hemoglobin A1c - Lipid Profile - POCT Urinalysis Dipstick (81002) - POCT UA - Microalbumin  2. Parenchymal renal hypertension, stage 1 through stage 4 or unspecified chronic kidney disease  Fair control. She is aware that optimal bp is less than 130/80. She is encouraged to avoid adding salt to her foods. She will continue with current  meds.   3. Chronic cough  Chronic. She has appt scheduled at [facility] for f/u later this month.   4. Class 1 obesity due to excess calories with serious comorbidity and body mass index (BMI) of 31.0 to 31.9 in adult  Importance of achieving optimal weight to decrease risk of cardiovascular disease and cancers was discussed with the patient in full detail. She is encouraged to start slowly - start with 10 minutes twice daily at least three to  four days per week and to gradually build to 30 minutes five days weekly. She was given tips to incorporate more activity into her daily routine - take stairs when possible, park farther away from grocery stores, etc.       [name], MD

## 2019-02-13 ENCOUNTER — Other Ambulatory Visit: Payer: Self-pay | Admitting: Internal Medicine

## 2019-02-15 ENCOUNTER — Encounter: Payer: Self-pay | Admitting: Internal Medicine

## 2019-02-15 ENCOUNTER — Other Ambulatory Visit: Payer: Self-pay

## 2019-02-15 MED ORDER — IPRATROPIUM-ALBUTEROL 0.5-2.5 (3) MG/3ML IN SOLN: 3.0000 mL | Freq: Four times a day (QID) | RESPIRATORY_TRACT | 2 refills | Status: DC | PRN

## 2019-02-15 MED ORDER — LIDOCAINE 5 % EX PTCH: 1.0000 | MEDICATED_PATCH | CUTANEOUS | 1 refills | Status: DC

## 2019-02-15 NOTE — Telephone Encounter (Signed)
Gabapentin refill

## 2019-02-17 ENCOUNTER — Other Ambulatory Visit: Payer: Self-pay

## 2019-02-22 ENCOUNTER — Other Ambulatory Visit: Payer: Self-pay | Admitting: Internal Medicine

## 2019-02-25 DIAGNOSIS — J45909 Unspecified asthma, uncomplicated: Secondary | ICD-10-CM | POA: Diagnosis not present

## 2019-02-25 DIAGNOSIS — M351 Other overlap syndromes: Secondary | ICD-10-CM | POA: Diagnosis not present

## 2019-02-25 DIAGNOSIS — J449 Chronic obstructive pulmonary disease, unspecified: Secondary | ICD-10-CM | POA: Diagnosis not present

## 2019-02-25 DIAGNOSIS — R05 Cough: Secondary | ICD-10-CM | POA: Diagnosis not present

## 2019-02-26 ENCOUNTER — Telehealth: Payer: Self-pay | Admitting: Internal Medicine

## 2019-02-26 DIAGNOSIS — R059 Cough, unspecified: Secondary | ICD-10-CM

## 2019-02-26 DIAGNOSIS — R05 Cough: Secondary | ICD-10-CM

## 2019-02-26 NOTE — Telephone Encounter (Signed)
It should be the [place] [facility] - whoever can see her there but if they have no one listed at that center then [place] ent division will do as they are probably seeing the pts he previously saw

## 2019-02-26 NOTE — Telephone Encounter (Signed)
Spoke with pt. She is aware of MW's response. Referral has been placed. Nothing further was needed.

## 2019-02-26 NOTE — Telephone Encounter (Signed)
Spoke with pt in regards to the MyChart message she sent to [place]. Braulio Conte, RN advised the pt her the referral in the chart that Dr. Joya Gaskins is no longer with [facility]. Her PCP set her up with another doctor in Dr. Bettina Gavia previous office but he is not an ENT. Pt is wanting MW's recommendation on another ENT.  MW - please advise. Thanks.

## 2019-02-28 ENCOUNTER — Encounter: Payer: Self-pay | Admitting: Internal Medicine

## 2019-03-02 ENCOUNTER — Encounter: Payer: Self-pay | Admitting: Internal Medicine

## 2019-03-02 DIAGNOSIS — R490 Dysphonia: Secondary | ICD-10-CM | POA: Diagnosis not present

## 2019-03-02 DIAGNOSIS — R05 Cough: Secondary | ICD-10-CM | POA: Diagnosis not present

## 2019-03-02 DIAGNOSIS — J384 Edema of larynx: Secondary | ICD-10-CM | POA: Diagnosis not present

## 2019-03-02 DIAGNOSIS — K219 Gastro-esophageal reflux disease without esophagitis: Secondary | ICD-10-CM | POA: Diagnosis not present

## 2019-03-17 ENCOUNTER — Encounter: Payer: Self-pay | Admitting: Internal Medicine

## 2019-03-28 ENCOUNTER — Other Ambulatory Visit: Payer: Self-pay | Admitting: Internal Medicine

## 2019-04-12 DIAGNOSIS — K219 Gastro-esophageal reflux disease without esophagitis: Secondary | ICD-10-CM | POA: Diagnosis not present

## 2019-04-12 DIAGNOSIS — J387 Other diseases of larynx: Secondary | ICD-10-CM | POA: Diagnosis not present

## 2019-04-12 DIAGNOSIS — R490 Dysphonia: Secondary | ICD-10-CM | POA: Diagnosis not present

## 2019-04-12 DIAGNOSIS — R05 Cough: Secondary | ICD-10-CM | POA: Diagnosis not present

## 2019-04-15 NOTE — Telephone Encounter (Signed)
Contacted patient by phone regarding her request for oxycodone.  Patient states she is following Dr. Gustavus Bryant last OV plan regarding symbicort twice daily, delsym and all other recommendations but cough is still present.  She states Dr. Joya Gaskins informed her he saw 'nothing to cause the cough that he could do anything about' and referred her to Dr. Kathyrn Drown who is doing 'cough therapy' which start on Monday 04/12/19.  She was told this would take a minimum of 8-12 weeks to note any improvement.  Dr. Joya Gaskins had prescribed Tramadol which patient states did not help 'at all.'   Patient states her symptoms are 'exactly the same as described in Jan 2020 OV.  No new SOB, fever or any other new complaints.  Will route to Dr. [name] for review and reply.  LOV Dr. [name] 01/07/19  Versions: 1. Tanda Rockers, MD (Physician) at 01/07/2019 3:27 PM - Signed    Plan A = Automatic = Symbicort 80 Take 2 puffs first thing in am and then another 2 puffs about 12 hours later.    Plan B = Backup (add this to Plan A, not in place of it)  Only use your albuterol inhaler as a rescue medication to be used if you can't catch your breath by resting or doing a relaxed purse lip breathing pattern.  - The less you use it, the better it will work when you need it. - Ok to use the inhaler up to 2 puffs  every 4 hours if you must but call for appointment if use goes up over your usual need - Don't leave home without it !!  (think of it like the spare tire for your car)   Plan C = Crisis - only use your albuterol nebulizer if you first try Plan B and it fails to help > ok to use the nebulizer up to every 4 hours but if start needing it regularly call for immediate appointment  For drainage / throat tickle try take CHLORPHENIRAMINE  4 mg(Chlortab at [facility]) - take one every 4 hours as needed - available over the counter- may cause drowsiness so start with just a bedtime dose or two and see how you tolerate it before trying in daytime     Take delsym two tsp every 12 hours and supplement if needed with  vicodine up to 2 every 4 hours to suppress the urge to cough. Swallowing water and/or using ice chips/non mint and menthol containing candies (such as lifesavers or sugarless jolly ranchers) are also effective.  You should rest your voice and avoid activities that you know make you cough.  Once you have eliminated the cough for 3 straight days try reducing the vicodin first,  then the delsym as tolerated.    Prednisone 10 mg take  4 each am x 2 days,   2 each am x 2 days,  1 each am x 2 days and stop   If not improving > See Tammy NP s with all your medications, even over the counter meds, separated in two separate bags, the ones you take no matter what vs the ones you stop once you feel better and take only as needed when you feel you need them.   Tammy  will generate for you a new user friendly medication calendar that will put [place] all on the same page re: your medication use.     Without this process, it simply isn't possible to assure that we are providing  your outpatient  care  with  the attention to detail we feel you deserve.   If we cannot assure that you're getting that kind of care,  then we cannot manage your problem effectively from this clinic.  Once you have seen Tammy and we are sure that we're all on the same page with your medication use she will arrange follow up with me.        Dr. [name] please advise.  Thank you

## 2019-04-15 NOTE — Telephone Encounter (Signed)
Can't help her over the phone for this problem which is why I referred her to Dr Joya Gaskins.  Since the corona  Virus may be limiting her from face to face encounters with Dr Joya Gaskins , would offer to see as add on 4/17 to pm and work thru this problem but must come with with all active meds in hand to troubleshoot as multiple providers now treating the same problem

## 2019-04-15 NOTE — Telephone Encounter (Signed)
Contacted patient by phone to relay Dr. Gustavus Bryant suggestion for patient to come to office 04/16/19 with ALL medications (including OTC and meds she occasionally uses).  Patient states she doesn't see why this is necessary but agreed to come.  Explained that Dr. [name] needs to see all her medications in order to complete this visit so be sure to bring those and the in-person visit is needed to so he can review everything with her face-to-face due to multiple physicians providing her care for this cough.  Appointment scheduled 04/16/19 at 4:00 pm at Dr. Gustavus Bryant request.  Nothing further needed

## 2019-04-16 ENCOUNTER — Other Ambulatory Visit: Payer: Self-pay | Admitting: Internal Medicine

## 2019-04-16 ENCOUNTER — Other Ambulatory Visit: Payer: Self-pay

## 2019-04-16 ENCOUNTER — Encounter: Payer: Self-pay | Admitting: Internal Medicine

## 2019-04-16 ENCOUNTER — Ambulatory Visit (INDEPENDENT_AMBULATORY_CARE_PROVIDER_SITE_OTHER): Payer: Medicare Other | Admitting: Internal Medicine

## 2019-04-16 VITALS — BP 130/74 | HR 77 | Ht 65.5 in | Wt 193.4 lb

## 2019-04-16 DIAGNOSIS — J449 Chronic obstructive pulmonary disease, unspecified: Secondary | ICD-10-CM | POA: Diagnosis not present

## 2019-04-16 DIAGNOSIS — R05 Cough: Secondary | ICD-10-CM

## 2019-04-16 DIAGNOSIS — R059 Cough, unspecified: Secondary | ICD-10-CM

## 2019-04-16 MED ORDER — PREDNISONE 10 MG PO TABS: ORAL_TABLET | ORAL | 0 refills | Status: DC

## 2019-04-16 MED ORDER — GABAPENTIN 300 MG PO CAPS: 300.0000 mg | ORAL_CAPSULE | Freq: Four times a day (QID) | ORAL | 2 refills | Status: DC

## 2019-04-16 MED ORDER — OXYCODONE HCL 5 MG PO TABS: 5.0000 mg | ORAL_TABLET | ORAL | 0 refills | Status: DC | PRN

## 2019-04-16 NOTE — Progress Notes (Signed)
Subjective:   Patient ID: [name], female    DOB: 1946-04-11    MRN: [id number]    Brief patient profile:  16  yobf mother of a PA in [employer] quit smoking 2007 with chronic cough x 2011 self referred for evaluation of persistent cough in setting of technically GOLD II copd dx 02/2014 with only mild obstructive pattern - has noted since high school a tendency to daily  tickle in throat causing intermittent cough waxing and waning ever since    History of Present Illness  10/19/2014 1st [facility] office visit/ [name]   Chief Complaint  Patient presents with  . Pulmonary Consult    Former pt of Dr. Annamaria Boots. Pt c/o increased cough x 3 wks. Cough is prod with moderate, thick, light yellow sputum.  She coughs until loses urinary continence.  Not sleeping well due to cough.   remembers having tendency to throat tickle in high school  but completely resolved by graduation  then recurred in her 52s and resolved with codeine then recurred in 2011  but bad to worse x 3 weeks assoc with sensation of choking, mucus is thick light yellow.  acei stopped about a month prior to OV  Replaced by losartan  Inhalers make it worse, esp dpi  Kouffman Reflux v Neurogenic Cough Differentiator Reflux Comments  Do you awaken from a sound sleep coughing violently?                            With trouble breathing? Yes   Do you have choking episodes when you cannot  Get enough air, gasping for air ?              Yes   Do you usually cough when you lie down into  The bed, or when you just lie down to rest ?                          Yes   Do you usually cough after meals or eating?         maybe   Do you cough when (or after) you bend over?    No    GERD SCORE     Kouffman Reflux v Neurogenic Cough Differentiator Neurogenic   Do you more-or-less cough all day long? yes   Does change of temperature make you cough? no   Does laughing or chuckling cause you to cough? no   Do fumes (perfume, automobile fumes,  burned  Toast, etc.,) cause you to cough ?      Not aware   Does speaking, singing, or talking on the phone cause you to cough   ?               sometimes   Neurogenic/Airway score      rec  Max rx gerd/prn tramadol        09/15/2017  Transition of care  f/u ov/[name] re: COPD II s/p aecopd Chief Complaint  Patient presents with  . Hospitalization Follow-up    Breathing has improved some, but not back at her normal baseline. She states her cough woke her up this am and had some bloody nasal d/c.  She has been coughing up some thick, yellow sputum.  She is using   walking all over [place] fine,  Flew back to [place]  on a commercial jet and short of breath  walking  from gate [place] then by 2 days later in ER due to severe sob / tightness across upper back >> albuterol relived the discomfort w/in 5 min but did not have her rescue so had to call 911 (proair listed on med calendar she just got from me 08/19/17 under written action plan) - says was so short of breath at that point could not have used saba anyway. Not following action plan either re controlling cough which is also worse/ has not started meds from discharge  rec Plan A = Automatic = symbicort 80 Take 2 puffs first thing in am and then another 2 puffs about 12 hours later.  Work on Interior and spatial [employer]: Plan B = Backup Only use your albuterol as a rescue medication       03/23/2018 acute extended  ov/[name] re:   Copd  - extended ov re new noct cough /wheeze and need for saba / no med calendar Chief Complaint  Patient presents with  . Acute Visit    Breathing is overall doing well. She uses her albuterol inhaler 4 x per wk on average. She has not needed her neb.  Mid February breathing /coughing  worse / wheezing every noct needs albuterol every noct Very confused with instructions/ now has neb but did not disclose med she uses at the med calendar ov and did not bring the med calendar with her nor the med for the  nebulizer and easily frustrated re concept of med reconciliation  Daytime sob = MMRC2 = can't walk a nl pace on a flat grade s sob but does fine slow and flat  rec Plan A = Automatic = symbicort 160 Take 2 puffs first thing in am and then another 2 puffs about 12 hours later.  Plan B = Backup Only use your albuterol as a rescue medication  Plan C = Crisis - only use your albuterol nebulizer if you first try Plan B and it fails to help > ok to use the nebulizer up to every 4 hours but if start needing it regularly call for immediate appointment     04/20/2018  f/u ov/[name] re:   Copd gold II / brought med calendar but not clear using consistently / chronic cough/ hbp Chief Complaint  Patient presents with  . Follow-up    COPD , feel good no problems at this time  Dyspnea:  Dances   2- 3 songs / shopping is ok  Cough: better, mostly dry/ throat clearing / noct  Tessalon maybe once every couple days  "it says to take it 3 x daily"  Sleep: better  while maint on symbicort 160 2bid Sleeping fine on 1 pillow     Symbicort 80 or 160 should be  Take 2 puffs first thing in am and then another 2 puffs about 12 hours later- you can pick which one you prefer but if  they are the same then use the lower strength Please schedule a follow up visit in 3 months but call sooner if needed     07/22/2018  f/u ov/[name] re:   GOLD II copd / has calendar not using the action plans at bottom/ since previous ov stopped lyrica and took gabapentin 100 tid x 3 weeks then stopped (was supposed to stay on until ov) Chief Complaint  Patient presents with  . Follow-up    Cough has improved slightly but "still driving me crazy"- prod with clear sputum.  She uses her proair 2  x per wk and rarely uses neb.   Dyspnea:   MMRC2 = can't walk a nl pace on a flat grade s sob but does fine slow and flat  Cough  Tickle/ gag anytime day except doesn't wake her up goes back to childhood SABA use: twice daily on bad days but  recently less / hardly ever uses neb  Says only thing that stops her cough is oxycodone rec Take delsym two tsp every 12 hours and supplement if needed with  Oxycodone  up to 1 every 4 hours to suppress the urge to cough.  Once you have eliminated the cough for 3 straight days try reducing the oxycodone first,  then the delsym as tolerated.   For drainage / throat tickle try take CHLORPHENIRAMINE  4 mg - take one every 4 hours as needed   Reduce symbicort 80 Take 2 puffs first thing in am and then another 2 puffs about 12 hours later. > did not continue consistently "you didn't make this clear"  Gabapentin 300 mg four times a day - if not doing better we need to refer you to [facility] / Dr Joya Gaskins > did not do  See Tammy NP in 6  weeks with all your medications>  Did not do    01/07/2019  Acute extended  ov/[name] re:  Flare of wheeze off symbicort, cough never resolved / no med calendar/ poor insight into meds/ action plans  Chief Complaint  Patient presents with  . Acute Visit    pt c/o wheezing, prod cough with gray/white mucus, pnd, stuffy nose.   Dyspnea:  MMRC3 = can't walk 100 yards even at a slow pace at a flat grade s stopping due to sob   Cough: severe fits of coughing day and noc  Sleeping: poorly due to cough and wheeze SABA use: qid instead of symbicort  rec Plan A = Automatic = Symbicort 80 Take 2 puffs first thing in am and then another 2 puffs about 12 hours later.  Plan B = Backup (add this to Plan A, not in place of it)  Only use your albuterol inhaler as a rescue medication Plan C = Crisis - only use your albuterol nebulizer if you first try Plan B For drainage / throat tickle try take CHLORPHENIRAMINE  4 mg(Chlortab at [facility]) -  Take delsym two tsp every 12 hours and supplement if needed with  vicodine up to 2 every 4 hours to suppress the urge to cough. Swallowing water and/or using ice chips/non mint and menthol containing candies (such as lifesavers or sugarless  jolly ranchers) are also effective.  You should rest your voice and avoid activities that you know make you cough. Once you have eliminated the cough for 3 straight days try reducing the vicodin first,  then the delsym as tolerated.   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 We discussed treatment options including observation, continuing PPI therapy, an evaluation in the voice lab to assess her candidacy for voice therapy and respiratory retraining, or a trial of tramadol.    04/16/2019 acute  extended ov/ [name] re recurrent cough on gabapentin 300 tid / did not fill tramadol "I told Dr Joya Gaskins it wouldn't work" "only better with oxycodone"  Chief Complaint  Patient presents with  . Acute Visit    Increased cough x 3 wks- prod with white sputum. She has had some wheezing also. She is using her albuterol inhaler at least once per day and rarely uses neb.  Dyspnea:  Minimal  Cough: worse tickle/ no h1 on hand says plans to pick it up  Sleeping: fine 2 pillows / not typically waking her up  SABA use: rarely  02: none  Doesn't think delsym helped/ no h1 on hand / using gabapentin 300 mg tid   gen chest discomfort with severe coughing fits    No obvious day to day or daytime variability or assoc   purulent sputum or mucus plugs or hemoptysis or cp or chest tightness, subjective wheeze or overt sinus or hb symptoms.   Sleeping as above without nocturnal  or early am exacerbation  of respiratory  c/o's or need for noct saba. Also denies any obvious fluctuation of symptoms with weather or environmental changes or other aggravating or alleviating factors except as outlined  above   No unusual exposure hx or h/o childhood pna/ asthma or knowledge of premature birth.  Current Allergies, Complete Past Medical History, Past Surgical History, Family History, and Social History were reviewed in [employer] record.  ROS  The following are not active complaints unless bolded Hoarseness, sore throat, dysphagia, dental problems, itching, sneezing,  nasal congestion or discharge of excess mucus or purulent secretions, ear ache,   fever, chills, sweats, unintended wt loss or wt gain, classically pleuritic or exertional cp,  orthopnea pnd or arm/hand swelling  or leg swelling, presyncope, palpitations, abdominal pain, anorexia, nausea, vomiting, diarrhea  or change in bowel habits or change in bladder habits, change in stools or change in urine, dysuria, hematuria,  rash, arthralgias, visual complaints, headache, numbness, weakness or ataxia or problems with walking or coordination,  change in mood or  memory.        Current Meds  Medication Sig  . ACCU-CHEK FASTCLIX LANCETS MISC Inject 1 each as directed daily. Use as directed to check blood sugars daily dx: e11.22  . albuterol (PROAIR HFA) 108 (90 Base) MCG/ACT inhaler Inhale 2 puffs into the lungs every 4 (four) hours as needed for wheezing or shortness of breath.  Marland Kitchen aspirin 81 MG tablet Take 81 mg by mouth daily.   . bisoprolol (ZEBETA) 5 MG tablet TAKE 1/2 A TABLET BY MOUTH EVERY DAY  . budesonide-formoterol (SYMBICORT) 80-4.5 MCG/ACT inhaler Inhale 2 puffs into the lungs 2 (two) times daily.  Marland Kitchen CALCIUM-MAGNESIUM-ZINC PO Take 1 capsule by mouth daily.  . Cholecalciferol (VITAMIN D3) 2000 UNITS TABS Take 2,000 Units by mouth daily.  . famotidine (PEPCID) 20 MG tablet TAKE 1 TABLET BY MOUTH AT BEDTIME  . gabapentin (NEURONTIN) 300 MG capsule TAKE 1 CAPSULE(300 MG) BY MOUTH THREE TIMES DAILY  . glucose blood (ACCU-CHEK GUIDE) test strip 1 each by Other route daily. Use as instructed to check blood  sugars 1 time per day dx: e11.22  . lidocaine (LIDODERM) 5 % Place 1 patch onto the skin daily. Remove & Discard patch within 12 hours or as directed by MD  . montelukast (SINGULAIR) 10 MG tablet TAKE 1 TABLET(10 MG) BY MOUTH AT BEDTIME  . OZEMPIC, 0.25 OR 0.5 MG/DOSE, 2 MG/1.5ML SOPN INJECT 0.5 MG  UNDER THE SKIN EVERY WEEKLY.  . pantoprazole (PROTONIX) 40 MG tablet TAKE 1 TABLET BY MOUTH EVERY DAY 30 TO 60 MINUTES BEFORE FIRST MEAL OF THE DAY  . rosuvastatin (CRESTOR) 20 MG tablet Take 20 mg by mouth every evening.                        Objective:   Physical Exam    ambulatory wm, mildly hoarse  04/07/2015 207  > 04/21/2015 208 > 06/01/2015  210>211 09/08/2015 > 01/30/2016   184 > 10/23/2016  190 > 04/14/2017   183 > 08/19/2017  185 >  03/23/2018  188 > 04/20/2018  189 > 07/22/2018  195 > 01/07/2019  193 > 04/16/2019  193    HEENT: nl dentition / oropharynx. Nl external ear canals without cough reflex -  Mild bilateral non-specific turbinate edema     NECK :  without JVD/Nodes/TM/ nl carotid upstrokes bilaterally   LUNGS: no acc muscle use,  Mild barrel  contour chest wall with bilateral  Distant bs s audible wheeze and  without cough on insp or exp maneuver and mild  Hyperresonant  to  percussion bilaterally     CV:  RRR  no s3 or murmur or increase in P2, and no edema   ABD:  soft and nontender with pos end  insp Hoover's  in the supine position. No bruits or organomegaly appreciated, bowel sounds nl  MS:   Nl gait/  ext warm without deformities, calf tenderness, cyanosis or clubbing No obvious joint restrictions   SKIN: warm and dry without lesions    NEURO:  alert, approp, nl sensorium with  no motor or cerebellar deficits apparent.

## 2019-04-16 NOTE — Patient Instructions (Addendum)
Prednisone 10 mg take  4 each am x 2 days,   2 each am x 2 days,  1 each am x 2 days and stop   Take delsym two tsp every 12 hours and supplement if needed with oxidocone 5 mg up to 2 every 4 hours to suppress the urge to cough. Swallowing water and/or using ice chips/non mint and menthol containing candies (such as lifesavers or sugarless jolly ranchers) are also effective.  You should rest your voice and avoid activities that you know make you cough.  Once you have eliminated the cough for 3 straight days try reducing the oxycodone first,  then the delsym as tolerated.    Keep your appts with speech therapy   Please schedule a follow up visit in 3 months but call sooner if needed  with all medications /inhalers/ solutions in hand so we can verify exactly what you are taking. This includes all medications from all doctors and over the counters

## 2019-04-17 ENCOUNTER — Encounter: Payer: Self-pay | Admitting: Internal Medicine

## 2019-04-17 NOTE — Assessment & Plan Note (Addendum)
Quit smoking 2007 PFT: 03/17/2014-  FEV1  1.28 (58%) ratio 66 and no better p saba, ERV 27 and DLCO 56 corrects to 77 - 11/30/2014   try dulera 100 2bid > 01/03/2015 not clear helping,  use prn > flare since mid 02/2015 cough > sob  - 04/07/2015 p extensive coaching HFA effectiveness =    90% > try dulera 100 2bid > improved 04/21/15   - 06/01/2015   try dulera 200 2bid  > did not maintain  - Spirometry 04/14/2017  FEV1 1.13 (58%)  Ratio 52 p saba w/in 4 h > add symb 80 2bid   - 08/19/2017    resumed sym 80 2bid -  03/23/2018    try symb 160 due to new noct cough/ wheeze > improved 04/20/2018  - 07/22/2018   try symb 80 2bid due to UACS - pulmonary eval at [facility] 2/267/20 rec add spiriva smi > pt declined   Turns out based on hx today she is not limited by doe but by cough and that the cough has it's usual recurrent upper airway features that do typically respond to cyclical cough rx (see sep a/p) so no need to add the spiriva at this point but keep in mind for future consideration.  - The proper method of use, as well as anticipated side effects, of a metered-dose inhaler are discussed and demonstrated to the patient. Improved effectiveness after extensive coaching during this visit to a level of approximately 90 %  - pharmacy records requested to be sure she is complying with maint rx

## 2019-04-17 NOTE — Assessment & Plan Note (Signed)
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 - try off cozar 11/03/2014 >>> - neurontin 100 tid rx 11/30/14 > improved 01/03/2015 > changed to lyrica 04/2015 but not taking consistently as of 01/30/2016  - sinus CT 02/17/15 > neg  - FENO  04/14/17 =  14  - rechallenged with gabapentin 300 qid 07/22/2018 and if not better > refer to Dr Joya Gaskins at [facility] > did not go, referred again 01/07/2019 > see eval 03/02/2019 rec speech therapy/tramadol (pt refused latter)     What's not clear to me is whether she ever completely eliminates the throat clearing during times when her cough is "better" but advised her today that is the goal and short term improvement can be achieved with oxycodone but that we would need to very sure she is following all the other maint rx/prns correctly (most importantly including gerd and 1st gen H1 blockers per guidelines  Before prescribing this in future.   Her best bet long term is f/u at [facility] voice center/ speech path as planned.    I had an extended discussion with the patient reviewing all relevant studies completed to date and  lasting 25 minutes of a 40 minute acute office  visit    See device teaching which extended face to face time for this visit   Each maintenance medication was reviewed in detail including most importantly the difference between maintenance and prns and under what circumstances the prns are to be triggered using an action plan format that is not reflected in the computer generated alphabetically organized AVS but trather by a customized med calendar that reflects the AVS meds with confirmed 100% correlation.   In addition, Please see AVS for unique instructions that I personally wrote and verbalized to the the pt in detail and then reviewed with pt  by my nurse highlighting any  changes in therapy recommended at today's visit to their plan of care.

## 2019-04-18 ENCOUNTER — Encounter: Payer: Self-pay | Admitting: Internal Medicine

## 2019-04-19 DIAGNOSIS — R05 Cough: Secondary | ICD-10-CM | POA: Diagnosis not present

## 2019-04-19 DIAGNOSIS — J387 Other diseases of larynx: Secondary | ICD-10-CM | POA: Diagnosis not present

## 2019-04-19 DIAGNOSIS — R490 Dysphonia: Secondary | ICD-10-CM | POA: Diagnosis not present

## 2019-04-19 DIAGNOSIS — K219 Gastro-esophageal reflux disease without esophagitis: Secondary | ICD-10-CM | POA: Diagnosis not present

## 2019-04-26 ENCOUNTER — Encounter: Payer: Self-pay | Admitting: Internal Medicine

## 2019-04-26 DIAGNOSIS — K219 Gastro-esophageal reflux disease without esophagitis: Secondary | ICD-10-CM | POA: Diagnosis not present

## 2019-04-26 DIAGNOSIS — J387 Other diseases of larynx: Secondary | ICD-10-CM | POA: Diagnosis not present

## 2019-04-26 DIAGNOSIS — R490 Dysphonia: Secondary | ICD-10-CM | POA: Diagnosis not present

## 2019-04-26 DIAGNOSIS — R05 Cough: Secondary | ICD-10-CM | POA: Diagnosis not present

## 2019-04-28 ENCOUNTER — Other Ambulatory Visit: Payer: Self-pay | Admitting: Nurse Practitioner

## 2019-04-29 ENCOUNTER — Encounter: Payer: Self-pay | Admitting: Internal Medicine

## 2019-05-03 DIAGNOSIS — K219 Gastro-esophageal reflux disease without esophagitis: Secondary | ICD-10-CM | POA: Diagnosis not present

## 2019-05-03 DIAGNOSIS — J387 Other diseases of larynx: Secondary | ICD-10-CM | POA: Diagnosis not present

## 2019-05-03 DIAGNOSIS — R05 Cough: Secondary | ICD-10-CM | POA: Diagnosis not present

## 2019-05-03 DIAGNOSIS — R490 Dysphonia: Secondary | ICD-10-CM | POA: Diagnosis not present

## 2019-05-04 ENCOUNTER — Encounter: Payer: Self-pay | Admitting: Internal Medicine

## 2019-06-07 ENCOUNTER — Encounter: Payer: Self-pay | Admitting: Internal Medicine

## 2019-06-07 ENCOUNTER — Ambulatory Visit (INDEPENDENT_AMBULATORY_CARE_PROVIDER_SITE_OTHER): Payer: Medicare Other | Admitting: Internal Medicine

## 2019-06-07 ENCOUNTER — Other Ambulatory Visit: Payer: Self-pay

## 2019-06-07 VITALS — BP 122/74 | HR 87 | Temp 98.6°F | Ht 65.5 in | Wt 200.6 lb

## 2019-06-07 DIAGNOSIS — E2839 Other primary ovarian failure: Secondary | ICD-10-CM

## 2019-06-07 DIAGNOSIS — I129 Hypertensive chronic kidney disease with stage 1 through stage 4 chronic kidney disease, or unspecified chronic kidney disease: Secondary | ICD-10-CM | POA: Diagnosis not present

## 2019-06-07 DIAGNOSIS — N182 Chronic kidney disease, stage 2 (mild): Secondary | ICD-10-CM | POA: Diagnosis not present

## 2019-06-07 DIAGNOSIS — Z23 Encounter for immunization: Secondary | ICD-10-CM | POA: Diagnosis not present

## 2019-06-07 DIAGNOSIS — E1122 Type 2 diabetes mellitus with diabetic chronic kidney disease: Secondary | ICD-10-CM | POA: Diagnosis not present

## 2019-06-07 DIAGNOSIS — G8929 Other chronic pain: Secondary | ICD-10-CM

## 2019-06-07 DIAGNOSIS — M545 Low back pain: Secondary | ICD-10-CM

## 2019-06-07 MED ORDER — TETANUS-DIPHTH-ACELL PERTUSSIS 5-2.5-18.5 LF-MCG/0.5 IM SUSP: 0.5000 mL | Freq: Once | INTRAMUSCULAR | 0 refills | Status: AC

## 2019-06-07 NOTE — Patient Instructions (Signed)

## 2019-06-08 LAB — BMP8+EGFR
BUN/Creatinine Ratio: 6 — ABNORMAL LOW (ref 12–28)
BUN: 5 mg/dL — ABNORMAL LOW (ref 8–27)
CO2: 28 mmol/L (ref 20–29)
Calcium: 10.2 mg/dL (ref 8.7–10.3)
Chloride: 98 mmol/L (ref 96–106)
Creatinine, Ser: 0.88 mg/dL (ref 0.57–1.00)
GFR calc Af Amer: 76 mL/min/{1.73_m2} (ref >59)
GFR calc non Af Amer: 66 mL/min/{1.73_m2} (ref >59)
Glucose: 79 mg/dL (ref 65–99)
Potassium: 4.5 mmol/L (ref 3.5–5.2)
Sodium: 142 mmol/L (ref 134–144)

## 2019-06-08 LAB — HEMOGLOBIN A1C
Est. average glucose Bld gHb Est-mCnc: 154 mg/dL
Hgb A1c MFr Bld: 7 % — ABNORMAL HIGH (ref 4.8–5.6)

## 2019-06-08 NOTE — Progress Notes (Signed)
Subjective:     Patient ID: [name] , female    DOB: [birth year] , 73 y.o.   MRN: [id number]   Chief Complaint  Patient presents with  . Diabetes  . Hypertension    HPI  Diabetes  She presents for her follow-up diabetic visit. She has type 2 diabetes mellitus. Her disease course has been stable. There are no hypoglycemic associated symptoms. Pertinent negatives for diabetes include no blurred vision and no chest pain. There are no hypoglycemic complications. Risk factors for coronary artery disease include diabetes mellitus, dyslipidemia, hypertension, obesity, sedentary lifestyle and post-menopausal. She is following a diabetic diet. She participates in exercise intermittently. Her breakfast blood glucose is taken between 8-9 am. Her breakfast blood glucose range is generally 110-130 mg/dl. An ACE inhibitor/angiotensin II receptor blocker is contraindicated. Eye exam is current.  Hypertension  This is a chronic problem. The current episode started more than 1 year ago. The problem has been gradually improving since onset. The problem is controlled. Pertinent negatives include no blurred vision, chest pain, palpitations or shortness of breath. Past treatments include ACE inhibitors, angiotensin blockers and diuretics. The current treatment provides moderate improvement. Compliance problems include exercise.      Past Medical History:  Diagnosis Date  . COPD (chronic obstructive pulmonary disease) ([place])   . Diabetes ([place])   . High cholesterol   . Hypertension   . Pneumonia 09/10/2017     Family History  Problem Relation Age of Onset  . Heart murmur Mother   . Heart attack Father   . Diabetes Father   . Diabetes Brother      Current Outpatient Medications:  .  albuterol (PROAIR HFA) 108 (90 Base) MCG/ACT inhaler, Inhale 2 puffs into the lungs every 4 (four) hours as needed for wheezing or shortness of breath., Disp: 1 Inhaler, Rfl: 11 .  aspirin 81 MG tablet, Take 81 mg  by mouth daily. , Disp: , Rfl:  .  Biotin 10000 MCG TBDP, Take by mouth., Disp: , Rfl:  .  bisoprolol (ZEBETA) 5 MG tablet, TAKE 1/2 A TABLET BY MOUTH EVERY DAY, Disp: 45 tablet, Rfl: 0 .  BLACK CURRANT SEED OIL PO, Take by mouth., Disp: , Rfl:  .  budesonide-formoterol (SYMBICORT) 80-4.5 MCG/ACT inhaler, Inhale 2 puffs into the lungs 2 (two) times daily., Disp: 1 Inhaler, Rfl: 0 .  CALCIUM-MAGNESIUM-ZINC PO, Take 1 capsule by mouth daily., Disp: , Rfl:  .  chlorpheniramine (CHLOR-TRIMETON) 4 MG tablet, Take 4 mg by mouth 2 (two) times daily as needed for allergies., Disp: , Rfl:  .  Cholecalciferol (VITAMIN D3) 2000 UNITS TABS, Take 2,000 Units by mouth daily., Disp: , Rfl:  .  famotidine (PEPCID) 20 MG tablet, TAKE 1 TABLET BY MOUTH AT BEDTIME, Disp: 90 tablet, Rfl: 3 .  gabapentin (NEURONTIN) 300 MG capsule, Take 1 capsule (300 mg total) by mouth 4 (four) times daily., Disp: 120 capsule, Rfl: 2 .  ipratropium-albuterol (DUONEB) 0.5-2.5 (3) MG/3ML SOLN, Take 3 mLs by nebulization every 6 (six) hours as needed. DX: J44.9, Disp: 120 mL, Rfl: 2 .  lidocaine (LIDODERM) 5 %, Place 1 patch onto the skin daily. Remove & Discard patch within 12 hours or as directed by MD, Disp: 30 patch, Rfl: 1 .  montelukast (SINGULAIR) 10 MG tablet, TAKE 1 TABLET(10 MG) BY MOUTH AT BEDTIME, Disp: 30 tablet, Rfl: 2 .  oxyCODONE (ROXICODONE) 5 MG immediate release tablet, Take 1 tablet (5 mg total) by mouth every 4 (four)  hours as needed (as needed for severe chronic cough). prn for severe chronic cough, Disp: 40 tablet, Rfl: 0 .  OZEMPIC, 0.25 OR 0.5 MG/DOSE, 2 MG/1.5ML SOPN, INJECT 0.5 MG UNDER THE SKIN EVERY WEEKLY., Disp: 1.5 mL, Rfl: 1 .  pantoprazole (PROTONIX) 40 MG tablet, TAKE 1 TABLET BY MOUTH EVERY DAY 30 TO 60 MINUTES BEFORE FIRST MEAL OF THE DAY, Disp: 30 tablet, Rfl: 2 .  rosuvastatin (CRESTOR) 20 MG tablet, TAKE 1 TABLET BY MOUTH AT BEDTIME, Disp: 90 tablet, Rfl: 0 .  ACCU-CHEK FASTCLIX LANCETS MISC, Inject  1 each as directed daily. Use as directed to check blood sugars daily dx: e11.22 (Patient not taking: Reported on 06/07/2019), Disp: 50 each, Rfl: 11 .  glucose blood (ACCU-CHEK GUIDE) test strip, 1 each by Other route daily. Use as instructed to check blood sugars 1 time per day dx: e11.22 (Patient not taking: Reported on 06/07/2019), Disp: 50 each, Rfl: 11 .  hydrochlorothiazide (HYDRODIURIL) 25 MG tablet, , Disp: , Rfl:    Allergies  Allergen Reactions  . Other Itching    HAIR DYE     Review of Systems  Constitutional: Negative.   Eyes: Negative for blurred vision.  Respiratory: Negative.  Negative for shortness of breath.   Cardiovascular: Negative.  Negative for chest pain and palpitations.  Gastrointestinal: Negative.   Musculoskeletal: Positive for back pain.  Neurological: Negative.   Psychiatric/Behavioral: Negative.      Today's Vitals   06/07/19 1203  BP: 122/74  Pulse: 87  Temp: 98.6 F (37 C)  TempSrc: Oral  Weight: 200 lb 9.6 oz (91 kg)  Height: 5' 5.5" (1.664 m)  PainSc: 0-No pain   Body mass index is 32.87 kg/m.   Objective:  Physical Exam Vitals signs and nursing note reviewed.  Constitutional:      Appearance: Normal appearance.  HENT:     Head: Normocephalic and atraumatic.  Cardiovascular:     Rate and Rhythm: Normal rate and regular rhythm.     Heart sounds: Normal heart sounds.  Pulmonary:     Effort: Pulmonary effort is normal.     Breath sounds: Normal breath sounds.  Skin:    General: Skin is warm.  Neurological:     General: No focal deficit present.     Mental Status: She is alert.  Psychiatric:        Mood and Affect: Mood normal.        Behavior: Behavior normal.         Assessment And Plan:     1. Diabetes mellitus with stage 2 chronic kidney disease ([place])   I will check labs as listed below. Importance of regular exercise was discussed with the patient. She is encouraged to avoid sugary beverages and processed foods.  -  Hemoglobin A1c - BMP8+EGFR  2. Parenchymal renal hypertension, stage 1 through stage 4 or unspecified chronic kidney disease Well controlled. She will continue with current meds. She is encouraged to avoid adding salt to her foods.   3. Estrogen deficiency  She is encouraged to engage in weight-bearing activity at least 3 days weekly.  Calcium and vitamin D supplementation are also important to help maintain bone density. I will refer her to [facility] for bone density evaluation.   - DG Bone Density; Future  4. Chronic bilateral low back pain without sciatica  She was given some back stretches to perform daily.   5. Need for vaccination   Rx Boostrix was sent to the pharmacy.  [name], MD    THE PATIENT IS ENCOURAGED TO PRACTICE SOCIAL DISTANCING DUE TO THE COVID-19 PANDEMIC.

## 2019-06-19 ENCOUNTER — Other Ambulatory Visit: Payer: Self-pay | Admitting: Internal Medicine

## 2019-06-21 ENCOUNTER — Other Ambulatory Visit: Payer: Self-pay | Admitting: Internal Medicine

## 2019-06-21 MED ORDER — MONTELUKAST SODIUM 10 MG PO TABS: ORAL_TABLET | ORAL | 11 refills | Status: DC

## 2019-06-22 ENCOUNTER — Other Ambulatory Visit: Payer: Self-pay

## 2019-06-22 ENCOUNTER — Encounter: Payer: Self-pay | Admitting: Internal Medicine

## 2019-06-22 MED ORDER — OZEMPIC (0.25 OR 0.5 MG/DOSE) 2 MG/1.5ML ~~LOC~~ SOPN: 0.5000 mg | PEN_INJECTOR | SUBCUTANEOUS | 1 refills | Status: DC

## 2019-06-26 ENCOUNTER — Other Ambulatory Visit: Payer: Self-pay | Admitting: Nurse Practitioner

## 2019-06-28 ENCOUNTER — Other Ambulatory Visit: Payer: Self-pay | Admitting: Internal Medicine

## 2019-06-28 MED ORDER — PANTOPRAZOLE SODIUM 40 MG PO TBEC: DELAYED_RELEASE_TABLET | ORAL | 0 refills | Status: DC

## 2019-07-01 ENCOUNTER — Encounter: Payer: Self-pay | Admitting: Internal Medicine

## 2019-07-01 ENCOUNTER — Other Ambulatory Visit: Payer: Self-pay

## 2019-07-01 ENCOUNTER — Ambulatory Visit (INDEPENDENT_AMBULATORY_CARE_PROVIDER_SITE_OTHER): Payer: Medicare Other | Admitting: Internal Medicine

## 2019-07-01 ENCOUNTER — Telehealth: Payer: Self-pay | Admitting: *Deleted

## 2019-07-01 ENCOUNTER — Telehealth: Payer: Self-pay

## 2019-07-01 DIAGNOSIS — Z20822 Contact with and (suspected) exposure to covid-19: Secondary | ICD-10-CM

## 2019-07-01 DIAGNOSIS — J449 Chronic obstructive pulmonary disease, unspecified: Secondary | ICD-10-CM | POA: Diagnosis not present

## 2019-07-01 DIAGNOSIS — Z20828 Contact with and (suspected) exposure to other viral communicable diseases: Secondary | ICD-10-CM

## 2019-07-01 NOTE — Telephone Encounter (Signed)
She should self quarantine x14 days from last exp to daughter but if her daughter is proven neg then ok to stop isolation  No need for testing

## 2019-07-01 NOTE — Telephone Encounter (Signed)
Pt scheduled for covid testing on 07/05/19 @ [facility] @ 10:00. Instructions given and order placed

## 2019-07-01 NOTE — Telephone Encounter (Signed)
Routed to Dr. [name] for review and recommendations. Patient sent email:  'My daughter who is a med tech at assisted living facility was informed yesterday that one of their residents tested positive for corona virus. Since my daughter lives with me and due to my health issues , should I get tested?  All the workers will be tested today and they will be self quarantined. My daughter will be living in hotel for next 2 weeks.'  Patient diagnosis:  COPD  Dr. [name] please advise.  Thank you

## 2019-07-01 NOTE — Telephone Encounter (Signed)
Hello,  Dr. Baird Cancer would like the patient to be contacted and scheduled for a coronavirus test.

## 2019-07-03 NOTE — Progress Notes (Signed)
Virtual Visit via Video   This visit type was conducted due to national recommendations for restrictions regarding the COVID-19 Pandemic (e.g. social distancing) in an effort to limit this patient's exposure and mitigate transmission in our community.  Due to her co-morbid illnesses, this patient is at least at moderate risk for complications without adequate follow up.  This format is felt to be most appropriate for this patient at this time.  All issues noted in this document were discussed and addressed.  A limited physical exam was performed with this format.    This visit type was conducted due to national recommendations for restrictions regarding the COVID-19 Pandemic (e.g. social distancing) in an effort to limit this patient's exposure and mitigate transmission in our community.  Patients identity confirmed using two different identifiers.  This format is felt to be most appropriate for this patient at this time.  All issues noted in this document were discussed and addressed.  No physical exam was performed (except for noted visual exam findings with Video Visits).    Date:  07/03/2019   ID:  [name], DOB 08/04/1946, MRN [id number]  Patient Location:  Home  Provider location:   Office    Chief Complaint:  "I need to be tested for COVID"  History of Present Illness:    [name] is a 73 y.o. female who presents via video conferencing for a telehealth visit today.    The patient does not have symptoms concerning for COVID-19 infection (fever, chills, cough, or new shortness of breath).   She presents today for virtual visit. She prefers this method of contact due to COVID-19 pandemic. She called today, wanting virtual visit, to discuss getting testing. She reports her daughter, who lives with her and works at [employer], was exposed to the virus. She worked with a patient who has recently been diagnosed. Her daughter is not experiencing any symptoms.  Her husband has paid for her daughter to live in a hotel for the next five days. She is concerned b/c of her underlying COPD. She is not experiencing any symptoms at this time. She is concerned b/c she is considered high-risk.     Past Medical History:  Diagnosis Date  . COPD (chronic obstructive pulmonary disease) ([place])   . Diabetes ([place])   . High cholesterol   . Hypertension   . Pneumonia 09/10/2017   Past Surgical History:  Procedure Laterality Date  . ABDOMINAL HYSTERECTOMY  1995  . carpel tunnel surgery  2006  . COLONOSCOPY  2014  . COLOSTOMY  2007  . colostomy let down  2008  . endoscopy  2014     Current Meds  Medication Sig  . ACCU-CHEK FASTCLIX LANCETS MISC Inject 1 each as directed daily. Use as directed to check blood sugars daily dx: e11.22  . albuterol (PROAIR HFA) 108 (90 Base) MCG/ACT inhaler Inhale 2 puffs into the lungs every 4 (four) hours as needed for wheezing or shortness of breath.  Marland Kitchen aspirin 81 MG tablet Take 81 mg by mouth daily.   . Biotin 10000 MCG TBDP Take by mouth.  . bisoprolol (ZEBETA) 5 MG tablet TAKE 1/2 A TABLET BY MOUTH EVERY DAY  . BLACK CURRANT SEED OIL PO Take by mouth.  . budesonide-formoterol (SYMBICORT) 80-4.5 MCG/ACT inhaler Inhale 2 puffs into the lungs 2 (two) times daily.     Allergies:   Other   Social History   Tobacco Use  . Smoking status: Former Smoker  Packs/day: 0.25    Years: 42.00    Pack years: 10.50    Types: Cigarettes    Quit date: 01/30/2006    Years since quitting: 13.4  . Smokeless tobacco: Never Used  Substance Use Topics  . Alcohol use: No  . Drug use: No     Family Hx: The patient's family history includes Diabetes in her brother and father; Heart attack in her father; Heart murmur in her mother.  ROS:   Please see the history of present illness.    Review of Systems  Constitutional: Negative.   Respiratory: Negative.   Cardiovascular: Negative.   Gastrointestinal: Negative.   Neurological:  Negative.   Psychiatric/Behavioral: Negative.     All other systems reviewed and are negative.   Labs/Other Tests and Data Reviewed:    Recent Labs: 07/29/2018: TSH 0.88 10/06/2018: ALT 11 06/07/2019: BUN 5; Creatinine, Ser 0.88; Potassium 4.5; Sodium 142   Recent Lipid Panel Lab Results  Component Value Date/Time   CHOL 182 02/03/2019 03:39 PM   TRIG 121 02/03/2019 03:39 PM   HDL 57 02/03/2019 03:39 PM   CHOLHDL 3.2 02/03/2019 03:39 PM   LDLCALC 101 (H) 02/03/2019 03:39 PM    Wt Readings from Last 3 Encounters:  06/07/19 200 lb 9.6 oz (91 kg)  04/16/19 193 lb 6.4 oz (87.7 kg)  02/03/19 192 lb 3.2 oz (87.2 kg)     Exam:    Vital Signs:  There were no vitals taken for this visit.    Physical Exam  Constitutional: She is oriented to person, place, and time and well-developed, well-nourished, and in no distress.  HENT:  Head: Normocephalic and atraumatic.  Neck: Normal range of motion.  Pulmonary/Chest: Effort normal.  Neurological: She is alert and oriented to person, place, and time.  Psychiatric: Affect normal.  Nursing note and vitals reviewed.   ASSESSMENT & PLAN:     1. Exposure to Covid-19 Virus  Pt advised she will be referred to [facility] to schedule testing. She was advised to schedule no earlier than next Monday. She agrees to check her temperature daily. She is also to email me daily with her symptoms, if any develop. She is also encouraged to go to ER should she develop worsening shortness of breath. She is aware that she and her husband should self-quarantine until her test results come back. All questions were answered to her satisfaction.   - Temperature monitoring; Future  2. Chronic obstructive pulmonary disease, unspecified COPD type ([facility])  Chronic, currently stable.     COVID-19 Education: The signs and symptoms of COVID-19 were discussed with the patient and how to seek care for testing (follow up with PCP or arrange E-visit).  The importance of  social distancing was discussed today.  Patient Risk:   After full review of this patients clinical status, I feel that they are at least moderate risk at this time.  Time:   Today, I have spent 7 minutes/ 22 seconds with the patient with telehealth technology discussing above diagnoses.     Medication Adjustments/Labs and Tests Ordered: Current medicines are reviewed at length with the patient today.  Concerns regarding medicines are outlined above.   Tests Ordered: No orders of the defined types were placed in this encounter.   Medication Changes: No orders of the defined types were placed in this encounter.   Disposition:  Follow up prn  Signed, [name], MD

## 2019-07-05 ENCOUNTER — Other Ambulatory Visit: Payer: Self-pay

## 2019-07-05 DIAGNOSIS — Z20822 Contact with and (suspected) exposure to covid-19: Secondary | ICD-10-CM

## 2019-07-05 DIAGNOSIS — R6889 Other general symptoms and signs: Secondary | ICD-10-CM | POA: Diagnosis not present

## 2019-07-10 LAB — NOVEL CORONAVIRUS, NAA: SARS-CoV-2, NAA: NOT DETECTED

## 2019-07-19 ENCOUNTER — Telehealth (INDEPENDENT_AMBULATORY_CARE_PROVIDER_SITE_OTHER): Payer: Medicare Other | Admitting: Internal Medicine

## 2019-07-19 ENCOUNTER — Ambulatory Visit: Payer: Medicare Other | Admitting: Internal Medicine

## 2019-07-19 DIAGNOSIS — R05 Cough: Secondary | ICD-10-CM | POA: Diagnosis not present

## 2019-07-19 DIAGNOSIS — J449 Chronic obstructive pulmonary disease, unspecified: Secondary | ICD-10-CM | POA: Diagnosis not present

## 2019-07-19 DIAGNOSIS — R059 Cough, unspecified: Secondary | ICD-10-CM

## 2019-07-19 NOTE — Patient Instructions (Signed)
No change in medications for now   Please schedule a follow up visit in 6 months but call sooner if needed   Pt has MyChart

## 2019-07-19 NOTE — Progress Notes (Signed)
Subjective:   Patient ID: [name], female    DOB: 1946-04-11    MRN: [id number]    Brief patient profile:  16  yobf mother of a PA in [employer] quit smoking 2007 with chronic cough x 2011 self referred for evaluation of persistent cough in setting of technically GOLD II copd dx 02/2014 with only mild obstructive pattern - has noted since high school a tendency to daily  tickle in throat causing intermittent cough waxing and waning ever since    History of Present Illness  10/19/2014 1st [facility] office visit/ [facility]   Chief Complaint  Patient presents with  . Pulmonary Consult    Former pt of Dr. Annamaria Boots. Pt c/o increased cough x 3 wks. Cough is prod with moderate, thick, light yellow sputum.  She coughs until loses urinary continence.  Not sleeping well due to cough.   remembers having tendency to throat tickle in high school  but completely resolved by graduation  then recurred in her 52s and resolved with codeine then recurred in 2011  but bad to worse x 3 weeks assoc with sensation of choking, mucus is thick light yellow.  acei stopped about a month prior to OV  Replaced by losartan  Inhalers make it worse, esp dpi  Kouffman Reflux v Neurogenic Cough Differentiator Reflux Comments  Do you awaken from a sound sleep coughing violently?                            With trouble breathing? Yes   Do you have choking episodes when you cannot  Get enough air, gasping for air ?              Yes   Do you usually cough when you lie down into  The bed, or when you just lie down to rest ?                          Yes   Do you usually cough after meals or eating?         maybe   Do you cough when (or after) you bend over?    No    GERD SCORE     Kouffman Reflux v Neurogenic Cough Differentiator Neurogenic   Do you more-or-less cough all day long? yes   Does change of temperature make you cough? no   Does laughing or chuckling cause you to cough? no   Do fumes (perfume, automobile fumes,  burned  Toast, etc.,) cause you to cough ?      Not aware   Does speaking, singing, or talking on the phone cause you to cough   ?               sometimes   Neurogenic/Airway score      rec  Max rx gerd/prn tramadol        09/15/2017  Transition of care  f/u ov/[facility] re: COPD II s/p aecopd Chief Complaint  Patient presents with  . Hospitalization Follow-up    Breathing has improved some, but not back at her normal baseline. She states her cough woke her up this am and had some bloody nasal d/c.  She has been coughing up some thick, yellow sputum.  She is using   walking all over [place] fine,  Flew back to [place]  on a commercial jet and short of breath  walking  from gate [place] then by 2 days later in ER due to severe sob / tightness across upper back >> albuterol relived the discomfort w/in 5 min but did not have her rescue so had to call 911 (proair listed on med calendar she just got from me 08/19/17 under written action plan) - says was so short of breath at that point could not have used saba anyway. Not following action plan either re controlling cough which is also worse/ has not started meds from discharge  rec Plan A = Automatic = symbicort 80 Take 2 puffs first thing in am and then another 2 puffs about 12 hours later.  Work on [employer] and spatial [employer]: Plan B = Backup Only use your albuterol as a rescue medication       03/23/2018 acute extended  ov/[facility] re:   Copd  - extended ov re new noct cough /wheeze and need for saba / no med calendar Chief Complaint  Patient presents with  . Acute Visit    Breathing is overall doing well. She uses her albuterol inhaler 4 x per wk on average. She has not needed her neb.  Mid February breathing /coughing  worse / wheezing every noct needs albuterol every noct Very confused with instructions/ now has neb but did not disclose med she uses at the med calendar ov and did not bring the med calendar with her nor the med for the  nebulizer and easily frustrated re concept of med reconciliation  Daytime sob = MMRC2 = can't walk a nl pace on a flat grade s sob but does fine slow and flat  rec Plan A = Automatic = symbicort 160 Take 2 puffs first thing in am and then another 2 puffs about 12 hours later.  Plan B = Backup Only use your albuterol as a rescue medication  Plan C = Crisis - only use your albuterol nebulizer if you first try Plan B and it fails to help > ok to use the nebulizer up to every 4 hours but if start needing it regularly call for immediate appointment     04/20/2018  f/u ov/[facility] re:   Copd gold II / brought med calendar but not clear using consistently / chronic cough/ hbp Chief Complaint  Patient presents with  . Follow-up    COPD , feel good no problems at this time  Dyspnea:  Dances   2- 3 songs / shopping is ok  Cough: better, mostly dry/ throat clearing / noct  Tessalon maybe once every couple days  "it says to take it 3 x daily"  Sleep: better  while maint on symbicort 160 2bid Sleeping fine on 1 pillow     Symbicort 80 or 160 should be  Take 2 puffs first thing in am and then another 2 puffs about 12 hours later- you can pick which one you prefer but if  they are the same then use the lower strength Please schedule a follow up visit in 3 months but call sooner if needed     07/22/2018  f/u ov/[facility] re:   GOLD II copd / has calendar not using the action plans at bottom/ since previous ov stopped lyrica and took gabapentin 100 tid x 3 weeks then stopped (was supposed to stay on until ov) Chief Complaint  Patient presents with  . Follow-up    Cough has improved slightly but "still driving me crazy"- prod with clear sputum.  She uses her proair 2  x per wk and rarely uses neb.   Dyspnea:   MMRC2 = can't walk a nl pace on a flat grade s sob but does fine slow and flat  Cough  Tickle/ gag anytime day except doesn't wake her up goes back to childhood SABA use: twice daily on bad days but  recently less / hardly ever uses neb  Says only thing that stops her cough is oxycodone rec Take delsym two tsp every 12 hours and supplement if needed with  Oxycodone  up to 1 every 4 hours to suppress the urge to cough.  Once you have eliminated the cough for 3 straight days try reducing the oxycodone first,  then the delsym as tolerated.   For drainage / throat tickle try take CHLORPHENIRAMINE  4 mg - take one every 4 hours as needed   Reduce symbicort 80 Take 2 puffs first thing in am and then another 2 puffs about 12 hours later. > did not continue consistently "you didn't make this clear"  Gabapentin 300 mg four times a day - if not doing better we need to refer you to [facility] / Dr Joya Gaskins > did not do  See Tammy NP in 6  weeks with all your medications>  Did not do    01/07/2019  Acute extended  ov/[facility] re:  Flare of wheeze off symbicort, cough never resolved / no med calendar/ poor insight into meds/ action plans  Chief Complaint  Patient presents with  . Acute Visit    pt c/o wheezing, prod cough with gray/white mucus, pnd, stuffy nose.   Dyspnea:  MMRC3 = can't walk 100 yards even at a slow pace at a flat grade s stopping due to sob   Cough: severe fits of coughing day and noc  Sleeping: poorly due to cough and wheeze SABA use: qid instead of symbicort  rec Plan A = Automatic = Symbicort 80 Take 2 puffs first thing in am and then another 2 puffs about 12 hours later.  Plan B = Backup (add this to Plan A, not in place of it)  Only use your albuterol inhaler as a rescue medication Plan C = Crisis - only use your albuterol nebulizer if you first try Plan B For drainage / throat tickle try take CHLORPHENIRAMINE  4 mg(Chlortab at [facility]) -  Take delsym two tsp every 12 hours and supplement if needed with  vicodine up to 2 every 4 hours to suppress the urge to cough. Swallowing water and/or using ice chips/non mint and menthol containing candies (such as lifesavers or sugarless  jolly ranchers) are also effective.  You should rest your voice and avoid activities that you know make you cough. Once you have eliminated the cough for 3 straight days try reducing the vicodin first,  then the delsym as tolerated.   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 We discussed treatment options including observation, continuing PPI therapy, an evaluation in the voice lab to assess her candidacy for voice therapy and respiratory retraining, or a trial of tramadol.    04/16/2019 acute  extended ov/ [facility] re recurrent cough on gabapentin 300 tid / did not fill tramadol "I told Dr Joya Gaskins it wouldn't work" "only better with oxycodone"  Chief Complaint  Patient presents with  . Acute Visit    Increased cough x 3 wks- prod with white sputum. She has had some wheezing also. She is using her albuterol inhaler at least once per day and rarely uses neb.  Dyspnea:  Minimal  Cough: worse tickle/ no h1 on hand says plans to pick it up  Sleeping: fine 2 pillows / not typically waking her up  SABA use: rarely  02: none  Doesn't think delsym helped/ no h1 on hand / using gabapentin 300 mg tid   gen chest discomfort with severe coughing fits  rec Prednisone 10 mg take  4 each am x 2 days,   2 each am x 2 days,  1 each am x 2 days and stop   Take delsym two tsp every 12 hours and supplement if needed with oxidocone 5 mg up to 2 every 4 hours to suppress the urge to cough.  Once you have eliminated the cough for 3 straight days try reducing the oxycodone first,  then the delsym as tolerated.   Keep your appts with speech therapy  Please schedule a follow up visit in 3 months  but call sooner if needed  with all medications /inhalers/ solutions in hand so we can verify exactly what you are taking. This includes all medications from all doctors and over the counters     07/19/2019  Virtual f/u ov/[facility] re: GOLD II COPD/uacs Dyspnea:  A lot of walking inside walking 15-20 min with some hills Cough: none, no cough meds  Sleeping: no problem / 2 pillows  SABA use: rare 02: none    No obvious day to day or daytime variability or assoc excess/ purulent sputum or mucus plugs or hemoptysis or cp or chest tightness, subjective wheeze or overt sinus or hb symptoms.   sleepoing without nocturnal  or early am exacerbation  of respiratory  c/o's or need for noct saba. Also denies any obvious fluctuation of symptoms with weather or environmental changes or other aggravating or alleviating factors except as outlined above   No unusual exposure hx or h/o childhood pna/ asthma or knowledge of premature birth.  Current Allergies, Complete Past Medical History, Past Surgical History, Family History, and Social History were reviewed in [employer] record.  ROS  The following are not active complaints unless bolded Hoarseness, sore throat, dysphagia, dental problems, itching, sneezing,  nasal congestion or discharge of excess mucus or purulent secretions, ear ache,   fever, chills, sweats, unintended wt loss or wt gain, classically pleuritic or exertional cp,  orthopnea pnd or arm/hand swelling  or leg swelling, presyncope, palpitations, abdominal pain, anorexia, nausea, vomiting, diarrhea  or change in bowel habits or change in bladder habits, change in stools or change in urine, dysuria, hematuria,  rash, arthralgias, visual complaints, headache, numbness, weakness or ataxia or problems with walking or coordination,  change in mood or  memory.        Current Meds  Medication Sig  . ACCU-CHEK FASTCLIX LANCETS MISC Inject 1 each as directed daily. Use as  directed to check blood sugars daily dx: e11.22  . albuterol (PROAIR HFA) 108 (90 Base) MCG/ACT inhaler Inhale 2 puffs into the lungs every 4 (four) hours as needed for wheezing or shortness of breath.  Marland Kitchen aspirin 81 MG tablet  Take 81 mg by mouth daily.   . Biotin 10000 MCG TBDP Take by mouth.  . bisoprolol (ZEBETA) 5 MG tablet TAKE 1/2 A TABLET BY MOUTH EVERY DAY  . BLACK CURRANT SEED OIL PO Take by mouth.  . budesonide-formoterol (SYMBICORT) 80-4.5 MCG/ACT inhaler Inhale 2 puffs into the lungs 2 (two) times daily.  Marland Kitchen CALCIUM-MAGNESIUM-ZINC PO Take 1 capsule by mouth daily.  . chlorpheniramine (CHLOR-TRIMETON) 4 MG tablet Take 4 mg by mouth 2 (two) times daily as needed for allergies.  . Cholecalciferol (VITAMIN D3) 2000 UNITS TABS Take 2,000 Units by mouth daily.  . famotidine (PEPCID) 20 MG tablet TAKE 1 TABLET BY MOUTH AT BEDTIME  . gabapentin (NEURONTIN) 300 MG capsule Take 1 capsule (300 mg total) by mouth 4 (four) times daily.  Marland Kitchen glucose blood (ACCU-CHEK GUIDE) test strip 1 each by Other route daily. Use as instructed to check blood sugars 1 time per day dx: e11.22  . hydrochlorothiazide (HYDRODIURIL) 25 MG tablet TAKE 1 TABLET BY MOUTH EVERY DAY  . ipratropium-albuterol (DUONEB) 0.5-2.5 (3) MG/3ML SOLN Take 3 mLs by nebulization every 6 (six) hours as needed. DX: J44.9  . lidocaine (LIDODERM) 5 % Place 1 patch onto the skin daily. Remove & Discard patch within 12 hours or as directed by MD  . Magnesium 250 MG TABS Take 1 tablet by mouth daily.  . montelukast (SINGULAIR) 10 MG tablet TAKE 1 TABLET(10 MG) BY MOUTH AT BEDTIME  . oxyCODONE (ROXICODONE) 5 MG immediate release tablet Take 1 tablet (5 mg total) by mouth every 4 (four) hours as needed (as needed for severe chronic cough). prn for severe chronic cough  . pantoprazole (PROTONIX) 40 MG tablet TAKE 1 TABLET BY MOUTH EVERY DAY 30 TO 60 MINUTES BEFORE FIRST MEAL OF THE DAY  . rosuvastatin (CRESTOR) 20 MG tablet TAKE 1 TABLET BY MOUTH AT  BEDTIME  . Semaglutide,0.25 or 0.5MG /DOS, (OZEMPIC, 0.25 OR 0.5 MG/DOSE,) 2 MG/1.5ML SOPN Inject 0.5 mg into the skin once a week.          Pex Virtual visit - looks great on screen good voice, no cough or wob.

## 2019-07-20 NOTE — Assessment & Plan Note (Signed)
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 - try off cozar 11/03/2014 >>> - neurontin 100 tid rx 11/30/14 > improved 01/03/2015 > changed to lyrica 04/2015 but not taking consistently as of 01/30/2016  - sinus CT 02/17/15 > neg  - FENO  04/14/17 =  14  - rechallenged with gabapentin 300 qid 07/22/2018 and if not better > refer to Dr Joya Gaskins at [facility] voice center > did not go, referred again 01/07/2019 > see eval 03/02/2019 rec speech therapy/tramadol (pt refused latter)     Much better for the first time in years, no change in recs at this point and advised to keep all f/u visits  Each maintenance medication was reviewed in detail including most importantly the difference between maintenance and as needed and under what circumstances the prns are to be used.  Please see AVS for specific  Instructions which are unique to this visit and I personally typed out  which were reviewed in detail over the phone  with the patient and a copy provided via my chart

## 2019-07-20 NOTE — Assessment & Plan Note (Signed)
Quit smoking 2007 PFT: 03/17/2014-  FEV1  1.28 (58%) ratio 66 and no better p saba, ERV 27 and DLCO 56 corrects to 77 - 11/30/2014   try dulera 100 2bid > 01/03/2015 not clear helping,  use prn > flare since mid 02/2015 cough > sob  - 04/07/2015 p extensive coaching HFA effectiveness =    90% > try dulera 100 2bid > improved 04/21/15   - 06/01/2015   try dulera 200 2bid  > did not maintain  - Spirometry 04/14/2017  FEV1 1.13 (58%)  Ratio 52 p saba w/in 4 h > add symb 80 2bid   - 08/19/2017    resumed sym 80 2bid -  03/23/2018    try symb 160 due to new noct cough/ wheeze > improved 04/20/2018  - 07/22/2018   try symb 80 2bid due to UACS - [facility] rec add spiriva smi > pt declined    Adequate control on present rx, reviewed in detail with pt > no change in rx needed  = low dose symbicort only so as to not aggravate UACS

## 2019-07-21 ENCOUNTER — Encounter: Payer: Self-pay | Admitting: Internal Medicine

## 2019-07-23 ENCOUNTER — Encounter: Payer: Self-pay | Admitting: Internal Medicine

## 2019-07-24 ENCOUNTER — Other Ambulatory Visit: Payer: Self-pay | Admitting: Internal Medicine

## 2019-07-26 ENCOUNTER — Encounter: Payer: Self-pay | Admitting: Internal Medicine

## 2019-07-26 ENCOUNTER — Other Ambulatory Visit: Payer: Self-pay | Admitting: Internal Medicine

## 2019-07-26 MED ORDER — DULOXETINE HCL 30 MG PO CPEP: 30.0000 mg | ORAL_CAPSULE | Freq: Every day | ORAL | 2 refills | Status: DC

## 2019-08-01 ENCOUNTER — Other Ambulatory Visit: Payer: Self-pay | Admitting: Internal Medicine

## 2019-08-02 ENCOUNTER — Other Ambulatory Visit: Payer: Self-pay | Admitting: Internal Medicine

## 2019-08-02 MED ORDER — PANTOPRAZOLE SODIUM 40 MG PO TBEC: DELAYED_RELEASE_TABLET | ORAL | 2 refills | Status: DC

## 2019-08-30 ENCOUNTER — Encounter: Payer: Self-pay | Admitting: Internal Medicine

## 2019-09-01 ENCOUNTER — Ambulatory Visit (INDEPENDENT_AMBULATORY_CARE_PROVIDER_SITE_OTHER): Payer: Medicare Other

## 2019-09-01 ENCOUNTER — Ambulatory Visit (INDEPENDENT_AMBULATORY_CARE_PROVIDER_SITE_OTHER): Payer: Medicare Other | Admitting: Internal Medicine

## 2019-09-01 ENCOUNTER — Other Ambulatory Visit: Payer: Self-pay

## 2019-09-01 ENCOUNTER — Encounter: Payer: Self-pay | Admitting: Internal Medicine

## 2019-09-01 ENCOUNTER — Telehealth: Payer: Medicare Other | Admitting: Pharmacist

## 2019-09-01 ENCOUNTER — Ambulatory Visit (INDEPENDENT_AMBULATORY_CARE_PROVIDER_SITE_OTHER): Payer: Medicare Other | Admitting: Pharmacist

## 2019-09-01 VITALS — BP 136/66 | HR 81 | Temp 98.8°F | Ht 64.8 in | Wt 202.8 lb

## 2019-09-01 DIAGNOSIS — Z6833 Body mass index (BMI) 33.0-33.9, adult: Secondary | ICD-10-CM

## 2019-09-01 DIAGNOSIS — I129 Hypertensive chronic kidney disease with stage 1 through stage 4 chronic kidney disease, or unspecified chronic kidney disease: Secondary | ICD-10-CM | POA: Diagnosis not present

## 2019-09-01 DIAGNOSIS — Z0000 Encounter for general adult medical examination without abnormal findings: Secondary | ICD-10-CM | POA: Diagnosis not present

## 2019-09-01 DIAGNOSIS — N182 Chronic kidney disease, stage 2 (mild): Secondary | ICD-10-CM

## 2019-09-01 DIAGNOSIS — E1122 Type 2 diabetes mellitus with diabetic chronic kidney disease: Secondary | ICD-10-CM

## 2019-09-01 DIAGNOSIS — E6609 Other obesity due to excess calories: Secondary | ICD-10-CM

## 2019-09-01 DIAGNOSIS — Z23 Encounter for immunization: Secondary | ICD-10-CM

## 2019-09-01 LAB — CBC
Hematocrit: 41.8 % (ref 34.0–46.6)
Hemoglobin: 13.5 g/dL (ref 11.1–15.9)
MCH: 28.8 pg (ref 26.6–33.0)
MCHC: 32.3 g/dL (ref 31.5–35.7)
MCV: 89 fL (ref 79–97)
Platelets: 307 10*3/uL (ref 150–450)
RBC: 4.69 x10E6/uL (ref 3.77–5.28)
RDW: 13.7 % (ref 11.7–15.4)
WBC: 7 10*3/uL (ref 3.4–10.8)

## 2019-09-01 LAB — LIPID PANEL
Chol/HDL Ratio: 3.2 ratio (ref 0.0–4.4)
Cholesterol, Total: 188 mg/dL (ref 100–199)
HDL: 58 mg/dL (ref >39)
LDL Chol Calc (NIH): 109 mg/dL — ABNORMAL HIGH (ref 0–99)
Triglycerides: 116 mg/dL (ref 0–149)
VLDL Cholesterol Cal: 21 mg/dL (ref 5–40)

## 2019-09-01 LAB — POCT UA - MICROALBUMIN
Albumin/Creatinine Ratio, Urine, POC: 30
Creatinine, POC: 10 mg/dL
Microalbumin Ur, POC: 10 mg/L

## 2019-09-01 LAB — CMP14+EGFR
ALT: 11 IU/L (ref 0–32)
AST: 22 IU/L (ref 0–40)
Albumin/Globulin Ratio: 1.6 (ref 1.2–2.2)
Albumin: 4.7 g/dL (ref 3.7–4.7)
Alkaline Phosphatase: 70 IU/L (ref 39–117)
BUN/Creatinine Ratio: 6 — ABNORMAL LOW (ref 12–28)
BUN: 6 mg/dL — ABNORMAL LOW (ref 8–27)
Bilirubin Total: 0.4 mg/dL (ref 0.0–1.2)
CO2: 27 mmol/L (ref 20–29)
Calcium: 10.2 mg/dL (ref 8.7–10.3)
Chloride: 97 mmol/L (ref 96–106)
Creatinine, Ser: 0.99 mg/dL (ref 0.57–1.00)
GFR calc Af Amer: 66 mL/min/{1.73_m2} (ref >59)
GFR calc non Af Amer: 58 mL/min/{1.73_m2} — ABNORMAL LOW (ref >59)
Globulin, Total: 3 g/dL (ref 1.5–4.5)
Glucose: 88 mg/dL (ref 65–99)
Potassium: 4.2 mmol/L (ref 3.5–5.2)
Sodium: 139 mmol/L (ref 134–144)
Total Protein: 7.7 g/dL (ref 6.0–8.5)

## 2019-09-01 LAB — HEMOGLOBIN A1C
Est. average glucose Bld gHb Est-mCnc: 154 mg/dL
Hgb A1c MFr Bld: 7 % — ABNORMAL HIGH (ref 4.8–5.6)

## 2019-09-01 NOTE — Patient Instructions (Signed)
Health Maintenance, Female Adopting a healthy lifestyle and getting preventive care are important in promoting health and wellness. Ask your health care provider about:  The right schedule for you to have regular tests and exams.  Things you can do on your own to prevent diseases and keep yourself healthy. What should I know about diet, weight, and exercise? Eat a healthy diet   Eat a diet that includes plenty of vegetables, fruits, low-fat dairy products, and lean protein.  Do not eat a lot of foods that are high in solid fats, added sugars, or sodium. Maintain a healthy weight Body mass index (BMI) is used to identify weight problems. It estimates body fat based on height and weight. Your health care provider can help determine your BMI and help you achieve or maintain a healthy weight. Get regular exercise Get regular exercise. This is one of the most important things you can do for your health. Most adults should:  Exercise for at least 150 minutes each week. The exercise should increase your heart rate and make you sweat (moderate-intensity exercise).  Do strengthening exercises at least twice a week. This is in addition to the moderate-intensity exercise.  Spend less time sitting. Even light physical activity can be beneficial. Watch cholesterol and blood lipids Have your blood tested for lipids and cholesterol at 72 years of age, then have this test every 5 years. Have your cholesterol levels checked more often if:  Your lipid or cholesterol levels are high.  You are older than 72 years of age.  You are at high risk for heart disease. What should I know about cancer screening? Depending on your health history and family history, you may need to have cancer screening at various ages. This may include screening for:  Breast cancer.  Cervical cancer.  Colorectal cancer.  Skin cancer.  Lung cancer. What should I know about heart disease, diabetes, and high blood  pressure? Blood pressure and heart disease  High blood pressure causes heart disease and increases the risk of stroke. This is more likely to develop in people who have high blood pressure readings, are of African descent, or are overweight.  Have your blood pressure checked: ? Every 3-5 years if you are 18-39 years of age. ? Every year if you are 40 years old or older. Diabetes Have regular diabetes screenings. This checks your fasting blood sugar level. Have the screening done:  Once every three years after age 40 if you are at a normal weight and have a low risk for diabetes.  More often and at a younger age if you are overweight or have a high risk for diabetes. What should I know about preventing infection? Hepatitis B If you have a higher risk for hepatitis B, you should be screened for this virus. Talk with your health care provider to find out if you are at risk for hepatitis B infection. Hepatitis C Testing is recommended for:  Everyone born from 1945 through 1965.  Anyone with known risk factors for hepatitis C. Sexually transmitted infections (STIs)  Get screened for STIs, including gonorrhea and chlamydia, if: ? You are sexually active and are younger than 72 years of age. ? You are older than 72 years of age and your health care provider tells you that you are at risk for this type of infection. ? Your sexual activity has changed since you were last screened, and you are at increased risk for chlamydia or gonorrhea. Ask your health care provider if   you are at risk.  Ask your health care provider about whether you are at high risk for HIV. Your health care provider may recommend a prescription medicine to help prevent HIV infection. If you choose to take medicine to prevent HIV, you should first get tested for HIV. You should then be tested every 3 months for as long as you are taking the medicine. Pregnancy  If you are about to stop having your period (premenopausal) and  you may become pregnant, seek counseling before you get pregnant.  Take 400 to 800 micrograms (mcg) of folic acid every day if you become pregnant.  Ask for birth control (contraception) if you want to prevent pregnancy. Osteoporosis and menopause Osteoporosis is a disease in which the bones lose minerals and strength with aging. This can result in bone fractures. If you are 65 years old or older, or if you are at risk for osteoporosis and fractures, ask your health care provider if you should:  Be screened for bone loss.  Take a calcium or vitamin D supplement to lower your risk of fractures.  Be given hormone replacement therapy (HRT) to treat symptoms of menopause. Follow these instructions at home: Lifestyle  Do not use any products that contain nicotine or tobacco, such as cigarettes, e-cigarettes, and chewing tobacco. If you need help quitting, ask your health care provider.  Do not use street drugs.  Do not share needles.  Ask your health care provider for help if you need support or information about quitting drugs. Alcohol use  Do not drink alcohol if: ? Your health care provider tells you not to drink. ? You are pregnant, may be pregnant, or are planning to become pregnant.  If you drink alcohol: ? Limit how much you use to 0-1 drink a day. ? Limit intake if you are breastfeeding.  Be aware of how much alcohol is in your drink. In the [place] one drink equals one 12 oz bottle of beer (355 mL), one 5 oz glass of wine (148 mL), or one 1 oz glass of hard liquor (44 mL). General instructions  Schedule regular health, dental, and eye exams.  Stay current with your vaccines.  Tell your health care provider if: ? You often feel depressed. ? You have ever been abused or do not feel safe at home. Summary  Adopting a healthy lifestyle and getting preventive care are important in promoting health and wellness.  Follow your health care provider's instructions about healthy  diet, exercising, and getting tested or screened for diseases.  Follow your health care provider's instructions on monitoring your cholesterol and blood pressure. This information is not intended to replace advice given to you by your health care provider. Make sure you discuss any questions you have with your health care provider. Document Released: 07/01/2011 Document Revised: 12/09/2018 Document Reviewed: 12/09/2018 Elsevier Patient Education  2020 [employer]  

## 2019-09-01 NOTE — Progress Notes (Signed)
Subjective:   ARLENIS BLAYDES is a 73 y.o. female who presents for Medicare Annual (Subsequent) preventive examination.  Review of Systems:  n/a Cardiac Risk Factors include: advanced age (>46men, >51 women);diabetes mellitus;hypertension;obesity (BMI >30kg/m2)     Objective:     Vitals: BP 136/66 (BP Location: Left Arm, Patient Position: Sitting, Cuff Size: Normal)   Pulse 81   Temp 98.8 F (37.1 C) (Oral)   Ht 5' 4.8" (1.646 m)   Wt 202 lb 12.8 oz (92 kg)   SpO2 95%   BMI 33.96 kg/m   Body mass index is 33.96 kg/m.  Advanced Directives 09/01/2019 09/12/2017 09/10/2017 09/28/2016 09/28/2016  Does Patient Have a Medical Advance Directive? No No No No No  Would patient like information on creating a medical advance directive? - No - Patient declined - No - patient declined information No - patient declined information    Tobacco Social History   Tobacco Use  Smoking Status Former Smoker  . Packs/day: 0.25  . Years: 42.00  . Pack years: 10.50  . Types: Cigarettes  . Quit date: 01/30/2006  . Years since quitting: 13.5  Smokeless Tobacco Never Used     Counseling given: Not Answered   Clinical Intake:  Pre-visit preparation completed: Yes  Pain : No/denies pain     Nutritional Status: BMI > 30  Obese Nutritional Risks: None Diabetes: Yes CBG done?: No Did pt. bring in CBG monitor from home?: No  How often do you need to have someone help you when you read instructions, pamphlets, or other written materials from your doctor or pharmacy?: 1 - Never What is the last grade level you completed in school?: 12th grade  Interpreter Needed?: No  Information entered by :: NAllen LPN  Past Medical History:  Diagnosis Date  . COPD (chronic obstructive pulmonary disease) ([place])   . Diabetes ([place])   . High cholesterol   . Hypertension   . Pneumonia 09/10/2017   Past Surgical History:  Procedure Laterality Date  . ABDOMINAL HYSTERECTOMY  1995  . carpel tunnel  surgery  2006  . COLONOSCOPY  2014  . COLOSTOMY  2007  . colostomy let down  2008  . endoscopy  2014   Family History  Problem Relation Age of Onset  . Heart murmur Mother   . Heart attack Father   . Diabetes Father   . Diabetes Brother    Social History   Socioeconomic History  . Marital status: Married    Spouse name: Not on file  . Number of children: Not on file  . Years of education: Not on file  . Highest education level: Not on file  Occupational History  . Occupation: Retired    Comment: Former Advertising copywriter to Scientist, physiological of education  Social Needs  . Financial resource strain: Not hard at all  . Food insecurity    Worry: Never true    Inability: Never true  . Transportation needs    Medical: No    Non-medical: No  Tobacco Use  . Smoking status: Former Smoker    Packs/day: 0.25    Years: 42.00    Pack years: 10.50    Types: Cigarettes    Quit date: 01/30/2006    Years since quitting: 13.5  . Smokeless tobacco: Never Used  Substance and Sexual Activity  . Alcohol use: No  . Drug use: No  . Sexual activity: Not Currently  Lifestyle  . Physical activity    Days per week:  5 days    Minutes per session: 20 min  . Stress: Not at all  Relationships  . Social Herbalist on phone: Not on file    Gets together: Not on file    Attends religious service: Not on file    Active member of club or organization: Not on file    Attends meetings of clubs or organizations: Not on file    Relationship status: Not on file  Other Topics Concern  . Not on file  Social History Narrative  . Not on file    Outpatient Encounter Medications as of 09/01/2019  Medication Sig  . ACCU-CHEK FASTCLIX LANCETS MISC Inject 1 each as directed daily. Use as directed to check blood sugars daily dx: e11.22  . albuterol (PROAIR HFA) 108 (90 Base) MCG/ACT inhaler Inhale 2 puffs into the lungs every 4 (four) hours as needed for wheezing or shortness of breath.  Marland Kitchen aspirin 81 MG  tablet Take 81 mg by mouth daily.   . Biotin 10000 MCG TBDP Take by mouth.  . bisoprolol (ZEBETA) 5 MG tablet TAKE 1/2 A TABLET BY MOUTH EVERY DAY  . CALCIUM-MAGNESIUM-ZINC PO Take 1 capsule by mouth daily.  . Cholecalciferol (VITAMIN D3) 2000 UNITS TABS Take 2,000 Units by mouth daily.  . DULoxetine (CYMBALTA) 30 MG capsule Take 1 capsule (30 mg total) by mouth daily.  . famotidine (PEPCID) 20 MG tablet TAKE 1 TABLET BY MOUTH AT BEDTIME  . gabapentin (NEURONTIN) 300 MG capsule Take 1 capsule (300 mg total) by mouth 4 (four) times daily.  Marland Kitchen glucose blood (ACCU-CHEK GUIDE) test strip 1 each by Other route daily. Use as instructed to check blood sugars 1 time per day dx: e11.22  . hydrochlorothiazide (HYDRODIURIL) 25 MG tablet TAKE 1 TABLET BY MOUTH EVERY DAY  . ipratropium-albuterol (DUONEB) 0.5-2.5 (3) MG/3ML SOLN Take 3 mLs by nebulization every 6 (six) hours as needed. DX: J44.9  . lidocaine (LIDODERM) 5 % Place 1 patch onto the skin daily. Remove & Discard patch within 12 hours or as directed by MD  . Magnesium 250 MG TABS Take 1 tablet by mouth daily.  . montelukast (SINGULAIR) 10 MG tablet TAKE 1 TABLET(10 MG) BY MOUTH AT BEDTIME  . pantoprazole (PROTONIX) 40 MG tablet TAKE 1 TABLET BY MOUTH EVERY DAY 30 TO 60 MINUTES BEFORE FIRST MEAL OF THE DAY  . rosuvastatin (CRESTOR) 20 MG tablet TAKE 1 TABLET BY MOUTH AT BEDTIME  . Semaglutide,0.25 or 0.5MG /DOS, (OZEMPIC, 0.25 OR 0.5 MG/DOSE,) 2 MG/1.5ML SOPN Inject 0.5 mg into the skin once a week.  . SYMBICORT 80-4.5 MCG/ACT inhaler INHALE 2 PUFFS BY MOUTH TWICE DAILY  . BLACK CURRANT SEED OIL PO Take by mouth.  . chlorpheniramine (CHLOR-TRIMETON) 4 MG tablet Take 4 mg by mouth 2 (two) times daily as needed for allergies.  Marland Kitchen oxyCODONE (ROXICODONE) 5 MG immediate release tablet Take 1 tablet (5 mg total) by mouth every 4 (four) hours as needed (as needed for severe chronic cough). prn for severe chronic cough (Patient not taking: Reported on 09/01/2019)    No facility-administered encounter medications on file as of 09/01/2019.     Activities of Daily Living In your present state of health, do you have any difficulty performing the following activities: 09/01/2019  Hearing? N  Difficulty concentrating or making decisions? N  Walking or climbing stairs? N  Dressing or bathing? N  Doing errands, shopping? N  Preparing Food and eating ? N  Using the  Toilet? N  In the past six months, have you accidently leaked urine? Y  Do you have problems with loss of bowel control? N  Managing your Medications? N  Managing your Finances? N  Housekeeping or managing your Housekeeping? N  Some recent data might be hidden    Patient Care Team: [name], MD as PCP - General (Internal Medicine)    Assessment:   This is a routine wellness examination for Yazmin.  Exercise Activities and Dietary recommendations Current Exercise Habits: Home exercise routine, Type of exercise: walking(you tube videos), Time (Minutes): 25, Frequency (Times/Week): 5, Weekly Exercise (Minutes/Week): 125  Goals    . Weight (lb) < 200 lb (90.7 kg)     09/01/2019, would like to lose 30 pounds       Fall Risk Fall Risk  09/01/2019 07/01/2019 06/07/2019 02/03/2019 11/09/2018  Falls in the past year? 0 0 0 0 0  Comment - - - - -  Number falls in past yr: - - - - -  Comment - - - - -  Injury with Fall? - - - - -  Risk for fall due to : Medication side effect - - - -  Follow up Falls evaluation completed;Falls prevention discussed - - - -   Is the patient's home free of loose throw rugs in walkways, pet beds, electrical cords, etc?   yes      Grab bars in the bathroom? no      Handrails on the stairs?   yes      Adequate lighting?   yes  Timed Get Up and Go performed: n/a  Depression Screen PHQ 2/9 Scores 09/01/2019 07/01/2019 06/07/2019 02/03/2019  PHQ - 2 Score 3 0 0 0  PHQ- 9 Score 3 - - -     Cognitive Function     6CIT Screen 09/01/2019  What Year? 0 points  What  month? 0 points  What time? 0 points  Count back from 20 0 points  Months in reverse 0 points  Repeat phrase 0 points  Total Score 0    Immunization History  Administered Date(s) Administered  . Influenza Split 09/29/2013, 09/30/2015  . Influenza Whole 08/30/2016  . Influenza, High Dose Seasonal PF 09/29/2017, 09/14/2018, 09/01/2019  . Influenza,inj,Quad PF,6+ Mos 09/12/2014  . Influenza-Unspecified 09/14/2018  . Pneumococcal-Unspecified 10/05/2013  . Tdap 06/07/2019    Qualifies for Shingles Vaccine? yes  Screening Tests Health Maintenance  Topic Date Due  . PNA vac Low Risk Adult (2 of 2 - PCV13) 10/05/2014  . OPHTHALMOLOGY EXAM  10/15/2019  . HEMOGLOBIN A1C  12/07/2019  . FOOT EXAM  02/04/2020  . URINE MICROALBUMIN  02/04/2020  . MAMMOGRAM  02/04/2021  . COLONOSCOPY  04/30/2023  . TETANUS/TDAP  06/06/2029  . INFLUENZA VACCINE  Completed  . DEXA SCAN  Completed  . Hepatitis C Screening  Completed    Cancer Screenings: Lung: Low Dose CT Chest recommended if Age 66-80 years, 30 pack-year currently smoking OR have quit w/in 15years. Patient does not qualify. Breast:  Up to date on Mammogram? Yes   Up to date of Bone Density/Dexa? Yes Colorectal: up to date per patient  Additional Screenings: : Hepatitis C Screening: 04/2013     Plan:    Patient would like to lose 30 pounds.   I have personally reviewed and noted the following in the patient's chart:   . Medical and social history . Use of alcohol, tobacco or illicit drugs  . Current medications  and supplements . Functional ability and status . Nutritional status . Physical activity . Advanced directives . List of other physicians . Hospitalizations, surgeries, and ER visits in previous 12 months . Vitals . Screenings to include cognitive, depression, and falls . Referrals and appointments  In addition, I have reviewed and discussed with patient certain preventive protocols, quality metrics, and best  practice recommendations. A written personalized care plan for preventive services as well as general preventive health recommendations were provided to patient.     Kellie Simmering, LPN  07/08/9871

## 2019-09-01 NOTE — Patient Instructions (Signed)
[name] , Thank you for taking time to come for your Medicare Wellness Visit. I appreciate your ongoing commitment to your health goals. Please review the following plan we discussed and let me know if I can assist you in the future.   Screening recommendations/referrals: Colonoscopy: 04/2013 Mammogram: 01/2019 Bone Density: 02/2015 Recommended yearly ophthalmology/optometry visit for glaucoma screening and checkup Recommended yearly dental visit for hygiene and checkup  Vaccinations: Influenza vaccine: today Pneumococcal vaccine: 09/2013 Tdap vaccine: 05/2019 Shingles vaccine: discussed    Advanced directives: Advance directive discussed with you today. Even though you declined this today please call our office should you change your mind and we can give you the proper paperwork for you to fill out.   Conditions/risks identified: obesity  Next appointment: 09/01/2019   Preventive Care 2 Years and Older, Female Preventive care refers to lifestyle choices and visits with your health care provider that can promote health and wellness. What does preventive care include?  A yearly physical exam. This is also called an annual well check.  Dental exams once or twice a year.  Routine eye exams. Ask your health care provider how often you should have your eyes checked.  Personal lifestyle choices, including:  Daily care of your teeth and gums.  Regular physical activity.  Eating a healthy diet.  Avoiding tobacco and drug use.  Limiting alcohol use.  Practicing safe sex.  Taking low-dose aspirin every day.  Taking vitamin and mineral supplements as recommended by your health care provider. What happens during an annual well check? The services and screenings done by your health care provider during your annual well check will depend on your age, overall health, lifestyle risk factors, and family history of disease. Counseling  Your health care provider may ask you  questions about your:  Alcohol use.  Tobacco use.  Drug use.  Emotional well-being.  Home and relationship well-being.  Sexual activity.  Eating habits.  History of falls.  Memory and ability to understand (cognition).  Work and work Statistician.  Reproductive health. Screening  You may have the following tests or measurements:  Height, weight, and BMI.  Blood pressure.  Lipid and cholesterol levels. These may be checked every 5 years, or more frequently if you are over 84 years old.  Skin check.  Lung cancer screening. You may have this screening every year starting at age 80 if you have a 30-pack-year history of smoking and currently smoke or have quit within the past 15 years.  Fecal occult blood test (FOBT) of the stool. You may have this test every year starting at age 79.  Flexible sigmoidoscopy or colonoscopy. You may have a sigmoidoscopy every 5 years or a colonoscopy every 10 years starting at age 44.  Hepatitis C blood test.  Hepatitis B blood test.  Sexually transmitted disease (STD) testing.  Diabetes screening. This is done by checking your blood sugar (glucose) after you have not eaten for a while (fasting). You may have this done every 1-3 years.  Bone density scan. This is done to screen for osteoporosis. You may have this done starting at age 61.  Mammogram. This may be done every 1-2 years. Talk to your health care provider about how often you should have regular mammograms. Talk with your health care provider about your test results, treatment options, and if necessary, the need for more tests. Vaccines  Your health care provider may recommend certain vaccines, such as:  Influenza vaccine. This is recommended every year.  Tetanus, diphtheria, and acellular pertussis (Tdap, Td) vaccine. You may need a Td booster every 10 years.  Zoster vaccine. You may need this after age 74.  Pneumococcal 13-valent conjugate (PCV13) vaccine. One dose is  recommended after age 76.  Pneumococcal polysaccharide (PPSV23) vaccine. One dose is recommended after age 12. Talk to your health care provider about which screenings and vaccines you need and how often you need them. This information is not intended to replace advice given to you by your health care provider. Make sure you discuss any questions you have with your health care provider. Document Released: 01/12/2016 Document Revised: 09/04/2016 Document Reviewed: 10/17/2015 Elsevier Interactive Patient Education  2017 [place] Prevention in the Home Falls can cause injuries. They can happen to people of all ages. There are many things you can do to make your home safe and to help prevent falls. What can I do on the outside of my home?  Regularly fix the edges of walkways and driveways and fix any cracks.  Remove anything that might make you trip as you walk through a door, such as a raised step or threshold.  Trim any bushes or trees on the path to your home.  Use bright outdoor lighting.  Clear any walking paths of anything that might make someone trip, such as rocks or tools.  Regularly check to see if handrails are loose or broken. Make sure that both sides of any steps have handrails.  Any raised decks and porches should have guardrails on the edges.  Have any leaves, snow, or ice cleared regularly.  Use sand or salt on walking paths during winter.  Clean up any spills in your garage right away. This includes oil or grease spills. What can I do in the bathroom?  Use night lights.  Install grab bars by the toilet and in the tub and shower. Do not use towel bars as grab bars.  Use non-skid mats or decals in the tub or shower.  If you need to sit down in the shower, use a plastic, non-slip stool.  Keep the floor dry. Clean up any water that spills on the floor as soon as it happens.  Remove soap buildup in the tub or shower regularly.  Attach bath mats  securely with double-sided non-slip rug tape.  Do not have throw rugs and other things on the floor that can make you trip. What can I do in the bedroom?  Use night lights.  Make sure that you have a light by your bed that is easy to reach.  Do not use any sheets or blankets that are too big for your bed. They should not hang down onto the floor.  Have a firm chair that has side arms. You can use this for support while you get dressed.  Do not have throw rugs and other things on the floor that can make you trip. What can I do in the kitchen?  Clean up any spills right away.  Avoid walking on wet floors.  Keep items that you use a lot in easy-to-reach places.  If you need to reach something above you, use a strong step stool that has a grab bar.  Keep electrical cords out of the way.  Do not use floor polish or wax that makes floors slippery. If you must use wax, use non-skid floor wax.  Do not have throw rugs and other things on the floor that can make you trip. What can I do  with my stairs?  Do not leave any items on the stairs.  Make sure that there are handrails on both sides of the stairs and use them. Fix handrails that are broken or loose. Make sure that handrails are as long as the stairways.  Check any carpeting to make sure that it is firmly attached to the stairs. Fix any carpet that is loose or worn.  Avoid having throw rugs at the top or bottom of the stairs. If you do have throw rugs, attach them to the floor with carpet tape.  Make sure that you have a light switch at the top of the stairs and the bottom of the stairs. If you do not have them, ask someone to add them for you. What else can I do to help prevent falls?  Wear shoes that:  Do not have high heels.  Have rubber bottoms.  Are comfortable and fit you well.  Are closed at the toe. Do not wear sandals.  If you use a stepladder:  Make sure that it is fully opened. Do not climb a closed  stepladder.  Make sure that both sides of the stepladder are locked into place.  Ask someone to hold it for you, if possible.  Clearly mark and make sure that you can see:  Any grab bars or handrails.  First and last steps.  Where the edge of each step is.  Use tools that help you move around (mobility aids) if they are needed. These include:  Canes.  Walkers.  Scooters.  Crutches.  Turn on the lights when you go into a dark area. Replace any light bulbs as soon as they burn out.  Set up your furniture so you have a clear path. Avoid moving your furniture around.  If any of your floors are uneven, fix them.  If there are any pets around you, be aware of where they are.  Review your medicines with your doctor. Some medicines can make you feel dizzy. This can increase your chance of falling. Ask your doctor what other things that you can do to help prevent falls. This information is not intended to replace advice given to you by your health care provider. Make sure you discuss any questions you have with your health care provider. Document Released: 10/12/2009 Document Revised: 05/23/2016 Document Reviewed: 01/20/2015 Elsevier Interactive Patient Education  2017 [employer].

## 2019-09-01 NOTE — Progress Notes (Signed)
Chronic Care Management   Initial Visit Note  09/01/2019 Name: [name] MRN: [id number] DOB: 1946/05/04  Referred by: [name], MD Reason for referral : Chronic Care Management   [name] is a 73 y.o. year old female who is a primary care patient of [name], MD. The CCM team was consulted for assistance with chronic disease management and care coordination needs.   Review of patient status, including review of consultants reports, relevant laboratory and other test results, and collaboration with appropriate care team members and the patient's provider was performed as part of comprehensive patient evaluation and provision of chronic care management services.    I met with [name] is clinic today.  Advanced Directives Status: N See Care Plan and [employer] application for related entries.   Medications: Outpatient Encounter Medications as of 09/01/2019  Medication Sig  . albuterol (PROAIR HFA) 108 (90 Base) MCG/ACT inhaler Inhale 2 puffs into the lungs every 4 (four) hours as needed for wheezing or shortness of breath.  Marland Kitchen aspirin 81 MG tablet Take 81 mg by mouth daily.   . Biotin 10000 MCG TBDP Take by mouth.  . bisoprolol (ZEBETA) 5 MG tablet TAKE 1/2 A TABLET BY MOUTH EVERY DAY  . CALCIUM-MAGNESIUM-ZINC PO Take 1 capsule by mouth daily.  . Cholecalciferol (VITAMIN D3) 2000 UNITS TABS Take 2,000 Units by mouth daily.  . DULoxetine (CYMBALTA) 30 MG capsule Take 1 capsule (30 mg total) by mouth daily.  . famotidine (PEPCID) 20 MG tablet TAKE 1 TABLET BY MOUTH AT BEDTIME  . hydrochlorothiazide (HYDRODIURIL) 25 MG tablet TAKE 1 TABLET BY MOUTH EVERY DAY  . lidocaine (LIDODERM) 5 % Place 1 patch onto the skin daily. Remove & Discard patch within 12 hours or as directed by MD  . Magnesium 250 MG TABS Take 1 tablet by mouth daily.  . montelukast (SINGULAIR) 10 MG tablet TAKE 1 TABLET(10 MG) BY MOUTH AT BEDTIME  . pantoprazole (PROTONIX) 40 MG tablet TAKE 1 TABLET  BY MOUTH EVERY DAY 30 TO 60 MINUTES BEFORE FIRST MEAL OF THE DAY  . rosuvastatin (CRESTOR) 20 MG tablet TAKE 1 TABLET BY MOUTH AT BEDTIME  . Semaglutide,0.25 or 0.5MG/DOS, (OZEMPIC, 0.25 OR 0.5 MG/DOSE,) 2 MG/1.5ML SOPN Inject 0.5 mg into the skin once a week.  . SYMBICORT 80-4.5 MCG/ACT inhaler INHALE 2 PUFFS BY MOUTH TWICE DAILY  . [DISCONTINUED] gabapentin (NEURONTIN) 300 MG capsule Take 1 capsule (300 mg total) by mouth 4 (four) times daily. (Patient taking differently: Take 300 mg by mouth. 2- 3 times daily)  . [DISCONTINUED] glucose blood (ACCU-CHEK GUIDE) test strip 1 each by Other route daily. Use as instructed to check blood sugars 1 time per day dx: e11.22  . BLACK CURRANT SEED OIL PO Take by mouth.  Marland Kitchen ipratropium-albuterol (DUONEB) 0.5-2.5 (3) MG/3ML SOLN Take 3 mLs by nebulization every 6 (six) hours as needed. DX: J44.9 (Patient not taking: Reported on 09/01/2019)  . oxyCODONE (ROXICODONE) 5 MG immediate release tablet Take 1 tablet (5 mg total) by mouth every 4 (four) hours as needed (as needed for severe chronic cough). prn for severe chronic cough (Patient not taking: Reported on 09/01/2019)  . [DISCONTINUED] ACCU-CHEK FASTCLIX LANCETS MISC Inject 1 each as directed daily. Use as directed to check blood sugars daily dx: e11.22 (Patient not taking: Reported on 09/01/2019)  . [DISCONTINUED] chlorpheniramine (CHLOR-TRIMETON) 4 MG tablet Take 4 mg by mouth 2 (two) times daily as needed for allergies.   No facility-administered encounter medications  on file as of 09/01/2019.      Objective:   Goals Addressed            This Visit's Progress     Patient Stated   . I would like to apply for financial assistance for my Ozempic (pt-stated)       Current Barriers:  . Financial Barriers: patient has  insurance and reports copay for Ozempic is cost prohibitive at this time  Pharmacist Clinical Goal(s):  Marland Kitchen Over the next 30 days, patient will work with PharmD and providers to relieve  medication access concerns  Interventions: . Comprehensive medication review completed; medication list updated in electronic medical record.  [name] by [employer]: Patient meets income/NO out of pocket spend criteria for this medication's patient assistance program. Reviewed application process. Patient will provide proof of income, out of pocket spend report, and will sign application. Will collaborate with primary care provider, Dr. [name] for their portion of application. Once completed, will submit to [employer] patient assistance program. . Application has been submitted.   Patient Self Care Activities:  . Patient will provide necessary portions of application   Initial goal documentation     . I would like to optimize my medication management of my chronic conditions (pt-stated)       Current Barriers:  . Diabetes: T2DM; most recent A1c 7.0% on 09/01/19 (A1c 7.0% has remained the same as labs from 06/07/19.  Patient is motivated to keep at goal.  A1c has been rising over the past year.)  . Current antihyperglycemic regimen: Ozempic 0.36m-->increasing to Ozempic 172mweekly with patient assistance program. . denies hypoglycemic symptoms, denies hyperglycemic symptoms . Current meal patterns: o Breakfast/Lunch/Supper: encouraged patient to have protein, vegetable, low carb side o Snacks: increased sweets (had honey bun yesterday evening) o Drinks:mostly water, counseled patient to avoid sugary beverages, etc . Current exercise: n/a . Current blood glucose readings: has not been checking blood sugars.  Encouraged patient to check Bgs once a week in the mornings before she injects  . Cardiovascular risk reduction: o Current hypertensive regimen: bisoprolol, HCTZ o Current hyperlipidemia regimen: rosuvastatin 2012mHS  Pharmacist Clinical Goal(s):  . OMarland Kitchener the next 90 days, patient with work with PharmD and primary care provider to address needs related to optimized medication  management of chronic conditions  Interventions: Face to face visit completed with patient on 09/01/19 . Comprehensive medication review performed, medication list updated in electronic medical record . Comprehensive medication review performed.  Reviewed medication fill history via insurance claims data confirming patient appears compliant with having his medications filled on time as prescribed by provider. . Reviewed & discussed the following diabetes-related information with patient: o Continue checking blood sugars as directed o Follow ADA recommended "diabetes-friendly" diet  (reviewed healthy snack/food options) o Discussed GLP-1 injection technique; Patient uses AccuCheck glucometer o Reviewed medication purpose/side effects-->patient denies adverse events o Continue taking all medications as prescribed by provider .   Patient Self Care Activities:  . Patient will check blood glucose once weekly , document, and provide at future appointments . Patient will focus on medication adherence by continued compliance with medications as prescribed. . Patient will take medications as prescribed . Patient will contact provider with any episodes of hypoglycemia . Patient will report any questions or concerns to provider   Initial goal documentation         [name] was given information about Chronic Care Management services today including:  1. CCM service includes personalized  support from designated clinical staff supervised by her physician, including individualized plan of care and coordination with other care providers 2. 24/7 contact phone numbers for assistance for urgent and routine care needs. 3. Service will only be billed when office clinical staff spend 20 minutes or more in a month to coordinate care. 4. Only one practitioner may furnish and bill the service in a calendar month. 5. The patient may stop CCM services at any time (effective at the end of the month) by phone  call to the office staff. 6. The patient will be responsible for cost sharing (co-pay) of up to 20% of the service fee (after annual deductible is met).  Patient agreed to services and verbal consent obtained.   Plan:   The care management team will reach out to the patient again over the next 2-4 weeks.  Regina Eck, PharmD, BCPS Clinical Pharmacist, [facility] [place]: [phone]

## 2019-09-01 NOTE — Progress Notes (Signed)
Subjective:     Patient ID: [name] , female    DOB: 01/30/1946 , 73 y.o.   MRN: [id number]   Chief Complaint  Patient presents with  . Annual Exam  . Diabetes  . Hypertension    HPI  She is here today for diabetes and bp check.  She would also like to have full physical examination.  She is no longer followed by GYN.   Diabetes She presents for her follow-up diabetic visit. She has type 2 diabetes mellitus. There are no hypoglycemic associated symptoms. There are no diabetic associated symptoms. Pertinent negatives for diabetes include no blurred vision and no chest pain. There are no hypoglycemic complications. Risk factors for coronary artery disease include diabetes mellitus, dyslipidemia and hypertension. She participates in exercise daily. An ACE inhibitor/angiotensin II receptor blocker is being taken. Eye exam is current.  Hypertension This is a chronic problem. The current episode started more than 1 year ago. The problem has been gradually improving since onset. The problem is controlled. Pertinent negatives include no blurred vision, chest pain, palpitations or shortness of breath. Past treatments include beta blockers and diuretics. The current treatment provides moderate improvement.     Past Medical History:  Diagnosis Date  . COPD (chronic obstructive pulmonary disease) ([place])   . Diabetes ([place])   . High cholesterol   . Hypertension   . Pneumonia 09/10/2017     Family History  Problem Relation Age of Onset  . Heart murmur Mother   . Heart attack Father   . Diabetes Father   . Diabetes Brother      Current Outpatient Medications:  .  ACCU-CHEK FASTCLIX LANCETS MISC, Inject 1 each as directed daily. Use as directed to check blood sugars daily dx: e11.22 (Patient not taking: Reported on 09/01/2019), Disp: 50 each, Rfl: 11 .  albuterol (PROAIR HFA) 108 (90 Base) MCG/ACT inhaler, Inhale 2 puffs into the lungs every 4 (four) hours as needed for wheezing or  shortness of breath., Disp: 1 Inhaler, Rfl: 11 .  aspirin 81 MG tablet, Take 81 mg by mouth daily. , Disp: , Rfl:  .  Biotin 10000 MCG TBDP, Take by mouth., Disp: , Rfl:  .  bisoprolol (ZEBETA) 5 MG tablet, TAKE 1/2 A TABLET BY MOUTH EVERY DAY, Disp: 45 tablet, Rfl: 0 .  BLACK CURRANT SEED OIL PO, Take by mouth., Disp: , Rfl:  .  CALCIUM-MAGNESIUM-ZINC PO, Take 1 capsule by mouth daily., Disp: , Rfl:  .  Cholecalciferol (VITAMIN D3) 2000 UNITS TABS, Take 2,000 Units by mouth daily., Disp: , Rfl:  .  DULoxetine (CYMBALTA) 30 MG capsule, Take 1 capsule (30 mg total) by mouth daily., Disp: 30 capsule, Rfl: 2 .  famotidine (PEPCID) 20 MG tablet, TAKE 1 TABLET BY MOUTH AT BEDTIME, Disp: 90 tablet, Rfl: 3 .  gabapentin (NEURONTIN) 300 MG capsule, Take 1 capsule (300 mg total) by mouth 4 (four) times daily. (Patient taking differently: Take 300 mg by mouth. 2- 3 times daily), Disp: 120 capsule, Rfl: 2 .  glucose blood (ACCU-CHEK GUIDE) test strip, 1 each by Other route daily. Use as instructed to check blood sugars 1 time per day dx: e11.22, Disp: 50 each, Rfl: 11 .  hydrochlorothiazide (HYDRODIURIL) 25 MG tablet, TAKE 1 TABLET BY MOUTH EVERY DAY, Disp: 30 tablet, Rfl: 2 .  ipratropium-albuterol (DUONEB) 0.5-2.5 (3) MG/3ML SOLN, Take 3 mLs by nebulization every 6 (six) hours as needed. DX: J44.9 (Patient not taking: Reported on 09/01/2019), Disp:  120 mL, Rfl: 2 .  lidocaine (LIDODERM) 5 %, Place 1 patch onto the skin daily. Remove & Discard patch within 12 hours or as directed by MD, Disp: 30 patch, Rfl: 1 .  Magnesium 250 MG TABS, Take 1 tablet by mouth daily., Disp: , Rfl:  .  montelukast (SINGULAIR) 10 MG tablet, TAKE 1 TABLET(10 MG) BY MOUTH AT BEDTIME, Disp: 30 tablet, Rfl: 11 .  oxyCODONE (ROXICODONE) 5 MG immediate release tablet, Take 1 tablet (5 mg total) by mouth every 4 (four) hours as needed (as needed for severe chronic cough). prn for severe chronic cough (Patient not taking: Reported on  09/01/2019), Disp: 40 tablet, Rfl: 0 .  pantoprazole (PROTONIX) 40 MG tablet, TAKE 1 TABLET BY MOUTH EVERY DAY 30 TO 60 MINUTES BEFORE FIRST MEAL OF THE DAY, Disp: 30 tablet, Rfl: 2 .  rosuvastatin (CRESTOR) 20 MG tablet, TAKE 1 TABLET BY MOUTH AT BEDTIME, Disp: 90 tablet, Rfl: 0 .  Semaglutide,0.25 or 0.5MG/DOS, (OZEMPIC, 0.25 OR 0.5 MG/DOSE,) 2 MG/1.5ML SOPN, Inject 0.5 mg into the skin once a week., Disp: 3 pen, Rfl: 1 .  SYMBICORT 80-4.5 MCG/ACT inhaler, INHALE 2 PUFFS BY MOUTH TWICE DAILY, Disp: 10.2 g, Rfl: 5   Allergies  Allergen Reactions  . Other Itching    HAIR DYE     Review of Systems  Constitutional: Negative.   HENT: Negative.   Eyes: Negative.  Negative for blurred vision.  Respiratory: Negative.  Negative for shortness of breath.   Cardiovascular: Negative.  Negative for chest pain and palpitations.  Endocrine: Negative.   Genitourinary: Negative.   Musculoskeletal: Negative.   Skin: Negative.   Allergic/Immunologic: Negative.   Neurological: Negative.   Hematological: Negative.   Psychiatric/Behavioral: Negative.      Today's Vitals   09/01/19 0945  BP: 136/66  Pulse: 81  Temp: 98.8 F (37.1 C)  TempSrc: Oral  SpO2: 95%  Weight: 202 lb 12.8 oz (92 kg)  Height: 5' 4.8" (1.646 m)   Body mass index is 33.96 kg/m.   Objective:  Physical Exam Vitals signs and nursing note reviewed.  Constitutional:      Appearance: Normal appearance.  HENT:     Head: Normocephalic and atraumatic.     Right Ear: Tympanic membrane, ear canal and external ear normal.     Left Ear: Tympanic membrane, ear canal and external ear normal.     Nose: Nose normal.     Mouth/Throat:     Mouth: Mucous membranes are moist.     Pharynx: Oropharynx is clear.  Eyes:     Extraocular Movements: Extraocular movements intact.     Conjunctiva/sclera: Conjunctivae normal.     Pupils: Pupils are equal, round, and reactive to light.  Neck:     Musculoskeletal: Normal range of motion and  neck supple.  Cardiovascular:     Rate and Rhythm: Normal rate and regular rhythm.     Pulses:          Dorsalis pedis pulses are 2+ on the right side and 2+ on the left side.       Posterior tibial pulses are 1+ on the right side and 1+ on the left side.     Heart sounds: Normal heart sounds.  Pulmonary:     Effort: Pulmonary effort is normal.     Breath sounds: Normal breath sounds.  Chest:     Breasts: Tanner Score is 5.        Right: Normal. No bleeding, inverted  nipple, mass, nipple discharge or skin change.        Left: Normal. No swelling, bleeding, inverted nipple, mass, nipple discharge or skin change.  Abdominal:     General: Abdomen is flat. Bowel sounds are normal.     Palpations: Abdomen is soft.  Genitourinary:    Comments: deferred Musculoskeletal: Normal range of motion.  Feet:     Right foot:     Protective Sensation: 5 sites tested. 5 sites sensed.     Skin integrity: Skin integrity normal.     Toenail Condition: Right toenails are abnormally thick.     Left foot:     Protective Sensation: 5 sites tested. 5 sites sensed.     Skin integrity: Callus present.     Toenail Condition: Left toenails are abnormally thick.     Comments: Darkened toenails b/l Skin:    General: Skin is warm and dry.  Neurological:     General: No focal deficit present.     Mental Status: She is alert and oriented to person, place, and time.  Psychiatric:        Mood and Affect: Mood normal.        Behavior: Behavior normal.         Assessment And Plan:    1. Diabetes mellitus with stage 2 chronic kidney disease ([place])  Diabetic foot exam was performed. I will increase her Ozempic 33m once weekly to help with appetite control and improved glycemic control.  I will also refer her to CCM for med assistance. She is accepting of her treatment plan. She will rto in six weeks for f/u higher dose of Ozempic.   I DISCUSSED WITH THE PATIENT AT LENGTH REGARDING THE GOALS OF GLYCEMIC CONTROL  AND POSSIBLE LONG-TERM COMPLICATIONS.  I  ALSO STRESSED THE IMPORTANCE OF COMPLIANCE WITH HOME GLUCOSE MONITORING, DIETARY RESTRICTIONS INCLUDING AVOIDANCE OF SUGARY DRINKS/PROCESSED FOODS,  ALONG WITH REGULAR EXERCISE.  I  ALSO STRESSED THE IMPORTANCE OF ANNUAL EYE EXAMS, SELF FOOT CARE AND COMPLIANCE WITH OFFICE VISITS.  - CMP14+EGFR - CBC - Lipid panel - Hemoglobin A1c - Referral to Chronic Care Management Services  2. Parenchymal renal hypertension, stage 1 through stage 4 or unspecified chronic kidney disease  Chronic, fair control. She will continue with current meds for now. She is encouraged to avoid adding salt to her foods. EKG performed, NSR w/ RAE - no new changes.  - EKG 12-Lead - Referral to Chronic Care Management Services  3. Class 1 obesity due to excess calories with serious comorbidity and body mass index (BMI) of 33.0 to 33.9 in adult  Importance of achieving optimal weight to decrease risk of cardiovascular disease and cancers was discussed with the patient in full detail. She was congratulated on her new exercise regimen. She was given tips to incorporate more activity into her daily routine - take stairs when possible, park farther away from her job, grocery stores, etc. I offered to refer her to nutrition for further dietary guidance, but she declined.   4. Routine general medical examination at health care facility  A full exam was performed. Importance of monthly self breast exams was discussed with the patient.  PATIENT HAS BEEN ADVISED TO GET 30-45 MINUTES REGULAR EXERCISE NO LESS THAN FOUR TO FIVE DAYS PER WEEK - BOTH WEIGHTBEARING EXERCISES AND AEROBIC ARE RECOMMENDED.  SHE WAS ADVISED TO FOLLOW A HEALTHY DIET WITH AT LEAST SIX FRUITS/VEGGIES PER DAY, DECREASE INTAKE OF RED MEAT, AND TO INCREASE FISH INTAKE TO  TWO DAYS PER WEEK.  MEATS/FISH SHOULD NOT BE FRIED, BAKED OR BROILED IS PREFERABLE.  I SUGGEST WEARING SPF 50 SUNSCREEN ON EXPOSED PARTS AND ESPECIALLY WHEN IN  THE DIRECT SUNLIGHT FOR AN EXTENDED PERIOD OF TIME.  PLEASE AVOID FAST FOOD RESTAURANTS AND INCREASE YOUR WATER INTAKE.   [name], MD    THE PATIENT IS ENCOURAGED TO PRACTICE SOCIAL DISTANCING DUE TO THE COVID-19 PANDEMIC.

## 2019-09-05 ENCOUNTER — Encounter: Payer: Self-pay | Admitting: Internal Medicine

## 2019-09-08 ENCOUNTER — Other Ambulatory Visit: Payer: Self-pay | Admitting: Pharmacy Technician

## 2019-09-08 NOTE — Patient Outreach (Signed)
[facility]) Care Management  09/08/2019  [name] 03/11/1946 [id number]                                       Medication Assistance Referral  Referral From: [facility] R[facility] [name] (Clinic RPh)  Medication/Company: [name] / [name] Patient application portion:  N/A received from [place] Provider application portion:  N/A received from [place] completed application and required documents into [employer]. Will follow up with company in 3-5 business days.  [name] [name] [place] Certified Pharmacy Technician [place] Management Direct Dial:[phone]

## 2019-09-10 ENCOUNTER — Encounter: Payer: Self-pay | Admitting: Internal Medicine

## 2019-09-10 ENCOUNTER — Other Ambulatory Visit: Payer: Self-pay

## 2019-09-10 DIAGNOSIS — N182 Chronic kidney disease, stage 2 (mild): Secondary | ICD-10-CM

## 2019-09-10 DIAGNOSIS — E1122 Type 2 diabetes mellitus with diabetic chronic kidney disease: Secondary | ICD-10-CM

## 2019-09-10 MED ORDER — ACCU-CHEK GUIDE VI STRP: 1.0000 | ORAL_STRIP | Freq: Every day | 11 refills | Status: DC

## 2019-09-10 MED ORDER — ACCU-CHEK FASTCLIX LANCETS MISC: 1.0000 | Freq: Every day | 11 refills | Status: DC

## 2019-09-13 ENCOUNTER — Ambulatory Visit: Payer: Self-pay | Admitting: Pharmacist

## 2019-09-13 DIAGNOSIS — E1122 Type 2 diabetes mellitus with diabetic chronic kidney disease: Secondary | ICD-10-CM

## 2019-09-13 DIAGNOSIS — N182 Chronic kidney disease, stage 2 (mild): Secondary | ICD-10-CM

## 2019-09-14 ENCOUNTER — Other Ambulatory Visit: Payer: Self-pay | Admitting: Internal Medicine

## 2019-09-14 MED ORDER — GABAPENTIN 300 MG PO CAPS: 300.0000 mg | ORAL_CAPSULE | Freq: Four times a day (QID) | ORAL | 5 refills | Status: DC

## 2019-09-14 NOTE — Progress Notes (Signed)
Chronic Care Management   Visit Note  09/13/2019 Name: [name] MRN: [id number] DOB: 06/27/1946  Referred by: [name], MD Reason for referral : Chronic Care Management   [name] is a 73 y.o. year old female who is a primary care patient of [name], MD. The CCM team was consulted for assistance with chronic disease management and care coordination needs.   Review of patient status, including review of consultants reports, relevant laboratory and other test results, and collaboration with appropriate care team members and the patient's provider was performed as part of comprehensive patient evaluation and provision of chronic care management services.    I communicated with Ms. Nurse by telephone today.  Advanced Directives Status: N See Care Plan and Vynca application for related entries.   Medications: Outpatient Encounter Medications as of 09/13/2019  Medication Sig  . Accu-Chek FastClix Lancets MISC Inject 1 each as directed daily. Use as directed to check blood sugars daily dx: e11.22  . albuterol (PROAIR HFA) 108 (90 Base) MCG/ACT inhaler Inhale 2 puffs into the lungs every 4 (four) hours as needed for wheezing or shortness of breath.  Marland Kitchen aspirin 81 MG tablet Take 81 mg by mouth daily.   . Biotin 10000 MCG TBDP Take by mouth.  . bisoprolol (ZEBETA) 5 MG tablet TAKE 1/2 A TABLET BY MOUTH EVERY DAY  . BLACK CURRANT SEED OIL PO Take by mouth.  Marland Kitchen CALCIUM-MAGNESIUM-ZINC PO Take 1 capsule by mouth daily.  . Cholecalciferol (VITAMIN D3) 2000 UNITS TABS Take 2,000 Units by mouth daily.  . DULoxetine (CYMBALTA) 30 MG capsule Take 1 capsule (30 mg total) by mouth daily.  . famotidine (PEPCID) 20 MG tablet TAKE 1 TABLET BY MOUTH AT BEDTIME  . glucose blood (ACCU-CHEK GUIDE) test strip 1 each by Other route daily. Use as instructed to check blood sugars 1 time per day dx: e11.22  . hydrochlorothiazide (HYDRODIURIL) 25 MG tablet TAKE 1 TABLET BY MOUTH EVERY DAY   . ipratropium-albuterol (DUONEB) 0.5-2.5 (3) MG/3ML SOLN Take 3 mLs by nebulization every 6 (six) hours as needed. DX: J44.9 (Patient not taking: Reported on 09/01/2019)  . lidocaine (LIDODERM) 5 % Place 1 patch onto the skin daily. Remove & Discard patch within 12 hours or as directed by MD  . Magnesium 250 MG TABS Take 1 tablet by mouth daily.  . montelukast (SINGULAIR) 10 MG tablet TAKE 1 TABLET(10 MG) BY MOUTH AT BEDTIME  . oxyCODONE (ROXICODONE) 5 MG immediate release tablet Take 1 tablet (5 mg total) by mouth every 4 (four) hours as needed (as needed for severe chronic cough). prn for severe chronic cough (Patient not taking: Reported on 09/01/2019)  . pantoprazole (PROTONIX) 40 MG tablet TAKE 1 TABLET BY MOUTH EVERY DAY 30 TO 60 MINUTES BEFORE FIRST MEAL OF THE DAY  . rosuvastatin (CRESTOR) 20 MG tablet TAKE 1 TABLET BY MOUTH AT BEDTIME  . Semaglutide,0.25 or 0.5MG /DOS, (OZEMPIC, 0.25 OR 0.5 MG/DOSE,) 2 MG/1.5ML SOPN Inject 0.5 mg into the skin once a week.  . SYMBICORT 80-4.5 MCG/ACT inhaler INHALE 2 PUFFS BY MOUTH TWICE DAILY  . [DISCONTINUED] gabapentin (NEURONTIN) 300 MG capsule Take 1 capsule (300 mg total) by mouth 4 (four) times daily. (Patient taking differently: Take 300 mg by mouth. 2- 3 times daily)   No facility-administered encounter medications on file as of 09/13/2019.      Objective:   Goals Addressed            This Visit's Progress  Patient Stated   . I would like to apply for financial assistance for my Ozempic (pt-stated)       Current Barriers:  . Financial Barriers: patient has  insurance and reports copay for Ozempic is cost prohibitive at this time  Pharmacist Clinical Goal(s):  Marland Kitchen Over the next 30 days, patient will work with PharmD and providers to relieve medication access concerns  Interventions: . Comprehensive medication review completed; medication list updated in electronic medical record.  [name] by Novo[employer]ord[employer]: Patient meets income/NO out of  pocket spend criteria for this medication's patient assistance program. Reviewed application process. Patient will provide proof of income, out of pocket spend report, and will sign application. Will collaborate with primary care provider, Dr. [name] for their portion of application. Once completed, will submit to Nov[employer] patient assistance program. . Application has been submitted to [employer].  We should know the status of her application this week.  Will follow.  Patient Self Care Activities:  . Patient will provide necessary portions of application   Please see past updates related to this goal by clicking on the "Past Updates" button in the selected goal      . I would like to optimize my medication management of my chronic conditions (pt-stated)       Current Barriers:  . Diabetes: T2DM; most recent A1c 7.0% on 09/01/19 (A1c 7.0% has remained the same as labs from 06/07/19.  Patient is motivated to keep at goal.  A1c has been rising over the past year.)  . Current antihyperglycemic regimen: Ozempic 0.5mg -->increasing to Ozempic 1mg  weekly with patient assistance program. . denies hypoglycemic symptoms, denies hyperglycemic symptoms . Current meal patterns: o Breakfast/Lunch/Supper: encouraged patient to have protein, vegetable, low carb side o Snacks: increased sweets (had honey bun yesterday evening) o Drinks:mostly water, counseled patient to avoid sugary beverages, etc . Current exercise: n/a . Current blood glucose readings: Patient called to report fasting blood sugar of 153.  Patient states she had a cinnamon roll later after dinner.  Encouraged patient to watch sweets, increase water intake and increase activity.   Applauded patient for checking blood sugar as this will help [place] to optimize therapy. Encouraged patient to check Bgs once a week in the mornings before she injects. . Cardiovascular risk reduction: o Current hypertensive regimen: bisoprolol, HCTZ o Current  hyperlipidemia regimen: rosuvastatin 20mg  qHS  Pharmacist Clinical Goal(s):  Marland Kitchen Over the next 90 days, patient with work with PharmD and primary care provider to address needs related to optimized medication management of chronic conditions  Interventions: Call completed with patient on 09/13/19 . Comprehensive medication review performed, medication list updated in electronic medical record . Comprehensive medication review performed.  Reviewed medication fill history via insurance claims data confirming patient appears compliant with having his medications filled on time as prescribed by provider. . Reviewed & discussed the following diabetes-related information with patient: o Continue checking blood sugars as directed o Follow ADA recommended "diabetes-friendly" diet  (reviewed healthy snack/food options) o Discussed GLP-1 injection technique; Patient uses AccuCheck glucometer o Reviewed medication purpose/side effects-->patient denies adverse events o Continue taking all medications as prescribed by provider .   Patient Self Care Activities:  . Patient will check blood glucose once weekly , document, and provide at future appointments . Patient will focus on medication adherence by continued compliance with medications as prescribed. . Patient will take medications as prescribed . Patient will contact provider with any episodes of hypoglycemia . Patient will report  any questions or concerns to provider   Please see past updates related to this goal by clicking on the "Past Updates" button in the selected goal         Plan:   The care management team will reach out to the patient again over the next 2-4 weeks.  Regina Eck, PharmD, BCPS Clinical Pharmacist, [facility] [place]: [phone]

## 2019-09-14 NOTE — Patient Instructions (Signed)
Visit Information  Goals Addressed            This Visit's Progress     Patient Stated   . I would like to apply for financial assistance for my Ozempic (pt-stated)       Current Barriers:  . Financial Barriers: patient has  insurance and reports copay for Ozempic is cost prohibitive at this time  Pharmacist Clinical Goal(s):  Marland Kitchen Over the next 30 days, patient will work with PharmD and providers to relieve medication access concerns  Interventions: . Comprehensive medication review completed; medication list updated in electronic medical record.  [name] by NovoNordisk: Patient meets income/NO out of pocket spend criteria for this medication's patient assistance program. Reviewed application process. Patient will provide proof of income, out of pocket spend report, and will sign application. Will collaborate with primary care provider, Dr. [name] for their portion of application. Once completed, will submit to [employer] patient assistance program. . Application has been submitted to [employer].  We should know the status of her application this week.  Will follow.  Patient Self Care Activities:  . Patient will provide necessary portions of application   Please see past updates related to this goal by clicking on the "Past Updates" button in the selected goal      . I would like to optimize my medication management of my chronic conditions (pt-stated)       Current Barriers:  . Diabetes: T2DM; most recent A1c 7.0% on 09/01/19 (A1c 7.0% has remained the same as labs from 06/07/19.  Patient is motivated to keep at goal.  A1c has been rising over the past year.)  . Current antihyperglycemic regimen: Ozempic 0.5mg -->increasing to Ozempic 1mg  weekly with patient assistance program. . denies hypoglycemic symptoms, denies hyperglycemic symptoms . Current meal patterns: o Breakfast/Lunch/Supper: encouraged patient to have protein, vegetable, low carb side o Snacks: increased sweets  (had honey bun yesterday evening) o Drinks:mostly water, counseled patient to avoid sugary beverages, etc . Current exercise: n/a . Current blood glucose readings: Patient called to report fasting blood sugar of 153.  Patient states she had a cinnamon roll later after dinner.  Encouraged patient to watch sweets, increase water intake and increase activity.   Applauded patient for checking blood sugar as this will help [place] to optimize therapy. Encouraged patient to check Bgs once a week in the mornings before she injects. . Cardiovascular risk reduction: o Current hypertensive regimen: bisoprolol, HCTZ o Current hyperlipidemia regimen: rosuvastatin 20mg  qHS  Pharmacist Clinical Goal(s):  Marland Kitchen Over the next 90 days, patient with work with PharmD and primary care provider to address needs related to optimized medication management of chronic conditions  Interventions: Call completed with patient on 09/13/19 . Comprehensive medication review performed, medication list updated in electronic medical record . Comprehensive medication review performed.  Reviewed medication fill history via insurance claims data confirming patient appears compliant with having his medications filled on time as prescribed by provider. . Reviewed & discussed the following diabetes-related information with patient: o Continue checking blood sugars as directed o Follow ADA recommended "diabetes-friendly" diet  (reviewed healthy snack/food options) o Discussed GLP-1 injection technique; Patient uses AccuCheck glucometer o Reviewed medication purpose/side effects-->patient denies adverse events o Continue taking all medications as prescribed by provider .   Patient Self Care Activities:  . Patient will check blood glucose once weekly , document, and provide at future appointments . Patient will focus on medication adherence by continued compliance with medications as  prescribed. . Patient will take medications as  prescribed . Patient will contact provider with any episodes of hypoglycemia . Patient will report any questions or concerns to provider   Please see past updates related to this goal by clicking on the "Past Updates" button in the selected goal         The patient verbalized understanding of instructions provided today and declined a print copy of patient instruction materials.   The care management team will reach out to the patient again over the next 2-4 weeks.   Regina Eck, PharmD, BCPS Clinical Pharmacist, [facility] [place]: [phone]

## 2019-09-14 NOTE — Patient Instructions (Signed)
Visit Information  Goals Addressed            This Visit's Progress     Patient Stated   . I would like to apply for financial assistance for my Ozempic (pt-stated)       Current Barriers:  . Financial Barriers: patient has  insurance and reports copay for Ozempic is cost prohibitive at this time  Pharmacist Clinical Goal(s):  Marland Kitchen Over the next 30 days, patient will work with PharmD and providers to relieve medication access concerns  Interventions: . Comprehensive medication review completed; medication list updated in electronic medical record.  [name] by NovoNordisk: Patient meets income/NO out of pocket spend criteria for this medication's patient assistance program. Reviewed application process. Patient will provide proof of income, out of pocket spend report, and will sign application. Will collaborate with primary care provider, Dr. [name] for their portion of application. Once completed, will submit to Novo[employer]ord[employer] patient assistance program. . Application has been submitted.   Patient Self Care Activities:  . Patient will provide necessary portions of application   Initial goal documentation     . I would like to optimize my medication management of my chronic conditions (pt-stated)       Current Barriers:  . Diabetes: T2DM; most recent A1c 7.0% on 09/01/19 (A1c 7.0% has remained the same as labs from 06/07/19.  Patient is motivated to keep at goal.  A1c has been rising over the past year.)  . Current antihyperglycemic regimen: Ozempic 0.5mg -->increasing to Ozempic 1mg  weekly with patient assistance program. . denies hypoglycemic symptoms, denies hyperglycemic symptoms . Current meal patterns: o Breakfast/Lunch/Supper: encouraged patient to have protein, vegetable, low carb side o Snacks: increased sweets (had honey bun yesterday evening) o Drinks:mostly water, counseled patient to avoid sugary beverages, etc . Current exercise: n/a . Current blood glucose readings:  has not been checking blood sugars.  Encouraged patient to check Bgs once a week in the mornings before she injects  . Cardiovascular risk reduction: o Current hypertensive regimen: bisoprolol, HCTZ o Current hyperlipidemia regimen: rosuvastatin 20mg  qHS  Pharmacist Clinical Goal(s):  Marland Kitchen Over the next 90 days, patient with work with PharmD and primary care provider to address needs related to optimized medication management of chronic conditions  Interventions: Face to face visit completed with patient on 09/01/19 . Comprehensive medication review performed, medication list updated in electronic medical record . Comprehensive medication review performed.  Reviewed medication fill history via insurance claims data confirming patient appears compliant with having his medications filled on time as prescribed by provider. . Reviewed & discussed the following diabetes-related information with patient: o Continue checking blood sugars as directed o Follow ADA recommended "diabetes-friendly" diet  (reviewed healthy snack/food options) o Discussed GLP-1 injection technique; Patient uses AccuCheck glucometer o Reviewed medication purpose/side effects-->patient denies adverse events o Continue taking all medications as prescribed by provider .   Patient Self Care Activities:  . Patient will check blood glucose once weekly , document, and provide at future appointments . Patient will focus on medication adherence by continued compliance with medications as prescribed. . Patient will take medications as prescribed . Patient will contact provider with any episodes of hypoglycemia . Patient will report any questions or concerns to provider   Initial goal documentation        The patient verbalized understanding of instructions provided today and declined a print copy of patient instruction materials.   The care management team will reach out to the patient again  over the next 2-4 weeks.  Regina Eck, PharmD, BCPS Clinical Pharmacist, [facility] [place]: [phone]

## 2019-09-16 ENCOUNTER — Other Ambulatory Visit: Payer: Self-pay | Admitting: Nurse Practitioner

## 2019-09-16 ENCOUNTER — Other Ambulatory Visit: Payer: Self-pay | Admitting: Pharmacy Technician

## 2019-09-16 NOTE — Patient Outreach (Signed)
[facility]) Care Management  09/16/2019  [name] Jun 05, 1946 [id number]    Follow up call placed to [place] regarding patient assistance application(s) for [name] confirms patient has been approved as of 9/10 until 12/30/19. Medication to arrive at office in 10-14 business days, order# [id number]  Follow up:  Will route note to [employer] [facility] [place] and Sydnee Cabal B @ [facility] to inform  [name]. Chana Bode [place] Certified Pharmacy Technician [place] Management Direct [phone]

## 2019-09-21 ENCOUNTER — Telehealth: Payer: Self-pay

## 2019-09-23 ENCOUNTER — Ambulatory Visit: Payer: Self-pay

## 2019-09-23 DIAGNOSIS — E1122 Type 2 diabetes mellitus with diabetic chronic kidney disease: Secondary | ICD-10-CM

## 2019-09-23 DIAGNOSIS — I10 Essential (primary) hypertension: Secondary | ICD-10-CM | POA: Diagnosis not present

## 2019-09-23 DIAGNOSIS — N182 Chronic kidney disease, stage 2 (mild): Secondary | ICD-10-CM | POA: Diagnosis not present

## 2019-09-23 NOTE — Chronic Care Management (AMB) (Signed)
  Chronic Care Management   Outreach Note  09/23/2019 Name: [name] MRN: [id number] DOB: [birth year]  Referred by: [name], MD Reason for referral : Chronic Care Management (INITIAL CCM RNCM Telephone Outreach )   An unsuccessful telephone outreach was attempted today. The patient was referred to the case management team by [name] MD for assistance with chronic care management and care coordination.   Follow Up Plan: Telephone follow up appointment with care management team member scheduled for: 10/13/19    Barb Merino, RN, BSN, CCM Care Management Coordinator [place] [facility]  Direct Phone: [phone]

## 2019-09-27 ENCOUNTER — Telehealth: Payer: Self-pay

## 2019-09-27 ENCOUNTER — Ambulatory Visit: Payer: Self-pay

## 2019-09-27 ENCOUNTER — Other Ambulatory Visit: Payer: Self-pay | Admitting: Internal Medicine

## 2019-09-27 DIAGNOSIS — I10 Essential (primary) hypertension: Secondary | ICD-10-CM

## 2019-09-27 DIAGNOSIS — E1122 Type 2 diabetes mellitus with diabetic chronic kidney disease: Secondary | ICD-10-CM

## 2019-09-27 DIAGNOSIS — N182 Chronic kidney disease, stage 2 (mild): Secondary | ICD-10-CM

## 2019-09-27 NOTE — Chronic Care Management (AMB) (Signed)
  Chronic Care Management   Outreach Note  09/27/2019 Name: [name] MRN: [id number] DOB: [birth year]  Referred by: [name], MD Reason for referral : Care Coordination   CCM SW placed an unsuccessful outbound call to the patient to perform an SDOH (social determinants of health) screening call. SW left a HIPAA compliant voice message requesting a return call.  Follow Up Plan: SW will attempt a second call to the patient over the next week.  Daneen Schick, BSW, CDP Social Worker, Certified Dementia Practitioner [place] [facility] [place] [facility] [phone]

## 2019-10-04 ENCOUNTER — Telehealth: Payer: Self-pay

## 2019-10-04 ENCOUNTER — Ambulatory Visit: Payer: Self-pay

## 2019-10-04 DIAGNOSIS — E1122 Type 2 diabetes mellitus with diabetic chronic kidney disease: Secondary | ICD-10-CM

## 2019-10-04 DIAGNOSIS — I10 Essential (primary) hypertension: Secondary | ICD-10-CM

## 2019-10-04 DIAGNOSIS — N182 Chronic kidney disease, stage 2 (mild): Secondary | ICD-10-CM

## 2019-10-04 NOTE — Chronic Care Management (AMB) (Signed)
  Chronic Care Management   Outreach Note  10/04/2019 Name: [name] MRN: [id number] DOB: 05-Dec-1946  Referred by: [name], MD Reason for referral : Care Coordination   Second unsuccessful outreach to the patient to conduct an SDOH (social determinants of health) screen. HIPAA compliant voice message left requesting a return call.  Follow Up Plan: SW will follow up with the patient over the next 10 days.  Daneen Schick, BSW, CDP Social Worker, Certified Dementia Practitioner [place] [facility] [place] [facility] [phone]

## 2019-10-04 NOTE — Chronic Care Management (AMB) (Signed)
  Chronic Care Management   Social Work Note  10/04/2019 Name: [name] MRN: [id number] DOB: [birth year]  [name] is a 73 y.o. year old female who sees Tammy Chard, MD for primary care. The CCM team was consulted for assistance with chronic care management and care coordination.  I received an inbound call from the patient in response to previous voice message left by CCM SW. I discussed my role as a member of the chronic care management program. The patient denied resource needs at this time. SW encouraged the patient to contact SW directly for future resource needs.   Outpatient Encounter Medications as of 10/04/2019  Medication Sig  . Accu-Chek FastClix Lancets MISC Inject 1 each as directed daily. Use as directed to check blood sugars daily dx: e11.22  . albuterol (PROAIR HFA) 108 (90 Base) MCG/ACT inhaler Inhale 2 puffs into the lungs every 4 (four) hours as needed for wheezing or shortness of breath.  Marland Kitchen aspirin 81 MG tablet Take 81 mg by mouth daily.   . Biotin 10000 MCG TBDP Take by mouth.  . bisoprolol (ZEBETA) 5 MG tablet TAKE 1/2 TABLET BY MOUTH EVERY DAY  . BLACK CURRANT SEED OIL PO Take by mouth.  Marland Kitchen CALCIUM-MAGNESIUM-ZINC PO Take 1 capsule by mouth daily.  . Cholecalciferol (VITAMIN D3) 2000 UNITS TABS Take 2,000 Units by mouth daily.  . DULoxetine (CYMBALTA) 30 MG capsule Take 1 capsule (30 mg total) by mouth daily.  . famotidine (PEPCID) 20 MG tablet TAKE 1 TABLET BY MOUTH AT BEDTIME  . gabapentin (NEURONTIN) 300 MG capsule Take 1 capsule (300 mg total) by mouth 4 (four) times daily.  Marland Kitchen glucose blood (ACCU-CHEK GUIDE) test strip 1 each by Other route daily. Use as instructed to check blood sugars 1 time per day dx: e11.22  . hydrochlorothiazide (HYDRODIURIL) 25 MG tablet TAKE 1 TABLET BY MOUTH EVERY DAY  . ipratropium-albuterol (DUONEB) 0.5-2.5 (3) MG/3ML SOLN Take 3 mLs by nebulization every 6 (six) hours as needed. DX: J44.9 (Patient not taking: Reported on  09/01/2019)  . lidocaine (LIDODERM) 5 % Place 1 patch onto the skin daily. Remove & Discard patch within 12 hours or as directed by MD  . Magnesium 250 MG TABS Take 1 tablet by mouth daily.  . montelukast (SINGULAIR) 10 MG tablet TAKE 1 TABLET(10 MG) BY MOUTH AT BEDTIME  . oxyCODONE (ROXICODONE) 5 MG immediate release tablet Take 1 tablet (5 mg total) by mouth every 4 (four) hours as needed (as needed for severe chronic cough). prn for severe chronic cough (Patient not taking: Reported on 09/01/2019)  . pantoprazole (PROTONIX) 40 MG tablet TAKE 1 TABLET BY MOUTH EVERY DAY 30 TO 60 MINUTES BEFORE FIRST MEAL OF THE DAY  . rosuvastatin (CRESTOR) 20 MG tablet TAKE 1 TABLET BY MOUTH AT BEDTIME  . Semaglutide,0.25 or 0.5MG /DOS, (OZEMPIC, 0.25 OR 0.5 MG/DOSE,) 2 MG/1.5ML SOPN Inject 0.5 mg into the skin once a week.  . SYMBICORT 80-4.5 MCG/ACT inhaler INHALE 2 PUFFS BY MOUTH TWICE DAILY   No facility-administered encounter medications on file as of 10/04/2019.     Follow Up Plan: No further SW follow up planned at this time. The patient will remain active with embedded PharmD.  Daneen Schick, BSW, CDP Social Worker, Certified Dementia Practitioner [place] [facility] [place] [facility] [phone]

## 2019-10-05 ENCOUNTER — Ambulatory Visit: Payer: Self-pay

## 2019-10-05 ENCOUNTER — Telehealth: Payer: Self-pay

## 2019-10-05 DIAGNOSIS — N182 Chronic kidney disease, stage 2 (mild): Secondary | ICD-10-CM

## 2019-10-05 DIAGNOSIS — I10 Essential (primary) hypertension: Secondary | ICD-10-CM

## 2019-10-05 DIAGNOSIS — E1122 Type 2 diabetes mellitus with diabetic chronic kidney disease: Secondary | ICD-10-CM

## 2019-10-05 NOTE — Chronic Care Management (AMB) (Signed)
  Chronic Care Management   Outreach Note  10/05/2019 Name: [name] MRN: [id number] DOB: 11-22-1946  Referred by: [name], MD Reason for referral : Chronic Care Management (INITIAL CCM RNCM Telephone Outreach )   A second unsuccessful telephone outreach was attempted today. The patient was referred to the case management team for assistance with care management and care coordination.   Follow Up Plan: Telephone follow up appointment with care management team member scheduled for: 11/01/19  Barb Merino, RN, BSN, CCM Care Management Coordinator [place] [facility]  Direct Phone: [phone]

## 2019-10-06 ENCOUNTER — Ambulatory Visit: Payer: Self-pay | Admitting: Pharmacist

## 2019-10-06 ENCOUNTER — Ambulatory Visit (INDEPENDENT_AMBULATORY_CARE_PROVIDER_SITE_OTHER): Payer: Medicare Other | Admitting: Pharmacist

## 2019-10-06 DIAGNOSIS — E1122 Type 2 diabetes mellitus with diabetic chronic kidney disease: Secondary | ICD-10-CM | POA: Diagnosis not present

## 2019-10-06 DIAGNOSIS — N182 Chronic kidney disease, stage 2 (mild): Secondary | ICD-10-CM

## 2019-10-06 DIAGNOSIS — I10 Essential (primary) hypertension: Secondary | ICD-10-CM

## 2019-10-06 NOTE — Patient Instructions (Signed)
Visit Information  Goals Addressed            This Visit's Progress     Patient Stated   . I would like to optimize my medication management of my chronic conditions (pt-stated)       Current Barriers:  . Diabetes: T2DM; most recent A1c 7.0% on 09/01/19 (A1c 7.0% has remained the same as labs from 06/07/19.  Patient is motivated to keep at goal.  A1c has been rising over the past year.)  . Current antihyperglycemic regimen: Ozempic 1mg  weekly with patient assistance program. o Patient reports tolerating higher dose of Ozempic. Marland Kitchen denies hypoglycemic symptoms, denies hyperglycemic symptoms . Current meal patterns: o Breakfast/Lunch/Supper: encouraged patient to have protein, vegetable, low carb side o Snacks: increased sweets (had honey bun yesterday evening) o Drinks:mostly water, counseled patient to avoid sugary beverages, etc . Current exercise: n/a . Current blood glucose readings: Patient states her fasting BG readings are ~100.  Applauded patient for checking blood sugar as this will help [place] to optimize therapy. Encouraged patient to check Bgs once a week in the mornings before she injects. . Cardiovascular risk reduction: o Current hypertensive regimen: bisoprolol, HCTZ o Current hyperlipidemia regimen: rosuvastatin 20mg  qHS  Pharmacist Clinical Goal(s):  Marland Kitchen Over the next 90 days, patient with work with PharmD and primary care provider to address needs related to optimized medication management of chronic conditions  Interventions: Call completed with patient on 10/06/19 . Comprehensive medication review performed, medication list updated in electronic medical record . Comprehensive medication review performed.  Reviewed medication fill history via insurance claims data confirming patient appears compliant with having his medications filled on time as prescribed by provider. . Reviewed & discussed the following diabetes-related information with patient: o Continue checking blood  sugars as directed o Follow ADA recommended "diabetes-friendly" diet  (reviewed healthy snack/food options) o Discussed GLP-1 injection technique; Patient uses AccuCheck glucometer o Reviewed medication purpose/side effects-->patient denies adverse events o Continue taking all medications as prescribed by provider .   Patient Self Care Activities:  . Patient will check blood glucose once weekly , document, and provide at future appointments . Patient will focus on medication adherence by continued compliance with medications as prescribed. . Patient will take medications as prescribed . Patient will contact provider with any episodes of hypoglycemia . Patient will report any questions or concerns to provider   Please see past updates related to this goal by clicking on the "Past Updates" button in the selected goal         The patient verbalized understanding of instructions provided today and declined a print copy of patient instruction materials.   The care management team will reach out to the patient again over the next 30 days.   Regina Eck, PharmD, BCPS Clinical Pharmacist, [facility] [place]: [phone]

## 2019-10-06 NOTE — Progress Notes (Signed)
  Chronic Care Management   Outreach Note  10/06/2019 Name: [name] MRN: [id number] DOB: [birth year]  Referred by: [name], MD Reason for referral : Chronic Care Management   An unsuccessful telephone outreach was attempted today. The patient was referred to the case management team by for assistance with care management and care coordination.   Follow Up Plan: A HIPPA compliant phone message was left for the patient providing contact information and requesting a return call. Will f/u in 5-7 business days  Regina Eck, PharmD, [place] Pharmacist, [place]: [phone]

## 2019-10-06 NOTE — Progress Notes (Signed)
Chronic Care Management   Visit Note  10/06/2019 Name: [name] MRN: [id number] DOB: 1946-10-09  Referred by: [name], MD Reason for referral : Chronic Care Management   [name] is a 73 y.o. year old female who is a primary care patient of [name], MD. The CCM team was consulted for assistance with chronic disease management and care coordination needs related to DMII CKD Stage II  Review of patient status, including review of consultants reports, relevant laboratory and other test results, and collaboration with appropriate care team members and the patient's provider was performed as part of comprehensive patient evaluation and provision of chronic care management services.    I spoke with Ms. Falor today via telephone.  Advanced Directives Status: N See Care Plan and Vynca application for related entries.   Medications: Outpatient Encounter Medications as of 10/06/2019  Medication Sig  . Accu-Chek FastClix Lancets MISC Inject 1 each as directed daily. Use as directed to check blood sugars daily dx: e11.22  . albuterol (PROAIR HFA) 108 (90 Base) MCG/ACT inhaler Inhale 2 puffs into the lungs every 4 (four) hours as needed for wheezing or shortness of breath.  Marland Kitchen aspirin 81 MG tablet Take 81 mg by mouth daily.   . Biotin 10000 MCG TBDP Take by mouth.  . bisoprolol (ZEBETA) 5 MG tablet TAKE 1/2 TABLET BY MOUTH EVERY DAY  . BLACK CURRANT SEED OIL PO Take by mouth.  Marland Kitchen CALCIUM-MAGNESIUM-ZINC PO Take 1 capsule by mouth daily.  . Cholecalciferol (VITAMIN D3) 2000 UNITS TABS Take 2,000 Units by mouth daily.  . DULoxetine (CYMBALTA) 30 MG capsule Take 1 capsule (30 mg total) by mouth daily.  . famotidine (PEPCID) 20 MG tablet TAKE 1 TABLET BY MOUTH AT BEDTIME  . gabapentin (NEURONTIN) 300 MG capsule Take 1 capsule (300 mg total) by mouth 4 (four) times daily.  Marland Kitchen glucose blood (ACCU-CHEK GUIDE) test strip 1 each by Other route daily. Use as instructed to check  blood sugars 1 time per day dx: e11.22  . hydrochlorothiazide (HYDRODIURIL) 25 MG tablet TAKE 1 TABLET BY MOUTH EVERY DAY  . ipratropium-albuterol (DUONEB) 0.5-2.5 (3) MG/3ML SOLN Take 3 mLs by nebulization every 6 (six) hours as needed. DX: J44.9 (Patient not taking: Reported on 09/01/2019)  . lidocaine (LIDODERM) 5 % Place 1 patch onto the skin daily. Remove & Discard patch within 12 hours or as directed by MD  . Magnesium 250 MG TABS Take 1 tablet by mouth daily.  . montelukast (SINGULAIR) 10 MG tablet TAKE 1 TABLET(10 MG) BY MOUTH AT BEDTIME  . oxyCODONE (ROXICODONE) 5 MG immediate release tablet Take 1 tablet (5 mg total) by mouth every 4 (four) hours as needed (as needed for severe chronic cough). prn for severe chronic cough (Patient not taking: Reported on 09/01/2019)  . pantoprazole (PROTONIX) 40 MG tablet TAKE 1 TABLET BY MOUTH EVERY DAY 30 TO 60 MINUTES BEFORE FIRST MEAL OF THE DAY  . rosuvastatin (CRESTOR) 20 MG tablet TAKE 1 TABLET BY MOUTH AT BEDTIME  . Semaglutide,0.25 or 0.5MG /DOS, (OZEMPIC, 0.25 OR 0.5 MG/DOSE,) 2 MG/1.5ML SOPN Inject 0.5 mg into the skin once a week.  . SYMBICORT 80-4.5 MCG/ACT inhaler INHALE 2 PUFFS BY MOUTH TWICE DAILY   No facility-administered encounter medications on file as of 10/06/2019.      Objective:   Goals Addressed            This Visit's Progress     Patient Stated   . I  would like to optimize my medication management of my chronic conditions (pt-stated)       Current Barriers:  . Diabetes: T2DM; most recent A1c 7.0% on 09/01/19 (A1c 7.0% has remained the same as labs from 06/07/19.  Patient is motivated to keep at goal.  A1c has been rising over the past year.)  . Current antihyperglycemic regimen: Ozempic 1mg  weekly with patient assistance program. o Patient reports tolerating higher dose of Ozempic. Marland Kitchen denies hypoglycemic symptoms, denies hyperglycemic symptoms . Current meal patterns: o Breakfast/Lunch/Supper: encouraged patient to have  protein, vegetable, low carb side o Snacks: increased sweets (had honey bun yesterday evening) o Drinks:mostly water, counseled patient to avoid sugary beverages, etc . Current exercise: n/a . Current blood glucose readings: Patient states her fasting BG readings are ~100.  Applauded patient for checking blood sugar as this will help [place] to optimize therapy. Encouraged patient to check Bgs once a week in the mornings before she injects. . Cardiovascular risk reduction: o Current hypertensive regimen: bisoprolol, HCTZ o Current hyperlipidemia regimen: rosuvastatin 20mg  qHS  Pharmacist Clinical Goal(s):  Marland Kitchen Over the next 90 days, patient with work with PharmD and primary care provider to address needs related to optimized medication management of chronic conditions  Interventions: Call completed with patient on 10/06/19 . Comprehensive medication review performed, medication list updated in electronic medical record . Comprehensive medication review performed.  Reviewed medication fill history via insurance claims data confirming patient appears compliant with having his medications filled on time as prescribed by provider. . Reviewed & discussed the following diabetes-related information with patient: o Continue checking blood sugars as directed o Follow ADA recommended "diabetes-friendly" diet  (reviewed healthy snack/food options) o Discussed GLP-1 injection technique; Patient uses AccuCheck glucometer o Reviewed medication purpose/side effects-->patient denies adverse events o Continue taking all medications as prescribed by provider .   Patient Self Care Activities:  . Patient will check blood glucose once weekly , document, and provide at future appointments . Patient will focus on medication adherence by continued compliance with medications as prescribed. . Patient will take medications as prescribed . Patient will contact provider with any episodes of hypoglycemia . Patient will  report any questions or concerns to provider   Please see past updates related to this goal by clicking on the "Past Updates" button in the selected goal          Patient agreed to services and verbal consent obtained.   Plan:   The care management team will reach out to the patient again over the next 30 days.   Regina Eck, PharmD, BCPS Clinical Pharmacist, [facility] [place]: [phone]

## 2019-10-11 ENCOUNTER — Telehealth: Payer: Self-pay

## 2019-10-11 ENCOUNTER — Encounter: Payer: Self-pay | Admitting: Internal Medicine

## 2019-10-11 NOTE — Patient Instructions (Signed)
Visit Information  Goals Addressed            This Visit's Progress     Patient Stated   . COMPLETED: I would like to apply for financial assistance for my Ozempic (pt-stated)       Current Barriers:  . Financial Barriers: patient has  insurance and reports copay for Ozempic is cost prohibitive at this time  Pharmacist Clinical Goal(s):  Marland Kitchen Over the next 30 days, patient will work with PharmD and providers to relieve medication access concerns  Interventions: . Comprehensive medication review completed; medication list updated in electronic medical record.  [name] by [employer]: Patient meets income/NO out of pocket spend criteria for this medication's patient assistance program. Reviewed application process. Patient will provide proof of income, out of pocket spend report, and will sign application. Will collaborate with primary care provider, Dr. [name] for their portion of application. Once completed, will submit to [employer] patient assistance program. . Patient approved until 11/29/2019 for Ozempic  Patient Self Care Activities:  . Patient will provide necessary portions of application   Please see past updates related to this goal by clicking on the "Past Updates" button in the selected goal      . I would like to optimize my medication management of my chronic conditions (pt-stated)       Current Barriers:  . Diabetes: T2DM; most recent A1c 7.0% on 09/01/19 (A1c 7.0% has remained the same as labs from 06/07/19.  Patient is motivated to keep at goal.  A1c has been rising over the past year.)  . Current antihyperglycemic regimen: Ozempic 1mg  weekly with patient assistance program. o Patient reports tolerating higher dose of Ozempic 1mg  weekly . denies hypoglycemic symptoms, denies hyperglycemic symptoms . Current meal patterns: o Breakfast/Lunch/Supper: encouraged patient to have protein, vegetable, low carb side o Snacks: increased sweets (had honey bun yesterday  evening) o Drinks:mostly water, counseled patient to avoid sugary beverages, etc . Current exercise: n/a . Current blood glucose readings: Patient states her fasting BG readings are 90-100.  Applauded patient for checking blood sugar once weekly as this will help [place] to optimize therapy. Encouraged patient to check Bgs once a week in the mornings before she injects. . Cardiovascular risk reduction: o Current hypertensive regimen: bisoprolol, HCTZ o Current hyperlipidemia regimen: rosuvastatin 20mg  qHS  Pharmacist Clinical Goal(s):  Marland Kitchen Over the next 90 days, patient with work with PharmD and primary care provider to address needs related to optimized medication management of chronic conditions  Interventions: Call completed with patient on 10/06/19 . Comprehensive medication review performed, medication list updated in electronic medical record . Comprehensive medication review performed.  Reviewed medication fill history via insurance claims data confirming patient appears compliant with having his medications filled on time as prescribed by provider. . Reviewed & discussed the following diabetes-related information with patient: o Follow ADA recommended "diabetes-friendly" diet  (reviewed healthy snack/food options) o Discussed GLP-1 injection technique; Patient uses AccuCheck glucometer o Reviewed medication purpose/side effects-->patient denies adverse events   Patient Self Care Activities:  . Patient will check blood glucose once weekly , document, and provide at future appointments . Patient will focus on medication adherence by continued compliance with medications as prescribed. . Patient will take medications as prescribed . Patient will contact provider with any episodes of hypoglycemia . Patient will report any questions or concerns to provider   Please see past updates related to this goal by clicking on the "Past Updates" button in the  selected goal         The patient  verbalized understanding of instructions provided today and declined a print copy of patient instruction materials.   The care management team will reach out to the patient again over the next 6 weeks.  Regina Eck, PharmD, BCPS Clinical Pharmacist, [facility] [place]: [phone]

## 2019-10-11 NOTE — Progress Notes (Signed)
Chronic Care Management   Visit Note  10/06/2019 Name: [name] MRN: [id number] DOB: May 17, 1946  Referred by: [name], MD Reason for referral : Chronic Care Management   [name] is a 73 y.o. year old female who is a primary care patient of [name], MD. The CCM team was consulted for assistance with chronic disease management and care coordination needs related to HTN DMII  Review of patient status, including review of consultants reports, relevant laboratory and other test results, and collaboration with appropriate care team members and the patient's provider was performed as part of comprehensive patient evaluation and provision of chronic care management services.    I spoke with [name] by telephone today.  Advanced Directives Status: N See Care Plan and Vynca application for related entries.   Medications: Outpatient Encounter Medications as of 10/06/2019  Medication Sig  . Accu-Chek FastClix Lancets MISC Inject 1 each as directed daily. Use as directed to check blood sugars daily dx: e11.22  . albuterol (PROAIR HFA) 108 (90 Base) MCG/ACT inhaler Inhale 2 puffs into the lungs every 4 (four) hours as needed for wheezing or shortness of breath.  Marland Kitchen aspirin 81 MG tablet Take 81 mg by mouth daily.   . Biotin 10000 MCG TBDP Take by mouth.  . bisoprolol (ZEBETA) 5 MG tablet TAKE 1/2 TABLET BY MOUTH EVERY DAY  . CALCIUM-MAGNESIUM-ZINC PO Take 1 capsule by mouth daily.  . Cholecalciferol (VITAMIN D3) 2000 UNITS TABS Take 2,000 Units by mouth daily.  . DULoxetine (CYMBALTA) 30 MG capsule Take 1 capsule (30 mg total) by mouth daily.  . famotidine (PEPCID) 20 MG tablet TAKE 1 TABLET BY MOUTH AT BEDTIME  . gabapentin (NEURONTIN) 300 MG capsule Take 1 capsule (300 mg total) by mouth 4 (four) times daily.  Marland Kitchen glucose blood (ACCU-CHEK GUIDE) test strip 1 each by Other route daily. Use as instructed to check blood sugars 1 time per day dx: e11.22  .  hydrochlorothiazide (HYDRODIURIL) 25 MG tablet TAKE 1 TABLET BY MOUTH EVERY DAY  . ipratropium-albuterol (DUONEB) 0.5-2.5 (3) MG/3ML SOLN Take 3 mLs by nebulization every 6 (six) hours as needed. DX: J44.9 (Patient not taking: Reported on 09/01/2019)  . lidocaine (LIDODERM) 5 % Place 1 patch onto the skin daily. Remove & Discard patch within 12 hours or as directed by MD  . Magnesium 250 MG TABS Take 1 tablet by mouth daily.  . montelukast (SINGULAIR) 10 MG tablet TAKE 1 TABLET(10 MG) BY MOUTH AT BEDTIME  . oxyCODONE (ROXICODONE) 5 MG immediate release tablet Take 1 tablet (5 mg total) by mouth every 4 (four) hours as needed (as needed for severe chronic cough). prn for severe chronic cough (Patient not taking: Reported on 09/01/2019)  . pantoprazole (PROTONIX) 40 MG tablet TAKE 1 TABLET BY MOUTH EVERY DAY 30 TO 60 MINUTES BEFORE FIRST MEAL OF THE DAY  . rosuvastatin (CRESTOR) 20 MG tablet TAKE 1 TABLET BY MOUTH AT BEDTIME  . Semaglutide,0.25 or 0.5MG /DOS, (OZEMPIC, 0.25 OR 0.5 MG/DOSE,) 2 MG/1.5ML SOPN Inject 0.5 mg into the skin once a week.  . SYMBICORT 80-4.5 MCG/ACT inhaler INHALE 2 PUFFS BY MOUTH TWICE DAILY  . [DISCONTINUED] BLACK CURRANT SEED OIL PO Take by mouth.   No facility-administered encounter medications on file as of 10/06/2019.      Objective:   Goals Addressed            This Visit's Progress     Patient Stated   . COMPLETED: I  would like to apply for financial assistance for my Ozempic (pt-stated)       Current Barriers:  . Financial Barriers: patient has  insurance and reports copay for Ozempic is cost prohibitive at this time  Pharmacist Clinical Goal(s):  Marland Kitchen Over the next 30 days, patient will work with PharmD and providers to relieve medication access concerns  Interventions: . Comprehensive medication review completed; medication list updated in electronic medical record.  [name] by [employer]: Patient meets income/NO out of pocket spend criteria for this  medication's patient assistance program. Reviewed application process. Patient will provide proof of income, out of pocket spend report, and will sign application. Will collaborate with primary care provider, Dr. [name] for their portion of application. Once completed, will submit to [employer] patient assistance program. . Patient approved until 11/29/2019 for Ozempic  Patient Self Care Activities:  . Patient will provide necessary portions of application   Please see past updates related to this goal by clicking on the "Past Updates" button in the selected goal      . I would like to optimize my medication management of my chronic conditions (pt-stated)       Current Barriers:  . Diabetes: T2DM; most recent A1c 7.0% on 09/01/19 (A1c 7.0% has remained the same as labs from 06/07/19.  Patient is motivated to keep at goal.  A1c has been rising over the past year.)  . Current antihyperglycemic regimen: Ozempic 1mg  weekly with patient assistance program. o Patient reports tolerating higher dose of Ozempic 1mg  weekly . denies hypoglycemic symptoms, denies hyperglycemic symptoms . Current meal patterns: o Breakfast/Lunch/Supper: encouraged patient to have protein, vegetable, low carb side o Snacks: increased sweets (had honey bun yesterday evening) o Drinks:mostly water, counseled patient to avoid sugary beverages, etc . Current exercise: n/a . Current blood glucose readings: Patient states her fasting BG readings are 90-100.  Applauded patient for checking blood sugar once weekly as this will help [place] to optimize therapy. Encouraged patient to check Bgs once a week in the mornings before she injects. . Cardiovascular risk reduction: o Current hypertensive regimen: bisoprolol, HCTZ o Current hyperlipidemia regimen: rosuvastatin 20mg  qHS  Pharmacist Clinical Goal(s):  Marland Kitchen Over the next 90 days, patient with work with PharmD and primary care provider to address needs related to optimized  medication management of chronic conditions  Interventions: Call completed with patient on 10/06/19 . Comprehensive medication review performed, medication list updated in electronic medical record . Comprehensive medication review performed.  Reviewed medication fill history via insurance claims data confirming patient appears compliant with having his medications filled on time as prescribed by provider. . Reviewed & discussed the following diabetes-related information with patient: o Follow ADA recommended "diabetes-friendly" diet  (reviewed healthy snack/food options) o Discussed GLP-1 injection technique; Patient uses AccuCheck glucometer o Reviewed medication purpose/side effects-->patient denies adverse events   Patient Self Care Activities:  . Patient will check blood glucose once weekly , document, and provide at future appointments . Patient will focus on medication adherence by continued compliance with medications as prescribed. . Patient will take medications as prescribed . Patient will contact provider with any episodes of hypoglycemia . Patient will report any questions or concerns to provider   Please see past updates related to this goal by clicking on the "Past Updates" button in the selected goal         Plan:   The care management team will reach out to the patient again over the next 6 weeks.  Regina Eck, PharmD, BCPS Clinical Pharmacist, [facility] [place]: [phone]

## 2019-10-12 ENCOUNTER — Telehealth: Payer: Self-pay

## 2019-10-13 ENCOUNTER — Ambulatory Visit: Payer: Self-pay | Admitting: Internal Medicine

## 2019-10-14 MED ORDER — BUDESONIDE-FORMOTEROL FUMARATE 80-4.5 MCG/ACT IN AERO: 2.0000 | INHALATION_SPRAY | Freq: Two times a day (BID) | RESPIRATORY_TRACT | 0 refills | Status: DC

## 2019-10-19 ENCOUNTER — Telehealth: Payer: Self-pay | Admitting: Internal Medicine

## 2019-10-19 DIAGNOSIS — H10413 Chronic giant papillary conjunctivitis, bilateral: Secondary | ICD-10-CM | POA: Diagnosis not present

## 2019-10-19 DIAGNOSIS — H2513 Age-related nuclear cataract, bilateral: Secondary | ICD-10-CM | POA: Diagnosis not present

## 2019-10-19 DIAGNOSIS — E119 Type 2 diabetes mellitus without complications: Secondary | ICD-10-CM | POA: Diagnosis not present

## 2019-10-19 DIAGNOSIS — H40013 Open angle with borderline findings, low risk, bilateral: Secondary | ICD-10-CM | POA: Diagnosis not present

## 2019-10-19 DIAGNOSIS — H43812 Vitreous degeneration, left eye: Secondary | ICD-10-CM | POA: Diagnosis not present

## 2019-10-19 LAB — HM DIABETES EYE EXAM

## 2019-10-19 NOTE — Telephone Encounter (Signed)
Patient dropped off the patient assistance packet for Symbicort. Will fill out Dr. Gustavus Bryant portion and fax to [employer].   Will keep application in my purple follow up folder for 30 days before sending to scan.   Send MyChart message to patient to let her know that I received her forms and will update her.

## 2019-10-25 ENCOUNTER — Other Ambulatory Visit: Payer: Self-pay

## 2019-10-25 MED ORDER — ROSUVASTATIN CALCIUM 20 MG PO TABS: 20.0000 mg | ORAL_TABLET | Freq: Every day | ORAL | 1 refills | Status: DC

## 2019-10-27 ENCOUNTER — Encounter: Payer: Self-pay | Admitting: Internal Medicine

## 2019-11-01 ENCOUNTER — Telehealth: Payer: Self-pay

## 2019-11-01 ENCOUNTER — Ambulatory Visit: Payer: Self-pay

## 2019-11-01 DIAGNOSIS — N182 Chronic kidney disease, stage 2 (mild): Secondary | ICD-10-CM

## 2019-11-01 DIAGNOSIS — E1122 Type 2 diabetes mellitus with diabetic chronic kidney disease: Secondary | ICD-10-CM

## 2019-11-01 DIAGNOSIS — I10 Essential (primary) hypertension: Secondary | ICD-10-CM

## 2019-11-02 ENCOUNTER — Other Ambulatory Visit: Payer: Self-pay | Admitting: Internal Medicine

## 2019-11-02 MED ORDER — PANTOPRAZOLE SODIUM 40 MG PO TBEC: DELAYED_RELEASE_TABLET | ORAL | 0 refills | Status: DC

## 2019-11-02 NOTE — Chronic Care Management (AMB) (Signed)
  Chronic Care Management   Outreach Note  11/02/2019 Name: [name] MRN: [id number] DOB: October 26, 1946  Referred by: [name], MD Reason for referral : Chronic Care Management (#3 INITIAL CCM RNCM Telephone Outreach )   Third unsuccessful telephone outreach was attempted today. The patient was referred to the case management team for assistance with care management and care coordination. The patient's primary care provider has been notified of our unsuccessful attempts to make or maintain contact with the patient. The care management team is pleased to engage with this patient at any time in the future should he/she be interested in assistance from the care management team.   Follow Up Plan: A HIPPA compliant phone message was left for the patient providing contact information and requesting a return call.  The care management team is available to follow up with the patient after provider conversation with the patient regarding recommendation for care management engagement and subsequent re-referral to the care management team.   Barb Merino, RN, BSN, CCM Care Management Coordinator [place] [facility]  Direct Phone: [phone]

## 2019-11-03 NOTE — Telephone Encounter (Signed)
Attempted to get patient qualified for assistance with her Symbicort inhaler. Unfortunately she was denied. Most of the coupons for Symbicort are for patients with commercial insurance. She has Medicare.   MW, are you aware of any coupons for Symbicort for medicare patients? Please advise. Thanks!

## 2019-11-03 NOTE — Telephone Encounter (Signed)
No  - can try [employer] but no coupons available and they are stopping  the samples since went generic (generic is fine as made by the same company and may be tier one in the new year

## 2019-11-04 ENCOUNTER — Ambulatory Visit (INDEPENDENT_AMBULATORY_CARE_PROVIDER_SITE_OTHER): Payer: Medicare Other

## 2019-11-04 DIAGNOSIS — I10 Essential (primary) hypertension: Secondary | ICD-10-CM

## 2019-11-04 DIAGNOSIS — E1122 Type 2 diabetes mellitus with diabetic chronic kidney disease: Secondary | ICD-10-CM

## 2019-11-04 DIAGNOSIS — N182 Chronic kidney disease, stage 2 (mild): Secondary | ICD-10-CM | POA: Diagnosis not present

## 2019-11-04 NOTE — Telephone Encounter (Signed)
Can't do anything stronger than tramadol s ov with all meds in hand to establish a paper trail in case of an audit by 3rd parties - they are getting stricter and stricter re use of narcotics in all categories exspecially oxycodone.

## 2019-11-04 NOTE — Telephone Encounter (Addendum)
Received the following message from patient:   "The tickle, choking, dry heaving cough is back going on the third week. I have diligently been taking all meds and nothing is stopping it. I have even been taking 100 mg of Tramadol every 4-5 hours. It is a proven fact that only oxycodone stops the cough. It's been over 6 months since it's been prescribed for me. I NEED this. The cough is so very dibilitating. Please call in prescription. Thank you, Dr. [name]."  MW, please advise. Thanks!

## 2019-11-04 NOTE — Telephone Encounter (Signed)
Replied to patient's message and asked if she would like to apply for patient assistance.  Also let her know that in the new year the generic might be in a lower tier for your insurance company.

## 2019-11-05 NOTE — Telephone Encounter (Signed)
Please see pt email:   Thank you but Dr. Joya Gaskins prescribed Tramadol, 60 tablets 50mg  with 5 refills left. I take 2 at a time and it does not stop the tickle at all. Although insurance companies may be strict on narcotics, it's been over 7 months since it was prescribed for me which can hardly be considered over dosing and especially since the dosage is just 5mg . Every time I get this horrible tickle, Dr. [name] goes over my med calendar with me and nothing ever changes...ever. And we both know that oxy is the ONLY thing that stops the tickle. Not prednisone or anything else. And I get charged for the office visit. So thank you but there is no point to coming in for a visit just to repeat the same thing for over 5 years. The tickle has its seasons which is not very often. But when it comes, it is a nightmare that has to run its course. And I suffer miserably in the meantime. I just don't understand why I can't get a little relief once a year by being prescribed the only thing that works. Sorry I'm such a bother.

## 2019-11-05 NOTE — Chronic Care Management (AMB) (Signed)
  Chronic Care Management   Outreach Note  11/05/2019 Name: [name] MRN: [id number] DOB: [birth year]  Referred by: [name], MD Reason for referral : Chronic Care Management (CCM RNCM Telephone Follow up )   Inbound call received from patient with voice message stating she is returning CCM RN call. Placed Fourth unsuccessful telephone outreach was attempted today. The patient was referred to the case management team for assistance with care management and care coordination. The patient's primary care provider has been notified of our unsuccessful attempts to make or maintain contact with the patient. The care management team is pleased to engage with this patient at any time in the future should he/she be interested in assistance from the care management team.   Follow Up Plan: A HIPPA compliant phone message was left for the patient providing contact information and requesting a return call.  The care management team is available to follow up with the patient after provider conversation with the patient regarding recommendation for care management engagement and subsequent re-referral to the care management team.   Barb Merino, RN, BSN, CCM Care Management Coordinator [place] [facility]  Direct Phone: [phone]

## 2019-11-24 ENCOUNTER — Telehealth: Payer: Self-pay

## 2019-11-29 ENCOUNTER — Other Ambulatory Visit: Payer: Self-pay | Admitting: Internal Medicine

## 2019-11-29 MED ORDER — PANTOPRAZOLE SODIUM 40 MG PO TBEC: DELAYED_RELEASE_TABLET | ORAL | 0 refills | Status: DC

## 2019-12-02 ENCOUNTER — Ambulatory Visit: Payer: Self-pay | Admitting: Pharmacist

## 2019-12-02 DIAGNOSIS — J449 Chronic obstructive pulmonary disease, unspecified: Secondary | ICD-10-CM

## 2019-12-02 DIAGNOSIS — E1122 Type 2 diabetes mellitus with diabetic chronic kidney disease: Secondary | ICD-10-CM

## 2019-12-02 DIAGNOSIS — N182 Chronic kidney disease, stage 2 (mild): Secondary | ICD-10-CM

## 2019-12-02 NOTE — Progress Notes (Signed)
  Chronic Care Management   Outreach Note  12/02/2019 Name: ALVEDA VANHORNE MRN: [id number] DOB: [birth year]  Referred by: [name], MD Reason for referral : Chronic Care Management   An unsuccessful telephone outreach was attempted today. The patient was referred to the case management team by for assistance with care management and care coordination.   Follow Up Plan: The care management team will reach out to the patient again over the next 14 days.   SIGNATURE Regina Eck, PharmD, BCPS Clinical Pharmacist, [facility] [place]: [phone]

## 2019-12-08 ENCOUNTER — Ambulatory Visit (INDEPENDENT_AMBULATORY_CARE_PROVIDER_SITE_OTHER): Payer: Medicare Other | Admitting: Pharmacist

## 2019-12-08 DIAGNOSIS — E1122 Type 2 diabetes mellitus with diabetic chronic kidney disease: Secondary | ICD-10-CM

## 2019-12-08 DIAGNOSIS — N182 Chronic kidney disease, stage 2 (mild): Secondary | ICD-10-CM

## 2019-12-08 NOTE — Patient Instructions (Signed)
Visit Information  Goals Addressed            This Visit's Progress     Patient Stated   . I would like to optimize my medication management of my chronic conditions (pt-stated)       Current Barriers:  . Diabetes: T2DM; most recent A1c 7.0% on 09/01/19 (A1c 7.0% has remained the same as labs from 06/07/19.  Patient is motivated to keep at goal.  A1c has been rising over the past year.)  . Current antihyperglycemic regimen: Ozempic 1mg  weekly with patient assistance program. o Patient reports tolerating higher dose of Ozempic 1mg  weekly . denies hypoglycemic symptoms, denies hyperglycemic symptoms . Current meal patterns: o Breakfast/Lunch/Supper: encouraged patient to have protein, vegetable, low carb side o Snacks: increased sweets (had honey bun yesterday evening) o Drinks:mostly water, counseled patient to avoid sugary beverages, etc . Current exercise: n/a . Current blood glucose readings: Patient states her fasting BG readings are 80-100. Patient continues to monitor blood glucose readings weekly before she checks. Encouraged patient for her efforts to monitor her DM management. . Cardiovascular risk reduction: o Current hypertensive regimen: bisoprolol, HCTZ o Current hyperlipidemia regimen: rosuvastatin 20mg  qHS  Pharmacist Clinical Goal(s):  Marland Kitchen Over the next 90 days, patient with work with PharmD and primary care provider to address needs related to optimized medication management of chronic conditions  Interventions: Call completed with patient on 12/08/19 . Comprehensive medication review performed, medication list updated in electronic medical record . Comprehensive medication review performed.  Reviewed medication fill history via insurance claims data confirming patient appears compliant with having his medications filled on time as prescribed by provider. . Reviewed & discussed the following diabetes-related information with patient: o Follow ADA recommended  "diabetes-friendly" diet  (reviewed healthy snack/food options) o Discussed GLP-1 injection technique; Patient uses AccuCheck glucometer o Reviewed medication purpose/side effects-->patient denies adverse events   Patient Self Care Activities:  . Patient will check blood glucose once weekly , document, and provide at future appointments . Patient will focus on medication adherence by continued compliance with medications as prescribed. . Patient will take medications as prescribed . Patient will contact provider with any episodes of hypoglycemia . Patient will report any questions or concerns to provider   Please see past updates related to this goal by clicking on the "Past Updates" button in the selected goal         The patient verbalized understanding of instructions provided today and declined a print copy of patient instruction materials.   The care management team will reach out to the patient again over the next 60 days.   Drexel Iha, PharmD PGY2 Ambulatory Care Pharmacy Resident  Regina Eck, PharmD, BCPS Clinical Pharmacist, [facility] [place]: [phone]

## 2019-12-08 NOTE — Chronic Care Management (AMB) (Signed)
Chronic Care Management    12/08/2019 Name: [name] MRN: [id number] DOB: [birth year]  Referred by: Tammy Chard, MD Reason for referral : Chronic Care Management   [name] is a 73 y.o. year old female who is a primary care patient of Tammy Chard, MD. The CCM team was consulted for assistance with chronic disease management and care coordination needs related to DMII  Review of patient status, including review of consultants reports, relevant laboratory and other test results, and collaboration with appropriate care team members and the patient's provider was performed as part of comprehensive patient evaluation and provision of chronic care management services.    Medications: Outpatient Encounter Medications as of 12/08/2019  Medication Sig  . Accu-Chek FastClix Lancets MISC Inject 1 each as directed daily. Use as directed to check blood sugars daily dx: e11.22  . albuterol (PROAIR HFA) 108 (90 Base) MCG/ACT inhaler Inhale 2 puffs into the lungs every 4 (four) hours as needed for wheezing or shortness of breath.  Marland Kitchen aspirin 81 MG tablet Take 81 mg by mouth daily.   . Biotin 10000 MCG TBDP Take by mouth.  . bisoprolol (ZEBETA) 5 MG tablet TAKE 1/2 TABLET BY MOUTH EVERY DAY  . budesonide-formoterol (SYMBICORT) 80-4.5 MCG/ACT inhaler Inhale 2 puffs into the lungs 2 (two) times daily.  Marland Kitchen CALCIUM-MAGNESIUM-ZINC PO Take 1 capsule by mouth daily.  . Cholecalciferol (VITAMIN D3) 2000 UNITS TABS Take 2,000 Units by mouth daily.  . DULoxetine (CYMBALTA) 30 MG capsule Take 1 capsule (30 mg total) by mouth daily.  . famotidine (PEPCID) 20 MG tablet TAKE 1 TABLET BY MOUTH AT BEDTIME  . gabapentin (NEURONTIN) 300 MG capsule Take 1 capsule (300 mg total) by mouth 4 (four) times daily.  Marland Kitchen glucose blood (ACCU-CHEK GUIDE) test strip 1 each by Other route daily. Use as instructed to check blood sugars 1 time per day dx: e11.22  . hydrochlorothiazide (HYDRODIURIL) 25 MG tablet TAKE 1  TABLET BY MOUTH EVERY DAY  . ipratropium-albuterol (DUONEB) 0.5-2.5 (3) MG/3ML SOLN Take 3 mLs by nebulization every 6 (six) hours as needed. DX: J44.9 (Patient not taking: Reported on 09/01/2019)  . lidocaine (LIDODERM) 5 % Place 1 patch onto the skin daily. Remove & Discard patch within 12 hours or as directed by MD  . Magnesium 250 MG TABS Take 1 tablet by mouth daily.  . montelukast (SINGULAIR) 10 MG tablet TAKE 1 TABLET(10 MG) BY MOUTH AT BEDTIME  . oxyCODONE (ROXICODONE) 5 MG immediate release tablet Take 1 tablet (5 mg total) by mouth every 4 (four) hours as needed (as needed for severe chronic cough). prn for severe chronic cough (Patient not taking: Reported on 09/01/2019)  . pantoprazole (PROTONIX) 40 MG tablet TAKE 1 TABLET BY MOUTH EVERY DAY 30 TO 60 MINUTES BEFORE FIRST MEAL OF THE DAY  . rosuvastatin (CRESTOR) 20 MG tablet Take 1 tablet (20 mg total) by mouth at bedtime.  . Semaglutide,0.25 or 0.5MG /DOS, (OZEMPIC, 0.25 OR 0.5 MG/DOSE,) 2 MG/1.5ML SOPN Inject 0.5 mg into the skin once a week.  . SYMBICORT 80-4.5 MCG/ACT inhaler INHALE 2 PUFFS BY MOUTH TWICE DAILY   No facility-administered encounter medications on file as of 12/08/2019.      Objective:   Goals Addressed            This Visit's Progress     Patient Stated   . I would like to optimize my medication management of my chronic conditions (pt-stated)       Current  Barriers:  . Diabetes: T2DM; most recent A1c 7.0% on 09/01/19 (A1c 7.0% has remained the same as labs from 06/07/19.  Patient is motivated to keep at goal.  A1c has been rising over the past year.)  . Current antihyperglycemic regimen: Ozempic 1mg  weekly with patient assistance program. o Patient reports tolerating higher dose of Ozempic 1mg  weekly . denies hypoglycemic symptoms, denies hyperglycemic symptoms . Current meal patterns: o Breakfast/Lunch/Supper: encouraged patient to have protein, vegetable, low carb side o Snacks: increased sweets (had honey bun  yesterday evening) o Drinks:mostly water, counseled patient to avoid sugary beverages, etc . Current exercise: n/a . Current blood glucose readings: Patient states her fasting BG readings are 80-100. Patient continues to monitor blood glucose readings weekly before she checks. Encouraged patient for her efforts to monitor her DM management. . Cardiovascular risk reduction: o Current hypertensive regimen: bisoprolol, HCTZ o Current hyperlipidemia regimen: rosuvastatin 20mg  qHS  Pharmacist Clinical Goal(s):  Marland Kitchen Over the next 90 days, patient with work with PharmD and primary care provider to address needs related to optimized medication management of chronic conditions  Interventions: Call completed with patient on 12/08/19 . Comprehensive medication review performed, medication list updated in electronic medical record . Comprehensive medication review performed.  Reviewed medication fill history via insurance claims data confirming patient appears compliant with having his medications filled on time as prescribed by provider. . Reviewed & discussed the following diabetes-related information with patient: o Follow ADA recommended "diabetes-friendly" diet  (reviewed healthy snack/food options) o Discussed GLP-1 injection technique; Patient uses AccuCheck glucometer o Reviewed medication purpose/side effects-->patient denies adverse events   Patient Self Care Activities:  . Patient will check blood glucose once weekly , document, and provide at future appointments . Patient will focus on medication adherence by continued compliance with medications as prescribed. . Patient will take medications as prescribed . Patient will contact provider with any episodes of hypoglycemia . Patient will report any questions or concerns to provider   Please see past updates related to this goal by clicking on the "Past Updates" button in the selected goal          Plan:   The care management team will  reach out to the patient again over the next 60 days.   Drexel Iha, PharmD PGY2 Ambulatory Care Pharmacy Resident  Regina Eck, PharmD, BCPS Clinical Pharmacist, [facility] [place]: [phone]

## 2019-12-10 NOTE — Progress Notes (Addendum)
Chronic Care Management   Visit Note  12/08/2019 Name: [name] MRN: [id number] DOB: 24-Aug-1946  Referred by: [name], MD Reason for referral : Chronic Care Management   [name] is a 73 y.o. year old female who is a primary care patient of [name], MD. The CCM team was consulted for assistance with chronic disease management and care coordination needs related to DMII  Review of patient status, including review of consultants reports, relevant laboratory and other test results, and collaboration with appropriate care team members and the patient's provider was performed as part of comprehensive patient evaluation and provision of chronic care management services.    I spoke with [name] by telephone today.  Medications: Outpatient Encounter Medications as of 12/08/2019  Medication Sig  . Accu-Chek FastClix Lancets MISC Inject 1 each as directed daily. Use as directed to check blood sugars daily dx: e11.22  . albuterol (PROAIR HFA) 108 (90 Base) MCG/ACT inhaler Inhale 2 puffs into the lungs every 4 (four) hours as needed for wheezing or shortness of breath.  Marland Kitchen aspirin 81 MG tablet Take 81 mg by mouth daily.   . Biotin 10000 MCG TBDP Take by mouth.  . bisoprolol (ZEBETA) 5 MG tablet TAKE 1/2 TABLET BY MOUTH EVERY DAY  . budesonide-formoterol (SYMBICORT) 80-4.5 MCG/ACT inhaler Inhale 2 puffs into the lungs 2 (two) times daily.  Marland Kitchen CALCIUM-MAGNESIUM-ZINC PO Take 1 capsule by mouth daily.  . Cholecalciferol (VITAMIN D3) 2000 UNITS TABS Take 2,000 Units by mouth daily.  . DULoxetine (CYMBALTA) 30 MG capsule Take 1 capsule (30 mg total) by mouth daily.  . famotidine (PEPCID) 20 MG tablet TAKE 1 TABLET BY MOUTH AT BEDTIME  . gabapentin (NEURONTIN) 300 MG capsule Take 1 capsule (300 mg total) by mouth 4 (four) times daily.  Marland Kitchen glucose blood (ACCU-CHEK GUIDE) test strip 1 each by Other route daily. Use as instructed to check blood sugars 1 time per day dx: e11.22   . hydrochlorothiazide (HYDRODIURIL) 25 MG tablet TAKE 1 TABLET BY MOUTH EVERY DAY  . ipratropium-albuterol (DUONEB) 0.5-2.5 (3) MG/3ML SOLN Take 3 mLs by nebulization every 6 (six) hours as needed. DX: J44.9 (Patient not taking: Reported on 09/01/2019)  . lidocaine (LIDODERM) 5 % Place 1 patch onto the skin daily. Remove & Discard patch within 12 hours or as directed by MD  . Magnesium 250 MG TABS Take 1 tablet by mouth daily.  . montelukast (SINGULAIR) 10 MG tablet TAKE 1 TABLET(10 MG) BY MOUTH AT BEDTIME  . oxyCODONE (ROXICODONE) 5 MG immediate release tablet Take 1 tablet (5 mg total) by mouth every 4 (four) hours as needed (as needed for severe chronic cough). prn for severe chronic cough (Patient not taking: Reported on 09/01/2019)  . pantoprazole (PROTONIX) 40 MG tablet TAKE 1 TABLET BY MOUTH EVERY DAY 30 TO 60 MINUTES BEFORE FIRST MEAL OF THE DAY  . rosuvastatin (CRESTOR) 20 MG tablet Take 1 tablet (20 mg total) by mouth at bedtime.  . Semaglutide,0.25 or 0.5MG /DOS, (OZEMPIC, 0.25 OR 0.5 MG/DOSE,) 2 MG/1.5ML SOPN Inject 0.5 mg into the skin once a week.  . SYMBICORT 80-4.5 MCG/ACT inhaler INHALE 2 PUFFS BY MOUTH TWICE DAILY   No facility-administered encounter medications on file as of 12/08/2019.     Objective:   Goals Addressed            This Visit's Progress     Patient Stated   . I would like to apply for financial assistance for my  Ozempic (pt-stated)       Current Barriers:  . Financial Barriers: patient has [employer] and reports copay for Ozempic is cost prohibitive at this time.  This medication is necessary to optimally manage her chronic conditions (diabetes)  Pharmacist Clinical Goal(s):  Marland Kitchen Over the next 30 days, patient will work with PharmD and providers to relieve medication access concerns  Interventions: . Comprehensive medication review completed; medication list updated in electronic medical record.  [name] by [employer]: Patient meets income/NO out  of pocket spend criteria for this medication's patient assistance program. Reviewed application process. Patient will provide proof of income, out of pocket spend report, and will sign application. Will collaborate with primary care provider, Dr. [name] for their portion of application. Once completed, will submit to [employer] patient assistance program. . Patient approved until 11/29/2019 for Ozempic.  Will begin 4696 application process  Patient Self Care Activities:  . Patient will provide necessary portions of application   Please see past updates related to this goal by clicking on the "Past Updates" button in the selected goal      . I would like to optimize my medication management of my chronic conditions (pt-stated)       Current Barriers:  . Diabetes: T2DM; most recent A1c 7.0% on 09/01/19 (A1c 7.0% has remained the same as labs from 06/07/19.  Patient is motivated to keep at goal.  A1c has been rising over the past year.)  . Current antihyperglycemic regimen: Ozempic 1mg  weekly with patient assistance program. o Patient reports tolerating higher dose of Ozempic 1mg  weekly . denies hypoglycemic symptoms, denies hyperglycemic symptoms . Current meal patterns: o Breakfast/Lunch/Supper: encouraged patient to have protein, vegetable, low carb side o Snacks: increased sweets (had honey bun yesterday evening) o Drinks:mostly water, counseled patient to avoid sugary beverages, etc . Current exercise: n/a . Current blood glucose readings: Patient states her fasting BG readings are 80-100. Her FBG this AM was 106.  Patient continues to monitor blood glucose readings weekly before she injects. Encouraged patient for her efforts to monitor her DM management. . Cardiovascular risk reduction: o Current hypertensive regimen: bisoprolol, HCTZ o Current hyperlipidemia regimen: rosuvastatin 20mg  qHS  Pharmacist Clinical Goal(s):  Marland Kitchen Over the next 90 days, patient with work with PharmD and  primary care provider to address needs related to optimized medication management of chronic conditions  Interventions: Call completed with patient on 12/08/19 . Comprehensive medication review performed, medication list updated in electronic medical record . Comprehensive medication review performed.  Reviewed medication fill history via insurance claims data confirming patient appears compliant with having his medications filled on time as prescribed by provider. . Reviewed & discussed the following diabetes-related information with patient: o Follow ADA recommended "diabetes-friendly" diet  (reviewed healthy snack/food options) o Discussed GLP-1 injection technique; Patient uses AccuCheck glucometer o Reviewed medication purpose/side effects-->patient denies adverse events   Patient Self Care Activities:  . Patient will check blood glucose once weekly , document, and provide at future appointments . Patient will focus on medication adherence by continued compliance with medications as prescribed. . Patient will take medications as prescribed . Patient will contact provider with any episodes of hypoglycemia . Patient will report any questions or concerns to provider   Please see past updates related to this goal by clicking on the "Past Updates" button in the selected goal          The care management team will reach out to the patient again over  the next 6-8 weeks  Provider Signature Regina Eck, PharmD, BCPS Clinical Pharmacist, [facility] [place]: [phone]

## 2019-12-14 ENCOUNTER — Other Ambulatory Visit: Payer: Self-pay | Admitting: Pharmacy Technician

## 2019-12-14 NOTE — Patient Outreach (Signed)
[facility]) Care Management  12/14/2019  KAMEREN BAADE [birth year] [id number]                                       Medication Assistance Referral  Referral From: [facility] Embedded RPh Jenne Pane.   Medication/Company: [name] / [employer] Patient application portion:  Education officer, museum portion: Faxed  to Dr. Johnnye Lana Provider address/fax verified via: Office website   Follow up:  Will follow up with patient in 10-14 business days to confirm application(s) have been received.  Maud Deed Chana Bode Elizabeth Certified Pharmacy Technician [place] [facility] Direct Dial:[phone]

## 2019-12-16 ENCOUNTER — Ambulatory Visit (INDEPENDENT_AMBULATORY_CARE_PROVIDER_SITE_OTHER): Payer: Medicare Other | Admitting: Internal Medicine

## 2019-12-16 ENCOUNTER — Other Ambulatory Visit: Payer: Self-pay

## 2019-12-16 ENCOUNTER — Encounter: Payer: Self-pay | Admitting: Internal Medicine

## 2019-12-16 DIAGNOSIS — R059 Cough, unspecified: Secondary | ICD-10-CM

## 2019-12-16 DIAGNOSIS — J449 Chronic obstructive pulmonary disease, unspecified: Secondary | ICD-10-CM | POA: Diagnosis not present

## 2019-12-16 DIAGNOSIS — R05 Cough: Secondary | ICD-10-CM

## 2019-12-16 MED ORDER — OXYCODONE HCL 5 MG PO TABS: 5.0000 mg | ORAL_TABLET | ORAL | 0 refills | Status: DC | PRN

## 2019-12-16 MED ORDER — PREDNISONE 10 MG PO TABS: ORAL_TABLET | ORAL | 0 refills | Status: DC

## 2019-12-16 MED ORDER — BISOPROLOL FUMARATE 5 MG PO TABS: 2.5000 mg | ORAL_TABLET | Freq: Every day | ORAL | 0 refills | Status: DC

## 2019-12-16 MED ORDER — AZITHROMYCIN 250 MG PO TABS: ORAL_TABLET | ORAL | 0 refills | Status: DC

## 2019-12-16 NOTE — Assessment & Plan Note (Addendum)
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 - try off cozar 11/03/2014 >>> - neurontin 100 tid rx 11/30/14 > improved 01/03/2015 > changed to lyrica 04/2015 but not taking consistently as of 01/30/2016  - sinus CT 02/17/15 > neg  - FENO  04/14/17 =  14  - rechallenged with gabapentin 300 qid 07/22/2018 and if not better > refer to Dr Joya Gaskins at [facility] voice center > did not go, referred again 01/07/2019 > see eval 03/02/2019 rec speech therapy/tramadol (pt refused latter)    Of the three most common causes of  Sub-acute / recurrent or chronic cough, only one (GERD)  can actually contribute to/ trigger  the other two (asthma and post nasal drip syndrome)  and perpetuate the cylce of cough.  While not intuitively obvious, many patients with chronic low grade reflux do not cough until there is a primary insult that disturbs the protective epithelial barrier and exposes sensitive nerve endings.   This is typically viral but can due to PNDS and  either may apply here.   The point is that once this occurs, it is difficult to eliminate the cycle  using anything but a maximally effective acid suppression regimen at least in the short run, accompanied by an appropriate diet to address non acid GERD and control / eliminate the cough itself for at least 3 days with roxicodone 5 mg 1-2 q 4 h prn and increased gabapentin to 300 qid and also added 6 days of Prednisone in case of component of Th-2 driven upper or lower airways inflammation (if cough responds short term only to relapse befor return while will on rx for uacs that would point to allergic rhinitis/ asthma or eos bronchitis) .   Each maintenance medication was reviewed in detail including most importantly the difference between maintenance and as needed and under what circumstances the prns are to be used.  Please see AVS for specific  Instructions which are unique to this  visit and I personally typed out  which were reviewed in detail over the phone with the patient and a copy provided via my chart

## 2019-12-16 NOTE — Progress Notes (Signed)
Subjective:   Patient ID: [name], female    DOB: August 16, 1946    MRN: [id number]    Brief patient profile:  69  yobf mother of a PA in [employer] quit smoking 2007 with chronic cough x 2011 self referred for evaluation of persistent cough in setting of technically GOLD II copd dx 02/2014 with only mild obstructive pattern - has noted since high school a tendency to daily  tickle in throat causing intermittent cough waxing and waning ever since    History of Present Illness  10/19/2014 1st [name] Pulmonary office visit/ [name]   Chief Complaint  Patient presents with  . Pulmonary Consult    Former pt of Dr. Annamaria Boots. Pt c/o increased cough x 3 wks. Cough is prod with moderate, thick, light yellow sputum.  She coughs until loses urinary continence.  Not sleeping well due to cough.   remembers having tendency to throat tickle in high school  but completely resolved by graduation  then recurred in her 62s and resolved with codeine then recurred in 2011  but bad to worse x 3 weeks assoc with sensation of choking, mucus is thick light yellow.  acei stopped about a month prior to OV  Replaced by losartan  Inhalers make it worse, esp dpi  Kouffman Reflux v Neurogenic Cough Differentiator Reflux Comments  Do you awaken from a sound sleep coughing violently?                            With trouble breathing? Yes   Do you have choking episodes when you cannot  Get enough air, gasping for air ?              Yes   Do you usually cough when you lie down into  The bed, or when you just lie down to rest ?                          Yes   Do you usually cough after meals or eating?         maybe   Do you cough when (or after) you bend over?    No    GERD SCORE     Kouffman Reflux v Neurogenic Cough Differentiator Neurogenic   Do you more-or-less cough all day long? yes   Does change of temperature make you cough? no   Does laughing or chuckling cause you to cough? no   Do fumes (perfume, automobile fumes,  burned  Toast, etc.,) cause you to cough ?      Not aware   Does speaking, singing, or talking on the phone cause you to cough   ?               sometimes   Neurogenic/Airway score      rec  Max rx gerd/prn tramadol        09/15/2017  Transition of care  f/u ov/[name] re: COPD II s/p aecopd Chief Complaint  Patient presents with  . Hospitalization Follow-up    Breathing has improved some, but not back at her normal baseline. She states her cough woke her up this am and had some bloody nasal d/c.  She has been coughing up some thick, yellow sputum.  She is using   walking all over [place] fine,  Flew back to [place]  on a commercial jet and short of breath  walking  from gate [place] then by 2 days later in ER due to severe sob / tightness across upper back >> albuterol relived the discomfort w/in 5 min but did not have her rescue so had to call 911 (proair listed on med calendar she just got from me 08/19/17 under written action plan) - says was so short of breath at that point could not have used saba anyway. Not following action plan either re controlling cough which is also worse/ has not started meds from discharge  rec Plan A = Automatic = symbicort 80 Take 2 puffs first thing in am and then another 2 puffs about 12 hours later.  Work on Interior and spatial [employer]: Plan B = Backup Only use your albuterol as a rescue medication       03/23/2018 acute extended  ov/[name] re:   Copd  - extended ov re new noct cough /wheeze and need for saba / no med calendar Chief Complaint  Patient presents with  . Acute Visit    Breathing is overall doing well. She uses her albuterol inhaler 4 x per wk on average. She has not needed her neb.  Mid February breathing /coughing  worse / wheezing every noct needs albuterol every noct Very confused with instructions/ now has neb but did not disclose med she uses at the med calendar ov and did not bring the med calendar with her nor the med for the  nebulizer and easily frustrated re concept of med reconciliation  Daytime sob = MMRC2 = can't walk a nl pace on a flat grade s sob but does fine slow and flat  rec Plan A = Automatic = symbicort 160 Take 2 puffs first thing in am and then another 2 puffs about 12 hours later.  Plan B = Backup Only use your albuterol as a rescue medication  Plan C = Crisis - only use your albuterol nebulizer if you first try Plan B and it fails to help > ok to use the nebulizer up to every 4 hours but if start needing it regularly call for immediate appointment     04/20/2018  f/u ov/[name] re:   Copd gold II / brought med calendar but not clear using consistently / chronic cough/ hbp Chief Complaint  Patient presents with  . Follow-up    COPD , feel good no problems at this time  Dyspnea:  Dances   2- 3 songs / shopping is ok  Cough: better, mostly dry/ throat clearing / noct  Tessalon maybe once every couple days  "it says to take it 3 x daily"  Sleep: better  while maint on symbicort 160 2bid Sleeping fine on 1 pillow     Symbicort 80 or 160 should be  Take 2 puffs first thing in am and then another 2 puffs about 12 hours later- you can pick which one you prefer but if  they are the same then use the lower strength Please schedule a follow up visit in 3 months but call sooner if needed     07/22/2018  f/u ov/[name] re:   GOLD II copd / has calendar not using the action plans at bottom/ since previous ov stopped lyrica and took gabapentin 100 tid x 3 weeks then stopped (was supposed to stay on until ov) Chief Complaint  Patient presents with  . Follow-up    Cough has improved slightly but "still driving me crazy"- prod with clear sputum.  She uses her proair 2  x per wk and rarely uses neb.   Dyspnea:   MMRC2 = can't walk a nl pace on a flat grade s sob but does fine slow and flat  Cough  Tickle/ gag anytime day except doesn't wake her up goes back to childhood SABA use: twice daily on bad days but  recently less / hardly ever uses neb  Says only thing that stops her cough is oxycodone rec Take delsym two tsp every 12 hours and supplement if needed with  Oxycodone  up to 1 every 4 hours to suppress the urge to cough.  Once you have eliminated the cough for 3 straight days try reducing the oxycodone first,  then the delsym as tolerated.   For drainage / throat tickle try take CHLORPHENIRAMINE  4 mg - take one every 4 hours as needed   Reduce symbicort 80 Take 2 puffs first thing in am and then another 2 puffs about 12 hours later. > did not continue consistently "you didn't make this clear"  Gabapentin 300 mg four times a day - if not doing better we need to refer you to [facility] / Dr Joya Gaskins > did not do  See Tammy NP in 6  weeks with all your medications>  Did not do    01/07/2019  Acute extended  ov/Seva Chancy re:  Flare of wheeze off symbicort, cough never resolved / no med calendar/ poor insight into meds/ action plans  Chief Complaint  Patient presents with  . Acute Visit    pt c/o wheezing, prod cough with gray/white mucus, pnd, stuffy nose.   Dyspnea:  MMRC3 = can't walk 100 yards even at a slow pace at a flat grade s stopping due to sob   Cough: severe fits of coughing day and noc  Sleeping: poorly due to cough and wheeze SABA use: qid instead of symbicort  rec Plan A = Automatic = Symbicort 80 Take 2 puffs first thing in am and then another 2 puffs about 12 hours later.  Plan B = Backup (add this to Plan A, not in place of it)  Only use your albuterol inhaler as a rescue medication Plan C = Crisis - only use your albuterol nebulizer if you first try Plan B For drainage / throat tickle try take CHLORPHENIRAMINE  4 mg(Chlortab at [facility]) -  Take delsym two tsp every 12 hours and supplement if needed with  vicodine up to 2 every 4 hours to suppress the urge to cough. Swallowing water and/or using ice chips/non mint and menthol containing candies (such as lifesavers or sugarless  jolly ranchers) are also effective.  You should rest your voice and avoid activities that you know make you cough. Once you have eliminated the cough for 3 straight days try reducing the vicodin first,  then the delsym as tolerated.   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 We discussed treatment options including observation, continuing PPI therapy, an evaluation in the voice lab to assess her candidacy for voice therapy and respiratory retraining, or a trial of tramadol.    04/16/2019 acute  extended ov/ Abem Shaddix re recurrent cough on gabapentin 300 tid / did not fill tramadol "I told Dr Joya Gaskins it wouldn't work" "only better with oxycodone"  Chief Complaint  Patient presents with  . Acute Visit    Increased cough x 3 wks- prod with white sputum. She has had some wheezing also. She is using her albuterol inhaler at least once per day and rarely uses neb.  Dyspnea:  Minimal  Cough: worse tickle/ no h1 on hand says plans to pick it up  Sleeping: fine 2 pillows / not typically waking her up  SABA use: rarely  02: none  Doesn't think delsym helped/ no h1 on hand / using gabapentin 300 mg tid   gen chest discomfort with severe coughing fits  rec Prednisone 10 mg take  4 each am x 2 days,   2 each am x 2 days,  1 each am x 2 days and stop   Take delsym two tsp every 12 hours and supplement if needed with oxidocone 5 mg up to 2 every 4 hours to suppress the urge to cough.  Once you have eliminated the cough for 3 straight days try reducing the oxycodone first,  then the delsym as tolerated.   Keep your appts with speech therapy  Please schedule a follow up visit in 3 months  but call sooner if needed  with all medications /inhalers/ solutions in hand so we can verify exactly what you are taking. This includes all medications from all doctors and over the counters     07/19/2019  Virtual f/u ov/Courvoisier Hamblen re: GOLD II COPD/uacs Dyspnea:  A lot of walking inside walking 15-20 min  Cough: none, no cough meds  Sleeping: no problem / 2 pillows  SABA use: rare 02: none  rec No change in medications    Virtual Visit via Telephone Note 12/16/2019   I connected with [name] on 12/16/19 at  1:45 PM EST by telephone and verified that I am speaking with the correct person using two identifiers.   I discussed the limitations, risks, security and privacy concerns of performing an evaluation and management service by telephone and the availability of in person appointments. I also discussed with the patient that there may be a patient responsible charge related to this service. The patient expressed understanding and agreed to proceed.   History of Present Illness: Was fine until 2 weeks on symb 80 2bid/ ppi ac/singulair /gabapentin tid,  Was not taking proair or neb  Dyspnea: was walking house fine but nothing outside/ steps slowly ok  Cough: acutely worse slimy slt yellowish with nasal  Sleeping: after a while on 3 pillows  SABA use: hfa no  Better so changed to duoneb 3-4 days     No obvious day to day or daytime variability or assoc excess/ purulent sputum or mucus plugs or hemoptysis or cp or chest tightness, subjective wheeze or overt sinus or hb symptoms.    Also denies any obvious fluctuation of symptoms with weather or environmental changes or other aggravating or alleviating factors except as outlined above.   Meds reviewed/ med reconciliation completed     Observations/Objective: Harsh barking cough on phone / no obvious wob speaking in full sentences / struggling with names of meds    Assessment and Plan: See problem list for active  a/p's   Follow Up Instructions: See avs for instructions unique to this ov which includes revised/ updated med list     I discussed  the assessment and treatment plan with the patient. The patient was provided an opportunity to ask questions and all were answered. The patient agreed with the plan and demonstrated an understanding of the instructions.   The patient was advised to call back or seek an in-person evaluation if the symptoms worsen or if the condition fails to improve as anticipated.  I provided 26  minutes of non-face-to-face time during this encounter.   [name], MD

## 2019-12-16 NOTE — Patient Instructions (Addendum)
Prednisone 10 mg take  4 each am x 2 days,   2 each am x 2 days,  1 each am x 2 days and stop   Zpak   mucinex dm 1200 mg every 12 hours and supplement with oxycodone 5 mg 1-2 every 4 hours as needed    Increase the gabapentin 300 mg 4 x daily   Stop Proair (albuterol) and just use the Nebulizer (duoneb= ipatroprium/albuterol)  up to every 4 hours   If not improving  Within a week we need to see you  all your medications / inhalers/ solutions) and consider a flutter valve

## 2019-12-16 NOTE — Assessment & Plan Note (Addendum)
Quit smoking 2007 PFT: 03/17/2014-  FEV1  1.28 (58%) ratio 66 and no better p saba, ERV 27 and DLCO 56 corrects to 77 - 11/30/2014   try dulera 100 2bid > 01/03/2015 not clear helping,  use prn > flare since mid 02/2015 cough > sob  - 04/07/2015 p extensive coaching HFA effectiveness =    90% > try dulera 100 2bid > improved 04/21/15   - 06/01/2015   try dulera 200 2bid  > did not maintain  - Spirometry 04/14/2017  FEV1 1.13 (58%)  Ratio 52 p saba w/in 4 h > add symb 80 2bid   - 08/19/2017    resumed sym 80 2bid -  03/23/2018    try symb 160 due to new noct cough/ wheeze > improved 04/20/2018  - 07/22/2018   try symb 80 2bid due to UACS - [facility]/267/[facility] rec add spiriva smi > pt declined   No evidence of a typical aecopd here so no need to change rx / zpak empirically added but doubt needed here /did change to neb as Plan B so as not to aggravate cough with too much hfa rx (see separate a/p)   Pt informed of the seriousness of COVID 19 infection as a direct risk to their health  and safey and to those of their loved ones and should continue to wear facemask in public and minimize exposure to public locations but especially avoid any area or activity where non-close contacts are not observing distancing or wearing an appropriate face mask.

## 2019-12-17 NOTE — Telephone Encounter (Signed)
MW please advise.  Thanks.  

## 2019-12-27 ENCOUNTER — Other Ambulatory Visit: Payer: Self-pay

## 2019-12-27 MED ORDER — PANTOPRAZOLE SODIUM 40 MG PO TBEC: DELAYED_RELEASE_TABLET | ORAL | 0 refills | Status: DC

## 2019-12-27 NOTE — Telephone Encounter (Signed)
Med: Pantoprazole 40mg  Last OV:12/16/19 with Dr. [name] Next OV: Nothing scheduled Last filled by Dr. [name] on 11/29/19 30 tablets w/0 refills. Take 1 tablet by mouth every day 30 to 60 minutes before first meal of the day.  Dr. [name] please advise

## 2019-12-28 ENCOUNTER — Encounter: Payer: Self-pay | Admitting: Internal Medicine

## 2020-01-03 ENCOUNTER — Telehealth: Payer: Self-pay

## 2020-01-03 ENCOUNTER — Telehealth: Payer: Medicare Other | Admitting: Pharmacist

## 2020-01-04 ENCOUNTER — Encounter: Payer: Self-pay | Admitting: Internal Medicine

## 2020-01-04 ENCOUNTER — Other Ambulatory Visit: Payer: Self-pay

## 2020-01-04 ENCOUNTER — Ambulatory Visit (INDEPENDENT_AMBULATORY_CARE_PROVIDER_SITE_OTHER): Payer: Medicare Other | Admitting: Internal Medicine

## 2020-01-04 VITALS — BP 124/78 | HR 80 | Temp 98.3°F | Ht 64.8 in | Wt 196.2 lb

## 2020-01-04 DIAGNOSIS — J449 Chronic obstructive pulmonary disease, unspecified: Secondary | ICD-10-CM

## 2020-01-04 DIAGNOSIS — E66811 Obesity, class 1: Secondary | ICD-10-CM

## 2020-01-04 DIAGNOSIS — R202 Paresthesia of skin: Secondary | ICD-10-CM | POA: Diagnosis not present

## 2020-01-04 DIAGNOSIS — Z23 Encounter for immunization: Secondary | ICD-10-CM

## 2020-01-04 DIAGNOSIS — E6609 Other obesity due to excess calories: Secondary | ICD-10-CM | POA: Diagnosis not present

## 2020-01-04 DIAGNOSIS — Z6832 Body mass index (BMI) 32.0-32.9, adult: Secondary | ICD-10-CM

## 2020-01-04 DIAGNOSIS — E1122 Type 2 diabetes mellitus with diabetic chronic kidney disease: Secondary | ICD-10-CM | POA: Diagnosis not present

## 2020-01-04 DIAGNOSIS — N182 Chronic kidney disease, stage 2 (mild): Secondary | ICD-10-CM | POA: Diagnosis not present

## 2020-01-04 MED ORDER — PNEUMOCOCCAL 13-VAL CONJ VACC IM SUSP: 0.5000 mL | INTRAMUSCULAR | 0 refills | Status: AC

## 2020-01-04 MED ORDER — LIDOCAINE 5 % EX PTCH: 3.0000 | MEDICATED_PATCH | CUTANEOUS | 1 refills | Status: DC

## 2020-01-04 NOTE — Patient Instructions (Signed)
Exercising to Stay Healthy To become healthy and stay healthy, it is recommended that you do moderate-intensity and vigorous-intensity exercise. You can tell that you are exercising at a moderate intensity if your heart starts beating faster and you start breathing faster but can still hold a conversation. You can tell that you are exercising at a vigorous intensity if you are breathing much harder and faster and cannot hold a conversation while exercising. Exercising regularly is important. It has many health benefits, such as:  Improving overall fitness, flexibility, and endurance.  Increasing bone density.  Helping with weight control.  Decreasing body fat.  Increasing muscle strength.  Reducing stress and tension.  Improving overall health. How often should I exercise? Choose an activity that you enjoy, and set realistic goals. Your health care provider can help you make an activity plan that works for you. Exercise regularly as told by your health care provider. This may include:  Doing strength training two times a week, such as: ? Lifting weights. ? Using resistance bands. ? Push-ups. ? Sit-ups. ? Yoga.  Doing a certain intensity of exercise for a given amount of time. Choose from these options: ? A total of 150 minutes of moderate-intensity exercise every week. ? A total of 75 minutes of vigorous-intensity exercise every week. ? A mix of moderate-intensity and vigorous-intensity exercise every week. Children, pregnant women, people who have not exercised regularly, people who are overweight, and older adults may need to talk with a health care provider about what activities are safe to do. If you have a medical condition, be sure to talk with your health care provider before you start a new exercise program. What are some exercise ideas? Moderate-intensity exercise ideas include:  Walking 1 mile (1.6 km) in about 15  minutes.  Biking.  Hiking.  Golfing.  Dancing.  Water aerobics. Vigorous-intensity exercise ideas include:  Walking 4.5 miles (7.2 km) or more in about 1 hour.  Jogging or running 5 miles (8 km) in about 1 hour.  Biking 10 miles (16.1 km) or more in about 1 hour.  Lap swimming.  Roller-skating or in-line skating.  Cross-country skiing.  Vigorous competitive sports, such as football, basketball, and soccer.  Jumping rope.  Aerobic dancing. What are some everyday activities that can help me to get exercise?  Yard work, such as: ? Pushing a lawn mower. ? Raking and bagging leaves.  Washing your car.  Pushing a stroller.  Shoveling snow.  Gardening.  Washing windows or floors. How can I be more active in my day-to-day activities?  Use stairs instead of an elevator.  Take a walk during your lunch break.  If you drive, park your car farther away from your work or school.  If you take public transportation, get off one stop early and walk the rest of the way.  Stand up or walk around during all of your indoor phone calls.  Get up, stretch, and walk around every 30 minutes throughout the day.  Enjoy exercise with a friend. Support to continue exercising will help you keep a regular routine of activity. What guidelines can I follow while exercising?  Before you start a new exercise program, talk with your health care provider.  Do not exercise so much that you hurt yourself, feel dizzy, or get very short of breath.  Wear comfortable clothes and wear shoes with good support.  Drink plenty of water while you exercise to prevent dehydration or heat stroke.  Work out until your breathing   and your heartbeat get faster. Where to find more information  [place] Department of Health and Human Services: www.hhs.gov  Centers for Disease Control and Prevention (CDC): www.cdc.gov Summary  Exercising regularly is important. It will improve your overall fitness,  flexibility, and endurance.  Regular exercise also will improve your overall health. It can help you control your weight, reduce stress, and improve your bone density.  Do not exercise so much that you hurt yourself, feel dizzy, or get very short of breath.  Before you start a new exercise program, talk with your health care provider. This information is not intended to replace advice given to you by your health care provider. Make sure you discuss any questions you have with your health care provider. Document Revised: 11/28/2017 Document Reviewed: 11/06/2017 Elsevier Patient Education  2020 [employer]  

## 2020-01-05 LAB — HEMOGLOBIN A1C
Est. average glucose Bld gHb Est-mCnc: 143 mg/dL
Hgb A1c MFr Bld: 6.6 % — ABNORMAL HIGH (ref 4.8–5.6)

## 2020-01-05 LAB — BMP8+EGFR
BUN/Creatinine Ratio: 6 — ABNORMAL LOW (ref 12–28)
BUN: 5 mg/dL — ABNORMAL LOW (ref 8–27)
CO2: 26 mmol/L (ref 20–29)
Calcium: 9.9 mg/dL (ref 8.7–10.3)
Chloride: 103 mmol/L (ref 96–106)
Creatinine, Ser: 0.9 mg/dL (ref 0.57–1.00)
GFR calc Af Amer: 74 mL/min/{1.73_m2} (ref >59)
GFR calc non Af Amer: 64 mL/min/{1.73_m2} (ref >59)
Glucose: 91 mg/dL (ref 65–99)
Potassium: 4.8 mmol/L (ref 3.5–5.2)
Sodium: 142 mmol/L (ref 134–144)

## 2020-01-05 LAB — VITAMIN B12: Vitamin B-12: 361 pg/mL (ref 232–1245)

## 2020-01-05 LAB — TSH: TSH: 1.4 u[IU]/mL (ref 0.450–4.500)

## 2020-01-06 DIAGNOSIS — M545 Low back pain: Secondary | ICD-10-CM | POA: Diagnosis not present

## 2020-01-09 NOTE — Progress Notes (Signed)
This visit occurred during the SARS-CoV-2 public health emergency.  Safety protocols were in place, including screening questions prior to the visit, additional usage of staff PPE, and extensive cleaning of exam room while observing appropriate contact time as indicated for disinfecting solutions.  Subjective:     Patient ID: [name] , female    DOB: 02-Oct-1946 , 74 y.o.   MRN: [id number]   Chief Complaint  Patient presents with  . Diabetes  . Immunizations    pneumonia    HPI  She is here today for a diabetes check. She is willing to update any immunizations that are needed.   Diabetes She presents for her follow-up diabetic visit. She has type 2 diabetes mellitus. Her disease course has been stable. There are no hypoglycemic associated symptoms. There are no hypoglycemic complications. Risk factors for coronary artery disease include diabetes mellitus, dyslipidemia, hypertension, obesity, sedentary lifestyle and post-menopausal. She participates in exercise intermittently. Her breakfast blood glucose is taken between 8-9 am. Her breakfast blood glucose range is generally 110-130 mg/dl. Eye exam is current.     Past Medical History:  Diagnosis Date  . COPD (chronic obstructive pulmonary disease) ([place])   . Diabetes ([place])   . High cholesterol   . Hypertension   . Pneumonia 09/10/2017     Family History  Problem Relation Age of Onset  . Heart murmur Mother   . Heart attack Father   . Diabetes Father   . Diabetes Brother      Current Outpatient Medications:  .  Accu-Chek FastClix Lancets MISC, Inject 1 each as directed daily. Use as directed to check blood sugars daily dx: e11.22, Disp: 50 each, Rfl: 11 .  albuterol (PROAIR HFA) 108 (90 Base) MCG/ACT inhaler, Inhale 2 puffs into the lungs every 4 (four) hours as needed for wheezing or shortness of breath., Disp: 1 Inhaler, Rfl: 11 .  aspirin 81 MG tablet, Take 81 mg by mouth daily. , Disp: , Rfl:  .  Biotin 10000 MCG  TBDP, Take by mouth., Disp: , Rfl:  .  bisoprolol (ZEBETA) 5 MG tablet, Take 0.5 tablets (2.5 mg total) by mouth daily., Disp: 45 tablet, Rfl: 0 .  CALCIUM-MAGNESIUM-ZINC PO, Take 1 capsule by mouth daily., Disp: , Rfl:  .  Cholecalciferol (VITAMIN D3) 2000 UNITS TABS, Take 2,000 Units by mouth daily., Disp: , Rfl:  .  DULoxetine (CYMBALTA) 30 MG capsule, Take 1 capsule (30 mg total) by mouth daily., Disp: 30 capsule, Rfl: 2 .  famotidine (PEPCID) 20 MG tablet, TAKE 1 TABLET BY MOUTH AT BEDTIME, Disp: 90 tablet, Rfl: 3 .  gabapentin (NEURONTIN) 300 MG capsule, Take 1 capsule (300 mg total) by mouth 4 (four) times daily. (Patient taking differently: Take 300 mg by mouth 4 (four) times daily. 3 times per day), Disp: 120 capsule, Rfl: 5 .  glucose blood (ACCU-CHEK GUIDE) test strip, 1 each by Other route daily. Use as instructed to check blood sugars 1 time per day dx: e11.22, Disp: 50 each, Rfl: 11 .  hydrochlorothiazide (HYDRODIURIL) 25 MG tablet, TAKE 1 TABLET BY MOUTH EVERY DAY, Disp: 30 tablet, Rfl: 2 .  ipratropium-albuterol (DUONEB) 0.5-2.5 (3) MG/3ML SOLN, Take 3 mLs by nebulization every 6 (six) hours as needed. DX: J44.9, Disp: 120 mL, Rfl: 2 .  lidocaine (LIDODERM) 5 %, Place 3 patches onto the skin daily. Remove & Discard patch within 12 hours or as directed by MD, Disp: 90 patch, Rfl: 1 .  Magnesium 250 MG  TABS, Take 1 tablet by mouth daily., Disp: , Rfl:  .  montelukast (SINGULAIR) 10 MG tablet, TAKE 1 TABLET(10 MG) BY MOUTH AT BEDTIME, Disp: 30 tablet, Rfl: 11 .  oxyCODONE (ROXICODONE) 5 MG immediate release tablet, Take 1 tablet (5 mg total) by mouth every 4 (four) hours as needed (as needed for severe chronic cough). prn for severe chronic cough, Disp: 40 tablet, Rfl: 0 .  rosuvastatin (CRESTOR) 20 MG tablet, Take 1 tablet (20 mg total) by mouth at bedtime., Disp: 90 tablet, Rfl: 1 .  Semaglutide,0.25 or 0.'5MG'$ /DOS, (OZEMPIC, 0.25 OR 0.5 MG/DOSE,) 2 MG/1.5ML SOPN, Inject 0.5 mg into the  skin once a week. (Patient taking differently: Inject 1 mg into the skin once a week. ), Disp: 3 pen, Rfl: 1 .  SYMBICORT 80-4.5 MCG/ACT inhaler, INHALE 2 PUFFS BY MOUTH TWICE DAILY, Disp: 10.2 g, Rfl: 5   Allergies  Allergen Reactions  . Other Itching    HAIR DYE     Review of Systems  Constitutional: Negative.   Respiratory: Negative.   Cardiovascular: Negative.   Gastrointestinal: Negative.   Neurological: Positive for numbness.       She c/o RLE numbness.   Psychiatric/Behavioral: Negative.      Today's Vitals   01/04/20 1013  BP: 124/78  Pulse: 80  Temp: 98.3 F (36.8 C)  TempSrc: Oral  Weight: 196 lb 3.2 oz (89 kg)  Height: 5' 4.8" (1.646 m)  PainSc: 6   PainLoc: Leg   Body mass index is 32.85 kg/m.   Objective:  Physical Exam Vitals and nursing note reviewed.  Constitutional:      Appearance: Normal appearance.  HENT:     Head: Normocephalic and atraumatic.  Cardiovascular:     Rate and Rhythm: Normal rate and regular rhythm.     Heart sounds: Normal heart sounds.  Pulmonary:     Effort: Pulmonary effort is normal.     Comments: Decreased breath sounds at bases b/l.  Skin:    General: Skin is warm.  Neurological:     General: No focal deficit present.     Mental Status: She is alert.  Psychiatric:        Mood and Affect: Mood normal.        Behavior: Behavior normal.         Assessment And Plan:     1. Diabetes mellitus with stage 2 chronic kidney disease (HCC)  Chronic. I will check labs as listed below. Paperwork also completed for Nov[employer] medication assistance program. This was done at the conclusion of her visit. She will rto in four months for re-evaluation.   - BMP8+EGFR - Hemoglobin A1c  2. COPD mixed type (HCC)  Chronic, yet stable.  She will continue with current inhalers and under the care of Pulmonary.   3. Right leg paresthesias  Chronic. I will check labs as listed below. She is also under the care of Ortho. I will request her  records from [facility].   - Vitamin B12 - TSH  4. Class 1 obesity due to excess calories with serious comorbidity and body mass index (BMI) of 32.0 to 32.9 in adult  She was congratulated on her exercise efforts and encouraged to keep up the great work. She is encouraged to aim for at least 150 minutes per week. She is encouraged to strive for BMI less than 29 to decrease cardiac risk.   5. Immunization due  Rx for Prevnar-13 was sent to the pharmacy.  [name], MD    THE PATIENT IS ENCOURAGED TO PRACTICE SOCIAL DISTANCING DUE TO THE COVID-19 PANDEMIC.

## 2020-01-12 ENCOUNTER — Other Ambulatory Visit: Payer: Self-pay | Admitting: Pharmacy Technician

## 2020-01-12 NOTE — Patient Outreach (Signed)
[facility]) Care Management  01/12/2020  HARJOT ZAVADIL [birth year] [id number]   Received provider portion(s) of patient assistance application(s) for Ozempic. Faxed completed application and required documents into [employer].  Will follow up with company(ies) in 10-14 business days to check status of application(s).  Maud Deed Chana Bode [place] Certified Pharmacy Technician [place] Management Direct [phone]

## 2020-01-17 ENCOUNTER — Ambulatory Visit (INDEPENDENT_AMBULATORY_CARE_PROVIDER_SITE_OTHER): Payer: Medicare Other | Admitting: Pharmacist

## 2020-01-17 ENCOUNTER — Other Ambulatory Visit: Payer: Self-pay | Admitting: Internal Medicine

## 2020-01-17 DIAGNOSIS — N182 Chronic kidney disease, stage 2 (mild): Secondary | ICD-10-CM | POA: Diagnosis not present

## 2020-01-17 DIAGNOSIS — J449 Chronic obstructive pulmonary disease, unspecified: Secondary | ICD-10-CM | POA: Diagnosis not present

## 2020-01-17 DIAGNOSIS — E1122 Type 2 diabetes mellitus with diabetic chronic kidney disease: Secondary | ICD-10-CM | POA: Diagnosis not present

## 2020-01-17 MED ORDER — FAMOTIDINE 20 MG PO TABS: 20.0000 mg | ORAL_TABLET | Freq: Every day | ORAL | 3 refills | Status: DC

## 2020-01-17 NOTE — Progress Notes (Addendum)
Chronic Care Management   Visit Note  01/17/2020 Name: [name] MRN: [id number] DOB: Dec 21, 1946  Referred by: [name], MD Reason for referral : Chronic Care Management (Diabetes, COPD)   [name] is a 74 y.o. year old female who is a primary care patient of [name], MD. The CCM team was consulted for assistance with chronic disease management and care coordination needs related to COPD and DMII  Review of patient status, including review of consultants reports, relevant laboratory and other test results, and collaboration with appropriate care team members and the patient's provider was performed as part of comprehensive patient evaluation and provision of chronic care management services.    I spoke with [name] by telephone today  Medications: Outpatient Encounter Medications as of 01/17/2020  Medication Sig  . Accu-Chek FastClix Lancets MISC Inject 1 each as directed daily. Use as directed to check blood sugars daily dx: e11.22  . albuterol (PROAIR HFA) 108 (90 Base) MCG/ACT inhaler Inhale 2 puffs into the lungs every 4 (four) hours as needed for wheezing or shortness of breath.  Marland Kitchen aspirin 81 MG tablet Take 81 mg by mouth daily.   . Biotin 10000 MCG TBDP Take by mouth.  . bisoprolol (ZEBETA) 5 MG tablet Take 0.5 tablets (2.5 mg total) by mouth daily.  Marland Kitchen CALCIUM-MAGNESIUM-ZINC PO Take 1 capsule by mouth daily.  . Cholecalciferol (VITAMIN D3) 2000 UNITS TABS Take 2,000 Units by mouth daily.  . DULoxetine (CYMBALTA) 30 MG capsule Take 1 capsule (30 mg total) by mouth daily.  . famotidine (PEPCID) 20 MG tablet TAKE 1 TABLET BY MOUTH AT BEDTIME  . gabapentin (NEURONTIN) 300 MG capsule Take 1 capsule (300 mg total) by mouth 4 (four) times daily. (Patient taking differently: Take 300 mg by mouth 4 (four) times daily. 3 times per day)  . glucose blood (ACCU-CHEK GUIDE) test strip 1 each by Other route daily. Use as instructed to check blood sugars 1 time per  day dx: e11.22  . hydrochlorothiazide (HYDRODIURIL) 25 MG tablet TAKE 1 TABLET BY MOUTH EVERY DAY  . ipratropium-albuterol (DUONEB) 0.5-2.5 (3) MG/3ML SOLN Take 3 mLs by nebulization every 6 (six) hours as needed. DX: J44.9  . lidocaine (LIDODERM) 5 % Place 3 patches onto the skin daily. Remove & Discard patch within 12 hours or as directed by MD  . Magnesium 250 MG TABS Take 1 tablet by mouth daily.  . montelukast (SINGULAIR) 10 MG tablet TAKE 1 TABLET(10 MG) BY MOUTH AT BEDTIME  . oxyCODONE (ROXICODONE) 5 MG immediate release tablet Take 1 tablet (5 mg total) by mouth every 4 (four) hours as needed (as needed for severe chronic cough). prn for severe chronic cough  . rosuvastatin (CRESTOR) 20 MG tablet Take 1 tablet (20 mg total) by mouth at bedtime.  . Semaglutide,0.25 or 0.5MG /DOS, (OZEMPIC, 0.25 OR 0.5 MG/DOSE,) 2 MG/1.5ML SOPN Inject 0.5 mg into the skin once a week. (Patient taking differently: Inject 1 mg into the skin once a week. )  . SYMBICORT 80-4.5 MCG/ACT inhaler INHALE 2 PUFFS BY MOUTH TWICE DAILY   No facility-administered encounter medications on file as of 01/17/2020.     Objective:   Goals Addressed            This Visit's Progress     Patient Stated   . I would like to apply for financial assistance for my Ozempic (pt-stated)       Current Barriers:  . Financial Barriers: patient has [employer]  and reports copay for Ozempic is cost prohibitive at this time.  This medication is necessary to optimally manage her chronic conditions (diabetes)  Pharmacist Clinical Goal(s):  Marland Kitchen Over the next 30 days, patient will work with PharmD and providers to relieve medication access concerns  Interventions: . Comprehensive medication review completed; medication list updated in electronic medical record.  [name] by Novo[employer]ordisk: Patient meets income/NO out of pocket spend criteria for this medication's patient assistance program. Reviewed application process. Patient will  provide proof of income, out of pocket spend report, and will sign application. Will collaborate with primary care provider, Dr. [name] for their portion of application. Once completed, will submit to NovoNordisk patient assistance program. . Will begin 9379 application process; application submitted 0/24/09  Patient Self Care Activities:  . Patient will provide necessary portions of application   Please see past updates related to this goal by clicking on the "Past Updates" button in the selected goal      . I would like to optimize my medication management of my chronic conditions (pt-stated)       Current Barriers:  . Diabetes: T2DM; most recent A1c 6.6% on 01/04/20  . Current antihyperglycemic regimen: Ozempic 1mg  weekly with patient assistance program. o Patient reports tolerating higher dose of Ozempic 1mg  weekly o Will reapply for patient assistance to maintain optimal therapeutic outcomes . denies hypoglycemic symptoms, denies hyperglycemic symptoms . Current meal patterns: o Breakfast/Lunch/Supper: encouraged patient to have protein, vegetable, low carb side o Snacks: increased sweets (had honey bun yesterday evening) o Drinks:mostly water, counseled patient to avoid sugary beverages, etc . Current exercise: n/a . Current blood glucose readings: She has not checked FBG in 3 weeks (recent A1c was 6.6%).  Denies hypo/hyper SX.  Patient was previously monitoring blood glucose readings weekly before she injects. Encouraged patient to continue checking once weekly (or if symptomatic). . Cardiovascular risk reduction: o Current hypertensive regimen: bisoprolol, HCTZ o Current hyperlipidemia regimen: rosuvastatin 20mg  qHS    COPD:  -controlled on Symbicort Twice daily  Reports 100% compliance -rescue PRN albuterol inhaler -discussed PAP for Symbicort, instructed patient to call me when she gets near the 3% OOP requirement for inhaler -vaccines up to date  Pharmacist Clinical  Goal(s):  Marland Kitchen Over the next 90 days, patient with work with PharmD and primary care provider to address needs related to optimized medication management of chronic conditions  Interventions: Call completed with patient on 01/17/20 . Comprehensive medication review performed, medication list updated in electronic medical record . Reviewed & discussed the following diabetes-related information with patient: o Follow ADA recommended "diabetes-friendly" diet  (reviewed healthy snack/food options) o Discussed GLP-1 injection technique; Patient uses AccuCheck glucometer o Reviewed medication purpose/side effects-->patient denies adverse events  Patient Self Care Activities:  . Patient will check blood glucose once weekly, document, and provide at future appointments . Patient will focus on medication adherence by continued compliance with medications as prescribed. . Patient will take medications as prescribed . Patient will contact provider with any episodes of hypoglycemia . Patient will report any questions or concerns to provider   Please see past updates related to this goal by clicking on the "Past Updates" button in the selected goal         Plan:   The care management team will reach out to the patient again over the next 30 days.   Provider Signature Regina Eck, PharmD, BCPS Clinical Pharmacist, [facility] [place]  IT consultant: [phone]

## 2020-01-17 NOTE — Patient Instructions (Signed)
Visit Information  Goals Addressed            This Visit's Progress     Patient Stated   . I would like to apply for financial assistance for my Ozempic (pt-stated)       Current Barriers:  . Financial Barriers: patient has Medicare insurance and reports copay for Ozempic is cost prohibitive at this time.  This medication is necessary to optimally manage her chronic conditions (diabetes)  Pharmacist Clinical Goal(s):  Marland Kitchen Over the next 30 days, patient will work with PharmD and providers to relieve medication access concerns  Interventions: . Comprehensive medication review completed; medication list updated in electronic medical record.  [name] by NovoNordisk: Patient meets income/NO out of pocket spend criteria for this medication's patient assistance program. Reviewed application process. Patient will provide proof of income, out of pocket spend report, and will sign application. Will collaborate with primary care provider, Dr. [name] for their portion of application. Once completed, will submit to NovoNordisk patient assistance program. . Will begin 0350 application process; application submitted 0/93/81  Patient Self Care Activities:  . Patient will provide necessary portions of application   Please see past updates related to this goal by clicking on the "Past Updates" button in the selected goal      . I would like to optimize my medication management of my chronic conditions (pt-stated)       Current Barriers:  . Diabetes: T2DM; most recent A1c 6.6% on 01/04/20  . Current antihyperglycemic regimen: Ozempic 1mg  weekly with patient assistance program. o Patient reports tolerating higher dose of Ozempic 1mg  weekly o Will reapply for patient assistance to maintain optimal therapeutic outcomes . denies hypoglycemic symptoms, denies hyperglycemic symptoms . Current meal patterns: o Breakfast/Lunch/Supper: encouraged patient to have protein, vegetable, low carb  side o Snacks: increased sweets (had honey bun yesterday evening) o Drinks:mostly water, counseled patient to avoid sugary beverages, etc . Current exercise: n/a . Current blood glucose readings: She has not checked FBG in 3 weeks (recent A1c was 6.6%).  Denies hypo/hyper SX.  Patient was previously monitoring blood glucose readings weekly before she injects. Encouraged patient to continue checking once weekly (or if symptomatic). . Cardiovascular risk reduction: o Current hypertensive regimen: bisoprolol, HCTZ o Current hyperlipidemia regimen: rosuvastatin 20mg  qHS  Pharmacist Clinical Goal(s):  Marland Kitchen Over the next 90 days, patient with work with PharmD and primary care provider to address needs related to optimized medication management of chronic conditions  Interventions: Call completed with patient on 12/08/19 . Comprehensive medication review performed, medication list updated in electronic medical record . Reviewed & discussed the following diabetes-related information with patient: o Follow ADA recommended "diabetes-friendly" diet  (reviewed healthy snack/food options) o Discussed GLP-1 injection technique; Patient uses AccuCheck glucometer o Reviewed medication purpose/side effects-->patient denies adverse events  Patient Self Care Activities:  . Patient will check blood glucose once weekly, document, and provide at future appointments . Patient will focus on medication adherence by continued compliance with medications as prescribed. . Patient will take medications as prescribed . Patient will contact provider with any episodes of hypoglycemia . Patient will report any questions or concerns to provider   Please see past updates related to this goal by clicking on the "Past Updates" button in the selected goal         The patient verbalized understanding of instructions provided today and declined a print copy of patient instruction materials.   The care management team will  reach out to the patient again over the next 30 days.   SIGNATURE Regina Eck, PharmD, BCPS Clinical Pharmacist, [facility] [place]: [phone]

## 2020-01-18 DIAGNOSIS — M5416 Radiculopathy, lumbar region: Secondary | ICD-10-CM | POA: Diagnosis not present

## 2020-01-18 DIAGNOSIS — M48061 Spinal stenosis, lumbar region without neurogenic claudication: Secondary | ICD-10-CM | POA: Diagnosis not present

## 2020-01-21 ENCOUNTER — Other Ambulatory Visit: Payer: Self-pay | Admitting: Pharmacy Technician

## 2020-01-21 NOTE — Patient Outreach (Signed)
[place] [facility]) Care Management  01/21/2020  [name] 08-Nov-1946 [id number]     Follow up call placed to [employer] regarding patient assistance application(s) for Ozempic , Olivia Mackie confirms patient has been approved as of 1/17 until 12/29/2020. Medication delivered to [facility] on 01/20/2020.  Follow up:  Will remove myself from care team and  inform [place] Chana Bode [place] Certified Pharmacy Technician [place] Management Direct [phone]

## 2020-01-24 DIAGNOSIS — M48061 Spinal stenosis, lumbar region without neurogenic claudication: Secondary | ICD-10-CM | POA: Diagnosis not present

## 2020-01-24 DIAGNOSIS — M545 Low back pain: Secondary | ICD-10-CM | POA: Diagnosis not present

## 2020-01-24 DIAGNOSIS — M5416 Radiculopathy, lumbar region: Secondary | ICD-10-CM | POA: Diagnosis not present

## 2020-01-31 DIAGNOSIS — M48061 Spinal stenosis, lumbar region without neurogenic claudication: Secondary | ICD-10-CM | POA: Diagnosis not present

## 2020-01-31 DIAGNOSIS — M5416 Radiculopathy, lumbar region: Secondary | ICD-10-CM | POA: Diagnosis not present

## 2020-01-31 DIAGNOSIS — M545 Low back pain: Secondary | ICD-10-CM | POA: Diagnosis not present

## 2020-02-01 DIAGNOSIS — M7582 Other shoulder lesions, left shoulder: Secondary | ICD-10-CM | POA: Diagnosis not present

## 2020-02-01 DIAGNOSIS — M5412 Radiculopathy, cervical region: Secondary | ICD-10-CM | POA: Diagnosis not present

## 2020-02-03 ENCOUNTER — Telehealth: Payer: Self-pay | Admitting: Internal Medicine

## 2020-02-03 MED ORDER — PANTOPRAZOLE SODIUM 40 MG PO TBEC: DELAYED_RELEASE_TABLET | ORAL | 5 refills | Status: DC

## 2020-02-03 NOTE — Telephone Encounter (Signed)
Pt returning a phone call. [phone]

## 2020-02-03 NOTE — Telephone Encounter (Signed)
Attempted to call pt but unable to reach. Left message for pt to return call. 

## 2020-02-03 NOTE — Telephone Encounter (Signed)
Called and spoke with pt and verified med that needed to be sent to the pharmacy. After verifying med and preferred pharmacy, I have sent refill of pantoprazole to preferred pharmacy for pt. Nothing further needed.

## 2020-02-07 ENCOUNTER — Ambulatory Visit (INDEPENDENT_AMBULATORY_CARE_PROVIDER_SITE_OTHER): Payer: Medicare Other | Admitting: Pharmacist

## 2020-02-07 DIAGNOSIS — J449 Chronic obstructive pulmonary disease, unspecified: Secondary | ICD-10-CM

## 2020-02-07 DIAGNOSIS — N182 Chronic kidney disease, stage 2 (mild): Secondary | ICD-10-CM

## 2020-02-07 DIAGNOSIS — E1122 Type 2 diabetes mellitus with diabetic chronic kidney disease: Secondary | ICD-10-CM | POA: Diagnosis not present

## 2020-02-08 DIAGNOSIS — Z1231 Encounter for screening mammogram for malignant neoplasm of breast: Secondary | ICD-10-CM | POA: Diagnosis not present

## 2020-02-08 LAB — HM MAMMOGRAPHY

## 2020-02-09 NOTE — Patient Instructions (Signed)
Visit Information  Goals Addressed            This Visit's Progress     Patient Stated   . COMPLETED: I would like to apply for financial assistance for my Ozempic (pt-stated)       Current Barriers:  . Financial Barriers: patient has Medicare insurance and reports copay for Ozempic is cost prohibitive at this time.  This medication is necessary to optimally manage her chronic conditions (diabetes)  Pharmacist Clinical Goal(s):  Marland Kitchen Over the next 30 days, patient will work with PharmD and providers to relieve medication access concerns  Interventions: . Comprehensive medication review completed; medication list updated in electronic medical record.  [name] by NovoNordisk: Patient meets income/NO out of pocket spend criteria for this medication's patient assistance program. Reviewed application process. Patient will provide proof of income, out of pocket spend report, and will sign application. Will collaborate with primary care provider, Dr. [name] for their portion of application. Once completed, will submit to NovoNordisk patient assistance program. . Will begin 0272 application process; application submitted 5/36/64  Patient Self Care Activities:  . Patient will provide necessary portions of application   Please see past updates related to this goal by clicking on the "Past Updates" button in the selected goal      . I would like to optimize my medication management of my chronic conditions (pt-stated)       Current Barriers:  . Diabetes: T2DM; most recent A1c 6.6% on 01/04/20  . Current antihyperglycemic regimen: Ozempic 1mg  weekly with patient assistance program. o Patient reports tolerating higher dose of Ozempic 1mg  weekly o Will reapply for patient assistance to maintain optimal therapeutic outcomes . denies hypoglycemic symptoms, denies hyperglycemic symptoms . Current meal patterns: o Breakfast/Lunch/Supper: encouraged patient to have protein, vegetable, low carb  side o Snacks: increased sweets (had honey bun yesterday evening) o Drinks:mostly water, counseled patient to avoid sugary beverages, etc . Current exercise: n/a . Current blood glucose readings: Recent A1c was 6.6%.  Denies hypo/hyper SX.  Patient was previously monitoring blood glucose readings weekly before she injects. Encouraged patient to continue checking once weekly (or if symptomatic). . Cardiovascular risk reduction: o Current hypertensive regimen: bisoprolol, HCTZ o Current hyperlipidemia regimen: rosuvastatin 20mg  qHS    COPD:  -controlled on Symbicort Twice daily  Reports 100% compliance -rescue PRN albuterol inhaler -discussed PAP for Symbicort-->will apply for patient assistance -vaccines up to date  Pharmacist Clinical Goal(s):  Marland Kitchen Over the next 90 days, patient with work with PharmD and primary care provider to address needs related to optimized medication management of chronic conditions  Interventions: Call completed with patient on 02/07/20 . Comprehensive medication review performed, medication list updated in electronic medical record . Reviewed & discussed the following diabetes-related information with patient: o Follow ADA recommended "diabetes-friendly" diet  (reviewed healthy snack/food options) o Discussed GLP-1 injection technique; Patient uses AccuCheck glucometer o Reviewed medication purpose/side effects-->patient denies adverse events  Patient Self Care Activities:  . Patient will check blood glucose once weekly, document, and provide at future appointments . Patient will focus on medication adherence by continued compliance with medications as prescribed. . Patient will take medications as prescribed . Patient will contact provider with any episodes of hypoglycemia . Patient will report any questions or concerns to provider   Please see past updates related to this goal by clicking on the "Past Updates" button in the selected goal         The  patient verbalized understanding of instructions provided today and declined a print copy of patient instruction materials.   The care management team will reach out to the patient again over the next 30 days.   SIGNATURE Regina Eck, PharmD, BCPS Clinical Pharmacist, [facility] [place]: [phone]

## 2020-02-09 NOTE — Progress Notes (Signed)
Chronic Care Management    Visit Note  02/07/2020 Name: [name] MRN: [id number] DOB: [birth year]  Referred by: [name], MD Reason for referral : Chronic Care Management   [name] is a 74 y.o. year old female who is a primary care patient of [name], MD. The CCM team was consulted for assistance with chronic disease management and care coordination needs related to COPD and DMII  Review of patient status, including review of consultants reports, relevant laboratory and other test results, and collaboration with appropriate care team members and the patient's provider was performed as part of comprehensive patient evaluation and provision of chronic care management services.     Medications: Outpatient Encounter Medications as of 02/07/2020  Medication Sig  . Accu-Chek FastClix Lancets MISC Inject 1 each as directed daily. Use as directed to check blood sugars daily dx: e11.22  . albuterol (PROAIR HFA) 108 (90 Base) MCG/ACT inhaler Inhale 2 puffs into the lungs every 4 (four) hours as needed for wheezing or shortness of breath.  Marland Kitchen aspirin 81 MG tablet Take 81 mg by mouth daily.   . Biotin 10000 MCG TBDP Take by mouth.  . bisoprolol (ZEBETA) 5 MG tablet Take 0.5 tablets (2.5 mg total) by mouth daily.  Marland Kitchen CALCIUM-MAGNESIUM-ZINC PO Take 1 capsule by mouth daily.  . Cholecalciferol (VITAMIN D3) 2000 UNITS TABS Take 2,000 Units by mouth daily.  . DULoxetine (CYMBALTA) 30 MG capsule Take 1 capsule (30 mg total) by mouth daily.  . famotidine (PEPCID) 20 MG tablet Take 1 tablet (20 mg total) by mouth at bedtime.  . gabapentin (NEURONTIN) 300 MG capsule Take 1 capsule (300 mg total) by mouth 4 (four) times daily. (Patient taking differently: Take 300 mg by mouth 4 (four) times daily. 3 times per day)  . glucose blood (ACCU-CHEK GUIDE) test strip 1 each by Other route daily. Use as instructed to check blood sugars 1 time per day dx: e11.22  . hydrochlorothiazide  (HYDRODIURIL) 25 MG tablet TAKE 1 TABLET BY MOUTH EVERY DAY  . ipratropium-albuterol (DUONEB) 0.5-2.5 (3) MG/3ML SOLN Take 3 mLs by nebulization every 6 (six) hours as needed. DX: J44.9  . lidocaine (LIDODERM) 5 % Place 3 patches onto the skin daily. Remove & Discard patch within 12 hours or as directed by MD  . Magnesium 250 MG TABS Take 1 tablet by mouth daily.  . montelukast (SINGULAIR) 10 MG tablet TAKE 1 TABLET(10 MG) BY MOUTH AT BEDTIME  . oxyCODONE (ROXICODONE) 5 MG immediate release tablet Take 1 tablet (5 mg total) by mouth every 4 (four) hours as needed (as needed for severe chronic cough). prn for severe chronic cough  . pantoprazole (PROTONIX) 40 MG tablet Take 13min before the first meal of the day.  . rosuvastatin (CRESTOR) 20 MG tablet Take 1 tablet (20 mg total) by mouth at bedtime.  . Semaglutide,0.25 or 0.5MG /DOS, (OZEMPIC, 0.25 OR 0.5 MG/DOSE,) 2 MG/1.5ML SOPN Inject 0.5 mg into the skin once a week. (Patient taking differently: Inject 1 mg into the skin once a week. )  . SYMBICORT 80-4.5 MCG/ACT inhaler INHALE 2 PUFFS BY MOUTH TWICE DAILY   No facility-administered encounter medications on file as of 02/07/2020.     Objective:   Goals Addressed            This Visit's Progress     Patient Stated   . COMPLETED: I would like to apply for financial assistance for my Ozempic (pt-stated)  Current Barriers:  . Financial Barriers: patient has [employer] and reports copay for Ozempic is cost prohibitive at this time.  This medication is necessary to optimally manage her chronic conditions (diabetes)  Pharmacist Clinical Goal(s):  Marland Kitchen Over the next 30 days, patient will work with PharmD and providers to relieve medication access concerns  Interventions: . Comprehensive medication review completed; medication list updated in electronic medical record.  [name] by [employer]: Patient meets income/NO out of pocket spend criteria for this medication's patient  assistance program. Reviewed application process. Patient will provide proof of income, out of pocket spend report, and will sign application. Will collaborate with primary care provider, Dr. [name] for their portion of application. Once completed, will submit to [employer] patient assistance program. . Will begin 5784 application process; application submitted 6/96/29  Patient Self Care Activities:  . Patient will provide necessary portions of application   Please see past updates related to this goal by clicking on the "Past Updates" button in the selected goal      . I would like to optimize my medication management of my chronic conditions (pt-stated)       Current Barriers:  . Diabetes: T2DM; most recent A1c 6.6% on 01/04/20  . Current antihyperglycemic regimen: Ozempic 1mg  weekly with patient assistance program. o Patient reports tolerating higher dose of Ozempic 1mg  weekly o Will reapply for patient assistance to maintain optimal therapeutic outcomes . denies hypoglycemic symptoms, denies hyperglycemic symptoms . Current meal patterns: o Breakfast/Lunch/Supper: encouraged patient to have protein, vegetable, low carb side o Snacks: increased sweets (had honey bun yesterday evening) o Drinks:mostly water, counseled patient to avoid sugary beverages, etc . Current exercise: n/a . Current blood glucose readings: Recent A1c was 6.6%.  Denies hypo/hyper SX.  Patient was previously monitoring blood glucose readings weekly before she injects. Encouraged patient to continue checking once weekly (or if symptomatic). . Cardiovascular risk reduction: o Current hypertensive regimen: bisoprolol, HCTZ o Current hyperlipidemia regimen: rosuvastatin 20mg  qHS    COPD:  -controlled on Symbicort Twice daily  Reports 100% compliance -rescue PRN albuterol inhaler -discussed PAP for Symbicort-->will apply for patient assistance -vaccines up to date  Pharmacist Clinical Goal(s):  Marland Kitchen Over the  next 90 days, patient with work with PharmD and primary care provider to address needs related to optimized medication management of chronic conditions  Interventions: Call completed with patient on 02/07/20 . Comprehensive medication review performed, medication list updated in electronic medical record . Reviewed & discussed the following diabetes-related information with patient: o Follow ADA recommended "diabetes-friendly" diet  (reviewed healthy snack/food options) o Discussed GLP-1 injection technique; Patient uses AccuCheck glucometer o Reviewed medication purpose/side effects-->patient denies adverse events  Patient Self Care Activities:  . Patient will check blood glucose once weekly, document, and provide at future appointments . Patient will focus on medication adherence by continued compliance with medications as prescribed. . Patient will take medications as prescribed . Patient will contact provider with any episodes of hypoglycemia . Patient will report any questions or concerns to provider   Please see past updates related to this goal by clicking on the "Past Updates" button in the selected goal        Plan:   The care management team will reach out to the patient again over the next 30 days.   Provider Signature Regina Eck, PharmD, BCPS Clinical Pharmacist, [facility] [place]: [phone]

## 2020-02-10 ENCOUNTER — Other Ambulatory Visit: Payer: Self-pay | Admitting: Pharmacy Technician

## 2020-02-10 ENCOUNTER — Encounter: Payer: Self-pay | Admitting: Internal Medicine

## 2020-02-10 NOTE — Patient Outreach (Signed)
[place] [facility]) Care Management  02/10/2020  [name] Dec 15, 1946 [id number]   Incoming message from [place] requesting provider portion of Symbicort application thru [employer] be faxed to Dr. Baird Cancer for completion.  Maud Deed Chana Bode [place] Certified Pharmacy Technician [place] Management Direct Dial:[phone]

## 2020-02-16 ENCOUNTER — Other Ambulatory Visit: Payer: Self-pay | Admitting: Pharmacy Technician

## 2020-02-16 NOTE — Patient Outreach (Signed)
[place] [facility]) Care Management  02/16/2020  [name] 13-Jul-1946 [id number]   Received provider portion(s) of patient assistance application(s) for Symbicort. Faxed completed application and required documents into [employer].  Will follow up with company(ies) in 7-10 business days to check status of application(s).  Maud Deed Chana Bode [place] Certified Pharmacy Technician [place] Management Direct Dial:[phone]

## 2020-03-02 ENCOUNTER — Other Ambulatory Visit: Payer: Self-pay | Admitting: Pharmacy Technician

## 2020-03-02 NOTE — Patient Outreach (Signed)
[place] [facility]) Care Management  03/02/2020  [name] November 05, 1946 [id number]    Follow up call placed to [employer] regarding patient assistance application(s) for Symbicort , [name] confirms patient has been approved as of 2/23 until 12/29/20. 90 supply of medication delivered to patient's home on 3/2.  Follow up:  Will route note to [place] to inform and will remove myself from care team.  Maud Deed. Chana Bode [place] Certified Pharmacy Technician [place] Management Direct [phone]

## 2020-03-07 DIAGNOSIS — M5412 Radiculopathy, cervical region: Secondary | ICD-10-CM | POA: Diagnosis not present

## 2020-03-07 DIAGNOSIS — M5416 Radiculopathy, lumbar region: Secondary | ICD-10-CM | POA: Diagnosis not present

## 2020-03-07 DIAGNOSIS — M542 Cervicalgia: Secondary | ICD-10-CM | POA: Diagnosis not present

## 2020-03-09 ENCOUNTER — Encounter: Payer: Self-pay | Admitting: Internal Medicine

## 2020-03-09 ENCOUNTER — Other Ambulatory Visit: Payer: Self-pay | Admitting: Internal Medicine

## 2020-03-09 MED ORDER — DIAZEPAM 2 MG PO TABS: ORAL_TABLET | ORAL | 0 refills | Status: DC

## 2020-03-09 NOTE — Progress Notes (Signed)
aliv

## 2020-03-10 DIAGNOSIS — M7582 Other shoulder lesions, left shoulder: Secondary | ICD-10-CM | POA: Diagnosis not present

## 2020-03-12 ENCOUNTER — Other Ambulatory Visit: Payer: Self-pay | Admitting: Internal Medicine

## 2020-03-21 DIAGNOSIS — M542 Cervicalgia: Secondary | ICD-10-CM | POA: Diagnosis not present

## 2020-03-21 DIAGNOSIS — M25512 Pain in left shoulder: Secondary | ICD-10-CM | POA: Diagnosis not present

## 2020-03-23 DIAGNOSIS — M5412 Radiculopathy, cervical region: Secondary | ICD-10-CM | POA: Diagnosis not present

## 2020-03-23 DIAGNOSIS — S46012A Strain of muscle(s) and tendon(s) of the rotator cuff of left shoulder, initial encounter: Secondary | ICD-10-CM | POA: Diagnosis not present

## 2020-04-02 ENCOUNTER — Other Ambulatory Visit: Payer: Self-pay | Admitting: Internal Medicine

## 2020-04-04 DIAGNOSIS — M5412 Radiculopathy, cervical region: Secondary | ICD-10-CM | POA: Diagnosis not present

## 2020-04-10 DIAGNOSIS — M542 Cervicalgia: Secondary | ICD-10-CM | POA: Diagnosis not present

## 2020-04-10 DIAGNOSIS — M5412 Radiculopathy, cervical region: Secondary | ICD-10-CM | POA: Diagnosis not present

## 2020-04-10 DIAGNOSIS — M25512 Pain in left shoulder: Secondary | ICD-10-CM | POA: Diagnosis not present

## 2020-04-14 HISTORY — PX: OTHER SURGICAL HISTORY: SHX169

## 2020-04-17 DIAGNOSIS — M24112 Other articular cartilage disorders, left shoulder: Secondary | ICD-10-CM | POA: Diagnosis not present

## 2020-04-17 DIAGNOSIS — M19012 Primary osteoarthritis, left shoulder: Secondary | ICD-10-CM | POA: Diagnosis not present

## 2020-04-17 DIAGNOSIS — S46212A Strain of muscle, fascia and tendon of other parts of biceps, left arm, initial encounter: Secondary | ICD-10-CM | POA: Diagnosis not present

## 2020-04-17 DIAGNOSIS — Y999 Unspecified external cause status: Secondary | ICD-10-CM | POA: Diagnosis not present

## 2020-04-17 DIAGNOSIS — M7522 Bicipital tendinitis, left shoulder: Secondary | ICD-10-CM | POA: Diagnosis not present

## 2020-04-17 DIAGNOSIS — X58XXXA Exposure to other specified factors, initial encounter: Secondary | ICD-10-CM | POA: Diagnosis not present

## 2020-04-17 DIAGNOSIS — M948X1 Other specified disorders of cartilage, shoulder: Secondary | ICD-10-CM | POA: Diagnosis not present

## 2020-04-17 DIAGNOSIS — M7542 Impingement syndrome of left shoulder: Secondary | ICD-10-CM | POA: Diagnosis not present

## 2020-04-17 DIAGNOSIS — S46012A Strain of muscle(s) and tendon(s) of the rotator cuff of left shoulder, initial encounter: Secondary | ICD-10-CM | POA: Diagnosis not present

## 2020-04-17 DIAGNOSIS — S43432A Superior glenoid labrum lesion of left shoulder, initial encounter: Secondary | ICD-10-CM | POA: Diagnosis not present

## 2020-04-24 ENCOUNTER — Other Ambulatory Visit: Payer: Self-pay | Admitting: Internal Medicine

## 2020-04-24 ENCOUNTER — Telehealth: Payer: Self-pay

## 2020-04-25 DIAGNOSIS — M19012 Primary osteoarthritis, left shoulder: Secondary | ICD-10-CM | POA: Diagnosis not present

## 2020-04-25 NOTE — Chronic Care Management (AMB) (Signed)
Chronic Care Management Pharmacy  Name: [name]  MRN: [id number] DOB: [birth year]  Chief Complaint/ HPI  [name],  74 y.o. , female presents for their Initial CCM visit with the clinical pharmacist via telephone due to COVID-19 Pandemic.  PCP : [name], MD  Their chronic conditions include: Hypertension, COPD with chronic cough, diabetes, and high cholesterol  Office Visits:  01/04/20: OV with Dr. Baird Cancer- Presented for DM check. HgbA1c 6.6% , patient assistance for Ozempic completed. BMP8+EGFR ordered (stable). Vitamin B12 and TSH ordered relating to right leg paresthesias (results w/in normal range), sees Ortho. Rx for Prevnar13 sent to Pharmacy. Follow up in 4 months for re-evaluation.  Consult Visit:  Patient reported she had surgery on her left shoulder last week (week of April 18th) to repair tendon tears and bone spurs that were causing significant pain. Will wear sling for 6 weeks and perform physical therapy for 3 months. Methocarbamol, oxycodone, acetaminophen, and celecoxib given after surgery. At follow up visit with surgery yesterday patient started on cyclobenzaprine and ketorolac and told to stop celecoxib until she finished 3 days of ketorolac.   02/07/20- CCM Encounter with Dr. Blanca Friend- Patient assistance for Symbicort application started. Comprehensive medication review.  01/17/20- CCM Encounter with Dr. Blanca Friend- Patient assistance for Ozempic submitted on 01/12/20. Comprehensive medication review.   12/16/19: Pulmonology Virtual visit with Dr. [name]- Chronic cough addressed. Reflux, asthma, or post nasal drip syndrome could contribute to the cycle of cough. Prednisone taper, ZPak, Mucinex DM and oxycodone 5m given. Gabapentin also increased to 3043mfour times daily. Patient instructed to stop albuterol HFA and just use Duoneb solution.   12/08/19: CCM encounter with Dr. PrBlanca FriendComprehensive medication review  Medications: Outpatient Encounter  Medications as of 04/26/2020  Medication Sig  . Accu-Chek FastClix Lancets MISC Inject 1 each as directed daily. Use as directed to check blood sugars daily dx: e11.22  . acetaminophen (TYLENOL) 500 MG tablet Take 2 tablets by mouth every 6 (six) hours as needed. Patient taking every 6-8 hours as needed post surgery  . albuterol (VENTOLIN HFA) 108 (90 Base) MCG/ACT inhaler INHALE 2 PUFFS INTO THE LUNGS EVERY 4 HOURS AS NEEDED FOR WHEEZING OR SHORTNESS OF BREATH  . aspirin 81 MG tablet Take 81 mg by mouth daily.   . Biotin 10000 MCG TBDP Take by mouth.  . bisoprolol (ZEBETA) 5 MG tablet TAKE 1/2 TABLET(2.5 MG) BY MOUTH DAILY  . CALCIUM-MAGNESIUM-ZINC PO Take 1 capsule by mouth daily.  . CELEBREX 100 MG capsule Take 100 mg by mouth 2 (two) times daily. Stopped taking until ketorolac course is finished post surgery  . Cholecalciferol (VITAMIN D3) 2000 UNITS TABS Take 2,000 Units by mouth daily.  . cyclobenzaprine (FLEXERIL) 10 MG tablet Take 1 tablet by mouth every 6 (six) hours as needed for pain. Post surgery  . famotidine (PEPCID) 20 MG tablet Take 1 tablet (20 mg total) by mouth at bedtime.  . gabapentin (NEURONTIN) 300 MG capsule Take 1 capsule (300 mg total) by mouth 4 (four) times daily. (Patient taking differently: Take 300 mg by mouth 2 (two) times daily. )  . glucose blood (ACCU-CHEK GUIDE) test strip 1 each by Other route daily. Use as instructed to check blood sugars 1 time per day dx: e11.22  . ipratropium-albuterol (DUONEB) 0.5-2.5 (3) MG/3ML SOLN Take 3 mLs by nebulization every 6 (six) hours as needed. DX: J44.9  . ketorolac (TORADOL) 10 MG tablet Take 10 mg by mouth every 6 (six) hours  as needed. For 3 days post surgery  . montelukast (SINGULAIR) 10 MG tablet TAKE 1 TABLET(10 MG) BY MOUTH AT BEDTIME  . oxyCODONE (ROXICODONE) 5 MG immediate release tablet Take 1 tablet (5 mg total) by mouth every 4 (four) hours as needed (as needed for severe chronic cough). prn for severe chronic cough   . pantoprazole (PROTONIX) 40 MG tablet Take 80mn before the first meal of the day.  . rosuvastatin (CRESTOR) 20 MG tablet TAKE 1 TABLET(20 MG) BY MOUTH AT BEDTIME  . Semaglutide,0.25 or 0.5MG/DOS, (OZEMPIC, 0.25 OR 0.5 MG/DOSE,) 2 MG/1.5ML SOPN Inject 0.5 mg into the skin once a week. (Patient taking differently: Inject 1 mg into the skin once a week. )  . SYMBICORT 80-4.5 MCG/ACT inhaler INHALE 2 PUFFS BY MOUTH TWICE DAILY  . diazepam (VALIUM) 2 MG tablet One tab one hour prior to procedure, repeat if needed (Patient not taking: Reported on 04/26/2020)  . DULoxetine (CYMBALTA) 30 MG capsule Take 1 capsule (30 mg total) by mouth daily. (Patient not taking: Reported on 04/26/2020)  . hydrochlorothiazide (HYDRODIURIL) 25 MG tablet TAKE 1 TABLET BY MOUTH EVERY DAY (Patient not taking: Reported on 04/26/2020)  . lidocaine (LIDODERM) 5 % Place 3 patches onto the skin daily. Remove & Discard patch within 12 hours or as directed by MD (Patient not taking: Reported on 04/26/2020)  . Magnesium 250 MG TABS Take 1 tablet by mouth daily.   No facility-administered encounter medications on file as of 04/26/2020.   Current Diagnosis/Assessment: SDOH Interventions     Most Recent Value  SDOH Interventions  SDOH Interventions for the Following Domains  Financial Strain  Financial Strain Interventions  Other (Comment) [Financial assistance was completed for Symbicort and Ozempic. Will reapply at the end of this year]     Goals Addressed            This Visit's Progress   . COPD Management       CARE PLAN ENTRY  Current Barriers:  . Chronic Disease Management support, education, and care coordination needs related to COPD  Pharmacist Clinical Goal(s):  .Marland KitchenOver the next 90 days patient will continue to focus on medication adherence  Interventions: . Comprehensive medication review performed.  Patient Self Care Activities:  . Over the next 90 days patient will continue to use medications daily as  directed . Patient will utilize rescue inhaler or nebulizer solution as needed for exacerbations  Initial goal documentation     . Diabetes: Goal A1c <7%       CARE PLAN ENTRY  Current Barriers:  . Diabetes: controlled; complicated by chronic medical conditions including hypertension and high cholesterol Lab Results  Component Value Date   HGBA1C 6.6 (H) 01/04/2020 .   Lab Results  Component Value Date   CREATININE 0.90 01/04/2020   CREATININE 0.99 09/01/2019   CREATININE 0.88 06/07/2019 .  Kidney Function . Lab Results .  Component . Value . Date/Time .   .Marland KitchenCREATININE . 0.90 . 01/04/2020 10:57 AM .   . CREATININE . 0.99 . 09/01/2019 11:13 AM .   . GFRNONAA . 64 . 01/04/2020 10:57 AM .   . GFRAA . 74 . 01/04/2020 10:57 AM .   . Current antihyperglycemic regimen: Ozempic 12mweekly . Denies hypoglycemic symptoms, including dizziness, lightheadedness, shaking, sweating . Denies hyperglycemic symptoms, including polyuria, polydipsia, polyphagia, nocturia, blurred vision, neuropathy . Exercise prior to should surgery: Walk program 15 minutes, 7 days per week . Current blood glucose readings: Does  not check . Cardiovascular risk reduction: o Current hypertensive regimen: Bisoprolol 87m, 1/2 tablet daily o Current hyperlipidemia regimen: Rosuvastatin 273mdaily o Current antiplatelet regimen: Aspirin 8126maily  Pharmacist Clinical Goal(s):  . OMarland Kitchener the next 90 days, patient will work with PharmD and primary care provider to maintain A1c level  Interventions: . Comprehensive medication review performed, medication list updated in electronic medical record  Patient Self Care Activities:  . Patient will check blood glucose if symptomatic over the next 90 days, document, and provide at future appointments  . Patient will take medications as prescribed daily over the next 90 days . Patient will contact provider with any episodes of hypoglycemia . Patient will report any questions  or concerns to provider   Initial goal documentation     . High Blood Pressure: Goal <130/25m75m      CARE PLAN ENTRY  Current Barriers:  . Current antihypertensive regimen: Bisoprolol 5mg,49m2 tablet by mouth daily . Previous antihypertensives tried: Lisinopril, losartan, Bystolic . Last practice recorded BP readings:  BP Readings from Last 3 Encounters:  01/04/20 124/78  09/01/19 136/66  09/01/19 136/66   . Current home BP readings: Does not check at home . Most recent eGFR/CrCl:  . Component . Value . Date/Time .   . GFRMarland KitchenONAA . 64 . 01/04/2020 1057 .   . GFRMarland KitchenA . 74 . 01/04/2020 1057     Pharmacist Clinical Goal(s):  . OveMarland Kitchen the next 90 days, patient will work with PharmD and providers to optimize antihypertensive regimen  Interventions: . Comprehensive medication review performed; medication list updated in the electronic medical record.   Patient Self Care Activities:  . Patient will resume exercise as able post-surgery over the next 90 days . Patient will continue to check BP if symptomatic, document, and provide at future appointments . Patient will focus on medication adherence by continuing to use pill box over the next 90 days  Initial goal documentation     . High Cholesterol Management: Goal LDL <70       CARE PLAN ENTRY  Current Barriers:  . Hyperlipidemia, complicated by diabetes . Current antihyperlipidemic regimen: Rosuvastatin 20mg 12my . No previous antihyperlipidemic medications noted . Most recent lipid panel:     Component Value Date/Time   CHOL 188 09/01/2019 1113   TRIG 116 09/01/2019 1113   HDL 58 09/01/2019 1113   CHOLHDL 3.2 09/01/2019 1113   LDLCALC 109 (H) 09/01/2019 1113   . ASCVD risk enhancing conditions: age >65, D>52HTN, CKD, CHF, current smoker . 10-year ASCVD risk score: 26.4%  Pharmacist Clinical Goal(s):  . OverMarland Kitchenthe next 90 days, patient will work with PharmD and providers towards optimized antihyperlipidemic therapy   Interventions: . Comprehensive medication review performed; medication list updated in electronic medical record.  . CollMarland Kitchenboration with PCP regarding optimization of high cholesterol treatment  Patient Self Care Activities:  . Patient will focus on medication adherence by continuing to use pill box daily  . Patient will continue to focus on heart healthy diet over the next 90 days  Initial goal documentation         COPD with chronic cough   Last spirometry score: FEV1 1.1 (58% predicted) , FVC 2.2, FEV/FVC 52% on 04/14/17  Gold Grade: Gold 2 (FEV1 50-79%)  Eosinophil count:   Lab Results  Component Value Date/Time   EOSPCT 2 09/10/2017 06:36 AM  Eos (Absolute):  Lab Results  Component Value Date/Time   EOSABS 0.1 09/10/2017 06:36 AM   Tobacco Status:  Social History   Tobacco Use  Smoking Status Former Smoker  . Packs/day: 0.25  . Years: 42.00  . Pack years: 10.50  . Types: Cigarettes  . Quit date: 01/30/2006  . Years since quitting: 14.2  Smokeless Tobacco Never Used   Patient has failed these meds in past: Stan Head, Spiriva Patient is currently controlled on the following medications:  -Symbicort 80-4.50mg/act 2 puffs twice daily -Ventolin 2 puffs into lungs every 4 hours as needed for wheezing/ SOB -Duoneb 1 vial every 6 hours prn -Montelukast 152mdaily  Chronic cough currently treated with the following medications: -Famotidine 2019maily -Pantoprazole 37m89mily -Gabapentin 300mg90mimes daily (patient only taking twice daily)  Using maintenance inhaler regularly? Yes Frequency of rescue inhaler use:  infrequently; patient reports she has not had to use Ventolin or Duoneb since January  We discussed:  -Importance of compliance with maintenance inhaler; patient reports 100% compliance  Plan -Continue current medications ,  Diabetes   Recent Relevant Labs: Lab Results  Component Value Date/Time   HGBA1C 6.6 (H)  01/04/2020 10:57 AM   HGBA1C 7.0 (H) 09/01/2019 11:13 AM   MICROALBUR 10 09/01/2019 02:42 PM   MICROALBUR 30 02/03/2019 05:00 PM   Kidney Function Lab Results  Component Value Date   CREATININE 0.90 01/04/2020   CREATININE 0.99 09/01/2019   CREATININE 0.88 06/07/2019      Component Value Date/Time   GFRNONAA 64 01/04/2020 1057   GFRAA 74 01/04/2020 1057    Stage 2 CKD- Stable  Checking BG: Never  Patient has failed these meds in past: Victoza Patient is currently controlled on the following medications:  -Ozempic 1mg w32mly  Last diabetic Foot exam: 09/01/19  Last diabetic Eye exam: 10/19/19 Lab Results  Component Value Date/Time   HMDIABEYEEXA No Retinopathy 10/19/2019 12:00 AM    We discussed: -Diet and exercise extensively -Prior to surgery patient reported participating in exercise program on the TV called "[place]ile a day for beginners" which was about 15 minutes of exercises usually 7 days per week. -Encouraged patient to resume exercising as soon as possible post-surgery with a goal of 150 minutes per week.   Plan -Continue current medications   Hypertension   Office blood pressures are  BP Readings from Last 3 Encounters:  01/04/20 124/78  09/01/19 136/66  09/01/19 136/66   Patient has failed these meds in the past: Lisinopril, losartan, Bystolic -ACEI/ARB contraindicated/not tolerated in this patient due to history of chronic cough  Patient checks BP at home infrequently, but normal when checked  Patient is currently controlled on the following medications:  -Bisoprolol 5mg 1/83mablet daily  We discussed -Diet and exercise extensively -Checking BP if symptomatic  Plan -Continue current medications    High Cholesterol   Lipid Panel     Component Value Date/Time   CHOL 188 09/01/2019 1113   TRIG 116 09/01/2019 1113   HDL 58 09/01/2019 1113   CHOLHDL 3.2 09/01/2019 1113   LDLCALC 109 (H) 09/01/2019 1113   LABVLDL 21 09/01/2019 1113    The  10-year ASCVD risk score (Goff DMikey Bussing, et al., 2013) is: 26.4%   Values used to calculate the score:     Age: 18 year62    Sex: Female     Is Non-Hispanic African American: Yes     Diabetic: Yes     Tobacco smoker:  No     Systolic Blood Pressure: 634 mmHg     Is BP treated: Yes     HDL Cholesterol: 58 mg/dL     Total Cholesterol: 188 mg/dL   Patient has failed these meds in past: N/A Patient is currently uncontrolled on the following medications:  -Rosuvastatin 43m daily -Aspirin 817m  We discussed:  -Diet and exercise extensively  Plan -Continue current medications  -Consider adding additional agent for LDL reduction (ie. Ezetimibe 1073maily) with goal LDL <70. Will discuss with PCP.  Pain Management Post-Surgery   Patient initially used methocarbamol 500m24mery 6 to 8 hours as needed, celecoxib 100mg70mce daily, acetaminophen 500mg 82mblets every 6 to 8 hours as needed, and oxycodone 5mg 2 68mlets every 4 hours as needed. Patient stopped oxycodone after a few days due to ineffectiveness.  At follow up appointment with surgery on 4/27, patient was given cyclobenzaprine 10mg ev51m6 hours and ketorolac 10mg eve60m hours for 3 days. Told to hold celecoxib until ketorolac course finished.  We discussed: -Not taking methocarbamol while taking cyclobenzaprine. Taking 2 muscle relaxers at the same time can lead to serious adverse events (dizziness, drowsiness, trouble breathing, etc).   Plan -Continue cyclobenzaprine, acetaminophen, ketorolac then celecoxib as directed by surgical team.    Over the Counter Supplements  -Biotin 10,000mcg dai73mor her nails (when she has it) -Calcium 1000mg/Mag 475m/Zinc 116mw/ Vita72mD 15mcg daily -29mecalciferol 2000 units daily  Vaccines   Reviewed and discussed patient's vaccination history.    Immunization History  Administered Date(s) Administered  . Influenza Split 09/29/2013, 09/30/2015  . Influenza Whole 08/30/2016  .  Influenza, High Dose Seasonal PF 09/29/2017, 09/14/2018, 09/01/2019  . Influenza,inj,Quad PF,6+ Mos 09/12/2014  . Influenza-Unspecified 09/14/2018  . Pneumococcal-Unspecified 10/05/2013  . Tdap 06/07/2019   Patient received Prevnar at pharmacy 01/06/20  Plan -Recommended patient receive Shingrix vaccine series at pharmacy. Will discuss with PCP.  Medication Management   [facility] Uses pill box? Yes Pt endorses 100% compliance with maintenance medications  We discussed the importance of medication adherence.  Plan -Continue current medication management strategy  Follow up: 3 month phone visit  Mirabel Ahlgren CaudiJannette Fogocal Pharmacist [facility] [phone]

## 2020-04-26 ENCOUNTER — Ambulatory Visit: Payer: Medicare Other

## 2020-04-26 ENCOUNTER — Other Ambulatory Visit: Payer: Self-pay

## 2020-04-26 DIAGNOSIS — N182 Chronic kidney disease, stage 2 (mild): Secondary | ICD-10-CM

## 2020-04-26 DIAGNOSIS — E7800 Pure hypercholesterolemia, unspecified: Secondary | ICD-10-CM

## 2020-04-26 DIAGNOSIS — I10 Essential (primary) hypertension: Secondary | ICD-10-CM

## 2020-04-26 DIAGNOSIS — E1122 Type 2 diabetes mellitus with diabetic chronic kidney disease: Secondary | ICD-10-CM

## 2020-04-26 DIAGNOSIS — J449 Chronic obstructive pulmonary disease, unspecified: Secondary | ICD-10-CM

## 2020-04-28 DIAGNOSIS — E7800 Pure hypercholesterolemia, unspecified: Secondary | ICD-10-CM | POA: Insufficient documentation

## 2020-04-28 NOTE — Patient Instructions (Addendum)
Visit Information  Goals Addressed            This Visit's Progress   . COPD Management       CARE PLAN ENTRY  Current Barriers:  . Chronic Disease Management support, education, and care coordination needs related to COPD  Pharmacist Clinical Goal(s):  Marland Kitchen Over the next 90 days patient will continue to focus on medication adherence  Interventions: . Comprehensive medication review performed.  Patient Self Care Activities:  . Over the next 90 days patient will continue to use medications daily as directed . Patient will utilize rescue inhaler or nebulizer solution as needed for exacerbations  Initial goal documentation     . Diabetes: Goal A1c <7%       CARE PLAN ENTRY  Current Barriers:  . Diabetes: controlled; complicated by chronic medical conditions including hypertension and high cholesterol Lab Results  Component Value Date   HGBA1C 6.6 (H) 01/04/2020 .   Lab Results  Component Value Date   CREATININE 0.90 01/04/2020   CREATININE 0.99 09/01/2019   CREATININE 0.88 06/07/2019 .  Kidney Function . Lab Results .  Component . Value . Date/Time .   Marland Kitchen CREATININE . 0.90 . 01/04/2020 10:57 AM .   . CREATININE . 0.99 . 09/01/2019 11:13 AM .   . GFRNONAA . 64 . 01/04/2020 10:57 AM .   . GFRAA . 74 . 01/04/2020 10:57 AM .   . Current antihyperglycemic regimen: Ozempic 58m weekly . Denies hypoglycemic symptoms, including dizziness, lightheadedness, shaking, sweating . Denies hyperglycemic symptoms, including polyuria, polydipsia, polyphagia, nocturia, blurred vision, neuropathy . Exercise prior to should surgery: Walk program 15 minutes, 7 days per week . Current blood glucose readings: Does not check . Cardiovascular risk reduction: o Current hypertensive regimen: Bisoprolol 593m 1/2 tablet daily o Current hyperlipidemia regimen: Rosuvastatin 2031maily o Current antiplatelet regimen: Aspirin 22m33mily  Pharmacist Clinical Goal(s):  . OvMarland Kitchenr the next 90 days, patient  will work with PharmD and primary care provider to maintain A1c level  Interventions: . Comprehensive medication review performed, medication list updated in electronic medical record  Patient Self Care Activities:  . Patient will check blood glucose if symptomatic over the next 90 days, document, and provide at future appointments  . Patient will take medications as prescribed daily over the next 90 days . Patient will contact provider with any episodes of hypoglycemia . Patient will report any questions or concerns to provider   Initial goal documentation     . High Blood Pressure: Goal <130/80mm87m     CARE PLAN ENTRY  Current Barriers:  . Current antihypertensive regimen: Bisoprolol 5mg, 69m tablet by mouth daily . Previous antihypertensives tried: Lisinopril, losartan, Bystolic . Last practice recorded BP readings:  BP Readings from Last 3 Encounters:  01/04/20 124/78  09/01/19 136/66  09/01/19 136/66   . Current home BP readings: Does not check at home . Most recent eGFR/CrCl:  . Component . Value . Date/Time .   . GFRNMarland KitchenNAA . 64 . 01/04/2020 1057 .   . GFRAMarland Kitchen . 74 . 01/04/2020 1057     Pharmacist Clinical Goal(s):  . OverMarland Kitchenthe next 90 days, patient will work with PharmD and providers to optimize antihypertensive regimen  Interventions: . Comprehensive medication review performed; medication list updated in the electronic medical record.   Patient Self Care Activities:  . Patient will resume exercise as able post-surgery over the next 90 days . Patient will continue to check BP if  symptomatic, document, and provide at future appointments . Patient will focus on medication adherence by continuing to use pill box over the next 90 days  Initial goal documentation     . High Cholesterol Management: Goal LDL <70       CARE PLAN ENTRY  Current Barriers:  . Hyperlipidemia, complicated by diabetes . Current antihyperlipidemic regimen: Rosuvastatin 20mg daily . No  previous antihyperlipidemic medications noted . Most recent lipid panel:     Component Value Date/Time   CHOL 188 09/01/2019 1113   TRIG 116 09/01/2019 1113   HDL 58 09/01/2019 1113   CHOLHDL 3.2 09/01/2019 1113   LDLCALC 109 (H) 09/01/2019 1113   . ASCVD risk enhancing conditions: age >65, DM, HTN, CKD, CHF, current smoker . 10-year ASCVD risk score: 26.4%  Pharmacist Clinical Goal(s):  . Over the next 90 days, patient will work with PharmD and providers towards optimized antihyperlipidemic therapy  Interventions: . Comprehensive medication review performed; medication list updated in electronic medical record.  . Collaboration with PCP regarding optimization of high cholesterol treatment  Patient Self Care Activities:  . Patient will focus on medication adherence by continuing to use pill box daily  . Patient will continue to focus on heart healthy diet over the next 90 days  Initial goal documentation         Ms. Teters was given information about Chronic Care Management services today including:  1. CCM service includes personalized support from designated clinical staff supervised by her physician, including individualized plan of care and coordination with other care providers 2. 24/7 contact phone numbers for assistance for urgent and routine care needs. 3. Standard insurance, coinsurance, copays and deductibles apply for chronic care management only during months in which we provide at least 20 minutes of these services. Most insurances cover these services at 100%, however patients may be responsible for any copay, coinsurance and/or deductible if applicable. This service may help you avoid the need for more expensive face-to-face services. 4. Only one practitioner may furnish and bill the service in a calendar month. 5. The patient may stop CCM services at any time (effective at the end of the month) by phone call to the office staff.  Patient agreed to services and  verbal consent obtained.   The patient verbalized understanding of instructions provided today and agreed to receive a mailed copy of patient instruction and/or educational materials. Telephone follow up appointment with pharmacy team member scheduled for: 07/26/20 @ 10:00 am   , PharmD Clinical Pharmacist [facility] [phone]   Exercising to Stay Healthy To become healthy and stay healthy, it is recommended that you do moderate-intensity and vigorous-intensity exercise. You can tell that you are exercising at a moderate intensity if your heart starts beating faster and you start breathing faster but can still hold a conversation. You can tell that you are exercising at a vigorous intensity if you are breathing much harder and faster and cannot hold a conversation while exercising. Exercising regularly is important. It has many health benefits, such as:  Improving overall fitness, flexibility, and endurance.  Increasing bone density.  Helping with weight control.  Decreasing body fat.  Increasing muscle strength.  Reducing stress and tension.  Improving overall health. How often should I exercise? Choose an activity that you enjoy, and set realistic goals. Your health care provider can help you make an activity plan that works for you. Exercise regularly as told by your health care provider. This may include:    Doing strength training two times a week, such as: ? Lifting weights. ? Using resistance bands. ? Push-ups. ? Sit-ups. ? Yoga.  Doing a certain intensity of exercise for a given amount of time. Choose from these options: ? A total of 150 minutes of moderate-intensity exercise every week. ? A total of 75 minutes of vigorous-intensity exercise every week. ? A mix of moderate-intensity and vigorous-intensity exercise every week. Children, pregnant women, people who have not exercised regularly, people who are overweight, and older  adults may need to talk with a health care provider about what activities are safe to do. If you have a medical condition, be sure to talk with your health care provider before you start a new exercise program. What are some exercise ideas? Moderate-intensity exercise ideas include:  Walking 1 mile (1.6 km) in about 15 minutes.  Biking.  Hiking.  Golfing.  Dancing.  Water aerobics. Vigorous-intensity exercise ideas include:  Walking 4.5 miles (7.2 km) or more in about 1 hour.  Jogging or running 5 miles (8 km) in about 1 hour.  Biking 10 miles (16.1 km) or more in about 1 hour.  Lap swimming.  Roller-skating or in-line skating.  Cross-country skiing.  Vigorous competitive sports, such as football, basketball, and soccer.  Jumping rope.  Aerobic dancing. What are some everyday activities that can help me to get exercise?  Yard work, such as: ? Pushing a lawn mower. ? Raking and bagging leaves.  Washing your car.  Pushing a stroller.  Shoveling snow.  Gardening.  Washing windows or floors. How can I be more active in my day-to-day activities?  Use stairs instead of an elevator.  Take a walk during your lunch break.  If you drive, park your car farther away from your work or school.  If you take public transportation, get off one stop early and walk the rest of the way.  Stand up or walk around during all of your indoor phone calls.  Get up, stretch, and walk around every 30 minutes throughout the day.  Enjoy exercise with a friend. Support to continue exercising will help you keep a regular routine of activity. What guidelines can I follow while exercising?  Before you start a new exercise program, talk with your health care provider.  Do not exercise so much that you hurt yourself, feel dizzy, or get very short of breath.  Wear comfortable clothes and wear shoes with good support.  Drink plenty of water while you exercise to prevent dehydration  or heat stroke.  Work out until your breathing and your heartbeat get faster. Where to find more information  [place] Department of Health and Human Services: www.hhs.gov  Centers for Disease Control and Prevention (CDC): www.cdc.gov Summary  Exercising regularly is important. It will improve your overall fitness, flexibility, and endurance.  Regular exercise also will improve your overall health. It can help you control your weight, reduce stress, and improve your bone density.  Do not exercise so much that you hurt yourself, feel dizzy, or get very short of breath.  Before you start a new exercise program, talk with your health care provider. This information is not intended to replace advice given to you by your health care provider. Make sure you discuss any questions you have with your health care provider. Document Revised: 11/28/2017 Document Reviewed: 11/06/2017 Elsevier Patient Education  2020 Elsevier Inc. Zoster Vaccine, Recombinant injection What is this medicine? ZOSTER VACCINE (ZOS ter vak SEEN) is used to prevent shingles in   adults 73 years old and over. This vaccine is not used to treat shingles or nerve pain from shingles. This medicine may be used for other purposes; ask your health care provider or pharmacist if you have questions. COMMON BRAND NAME(S): SHINGRIX What should I tell my health care provider before I take this medicine? They need to know if you have any of these conditions:  blood disorders or disease  cancer like leukemia or lymphoma  immune system problems or therapy  an unusual or allergic reaction to vaccines, other medications, foods, dyes, or preservatives  pregnant or trying to get pregnant  breast-feeding How should I use this medicine? This vaccine is for injection in a muscle. It is given by a health care professional. Talk to your pediatrician regarding the use of this medicine in children. This medicine is not approved for use in  children. Overdosage: If you think you have taken too much of this medicine contact a poison control center or emergency room at once. NOTE: This medicine is only for you. Do not share this medicine with others. What if I miss a dose? Keep appointments for follow-up (booster) doses as directed. It is important not to miss your dose. Call your doctor or health care professional if you are unable to keep an appointment. What may interact with this medicine?  medicines that suppress your immune system  medicines to treat cancer  steroid medicines like prednisone or cortisone This list may not describe all possible interactions. Give your health care provider a list of all the medicines, herbs, non-prescription drugs, or dietary supplements you use. Also tell them if you smoke, drink alcohol, or use illegal drugs. Some items may interact with your medicine. What should I watch for while using this medicine? Visit your doctor for regular check ups. This vaccine, like all vaccines, may not fully protect everyone. What side effects may I notice from receiving this medicine? Side effects that you should report to your doctor or health care professional as soon as possible:  allergic reactions like skin rash, itching or hives, swelling of the face, lips, or tongue  breathing problems Side effects that usually do not require medical attention (report these to your doctor or health care professional if they continue or are bothersome):  chills  headache  fever  nausea, vomiting  redness, warmth, pain, swelling or itching at site where injected  tiredness This list may not describe all possible side effects. Call your doctor for medical advice about side effects. You may report side effects to FDA at 1-800-FDA-1088. Where should I keep my medicine? This vaccine is only given in a clinic, pharmacy, doctor's office, or other health care setting and will not be stored at home. NOTE: This sheet  is a summary. It may not cover all possible information. If you have questions about this medicine, talk to your doctor, pharmacist, or health care provider.  2020 Elsevier/Gold Standard (2017-07-28 13:20:30)  

## 2020-05-02 DIAGNOSIS — M25612 Stiffness of left shoulder, not elsewhere classified: Secondary | ICD-10-CM | POA: Diagnosis not present

## 2020-05-02 DIAGNOSIS — M25512 Pain in left shoulder: Secondary | ICD-10-CM | POA: Diagnosis not present

## 2020-05-02 DIAGNOSIS — M6281 Muscle weakness (generalized): Secondary | ICD-10-CM | POA: Diagnosis not present

## 2020-05-02 DIAGNOSIS — S43422D Sprain of left rotator cuff capsule, subsequent encounter: Secondary | ICD-10-CM | POA: Diagnosis not present

## 2020-05-04 ENCOUNTER — Other Ambulatory Visit: Payer: Self-pay

## 2020-05-04 ENCOUNTER — Ambulatory Visit (INDEPENDENT_AMBULATORY_CARE_PROVIDER_SITE_OTHER): Payer: Medicare Other | Admitting: Internal Medicine

## 2020-05-04 ENCOUNTER — Encounter: Payer: Self-pay | Admitting: Internal Medicine

## 2020-05-04 VITALS — BP 132/60 | HR 81 | Temp 98.4°F | Ht 64.8 in | Wt 192.4 lb

## 2020-05-04 DIAGNOSIS — E1122 Type 2 diabetes mellitus with diabetic chronic kidney disease: Secondary | ICD-10-CM

## 2020-05-04 DIAGNOSIS — Z6832 Body mass index (BMI) 32.0-32.9, adult: Secondary | ICD-10-CM

## 2020-05-04 DIAGNOSIS — Z23 Encounter for immunization: Secondary | ICD-10-CM | POA: Diagnosis not present

## 2020-05-04 DIAGNOSIS — E6609 Other obesity due to excess calories: Secondary | ICD-10-CM | POA: Diagnosis not present

## 2020-05-04 DIAGNOSIS — N182 Chronic kidney disease, stage 2 (mild): Secondary | ICD-10-CM

## 2020-05-04 DIAGNOSIS — I129 Hypertensive chronic kidney disease with stage 1 through stage 4 chronic kidney disease, or unspecified chronic kidney disease: Secondary | ICD-10-CM | POA: Diagnosis not present

## 2020-05-04 DIAGNOSIS — M75102 Unspecified rotator cuff tear or rupture of left shoulder, not specified as traumatic: Secondary | ICD-10-CM | POA: Diagnosis not present

## 2020-05-04 MED ORDER — SHINGRIX 50 MCG/0.5ML IM SUSR: 0.5000 mL | Freq: Once | INTRAMUSCULAR | 0 refills | Status: AC

## 2020-05-04 NOTE — Progress Notes (Signed)
This visit occurred during the SARS-CoV-2 public health emergency.  Safety protocols were in place, including screening questions prior to the visit, additional usage of staff PPE, and extensive cleaning of exam room while observing appropriate contact time as indicated for disinfecting solutions.  Subjective:     Patient ID: [name] , female    DOB: 1946/09/30 , 74 y.o.   MRN: [id number]   Chief Complaint  Patient presents with  . Diabetes  . Hypertension    HPI  Diabetes She presents for her follow-up diabetic visit. She has type 2 diabetes mellitus. Her disease course has been stable. There are no hypoglycemic associated symptoms. Pertinent negatives for diabetes include no blurred vision and no chest pain. There are no hypoglycemic complications. Risk factors for coronary artery disease include diabetes mellitus, dyslipidemia, hypertension, obesity, sedentary lifestyle and post-menopausal. She participates in exercise intermittently. Her breakfast blood glucose is taken between 8-9 am. Her breakfast blood glucose range is generally 110-130 mg/dl. Eye exam is current.  Hypertension This is a chronic problem. The current episode started more than 1 year ago. The problem has been gradually improving since onset. The problem is controlled. Pertinent negatives include no blurred vision, chest pain, palpitations or shortness of breath. The current treatment provides moderate improvement. Compliance problems include exercise.      Past Medical History:  Diagnosis Date  . COPD (chronic obstructive pulmonary disease) ([place])   . Diabetes ([place])   . High cholesterol   . Hypertension   . Pneumonia 09/10/2017     Family History  Problem Relation Age of Onset  . Heart murmur Mother   . Heart attack Father   . Diabetes Father   . Diabetes Brother      Current Outpatient Medications:  .  Accu-Chek FastClix Lancets MISC, Inject 1 each as directed daily. Use as directed to check  blood sugars daily dx: e11.22, Disp: 50 each, Rfl: 11 .  acetaminophen (TYLENOL) 500 MG tablet, Take 2 tablets by mouth every 6 (six) hours as needed. Patient taking every 6-8 hours as needed post surgery, Disp: , Rfl:  .  albuterol (VENTOLIN HFA) 108 (90 Base) MCG/ACT inhaler, INHALE 2 PUFFS INTO THE LUNGS EVERY 4 HOURS AS NEEDED FOR WHEEZING OR SHORTNESS OF BREATH, Disp: 6.7 g, Rfl: 1 .  aspirin 81 MG tablet, Take 81 mg by mouth daily. , Disp: , Rfl:  .  Biotin 10000 MCG TBDP, Take by mouth., Disp: , Rfl:  .  bisoprolol (ZEBETA) 5 MG tablet, TAKE 1/2 TABLET(2.5 MG) BY MOUTH DAILY, Disp: 45 tablet, Rfl: 0 .  CALCIUM-MAGNESIUM-ZINC PO, Take 1 capsule by mouth daily., Disp: , Rfl:  .  Cholecalciferol (VITAMIN D3) 2000 UNITS TABS, Take 2,000 Units by mouth daily., Disp: , Rfl:  .  famotidine (PEPCID) 20 MG tablet, Take 1 tablet (20 mg total) by mouth at bedtime., Disp: 90 tablet, Rfl: 3 .  gabapentin (NEURONTIN) 300 MG capsule, Take 1 capsule (300 mg total) by mouth 4 (four) times daily. (Patient taking differently: Take 300 mg by mouth 2 (two) times daily. ), Disp: 120 capsule, Rfl: 5 .  glucose blood (ACCU-CHEK GUIDE) test strip, 1 each by Other route daily. Use as instructed to check blood sugars 1 time per day dx: e11.22, Disp: 50 each, Rfl: 11 .  ipratropium-albuterol (DUONEB) 0.5-2.5 (3) MG/3ML SOLN, Take 3 mLs by nebulization every 6 (six) hours as needed. DX: J44.9, Disp: 120 mL, Rfl: 2 .  lidocaine (LIDODERM) 5 %, Place  3 patches onto the skin daily. Remove & Discard patch within 12 hours or as directed by MD, Disp: 90 patch, Rfl: 1 .  Magnesium 250 MG TABS, Take 1 tablet by mouth daily., Disp: , Rfl:  .  montelukast (SINGULAIR) 10 MG tablet, TAKE 1 TABLET(10 MG) BY MOUTH AT BEDTIME, Disp: 30 tablet, Rfl: 11 .  pantoprazole (PROTONIX) 40 MG tablet, Take 95mn before the first meal of the day., Disp: 30 tablet, Rfl: 5 .  rosuvastatin (CRESTOR) 20 MG tablet, TAKE 1 TABLET(20 MG) BY MOUTH AT  BEDTIME, Disp: 90 tablet, Rfl: 1 .  Semaglutide,0.25 or 0.5MG/DOS, (OZEMPIC, 0.25 OR 0.5 MG/DOSE,) 2 MG/1.5ML SOPN, Inject 0.5 mg into the skin once a week. (Patient taking differently: Inject 1 mg into the skin once a week. ), Disp: 3 pen, Rfl: 1 .  SYMBICORT 80-4.5 MCG/ACT inhaler, INHALE 2 PUFFS BY MOUTH TWICE DAILY, Disp: 10.2 g, Rfl: 5 .  diazepam (VALIUM) 2 MG tablet, One tab one hour prior to procedure, repeat if needed (Patient not taking: Reported on 04/26/2020), Disp: 2 tablet, Rfl: 0 .  DULoxetine (CYMBALTA) 30 MG capsule, Take 1 capsule (30 mg total) by mouth daily. (Patient not taking: Reported on 04/26/2020), Disp: 30 capsule, Rfl: 2 .  hydrochlorothiazide (HYDRODIURIL) 25 MG tablet, TAKE 1 TABLET BY MOUTH EVERY DAY (Patient not taking: Reported on 04/26/2020), Disp: 30 tablet, Rfl: 2 .  ketorolac (TORADOL) 10 MG tablet, Take 10 mg by mouth every 6 (six) hours as needed. For 3 days post surgery, Disp: , Rfl:  .  oxyCODONE (ROXICODONE) 5 MG immediate release tablet, Take 1 tablet (5 mg total) by mouth every 4 (four) hours as needed (as needed for severe chronic cough). prn for severe chronic cough, Disp: 40 tablet, Rfl: 0   Allergies  Allergen Reactions  . Other Itching    HAIR DYE     Review of Systems  Constitutional: Negative.   Eyes: Negative for blurred vision.  Respiratory: Negative.  Negative for shortness of breath.   Cardiovascular: Negative.  Negative for chest pain and palpitations.  Gastrointestinal: Negative.   Neurological: Negative.   Psychiatric/Behavioral: Negative.      Today's Vitals   05/04/20 1047  BP: 132/60  Pulse: 81  Temp: 98.4 F (36.9 C)  TempSrc: Oral  Weight: 192 lb 6.4 oz (87.3 kg)  Height: 5' 4.8" (1.646 m)  PainSc: 8   PainLoc: Arm   Body mass index is 32.21 kg/m.   Objective:  Physical Exam Vitals and nursing note reviewed.  Constitutional:      Appearance: Normal appearance.  HENT:     Head: Normocephalic and atraumatic.   Cardiovascular:     Rate and Rhythm: Normal rate and regular rhythm.     Heart sounds: Normal heart sounds.  Pulmonary:     Effort: Pulmonary effort is normal.     Breath sounds: Normal breath sounds.  Skin:    General: Skin is warm.  Neurological:     General: No focal deficit present.     Mental Status: She is alert.  Psychiatric:        Mood and Affect: Mood normal.        Behavior: Behavior normal.         Assessment And Plan:     1. Diabetes mellitus with stage 2 chronic kidney disease (HCC)  Chronic, I will check labs as listed below. I will make medication adjustments as needed.   - CMP14+EGFR - Lipid panel -  Hemoglobin A1c  2. Hypertensive nephropathy  Chronic, controlled. She will continue with current meds for now. Encouraged to avoid adding salt to her foods.   3. Class 1 obesity due to excess calories with serious comorbidity and body mass index (BMI) of 32.0 to 32.9 in adult  She is encouraged to strive for BMI less than 30 to decrease cardiac risk. Advised to aim for 30 minutes of exercise five days per week. BMI 30 and below is acceptable for her demographics.   4. Immunization due  Rx Shingrix was sent to her local pharmacy.  Pt advised this was part of her adult immuinzation history. She was advised to wait 90 days from second COVID vaccine prior to starting Shingrix series.  5. Nontraumatic tear of left rotator cuff, unspecified tear extent  S/p surgical repair. She is encouraged to perform exercises as per PT.    [name], MD    THE PATIENT IS ENCOURAGED TO PRACTICE SOCIAL DISTANCING DUE TO THE COVID-19 PANDEMIC.

## 2020-05-04 NOTE — Patient Instructions (Signed)
Zoster Vaccine, Recombinant injection What is this medicine? ZOSTER VACCINE (ZOS ter vak SEEN) is used to prevent shingles in adults 73 years old and over. This vaccine is not used to treat shingles or nerve pain from shingles. This medicine may be used for other purposes; ask your health care provider or pharmacist if you have questions. COMMON BRAND NAME(S): SHINGRIX What should I tell my health care provider before I take this medicine? They need to know if you have any of these conditions:  blood disorders or disease  cancer like leukemia or lymphoma  immune system problems or therapy  an unusual or allergic reaction to vaccines, other medications, foods, dyes, or preservatives  pregnant or trying to get pregnant  breast-feeding How should I use this medicine? This vaccine is for injection in a muscle. It is given by a health care professional. Talk to your pediatrician regarding the use of this medicine in children. This medicine is not approved for use in children. Overdosage: If you think you have taken too much of this medicine contact a poison control center or emergency room at once. NOTE: This medicine is only for you. Do not share this medicine with others. What if I miss a dose? Keep appointments for follow-up (booster) doses as directed. It is important not to miss your dose. Call your doctor or health care professional if you are unable to keep an appointment. What may interact with this medicine?  medicines that suppress your immune system  medicines to treat cancer  steroid medicines like prednisone or cortisone This list may not describe all possible interactions. Give your health care provider a list of all the medicines, herbs, non-prescription drugs, or dietary supplements you use. Also tell them if you smoke, drink alcohol, or use illegal drugs. Some items may interact with your medicine. What should I watch for while using this medicine? Visit your doctor for  regular check ups. This vaccine, like all vaccines, may not fully protect everyone. What side effects may I notice from receiving this medicine? Side effects that you should report to your doctor or health care professional as soon as possible:  allergic reactions like skin rash, itching or hives, swelling of the face, lips, or tongue  breathing problems Side effects that usually do not require medical attention (report these to your doctor or health care professional if they continue or are bothersome):  chills  headache  fever  nausea, vomiting  redness, warmth, pain, swelling or itching at site where injected  tiredness This list may not describe all possible side effects. Call your doctor for medical advice about side effects. You may report side effects to FDA at 1-800-FDA-1088. Where should I keep my medicine? This vaccine is only given in a clinic, pharmacy, doctor's office, or other health care setting and will not be stored at home. NOTE: This sheet is a summary. It may not cover all possible information. If you have questions about this medicine, talk to your doctor, pharmacist, or health care provider.  2020 Elsevier/Gold Standard (2017-07-28 13:20:30)  

## 2020-05-05 LAB — CMP14+EGFR
ALT: 21 IU/L (ref 0–32)
AST: 34 IU/L (ref 0–40)
Albumin/Globulin Ratio: 1.8 (ref 1.2–2.2)
Albumin: 4.6 g/dL (ref 3.7–4.7)
Alkaline Phosphatase: 74 IU/L (ref 39–117)
BUN/Creatinine Ratio: 8 — ABNORMAL LOW (ref 12–28)
BUN: 6 mg/dL — ABNORMAL LOW (ref 8–27)
Bilirubin Total: 0.2 mg/dL (ref 0.0–1.2)
CO2: 24 mmol/L (ref 20–29)
Calcium: 9.7 mg/dL (ref 8.7–10.3)
Chloride: 102 mmol/L (ref 96–106)
Creatinine, Ser: 0.76 mg/dL (ref 0.57–1.00)
GFR calc Af Amer: 91 mL/min/{1.73_m2} (ref >59)
GFR calc non Af Amer: 79 mL/min/{1.73_m2} (ref >59)
Globulin, Total: 2.5 g/dL (ref 1.5–4.5)
Glucose: 82 mg/dL (ref 65–99)
Potassium: 4.7 mmol/L (ref 3.5–5.2)
Sodium: 141 mmol/L (ref 134–144)
Total Protein: 7.1 g/dL (ref 6.0–8.5)

## 2020-05-05 LAB — LIPID PANEL
Chol/HDL Ratio: 3.1 ratio (ref 0.0–4.4)
Cholesterol, Total: 146 mg/dL (ref 100–199)
HDL: 47 mg/dL (ref >39)
LDL Chol Calc (NIH): 78 mg/dL (ref 0–99)
Triglycerides: 119 mg/dL (ref 0–149)
VLDL Cholesterol Cal: 21 mg/dL (ref 5–40)

## 2020-05-05 LAB — HEMOGLOBIN A1C
Est. average glucose Bld gHb Est-mCnc: 131 mg/dL
Hgb A1c MFr Bld: 6.2 % — ABNORMAL HIGH (ref 4.8–5.6)

## 2020-05-06 ENCOUNTER — Encounter: Payer: Self-pay | Admitting: Internal Medicine

## 2020-05-08 DIAGNOSIS — M6281 Muscle weakness (generalized): Secondary | ICD-10-CM | POA: Diagnosis not present

## 2020-05-08 DIAGNOSIS — M25512 Pain in left shoulder: Secondary | ICD-10-CM | POA: Diagnosis not present

## 2020-05-08 DIAGNOSIS — M25612 Stiffness of left shoulder, not elsewhere classified: Secondary | ICD-10-CM | POA: Diagnosis not present

## 2020-05-08 DIAGNOSIS — S43422D Sprain of left rotator cuff capsule, subsequent encounter: Secondary | ICD-10-CM | POA: Diagnosis not present

## 2020-05-11 DIAGNOSIS — M6281 Muscle weakness (generalized): Secondary | ICD-10-CM | POA: Diagnosis not present

## 2020-05-11 DIAGNOSIS — S43422D Sprain of left rotator cuff capsule, subsequent encounter: Secondary | ICD-10-CM | POA: Diagnosis not present

## 2020-05-11 DIAGNOSIS — M25512 Pain in left shoulder: Secondary | ICD-10-CM | POA: Diagnosis not present

## 2020-05-11 DIAGNOSIS — M25612 Stiffness of left shoulder, not elsewhere classified: Secondary | ICD-10-CM | POA: Diagnosis not present

## 2020-05-16 DIAGNOSIS — M25612 Stiffness of left shoulder, not elsewhere classified: Secondary | ICD-10-CM | POA: Diagnosis not present

## 2020-05-16 DIAGNOSIS — S43422D Sprain of left rotator cuff capsule, subsequent encounter: Secondary | ICD-10-CM | POA: Diagnosis not present

## 2020-05-16 DIAGNOSIS — M25512 Pain in left shoulder: Secondary | ICD-10-CM | POA: Diagnosis not present

## 2020-05-16 DIAGNOSIS — M6281 Muscle weakness (generalized): Secondary | ICD-10-CM | POA: Diagnosis not present

## 2020-05-18 DIAGNOSIS — M25512 Pain in left shoulder: Secondary | ICD-10-CM | POA: Diagnosis not present

## 2020-05-18 DIAGNOSIS — S43422D Sprain of left rotator cuff capsule, subsequent encounter: Secondary | ICD-10-CM | POA: Diagnosis not present

## 2020-05-18 DIAGNOSIS — M25612 Stiffness of left shoulder, not elsewhere classified: Secondary | ICD-10-CM | POA: Diagnosis not present

## 2020-05-18 DIAGNOSIS — M6281 Muscle weakness (generalized): Secondary | ICD-10-CM | POA: Diagnosis not present

## 2020-05-23 DIAGNOSIS — S43422D Sprain of left rotator cuff capsule, subsequent encounter: Secondary | ICD-10-CM | POA: Diagnosis not present

## 2020-05-23 DIAGNOSIS — M6281 Muscle weakness (generalized): Secondary | ICD-10-CM | POA: Diagnosis not present

## 2020-05-23 DIAGNOSIS — M25512 Pain in left shoulder: Secondary | ICD-10-CM | POA: Diagnosis not present

## 2020-05-23 DIAGNOSIS — M25612 Stiffness of left shoulder, not elsewhere classified: Secondary | ICD-10-CM | POA: Diagnosis not present

## 2020-05-25 DIAGNOSIS — M6281 Muscle weakness (generalized): Secondary | ICD-10-CM | POA: Diagnosis not present

## 2020-05-25 DIAGNOSIS — M25612 Stiffness of left shoulder, not elsewhere classified: Secondary | ICD-10-CM | POA: Diagnosis not present

## 2020-05-25 DIAGNOSIS — S43422D Sprain of left rotator cuff capsule, subsequent encounter: Secondary | ICD-10-CM | POA: Diagnosis not present

## 2020-05-25 DIAGNOSIS — M25512 Pain in left shoulder: Secondary | ICD-10-CM | POA: Diagnosis not present

## 2020-05-30 DIAGNOSIS — S43422D Sprain of left rotator cuff capsule, subsequent encounter: Secondary | ICD-10-CM | POA: Diagnosis not present

## 2020-05-30 DIAGNOSIS — M25612 Stiffness of left shoulder, not elsewhere classified: Secondary | ICD-10-CM | POA: Diagnosis not present

## 2020-05-30 DIAGNOSIS — M6281 Muscle weakness (generalized): Secondary | ICD-10-CM | POA: Diagnosis not present

## 2020-05-30 DIAGNOSIS — M25512 Pain in left shoulder: Secondary | ICD-10-CM | POA: Diagnosis not present

## 2020-06-03 ENCOUNTER — Encounter: Payer: Self-pay | Admitting: Internal Medicine

## 2020-06-05 ENCOUNTER — Telehealth: Payer: Self-pay

## 2020-06-05 NOTE — Telephone Encounter (Deleted)
[place] regarding

## 2020-06-06 ENCOUNTER — Other Ambulatory Visit: Payer: Self-pay | Admitting: Internal Medicine

## 2020-06-13 ENCOUNTER — Encounter: Payer: Self-pay | Admitting: Internal Medicine

## 2020-06-13 ENCOUNTER — Telehealth: Payer: Self-pay

## 2020-06-13 ENCOUNTER — Other Ambulatory Visit: Payer: Self-pay

## 2020-06-13 NOTE — Telephone Encounter (Signed)
I am wheezing a lot and having breathing problems for about 2 1/2 weeks. I've been using nebulizer every day. Dr. [name] is not available until August. All nurse practitioners are busy until Monday. They told me that if it gets worse to call my primary doctor or go to urgent care. I don't know if I can hold on until Monday. And I don't know if Dr. Baird Cancer is even available to see me. I really don't want to go to urgent care. I'm depending on Dr. Baird Cancer to tell me what I should do. I am getting scared because it's difficult breathing every time I walk or move around. Thank you.    I spoke with pt and advised her that she needs to go to urgent care to be evaluated given she has been sick for 2 weeks and we don't have any available appointments. Pt declined to go to urgent care. YL,RMA

## 2020-06-13 NOTE — Telephone Encounter (Signed)
Yes ok for virtual visit. Let me speak with her first to see if she needs a CXR. Thanks

## 2020-06-13 NOTE — Telephone Encounter (Signed)
Spoke with pt over the phone. She does not want to do a virutal visit, she does not feel like that's best way to be treated. Pt wants someone to "listen to her lungs." She was scheduled for an office visit on Monday next week. States she will call back if her symptoms get worse.

## 2020-06-14 ENCOUNTER — Encounter: Payer: Self-pay | Admitting: Internal Medicine

## 2020-06-14 ENCOUNTER — Ambulatory Visit (INDEPENDENT_AMBULATORY_CARE_PROVIDER_SITE_OTHER): Payer: Medicare Other | Admitting: Internal Medicine

## 2020-06-14 ENCOUNTER — Other Ambulatory Visit: Payer: Self-pay

## 2020-06-14 VITALS — BP 132/84 | HR 79 | Temp 97.9°F | Ht 64.8 in | Wt 186.6 lb

## 2020-06-14 DIAGNOSIS — E6609 Other obesity due to excess calories: Secondary | ICD-10-CM | POA: Diagnosis not present

## 2020-06-14 DIAGNOSIS — Z6831 Body mass index (BMI) 31.0-31.9, adult: Secondary | ICD-10-CM

## 2020-06-14 DIAGNOSIS — E1122 Type 2 diabetes mellitus with diabetic chronic kidney disease: Secondary | ICD-10-CM

## 2020-06-14 DIAGNOSIS — J441 Chronic obstructive pulmonary disease with (acute) exacerbation: Secondary | ICD-10-CM

## 2020-06-14 DIAGNOSIS — N182 Chronic kidney disease, stage 2 (mild): Secondary | ICD-10-CM | POA: Diagnosis not present

## 2020-06-14 MED ORDER — GABAPENTIN 300 MG PO CAPS: 300.0000 mg | ORAL_CAPSULE | Freq: Two times a day (BID) | ORAL | 0 refills | Status: DC

## 2020-06-14 MED ORDER — PANTOPRAZOLE SODIUM 40 MG PO TBEC: DELAYED_RELEASE_TABLET | ORAL | 2 refills | Status: DC

## 2020-06-14 MED ORDER — TRIAMCINOLONE ACETONIDE 40 MG/ML IJ SUSP
60.0000 mg | Freq: Once | INTRAMUSCULAR | Status: AC
  Administered 2020-06-14: 60 mg via INTRAMUSCULAR

## 2020-06-14 MED ORDER — PREDNISONE 10 MG PO TABS: ORAL_TABLET | ORAL | 0 refills | Status: DC

## 2020-06-14 NOTE — Progress Notes (Signed)
This visit occurred during the SARS-CoV-2 public health emergency.  Safety protocols were in place, including screening questions prior to the visit, additional usage of staff PPE, and extensive cleaning of exam room while observing appropriate contact time as indicated for disinfecting solutions.  Subjective:     Patient ID: [name] , female    DOB: 26-Jan-1946 , 74 y.o.   MRN: [id number]   Chief Complaint  Patient presents with  . Wheezing    patient stated she has been wheezing and having trouble breathing for the last 2 weeks. pt stated she has a productive cough    HPI  She presents today for further evaluation of wheezing and cough. She attempted to see her pulmonologist, but no one was available. She has used her inhalers and nebulizer without any relief of her sx   She denies fever/chills. Not sure what may have triggered her sx, but reports change of seasons can sometimes aggravate her condition. She has h/o COPD.    Past Medical History:  Diagnosis Date  . COPD (chronic obstructive pulmonary disease) ([place])   . Diabetes ([place])   . High cholesterol   . Hypertension   . Pneumonia 09/10/2017     Family History  Problem Relation Age of Onset  . Heart murmur Mother   . Heart attack Father   . Diabetes Father   . Diabetes Brother      Current Outpatient Medications:  .  Accu-Chek FastClix Lancets MISC, Inject 1 each as directed daily. Use as directed to check blood sugars daily dx: e11.22, Disp: 50 each, Rfl: 11 .  acetaminophen (TYLENOL) 500 MG tablet, Take 2 tablets by mouth every 6 (six) hours as needed. Patient taking every 6-8 hours as needed post surgery, Disp: , Rfl:  .  albuterol (VENTOLIN HFA) 108 (90 Base) MCG/ACT inhaler, INHALE 2 PUFFS INTO THE LUNGS EVERY 4 HOURS AS NEEDED FOR WHEEZING OR SHORTNESS OF BREATH, Disp: 6.7 g, Rfl: 1 .  aspirin 81 MG tablet, Take 81 mg by mouth daily. , Disp: , Rfl:  .  Biotin 10000 MCG TBDP, Take by mouth., Disp: , Rfl:  .   bisoprolol (ZEBETA) 5 MG tablet, TAKE 1/2 TABLET(2.5 MG) BY MOUTH DAILY, Disp: 45 tablet, Rfl: 0 .  CALCIUM-MAGNESIUM-ZINC PO, Take 1 capsule by mouth daily., Disp: , Rfl:  .  Cholecalciferol (VITAMIN D3) 2000 UNITS TABS, Take 2,000 Units by mouth daily., Disp: , Rfl:  .  famotidine (PEPCID) 20 MG tablet, Take 1 tablet (20 mg total) by mouth at bedtime., Disp: 90 tablet, Rfl: 3 .  gabapentin (NEURONTIN) 300 MG capsule, Take 1 capsule (300 mg total) by mouth 4 (four) times daily. (Patient taking differently: Take 300 mg by mouth 2 (two) times daily. ), Disp: 120 capsule, Rfl: 5 .  glucose blood (ACCU-CHEK GUIDE) test strip, 1 each by Other route daily. Use as instructed to check blood sugars 1 time per day dx: e11.22, Disp: 50 each, Rfl: 11 .  ipratropium-albuterol (DUONEB) 0.5-2.5 (3) MG/3ML SOLN, Take 3 mLs by nebulization every 6 (six) hours as needed. DX: J44.9, Disp: 120 mL, Rfl: 2 .  lidocaine (LIDODERM) 5 %, Place 3 patches onto the skin daily. Remove & Discard patch within 12 hours or as directed by MD, Disp: 90 patch, Rfl: 1 .  Magnesium 250 MG TABS, Take 1 tablet by mouth daily., Disp: , Rfl:  .  montelukast (SINGULAIR) 10 MG tablet, TAKE 1 TABLET(10 MG) BY MOUTH AT BEDTIME, Disp: 30  tablet, Rfl: 11 .  pantoprazole (PROTONIX) 40 MG tablet, Take 83min before the first meal of the day., Disp: 90 tablet, Rfl: 2 .  rosuvastatin (CRESTOR) 20 MG tablet, TAKE 1 TABLET(20 MG) BY MOUTH AT BEDTIME, Disp: 90 tablet, Rfl: 1 .  Semaglutide,0.25 or 0.5MG /DOS, (OZEMPIC, 0.25 OR 0.5 MG/DOSE,) 2 MG/1.5ML SOPN, Inject 0.5 mg into the skin once a week. (Patient taking differently: Inject 1 mg into the skin once a week. ), Disp: 3 pen, Rfl: 1 .  SYMBICORT 80-4.5 MCG/ACT inhaler, INHALE 2 PUFFS BY MOUTH TWICE DAILY, Disp: 10.2 g, Rfl: 5   Allergies  Allergen Reactions  . Other Itching    HAIR DYE     Review of Systems  Constitutional: Negative.   Respiratory: Positive for shortness of breath and  wheezing.   Cardiovascular: Negative.   Gastrointestinal: Negative.   Neurological: Negative.   Psychiatric/Behavioral: Negative.      Today's Vitals   06/14/20 0851  BP: 132/84  Pulse: 79  Temp: 97.9 F (36.6 C)  TempSrc: Oral  SpO2: 91%  Weight: 186 lb 9.6 oz (84.6 kg)  Height: 5' 4.8" (1.646 m)  PainSc: 0-No pain   Body mass index is 31.24 kg/m.   Objective:  Physical Exam Vitals and nursing note reviewed.  Constitutional:      Appearance: Normal appearance.  HENT:     Head: Normocephalic and atraumatic.  Cardiovascular:     Rate and Rhythm: Normal rate and regular rhythm.     Heart sounds: Normal heart sounds.  Pulmonary:     Effort: Pulmonary effort is normal.     Breath sounds: Wheezing present.  Chest:     Chest wall: No tenderness.  Skin:    General: Skin is warm.  Neurological:     General: No focal deficit present.     Mental Status: She is alert.  Psychiatric:        Mood and Affect: Mood normal.        Behavior: Behavior normal.         Assessment And Plan:     1. COPD with acute exacerbation ([place])  She was given rx prednisone taper. She was also given Kenalog, 60mg  IM x1. She is encouraged to use her nebulizer every six hours as needed. Also advised to call office in 24-48 hours to let me know how she is doing.   - triamcinolone acetonide (KENALOG-40) injection 60 mg  2. Diabetes mellitus with stage 2 chronic kidney disease ([facility])  Chronic. Previous labs reviewed. She is reminded to expect her blood sugars to go up with use of prednisone. Encouraged to avoid sugary beverages, dairy and processed foods while on prednisone. If BS get above 250, she is encouraged to contact myself/the office.   3. Class 1 obesity due to excess calories with serious comorbidity and body mass index (BMI) of 31.0 to 31.9 in adult  She is encouraged to strive for BMi less than 29 to decrease cardiac risk. Advised to increase daily activity once her respiratory  condition improves.   [name], MD    THE PATIENT IS ENCOURAGED TO PRACTICE SOCIAL DISTANCING DUE TO THE COVID-19 PANDEMIC.

## 2020-06-14 NOTE — Patient Instructions (Signed)
Chronic Obstructive Pulmonary Disease Exacerbation  Chronic obstructive pulmonary disease (COPD) is a long-term (chronic) condition that affects the lungs. COPD is a general term that can be used to describe many different lung problems that cause lung swelling (inflammation) and limit airflow, including chronic bronchitis and emphysema. COPD exacerbations are episodes when breathing symptoms become much worse and require extra treatment. COPD exacerbations are usually caused by infections. Without treatment, COPD exacerbations can be severe and even life threatening. Frequent COPD exacerbations can cause further damage to the lungs. What are the causes? This condition may be caused by:  Respiratory infections, including viral and bacterial infections.  Exposure to smoke.  Exposure to air pollution, chemical fumes, or dust.  Things that give you an allergic reaction (allergens).  Not taking your usual COPD medicines as directed.  Underlying medical problems, such as congestive heart failure or infections not involving the lungs. In many cases, the cause (trigger) of this condition is not known. What increases the risk? The following factors may make you more likely to develop this condition:  Smoking cigarettes.  Old age.  Frequent prior COPD exacerbations. What are the signs or symptoms? Symptoms of this condition include:  Increased coughing.  Increased production of mucus from your lungs (sputum).  Increased wheezing.  Increased shortness of breath.  Rapid or labored breathing.  Chest tightness.  Less energy than usual.  Sleep disruption from symptoms.  Confusion or increased sleepiness. Often these symptoms happen or get worse even with the use of medicines. How is this diagnosed? This condition is diagnosed based on:  Your medical history.  A physical exam. You may also have tests, including:  A chest X-ray.  Blood tests.  Lung (pulmonary) function  tests. How is this treated? Treatment for this condition depends on the severity and cause of the symptoms. You may need to be admitted to a hospital for treatment. Some of the treatments commonly used to treat COPD exacerbations are:  Antibiotic medicines. These may be used for severe exacerbations caused by a lung infection, such as pneumonia.  Bronchodilators. These are inhaled medicines that expand the air passages and allow increased airflow.  Steroid medicines. These act to reduce inflammation in the airways. They may be given with an inhaler, taken by mouth, or given through an IV tube inserted into one of your veins.  Supplemental oxygen therapy.  Airway clearing techniques, such as noninvasive ventilation (NIV) and positive expiratory pressure (PEP). These provide respiratory support through a mask or other noninvasive device. An example of this would be using a continuous positive airway pressure (CPAP) machine to improve delivery of oxygen into your lungs. Follow these instructions at home: Medicines  Take over-the-counter and prescription medicines only as told by your health care provider. It is important to use correct technique with inhaled medicines.  If you were prescribed an antibiotic medicine or oral steroid, take it as told by your health care provider. Do not stop taking the medicine even if you start to feel better. Lifestyle  Eat a healthy diet.  Exercise regularly.  Get plenty of sleep.  Avoid exposure to all substances that irritate the airway, especially to tobacco smoke.  Wash your hands often with soap and water to reduce the risk of infection. If soap and water are not available, use hand sanitizer.  During flu season, avoid enclosed spaces that are crowded with people. General instructions  Drink enough fluid to keep your urine clear or pale yellow (unless you have a medical  condition that requires fluid restriction).  Use a cool mist vaporizer. This  humidifies the air and makes it easier for you to clear your chest when you cough.  If you have a home nebulizer and oxygen, continue to use them as told by your health care provider.  Keep all follow-up visits as told by your health care provider. This is important. How is this prevented?  Stay up-to-date on pneumococcal and influenza (flu) vaccines. A flu shot is recommended every year to help prevent exacerbations.  Do not use any products that contain nicotine or tobacco, such as cigarettes and e-cigarettes. Quitting smoking is very important in preventing COPD from getting worse and in preventing exacerbations from happening as often. If you need help quitting, ask your health care provider.  Follow all instructions for pulmonary rehabilitation after a recent exacerbation. This can help prevent future exacerbations.  Work with your health care provider to develop and follow an action plan. This tells you what steps to take when you experience certain symptoms. Contact a health care provider if:  You have a worsening of your regular COPD symptoms. Get help right away if:  You have worsening shortness of breath, even when resting.  You have trouble talking.  You have severe chest pain.  You cough up blood.  You have a fever.  You have weakness, vomit repeatedly, or faint.  You feel confused.  You are not able to sleep because of your symptoms.  You have trouble doing daily activities. Summary  COPD exacerbations are episodes when breathing symptoms become much worse and require extra treatment above your normal treatment.  Exacerbations can be severe and even life threatening. Frequent COPD exacerbations can cause further damage to your lungs.  COPD exacerbations are usually triggered by infections such as the flu, colds, and even pneumonia.  Treatment for this condition depends on the severity and cause of the symptoms. You may need to be admitted to a hospital for  treatment.  Quitting smoking is very important to prevent COPD from getting worse and to prevent exacerbations from happening as often. This information is not intended to replace advice given to you by your health care provider. Make sure you discuss any questions you have with your health care provider. Document Revised: 11/28/2017 Document Reviewed: 01/20/2017 Elsevier Patient Education  2020 [employer].

## 2020-06-15 ENCOUNTER — Telehealth: Payer: Self-pay

## 2020-06-15 NOTE — Telephone Encounter (Signed)
The pt was asked how she is feeling and the pt said she had a better day yesterday after she got the shot, she will start the prednisone today and that she feels like she will be ok.

## 2020-06-19 ENCOUNTER — Other Ambulatory Visit: Payer: Self-pay

## 2020-06-19 ENCOUNTER — Other Ambulatory Visit: Payer: Self-pay | Admitting: Internal Medicine

## 2020-06-19 ENCOUNTER — Ambulatory Visit: Payer: Medicare Other | Admitting: Primary Care

## 2020-06-19 MED ORDER — MONTELUKAST SODIUM 10 MG PO TABS: ORAL_TABLET | ORAL | 2 refills | Status: DC

## 2020-06-29 ENCOUNTER — Encounter: Payer: Self-pay | Admitting: Internal Medicine

## 2020-06-30 DIAGNOSIS — S43422D Sprain of left rotator cuff capsule, subsequent encounter: Secondary | ICD-10-CM | POA: Diagnosis not present

## 2020-06-30 DIAGNOSIS — M25512 Pain in left shoulder: Secondary | ICD-10-CM | POA: Diagnosis not present

## 2020-06-30 DIAGNOSIS — M6281 Muscle weakness (generalized): Secondary | ICD-10-CM | POA: Diagnosis not present

## 2020-06-30 DIAGNOSIS — M25612 Stiffness of left shoulder, not elsewhere classified: Secondary | ICD-10-CM | POA: Diagnosis not present

## 2020-07-04 ENCOUNTER — Ambulatory Visit: Payer: Self-pay

## 2020-07-04 DIAGNOSIS — N182 Chronic kidney disease, stage 2 (mild): Secondary | ICD-10-CM

## 2020-07-04 DIAGNOSIS — I10 Essential (primary) hypertension: Secondary | ICD-10-CM

## 2020-07-04 DIAGNOSIS — E1122 Type 2 diabetes mellitus with diabetic chronic kidney disease: Secondary | ICD-10-CM

## 2020-07-04 NOTE — Chronic Care Management (AMB) (Signed)
   Chronic Care Management Pharmacy  Name: [name]  MRN: [id number] DOB: 1946-01-02  Chief Complaint/ HPI  Ozempic 0.5mg  pens were delivered to office on 06/29/20 from [employer] patient assistance program and picked up by the patient. Patient is supposed to be using Ozempic 1mg  weekly. Sent in corrected refill/reorder request form for Ozempic 1mg  weekly today and contacted [employer] by phone to discuss error. Indicated on refill request form that it was a dose correction as directed by [employer].   Contacted patient to inform her of the status of the 1mg  pens. Instructed her to take 2 injections of 0.5mg  in order to make 1mg  total until we receive shipment of Ozempic 1mg  pens. Patient voiced understanding of directions provided.   PCP : [name], MD    Follow up:  07/26/20 phone visit  Jannette Fogo, PharmD Clinical Pharmacist [facility] [phone]

## 2020-07-06 DIAGNOSIS — M25612 Stiffness of left shoulder, not elsewhere classified: Secondary | ICD-10-CM | POA: Diagnosis not present

## 2020-07-06 DIAGNOSIS — M6281 Muscle weakness (generalized): Secondary | ICD-10-CM | POA: Diagnosis not present

## 2020-07-06 DIAGNOSIS — M25512 Pain in left shoulder: Secondary | ICD-10-CM | POA: Diagnosis not present

## 2020-07-06 DIAGNOSIS — S43422D Sprain of left rotator cuff capsule, subsequent encounter: Secondary | ICD-10-CM | POA: Diagnosis not present

## 2020-07-11 DIAGNOSIS — M6281 Muscle weakness (generalized): Secondary | ICD-10-CM | POA: Diagnosis not present

## 2020-07-11 DIAGNOSIS — M25612 Stiffness of left shoulder, not elsewhere classified: Secondary | ICD-10-CM | POA: Diagnosis not present

## 2020-07-11 DIAGNOSIS — S43422D Sprain of left rotator cuff capsule, subsequent encounter: Secondary | ICD-10-CM | POA: Diagnosis not present

## 2020-07-11 DIAGNOSIS — M25512 Pain in left shoulder: Secondary | ICD-10-CM | POA: Diagnosis not present

## 2020-07-13 DIAGNOSIS — M25612 Stiffness of left shoulder, not elsewhere classified: Secondary | ICD-10-CM | POA: Diagnosis not present

## 2020-07-13 DIAGNOSIS — S43422D Sprain of left rotator cuff capsule, subsequent encounter: Secondary | ICD-10-CM | POA: Diagnosis not present

## 2020-07-13 DIAGNOSIS — M25512 Pain in left shoulder: Secondary | ICD-10-CM | POA: Diagnosis not present

## 2020-07-13 DIAGNOSIS — M6281 Muscle weakness (generalized): Secondary | ICD-10-CM | POA: Diagnosis not present

## 2020-07-17 NOTE — Telephone Encounter (Cosign Needed)
[employer] patient assistance program regarding status of shipment of Ozempic. Per representative medication was processed today and should be shipped out and delivered to the office in 10-14 days. Novo Nordisk is experiencing some shipping delays at this time.

## 2020-07-18 ENCOUNTER — Encounter: Payer: Self-pay | Admitting: Internal Medicine

## 2020-07-18 ENCOUNTER — Ambulatory Visit: Payer: Medicare Other | Admitting: Primary Care

## 2020-07-18 NOTE — Telephone Encounter (Signed)
Called and spoke with pt stating to her the message from [facility] and she verbalized understanding. Pt has been scheduled for an appt today at 4:30 with Beth. Nothing further needed.

## 2020-07-18 NOTE — Telephone Encounter (Signed)
[name] I am not going to prescribe oxycodone or tramadol for cough. She is welcome to see me but will not get script. Her overdose risk score is 340. She can see Dr. [name] at his first available

## 2020-07-18 NOTE — Telephone Encounter (Signed)
Dr. [name], please see pt's mychart message and advise.

## 2020-07-18 NOTE — Telephone Encounter (Signed)
Not seen in 6 months and was supposed to see [facility] NP since then  but somehow that appt got dropped . We need to see her before rx with narcotics before going on vacation if all else fails ok to do televisit but be sure she knows this time that there is a charge involved.

## 2020-07-18 NOTE — Telephone Encounter (Signed)
Called and spoke with pt letting her know that [facility]ust[facility] Maize would not be able to prescribe either oxycodone or tramadol for her as this would need to come from [facility] and she verbalized understanding. Pt stated she would just cancel the appt due to not being able to get what she wants. Nothing further needed.

## 2020-07-19 ENCOUNTER — Encounter: Payer: Self-pay | Admitting: Nurse Practitioner

## 2020-07-19 ENCOUNTER — Other Ambulatory Visit: Payer: Self-pay

## 2020-07-19 ENCOUNTER — Ambulatory Visit (INDEPENDENT_AMBULATORY_CARE_PROVIDER_SITE_OTHER): Payer: Medicare Other | Admitting: Nurse Practitioner

## 2020-07-19 VITALS — BP 140/78 | HR 76 | Temp 98.5°F | Wt 188.4 lb

## 2020-07-19 DIAGNOSIS — R519 Headache, unspecified: Secondary | ICD-10-CM | POA: Diagnosis not present

## 2020-07-19 MED ORDER — TRAMADOL HCL 50 MG PO TABS: 50.0000 mg | ORAL_TABLET | Freq: Four times a day (QID) | ORAL | 0 refills | Status: DC | PRN

## 2020-07-19 NOTE — Progress Notes (Signed)
This visit occurred during the SARS-CoV-2 public health emergency.  Safety protocols were in place, including screening questions prior to the visit, additional usage of staff PPE, and extensive cleaning of exam room while observing appropriate contact time as indicated for disinfecting solutions.  Subjective:     Patient ID: [name] , female    DOB: [birth year] , 74 y.o.   MRN: [id number]   Chief Complaint  Patient presents with  . Facial Pain    patient stated she has been having some pain on the sides of her face that started about 5  days ago she stated it is sensitive to the touch    HPI  Here today complaining with facial pain for the last 5 days, anything that touches it causes a strong sensation. No limitations to opening and closing her mouth. She does not hear any clicking as well.   She had been taking gabapentin until that was changed to Lyrica for her left shoulder pain.  The gabapentin was started by Dr. [name] pulmonary.  When she has the sensation to her face she rates at 9/10.    Past Medical History:  Diagnosis Date  . COPD (chronic obstructive pulmonary disease) ([place])   . Diabetes ([place])   . High cholesterol   . Hypertension   . Pneumonia 09/10/2017     Family History  Problem Relation Age of Onset  . Heart murmur Mother   . Heart attack Father   . Diabetes Father   . Diabetes Brother      Current Outpatient Medications:  .  Accu-Chek FastClix Lancets MISC, Inject 1 each as directed daily. Use as directed to check blood sugars daily dx: e11.22, Disp: 50 each, Rfl: 11 .  albuterol (VENTOLIN HFA) 108 (90 Base) MCG/ACT inhaler, INHALE 2 PUFFS INTO THE LUNGS EVERY 4 HOURS AS NEEDED FOR WHEEZING OR SHORTNESS OF BREATH, Disp: 6.7 g, Rfl: 1 .  aspirin 81 MG tablet, Take 81 mg by mouth daily. , Disp: , Rfl:  .  Biotin 10000 MCG TBDP, Take by mouth., Disp: , Rfl:  .  bisoprolol (ZEBETA) 5 MG tablet, TAKE 1/2 TABLET(2.5 MG) BY MOUTH DAILY, Disp: 45 tablet, Rfl:  0 .  CALCIUM-MAGNESIUM-ZINC PO, Take 1 capsule by mouth daily., Disp: , Rfl:  .  Cholecalciferol (VITAMIN D3) 2000 UNITS TABS, Take 2,000 Units by mouth daily., Disp: , Rfl:  .  famotidine (PEPCID) 20 MG tablet, Take 1 tablet (20 mg total) by mouth at bedtime., Disp: 90 tablet, Rfl: 3 .  glucose blood (ACCU-CHEK GUIDE) test strip, 1 each by Other route daily. Use as instructed to check blood sugars 1 time per day dx: e11.22, Disp: 50 each, Rfl: 11 .  ipratropium-albuterol (DUONEB) 0.5-2.5 (3) MG/3ML SOLN, Take 3 mLs by nebulization every 6 (six) hours as needed. DX: J44.9, Disp: 120 mL, Rfl: 2 .  lidocaine (LIDODERM) 5 %, Place 3 patches onto the skin daily. Remove & Discard patch within 12 hours or as directed by MD, Disp: 90 patch, Rfl: 1 .  Magnesium 250 MG TABS, Take 1 tablet by mouth daily., Disp: , Rfl:  .  montelukast (SINGULAIR) 10 MG tablet, TAKE 1 TABLET(10 MG) BY MOUTH AT BEDTIME, Disp: 30 tablet, Rfl: 2 .  rosuvastatin (CRESTOR) 20 MG tablet, TAKE 1 TABLET(20 MG) BY MOUTH AT BEDTIME, Disp: 90 tablet, Rfl: 1 .  Semaglutide,0.25 or 0.5MG /DOS, (OZEMPIC, 0.25 OR 0.5 MG/DOSE,) 2 MG/1.5ML SOPN, Inject 0.5 mg into the skin once a week. (Patient  taking differently: Inject 1 mg into the skin once a week. ), Disp: 3 pen, Rfl: 1 .  SYMBICORT 80-4.5 MCG/ACT inhaler, INHALE 2 PUFFS BY MOUTH TWICE DAILY, Disp: 10.2 g, Rfl: 5 .  acetaminophen (TYLENOL) 500 MG tablet, Take 2 tablets by mouth every 6 (six) hours as needed. Patient taking every 6-8 hours as needed post surgery (Patient not taking: Reported on 07/19/2020), Disp: , Rfl:  .  pantoprazole (PROTONIX) 40 MG tablet, Take 36min before the first meal of the day., Disp: 90 tablet, Rfl: 2 .  traMADol (ULTRAM) 50 MG tablet, Take 1 tablet (50 mg total) by mouth every 6 (six) hours as needed., Disp: 20 tablet, Rfl: 0   Allergies  Allergen Reactions  . Other Itching    HAIR DYE     Review of Systems  Constitutional: Negative.   Respiratory:  Negative.  Negative for cough.   Cardiovascular: Negative.  Negative for chest pain, palpitations and leg swelling.  Psychiatric/Behavioral: Negative.      Today's Vitals   07/19/20 1407  BP: 140/78  Pulse: 76  Temp: 98.5 F (36.9 C)  TempSrc: Oral  Weight: 188 lb 6.4 oz (85.5 kg)  PainSc: 4   PainLoc: Face   Body mass index is 31.55 kg/m.   Objective:  Physical Exam Constitutional:      General: She is not in acute distress.    Appearance: Normal appearance. She is well-developed. She is obese.  Cardiovascular:     Rate and Rhythm: Normal rate and regular rhythm.     Pulses: Normal pulses.     Heart sounds: Normal heart sounds. No murmur heard.   Pulmonary:     Effort: Pulmonary effort is normal. No respiratory distress.     Breath sounds: Normal breath sounds.  Chest:     Chest wall: No tenderness.  Skin:    General: Skin is warm and dry.     Capillary Refill: Capillary refill takes less than 2 seconds.  Neurological:     General: No focal deficit present.     Mental Status: She is alert and oriented to person, place, and time.     Cranial Nerves: No cranial nerve deficit.     Comments: Bilateral facial pain with a strong sensation.   Psychiatric:        Mood and Affect: Mood normal.        Behavior: Behavior normal.        Thought Content: Thought content normal.        Judgment: Judgment normal.         Assessment And Plan:     1. Facial pain Comments: - this may be trigeminal neuralgia vs TMJ  - will treat with a limited amount of Tramadole - she is to follow up with the providers who ordered Lyrica - traMADol (ULTRAM) 50 MG tablet; Take 1 tablet (50 mg total) by mouth every 6 (six) hours as needed.  Dispense: 20 tablet; Refill: 0     Patient was given opportunity to ask questions. Patient verbalized understanding of the plan and was able to repeat key elements of the plan. All questions were answered to their satisfaction.  Minette Brine, FNP   I,  Minette Brine, FNP, have reviewed all documentation for this visit. The documentation on 07/19/20 for the exam, diagnosis, procedures, and orders are all accurate and complete.  THE PATIENT IS ENCOURAGED TO PRACTICE SOCIAL DISTANCING DUE TO THE COVID-19 PANDEMIC.

## 2020-07-20 ENCOUNTER — Other Ambulatory Visit: Payer: Self-pay | Admitting: Nurse Practitioner

## 2020-07-20 ENCOUNTER — Other Ambulatory Visit: Payer: Self-pay

## 2020-07-20 DIAGNOSIS — R519 Headache, unspecified: Secondary | ICD-10-CM

## 2020-07-20 MED ORDER — TRAMADOL HCL 50 MG PO TABS: 50.0000 mg | ORAL_TABLET | Freq: Four times a day (QID) | ORAL | 0 refills | Status: DC | PRN

## 2020-07-26 ENCOUNTER — Other Ambulatory Visit: Payer: Self-pay

## 2020-07-26 ENCOUNTER — Ambulatory Visit: Payer: Medicare Other

## 2020-07-26 ENCOUNTER — Telehealth: Payer: Self-pay

## 2020-07-26 DIAGNOSIS — E1122 Type 2 diabetes mellitus with diabetic chronic kidney disease: Secondary | ICD-10-CM

## 2020-07-26 DIAGNOSIS — E7800 Pure hypercholesterolemia, unspecified: Secondary | ICD-10-CM

## 2020-07-26 DIAGNOSIS — N182 Chronic kidney disease, stage 2 (mild): Secondary | ICD-10-CM

## 2020-07-26 DIAGNOSIS — I10 Essential (primary) hypertension: Secondary | ICD-10-CM

## 2020-07-26 NOTE — Patient Instructions (Addendum)
Visit Information  Goals Addressed            This Visit's Progress   . Diabetes: Goal A1c <7%       CARE PLAN ENTRY  Current Barriers:  . Diabetes: controlled; complicated by chronic medical conditions including hypertension and high cholesterol Lab Results  Component Value Date   HGBA1C 6.2 (H) 05/04/2020 .   Lab Results  Component Value Date   CREATININE 0.76 05/04/2020   CREATININE 0.90 01/04/2020   CREATININE 0.99 09/01/2019 .  Kidney Function . Lab Results .  Component . Value . Date/Time .   Marland Kitchen CREATININE . 0.76 . 05/04/2020 11:38 AM .   . CREATININE . 0.90 . 01/04/2020 10:57 AM .   . GFRNONAA . 79 . 05/04/2020 11:38 AM .   . GFRAA . 91 . 05/04/2020 11:38 AM   . Current antihyperglycemic regimen: Ozempic 17m weekly . Denies hypoglycemic symptoms, including dizziness, lightheadedness, shaking, sweating . Denies hyperglycemic symptoms, including polyuria, polydipsia, polyphagia, nocturia, blurred vision, neuropathy . Exercise prior to should surgery: Walking program 15 minutes, 4-5 days per week . Current blood glucose readings: 90s . Cardiovascular risk reduction: o Current hypertensive regimen: Bisoprolol 556m 1/2 tablet daily o Current hyperlipidemia regimen: Rosuvastatin 2031maily o Current antiplatelet regimen: Aspirin 23m65mily  Pharmacist Clinical Goal(s):  . OvMarland Kitchenr the next 90 days, patient will work with PharmD and primary care provider to maintain A1c level  Interventions: . Comprehensive medication review performed, medication list updated in electronic medical record . Provided dietary and exercise recommendations . [employer] patient assistance program regarding status of shipment of Ozempic 1mg 26mtient Self Care Activities:  . Patient will check blood glucose if symptomatic over the next 90 days, document, and provide at future appointments  . Patient will take medications as prescribed daily over the next 90 days . Patient will contact  provider with any episodes of hypoglycemia . Patient will report any questions or concerns to provider  . Patient will try to exercise for 30 minutes 5 times weekly or a total of 150 minutes weekly  Please see past updates related to this goal by clicking on the "Past Updates" button in the selected goal      . High Blood Pressure: Goal <130/80mmH27m    CARE PLAN ENTRY  Current Barriers:  . Current antihypertensive regimen: Bisoprolol 5mg, 168mtablet by mouth daily . Previous antihypertensives tried: Lisinopril, losartan, Bystolic . Last practice recorded BP readings:  BP Readings from Last 3 Encounters:  08/21/20 118/70  07/19/20 140/78  06/14/20 132/84   . Current home BP readings: Does not check at home . Most recent eGFR/CrCl:  . Component . Value . Date/Time .   . GFRNOMarland KitchenAA . 64 . 01/04/2020 1057 .   . GFRAAMarland Kitchen. 74 . 01/04/2020 1057     Pharmacist Clinical Goal(s):  . Over Marland Kitchenhe next 90 days, patient will work with PharmD and providers to optimize antihypertensive regimen  Interventions: . Comprehensive medication review performed; medication list updated in the electronic medical record.  . Provided dietary and exercise recommendations . Discussed the possibility of obtaining home blood pressure monitor through insurance for a small fee  Patient Self Care Activities:  . Patient will resume exercise as able post-surgery over the next 90 days . Patient will continue to check BP if symptomatic, document, and provide at future appointments . Patient will focus on medication adherence by continuing to use pill box over the  next 90 days  Please see past updates related to this goal by clicking on the "Past Updates" button in the selected goal      . High Cholesterol Management: Goal LDL <70       CARE PLAN ENTRY  Current Barriers:  . Hyperlipidemia, complicated by diabetes . Current antihyperlipidemic regimen: Rosuvastatin 73m daily . No previous antihyperlipidemic  medications noted . Most recent lipid panel:     Component Value Date/Time   CHOL 146 05/04/2020 1138   TRIG 119 05/04/2020 1138   HDL 47 05/04/2020 1138   CHOLHDL 3.1 05/04/2020 1138   LDLCALC 78 05/04/2020 1138   . ASCVD risk enhancing conditions: age >>33 DM, HTN, CKD, CHF, current smoker . 10-year ASCVD risk score: 26.4%  Pharmacist Clinical Goal(s):  .Marland KitchenOver the next 90 days, patient will work with PharmD and providers towards optimized antihyperlipidemic therapy  Interventions: . Comprehensive medication review performed; medication list updated in electronic medical record.  . Provided dietary and exercise recommendations o Discussed the importance of exercise to meet cholesterol goal . Discussed appropriate goals for LDL (less than 70)  Patient Self Care Activities:  . Patient will focus on medication adherence by continuing to use pill box daily  . Patient will continue to focus on heart healthy diet over the next 90 days . Patient will try to exercise for 30 minutes daily 5 times per week or a total of 150 minutes weekly  Please see past updates related to this goal by clicking on the "Past Updates" button in the selected goal          The patient verbalized understanding of instructions provided today and agreed to receive a mailed copy of patient instruction and/or educational materials.  Telephone follow up appointment with pharmacy team member scheduled for: 10/04/20 @ 4:15 PM  CJannette Fogo PharmD Clinical Pharmacist [facility] [phone]  Diabetes Mellitus and Exercise Exercising regularly is important for your overall health, especially when you have diabetes (diabetes mellitus). Exercising is not only about losing weight. It has many other health benefits, such as increasing muscle strength and bone density and reducing body fat and stress. This leads to improved fitness, flexibility, and endurance, all of which result in  better overall health. Exercise has additional benefits for people with diabetes, including:  Reducing appetite.  Helping to lower and control blood glucose.  Lowering blood pressure.  Helping to control amounts of fatty substances (lipids) in the blood, such as cholesterol and triglycerides.  Helping the body to respond better to insulin (improving insulin sensitivity).  Reducing how much insulin the body needs.  Decreasing the risk for heart disease by: ? Lowering cholesterol and triglyceride levels. ? Increasing the levels of good cholesterol. ? Lowering blood glucose levels. What is my activity plan? Your health care provider or certified diabetes educator can help you make a plan for the type and frequency of exercise (activity plan) that works for you. Make sure that you:  Do at least 150 minutes of moderate-intensity or vigorous-intensity exercise each week. This could be brisk walking, biking, or water aerobics. ? Do stretching and strength exercises, such as yoga or weightlifting, at least 2 times a week. ? Spread out your activity over at least 3 days of the week.  Get some form of physical activity every day. ? Do not go more than 2 days in a row without some kind of physical activity. ? Avoid being inactive for more than 30 minutes  at a time. Take frequent breaks to walk or stretch.  Choose a type of exercise or activity that you enjoy, and set realistic goals.  Start slowly, and gradually increase the intensity of your exercise over time. What do I need to know about managing my diabetes?   Check your blood glucose before and after exercising. ? If your blood glucose is 240 mg/dL (13.3 mmol/L) or higher before you exercise, check your urine for ketones. If you have ketones in your urine, do not exercise until your blood glucose returns to normal. ? If your blood glucose is 100 mg/dL (5.6 mmol/L) or lower, eat a snack containing 15-20 grams of carbohydrate. Check  your blood glucose 15 minutes after the snack to make sure that your level is above 100 mg/dL (5.6 mmol/L) before you start your exercise.  Know the symptoms of low blood glucose (hypoglycemia) and how to treat it. Your risk for hypoglycemia increases during and after exercise. Common symptoms of hypoglycemia can include: ? Hunger. ? Anxiety. ? Sweating and feeling clammy. ? Confusion. ? Dizziness or feeling light-headed. ? Increased heart rate or palpitations. ? Blurry vision. ? Tingling or numbness around the mouth, lips, or tongue. ? Tremors or shakes. ? Irritability.  Keep a rapid-acting carbohydrate snack available before, during, and after exercise to help prevent or treat hypoglycemia.  Avoid injecting insulin into areas of the body that are going to be exercised. For example, avoid injecting insulin into: ? The arms, when playing tennis. ? The legs, when jogging.  Keep records of your exercise habits. Doing this can help you and your health care provider adjust your diabetes management plan as needed. Write down: ? Food that you eat before and after you exercise. ? Blood glucose levels before and after you exercise. ? The type and amount of exercise you have done. ? When your insulin is expected to peak, if you use insulin. Avoid exercising at times when your insulin is peaking.  When you start a new exercise or activity, work with your health care provider to make sure the activity is safe for you, and to adjust your insulin, medicines, or food intake as needed.  Drink plenty of water while you exercise to prevent dehydration or heat stroke. Drink enough fluid to keep your urine clear or pale yellow. Summary  Exercising regularly is important for your overall health, especially when you have diabetes (diabetes mellitus).  Exercising has many health benefits, such as increasing muscle strength and bone density and reducing body fat and stress.  Your health care provider or  certified diabetes educator can help you make a plan for the type and frequency of exercise (activity plan) that works for you.  When you start a new exercise or activity, work with your health care provider to make sure the activity is safe for you, and to adjust your insulin, medicines, or food intake as needed. This information is not intended to replace advice given to you by your health care provider. Make sure you discuss any questions you have with your health care provider. Document Revised: 07/10/2017 Document Reviewed: 05/27/2016 Elsevier Patient Education  [place].

## 2020-07-26 NOTE — Chronic Care Management (AMB) (Signed)
Chronic Care Management Pharmacy Assistant   Name: [name]  MRN: [id number] DOB: 11/06/1946  Reason for Encounter: Medication Review/Patient Assistance Follow Up  PCP : [name], MD  Allergies:   Allergies  Allergen Reactions  . Other Itching    HAIR DYE    Medications: Outpatient Encounter Medications as of 07/26/2020  Medication Sig  . Accu-Chek FastClix Lancets MISC Inject 1 each as directed daily. Use as directed to check blood sugars daily dx: e11.22  . acetaminophen (TYLENOL) 500 MG tablet Take 2 tablets by mouth every 6 (six) hours as needed. Patient taking every 6-8 hours as needed post surgery (Patient not taking: Reported on 07/19/2020)  . albuterol (VENTOLIN HFA) 108 (90 Base) MCG/ACT inhaler INHALE 2 PUFFS INTO THE LUNGS EVERY 4 HOURS AS NEEDED FOR WHEEZING OR SHORTNESS OF BREATH  . aspirin 81 MG tablet Take 81 mg by mouth daily.   . Biotin 10000 MCG TBDP Take by mouth.  . bisoprolol (ZEBETA) 5 MG tablet TAKE 1/2 TABLET(2.5 MG) BY MOUTH DAILY  . CALCIUM-MAGNESIUM-ZINC PO Take 1 capsule by mouth daily.  . Cholecalciferol (VITAMIN D3) 2000 UNITS TABS Take 2,000 Units by mouth daily.  . famotidine (PEPCID) 20 MG tablet Take 1 tablet (20 mg total) by mouth at bedtime.  Marland Kitchen glucose blood (ACCU-CHEK GUIDE) test strip 1 each by Other route daily. Use as instructed to check blood sugars 1 time per day dx: e11.22  . ipratropium-albuterol (DUONEB) 0.5-2.5 (3) MG/3ML SOLN Take 3 mLs by nebulization every 6 (six) hours as needed. DX: J44.9  . lidocaine (LIDODERM) 5 % Place 3 patches onto the skin daily. Remove & Discard patch within 12 hours or as directed by MD  . Magnesium 250 MG TABS Take 1 tablet by mouth daily.  . montelukast (SINGULAIR) 10 MG tablet TAKE 1 TABLET(10 MG) BY MOUTH AT BEDTIME  . pantoprazole (PROTONIX) 40 MG tablet Take 24min before the first meal of the day.  . rosuvastatin (CRESTOR) 20 MG tablet TAKE 1 TABLET(20 MG) BY MOUTH AT BEDTIME  .  Semaglutide,0.25 or 0.5MG /DOS, (OZEMPIC, 0.25 OR 0.5 MG/DOSE,) 2 MG/1.5ML SOPN Inject 0.5 mg into the skin once a week. (Patient taking differently: Inject 1 mg into the skin once a week. )  . SYMBICORT 80-4.5 MCG/ACT inhaler INHALE 2 PUFFS BY MOUTH TWICE DAILY  . traMADol (ULTRAM) 50 MG tablet Take 1 tablet (50 mg total) by mouth every 6 (six) hours as needed.   No facility-administered encounter medications on file as of 07/26/2020.    Current Diagnosis: Patient Active Problem List   Diagnosis Date Noted  . High cholesterol   . Type II diabetes mellitus, uncontrolled ([place]) 11/09/2018  . Diarrhea   . Community acquired pneumonia of left lower lobe of lung   . Rhinovirus infection 10/02/2016  . Acute asthma exacerbation 10/02/2016  . COPD with acute exacerbation ([place]) 09/28/2016  . Hypoxia 09/28/2016  . SOB (shortness of breath) 09/28/2016  . COPD exacerbation ([place]) 09/28/2016  . Essential hypertension 11/03/2014  . Cough 10/19/2014  . COPD GOLD II  03/21/2014  . Rhinitis, nonallergic 03/21/2014     Follow-Up:  Patient [place] regarding patient Ozempic patient assistance medication, due to high wait times, tried automated refill shipment status but the only information retrieved was for Ozempic 0.25/0.5mg  that was shipped on 06/28/2020, tracking #[id number], patient has that medication but now she is on 1mg . New refill order was sent on 07/04/20. LM for [employer]  to call back regarding medication status. Tm 07/28/2020- Spoke with Kayla from [employer], forms were received  And shipped on 07/27/2020, will take 10-14 days to receive. [place], [place] Pharmacist Assistant [phone]

## 2020-07-26 NOTE — Chronic Care Management (AMB) (Signed)
Chronic Care Management Pharmacy  Name: [name]  MRN: [id number] DOB: [birth year]  Chief Complaint/ HPI  [name],  74 y.o. , female presents for their Follow-Up CCM visit with the clinical pharmacist via telephone due to COVID-19 Pandemic.  PCP : [name], MD  Their chronic conditions include: Hypertension, COPD with chronic cough, diabetes, and high cholesterol  Office Visits: 07/19/20 OV: Presented with complaints of facial pain, sensitive to touch for the last 5 days. Start tramadol 9m every 6 hours as needed for possible trigeminal neuralgia.   07/18/20 Patient message: Pt complained of facial pain and cough. Requesting medication before leaving for vacation. Advised she would need to be evaluated before medications were prescribed.   06/29/20 Telephone call: Pt advised Ozempic arrived at office today from [employer]. Pt called back and stated what she picked up was 0.520mnot the 39m89mhe has been using  06/14/20 OV: Presented stating she has been wheezing and having trouble breathing for the past 2 weeks. Has a productive cough. Pulmonologist was unable to see pt. Has used inhalers and nebulizer with no relief. Started on prednisone taper and administered Kenalog 63m10mot in office. Encouraged to use nebulizer ever 6 hours as needed.   05/04/20 OV: Labs ordered (HgbA1c, Lipid panel, CMP14+EGFR). Rx for Shingrix sent to local pharmacy. Advised to wait 90 days after second COVID shot to start Shingrix series. Kidney function and A1c improved. Advised to resume walking once able to get LDL down to below 70.  01/04/20: OV with Dr. SandBaird Canceresented for DM check. HgbA1c 6.6% , patient assistance for Ozempic completed. BMP8+EGFR ordered (stable). Vitamin B12 and TSH ordered relating to right leg paresthesias (results w/in normal range), sees Ortho. Rx for Prevnar13 sent to Pharmacy. Follow up in 4 months for re-evaluation.  Consult  Visit: 07/18/20 Patient message: Requested refill for oxycodone or tramadol for cough, but advised she would need to make an appointment in order to get a refill. NP states she would not prescribe either medication for cough. Pt declined to make an appointment at this time.   Patient reported she had surgery on her left shoulder last week (week of April 18th) to repair tendon tears and bone spurs that were causing significant pain. Will wear sling for 6 weeks and perform physical therapy for 3 months. Methocarbamol, oxycodone, acetaminophen, and celecoxib given after surgery. At follow up visit with surgery yesterday patient started on cyclobenzaprine and ketorolac and told to stop celecoxib until she finished 3 days of ketorolac.   02/07/20- CCM Encounter with Dr. PruiBlanca Friendtient assistance for Symbicort application started. Comprehensive medication review.  01/17/20- CCM Encounter with Dr. PruiBlanca Friendtient assistance for Ozempic submitted on 01/12/20. Comprehensive medication review.   12/16/19: Pulmonology Virtual visit with Dr. WertMelvyn Novasronic cough addressed. Reflux, asthma, or post nasal drip syndrome could contribute to the cycle of cough. Prednisone taper, ZPak, Mucinex DM and oxycodone 5mg 68men. Gabapentin also increased to 300mg 65m times daily. Patient instructed to stop albuterol HFA and just use Duoneb solution.   12/08/19: CCM encounter with Dr. PruittBlanca Friendrehensive medication review  Medications: Outpatient Encounter Medications as of 07/26/2020  Medication Sig  . Accu-Chek FastClix Lancets MISC Inject 1 each as directed daily. Use as directed to check blood sugars daily dx: e11.22  . acetaminophen (TYLENOL) 500 MG tablet Take 2 tablets by mouth every 6 (six) hours as needed. Patient taking every 6-8 hours as needed post surgery (Patient not taking:  Reported on 07/19/2020)  . albuterol (VENTOLIN HFA) 108 (90 Base) MCG/ACT inhaler INHALE 2 PUFFS INTO THE LUNGS EVERY 4 HOURS AS NEEDED FOR  WHEEZING OR SHORTNESS OF BREATH  . aspirin 81 MG tablet Take 81 mg by mouth daily.   . Biotin 10000 MCG TBDP Take by mouth.  . bisoprolol (ZEBETA) 5 MG tablet TAKE 1/2 TABLET(2.5 MG) BY MOUTH DAILY  . CALCIUM-MAGNESIUM-ZINC PO Take 1 capsule by mouth daily.  . Cholecalciferol (VITAMIN D3) 2000 UNITS TABS Take 2,000 Units by mouth daily.  . famotidine (PEPCID) 20 MG tablet Take 1 tablet (20 mg total) by mouth at bedtime.  Marland Kitchen glucose blood (ACCU-CHEK GUIDE) test strip 1 each by Other route daily. Use as instructed to check blood sugars 1 time per day dx: e11.22  . lidocaine (LIDODERM) 5 % Place 3 patches onto the skin daily. Remove & Discard patch within 12 hours or as directed by MD  . Magnesium 250 MG TABS Take 1 tablet by mouth daily.  . montelukast (SINGULAIR) 10 MG tablet TAKE 1 TABLET(10 MG) BY MOUTH AT BEDTIME  . pantoprazole (PROTONIX) 40 MG tablet Take 71mn before the first meal of the day.  . rosuvastatin (CRESTOR) 20 MG tablet TAKE 1 TABLET(20 MG) BY MOUTH AT BEDTIME  . Semaglutide,0.25 or 0.5MG/DOS, (OZEMPIC, 0.25 OR 0.5 MG/DOSE,) 2 MG/1.5ML SOPN Inject 0.5 mg into the skin once a week. (Patient taking differently: Inject 1 mg into the skin once a week. )  . SYMBICORT 80-4.5 MCG/ACT inhaler INHALE 2 PUFFS BY MOUTH TWICE DAILY  . traMADol (ULTRAM) 50 MG tablet Take 1 tablet (50 mg total) by mouth every 6 (six) hours as needed.  . [DISCONTINUED] ipratropium-albuterol (DUONEB) 0.5-2.5 (3) MG/3ML SOLN Take 3 mLs by nebulization every 6 (six) hours as needed. DX: J44.9   No facility-administered encounter medications on file as of 07/26/2020.   Current Diagnosis/Assessment: SDOH Interventions     Most Recent Value  SDOH Interventions  Financial Strain Interventions Other (Comment)  [Assist with obtaining refills of Ozempic from [employer] assistance program]     Goals Addressed            This Visit's Progress   . Diabetes: Goal A1c <7%       CARE PLAN ENTRY  Current  Barriers:  . Diabetes: controlled; complicated by chronic medical conditions including hypertension and high cholesterol Lab Results  Component Value Date   HGBA1C 6.2 (H) 05/04/2020 .   Lab Results  Component Value Date   CREATININE 0.76 05/04/2020   CREATININE 0.90 01/04/2020   CREATININE 0.99 09/01/2019 .  Kidney Function . Lab Results .  Component . Value . Date/Time .   .Marland KitchenCREATININE . 0.76 . 05/04/2020 11:38 AM .   . CREATININE . 0.90 . 01/04/2020 10:57 AM .   . GFRNONAA . 79 . 05/04/2020 11:38 AM .   . GFRAA . 91 . 05/04/2020 11:38 AM   . Current antihyperglycemic regimen: Ozempic 1358mweekly . Denies hypoglycemic symptoms, including dizziness, lightheadedness, shaking, sweating . Denies hyperglycemic symptoms, including polyuria, polydipsia, polyphagia, nocturia, blurred vision, neuropathy . Exercise prior to should surgery: Walking program 15 minutes, 4-5 days per week . Current blood glucose readings: 90s . Cardiovascular risk reduction: o Current hypertensive regimen: Bisoprolol 58m40m1/2 tablet daily o Current hyperlipidemia regimen: Rosuvastatin 51m558mily o Current antiplatelet regimen: Aspirin 81mg12mly  Pharmacist Clinical Goal(s):  . OveMarland Kitchen the next 90 days, patient will work with PharmD and primary care provider  to maintain A1c level  Interventions: . Comprehensive medication review performed, medication list updated in electronic medical record . Provided dietary and exercise recommendations . [employer] patient assistance program regarding status of shipment of Ozempic 90m  Patient Self Care Activities:  . Patient will check blood glucose if symptomatic over the next 90 days, document, and provide at future appointments  . Patient will take medications as prescribed daily over the next 90 days . Patient will contact provider with any episodes of hypoglycemia . Patient will report any questions or concerns to provider  . Patient will try to exercise  for 30 minutes 5 times weekly or a total of 150 minutes weekly  Please see past updates related to this goal by clicking on the "Past Updates" button in the selected goal      . High Blood Pressure: Goal <130/8570mg       CARE PLAN ENTRY  Current Barriers:  . Current antihypertensive regimen: Bisoprolol 70m41m1/2 tablet by mouth daily . Previous antihypertensives tried: Lisinopril, losartan, Bystolic . Last practice recorded BP readings:  BP Readings from Last 3 Encounters:  08/21/20 118/70  07/19/20 140/78  06/14/20 132/84   . Current home BP readings: Does not check at home . Most recent eGFR/CrCl:  . Component . Value . Date/Time .   . GMarland KitchenRNONAA . 64 . 01/04/2020 1057 .   . GMarland KitchenRAA . 74 . 01/04/2020 1057     Pharmacist Clinical Goal(s):  . OMarland Kitchener the next 90 days, patient will work with PharmD and providers to optimize antihypertensive regimen  Interventions: . Comprehensive medication review performed; medication list updated in the electronic medical record.  . Provided dietary and exercise recommendations . Discussed the possibility of obtaining home blood pressure monitor through insurance for a small fee  Patient Self Care Activities:  . Patient will resume exercise as able post-surgery over the next 90 days . Patient will continue to check BP if symptomatic, document, and provide at future appointments . Patient will focus on medication adherence by continuing to use pill box over the next 90 days  Please see past updates related to this goal by clicking on the "Past Updates" button in the selected goal      . High Cholesterol Management: Goal LDL <70       CARE PLAN ENTRY  Current Barriers:  . Hyperlipidemia, complicated by diabetes . Current antihyperlipidemic regimen: Rosuvastatin 31m21mily . No previous antihyperlipidemic medications noted . Most recent lipid panel:     Component Value Date/Time   CHOL 146 05/04/2020 1138   TRIG 119 05/04/2020 1138   HDL 47  05/04/2020 1138   CHOLHDL 3.1 05/04/2020 1138   LDLCALC 78 05/04/2020 1138   . ASCVD risk enhancing conditions: age >65,>29, HTN, CKD, CHF, current smoker . 10-year ASCVD risk score: 26.4%  Pharmacist Clinical Goal(s):  . OvMarland Kitchenr the next 90 days, patient will work with PharmD and providers towards optimized antihyperlipidemic therapy  Interventions: . Comprehensive medication review performed; medication list updated in electronic medical record.  . Provided dietary and exercise recommendations o Discussed the importance of exercise to meet cholesterol goal . Discussed appropriate goals for LDL (less than 70)  Patient Self Care Activities:  . Patient will focus on medication adherence by continuing to use pill box daily  . Patient will continue to focus on heart healthy diet over the next 90 days . Patient will try to exercise for 30 minutes daily 5 times per week or  a total of 150 minutes weekly  Please see past updates related to this goal by clicking on the "Past Updates" button in the selected goal          COPD with chronic cough   Last spirometry score: FEV1 1.1 (58% predicted) , FVC 2.2, FEV/FVC 52% on 04/14/17  Gold Grade: Gold 2 (FEV1 50-79%)  Eosinophil count:   Lab Results  Component Value Date/Time   EOSPCT 2 09/10/2017 06:36 AM                                 Eos (Absolute):  Lab Results  Component Value Date/Time   EOSABS 0.1 09/10/2017 06:36 AM   Tobacco Status:  Social History   Tobacco Use  Smoking Status Former Smoker  . Packs/day: 0.25  . Years: 42.00  . Pack years: 10.50  . Types: Cigarettes  . Quit date: 01/30/2006  . Years since quitting: 14.5  Smokeless Tobacco Never Used   Patient has failed these meds in past: Breo, Dulera, Spiriva Patient is currently controlled on the following medications:   Symbicort 80-4.21mg/act 2 puffs twice daily  Ventolin 2 puffs into lungs every 4 hours as needed for wheezing/ SOB  Duoneb 1 vial every 6  hours prn  Montelukast 111mdaily  Chronic cough currently treated with the following medications:  Famotidine 2055maily  Pantoprazole 19m57mily  Gabapentin 300mg37mimes daily (patient only taking twice daily)  Using maintenance inhaler regularly? Yes Frequency of rescue inhaler use:  infrequently; patient reports she has not had to use Ventolin or Duoneb since January  We discussed:   Importance of compliance with maintenance inhaler; patient reports 100% compliance  Pt states breathing has been better recently  Plan Continue current medications ,  Diabetes   Recent Relevant Labs: Lab Results  Component Value Date/Time   HGBA1C 6.2 (H) 05/04/2020 11:38 AM   HGBA1C 6.6 (H) 01/04/2020 10:57 AM   MICROALBUR 10 09/01/2019 02:42 PM   MICROALBUR 30 02/03/2019 05:00 PM   Kidney Function Lab Results  Component Value Date   CREATININE 0.76 05/04/2020   CREATININE 0.90 01/04/2020   CREATININE 0.99 09/01/2019      Component Value Date/Time   GFRNONAA 79 05/04/2020 1138   GFRAA 91 05/04/2020 1138    Stage 2 CKD- Stable  Checking BG: Rarely  Patient has failed these meds in past: Victoza Patient is currently controlled on the following medications:   Ozempic 1mg w32mly  Last diabetic Foot exam: 09/01/19 Last diabetic Eye exam: 10/19/19 Lab Results  Component Value Date/Time   HMDIABEYEEXA No Retinopathy 10/19/2019 12:00 AM    We discussed:  Checked BG for about a week when having facial problems and reports that it was not high (90s) Diet and exercise extensively  Does not eat breakfast (just a cup of coffee)  Does not often eat lunch  Sometimes snacks on fruit, tomato sandwich  Meat and vegetables for dinner  Advised pt to limit carbohydrates, but don't cut out completely  Pt drinks about a half gallon of water daily  Recommended amount is 64 ounces daily  Pt is doing exercise program on Youtube called "Mile a day for beginners" which was about 15  minutes of exercises usually 4-5 days per week.  Doing physical therapy twice weekly for shoulder (helping with shoulder pain)  Encouraged patient to resume exercising as soon as possible post-surgery with a goal of  150 minutes per week.   Discussed goal 30 minutes 5 times weekly  Checking on Ozempic 67m shipment from [employer] program  Plan Continue current medications   Hypertension   Office blood pressures are  BP Readings from Last 3 Encounters:  08/21/20 118/70  07/19/20 140/78  06/14/20 132/84   Patient has failed these meds in the past: Lisinopril, losartan, Bystolic -ACEI/ARB contraindicated/not tolerated in this patient due to history of chronic cough  Patient checks BP at home Never, does not have a meter at home to check with  Patient is currently controlled on the following medications:   Bisoprolol 531m1/2 tablet daily  We discussed  Diet and exercise extensively  Recommend check BP periodically  Pt does not have BP meter at home and does not want to purchase one right now  Advised that she can obtain through insurance, but there will be a small fee  Plan Continue current medications    High Cholesterol   Lipid Panel     Component Value Date/Time   CHOL 146 05/04/2020 1138   TRIG 119 05/04/2020 1138   HDL 47 05/04/2020 1138   CHOLHDL 3.1 05/04/2020 1138   LDEffingham8 05/04/2020 1138   LABVLDL 21 05/04/2020 1138    The ASCVD Risk score (Goff DC Jr., et al., 2013) failed to calculate for the following reasons:   Unable to determine if patient is Non-Hispanic African American   Patient has failed these meds in past: N/A Patient is currently uncontrolled on the following medications:   Rosuvastatin 2011maily  Aspirin 70m72mWe discussed:   Diet and exercise extensively  Increasing exercise as recommended could help pt reach cholesterol goal  Plan Continue current medications   Pain Management Post-Surgery    Patient initially used methocarbamol 500mg44mry 6 to 8 hours as needed, celecoxib 100mg 57me daily, acetaminophen 500mg 289mlets every 6 to 8 hours as needed, and oxycodone 5mg 2 t41mets every 4 hours as needed. Patient stopped oxycodone after a few days due to ineffectiveness.  At follow up appointment with surgery on 4/27, patient was given cyclobenzaprine 10mg eve58m hours and ketorolac 10mg ever15mhours for 3 days. Told to hold celecoxib until ketorolac course finished.  We discussed:  No longer taking pain medications or muscle relaxers for pain post shoulder surgery (says they were not working anyway)  Would like to get epidural, but cannot position arm properly (straight out) right now. Working on this at therapy  Shoulder pain is tolerable now, but neck pain is causing issues  Physical therapy has helped with shoulder  Plan Continue Physical therapy as indicated  Health Maintenance   Patient is currently  on the following medications:   Biotin 10,000mcg dail68mr her nails (when she has it)  Calcium 1000mg/Mag 4070mZinc 1510m/ Vitam100m 15mcg daily  C59mcalciferol 2000 units daily  Fiber gummy (4g) twice daily  We discussed:    Patient is now taking a fiber supplement daily to help with weight management  Plan Continue current medications  Vaccines   Reviewed and discussed patient's vaccination history.    Immunization History  Administered Date(s) Administered  . Influenza Split 09/29/2013, 09/30/2015  . Influenza Whole 08/30/2016  . Influenza, High Dose Seasonal PF 09/29/2017, 09/14/2018, 09/01/2019  . Influenza,inj,Quad PF,6+ Mos 09/12/2014  . Influenza-Unspecified 09/14/2018  . PFIZER SARS-COV-2 Vaccination 01/19/2020, 02/09/2020  . Pneumococcal Conjugate-13 01/06/2020  . Pneumococcal Polysaccharide-23 10/07/2013  . Pneumococcal-Unspecified 10/05/2013  .  Tdap 06/07/2019, 06/09/2019   Patient received Prevnar at pharmacy 01/06/20  We  discussed:  Pt has not received Shingrix vaccine yet, but is planning to go and get it soon  Plan Recommended patient receive Shingrix vaccine series at pharmacy.   Medication Management   Pt uses [facility] pharmacy for all medications Uses pill box? Yes Pt endorses 100% compliance with maintenance medications  We discussed the importance of medication adherence.  Plan Continue current medication management strategy  Follow up: 3 month phone visit  Jannette Fogo, PharmD Clinical Pharmacist [facility] [phone]

## 2020-08-01 DIAGNOSIS — M25512 Pain in left shoulder: Secondary | ICD-10-CM | POA: Diagnosis not present

## 2020-08-01 DIAGNOSIS — S43422D Sprain of left rotator cuff capsule, subsequent encounter: Secondary | ICD-10-CM | POA: Diagnosis not present

## 2020-08-01 DIAGNOSIS — M6281 Muscle weakness (generalized): Secondary | ICD-10-CM | POA: Diagnosis not present

## 2020-08-01 DIAGNOSIS — M25612 Stiffness of left shoulder, not elsewhere classified: Secondary | ICD-10-CM | POA: Diagnosis not present

## 2020-08-03 DIAGNOSIS — S43422D Sprain of left rotator cuff capsule, subsequent encounter: Secondary | ICD-10-CM | POA: Diagnosis not present

## 2020-08-03 DIAGNOSIS — M25512 Pain in left shoulder: Secondary | ICD-10-CM | POA: Diagnosis not present

## 2020-08-03 DIAGNOSIS — M6281 Muscle weakness (generalized): Secondary | ICD-10-CM | POA: Diagnosis not present

## 2020-08-03 DIAGNOSIS — M25612 Stiffness of left shoulder, not elsewhere classified: Secondary | ICD-10-CM | POA: Diagnosis not present

## 2020-08-04 ENCOUNTER — Other Ambulatory Visit: Payer: Self-pay | Admitting: Internal Medicine

## 2020-08-08 ENCOUNTER — Telehealth: Payer: Self-pay

## 2020-08-08 ENCOUNTER — Encounter: Payer: Self-pay | Admitting: Internal Medicine

## 2020-08-08 DIAGNOSIS — S43422D Sprain of left rotator cuff capsule, subsequent encounter: Secondary | ICD-10-CM | POA: Diagnosis not present

## 2020-08-08 DIAGNOSIS — M25512 Pain in left shoulder: Secondary | ICD-10-CM | POA: Diagnosis not present

## 2020-08-08 DIAGNOSIS — M6281 Muscle weakness (generalized): Secondary | ICD-10-CM | POA: Diagnosis not present

## 2020-08-08 DIAGNOSIS — M25612 Stiffness of left shoulder, not elsewhere classified: Secondary | ICD-10-CM | POA: Diagnosis not present

## 2020-08-10 DIAGNOSIS — M6281 Muscle weakness (generalized): Secondary | ICD-10-CM | POA: Diagnosis not present

## 2020-08-10 DIAGNOSIS — M25512 Pain in left shoulder: Secondary | ICD-10-CM | POA: Diagnosis not present

## 2020-08-10 DIAGNOSIS — M25612 Stiffness of left shoulder, not elsewhere classified: Secondary | ICD-10-CM | POA: Diagnosis not present

## 2020-08-10 DIAGNOSIS — S43422D Sprain of left rotator cuff capsule, subsequent encounter: Secondary | ICD-10-CM | POA: Diagnosis not present

## 2020-08-11 NOTE — Progress Notes (Deleted)
Chronic Care Management Pharmacy Assistant   Name: [name]  MRN: [id number] DOB: 1946/08/18  Reason for Encounter: Medication Review/ Patient Assistance Follow Up  PCP : [name], MD  Allergies:   Allergies  Allergen Reactions   Other Itching    HAIR DYE    Medications: Outpatient Encounter Medications as of 08/08/2020  Medication Sig   Accu-Chek FastClix Lancets MISC Inject 1 each as directed daily. Use as directed to check blood sugars daily dx: e11.22   acetaminophen (TYLENOL) 500 MG tablet Take 2 tablets by mouth every 6 (six) hours as needed. Patient taking every 6-8 hours as needed post surgery (Patient not taking: Reported on 07/19/2020)   albuterol (VENTOLIN HFA) 108 (90 Base) MCG/ACT inhaler INHALE 2 PUFFS INTO THE LUNGS EVERY 4 HOURS AS NEEDED FOR WHEEZING OR SHORTNESS OF BREATH   aspirin 81 MG tablet Take 81 mg by mouth daily.    Biotin 10000 MCG TBDP Take by mouth.   bisoprolol (ZEBETA) 5 MG tablet TAKE 1/2 TABLET(2.5 MG) BY MOUTH DAILY   CALCIUM-MAGNESIUM-ZINC PO Take 1 capsule by mouth daily.   Cholecalciferol (VITAMIN D3) 2000 UNITS TABS Take 2,000 Units by mouth daily.   famotidine (PEPCID) 20 MG tablet Take 1 tablet (20 mg total) by mouth at bedtime.   glucose blood (ACCU-CHEK GUIDE) test strip 1 each by Other route daily. Use as instructed to check blood sugars 1 time per day dx: e11.22   ipratropium-albuterol (DUONEB) 0.5-2.5 (3) MG/3ML SOLN TAKE 3 MILLILITERS BY NEBULIZATION EVERY 6 HOURS AS NEEDED   ipratropium-albuterol (DUONEB) 0.5-2.5 (3) MG/3ML SOLN TAKE 3 MILLILITERS BY NEBULIZATION EVERY 6 HOURS AS NEEDED   lidocaine (LIDODERM) 5 % Place 3 patches onto the skin daily. Remove & Discard patch within 12 hours or as directed by MD   Magnesium 250 MG TABS Take 1 tablet by mouth daily.   montelukast (SINGULAIR) 10 MG tablet TAKE 1 TABLET(10 MG) BY MOUTH AT BEDTIME   pantoprazole (PROTONIX) 40 MG tablet Take 18min before the  first meal of the day.   rosuvastatin (CRESTOR) 20 MG tablet TAKE 1 TABLET(20 MG) BY MOUTH AT BEDTIME   Semaglutide,0.25 or 0.5MG /DOS, (OZEMPIC, 0.25 OR 0.5 MG/DOSE,) 2 MG/1.5ML SOPN Inject 0.5 mg into the skin once a week. (Patient taking differently: Inject 1 mg into the skin once a week. )   SYMBICORT 80-4.5 MCG/ACT inhaler INHALE 2 PUFFS BY MOUTH TWICE DAILY   traMADol (ULTRAM) 50 MG tablet Take 1 tablet (50 mg total) by mouth every 6 (six) hours as needed.   No facility-administered encounter medications on file as of 08/08/2020.    Current Diagnosis: Patient Active Problem List   Diagnosis Date Noted   High cholesterol    Type II diabetes mellitus, uncontrolled ([place]) 11/09/2018   Diarrhea    Community acquired pneumonia of left lower lobe of lung    Rhinovirus infection 10/02/2016   Acute asthma exacerbation 10/02/2016   COPD with acute exacerbation ([place]) 09/28/2016   Hypoxia 09/28/2016   SOB (shortness of breath) 09/28/2016   COPD exacerbation ([place]) 09/28/2016   Essential hypertension 11/03/2014   Cough 10/19/2014   COPD GOLD II  03/21/2014   Rhinitis, nonallergic 03/21/2014     Follow-Up:  Patient [place] to follow up on patient assistance form sent on 07/27/20 for Ozempic 1 mg. Spoke with [place], she said medication was sent to shipping department on 08/01/2020 and it takes 10- 14 days to reach the office.  Medication should be at the office between 08/15/20 and 08/21/20, if medications is not received by 08/21/20, we can call on 08/22/20 to get patient a free 30 day voucher to use at her local pharmacy. Jannette Fogo, CPP aware. Left message on patient voicemail of information.   Pattricia Boss, [place] Pharmacist Assistant [phone]

## 2020-08-11 NOTE — Chronic Care Management (AMB) (Signed)
Chronic Care Management Pharmacy Assistant   Name: TEIGHAN AUBERT  MRN: [id number] DOB: 08-31-1946  Reason for Encounter: Medication Review/ Patient Assistance Follow Up  PCP : [name], MD  Allergies:   Allergies  Allergen Reactions  . Other Itching    HAIR DYE    Medications: Outpatient Encounter Medications as of 08/08/2020  Medication Sig  . Accu-Chek FastClix Lancets MISC Inject 1 each as directed daily. Use as directed to check blood sugars daily dx: e11.22  . acetaminophen (TYLENOL) 500 MG tablet Take 2 tablets by mouth every 6 (six) hours as needed. Patient taking every 6-8 hours as needed post surgery (Patient not taking: Reported on 07/19/2020)  . albuterol (VENTOLIN HFA) 108 (90 Base) MCG/ACT inhaler INHALE 2 PUFFS INTO THE LUNGS EVERY 4 HOURS AS NEEDED FOR WHEEZING OR SHORTNESS OF BREATH  . aspirin 81 MG tablet Take 81 mg by mouth daily.   . Biotin 10000 MCG TBDP Take by mouth.  . bisoprolol (ZEBETA) 5 MG tablet TAKE 1/2 TABLET(2.5 MG) BY MOUTH DAILY  . CALCIUM-MAGNESIUM-ZINC PO Take 1 capsule by mouth daily.  . Cholecalciferol (VITAMIN D3) 2000 UNITS TABS Take 2,000 Units by mouth daily.  . famotidine (PEPCID) 20 MG tablet Take 1 tablet (20 mg total) by mouth at bedtime.  Marland Kitchen glucose blood (ACCU-CHEK GUIDE) test strip 1 each by Other route daily. Use as instructed to check blood sugars 1 time per day dx: e11.22  . ipratropium-albuterol (DUONEB) 0.5-2.5 (3) MG/3ML SOLN TAKE 3 MILLILITERS BY NEBULIZATION EVERY 6 HOURS AS NEEDED  . ipratropium-albuterol (DUONEB) 0.5-2.5 (3) MG/3ML SOLN TAKE 3 MILLILITERS BY NEBULIZATION EVERY 6 HOURS AS NEEDED  . lidocaine (LIDODERM) 5 % Place 3 patches onto the skin daily. Remove & Discard patch within 12 hours or as directed by MD  . Magnesium 250 MG TABS Take 1 tablet by mouth daily.  . montelukast (SINGULAIR) 10 MG tablet TAKE 1 TABLET(10 MG) BY MOUTH AT BEDTIME  . pantoprazole (PROTONIX) 40 MG tablet Take 70min before the  first meal of the day.  . rosuvastatin (CRESTOR) 20 MG tablet TAKE 1 TABLET(20 MG) BY MOUTH AT BEDTIME  . Semaglutide,0.25 or 0.5MG /DOS, (OZEMPIC, 0.25 OR 0.5 MG/DOSE,) 2 MG/1.5ML SOPN Inject 0.5 mg into the skin once a week. (Patient taking differently: Inject 1 mg into the skin once a week. )  . SYMBICORT 80-4.5 MCG/ACT inhaler INHALE 2 PUFFS BY MOUTH TWICE DAILY  . traMADol (ULTRAM) 50 MG tablet Take 1 tablet (50 mg total) by mouth every 6 (six) hours as needed.   No facility-administered encounter medications on file as of 08/08/2020.    Current Diagnosis: Patient Active Problem List   Diagnosis Date Noted  . High cholesterol   . Type II diabetes mellitus, uncontrolled ([place]) 11/09/2018  . Diarrhea   . Community acquired pneumonia of left lower lobe of lung   . Rhinovirus infection 10/02/2016  . Acute asthma exacerbation 10/02/2016  . COPD with acute exacerbation ([place]) 09/28/2016  . Hypoxia 09/28/2016  . SOB (shortness of breath) 09/28/2016  . COPD exacerbation ([place]) 09/28/2016  . Essential hypertension 11/03/2014  . Cough 10/19/2014  . COPD GOLD II  03/21/2014  . Rhinitis, nonallergic 03/21/2014     Follow-Up:  Patient [place] to follow up on patient assistance form sent on 07/27/20 for Ozempic 1 mg. Spoke with Steele, she said medication was sent to shipping department on 08/01/2020 and it takes 10- 14 days to reach the office.  Medication should be at the office between 08/15/20 and 08/21/20, if medications is not received by 08/21/20, we can call on 08/22/20 to get patient a free 30 day voucher to use at her local pharmacy. Jannette Fogo, CPP aware. Left message on patient voicemail of information.   Pattricia Boss, [place] Pharmacist Assistant [phone]

## 2020-08-15 DIAGNOSIS — S43422D Sprain of left rotator cuff capsule, subsequent encounter: Secondary | ICD-10-CM | POA: Diagnosis not present

## 2020-08-15 DIAGNOSIS — M25512 Pain in left shoulder: Secondary | ICD-10-CM | POA: Diagnosis not present

## 2020-08-15 DIAGNOSIS — M25612 Stiffness of left shoulder, not elsewhere classified: Secondary | ICD-10-CM | POA: Diagnosis not present

## 2020-08-15 DIAGNOSIS — M6281 Muscle weakness (generalized): Secondary | ICD-10-CM | POA: Diagnosis not present

## 2020-08-17 DIAGNOSIS — M25512 Pain in left shoulder: Secondary | ICD-10-CM | POA: Diagnosis not present

## 2020-08-17 DIAGNOSIS — S43422D Sprain of left rotator cuff capsule, subsequent encounter: Secondary | ICD-10-CM | POA: Diagnosis not present

## 2020-08-17 DIAGNOSIS — M25612 Stiffness of left shoulder, not elsewhere classified: Secondary | ICD-10-CM | POA: Diagnosis not present

## 2020-08-17 DIAGNOSIS — M6281 Muscle weakness (generalized): Secondary | ICD-10-CM | POA: Diagnosis not present

## 2020-08-21 ENCOUNTER — Ambulatory Visit (INDEPENDENT_AMBULATORY_CARE_PROVIDER_SITE_OTHER): Payer: Medicare Other | Admitting: Pulmonary Disease

## 2020-08-21 ENCOUNTER — Ambulatory Visit (INDEPENDENT_AMBULATORY_CARE_PROVIDER_SITE_OTHER): Payer: Medicare Other

## 2020-08-21 ENCOUNTER — Encounter: Payer: Self-pay | Admitting: Pulmonary Disease

## 2020-08-21 ENCOUNTER — Other Ambulatory Visit: Payer: Self-pay

## 2020-08-21 VITALS — BP 118/70 | HR 80 | Temp 97.9°F | Ht 66.0 in | Wt 187.6 lb

## 2020-08-21 DIAGNOSIS — J449 Chronic obstructive pulmonary disease, unspecified: Secondary | ICD-10-CM | POA: Diagnosis not present

## 2020-08-21 DIAGNOSIS — Z720 Tobacco use: Secondary | ICD-10-CM | POA: Diagnosis not present

## 2020-08-21 DIAGNOSIS — Z87891 Personal history of nicotine dependence: Secondary | ICD-10-CM | POA: Diagnosis not present

## 2020-08-21 MED ORDER — BREZTRI AEROSPHERE 160-9-4.8 MCG/ACT IN AERO
2.0000 | INHALATION_SPRAY | Freq: Two times a day (BID) | RESPIRATORY_TRACT | 0 refills | Status: DC
Start: 2020-08-21 — End: 2020-09-14

## 2020-08-21 NOTE — Assessment & Plan Note (Signed)
Plan: Chest x-ray

## 2020-08-21 NOTE — Assessment & Plan Note (Signed)
Plan: Trial of Breztri  Stop Symbicort 80 Chest Xray today  8-week follow-up with our office

## 2020-08-21 NOTE — Progress Notes (Signed)
@Patient  ID: [name], female    DOB: [birth year], 74 y.o.   MRN: [id number]  Chief Complaint  Patient presents with  . Follow-up    copd    Referring provider: [name], MD  HPI:  74 year old female former smoker followed in our office for COPD  PMH: Hypertension, type 2 diabetes Smoker/ Smoking History: Former smoker Maintenance:  Symbicort 59 Pt of: Dr. [name]  08/21/2020  - Visit   74 year old female former smoker followed in our office for COPD.  She is followed by Dr. [name].  She was last seen December/2020.  She has received the COVID-19 vaccines.  Patient reports that she has had intermittent wheezing over the last couple of months.  She is also had to use her rescue inhaler more often.  She reports that over the last month she said using rescue inhaler daily.  Previously treated in June/2021 with prednisone as well as a steroid shot by primary care for COPD exacerbation.  Patient remains adherent to Symbicort 80.  Last pulmonary function testing in 2018 and 2017 confirm moderate to severe obstruction.  Questionaires / Pulmonary Flowsheets:   ACT:  No flowsheet data found.  MMRC: mMRC Dyspnea Scale mMRC Score  08/21/2020 1    Epworth:  No flowsheet data found.  Tests:   04/14/2017-spirometry-FVC 2.2 (87% predicted), ratio 52, FEV1 1.1 (58% predicted) Moderately severe obstruction  10/02/2016-pulmonary function test-FVC 1.32 (50% predicted), ratio 53, FEV1 0.7 (34% predicted), DLCO 6.18 (22% predicted)  09/12/2017-chest x-ray-stable pleural-parenchymal scarring at left lung base, no acute cardiopulmonary disease  FENO:  Lab Results  Component Value Date   NITRICOXIDE 14 04/14/2017    PFT: PFT Results Latest Ref Rng & Units 10/02/2016 03/17/2014  FVC-Pre L 1.32 1.88  FVC-Predicted Pre % 50 69  FVC-Post L - 1.93  FVC-Predicted Post % - 71  Pre FEV1/FVC % % 53 66  Post FEV1/FCV % % - 63  FEV1-Pre L 0.70 1.23  FEV1-Predicted Pre % 34 58   FEV1-Post L - 1.23  DLCO uncorrected ml/min/mmHg 6.18 15.24  DLCO UNC% % 22 56  DLCO corrected ml/min/mmHg 6.22 -  DLCO COR %Predicted % 23 -  DLVA Predicted % 50 77  TLC L - 4.57  TLC % Predicted % - 85  RV % Predicted % - 119    WALK:  No flowsheet data found.  Imaging: No results found.  Lab Results:  CBC    Component Value Date/Time   WBC 7.0 09/01/2019 1113   WBC 15.1 (H) 09/13/2017 0220   RBC 4.69 09/01/2019 1113   RBC 4.20 09/13/2017 0220   HGB 13.5 09/01/2019 1113   HCT 41.8 09/01/2019 1113   PLT 307 09/01/2019 1113   MCV 89 09/01/2019 1113   MCH 28.8 09/01/2019 1113   MCH 29.0 09/13/2017 0220   MCHC 32.3 09/01/2019 1113   MCHC 33.2 09/13/2017 0220   RDW 13.7 09/01/2019 1113   LYMPHSABS 2.3 09/10/2017 0636   MONOABS 0.7 09/10/2017 0636   EOSABS 0.1 09/10/2017 0636   BASOSABS 0.0 09/10/2017 0636    BMET    Component Value Date/Time   NA 141 05/04/2020 1138   K 4.7 05/04/2020 1138   CL 102 05/04/2020 1138   CO2 24 05/04/2020 1138   GLUCOSE 82 05/04/2020 1138   GLUCOSE 135 (H) 09/12/2017 0416   BUN 6 (L) 05/04/2020 1138   CREATININE 0.76 05/04/2020 1138   CALCIUM 9.7 05/04/2020 1138   GFRNONAA 79 05/04/2020 1138  GFRAA 91 05/04/2020 1138    BNP No results found for: BNP  ProBNP    Component Value Date/Time   PROBNP 5.0 02/28/2014 1503    Specialty Problems      Pulmonary Problems   COPD GOLD II     Quit smoking 2007 PFT: 03/17/2014-  FEV1  1.28 (58%) ratio 66 and no better p saba, ERV 27 and DLCO 56 corrects to 77 - 11/30/2014   try dulera 100 2bid > 01/03/2015 not clear helping,  use prn > flare since mid 02/2015 cough > sob  - 04/07/2015 p extensive coaching HFA effectiveness =    90% > try dulera 100 2bid > improved 04/21/15   - 06/01/2015   try dulera 200 2bid  > did not maintain  - Spirometry 04/14/2017  FEV1 1.13 (58%)  Ratio 52 p saba w/in 4 h > add symb 80 2bid   - 08/19/2017    resumed sym 80 2bid -  03/23/2018    try symb 160 due to  new noct cough/ wheeze > improved 04/20/2018  - 07/22/2018   try symb 80 2bid due to UACS - pulmonary eval at [facility] 2/267/20 rec add spiriva smi > pt declined         Rhinitis, nonallergic    Allergy profile 02/28/14-   total IgE 18.5 with Neg RAST        Cough    Followed in Pulmonary clinic/ [facility]/ [facility] in [employer] school daily cough/ "throat tickle" - allergy profile 03/10/14  >  IgE 18 with neg RAST - sinus ct 10/25/2014 > Clear sinuses. - try off cozar 11/03/2014 >>> - neurontin 100 tid rx 11/30/14 > improved 01/03/2015 > changed to lyrica 04/2015 but not taking consistently as of 01/30/2016  - sinus CT 02/17/15 > neg  - FENO  04/14/17 =  14  - rechallenged with gabapentin 300 qid 07/22/2018 and if not better > refer to Dr Joya Gaskins at [facility] voice center > did not go, referred again 01/07/2019 > see eval 03/02/2019 rec speech therapy/tramadol (pt refused latter)        COPD exacerbation ([place])   COPD with acute exacerbation ([place])   Hypoxia   SOB (shortness of breath)   Acute asthma exacerbation   Community acquired pneumonia of left lower lobe of lung      Allergies  Allergen Reactions  . Other Itching    HAIR DYE    Immunization History  Administered Date(s) Administered  . Influenza Split 09/29/2013, 09/30/2015  . Influenza Whole 08/30/2016  . Influenza, High Dose Seasonal PF 09/29/2017, 09/14/2018, 09/01/2019  . Influenza,inj,Quad PF,6+ Mos 09/12/2014  . Influenza-Unspecified 09/14/2018  . PFIZER SARS-COV-2 Vaccination 01/19/2020, 02/09/2020  . Pneumococcal Conjugate-13 01/06/2020  . Pneumococcal Polysaccharide-23 10/07/2013  . Pneumococcal-Unspecified 10/05/2013  . Tdap 06/07/2019, 06/09/2019    Past Medical History:  Diagnosis Date  . COPD (chronic obstructive pulmonary disease) ([place])   . Diabetes ([place])   . High cholesterol   . Hypertension   . Pneumonia 09/10/2017    Tobacco History: Social History   Tobacco Use  Smoking Status Former Smoker  .  Packs/day: 0.25  . Years: 42.00  . Pack years: 10.50  . Types: Cigarettes  . Quit date: 01/30/2006  . Years since quitting: 14.5  Smokeless Tobacco Never Used   Counseling given: Yes   Continue to not smoke  Outpatient Encounter Medications as of 08/21/2020  Medication Sig  . Accu-Chek FastClix Lancets MISC Inject 1 each  as directed daily. Use as directed to check blood sugars daily dx: e11.22  . albuterol (VENTOLIN HFA) 108 (90 Base) MCG/ACT inhaler INHALE 2 PUFFS INTO THE LUNGS EVERY 4 HOURS AS NEEDED FOR WHEEZING OR SHORTNESS OF BREATH  . aspirin 81 MG tablet Take 81 mg by mouth daily.   . Biotin 10000 MCG TBDP Take by mouth.  . bisoprolol (ZEBETA) 5 MG tablet TAKE 1/2 TABLET(2.5 MG) BY MOUTH DAILY  . CALCIUM-MAGNESIUM-ZINC PO Take 1 capsule by mouth daily.  . Cholecalciferol (VITAMIN D3) 2000 UNITS TABS Take 2,000 Units by mouth daily.  . famotidine (PEPCID) 20 MG tablet Take 1 tablet (20 mg total) by mouth at bedtime.  Marland Kitchen glucose blood (ACCU-CHEK GUIDE) test strip 1 each by Other route daily. Use as instructed to check blood sugars 1 time per day dx: e11.22  . ipratropium-albuterol (DUONEB) 0.5-2.5 (3) MG/3ML SOLN TAKE 3 MILLILITERS BY NEBULIZATION EVERY 6 HOURS AS NEEDED  . lidocaine (LIDODERM) 5 % Place 3 patches onto the skin daily. Remove & Discard patch within 12 hours or as directed by MD  . Magnesium 250 MG TABS Take 1 tablet by mouth daily.  . montelukast (SINGULAIR) 10 MG tablet TAKE 1 TABLET(10 MG) BY MOUTH AT BEDTIME  . pantoprazole (PROTONIX) 40 MG tablet Take 60min before the first meal of the day.  . pregabalin (LYRICA) 75 MG capsule   . rosuvastatin (CRESTOR) 20 MG tablet TAKE 1 TABLET(20 MG) BY MOUTH AT BEDTIME  . Semaglutide,0.25 or 0.5MG /DOS, (OZEMPIC, 0.25 OR 0.5 MG/DOSE,) 2 MG/1.5ML SOPN Inject 0.5 mg into the skin once a week. (Patient taking differently: Inject 1 mg into the skin once a week. )  . SYMBICORT 80-4.5 MCG/ACT inhaler INHALE 2 PUFFS BY MOUTH TWICE  DAILY  . traMADol (ULTRAM) 50 MG tablet Take 1 tablet (50 mg total) by mouth every 6 (six) hours as needed.  Marland Kitchen acetaminophen (TYLENOL) 500 MG tablet Take 2 tablets by mouth every 6 (six) hours as needed. Patient taking every 6-8 hours as needed post surgery (Patient not taking: Reported on 07/19/2020)  . Budeson-Glycopyrrol-Formoterol (BREZTRI AEROSPHERE) 160-9-4.8 MCG/ACT AERO Inhale 2 puffs into the lungs 2 (two) times daily.  Marland Kitchen ipratropium-albuterol (DUONEB) 0.5-2.5 (3) MG/3ML SOLN TAKE 3 MILLILITERS BY NEBULIZATION EVERY 6 HOURS AS NEEDED   No facility-administered encounter medications on file as of 08/21/2020.     Review of Systems  Review of Systems  Constitutional: Positive for fatigue. Negative for activity change and fever.  HENT: Negative for sinus pressure, sinus pain and sore throat.   Respiratory: Positive for cough and wheezing. Negative for shortness of breath.   Cardiovascular: Negative for chest pain and palpitations.  Gastrointestinal: Negative for diarrhea, nausea and vomiting.  Musculoskeletal: Negative for arthralgias.  Neurological: Negative for dizziness.  Psychiatric/Behavioral: Negative for sleep disturbance. The patient is not nervous/anxious.      Physical Exam  BP 118/70 (BP Location: Left Arm, Cuff Size: Normal)   Pulse 80   Temp 97.9 F (36.6 C) (Oral)   Ht 5\' 6"  (1.676 m)   Wt 187 lb 9.6 oz (85.1 kg)   SpO2 95%   BMI 30.28 kg/m   Wt Readings from Last 5 Encounters:  08/21/20 187 lb 9.6 oz (85.1 kg)  07/19/20 188 lb 6.4 oz (85.5 kg)  06/14/20 186 lb 9.6 oz (84.6 kg)  05/04/20 192 lb 6.4 oz (87.3 kg)  01/04/20 196 lb 3.2 oz (89 kg)    BMI Readings from Last 5 Encounters:  08/21/20  30.28 kg/m  07/19/20 31.55 kg/m  06/14/20 31.24 kg/m  05/04/20 32.21 kg/m  01/04/20 32.85 kg/m     Physical Exam Vitals and nursing note reviewed.  Constitutional:      General: She is not in acute distress.    Appearance: Normal appearance. She is  normal weight.  HENT:     Head: Normocephalic and atraumatic.     Right Ear: External ear normal.     Left Ear: External ear normal.  Eyes:     Pupils: Pupils are equal, round, and reactive to light.  Cardiovascular:     Rate and Rhythm: Normal rate and regular rhythm.     Pulses: Normal pulses.     Heart sounds: Normal heart sounds. No murmur heard.   Pulmonary:     Effort: Pulmonary effort is normal. No respiratory distress.     Breath sounds: No decreased air movement. No decreased breath sounds, wheezing or rales.  Musculoskeletal:     Cervical back: Normal range of motion.  Skin:    General: Skin is warm and dry.     Capillary Refill: Capillary refill takes less than 2 seconds.  Neurological:     General: No focal deficit present.     Mental Status: She is alert and oriented to person, place, and time. Mental status is at baseline.     Gait: Gait normal.  Psychiatric:        Mood and Affect: Mood normal.        Behavior: Behavior normal.        Thought Content: Thought content normal.        Judgment: Judgment normal.       Assessment & Plan:   COPD GOLD II  Plan: Trial of Breztri  Stop Symbicort 80 Chest Xray today  8-week follow-up with our office  Former smoker Plan: Chest x-ray    Return in about 2 months (around 10/21/2020), or if symptoms worsen or fail to improve, for Follow up with Wyn Quaker FNP-C, Follow up with Dr. [name].   Lauraine Rinne, NP 08/21/2020   This appointment required 25 minutes of patient care (this includes precharting, chart review, review of results, face-to-face care, etc.).

## 2020-08-21 NOTE — Patient Instructions (Addendum)
You were seen today by Lauraine Rinne, NP  for:  1. COPD mixed type ([place])  Trial of Breztri >>> 2 puffs in the morning right when you wake up, rinse out your mouth after use, 12 hours later 2 puffs, rinse after use >>> Take this daily, no matter what >>> This is not a rescue inhaler   Okay to stop Symbicort 80  Contact our office in 2 weeks and let us know how you are doing after starting the samples this can be either a MyChart message or a telephone call  Note your daily symptoms > remember "red flags" for COPD:   >>>Increase in cough >>>increase in sputum production >>>increase in shortness of breath or activity  intolerance.   If you notice these symptoms, please call the office to be seen.     2. Former smoker  - DG Chest 2 View; Future  Given the fact that you are a former smoker we will obtain a chest x-ray today   We recommend today:  Orders Placed This Encounter  Procedures  . DG Chest 2 View    Standing Status:   Future    Standing Expiration Date:   12/21/2020    Order Specific Question:   Reason for Exam (SYMPTOM  OR DIAGNOSIS REQUIRED)    Answer:   copd / former smoker    Order Specific Question:   Preferred imaging location?    Answer:   Internal    Order Specific Question:   Radiology Contrast Protocol - do NOT remove file path    Answer:   [email or web address]   Orders Placed This Encounter  Procedures  . DG Chest 2 View   Meds ordered this encounter  Medications  . Budeson-Glycopyrrol-Formoterol (BREZTRI AEROSPHERE) 160-9-4.8 MCG/ACT AERO    Sig: Inhale 2 puffs into the lungs 2 (two) times daily.    Dispense:  5.9 g    Refill:  0    Order Specific Question:   Lot Number?    Answer:   [id number]    Order Specific Question:   Expiration Date?    Answer:   10/30/2021    Order Specific Question:   Manufacturer?    Answer:   [employer] [[id number]]    Follow Up:    Return in about 2 months (around 10/21/2020), or if  symptoms worsen or fail to improve, for Follow up with Wyn Quaker FNP-C, Follow up with Dr. [name].   Please do your part to reduce the spread of COVID-19:      Reduce your risk of any infection  and COVID19 by using the similar precautions used for avoiding the common cold or flu:  Marland Kitchen Wash your hands often with soap and warm water for at least 20 seconds.  If soap and water are not readily available, use an alcohol-based hand sanitizer with at least 60% alcohol.  . If coughing or sneezing, cover your mouth and nose by coughing or sneezing into the elbow areas of your shirt or coat, into a tissue or into your sleeve (not your hands). Langley Gauss A MASK when in public  . Avoid shaking hands with others and consider head nods or verbal greetings only. . Avoid touching your eyes, nose, or mouth with unwashed hands.  . Avoid close contact with people who are sick. . Avoid places or events with large numbers of people in one location, like concerts or sporting events. . If you have some symptoms but not all symptoms, continue to monitor  at home and seek medical attention if your symptoms worsen. . If you are having a medical emergency, call 911.   [place] / e-Visit: [email or web address]         [facility]: [facility]: [phone]                   [facility]: [phone]     It is flu season:   >>> Best ways to protect herself from the flu: Receive the yearly flu vaccine, practice good hand hygiene washing with soap and also using hand sanitizer when available, eat a nutritious meals, get adequate rest, hydrate appropriately   Please contact the office if your symptoms worsen or you have concerns that you are not improving.   Thank you for choosing [facility] for your healthcare, and for allowing [place] to partner with you on  your healthcare journey. I am thankful to be able to provide care to you today.   Wyn Quaker FNP-C

## 2020-08-28 ENCOUNTER — Encounter: Payer: Self-pay | Admitting: Internal Medicine

## 2020-09-01 ENCOUNTER — Other Ambulatory Visit: Payer: Self-pay | Admitting: Internal Medicine

## 2020-09-01 DIAGNOSIS — M5412 Radiculopathy, cervical region: Secondary | ICD-10-CM | POA: Diagnosis not present

## 2020-09-04 ENCOUNTER — Encounter: Payer: Self-pay | Admitting: Internal Medicine

## 2020-09-06 ENCOUNTER — Encounter: Payer: Self-pay | Admitting: Internal Medicine

## 2020-09-06 ENCOUNTER — Telehealth: Payer: Self-pay

## 2020-09-06 ENCOUNTER — Ambulatory Visit (INDEPENDENT_AMBULATORY_CARE_PROVIDER_SITE_OTHER): Payer: Medicare Other

## 2020-09-06 ENCOUNTER — Other Ambulatory Visit: Payer: Self-pay

## 2020-09-06 ENCOUNTER — Ambulatory Visit (INDEPENDENT_AMBULATORY_CARE_PROVIDER_SITE_OTHER): Payer: Medicare Other | Admitting: Internal Medicine

## 2020-09-06 VITALS — BP 122/80 | HR 72 | Temp 98.0°F | Ht 66.0 in | Wt 189.2 lb

## 2020-09-06 DIAGNOSIS — Z0000 Encounter for general adult medical examination without abnormal findings: Secondary | ICD-10-CM

## 2020-09-06 DIAGNOSIS — I700 Atherosclerosis of aorta: Secondary | ICD-10-CM | POA: Diagnosis not present

## 2020-09-06 DIAGNOSIS — I129 Hypertensive chronic kidney disease with stage 1 through stage 4 chronic kidney disease, or unspecified chronic kidney disease: Secondary | ICD-10-CM | POA: Diagnosis not present

## 2020-09-06 DIAGNOSIS — E559 Vitamin D deficiency, unspecified: Secondary | ICD-10-CM | POA: Diagnosis not present

## 2020-09-06 DIAGNOSIS — E7800 Pure hypercholesterolemia, unspecified: Secondary | ICD-10-CM | POA: Diagnosis not present

## 2020-09-06 DIAGNOSIS — N182 Chronic kidney disease, stage 2 (mild): Secondary | ICD-10-CM | POA: Diagnosis not present

## 2020-09-06 DIAGNOSIS — E6609 Other obesity due to excess calories: Secondary | ICD-10-CM

## 2020-09-06 DIAGNOSIS — Z23 Encounter for immunization: Secondary | ICD-10-CM | POA: Diagnosis not present

## 2020-09-06 DIAGNOSIS — E1122 Type 2 diabetes mellitus with diabetic chronic kidney disease: Secondary | ICD-10-CM | POA: Diagnosis not present

## 2020-09-06 DIAGNOSIS — J449 Chronic obstructive pulmonary disease, unspecified: Secondary | ICD-10-CM

## 2020-09-06 DIAGNOSIS — Z6830 Body mass index (BMI) 30.0-30.9, adult: Secondary | ICD-10-CM

## 2020-09-06 DIAGNOSIS — Z79899 Other long term (current) drug therapy: Secondary | ICD-10-CM

## 2020-09-06 DIAGNOSIS — E1165 Type 2 diabetes mellitus with hyperglycemia: Secondary | ICD-10-CM

## 2020-09-06 LAB — POCT URINALYSIS DIPSTICK
Bilirubin, UA: NEGATIVE
Blood, UA: NEGATIVE
Glucose, UA: NEGATIVE
Ketones, UA: NEGATIVE
Leukocytes, UA: NEGATIVE
Nitrite, UA: NEGATIVE
Protein, UA: NEGATIVE
Spec Grav, UA: 1.015 (ref 1.010–1.025)
Urobilinogen, UA: 0.2 E.U./dL
pH, UA: 7 (ref 5.0–8.0)

## 2020-09-06 LAB — POCT UA - MICROALBUMIN
Albumin/Creatinine Ratio, Urine, POC: <30
Creatinine, POC: 50 mg/dL
Microalbumin Ur, POC: 10 mg/L

## 2020-09-06 MED ORDER — BISOPROLOL FUMARATE 5 MG PO TABS
ORAL_TABLET | ORAL | 3 refills | Status: DC
Start: 2020-09-06 — End: 2020-11-27

## 2020-09-06 NOTE — Progress Notes (Signed)
This visit occurred during the SARS-CoV-2 public health emergency.  Safety protocols were in place, including screening questions prior to the visit, additional usage of staff PPE, and extensive cleaning of exam room while observing appropriate contact time as indicated for disinfecting solutions.  Subjective:   [name] is a 74 y.o. female who presents for Medicare Annual (Subsequent) preventive examination.  Review of Systems     Cardiac Risk Factors include: advanced age (>88mn, >>60women);diabetes mellitus;hypertension;obesity (BMI >30kg/m2)     Objective:    Today's Vitals   09/06/20 1032 09/06/20 1038  BP: 122/80   Pulse: 72   Temp: 98 F (36.7 C)   TempSrc: Oral   Weight: 189 lb 3.2 oz (85.8 kg)   Height: _0  (1.676 m)   PainSc:  8    Body mass index is 30.54 kg/m.  Advanced Directives 09/06/2020 09/01/2019 09/12/2017 09/10/2017 09/28/2016 09/28/2016  Does Patient Have a Medical Advance Directive? _1  No  Would patient like information on creating a medical advance directive? No - Patient declined - No - Patient declined - No - patient declined information No - patient declined information    Current Medications (verified) Outpatient Encounter Medications as of 09/06/2020  Medication Sig   Accu-Chek FastClix Lancets MISC Inject 1 each as directed daily. Use as directed to check blood sugars daily dx: e11.22   albuterol (VENTOLIN HFA) 108 (90 Base) MCG/ACT inhaler INHALE 2 PUFFS INTO THE LUNGS EVERY 4 HOURS AS NEEDED FOR WHEEZING OR SHORTNESS OF BREATH   aspirin 81 MG tablet Take 81 mg by mouth daily.    Biotin 10000 MCG TBDP Take by mouth.   bisoprolol (ZEBETA) 5 MG tablet TAKE 1/2 TABLET(2.5 MG) BY MOUTH DAILY   Budeson-Glycopyrrol-Formoterol (BREZTRI AEROSPHERE) 160-9-4.8 MCG/ACT AERO Inhale 2 puffs into the lungs 2 (two) times daily.   CALCIUM-MAGNESIUM-ZINC PO Take 1 capsule by mouth daily.   Cholecalciferol (VITAMIN D3) 2000 UNITS TABS Take  2,000 Units by mouth daily.   famotidine (PEPCID) 20 MG tablet Take 1 tablet (20 mg total) by mouth at bedtime.   glucose blood (ACCU-CHEK GUIDE) test strip 1 each by Other route daily. Use as instructed to check blood sugars 1 time per day dx: e11.22   ipratropium-albuterol (DUONEB) 0.5-2.5 (3) MG/3ML SOLN TAKE 3 MILLILITERS BY NEBULIZATION EVERY 6 HOURS AS NEEDED   lidocaine (LIDODERM) 5 % Place 3 patches onto the skin daily. Remove & Discard patch within 12 hours or as directed by MD   Magnesium 250 MG TABS Take 1 tablet by mouth daily.   montelukast (SINGULAIR) 10 MG tablet TAKE 1 TABLET(10 MG) BY MOUTH AT BEDTIME   pantoprazole (PROTONIX) 40 MG tablet Take 310m before the first meal of the day.   pregabalin (LYRICA) 75 MG capsule    rosuvastatin (CRESTOR) 20 MG tablet TAKE 1 TABLET(20 MG) BY MOUTH AT BEDTIME   Semaglutide,0.25 or 0.5MG/DOS, (OZEMPIC, 0.25 OR 0.5 MG/DOSE,) 2 MG/1.5ML SOPN Inject 0.5 mg into the skin once a week. (Patient taking differently: Inject 1 mg into the skin once a week. )   traMADol (ULTRAM) 50 MG tablet Take 1 tablet (50 mg total) by mouth every 6 (six) hours as needed.   acetaminophen (TYLENOL) 500 MG tablet Take 2 tablets by mouth every 6 (six) hours as needed. Patient taking every 6-8 hours as needed post surgery (Patient not taking: Reported on 07/19/2020)   ipratropium-albuterol (DUONEB) 0.5-2.5 (3) MG/3ML SOLN TAKE 3 MILLILITERS BY NEBULIZATION EVERY  6 HOURS AS NEEDED   SYMBICORT 80-4.5 MCG/ACT inhaler INHALE 2 PUFFS BY MOUTH TWICE DAILY   No facility-administered encounter medications on file as of 09/06/2020.    Allergies (verified) Other   History: Past Medical History:  Diagnosis Date   COPD (chronic obstructive pulmonary disease) (HCC)    Diabetes ([place])    High cholesterol    Hypertension    Pneumonia 09/10/2017   Past Surgical History:  Procedure Laterality Date   ABDOMINAL HYSTERECTOMY  1995   carpel tunnel surgery  2006    COLONOSCOPY  2014   COLOSTOMY  2007   colostomy let down  2008   endoscopy  2014   rotator cuff tear Left 04/14/2020   Family History  Problem Relation Age of Onset   Heart murmur Mother    Heart attack Father    Diabetes Father    Diabetes Brother    Social History   Socioeconomic History   Marital status: Married    Spouse name: Not on file   Number of children: Not on file   Years of education: Not on file   Highest education level: Not on file  Occupational History   Occupation: Retired    Comment: Former Advertising copywriter to Scientist, physiological of education  Tobacco Use   Smoking status: Former Smoker    Packs/day: 0.25    Years: 42.00    Pack years: 10.50    Types: Cigarettes    Quit date: 01/30/2006    Years since quitting: 14.6   Smokeless tobacco: Never Used  Vaping Use   Vaping Use: Never used  Substance and Sexual Activity   Alcohol use: No   Drug use: No   Sexual activity: Not Currently  Other Topics Concern   Not on file  Social History Narrative   Not on file   Social Determinants of Health   Financial Resource Strain: Low Risk    Difficulty of Paying Living Expenses: Not hard at all  Food Insecurity: No Food Insecurity   Worried About Charity fundraiser in the Last Year: Never true   [place] in the Last Year: Never true  Transportation Needs: No Transportation Needs   Lack of Transportation (Medical): No   Lack of Transportation (Non-Medical): No  Physical Activity: Insufficiently Active   Days of Exercise per Week: 5 days   Minutes of Exercise per Session: 10 min  Stress: No Stress Concern Present   Feeling of Stress : Not at all  Social Connections:    Frequency of Communication with Friends and Family: Not on file   Frequency of Social Gatherings with Friends and Family: Not on file   Attends Religious Services: Not on Electrical engineer or Organizations: Not on file   Attends Theatre manager Meetings: Not on file   Marital Status: Not on file    Tobacco Counseling Counseling given: Not Answered   Clinical Intake:  Pre-visit preparation completed: Yes  Pain : 0-10 Pain Score: 8  Pain Type: Chronic pain Pain Location: Arm Pain Orientation: Left Pain Onset: More than a month ago Pain Frequency: Constant     Nutritional Status: BMI > 30  Obese Nutritional Risks: None Diabetes: Yes  How often do you need to have someone help you when you read instructions, pamphlets, or other written materials from your doctor or pharmacy?: 1 - Never What is the last grade level you completed in school?: Associates degree  Diabetic? Yes  Nutrition Risk Assessment:  Has the patient had any N/V/D within the last 2 months?  No  Does the patient have any non-healing wounds?  No  Has the patient had any unintentional weight loss or weight gain?  No   Diabetes:  Is the patient diabetic?  Yes  If diabetic, was a CBG obtained today?  No  Did the patient bring in their glucometer from home?  No  How often do you monitor your CBG's? Does not check.   Financial Strains and Diabetes Management:  Are you having any financial strains with the device, your supplies or your medication? No .  Does the patient want to be seen by Chronic Care Management for management of their diabetes?  No  Would the patient like to be referred to a Nutritionist or for Diabetic Management?  No   Diabetic Exams:  Diabetic Eye Exam: Completed 10/19/2019 Diabetic Foot Exam: Completed today   Interpreter Needed?: No  Information entered by :: NAllen LPN   Activities of Daily Living In your present state of health, do you have any difficulty performing the following activities: 09/06/2020  Hearing? N  Vision? N  Difficulty concentrating or making decisions? N  Walking or climbing stairs? N  Dressing or bathing? N  Doing errands, shopping? N  Preparing Food and eating ? N  Using the  Toilet? N  In the past six months, have you accidently leaked urine? Y  Do you have problems with loss of bowel control? N  Managing your Medications? N  Managing your Finances? N  Housekeeping or managing your Housekeeping? N  Some recent data might be hidden    Patient Care Team: [name], MD as PCP - General (Internal Medicine) Rex Kras, Claudette Stapler, RN as Case Manager [place], Kennieth Francois, [facility] as Pharmacist (Pharmacist)  Indicate any recent Medical Services you may have received from other than [facility] providers in the past year (date may be approximate).     Assessment:   This is a routine wellness examination for Delicia.  Hearing/Vision screen  Hearing Screening   _0  _1  _2  _3  _4  _5  _6  _7  _8   Right ear:           Left ear:           Vision Screening Comments: Regular eye exams, Dr. Katy Fitch  Dietary issues and exercise activities discussed: Current Exercise Habits: Home exercise routine, Type of exercise: walking, Time (Minutes): 15, Frequency (Times/Week): 5, Weekly Exercise (Minutes/Week): 75  Goals      COPD Management      CARE PLAN ENTRY  Current Barriers:   Chronic Disease Management support, education, and care coordination needs related to COPD  Pharmacist Clinical Goal(s):   Over the next 90 days patient will continue to focus on medication adherence  Interventions:  Comprehensive medication review performed.  Patient Self Care Activities:   Over the next 90 days patient will continue to use medications daily as directed  Patient will utilize rescue inhaler or nebulizer solution as needed for exacerbations  Initial goal documentation       Diabetes: Goal A1c <7%      CARE PLAN ENTRY  Current Barriers:   Diabetes: controlled; complicated by chronic medical conditions including hypertension and high cholesterol Lab Results  Component Value Date   HGBA1C 6.2 (H) 05/04/2020    Lab Results  Component Value Date    CREATININE 0.76 05/04/2020   CREATININE 0.90 01/04/2020   CREATININE 0.99 09/01/2019   Kidney Function  Lab Results   Component  Value  Date/Time     CREATININE  0.76  05/04/2020 11:38 AM     CREATININE  0.90  01/04/2020 10:57 AM     GFRNONAA  79  05/04/2020 11:38 AM     GFRAA  91  05/04/2020 11:38 AM    Current antihyperglycemic regimen: Ozempic 63m weekly  Denies hypoglycemic symptoms, including dizziness, lightheadedness, shaking, sweating  Denies hyperglycemic symptoms, including polyuria, polydipsia, polyphagia, nocturia, blurred vision, neuropathy  Exercise prior to should surgery: Walking program 15 minutes, 4-5 days per week  Current blood glucose readings: 90s  Cardiovascular risk reduction: o Current hypertensive regimen: Bisoprolol 517m 1/2 tablet daily o Current hyperlipidemia regimen: Rosuvastatin 2017maily o Current antiplatelet regimen: Aspirin 59m83mily  Pharmacist Clinical Goal(s):   Over the next 90 days, patient will work with PharmD and primary care provider to maintain A1c level  Interventions:  Comprehensive medication review performed, medication list updated in electronic medical record  Provided dietary and exercise recommendations  [place] regarding status of shipment of Ozempic 1mg 65mtient Self Care Activities:   Patient will check blood glucose if symptomatic over the next 90 days, document, and provide at future appointments   Patient will take medications as prescribed daily over the next 90 days  Patient will contact provider with any episodes of hypoglycemia  Patient will report any questions or concerns to provider   Patient will try to exercise for 30 minutes 5 times weekly or a total of 150 minutes weekly  Please see past updates related to this goal by clicking on the "Past Updates" button in the selected goal        High Blood Pressure: Goal <130/80mmH22m   CARE  PLAN ENTRY  Current Barriers:   Current antihypertensive regimen: Bisoprolol 5mg, 125mtablet by mouth daily  Previous antihypertensives tried: Lisinopril, losartan, Bystolic  Last practice recorded BP readings:  BP Readings from Last 3 Encounters:  08/21/20 118/70  07/19/20 140/78  06/14/20 132/84    Current home BP readings: Does not check at home  Most recent eGFR/CrCl:   Component  Value  Date/Time     [facility]1/04/[facility] 1057   [place]1/04/2020 1057     Pharmacist Clinical Goal(s):   Over the next 90 days, patient will work with PharmD and providers to optimize antihypertensive regimen  Interventions:  Comprehensive medication review performed; medication list updated in the electronic medical record.   Provided dietary and exercise recommendations  Discussed the possibility of obtaining home blood pressure monitor through insurance for a small fee  Patient Self Care Activities:   Patient will resume exercise as able post-surgery over the next 90 days  Patient will continue to check BP if symptomatic, document, and provide at future appointments  Patient will focus on medication adherence by continuing to use pill box over the next 90 days  Please see past updates related to this goal by clicking on the "Past Updates" button in the selected goal        High Cholesterol Management: Goal LDL <70      CARE PLAN ENTRY  Current Barriers:   Hyperlipidemia, complicated by diabetes  Current antihyperlipidemic regimen: Rosuvastatin 20mg da42m No previous antihyperlipidemic medications noted  Most recent lipid panel:     Component Value Date/Time   CHOL 146 05/04/2020 1138   TRIG 119 05/04/2020 1138   HDL  47 05/04/2020 1138   CHOLHDL 3.1 05/04/2020 1138   LDLCALC 78 05/04/2020 1138    ASCVD risk enhancing conditions: age >9, DM, HTN, CKD, CHF, current smoker  10-year ASCVD risk score: 26.4%  Pharmacist Clinical Goal(s):   Over  the next 90 days, patient will work with PharmD and providers towards optimized antihyperlipidemic therapy  Interventions:  Comprehensive medication review performed; medication list updated in electronic medical record.   Provided dietary and exercise recommendations o Discussed the importance of exercise to meet cholesterol goal  Discussed appropriate goals for LDL (less than 70)  Patient Self Care Activities:   Patient will focus on medication adherence by continuing to use pill box daily   Patient will continue to focus on heart healthy diet over the next 90 days  Patient will try to exercise for 30 minutes daily 5 times per week or a total of 150 minutes weekly  Please see past updates related to this goal by clicking on the "Past Updates" button in the selected goal         I would like to optimize my medication management of my chronic conditions (pt-stated)      Current Barriers:   Diabetes: T2DM; most recent A1c 6.6% on 01/04/20   Current antihyperglycemic regimen: Ozempic 74m weekly with patient assistance program. o Patient reports tolerating higher dose of Ozempic 140mweekly o Will reapply for patient assistance to maintain optimal therapeutic outcomes  denies hypoglycemic symptoms, denies hyperglycemic symptoms  Current meal patterns: o Breakfast/Lunch/Supper: encouraged patient to have protein, vegetable, low carb side o Snacks: increased sweets (had honey bun yesterday evening) o Drinks:mostly water, counseled patient to avoid sugary beverages, etc  Current exercise: n/a  Current blood glucose readings: Recent A1c was 6.6%.  Denies hypo/hyper SX.  Patient was previously monitoring blood glucose readings weekly before she injects. Encouraged patient to continue checking once weekly (or if symptomatic).  Cardiovascular risk reduction: o Current hypertensive regimen: bisoprolol, HCTZ o Current hyperlipidemia regimen: rosuvastatin 2079mHS    COPD:    -controlled on Symbicort Twice daily  Reports 100% compliance -rescue PRN albuterol inhaler -discussed PAP for Symbicort-->will apply for patient assistance -vaccines up to date  Pharmacist Clinical Goal(s):   Over the next 90 days, patient with work with PharmD and primary care provider to address needs related to optimized medication management of chronic conditions  Interventions: Call completed with patient on 02/07/20  Comprehensive medication review performed, medication list updated in electronic medical record  Reviewed & discussed the following diabetes-related information with patient: o Follow ADA recommended "diabetes-friendly" diet  (reviewed healthy snack/food options) o Discussed GLP-1 injection technique; Patient uses AccuCheck glucometer o Reviewed medication purpose/side effects-->patient denies adverse events  Patient Self Care Activities:   Patient will check blood glucose once weekly, document, and provide at future appointments  Patient will focus on medication adherence by continued compliance with medications as prescribed.  Patient will take medications as prescribed  Patient will contact provider with any episodes of hypoglycemia  Patient will report any questions or concerns to provider   Please see past updates related to this goal by clicking on the "Past Updates" button in the selected goal        Patient Stated      09/06/2020, no goals      Weight (lb) < 200 lb (90.7 kg)      09/01/2019, would like to lose 30 pounds      Depression Screen PHQ 2/9 Scores 09/06/2020 09/01/2019  07/01/2019 06/07/2019 02/03/2019 11/09/2018 10/06/2018  PHQ - 2 Score 0 3 0 0 0 0 0  PHQ- 9 Score - 3 - - - - -    Fall Risk Fall Risk  09/06/2020 01/04/2020 09/01/2019 07/01/2019 06/07/2019  Falls in the past year? 0 0 0 0 0  Comment - - - - -  Number falls in past yr: - - - - -  Comment - - - - -  Injury with Fall? - - - - -  Risk for fall due to : Medication side effect -  Medication side effect - -  Follow up Falls evaluation completed;Education provided;Falls prevention discussed - Falls evaluation completed;Falls prevention discussed - -    Any stairs in or around the home? Yes  If so, are there any without handrails? Yes  Home free of loose throw rugs in walkways, pet beds, electrical cords, etc? Yes  Adequate lighting in your home to reduce risk of falls? Yes   ASSISTIVE DEVICES UTILIZED TO PREVENT FALLS:  Life alert? No  Use of a cane, walker or w/c? No  Grab bars in the bathroom? Yes  Shower chair or bench in shower? No  Elevated toilet seat or a handicapped toilet? No   TIMED UP AND GO:  Was the test performed? No .    Gait steady and fast without use of assistive device  Cognitive Function:     6CIT Screen 09/06/2020 09/01/2019  What Year? 0 points 0 points  What month? 0 points 0 points  What time? 0 points 0 points  Count back from 20 0 points 0 points  Months in reverse 0 points 0 points  Repeat phrase 0 points 0 points  Total Score 0 0    Immunizations Immunization History  Administered Date(s) Administered   Influenza Split 09/29/2013, 09/30/2015   Influenza Whole 08/30/2016   Influenza, High Dose Seasonal PF 09/29/2017, 09/14/2018, 09/01/2019   Influenza,inj,Quad PF,6+ Mos 09/12/2014   Influenza-Unspecified 09/14/2018   PFIZER SARS-COV-2 Vaccination 01/19/2020, 02/09/2020   Pneumococcal Conjugate-13 01/06/2020   Pneumococcal Polysaccharide-23 10/07/2013   Pneumococcal-Unspecified 10/05/2013   Tdap 06/07/2019, 06/09/2019    TDAP status: Up to date Flu Vaccine status: Completed at today's visit Pneumococcal vaccine status: Up to date Covid-19 vaccine status: Completed vaccines  Qualifies for Shingles Vaccine? Yes   Zostavax completed No   Shingrix Completed?: Yes  Screening Tests Health Maintenance  Topic Date Due   INFLUENZA VACCINE  07/30/2020   FOOT EXAM  08/31/2020   URINE MICROALBUMIN   08/31/2020   OPHTHALMOLOGY EXAM  10/18/2020   HEMOGLOBIN A1C  11/04/2020   MAMMOGRAM  02/07/2021   COLONOSCOPY  04/30/2023   TETANUS/TDAP  06/08/2029   DEXA SCAN  Completed   COVID-19 Vaccine  Completed   Hepatitis C Screening  Completed   PNA vac Low Risk Adult  Completed    Health Maintenance  Health Maintenance Due  Topic Date Due   INFLUENZA VACCINE  07/30/2020   FOOT EXAM  08/31/2020   URINE MICROALBUMIN  08/31/2020    Colorectal cancer screening: Completed 04/29/2013. Repeat every 10 years Mammogram status: Completed 02/08/2020. Repeat every year Bone Density status: Completed 03/02/2015.   Lung Cancer Screening: (Low Dose CT Chest recommended if Age 63-80 years, 30 pack-year currently smoking OR have quit w/in 15years.) does not qualify.   Lung Cancer Screening Referral: no  Additional Screening:  Hepatitis C Screening: does qualify; Completed 05/19/2013  Vision Screening: Recommended annual ophthalmology exams for early detection of  glaucoma and other disorders of the eye. Is the patient up to date with their annual eye exam?  Yes  Who is the provider or what is the name of the office in which the patient attends annual eye exams? Dr. Katy Fitch If pt is not established with a provider, would they like to be referred to a provider to establish care? No .   Dental Screening: Recommended annual dental exams for proper oral hygiene  Community Resource Referral / Chronic Care Management: CRR required this visit?  No   CCM required this visit?  No      Plan:     I have personally reviewed and noted the following in the patients chart:    Medical and social history  Use of alcohol, tobacco or illicit drugs   Current medications and supplements  Functional ability and status  Nutritional status  Physical activity  Advanced directives  List of other physicians  Hospitalizations, surgeries, and ER visits in previous 12  months  Vitals  Screenings to include cognitive, depression, and falls  Referrals and appointments  In addition, I have reviewed and discussed with patient certain preventive protocols, quality metrics, and best practice recommendations. A written personalized care plan for preventive services as well as general preventive health recommendations were provided to patient.     Kellie Simmering, LPN   08/03/5363   Nurse Notes:

## 2020-09-06 NOTE — Addendum Note (Signed)
Addended by: Kellie Simmering on: 09/06/2020 04:31 PM   Modules accepted: Orders

## 2020-09-06 NOTE — Patient Instructions (Signed)
Health Maintenance After Age 73 After age 73, you are at a higher risk for certain long-term diseases and infections as well as injuries from falls. Falls are a major cause of broken bones and head injuries in people who are older than age 73. Getting regular preventive care can help to keep you healthy and well. Preventive care includes getting regular testing and making lifestyle changes as recommended by your health care provider. Talk with your health care provider about:  Which screenings and tests you should have. A screening is a test that checks for a disease when you have no symptoms.  A diet and exercise plan that is right for you. What should I know about screenings and tests to prevent falls? Screening and testing are the best ways to find a health problem early. Early diagnosis and treatment give you the best chance of managing medical conditions that are common after age 73. Certain conditions and lifestyle choices may make you more likely to have a fall. Your health care provider may recommend:  Regular vision checks. Poor vision and conditions such as cataracts can make you more likely to have a fall. If you wear glasses, make sure to get your prescription updated if your vision changes.  Medicine review. Work with your health care provider to regularly review all of the medicines you are taking, including over-the-counter medicines. Ask your health care provider about any side effects that may make you more likely to have a fall. Tell your health care provider if any medicines that you take make you feel dizzy or sleepy.  Osteoporosis screening. Osteoporosis is a condition that causes the bones to get weaker. This can make the bones weak and cause them to break more easily.  Blood pressure screening. Blood pressure changes and medicines to control blood pressure can make you feel dizzy.  Strength and balance checks. Your health care provider may recommend certain tests to check your  strength and balance while standing, walking, or changing positions.  Foot health exam. Foot pain and numbness, as well as not wearing proper footwear, can make you more likely to have a fall.  Depression screening. You may be more likely to have a fall if you have a fear of falling, feel emotionally low, or feel unable to do activities that you used to do.  Alcohol use screening. Using too much alcohol can affect your balance and may make you more likely to have a fall. What actions can I take to lower my risk of falls? General instructions  Talk with your health care provider about your risks for falling. Tell your health care provider if: ? You fall. Be sure to tell your health care provider about all falls, even ones that seem minor. ? You feel dizzy, sleepy, or off-balance.  Take over-the-counter and prescription medicines only as told by your health care provider. These include any supplements.  Eat a healthy diet and maintain a healthy weight. A healthy diet includes low-fat dairy products, low-fat (lean) meats, and fiber from whole grains, beans, and lots of fruits and vegetables. Home safety  Remove any tripping hazards, such as rugs, cords, and clutter.  Install safety equipment such as grab bars in bathrooms and safety rails on stairs.  Keep rooms and walkways well-lit. Activity   Follow a regular exercise program to stay fit. This will help you maintain your balance. Ask your health care provider what types of exercise are appropriate for you.  If you need a cane or   walker, use it as recommended by your health care provider.  Wear supportive shoes that have nonskid soles. Lifestyle  Do not drink alcohol if your health care provider tells you not to drink.  If you drink alcohol, limit how much you have: ? 0-1 drink a day for women. ? 0-2 drinks a day for men.  Be aware of how much alcohol is in your drink. In the [place] one drink equals one typical bottle of beer (12  oz), one-half glass of wine (5 oz), or one shot of hard liquor (1 oz).  Do not use any products that contain nicotine or tobacco, such as cigarettes and e-cigarettes. If you need help quitting, ask your health care provider. Summary  Having a healthy lifestyle and getting preventive care can help to protect your health and wellness after age 73.  Screening and testing are the best way to find a health problem early and help you avoid having a fall. Early diagnosis and treatment give you the best chance for managing medical conditions that are more common for people who are older than age 73.  Falls are a major cause of broken bones and head injuries in people who are older than age 73. Take precautions to prevent a fall at home.  Work with your health care provider to learn what changes you can make to improve your health and wellness and to prevent falls. This information is not intended to replace advice given to you by your health care provider. Make sure you discuss any questions you have with your health care provider. Document Revised: 04/08/2019 Document Reviewed: 10/29/2017 Elsevier Patient Education  2020 [employer]  

## 2020-09-06 NOTE — Progress Notes (Addendum)
I,[name],acting as a Education administrator for Tammy Greenland, MD.,have documented all relevant documentation on the behalf of Tammy Greenland, MD,as directed by  Tammy Greenland, MD while in the presence of Tammy Greenland, MD.  This visit occurred during the SARS-CoV-2 public health emergency.  Safety protocols were in place, including screening questions prior to the visit, additional usage of staff PPE, and extensive cleaning of exam room while observing appropriate contact time as indicated for disinfecting solutions.  Subjective:     Patient ID: [name] , female    DOB: [birth year] , 74 y.o.   MRN: [id number]   Chief Complaint  Patient presents with  . Annual Exam  . Diabetes  . Hypertension    HPI  The patient is here today for a physical examination; however, her insurance no longer covers this. She is no longer followed by GYN. She is s/p hysterectomy. She reports that [name] has been added to her COPD regimen, she is no longer taking Symbicort.   Diabetes She presents for her follow-up diabetic visit. She has type 2 diabetes mellitus. Her disease course has been stable. There are no hypoglycemic associated symptoms. Pertinent negatives for diabetes include no blurred vision and no chest pain. There are no hypoglycemic complications. Risk factors for coronary artery disease include diabetes mellitus, dyslipidemia, hypertension, obesity, sedentary lifestyle and post-menopausal. She has not had a previous visit with a dietitian. She participates in exercise three times a week. Her breakfast blood glucose is taken between 8-9 am. Her breakfast blood glucose range is generally 110-130 mg/dl. Eye exam is current.  Hypertension This is a chronic problem. The current episode started more than 1 year ago. The problem has been gradually improving since onset. The problem is controlled. Pertinent negatives include no blurred vision, chest pain, palpitations or shortness of breath. The  current treatment provides moderate improvement. Compliance problems include exercise.      Past Medical History:  Diagnosis Date  . COPD (chronic obstructive pulmonary disease) ([place])   . Diabetes ([place])   . High cholesterol   . Hypertension   . Pneumonia 09/10/2017     Family History  Problem Relation Age of Onset  . Heart murmur Mother   . Heart attack Father   . Diabetes Father   . Diabetes Brother      Current Outpatient Medications:  .  Accu-Chek FastClix Lancets MISC, Inject 1 each as directed daily. Use as directed to check blood sugars daily dx: e11.22, Disp: 50 each, Rfl: 11 .  albuterol (VENTOLIN HFA) 108 (90 Base) MCG/ACT inhaler, INHALE 2 PUFFS INTO THE LUNGS EVERY 4 HOURS AS NEEDED FOR WHEEZING OR SHORTNESS OF BREATH, Disp: 6.7 g, Rfl: 1 .  aspirin 81 MG tablet, Take 81 mg by mouth daily. , Disp: , Rfl:  .  Biotin 10000 MCG TBDP, Take by mouth., Disp: , Rfl:  .  bisoprolol (ZEBETA) 5 MG tablet, TAKE 1/2 TABLET(2.5 MG) BY MOUTH DAILY, Disp: 45 tablet, Rfl: 3 .  Budeson-Glycopyrrol-Formoterol (BREZTRI AEROSPHERE) 160-9-4.8 MCG/ACT AERO, Inhale 2 puffs into the lungs 2 (two) times daily., Disp: 10.7 g, Rfl: 0 .  CALCIUM-MAGNESIUM-ZINC PO, Take 1 capsule by mouth daily., Disp: , Rfl:  .  Cholecalciferol (VITAMIN D3) 2000 UNITS TABS, Take 2,000 Units by mouth daily., Disp: , Rfl:  .  famotidine (PEPCID) 20 MG tablet, Take 1 tablet (20 mg total) by mouth at bedtime., Disp: 90 tablet, Rfl: 3 .  fluticasone (FLONASE) 50 MCG/ACT nasal spray,  Place 1 spray into both nostrils daily as needed for allergies or rhinitis., Disp: 16 g, Rfl: 2 .  gabapentin (NEURONTIN) 300 MG capsule, TAKE 1 CAPSULE(300 MG) BY MOUTH TWICE DAILY, Disp: 90 capsule, Rfl: 3 .  glucose blood (ACCU-CHEK GUIDE) test strip, 1 each by Other route daily. Use as instructed to check blood sugars 1 time per day dx: e11.22, Disp: 50 each, Rfl: 11 .  ipratropium-albuterol (DUONEB) 0.5-2.5 (3) MG/3ML SOLN, TAKE 3  MILLILITERS BY NEBULIZATION EVERY 6 HOURS AS NEEDED, Disp: 180 mL, Rfl: 2 .  lidocaine (LIDODERM) 5 %, Place 3 patches onto the skin daily. Remove & Discard patch within 12 hours or as directed by MD, Disp: 90 patch, Rfl: 1 .  Magnesium 250 MG TABS, Take 1 tablet by mouth daily., Disp: , Rfl:  .  montelukast (SINGULAIR) 10 MG tablet, TAKE 1 TABLET(10 MG) BY MOUTH AT BEDTIME, Disp: 30 tablet, Rfl: 11 .  pantoprazole (PROTONIX) 40 MG tablet, Take 40mn before the first meal of the day., Disp: 90 tablet, Rfl: 2 .  predniSONE (DELTASONE) 10 MG tablet, Take  4 each am x 2 days,   2 each am x 2 days,  1 each am x 2 days and stop, Disp: 14 tablet, Rfl: 0 .  predniSONE (DELTASONE) 10 MG tablet, 4 tabs for 2 days, then 3 tabs for 2 days, 2 tabs for 2 days, then 1 tab for 2 days, then stop, Disp: 20 tablet, Rfl: 0 .  rosuvastatin (CRESTOR) 20 MG tablet, TAKE 1 TABLET(20 MG) BY MOUTH AT BEDTIME, Disp: 90 tablet, Rfl: 1 .  Semaglutide,0.25 or 0.5MG/DOS, (OZEMPIC, 0.25 OR 0.5 MG/DOSE,) 2 MG/1.5ML SOPN, Inject 0.5 mg into the skin once a week. (Patient taking differently: Inject 1 mg into the skin once a week. ), Disp: 3 pen, Rfl: 1 .  SYMBICORT 80-4.5 MCG/ACT inhaler, Inhale 2 puffs into the lungs 2 (two) times daily., Disp: , Rfl:  .  traMADol (ULTRAM) 50 MG tablet, Take 1 tablet (50 mg total) by mouth every 6 (six) hours as needed., Disp: 30 tablet, Rfl: 0   Allergies  Allergen Reactions  . Other Itching    HAIR DYE      The patient states she uses status post hysterectomy for birth control. Last LMP was No LMP recorded. Patient has had a hysterectomy.. Negative for Dysmenorrhea. Negative for: breast discharge, breast lump(s), breast pain and breast self exam. Associated symptoms include abnormal vaginal bleeding. Pertinent negatives include abnormal bleeding (hematology), anxiety, decreased libido, depression, difficulty falling sleep, dyspareunia, history of infertility, nocturia, sexual dysfunction, sleep  disturbances, urinary incontinence, urinary urgency, vaginal discharge and vaginal itching. Diet regular.The patient states her exercise level is  moderate - 15 minutes five days per week.  . The patient's tobacco use is:  Social History   Tobacco Use  Smoking Status Former Smoker  . Packs/day: 0.25  . Years: 42.00  . Pack years: 10.50  . Types: Cigarettes  . Quit date: 01/30/2006  . Years since quitting: 14.7  Smokeless Tobacco Never Used  . She has been exposed to passive smoke. The patient's alcohol use is:  Social History   Substance and Sexual Activity  Alcohol Use No   Review of Systems  Constitutional: Negative.   HENT: Negative.   Eyes: Negative.  Negative for blurred vision.  Respiratory: Negative.  Negative for shortness of breath.   Cardiovascular: Negative.  Negative for chest pain and palpitations.  Gastrointestinal: Negative.   Endocrine: Negative.  Genitourinary: Negative.   Musculoskeletal: Negative.   Skin: Negative.   Allergic/Immunologic: Negative.   Neurological: Negative.   Hematological: Negative.   Psychiatric/Behavioral: Negative.      Today's Vitals   09/06/20 1057  BP: 122/80  Pulse: 72  Temp: 98 F (36.7 C)  TempSrc: Oral  Weight: 189 lb 3.2 oz (85.8 kg)  Height: '5\' 6"'  (1.676 m)  PainSc: 8    Body mass index is 30.54 kg/m.  . Wt Readings from Last 3 Encounters:  10/16/20 190 lb 12.8 oz (86.5 kg)  09/06/20 189 lb 3.2 oz (85.8 kg)  09/06/20 189 lb 3.2 oz (85.8 kg)   Objective:  Physical Exam Constitutional:      General: She is not in acute distress.    Appearance: Normal appearance. She is well-developed.  HENT:     Head: Normocephalic and atraumatic.     Right Ear: Hearing, tympanic membrane, ear canal and external ear normal. There is no impacted cerumen.     Left Ear: Hearing, tympanic membrane, ear canal and external ear normal. There is no impacted cerumen.     Nose:     Comments: Deferred, masked    Mouth/Throat:      Comments: Deferred, masked Eyes:     General: Lids are normal.     Extraocular Movements: Extraocular movements intact.     Conjunctiva/sclera: Conjunctivae normal.     Pupils: Pupils are equal, round, and reactive to light.     Funduscopic exam:    Right eye: No papilledema.        Left eye: No papilledema.  Neck:     Thyroid: No thyroid mass.     Vascular: No carotid bruit.  Cardiovascular:     Rate and Rhythm: Normal rate and regular rhythm.     Pulses: Normal pulses.          Dorsalis pedis pulses are 2+ on the right side and 2+ on the left side.     Heart sounds: Normal heart sounds. No murmur heard.   Pulmonary:     Effort: Pulmonary effort is normal.     Breath sounds: Wheezing present.     Comments: Decreased breath sounds b/l Chest:     Breasts: Tanner Score is 5.     Comments: Breast exam not performed, she kept bra on.  Abdominal:     General: Abdomen is flat. Bowel sounds are normal. There is no distension.     Palpations: Abdomen is soft.     Tenderness: There is no abdominal tenderness.  Musculoskeletal:        General: No swelling. Normal range of motion.     Cervical back: Full passive range of motion without pain, normal range of motion and neck supple.     Right lower leg: No edema.     Left lower leg: No edema.  Feet:     Right foot:     Protective Sensation: 5 sites tested. 5 sites sensed.     Skin integrity: Dry skin present.     Toenail Condition: Right toenails are normal.     Left foot:     Protective Sensation: 5 sites tested. 5 sites sensed.     Skin integrity: Dry skin present.     Toenail Condition: Left toenails are normal.  Skin:    General: Skin is warm and dry.     Capillary Refill: Capillary refill takes less than 2 seconds.  Neurological:     General: No focal  deficit present.     Mental Status: She is alert and oriented to person, place, and time.     Cranial Nerves: No cranial nerve deficit.     Sensory: No sensory deficit.   Psychiatric:        Mood and Affect: Mood normal.        Behavior: Behavior normal.        Thought Content: Thought content normal.        Judgment: Judgment normal.         Assessment And Plan:      1. Diabetes mellitus with stage 2 chronic kidney disease ([place]) Comments: Diabetic foot exam was performed. I DISCUSSED WITH THE PATIENT AT LENGTH REGARDING THE GOALS OF GLYCEMIC CONTROL AND POSSIBLE LONG-TERM COMPLICATIONS.  I  ALSO STRESSED THE IMPORTANCE OF COMPLIANCE WITH HOME GLUCOSE MONITORING, DIETARY RESTRICTIONS INCLUDING AVOIDANCE OF SUGARY DRINKS/PROCESSED FOODS,  ALONG WITH REGULAR EXERCISE.  I  ALSO STRESSED THE IMPORTANCE OF ANNUAL EYE EXAMS, SELF FOOT CARE AND COMPLIANCE WITH OFFICE VISITS.  - Hemoglobin A1c - CBC - BMP8+EGFR  2. Hypertensive nephropathy Comments: Chronic, well controlled. She will continue with current meds. She is encouraged to avoid adding salt to her foods. EKG performed, NSR w/ RAE. She will rto in six months for re-evaluation. PATIENT IS ADVISED TO GET 30-45 MINUTES REGULAR EXERCISE NO LESS THAN FOUR TO FIVE DAYS PER WEEK - BOTH WEIGHTBEARING EXERCISES AND AEROBIC ARE RECOMMENDED.  PATIENT IS ADVISED TO FOLLOW A HEALTHY DIET WITH AT LEAST SIX FRUITS/VEGGIES PER DAY, DECREASE INTAKE OF RED MEAT, AND TO INCREASE FISH INTAKE TO TWO DAYS PER WEEK.  MEATS/FISH SHOULD NOT BE FRIED, BAKED OR BROILED IS PREFERABLE.  I SUGGEST WEARING SPF 50 SUNSCREEN ON EXPOSED PARTS AND ESPECIALLY WHEN IN THE DIRECT SUNLIGHT FOR AN EXTENDED PERIOD OF TIME.  PLEASE AVOID FAST FOOD RESTAURANTS AND INCREASE YOUR WATER INTAKE.  - EKG 12-Lead  3. High cholesterol Comments: Chronic,most recent lipid results reviewed, May 2021. Encouraged to avoid fried foods and to follow a heart healthy lifestyle.   4. Vitamin D deficiency disease Comments: I will check vitamin D level and supplement as needed.  - VITAMIN D 25 Hydroxy (Vit-D Deficiency, Fractures)  5. 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 She was given samples of Breztri to continue as per Pulmonary.   6. Atherosclerosis of aorta ([facility]) Chronic, encouraged to comply with statin therapy. Advised to follow a heart healthy lifestyle  7. Class 1 obesity due to excess calories with serious comorbidity and body mass index (BMI) of 30.0 to 30.9 in adult Comments: Her BMI is acceptable for her demographic. Encouraged to aim for at least 150 minutes of exercise per week.   8. Drug therapy Comments: I will recheck vitamin B12 levels. She has been on long-term PPI therapy.  - Vitamin B12     Patient was given opportunity to ask questions. Patient verbalized understanding of the plan and was able to repeat key elements of the plan. All questions were answered to their satisfaction.   Tammy Greenland, MD   I, Tammy Greenland, MD, have reviewed all documentation for this visit. The documentation on 10/22/20 for the exam, diagnosis, procedures, and orders are all accurate and complete.  THE PATIENT IS ENCOURAGED TO PRACTICE SOCIAL DISTANCING DUE TO THE COVID-19 PANDEMIC.

## 2020-09-06 NOTE — Patient Instructions (Signed)
Ms. [name] , Thank you for taking time to come for your Medicare Wellness Visit. I appreciate your ongoing commitment to your health goals. Please review the following plan we discussed and let me know if I can assist you in the future.   Screening recommendations/referrals: Colonoscopy: completed 04/29/2013 Mammogram: completed 02/08/2020 Bone Density: completed 03/02/2015 Recommended yearly ophthalmology/optometry visit for glaucoma screening and checkup Recommended yearly dental visit for hygiene and checkup  Vaccinations: Influenza vaccine: today Pneumococcal vaccine: completed 01/06/2020 Tdap vaccine: completed 06/09/2019 Shingles vaccine:  completed   Covid-19: 02/09/2020, 01/18/2019  Advanced directives: Advance directive discussed with you today. Even though you declined this today please call our office should you change your mind and we can give you the proper paperwork for you to fill out.  Conditions/risks identified: none  Next appointment: Follow up in one year for your annual wellness visit    Preventive Care 65 Years and Older, Female Preventive care refers to lifestyle choices and visits with your health care provider that can promote health and wellness. What does preventive care include?  A yearly physical exam. This is also called an annual well check.  Dental exams once or twice a year.  Routine eye exams. Ask your health care provider how often you should have your eyes checked.  Personal lifestyle choices, including:  Daily care of your teeth and gums.  Regular physical activity.  Eating a healthy diet.  Avoiding tobacco and drug use.  Limiting alcohol use.  Practicing safe sex.  Taking low-dose aspirin every day.  Taking vitamin and mineral supplements as recommended by your health care provider. What happens during an annual well check? The services and screenings done by your health care provider during your annual well check will depend on your  age, overall health, lifestyle risk factors, and family history of disease. Counseling  Your health care provider may ask you questions about your:  Alcohol use.  Tobacco use.  Drug use.  Emotional well-being.  Home and relationship well-being.  Sexual activity.  Eating habits.  History of falls.  Memory and ability to understand (cognition).  Work and work Statistician.  Reproductive health. Screening  You may have the following tests or measurements:  Height, weight, and BMI.  Blood pressure.  Lipid and cholesterol levels. These may be checked every 5 years, or more frequently if you are over 66 years old.  Skin check.  Lung cancer screening. You may have this screening every year starting at age 70 if you have a 30-pack-year history of smoking and currently smoke or have quit within the past 15 years.  Fecal occult blood test (FOBT) of the stool. You may have this test every year starting at age 43.  Flexible sigmoidoscopy or colonoscopy. You may have a sigmoidoscopy every 5 years or a colonoscopy every 10 years starting at age 60.  Hepatitis C blood test.  Hepatitis B blood test.  Sexually transmitted disease (STD) testing.  Diabetes screening. This is done by checking your blood sugar (glucose) after you have not eaten for a while (fasting). You may have this done every 1-3 years.  Bone density scan. This is done to screen for osteoporosis. You may have this done starting at age 47.  Mammogram. This may be done every 1-2 years. Talk to your health care provider about how often you should have regular mammograms. Talk with your health care provider about your test results, treatment options, and if necessary, the need for more tests. Vaccines  Your  health care provider may recommend certain vaccines, such as:  Influenza vaccine. This is recommended every year.  Tetanus, diphtheria, and acellular pertussis (Tdap, Td) vaccine. You may need a Td booster  every 10 years.  Zoster vaccine. You may need this after age 35.  Pneumococcal 13-valent conjugate (PCV13) vaccine. One dose is recommended after age 10.  Pneumococcal polysaccharide (PPSV23) vaccine. One dose is recommended after age 60. Talk to your health care provider about which screenings and vaccines you need and how often you need them. This information is not intended to replace advice given to you by your health care provider. Make sure you discuss any questions you have with your health care provider. Document Released: 01/12/2016 Document Revised: 09/04/2016 Document Reviewed: 10/17/2015 Elsevier Interactive Patient Education  2017 [employer] Prevention in the Home Falls can cause injuries. They can happen to people of all ages. There are many things you can do to make your home safe and to help prevent falls. What can I do on the outside of my home?  Regularly fix the edges of walkways and driveways and fix any cracks.  Remove anything that might make you trip as you walk through a door, such as a raised step or threshold.  Trim any bushes or trees on the path to your home.  Use bright outdoor lighting.  Clear any walking paths of anything that might make someone trip, such as rocks or tools.  Regularly check to see if handrails are loose or broken. Make sure that both sides of any steps have handrails.  Any raised decks and porches should have guardrails on the edges.  Have any leaves, snow, or ice cleared regularly.  Use sand or salt on walking paths during winter.  Clean up any spills in your garage right away. This includes oil or grease spills. What can I do in the bathroom?  Use night lights.  Install grab bars by the toilet and in the tub and shower. Do not use towel bars as grab bars.  Use non-skid mats or decals in the tub or shower.  If you need to sit down in the shower, use a plastic, non-slip stool.  Keep the floor dry. Clean up any  water that spills on the floor as soon as it happens.  Remove soap buildup in the tub or shower regularly.  Attach bath mats securely with double-sided non-slip rug tape.  Do not have throw rugs and other things on the floor that can make you trip. What can I do in the bedroom?  Use night lights.  Make sure that you have a light by your bed that is easy to reach.  Do not use any sheets or blankets that are too big for your bed. They should not hang down onto the floor.  Have a firm chair that has side arms. You can use this for support while you get dressed.  Do not have throw rugs and other things on the floor that can make you trip. What can I do in the kitchen?  Clean up any spills right away.  Avoid walking on wet floors.  Keep items that you use a lot in easy-to-reach places.  If you need to reach something above you, use a strong step stool that has a grab bar.  Keep electrical cords out of the way.  Do not use floor polish or wax that makes floors slippery. If you must use wax, use non-skid floor wax.  Do not  have throw rugs and other things on the floor that can make you trip. What can I do with my stairs?  Do not leave any items on the stairs.  Make sure that there are handrails on both sides of the stairs and use them. Fix handrails that are broken or loose. Make sure that handrails are as long as the stairways.  Check any carpeting to make sure that it is firmly attached to the stairs. Fix any carpet that is loose or worn.  Avoid having throw rugs at the top or bottom of the stairs. If you do have throw rugs, attach them to the floor with carpet tape.  Make sure that you have a light switch at the top of the stairs and the bottom of the stairs. If you do not have them, ask someone to add them for you. What else can I do to help prevent falls?  Wear shoes that:  Do not have high heels.  Have rubber bottoms.  Are comfortable and fit you well.  Are closed  at the toe. Do not wear sandals.  If you use a stepladder:  Make sure that it is fully opened. Do not climb a closed stepladder.  Make sure that both sides of the stepladder are locked into place.  Ask someone to hold it for you, if possible.  Clearly mark and make sure that you can see:  Any grab bars or handrails.  First and last steps.  Where the edge of each step is.  Use tools that help you move around (mobility aids) if they are needed. These include:  Canes.  Walkers.  Scooters.  Crutches.  Turn on the lights when you go into a dark area. Replace any light bulbs as soon as they burn out.  Set up your furniture so you have a clear path. Avoid moving your furniture around.  If any of your floors are uneven, fix them.  If there are any pets around you, be aware of where they are.  Review your medicines with your doctor. Some medicines can make you feel dizzy. This can increase your chance of falling. Ask your doctor what other things that you can do to help prevent falls. This information is not intended to replace advice given to you by your health care provider. Make sure you discuss any questions you have with your health care provider. Document Released: 10/12/2009 Document Revised: 05/23/2016 Document Reviewed: 01/20/2015 Elsevier Interactive Patient Education  2017 [employer].

## 2020-09-06 NOTE — Addendum Note (Signed)
Addended by: Glenna Durand E on: 09/06/2020 12:56 PM   Modules accepted: Orders

## 2020-09-06 NOTE — Chronic Care Management (AMB) (Signed)
Chronic Care Management Pharmacy Assistant   Name: [name]  MRN: [id number] DOB: 1946/05/23  Reason for Encounter: Patient Assistance Coordination  PCP : [name], MD  Allergies:   Allergies  Allergen Reactions  . Other Itching    HAIR DYE    Medications: Outpatient Encounter Medications as of 09/06/2020  Medication Sig  . Accu-Chek FastClix Lancets MISC Inject 1 each as directed daily. Use as directed to check blood sugars daily dx: e11.22  . acetaminophen (TYLENOL) 500 MG tablet Take 2 tablets by mouth every 6 (six) hours as needed. Patient taking every 6-8 hours as needed post surgery (Patient not taking: Reported on 07/19/2020)  . albuterol (VENTOLIN HFA) 108 (90 Base) MCG/ACT inhaler INHALE 2 PUFFS INTO THE LUNGS EVERY 4 HOURS AS NEEDED FOR WHEEZING OR SHORTNESS OF BREATH  . aspirin 81 MG tablet Take 81 mg by mouth daily.   . Biotin 10000 MCG TBDP Take by mouth.  . bisoprolol (ZEBETA) 5 MG tablet TAKE 1/2 TABLET(2.5 MG) BY MOUTH DAILY  . Budeson-Glycopyrrol-Formoterol (BREZTRI AEROSPHERE) 160-9-4.8 MCG/ACT AERO Inhale 2 puffs into the lungs 2 (two) times daily.  Marland Kitchen CALCIUM-MAGNESIUM-ZINC PO Take 1 capsule by mouth daily.  . Cholecalciferol (VITAMIN D3) 2000 UNITS TABS Take 2,000 Units by mouth daily.  . famotidine (PEPCID) 20 MG tablet Take 1 tablet (20 mg total) by mouth at bedtime.  Marland Kitchen glucose blood (ACCU-CHEK GUIDE) test strip 1 each by Other route daily. Use as instructed to check blood sugars 1 time per day dx: e11.22  . ipratropium-albuterol (DUONEB) 0.5-2.5 (3) MG/3ML SOLN TAKE 3 MILLILITERS BY NEBULIZATION EVERY 6 HOURS AS NEEDED  . ipratropium-albuterol (DUONEB) 0.5-2.5 (3) MG/3ML SOLN TAKE 3 MILLILITERS BY NEBULIZATION EVERY 6 HOURS AS NEEDED  . lidocaine (LIDODERM) 5 % Place 3 patches onto the skin daily. Remove & Discard patch within 12 hours or as directed by MD  . Magnesium 250 MG TABS Take 1 tablet by mouth daily.  . montelukast (SINGULAIR) 10 MG  tablet TAKE 1 TABLET(10 MG) BY MOUTH AT BEDTIME  . pantoprazole (PROTONIX) 40 MG tablet Take 52min before the first meal of the day.  . pregabalin (LYRICA) 75 MG capsule   . rosuvastatin (CRESTOR) 20 MG tablet TAKE 1 TABLET(20 MG) BY MOUTH AT BEDTIME  . Semaglutide,0.25 or 0.5MG /DOS, (OZEMPIC, 0.25 OR 0.5 MG/DOSE,) 2 MG/1.5ML SOPN Inject 0.5 mg into the skin once a week. (Patient taking differently: Inject 1 mg into the skin once a week. )  . SYMBICORT 80-4.5 MCG/ACT inhaler INHALE 2 PUFFS BY MOUTH TWICE DAILY  . traMADol (ULTRAM) 50 MG tablet Take 1 tablet (50 mg total) by mouth every 6 (six) hours as needed.   No facility-administered encounter medications on file as of 09/06/2020.    Current Diagnosis: Patient Active Problem List   Diagnosis Date Noted  . Former smoker 08/21/2020  . High cholesterol   . Type II diabetes mellitus, uncontrolled ([place]) 11/09/2018  . Diarrhea   . Community acquired pneumonia of left lower lobe of lung   . Rhinovirus infection 10/02/2016  . Acute asthma exacerbation 10/02/2016  . COPD with acute exacerbation ([place]) 09/28/2016  . Hypoxia 09/28/2016  . SOB (shortness of breath) 09/28/2016  . COPD exacerbation ([place]) 09/28/2016  . Essential hypertension 11/03/2014  . Cough 10/19/2014  . COPD GOLD II  03/21/2014  . Rhinitis, nonallergic 03/21/2014    Follow-Up:  Patient Assistance Coordination- Patient is getting Symbicort 80/4.5 mcg from patient assistance with [employer]. Medication  was recently changed to [facility]. Called [employer] and spoke with Chrisandra Carota, she states they would need a new prescription faxed over with documentation on face sheet "Medication Change", sent to [phone]. Also was informed to follow up in 3 hours from fax to see if received and updated. Jannette Fogo, CPP is aware.  09/08/2020- Printing and faxing prescription to [facility], will follow up in 3 hours to check if updated. Patient was able to get samples from the office until assistance  arrives. Attempted follow up but wait time too long.  09/27/20-Following up on if patient was able to receive medication from [facility] or needs samples. Called patient, no answer, left message to return call. Called [facility] to inquire, on hold for a few minutes, but while on hold I reviewed Dr Wert's(Pulmonology)  office note from 09/14/2020, Judithann Sauger was changed back to Symbicort 80 mg due to cough from [place]. Jannette Fogo, CPP notified.  Pattricia Boss, [place] Pharmacist Assistant [phone]

## 2020-09-07 LAB — BMP8+EGFR
BUN/Creatinine Ratio: 7 — ABNORMAL LOW (ref 12–28)
BUN: 5 mg/dL — ABNORMAL LOW (ref 8–27)
CO2: 28 mmol/L (ref 20–29)
Calcium: 9.6 mg/dL (ref 8.7–10.3)
Chloride: 99 mmol/L (ref 96–106)
Creatinine, Ser: 0.76 mg/dL (ref 0.57–1.00)
GFR calc Af Amer: 91 mL/min/{1.73_m2} (ref >59)
GFR calc non Af Amer: 79 mL/min/{1.73_m2} (ref >59)
Glucose: 72 mg/dL (ref 65–99)
Potassium: 4.4 mmol/L (ref 3.5–5.2)
Sodium: 141 mmol/L (ref 134–144)

## 2020-09-07 LAB — CBC
Hematocrit: 45 % (ref 34.0–46.6)
Hemoglobin: 14.2 g/dL (ref 11.1–15.9)
MCH: 29 pg (ref 26.6–33.0)
MCHC: 31.6 g/dL (ref 31.5–35.7)
MCV: 92 fL (ref 79–97)
Platelets: 373 10*3/uL (ref 150–450)
RBC: 4.9 x10E6/uL (ref 3.77–5.28)
RDW: 13.7 % (ref 11.7–15.4)
WBC: 6.2 10*3/uL (ref 3.4–10.8)

## 2020-09-07 LAB — VITAMIN B12: Vitamin B-12: 357 pg/mL (ref 232–1245)

## 2020-09-07 LAB — HEMOGLOBIN A1C
Est. average glucose Bld gHb Est-mCnc: 134 mg/dL
Hgb A1c MFr Bld: 6.3 % — ABNORMAL HIGH (ref 4.8–5.6)

## 2020-09-07 LAB — VITAMIN D 25 HYDROXY (VIT D DEFICIENCY, FRACTURES): Vit D, 25-Hydroxy: 33.1 ng/mL (ref 30.0–100.0)

## 2020-09-11 DIAGNOSIS — M5412 Radiculopathy, cervical region: Secondary | ICD-10-CM | POA: Diagnosis not present

## 2020-09-12 ENCOUNTER — Encounter: Payer: Self-pay | Admitting: Internal Medicine

## 2020-09-13 DIAGNOSIS — M5412 Radiculopathy, cervical region: Secondary | ICD-10-CM | POA: Diagnosis not present

## 2020-09-13 NOTE — Telephone Encounter (Signed)
Will need at least a  televisit to prescribe - ok to schedule end of any day this week but let her know I can't guarantee a specific time as I will work her in

## 2020-09-13 NOTE — Telephone Encounter (Signed)
Dr. [name] please advise on patient mychart message   Good morning. I am requesting a script for Tramadol. If you look at my file, you will see from time to time that I suffer from a cough with a tickle in my throat. It has been going on for 2 weeks now and it is very debilitating. As you will see from my files that the only thing that took away the cough was oxycodone. But I have discovered that Tramadol works great if I take 2 (50mg ) each. I know that this is not a cure but it stops the cough until the season of episodes are over. If you need to see me again in order to prescribe this, please give me appointment ASAP as this tickle makes me gag, heave, throw up and drool AND this has already happened twice in public. As you can imagine, it was horrifying. I pray you are able to call in script today. Thank you.

## 2020-09-13 NOTE — Telephone Encounter (Signed)
Called and spoke with patient she is now scheduled for Televisit tomorrow with Dr. [name]. Nothing further needed at this time.

## 2020-09-14 ENCOUNTER — Encounter: Payer: Self-pay | Admitting: Internal Medicine

## 2020-09-14 ENCOUNTER — Ambulatory Visit (INDEPENDENT_AMBULATORY_CARE_PROVIDER_SITE_OTHER): Payer: Medicare Other | Admitting: Internal Medicine

## 2020-09-14 ENCOUNTER — Other Ambulatory Visit: Payer: Self-pay

## 2020-09-14 DIAGNOSIS — R519 Headache, unspecified: Secondary | ICD-10-CM

## 2020-09-14 DIAGNOSIS — J449 Chronic obstructive pulmonary disease, unspecified: Secondary | ICD-10-CM | POA: Diagnosis not present

## 2020-09-14 DIAGNOSIS — R05 Cough: Secondary | ICD-10-CM

## 2020-09-14 DIAGNOSIS — R059 Cough, unspecified: Secondary | ICD-10-CM

## 2020-09-14 MED ORDER — SYMBICORT 80-4.5 MCG/ACT IN AERO
2.0000 | INHALATION_SPRAY | Freq: Two times a day (BID) | RESPIRATORY_TRACT | Status: DC
Start: 2020-09-14 — End: 2020-11-21

## 2020-09-14 MED ORDER — PREDNISONE 10 MG PO TABS: ORAL_TABLET | ORAL | 0 refills | Status: DC

## 2020-09-14 MED ORDER — GABAPENTIN 300 MG PO CAPS: 300.0000 mg | ORAL_CAPSULE | Freq: Three times a day (TID) | ORAL | Status: DC

## 2020-09-14 MED ORDER — TRAMADOL HCL 50 MG PO TABS: 50.0000 mg | ORAL_TABLET | Freq: Four times a day (QID) | ORAL | 0 refills | Status: DC | PRN

## 2020-09-14 NOTE — Assessment & Plan Note (Signed)
Quit smoking 2007 PFT: 03/17/2014-  FEV1  1.28 (58%) ratio 66 and no better p saba, ERV 27 and DLCO 56 corrects to 77 - 11/30/2014   try dulera 100 2bid > 01/03/2015 not clear helping,  use prn > flare since mid 02/2015 cough > sob  - 04/07/2015 p extensive coaching HFA effectiveness =    90% > try dulera 100 2bid > improved 04/21/15   - 06/01/2015   try dulera 200 2bid  > did not maintain  - Spirometry 04/14/2017  FEV1 1.13 (58%)  Ratio 52 p saba w/in 4 h > add symb 80 2bid   - 08/19/2017    resumed sym 80 2bid -  03/23/2018    try symb 160 due to new noct cough/ wheeze > improved 04/20/2018  - 07/22/2018   try symb 80 2bid due to UACS - Pulmonary eval at [facility] 2/267/20 rec add spiriva smi > pt declined  - 08/21/20  NP ov > try change symbicort 80  to breztri > 09/14/2020 changed back to symb 80 due to cough  Really much more adversely affected by cough than sob so try back on symbicort 80, proceed with pfts once cough is gone

## 2020-09-14 NOTE — Patient Instructions (Addendum)
Increase gabapentin to 300 mg four times daily until better   Change back breztri to symbicort 80  Take 2 puffs first thing in am and then another 2 puffs about 12 hours later.     Prednisone 10 mg take  4 each am x 2 days,   2 each am x 2 days,  1 each am x 2 days and stop    Take delsym two tsp every 12 hours and supplement if needed with  tramadol 50 mg up to 1-2 every 4 hours to suppress the urge to cough. Swallowing water and/or using ice chips/non mint and menthol containing candies (such as lifesavers or sugarless jolly ranchers) are also effective.  You should rest your voice and avoid activities that you know make you cough.  Once you have eliminated the cough for 3 straight days try reducing the tramadol first,  then the delsym as tolerated.    Keep your previous appointment

## 2020-09-14 NOTE — Addendum Note (Signed)
Addended by: [name] B on: 09/14/2020 10:24 AM   Modules accepted: Orders

## 2020-09-14 NOTE — Progress Notes (Signed)
Subjective:   Patient ID: [name], female    DOB: 02/20/1946    MRN: [id number]    Brief patient profile:  84  yobf mother of a PA in [employer] quit smoking 2007 with chronic cough x 2011 self referred for evaluation of persistent cough in setting of technically GOLD II copd dx 02/2014 with only mild obstructive pattern - has noted since high school a tendency to daily  tickle in throat causing intermittent cough waxing and waning ever since    History of Present Illness  10/19/2014 1st [facility] office visit/ [name]   Chief Complaint  Patient presents with  . Pulmonary Consult    Former pt of Dr. Annamaria Boots. Pt c/o increased cough x 3 wks. Cough is prod with moderate, thick, light yellow sputum.  She coughs until loses urinary continence.  Not sleeping well due to cough.   remembers having tendency to throat tickle in high school  but completely resolved by graduation  then recurred in her 25s and resolved with codeine then recurred in 2011  but bad to worse x 3 weeks assoc with sensation of choking, mucus is thick light yellow.  acei stopped about a month prior to OV  Replaced by losartan  Inhalers make it worse, esp dpi  Kouffman Reflux v Neurogenic Cough Differentiator Reflux Comments  Do you awaken from a sound sleep coughing violently?                            With trouble breathing? Yes   Do you have choking episodes when you cannot  Get enough air, gasping for air ?              Yes   Do you usually cough when you lie down into  The bed, or when you just lie down to rest ?                          Yes   Do you usually cough after meals or eating?         maybe   Do you cough when (or after) you bend over?    No    GERD SCORE     Kouffman Reflux v Neurogenic Cough Differentiator Neurogenic   Do you more-or-less cough all day long? yes   Does change of temperature make you cough? no   Does laughing or chuckling cause you to cough? no   Do fumes (perfume, automobile fumes,  burned  Toast, etc.,) cause you to cough ?      Not aware   Does speaking, singing, or talking on the phone cause you to cough   ?               sometimes   Neurogenic/Airway score      rec  Max rx gerd/prn tramadol        09/15/2017  Transition of care  f/u ov/[name] re: COPD II s/p aecopd Chief Complaint  Patient presents with  . Hospitalization Follow-up    Breathing has improved some, but not back at her normal baseline. She states her cough woke her up this am and had some bloody nasal d/c.  She has been coughing up some thick, yellow sputum.  She is using   walking all over [place] fine,  Flew back to [place]  on a commercial jet and short of breath  walking  from gate [place] baggage [place] then by 2 days later in ER due to severe sob / tightness across upper back >> albuterol relived the discomfort w/in 5 min but did not have her rescue so had to call 911 (proair listed on med calendar she just got from me 08/19/17 under written action plan) - says was so short of breath at that point could not have used saba anyway. Not following action plan either re controlling cough which is also worse/ has not started meds from discharge  rec Plan A = Automatic = symbicort 80 Take 2 puffs first thing in am and then another 2 puffs about 12 hours later.  Work on Interior and spatial [employer]: Plan B = Backup Only use your albuterol as a rescue medication       03/23/2018 acute extended  ov/[name] re:   Copd  - extended ov re new noct cough /wheeze and need for saba / no med calendar Chief Complaint  Patient presents with  . Acute Visit    Breathing is overall doing well. She uses her albuterol inhaler 4 x per wk on average. She has not needed her neb.  Mid February breathing /coughing  worse / wheezing every noct needs albuterol every noct Very confused with instructions/ now has neb but did not disclose med she uses at the med calendar ov and did not bring the med calendar with her nor the med for the  nebulizer and easily frustrated re concept of med reconciliation  Daytime sob = MMRC2 = can't walk a nl pace on a flat grade s sob but does fine slow and flat  rec Plan A = Automatic = symbicort 160 Take 2 puffs first thing in am and then another 2 puffs about 12 hours later.  Plan B = Backup Only use your albuterol as a rescue medication  Plan C = Crisis - only use your albuterol nebulizer if you first try Plan B and it fails to help > ok to use the nebulizer up to every 4 hours but if start needing it regularly call for immediate appointment     04/20/2018  f/u ov/[name] re:   Copd gold II / brought med calendar but not clear using consistently / chronic cough/ hbp Chief Complaint  Patient presents with  . Follow-up    COPD , feel good no problems at this time  Dyspnea:  Dances   2- 3 songs / shopping is ok  Cough: better, mostly dry/ throat clearing / noct  Tessalon maybe once every couple days  "it says to take it 3 x daily"  Sleep: better  while maint on symbicort 160 2bid Sleeping fine on 1 pillow     Symbicort 80 or 160 should be  Take 2 puffs first thing in am and then another 2 puffs about 12 hours later- you can pick which one you prefer but if  they are the same then use the lower strength Please schedule a follow up visit in 3 months but call sooner if needed     07/22/2018  f/u ov/[name] re:   GOLD II copd / has calendar not using the action plans at bottom/ since previous ov stopped lyrica and took gabapentin 100 tid x 3 weeks then stopped (was supposed to stay on until ov) Chief Complaint  Patient presents with  . Follow-up    Cough has improved slightly but "still driving me crazy"- prod with clear sputum.  She uses her proair 2  x per wk and rarely uses neb.   Dyspnea:   MMRC2 = can't walk a nl pace on a flat grade s sob but does fine slow and flat  Cough  Tickle/ gag anytime day except doesn't wake her up goes back to childhood SABA use: twice daily on bad days but  recently less / hardly ever uses neb  Says only thing that stops her cough is oxycodone rec Take delsym two tsp every 12 hours and supplement if needed with  Oxycodone  up to 1 every 4 hours to suppress the urge to cough.  Once you have eliminated the cough for 3 straight days try reducing the oxycodone first,  then the delsym as tolerated.   For drainage / throat tickle try take CHLORPHENIRAMINE  4 mg - take one every 4 hours as needed   Reduce symbicort 80 Take 2 puffs first thing in am and then another 2 puffs about 12 hours later. > did not continue consistently "you didn't make this clear"  Gabapentin 300 mg four times a day - if not doing better we need to refer you to [facility] / Dr Joya Gaskins > did not do  See Tammy NP in 6  weeks with all your medications>  Did not do    01/07/2019  Acute extended  ov/[name] re:  Flare of wheeze off symbicort, cough never resolved / no med calendar/ poor insight into meds/ action plans  Chief Complaint  Patient presents with  . Acute Visit    pt c/o wheezing, prod cough with gray/white mucus, pnd, stuffy nose.   Dyspnea:  MMRC3 = can't walk 100 yards even at a slow pace at a flat grade s stopping due to sob   Cough: severe fits of coughing day and noc  Sleeping: poorly due to cough and wheeze SABA use: qid instead of symbicort  rec Plan A = Automatic = Symbicort 80 Take 2 puffs first thing in am and then another 2 puffs about 12 hours later.  Plan B = Backup (add this to Plan A, not in place of it)  Only use your albuterol inhaler as a rescue medication Plan C = Crisis - only use your albuterol nebulizer if you first try Plan B For drainage / throat tickle try take CHLORPHENIRAMINE  4 mg(Chlortab at [facility]) -  Take delsym two tsp every 12 hours and supplement if needed with  vicodine up to 2 every 4 hours to suppress the urge to cough. Swallowing water and/or using ice chips/non mint and menthol containing candies (such as lifesavers or sugarless  jolly ranchers) are also effective.  You should rest your voice and avoid activities that you know make you cough. Once you have eliminated the cough for 3 straight days try reducing the vicodin first,  then the delsym as tolerated.   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 We discussed treatment options including observation, continuing PPI therapy, an evaluation in the voice lab to assess her candidacy for voice therapy and respiratory retraining, or a trial of tramadol.    04/16/2019 acute  extended ov/ [name] re recurrent cough on gabapentin 300 tid / did not fill tramadol "I told Dr Joya Gaskins it wouldn't work" "only better with oxycodone"  Chief Complaint  Patient presents with  . Acute Visit    Increased cough x 3 wks- prod with white sputum. She has had some wheezing also. She is using her albuterol inhaler at least once per day and rarely uses neb.  Dyspnea:  Minimal  Cough: worse tickle/ no h1 on hand says plans to pick it up  Sleeping: fine 2 pillows / not typically waking her up  SABA use: rarely  02: none  Doesn't think delsym helped/ no h1 on hand / using gabapentin 300 mg tid   gen chest discomfort with severe coughing fits  rec Prednisone 10 mg take  4 each am x 2 days,   2 each am x 2 days,  1 each am x 2 days and stop   Take delsym two tsp every 12 hours and supplement if needed with oxidocone 5 mg up to 2 every 4 hours to suppress the urge to cough.  Once you have eliminated the cough for 3 straight days try reducing the oxycodone first,  then the delsym as tolerated.   Keep your appts with speech therapy  Please schedule a follow up visit in 3 months  but call sooner if needed  with all medications /inhalers/ solutions in hand so we can verify exactly what you are taking. This includes all medications from all doctors and over the counters     07/19/2019  Virtual f/u ov/[name] re: GOLD II COPD/uacs Dyspnea:  A lot of walking inside walking 15-20 min  Cough: none, no cough meds  Sleeping: no problem / 2 pillows  SABA use: rare 02: none  rec No change in medications    Virtual Visit via Telephone Note 12/16/2019   I connected with [name] on 12/16/19 at  8:15 PM EST by telephone and verified that I am speaking with the correct person using two identifiers.   I discussed the limitations, risks, security and privacy concerns of performing an evaluation and management service by telephone and the availability of in person appointments. I also discussed with the patient that there may be a patient responsible charge related to this service. The patient expressed understanding and agreed to proceed.    History of Present Illness: Was fine until 2 weeks on symb 80 2bid/ ppi ac/singulair /gabapentin tid,  Was not taking proair or neb  Dyspnea: was walking house fine but nothing outside/ steps slowly ok  Cough: acutely worse slimy slt yellowish with nasal  Sleeping: after a while on 3 pillows  SABA use: hfa no  Better so changed to duoneb 3-4 days  rec Prednisone 10 mg take  4 each am x 2 days,   2 each am x 2 days,  1 each am x 2 days and stop  Zpak  mucinex dm 1200 mg every 12 hours and supplement with oxycodone 5 mg 1-2 every 4 hours as needed   increase the gabapentin 300 mg 4 x daily  Stop Proair (albuterol) and just use the Nebulizer (duoneb= ipatroprium/albuterol)  up to every 4 hours  If not improving  Within a week we need to see you  all your medications / inhalers/ solutions) and consider a flutter valve   08/21/20  NP ov > try change symbicort 80  to breztri   Virtual Visit via Telephone Note 09/14/2020  I  connected with [name] on 09/14/20 at  8:30  AM EDT by telephone and verified that I am speaking with the correct person using two identifiers. Pt is at home and this call made from my office with no other participants    I discussed the limitations, risks, security and privacy concerns of performing an evaluation and management service by telephone and the availability of in person appointments. I also discussed with the patient that there may be a patient responsible charge related to this service. The patient expressed understanding and agreed to proceed.   History of Present Illness: Acute flare x 2 weeks was taking gabapentin 300 mg tid /changed to lyrica 3 week prior to OV for pain  By ortho but pain no better  Dyspnea:  Worse than usual esp in am / better during the day > not better on breztri vs symb 80  Cough: tickle /choke/heave/ vomit  Sleeping: bed is flat/ 2 pillows  SABA use: once a day  02: none    No obvious day to day or daytime variability or assoc excess/ purulent sputum or mucus plugs or hemoptysis or cp or chest tightness, subjective wheeze or overt sinus or hb symptoms.    Also denies any obvious fluctuation of symptoms with weather or environmental changes or other aggravating or alleviating factors except as outlined above.   Meds reviewed/ med reconciliation completed     No outpatient medications have been marked as taking for the 09/14/20 encounter (Office Visit) with Tanda Rockers, MD.         Observations/Objective: Good voice texture, spont dry cough/ no conversational sob    Assessment and Plan: See problem list for active a/p's   Follow Up Instructions: See avs for instructions unique to this ov which includes revised/ updated med list     I discussed the assessment and treatment plan with the patient. The patient was provided an opportunity to ask questions and all were answered. The patient agreed with the plan and demonstrated an  understanding of the instructions.   The patient was advised to call back or seek an in-person evaluation if the symptoms worsen or if the condition fails to improve as anticipated.  I provided 30  minutes of non-face-to-face time during this encounter.   [name], MD

## 2020-09-14 NOTE — Assessment & Plan Note (Signed)
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 - try off cozar 11/03/2014 >>> - neurontin 100 tid rx 11/30/14 > improved 01/03/2015 > changed to lyrica 04/2015 but not taking consistently as of 01/30/2016  - Sinus CT 02/17/15 > neg  - FENO  04/14/17 =  14  - rechallenged with gabapentin 300 qid 07/22/2018 and if not better > refer to Dr Joya Gaskins at [facility] > did not go, referred again 01/07/2019 > see eval 03/02/2019 rec speech therapy/tramadol (pt refused latter)   - flare early Sep 2021 off gabapentin and on breztri > changed back to gabapentin / symb 80 2bid    Of the three most common causes of  Sub-acute / recurrent or chronic cough, only one (GERD)  can actually contribute to/ trigger  the other two (asthma and post nasal drip syndrome)  and perpetuate the cylce of cough.  While not intuitively obvious, many patients with chronic low grade reflux do not cough until there is a primary insult that disturbs the protective epithelial barrier and exposes sensitive nerve endings.   This is typically viral but can due to PNDS and  either may apply here.   The point is that once this occurs, it is difficult to eliminate the cycle  using anything but a maximally effective acid suppression regimen at least in the short run, accompanied by an appropriate diet to address non acid GERD and control / eliminate the cough itself for at least 3 days with tramadol and Also added 6 days of Prednisone in case of component of Th-2 driven upper or lower airways inflammation (if cough responds short term only to relapse befor return while will on rx for uacs that would point to allergic rhinitis/ asthma or eos bronchitis)     Each maintenance medication was reviewed in detail including most importantly the difference between maintenance and as needed and under what circumstances the prns are to be used.  Please see AVS for  specific  Instructions which are unique to this visit and I personally typed out  which were reviewed in detail over the phone with the patient and a copy provided via MyChart   >>> keep f/u with NP for medication reconciliation

## 2020-09-17 ENCOUNTER — Other Ambulatory Visit: Payer: Self-pay | Admitting: Internal Medicine

## 2020-09-18 DIAGNOSIS — M5412 Radiculopathy, cervical region: Secondary | ICD-10-CM | POA: Diagnosis not present

## 2020-09-20 DIAGNOSIS — M5412 Radiculopathy, cervical region: Secondary | ICD-10-CM | POA: Diagnosis not present

## 2020-10-03 DIAGNOSIS — M5412 Radiculopathy, cervical region: Secondary | ICD-10-CM | POA: Diagnosis not present

## 2020-10-05 ENCOUNTER — Other Ambulatory Visit: Payer: Self-pay | Admitting: Internal Medicine

## 2020-10-05 DIAGNOSIS — M5412 Radiculopathy, cervical region: Secondary | ICD-10-CM | POA: Diagnosis not present

## 2020-10-08 ENCOUNTER — Other Ambulatory Visit: Payer: Self-pay | Admitting: Internal Medicine

## 2020-10-10 NOTE — Telephone Encounter (Addendum)
Pt is requesting refill on GABAPENTIN 300MG  CAPSULES next ov 10/25/20. Pt is aware has already pick up from pharmacy

## 2020-10-13 NOTE — Telephone Encounter (Signed)
Going to route to [place] NP since recently seen by him with similar sx going on .

## 2020-10-13 NOTE — Telephone Encounter (Signed)
Spoke with the pt  She states having increased wheezing and SOB for the past 2 wks  She is having to use her albuterol inhaler about 4 x per day and neb at least once per day  Still on symbicort, singulair, gabapentin  She states that she is waking up in the am's early feeling SOB  She has the same cough that she has had since last ov- coughs and nothing comes up but she says the mucus goes into her nose and then feels congested  She denies f/c/s, body aches  Has had her covid vaccines  Please advise, thanks!

## 2020-10-13 NOTE — Telephone Encounter (Signed)
10/13/20  Patient was just seen by Dr. [name] on 09/14/2020.  She was prescribed a prednisone taper at that point in time.  Today her symptoms have improved?  Based off the provided information I would recommend that she have an in person evaluation in our clinic with APP or Dr. [name].  Wyn Quaker, FNP

## 2020-10-16 ENCOUNTER — Other Ambulatory Visit: Payer: Self-pay

## 2020-10-16 ENCOUNTER — Ambulatory Visit (INDEPENDENT_AMBULATORY_CARE_PROVIDER_SITE_OTHER): Payer: Medicare Other | Admitting: Pulmonary Disease

## 2020-10-16 ENCOUNTER — Encounter: Payer: Self-pay | Admitting: Pulmonary Disease

## 2020-10-16 VITALS — BP 118/70 | HR 80 | Temp 97.3°F | Ht 66.0 in | Wt 190.8 lb

## 2020-10-16 DIAGNOSIS — J310 Chronic rhinitis: Secondary | ICD-10-CM | POA: Diagnosis not present

## 2020-10-16 DIAGNOSIS — J449 Chronic obstructive pulmonary disease, unspecified: Secondary | ICD-10-CM

## 2020-10-16 DIAGNOSIS — R0602 Shortness of breath: Secondary | ICD-10-CM

## 2020-10-16 LAB — CBC WITH DIFFERENTIAL/PLATELET
Basophils Absolute: 0.1 10*3/uL (ref 0.0–0.1)
Basophils Relative: 1.5 % (ref 0.0–3.0)
Eosinophils Absolute: 0.1 10*3/uL (ref 0.0–0.7)
Eosinophils Relative: 1.2 % (ref 0.0–5.0)
HCT: 41.2 % (ref 36.0–46.0)
Hemoglobin: 13.8 g/dL (ref 12.0–15.0)
Lymphocytes Relative: 38.9 % (ref 12.0–46.0)
Lymphs Abs: 2.3 10*3/uL (ref 0.7–4.0)
MCHC: 33.5 g/dL (ref 30.0–36.0)
MCV: 89.7 fl (ref 78.0–100.0)
Monocytes Absolute: 0.5 10*3/uL (ref 0.1–1.0)
Monocytes Relative: 8.8 % (ref 3.0–12.0)
Neutro Abs: 3 10*3/uL (ref 1.4–7.7)
Neutrophils Relative %: 49.6 % (ref 43.0–77.0)
Platelets: 399 10*3/uL (ref 150.0–400.0)
RBC: 4.59 Mil/uL (ref 3.87–5.11)
RDW: 13.9 % (ref 11.5–15.5)
WBC: 6 10*3/uL (ref 4.0–10.5)

## 2020-10-16 MED ORDER — PREDNISONE 10 MG PO TABS: ORAL_TABLET | ORAL | 0 refills | Status: DC

## 2020-10-16 MED ORDER — BREZTRI AEROSPHERE 160-9-4.8 MCG/ACT IN AERO: 2.0000 | INHALATION_SPRAY | Freq: Two times a day (BID) | RESPIRATORY_TRACT | 0 refills | Status: DC

## 2020-10-16 MED ORDER — FLUTICASONE PROPIONATE 50 MCG/ACT NA SUSP: 1.0000 | Freq: Every day | NASAL | 2 refills | Status: DC | PRN

## 2020-10-16 NOTE — Assessment & Plan Note (Signed)
Suspect this likely worsened due to suspected COPD exacerbation Multiple recent prednisone tapers  Plan: Lab work today Prednisone taper today Resume Librarian, academic

## 2020-10-16 NOTE — Assessment & Plan Note (Addendum)
Patient unsure if cough was driven by inhaler choice She feels that her breathing has worsened since transitioning from [facility]  Expiratory wheeze on exam today Reviewed 2015 and 2017 pulmonary function testing with patient  Plan: Lab work today Trial of Breztri again >>> If cough recurs would recommend resuming Symbicort 80 and adding Spiriva Respimat 2.5 as previously recommended by [facility] pulmonary in February/2020 Hold Symbicort 80 May need to consider repeat pulmonary function testing Prednisone taper today Close follow-up with Dr. [name] in office to decide on maintenance inhaler choice

## 2020-10-16 NOTE — Patient Instructions (Signed)
You were seen today by Lauraine Rinne, NP  for:   1. COPD mixed type ([place])  - CBC with Differential/Platelet; Future - IgE; Future  Prednisone 10mg  tablet  >>>4 tabs for 2 days, then 3 tabs for 2 days, 2 tabs for 2 days, then 1 tab for 2 days, then stop >>>take with food  >>>take in the morning   Restart Breztri >>> 2 puffs in the morning right when you wake up, rinse out your mouth after use, 12 hours later 2 puffs, rinse after use >>> Take this daily, no matter what >>> This is not a rescue inhaler   Hold Symbicort 80  Note your daily symptoms > remember "red flags" for COPD:   >>>Increase in cough >>>increase in sputum production >>>increase in shortness of breath or activity  intolerance.   If you notice these symptoms, please call the office to be seen.   2. SOB (shortness of breath)  We will treat you with a short course of steroids  We will step up your inhaler from Symbicort 80 to breztri   If you continue to have ongoing shortness of breath we need to consider repeating pulmonary function testing  We will have you be evaluated by Dr. [name] in person and over the next 4 weeks to decide on which inhaler to be maintained on.  3. Rhinitis, nonallergic  Continue daily Singulair  Please start taking a daily antihistamine:  >>>choose one of: zyrtec, claritin, allegra, or xyzal  >>>these are over the counter medications  >>>can choose generic option  >>>take daily  >>>this medication helps with allergies, post nasal drip, and cough   Start fluticasone/Flonase nasal spray 1 spray each nostril daily as needed for allergic rhinitis/nasal congestion  Consider starting nasal saline rinses twice daily Use distilled water Shake well Get bottle lukewarm like a baby bottle Use flonase or nasal meds AFTER nasal saline rinses    We recommend today:  Orders Placed This Encounter  Procedures  . CBC with Differential/Platelet    Standing Status:   Future    Standing  Expiration Date:   10/16/2021  . IgE    Standing Status:   Future    Standing Expiration Date:   10/16/2021   Orders Placed This Encounter  Procedures  . CBC with Differential/Platelet  . IgE   Meds ordered this encounter  Medications  . predniSONE (DELTASONE) 10 MG tablet    Sig: 4 tabs for 2 days, then 3 tabs for 2 days, 2 tabs for 2 days, then 1 tab for 2 days, then stop    Dispense:  20 tablet    Refill:  0    Follow Up:    Return in about 4 weeks (around 11/13/2020), or if symptoms worsen or fail to improve, for Follow up with Dr. [name].   Notification of test results are managed in the following manner: If there are  any recommendations or changes to the  plan of care discussed in office today,  we will contact you and let you know what they are. If you do not hear from [place], then your results are normal and you can view them through your  MyChart account , or a letter will be sent to you. Thank you again for trusting [place] with your care  - Thank you, [facility]    It is flu season:   >>> Best ways to protect herself from the flu: Receive the yearly flu vaccine, practice good hand hygiene washing  with soap and also using hand sanitizer when available, eat a nutritious meals, get adequate rest, hydrate appropriately       Please contact the office if your symptoms worsen or you have concerns that you are not improving.   Thank you for choosing [facility] for your healthcare, and for allowing [place] to partner with you on your healthcare journey. I am thankful to be able to provide care to you today.   Wyn Quaker FNP-C

## 2020-10-16 NOTE — Progress Notes (Signed)
@Patient  ID: [name], female    DOB: Apr 16, 1946, 74 y.o.   MRN: [id number]  Chief Complaint  Patient presents with  . Follow-up    Patient is having shortness of breath with exertion and wheezing more in the morning when she wakes up that she has had for a couple weeks but not geting better. Dry cough    Referring provider: [name], MD  HPI:  74 year old female former smoker followed in our office for COPD  PMH: Hypertension, type 2 diabetes Smoker/ Smoking History: Former smoker Maintenance:  Symbicort 5 Pt of: Dr. [name]  10/16/2020  - Visit   74 year old female former smoker followed in our office by Dr. [name] for COPD.  Patient was last seen by Dr. [name] in September/2021 where she was treated with a prednisone taper.  Patient contacted our office on 10/12/2020 reporting that she has had increased shortness of breath for last 2 weeks.  She is continuing to use her albuterol inhaler 4 times a day.  She was requested to have a follow-up with our office given the fact that she was just recently treated with a prednisone taper in September/2021 with Dr. [name].  Patient reports adherence to her medications.  She reports at last office visit with Dr. [name] he stopped the [facility] and place her on Symbicort 80.She felt that the [facility] was helpful.  Her symptoms started to worsen after this transition.  She reports increased shortness of breath over the last 2 to 3 weeks with occasional dry cough.  Increased nasal congestion.  Wheezing.  She has not had any recent steroids.  Patient is hoping to feel better by the end of this week as she plans to go on a cruise.  2017 pulmonary function testing shows severe obstruction.  Questionaires / Pulmonary Flowsheets:   ACT:  No flowsheet data found.  MMRC: mMRC Dyspnea Scale mMRC Score  10/16/2020 2  08/21/2020 1    Epworth:  No flowsheet data found.  Tests:   04/14/2017-spirometry-FVC 2.2 (87% predicted), ratio 52, FEV1  1.1 (58% predicted) Moderately severe obstruction  10/02/2016-pulmonary function test-FVC 1.32 (50% predicted), ratio 53, FEV1 0.7 (34% predicted), DLCO 6.18 (22% predicted)  09/12/2017-chest x-ray-stable pleural-parenchymal scarring at left lung base, no acute cardiopulmonary disease  FENO:  Lab Results  Component Value Date   NITRICOXIDE 14 04/14/2017    PFT: PFT Results Latest Ref Rng & Units 10/02/2016 03/17/2014  FVC-Pre L 1.32 1.88  FVC-Predicted Pre % 50 69  FVC-Post L - 1.93  FVC-Predicted Post % - 71  Pre FEV1/FVC % % 53 66  Post FEV1/FCV % % - 63  FEV1-Pre L 0.70 1.23  FEV1-Predicted Pre % 34 58  FEV1-Post L - 1.23  DLCO uncorrected ml/min/mmHg 6.18 15.24  DLCO UNC% % 22 56  DLCO corrected ml/min/mmHg 6.22 -  DLCO COR %Predicted % 23 -  DLVA Predicted % 50 77  TLC L - 4.57  TLC % Predicted % - 85  RV % Predicted % - 119    WALK:  No flowsheet data found.  Imaging: No results found.  Lab Results:  CBC    Component Value Date/Time   WBC 6.2 09/06/2020 1425   WBC 15.1 (H) 09/13/2017 0220   RBC 4.90 09/06/2020 1425   RBC 4.20 09/13/2017 0220   HGB 14.2 09/06/2020 1425   HCT 45.0 09/06/2020 1425   PLT 373 09/06/2020 1425   MCV 92 09/06/2020 1425   MCH 29.0 09/06/2020 1425  MCH 29.0 09/13/2017 0220   MCHC 31.6 09/06/2020 1425   MCHC 33.2 09/13/2017 0220   RDW 13.7 09/06/2020 1425   LYMPHSABS 2.3 09/10/2017 0636   MONOABS 0.7 09/10/2017 0636   EOSABS 0.1 09/10/2017 0636   BASOSABS 0.0 09/10/2017 0636    BMET    Component Value Date/Time   NA 141 09/06/2020 1429   K 4.4 09/06/2020 1429   CL 99 09/06/2020 1429   CO2 28 09/06/2020 1429   GLUCOSE 72 09/06/2020 1429   GLUCOSE 135 (H) 09/12/2017 0416   BUN 5 (L) 09/06/2020 1429   CREATININE 0.76 09/06/2020 1429   CALCIUM 9.6 09/06/2020 1429   GFRNONAA 79 09/06/2020 1429   GFRAA 91 09/06/2020 1429    BNP No results found for: BNP  ProBNP    Component Value Date/Time   PROBNP 5.0  02/28/2014 1503    Specialty Problems      Pulmonary Problems   COPD GOLD II     Quit smoking 2007 PFT: 03/17/2014-  FEV1  1.28 (58%) ratio 66 and no better p saba, ERV 27 and DLCO 56 corrects to 77 - 11/30/2014   try dulera 100 2bid > 01/03/2015 not clear helping,  use prn > flare since mid 02/2015 cough > sob  - 04/07/2015 p extensive coaching HFA effectiveness =    90% > try dulera 100 2bid > improved 04/21/15   - 06/01/2015   try dulera 200 2bid  > did not maintain  - Spirometry 04/14/2017  FEV1 1.13 (58%)  Ratio 52 p saba w/in 4 h > add symb 80 2bid   - 08/19/2017    resumed sym 80 2bid -  03/23/2018    try symb 160 due to new noct cough/ wheeze > improved 04/20/2018  - 07/22/2018   try symb 80 2bid due to UACS - [facility]monary [facility] at [facility] 2/267/20 rec add spiriva smi > pt declined  - 08/21/20  NP ov > try change symbicort 80  to breztri > 09/14/2020 changed back to symb 80 due to cough          Rhinitis, nonallergic    Allergy profile 02/28/14-   total IgE 18.5 with Neg RAST        Cough    Followed in Pulmonary clinic/ [facility]/ [facility] in High school daily cough/ "throat tickle" - allergy profile 03/10/14  >  IgE 18 with neg RAST - sinus ct 10/25/2014 > Clear sinuses. - try off cozar 11/03/2014 >>> - neurontin 100 tid rx 11/30/14 > improved 01/03/2015 > changed to lyrica 04/2015 but not taking consistently as of 01/30/2016  - sinus CT 02/17/15 > neg  - FENO  04/14/17 =  14  - rechallenged with gabapentin 300 qid 07/22/2018 and if not better > refer to Dr Joya Gaskins at [facility] voice center > did not go, referred again 01/07/2019 > see eval 03/02/2019 rec speech therapy/tramadol (pt refused latter)   - flare early Sep 2021 off gabapentin and on breztri > changed back to gabapentin / symb 80 2bid       COPD exacerbation ([place])   COPD with acute exacerbation ([place])   Hypoxia   SOB (shortness of breath)   Acute asthma exacerbation   Community acquired pneumonia of left lower lobe of lung       Allergies  Allergen Reactions  . Other Itching    HAIR DYE    Immunization History  Administered Date(s) Administered  . Fluad Quad(high Dose 65+) 09/06/2020  .  Influenza Split 09/29/2013, 09/30/2015  . Influenza Whole 08/30/2016  . Influenza, High Dose Seasonal PF 09/29/2017, 09/14/2018, 09/01/2019  . Influenza,inj,Quad PF,6+ Mos 09/12/2014  . Influenza-Unspecified 09/14/2018  . PFIZER SARS-COV-2 Vaccination 01/19/2020, 02/09/2020  . Pneumococcal Conjugate-13 01/06/2020  . Pneumococcal Polysaccharide-23 10/07/2013  . Pneumococcal-Unspecified 10/05/2013  . Tdap 06/07/2019, 06/09/2019    Past Medical History:  Diagnosis Date  . COPD (chronic obstructive pulmonary disease) ([place])   . Diabetes ([place])   . High cholesterol   . Hypertension   . Pneumonia 09/10/2017    Tobacco History: Social History   Tobacco Use  Smoking Status Former Smoker  . Packs/day: 0.25  . Years: 42.00  . Pack years: 10.50  . Types: Cigarettes  . Quit date: 01/30/2006  . Years since quitting: 14.7  Smokeless Tobacco Never Used   Counseling given: Yes   Continue to not smoke  Outpatient Encounter Medications as of 10/16/2020  Medication Sig  . Accu-Chek FastClix Lancets MISC Inject 1 each as directed daily. Use as directed to check blood sugars daily dx: e11.22  . albuterol (VENTOLIN HFA) 108 (90 Base) MCG/ACT inhaler INHALE 2 PUFFS INTO THE LUNGS EVERY 4 HOURS AS NEEDED FOR WHEEZING OR SHORTNESS OF BREATH  . aspirin 81 MG tablet Take 81 mg by mouth daily.   . Biotin 10000 MCG TBDP Take by mouth.  . bisoprolol (ZEBETA) 5 MG tablet TAKE 1/2 TABLET(2.5 MG) BY MOUTH DAILY  . CALCIUM-MAGNESIUM-ZINC PO Take 1 capsule by mouth daily.  . Cholecalciferol (VITAMIN D3) 2000 UNITS TABS Take 2,000 Units by mouth daily.  . famotidine (PEPCID) 20 MG tablet Take 1 tablet (20 mg total) by mouth at bedtime.  . gabapentin (NEURONTIN) 300 MG capsule TAKE 1 CAPSULE(300 MG) BY MOUTH TWICE DAILY  . glucose blood  (ACCU-CHEK GUIDE) test strip 1 each by Other route daily. Use as instructed to check blood sugars 1 time per day dx: e11.22  . ipratropium-albuterol (DUONEB) 0.5-2.5 (3) MG/3ML SOLN TAKE 3 MILLILITERS BY NEBULIZATION EVERY 6 HOURS AS NEEDED  . lidocaine (LIDODERM) 5 % Place 3 patches onto the skin daily. Remove & Discard patch within 12 hours or as directed by MD  . Magnesium 250 MG TABS Take 1 tablet by mouth daily.  . montelukast (SINGULAIR) 10 MG tablet TAKE 1 TABLET(10 MG) BY MOUTH AT BEDTIME  . pantoprazole (PROTONIX) 40 MG tablet Take 61min before the first meal of the day.  . predniSONE (DELTASONE) 10 MG tablet Take  4 each am x 2 days,   2 each am x 2 days,  1 each am x 2 days and stop  . rosuvastatin (CRESTOR) 20 MG tablet TAKE 1 TABLET(20 MG) BY MOUTH AT BEDTIME  . Semaglutide,0.25 or 0.5MG /DOS, (OZEMPIC, 0.25 OR 0.5 MG/DOSE,) 2 MG/1.5ML SOPN Inject 0.5 mg into the skin once a week. (Patient taking differently: Inject 1 mg into the skin once a week. )  . SYMBICORT 80-4.5 MCG/ACT inhaler Inhale 2 puffs into the lungs 2 (two) times daily.  . traMADol (ULTRAM) 50 MG tablet Take 1 tablet (50 mg total) by mouth every 6 (six) hours as needed.  . Budeson-Glycopyrrol-Formoterol (BREZTRI AEROSPHERE) 160-9-4.8 MCG/ACT AERO Inhale 2 puffs into the lungs 2 (two) times daily.  . fluticasone (FLONASE) 50 MCG/ACT nasal spray Place 1 spray into both nostrils daily as needed for allergies or rhinitis.  . predniSONE (DELTASONE) 10 MG tablet 4 tabs for 2 days, then 3 tabs for 2 days, 2 tabs for 2 days, then 1 tab  for 2 days, then stop   No facility-administered encounter medications on file as of 10/16/2020.     Review of Systems  Review of Systems  Constitutional: Positive for fatigue. Negative for activity change and fever.  HENT: Positive for congestion (clear nasal drainage) and rhinorrhea. Negative for sinus pressure, sinus pain and sore throat.   Respiratory: Positive for cough (improved  slightly - dry cough ), shortness of breath and wheezing.   Cardiovascular: Negative for chest pain and palpitations.  Gastrointestinal: Negative for diarrhea, nausea and vomiting.  Musculoskeletal: Negative for arthralgias.  Neurological: Negative for dizziness.  Psychiatric/Behavioral: Negative for sleep disturbance. The patient is not nervous/anxious.      Physical Exam  BP 118/70 (BP Location: Left Arm, Patient Position: Sitting, Cuff Size: Normal)   Pulse 80   Temp (!) 97.3 F (36.3 C) (Temporal)   Ht 5\' 6"  (1.676 m)   Wt 190 lb 12.8 oz (86.5 kg)   SpO2 97%   BMI 30.80 kg/m   Wt Readings from Last 5 Encounters:  10/16/20 190 lb 12.8 oz (86.5 kg)  09/06/20 189 lb 3.2 oz (85.8 kg)  09/06/20 189 lb 3.2 oz (85.8 kg)  08/21/20 187 lb 9.6 oz (85.1 kg)  07/19/20 188 lb 6.4 oz (85.5 kg)    BMI Readings from Last 5 Encounters:  10/16/20 30.80 kg/m  09/06/20 30.54 kg/m  09/06/20 30.54 kg/m  08/21/20 30.28 kg/m  07/19/20 31.55 kg/m     Physical Exam Vitals and nursing note reviewed.  Constitutional:      General: She is not in acute distress.    Appearance: Normal appearance. She is obese.  HENT:     Head: Normocephalic and atraumatic.     Right Ear: Tympanic membrane, ear canal and external ear normal. There is impacted cerumen.     Left Ear: Tympanic membrane, ear canal and external ear normal. There is impacted cerumen.     Nose: Rhinorrhea present. No congestion.     Mouth/Throat:     Mouth: Mucous membranes are moist.     Pharynx: Oropharynx is clear.     Comments: +PND Eyes:     Pupils: Pupils are equal, round, and reactive to light.  Cardiovascular:     Rate and Rhythm: Normal rate and regular rhythm.     Pulses: Normal pulses.     Heart sounds: Normal heart sounds. No murmur heard.   Pulmonary:     Effort: Pulmonary effort is normal. No respiratory distress.     Breath sounds: No decreased air movement. Examination of the left-middle field reveals  wheezing. Examination of the left-lower field reveals wheezing. Wheezing present. No decreased breath sounds or rales.  Musculoskeletal:     Cervical back: Normal range of motion.  Skin:    General: Skin is warm and dry.     Capillary Refill: Capillary refill takes less than 2 seconds.  Neurological:     General: No focal deficit present.     Mental Status: She is alert and oriented to person, place, and time. Mental status is at baseline.     Gait: Gait normal.  Psychiatric:        Mood and Affect: Mood normal.        Behavior: Behavior normal.        Thought Content: Thought content normal.        Judgment: Judgment normal.       Assessment & Plan:   Rhinitis, nonallergic Plan: Start nasal saline rinses Start  Flonase nasal spray Start daily antihistamine Continue Singulair Lab work today  COPD GOLD II  Patient unsure if cough was driven by inhaler choice She feels that her breathing has worsened since transitioning from [place]  Expiratory wheeze on exam today Reviewed 2015 and 2017 pulmonary function testing with patient  Plan: Lab work today Trial of Librarian, academic again >>> If cough recurs would recommend resuming Symbicort 80 and adding Spiriva Respimat 2.5 as previously recommended by [facility] pulmonary in February/2020 Hold Symbicort 80 May need to consider repeat pulmonary function testing Prednisone taper today Close follow-up with Dr. [name] in office to decide on maintenance inhaler choice  SOB (shortness of breath) Suspect this likely worsened due to suspected COPD exacerbation Multiple recent prednisone tapers  Plan: Lab work today Prednisone taper today Resume Breztri     Return in about 4 weeks (around 11/13/2020), or if symptoms worsen or fail to improve, for Follow up with Dr. [name].   Lauraine Rinne, NP 10/16/2020   This appointment required 32 minutes of patient care (this includes precharting, chart review, review of results, face-to-face  care, etc.).

## 2020-10-16 NOTE — Assessment & Plan Note (Signed)
Plan: Start nasal saline rinses Start Flonase nasal spray Start daily antihistamine Continue Singulair Lab work today

## 2020-10-17 LAB — IGE: IgE (Immunoglobulin E), Serum: 49 kU/L (ref <=114)

## 2020-10-18 ENCOUNTER — Encounter: Payer: Self-pay | Admitting: *Deleted

## 2020-10-18 DIAGNOSIS — Z1152 Encounter for screening for COVID-19: Secondary | ICD-10-CM | POA: Diagnosis not present

## 2020-10-18 NOTE — Progress Notes (Signed)
Letter mailed to the pt. 

## 2020-10-19 ENCOUNTER — Other Ambulatory Visit: Payer: Self-pay | Admitting: Internal Medicine

## 2020-10-22 NOTE — Addendum Note (Signed)
Addended by: [name] on: 10/22/2020 10:44 AM   Modules accepted: Level of Service

## 2020-10-23 ENCOUNTER — Ambulatory Visit: Payer: Medicare Other | Admitting: Pulmonary Disease

## 2020-10-24 ENCOUNTER — Telehealth: Payer: Self-pay

## 2020-10-24 ENCOUNTER — Telehealth: Payer: Self-pay | Admitting: Pharmacist

## 2020-10-24 NOTE — Chronic Care Management (AMB) (Deleted)
Chronic Care Management Pharmacy  Name: [name]  MRN: [id number] DOB: [birth year]  Chief Complaint/ HPI  [name],  74 y.o. , female presents for their Follow-Up CCM visit with the clinical pharmacist via telephone due to COVID-19 Pandemic.  PCP : [name], MD  Medications: Outpatient Encounter Medications as of 10/24/2020  Medication Sig  . Accu-Chek FastClix Lancets MISC Inject 1 each as directed daily. Use as directed to check blood sugars daily dx: e11.22  . albuterol (VENTOLIN HFA) 108 (90 Base) MCG/ACT inhaler INHALE 2 PUFFS INTO THE LUNGS EVERY 4 HOURS AS NEEDED FOR WHEEZING OR SHORTNESS OF BREATH  . aspirin 81 MG tablet Take 81 mg by mouth daily.   . Biotin 10000 MCG TBDP Take by mouth.  . bisoprolol (ZEBETA) 5 MG tablet TAKE 1/2 TABLET(2.5 MG) BY MOUTH DAILY  . Budeson-Glycopyrrol-Formoterol (BREZTRI AEROSPHERE) 160-9-4.8 MCG/ACT AERO Inhale 2 puffs into the lungs 2 (two) times daily.  Marland Kitchen CALCIUM-MAGNESIUM-ZINC PO Take 1 capsule by mouth daily.  . Cholecalciferol (VITAMIN D3) 2000 UNITS TABS Take 2,000 Units by mouth daily.  . famotidine (PEPCID) 20 MG tablet Take 1 tablet (20 mg total) by mouth at bedtime.  . fluticasone (FLONASE) 50 MCG/ACT nasal spray Place 1 spray into both nostrils daily as needed for allergies or rhinitis.  Marland Kitchen gabapentin (NEURONTIN) 300 MG capsule TAKE 1 CAPSULE(300 MG) BY MOUTH TWICE DAILY  . glucose blood (ACCU-CHEK GUIDE) test strip 1 each by Other route daily. Use as instructed to check blood sugars 1 time per day dx: e11.22  . ipratropium-albuterol (DUONEB) 0.5-2.5 (3) MG/3ML SOLN TAKE 3 MILLILITERS BY NEBULIZATION EVERY 6 HOURS AS NEEDED  . lidocaine (LIDODERM) 5 % Place 3 patches onto the skin daily. Remove & Discard patch within 12 hours or as directed by MD  . Magnesium 250 MG TABS Take 1 tablet by mouth daily.  . montelukast (SINGULAIR) 10 MG tablet TAKE 1 TABLET(10 MG) BY MOUTH AT BEDTIME  . pantoprazole (PROTONIX) 40 MG  tablet Take 58min before the first meal of the day.  . predniSONE (DELTASONE) 10 MG tablet Take  4 each am x 2 days,   2 each am x 2 days,  1 each am x 2 days and stop  . predniSONE (DELTASONE) 10 MG tablet 4 tabs for 2 days, then 3 tabs for 2 days, 2 tabs for 2 days, then 1 tab for 2 days, then stop  . rosuvastatin (CRESTOR) 20 MG tablet TAKE 1 TABLET(20 MG) BY MOUTH AT BEDTIME  . Semaglutide,0.25 or 0.5MG /DOS, (OZEMPIC, 0.25 OR 0.5 MG/DOSE,) 2 MG/1.5ML SOPN Inject 0.5 mg into the skin once a week. (Patient taking differently: Inject 1 mg into the skin once a week. )  . SYMBICORT 80-4.5 MCG/ACT inhaler Inhale 2 puffs into the lungs 2 (two) times daily.  . traMADol (ULTRAM) 50 MG tablet Take 1 tablet (50 mg total) by mouth every 6 (six) hours as needed.   No facility-administered encounter medications on file as of 10/24/2020.   Current Diagnosis/Assessment:  Goals Addressed   None     COPD with chronic cough   Gold Grade: Gold 2 (FEV1 50-79%)  Eosinophil count:   Lab Results  Component Value Date/Time   EOSPCT 1.2 10/16/2020 11:01 AM                                 Eos (Absolute):  Lab Results  Component Value  Date/Time   EOSABS 0.1 10/16/2020 11:01 AM   Tobacco Status:  Social History   Tobacco Use  Smoking Status Former Smoker  . Packs/day: 0.25  . Years: 42.00  . Pack years: 10.50  . Types: Cigarettes  . Quit date: 01/30/2006  . Years since quitting: 14.7  Smokeless Tobacco Never Used   Patient has failed these meds in past: Breo, Dulera, Spiriva Patient is currently controlled on the following medications:     Ventolin 2 puffs into lungs every 4 hours as needed for wheezing/ SOB  Duoneb 1 vial every 6 hours prn  Montelukast 10mg  daily  Chronic cough currently treated with the following medications:  Famotidine 20mg  daily  Pantoprazole 40mg  daily  Gabapentin 300mg  4 times daily (patient only taking twice daily)  Using maintenance inhaler regularly?  Yes Frequency of rescue inhaler use:  infrequently; patient reports she has not had to use Ventolin or Duoneb since January  We discussed:   Importance of compliance with maintenance inhaler; patient reports 100% compliance  Pt states breathing has been better recently  Plan Continue current medications ,  Diabetes   Recent Relevant Labs: Lab Results  Component Value Date/Time   HGBA1C 6.3 (H) 09/06/2020 02:25 PM   HGBA1C 6.2 (H) 05/04/2020 11:38 AM   MICROALBUR 10 09/06/2020 12:54 PM   MICROALBUR 10 09/01/2019 02:42 PM   Kidney Function Lab Results  Component Value Date   CREATININE 0.76 09/06/2020   CREATININE 0.76 05/04/2020   CREATININE 0.90 01/04/2020      Component Value Date/Time   GFRNONAA 79 09/06/2020 1429   GFRAA 91 09/06/2020 1429    Stage 2 CKD- Stable  Checking BG: Rarely  Patient has failed these meds in past: Victoza Patient is currently controlled on the following medications:   Ozempic 1mg  weekly  Last diabetic Foot exam: 09/01/19 Last diabetic Eye exam: 10/19/19 Lab Results  Component Value Date/Time   HMDIABEYEEXA No Retinopathy 10/19/2019 12:00 AM    We discussed:  Checked BG for about a week when having facial problems and reports that it was not high (90s) Diet and exercise extensively  Does not eat breakfast (just a cup of coffee)  Does not often eat lunch  Sometimes snacks on fruit, tomato sandwich  Meat and vegetables for dinner  Advised pt to limit carbohydrates, but don't cut out completely  Pt drinks about a half gallon of water daily  Recommended amount is 64 ounces daily  Pt is doing exercise program on Youtube called "Mile a day for beginners" which was about 15 minutes of exercises usually 4-5 days per week.  Doing physical therapy twice weekly for shoulder (helping with shoulder pain)  Encouraged patient to resume exercising as soon as possible post-surgery with a goal of 150 minutes per week.   Discussed goal 30  minutes 5 times weekly  Checking on Ozempic 1mg  shipment from [employer] patient assistance program  Plan Continue current medications   Hypertension   Office blood pressures are  BP Readings from Last 3 Encounters:  10/16/20 118/70  09/06/20 122/80  09/06/20 122/80   Patient has failed these meds in the past: Lisinopril, losartan, Bystolic -ACEI/ARB contraindicated/not tolerated in this patient due to history of chronic cough  Patient checks BP at home Never, does not have a meter at home to check with  Patient is currently controlled on the following medications:   Bisoprolol 5mg  1/2 tablet daily  We discussed  Diet and exercise extensively  Recommend check BP  periodically  Pt does not have BP meter at home and does not want to purchase one right now  Advised that she can obtain through insurance, but there will be a small fee  Plan Continue current medications    High Cholesterol   Lipid Panel     Component Value Date/Time   CHOL 146 05/04/2020 1138   TRIG 119 05/04/2020 1138   HDL 47 05/04/2020 1138   CHOLHDL 3.1 05/04/2020 1138   [place] 78 05/04/2020 1138   LABVLDL 21 05/04/2020 1138    The ASCVD Risk score (Goff DC Jr., et al., 2013) failed to calculate for the following reasons:   Unable to determine if patient is Non-Hispanic African American   Patient has failed these meds in past: N/A Patient is currently uncontrolled on the following medications:   Rosuvastatin 20mg  daily  Aspirin 81mg    We discussed:   Diet and exercise extensively  Increasing exercise as recommended could help pt reach cholesterol goal  Plan Continue current medications   Pain Management Post-Surgery   Patient initially used methocarbamol 500mg  every 6 to 8 hours as needed, celecoxib 100mg  twice daily, acetaminophen 500mg  2 tablets every 6 to 8 hours as needed, and oxycodone 5mg  2 tablets every 4 hours as needed. Patient stopped oxycodone after a few days due to  ineffectiveness.  At follow up appointment with surgery on 4/27, patient was given cyclobenzaprine 10mg  every 6 hours and ketorolac 10mg  every 6 hours for 3 days. Told to hold celecoxib until ketorolac course finished.  We discussed:  No longer taking pain medications or muscle relaxers for pain post shoulder surgery (says they were not working anyway)  Would like to get epidural, but cannot position arm properly (straight out) right now. Working on this at therapy  Shoulder pain is tolerable now, but neck pain is causing issues  Physical therapy has helped with shoulder  Plan Continue Physical therapy as indicated  Health Maintenance   Patient is currently  on the following medications:   Biotin 10,022mcg daily for her nails (when she has it)  Calcium 1000mg /Mag 400mg /Zinc 15mg  w/ Vitamin D 42mcg daily  Cholecalciferol 2000 units daily  Fiber gummy (4g) twice daily  We discussed:    Patient is now taking a fiber supplement daily to help with weight management  Plan Continue current medications  Vaccines   Reviewed and discussed patient's vaccination history.    Immunization History  Administered Date(s) Administered  . Fluad Quad(high Dose 65+) 09/06/2020  . Influenza Split 09/29/2013, 09/30/2015  . Influenza Whole 08/30/2016  . Influenza, High Dose Seasonal PF 09/29/2017, 09/14/2018, 09/01/2019  . Influenza,inj,Quad PF,6+ Mos 09/12/2014  . Influenza-Unspecified 09/14/2018  . PFIZER SARS-COV-2 Vaccination 01/19/2020, 02/09/2020  . Pneumococcal Conjugate-13 01/06/2020  . Pneumococcal Polysaccharide-23 10/07/2013  . Pneumococcal-Unspecified 10/05/2013  . Tdap 06/07/2019, 06/09/2019   Patient received Prevnar at pharmacy 01/06/20  We discussed:  Pt has not received Shingrix vaccine yet, but is planning to go and get it soon  Plan Recommended patient receive Shingrix vaccine series at pharmacy.   Medication Management   Pt uses [facility] for all  medications Uses pill box? Yes Pt endorses 100% compliance with maintenance medications  We discussed the importance of medication adherence.  Plan Continue current medication management strategy  Follow up: 3 month phone visit  Jannette Fogo, PharmD Clinical Pharmacist [facility] [phone]

## 2020-10-24 NOTE — Chronic Care Management (AMB) (Signed)
  Chronic Care Management   Outreach Note  10/24/2020 Name: [name] MRN: [id number] DOB: March 01, 1946  Referred by: [name], MD Reason for referral : No chief complaint on file.   An unsuccessful telephone outreach was attempted today. The patient was referred to the pharmacist for assistance with care management and care coordination.   Called x 2 and went straight to voicemail for patient follow up visit with PharmD.  Follow Up Plan: Will reschedule for convenient time for patient.  Beverly Milch, PharmD Clinical Pharmacist [place] [phone]

## 2020-10-31 DIAGNOSIS — R030 Elevated blood-pressure reading, without diagnosis of hypertension: Secondary | ICD-10-CM | POA: Diagnosis not present

## 2020-10-31 DIAGNOSIS — M5412 Radiculopathy, cervical region: Secondary | ICD-10-CM | POA: Diagnosis not present

## 2020-10-31 DIAGNOSIS — Z6831 Body mass index (BMI) 31.0-31.9, adult: Secondary | ICD-10-CM | POA: Diagnosis not present

## 2020-11-01 ENCOUNTER — Ambulatory Visit: Payer: Medicare Other | Admitting: Pulmonary Disease

## 2020-11-02 ENCOUNTER — Encounter: Payer: Self-pay | Admitting: Internal Medicine

## 2020-11-07 ENCOUNTER — Telehealth: Payer: Self-pay | Admitting: *Deleted

## 2020-11-07 NOTE — Chronic Care Management (AMB) (Signed)
  Care Management   Note  11/07/2020 Name: [name] MRN: [id number] DOB: December 18, 1946  [name] is a 74 y.o. year old female who is a primary care patient of [name], MD and is actively engaged with the care management team. I reached out to [employer] by phone today to assist with re-scheduling a follow up visit with the Pharmacist.  Follow up plan: Unsuccessful telephone outreach attempt made. A HIPAA compliant phone message was left for the patient providing contact information and requesting a return call. The care management team will reach out to the patient again over the next 7 days. If patient returns call to provider office, please advise to call [facility] at [phone].  [facility]  Direct Dial: [phone]

## 2020-11-07 NOTE — Chronic Care Management (AMB) (Signed)
  Care Management   Note  11/07/2020 Name: [name] MRN: [id number] DOB: January 01, 1946  [name] is a 74 y.o. year old female who is a primary care patient of [name], MD and is actively engaged with the care management team. I reached out to [name] by phone today to assist with re-scheduling a follow up visit with the Pharmacist.  Follow up plan: Telephone appointment with care management team member scheduled for:11/28/2020  [facility]

## 2020-11-21 ENCOUNTER — Other Ambulatory Visit: Payer: Self-pay

## 2020-11-21 ENCOUNTER — Ambulatory Visit: Payer: Medicare Other | Admitting: Internal Medicine

## 2020-11-21 ENCOUNTER — Encounter: Payer: Self-pay | Admitting: Internal Medicine

## 2020-11-21 ENCOUNTER — Ambulatory Visit (INDEPENDENT_AMBULATORY_CARE_PROVIDER_SITE_OTHER): Payer: Medicare Other | Admitting: Internal Medicine

## 2020-11-21 DIAGNOSIS — J449 Chronic obstructive pulmonary disease, unspecified: Secondary | ICD-10-CM | POA: Diagnosis not present

## 2020-11-21 DIAGNOSIS — R059 Cough, unspecified: Secondary | ICD-10-CM

## 2020-11-21 DIAGNOSIS — R519 Headache, unspecified: Secondary | ICD-10-CM | POA: Diagnosis not present

## 2020-11-21 MED ORDER — GABAPENTIN 300 MG PO CAPS: 300.0000 mg | ORAL_CAPSULE | Freq: Four times a day (QID) | ORAL | 2 refills | Status: DC

## 2020-11-21 MED ORDER — TRAMADOL HCL 50 MG PO TABS: 50.0000 mg | ORAL_TABLET | Freq: Four times a day (QID) | ORAL | 0 refills | Status: DC | PRN

## 2020-11-21 MED ORDER — FAMOTIDINE 20 MG PO TABS
ORAL_TABLET | ORAL | 3 refills | Status: DC
Start: 2020-11-21 — End: 2022-01-23

## 2020-11-21 NOTE — Progress Notes (Signed)
Subjective:   Patient ID: [name], female    DOB: [birth year]    MRN: [id number]    Brief patient profile:  68  yobf mother of a PA @ [place]  fast med  quit smoking 2007 with chronic cough x 2011 self referred for evaluation of persistent cough in setting of technically GOLD II copd dx 02/2014 with only mild obstructive pattern - has noted since high school a tendency to daily  tickle in throat causing intermittent cough waxing and waning ever since    History of Present Illness  10/19/2014 1st [facility] office visit/ Westin Knotts   Chief Complaint  Patient presents with  . Pulmonary Consult    Former pt of Dr. Annamaria Boots. Pt c/o increased cough x 3 wks. Cough is prod with moderate, thick, light yellow sputum.  She coughs until loses urinary continence.  Not sleeping well due to cough.   remembers having tendency to throat tickle in high school  but completely resolved by graduation  then recurred in her 5s and resolved with codeine then recurred in 2011  but bad to worse x 3 weeks assoc with sensation of choking, mucus is thick light yellow.  acei stopped about a month prior to OV  Replaced by losartan  Inhalers make it worse, esp dpi  Kouffman Reflux v Neurogenic Cough Differentiator Reflux Comments  Do you awaken from a sound sleep coughing violently?                            With trouble breathing? Yes   Do you have choking episodes when you cannot  Get enough air, gasping for air ?              Yes   Do you usually cough when you lie down into  The bed, or when you just lie down to rest ?                          Yes   Do you usually cough after meals or eating?         maybe   Do you cough when (or after) you bend over?    No    GERD SCORE     Kouffman Reflux v Neurogenic Cough Differentiator Neurogenic   Do you more-or-less cough all day long? yes   Does change of temperature make you cough? no   Does laughing or chuckling cause you to cough? no   Do fumes (perfume,  automobile fumes, burned  Toast, etc.,) cause you to cough ?      Not aware   Does speaking, singing, or talking on the phone cause you to cough   ?               sometimes   Neurogenic/Airway score      rec  Max rx gerd/prn tramadol        09/15/2017  Transition of care  f/u ov/Keirstin Musil re: COPD II s/p aecopd Chief Complaint  Patient presents with  . Hospitalization Follow-up    Breathing has improved some, but not back at her normal baseline. She states her cough woke her up this am and had some bloody nasal d/c.  She has been coughing up some thick, yellow sputum.  She is using   walking all over [place] fine,  Flew back to [place]  on a commercial jet and short  of breath  walking from gate [place] then by 2 days later in ER due to severe sob / tightness across upper back >> albuterol relived the discomfort w/in 5 min but did not have her rescue so had to call 911 (proair listed on med calendar she just got from me 08/19/17 under written action plan) - says was so short of breath at that point could not have used saba anyway. Not following action plan either re controlling cough which is also worse/ has not started meds from discharge  rec Plan A = Automatic = symbicort 80 Take 2 puffs first thing in am and then another 2 puffs about 12 hours later.  Work on Interior and spatial designer: Plan B = Backup Only use your albuterol as a rescue medication       03/23/2018 acute extended  ov/Kadisha Goodine re:   Copd  - extended ov re new noct cough /wheeze and need for saba / no med calendar Chief Complaint  Patient presents with  . Acute Visit    Breathing is overall doing well. She uses her albuterol inhaler 4 x per wk on average. She has not needed her neb.  Mid February breathing /coughing  worse / wheezing every noct needs albuterol every noct Very confused with instructions/ now has neb but did not disclose med she uses at the med calendar ov and did not bring the med calendar with her  nor the med for the nebulizer and easily frustrated re concept of med reconciliation  Daytime sob = MMRC2 = can't walk a nl pace on a flat grade s sob but does fine slow and flat  rec Plan A = Automatic = symbicort 160 Take 2 puffs first thing in am and then another 2 puffs about 12 hours later.  Plan B = Backup Only use your albuterol as a rescue medication  Plan C = Crisis - only use your albuterol nebulizer if you first try Plan B and it fails to help > ok to use the nebulizer up to every 4 hours but if start needing it regularly call for immediate appointment     04/20/2018  f/u ov/Lovis More re:   Copd gold II / brought med calendar but not clear using consistently / chronic cough/ hbp Chief Complaint  Patient presents with  . Follow-up    COPD , feel good no problems at this time  Dyspnea:  Dances   2- 3 songs / shopping is ok  Cough: better, mostly dry/ throat clearing / noct  Tessalon maybe once every couple days  "it says to take it 3 x daily"  Sleep: better  while maint on symbicort 160 2bid Sleeping fine on 1 pillow     Symbicort 80 or 160 should be  Take 2 puffs first thing in am and then another 2 puffs about 12 hours later- you can pick which one you prefer but if  they are the same then use the lower strength Please schedule a follow up visit in 3 months but call sooner if needed     07/22/2018  f/u ov/Illene Sweeting re:   GOLD II copd / has calendar not using the action plans at bottom/ since previous ov stopped lyrica and took gabapentin 100 tid x 3 weeks then stopped (was supposed to stay on until ov) Chief Complaint  Patient presents with  . Follow-up    Cough has improved slightly but "still driving me crazy"- prod with clear sputum.  She  uses her proair 2 x per wk and rarely uses neb.   Dyspnea:   MMRC2 = can't walk a nl pace on a flat grade s sob but does fine slow and flat  Cough  Tickle/ gag anytime day except doesn't wake her up goes back to childhood SABA use: twice daily on  bad days but recently less / hardly ever uses neb  Says only thing that stops her cough is oxycodone rec Take delsym two tsp every 12 hours and supplement if needed with  Oxycodone  up to 1 every 4 hours to suppress the urge to cough.  Once you have eliminated the cough for 3 straight days try reducing the oxycodone first,  then the delsym as tolerated.   For drainage / throat tickle try take CHLORPHENIRAMINE  4 mg - take one every 4 hours as needed   Reduce symbicort 80 Take 2 puffs first thing in am and then another 2 puffs about 12 hours later. > did not continue consistently "you didn't make this clear"  Gabapentin 300 mg four times a day - if not doing better we need to refer you to [facility] / Dr Joya Gaskins > did not do  See Tammy NP in 6  weeks with all your medications>  Did not do    01/07/2019  Acute extended  ov/Isadora Delorey re:  Flare of wheeze off symbicort, cough never resolved / no med calendar/ poor insight into meds/ action plans  Chief Complaint  Patient presents with  . Acute Visit    pt c/o wheezing, prod cough with gray/white mucus, pnd, stuffy nose.   Dyspnea:  MMRC3 = can't walk 100 yards even at a slow pace at a flat grade s stopping due to sob   Cough: severe fits of coughing day and noc  Sleeping: poorly due to cough and wheeze SABA use: qid instead of symbicort  rec Plan A = Automatic = Symbicort 80 Take 2 puffs first thing in am and then another 2 puffs about 12 hours later.  Plan B = Backup (add this to Plan A, not in place of it)  Only use your albuterol inhaler as a rescue medication Plan C = Crisis - only use your albuterol nebulizer if you first try Plan B For drainage / throat tickle try take CHLORPHENIRAMINE  4 mg(Chlortab at [facility]) -  Take delsym two tsp every 12 hours and supplement if needed with  vicodine up to 2 every 4 hours to suppress the urge to cough. Swallowing water and/or using ice chips/non mint and menthol containing candies (such as lifesavers or  sugarless jolly ranchers) are also effective.  You should rest your voice and avoid activities that you know make you cough. Once you have eliminated the cough for 3 straight days try reducing the vicodin first,  then the delsym as tolerated.   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 We discussed treatment options including observation, continuing PPI therapy, an evaluation in the voice lab to assess her candidacy for voice therapy and respiratory retraining, or a trial of tramadol.  04/16/2019 acute extended ov/ Safira Proffit re recurrent cough on gabapentin 300 tid / did not fill tramadol "I told Dr Joya Gaskins it wouldn't work" "only better with oxycodone"  Chief Complaint  Patient presents with  . Acute Visit    Increased cough x 3 wks- prod with white sputum. She has had some wheezing also. She is using her albuterol inhaler at least once per day and rarely uses neb.  Dyspnea:  Minimal  Cough: worse tickle/ no h1 on hand says plans to pick it up  Sleeping: fine 2 pillows / not typically waking her up  SABA use: rarely  02: none  Doesn't think delsym helped/ no h1 on hand / using gabapentin 300 mg tid   gen chest discomfort with severe coughing fits  rec Prednisone 10 mg take  4 each am x 2 days,   2 each am x 2 days,  1 each am x 2 days and stop  Take delsym two tsp every 12 hours and supplement if needed with oxidocone 5 mg up to 2 every 4 hours to suppress the urge to cough.   Once you have eliminated the cough for 3 straight days try reducing the oxycodone first,  then the delsym as tolerated.   Keep your appts with speech therapy  Please schedule a follow up visit in  3 months but call sooner if needed  with all medications /inhalers/ solutions in hand so we can verify exactly what you are taking. This includes all medications from all doctors and over the counters     11/21/2020  f/u ov/Eytan Carrigan re:  GOLD II / breztri better than symbicort  Chief Complaint  Patient presents with  . Follow-up    She states she has alot of SOB when she wakes up in the morning. She rarely uses her albuterol inhaler, but has been using neb almost every night.   Dyspnea:  Gets up around 630 am does better after taking am breztri   Cough: sporadic but dry  / less need for tramadol on gabapentin usually just taking 300 bid  Sleeping: fine 2 pillows  SABA use: every other night sev hours p supper  feels like needs nebulizer even p pm dose of breztri 02: none   No obvious day to day or daytime variability or assoc excess/ purulent sputum or mucus plugs or hemoptysis or cp or chest tightness, subjective wheeze or overt sinus or hb symptoms.   Sleeping  without nocturnal   exacerbation  of respiratory  c/o's or need for noct saba. Also denies any obvious fluctuation of symptoms with weather or environmental changes or other aggravating or alleviating factors except as outlined above   No unusual exposure hx or h/o childhood pna/ asthma or knowledge of premature birth.  Current Allergies, Complete Past Medical History, Past Surgical History, Family History, and Social History were reviewed in [employer] record.  ROS  The following are not active complaints unless bolded Hoarseness, sore throat, dysphagia, dental problems, itching, sneezing,  nasal congestion or discharge of excess mucus or purulent secretions, ear ache,   fever, chills, sweats, unintended wt loss or wt gain, classically pleuritic or exertional cp,  orthopnea pnd or arm/hand swelling  or leg swelling, presyncope, palpitations, abdominal pain, anorexia, nausea, vomiting, diarrhea  or change in  bowel habits or change in bladder habits, change in stools or change in urine, dysuria, hematuria,  rash, arthralgias, visual complaints, headache, numbness, weakness or ataxia or problems with walking or  coordination,  change in mood or  memory.        Current Meds  Medication Sig  . Accu-Chek FastClix Lancets MISC Inject 1 each as directed daily. Use as directed to check blood sugars daily dx: e11.22  . albuterol (VENTOLIN HFA) 108 (90 Base) MCG/ACT inhaler INHALE 2 PUFFS INTO THE LUNGS EVERY 4 HOURS AS NEEDED FOR WHEEZING OR SHORTNESS OF BREATH  . aspirin 81 MG tablet Take 81 mg by mouth daily.   . Biotin 10000 MCG TBDP Take by mouth.  . bisoprolol (ZEBETA) 5 MG tablet TAKE 1/2 TABLET(2.5 MG) BY MOUTH DAILY  . Budeson-Glycopyrrol-Formoterol (BREZTRI AEROSPHERE) 160-9-4.8 MCG/ACT AERO Inhale 2 puffs into the lungs 2 (two) times daily.  Marland Kitchen CALCIUM-MAGNESIUM-ZINC PO Take 1 capsule by mouth daily.  . Cholecalciferol (VITAMIN D3) 2000 UNITS TABS Take 2,000 Units by mouth daily.  . famotidine (PEPCID) 20 MG tablet One after supper  . fluticasone (FLONASE) 50 MCG/ACT nasal spray Place 1 spray into both nostrils daily as needed for allergies or rhinitis.  Marland Kitchen gabapentin (NEURONTIN) 300 MG capsule Take 1 capsule (300 mg total) by mouth 4 (four) times daily.  Marland Kitchen glucose blood (ACCU-CHEK GUIDE) test strip 1 each by Other route daily. Use as instructed to check blood sugars 1 time per day dx: e11.22  . ipratropium-albuterol (DUONEB) 0.5-2.5 (3) MG/3ML SOLN TAKE 3 MILLILITERS BY NEBULIZATION EVERY 6 HOURS AS NEEDED  . lidocaine (LIDODERM) 5 % Place 3 patches onto the skin daily. Remove & Discard patch within 12 hours or as directed by MD  . Magnesium 250 MG TABS Take 1 tablet by mouth daily.  . montelukast (SINGULAIR) 10 MG tablet TAKE 1 TABLET(10 MG) BY MOUTH AT BEDTIME  . pantoprazole (PROTONIX) 40 MG tablet Take 41min before the first meal of the day.  . rosuvastatin (CRESTOR) 20 MG tablet TAKE 1  TABLET(20 MG) BY MOUTH AT BEDTIME  . Semaglutide,0.25 or 0.5MG /DOS, (OZEMPIC, 0.25 OR 0.5 MG/DOSE,) 2 MG/1.5ML SOPN Inject 0.5 mg into the skin once a week. (Patient taking differently: Inject 1 mg into the skin once a week. )  . traMADol (ULTRAM) 50 MG tablet Take 1 tablet (50 mg total) by mouth every 6 (six) hours as needed.  . [DISCONTINUED] predniSONE (DELTASONE) 10 MG tablet Take  4 each am x 2 days,   2 each am x 2 days,  1 each am x 2 days and stop                           Objective:   Physical Exam    11/21/2020   191  04/16/2019      193 07/22/2018      195 04/20/2018      189  10/23/2016     190 04/07/2015         207     Vital signs reviewed  11/21/2020  - Note at rest 02 sats  97% on RA    amb bf nad    HEENT : pt wearing mask not removed for exam due to covid - 19 concerns.    NECK :  without JVD/Nodes/TM/ nl carotid upstrokes bilaterally   LUNGS: no acc muscle use,  Mild barrel  contour chest wall with bilateral  Distant bs s audible wheeze and  without cough on insp or exp maneuvers  and mild  Hyperresonant  to  percussion bilaterally     CV:  RRR  no s3 or  murmur or increase in P2, and no edema   ABD:  soft and nontender with pos end  insp Hoover's  in the supine position. No bruits or organomegaly appreciated, bowel sounds nl  MS:   Nl gait/  ext warm without deformities, calf tenderness, cyanosis or clubbing No obvious joint restrictions   SKIN: warm and dry without lesions    NEURO:  alert, approp, nl sensorium with  no motor or cerebellar deficits apparent.

## 2020-11-21 NOTE — Patient Instructions (Addendum)
Change gabapentin to 300 mg 4 x daily   Change pepcid 20 mg after supper   Only use your albuterol as a rescue medication to be used if you can't catch your breath by resting or doing a relaxed purse lip breathing pattern.  - The less you use it, the better it will work when you need it. - Ok to use up to 2 puffs  every 4 hours if you must but call for immediate appointment if use goes up over your usual need - Don't leave home without it !!  (think of it like the spare tire for your car)   Bed blocks are strongly recommended     Please schedule a follow up visit in 3 months but call sooner if needed

## 2020-11-22 ENCOUNTER — Encounter: Payer: Self-pay | Admitting: Internal Medicine

## 2020-11-22 NOTE — Assessment & Plan Note (Signed)
Quit smoking 2007 PFT: 03/17/2014-  FEV1  1.28 (58%) ratio 66 and no better p saba, ERV 27 and DLCO 56 corrects to 77 - 11/30/2014   try dulera 100 2bid > 01/03/2015 not clear helping,  use prn > flare since mid 02/2015 cough > sob  - 04/07/2015 p extensive coaching HFA effectiveness =    90% > try dulera 100 2bid > improved 04/21/15   - 06/01/2015   try dulera 200 2bid  > did not maintain  - Spirometry 04/14/2017  FEV1 1.13 (58%)  Ratio 52 p saba w/in 4 h > add symb 80 2bid   - 08/19/2017    resumed sym 80 2bid -  03/23/2018    try symb 160 due to new noct cough/ wheeze > improved 04/20/2018  - 07/22/2018   try symb 80 2bid due to UACS - [facility] rec add spiriva smi > pt declined   Group D in terms of symptom/risk and laba/lama/ICS  therefore appropriate rx at this point >>>  Continue breztri 2bid plus prn saba  I spent extra time with pt today reviewing appropriate use of albuterol for prn use on exertion with the following points: 1) saba is for relief of sob that does not improve by walking a slower pace or resting but rather if the pt does not improve after trying this first. 2) If the pt is convinced, as many are, that saba helps recover from activity faster then it's easy to tell if this is the case by re-challenging : ie stop, take the inhaler, then p 5 minutes try the exact same activity (intensity of workload) that just caused the symptoms and see if they are substantially diminished or not after saba 3) if there is an activity that reproducibly causes the symptoms, try the saba 15 min before the activity on alternate days   If in fact the saba really does help, then fine to continue to use it prn but advised may need to look closer at the maintenance regimen being used to achieve better control of airways disease with exertion.

## 2020-11-22 NOTE — Assessment & Plan Note (Addendum)
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 - try off cozar 11/03/2014 >>> - neurontin 100 tid rx 11/30/14 > improved 01/03/2015 > changed to lyrica 04/2015 but not taking consistently as of 01/30/2016  - sinus CT 02/17/15 > neg  - FENO  04/14/17 =  14  - rechallenged with gabapentin 300 qid 07/22/2018 and if not better > [facility]ins [facility] > did not go, referred again 01/07/2019 > see eval 03/02/2019 rec speech therapy/tramadol (pt refused latter)   - flare early Sep 2021 off gabapentin and on breztri > changed back to gabapentin / symb 80 2bid > preferred breztri  - 11/21/2020 rec increase gabapentin to 300 qid to eliminate need for tramadol   Comment: Of the three most common causes of  Sub-acute / recurrent or chronic cough, only one (GERD)  can actually contribute to/ trigger  the other two (asthma and post nasal drip syndrome)  and perpetuate the cylce of cough.  While not intuitively obvious, many patients with chronic low grade reflux do not cough until there is a primary insult that disturbs the protective epithelial barrier and exposes sensitive nerve endings.   This is typically viral but can due to PNDS and  either may apply here.   The point is that once this occurs, it is difficult to eliminate the cycle  using anything but a maximally effective acid suppression regimen at least in the short run, accompanied by an appropriate diet to address non acid GERD and control / eliminate the cough itself if possible with gabapentin up to 300 qid and changed pm pepcid to after supper as that is when the cough and perceived need for saba tends to occur.         Each maintenance medication was reviewed in detail including emphasizing most importantly the difference between maintenance and prns and under what circumstances the prns are to be triggered using an action plan format where  appropriate.  Total time for H and P, chart review, counseling, reviewing inhaler device and generating customized AVS unique to this office visit / charting = 20 min

## 2020-11-27 ENCOUNTER — Other Ambulatory Visit: Payer: Self-pay | Admitting: Internal Medicine

## 2020-11-27 ENCOUNTER — Encounter: Payer: Self-pay | Admitting: Internal Medicine

## 2020-11-28 ENCOUNTER — Telehealth: Payer: Medicare Other

## 2020-11-29 ENCOUNTER — Telehealth: Payer: Self-pay

## 2020-11-29 NOTE — Chronic Care Management (AMB) (Signed)
Chronic Care Management Pharmacy Assistant   Name: [name]  MRN: [id number] DOB: Aug 04, 1946  Reason for Encounter: Patient Assistance Coordination  PCP : Tammy Chard, MD  11/29/2020- Patient assistance forms for Ozempic 1 mg with [name]. Waiting on response from PCP regarding Breztri. Per Pulmonologist Tammy [name] on 11/21/2020 Symbicort was discontinued and [name] was discontinued on 09/14/2020. Patient instructions were to use Albuterol inhaler for PRN use. Checked with PCP office to see if there were any Ozempic samples for patient, there is a sample holding for patient, will notify patient. [name], CPP aware.  11/30/2020- Spoke with patient, she is aware that [name] patient assistance form is awaiting provider signature. Patient will also need to sign and bring proof of income. Patient also informed that the [name] patient assistance forms will need to be signed by her Pulmonologist Tammy [name], since he is prescribing this medication. Patient aware and doesn't feel like that would be a problem because he suggested she use this instead of Symbicort. Patient aware I will get a form ready for her to sign from [name] when she comes to sign form for Ozempic with [name]. I will forward to Tammy [name] when completed by patient. Patient going to [name] office today to sign forms and bring income documentation. Patient also aware sample of Ozempic is available for pick up.  [name], CPP notified.   Allergies:   Allergies  Allergen Reactions  . Other Itching    HAIR DYE    Medications: Outpatient Encounter Medications as of 11/29/2020  Medication Sig  . Accu-Chek FastClix Lancets MISC Inject 1 each as directed daily. Use as directed to check blood sugars daily dx: e11.22  . albuterol (VENTOLIN HFA) 108 (90 Base) MCG/ACT inhaler INHALE 2 PUFFS INTO THE LUNGS EVERY 4 HOURS AS NEEDED FOR WHEEZING OR SHORTNESS OF BREATH  . aspirin 81 MG tablet Take 81 mg by  mouth daily.   . Biotin 10000 MCG TBDP Take by mouth.  . bisoprolol (ZEBETA) 5 MG tablet TAKE 1/2 TABLET(2.5 MG) BY MOUTH DAILY  . Budeson-Glycopyrrol-Formoterol (BREZTRI AEROSPHERE) 160-9-4.8 MCG/ACT AERO Inhale 2 puffs into the lungs 2 (two) times daily.  Marland Kitchen CALCIUM-MAGNESIUM-ZINC PO Take 1 capsule by mouth daily.  . Cholecalciferol (VITAMIN D3) 2000 UNITS TABS Take 2,000 Units by mouth daily.  . famotidine (PEPCID) 20 MG tablet One after supper  . fluticasone (FLONASE) 50 MCG/ACT nasal spray Place 1 spray into both nostrils daily as needed for allergies or rhinitis.  Marland Kitchen gabapentin (NEURONTIN) 300 MG capsule Take 1 capsule (300 mg total) by mouth 4 (four) times daily.  Marland Kitchen glucose blood (ACCU-CHEK GUIDE) test strip 1 each by Other route daily. Use as instructed to check blood sugars 1 time per day dx: e11.22  . ipratropium-albuterol (DUONEB) 0.5-2.5 (3) MG/3ML SOLN TAKE 3 MILLILITERS BY NEBULIZATION EVERY 6 HOURS AS NEEDED  . lidocaine (LIDODERM) 5 % Place 3 patches onto the skin daily. Remove & Discard patch within 12 hours or as directed by MD  . Magnesium 250 MG TABS Take 1 tablet by mouth daily.  . montelukast (SINGULAIR) 10 MG tablet TAKE 1 TABLET(10 MG) BY MOUTH AT BEDTIME  . pantoprazole (PROTONIX) 40 MG tablet Take 100min before the first meal of the day.  . rosuvastatin (CRESTOR) 20 MG tablet TAKE 1 TABLET(20 MG) BY MOUTH AT BEDTIME  . Semaglutide,0.25 or 0.5MG /DOS, (OZEMPIC, 0.25 OR 0.5 MG/DOSE,) 2 MG/1.5ML SOPN Inject 0.5 mg into the skin once a week. (  Patient taking differently: Inject 1 mg into the skin once a week. )  . traMADol (ULTRAM) 50 MG tablet Take 1 tablet (50 mg total) by mouth every 6 (six) hours as needed.   No facility-administered encounter medications on file as of 11/29/2020.    Current Diagnosis: Patient Active Problem List   Diagnosis Date Noted  . Former smoker 08/21/2020  . High cholesterol   . Type II diabetes mellitus, uncontrolled ([place]) 11/09/2018  .  Diarrhea   . Community acquired pneumonia of left lower lobe of lung   . Rhinovirus infection 10/02/2016  . Acute asthma exacerbation 10/02/2016  . COPD with acute exacerbation ([place]) 09/28/2016  . Hypoxia 09/28/2016  . SOB (shortness of breath) 09/28/2016  . COPD exacerbation ([place]) 09/28/2016  . Essential hypertension 11/03/2014  . Cough 10/19/2014  . COPD GOLD II  03/21/2014  . Rhinitis, nonallergic 03/21/2014     Follow-Up:  Patient Assistance Coordination  12/05/2020- [name] form faxed to [name] office for signature and form to be faxed to [name] by Pulmonologist.   12/08/2020- [name] form received with provider signature for [name]. Form faxed to [name] .  Patient called looking for samples of Breztri, she has placed a call to Tammy [name] office with no return call and didn't want to go the weekend without an inhaler. Patient aware I will check with PCP office to see if samples available. Patient aware sample available for pick up today. Patient states she will go to the office now to pick up and was very grateful for the help.   [name], notified.  [name], [facility] Pharmacist Assistant [phone]

## 2020-11-29 NOTE — Telephone Encounter (Cosign Needed)
Yes mam, is Dr Baird Cancer going to prescribe? Or does she need to get this from Pulmonology? Also do we have any samples of Breztri and Ozempic?

## 2020-11-30 ENCOUNTER — Encounter: Payer: Self-pay | Admitting: Internal Medicine

## 2020-11-30 NOTE — Telephone Encounter (Cosign Needed)
I spoke with patient, she is coming today to sign forms and Dr [name] will do the [facility] form, I have that ready for her too. I will be there before she comes to bring forms.

## 2020-12-04 DIAGNOSIS — H43812 Vitreous degeneration, left eye: Secondary | ICD-10-CM | POA: Diagnosis not present

## 2020-12-04 DIAGNOSIS — H2513 Age-related nuclear cataract, bilateral: Secondary | ICD-10-CM | POA: Diagnosis not present

## 2020-12-04 DIAGNOSIS — E119 Type 2 diabetes mellitus without complications: Secondary | ICD-10-CM | POA: Diagnosis not present

## 2020-12-04 DIAGNOSIS — H10413 Chronic giant papillary conjunctivitis, bilateral: Secondary | ICD-10-CM | POA: Diagnosis not present

## 2020-12-04 DIAGNOSIS — H40013 Open angle with borderline findings, low risk, bilateral: Secondary | ICD-10-CM | POA: Diagnosis not present

## 2020-12-04 LAB — HM DIABETES EYE EXAM

## 2020-12-05 ENCOUNTER — Ambulatory Visit: Payer: Medicare Other

## 2020-12-05 DIAGNOSIS — E1165 Type 2 diabetes mellitus with hyperglycemia: Secondary | ICD-10-CM

## 2020-12-05 DIAGNOSIS — E7800 Pure hypercholesterolemia, unspecified: Secondary | ICD-10-CM

## 2020-12-05 DIAGNOSIS — J441 Chronic obstructive pulmonary disease with (acute) exacerbation: Secondary | ICD-10-CM

## 2020-12-05 NOTE — Chronic Care Management (AMB) (Signed)
Chronic Care Management Pharmacy  Name: [name]  MRN: [id number] DOB: Oct 25, 1946  Chief Complaint/ HPI  [name],  74 y.o. , female presents for their Follow-Up CCM visit with the clinical pharmacist via telephone due to COVID-19 Pandemic.  PCP : Tammy Chard, MD  Their chronic conditions include: Hypertension, COPD with chronic cough, diabetes, and high cholesterol  Office Visits: 07/19/20 OV: Presented with complaints of facial pain, sensitive to touch for the last 5 days. Start tramadol 75m every 6 hours as needed for possible trigeminal neuralgia.   07/18/20 Patient message: Pt complained of facial pain and cough. Requesting medication before leaving for vacation. Advised she would need to be evaluated before medications were prescribed.   06/29/20 Telephone call: Pt advised Ozempic arrived at office today from [employer] assistance program. Pt called back and stated what she picked up was 0.552mnot the 57m66mhe has been using  06/14/20 OV: Presented stating she has been wheezing and having trouble breathing for the past 2 weeks. Has a productive cough. Pulmonologist was unable to see pt. Has used inhalers and nebulizer with no relief. Started on prednisone taper and administered Kenalog 7m16mot in office. Encouraged to use nebulizer ever 6 hours as needed.   05/04/20 OV: Labs ordered (HgbA1c, Lipid panel, CMP14+EGFR). Rx for Shingrix sent to local pharmacy. Advised to wait 90 days after second COVID shot to start Shingrix series. Kidney function and A1c improved. Advised to resume walking once able to get LDL down to below 70.  01/04/20: OV with Dr. SandBaird Canceresented for DM check. HgbA1c 6.6% , patient assistance for Ozempic completed. BMP8+EGFR ordered (stable). Vitamin B12 and TSH ordered relating to right leg paresthesias (results w/in normal range), sees Ortho. Rx for Prevnar13 sent to Pharmacy. Follow up in 4 months for re-evaluation.  Consult  Visit: 07/18/20 Patient message: Requested refill for oxycodone or tramadol for cough, but advised she would need to make an appointment in order to get a refill. NP states she would not prescribe either medication for cough. Pt declined to make an appointment at this time.   Patient reported she had surgery on her left shoulder last week (week of April 18th) to repair tendon tears and bone spurs that were causing significant pain. Will wear sling for 6 weeks and perform physical therapy for 3 months. Methocarbamol, oxycodone, acetaminophen, and celecoxib given after surgery. At follow up visit with surgery yesterday patient started on cyclobenzaprine and ketorolac and told to stop celecoxib until she finished 3 days of ketorolac.   02/07/20- CCM Encounter with Dr. PruiBlanca Friendtient assistance for Symbicort application started. Comprehensive medication review.  01/17/20- CCM Encounter with Dr. PruiBlanca Friendtient assistance for Ozempic submitted on 01/12/20. Comprehensive medication review.   12/16/19: Pulmonology Virtual visit with Dr. WertMelvyn Novasronic cough addressed. Reflux, asthma, or post nasal drip syndrome could contribute to the cycle of cough. Prednisone taper, ZPak, Mucinex DM and oxycodone 5mg 38men. Gabapentin also increased to 300mg 30m times daily. Patient instructed to stop albuterol HFA and just use Duoneb solution.   12/08/19: CCM encounter with Dr. PruittBlanca Friendrehensive medication review  Medications: Outpatient Encounter Medications as of 12/05/2020  Medication Sig  . Accu-Chek FastClix Lancets MISC Inject 1 each as directed daily. Use as directed to check blood sugars daily dx: e11.22  . albuterol (VENTOLIN HFA) 108 (90 Base) MCG/ACT inhaler INHALE 2 PUFFS INTO THE LUNGS EVERY 4 HOURS AS NEEDED FOR WHEEZING OR SHORTNESS OF BREATH  . aspirin  81 MG tablet Take 81 mg by mouth daily.   . Biotin 10000 MCG TBDP Take by mouth.  . bisoprolol (ZEBETA) 5 MG tablet TAKE 1/2 TABLET(2.5 MG) BY MOUTH  DAILY  . Budeson-Glycopyrrol-Formoterol (BREZTRI AEROSPHERE) 160-9-4.8 MCG/ACT AERO Inhale 2 puffs into the lungs 2 (two) times daily.  Marland Kitchen CALCIUM-MAGNESIUM-ZINC PO Take 1 capsule by mouth daily.  . Cholecalciferol (VITAMIN D3) 2000 UNITS TABS Take 2,000 Units by mouth daily.  . famotidine (PEPCID) 20 MG tablet One after supper  . fluticasone (FLONASE) 50 MCG/ACT nasal spray Place 1 spray into both nostrils daily as needed for allergies or rhinitis.  Marland Kitchen gabapentin (NEURONTIN) 300 MG capsule Take 1 capsule (300 mg total) by mouth 4 (four) times daily.  Marland Kitchen glucose blood (ACCU-CHEK GUIDE) test strip 1 each by Other route daily. Use as instructed to check blood sugars 1 time per day dx: e11.22  . ipratropium-albuterol (DUONEB) 0.5-2.5 (3) MG/3ML SOLN TAKE 3 MILLILITERS BY NEBULIZATION EVERY 6 HOURS AS NEEDED  . lidocaine (LIDODERM) 5 % Place 3 patches onto the skin daily. Remove & Discard patch within 12 hours or as directed by MD  . Magnesium 250 MG TABS Take 1 tablet by mouth daily.  . montelukast (SINGULAIR) 10 MG tablet TAKE 1 TABLET(10 MG) BY MOUTH AT BEDTIME  . pantoprazole (PROTONIX) 40 MG tablet Take 76mn before the first meal of the day.  . rosuvastatin (CRESTOR) 20 MG tablet TAKE 1 TABLET(20 MG) BY MOUTH AT BEDTIME  . Semaglutide,0.25 or 0.5MG/DOS, (OZEMPIC, 0.25 OR 0.5 MG/DOSE,) 2 MG/1.5ML SOPN Inject 0.5 mg into the skin once a week. (Patient taking differently: Inject 1 mg into the skin once a week. )  . traMADol (ULTRAM) 50 MG tablet Take 1 tablet (50 mg total) by mouth every 6 (six) hours as needed.   No facility-administered encounter medications on file as of 12/05/2020.   Current Diagnosis/Assessment:  Goals Addressed              This Visit's Progress   .  Pharmacy Care Plan (pt-stated)        CARE PLAN ENTRY (see longitudinal plan of care for additional care plan information)  Current Barriers:  . Chronic Disease Management support, education, and care coordination needs  related to Hypertension, Hyperlipidemia, and Diabetes   Hypertension BP Readings from Last 3 Encounters:  11/21/20 130/76  10/16/20 118/70  09/06/20 122/80   . Pharmacist Clinical Goal(s): o Over the next 90 days, patient will work with PharmD and providers to maintain BP goal <130/80 . Current regimen:  o Hydrochlorothiazide 25 mg tablet - take daily  o Bisoprolol 5 mg - take 1/2 tablet daily  . Interventions: o Patient to check BP at home  o Discussed the importance of a healthy diet  . Patient self care activities - Over the next 90 days, patient will: o Check BP 1-3 times per week, document, and provide at future appointments o Ensure daily salt intake < 2300 mg/day  Hyperlipidemia Lab Results  Component Value Date/Time   LDLCALC 78 05/04/2020 11:38 AM   . Pharmacist Clinical Goal(s): o Over the next 90 days, patient will work with PharmD and providers to achieve LDL goal < 70 . Current regimen:  o Rosuvastatin 20 mg-take daily . Interventions: o Discussed the importance of healthy eating o The importance of medication adherence . Patient self care activities - Over the next 90 days, patient will: o Patient to reduce the amount of high fat food they eat.  o Try to do some exercise at least 30 minutes per day for 1-3 days.   Diabetes Lab Results  Component Value Date/Time   HGBA1C 6.3 (H) 09/06/2020 02:25 PM   HGBA1C 6.2 (H) 05/04/2020 11:38 AM   . Pharmacist Clinical Goal(s): o Over the next 90 days, patient will work with PharmD and providers to maintain A1c goal <7% . Current regimen:  o Ozempic 0.5 MG/Dose- Inject 0.5 mg in to the skin once  a week  . Interventions: o Helped patient with patient assistance enrollment forms for 2021 . Patient self care activities - Over the next 90 days, patient will: o Check blood sugar twice daily, document, and provide at future appointments o Contact provider with any episodes of hypoglycemia  Medication  management . Pharmacist Clinical Goal(s): o Over the next 90 days, patient will work with PharmD and providers to maintain optimal medication adherence . Current pharmacy: [facility] [phone]  . Interventions o Comprehensive medication review performed. o Continue current medication management strategy . Patient self care activities - Over the next 90 days, patient will: o Focus on medication adherence by taking medication on a consistent schedule  o Take medications as prescribed o Report any questions or concerns to PharmD and/or provider(s)  Initial goal documentation        COPD with chronic cough   Last spirometry score: FEV1 1.1 (58% predicted) , FVC 2.2, FEV/FVC 52% on 04/14/17  Gold Grade: Gold 2 (FEV1 50-79%)  Eosinophil count:   Lab Results  Component Value Date/Time   EOSPCT 1.2 10/16/2020 11:01 AM                                 Eos (Absolute):  Lab Results  Component Value Date/Time   EOSABS 0.1 10/16/2020 11:01 AM   Tobacco Status:  Social History   Tobacco Use  Smoking Status Former Smoker  . Packs/day: 0.25  . Years: 42.00  . Pack years: 10.50  . Types: Cigarettes  . Quit date: 01/30/2006  . Years since quitting: 14.8  Smokeless Tobacco Never Used   Patient has failed these meds in past: Breo, Dulera, Spiriva, Symbicort  Patient is currently controlled on the following medications:   Breztri - 2 inhalations twice daily  Ventolin 2 puffs into lungs every 4 hours as needed for wheezing/ SOB  Duoneb 1 vial every 6 hours prn  Montelukast 18m daily  Chronic cough currently treated with the following medications:  Famotidine 233mdaily  Pantoprazole 4063maily  Gabapentin 300m63mtimes daily (patient only taking twice daily)  Using maintenance inhaler regularly? Yes Frequency of rescue inhaler use:  infrequently; patient reports she has not had to use Ventolin or Duoneb since January  We discussed:   Importance of compliance with  maintenance inhaler; patient reports 100% compliance  Pt states breathing has been better recently  Patient has samples of Breztri from Dr. WertEdmonia Lynchistance paperwork sent to pulmonologist   Plan Continue current medications ,  Diabetes   Recent Relevant Labs: Lab Results  Component Value Date/Time   HGBA1C 6.3 (H) 09/06/2020 02:25 PM   HGBA1C 6.2 (H) 05/04/2020 11:38 AM   MICROALBUR 10 09/06/2020 12:54 PM   MICROALBUR 10 09/01/2019 02:42 PM   Kidney Function Lab Results  Component Value Date   CREATININE 0.76 09/06/2020   CREATININE 0.76 05/04/2020   CREATININE 0.90 01/04/2020      Component Value  Date/Time   GFRNONAA 79 09/06/2020 1429   GFRAA 91 09/06/2020 1429    Stage 2 CKD- Stable  Checking BG: Rarely  Patient has failed these meds in past: Victoza Patient is currently controlled on the following medications:   Ozempic 63m weekly  Last diabetic Foot exam: 09/01/19 Last diabetic Eye exam: 10/19/19 Lab Results  Component Value Date/Time   HMDIABEYEEXA No Retinopathy 12/04/2020 12:00 AM    We discussed:  Checked BG for about a week when having facial problems and reports that it was not high (90s) Diet and exercise extensively  Does not eat breakfast (just a cup of coffee)  Does not often eat lunch  Sometimes snacks on fruit, tomato sandwich  Meat and vegetables for dinner  Pt drinks about a half gallon of water daily  Doing physical therapy twice weekly for shoulder (helping with shoulder pain)  Encouraged patient to resume exercising as soon as possible post-surgery with a goal of 150 minutes per week.   Discussed goal 30 minutes 5 times weekly  Checking on Ozempic 114mshipment from [employer] assistance program  Plan Continue current medications   Hypertension   Office blood pressures are  BP Readings from Last 3 Encounters:  11/21/20 130/76  10/16/20 118/70  09/06/20 122/80   Patient has failed these meds in the  past: Lisinopril, losartan, Bystolic -ACEI/ARB contraindicated/not tolerated in this patient due to history of chronic cough  Patient checks BP at home Never, does not have a meter at home to check with  Patient is currently controlled on the following medications:   Bisoprolol 46m546m/2 tablet daily  We discussed  Diet and exercise extensively  Recommend check BP periodically  Pt does not have BP meter at home and does not want to purchase one right now  Advised that she can obtain through insurance, but there will be a small fee  Plan Continue current medications    High Cholesterol   Lipid Panel     Component Value Date/Time   CHOL 146 05/04/2020 1138   TRIG 119 05/04/2020 1138   HDL 47 05/04/2020 1138   CHOLHDL 3.1 05/04/2020 1138   LDLDelmar 05/04/2020 1138   LABVLDL 21 05/04/2020 1138    The ASCVD Risk score (Goff DC Jr., et al., 2013) failed to calculate for the following reasons:   Unable to determine if patient is Non-Hispanic African American   Patient has failed these meds in past: N/A Patient is currently uncontrolled on the following medications:   Rosuvastatin 42m77mily  Aspirin 81mg17me discussed:   Diet and exercise extensively  Increasing exercise as recommended could help pt reach cholesterol goal  Plan Continue current medications   Pain Management Post-Surgery   Patient initially used methocarbamol 500mg 6my 6 to 8 hours as needed, celecoxib 100mg t77m daily, acetaminophen 500mg 2 41mets every 6 to 8 hours as needed, and oxycodone 46mg 2 ta646mts every 4 hours as needed. Patient stopped oxycodone after a few days due to ineffectiveness.  At follow up appointment with surgery on 4/27, patient was given cyclobenzaprine 10mg ever26mhours and ketorolac 10mg every23mours for 3 days. Told to hold celecoxib until ketorolac course finished.  Plan Continue Physical therapy as indicated Will discuss with patient at next follow up visit.    Health Maintenance   Patient is currently  on the following medications:   Biotin 10,000mcg daily14m her nails (when she has it)  Calcium 1000mg/Mag 4007minc56m  73m w/ Vitamin D 138m daily  Cholecalciferol 2000 units daily  Fiber gummy (4g) twice daily  We discussed:    Patient is now taking a fiber supplement daily to help with weight management  Plan Continue current medications  Vaccines   Reviewed and discussed patient's vaccination history.    Immunization History  Administered Date(s) Administered  . Fluad Quad(high Dose 65+) 09/06/2020  . Influenza Split 09/29/2013, 09/30/2015  . Influenza Whole 08/30/2016  . Influenza, High Dose Seasonal PF 09/29/2017, 09/14/2018, 09/01/2019  . Influenza,inj,Quad PF,6+ Mos 09/12/2014  . Influenza-Unspecified 09/14/2018  . PFIZER SARS-COV-2 Vaccination 01/19/2020, 02/09/2020  . Pneumococcal Conjugate-13 01/06/2020  . Pneumococcal Polysaccharide-23 10/07/2013  . Pneumococcal-Unspecified 10/05/2013  . Tdap 06/07/2019, 06/09/2019   Patient received Prevnar at pharmacy 01/06/20  We discussed:  Patient has received both doses of the shingrix vaccine.  Plan Recommended patient receive Shingrix vaccine series at pharmacy.   Medication Management   [facility] Uses pill box? Yes Pt endorses 100% compliance with maintenance medications  We discussed the importance of medication adherence.  Plan Continue current medication management strategy  Follow up: 3 month phone visit  VaOrlando PennerPharmD Clinical Pharmacist [facility] [phone]

## 2020-12-05 NOTE — Progress Notes (Signed)
Chronic Care Management    Visit Note  02/07/2020 Name: ABBI MANCINI MRN: [id number] DOB: 10-26-1946  Referred by: [name], MD Reason for referral : Chronic Care Management (Follow Up)   KRESHA ABELSON is a 74 y.o. year old female who is a primary care patient of [name], MD. Patient reports for her follow up visit with the pharmacist for CCM services via telehealth.     Medications: Outpatient Encounter Medications as of 12/05/2020  Medication Sig   Accu-Chek FastClix Lancets MISC Inject 1 each as directed daily. Use as directed to check blood sugars daily dx: e11.22   albuterol (VENTOLIN HFA) 108 (90 Base) MCG/ACT inhaler INHALE 2 PUFFS INTO THE LUNGS EVERY 4 HOURS AS NEEDED FOR WHEEZING OR SHORTNESS OF BREATH   aspirin 81 MG tablet Take 81 mg by mouth daily.    Biotin 10000 MCG TBDP Take by mouth.   bisoprolol (ZEBETA) 5 MG tablet TAKE 1/2 TABLET(2.5 MG) BY MOUTH DAILY   Budeson-Glycopyrrol-Formoterol (BREZTRI AEROSPHERE) 160-9-4.8 MCG/ACT AERO Inhale 2 puffs into the lungs 2 (two) times daily.   CALCIUM-MAGNESIUM-ZINC PO Take 1 capsule by mouth daily.   Cholecalciferol (VITAMIN D3) 2000 UNITS TABS Take 2,000 Units by mouth daily.   famotidine (PEPCID) 20 MG tablet One after supper   fluticasone (FLONASE) 50 MCG/ACT nasal spray Place 1 spray into both nostrils daily as needed for allergies or rhinitis.   gabapentin (NEURONTIN) 300 MG capsule Take 1 capsule (300 mg total) by mouth 4 (four) times daily.   glucose blood (ACCU-CHEK GUIDE) test strip 1 each by Other route daily. Use as instructed to check blood sugars 1 time per day dx: e11.22   ipratropium-albuterol (DUONEB) 0.5-2.5 (3) MG/3ML SOLN TAKE 3 MILLILITERS BY NEBULIZATION EVERY 6 HOURS AS NEEDED   lidocaine (LIDODERM) 5 % Place 3 patches onto the skin daily. Remove & Discard patch within 12 hours or as directed by MD   Magnesium 250 MG TABS Take 1 tablet by mouth daily.   montelukast  (SINGULAIR) 10 MG tablet TAKE 1 TABLET(10 MG) BY MOUTH AT BEDTIME   pantoprazole (PROTONIX) 40 MG tablet Take 43mn before the first meal of the day.   rosuvastatin (CRESTOR) 20 MG tablet TAKE 1 TABLET(20 MG) BY MOUTH AT BEDTIME   Semaglutide,0.25 or 0.5MG/DOS, (OZEMPIC, 0.25 OR 0.5 MG/DOSE,) 2 MG/1.5ML SOPN Inject 0.5 mg into the skin once a week. (Patient taking differently: Inject 1 mg into the skin once a week. )   traMADol (ULTRAM) 50 MG tablet Take 1 tablet (50 mg total) by mouth every 6 (six) hours as needed.   No facility-administered encounter medications on file as of 12/05/2020.     .     Objective:   Goals Addressed              This VBranch(pt-stated)        CARE PLAN ENTRY (see longitudinal plan of care for additional care plan information)  Current Barriers:   Chronic Disease Management support, education, and care coordination needs related to Hypertension, Hyperlipidemia, and Diabetes   Hypertension BP Readings from Last 3 Encounters:  11/21/20 130/76  10/16/20 118/70  09/06/20 122/80    Pharmacist Clinical Goal(s): o Over the next 90 days, patient will work with PharmD and providers to maintain BP goal <130/80  Current regimen:  o Hydrochlorothiazide 25 mg tablet - take daily  o Bisoprolol 5 mg - take 1/2 tablet daily  Interventions: o Patient to check BP at home  o Discussed the importance of a healthy diet   Patient self care activities - Over the next 90 days, patient will: o Check BP 1-3 times per week, document, and provide at future appointments o Ensure daily salt intake < 2300 mg/day  Hyperlipidemia Lab Results  Component Value Date/Time   [facility] 78 05/04/2020 11:38 AM    Pharmacist Clinical Goal(s): o Over the next 90 days, patient will work with PharmD and providers to achieve LDL goal < 70  Current regimen:  o Rosuvastatin 20 mg-take daily  Interventions: o Discussed the importance of  healthy eating o The importance of medication adherence  Patient self care activities - Over the next 90 days, patient will: o Patient to reduce the amount of high fat food they eat.  o Try to do some exercise at least 30 minutes per day for 1-3 days.   Diabetes Lab Results  Component Value Date/Time   HGBA1C 6.3 (H) 09/06/2020 02:25 PM   HGBA1C 6.2 (H) 05/04/2020 11:38 AM    Pharmacist Clinical Goal(s): o Over the next 90 days, patient will work with PharmD and providers to maintain A1c goal <7%  Current regimen:  o Ozempic 0.5 MG/Dose- Inject 0.5 mg in to the skin once  a week   Interventions: o Helped patient with patient assistance enrollment forms for 2021  Patient self care activities - Over the next 90 days, patient will: o Check blood sugar twice daily, document, and provide at future appointments o Contact provider with any episodes of hypoglycemia  Medication management  Pharmacist Clinical Goal(s): o Over the next 90 days, patient will work with PharmD and providers to maintain optimal medication adherence  Current pharmacy: [facility] [phone]   Interventions o Comprehensive medication review performed. o Continue current medication management strategy  Patient self care activities - Over the next 90 days, patient will: o Focus on medication adherence by taking medication on a consistent schedule  o Take medications as prescribed o Report any questions or concerns to PharmD and/or provider(s)  Initial goal documentation       Plan:   The care management team will reach out to the patient again over the next 30 days.   Hypertension   BP goal is:  <130/80  Office blood pressures are  BP Readings from Last 3 Encounters:  11/21/20 130/76  10/16/20 118/70  09/06/20 122/80   Patient checks BP at home never Patient home BP readings are ranging: none reported   Patient has failed these meds in the past: none noted  Patient is currently  controlled on the following medications:   Bisoprolol 5 MG-Taking 1/2 tablet by mouth daily.   We discussed  - Diet and Exercise extensively  -She usually eats one meal a day and reports eating poorly.   -Today she is going to eat "oodles and noodles" because that is all she would like to               eat today.   -She has always ate only one meal a day             -She does not eat regularly   -Breakfast she has a cup of coffee and then has dinner at Night.              -Limiting the amount of salt she eats   -Increasing fresh vegetables   Plan  Continue current medications  Diabetes   A1c goal <7%  Recent Relevant Labs: Lab Results  Component Value Date/Time   HGBA1C 6.3 (H) 09/06/2020 02:25 PM   HGBA1C 6.2 (H) 05/04/2020 11:38 AM   MICROALBUR 10 09/06/2020 12:54 PM   MICROALBUR 10 09/01/2019 02:42 PM    Last diabetic Eye exam:  Lab Results  Component Value Date/Time   HMDIABEYEEXA No Retinopathy 12/04/2020 12:00 AM    Last diabetic Foot exam: No results found for: HMDIABFOOTEX   Checking BG: Rarely - She checks her blood sugar about once a month if that. Patient reports that she has checked her BS in the past but she stopped checking it.  Recent FBG Readings:  None reported    Patient has failed these meds in past: none noted Patient is currently controlled on the following medications:  Ozempic 0.5 MG - inject once a week  o She is taking two shots for 1 MG total  - She has 4 weeks left of medication    We discussed: how to recognize and treat signs of hypoglycemia    -Patient found out that she had diabetes due to a yeast infection that would not go away.    - She had her blood sugar checked at the gynecologist and found out that she was a diabetic   - Symptoms of diabetes that her past provider told her about she did not have    - Since she has seen Dr. Baird Cancer she does not check her BS    - The symptoms of hypoglycemia   - New Glucose Monitor - an  affordable newer model.   -Checking her BS on a routine basis      -Assist with patient assistance for 2022.  Plan   -Patient will start checking her BS on Monday, Wednesday and Friday. Continue current medications   Hyperlipidemia   LDL goal < 70  Last lipids Lab Results  Component Value Date   CHOL 146 05/04/2020   HDL 47 05/04/2020   LDLCALC 78 05/04/2020   TRIG 119 05/04/2020   CHOLHDL 3.1 05/04/2020   Hepatic Function Latest Ref Rng & Units 05/04/2020 09/01/2019 10/06/2018  Total Protein 6.0 - 8.5 g/dL 7.1 7.7 7.2  Albumin 3.7 - 4.7 g/dL 4.6 4.7 4.4  AST 0 - 40 IU/L 34 22 26  ALT 0 - 32 IU/L '21 11 11  ' Alk Phosphatase 39 - 117 IU/L 74 70 68  Total Bilirubin 0.0 - 1.2 mg/dL 0.2 0.4 0.3     The ASCVD Risk score (Tyler., et al., 2013) failed to calculate for the following reasons:   Unable to determine if patient is Non-Hispanic African American   Patient has failed these meds in past: none noted  Patient is currently uncontrolled on the following medications:   Crestor 20 MG - 1 tablet daily at bedtime   We discussed:       -  Diet and Exercise extensively           -Planning to add vegetables to her once a day meal on mondays, Wednesday and fridays  Plan  Continue current medications    COPD   Last spirometry score:  2015  Gold Grade: Gold 2 (FEV1 50-79%) Current COPD Classification:  B (high sx, <2 exacerbations/yr) No flowsheet data found. Lab Results  Component Value Date/Time   EOSPCT 1.2 10/16/2020 11:01 AM   EOSABS 0.1 10/16/2020 11:01 AM    Patient has failed these meds in past: Symbicort  Patient  is currently controlled on the following medications:   Breztri Aerosphere 160-9-4.8 - Inhale 2 puffs into the lungs two times daily.   Duoneb 0.5-2.5 3 mg/84m- Take 3 ml by nebulizer every 6 hours as needed. (rarely having to use this medication)  Albuterol HFA- use 2 puffs every 4 to 6 hours as needed for wheezing   Using maintenance inhaler  regularly? Yes Frequency of rescue inhaler use:  infrequently  We discussed: Completion of patients patient assistance form.  Seen by Dr. WMelvyn Novason 11/21/2020 and the appointment went well, she is scheduled to be seen in three months.    Plan  Continue current medications  GERD   Patient has failed these meds in past: none noted  Patient is currently controlled on the following medications:   Pantoprazole 40 MG - Taking 1 tablet in the morning daily   Famotidine 20 MG- Taking one tablet at bedtime   We discussed:    -Per patient Dr. WMelvyn Novasstarted her on this medication   - Will discuss with Dr. WMelvyn Novasat her next appointment   Plan  Continue current medications   Medication Management   [facility]Phone: [phone]: [phone] Uses pill box? Yes Pt endorses 100% compliance  We discussed: Discussed benefits of medication synchronization, packaging and delivery as well as enhanced pharmacist oversight with [employer].  Plan  Continue current medication management strategy    Follow up: 3 month phone visit  VOrlando Penner PharmD Clinical Pharmacist [facility] [phone]

## 2020-12-08 MED ORDER — BREZTRI AEROSPHERE 160-9-4.8 MCG/ACT IN AERO: 2.0000 | INHALATION_SPRAY | Freq: Two times a day (BID) | RESPIRATORY_TRACT | 0 refills | Status: DC

## 2020-12-08 NOTE — Telephone Encounter (Signed)
Samples for patient have been placed up front and message sent to patient. Nothing further needed at this time.

## 2020-12-18 ENCOUNTER — Encounter: Payer: Self-pay | Admitting: Internal Medicine

## 2020-12-20 ENCOUNTER — Ambulatory Visit: Payer: Medicare Other

## 2020-12-20 DIAGNOSIS — E1122 Type 2 diabetes mellitus with diabetic chronic kidney disease: Secondary | ICD-10-CM

## 2020-12-20 DIAGNOSIS — I10 Essential (primary) hypertension: Secondary | ICD-10-CM

## 2020-12-20 DIAGNOSIS — J441 Chronic obstructive pulmonary disease with (acute) exacerbation: Secondary | ICD-10-CM

## 2020-12-20 DIAGNOSIS — N182 Chronic kidney disease, stage 2 (mild): Secondary | ICD-10-CM

## 2020-12-20 NOTE — Patient Instructions (Addendum)
Visit Information  Goals Addressed              This Visit's Progress   .  Pharmacy Care Plan (pt-stated)        CARE PLAN ENTRY (see longitudinal plan of care for additional care plan information)  Current Barriers:  . Chronic Disease Management support, education, and care coordination needs related to Hypertension, Hyperlipidemia, and Diabetes   Hypertension BP Readings from Last 3 Encounters:  11/21/20 130/76  10/16/20 118/70  09/06/20 122/80   . Pharmacist Clinical Goal(s): o Over the next 90 days, patient will work with PharmD and providers to maintain BP goal <130/80 . Current regimen:  o Hydrochlorothiazide 25 mg tablet - take daily  o Bisoprolol 5 mg - take 1/2 tablet daily  . Interventions: o Patient to check BP at home  o Discussed the importance of a healthy diet  . Patient self care activities - Over the next 90 days, patient will: o Check BP 1-3 times per week, document, and provide at future appointments o Ensure daily salt intake < 2300 mg/day  Hyperlipidemia Lab Results  Component Value Date/Time   LDLCALC 78 05/04/2020 11:38 AM   . Pharmacist Clinical Goal(s): o Over the next 90 days, patient will work with PharmD and providers to achieve LDL goal < 70 . Current regimen:  o Rosuvastatin 20 mg-take daily . Interventions: o Discussed the importance of healthy eating o The importance of medication adherence . Patient self care activities - Over the next 90 days, patient will: o Patient to reduce the amount of high fat food they eat.  o Try to do some exercise at least 30 minutes per day for 1-3 days.   Diabetes Lab Results  Component Value Date/Time   HGBA1C 6.3 (H) 09/06/2020 02:25 PM   HGBA1C 6.2 (H) 05/04/2020 11:38 AM   . Pharmacist Clinical Goal(s): o Over the next 90 days, patient will work with PharmD and providers to maintain A1c goal <7% . Current regimen:  o Ozempic 0.5 MG/Dose- Inject 0.5 mg in to the skin once  a week   . Interventions: o Helped patient with patient assistance enrollment forms for 2021 . Patient self care activities - Over the next 90 days, patient will: o Check blood sugar twice daily, document, and provide at future appointments o Contact provider with any episodes of hypoglycemia  Medication management . Pharmacist Clinical Goal(s): o Over the next 90 days, patient will work with PharmD and providers to maintain optimal medication adherence . Current pharmacy: [facility] [phone]  . Interventions o Comprehensive medication review performed. o Continue current medication management strategy . Patient self care activities - Over the next 90 days, patient will: o Focus on medication adherence by taking medication on a consistent schedule  o Take medications as prescribed o Report any questions or concerns to PharmD and/or provider(s)  Initial goal documentation        Ms. Geister was given information about Chronic Care Management services today including:  1. CCM service includes personalized support from designated clinical staff supervised by her physician, including individualized plan of care and coordination with other care providers 2. 24/7 contact phone numbers for assistance for urgent and routine care needs. 3. Standard insurance, coinsurance, copays and deductibles apply for chronic care management only during months in which we provide at least 20 minutes of these services. Most insurances cover these services at 100%, however patients may be responsible for any copay, coinsurance and/or deductible  if applicable. This service may help you avoid the need for more expensive face-to-face services. 4. Only one practitioner may furnish and bill the service in a calendar month. 5. The patient may stop CCM services at any time (effective at the end of the month) by phone call to the office staff.  Patient agreed to services and verbal consent obtained.   The patient  verbalized understanding of instructions, educational materials, and care plan provided today and declined offer to receive copy of patient instructions, educational materials, and care plan.  The pharmacy team will reach out to the patient again over the next 90 days.   [name], PharmD Clinical Pharmacist [facility] [phone]

## 2020-12-25 NOTE — Telephone Encounter (Signed)
Sorry to hear that - nothing else to offer other than be evaluated in UC or ER where covid can be checked as I see she hasn't had booster per our records and even if she had she would need this checked before going any further.  If this is negative then can do televisit - it it's positive will need refer to monoclonal antibody center

## 2020-12-25 NOTE — Telephone Encounter (Signed)
Pt was notifed of Dr. Gustavus Bryant recommendations via My Chart response. Pt was informed to notify office of Covid test results. Nothing further needed at this time.

## 2020-12-25 NOTE — Telephone Encounter (Signed)
Dr. [name] please respond to the following My Chart message:  In past 4-5days, have had extreme coughing unlike any other cough. Coughing so intense, am unable to breath. Have had to use nebulizer mainly after each coughing episode but also have to use it more than usual lately.  This cough wakes me up from sound sleep. It also gives me a severe pounding headache after each episode. Along with the coughing, It appears like I have flu symptoms but no fever (sneezing, coughing, body aching, hot and cold sweats, wheezing). Tramadol isn't working at all this time. It is scaring me a lot. What do I do now?     Hello. Have not been around anyone with cough. When I'm able to cough and expel something from throat, it's sort of thick mucus a tad yellow. When I cough, it always feels like I should be able to cough up what's in my throat, but I'm not able to. It seems like the mucus stops at the top of throat and it won't come out.   Thank you

## 2020-12-26 NOTE — Telephone Encounter (Signed)
Dr. [name] please advise on patient mychart message. Im not sure if she got booster or covid tested. I'm waiting on a response from patient.   I am scheduled to get my Booster shot today (12/26/20) at 4:00. Should I try to get covid test before I get the booster? Also does the covid test have to be done at an Urgent Care or ER?

## 2020-12-28 ENCOUNTER — Ambulatory Visit (INDEPENDENT_AMBULATORY_CARE_PROVIDER_SITE_OTHER): Payer: Medicare Other | Admitting: Internal Medicine

## 2020-12-28 ENCOUNTER — Encounter: Payer: Self-pay | Admitting: Internal Medicine

## 2020-12-28 ENCOUNTER — Other Ambulatory Visit: Payer: Self-pay

## 2020-12-28 DIAGNOSIS — J449 Chronic obstructive pulmonary disease, unspecified: Secondary | ICD-10-CM

## 2020-12-28 DIAGNOSIS — Z20828 Contact with and (suspected) exposure to other viral communicable diseases: Secondary | ICD-10-CM | POA: Diagnosis not present

## 2020-12-28 DIAGNOSIS — R059 Cough, unspecified: Secondary | ICD-10-CM | POA: Diagnosis not present

## 2020-12-28 MED ORDER — PREDNISONE 10 MG PO TABS: ORAL_TABLET | ORAL | 0 refills | Status: DC

## 2020-12-28 MED ORDER — OXYCODONE HCL 5 MG PO TABS: 5.0000 mg | ORAL_TABLET | ORAL | 0 refills | Status: DC | PRN

## 2020-12-28 MED ORDER — AZITHROMYCIN 250 MG PO TABS: ORAL_TABLET | ORAL | 0 refills | Status: DC

## 2020-12-28 NOTE — Progress Notes (Signed)
Subjective:   Patient ID: [name], female    DOB: [birth year]    MRN: [id number]    Brief patient profile:  28  yobf mother of a PA @ [place]  fast med  quit smoking 2007 with chronic cough x 2011 self referred for evaluation of persistent cough in setting of technically GOLD II copd dx 02/2014 with only mild obstructive pattern - has noted since high school a tendency to daily  tickle in throat causing intermittent cough waxing and waning ever since    History of Present Illness  10/19/2014 1st [facility] office visit/ [name]   Chief Complaint  Patient presents with  . Pulmonary Consult    Former pt of Dr. Annamaria Boots. Pt c/o increased cough x 3 wks. Cough is prod with moderate, thick, light yellow sputum.  She coughs until loses urinary continence.  Not sleeping well due to cough.   remembers having tendency to throat tickle in high school  but completely resolved by graduation  then recurred in her 34s and resolved with codeine then recurred in 2011  but bad to worse x 3 weeks assoc with sensation of choking, mucus is thick light yellow.  acei stopped about a month prior to OV  Replaced by losartan  Inhalers make it worse, esp dpi  Kouffman Reflux v Neurogenic Cough Differentiator Reflux Comments  Do you awaken from a sound sleep coughing violently?                            With trouble breathing? Yes   Do you have choking episodes when you cannot  Get enough air, gasping for air ?              Yes   Do you usually cough when you lie down into  The bed, or when you just lie down to rest ?                          Yes   Do you usually cough after meals or eating?         maybe   Do you cough when (or after) you bend over?    No    GERD SCORE     Kouffman Reflux v Neurogenic Cough Differentiator Neurogenic   Do you more-or-less cough all day long? yes   Does change of temperature make you cough? no   Does laughing or chuckling cause you to cough? no   Do fumes (perfume,  automobile fumes, burned  Toast, etc.,) cause you to cough ?      Not aware   Does speaking, singing, or talking on the phone cause you to cough   ?               sometimes   Neurogenic/Airway score      rec  Max rx gerd/prn tramadol        09/15/2017  Transition of care  f/u ov/[name] re: COPD II s/p aecopd Chief Complaint  Patient presents with  . Hospitalization Follow-up    Breathing has improved some, but not back at her normal baseline. She states her cough woke her up this am and had some bloody nasal d/c.  She has been coughing up some thick, yellow sputum.  She is using   walking all over [place] fine,  Flew back to [place]  on a commercial jet and short  of breath  walking from gate to baggage [place] then by 2 days later in ER due to severe sob / tightness across upper back >> albuterol relived the discomfort w/in 5 min but did not have her rescue so had to call 911 (proair listed on med calendar she just got from me 08/19/17 under written action plan) - says was so short of breath at that point could not have used saba anyway. Not following action plan either re controlling cough which is also worse/ has not started meds from discharge  rec Plan A = Automatic = symbicort 80 Take 2 puffs first thing in am and then another 2 puffs about 12 hours later.  Work on Interior and spatial [employer]: Plan B = Backup Only use your albuterol as a rescue medication       03/23/2018 acute extended  ov/[name] re:   Copd  - extended ov re new noct cough /wheeze and need for saba / no med calendar Chief Complaint  Patient presents with  . Acute Visit    Breathing is overall doing well. She uses her albuterol inhaler 4 x per wk on average. She has not needed her neb.  Mid February breathing /coughing  worse / wheezing every noct needs albuterol every noct Very confused with instructions/ now has neb but did not disclose med she uses at the med calendar ov and did not bring the med calendar with her  nor the med for the nebulizer and easily frustrated re concept of med reconciliation  Daytime sob = MMRC2 = can't walk a nl pace on a flat grade s sob but does fine slow and flat  rec Plan A = Automatic = symbicort 160 Take 2 puffs first thing in am and then another 2 puffs about 12 hours later.  Plan B = Backup Only use your albuterol as a rescue medication  Plan C = Crisis - only use your albuterol nebulizer if you first try Plan B and it fails to help > ok to use the nebulizer up to every 4 hours but if start needing it regularly call for immediate appointment     04/20/2018  f/u ov/[name] re:   Copd gold II / brought med calendar but not clear using consistently / chronic cough/ hbp Chief Complaint  Patient presents with  . Follow-up    COPD , feel good no problems at this time  Dyspnea:  Dances   2- 3 songs / shopping is ok  Cough: better, mostly dry/ throat clearing / noct  Tessalon maybe once every couple days  "it says to take it 3 x daily"  Sleep: better  while maint on symbicort 160 2bid Sleeping fine on 1 pillow     Symbicort 80 or 160 should be  Take 2 puffs first thing in am and then another 2 puffs about 12 hours later- you can pick which one you prefer but if  they are the same then use the lower strength Please schedule a follow up visit in 3 months but call sooner if needed     07/22/2018  f/u ov/[name] re:   GOLD II copd / has calendar not using the action plans at bottom/ since previous ov stopped lyrica and took gabapentin 100 tid x 3 weeks then stopped (was supposed to stay on until ov) Chief Complaint  Patient presents with  . Follow-up    Cough has improved slightly but "still driving me crazy"- prod with clear sputum.  She  uses her proair 2 x per wk and rarely uses neb.   Dyspnea:   MMRC2 = can't walk a nl pace on a flat grade s sob but does fine slow and flat  Cough  Tickle/ gag anytime day except doesn't wake her up goes back to childhood SABA use: twice daily on  bad days but recently less / hardly ever uses neb  Says only thing that stops her cough is oxycodone rec Take delsym two tsp every 12 hours and supplement if needed with  Oxycodone  up to 1 every 4 hours to suppress the urge to cough.  Once you have eliminated the cough for 3 straight days try reducing the oxycodone first,  then the delsym as tolerated.   For drainage / throat tickle try take CHLORPHENIRAMINE  4 mg - take one every 4 hours as needed   Reduce symbicort 80 Take 2 puffs first thing in am and then another 2 puffs about 12 hours later. > did not continue consistently "you didn't make this clear"  Gabapentin 300 mg four times a day - if not doing better we need to refer you to [facility] / Dr Joya Gaskins > did not do  See Tammy NP in 6  weeks with all your medications>  Did not do    01/07/2019  Acute extended  ov/Yasmene Salomone re:  Flare of wheeze off symbicort, cough never resolved / no med calendar/ poor insight into meds/ action plans  Chief Complaint  Patient presents with  . Acute Visit    pt c/o wheezing, prod cough with gray/white mucus, pnd, stuffy nose.   Dyspnea:  MMRC3 = can't walk 100 yards even at a slow pace at a flat grade s stopping due to sob   Cough: severe fits of coughing day and noc  Sleeping: poorly due to cough and wheeze SABA use: qid instead of symbicort  rec Plan A = Automatic = Symbicort 80 Take 2 puffs first thing in am and then another 2 puffs about 12 hours later.  Plan B = Backup (add this to Plan A, not in place of it)  Only use your albuterol inhaler as a rescue medication Plan C = Crisis - only use your albuterol nebulizer if you first try Plan B For drainage / throat tickle try take CHLORPHENIRAMINE  4 mg(Chlortab at [facility]) -  Take delsym two tsp every 12 hours and supplement if needed with  vicodine up to 2 every 4 hours to suppress the urge to cough. Swallowing water and/or using ice chips/non mint and menthol containing candies (such as lifesavers or  sugarless jolly ranchers) are also effective.  You should rest your voice and avoid activities that you know make you cough. Once you have eliminated the cough for 3 straight days try reducing the vicodin first,  then the delsym as tolerated.   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 We discussed treatment options including observation, continuing PPI therapy, an evaluation in the voice lab to assess her candidacy for voice therapy and respiratory retraining, or a trial of tramadol.  04/16/2019 acute extended ov/ Malikye Reppond re recurrent cough on gabapentin 300 tid / did not fill tramadol "I told Dr Joya Gaskins it wouldn't work" "only better with oxycodone"  Chief Complaint  Patient presents with  . Acute Visit    Increased cough x 3 wks- prod with white sputum. She has had some wheezing also. She is using her albuterol inhaler at least once per day and rarely uses neb.  Dyspnea:  Minimal  Cough: worse tickle/ no h1 on hand says plans to pick it up  Sleeping: fine 2 pillows / not typically waking her up  SABA use: rarely  02: none  Doesn't think delsym helped/ no h1 on hand / using gabapentin 300 mg tid   gen chest discomfort with severe coughing fits  rec Prednisone 10 mg take  4 each am x 2 days,   2 each am x 2 days,  1 each am x 2 days and stop  Take delsym two tsp every 12 hours and supplement if needed with oxidocone 5 mg up to 2 every 4 hours to suppress the urge to cough.   Once you have eliminated the cough for 3 straight days try reducing the oxycodone first,  then the delsym as tolerated.   Keep your appts with speech therapy  Please schedule a follow up visit in  3 months but call sooner if needed  with all medications /inhalers/ solutions in hand so we can verify exactly what you are taking. This includes all medications from all doctors and over the counters     11/21/2020  f/u ov/Lorenz Donley re:  GOLD II / breztri better than symbicort  Chief Complaint  Patient presents with  . Follow-up    She states she has alot of SOB when she wakes up in the morning. She rarely uses her albuterol inhaler, but has been using neb almost every night.   Dyspnea:  Gets up around 630 am does better after taking am breztri   Cough: sporadic but dry  / less need for tramadol on gabapentin usually just taking 300 bid  Sleeping: fine 2 pillows  SABA use: every other night sev hours p supper  feels like needs nebulizer even p pm dose of breztri 02: none rec Change gabapentin to 300 mg 4 x daily > done  Change pepcid 20 mg after supper > done  Only use your albuterol as a rescue medication >  Cut down to neb every 3 days  Bed blocks are strongly recommended    Virtual Visit via Telephone Note 12/28/2020   I connected with [name] on 12/28/20 at 10:15 AM EST by telephone and verified that I am speaking with the correct person using two identifiers. Pt is at home and this call made from my office with no other participants    I discussed the limitations, risks, security and privacy concerns of performing an evaluation and management service by telephone and the availability of in person appointments. I also discussed with the patient that there may be a patient responsible charge related to this service. The patient expressed understanding and agreed to proceed.   History of Present Illness:Acute visit Acute onset cough onset 12/22 worse than usual/ mucus thick slt yellow/assoc sob/took her breath away/neb helped so used  Dyspnea:  Denies now any problem with breathing. Cough: using delsym and tramadol and ran out 3 days prior to OV   Sleeping: props up no bed  blocks  SABA use:not needing any  02:  none  Covid 19  neg 12/28/20 so got booster   No obvious day to day or daytime variability or assoc  hemoptysis or cp or chest tightness, subjective wheeze or overt sinus or hb symptoms.    Also denies any obvious fluctuation of symptoms with weather or environmental changes or other aggravating or alleviating factors except as outlined above.   Meds reviewed/ med reconciliation completed     Current Meds  Medication Sig  . Accu-Chek FastClix Lancets MISC Inject 1 each as directed daily. Use as directed to check blood sugars daily dx: e11.22  . albuterol (VENTOLIN HFA) 108 (90 Base) MCG/ACT inhaler INHALE 2 PUFFS INTO THE LUNGS EVERY 4 HOURS AS NEEDED FOR WHEEZING OR SHORTNESS OF BREATH  . aspirin 81 MG tablet Take 81 mg by mouth daily.  . Biotin 10000 MCG TBDP Take by mouth.  . bisoprolol (ZEBETA) 5 MG tablet TAKE 1/2 TABLET(2.5 MG) BY MOUTH DAILY  . Budeson-Glycopyrrol-Formoterol (BREZTRI AEROSPHERE) 160-9-4.8 MCG/ACT AERO Inhale 2 puffs into the lungs 2 (two) times daily.  Marland Kitchen CALCIUM-MAGNESIUM-ZINC PO Take 1 capsule by mouth daily.  . Cholecalciferol (VITAMIN D3) 2000 UNITS TABS Take 2,000 Units by mouth daily.  . famotidine (PEPCID) 20 MG tablet One after supper  . fluticasone (FLONASE) 50 MCG/ACT nasal spray Place 1 spray into both nostrils daily as needed for allergies or rhinitis.  Marland Kitchen gabapentin (NEURONTIN) 300 MG capsule Take 1 capsule (300 mg total) by mouth 4 (four) times daily.  Marland Kitchen glucose blood (ACCU-CHEK GUIDE) test strip 1 each by Other route daily. Use as instructed to check blood sugars 1 time per day dx: e11.22  . ipratropium-albuterol (DUONEB) 0.5-2.5 (3) MG/3ML SOLN TAKE 3 MILLILITERS BY NEBULIZATION EVERY 6 HOURS AS NEEDED  . lidocaine (LIDODERM) 5 % Place 3 patches onto the skin daily. Remove & Discard patch within 12 hours or as directed by MD  . Magnesium 250 MG TABS Take 1 tablet by mouth daily.  . montelukast (SINGULAIR) 10 MG  tablet TAKE 1 TABLET(10 MG) BY MOUTH AT BEDTIME  . pantoprazole (PROTONIX) 40 MG tablet Take 56min before the first meal of the day.  . rosuvastatin (CRESTOR) 20 MG tablet TAKE 1 TABLET(20 MG) BY MOUTH AT BEDTIME  . Semaglutide,0.25 or 0.5MG /DOS, (OZEMPIC, 0.25 OR 0.5 MG/DOSE,) 2 MG/1.5ML SOPN Inject 0.5 mg into the skin once a week. (Patient taking differently: Inject 1 mg into the skin once a week.)         Observations/Objective: slt hoarse, harsh cough/min rattling, no conversational sob   Assessment and Plan: See problem list for active a/p's   Follow Up Instructions: See avs for instructions unique to this ov which includes revised/ updated med list     I discussed the assessment and treatment plan with the patient. The patient was provided an opportunity to ask questions and all were answered. The patient agreed with the plan and demonstrated an understanding of the instructions.   The patient was advised to call back or seek an in-person evaluation if the symptoms worsen or if the condition fails to improve as anticipated.  I provided 30 minutes of non-face-to-face time during this encounter.   [name], MD

## 2020-12-28 NOTE — Patient Instructions (Signed)
Visit Information  Goals Addressed              This Visit's Progress   .  Pharmacy Care Plan (pt-stated)        CARE PLAN ENTRY (see longitudinal plan of care for additional care plan information)  Current Barriers:  . Chronic Disease Management support, education, and care coordination needs related to Hypertension, Hyperlipidemia, and Diabetes   Hypertension BP Readings from Last 3 Encounters:  11/21/20 130/76  10/16/20 118/70  09/06/20 122/80   . Pharmacist Clinical Goal(s): o Over the next 90 days, patient will work with PharmD and providers to maintain BP goal <130/80 . Current regimen:  o Hydrochlorothiazide 25 mg tablet - take daily  o Bisoprolol 5 mg - take 1/2 tablet daily  . Interventions: o Patient to check BP at home  o Discussed the importance of a healthy diet  . Patient self care activities - Over the next 90 days, patient will: o Check BP 1-3 times per week, document, and provide at future appointments o Ensure daily salt intake < 2300 mg/day  Hyperlipidemia Lab Results  Component Value Date/Time   LDLCALC 78 05/04/2020 11:38 AM   . Pharmacist Clinical Goal(s): o Over the next 90 days, patient will work with PharmD and providers to achieve LDL goal < 70 . Current regimen:  o Rosuvastatin 20 mg-take daily . Interventions: o Discussed the importance of healthy eating o The importance of medication adherence . Patient self care activities - Over the next 90 days, patient will: o Patient to reduce the amount of high fat food they eat.  o Try to do some exercise at least 30 minutes per day for 1-3 days.   Diabetes Lab Results  Component Value Date/Time   HGBA1C 6.3 (H) 09/06/2020 02:25 PM   HGBA1C 6.2 (H) 05/04/2020 11:38 AM   . Pharmacist Clinical Goal(s): o Over the next 90 days, patient will work with PharmD and providers to maintain A1c goal <7% . Current regimen:  o Ozempic 0.5 MG/Dose- Inject 0.5 mg in to the skin once  a week   . Interventions: o Patient given samples of Ozempic o Discussed proper injection technique of medication o Helped patient with patient assistance enrollment forms for 2021 . Patient self care activities - Over the next 90 days, patient will: o Check blood sugar twice daily, document, and provide at future appointments o Contact provider with any episodes of hypoglycemia  Medication management . Pharmacist Clinical Goal(s): o Over the next 90 days, patient will work with PharmD and providers to maintain optimal medication adherence . Current pharmacy: [facility] [phone]  . Interventions o Comprehensive medication review performed. o Continue current medication management strategy . Patient self care activities - Over the next 90 days, patient will: o Focus on medication adherence by taking medication on a consistent schedule  o Take medications as prescribed o Report any questions or concerns to PharmD and/or provider(s)  Please see past updates related to this goal by clicking on the "Past Updates" button in the selected goal         The patient verbalized understanding of instructions, educational materials, and care plan provided today and agreed to receive a mailed copy of patient instructions, educational materials, and care plan.   The pharmacy team will reach out to the patient again over the next 30 days.   [name], PharmD Clinical Pharmacist [facility] [phone]

## 2020-12-28 NOTE — Telephone Encounter (Signed)
Last email mentioned doing televisit if her covid test was neg:  Tanda Rockers, MD     4:34 PM Note Sorry to hear that - nothing else to offer other than be evaluated in UC or ER where covid can be checked as I see she hasn't had booster per our records and even if she had she would need this checked before going any further.  If this is negative then can do televisit - it it's positive will need refer to monoclonal antibody center     Called and spoke with the pt and notified that she needs televisit and this was scheduled for today at 10:15 am.

## 2020-12-28 NOTE — Chronic Care Management (AMB) (Addendum)
Chronic Care Management Pharmacy  Name: ANNETT Horton  MRN: [id number] DOB: April 28, 1946  Chief Complaint/ HPI  [name],  74 y.o. , female presents for their Follow-Up CCM visit with the clinical pharmacist via telephone due to COVID-19 Pandemic.  PCP : [name], MD  Their chronic conditions include: Hypertension, COPD with chronic cough, diabetes, and high cholesterol  Office Visits: 07/19/20 OV: Presented with complaints of facial pain, sensitive to touch for the last 5 days. Start tramadol 44m every 6 hours as needed for possible trigeminal neuralgia.   07/18/20 Patient message: Pt complained of facial pain and cough. Requesting medication before leaving for vacation. Advised she would need to be evaluated before medications were prescribed.   06/29/20 Telephone call: Pt advised Ozempic arrived at office today from [employer] program. Pt called back and stated what she picked up was 0.535mnot the 70m23mhe has been using  06/14/20 OV: Presented stating she has been wheezing and having trouble breathing for the past 2 weeks. Has a productive cough. Pulmonologist was unable to see pt. Has used inhalers and nebulizer with no relief. Started on prednisone taper and administered Kenalog 20m48mot in office. Encouraged to use nebulizer ever 6 hours as needed.   05/04/20 OV: Labs ordered (HgbA1c, Lipid panel, CMP14+EGFR). Rx for Shingrix sent to local pharmacy. Advised to wait 90 days after second COVID shot to start Shingrix series. Kidney function and A1c improved. Advised to resume walking once able to get LDL down to below 70.  01/04/20: OV with Dr. SandBaird Canceresented for DM check. HgbA1c 6.6% , patient assistance for Ozempic completed. BMP8+EGFR ordered (stable). Vitamin B12 and TSH ordered relating to right leg paresthesias (results w/in normal range), sees Ortho. Rx for Prevnar13 sent to Pharmacy. Follow up in 4 months for re-evaluation.  Consult  Visit: 07/18/20 Patient message: Requested refill for oxycodone or tramadol for cough, but advised she would need to make an appointment in order to get a refill. NP states she would not prescribe either medication for cough. Pt declined to make an appointment at this time.   Patient reported she had surgery on her left shoulder last week (week of April 18th) to repair tendon tears and bone spurs that were causing significant pain. Will wear sling for 6 weeks and perform physical therapy for 3 months. Methocarbamol, oxycodone, acetaminophen, and celecoxib given after surgery. At follow up visit with surgery yesterday patient started on cyclobenzaprine and ketorolac and told to stop celecoxib until she finished 3 days of ketorolac.   02/07/20- CCM Encounter with Dr. PruiBlanca Friendtient assistance for Symbicort application started. Comprehensive medication review.  01/17/20- CCM Encounter with Dr. PruiBlanca Friendtient assistance for Ozempic submitted on 01/12/20. Comprehensive medication review.   12/16/19: Pulmonology Virtual visit with Dr. WertMelvyn Novasronic cough addressed. Reflux, asthma, or post nasal drip syndrome could contribute to the cycle of cough. Prednisone taper, ZPak, Mucinex DM and oxycodone 5mg 32men. Gabapentin also increased to 300mg 30m times daily. Patient instructed to stop albuterol HFA and just use Duoneb solution.   12/08/19: CCM encounter with Dr. PruittBlanca Friendrehensive medication review  Medications: Outpatient Encounter Medications as of 12/20/2020  Medication Sig  . Accu-Chek FastClix Lancets MISC Inject 1 each as directed daily. Use as directed to check blood sugars daily dx: e11.22  . albuterol (VENTOLIN HFA) 108 (90 Base) MCG/ACT inhaler INHALE 2 PUFFS INTO THE LUNGS EVERY 4 HOURS AS NEEDED FOR WHEEZING OR SHORTNESS OF BREATH  . aspirin  81 MG tablet Take 81 mg by mouth daily.  . Biotin 10000 MCG TBDP Take by mouth.  . bisoprolol (ZEBETA) 5 MG tablet TAKE 1/2 TABLET(2.5 MG) BY MOUTH  DAILY  . Budeson-Glycopyrrol-Formoterol (BREZTRI AEROSPHERE) 160-9-4.8 MCG/ACT AERO Inhale 2 puffs into the lungs 2 (two) times daily.  Marland Kitchen CALCIUM-MAGNESIUM-ZINC PO Take 1 capsule by mouth daily.  . Cholecalciferol (VITAMIN D3) 2000 UNITS TABS Take 2,000 Units by mouth daily.  . famotidine (PEPCID) 20 MG tablet One after supper  . fluticasone (FLONASE) 50 MCG/ACT nasal spray Place 1 spray into both nostrils daily as needed for allergies or rhinitis.  Marland Kitchen gabapentin (NEURONTIN) 300 MG capsule Take 1 capsule (300 mg total) by mouth 4 (four) times daily.  Marland Kitchen glucose blood (ACCU-CHEK GUIDE) test strip 1 each by Other route daily. Use as instructed to check blood sugars 1 time per day dx: e11.22  . ipratropium-albuterol (DUONEB) 0.5-2.5 (3) MG/3ML SOLN TAKE 3 MILLILITERS BY NEBULIZATION EVERY 6 HOURS AS NEEDED  . lidocaine (LIDODERM) 5 % Place 3 patches onto the skin daily. Remove & Discard patch within 12 hours or as directed by MD  . Magnesium 250 MG TABS Take 1 tablet by mouth daily.  . montelukast (SINGULAIR) 10 MG tablet TAKE 1 TABLET(10 MG) BY MOUTH AT BEDTIME  . pantoprazole (PROTONIX) 40 MG tablet Take 7mn before the first meal of the day.  . rosuvastatin (CRESTOR) 20 MG tablet TAKE 1 TABLET(20 MG) BY MOUTH AT BEDTIME  . Semaglutide,0.25 or 0.5MG/DOS, (OZEMPIC, 0.25 OR 0.5 MG/DOSE,) 2 MG/1.5ML SOPN Inject 0.5 mg into the skin once a week. (Patient taking differently: Inject 1 mg into the skin once a week.)  . [DISCONTINUED] traMADol (ULTRAM) 50 MG tablet Take 1 tablet (50 mg total) by mouth every 6 (six) hours as needed. (Patient not taking: Reported on 12/28/2020)   No facility-administered encounter medications on file as of 12/20/2020.   Current Diagnosis/Assessment:  Goals Addressed              This Visit's Progress   .  Pharmacy Care Plan (pt-stated)        CARE PLAN ENTRY (see longitudinal plan of care for additional care plan information)  Current Barriers:  . Chronic  Disease Management support, education, and care coordination needs related to Hypertension, Hyperlipidemia, and Diabetes   Hypertension BP Readings from Last 3 Encounters:  11/21/20 130/76  10/16/20 118/70  09/06/20 122/80   . Pharmacist Clinical Goal(s): o Over the next 90 days, patient will work with PharmD and providers to maintain BP goal <130/80 . Current regimen:  o Hydrochlorothiazide 25 mg tablet - take daily  o Bisoprolol 5 mg - take 1/2 tablet daily  . Interventions: o Patient to check BP at home  o Discussed the importance of a healthy diet  . Patient self care activities - Over the next 90 days, patient will: o Check BP 1-3 times per week, document, and provide at future appointments o Ensure daily salt intake < 2300 mg/day  Hyperlipidemia Lab Results  Component Value Date/Time   LDLCALC 78 05/04/2020 11:38 AM   . Pharmacist Clinical Goal(s): o Over the next 90 days, patient will work with PharmD and providers to achieve LDL goal < 70 . Current regimen:  o Rosuvastatin 20 mg-take daily . Interventions: o Discussed the importance of healthy eating o The importance of medication adherence . Patient self care activities - Over the next 90 days, patient will: o Patient to reduce the amount of  high fat food they eat.  o Try to do some exercise at least 30 minutes per day for 1-3 days.   Diabetes Lab Results  Component Value Date/Time   HGBA1C 6.3 (H) 09/06/2020 02:25 PM   HGBA1C 6.2 (H) 05/04/2020 11:38 AM   . Pharmacist Clinical Goal(s): o Over the next 90 days, patient will work with PharmD and providers to maintain A1c goal <7% . Current regimen:  o Ozempic 0.5 MG/Dose- Inject 0.5 mg in to the skin once  a week  . Interventions: o Patient given samples of Ozempic o Discussed proper injection technique of medication o Helped patient with patient assistance enrollment forms for 2021 . Patient self care activities - Over the next 90 days, patient  will: o Check blood sugar twice daily, document, and provide at future appointments o Contact provider with any episodes of hypoglycemia  Medication management . Pharmacist Clinical Goal(s): o Over the next 90 days, patient will work with PharmD and providers to maintain optimal medication adherence . Current pharmacy: [facility] [phone]  . Interventions o Comprehensive medication review performed. o Continue current medication management strategy . Patient self care activities - Over the next 90 days, patient will: o Focus on medication adherence by taking medication on a consistent schedule  o Take medications as prescribed o Report any questions or concerns to PharmD and/or provider(s)  Please see past updates related to this goal by clicking on the "Past Updates" button in the selected goal         COPD with chronic cough   Last spirometry score: FEV1 1.1 (58% predicted) , FVC 2.2, FEV/FVC 52% on 04/14/17  Gold Grade: Gold 2 (FEV1 50-79%)  Eosinophil count:   Lab Results  Component Value Date/Time   EOSPCT 1.2 10/16/2020 11:01 AM                                 Eos (Absolute):  Lab Results  Component Value Date/Time   EOSABS 0.1 10/16/2020 11:01 AM   Tobacco Status:  Social History   Tobacco Use  Smoking Status Former Smoker  . Packs/day: 0.25  . Years: 42.00  . Pack years: 10.50  . Types: Cigarettes  . Quit date: 01/30/2006  . Years since quitting: 14.9  Smokeless Tobacco Never Used   Patient has failed these meds in past: Breo, Dulera, Spiriva, Symbicort  Patient is currently controlled on the following medications:   Breztri - 2 inhalations twice daily  Ventolin 2 puffs into lungs every 4 hours as needed for wheezing/ SOB  Duoneb 1 vial every 6 hours prn  Montelukast 38m daily  Chronic cough currently treated with the following medications:  Famotidine 237mdaily  Pantoprazole 4010maily  Gabapentin 300m62mtimes daily (patient only  taking twice daily)  Using maintenance inhaler regularly? Yes Frequency of rescue inhaler use:  infrequently; patient reports she has not had to use Ventolin or Duoneb since January  We discussed:   Importance of compliance with maintenance inhaler; patient reports 100% compliance  Patient was given samples of Breztri, per Dr. WertGustavus Bryantice patient assistance has been faxed.   Plan Continue current medications ,  Diabetes   Recent Relevant Labs: Lab Results  Component Value Date/Time   HGBA1C 6.3 (H) 09/06/2020 02:25 PM   HGBA1C 6.2 (H) 05/04/2020 11:38 AM   MICROALBUR 10 09/06/2020 12:54 PM   MICROALBUR 10 09/01/2019 02:42 PM  Kidney Function Lab Results  Component Value Date   CREATININE 0.76 09/06/2020   CREATININE 0.76 05/04/2020   CREATININE 0.90 01/04/2020      Component Value Date/Time   GFRNONAA 79 09/06/2020 1429   GFRAA 91 09/06/2020 1429      Checking BG: Rarely  Patient has failed these meds in past: Victoza Patient is currently controlled on the following medications:   Ozempic $Remove'1mg'qoYbXVT$  weekly  Last diabetic Foot exam: 09/01/19 Last diabetic Eye exam: 10/19/19 Lab Results  Component Value Date/Time   HMDIABEYEEXA No Retinopathy 12/04/2020 12:00 AM    We discussed:  Checked BG for about a week when having facial problems and reports that it was not high (90s) Diet and exercise extensively  Does not eat breakfast (just a cup of coffee)  Does not often eat lunch  Sometimes snacks on fruit, tomato sandwich  Meat and vegetables for dinner  Pt drinks about a half gallon of water daily  Doing physical therapy twice weekly for shoulder (helping with shoulder pain)  Encouraged patient to resume exercising as soon as possible post-surgery with a goal of 150 minutes per week.   Discussed goal 30 minutes 5 times weekly  Patient was given sample of Ozempic today.   Patient wanted to inject herself with the medication twice without removing the pen  from her skin  I explained that this was not advised and conferred with [place] pharmacist which agreed.   Pt voiced understanding  Advised patient that this is the last sample she will be given. She will have to pay for the medication moving forward until approval is received from the company for patient assistance.   Ms. Barrie was in agreement and reported that she will pay for the medication moving forward  Plan Continue current medications    Hypertension   Office blood pressures are  BP Readings from Last 3 Encounters:  11/21/20 130/76  10/16/20 118/70  09/06/20 122/80   Patient has failed these meds in the past: Lisinopril, losartan, Bystolic -ACEI/ARB contraindicated/not tolerated in this patient due to history of chronic cough  Patient checks BP at home Never, does not have a meter at home to check with  Patient is currently controlled on the following medications:   Bisoprolol $RemoveBef'5mg'bfbnVimCDp$  1/2 tablet daily  We discussed  Diet and exercise extensively  Recommend check BP periodically  Pt does not have BP meter at home and does not want to purchase one right now  Advised that she can obtain through insurance, but there will be a small fee  Plan Continue current medications    High Cholesterol   Lipid Panel     Component Value Date/Time   CHOL 146 05/04/2020 1138   TRIG 119 05/04/2020 1138   HDL 47 05/04/2020 1138   CHOLHDL 3.1 05/04/2020 1138   [place] 78 05/04/2020 1138   LABVLDL 21 05/04/2020 1138    The ASCVD Risk score (Goff DC Jr., et al., 2013) failed to calculate for the following reasons:   Unable to determine if patient is Non-Hispanic African American   Patient has failed these meds in past: N/A Patient is currently uncontrolled on the following medications:   Rosuvastatin $RemoveBefor'20mg'HAfdLOfNIfPt$  daily  Aspirin $Remove'81mg'Yjmxynm$    We discussed:   Diet and exercise extensively  Increasing exercise as recommended could help pt reach cholesterol  goal  Plan Continue current medications  CPA to do a general assessment with patient and discuss medication adherence.  Chronic Kidney Disease Stage 2  Lab Results  Component Value Date   CREATININE 0.76 09/06/2020   BUN 5 (L) 09/06/2020   GFRNONAA 79 09/06/2020   GFRAA 91 09/06/2020   NA 141 09/06/2020   K 4.4 09/06/2020   CALCIUM 9.6 09/06/2020   CO2 28 09/06/2020     Plan Patients medications were reviewed and no changes are needed at this time with patients medications. Will closely monitor patients kidney function and adjust medications as necessary.   Health Maintenance   Patient is currently  on the following medications:   Biotin 10,063mg daily for her nails (when she has it)  Calcium 10059mMag 40013minc 79m73m Vitamin D 79mc54mily  Cholecalciferol 2000 units daily  Fiber gummy (4g) twice daily  We discussed:    Patient is now taking a fiber supplement daily to help with weight management  Plan Continue current medications  Vaccines   Reviewed and discussed patient's vaccination history.    Immunization History  Administered Date(s) Administered  . Fluad Quad(high Dose 65+) 09/06/2020  . Influenza Split 09/29/2013, 09/30/2015  . Influenza Whole 08/30/2016  . Influenza, High Dose Seasonal PF 09/29/2017, 09/14/2018, 09/01/2019  . Influenza,inj,Quad PF,6+ Mos 09/12/2014  . Influenza-Unspecified 09/14/2018  . PFIZER SARS-COV-2 Vaccination 01/19/2020, 02/09/2020  . Pneumococcal Conjugate-13 01/06/2020  . Pneumococcal Polysaccharide-23 10/07/2013  . Pneumococcal-Unspecified 10/05/2013  . Tdap 06/07/2019, 06/09/2019   Patient received Prevnar at pharmacy 01/06/20  We discussed:  Patient has received both doses of the shingrix vaccine.  Plan Recommended patient receive Shingrix vaccine series at pharmacy.   Medication Management   [facility] Uses pill box? Yes Pt endorses 100% compliance with maintenance  medications  We discussed the importance of medication adherence.  Plan Continue current medication management strategy  Follow up: 3 month phone visit  ValliOrlando PennerrmD Clinical Pharmacist [facility] [phone]

## 2020-12-28 NOTE — Assessment & Plan Note (Signed)
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 - try off cozar 11/03/2014 >>> - neurontin 100 tid rx 11/30/14 > improved 01/03/2015 > changed to lyrica 04/2015 but not taking consistently as of 01/30/2016  - sinus CT 02/17/15 > neg  - FENO  04/14/17 =  14  - rechallenged with gabapentin 300 qid 07/22/2018 and if not better > refer to Dr Joya Gaskins at [facility] voice center > did not go, referred again 01/07/2019 > see eval 03/02/2019 rec speech therapy/tramadol (pt refused latter)   - flare early Sep 2021 off gabapentin and on breztri > changed back to gabapentin / symb 80 2bid > preferred breztri  - 11/21/2020 rec increase gabapentin to 300 qid to eliminate need for tramadol  - 12/28/2020 acute breakthru despite following instructions x for bed blocks > rx oxy 5 mg q 4 hr prn  Of the three most common causes of  Sub-acute / recurrent or chronic cough, only one (GERD)  can actually contribute to/ trigger  the other two (asthma and post nasal drip syndrome)  and perpetuate the cylce of cough.  While not intuitively obvious, many patients with chronic low grade reflux do not cough until there is a primary insult that disturbs the protective epithelial barrier and exposes sensitive nerve endings.   This is typically viral but can due to PNDS and  Former probably applies here.   The point is that once this occurs, it is difficult to eliminate the cycle  using anything but a maximally effective acid suppression regimen at least in the short run, accompanied by an appropriate diet to address non acid GERD and control / eliminate the cough itself for at least 3 days with oxcodone as not responding to tramadol and also added 6 days of Prednisone in case of component of Th-2 driven upper or lower airways inflammation (if cough responds short term only to relapse befor return while will on rx for uacs that would point to allergic rhinitis/  asthma or eos bronchitis)     Each maintenance medication was reviewed in detail including most importantly the difference between maintenance and as needed and under what circumstances the prns are to be used.  Please see AVS for specific  Instructions which are unique to this visit and I personally typed out  which were reviewed in detail over the phone with the patient and a copy provided via my chart

## 2020-12-28 NOTE — Patient Instructions (Addendum)
Plan A = Automatic = Always=   Continue Breztri Take 2 puffs first thing in am and then another 2 puffs about 12 hours later.     Plan B = Backup (to supplement plan A, not to replace it) Only use your albuterol inhaler as a rescue medication to be used if you can't catch your breath by resting or doing a relaxed purse lip breathing pattern.  - The less you use it, the better it will work when you need it. - Ok to use the inhaler up to 2 puffs  every 4 hours if you must but call for appointment if use goes up over your usual need - Don't leave home without it !!  (think of it like the spare tire for your car)   Plan C = Crisis (instead of Plan B but only if Plan B stops working) - only use your albuterol nebulizer if you first try Plan B and it fails to help > ok to use the nebulizer up to every 4 hours but if start needing it regularly call for immediate appointment   For cough >    mucinex dm  1200 mg every 12 hours and supplement with oxycodone 5 mg every 4 hours if needed   Prednisone 10 mg take  4 each am x 2 days,   2 each am x 2 days,  1 each am x 2 days and stop   zpak take as directed    Please schedule a follow up visit in 3 months but call sooner if needed - we will need to get you a flutter valve on return

## 2020-12-28 NOTE — Assessment & Plan Note (Signed)
Quit smoking 2007 PFT: 03/17/2014-  FEV1  1.28 (58%) ratio 66 and no better p saba, ERV 27 and DLCO 56 corrects to 77 - 11/30/2014   try dulera 100 2bid > 01/03/2015 not clear helping,  use prn > flare since mid 02/2015 cough > sob  - 04/07/2015 p extensive coaching HFA effectiveness =    90% > try dulera 100 2bid > improved 04/21/15   - 06/01/2015   try dulera 200 2bid  > did not maintain  - Spirometry 04/14/2017  FEV1 1.13 (58%)  Ratio 52 p saba w/in 4 h > add symb 80 2bid   - 08/19/2017    resumed sym 80 2bid -  03/23/2018    try symb 160 due to new noct cough/ wheeze > improved 04/20/2018  - 07/22/2018   try symb 80 2bid due to UACS - Pulmonary eval at [facility] 2/267/20 rec add spiriva smi > pt declined   aecopd in setting of viral uri/ covid 19 ruled out   Rec: Zpak/  Prednisone 10 mg take  4 each am x 2 days,   2 each am x 2 days,  1 each am x 2 days and stop  Group D in terms of symptom/risk and laba/lama/ICS  therefore appropriate rx at this point >>>  Continue breztri and approp saba prn sob I spent extra time with pt today reviewing appropriate use of albuterol for prn use on exertion with the following points: 1) saba is for relief of sob that does not improve by walking a slower pace or resting but rather if the pt does not improve after trying this first. 2) If the pt is convinced, as many are, that saba helps recover from activity faster then it's easy to tell if this is the case by re-challenging : ie stop, take the inhaler, then p 5 minutes try the exact same activity (intensity of workload) that just caused the symptoms and see if they are substantially diminished or not after saba 3) if there is an activity that reproducibly causes the symptoms, try the saba 15 min before the activity on alternate days   If in fact the saba really does help, then fine to continue to use it prn but advised may need to look closer at the maintenance regimen being used to achieve better control of airways disease  with exertion.

## 2021-01-04 ENCOUNTER — Other Ambulatory Visit: Payer: Self-pay

## 2021-01-04 ENCOUNTER — Ambulatory Visit (INDEPENDENT_AMBULATORY_CARE_PROVIDER_SITE_OTHER): Payer: Medicare Other | Admitting: Internal Medicine

## 2021-01-04 ENCOUNTER — Encounter: Payer: Self-pay | Admitting: Internal Medicine

## 2021-01-04 VITALS — BP 118/68 | HR 66 | Temp 98.3°F | Ht 66.0 in | Wt 190.8 lb

## 2021-01-04 DIAGNOSIS — I10 Essential (primary) hypertension: Secondary | ICD-10-CM | POA: Diagnosis not present

## 2021-01-04 DIAGNOSIS — I700 Atherosclerosis of aorta: Secondary | ICD-10-CM | POA: Diagnosis not present

## 2021-01-04 DIAGNOSIS — J449 Chronic obstructive pulmonary disease, unspecified: Secondary | ICD-10-CM | POA: Diagnosis not present

## 2021-01-04 DIAGNOSIS — E1165 Type 2 diabetes mellitus with hyperglycemia: Secondary | ICD-10-CM | POA: Diagnosis not present

## 2021-01-04 MED ORDER — OZEMPIC (1 MG/DOSE) 4 MG/3ML ~~LOC~~ SOPN: 1.0000 mg | PEN_INJECTOR | SUBCUTANEOUS | 3 refills | Status: DC

## 2021-01-04 NOTE — Patient Instructions (Signed)
Hypertension, Adult Hypertension is another name for high blood pressure. High blood pressure forces your heart to work harder to pump blood. This can cause problems over time. There are two numbers in a blood pressure reading. There is a top number (systolic) over a bottom number (diastolic). It is best to have a blood pressure that is below 120/80. Healthy choices can help lower your blood pressure, or you may need medicine to help lower it. What are the causes? The cause of this condition is not known. Some conditions may be related to high blood pressure. What increases the risk?  Smoking.  Having type 2 diabetes mellitus, high cholesterol, or both.  Not getting enough exercise or physical activity.  Being overweight.  Having too much fat, sugar, calories, or salt (sodium) in your diet.  Drinking too much alcohol.  Having long-term (chronic) kidney disease.  Having a family history of high blood pressure.  Age. Risk increases with age.  Race. You may be at higher risk if you are African American.  Gender. Men are at higher risk than women before age 60. After age 83, women are at higher risk than men.  Having obstructive sleep apnea.  Stress. What are the signs or symptoms?  High blood pressure may not cause symptoms. Very high blood pressure (hypertensive crisis) may cause: ? Headache. ? Feelings of worry or nervousness (anxiety). ? Shortness of breath. ? Nosebleed. ? A feeling of being sick to your stomach (nausea). ? Throwing up (vomiting). ? Changes in how you see. ? Very bad chest pain. ? Seizures. How is this treated?  This condition is treated by making healthy lifestyle changes, such as: ? Eating healthy foods. ? Exercising more. ? Drinking less alcohol.  Your health care provider may prescribe medicine if lifestyle changes are not enough to get your blood pressure under control, and if: ? Your top number is above 130. ? Your bottom number is above  80.  Your personal target blood pressure may vary. Follow these instructions at home: Eating and drinking   If told, follow the DASH eating plan. To follow this plan: ? Fill one half of your plate at each meal with fruits and vegetables. ? Fill one fourth of your plate at each meal with whole grains. Whole grains include whole-wheat pasta, brown rice, and whole-grain bread. ? Eat or drink low-fat dairy products, such as skim milk or low-fat yogurt. ? Fill one fourth of your plate at each meal with low-fat (lean) proteins. Low-fat proteins include fish, chicken without skin, eggs, beans, and tofu. ? Avoid fatty meat, cured and processed meat, or chicken with skin. ? Avoid pre-made or processed food.  Eat less than 1,500 mg of salt each day.  Do not drink alcohol if: ? Your doctor tells you not to drink. ? You are pregnant, may be pregnant, or are planning to become pregnant.  If you drink alcohol: ? Limit how much you use to:  0-1 drink a day for women.  0-2 drinks a day for men. ? Be aware of how much alcohol is in your drink. In the [place] one drink equals one 12 oz bottle of beer (355 mL), one 5 oz glass of wine (148 mL), or one 1 oz glass of hard liquor (44 mL). Lifestyle   Work with your doctor to stay at a healthy weight or to lose weight. Ask your doctor what the best weight is for you.  Get at least 30 minutes of exercise most  days of the week. This may include walking, swimming, or biking.  Get at least 30 minutes of exercise that strengthens your muscles (resistance exercise) at least 3 days a week. This may include lifting weights or doing Pilates.  Do not use any products that contain nicotine or tobacco, such as cigarettes, e-cigarettes, and chewing tobacco. If you need help quitting, ask your doctor.  Check your blood pressure at home as told by your doctor.  Keep all follow-up visits as told by your doctor. This is important. Medicines  Take over-the-counter  and prescription medicines only as told by your doctor. Follow directions carefully.  Do not skip doses of blood pressure medicine. The medicine does not work as well if you skip doses. Skipping doses also puts you at risk for problems.  Ask your doctor about side effects or reactions to medicines that you should watch for. Contact a doctor if you:  Think you are having a reaction to the medicine you are taking.  Have headaches that keep coming back (recurring).  Feel dizzy.  Have swelling in your ankles.  Have trouble with your vision. Get help right away if you:  Get a very bad headache.  Start to feel mixed up (confused).  Feel weak or numb.  Feel faint.  Have very bad pain in your: ? Chest. ? Belly (abdomen).  Throw up more than once.  Have trouble breathing. Summary  Hypertension is another name for high blood pressure.  High blood pressure forces your heart to work harder to pump blood.  For most people, a normal blood pressure is less than 120/80.  Making healthy choices can help lower blood pressure. If your blood pressure does not get lower with healthy choices, you may need to take medicine. This information is not intended to replace advice given to you by your health care provider. Make sure you discuss any questions you have with your health care provider. Document Revised: 08/26/2018 Document Reviewed: 08/26/2018 Elsevier Patient Education  [place]. Diabetes Mellitus and Exercise Exercising regularly is important for your overall health, especially when you have diabetes (diabetes mellitus). Exercising is not only about losing weight. It has many other health benefits, such as increasing muscle strength and bone density and reducing body fat and stress. This leads to improved fitness, flexibility, and endurance, all of which result in better overall health. Exercise has additional benefits for people with diabetes, including:  Reducing  appetite.  Helping to lower and control blood glucose.  Lowering blood pressure.  Helping to control amounts of fatty substances (lipids) in the blood, such as cholesterol and triglycerides.  Helping the body to respond better to insulin (improving insulin sensitivity).  Reducing how much insulin the body needs.  Decreasing the risk for heart disease by: ? Lowering cholesterol and triglyceride levels. ? Increasing the levels of good cholesterol. ? Lowering blood glucose levels. What is my activity plan? Your health care provider or certified diabetes educator can help you make a plan for the type and frequency of exercise (activity plan) that works for you. Make sure that you:  Do at least 150 minutes of moderate-intensity or vigorous-intensity exercise each week. This could be brisk walking, biking, or water aerobics. ? Do stretching and strength exercises, such as yoga or weightlifting, at least 2 times a week. ? Spread out your activity over at least 3 days of the week.  Get some form of physical activity every day. ? Do not go more than 2 days  in a row without some kind of physical activity. ? Avoid being inactive for more than 30 minutes at a time. Take frequent breaks to walk or stretch.  Choose a type of exercise or activity that you enjoy, and set realistic goals.  Start slowly, and gradually increase the intensity of your exercise over time. What do I need to know about managing my diabetes?   Check your blood glucose before and after exercising. ? If your blood glucose is 240 mg/dL (13.3 mmol/L) or higher before you exercise, check your urine for ketones. If you have ketones in your urine, do not exercise until your blood glucose returns to normal. ? If your blood glucose is 100 mg/dL (5.6 mmol/L) or lower, eat a snack containing 15-20 grams of carbohydrate. Check your blood glucose 15 minutes after the snack to make sure that your level is above 100 mg/dL (5.6 mmol/L)  before you start your exercise.  Know the symptoms of low blood glucose (hypoglycemia) and how to treat it. Your risk for hypoglycemia increases during and after exercise. Common symptoms of hypoglycemia can include: ? Hunger. ? Anxiety. ? Sweating and feeling clammy. ? Confusion. ? Dizziness or feeling light-headed. ? Increased heart rate or palpitations. ? Blurry vision. ? Tingling or numbness around the mouth, lips, or tongue. ? Tremors or shakes. ? Irritability.  Keep a rapid-acting carbohydrate snack available before, during, and after exercise to help prevent or treat hypoglycemia.  Avoid injecting insulin into areas of the body that are going to be exercised. For example, avoid injecting insulin into: ? The arms, when playing tennis. ? The legs, when jogging.  Keep records of your exercise habits. Doing this can help you and your health care provider adjust your diabetes management plan as needed. Write down: ? Food that you eat before and after you exercise. ? Blood glucose levels before and after you exercise. ? The type and amount of exercise you have done. ? When your insulin is expected to peak, if you use insulin. Avoid exercising at times when your insulin is peaking.  When you start a new exercise or activity, work with your health care provider to make sure the activity is safe for you, and to adjust your insulin, medicines, or food intake as needed.  Drink plenty of water while you exercise to prevent dehydration or heat stroke. Drink enough fluid to keep your urine clear or pale yellow. Summary  Exercising regularly is important for your overall health, especially when you have diabetes (diabetes mellitus).  Exercising has many health benefits, such as increasing muscle strength and bone density and reducing body fat and stress.  Your health care provider or certified diabetes educator can help you make a plan for the type and frequency of exercise (activity plan)  that works for you.  When you start a new exercise or activity, work with your health care provider to make sure the activity is safe for you, and to adjust your insulin, medicines, or food intake as needed. This information is not intended to replace advice given to you by your health care provider. Make sure you discuss any questions you have with your health care provider. Document Revised: 07/10/2017 Document Reviewed: 05/27/2016 Elsevier Patient Education  [place].

## 2021-01-04 NOTE — Progress Notes (Signed)
Rutherford Nail as a scribe for [name], MD.,have documented all relevant documentation on the behalf of [name], MD,as directed by  [name], MD while in the presence of [name], MD. This visit occurred during the SARS-CoV-2 public health emergency.  Safety protocols were in place, including screening questions prior to the visit, additional usage of staff PPE, and extensive cleaning of exam room while observing appropriate contact time as indicated for disinfecting solutions.  Subjective:     Patient ID: [name] , female    DOB: 07/31/1946 , 75 y.o.   MRN: [id number]   Chief Complaint  Patient presents with  . Diabetes  . Hypertension    HPI  Pt is here today for diabetes and HTN f/u. She reports compliance with meds. States she has been watching Youtube exercise videos, trying to stay active.   Diabetes She presents for her follow-up diabetic visit. She has type 2 diabetes mellitus. Her disease course has been stable. There are no hypoglycemic associated symptoms. Pertinent negatives for hypoglycemia include no dizziness or headaches. Pertinent negatives for diabetes include no blurred vision, no chest pain, no fatigue, no polydipsia, no polyphagia and no polyuria. There are no hypoglycemic complications. Risk factors for coronary artery disease include diabetes mellitus, dyslipidemia, hypertension, obesity, sedentary lifestyle and post-menopausal. She has not had a previous visit with a dietitian. She participates in exercise three times a week. Her breakfast blood glucose is taken between 8-9 am. Her breakfast blood glucose range is generally 110-130 mg/dl. Eye exam is current.  Hypertension This is a chronic problem. The current episode started more than 1 year ago. The problem has been gradually improving since onset. The problem is controlled. Pertinent negatives include no blurred vision, chest pain, headaches, palpitations or shortness  of breath. The current treatment provides moderate improvement. Compliance problems include exercise.      Past Medical History:  Diagnosis Date  . COPD (chronic obstructive pulmonary disease) ([place])   . Diabetes ([place])   . High cholesterol   . Hypertension   . Pneumonia 09/10/2017     Family History  Problem Relation Age of Onset  . Heart murmur Mother   . Heart attack Father   . Diabetes Father   . Diabetes Brother      Current Outpatient Medications:  .  Accu-Chek FastClix Lancets MISC, Inject 1 each as directed daily. Use as directed to check blood sugars daily dx: e11.22, Disp: 50 each, Rfl: 11 .  albuterol (VENTOLIN HFA) 108 (90 Base) MCG/ACT inhaler, INHALE 2 PUFFS INTO THE LUNGS EVERY 4 HOURS AS NEEDED FOR WHEEZING OR SHORTNESS OF BREATH, Disp: 6.7 g, Rfl: 1 .  aspirin 81 MG tablet, Take 81 mg by mouth daily., Disp: , Rfl:  .  Biotin 10000 MCG TBDP, Take by mouth., Disp: , Rfl:  .  bisoprolol (ZEBETA) 5 MG tablet, TAKE 1/2 TABLET(2.5 MG) BY MOUTH DAILY, Disp: 45 tablet, Rfl: 3 .  Budeson-Glycopyrrol-Formoterol (BREZTRI AEROSPHERE) 160-9-4.8 MCG/ACT AERO, Inhale 2 puffs into the lungs 2 (two) times daily., Disp: 10.7 g, Rfl: 0 .  CALCIUM-MAGNESIUM-ZINC PO, Take 1 capsule by mouth daily., Disp: , Rfl:  .  Cholecalciferol (VITAMIN D3) 2000 UNITS TABS, Take 2,000 Units by mouth daily., Disp: , Rfl:  .  famotidine (PEPCID) 20 MG tablet, One after supper, Disp: 90 tablet, Rfl: 3 .  gabapentin (NEURONTIN) 300 MG capsule, Take 1 capsule (300 mg total) by mouth 4 (four) times daily., Disp:  120 capsule, Rfl: 2 .  glucose blood (ACCU-CHEK GUIDE) test strip, 1 each by Other route daily. Use as instructed to check blood sugars 1 time per day dx: e11.22, Disp: 50 each, Rfl: 11 .  ipratropium-albuterol (DUONEB) 0.5-2.5 (3) MG/3ML SOLN, TAKE 3 MILLILITERS BY NEBULIZATION EVERY 6 HOURS AS NEEDED, Disp: 180 mL, Rfl: 2 .  lidocaine (LIDODERM) 5 %, Place 3 patches onto the skin daily. Remove &  Discard patch within 12 hours or as directed by MD, Disp: 90 patch, Rfl: 1 .  Magnesium 250 MG TABS, Take 1 tablet by mouth daily., Disp: , Rfl:  .  montelukast (SINGULAIR) 10 MG tablet, TAKE 1 TABLET(10 MG) BY MOUTH AT BEDTIME, Disp: 30 tablet, Rfl: 11 .  rosuvastatin (CRESTOR) 20 MG tablet, TAKE 1 TABLET(20 MG) BY MOUTH AT BEDTIME, Disp: 90 tablet, Rfl: 1 .  Semaglutide, 1 MG/DOSE, (OZEMPIC, 1 MG/DOSE,) 4 MG/3ML SOPN, Inject 1 mg into the skin once a week., Disp: 3 mL, Rfl: 3 .  pantoprazole (PROTONIX) 40 MG tablet, TAKE 1 TABLET BY MOUTH 30 MINS BEFORE FIRST MEAL OF THE DAY, Disp: 90 tablet, Rfl: 2   Allergies  Allergen Reactions  . Other Itching    HAIR DYE     Review of Systems  Constitutional: Negative.  Negative for fatigue.  HENT: Negative.   Eyes: Negative for blurred vision.  Respiratory: Negative.  Negative for shortness of breath.   Cardiovascular: Negative.  Negative for chest pain and palpitations.  Gastrointestinal: Negative.   Endocrine: Negative for polydipsia, polyphagia and polyuria.  Musculoskeletal: Negative.   Skin: Negative.   Neurological: Negative.  Negative for dizziness and headaches.  Psychiatric/Behavioral: Negative.      Today's Vitals   01/04/21 1411  BP: 118/68  Pulse: 66  Temp: 98.3 F (36.8 C)  TempSrc: Oral  Weight: 190 lb 12.8 oz (86.5 kg)  Height: '5\' 6"'  (1.676 m)  PainSc: 0-No pain   Body mass index is 30.8 kg/m.  Wt Readings from Last 3 Encounters:  01/04/21 190 lb 12.8 oz (86.5 kg)  11/21/20 191 lb (86.6 kg)  10/16/20 190 lb 12.8 oz (86.5 kg)   Objective:  Physical Exam Vitals and nursing note reviewed.  Constitutional:      Appearance: Normal appearance.  HENT:     Head: Normocephalic and atraumatic.  Cardiovascular:     Rate and Rhythm: Normal rate and regular rhythm.     Heart sounds: Normal heart sounds.  Pulmonary:     Effort: Pulmonary effort is normal.     Breath sounds: Normal breath sounds.  Skin:    General:  Skin is warm.  Neurological:     General: No focal deficit present.     Mental Status: She is alert.  Psychiatric:        Mood and Affect: Mood normal.        Behavior: Behavior normal.         Assessment And Plan:     1. Uncontrolled type 2 diabetes mellitus with hyperglycemia (HCC) Comments: Chronic. I will check labs as listed below.  She will c/w Ozempic once weekly.  - CMP14+EGFR - Hemoglobin A1c - Lipid panel  2. Essential hypertension Comments: Chronic, well controlled. She will continue with bisoprolol. Encouraged to follow low sodium diet, less than 1582m/day.   3. Atherosclerosis of aorta ([place] Comments: Chronic, encouraged to follow heart healthy diet. Advised to avoid fried foods and comply with statin therapy.   4. COPD mixed type ([place] Comments:  Chronic, fair control. She will continue with current meds. Encouraged to avoid triggers and use Breztri as prescribed.    Patient was given opportunity to ask questions. Patient verbalized understanding of the plan and was able to repeat key elements of the plan. All questions were answered to their satisfaction.  [name], MD   I, [name], MD, have reviewed all documentation for this visit. The documentation on 01/09/21 for the exam, diagnosis, procedures, and orders are all accurate and complete.  THE PATIENT IS ENCOURAGED TO PRACTICE SOCIAL DISTANCING DUE TO THE COVID-19 PANDEMIC.

## 2021-01-05 LAB — LIPID PANEL
Chol/HDL Ratio: 2.8 ratio (ref 0.0–4.4)
Cholesterol, Total: 175 mg/dL (ref 100–199)
HDL: 62 mg/dL (ref >39)
LDL Chol Calc (NIH): 88 mg/dL (ref 0–99)
Triglycerides: 145 mg/dL (ref 0–149)
VLDL Cholesterol Cal: 25 mg/dL (ref 5–40)

## 2021-01-05 LAB — CMP14+EGFR
ALT: 27 IU/L (ref 0–32)
AST: 33 IU/L (ref 0–40)
Albumin/Globulin Ratio: 1.5 (ref 1.2–2.2)
Albumin: 4.2 g/dL (ref 3.7–4.7)
Alkaline Phosphatase: 70 IU/L (ref 44–121)
BUN/Creatinine Ratio: 10 — ABNORMAL LOW (ref 12–28)
BUN: 8 mg/dL (ref 8–27)
Bilirubin Total: 0.4 mg/dL (ref 0.0–1.2)
CO2: 27 mmol/L (ref 20–29)
Calcium: 9.7 mg/dL (ref 8.7–10.3)
Chloride: 101 mmol/L (ref 96–106)
Creatinine, Ser: 0.83 mg/dL (ref 0.57–1.00)
GFR calc Af Amer: 81 mL/min/{1.73_m2} (ref >59)
GFR calc non Af Amer: 70 mL/min/{1.73_m2} (ref >59)
Globulin, Total: 2.8 g/dL (ref 1.5–4.5)
Glucose: 96 mg/dL (ref 65–99)
Potassium: 5.1 mmol/L (ref 3.5–5.2)
Sodium: 142 mmol/L (ref 134–144)
Total Protein: 7 g/dL (ref 6.0–8.5)

## 2021-01-05 LAB — HEMOGLOBIN A1C
Est. average glucose Bld gHb Est-mCnc: 134 mg/dL
Hgb A1c MFr Bld: 6.3 % — ABNORMAL HIGH (ref 4.8–5.6)

## 2021-01-09 ENCOUNTER — Other Ambulatory Visit: Payer: Self-pay | Admitting: Internal Medicine

## 2021-01-10 DIAGNOSIS — Z20828 Contact with and (suspected) exposure to other viral communicable diseases: Secondary | ICD-10-CM | POA: Diagnosis not present

## 2021-01-18 ENCOUNTER — Telehealth: Payer: Self-pay

## 2021-01-18 NOTE — Chronic Care Management (AMB) (Signed)
Chronic Care Management Pharmacy Assistant   Name: [name]  MRN: [id number] DOB: [birth year]  Reason for Encounter: Disease State - Hypertension Adherence Call.  Patient Questions:   1.  Have you seen any other providers since your last visit? Yes [name], MD Pulmonary Disease- COPD-     2.  Any changes in your medicines or health? At visit with Dr [name] - Mucinex DM  1200 mg every 12 hours and supplement with Oxycodone 5 mg every 4 hours if needed/Prednisone 10 mg take  4 each am x 2 days,   2 each am x 2 days,  1 each am x 2 days and stop      PCP : [name], MD  Allergies:   Allergies  Allergen Reactions   Other Itching    HAIR DYE    Medications: Outpatient Encounter Medications as of 01/18/2021  Medication Sig   Accu-Chek FastClix Lancets MISC Inject 1 each as directed daily. Use as directed to check blood sugars daily dx: e11.22   albuterol (VENTOLIN HFA) 108 (90 Base) MCG/ACT inhaler INHALE 2 PUFFS INTO THE LUNGS EVERY 4 HOURS AS NEEDED FOR WHEEZING OR SHORTNESS OF BREATH   aspirin 81 MG tablet Take 81 mg by mouth daily.   Biotin 10000 MCG TBDP Take by mouth.   bisoprolol (ZEBETA) 5 MG tablet TAKE 1/2 TABLET(2.5 MG) BY MOUTH DAILY   Budeson-Glycopyrrol-Formoterol (BREZTRI AEROSPHERE) 160-9-4.8 MCG/ACT AERO Inhale 2 puffs into the lungs 2 (two) times daily.   CALCIUM-MAGNESIUM-ZINC PO Take 1 capsule by mouth daily.   Cholecalciferol (VITAMIN D3) 2000 UNITS TABS Take 2,000 Units by mouth daily.   famotidine (PEPCID) 20 MG tablet One after supper   gabapentin (NEURONTIN) 300 MG capsule Take 1 capsule (300 mg total) by mouth 4 (four) times daily.   glucose blood (ACCU-CHEK GUIDE) test strip 1 each by Other route daily. Use as instructed to check blood sugars 1 time per day dx: e11.22   ipratropium-albuterol (DUONEB) 0.5-2.5 (3) MG/3ML SOLN TAKE 3 MILLILITERS BY NEBULIZATION EVERY 6 HOURS AS NEEDED   lidocaine (LIDODERM) 5 % Place 3  patches onto the skin daily. Remove & Discard patch within 12 hours or as directed by MD   Magnesium 250 MG TABS Take 1 tablet by mouth daily.   montelukast (SINGULAIR) 10 MG tablet TAKE 1 TABLET(10 MG) BY MOUTH AT BEDTIME   pantoprazole (PROTONIX) 40 MG tablet TAKE 1 TABLET BY MOUTH 30 MINS BEFORE FIRST MEAL OF THE DAY   rosuvastatin (CRESTOR) 20 MG tablet TAKE 1 TABLET(20 MG) BY MOUTH AT BEDTIME   Semaglutide, 1 MG/DOSE, (OZEMPIC, 1 MG/DOSE,) 4 MG/3ML SOPN Inject 1 mg into the skin once a week.   No facility-administered encounter medications on file as of 01/18/2021.    Current Diagnosis: Patient Active Problem List   Diagnosis Date Noted   Former smoker 08/21/2020   High cholesterol    Type II diabetes mellitus, uncontrolled ([place]) 11/09/2018   Diarrhea    Community acquired pneumonia of left lower lobe of lung    Rhinovirus infection 10/02/2016   Acute asthma exacerbation 10/02/2016   COPD with acute exacerbation ([place]) 09/28/2016   Hypoxia 09/28/2016   SOB (shortness of breath) 09/28/2016   COPD exacerbation ([place]) 09/28/2016   Essential hypertension 11/03/2014   Cough 10/19/2014   COPD GOLD II  03/21/2014   Rhinitis, nonallergic 03/21/2014   Reviewed chart prior to disease state call. Spoke with patient regarding BP  Recent Office Vitals:  BP Readings from Last 3 Encounters:  01/04/21 118/68  11/21/20 130/76  10/16/20 118/70   Pulse Readings from Last 3 Encounters:  01/04/21 66  11/21/20 70  10/16/20 80    Wt Readings from Last 3 Encounters:  01/04/21 190 lb 12.8 oz (86.5 kg)  11/21/20 191 lb (86.6 kg)  10/16/20 190 lb 12.8 oz (86.5 kg)     Kidney Function Lab Results  Component Value Date/Time   CREATININE 0.83 01/04/2021 02:50 PM   CREATININE 0.76 09/06/2020 02:29 PM   GFRNONAA 70 01/04/2021 02:50 PM   GFRAA 81 01/04/2021 02:50 PM    BMP Latest Ref Rng & Units 01/04/2021 09/06/2020 05/04/2020  Glucose 65 - 99 mg/dL 96 72 82  BUN 8 - 27  mg/dL 8 5(L) 6(L)  Creatinine 0.57 - 1.00 mg/dL 0.83 0.76 0.76  BUN/Creat Ratio 12 - 28 10(L) 7(L) 8(L)  Sodium 134 - 144 mmol/L 142 141 141  Potassium 3.5 - 5.2 mmol/L 5.1 4.4 4.7  Chloride 96 - 106 mmol/L 101 99 102  CO2 20 - 29 mmol/L 27 28 24   Calcium 8.7 - 10.3 mg/dL 9.7 9.6 9.7    Current antihypertensive regimen:  o Bisoprolol 5 mg take 1/2 tablet daily   How often are you checking your Blood Pressure? Patient states she does not check blood pressure at home, states she does not have a blood pressure monitor.   Current home BP readings: None   What recent interventions/DTPs have been made by any provider to improve Blood Pressure control since last CPP Visit: Patient states she has been taking her medication as directed.   Any recent hospitalizations or ED visits since last visit with CPP? No   What diet changes have been made to improve Blood Pressure Control?  o Patient states she does not eat salt. Patient states she does not eat healthy. o   What exercise is being done to improve your Blood Pressure Control?  o Patient states she does not exercise because of breathing issues, states she is not active either. o   Adherence Review: Is the patient currently on ACE/ARB medication?   Does the patient have >5 day gap between last estimated fill dates?      Follow-Up:  Pharmacist Review - Patient wanted me to let [facility] know that she is not eating healthy, or exercising. Patient would also like to know if Dr. Lynder Parents office could order her a monitor where she does not have to prick her finger. She wants to make sure her insurance pays for meter.  Vallie Pearson,CPP Notified  Judithann Sheen, [facility] Clinical Pharmacist Assistant [phone]

## 2021-01-25 ENCOUNTER — Telehealth: Payer: Self-pay

## 2021-01-25 NOTE — Chronic Care Management (AMB) (Signed)
° ° °  Chronic Care Management Pharmacy Assistant   Name: [name]  MRN: [id number] DOB: [birth year]  Reason for Encounter: Medication Review    PCP : [name], MD  Allergies:   Allergies  Allergen Reactions   Other Itching    HAIR DYE    Medications: Outpatient Encounter Medications as of 01/25/2021  Medication Sig   Accu-Chek FastClix Lancets MISC Inject 1 each as directed daily. Use as directed to check blood sugars daily dx: e11.22   albuterol (VENTOLIN HFA) 108 (90 Base) MCG/ACT inhaler INHALE 2 PUFFS INTO THE LUNGS EVERY 4 HOURS AS NEEDED FOR WHEEZING OR SHORTNESS OF BREATH   aspirin 81 MG tablet Take 81 mg by mouth daily.   Biotin 10000 MCG TBDP Take by mouth.   bisoprolol (ZEBETA) 5 MG tablet TAKE 1/2 TABLET(2.5 MG) BY MOUTH DAILY   Budeson-Glycopyrrol-Formoterol (BREZTRI AEROSPHERE) 160-9-4.8 MCG/ACT AERO Inhale 2 puffs into the lungs 2 (two) times daily.   CALCIUM-MAGNESIUM-ZINC PO Take 1 capsule by mouth daily.   Cholecalciferol (VITAMIN D3) 2000 UNITS TABS Take 2,000 Units by mouth daily.   famotidine (PEPCID) 20 MG tablet One after supper   gabapentin (NEURONTIN) 300 MG capsule Take 1 capsule (300 mg total) by mouth 4 (four) times daily.   glucose blood (ACCU-CHEK GUIDE) test strip 1 each by Other route daily. Use as instructed to check blood sugars 1 time per day dx: e11.22   ipratropium-albuterol (DUONEB) 0.5-2.5 (3) MG/3ML SOLN TAKE 3 MILLILITERS BY NEBULIZATION EVERY 6 HOURS AS NEEDED   lidocaine (LIDODERM) 5 % Place 3 patches onto the skin daily. Remove & Discard patch within 12 hours or as directed by MD   Magnesium 250 MG TABS Take 1 tablet by mouth daily.   montelukast (SINGULAIR) 10 MG tablet TAKE 1 TABLET(10 MG) BY MOUTH AT BEDTIME   pantoprazole (PROTONIX) 40 MG tablet TAKE 1 TABLET BY MOUTH 30 MINS BEFORE FIRST MEAL OF THE DAY   rosuvastatin (CRESTOR) 20 MG tablet TAKE 1 TABLET(20 MG) BY MOUTH AT BEDTIME   Semaglutide, 1  MG/DOSE, (OZEMPIC, 1 MG/DOSE,) 4 MG/3ML SOPN Inject 1 mg into the skin once a week.   No facility-administered encounter medications on file as of 01/25/2021.    Current Diagnosis: Patient Active Problem List   Diagnosis Date Noted   Former smoker 08/21/2020   High cholesterol    Type II diabetes mellitus, uncontrolled ([place]) 11/09/2018   Diarrhea    Community acquired pneumonia of left lower lobe of lung    Rhinovirus infection 10/02/2016   Acute asthma exacerbation 10/02/2016   COPD with acute exacerbation ([place]) 09/28/2016   Hypoxia 09/28/2016   SOB (shortness of breath) 09/28/2016   COPD exacerbation ([place]) 09/28/2016   Essential hypertension 11/03/2014   Cough 10/19/2014   COPD GOLD II  03/21/2014   Rhinitis, nonallergic 03/21/2014     Follow-Up:  Pharmacist Review-Reviewed Chart and adherence measures. Per Insurance data Total Gaps-Star Measures are (0). Total Gaps-All Measures are (1). 1. Colorectal Cancer Screening not performed.  [name], CPP Notified.  Raynelle Highland, [place] Pharmacist Assistant [phone]

## 2021-01-31 ENCOUNTER — Telehealth: Payer: Self-pay

## 2021-01-31 NOTE — Telephone Encounter (Signed)
The pt was notified that her ozempic has been delivered from the [employer] patient assistance  And is ready for pickup.

## 2021-02-05 ENCOUNTER — Telehealth: Payer: Self-pay

## 2021-02-05 NOTE — Progress Notes (Signed)
Notified the patient of upcoming CCM Call follow-up on 02/06/21 at 1:30 pm with [name], CPP and to have medications and supplements near during phone call. The patient confirmed and verbalized understanding.  [name], CPP Notified.  Raynelle Highland, [place] Pharmacist Assistant [phone]

## 2021-02-06 ENCOUNTER — Ambulatory Visit (INDEPENDENT_AMBULATORY_CARE_PROVIDER_SITE_OTHER): Payer: Medicare Other

## 2021-02-06 DIAGNOSIS — E1165 Type 2 diabetes mellitus with hyperglycemia: Secondary | ICD-10-CM

## 2021-02-06 DIAGNOSIS — I10 Essential (primary) hypertension: Secondary | ICD-10-CM | POA: Diagnosis not present

## 2021-02-06 DIAGNOSIS — E7800 Pure hypercholesterolemia, unspecified: Secondary | ICD-10-CM | POA: Diagnosis not present

## 2021-02-06 NOTE — Progress Notes (Signed)
Chronic Care Management Pharmacy Note  02/06/2021 Name:  [name] MRN:  [id number] DOB:  May 17, 1946  Subjective: Hal Neer Malloy is an 75 y.o. year old female who is a primary patient of [name], MD.  The CCM team was consulted for assistance with disease management and care coordination needs.    Engaged with patient by telephone for follow up visit in response to provider referral for pharmacy case management and/or care coordination services.   Consent to Services:  The patient was given information about Chronic Care Management services, agreed to services, and gave verbal consent prior to initiation of services.  Please see initial visit note for detailed documentation.   Patient Care Team: [name], MD as PCP - General (Internal Medicine) Lynne Logan, RN as Case Manager Mayford Knife, [facility] (Pharmacist)  Recent office visits:/ 01/04/2021 Recent OV Recent consult visits: 12/28/2020 OV COPD GOLD II  Objective:  Lab Results  Component Value Date   CREATININE 0.83 01/04/2021   BUN 8 01/04/2021   GFRNONAA 70 01/04/2021   GFRAA 81 01/04/2021   NA 142 01/04/2021   K 5.1 01/04/2021   CALCIUM 9.7 01/04/2021   CO2 27 01/04/2021    Lab Results  Component Value Date/Time   HGBA1C 6.3 (H) 01/04/2021 02:50 PM   HGBA1C 6.3 (H) 09/06/2020 02:25 PM   MICROALBUR 10 09/06/2020 12:54 PM   MICROALBUR 10 09/01/2019 02:42 PM    Last diabetic Eye exam:  Lab Results  Component Value Date/Time   HMDIABEYEEXA No Retinopathy 12/04/2020 12:00 AM    Last diabetic Foot exam: No results found for: HMDIABFOOTEX   Lab Results  Component Value Date   CHOL 175 01/04/2021   HDL 62 01/04/2021   LDLCALC 88 01/04/2021   TRIG 145 01/04/2021   CHOLHDL 2.8 01/04/2021    Hepatic Function Latest Ref Rng & Units 01/04/2021 05/04/2020 09/01/2019  Total Protein 6.0 - 8.5 g/dL 7.0 7.1 7.7  Albumin 3.7 - 4.7 g/dL 4.2 4.6 4.7  AST 0 - 40 IU/L 33 34 22  ALT 0 - 32 IU/L _0 Alk Phosphatase 44 - 121 IU/L 70 74 70  Total Bilirubin 0.0 - 1.2 mg/dL 0.4 0.2 0.4    Lab Results  Component Value Date/Time   TSH 1.400 01/04/2020 10:57 AM   TSH 0.88 07/29/2018 12:00 AM    CBC Latest Ref Rng & Units 10/16/2020 09/06/2020 09/01/2019  WBC 4.0 - 10.5 K/uL 6.0 6.2 7.0  Hemoglobin 12.0 - 15.0 g/dL 13.8 14.2 13.5  Hematocrit 36.0 - 46.0 % 41.2 45.0 41.8  Platelets 150.0 - 400.0 K/uL 399.0 373 307    Lab Results  Component Value Date/Time   VD25OH 33.1 09/06/2020 02:25 PM   VD25OH 39.9 07/29/2018 12:00 AM    Clinical ASCVD: Yes  The ASCVD Risk score ([place]., et al., 2013) failed to calculate for the following reasons:   Unable to determine if patient is Non-Hispanic African American    Depression screen [facility] 2/9 09/06/2020 09/01/2019 07/01/2019  Decreased Interest 0 0 0  Down, Depressed, Hopeless 0 3 0  PHQ - 2 Score 0 3 0  Altered sleeping - 0 -  Tired, decreased energy - 0 -  Change in appetite - 0 -  Feeling bad or failure about yourself  - 0 -  Trouble concentrating - 0 -  Moving slowly or fidgety/restless - 0 -  Suicidal thoughts - 0 -  PHQ-9 Score - 3 -  Difficult doing work/chores - Not difficult at all -  Some recent data might be hidden    No flowsheet data found. - NO CAT score found on file     Social History   Tobacco Use  Smoking Status Former Smoker  . Packs/day: 0.25  . Years: 42.00  . Pack years: 10.50  . Types: Cigarettes  . Quit date: 01/30/2006  . Years since quitting: 15.0  Smokeless Tobacco Never Used   BP Readings from Last 3 Encounters:  01/04/21 118/68  11/21/20 130/76  10/16/20 118/70   Pulse Readings from Last 3 Encounters:  01/04/21 66  11/21/20 70  10/16/20 80   Wt Readings from Last 3 Encounters:  01/04/21 190 lb 12.8 oz (86.5 kg)  11/21/20 191 lb (86.6 kg)  10/16/20 190 lb 12.8 oz (86.5 kg)    Assessment/Interventions: Review of patient past medical history, allergies, medications, health status, including  review of consultants reports, laboratory and other test data, was performed as part of comprehensive evaluation and provision of chronic care management services.   SDOH:  (Social Determinants of Health) assessments and interventions performed: Yes   CCM Care Plan  Allergies  Allergen Reactions  . Other Itching    HAIR DYE    Medications Reviewed Today    Reviewed by Dorothyann Peng, MD (Physician) on 01/04/21 at 1423  Med List Status: <None>  Medication Order Taking? Sig Documenting Provider Last Dose Status Informant  Accu-Chek FastClix Lancets MISC [id number] Yes Inject 1 each as directed daily. Use as directed to check blood sugars daily dx: e11.22 Dorothyann Peng, MD Taking Active   albuterol (VENTOLIN HFA) 108 [phone] Base) MCG/ACT inhaler [id number] Yes INHALE 2 PUFFS INTO THE LUNGS EVERY 4 HOURS AS NEEDED FOR WHEEZING OR SHORTNESS OF Weyman Rodney, MD Taking Active   aspirin 81 MG tablet [id number] Yes Take 81 mg by mouth daily. [provider] Taking Active Self  Biotin 04799 MCG TBDP [id number] Yes Take by mouth. [provider] Taking Active   bisoprolol (ZEBETA) 5 MG tablet [id number] Yes TAKE 1/2 TABLET(2.5 MG) BY MOUTH DAILY Dorothyann Peng, MD Taking Active   Budeson-Glycopyrrol-Formoterol (BREZTRI AEROSPHERE) 160-9-4.8 MCG/ACT AERO [id number] Yes Inhale 2 puffs into the lungs 2 (two) times daily. [name], MD Taking Active   CALCIUM-MAGNESIUM-ZINC PO [id number] Yes Take 1 capsule by mouth daily. [provider] Taking Active   Cholecalciferol (VITAMIN D3) 2000 UNITS TABS [id number] Yes Take 2,000 Units by mouth daily. [provider] Taking Active Self  famotidine (PEPCID) 20 MG tablet [id number] Yes One after supper [name], MD Taking Active   gabapentin (NEURONTIN) 300 MG capsule [id number] Yes Take 1 capsule (300 mg total) by mouth 4 (four) times daily. [name], MD Taking Active   glucose blood (ACCU-CHEK GUIDE) test strip  [id number] Yes 1 each by Other route daily. Use as instructed to check blood sugars 1 time per day dx: e11.22 Dorothyann Peng, MD Taking Active   ipratropium-albuterol (DUONEB) 0.5-2.5 (3) MG/3ML Criss Rosales [id number] Yes TAKE 3 MILLILITERS BY NEBULIZATION EVERY 6 HOURS AS NEEDED Dorothyann Peng, MD Taking Active   lidocaine (LIDODERM) 5 % [id number] Yes Place 3 patches onto the skin daily. Remove & Discard patch within 12 hours or as directed by MD Dorothyann Peng, MD Taking Active   Magnesium 250 MG TABS [id number] Yes Take 1 tablet by mouth daily. [provider] Taking Active   montelukast (SINGULAIR) 10 MG tablet [id number] Yes TAKE 1 TABLET(10 MG) BY  MOUTH AT BEDTIME Tanda Rockers, MD Taking Active   pantoprazole (PROTONIX) 40 MG tablet [id number] Yes Take 13min before the first meal of the day. [name], MD Taking Active   rosuvastatin (CRESTOR) 20 MG tablet [id number] Yes TAKE 1 TABLET(20 MG) BY MOUTH AT BEDTIME [name], MD Taking Active   Semaglutide,0.25 or 0.5MG /DOS, (OZEMPIC, 0.25 OR 0.5 MG/DOSE,) 2 MG/1.5ML SOPN [id number] Yes Inject 0.5 mg into the skin once a week.  Patient taking differently: Inject 1 mg into the skin once a week.   [name], MD Taking Active           Patient Active Problem List   Diagnosis Date Noted  . Former smoker 08/21/2020  . High cholesterol   . Type II diabetes mellitus, uncontrolled ([place]) 11/09/2018  . Diarrhea   . Community acquired pneumonia of left lower lobe of lung   . Rhinovirus infection 10/02/2016  . Acute asthma exacerbation 10/02/2016  . COPD with acute exacerbation ([place]) 09/28/2016  . Hypoxia 09/28/2016  . SOB (shortness of breath) 09/28/2016  . COPD exacerbation ([place]) 09/28/2016  . Essential hypertension 11/03/2014  . Cough 10/19/2014  . COPD GOLD II  03/21/2014  . Rhinitis, nonallergic 03/21/2014    Immunization History  Administered Date(s) Administered  . Fluad Quad(high Dose 65+) 09/06/2020  . Influenza Split  09/29/2013, 09/30/2015  . Influenza Whole 08/30/2016  . Influenza, High Dose Seasonal PF 09/29/2017, 09/14/2018, 09/01/2019  . Influenza,inj,Quad PF,6+ Mos 09/12/2014  . Influenza-Unspecified 09/14/2018  . PFIZER(Purple Top)SARS-COV-2 Vaccination 01/19/2020, 02/09/2020, 12/21/2020  . Pneumococcal Conjugate-13 01/06/2020  . Pneumococcal Polysaccharide-23 10/07/2013  . Pneumococcal-Unspecified 10/05/2013  . Tdap 06/07/2019, 06/09/2019    Conditions to be addressed/monitored:  Hypertension, Hyperlipidemia and Diabetes  There are no care plans that you recently modified to display for this patient.    Medication Assistance: Patient Assistanceobtained through [employer] medication assistance program.  Enrollment ends at the end of the year 2022  Patient's preferred pharmacy is:  [address], [place] [address] [address] Phone: [phone] Fax: [phone]  Uses pill box? Yes Pt endorses 100% compliance  We discussed: Benefits of medication synchronization, packaging and delivery as well as enhanced pharmacist oversight with [employer]. Patient decided to: Continue current medication management strategy  Follow Up:  Patient agrees to Care Plan and Follow-up.  Plan: Telephone follow up appointment with care management team member scheduled for:  05/08/2021 [name], PharmD Clinical Pharmacist [facility] [phone]  Current Barriers:  . Unable to achieve control of cholesterol    Pharmacist Clinical Goal(s):  Marland Kitchen Over the next 90 days, patient will achieve adherence to monitoring guidelines and medication adherence to achieve therapeutic efficacy through collaboration with PharmD and provider.    Interventions: . 1:1 collaboration with [name], MD regarding development and update of comprehensive plan of care as evidenced by provider attestation and  co-signature . Inter-disciplinary care team collaboration (see longitudinal plan of care) . Comprehensive medication review performed; medication list updated in electronic medical record  Hypertension (BP goal <130/80) -controlled -Current treatment:  . Bisoprolol take a 1/2 tablet by mouth daily.  -Current home readings:Patient is not checking her BP at home.  -Current dietary habits: Patient reports that she has been eating more vegetables in the last 3-4 weeks. She reports that she is cooking a balanced meal.  -Current exercise habits: she is using youtube  to exercise using a walking video and she is also doing the chair exercises. She reports doing 20 minutes a day Monday through Friday.  -Denies hypotensive/hypertensive symptoms -Educated on Daily salt intake goal < 2300 mg; Exercise goal of 150 minutes per week; Importance of home blood pressure monitoring; -Counseled to monitor BP at home once a week, document, and provide log at future appointments -Recommended to continue current medication  Hyperlipidemia: (LDL goal < 70) -uncontrolled -Current treatment: . Crestor 20 mg tablet daily -Current dietary patterns: patient reports that her daughter and husband who are overweight so she is trying to work on eating more healthy  -Educated on Cholesterol goals;  Benefits of statin for ASCVD risk reduction; Importance of limiting foods high in cholesterol; Exercise goal of 150 minutes per week; -Counseled on diet and exercise extensively Recommended to continue current medication  Diabetes (A1c goal <7%) -controlled -Current medications: . Ozempic 1 mg - taking once a week  -Current home glucose readings - checking once a week, pt reports that it takes time to check her BS as a finger prick  . fasting glucose: 90 -97  -Denies hypoglycemic/hyperglycemic symptoms -Current meal patterns:  . dinner: Pt reports her meal is well balanced  . drinks: she is increasing the amount of  water she is drinking, 1/2 gallon of water. Every once in a while she will have sweet tea with dinner. She reports that she is drinking 100% Welch's Grape juice everyday, she keeps it by her bedside it is 10 ounces and she does not drink it all at night.  -Current exercise: 20 minutes per day 5 days a week.  -Educated onA1c and blood sugar goals; Exercise goal of 150 minutes per week; Benefits of routine self-monitoring of blood sugar; Continuous glucose monitoring; Carbohydrate counting and/or plate method -Counseled to check feet daily and get yearly eye exams, patient requested more details about her feet and how to check them    Avoid going barefoot, even at home, and especially on hot decks and hot sand  Test water temperature before stepping into a bath  Trim toenails to shape of the toe, and remove sharp edges with a nail file; do not cut cuticles  Wash in lukewarm water, dry thoroughly (including between the toes), and check feet daily  Shoes should be snug, but not tight, and customized if feet are misshapen or have ulcers  Socks should fit and be changed daily -Recommended to continue current medication   Patient Goals/Self-Care Activities . Over the next 90 days, patient will:  - take medications as prescribed check glucose at least once per week , document, and provide at future appointments target a minimum of 150 minutes of moderate intensity exercise weekly  Follow Up Plan: Telephone follow up appointment with care management team member scheduled for: 05/08/2021   [name], PharmD Clinical Pharmacist [facility] [phone]

## 2021-02-13 DIAGNOSIS — Z1231 Encounter for screening mammogram for malignant neoplasm of breast: Secondary | ICD-10-CM | POA: Diagnosis not present

## 2021-02-13 LAB — HM MAMMOGRAPHY: HM Mammogram: NORMAL (ref 0–4)

## 2021-02-16 ENCOUNTER — Telehealth: Payer: Self-pay

## 2021-02-16 ENCOUNTER — Telehealth: Payer: Self-pay | Admitting: Internal Medicine

## 2021-02-16 NOTE — Chronic Care Management (AMB) (Signed)
Chronic Care Management Pharmacy Assistant   Name: [name]  MRN: [id number] DOB: Feb 10, 1946  Reason for Encounter: Patient Assistance Coordination   PCP : [name], MD  Allergies:   Allergies  Allergen Reactions  . Other Itching    HAIR DYE    Medications: Outpatient Encounter Medications as of 02/16/2021  Medication Sig  . Accu-Chek FastClix Lancets MISC Inject 1 each as directed daily. Use as directed to check blood sugars daily dx: e11.22  . albuterol (VENTOLIN HFA) 108 (90 Base) MCG/ACT inhaler INHALE 2 PUFFS INTO THE LUNGS EVERY 4 HOURS AS NEEDED FOR WHEEZING OR SHORTNESS OF BREATH  . aspirin 81 MG tablet Take 81 mg by mouth daily.  . Biotin 10000 MCG TBDP Take by mouth.  . bisoprolol (ZEBETA) 5 MG tablet TAKE 1/2 TABLET(2.5 MG) BY MOUTH DAILY  . Budeson-Glycopyrrol-Formoterol (BREZTRI AEROSPHERE) 160-9-4.8 MCG/ACT AERO Inhale 2 puffs into the lungs 2 (two) times daily.  Marland Kitchen CALCIUM-MAGNESIUM-ZINC PO Take 1 capsule by mouth daily.  . Cholecalciferol (VITAMIN D3) 2000 UNITS TABS Take 2,000 Units by mouth daily.  . famotidine (PEPCID) 20 MG tablet One after supper  . gabapentin (NEURONTIN) 300 MG capsule Take 1 capsule (300 mg total) by mouth 4 (four) times daily.  Marland Kitchen glucose blood (ACCU-CHEK GUIDE) test strip 1 each by Other route daily. Use as instructed to check blood sugars 1 time per day dx: e11.22  . ipratropium-albuterol (DUONEB) 0.5-2.5 (3) MG/3ML SOLN TAKE 3 MILLILITERS BY NEBULIZATION EVERY 6 HOURS AS NEEDED  . lidocaine (LIDODERM) 5 % Place 3 patches onto the skin daily. Remove & Discard patch within 12 hours or as directed by MD  . Magnesium 250 MG TABS Take 1 tablet by mouth daily.  . montelukast (SINGULAIR) 10 MG tablet TAKE 1 TABLET(10 MG) BY MOUTH AT BEDTIME  . pantoprazole (PROTONIX) 40 MG tablet TAKE 1 TABLET BY MOUTH 30 MINS BEFORE FIRST MEAL OF THE DAY  . rosuvastatin (CRESTOR) 20 MG tablet TAKE 1 TABLET(20 MG) BY MOUTH AT BEDTIME  .  Semaglutide, 1 MG/DOSE, (OZEMPIC, 1 MG/DOSE,) 4 MG/3ML SOPN Inject 1 mg into the skin once a week.   No facility-administered encounter medications on file as of 02/16/2021.    Current Diagnosis: Patient Active Problem List   Diagnosis Date Noted  . Former smoker 08/21/2020  . High cholesterol   . Type II diabetes mellitus, uncontrolled ([place]) 11/09/2018  . Diarrhea   . Community acquired pneumonia of left lower lobe of lung   . Rhinovirus infection 10/02/2016  . Acute asthma exacerbation 10/02/2016  . COPD with acute exacerbation ([place]) 09/28/2016  . Hypoxia 09/28/2016  . SOB (shortness of breath) 09/28/2016  . COPD exacerbation ([place]) 09/28/2016  . Essential hypertension 11/03/2014  . Cough 10/19/2014  . COPD GOLD II  03/21/2014  . Rhinitis, nonallergic 03/21/2014   02/16/21-Called and spoke with the patient regarding her [facility] assistance application. Patient voiced she is following up to know the status of her recent renewal application for her Breztri medication. I communicated with the patient she will need to contact Dr. Gustavus Bryant office who is the authorizing physician specialist. The patient verbalized understanding. Patient stated she will call Dr. Gustavus Bryant office today.  Patient called back and voiced she spoke with Dr. Gustavus Bryant office and was told they did not receive application forms for [facility] assistance. Told the patient I will follow up with Tamala regarding this matter. The patient voiced understanding.  Per Johny Blamer assistance application was faxed over  to Dr. Gustavus Bryant office again. Will follow up to see if they received.  02/22/21-Called Dr. Gustavus Bryant office and spoke with Arnetta at the front desk to see if the fax was received. Per Arnetta; all forms for the physician to complete typically goes straight to the [place]. Arnetta voiced she will send a message for his nurse to call me back regarding this matter.  Called returned from Dr. Gustavus Bryant nurse explaining they did  not receive a fax of the patient assistant forms for the patient. Confirmed Dr. Gustavus Bryant office fax number [phone] over the phone with his nurse. Informed the nurse we will attempt to try again. The nurse verbalized understanding.   Staff message sent to to [facility] to resend the patient assistance application for [name].  Follow-Up:  Pharmacist Review-Will continue to follow-up regarding this matter.   [name], CPP Notified.  Raynelle Highland, [place] Pharmacist Assistant [phone] CCM Total Time: 28 minutes (8 minutes total spent on call with Dr. Gustavus Bryant office)

## 2021-02-16 NOTE — Telephone Encounter (Signed)
Called spoke with patient let her know that we haven't received anything.  And that if we did and it was patient assistance paperwork we would have sent it back to the company on the forms not her PCP. She states she will contact her PCP and follow up. I also told her to check with her pharmacy to see if it can be refilled through patient assistance.   Nothing further needed at this time

## 2021-02-22 ENCOUNTER — Telehealth: Payer: Self-pay | Admitting: Internal Medicine

## 2021-02-22 NOTE — Telephone Encounter (Signed)
I called and spoke with candace and she is aware that we have not received the papers for breztri..she is going to have them refax these forms.

## 2021-02-22 NOTE — Telephone Encounter (Signed)
I have been in the [place] clinic this week and have not received any paperwork from the [place] office. Can someone please check in Dr Gustavus Bryant lookat for the forms the pt is calling about? Thanks!

## 2021-02-22 NOTE — Telephone Encounter (Signed)
LR have you seen any paper work on this pt for MW for the breztri?  Thanks

## 2021-02-23 NOTE — Telephone Encounter (Signed)
Forms are in Dr Gustavus Bryant lookat

## 2021-02-23 NOTE — Telephone Encounter (Signed)
Will forward to [place] to look out for forms.

## 2021-02-26 ENCOUNTER — Ambulatory Visit (INDEPENDENT_AMBULATORY_CARE_PROVIDER_SITE_OTHER): Payer: Medicare Other | Admitting: Internal Medicine

## 2021-02-26 ENCOUNTER — Telehealth: Payer: Self-pay | Admitting: *Deleted

## 2021-02-26 ENCOUNTER — Other Ambulatory Visit: Payer: Self-pay

## 2021-02-26 ENCOUNTER — Telehealth: Payer: Self-pay | Admitting: Internal Medicine

## 2021-02-26 ENCOUNTER — Other Ambulatory Visit: Payer: Self-pay | Admitting: Internal Medicine

## 2021-02-26 ENCOUNTER — Encounter: Payer: Self-pay | Admitting: Internal Medicine

## 2021-02-26 DIAGNOSIS — R059 Cough, unspecified: Secondary | ICD-10-CM

## 2021-02-26 DIAGNOSIS — J449 Chronic obstructive pulmonary disease, unspecified: Secondary | ICD-10-CM

## 2021-02-26 MED ORDER — BREZTRI AEROSPHERE 160-9-4.8 MCG/ACT IN AERO: 2.0000 | INHALATION_SPRAY | Freq: Two times a day (BID) | RESPIRATORY_TRACT | 0 refills | Status: DC

## 2021-02-26 MED ORDER — BREZTRI AEROSPHERE 160-9-4.8 MCG/ACT IN AERO
2.0000 | INHALATION_SPRAY | Freq: Two times a day (BID) | RESPIRATORY_TRACT | 3 refills | Status: DC
Start: 2021-02-26 — End: 2021-04-04

## 2021-02-26 NOTE — Assessment & Plan Note (Signed)
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 - try off cozar 11/03/2014 >>> - neurontin 100 tid rx 11/30/14 > improved 01/03/2015 > changed to lyrica 04/2015 but not taking consistently as of 01/30/2016  - sinus CT 02/17/15 > neg  - FENO  04/14/17 =  14  - rechallenged with gabapentin 300 qid 07/22/2018 and if not better > refer to Dr Joya Gaskins at [facility] voice center > did not go, referred again 01/07/2019 > see eval 03/02/2019 rec speech therapy/tramadol (pt refused latter)   - flare early Sep 2021 off gabapentin and on breztri > changed back to gabapentin / symb 80 2bid > preferred breztri  - 11/21/2020 rec increase gabapentin to 300 qid to eliminate need for tramadol  - 12/28/2020 acute breakthru despite following instructions x for bed blocks > rx oxy 5 mg q 4 hr prn  Did not try the bed blocks and not consistent with the qid gabapentin but off all narcs for cough and doing the best she has in a long time so no cahgne rx           Each maintenance medication was reviewed in detail including emphasizing most importantly the difference between maintenance and prns and under what circumstances the prns are to be triggered using an action plan format where appropriate.  Total time for H and P, chart review, counseling, reviewing hfa  device(s) and generating customized AVS unique to this office visit / same day charting = 27 min

## 2021-02-26 NOTE — Telephone Encounter (Signed)
Received pt assistance forms (they were faxed to [place] from [facility]) for [facility]  MW filled out and signed his portion, and I had him sign rx for [facility]  The pt portion of the form was blank- called pt and advised needs to complete this and return to [place] so we can fax, or she can mail it off herself  She states that she is not est with [facility], and not sure why they would be sending [place] this  She does want to complete them though, since she does need assistance with Judithann Sauger  I have mailed the forms to her  Will hold in my basket to f/u on

## 2021-02-26 NOTE — Patient Instructions (Signed)
No change in your medications.     Please schedule a follow up visit in 6 months but call sooner if needed

## 2021-02-26 NOTE — Telephone Encounter (Signed)
done

## 2021-02-26 NOTE — Progress Notes (Signed)
Subjective:   Patient ID: [name], female    DOB: 25-Jul-1946    MRN: [id number]    Brief patient profile:  70  yobf mother of a PA @ [place]  fast med  quit smoking 2007 with chronic cough x 2011 self referred for evaluation of persistent cough in setting of technically GOLD II copd dx 02/2014 with only mild obstructive pattern - has noted since high school a tendency to daily  tickle in throat causing intermittent cough waxing and waning ever since    History of Present Illness  10/19/2014 1st [place] [facility] office visit/ [facility]   Chief Complaint  Patient presents with  . Pulmonary Consult    Former pt of Dr. Annamaria Boots. Pt c/o increased cough x 3 wks. Cough is prod with moderate, thick, light yellow sputum.  She coughs until loses urinary continence.  Not sleeping well due to cough.   remembers having tendency to throat tickle in high school  but completely resolved by graduation  then recurred in her 72s and resolved with codeine then recurred in 2011  but bad to worse x 3 weeks assoc with sensation of choking, mucus is thick light yellow.  acei stopped about a month prior to OV  Replaced by losartan  Inhalers make it worse, esp dpi  Kouffman Reflux v Neurogenic Cough Differentiator Reflux Comments  Do you awaken from a sound sleep coughing violently?                            With trouble breathing? Yes   Do you have choking episodes when you cannot  Get enough air, gasping for air ?              Yes   Do you usually cough when you lie down into  The bed, or when you just lie down to rest ?                          Yes   Do you usually cough after meals or eating?         maybe   Do you cough when (or after) you bend over?    No    GERD SCORE     Kouffman Reflux v Neurogenic Cough Differentiator Neurogenic   Do you more-or-less cough all day long? yes   Does change of temperature make you cough? no   Does laughing or chuckling cause you to cough? no   Do fumes (perfume,  automobile fumes, burned  Toast, etc.,) cause you to cough ?      Not aware   Does speaking, singing, or talking on the phone cause you to cough   ?               sometimes   Neurogenic/Airway score      rec  Max rx gerd/prn tramadol        09/15/2017  Transition of care  f/u ov/[facility] re: COPD II s/p aecopd Chief Complaint  Patient presents with  . Hospitalization Follow-up    Breathing has improved some, but not back at her normal baseline. She states her cough woke her up this am and had some bloody nasal d/c.  She has been coughing up some thick, yellow sputum.  She is using   walking all over [place] fine,  Flew back to [place]  on a commercial jet and short  of breath  walking from gate to baggage [place] then by 2 days later in ER due to severe sob / tightness across upper back >> albuterol relived the discomfort w/in 5 min but did not have her rescue so had to call 911 (proair listed on med calendar she just got from me 08/19/17 under written action plan) - says was so short of breath at that point could not have used saba anyway. Not following action plan either re controlling cough which is also worse/ has not started meds from discharge  rec Plan A = Automatic = symbicort 80 Take 2 puffs first thing in am and then another 2 puffs about 12 hours later.  Work on Interior and spatial designer: Plan B = Backup Only use your albuterol as a rescue medication       03/23/2018 acute extended  ov/[facility] re:   Copd  - extended ov re new noct cough /wheeze and need for saba / no med calendar Chief Complaint  Patient presents with  . Acute Visit    Breathing is overall doing well. She uses her albuterol inhaler 4 x per wk on average. She has not needed her neb.  Mid February breathing /coughing  worse / wheezing every noct needs albuterol every noct Very confused with instructions/ now has neb but did not disclose med she uses at the med calendar ov and did not bring the med calendar with her  nor the med for the nebulizer and easily frustrated re concept of med reconciliation  Daytime sob = MMRC2 = can't walk a nl pace on a flat grade s sob but does fine slow and flat  rec Plan A = Automatic = symbicort 160 Take 2 puffs first thing in am and then another 2 puffs about 12 hours later.  Plan B = Backup Only use your albuterol as a rescue medication  Plan C = Crisis - only use your albuterol nebulizer if you first try Plan B and it fails to help > ok to use the nebulizer up to every 4 hours but if start needing it regularly call for immediate appointment     04/20/2018  f/u ov/[facility] re:   Copd gold II / brought med calendar but not clear using consistently / chronic cough/ hbp Chief Complaint  Patient presents with  . Follow-up    COPD , feel good no problems at this time  Dyspnea:  Dances   2- 3 songs / shopping is ok  Cough: better, mostly dry/ throat clearing / noct  Tessalon maybe once every couple days  "it says to take it 3 x daily"  Sleep: better  while maint on symbicort 160 2bid Sleeping fine on 1 pillow     Symbicort 80 or 160 should be  Take 2 puffs first thing in am and then another 2 puffs about 12 hours later- you can pick which one you prefer but if  they are the same then use the lower strength Please schedule a follow up visit in 3 months but call sooner if needed     07/22/2018  f/u ov/[facility] re:   GOLD II copd / has calendar not using the action plans at bottom/ since previous ov stopped lyrica and took gabapentin 100 tid x 3 weeks then stopped (was supposed to stay on until ov) Chief Complaint  Patient presents with  . Follow-up    Cough has improved slightly but "still driving me crazy"- prod with clear sputum.  She  uses her proair 2 x per wk and rarely uses neb.   Dyspnea:   MMRC2 = can't walk a nl pace on a flat grade s sob but does fine slow and flat  Cough  Tickle/ gag anytime day except doesn't wake her up goes back to childhood SABA use: twice daily on  bad days but recently less / hardly ever uses neb  Says only thing that stops her cough is oxycodone rec Take delsym two tsp every 12 hours and supplement if needed with  Oxycodone  up to 1 every 4 hours to suppress the urge to cough.  Once you have eliminated the cough for 3 straight days try reducing the oxycodone first,  then the delsym as tolerated.   For drainage / throat tickle try take CHLORPHENIRAMINE  4 mg - take one every 4 hours as needed   Reduce symbicort 80 Take 2 puffs first thing in am and then another 2 puffs about 12 hours later. > did not continue consistently "you didn't make this clear"  Gabapentin 300 mg four times a day - if not doing better we need to refer you to [facility] / Dr Joya Gaskins > did not do  See Tammy NP in 6  weeks with all your medications>  Did not do    01/07/2019  Acute extended  ov/[facility] re:  Flare of wheeze off symbicort, cough never resolved / no med calendar/ poor insight into meds/ action plans  Chief Complaint  Patient presents with  . Acute Visit    pt c/o wheezing, prod cough with gray/white mucus, pnd, stuffy nose.   Dyspnea:  MMRC3 = can't walk 100 yards even at a slow pace at a flat grade s stopping due to sob   Cough: severe fits of coughing day and noc  Sleeping: poorly due to cough and wheeze SABA use: qid instead of symbicort  rec Plan A = Automatic = Symbicort 80 Take 2 puffs first thing in am and then another 2 puffs about 12 hours later.  Plan B = Backup (add this to Plan A, not in place of it)  Only use your albuterol inhaler as a rescue medication Plan C = Crisis - only use your albuterol nebulizer if you first try Plan B For drainage / throat tickle try take CHLORPHENIRAMINE  4 mg(Chlortab at [facility]) -  Take delsym two tsp every 12 hours and supplement if needed with  vicodine up to 2 every 4 hours to suppress the urge to cough. Swallowing water and/or using ice chips/non mint and menthol containing candies (such as lifesavers or  sugarless jolly ranchers) are also effective.  You should rest your voice and avoid activities that you know make you cough. Once you have eliminated the cough for 3 straight days try reducing the vicodin first,  then the delsym as tolerated.   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 We discussed treatment options including observation, continuing PPI therapy, an evaluation in the voice lab to assess her candidacy for voice therapy and respiratory retraining, or a trial of tramadol.  04/16/2019 acute extended ov/ [facility] re recurrent cough on gabapentin 300 tid / did not fill tramadol "I told Dr Joya Gaskins it wouldn't work" "only better with oxycodone"  Chief Complaint  Patient presents with  . Acute Visit    Increased cough x 3 wks- prod with white sputum. She has had some wheezing also. She is using her albuterol inhaler at least once per day and rarely uses neb.  Dyspnea:  Minimal  Cough: worse tickle/ no h1 on hand says plans to pick it up  Sleeping: fine 2 pillows / not typically waking her up  SABA use: rarely  02: none  Doesn't think delsym helped/ no h1 on hand / using gabapentin 300 mg tid   gen chest discomfort with severe coughing fits  rec Prednisone 10 mg take  4 each am x 2 days,   2 each am x 2 days,  1 each am x 2 days and stop  Take delsym two tsp every 12 hours and supplement if needed with oxidocone 5 mg up to 2 every 4 hours to suppress the urge to cough.   Once you have eliminated the cough for 3 straight days try reducing the oxycodone first,  then the delsym as tolerated.   Keep your appts with speech therapy  Please schedule a follow up visit in  3 months but call sooner if needed  with all medications /inhalers/ solutions in hand so we can verify exactly what you are taking. This includes all medications from all doctors and over the counters     11/21/2020  f/u ov/[facility] re:  GOLD II / breztri better than symbicort  Chief Complaint  Patient presents with  . Follow-up    She states she has alot of SOB when she wakes up in the morning. She rarely uses her albuterol inhaler, but has been using neb almost every night.   Dyspnea:  Gets up around 630 am does better after taking am breztri   Cough: sporadic but dry  / less need for tramadol on gabapentin usually just taking 300 bid  Sleeping: fine 2 pillows  SABA use: every other night sev hours p supper  feels like needs nebulizer even p pm dose of breztri 02: none rec Change gabapentin to 300 mg 4 x daily  Change pepcid 20 mg after supper  Only use your albuterol as a rescue medication  Bed blocks are strongly recommended    02/26/2021  f/u ov/[facility] re:  COPD  Gold II/ beztri 2bid/ rarely using saba  Chief Complaint  Patient presents with  . Follow-up    Doing well  Dyspnea: 20 min walking in place daily  Cough: no am cough Sleeping: no bed blocks but not resp issues  SABA use: rarely  02: none  Covid status:   vax max    No obvious day to day or daytime variability or assoc excess/ purulent sputum or mucus plugs or hemoptysis or cp or chest tightness, subjective wheeze or overt sinus or hb symptoms.   Sleeping  without nocturnal  or early am exacerbation  of respiratory  c/o's or need for noct saba. Also denies any obvious fluctuation of symptoms with weather or environmental changes or other aggravating or alleviating factors except as outlined above   No unusual exposure hx or h/o childhood pna/ asthma or knowledge of premature birth.  Current Allergies, Complete Past Medical History, Past Surgical History, Family History, and Social History were reviewed in [employer]  record.  ROS  The following are not active complaints unless bolded Hoarseness, sore throat, dysphagia, dental problems, itching, sneezing,  nasal congestion or discharge of excess mucus or purulent secretions, ear ache,   fever, chills, sweats, unintended wt loss or wt gain, classically pleuritic or exertional cp,  orthopnea pnd or arm/hand swelling  or leg swelling, presyncope, palpitations, abdominal pain, anorexia, nausea, vomiting, diarrhea  or change in bowel habits or change in bladder habits, change in stools or change in urine, dysuria, hematuria,  rash, arthralgias, visual complaints, headache, numbness, weakness or ataxia or problems with walking or coordination,  change in mood or  memory.        Current Meds  Medication Sig  . Accu-Chek FastClix Lancets MISC Inject 1 each as directed daily. Use as directed to check blood sugars daily dx: e11.22  . albuterol (VENTOLIN HFA) 108 (90 Base) MCG/ACT inhaler INHALE 2 PUFFS INTO THE LUNGS EVERY 4 HOURS AS NEEDED FOR WHEEZING OR SHORTNESS OF BREATH  . aspirin 81 MG tablet Take 81 mg by mouth daily.  . Biotin 10000 MCG TBDP Take by mouth.  . bisoprolol (ZEBETA) 5 MG tablet TAKE 1/2 TABLET(2.5 MG) BY MOUTH DAILY  . Budeson-Glycopyrrol-Formoterol (BREZTRI AEROSPHERE) 160-9-4.8 MCG/ACT AERO Inhale 2 puffs into the lungs 2 (two) times daily.  Marland Kitchen CALCIUM-MAGNESIUM-ZINC PO Take 1 capsule by mouth daily.  . Cholecalciferol (VITAMIN D3) 2000 UNITS TABS Take 2,000 Units by mouth daily.  . famotidine (PEPCID) 20 MG tablet One after supper  . gabapentin (NEURONTIN) 300 MG capsule Take 1 capsule (300 mg total) by mouth 4 (four) times daily.  Marland Kitchen glucose blood (ACCU-CHEK GUIDE) test strip 1 each by Other route daily. Use as instructed to check blood sugars 1 time per day dx: e11.22  . ipratropium-albuterol (DUONEB) 0.5-2.5 (3) MG/3ML SOLN TAKE 3 MILLILITERS BY NEBULIZATION EVERY 6 HOURS AS NEEDED  . lidocaine (LIDODERM) 5 % Place 3  patches onto the skin daily. Remove & Discard patch within 12 hours or as directed by MD  . Magnesium 250 MG TABS Take 1 tablet by mouth daily.  . montelukast (SINGULAIR) 10 MG tablet TAKE 1 TABLET(10 MG) BY MOUTH AT BEDTIME  . pantoprazole (PROTONIX) 40 MG tablet TAKE 1 TABLET BY MOUTH 30 MINS BEFORE FIRST MEAL OF THE DAY  . rosuvastatin (CRESTOR) 20 MG tablet TAKE 1 TABLET(20 MG) BY MOUTH AT BEDTIME  . Semaglutide, 1 MG/DOSE, (OZEMPIC, 1 MG/DOSE,) 4 MG/3ML SOPN Inject 1 mg into the skin once a week.               Objective:   Physical Exam  02/26/2021      191  11/21/2020   191  04/16/2019      193 07/22/2018      195 04/20/2018      189  10/23/2016     190 04/07/2015         207    Vital signs reviewed  02/26/2021  - Note at rest 02 sats  93% on RA   General appearance:    Pleasant amb bf, min throat clearing now      HEENT : pt wearing mask not removed for exam due to covid - 19 concerns.    NECK :  without JVD/Nodes/TM/ nl carotid upstrokes bilaterally   LUNGS: no acc muscle use,  Mild barrel  contour chest wall with bilateral  Distant bs s audible wheeze and  without cough on insp or exp maneuvers  and mild  Hyperresonant  to  percussion bilaterally     CV:  RRR  no s3 or murmur or increase in P2, and no edema   ABD:  soft and nontender with pos end  insp Hoover's  in the supine position. No bruits or organomegaly appreciated, bowel sounds nl  MS:   Nl gait/  ext warm without deformities, calf tenderness, cyanosis or clubbing No obvious joint restrictions   SKIN: warm and dry without lesions    NEURO:  alert, approp, nl sensorium with  no motor or cerebellar deficits apparent.

## 2021-02-26 NOTE — Assessment & Plan Note (Signed)
Quit smoking 2007 PFT: 03/17/2014-  FEV1  1.28 (58%) ratio 66 and no better p saba, ERV 27 and DLCO 56 corrects to 77 - 11/30/2014   try dulera 100 2bid > 01/03/2015 not clear helping,  use prn > flare since mid 02/2015 cough > sob  - 04/07/2015 p extensive coaching HFA effectiveness =    90% > try dulera 100 2bid > improved 04/21/15   - 06/01/2015   try dulera 200 2bid  > did not maintain  - Spirometry 04/14/2017  FEV1 1.13 (58%)  Ratio 52 p saba w/in 4 h > add symb 80 2bid   - 08/19/2017    resumed sym 80 2bid -  03/23/2018    try symb 160 due to new noct cough/ wheeze > improved 04/20/2018  - 07/22/2018   try symb 80 2bid due to UACS - [facility]/20 rec add spiriva smi > pt declined   Pt is Group B in terms of symptom/risk and laba/lama therefore appropriate rx at this point >>>  Continue breztri 2 bid , if cost an issue can try one bid

## 2021-02-26 NOTE — Patient Instructions (Signed)
Visit Information It was great speaking with you today!  Please let me know if you have any questions about our visit.  Goals Addressed            This Visit's Progress   . Manage My Medicine       Timeframe:  Long-Range Goal Priority:  High Start Date:    02/06/2021                          Expected End Date:  02/06/2022                       - call for medicine refill 2 or 3 days before it runs out - call if I am sick and can't take my medicine - keep a list of all the medicines I take; vitamins and herbals too - use a pillbox to sort medicine    Why is this important?   . These steps will help you keep on track with your medicines.           Patient Care Plan: CCM Pharmacy Care Plan    Problem Identified: HTN, HLD, DM II   Priority: High    Long-Range Goal: Disease Management   Start Date: 02/06/2021  This Visit's Progress: On track  Priority: High  Note:    Current Barriers:  . Unable to achieve control of cholesterol    Pharmacist Clinical Goal(s):  Marland Kitchen Over the next 90 days, patient will achieve adherence to monitoring guidelines and medication adherence to achieve therapeutic efficacy through collaboration with PharmD and provider.    Interventions: . 1:1 collaboration with [name], MD regarding development and update of comprehensive plan of care as evidenced by provider attestation and co-signature . Inter-disciplinary care team collaboration (see longitudinal plan of care) . Comprehensive medication review performed; medication list updated in electronic medical record  Hypertension (BP goal <130/80) -controlled -Current treatment:  . Bisoprolol take a 1/2 tablet by mouth daily.  -Current home readings:Patient is not checking her BP at home.  -Current dietary habits: Patient reports that she has been eating more vegetables in the last 3-4 weeks. She reports that she is cooking a balanced meal.  -Current exercise habits: she is using youtube to  exercise using a walking video and she is also doing the chair exercises. She reports doing 20 minutes a day Monday through Friday.  -Denies hypotensive/hypertensive symptoms -Educated on Daily salt intake goal < 2300 mg; Exercise goal of 150 minutes per week; Importance of home blood pressure monitoring; -Counseled to monitor BP at home once a week, document, and provide log at future appointments -Recommended to continue current medication  Hyperlipidemia: (LDL goal < 70) -uncontrolled -Current treatment: . Crestor 20 mg tablet daily -Current dietary patterns: patient reports that her daughter and husband who are overweight so she is trying to work on eating more healthy  -Educated on Cholesterol goals;  Benefits of statin for ASCVD risk reduction; Importance of limiting foods high in cholesterol; Exercise goal of 150 minutes per week; -Counseled on diet and exercise extensively Recommended to continue current medication  Diabetes (A1c goal <7%) -controlled -Current medications: . Ozempic 1 mg - taking once a week  -Current home glucose readings - checking once a week, pt reports that it takes time to check her BS as a finger prick  . fasting glucose: 90 -97  -Denies hypoglycemic/hyperglycemic symptoms -Current meal patterns:  . dinner: Pt  reports her meal is well balanced  . drinks: she is increasing the amount of water she is drinking, 1/2 gallon of water. Every once in a while she will have sweet tea with dinner. She reports that she is drinking 100% Welch's Grape juice everyday, she keeps it by her bedside it is 10 ounces and she does not drink it all at night.  -Current exercise: 20 minutes per day 5 days a week.  -Educated onA1c and blood sugar goals; Exercise goal of 150 minutes per week; Benefits of routine self-monitoring of blood sugar; Continuous glucose monitoring; Carbohydrate counting and/or plate method -Counseled to check feet daily and get yearly eye exams,  patient requested more details about her feet and how to check them    Avoid going barefoot, even at home, and especially on hot decks and hot sand  Test water temperature before stepping into a bath  Trim toenails to shape of the toe, and remove sharp edges with a nail file; do not cut cuticles  Wash in lukewarm water, dry thoroughly (including between the toes), and check feet daily  Shoes should be snug, but not tight, and customized if feet are misshapen or have ulcers  Socks should fit and be changed daily -Recommended to continue current medication   Patient Goals/Self-Care Activities . Over the next 90 days, patient will:  - take medications as prescribed check glucose at least once per week , document, and provide at future appointments target a minimum of 150 minutes of moderate intensity exercise weekly  Follow Up Plan: Telephone follow up appointment with care management team member scheduled for: 05/08/2021        Patient agreed to services and verbal consent obtained.   The patient verbalized understanding of instructions, educational materials, and care plan provided today and agreed to receive a mailed copy of patient instructions, educational materials, and care plan.   [name], PharmD Clinical Pharmacist [facility] [phone]

## 2021-02-26 NOTE — Chronic Care Management (AMB) (Signed)
  Care Management   Note  02/26/2021 Name: [name] MRN: [id number] DOB: 1946-05-17  [name] is a 75 y.o. year old female who is a primary care patient of [name], MD and is actively engaged with the care management team. Patient [name] reached out by phone today to assist with scheduling a follow up visit with the Pharmacist  Follow up plan: Telephone appointment with care management team member scheduled for:02/27/2021  [place] [facility]

## 2021-02-27 ENCOUNTER — Other Ambulatory Visit: Payer: Self-pay | Admitting: Internal Medicine

## 2021-02-27 ENCOUNTER — Telehealth: Payer: Medicare Other

## 2021-02-27 ENCOUNTER — Ambulatory Visit (INDEPENDENT_AMBULATORY_CARE_PROVIDER_SITE_OTHER): Payer: Medicare Other

## 2021-02-27 DIAGNOSIS — I10 Essential (primary) hypertension: Secondary | ICD-10-CM | POA: Diagnosis not present

## 2021-02-27 DIAGNOSIS — J441 Chronic obstructive pulmonary disease with (acute) exacerbation: Secondary | ICD-10-CM | POA: Diagnosis not present

## 2021-02-27 DIAGNOSIS — E1122 Type 2 diabetes mellitus with diabetic chronic kidney disease: Secondary | ICD-10-CM

## 2021-02-27 DIAGNOSIS — N182 Chronic kidney disease, stage 2 (mild): Secondary | ICD-10-CM | POA: Diagnosis not present

## 2021-02-27 NOTE — Progress Notes (Signed)
Chronic Care Management Pharmacy Note  02/27/2021 Name:  [name] MRN:  [id number] DOB:  Dec 25, 1946  Subjective: [name] is an 75 y.o. year old female who is a primary patient of [name], MD.  The CCM team was consulted for assistance with disease management and care coordination needs.    Engaged with patient by telephone for follow up visit in response to provider referral for pharmacy case management and/or care coordination services.   Consent to Services:  The patient was given information about Chronic Care Management services, agreed to services, and gave verbal consent prior to initiation of services.  Please see initial visit note for detailed documentation.   Patient Care Team: [name], MD as PCP - General (Internal Medicine) Lynne Logan, RN as Case Manager Mayford Knife, [facility] (Pharmacist)  Recent office visits: 01/04/2021 PCP OV  Recent consult visits:  02/26/2021 Pulmonology OV 12/6/20214 [facility] visits: None in previous 6 months  Objective:  Lab Results  Component Value Date   CREATININE 0.83 01/04/2021   BUN 8 01/04/2021   GFRNONAA 70 01/04/2021   GFRAA 81 01/04/2021   NA 142 01/04/2021   K 5.1 01/04/2021   CALCIUM 9.7 01/04/2021   CO2 27 01/04/2021    Lab Results  Component Value Date/Time   HGBA1C 6.3 (H) 01/04/2021 02:50 PM   HGBA1C 6.3 (H) 09/06/2020 02:25 PM   MICROALBUR 10 09/06/2020 12:54 PM   MICROALBUR 10 09/01/2019 02:42 PM    Last diabetic Eye exam:  Lab Results  Component Value Date/Time   HMDIABEYEEXA No Retinopathy 12/04/2020 12:00 AM    Last diabetic Foot exam: No results found for: HMDIABFOOTEX   Lab Results  Component Value Date   CHOL 175 01/04/2021   HDL 62 01/04/2021   LDLCALC 88 01/04/2021   TRIG 145 01/04/2021   CHOLHDL 2.8 01/04/2021    Hepatic Function Latest Ref Rng & Units 01/04/2021 05/04/2020 09/01/2019  Total Protein 6.0 - 8.5 g/dL 7.0 7.1 7.7  Albumin 3.7 -  4.7 g/dL 4.2 4.6 4.7  AST 0 - 40 IU/L 33 34 22  ALT 0 - 32 IU/L '27 21 11  ' Alk Phosphatase 44 - 121 IU/L 70 74 70  Total Bilirubin 0.0 - 1.2 mg/dL 0.4 0.2 0.4    Lab Results  Component Value Date/Time   TSH 1.400 01/04/2020 10:57 AM   TSH 0.88 07/29/2018 12:00 AM    CBC Latest Ref Rng & Units 10/16/2020 09/06/2020 09/01/2019  WBC 4.0 - 10.5 K/uL 6.0 6.2 7.0  Hemoglobin 12.0 - 15.0 g/dL 13.8 14.2 13.5  Hematocrit 36.0 - 46.0 % 41.2 45.0 41.8  Platelets 150.0 - 400.0 K/uL 399.0 373 307    Lab Results  Component Value Date/Time   VD25OH 33.1 09/06/2020 02:25 PM   VD25OH 39.9 07/29/2018 12:00 AM    Clinical ASCVD: No  The ASCVD Risk score ([place]., et al., 2013) failed to calculate for the following reasons:   Unable to determine if patient is Non-Hispanic African American    Depression screen [facility] 2/9 09/06/2020 09/01/2019 07/01/2019  Decreased Interest 0 0 0  Down, Depressed, Hopeless 0 3 0  PHQ - 2 Score 0 3 0  Altered sleeping - 0 -  Tired, decreased energy - 0 -  Change in appetite - 0 -  Feeling bad or failure about yourself  - 0 -  Trouble concentrating - 0 -  Moving slowly or fidgety/restless - 0 -  Suicidal thoughts - 0 -  PHQ-9 Score - 3 -  Difficult doing work/chores - Not difficult at all -  Some recent data might be hidden    Pulmonary Functions Testing Results:  TLC  Date Value Ref Range Status  03/17/2014 4.57 L Final     Social History   Tobacco Use  Smoking Status Former Smoker  . Packs/day: 0.25  . Years: 42.00  . Pack years: 10.50  . Types: Cigarettes  . Quit date: 01/30/2006  . Years since quitting: 15.0  Smokeless Tobacco Never Used   BP Readings from Last 3 Encounters:  02/26/21 126/74  01/04/21 118/68  11/21/20 130/76   Pulse Readings from Last 3 Encounters:  02/26/21 82  01/04/21 66  11/21/20 70   Wt Readings from Last 3 Encounters:  02/26/21 191 lb 12.8 oz (87 kg)  01/04/21 190 lb 12.8 oz (86.5 kg)  11/21/20 191 lb (86.6 kg)     Assessment/Interventions: Review of patient past medical history, allergies, medications, health status, including review of consultants reports, laboratory and other test data, was performed as part of comprehensive evaluation and provision of chronic care management services.   SDOH:  (Social Determinants of Health) assessments and interventions performed: Yes   CCM Care Plan  Allergies  Allergen Reactions  . Other Itching    HAIR DYE    Medications Reviewed Today    Reviewed by Mayford Knife, [facility] (Pharmacist) on 02/27/21 at 1523  Med List Status: <None>  Medication Order Taking? Sig Documenting Provider Last Dose Status Informant  Accu-Chek FastClix Lancets MISC [id number] Yes Inject 1 each as directed daily. Use as directed to check blood sugars daily dx: e11.22 [name], MD Taking Active   albuterol (VENTOLIN HFA) 108 [phone] Base) MCG/ACT inhaler [id number] Yes INHALE 2 PUFFS INTO THE LUNGS EVERY 4 HOURS AS NEEDED FOR WHEEZING OR SHORTNESS OF Raphael Gibney, MD Taking Active   aspirin 81 MG tablet [id number] Yes Take 81 mg by mouth daily. [provider] Taking Active Self  Biotin 10000 MCG TBDP [id number] Yes Take by mouth. [provider] Taking Active   bisoprolol (ZEBETA) 5 MG tablet [id number] Yes TAKE 1/2 TABLET(2.5 MG) BY MOUTH DAILY [name], MD Taking Active   Budeson-Glycopyrrol-Formoterol (BREZTRI AEROSPHERE) 160-9-4.8 MCG/ACT AERO [id number] Yes Inhale 2 puffs into the lungs 2 (two) times daily. Tanda Rockers, MD Taking Active   Budeson-Glycopyrrol-Formoterol (BREZTRI AEROSPHERE) 160-9-4.8 MCG/ACT Hollie Salk [id number] Yes Inhale 2 puffs into the lungs 2 (two) times daily. Tanda Rockers, MD Taking Active   Budeson-Glycopyrrol-Formoterol (BREZTRI AEROSPHERE) 160-9-4.8 MCG/ACT Hollie Salk [id number] Yes Inhale 2 puffs into the lungs 2 (two) times daily. Tanda Rockers, MD Taking Active   CALCIUM-MAGNESIUM-ZINC PO [id number] Yes Take 1 capsule by mouth  daily. [provider] Taking Active   Cholecalciferol (VITAMIN D3) 2000 UNITS TABS [id number] Yes Take 2,000 Units by mouth daily. [provider] Taking Active Self  famotidine (PEPCID) 20 MG tablet [id number] Yes One after supper Tanda Rockers, MD Taking Active   gabapentin (NEURONTIN) 300 MG capsule [id number] Yes TAKE 1 CAPSULE(300 MG) BY MOUTH FOUR TIMES DAILY Tanda Rockers, MD Taking Active   glucose blood (ACCU-CHEK GUIDE) test strip [id number] Yes 1 each by Other route daily. Use as instructed to check blood sugars 1 time per day dx: e11.22 [name], MD Taking Active   ipratropium-albuterol (DUONEB) 0.5-2.5 (3) MG/3ML SOLN [id number] Yes TAKE 3 MILLILITERS BY NEBULIZATION EVERY 6 HOURS AS NEEDED [name],  MD Taking Active   lidocaine (LIDODERM) 5 % [id number] Yes Place 3 patches onto the skin daily. Remove & Discard patch within 12 hours or as directed by MD [name], MD Taking Active   Magnesium 250 MG TABS [id number] Yes Take 1 tablet by mouth daily. [provider] Taking Active   montelukast (SINGULAIR) 10 MG tablet [id number] Yes TAKE 1 TABLET(10 MG) BY MOUTH AT BEDTIME Tanda Rockers, MD Taking Active   pantoprazole (PROTONIX) 40 MG tablet [id number] Yes TAKE 1 TABLET BY MOUTH 30 MINS BEFORE FIRST MEAL OF THE Lynnell Dike, MD Taking Active   rosuvastatin (CRESTOR) 20 MG tablet [id number] Yes TAKE 1 TABLET(20 MG) BY MOUTH AT BEDTIME [name], MD Taking Active   Semaglutide, 1 MG/DOSE, (OZEMPIC, 1 MG/DOSE,) 4 MG/3ML SOPN [id number] Yes Inject 1 mg into the skin once a week. [name], MD Taking Active           Patient Active Problem List   Diagnosis Date Noted  . Former smoker 08/21/2020  . High cholesterol   . Type II diabetes mellitus, uncontrolled ([place]) 11/09/2018  . Diarrhea   . Community acquired pneumonia of left lower lobe of lung   . Rhinovirus infection 10/02/2016  . Acute asthma exacerbation 10/02/2016  . COPD  with acute exacerbation ([place]) 09/28/2016  . Hypoxia 09/28/2016  . SOB (shortness of breath) 09/28/2016  . COPD exacerbation ([place]) 09/28/2016  . Essential hypertension 11/03/2014  . Cough 10/19/2014  . COPD GOLD II  03/21/2014  . Rhinitis, nonallergic 03/21/2014    Immunization History  Administered Date(s) Administered  . Fluad Quad(high Dose 65+) 09/06/2020  . Influenza Split 09/29/2013, 09/30/2015  . Influenza Whole 08/30/2016  . Influenza, High Dose Seasonal PF 09/29/2017, 09/14/2018, 09/01/2019  . Influenza,inj,Quad PF,6+ Mos 09/12/2014  . Influenza-Unspecified 09/14/2018  . PFIZER(Purple Top)SARS-COV-2 Vaccination 01/19/2020, 02/09/2020, 12/21/2020  . Pneumococcal Conjugate-13 01/06/2020  . Pneumococcal Polysaccharide-23 10/07/2013  . Pneumococcal-Unspecified 10/05/2013  . Tdap 06/07/2019, 06/09/2019    Conditions to be addressed/monitored:  Hypertension, Hyperlipidemia, Diabetes and COPD  Care Plan : [place]  Updates made by Mayford Knife, RPH since 02/27/2021 12:00 AM    Problem: HTN, HLD, DM II, COPD   Priority: High    Long-Range Goal: Disease Management   Start Date: 02/06/2021  Recent Progress: On track  Priority: High  Note:    Current Barriers:  . Unable to achieve control of cholesterol    Pharmacist Clinical Goal(s):  Marland Kitchen Over the next 90 days, patient will achieve adherence to monitoring guidelines and medication adherence to achieve therapeutic efficacy through collaboration with PharmD and provider.    Interventions: . 1:1 collaboration with [name], MD regarding development and update of comprehensive plan of care as evidenced by provider attestation and co-signature . Inter-disciplinary care team collaboration (see longitudinal plan of care) . Comprehensive medication review performed; medication list updated in electronic medical record  Hypertension (BP goal <130/80) -controlled -Current treatment:  . Bisoprolol take a  1/2 tablet by mouth daily.  -Current home readings:Patient is not checking her BP at home.  -Current dietary habits: Patient reports that she has been eating more vegetables in the last 3-4 weeks. She reports that she is cooking a balanced meal.  -Current exercise habits: she is using youtube to exercise using a walking video and she is also doing the chair exercises. She reports doing 20 minutes a day Monday through Friday.  -Denies hypotensive/hypertensive symptoms -Educated on Daily salt intake  goal < 2300 mg; Exercise goal of 150 minutes per week; Importance of home blood pressure monitoring; -Counseled to monitor BP at home once a week, document, and provide log at future appointments -Recommended to continue current medication  Hyperlipidemia: (LDL goal < 70) -uncontrolled -Current treatment: . Crestor 20 mg tablet daily -Current dietary patterns: patient reports that her daughter and husband who are overweight so she is trying to work on eating more healthy  -Educated on Cholesterol goals;  Benefits of statin for ASCVD risk reduction; Importance of limiting foods high in cholesterol; Exercise goal of 150 minutes per week; -Counseled on diet and exercise extensively Recommended to continue current medication  Diabetes (A1c goal <7%) -controlled -Current medications: . Ozempic 1 mg - taking once a week  -Current home glucose readings - checking once a week, pt reports that it takes time to check her BS as a finger prick  . fasting glucose: 90 -97  -Denies hypoglycemic/hyperglycemic symptoms -Current meal patterns:  . dinner: Pt reports her meal is well balanced  . drinks: she is increasing the amount of water she is drinking, 1/2 gallon of water. Every once in a while she will have sweet tea with dinner. She reports that she is drinking 100% Welch's Grape juice everyday, she keeps it by her bedside it is 10 ounces and she does not drink it all at night.  -Current exercise: 20  minutes per day 5 days a week.  -Educated onA1c and blood sugar goals; Exercise goal of 150 minutes per week; Benefits of routine self-monitoring of blood sugar; Continuous glucose monitoring; Carbohydrate counting and/or plate method -Counseled to check feet daily and get yearly eye exams, patient requested more details about her feet and how to check them    Avoid going barefoot, even at home, and especially on hot decks and hot sand  Test water temperature before stepping into a bath  Trim toenails to shape of the toe, and remove sharp edges with a nail file; do not cut cuticles  Wash in lukewarm water, dry thoroughly (including between the toes), and check feet daily  Shoes should be snug, but not tight, and customized if feet are misshapen or have ulcers  Socks should fit and be changed daily -Recommended to continue current medication  COPD (Goal: control symptoms and prevent exacerbations) -controlled -Current treatment  . Breztri 160-9-4.8 MCG - inhale 2 puffs into the lungs two times daily. o Dr. [name] gave her 2 samples inhalers that will last week 2 weeks.  -Gold Grade: Gold 2 (FEV1 50-79%) -Co-managed by Dr. Baird Cancer and Dr. [name] -Patient denies consistent use of maintenance inhaler -Frequency of rescue inhaler use: not using often  -Counseled on Proper inhaler technique; Benefits of consistent maintenance inhaler use Collaborated with PCP to have patient assistance paperwork MD portion to be completed by Dr. Baird Cancer to expedite the process. Patient agreed to fill one month supply of Breztri at the pharmacy and while pending application approval.  -Recommended to continue current medication   Patient Goals/Self-Care Activities . Over the next 60 days, patient will:  - take medications as prescribed collaborate with provider on medication access solutions  Follow Up Plan: Telephone follow up appointment with care management team member scheduled for: 05/08/2021 The  patient has been provided with contact information for the care management team and has been advised to call with any health related questions or concerns.          Medication Assistance: Application for Breztri  medication  assistance program. in process.  Anticipated assistance start date 03/31/2021.  See plan of care for additional detail.  Patient's preferred pharmacy is:  [id number] [address], [place] [address] [place] [address] Phone: [phone] Fax: [phone]  Uses pill box? Yes Pt endorses 100% compliance  We discussed: Benefits of medication synchronization, packaging and delivery as well as enhanced pharmacist oversight with [employer]. Patient decided to: Continue current medication management strategy  Care Plan and Follow Up Patient Decision:  Patient agrees to Care Plan and Follow-up.  Plan: Telephone follow up appointment with care management team member scheduled for:  05/13/2021 and The patient has been provided with contact information for the care management team and has been advised to call with any health related questions or concerns.  Collaborated with PCP for patient assistance to to be completed for [facility] and sent to [employer].   [name], PharmD Clinical Pharmacist [facility] [phone]

## 2021-02-27 NOTE — Patient Instructions (Signed)
Visit Information It was great speaking with you today!  Please let me know if you have any questions about our visit.  Goals Addressed   None     Patient Care Plan: CCM Pharmacy Care Plan    Problem Identified: HTN, HLD, DM II, COPD   Priority: High    Long-Range Goal: Disease Management   Start Date: 02/06/2021  Recent Progress: On track  Priority: High  Note:    Current Barriers:  . Unable to achieve control of cholesterol    Pharmacist Clinical Goal(s):  Marland Kitchen Over the next 90 days, patient will achieve adherence to monitoring guidelines and medication adherence to achieve therapeutic efficacy through collaboration with PharmD and provider.    Interventions: . 1:1 collaboration with [name], MD regarding development and update of comprehensive plan of care as evidenced by provider attestation and co-signature . Inter-disciplinary care team collaboration (see longitudinal plan of care) . Comprehensive medication review performed; medication list updated in electronic medical record  Hypertension (BP goal <130/80) -controlled -Current treatment:  . Bisoprolol take a 1/2 tablet by mouth daily.  -Current home readings:Patient is not checking her BP at home.  -Current dietary habits: Patient reports that she has been eating more vegetables in the last 3-4 weeks. She reports that she is cooking a balanced meal.  -Current exercise habits: she is using youtube to exercise using a walking video and she is also doing the chair exercises. She reports doing 20 minutes a day Monday through Friday.  -Denies hypotensive/hypertensive symptoms -Educated on Daily salt intake goal < 2300 mg; Exercise goal of 150 minutes per week; Importance of home blood pressure monitoring; -Counseled to monitor BP at home once a week, document, and provide log at future appointments -Recommended to continue current medication  Hyperlipidemia: (LDL goal < 70) -uncontrolled -Current  treatment: . Crestor 20 mg tablet daily -Current dietary patterns: patient reports that her daughter and husband who are overweight so she is trying to work on eating more healthy  -Educated on Cholesterol goals;  Benefits of statin for ASCVD risk reduction; Importance of limiting foods high in cholesterol; Exercise goal of 150 minutes per week; -Counseled on diet and exercise extensively Recommended to continue current medication  Diabetes (A1c goal <7%) -controlled -Current medications: . Ozempic 1 mg - taking once a week  -Current home glucose readings - checking once a week, pt reports that it takes time to check her BS as a finger prick  . fasting glucose: 90 -97  -Denies hypoglycemic/hyperglycemic symptoms -Current meal patterns:  . dinner: Pt reports her meal is well balanced  . drinks: she is increasing the amount of water she is drinking, 1/2 gallon of water. Every once in a while she will have sweet tea with dinner. She reports that she is drinking 100% Welch's Grape juice everyday, she keeps it by her bedside it is 10 ounces and she does not drink it all at night.  -Current exercise: 20 minutes per day 5 days a week.  -Educated onA1c and blood sugar goals; Exercise goal of 150 minutes per week; Benefits of routine self-monitoring of blood sugar; Continuous glucose monitoring; Carbohydrate counting and/or plate method -Counseled to check feet daily and get yearly eye exams, patient requested more details about her feet and how to check them    Avoid going barefoot, even at home, and especially on hot decks and hot sand  Test water temperature before stepping into a bath  Trim toenails to shape of the  toe, and remove sharp edges with a nail file; do not cut cuticles  Wash in lukewarm water, dry thoroughly (including between the toes), and check feet daily  Shoes should be snug, but not tight, and customized if feet are misshapen or have ulcers  Socks should fit and be  changed daily -Recommended to continue current medication  COPD (Goal: control symptoms and prevent exacerbations) -controlled -Current treatment  . Breztri 160-9-4.8 MCG - inhale 2 puffs into the lungs two times daily. o Dr. [name] gave her 2 samples inhalers that will last week 2 weeks.  -Gold Grade: Gold 2 (FEV1 50-79%) -Co-managed by Dr. Baird Cancer and Dr. [name] -Patient denies consistent use of maintenance inhaler -Frequency of rescue inhaler use: not using often  -Counseled on Proper inhaler technique; Benefits of consistent maintenance inhaler use Collaborated with PCP to have patient assistance paperwork MD portion to be completed by Dr. Baird Cancer to expedite the process. Patient agreed to fill one month supply of Breztri at the pharmacy and while pending application approval.  -Recommended to continue current medication   Patient Goals/Self-Care Activities . Over the next 60 days, patient will:  - take medications as prescribed collaborate with provider on medication access solutions  Follow Up Plan: Telephone follow up appointment with care management team member scheduled for: 05/08/2021 The patient has been provided with contact information for the care management team and has been advised to call with any health related questions or concerns.          Patient agreed to services and verbal consent obtained.   Print copy of patient instructions, educational materials, and care plan provided in person.  [name], PharmD Clinical Pharmacist [facility] [phone]

## 2021-03-15 ENCOUNTER — Telehealth: Payer: Self-pay | Admitting: Internal Medicine

## 2021-03-15 MED ORDER — BREZTRI AEROSPHERE 160-9-4.8 MCG/ACT IN AERO: 2.0000 | INHALATION_SPRAY | Freq: Two times a day (BID) | RESPIRATORY_TRACT | 6 refills | Status: DC

## 2021-03-15 NOTE — Telephone Encounter (Signed)
Call returned to patient, confirmed DOB. Confirmed medication and pharmacy. Refill sent in.   Nothing further needed at this time.

## 2021-03-20 ENCOUNTER — Telehealth: Payer: Self-pay

## 2021-03-20 NOTE — Chronic Care Management (AMB) (Signed)
Chronic Care Management Pharmacy Assistant   Name: [name]  MRN: [id number] DOB: September 27, 1946   Reason for Encounter: Patient Assistance Coordination      Medications: Outpatient Encounter Medications as of 03/20/2021  Medication Sig  . Accu-Chek FastClix Lancets MISC Inject 1 each as directed daily. Use as directed to check blood sugars daily dx: e11.22  . albuterol (VENTOLIN HFA) 108 (90 Base) MCG/ACT inhaler INHALE 2 PUFFS INTO THE LUNGS EVERY 4 HOURS AS NEEDED FOR WHEEZING OR SHORTNESS OF BREATH  . aspirin 81 MG tablet Take 81 mg by mouth daily.  . Biotin 10000 MCG TBDP Take by mouth.  . bisoprolol (ZEBETA) 5 MG tablet TAKE 1/2 TABLET(2.5 MG) BY MOUTH DAILY  . Budeson-Glycopyrrol-Formoterol (BREZTRI AEROSPHERE) 160-9-4.8 MCG/ACT AERO Inhale 2 puffs into the lungs 2 (two) times daily.  . Budeson-Glycopyrrol-Formoterol (BREZTRI AEROSPHERE) 160-9-4.8 MCG/ACT AERO Inhale 2 puffs into the lungs 2 (two) times daily.  . Budeson-Glycopyrrol-Formoterol (BREZTRI AEROSPHERE) 160-9-4.8 MCG/ACT AERO Inhale 2 puffs into the lungs 2 (two) times daily.  Marland Kitchen CALCIUM-MAGNESIUM-ZINC PO Take 1 capsule by mouth daily.  . Cholecalciferol (VITAMIN D3) 2000 UNITS TABS Take 2,000 Units by mouth daily.  . famotidine (PEPCID) 20 MG tablet One after supper  . gabapentin (NEURONTIN) 300 MG capsule TAKE 1 CAPSULE(300 MG) BY MOUTH FOUR TIMES DAILY  . glucose blood (ACCU-CHEK GUIDE) test strip 1 each by Other route daily. Use as instructed to check blood sugars 1 time per day dx: e11.22  . ipratropium-albuterol (DUONEB) 0.5-2.5 (3) MG/3ML SOLN TAKE 3 MILLILITERS BY NEBULIZATION EVERY 6 HOURS AS NEEDED  . lidocaine (LIDODERM) 5 % Place 3 patches onto the skin daily. Remove & Discard patch within 12 hours or as directed by MD  . Magnesium 250 MG TABS Take 1 tablet by mouth daily.  . montelukast (SINGULAIR) 10 MG tablet TAKE 1 TABLET(10 MG) BY MOUTH AT BEDTIME  . pantoprazole (PROTONIX) 40 MG tablet TAKE 1  TABLET BY MOUTH 30 MINS BEFORE FIRST MEAL OF THE DAY  . rosuvastatin (CRESTOR) 20 MG tablet TAKE 1 TABLET(20 MG) BY MOUTH AT BEDTIME  . Semaglutide, 1 MG/DOSE, (OZEMPIC, 1 MG/DOSE,) 4 MG/3ML SOPN Inject 1 mg into the skin once a week.   No facility-administered encounter medications on file as of 03/20/2021.    03/20/21-Called and spoke with the patient regarding her [place] Breztri application. The patient voiced she would like to know the status on the application and if the Prescriber's portion has been filled out by PCP as discussed in her last CCM follow-up visit on 02/27/21 with [name], CPP. I told the patient I will need to reach out to my CCM team on this matter and get back with her on an updated answer. The patient verbalized understanding.   Message sent to [name], CPP and Tamala. Per Felicity Coyer; application was filled out and faxed to manufacturer last week 3/14-3/18.   Called [employer] to follow-up regarding [name] Insurance claims handler. Per representative; patient's application was received 03/01/21 however the patient's [facility] ID# was not listed on the form and that is the only information needed at this time for the patient's application to be processed and awaiting approval. I was able to locate and provide updated [employer] insurance card information to the representative as requested over phone call. [employer] representative asked if I can be placed on hold to process information for approval.  Hold time time: 15 minutes. [employer] representative returned to phone  call stating the patient's application has been approved, starting from 12/30/20 to 12/29/21. Patient will receive approval letter in the mail, provider will receive approval letter by fax. The patient should receive Breztri 160-9-4.8 MCG/ACT Aero Medication 6 month supply (3 refills) at her residential address within 7 to 75 business days.   Called the patient to notify her that I was able  to call and follow-up with the Manufacturer in regards of her [facility] application. I informed the patient application was on hold after they have received because her [place] ID# which I was able to provide over the phone with the representative and was approved once they processed. The patient asked "Do you have my updated [employer] insurance card information because it has changed?:" I verbalized to the patient I used her most recent updated card information that was scanned in her chart dated on 02/26/2021. I also informed her again that she has been approved and should expect shipment from [employer] within 7 to 12 business days at her residential address as requested. The patient voiced understanding and voiced she is greatly thankful.  [name], CPP Notified.  Raynelle Highland, [place] Pharmacist Assistant [phone] CCM Total Time: 44 minutes ([employer] call time:42 miuntes)

## 2021-04-04 ENCOUNTER — Other Ambulatory Visit: Payer: Self-pay

## 2021-04-04 ENCOUNTER — Encounter: Payer: Self-pay | Admitting: Internal Medicine

## 2021-04-04 ENCOUNTER — Ambulatory Visit (INDEPENDENT_AMBULATORY_CARE_PROVIDER_SITE_OTHER): Payer: Medicare Other | Admitting: Internal Medicine

## 2021-04-04 VITALS — BP 116/68 | HR 76 | Temp 98.1°F | Ht 65.0 in | Wt 193.2 lb

## 2021-04-04 DIAGNOSIS — Z6832 Body mass index (BMI) 32.0-32.9, adult: Secondary | ICD-10-CM

## 2021-04-04 DIAGNOSIS — E6609 Other obesity due to excess calories: Secondary | ICD-10-CM | POA: Diagnosis not present

## 2021-04-04 DIAGNOSIS — I10 Essential (primary) hypertension: Secondary | ICD-10-CM

## 2021-04-04 DIAGNOSIS — J449 Chronic obstructive pulmonary disease, unspecified: Secondary | ICD-10-CM

## 2021-04-04 DIAGNOSIS — E1165 Type 2 diabetes mellitus with hyperglycemia: Secondary | ICD-10-CM

## 2021-04-04 MED ORDER — MONTELUKAST SODIUM 10 MG PO TABS: ORAL_TABLET | ORAL | 2 refills | Status: DC

## 2021-04-04 MED ORDER — ALBUTEROL SULFATE HFA 108 (90 BASE) MCG/ACT IN AERS: 2.0000 | INHALATION_SPRAY | RESPIRATORY_TRACT | 12 refills | Status: DC | PRN

## 2021-04-04 NOTE — Patient Instructions (Signed)
Diabetes Mellitus and Foot Care Foot care is an important part of your health, especially when you have diabetes. Diabetes may cause you to have problems because of poor blood flow (circulation) to your feet and legs, which can cause your skin to:  Become thinner and drier.  Break more easily.  Heal more slowly.  Peel and crack. You may also have nerve damage (neuropathy) in your legs and feet, causing decreased feeling in them. This means that you may not notice minor injuries to your feet that could lead to more serious problems. Noticing and addressing any potential problems early is the best way to prevent future foot problems. How to care for your feet Foot hygiene  Wash your feet daily with warm water and mild soap. Do not use hot water. Then, pat your feet and the areas between your toes until they are completely dry. Do not soak your feet as this can dry your skin.  Trim your toenails straight across. Do not dig under them or around the cuticle. File the edges of your nails with an emery board or nail file.  Apply a moisturizing lotion or petroleum jelly to the skin on your feet and to dry, brittle toenails. Use lotion that does not contain alcohol and is unscented. Do not apply lotion between your toes.   Shoes and socks  Wear clean socks or stockings every day. Make sure they are not too tight. Do not wear knee-high stockings since they may decrease blood flow to your legs.  Wear shoes that fit properly and have enough cushioning. Always look in your shoes before you put them on to be sure there are no objects inside.  To break in new shoes, wear them for just a few hours a day. This prevents injuries on your feet. Wounds, scrapes, corns, and calluses  Check your feet daily for blisters, cuts, bruises, sores, and redness. If you cannot see the bottom of your feet, use a mirror or ask someone for help.  Do not cut corns or calluses or try to remove them with medicine.  If you  find a minor scrape, cut, or break in the skin on your feet, keep it and the skin around it clean and dry. You may clean these areas with mild soap and water. Do not clean the area with peroxide, alcohol, or iodine.  If you have a wound, scrape, corn, or callus on your foot, look at it several times a day to make sure it is healing and not infected. Check for: ? Redness, swelling, or pain. ? Fluid or blood. ? Warmth. ? Pus or a bad smell.   General tips  Do not cross your legs. This may decrease blood flow to your feet.  Do not use heating pads or hot water bottles on your feet. They may burn your skin. If you have lost feeling in your feet or legs, you may not know this is happening until it is too late.  Protect your feet from hot and cold by wearing shoes, such as at the beach or on hot pavement.  Schedule a complete foot exam at least once a year (annually) or more often if you have foot problems. Report any cuts, sores, or bruises to your health care provider immediately. Where to find more information  American Diabetes Association: www.diabetes.org  Association of Diabetes Care & Education Specialists: www.diabeteseducator.org Contact a health care provider if:  You have a medical condition that increases your risk of infection and   you have any cuts, sores, or bruises on your feet.  You have an injury that is not healing.  You have redness on your legs or feet.  You feel burning or tingling in your legs or feet.  You have pain or cramps in your legs and feet.  Your legs or feet are numb.  Your feet always feel cold.  You have pain around any toenails. Get help right away if:  You have a wound, scrape, corn, or callus on your foot and: ? You have pain, swelling, or redness that gets worse. ? You have fluid or blood coming from the wound, scrape, corn, or callus. ? Your wound, scrape, corn, or callus feels warm to the touch. ? You have pus or a bad smell coming from  the wound, scrape, corn, or callus. ? You have a fever. ? You have a red line going up your leg. Summary  Check your feet every day for blisters, cuts, bruises, sores, and redness.  Apply a moisturizing lotion or petroleum jelly to the skin on your feet and to dry, brittle toenails.  Wear shoes that fit properly and have enough cushioning.  If you have foot problems, report any cuts, sores, or bruises to your health care provider immediately.  Schedule a complete foot exam at least once a year (annually) or more often if you have foot problems. This information is not intended to replace advice given to you by your health care provider. Make sure you discuss any questions you have with your health care provider. Document Revised: 07/06/2020 Document Reviewed: 07/06/2020 Elsevier Patient Education  2021 Elsevier Inc.  

## 2021-04-04 NOTE — Progress Notes (Signed)
I,[name],acting as a Education administrator for [name], MD.,have documented all relevant documentation on the behalf of [name], MD,as directed by  [name], MD while in the presence of [name], MD.  This visit occurred during the SARS-CoV-2 public health emergency.  Safety protocols were in place, including screening questions prior to the visit, additional usage of staff PPE, and extensive cleaning of exam room while observing appropriate contact time as indicated for disinfecting solutions.  Subjective:     Patient ID: [name] , female    DOB: [birth year] , 75 y.o.   MRN: [id number]   Chief Complaint  Patient presents with  . Diabetes  . Hypertension    HPI  Pt is here today for diabetes and HTN f/u. She reports compliance with meds. She denies headaches, chest pain and shortness of breath.   Diabetes She presents for her follow-up diabetic visit. She has type 2 diabetes mellitus. Her disease course has been stable. There are no hypoglycemic associated symptoms. Pertinent negatives for hypoglycemia include no dizziness or headaches. Pertinent negatives for diabetes include no blurred vision, no chest pain, no fatigue, no polydipsia, no polyphagia and no polyuria. There are no hypoglycemic complications. Risk factors for coronary artery disease include diabetes mellitus, dyslipidemia, hypertension, obesity, sedentary lifestyle and post-menopausal. She has not had a previous visit with a dietitian. She participates in exercise three times a week. Her breakfast blood glucose is taken between 8-9 am. Her breakfast blood glucose range is generally 110-130 mg/dl. Eye exam is current.  Hypertension This is a chronic problem. The current episode started more than 1 year ago. The problem has been gradually improving since onset. The problem is controlled. Pertinent negatives include no blurred vision, chest pain, headaches, palpitations or shortness of breath. The  current treatment provides moderate improvement. Compliance problems include exercise.      Past Medical History:  Diagnosis Date  . COPD (chronic obstructive pulmonary disease) ([place])   . Diabetes ([place])   . High cholesterol   . Hypertension   . Pneumonia 09/10/2017     Family History  Problem Relation Age of Onset  . Heart murmur Mother   . Heart attack Father   . Diabetes Father   . Diabetes Brother      Current Outpatient Medications:  .  Accu-Chek FastClix Lancets MISC, Inject 1 each as directed daily. Use as directed to check blood sugars daily dx: e11.22, Disp: 50 each, Rfl: 11 .  aspirin 81 MG tablet, Take 81 mg by mouth daily., Disp: , Rfl:  .  Biotin 10000 MCG TBDP, Take by mouth., Disp: , Rfl:  .  bisoprolol (ZEBETA) 5 MG tablet, TAKE 1/2 TABLET(2.5 MG) BY MOUTH DAILY, Disp: 45 tablet, Rfl: 3 .  Budeson-Glycopyrrol-Formoterol (BREZTRI AEROSPHERE) 160-9-4.8 MCG/ACT AERO, Inhale 2 puffs into the lungs 2 (two) times daily., Disp: 10.7 g, Rfl: 6 .  CALCIUM-MAGNESIUM-ZINC PO, Take 1 capsule by mouth daily., Disp: , Rfl:  .  Cholecalciferol (VITAMIN D3) 2000 UNITS TABS, Take 2,000 Units by mouth daily., Disp: , Rfl:  .  famotidine (PEPCID) 20 MG tablet, One after supper, Disp: 90 tablet, Rfl: 3 .  gabapentin (NEURONTIN) 300 MG capsule, TAKE 1 CAPSULE(300 MG) BY MOUTH FOUR TIMES DAILY, Disp: 120 capsule, Rfl: 5 .  glucose blood (ACCU-CHEK GUIDE) test strip, 1 each by Other route daily. Use as instructed to check blood sugars 1 time per day dx: e11.22, Disp: 50 each, Rfl: 11 .  pantoprazole (PROTONIX) 40 MG tablet, TAKE 1 TABLET BY MOUTH 30 MINS BEFORE FIRST MEAL OF THE DAY, Disp: 90 tablet, Rfl: 2 .  rosuvastatin (CRESTOR) 20 MG tablet, TAKE 1 TABLET(20 MG) BY MOUTH AT BEDTIME, Disp: 90 tablet, Rfl: 1 .  Semaglutide, 1 MG/DOSE, (OZEMPIC, 1 MG/DOSE,) 4 MG/3ML SOPN, Inject 1 mg into the skin once a week., Disp: 3 mL, Rfl: 3 .  albuterol (VENTOLIN HFA) 108 (90 Base) MCG/ACT inhaler,  Inhale 2 puffs into the lungs every 4 (four) hours as needed for wheezing or shortness of breath., Disp: 18 g, Rfl: 12 .  chlorpheniramine (CHLOR-TRIMETON) 4 MG tablet, Take 4 mg by mouth as needed for allergies., Disp: , Rfl:  .  montelukast (SINGULAIR) 10 MG tablet, One tab po qd, Disp: 90 tablet, Rfl: 2 .  oxyCODONE (OXY IR/ROXICODONE) 5 MG immediate release tablet, Take 1 tablet by mouth as needed., Disp: , Rfl:    Allergies  Allergen Reactions  . Other Itching    HAIR DYE     Review of Systems  Constitutional: Negative.  Negative for fatigue.  Eyes: Negative for blurred vision.  Respiratory: Negative.  Negative for shortness of breath.   Cardiovascular: Negative.  Negative for chest pain and palpitations.  Gastrointestinal: Negative.   Endocrine: Negative for polydipsia, polyphagia and polyuria.  Neurological: Negative for dizziness and headaches.  Psychiatric/Behavioral: Negative.   All other systems reviewed and are negative.    Today's Vitals   04/04/21 1445  BP: 116/68  Pulse: 76  Temp: 98.1 F (36.7 C)  TempSrc: Oral  Weight: 193 lb 3.2 oz (87.6 kg)  Height: 5' 5" (1.651 m)  PainSc: 0-No pain   Body mass index is 32.15 kg/m.  Wt Readings from Last 3 Encounters:  04/18/21 197 lb 3.2 oz (89.4 kg)  04/12/21 195 lb 6.4 oz (88.6 kg)  04/04/21 193 lb 3.2 oz (87.6 kg)   Objective:  Physical Exam Vitals and nursing note reviewed.  Constitutional:      Appearance: Normal appearance.  HENT:     Head: Normocephalic and atraumatic.     Nose:     Comments: Masked     Mouth/Throat:     Comments: Masked  Cardiovascular:     Rate and Rhythm: Normal rate and regular rhythm.     Heart sounds: Normal heart sounds.  Pulmonary:     Effort: Pulmonary effort is normal.     Breath sounds: Wheezing present.     Comments: Few, scattered wheezes b/l Musculoskeletal:     Cervical back: Normal range of motion.  Skin:    General: Skin is warm.  Neurological:     General:  No focal deficit present.     Mental Status: She is alert.  Psychiatric:        Mood and Affect: Mood normal.        Behavior: Behavior normal.         Assessment And Plan:     1. Uncontrolled type 2 diabetes mellitus with hyperglycemia (HCC) Comments: Chronic, I will check labs as listed below. Encouraged to aim for at least 150 minutes of exercise per week. - BMP8+EGFR - Hemoglobin A1c  2. Essential hypertension Comments: Chronic, well controlled. She is encouraged to folow a low sodium diet. I will check renal function today.   3. Chronic obstructive pulmonary disease, unspecified COPD type ([place]) Comments: Chronic, encouraged to comply with inhaler use. Also encouraged to avoid triggers - also has a chronic cough.  4. Class 1 obesity due to excess calories with serious comorbidity and body mass index (BMI) of 32.0 to 32.9 in adult Comments: She is encouraged to strive for BMI less than 30 to decrease cardiac risk. Advised to aim for at least 150 minutes of exercise per week.  Patient was given opportunity to ask questions. Patient verbalized understanding of the plan and was able to repeat key elements of the plan. All questions were answered to their satisfaction.   I, [name], MD, have reviewed all documentation for this visit. The documentation on 04/22/21 for the exam, diagnosis, procedures, and orders are all accurate and complete.   IF YOU HAVE BEEN REFERRED TO A SPECIALIST, IT MAY TAKE 1-2 WEEKS TO SCHEDULE/PROCESS THE REFERRAL. IF YOU HAVE NOT HEARD FROM US/SPECIALIST IN TWO WEEKS, PLEASE GIVE [place] A CALL AT [phone].   THE PATIENT IS ENCOURAGED TO PRACTICE SOCIAL DISTANCING DUE TO THE COVID-19 PANDEMIC.

## 2021-04-05 LAB — BMP8+EGFR
BUN/Creatinine Ratio: 6 — ABNORMAL LOW (ref 12–28)
BUN: 5 mg/dL — ABNORMAL LOW (ref 8–27)
CO2: 26 mmol/L (ref 20–29)
Calcium: 10.1 mg/dL (ref 8.7–10.3)
Chloride: 101 mmol/L (ref 96–106)
Creatinine, Ser: 0.86 mg/dL (ref 0.57–1.00)
Glucose: 74 mg/dL (ref 65–99)
Potassium: 5.4 mmol/L — ABNORMAL HIGH (ref 3.5–5.2)
Sodium: 143 mmol/L (ref 134–144)
eGFR: 71 mL/min/{1.73_m2} (ref >59)

## 2021-04-05 LAB — HEMOGLOBIN A1C
Est. average glucose Bld gHb Est-mCnc: 134 mg/dL
Hgb A1c MFr Bld: 6.3 % — ABNORMAL HIGH (ref 4.8–5.6)

## 2021-04-09 ENCOUNTER — Encounter: Payer: Self-pay | Admitting: Internal Medicine

## 2021-04-11 ENCOUNTER — Telehealth: Payer: Self-pay

## 2021-04-11 NOTE — Chronic Care Management (AMB) (Signed)
Chronic Care Management Pharmacy Assistant   Name: CARTER KAMAN  MRN: [id number] DOB: 05/01/1946   Reason for Encounter: Hypertension Adherence Call    Recent office visits:  04/04/21-[name], Bailey Mech, MD ([place]).  Recent consult visits:  None  Hospital visits:  None in previous 6 months   Medication Changes Indicated: 04/04/21-Montelukast Sodium 10 MG (1 tablet daily).  Medications: Outpatient Encounter Medications as of 04/11/2021  Medication Sig  . Accu-Chek FastClix Lancets MISC Inject 1 each as directed daily. Use as directed to check blood sugars daily dx: e11.22  . albuterol (VENTOLIN HFA) 108 (90 Base) MCG/ACT inhaler Inhale 2 puffs into the lungs every 4 (four) hours as needed for wheezing or shortness of breath.  Marland Kitchen aspirin 81 MG tablet Take 81 mg by mouth daily.  . Biotin 10000 MCG TBDP Take by mouth.  . bisoprolol (ZEBETA) 5 MG tablet TAKE 1/2 TABLET(2.5 MG) BY MOUTH DAILY  . Budeson-Glycopyrrol-Formoterol (BREZTRI AEROSPHERE) 160-9-4.8 MCG/ACT AERO Inhale 2 puffs into the lungs 2 (two) times daily.  Marland Kitchen CALCIUM-MAGNESIUM-ZINC PO Take 1 capsule by mouth daily.  . Cholecalciferol (VITAMIN D3) 2000 UNITS TABS Take 2,000 Units by mouth daily.  . famotidine (PEPCID) 20 MG tablet One after supper  . gabapentin (NEURONTIN) 300 MG capsule TAKE 1 CAPSULE(300 MG) BY MOUTH FOUR TIMES DAILY  . glucose blood (ACCU-CHEK GUIDE) test strip 1 each by Other route daily. Use as instructed to check blood sugars 1 time per day dx: e11.22  . ipratropium-albuterol (DUONEB) 0.5-2.5 (3) MG/3ML SOLN TAKE 3 MILLILITERS BY NEBULIZATION EVERY 6 HOURS AS NEEDED  . lidocaine (LIDODERM) 5 % Place 3 patches onto the skin daily. Remove & Discard patch within 12 hours or as directed by MD  . Magnesium 250 MG TABS Take 1 tablet by mouth daily.  . montelukast (SINGULAIR) 10 MG tablet One tab po qd  . pantoprazole (PROTONIX) 40 MG tablet TAKE 1 TABLET BY MOUTH 30 MINS BEFORE FIRST MEAL OF THE DAY  .  rosuvastatin (CRESTOR) 20 MG tablet TAKE 1 TABLET(20 MG) BY MOUTH AT BEDTIME  . Semaglutide, 1 MG/DOSE, (OZEMPIC, 1 MG/DOSE,) 4 MG/3ML SOPN Inject 1 mg into the skin once a week.   No facility-administered encounter medications on file as of 04/11/2021.    Reviewed chart prior to disease state call. Spoke with patient regarding BP  Recent Office Vitals: BP Readings from Last 3 Encounters:  04/04/21 116/68  02/26/21 126/74  01/04/21 118/68   Pulse Readings from Last 3 Encounters:  04/04/21 76  02/26/21 82  01/04/21 66    Wt Readings from Last 3 Encounters:  04/04/21 193 lb 3.2 oz (87.6 kg)  02/26/21 191 lb 12.8 oz (87 kg)  01/04/21 190 lb 12.8 oz (86.5 kg)     Kidney Function Lab Results  Component Value Date/Time   CREATININE 0.86 04/04/2021 03:28 PM   CREATININE 0.83 01/04/2021 02:50 PM   GFRNONAA 70 01/04/2021 02:50 PM   GFRAA 81 01/04/2021 02:50 PM    BMP Latest Ref Rng & Units 04/04/2021 01/04/2021 09/06/2020  Glucose 65 - 99 mg/dL 74 96 72  BUN 8 - 27 mg/dL 5(L) 8 5(L)  Creatinine 0.57 - 1.00 mg/dL 0.86 0.83 0.76  BUN/Creat Ratio 12 - 28 6(L) 10(L) 7(L)  Sodium 134 - 144 mmol/L 143 142 141  Potassium 3.5 - 5.2 mmol/L 5.4(H) 5.1 4.4  Chloride 96 - 106 mmol/L 101 101 99  CO2 20 - 29 mmol/L 26 27 28   Calcium 8.7 -  10.3 mg/dL 10.1 9.7 9.6    . Current antihypertensive regimen:   Bisoprolol take a 1/2 tablet by mouth daily.   . How often are you checking your Blood Pressure? Patient reports she is not checking her blood pressure at home, feels as though her blood pressure is stable and denies any hypotension or hypertenison symptoms.   . Current home BP readings: None  . What recent interventions/DTPs have been made by any provider to improve Blood Pressure control since last CPP Visit: Patient reports taking medications as directed.  . Any recent hospitalizations or ED visits since last visit with CPP? No   . What diet changes have been made to improve Blood  Pressure Control?  o Patient reports she is eating more vegetables and cooking a balanced meal.  . What exercise is being done to improve your Blood Pressure Control?  o Patient reports she is walking daily Monday through Saturday for about 20 Minutes.  Adherence Review: Is the patient currently on ACE/ARB medication? No Does the patient have >5 day gap between last estimated fill dates? No   Note: Patient reports she did reach out to Dr. Baird [facility] office requesting a stress test for having chest pains. Patient informed she is due to see Dr. Laurance Flatten tomorrow 04/12/21 at 4:15 PM to discuss her concerns.   Patient aware she has a CCM Call follow up 05/08/21 at 1:30 PM with [name], CPP.   Star Rating Drugs: Rosuvastatin 20 MG: 90 DS, last filled on 01/21/21. Ozempic 1 MG: 30 DS, last filled on 01/04/21.  [name], CPP Notified.   Raynelle Highland, [place] Pharmacist Assistant [phone] CCM Total Time: 38 minutes

## 2021-04-12 ENCOUNTER — Other Ambulatory Visit: Payer: Self-pay

## 2021-04-12 ENCOUNTER — Encounter: Payer: Self-pay | Admitting: Nurse Practitioner

## 2021-04-12 ENCOUNTER — Ambulatory Visit (INDEPENDENT_AMBULATORY_CARE_PROVIDER_SITE_OTHER): Payer: Medicare Other | Admitting: Nurse Practitioner

## 2021-04-12 VITALS — BP 122/68 | HR 68 | Temp 98.3°F | Ht 65.0 in | Wt 195.4 lb

## 2021-04-12 DIAGNOSIS — J441 Chronic obstructive pulmonary disease with (acute) exacerbation: Secondary | ICD-10-CM

## 2021-04-12 DIAGNOSIS — Z8249 Family history of ischemic heart disease and other diseases of the circulatory system: Secondary | ICD-10-CM

## 2021-04-12 MED ORDER — IPRATROPIUM-ALBUTEROL 0.5-2.5 (3) MG/3ML IN SOLN: 3.0000 mL | Freq: Four times a day (QID) | RESPIRATORY_TRACT | 2 refills | Status: DC | PRN

## 2021-04-12 NOTE — Progress Notes (Signed)
I,Yamilka [employer] as a Education administrator for [employer], FNP.,have documented all relevant documentation on the behalf of Minette Brine, FNP,as directed by  Minette Brine, FNP while in the presence of Minette Brine, [place]. This visit occurred during the SARS-CoV-2 public health emergency.  Safety protocols were in place, including screening questions prior to the visit, additional usage of staff PPE, and extensive cleaning of exam room while observing appropriate contact time as indicated for disinfecting solutions.  Subjective:     Patient ID: [name] , female    DOB: 1946/02/22 , 75 y.o.   MRN: [id number]   Chief Complaint  Patient presents with  . referral    Patient stated she was having a discuss with a group of women regarding heart attacks and how the symptoms for women are different then men. So she felt she needs to have a stress test done due to having a family history of heart disease. She stated her mother has a heart murmur and her father died of a massive heart attack.     HPI  Patient stated she was having a discuss with a group of women regarding heart attacks and how the symptoms for women are different then men. So she felt she needs to have a stress test done due to having a family history of heart disease. She stated her mother has a heart murmur and her father died of a massive heart attack.  She has a brother that is deceased had 2 strokes.  She also reports having cramps in her stomach and back area.   She has been having more shortness of breath longer than usual for the last 3 weeks.    Wt Readings from Last 3 Encounters: 04/12/21 : 195 lb 6.4 oz (88.6 kg) 04/04/21 : 193 lb 3.2 oz (87.6 kg) 02/26/21 : 191 lb 12.8 oz (87 kg)    Past Medical History:  Diagnosis Date  . COPD (chronic obstructive pulmonary disease) ([place])   . Diabetes ([place])   . High cholesterol   . Hypertension   . Pneumonia 09/10/2017     Family History  Problem Relation Age of Onset   . Heart murmur Mother   . Heart attack Father   . Diabetes Father   . Diabetes Brother      Current Outpatient Medications:  .  Accu-Chek FastClix Lancets MISC, Inject 1 each as directed daily. Use as directed to check blood sugars daily dx: e11.22, Disp: 50 each, Rfl: 11 .  albuterol (VENTOLIN HFA) 108 (90 Base) MCG/ACT inhaler, Inhale 2 puffs into the lungs every 4 (four) hours as needed for wheezing or shortness of breath., Disp: 18 g, Rfl: 12 .  aspirin 81 MG tablet, Take 81 mg by mouth daily., Disp: , Rfl:  .  Biotin 10000 MCG TBDP, Take by mouth., Disp: , Rfl:  .  bisoprolol (ZEBETA) 5 MG tablet, TAKE 1/2 TABLET(2.5 MG) BY MOUTH DAILY, Disp: 45 tablet, Rfl: 3 .  Budeson-Glycopyrrol-Formoterol (BREZTRI AEROSPHERE) 160-9-4.8 MCG/ACT AERO, Inhale 2 puffs into the lungs 2 (two) times daily., Disp: 10.7 g, Rfl: 6 .  CALCIUM-MAGNESIUM-ZINC PO, Take 1 capsule by mouth daily., Disp: , Rfl:  .  Cholecalciferol (VITAMIN D3) 2000 UNITS TABS, Take 2,000 Units by mouth daily., Disp: , Rfl:  .  famotidine (PEPCID) 20 MG tablet, One after supper, Disp: 90 tablet, Rfl: 3 .  gabapentin (NEURONTIN) 300 MG capsule, TAKE 1 CAPSULE(300 MG) BY MOUTH FOUR TIMES DAILY, Disp: 120 capsule, Rfl: 5 .  glucose blood (ACCU-CHEK GUIDE) test strip, 1 each by Other route daily. Use as instructed to check blood sugars 1 time per day dx: e11.22, Disp: 50 each, Rfl: 11 .  lidocaine (LIDODERM) 5 %, Place 3 patches onto the skin daily. Remove & Discard patch within 12 hours or as directed by MD, Disp: 90 patch, Rfl: 1 .  Magnesium 250 MG TABS, Take 1 tablet by mouth daily., Disp: , Rfl:  .  montelukast (SINGULAIR) 10 MG tablet, One tab po qd, Disp: 90 tablet, Rfl: 2 .  pantoprazole (PROTONIX) 40 MG tablet, TAKE 1 TABLET BY MOUTH 30 MINS BEFORE FIRST MEAL OF THE DAY, Disp: 90 tablet, Rfl: 2 .  rosuvastatin (CRESTOR) 20 MG tablet, TAKE 1 TABLET(20 MG) BY MOUTH AT BEDTIME, Disp: 90 tablet, Rfl: 1 .  Semaglutide, 1 MG/DOSE,  (OZEMPIC, 1 MG/DOSE,) 4 MG/3ML SOPN, Inject 1 mg into the skin once a week., Disp: 3 mL, Rfl: 3 .  ipratropium-albuterol (DUONEB) 0.5-2.5 (3) MG/3ML SOLN, Take 3 mLs by nebulization every 6 (six) hours as needed., Disp: 180 mL, Rfl: 2   Allergies  Allergen Reactions  . Other Itching    HAIR DYE     Review of Systems  Constitutional: Negative.   Respiratory: Negative.   Cardiovascular: Negative.  Negative for chest pain, palpitations and leg swelling.  Musculoskeletal:       Upper back pain at times  Skin: Negative.   Neurological: Negative.  Negative for dizziness and headaches.  Psychiatric/Behavioral: Negative.      Today's Vitals   04/12/21 1625  BP: 122/68  Pulse: 68  Temp: 98.3 F (36.8 C)  TempSrc: Oral  Weight: 195 lb 6.4 oz (88.6 kg)  Height: 5\' 5"  (1.651 m)  PainSc: 0-No pain   Body mass index is 32.52 kg/m.   Objective:  Physical Exam Vitals reviewed.  Constitutional:      General: She is not in acute distress.    Appearance: Normal appearance.  Cardiovascular:     Rate and Rhythm: Normal rate and regular rhythm.     Pulses: Normal pulses.     Heart sounds: Normal heart sounds. No murmur heard.   Pulmonary:     Effort: Pulmonary effort is normal. No respiratory distress.     Breath sounds: Normal breath sounds. No wheezing.  Neurological:     General: No focal deficit present.     Mental Status: She is alert and oriented to person, place, and time.     Cranial Nerves: No cranial nerve deficit.  Psychiatric:        Mood and Affect: Mood normal.        Behavior: Behavior normal.        Thought Content: Thought content normal.        Judgment: Judgment normal.         Assessment And Plan:     1. COPD exacerbation ([place])  She is to use her albuterol nebulizer 2 times a day through the weekend if she is not better she is to call to the office for steroids.   2. Family history of heart attack  Due to her family history and her concern with the  symptoms of her having upper back pain I will refer to Cardiology for further work up. Denies chest pain today. She is requesting a stress test - Ambulatory referral to Cardiology     Patient was given opportunity to ask questions. Patient verbalized understanding of the plan and was able to  repeat key elements of the plan. All questions were answered to their satisfaction.  Minette Brine, FNP   I, Minette Brine, FNP, have reviewed all documentation for this visit. The documentation on 04/16/21 for the exam, diagnosis, procedures, and orders are all accurate and complete.   IF YOU HAVE BEEN REFERRED TO A SPECIALIST, IT MAY TAKE 1-2 WEEKS TO SCHEDULE/PROCESS THE REFERRAL. IF YOU HAVE NOT HEARD FROM US/SPECIALIST IN TWO WEEKS, PLEASE GIVE [place] A CALL AT [phone].   THE PATIENT IS ENCOURAGED TO PRACTICE SOCIAL DISTANCING DUE TO THE COVID-19 PANDEMIC.

## 2021-04-16 ENCOUNTER — Encounter: Payer: Self-pay | Admitting: Nurse Practitioner

## 2021-04-18 ENCOUNTER — Ambulatory Visit (INDEPENDENT_AMBULATORY_CARE_PROVIDER_SITE_OTHER): Payer: Medicare Other | Admitting: Cardiovascular Disease

## 2021-04-18 ENCOUNTER — Other Ambulatory Visit: Payer: Self-pay

## 2021-04-18 ENCOUNTER — Encounter: Payer: Self-pay | Admitting: Cardiovascular Disease

## 2021-04-18 DIAGNOSIS — R0789 Other chest pain: Secondary | ICD-10-CM | POA: Insufficient documentation

## 2021-04-18 DIAGNOSIS — R072 Precordial pain: Secondary | ICD-10-CM | POA: Diagnosis not present

## 2021-04-18 NOTE — Patient Instructions (Signed)
Medication Instructions:  Your physician recommends that you continue on your current medications as directed. Please refer to the Current Medication list given to you today.  *If you need a refill on your cardiac medications before your next appointment, please call your pharmacy*   Lab Work: none If you have labs (blood work) drawn today and your tests are completely normal, you will receive your results only by: Marland Kitchen MyChart Message (if you have MyChart) OR . A paper copy in the mail If you have any lab test that is abnormal or we need to change your treatment, we will call you to review the results.   Testing/Procedures: Your physician has requested that you have a lexiscan myoview. For further information please visit [email or web address]. Please follow instruction sheet, as given.     Follow-Up: At [facility], you and your health needs are our priority.  As part of our continuing mission to provide you with exceptional heart care, we have created designated Provider Care Teams.  These Care Teams include your primary Cardiologist (physician) and Advanced Practice Providers (APPs -  Physician Assistants and Nurse Practitioners) who all work together to provide you with the care you need, when you need it.  Your next appointment:   As needed  The format for your next appointment:   In Person  Provider:   You may see Dr.Nahser or one of the following Advanced Practice Providers on your designated Care Team:    Richardson Dopp, PA-C  [place], [place]

## 2021-04-18 NOTE — Progress Notes (Signed)
Cardiology Office Note:    Date:  04/18/2021   ID:  [name], DOB 1946/05/29, MRN [id number]  PCP:  [name], [place]  Cardiologist:  Seana Underwood  Advanced Practice Provider:  No care team member to display Electrophysiologist:  None     Referring MD: Minette Brine, FNP   Chief Complaint  Patient presents with  . Hypertension  . Obesity  . Hyperlipidemia     April 18, 2021   [name] is a 75 y.o. female with a hx of hypertension, hyperlipidemia, obesity, diabetes mellitus.  We are asked to see her today by Minette Brine, FNP for further assessment of her coronary artery risks. Wt is 197 lbs Her sister in law  was recently found to have CAD  .   She is worried about whether or not she has CAD She has a history of hypertension but that seems to be well controlled.  She has a history of hyperlipidemia and is on rosuvastatin.  Her last LDL is 88.  Her triglyceride level is 145.  She is a former smoker but quit many years ago.  She does have some COPD.  Exercises 20 to a exercise video ,  Does not get outside and walk  Has some DOE  She has some tightness in chest on occasion when she walks  Eased when she stops walking or uses the nebulizer  Uses the nebulizer as needed.   Still eats salty foods  Avoids fried foods     Past Medical History:  Diagnosis Date  . COPD (chronic obstructive pulmonary disease) ([place])   . Diabetes ([place])   . High cholesterol   . Hypertension   . Pneumonia 09/10/2017    Past Surgical History:  Procedure Laterality Date  . ABDOMINAL HYSTERECTOMY  1995  . carpel tunnel surgery  2006  . COLONOSCOPY  2014  . COLOSTOMY  2007  . colostomy let down  2008  . endoscopy  2014  . rotator cuff tear Left 04/14/2020    Current Medications: Current Meds  Medication Sig  . Accu-Chek FastClix Lancets MISC Inject 1 each as directed daily. Use as directed to check blood sugars daily dx: e11.22  .  albuterol (VENTOLIN HFA) 108 (90 Base) MCG/ACT inhaler Inhale 2 puffs into the lungs every 4 (four) hours as needed for wheezing or shortness of breath.  Marland Kitchen aspirin 81 MG tablet Take 81 mg by mouth daily.  . Biotin 10000 MCG TBDP Take by mouth.  . bisoprolol (ZEBETA) 5 MG tablet TAKE 1/2 TABLET(2.5 MG) BY MOUTH DAILY  . Budeson-Glycopyrrol-Formoterol (BREZTRI AEROSPHERE) 160-9-4.8 MCG/ACT AERO Inhale 2 puffs into the lungs 2 (two) times daily.  Marland Kitchen CALCIUM-MAGNESIUM-ZINC PO Take 1 capsule by mouth daily.  . chlorpheniramine (CHLOR-TRIMETON) 4 MG tablet Take 4 mg by mouth as needed for allergies.  . Cholecalciferol (VITAMIN D3) 2000 UNITS TABS Take 2,000 Units by mouth daily.  . famotidine (PEPCID) 20 MG tablet One after supper  . gabapentin (NEURONTIN) 300 MG capsule TAKE 1 CAPSULE(300 MG) BY MOUTH FOUR TIMES DAILY  . glucose blood (ACCU-CHEK GUIDE) test strip 1 each by Other route daily. Use as instructed to check blood sugars 1 time per day dx: e11.22  . montelukast (SINGULAIR) 10 MG tablet One tab po qd  . oxyCODONE (OXY IR/ROXICODONE) 5 MG immediate release tablet Take 1 tablet by mouth as needed.  . pantoprazole (PROTONIX) 40 MG tablet TAKE 1 TABLET BY MOUTH 30 MINS  BEFORE FIRST MEAL OF THE DAY  . rosuvastatin (CRESTOR) 20 MG tablet TAKE 1 TABLET(20 MG) BY MOUTH AT BEDTIME  . Semaglutide, 1 MG/DOSE, (OZEMPIC, 1 MG/DOSE,) 4 MG/3ML SOPN Inject 1 mg into the skin once a week.  . [DISCONTINUED] ipratropium-albuterol (DUONEB) 0.5-2.5 (3) MG/3ML SOLN Take 3 mLs by nebulization every 6 (six) hours as needed.  . [DISCONTINUED] lidocaine (LIDODERM) 5 % Place 3 patches onto the skin daily. Remove & Discard patch within 12 hours or as directed by MD  . [DISCONTINUED] Magnesium 250 MG TABS Take 1 tablet by mouth daily.     Allergies:   Other   Social History   Socioeconomic History  . Marital status: Married    Spouse name: Not on file  . Number of children: Not on file  . Years of education: Not  on file  . Highest education level: Not on file  Occupational History  . Occupation: Retired    Comment: Former Advertising copywriter to Scientist, physiological of education  Tobacco Use  . Smoking status: Former Smoker    Packs/day: 0.50    Years: 42.00    Pack years: 21.00    Types: Cigarettes    Start date: 01/30/1967    Quit date: 01/30/2006    Years since quitting: 15.2  . Smokeless tobacco: Never Used  . Tobacco comment: intermittent cycles b/w 1/4-1/2ppd  Vaping Use  . Vaping Use: Never used  Substance and Sexual Activity  . Alcohol use: No  . Drug use: No  . Sexual activity: Not Currently  Other Topics Concern  . Not on file  Social History Narrative  . Not on file   Social Determinants of Health   Financial Resource Strain: High Risk  . Difficulty of Paying Living Expenses: Hard  Food Insecurity: No Food Insecurity  . Worried About Charity fundraiser in the Last Year: Never true  . Ran Out of Food in the Last Year: Never true  Transportation Needs: No Transportation Needs  . Lack of Transportation (Medical): No  . Lack of Transportation (Non-Medical): No  Physical Activity: Insufficiently Active  . Days of Exercise per Week: 5 days  . Minutes of Exercise per Session: 10 min  Stress: No Stress Concern Present  . Feeling of Stress : Not at all  Social Connections: Socially Integrated  . Frequency of Communication with Friends and Family: More than three times a week  . Frequency of Social Gatherings with Friends and Family: More than three times a week  . Attends Religious Services: More than 4 times per year  . Active Member of Clubs or Organizations: Yes  . Attends Archivist Meetings: More than 4 times per year  . Marital Status: Married     Family History: The patient's family history includes Diabetes in her brother and father; Heart attack in her father; Heart murmur in her mother.  ROS:   Please see the history of present illness.     All other systems reviewed  and are negative.  EKGs/Labs/Other Studies Reviewed:    The following studies were reviewed today:   EKG:     April 18, 2021:   NSR .  No ST   Recent Labs: 10/16/2020: Hemoglobin 13.8; Platelets 399.0 01/04/2021: ALT 27 04/04/2021: BUN 5; Creatinine, Ser 0.86; Potassium 5.4; Sodium 143  Recent Lipid Panel    Component Value Date/Time   CHOL 175 01/04/2021 1450   TRIG 145 01/04/2021 1450   HDL 62 01/04/2021 1450  CHOLHDL 2.8 01/04/2021 1450   LDLCALC 88 01/04/2021 1450     Risk Assessment/Calculations:       Physical Exam:    VS:  BP 130/72   Pulse 72   Ht 5\' 5"  (1.651 m)   Wt 197 lb 3.2 oz (89.4 kg)   BMI 32.82 kg/m     Wt Readings from Last 3 Encounters:  04/18/21 197 lb 3.2 oz (89.4 kg)  04/12/21 195 lb 6.4 oz (88.6 kg)  04/04/21 193 lb 3.2 oz (87.6 kg)     GEN:   Elderly, moderately obese female,  NAD  HEENT: Normal NECK: No JVD; No carotid bruits LYMPHATICS: No lymphadenopathy CARDIAC: RRR, no murmurs, rubs, gallops RESPIRATORY: Slight wheezes bilaterally ABDOMEN: Soft, non-tender, non-distended MUSCULOSKELETAL:  No edema; No deformity  SKIN: Warm and dry NEUROLOGIC:  Alert and oriented x 3 PSYCHIATRIC:  Normal affect   ASSESSMENT:    1. Precordial pain    PLAN:    In order of problems listed above:  1. Chest tightness: [name] presents with some episodes of chest tightness.  These episodes might be due to her COPD but also could be due to coronary artery disease.  She has multiple risk factors for CAD including diabetes mellitus, hypertension, hyperlipidemia.  She has a family history of coronary artery disease.  I would like to schedule her for a Lexiscan Myoview study.   If the Myoview study is normal then we will see her back on an as-needed basis.  If the Myoview study shows some concern for coronary artery disease will bring her back and consider heart catheterization.  2.  Hyperlipidemia: Her LDL is 88.  I would suggest that she be started on  more aggressive therapy to try to achieve an LDL between 50 and 70.  I will defer to Dr. Baird Cancer for further management of her hyperlipidemia.  3.  Hypertension: Her blood pressure is currently well controlled.  She does not necessarily limit her salt but does not eat high salty foods including bacon, sausage, ham, bologna.  She has an occasional hot dog.  4.  Diabetes mellitus: Controlled by Dr. Baird Cancer.    Shared Decision Making/Informed Consent The risks [chest pain, shortness of breath, cardiac arrhythmias, dizziness, blood pressure fluctuations, myocardial infarction, stroke/transient ischemic attack, nausea, vomiting, allergic reaction, radiation exposure, metallic taste sensation and life-threatening complications (estimated to be 1 in 10,000)], benefits (risk stratification, diagnosing coronary artery disease, treatment guidance) and alternatives of a nuclear stress test were discussed in detail with [name] and she agrees to proceed.     Medication Adjustments/Labs and Tests Ordered: Current medicines are reviewed at length with the patient today.  Concerns regarding medicines are outlined above.  Orders Placed This Encounter  Procedures  . MYOCARDIAL PERFUSION IMAGING  . EKG 12-Lead   No orders of the defined types were placed in this encounter.   Patient Instructions  Medication Instructions:  Your physician recommends that you continue on your current medications as directed. Please refer to the Current Medication list given to you today.  *If you need a refill on your cardiac medications before your next appointment, please call your pharmacy*   Lab Work: none If you have labs (blood work) drawn today and your tests are completely normal, you will receive your results only by: Marland Kitchen MyChart Message (if you have MyChart) OR . A paper copy in the mail If you have any lab test that is abnormal or we need to change your treatment, we  will call you to review the  results.   Testing/Procedures: Your physician has requested that you have a lexiscan myoview. For further information please visit [email or web address]. Please follow instruction sheet, as given.     Follow-Up: At [facility], you and your health needs are our priority.  As part of our continuing mission to provide you with exceptional heart care, we have created designated Provider Care Teams.  These Care Teams include your primary Cardiologist (physician) and Advanced Practice Providers (APPs -  Physician Assistants and Nurse Practitioners) who all work together to provide you with the care you need, when you need it.  Your next appointment:   As needed  The format for your next appointment:   In Person  Provider:   You may see Dr.Yasiel Goyne or one of the following Advanced Practice Providers on your designated Care Team:    Richardson Dopp, PA-C  Robbie Lis, [place]        Signed, Mertie Moores, MD  04/18/2021 10:19 AM    [place]

## 2021-04-22 ENCOUNTER — Other Ambulatory Visit: Payer: Self-pay | Admitting: Internal Medicine

## 2021-04-25 ENCOUNTER — Telehealth (HOSPITAL_COMMUNITY): Payer: Self-pay

## 2021-04-25 NOTE — Telephone Encounter (Signed)
Patient called to verify time of stress test on Tuesday.  She will be here at 10:00 for a 10:15 appointment.

## 2021-04-25 NOTE — Telephone Encounter (Signed)
Spoke with the patient, detailed instructions given. She stated that she understood and would be here for her test. Asked to call back with any questions. S.Aldrin Engelhard EMTP 

## 2021-05-01 ENCOUNTER — Ambulatory Visit (HOSPITAL_COMMUNITY): Payer: Medicare Other | Attending: Internal Medicine

## 2021-05-01 ENCOUNTER — Other Ambulatory Visit: Payer: Self-pay

## 2021-05-01 DIAGNOSIS — R072 Precordial pain: Secondary | ICD-10-CM | POA: Insufficient documentation

## 2021-05-01 LAB — MYOCARDIAL PERFUSION IMAGING
LV dias vol: 65 mL (ref 46–106)
LV sys vol: 25 mL
Peak HR: 99 {beats}/min
Rest HR: 73 {beats}/min
SDS: 6
SRS: 0
SSS: 6
TID: 1.02

## 2021-05-01 MED ORDER — TECHNETIUM TC 99M TETROFOSMIN IV KIT
8.1000 | PACK | Freq: Once | INTRAVENOUS | Status: AC | PRN
Start: 2021-05-01 — End: 2021-05-01
  Administered 2021-05-01: 8.1 via INTRAVENOUS
  Filled 2021-05-01: qty 9

## 2021-05-01 MED ORDER — TECHNETIUM TC 99M TETROFOSMIN IV KIT
27.4000 | PACK | Freq: Once | INTRAVENOUS | Status: AC | PRN
Start: 2021-05-01 — End: 2021-05-01
  Administered 2021-05-01: 27.4 via INTRAVENOUS
  Filled 2021-05-01: qty 28

## 2021-05-01 MED ORDER — REGADENOSON 0.4 MG/5ML IV SOLN
0.4000 mg | Freq: Once | INTRAVENOUS | Status: AC
  Administered 2021-05-01: 0.4 mg via INTRAVENOUS

## 2021-05-03 ENCOUNTER — Telehealth: Payer: Self-pay

## 2021-05-03 NOTE — Telephone Encounter (Signed)
Patient reorder form faxed to [facility] for Ozempic 1 mg, patients application completed and signed by Dr. Baird Cancer, faxed to [facility]

## 2021-05-07 ENCOUNTER — Telehealth: Payer: Self-pay

## 2021-05-07 NOTE — Chronic Care Management (AMB) (Signed)
Left patient an appointment reminder with [name] ,[place] on 05-08-2021 at 1:30. Instructed patient to have all meds/supplements, any logs available for review and if unable to keep appointment to call to reschedule.  [place]  [phone]

## 2021-05-08 ENCOUNTER — Ambulatory Visit (INDEPENDENT_AMBULATORY_CARE_PROVIDER_SITE_OTHER): Payer: Medicare Other

## 2021-05-08 DIAGNOSIS — E1165 Type 2 diabetes mellitus with hyperglycemia: Secondary | ICD-10-CM | POA: Diagnosis not present

## 2021-05-08 DIAGNOSIS — E7800 Pure hypercholesterolemia, unspecified: Secondary | ICD-10-CM

## 2021-05-08 NOTE — Progress Notes (Signed)
Chronic Care Management Pharmacy Note  05/09/2021 Name:  [name] MRN:  [id number] DOB:  1946/05/14  Subjective: [name] is an 75 y.o. year old female who is a primary patient of [name], MD.  The CCM team was consulted for assistance with disease management and care coordination needs.    Engaged with patient by telephone for follow up visit in response to provider referral for pharmacy case management and/or care coordination services.   Consent to Services:  The patient was given information about Chronic Care Management services, agreed to services, and gave verbal consent prior to initiation of services.  Please see initial visit note for detailed documentation.   Patient Care Team: [name], MD as PCP - General (Internal Medicine) Lynne Logan, RN as Case Manager Mayford Knife, [facility] (Pharmacist)  Recent office visits: 04/12/2021 - COPD Exacerbation  04/04/2021  - PCP OV  Recent consult visits: 04/18/2021 [facility] visits: None in previous 6 months  Objective:  Lab Results  Component Value Date   CREATININE 0.86 04/04/2021   BUN 5 (L) 04/04/2021   GFRNONAA 70 01/04/2021   GFRAA 81 01/04/2021   NA 143 04/04/2021   K 5.4 (H) 04/04/2021   CALCIUM 10.1 04/04/2021   CO2 26 04/04/2021   GLUCOSE 74 04/04/2021    Lab Results  Component Value Date/Time   HGBA1C 6.3 (H) 04/04/2021 03:28 PM   HGBA1C 6.3 (H) 01/04/2021 02:50 PM   MICROALBUR 10 09/06/2020 12:54 PM   MICROALBUR 10 09/01/2019 02:42 PM    Last diabetic Eye exam:  Lab Results  Component Value Date/Time   HMDIABEYEEXA No Retinopathy 12/04/2020 12:00 AM    Last diabetic Foot exam: No results found for: HMDIABFOOTEX   Lab Results  Component Value Date   CHOL 175 01/04/2021   HDL 62 01/04/2021   LDLCALC 88 01/04/2021   TRIG 145 01/04/2021   CHOLHDL 2.8 01/04/2021    Hepatic Function Latest Ref Rng & Units 01/04/2021 05/04/2020 09/01/2019  Total Protein 6.0  - 8.5 g/dL 7.0 7.1 7.7  Albumin 3.7 - 4.7 g/dL 4.2 4.6 4.7  AST 0 - 40 IU/L 33 34 22  ALT 0 - 32 IU/L '27 21 11  ' Alk Phosphatase 44 - 121 IU/L 70 74 70  Total Bilirubin 0.0 - 1.2 mg/dL 0.4 0.2 0.4    Lab Results  Component Value Date/Time   TSH 1.400 01/04/2020 10:57 AM   TSH 0.88 07/29/2018 12:00 AM    CBC Latest Ref Rng & Units 10/16/2020 09/06/2020 09/01/2019  WBC 4.0 - 10.5 K/uL 6.0 6.2 7.0  Hemoglobin 12.0 - 15.0 g/dL 13.8 14.2 13.5  Hematocrit 36.0 - 46.0 % 41.2 45.0 41.8  Platelets 150.0 - 400.0 K/uL 399.0 373 307    Lab Results  Component Value Date/Time   VD25OH 33.1 09/06/2020 02:25 PM   VD25OH 39.9 07/29/2018 12:00 AM    Clinical ASCVD: Yes  The ASCVD Risk score ([place]., et al., 2013) failed to calculate for the following reasons:   Unable to determine if patient is Non-Hispanic African American    Depression screen [facility] 2/9 09/06/2020 09/01/2019 07/01/2019  Decreased Interest 0 0 0  Down, Depressed, Hopeless 0 3 0  PHQ - 2 Score 0 3 0  Altered sleeping - 0 -  Tired, decreased energy - 0 -  Change in appetite - 0 -  Feeling bad or failure about yourself  - 0 -  Trouble concentrating -  0 -  Moving slowly or fidgety/restless - 0 -  Suicidal thoughts - 0 -  PHQ-9 Score - 3 -  Difficult doing work/chores - Not difficult at all -  Some recent data might be hidden    Social History   Tobacco Use  Smoking Status Former Smoker  . Packs/day: 0.50  . Years: 42.00  . Pack years: 21.00  . Types: Cigarettes  . Start date: 01/30/1967  . Quit date: 01/30/2006  . Years since quitting: 15.2  Smokeless Tobacco Never Used  Tobacco Comment   intermittent cycles b/w 1/4-1/2ppd   BP Readings from Last 3 Encounters:  04/18/21 130/72  04/12/21 122/68  04/04/21 116/68   Pulse Readings from Last 3 Encounters:  04/18/21 72  04/12/21 68  04/04/21 76   Wt Readings from Last 3 Encounters:  05/01/21 197 lb (89.4 kg)  04/18/21 197 lb 3.2 oz (89.4 kg)  04/12/21 195 lb 6.4 oz  (88.6 kg)   BMI Readings from Last 3 Encounters:  05/01/21 32.78 kg/m  04/18/21 32.82 kg/m  04/12/21 32.52 kg/m    Assessment/Interventions: Review of patient past medical history, allergies, medications, health status, including review of consultants reports, laboratory and other test data, was performed as part of comprehensive evaluation and provision of chronic care management services.   SDOH:  (Social Determinants of Health) assessments and interventions performed: No  SDOH Screenings   Alcohol Screen: Not on file  Depression (PHQ2-9): Low Risk   . PHQ-2 Score: 0  Financial Resource Strain: High Risk  . Difficulty of Paying Living Expenses: Hard  Food Insecurity: No Food Insecurity  . Worried About Charity fundraiser in the Last Year: Never true  . Ran Out of Food in the Last Year: Never true  Housing: Not on file  Physical Activity: Insufficiently Active  . Days of Exercise per Week: 5 days  . Minutes of Exercise per Session: 10 min  Social Connections: Socially Integrated  . Frequency of Communication with Friends and Family: More than three times a week  . Frequency of Social Gatherings with Friends and Family: More than three times a week  . Attends Religious Services: More than 4 times per year  . Active Member of Clubs or Organizations: Yes  . Attends Archivist Meetings: More than 4 times per year  . Marital Status: Married  Stress: No Stress Concern Present  . Feeling of Stress : Not at all  Tobacco Use: Medium Risk  . Smoking Tobacco Use: Former Smoker  . Smokeless Tobacco Use: Never Used  Transportation Needs: No Transportation Needs  . Lack of Transportation (Medical): No  . Lack of Transportation (Non-Medical): No    CCM Care Plan  Allergies  Allergen Reactions  . Other Itching    HAIR DYE    Medications Reviewed Today    Reviewed by Mayford Knife, RPH (Pharmacist) on 05/08/21 at 1336  Med List Status: <None>  Medication Order  Taking? Sig Documenting Provider Last Dose Status Informant  Accu-Chek FastClix Lancets MISC [id number] Yes Inject 1 each as directed daily. Use as directed to check blood sugars daily dx: e11.22 [name], MD Taking Active   albuterol (VENTOLIN HFA) 108 (90 Base) MCG/ACT inhaler [id number] Yes Inhale 2 puffs into the lungs every 4 (four) hours as needed for wheezing or shortness of breath. [name], MD Taking Active   aspirin 81 MG tablet [id number] Yes Take 81 mg by mouth daily. [provider] Taking Active Self  Biotin 10000 MCG TBDP [id number] Yes Take by mouth. [provider] Taking Active   bisoprolol (ZEBETA) 5 MG tablet [id number] Yes TAKE 1/2 TABLET(2.5 MG) BY MOUTH DAILY [name], MD Taking Active   Budeson-Glycopyrrol-Formoterol (BREZTRI AEROSPHERE) 160-9-4.8 MCG/ACT AERO [id number] Yes Inhale 2 puffs into the lungs 2 (two) times daily. Tanda Rockers, MD Taking Active   CALCIUM-MAGNESIUM-ZINC PO [id number] Yes Take 1 capsule by mouth daily. [provider] Taking Active   chlorpheniramine (CHLOR-TRIMETON) 4 MG tablet [id number]  Take 4 mg by mouth as needed for allergies. [provider]  Active   Cholecalciferol (VITAMIN D3) 2000 UNITS TABS [id number] Yes Take 2,000 Units by mouth daily. [provider] Taking Active Self  famotidine (PEPCID) 20 MG tablet [id number] Yes One after supper Tanda Rockers, MD Taking Active   gabapentin (NEURONTIN) 300 MG capsule [id number] Yes TAKE 1 CAPSULE(300 MG) BY MOUTH FOUR TIMES DAILY Tanda Rockers, MD Taking Active            Med Note Mayford Knife   Tue May 08, 2021  1:33 PM) Taking 1 capsule three times per day.   glucose blood (ACCU-CHEK GUIDE) test strip [id number] Yes 1 each by Other route daily. Use as instructed to check blood sugars 1 time per day dx: e11.22 [name], MD Taking Active   montelukast (SINGULAIR) 10 MG tablet [id number] Yes One tab po qd [name], MD Taking  Active   oxyCODONE (OXY IR/ROXICODONE) 5 MG immediate release tablet [id number]  Take 1 tablet by mouth as needed. [provider]  Active   pantoprazole (PROTONIX) 40 MG tablet [id number] Yes TAKE 1 TABLET BY MOUTH 30 MINS BEFORE FIRST MEAL OF THE DAY [name], MD Taking Active   rosuvastatin (CRESTOR) 20 MG tablet [id number] Yes TAKE 1 TABLET(20 MG) BY MOUTH AT BEDTIME [name], MD Taking Active   Semaglutide, 1 MG/DOSE, (OZEMPIC, 1 MG/DOSE,) 4 MG/3ML SOPN [id number] Yes Inject 1 mg into the skin once a week. [name], MD Taking Active           Patient Active Problem List   Diagnosis Date Noted  . Chest tightness 04/18/2021  . Former smoker 08/21/2020  . High cholesterol   . Type II diabetes mellitus, uncontrolled ([place]) 11/09/2018  . Diarrhea   . Community acquired pneumonia of left lower lobe of lung   . Rhinovirus infection 10/02/2016  . Acute asthma exacerbation 10/02/2016  . COPD with acute exacerbation ([place]) 09/28/2016  . Hypoxia 09/28/2016  . SOB (shortness of breath) 09/28/2016  . COPD exacerbation ([place]) 09/28/2016  . Essential hypertension 11/03/2014  . Cough 10/19/2014  . COPD GOLD II  03/21/2014  . Rhinitis, nonallergic 03/21/2014    Immunization History  Administered Date(s) Administered  . Fluad Quad(high Dose 65+) 09/06/2020  . Influenza Split 09/29/2013, 09/30/2015  . Influenza Whole 08/30/2016  . Influenza, High Dose Seasonal PF 09/29/2017, 09/14/2018, 09/01/2019  . Influenza,inj,Quad PF,6+ Mos 09/12/2014  . Influenza-Unspecified 09/14/2018  . PFIZER(Purple Top)SARS-COV-2 Vaccination 01/19/2020, 02/09/2020, 12/21/2020  . Pneumococcal Conjugate-13 01/06/2020  . Pneumococcal Polysaccharide-23 10/07/2013  . Pneumococcal-Unspecified 10/05/2013  . Tdap 06/07/2019, 06/09/2019  . Zoster Recombinat (Shingrix) 07/26/2020, 10/09/2020    Conditions to be addressed/monitored:  Hyperlipidemia and Diabetes  Care Plan : [place]   Updates made by Mayford Knife, RPH since 05/09/2021 12:00 AM    Problem: HTN, HLD, DM II, COPD   Priority: High    Long-Range Goal: Disease Management  Start Date: 02/06/2021  Recent Progress: On track  Priority: High  Note:    Current Barriers:  . Unable to achieve control of cholesterol   . Unable to independently afford treatment regimen  Pharmacist Clinical Goal(s):  Marland Kitchen Patient will verbalize ability to afford treatment regimen . achieve adherence to monitoring guidelines and medication adherence to achieve therapeutic efficacy through collaboration with PharmD and provider.    Interventions: . 1:1 collaboration with [name], MD regarding development and update of comprehensive plan of care as evidenced by provider attestation and co-signature . Inter-disciplinary care team collaboration (see longitudinal plan of care) . Comprehensive medication review performed; medication list updated in electronic medical record  Hypertension (BP goal <130/80) -Controlled -Current treatment: . Bisoprolol 5 mg - taking 1/2 tablet by mouth daily -Current home readings: patient is not currently checking her BP at home -Current dietary habits:will discuss further during next office visit -Current exercise habits: she is exercising and walking with an APP on the internet  -Denies hypotensive/hypertensive symptoms -Educated on BP goals and benefits of medications for prevention of heart attack, stroke and kidney damage; Daily salt intake goal < 2300 mg; Symptoms of hypotension and importance of maintaining adequate hydration; -Recommended to continue current medication  Hyperlipidemia: (LDL goal < 70) -uncontrolled -Current treatment: . Crestor 20 mg tablet daily -Current dietary patterns: patient reports that her daughter and husband who are overweight so she is trying to work on eating more healthy  -Educated on Cholesterol goals;  Benefits of statin for ASCVD risk  reduction; Importance of limiting foods high in cholesterol; Exercise goal of 150 minutes per week; -Counseled on diet and exercise extensively Recommended to continue current medication  Diabetes (A1c goal <7%) -Controlled -Current medications: . Ozempic 1 mg - inject 1 mg into the skin once a week.  -Current home glucose readings . fasting glucose: patient has not been checking her blood sugar frequently between 100-90's  -Denies hypoglycemic/hyperglycemic symptoms -Current meal patterns: will discuss during next office visit . drinks: drinking plenty of water -Current exercise: using the app on youtube  -Educated on A1c and blood sugar goals; Prevention and management of hypoglycemic episodes; Benefits of routine self-monitoring of blood sugar; Carbohydrate counting and/or plate method -Counseled to check feet daily and get yearly eye exams -Recommended to continue current medication  -Collaborated with patient assistance program and confirmed patients is eligible refills of Ozempic.  -Contacted patient and informed her that her Ozempic would be coming in within the next two weeks.    COPD (Goal: control symptoms and prevent exacerbations) -controlled -Current treatment  . Breztri 160-9-4.8 MCG - inhale 2 puffs into the lungs two times daily.  -Gold Grade: Gold 2 (FEV1 50-79%) -Co-managed by Dr. Baird Cancer and Dr. [name] -Patient denies consistent use of maintenance inhaler -Frequency of rescue inhaler use: not using often  -Counseled on Proper inhaler technique; Benefits of consistent maintenance inhaler use Collaborated with PCP to have patient assistance paperwork MD portion to be completed by Dr. Baird Cancer to expedite the process. Patient agreed to fill one month supply of Breztri at the pharmacy and while pending application approval.  -Recommended that patient use a Spacer, explained to patient what a spacer was and the benefits.  -Patient reports that she wants to confirm  that a spacer is apart of her insurance benefits.    Patient Goals/Self-Care Activities . Over the next 60 days, patient will:  - take medications as prescribed collaborate with provider on medication access solutions  Follow  Up Plan: Telephone follow up appointment with care management team member scheduled for: 09/18/2021 The patient has been provided with contact information for the care management team and has been advised to call with any health related questions or concerns.          Medication Assistance: Ozempic obtained through [employer] medication assistance program.  Enrollment ends 11/2021.  Patient's preferred pharmacy is:  [id number] [address], [place] [address] [place] [address] Phone: [phone] Fax: [phone]  Uses pill box? Yes Pt endorses 99% compliance  We discussed: Benefits of medication synchronization, packaging and delivery as well as enhanced pharmacist oversight with [employer]. Patient decided to: Continue current medication management strategy  Care Plan and Follow Up Patient Decision:  Patient agrees to Care Plan and Follow-up.  Plan: The patient has been provided with contact information for the care management team and has been advised to call with any health related questions or concerns.    [name], PharmD Clinical Pharmacist [facility] [phone]

## 2021-05-09 NOTE — Patient Instructions (Signed)
Visit Information It was great speaking with you today!  Please let me know if you have any questions about our visit.  Goals Addressed            This Visit's Progress   . Manage My Medicine       Timeframe:  Long-Range Goal Priority:  High Start Date:    02/06/2021                          Expected End Date:  Next Follow Up Appointment: 09/18/2021               - call for medicine refill 2 or 3 days before it runs out - call if I am sick and can't take my medicine - keep a list of all the medicines I take; vitamins and herbals too - use a pillbox to sort medicine    Why is this important?   . These steps will help you keep on track with your medicines.           Patient Care Plan: CCM Pharmacy Care Plan    Problem Identified: HTN, HLD, DM II, COPD   Priority: High    Long-Range Goal: Disease Management   Start Date: 02/06/2021  Recent Progress: On track  Priority: High  Note:    Current Barriers:  . Unable to achieve control of cholesterol    Pharmacist Clinical Goal(s):  Marland Kitchen Over the next 90 days, patient will achieve adherence to monitoring guidelines and medication adherence to achieve therapeutic efficacy through collaboration with PharmD and provider.    Interventions: . 1:1 collaboration with [name], MD regarding development and update of comprehensive plan of care as evidenced by provider attestation and co-signature . Inter-disciplinary care team collaboration (see longitudinal plan of care) . Comprehensive medication review performed; medication list updated in electronic medical record  Hypertension (BP goal <130/80) -Controlled -Current treatment: . Bisoprolol 5 mg - taking 1/2 tablet by mouth daily -Current home readings: patient is not currently checking her BP at home -Current dietary habits:will discuss further during next office visit -Current exercise habits: she is exercising and walking with an APP on the internet  -Denies  hypotensive/hypertensive symptoms -Educated on BP goals and benefits of medications for prevention of heart attack, stroke and kidney damage; Daily salt intake goal < 2300 mg; Symptoms of hypotension and importance of maintaining adequate hydration; -Recommended to continue current medication  Hyperlipidemia: (LDL goal < 70) -uncontrolled -Current treatment: . Crestor 20 mg tablet daily -Current dietary patterns: patient reports that her daughter and husband who are overweight so she is trying to work on eating more healthy  -Educated on Cholesterol goals;  Benefits of statin for ASCVD risk reduction; Importance of limiting foods high in cholesterol; Exercise goal of 150 minutes per week; -Counseled on diet and exercise extensively Recommended to continue current medication  Diabetes (A1c goal <7%) -Controlled -Current medications: . Ozempic 1 mg - inject 1 mg into the skin once a week.  -Current home glucose readings . fasting glucose: patient has not been checking her blood sugar frequently between 100-90's  -Denies hypoglycemic/hyperglycemic symptoms -Current meal patterns: will discuss during next office visit . drinks: drinking plenty of water -Current exercise: using the app on youtube  -Educated on A1c and blood sugar goals; Prevention and management of hypoglycemic episodes; Benefits of routine self-monitoring of blood sugar; Carbohydrate counting and/or plate method -Counseled to check feet daily and get yearly  eye exams -Recommended to continue current medication  -Collaborated with patient assistance program and confirmed patients is eligible refills of Ozempic.  -Contacted patient and informed her that her Ozempic would be coming in within the next two weeks.    COPD (Goal: control symptoms and prevent exacerbations) -controlled -Current treatment  . Breztri 160-9-4.8 MCG - inhale 2 puffs into the lungs two times daily.  -Gold Grade: Gold 2 (FEV1  50-79%) -Co-managed by Dr. Baird Cancer and Dr. [name] -Patient denies consistent use of maintenance inhaler -Frequency of rescue inhaler use: not using often  -Counseled on Proper inhaler technique; Benefits of consistent maintenance inhaler use Collaborated with PCP to have patient assistance paperwork MD portion to be completed by Dr. Baird Cancer to expedite the process. Patient agreed to fill one month supply of Breztri at the pharmacy and while pending application approval.  -Recommended that patient use a Spacer, explained to patient what a spacer was and the benefits.  -Patient reports that she wants to confirm that a spacer is apart of her insurance benefits.    Patient Goals/Self-Care Activities . Over the next 60 days, patient will:  - take medications as prescribed collaborate with provider on medication access solutions  Follow Up Plan: Telephone follow up appointment with care management team member scheduled for: 09/18/2021 The patient has been provided with contact information for the care management team and has been advised to call with any health related questions or concerns.          Patient agreed to services and verbal consent obtained.   The patient verbalized understanding of instructions, educational materials, and care plan provided today and agreed to receive a mailed copy of patient instructions, educational materials, and care plan.   [name], PharmD Clinical Pharmacist [facility] [phone]

## 2021-05-15 ENCOUNTER — Telehealth: Payer: Self-pay

## 2021-05-15 NOTE — Telephone Encounter (Signed)
Called patient to confirm Ozempic is the correct dosage of 1 mg, patient reported that her mediation is correct. She did have an issue this year with 0.5 mg pen being sent to her. She reports that she was uncertain of

## 2021-05-18 ENCOUNTER — Encounter: Payer: Self-pay | Admitting: Internal Medicine

## 2021-05-24 NOTE — Telephone Encounter (Signed)
Called patient to confirm Ozempic is the correct dosage of 1 mg, patient reported that her mediation is correct. She did have an issue this year with 0.5 mg pen being sent to her. I explained to patient that the pen was a sample pen provided by the clinic to make sure she had medication.   [name], PharmD Clinical Pharmacist [facility] [phone]

## 2021-05-30 ENCOUNTER — Telehealth: Payer: Self-pay

## 2021-05-30 NOTE — Telephone Encounter (Signed)
Attempted to call pt, left vm pt assistance ready for pickup.

## 2021-06-04 ENCOUNTER — Ambulatory Visit (INDEPENDENT_AMBULATORY_CARE_PROVIDER_SITE_OTHER): Payer: Medicare Other | Admitting: Internal Medicine

## 2021-06-04 ENCOUNTER — Encounter: Payer: Self-pay | Admitting: Internal Medicine

## 2021-06-04 DIAGNOSIS — R059 Cough, unspecified: Secondary | ICD-10-CM

## 2021-06-04 MED ORDER — OXYCODONE HCL 5 MG PO TABS: 5.0000 mg | ORAL_TABLET | ORAL | 0 refills | Status: DC | PRN

## 2021-06-04 NOTE — Telephone Encounter (Signed)
Dr. [name], Patient sent her cell number for you to call during lunch.  Please call my cell for televisit. [phone].

## 2021-06-04 NOTE — Progress Notes (Signed)
Subjective:   Patient ID: [name], female    DOB: [birth year]    MRN: [id number]    Brief patient profile:  52  yobf mother of a PA @ [place]  fast med  quit smoking 2007 with chronic cough x 2011 self referred for evaluation of persistent cough in setting of technically GOLD II copd dx 02/2014 with only mild obstructive pattern - has noted since high school a tendency to daily  tickle in throat causing intermittent cough waxing and waning ever since    History of Present Illness  10/19/2014 1st [facility] office visit/ [name]   Chief Complaint  Patient presents with  . Pulmonary Consult    Former pt of Dr. Annamaria Boots. Pt c/o increased cough x 3 wks. Cough is prod with moderate, thick, light yellow sputum.  She coughs until loses urinary continence.  Not sleeping well due to cough.   remembers having tendency to throat tickle in high school  but completely resolved by graduation  then recurred in her 50s and resolved with codeine then recurred in 2011  but bad to worse x 3 weeks assoc with sensation of choking, mucus is thick light yellow.  acei stopped about a month prior to OV  Replaced by losartan  Inhalers make it worse, esp dpi  Kouffman Reflux v Neurogenic Cough Differentiator Reflux Comments  Do you awaken from a sound sleep coughing violently?                            With trouble breathing? Yes   Do you have choking episodes when you cannot  Get enough air, gasping for air ?              Yes   Do you usually cough when you lie down into  The bed, or when you just lie down to rest ?                          Yes   Do you usually cough after meals or eating?         maybe   Do you cough when (or after) you bend over?    No    GERD SCORE     Kouffman Reflux v Neurogenic Cough Differentiator Neurogenic   Do you more-or-less cough all day long? yes   Does change of temperature make you cough? no   Does laughing or chuckling cause you to cough? no   Do fumes (perfume,  automobile fumes, burned  Toast, etc.,) cause you to cough ?      Not aware   Does speaking, singing, or talking on the phone cause you to cough   ?               sometimes   Neurogenic/Airway score      rec  Max rx gerd/prn tramadol        09/15/2017  Transition of care  f/u ov/[name] re: COPD II s/p aecopd Chief Complaint  Patient presents with  . Hospitalization Follow-up    Breathing has improved some, but not back at her normal baseline. She states her cough woke her up this am and had some bloody nasal d/c.  She has been coughing up some thick, yellow sputum.  She is using   walking all over [place] fine,  Flew back to [place]  on a commercial jet and short  of breath  walking from gate to baggage [place] then by 2 days later in ER due to severe sob / tightness across upper back >> albuterol relived the discomfort w/in 5 min but did not have her rescue so had to call 911 (proair listed on med calendar she just got from me 08/19/17 under written action plan) - says was so short of breath at that point could not have used saba anyway. Not following action plan either re controlling cough which is also worse/ has not started meds from discharge  rec Plan A = Automatic = symbicort 80 Take 2 puffs first thing in am and then another 2 puffs about 12 hours later.  Work on Interior and spatial [employer]: Plan B = Backup Only use your albuterol as a rescue medication       03/23/2018 acute extended  ov/[name] re:   Copd  - extended ov re new noct cough /wheeze and need for saba / no med calendar Chief Complaint  Patient presents with  . Acute Visit    Breathing is overall doing well. She uses her albuterol inhaler 4 x per wk on average. She has not needed her neb.  Mid February breathing /coughing  worse / wheezing every noct needs albuterol every noct Very confused with instructions/ now has neb but did not disclose med she uses at the med calendar ov and did not bring the med calendar with her  nor the med for the nebulizer and easily frustrated re concept of med reconciliation  Daytime sob = MMRC2 = can't walk a nl pace on a flat grade s sob but does fine slow and flat  rec Plan A = Automatic = symbicort 160 Take 2 puffs first thing in am and then another 2 puffs about 12 hours later.  Plan B = Backup Only use your albuterol as a rescue medication  Plan C = Crisis - only use your albuterol nebulizer if you first try Plan B and it fails to help > ok to use the nebulizer up to every 4 hours but if start needing it regularly call for immediate appointment     04/20/2018  f/u ov/[name] re:   Copd gold II / brought med calendar but not clear using consistently / chronic cough/ hbp Chief Complaint  Patient presents with  . Follow-up    COPD , feel good no problems at this time  Dyspnea:  Dances   2- 3 songs / shopping is ok  Cough: better, mostly dry/ throat clearing / noct  Tessalon maybe once every couple days  "it says to take it 3 x daily"  Sleep: better  while maint on symbicort 160 2bid Sleeping fine on 1 pillow     Symbicort 80 or 160 should be  Take 2 puffs first thing in am and then another 2 puffs about 12 hours later- you can pick which one you prefer but if  they are the same then use the lower strength Please schedule a follow up visit in 3 months but call sooner if needed     07/22/2018  f/u ov/[name] re:   GOLD II copd / has calendar not using the action plans at bottom/ since previous ov stopped lyrica and took gabapentin 100 tid x 3 weeks then stopped (was supposed to stay on until ov) Chief Complaint  Patient presents with  . Follow-up    Cough has improved slightly but "still driving me crazy"- prod with clear sputum.  She  uses her proair 2 x per wk and rarely uses neb.   Dyspnea:   MMRC2 = can't walk a nl pace on a flat grade s sob but does fine slow and flat  Cough  Tickle/ gag anytime day except doesn't wake her up goes back to childhood SABA use: twice daily on  bad days but recently less / hardly ever uses neb  Says only thing that stops her cough is oxycodone rec Take delsym two tsp every 12 hours and supplement if needed with  Oxycodone  up to 1 every 4 hours to suppress the urge to cough.  Once you have eliminated the cough for 3 straight days try reducing the oxycodone first,  then the delsym as tolerated.   For drainage / throat tickle try take CHLORPHENIRAMINE  4 mg - take one every 4 hours as needed   Reduce symbicort 80 Take 2 puffs first thing in am and then another 2 puffs about 12 hours later. > did not continue consistently "you didn't make this clear"  Gabapentin 300 mg four times a day - if not doing better we need to refer you to [facility] / Dr Joya Gaskins > did not do  See [name] in 6  weeks with all your medications>  Did not do    01/07/2019  Acute extended  ov/[name] re:  Flare of wheeze off symbicort, cough never resolved / no med calendar/ poor insight into meds/ action plans  Chief Complaint  Patient presents with  . Acute Visit    pt c/o wheezing, prod cough with gray/white mucus, pnd, stuffy nose.   Dyspnea:  MMRC3 = can't walk 100 yards even at a slow pace at a flat grade s stopping due to sob   Cough: severe fits of coughing day and noc  Sleeping: poorly due to cough and wheeze SABA use: qid instead of symbicort  rec Plan A = Automatic = Symbicort 80 Take 2 puffs first thing in am and then another 2 puffs about 12 hours later.  Plan B = Backup (add this to Plan A, not in place of it)  Only use your albuterol inhaler as a rescue medication Plan C = Crisis - only use your albuterol nebulizer if you first try Plan B For drainage / throat tickle try take CHLORPHENIRAMINE  4 mg(Chlortab at [facility]) -  Take delsym two tsp every 12 hours and supplement if needed with  vicodine up to 2 every 4 hours to suppress the urge to cough. Swallowing water and/or using ice chips/non mint and menthol containing candies (such as lifesavers or  sugarless jolly ranchers) are also effective.  You should rest your voice and avoid activities that you know make you cough. Once you have eliminated the cough for 3 straight days try reducing the vicodin first,  then the delsym as tolerated.   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 We discussed treatment options including observation, continuing PPI therapy, an evaluation in the voice lab to assess her candidacy for voice therapy and respiratory retraining, or a trial of tramadol.  04/16/2019 acute extended ov/ [name] re recurrent cough on gabapentin 300 tid / did not fill tramadol "I told Dr Joya Gaskins it wouldn't work" "only better with oxycodone"  Chief Complaint  Patient presents with  . Acute Visit    Increased cough x 3 wks- prod with white sputum. She has had some wheezing also. She is using her albuterol inhaler at least once per day and rarely uses neb.  Dyspnea:  Minimal  Cough: worse tickle/ no h1 on hand says plans to pick it up  Sleeping: fine 2 pillows / not typically waking her up  SABA use: rarely  02: none  Doesn't think delsym helped/ no h1 on hand / using gabapentin 300 mg tid   gen chest discomfort with severe coughing fits  rec Prednisone 10 mg take  4 each am x 2 days,   2 each am x 2 days,  1 each am x 2 days and stop  Take delsym two tsp every 12 hours and supplement if needed with oxidocone 5 mg up to 2 every 4 hours to suppress the urge to cough.   Once you have eliminated the cough for 3 straight days try reducing the oxycodone first,  then the delsym as tolerated.   Keep your appts with speech therapy  Please schedule a follow up visit in  3 months but call sooner if needed  with all medications /inhalers/ solutions in hand so we can verify exactly what you are taking. This includes all medications from all doctors and over the counters     11/21/2020  f/u ov/[name] re:  GOLD II / breztri better than symbicort  Chief Complaint  Patient presents with  . Follow-up    She states she has alot of SOB when she wakes up in the morning. She rarely uses her albuterol inhaler, but has been using neb almost every night.   Dyspnea:  Gets up around 630 am does better after taking am breztri   Cough: sporadic but dry  / less need for tramadol on gabapentin usually just taking 300 bid  Sleeping: fine 2 pillows  SABA use: every other night sev hours p supper  feels like needs nebulizer even p pm dose of breztri 02: none rec Change gabapentin to 300 mg 4 x daily  Change pepcid 20 mg after supper  Only use your albuterol as a rescue medication  Bed blocks are strongly recommended    02/26/2021  f/u ov/[name] re:  COPD  Gold II/ beztri 2bid/ rarely using saba  Chief Complaint  Patient presents with  . Follow-up    Doing well  Dyspnea: 20 min walking in place daily  Cough: no am cough Sleeping: no bed blocks but not resp issues  SABA use: rarely  02: none  Covid status:   vax x 3 last 11/2020  rec No change rx    Virtual Visit via Telephone Note 06/04/2021   I connected with [name] on 06/04/21 at 11:45 AM EDT by telephone and verified that I am speaking with the correct person using two identifiers. Pt is at home and this call made from my office with no other participants    I discussed the limitations, risks, security and privacy concerns of performing an evaluation and management service by telephone and the availability of in person appointments. I also discussed with the patient that there may be a patient responsible charge related to this service. The patient expressed understanding and agreed to proceed.  History of  Present Illness: Chronic cough better on gabapentin 300 mg bid / breztri  Then May 16 cough worse and Pos Cov May 18th positive no fever /  No sob / rx pred x 6 days no better  Dyspnea:  More limited by cough / nose starts running / white mucus  Cough: dry hacking  Sleeping: 2-3 pillows  SABA use: on breztri 2bid uses saba hfa some not neb  02: none    No obvious day to day or daytime variability or assoc excess/ purulent sputum or mucus plugs or hemoptysis or cp or chest tightness, subjective wheeze or overt sinus or hb symptoms.    Also denies any obvious fluctuation of symptoms with weather or environmental changes or other aggravating or alleviating factors except as outlined above.   Meds reviewed/ med reconciliation completed     No outpatient medications have been marked as taking for the 06/04/21 encounter (Office Visit) with Tammy Rockers, MD.         Observations/Objective: Talking full sentences / spont dry cough/ slt hoarse    Assessment and Plan: See problem list for active a/p's    Follow Up Instructions: See avs for instructions unique to this ov which includes revised/ updated med list     I discussed the assessment and treatment plan with the patient. The patient was provided an opportunity to ask questions and all were answered. The patient agreed with the plan and demonstrated an understanding of the instructions.   The patient was advised to call back or seek an in-person evaluation if the symptoms worsen or if the condition fails to improve as anticipated.  I provided  25 minutes of non-face-to-face time during this encounter.   Tammy Gully, MD

## 2021-06-04 NOTE — Assessment & Plan Note (Signed)
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 - try off cozar 11/03/2014 >>> - neurontin 100 tid rx 11/30/14 > improved 01/03/2015 > changed to lyrica 04/2015 but not taking consistently as of 01/30/2016  - sinus CT 02/17/15 > neg  - FENO  04/14/17 =  14  - rechallenged with gabapentin 300 qid 07/22/2018 and if not better > refer to Dr Joya Gaskins at [facility] voice center > did not go, referred again 01/07/2019 > see eval 03/02/2019 rec speech therapy/tramadol (pt refused latter)   - flare early Sep 2021 off gabapentin and on breztri > changed back to gabapentin / symb 80 2bid > preferred breztri  - 11/21/2020 rec increase gabapentin to 300 qid to eliminate need for tramadol  - 12/28/2020 acute breakthru despite following instructions x for bed blocks > rx oxy 5 mg q 4 hr prn  Was well controlled now flare p covid May 2022  Of the three most common causes of  Sub-acute / recurrent or chronic cough, only one (GERD)  can actually contribute to/ trigger  the other two (asthma and post nasal drip syndrome)  and perpetuate the cylce of cough.  While not intuitively obvious, many patients with chronic low grade reflux do not cough until there is a primary insult that disturbs the protective epithelial barrier and exposes sensitive nerve endings.   This is typically viral but can due to PNDS and  Former likely applies here.   The point is that once this occurs, it is difficult to eliminate the cycle  using anything but a maximally effective acid suppression regimen at least in the short run, accompanied by an appropriate diet to address non acid GERD and control / eliminate the cough itself for at least 3 days with oxycodone >>> also so added 6 day taper off  Prednisone starting at 40 mg per day in case of component of Th-2 driven upper or lower airways inflammation (if cough responds short term only to relapse before return while will  on full rx for uacs (as above), then  that would point to allergic rhinitis/ asthma or eos bronchitis as alternative dx)   Each maintenance medication was reviewed in detail including most importantly the difference between maintenance and as needed and under what circumstances the prns are to be used.  Please see AVS for specific  Instructions which are unique to this visit and I personally typed out  which were reviewed in detail over the phone with the patient and a copy provided via my chart

## 2021-06-04 NOTE — Telephone Encounter (Signed)
No can do s at least a televisit > if she's willing add on to the morning and I'll try to call her at lunch

## 2021-06-04 NOTE — Patient Instructions (Addendum)
Work on inhaler technique:  relax and gently blow all the way out then take a nice smooth full deep breath back in, triggering the inhaler at same time you start breathing in.  Hold for up to 5 seconds if you can. Blow out thru nose. Rinse and gargle with water when done.  If mouth or throat bother you at all,  try brushing teeth/gums/tongue with arm and hammer toothpaste/ make a slurry and gargle and spit out.   For cough >    mucinex dm  1200 mg every 12 hours and supplement with oxycodone 5 mg every 4 hours if needed  GERD (REFLUX)  is an extremely common cause of respiratory symptoms just like yours , many times with no obvious heartburn at all.    It can be treated with medication, but also with lifestyle changes including elevation of the head of your bed (ideally with 6 -8inch blocks under the headboard of your bed),  Smoking cessation, avoidance of late meals, excessive alcohol, and avoid fatty foods, chocolate, peppermint, colas, red wine, and acidic juices such as orange juice.  NO MINT OR MENTHOL PRODUCTS SO NO COUGH DROPS  USE SUGARLESS CANDY INSTEAD (Jolley ranchers or Stover's or Life Savers) or even ice chips will also do - the key is to swallow to prevent all throat clearing. NO OIL BASED VITAMINS - use powdered substitutes.  Avoid fish oil when coughing.    Increase gabentin to 300  Mg four times a day until better then back to twice daily

## 2021-06-04 NOTE — Telephone Encounter (Signed)
Dr. [name],  This message was sent this morning.  Going through coughing episode again, going on 4th week (coughing, gaging, heaving, expelling thick mucus off white color, wheezing). Husband tested positive from home covid test. Next day (May 19) I also tested positive. My only symptoms were coughing and tiredness. I am requesting oxycodone to relieve cough. I still have cough but now it is keeping me from sleeping past 3 nights. My last script for oxycodone was in December 2021. It has been proven over and over that the ONLY thing that takes cough away is oxy.  My phone number is [phone]. Look forward to hearing from you soon. Thank you.   Message routed to Dr. [name] to advise

## 2021-06-05 MED ORDER — OXYCODONE HCL 5 MG PO TABS: 5.0000 mg | ORAL_TABLET | ORAL | 0 refills | Status: DC | PRN

## 2021-06-05 NOTE — Telephone Encounter (Signed)
Let her know I resubmitted it

## 2021-06-14 ENCOUNTER — Encounter: Payer: Self-pay | Admitting: Internal Medicine

## 2021-06-19 ENCOUNTER — Telehealth: Payer: Self-pay

## 2021-06-19 ENCOUNTER — Encounter: Payer: Self-pay | Admitting: Internal Medicine

## 2021-06-19 MED ORDER — PREDNISONE 10 MG PO TABS: ORAL_TABLET | ORAL | 0 refills | Status: DC

## 2021-06-19 NOTE — Telephone Encounter (Signed)
The pt wanted to know the name of the ENT that she was referred to.  The pt was told that she was referred to Dr. Addison Bailey by Dr. Marlene Lard.  The pt said that she didn't want to go to [place]. The pt was told that Dr Baird Cancer also replied to the pt's mychart message and sent the name of Dr Melony Overly here in [place].

## 2021-06-19 NOTE — Telephone Encounter (Signed)
Received the following message from patient:   "The following is for your information. I have communicated this to Dr. Baird Cancer and am now waiting for referral from her: I have had 3 severe nose bleeds in the past 6 days. Each occurring after coughing. These nose bleeds produced blood clots that were expelled through my nose and mouth. I am deathly afraid to go to the ENT because I have a strong aversion to anything being in my nose but I know something has to be done.   One of the side affects of Breztri is inflammation of the sinus. It may not be related at all but these nose bleeds started after you told me to exhale through my nose instead of mouth. Should I continue to do this and/or should I stop using Breztri temporarily until nose bleeds get under control. But then again it may not be related. Also, I am having to use nebulizer every morning before I can start the day. I wake up with labored breathing (last 7-8 days) but it doesn't interrupt my sleep. I have a lot going on in a short amount of time. I am really afraid of these nose bleeds. Something definitely is wrong."  MW, can you please advise? Thanks.

## 2021-06-19 NOTE — Telephone Encounter (Signed)
Ok to stop blowing the breztri thru the nose just be sure to keep the mouth rinsed to prevent irritation from the breztri as it is quite strong  For the breathing ok to try calling in Prednisone 10 mg take  4 each am x 2 days,   2 each am x 2 days,  1 each am x 2 days and stop

## 2021-07-12 ENCOUNTER — Telehealth: Payer: Self-pay

## 2021-07-12 NOTE — Chronic Care Management (AMB) (Signed)
Chronic Care Management Pharmacy Assistant   Name: [name]  MRN: [id number] DOB: 05-25-1946   Reason for Encounter: Disease State/ Hypertension  Recent office visits:  None  Recent consult visits:  06-04-2021 Tanda Rockers, MD (Pulmonary). STOP chlorpheniramine 4 mg. START musinex DM 1200 mg every 12 hours as needed and Oxycodone 5 mg every 4 hours as needed. INCREASE gabapentin 300 mg four times daily until better then back down to twice daily.  Hospital visits:  None in previous 6 months  Medications: Outpatient Encounter Medications as of 07/12/2021  Medication Sig Note   Accu-Chek FastClix Lancets MISC Inject 1 each as directed daily. Use as directed to check blood sugars daily dx: e11.22    albuterol (VENTOLIN HFA) 108 (90 Base) MCG/ACT inhaler Inhale 2 puffs into the lungs every 4 (four) hours as needed for wheezing or shortness of breath.    aspirin 81 MG tablet Take 81 mg by mouth daily.    Biotin 10000 MCG TBDP Take by mouth.    bisoprolol (ZEBETA) 5 MG tablet TAKE 1/2 TABLET(2.5 MG) BY MOUTH DAILY    Budeson-Glycopyrrol-Formoterol (BREZTRI AEROSPHERE) 160-9-4.8 MCG/ACT AERO Inhale 2 puffs into the lungs 2 (two) times daily.    CALCIUM-MAGNESIUM-ZINC PO Take 1 capsule by mouth daily.    Cholecalciferol (VITAMIN D3) 2000 UNITS TABS Take 2,000 Units by mouth daily.    famotidine (PEPCID) 20 MG tablet One after supper    gabapentin (NEURONTIN) 300 MG capsule TAKE 1 CAPSULE(300 MG) BY MOUTH FOUR TIMES DAILY 05/08/2021: Taking 1 capsule three times per day.    glucose blood (ACCU-CHEK GUIDE) test strip 1 each by Other route daily. Use as instructed to check blood sugars 1 time per day dx: e11.22    montelukast (SINGULAIR) 10 MG tablet One tab po qd    oxyCODONE (OXY IR/ROXICODONE) 5 MG immediate release tablet Take 1 tablet (5 mg total) by mouth every 4 (four) hours as needed (cough).    pantoprazole (PROTONIX) 40 MG tablet TAKE 1 TABLET BY MOUTH 30 MINS BEFORE FIRST  MEAL OF THE DAY    predniSONE (DELTASONE) 10 MG tablet Take 4 tabs x 2 days, 2 tabs x 2 days, then 1 tab x 2 days and stop.    rosuvastatin (CRESTOR) 20 MG tablet TAKE 1 TABLET(20 MG) BY MOUTH AT BEDTIME    Semaglutide, 1 MG/DOSE, (OZEMPIC, 1 MG/DOSE,) 4 MG/3ML SOPN Inject 1 mg into the skin once a week.    No facility-administered encounter medications on file as of 07/12/2021.   Reviewed chart prior to disease state call. Spoke with patient regarding BP  Recent Office Vitals: BP Readings from Last 3 Encounters:  04/18/21 130/72  04/12/21 122/68  04/04/21 116/68   Pulse Readings from Last 3 Encounters:  04/18/21 72  04/12/21 68  04/04/21 76    Wt Readings from Last 3 Encounters:  05/01/21 197 lb (89.4 kg)  04/18/21 197 lb 3.2 oz (89.4 kg)  04/12/21 195 lb 6.4 oz (88.6 kg)     Kidney Function Lab Results  Component Value Date/Time   CREATININE 0.86 04/04/2021 03:28 PM   CREATININE 0.83 01/04/2021 02:50 PM   GFRNONAA 70 01/04/2021 02:50 PM   GFRAA 81 01/04/2021 02:50 PM    BMP Latest Ref Rng & Units 04/04/2021 01/04/2021 09/06/2020  Glucose 65 - 99 mg/dL 74 96 72  BUN 8 - 27 mg/dL 5(L) 8 5(L)  Creatinine 0.57 - 1.00 mg/dL 0.86 0.83 0.76  BUN/Creat Ratio 12 -  28 6(L) 10(L) 7(L)  Sodium 134 - 144 mmol/L 143 142 141  Potassium 3.5 - 5.2 mmol/L 5.4(H) 5.1 4.4  Chloride 96 - 106 mmol/L 101 101 99  CO2 20 - 29 mmol/L 26 27 28   Calcium 8.7 - 10.3 mg/dL 10.1 9.7 9.6    Current antihypertensive regimen:  Bisoprolol 5 mg - taking 1/2 tablet by mouth daily  How often are you checking your Blood Pressure? infrequently. Patient states she doesn't check blood pressure at home but was checking it a few weeks ago due to nose bleeds.   Current home BP readings: Patient stated her last blood pressure was normal but couldn't remember reading.  What recent interventions/DTPs have been made by any provider to improve Blood Pressure control since last CPP Visit:  Educated on BP goals and  benefits of medications for prevention of heart attack, stroke and kidney damage Daily salt intake goal < 2300 mg  Any recent hospitalizations or ED visits since last visit with CPP? No  What diet changes have been made to improve Blood Pressure Control?  Patient states she has been limiting her salt intake  What exercise is being done to improve your Blood Pressure Control?  Patient states she has be short of breath lately and using nebulizer twice daily preventing her from exercising. Patient states Dr. [name] is aware of her issue.  Adherence Review: Is the patient currently on ACE/ARB medication? No Does the patient have >5 day gap between last estimated fill dates? No   Care Gaps: Dose 4 covid booster overdue RAF= 1.318% Medicare well visit 09-12-2021  Star Rating Drugs: Ozempic 1 mg- Patient assistance Rosuvastatin 20 mg- Last filled 04-23-2021 90 DS [place] Clinical Pharmacist Assistant [phone]

## 2021-07-13 ENCOUNTER — Telehealth: Payer: Self-pay

## 2021-07-13 MED ORDER — PREDNISONE 10 MG PO TABS: ORAL_TABLET | ORAL | 0 refills | Status: DC

## 2021-07-13 NOTE — Telephone Encounter (Signed)
ATC r/t more detail on current symptoms. Left voicemail for pt to return call to office.

## 2021-07-13 NOTE — Telephone Encounter (Signed)
ATC left voicemail for pt to return call to off r/t My Chart message on 07/13/21.

## 2021-07-13 NOTE — Telephone Encounter (Signed)
Called and spoke with patient. She verbalized understanding. RX for prednisone has been sent in.   Nothing further needed at time of call.

## 2021-07-13 NOTE — Telephone Encounter (Signed)
Pt sent the following My Chart message:    I have had to use the nebulizer (Neb) every day, 2-3 times a day, since I tested positive for Covid. As soon as I wake up and start to walk, I feel the very heavy weight and labored breathing and have to start each day on the Neb. I'm ok during the day if I'm sitting or laying down but when I move around, it starts again. I'm also coughing and wheezing a lot along with a scratchy throat. As usual, every time I cough, my nose gets full ( dripping full) of mucus. The Neb has never been a part of my daily routine. Now I can't get thru a day without it. I'm getting scared. Do I need to see another kind of specialist? Thank you.  Spoke with pt who states "labored breathing" has been going on since testing postivtive for Covid (home test not sure when). Pt denies F/C/N/V/D. Dr. [name] please advise.

## 2021-07-13 NOTE — Telephone Encounter (Signed)
Would rec another prednisone: Prednisone 10 mg take  4 each am x 2 days,   2 each am x 2 days,  1 each am x 2 days and stop   Ov with all meds next available and happy for her  to see one of my partners instead if she wishes.  In meantime max dose of albuterol is up to every 4 hours nebulized

## 2021-07-20 NOTE — Telephone Encounter (Signed)
Televisit as add on next week if cna't get in otherwise - Monday is fine

## 2021-07-20 NOTE — Telephone Encounter (Signed)
Dr. [name], please advise on mychart message below, thanks!     Good morning. My last dose of prednisone was this past week. It worked wonders. Did not have to use nebulizer once. I felt a slight heaviness in my breathing this morning.not enough to use nebulizer but I'm getting concerned that it's going to start all over. My main concern is I go on vacation this coming Thursday. I would like/need to take prednisone and oxycodone (for cough) with me. Therefore if I need to make appointment or do something else to be able to get these scripts for my vacation, please let me know. I need to be fully  prepared while I'm out of town. Thank you. I can be reached on my cell: [phone].

## 2021-07-23 ENCOUNTER — Other Ambulatory Visit: Payer: Self-pay

## 2021-07-23 ENCOUNTER — Encounter: Payer: Self-pay | Admitting: Internal Medicine

## 2021-07-23 ENCOUNTER — Ambulatory Visit (INDEPENDENT_AMBULATORY_CARE_PROVIDER_SITE_OTHER): Payer: Medicare Other | Admitting: Internal Medicine

## 2021-07-23 ENCOUNTER — Ambulatory Visit: Payer: Medicare Other

## 2021-07-23 DIAGNOSIS — R059 Cough, unspecified: Secondary | ICD-10-CM

## 2021-07-23 DIAGNOSIS — J449 Chronic obstructive pulmonary disease, unspecified: Secondary | ICD-10-CM | POA: Diagnosis not present

## 2021-07-23 MED ORDER — PREDNISONE 10 MG PO TABS: ORAL_TABLET | ORAL | 0 refills | Status: DC

## 2021-07-23 MED ORDER — GABAPENTIN 300 MG PO CAPS: 300.0000 mg | ORAL_CAPSULE | Freq: Four times a day (QID) | ORAL | 5 refills | Status: DC

## 2021-07-23 NOTE — Progress Notes (Signed)
Subjective:   Patient ID: [name], female    DOB: October 12, 1946    MRN: [id number]    Brief patient profile:  71  yobf mother of a PA @ [place]  fast med  quit smoking 2007 with chronic cough x 2011 self referred for evaluation of persistent cough in setting of technically GOLD II copd dx 02/2014 with only mild obstructive pattern - has noted since high school a tendency to daily  tickle in throat causing intermittent cough waxing and waning ever since    History of Present Illness  10/19/2014 1st [place] [facility] office visit/ Gabbie Marzo   Chief Complaint  Patient presents with   Pulmonary Consult    Former pt of Dr. Annamaria Boots. Pt c/o increased cough x 3 wks. Cough is prod with moderate, thick, light yellow sputum.  She coughs until loses urinary continence.  Not sleeping well due to cough.   remembers having tendency to throat tickle in high school  but completely resolved by graduation  then recurred in her 83s and resolved with codeine then recurred in 2011  but bad to worse x 3 weeks assoc with sensation of choking, mucus is thick light yellow.  acei stopped about a month prior to OV  Replaced by losartan  Inhalers make it worse, esp dpi  Kouffman Reflux v Neurogenic Cough Differentiator Reflux Comments  Do you awaken from a sound sleep coughing violently?                            With trouble breathing? Yes   Do you have choking episodes when you cannot  Get enough air, gasping for air ?              Yes   Do you usually cough when you lie down into  The bed, or when you just lie down to rest ?                          Yes   Do you usually cough after meals or eating?         maybe   Do you cough when (or after) you bend over?    No    GERD SCORE     Kouffman Reflux v Neurogenic Cough Differentiator Neurogenic   Do you more-or-less cough all day long? yes   Does change of temperature make you cough? no   Does laughing or chuckling cause you to cough? no   Do fumes (perfume,  automobile fumes, burned  Toast, etc.,) cause you to cough ?      Not aware   Does speaking, singing, or talking on the phone cause you to cough   ?               sometimes   Neurogenic/Airway score    Rec  Max rx gerd/prn tramadol        09/15/2017  Transition of care  f/u ov/Nola Botkins re: COPD II s/p aecopd Chief Complaint  Patient presents with   Hospitalization Follow-up    Breathing has improved some, but not back at her normal baseline. She states her cough woke her up this am and had some bloody nasal d/c.  She has been coughing up some thick, yellow sputum.  She is using   walking all over [place] fine,  Flew back to [place]  on a commercial jet and short of breath  walking from gate to baggage claim then by 2 days later in ER due to severe sob / tightness across upper back >> albuterol relived the discomfort w/in 5 min but did not have her rescue so had to call 911 (proair listed on med calendar she just got from me 08/19/17 under written action plan) - says was so short of breath at that point could not have used saba anyway. Not following action plan either re controlling cough which is also worse/ has not started meds from discharge  rec Plan A = Automatic = symbicort 80 Take 2 puffs first thing in am and then another 2 puffs about 12 hours later.  Work on Interior and spatial designer: Plan B = Backup Only use your albuterol as a rescue medication       03/23/2018 acute extended  ov/Onie Kasparek re:   Copd  - extended ov re new noct cough /wheeze and need for saba / no med calendar Chief Complaint  Patient presents with   Acute Visit    Breathing is overall doing well. She uses her albuterol inhaler 4 x per wk on average. She has not needed her neb.  Mid February breathing /coughing  worse / wheezing every noct needs albuterol every noct Very confused with instructions/ now has neb but did not disclose med she uses at the med calendar ov and did not bring the med calendar with her nor the  med for the nebulizer and easily frustrated re concept of med reconciliation  Daytime sob = MMRC2 = can't walk a nl pace on a flat grade s sob but does fine slow and flat  rec Plan A = Automatic = symbicort 160 Take 2 puffs first thing in am and then another 2 puffs about 12 hours later.  Plan B = Backup Only use your albuterol as a rescue medication  Plan C = Crisis - only use your albuterol nebulizer if you first try Plan B and it fails to help > ok to use the nebulizer up to every 4 hours but if start needing it regularly call for immediate appointment     04/20/2018  f/u ov/Yolanda Dockendorf re:   Copd gold II / brought med calendar but not clear using consistently / chronic cough/ hbp Chief Complaint  Patient presents with   Follow-up    COPD , feel good no problems at this time  Dyspnea:  Dances   2- 3 songs / shopping is ok  Cough: better, mostly dry/ throat clearing / noct  Tessalon maybe once every couple days  "it says to take it 3 x daily"  Sleep: better  while maint on symbicort 160 2bid Sleeping fine on 1 pillow     Symbicort 80 or 160 should be  Take 2 puffs first thing in am and then another 2 puffs about 12 hours later- you can pick which one you prefer but if  they are the same then use the lower strength Please schedule a follow up visit in 3 months but call sooner if needed     07/22/2018  f/u ov/Erskin Zinda re:   GOLD II copd / has calendar not using the action plans at bottom/ since previous ov stopped lyrica and took gabapentin 100 tid x 3 weeks then stopped (was supposed to stay on until ov) Chief Complaint  Patient presents with   Follow-up    Cough has improved slightly but "still driving me crazy"- prod with clear sputum.  She uses her proair  2 x per wk and rarely uses neb.   Dyspnea:   MMRC2 = can't walk a nl pace on a flat grade s sob but does fine slow and flat  Cough  Tickle/ gag anytime day except doesn't wake her up goes back to childhood SABA use: twice daily on bad days  but recently less / hardly ever uses neb  Says only thing that stops her cough is oxycodone rec Take delsym two tsp every 12 hours and supplement if needed with  Oxycodone  up to 1 every 4 hours to suppress the urge to cough.  Once you have eliminated the cough for 3 straight days try reducing the oxycodone first,  then the delsym as tolerated.   For drainage / throat tickle try take CHLORPHENIRAMINE  4 mg - take one every 4 hours as needed   Reduce symbicort 80 Take 2 puffs first thing in am and then another 2 puffs about 12 hours later. > did not continue consistently "you didn't make this clear"  Gabapentin 300 mg four times a day - if not doing better we need to refer you to [facility] ENT / Dr Joya Gaskins > did not do  See Tammy NP in 6  weeks with all your medications>  Did not do    01/07/2019  Acute extended  ov/Matisyn Cabeza re:  Flare of wheeze off symbicort, cough never resolved / no med calendar/ poor insight into meds/ action plans  Chief Complaint  Patient presents with   Acute Visit    pt c/o wheezing, prod cough with gray/white mucus, pnd, stuffy nose.   Dyspnea:  MMRC3 = can't walk 100 yards even at a slow pace at a flat grade s stopping due to sob   Cough: severe fits of coughing day and noc  Sleeping: poorly due to cough and wheeze SABA use: qid instead of symbicort  rec Plan A = Automatic = Symbicort 80 Take 2 puffs first thing in am and then another 2 puffs about 12 hours later.  Plan B = Backup (add this to Plan A, not in place of it)  Only use your albuterol inhaler as a rescue medication Plan C = Crisis - only use your albuterol nebulizer if you first try Plan B For drainage / throat tickle try take CHLORPHENIRAMINE  4 mg(Chlortab at [facility]) -  Take delsym two tsp every 12 hours and supplement if needed with  vicodine up to 2 every 4 hours to suppress the urge to cough. Swallowing water and/or using ice chips/non mint and menthol containing candies (such as lifesavers or sugarless  jolly ranchers) are also effective.  You should rest your voice and avoid activities that you know make you cough. Once you have eliminated the cough for 3 straight days try reducing the vicodin first,  then the delsym as tolerated.   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 We discussed treatment options including observation, continuing PPI therapy, an evaluation in the voice lab to assess her candidacy for voice therapy and respiratory retraining, or a trial of tramadol.    04/16/2019  acute extended ov/ Kanitra Purifoy re recurrent cough on gabapentin 300 tid / did not fill tramadol "I told Dr Joya Gaskins it wouldn't work" "only better with oxycodone"  Chief Complaint  Patient presents with   Acute Visit    Increased cough x 3 wks- prod with white sputum. She has had some wheezing also. She is using her albuterol inhaler at least once per day and rarely uses neb.  Dyspnea:  Minimal  Cough: worse tickle/ no h1 on hand says plans to pick it up  Sleeping: fine 2 pillows / not typically waking her up  SABA use: rarely  02: none  Doesn't think delsym helped/ no h1 on hand / using gabapentin 300 mg tid   gen chest discomfort with severe coughing fits  rec Prednisone 10 mg take  4 each am x 2 days,   2 each am x 2 days,  1 each am x 2 days and stop  Take delsym two tsp every 12 hours and supplement if needed with oxidocone 5 mg up to 2 every 4 hours to suppress the urge to cough.   Once you have eliminated the cough for 3 straight days try reducing the oxycodone first,  then the delsym as tolerated.   Keep your appts with speech therapy  Please schedule a follow up visit in 3 months  but call sooner if needed  with all medications /inhalers/ solutions in hand so we can verify exactly what you are taking. This includes all medications from all doctors and over the counters     11/21/2020  f/u ov/Alese Furniss re:  GOLD II / breztri better than symbicort  Chief Complaint  Patient presents with   Follow-up    She states she has alot of SOB when she wakes up in the morning. She rarely uses her albuterol inhaler, but has been using neb almost every night.   Dyspnea:  Gets up around 630 am does better after taking am breztri   Cough: sporadic but dry  / less need for tramadol on gabapentin usually just taking 300 bid  Sleeping: fine 2 pillows  SABA use: every other night sev hours p supper  feels like needs nebulizer even p pm dose of breztri 02: none rec Change gabapentin to 300 mg 4 x daily  Change pepcid 20 mg after supper  Only use your albuterol as a rescue medication  Bed blocks are strongly recommended    02/26/2021  f/u ov/Markala Sitts re:  COPD  Gold II/ beztri 2bid/ rarely using saba  Chief Complaint  Patient presents with   Follow-up    Doing well  Dyspnea: 20 min walking in place daily  Cough: no am cough Sleeping: no bed blocks but not resp issues  SABA use: rarely  02: none  Covid status:   vax x 3 last 11/2020  rec No change rx    Virtual Visit via Telephone Note 06/04/2021   I connected with Hal Neer Feliciano on 06/04/21 at 11:45 AM EDT by telephone and verified that I am speaking with the correct person using two identifiers. Pt is at home and this call made from my office with no other participants    I discussed the limitations, risks, security and privacy concerns of performing an evaluation and management service by telephone and the availability of in person appointments. I also discussed with the patient that there may be a patient responsible charge related to this service. The patient expressed understanding and agreed to proceed.  History of Present  Illness: Chronic cough better on gabapentin 300 mg bid / breztri  Then May 16 cough worse and Pos Cov May 18th positive no fever /  No sob / rx pred x 6 days no better  Dyspnea:  More limited by cough / nose starts running / white mucus  Cough: dry hacking  Sleeping: 2-3 pillows  SABA use: on breztri 2bid uses saba hfa some not neb  02: none   Rec Work on inhaler technique:    For cough >    mucinex dm  1200 mg every 12 hours and supplement with oxycodone 5 mg every 4 hours if needed GERD diet reviewed, bed blocks rec  Increase gabentin to 300  Mg four times a day until better then back to twice daily     Virtual Visit via Telephone Note 07/23/2021   I connected with Hal Neer Breach on 07/23/21 at 8 15 am  by telephone and verified that I am speaking with the correct person using two identifiers. Pt is at home and this call made from my office with no other participants    I discussed the limitations, risks, security and privacy concerns of performing an evaluation and management service by telephone and the availability of in person appointments. I also discussed with the patient that there may be a patient responsible charge related to this service. The patient expressed understanding and agreed to proceed.   History of Present Illness: Stopped prednisone then w/in a few days back on nebulizer due to sob which improves "all day"   Still on gabapentin 300 mg bid/ says taking the breztri 2bid but the saba hfa not giving her any relief, only the neg  Dyspnea: improves p neb "all day"  Cough: no cough now ""but I always keep getting it back"  Sleeping: 3 pillows   SABA use: avg neb 1- 2 daily  02: none    No obvious day to day or daytime variability or assoc excess/ purulent sputum or mucus plugs or hemoptysis or cp or chest tightness, subjective wheeze or overt sinus or hb symptoms.    Also denies any obvious fluctuation of symptoms with weather or environmental changes or other  aggravating or alleviating factors except as outlined above.   Meds reviewed/ med reconciliation completed     No outpatient medications have been marked as taking for the 07/23/21 encounter (Appointment) with Tanda Rockers, MD.         Observations/Objective: Slt hoarse bf very verbose / no conversational sob.    Assessment and Plan: See problem list for active a/p's   Follow Up Instructions: See avs for instructions unique to this ov which includes revised/ updated med list     I discussed the assessment and treatment plan with the patient. The patient was provided an opportunity to ask questions and all were answered. The patient agreed with the plan and demonstrated an understanding of the instructions.   The patient was advised to call back or seek an in-person evaluation if the symptoms worsen or if the condition fails to improve as anticipated.  I provided 25  minutes of non-face-to-face time during this encounter.   [name], MD

## 2021-07-23 NOTE — Patient Instructions (Addendum)
Plan A = Automatic = Always=    Breztri Take 2 puffs first thing in am and then another 2 puffs about 12 hours later.    Gabapentin 300 mg four times daily until cough is gone then taper twice daily.   Plan B = Backup (to supplement plan A, not to replace it) Only use your albuterol inhaler as a rescue medication to be used if you can't catch your breath by resting or doing a relaxed purse lip breathing pattern.  - The less you use it, the better it will work when you need it. - Ok to use the inhaler up to 2 puffs  every 4 hours if you must but call for appointment if use goes up over your usual need - Don't leave home without it !!  (think of it like the spare tire for your car)   Plan C = Crisis (instead of Plan B but only if Plan B stops working) - only use your albuterol nebulizer if you first try Plan B and it fails to help > ok to use the nebulizer up to every 4 hours but if start needing it regularly call for immediate appointment  Prednisone 10 mg take  4 each am x 2 days,   2 each am x 2 days,  1 each am x 2 days and stop   Please remember to go to the  x-ray department  for your tests - we will call you with the results when they are available  Please schedule a follow up office visit in 6 weeks, call sooner if needed - bring all your medications /inhalers/ solutions

## 2021-07-23 NOTE — Assessment & Plan Note (Addendum)
Quit smoking 2007 PFT: 03/17/2014-  FEV1  1.28 (58%) ratio 66 and no better p saba, ERV 27 and DLCO 56 corrects to 77 - 11/30/2014   try dulera 100 2bid > 01/03/2015 not clear helping,  use prn > flare since mid 02/2015 cough > sob  - 04/07/2015 p extensive coaching HFA effectiveness =    90% > try dulera 100 2bid > improved 04/21/15   - 06/01/2015   try dulera 200 2bid  > did not maintain  - Spirometry 04/14/2017  FEV1 1.13 (58%)  Ratio 52 p saba w/in 4 h > add symb 80 2bid   - 08/19/2017    resumed sym 80 2bid -  03/23/2018    try symb 160 due to new noct cough/ wheeze > improved 04/20/2018  - 07/22/2018   try symb 80 2bid due to UACS - [facility] 2/267/20 rec add spiriva smi > pt declined    Was in good shape until covid in May 2022 now " pred dep ? Why"  rec Check cxr Prednisone 10 mg take  4 each am x 2 days,   2 each am x 2 days,  1 each am x 2 days and stop  RETURN 6 weeks with all meds in hand using a trust but verify approach to confirm accurate Medication  Reconciliation/ use of hfa. The principal here is that until we are certain that the  patients are doing what we've asked, it makes no sense to ask them to do more.  Continue breztri with approp saba:  I spent extra time with pt today reviewing appropriate use of albuterol for prn use on exertion with the following points: 1) saba is for relief of sob that does not improve by walking a slower pace or resting but rather if the pt does not improve after trying this first. 2) If the pt is convinced, as many are, that saba helps recover from activity faster then it's easy to tell if this is the case by re-challenging : ie stop, take the inhaler, then p 5 minutes try the exact same activity (intensity of workload) that just caused the symptoms and see if they are substantially diminished or not after saba 3) if there is an activity that reproducibly causes the symptoms, try the saba 15 min before the activity on alternate days   If in  fact the saba really does help, then fine to continue to use it prn but advised may need to look closer at the maintenance regimen being used to achieve better control of airways disease with exertion.

## 2021-07-23 NOTE — Assessment & Plan Note (Signed)
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 - try off cozar 11/03/2014 >>> - neurontin 100 tid rx 11/30/14 > improved 01/03/2015 > changed to lyrica 04/2015 but not taking consistently as of 01/30/2016  - sinus CT 02/17/15 > neg  - FENO  04/14/17 =  14  - rechallenged with gabapentin 300 qid 07/22/2018 and if not better > refer to Dr Joya Gaskins at [facility] voice center > did not go, referred again 01/07/2019 > see eval 03/02/2019 rec speech therapy/tramadol (pt refused latter)   - flare early Sep 2021 off gabapentin and on breztri > changed back to gabapentin / symb 80 2bid > preferred breztri  - 11/21/2020 rec increase gabapentin to 300 qid to eliminate need for tramadol  - 12/28/2020 acute breakthru despite following instructions x for bed blocks > rx oxy 5 mg q 4 hr prn  Again rec gabapentin 300 qid until cough gone then taper to bid/ no more oxy until document she's following this instruction.  Each maintenance medication was reviewed in detail including most importantly the difference between maintenance and as needed and under what circumstances the prns are to be used.  Please see AVS for specific  Instructions which are unique to this visit and I personally typed out  which were reviewed in detail over the phone with the patient and a copy provided via myChart

## 2021-08-01 ENCOUNTER — Other Ambulatory Visit: Payer: Self-pay | Admitting: Internal Medicine

## 2021-08-03 ENCOUNTER — Ambulatory Visit (INDEPENDENT_AMBULATORY_CARE_PROVIDER_SITE_OTHER): Payer: Medicare Other

## 2021-08-03 DIAGNOSIS — J449 Chronic obstructive pulmonary disease, unspecified: Secondary | ICD-10-CM

## 2021-08-03 DIAGNOSIS — J9811 Atelectasis: Secondary | ICD-10-CM | POA: Diagnosis not present

## 2021-08-06 ENCOUNTER — Other Ambulatory Visit: Payer: Self-pay

## 2021-08-06 ENCOUNTER — Encounter: Payer: Self-pay | Admitting: Primary Care

## 2021-08-06 ENCOUNTER — Ambulatory Visit (INDEPENDENT_AMBULATORY_CARE_PROVIDER_SITE_OTHER): Payer: Medicare Other | Admitting: Primary Care

## 2021-08-06 VITALS — BP 120/72 | HR 89 | Temp 98.5°F | Ht 65.0 in | Wt 199.4 lb

## 2021-08-06 DIAGNOSIS — R0602 Shortness of breath: Secondary | ICD-10-CM

## 2021-08-06 DIAGNOSIS — J449 Chronic obstructive pulmonary disease, unspecified: Secondary | ICD-10-CM

## 2021-08-06 MED ORDER — PREDNISONE 10 MG PO TABS: ORAL_TABLET | ORAL | 0 refills | Status: DC

## 2021-08-06 MED ORDER — METHYLPREDNISOLONE ACETATE 80 MG/ML IJ SUSP
80.0000 mg | Freq: Once | INTRAMUSCULAR | Status: AC
  Administered 2021-08-06: 80 mg via INTRAMUSCULAR

## 2021-08-06 NOTE — Telephone Encounter (Signed)
Called pt after receiving the following email from her:    GM. Labored breathing is getting worse. It has awakened me from sleep in the middle of the night 4 days in a row and where I had to use nebulizer. This has never happened. The prednisone given me last week did not work as it did previously. This time I had to use nebulizer while taking prednisone. What do I do now? Is there a different course of action? Apparently something has to change. Thank you.  I tried asking pt about her symptoms and offered the only appt we had open today, with TP at 3:30. She refused apt and asked about her cxr results. I spoke with her gave her the following results:   Call pt:  Reviewed cxr and there does appear to be increase in markings so Iwould rec HRCT and f/u ov a few days later to complete the w/u    Pt agreed to HRCT and this was ordered. She states wants to know why MW does not want to address her aortic atherosclerosis. She seen her results in [place] and read where this was noted on her cxr. She feels that this is what is causing all of her breathing problems. Please advise thanks!

## 2021-08-06 NOTE — Progress Notes (Signed)
@Patient  ID: [name], female    DOB: [birth year], 75 y.o.   MRN: [id number]  Chief Complaint  Patient presents with   Follow-up    Mychart notes, patient feels like her breathing is labored x 4 days and wakes her up at night.      Referring provider: [name], MD  HPI: 75 year old female, former smoker. PMH significant for COPD GOLD II, HTN, type 2 DM. Patient of Dr. [name], last seen in July 2022. Maintained on [employer].   08/06/2021- Interim hx  Patient presents today for acute visit. Last 4 days she has been waking up around 3:30-5am with shortness of breath and wheezing. She will use ipratropium-albuterol nebulizer in the middle of the night with improvement. She recently completed two prednisone course. First round of steriods she reported a significant improvement in her breathing and did not require use of her nebulizer. Second round of steroids was not as effective. She is compliant with Protonix 40mg  daily and famotidine 20mg  daily. CXR 08/03/21 showed right basilar atelectasis and left basilar scarring. No acute abnormalities. Dr. [name] recommended HRCT which has been scheduled for August 25th. She does get winded with exertion as well form time to time. No other significant respiratory complaints. She brought in all her medications today and these were reconciled. She denies any palpitations, anxiety or panic attacks.  Allergies  Allergen Reactions   Other Itching    HAIR DYE    Immunization History  Administered Date(s) Administered   Fluad Quad(high Dose 65+) 09/06/2020   Influenza Split 09/29/2013, 09/30/2015   Influenza Whole 08/30/2016   Influenza, High Dose Seasonal PF 09/29/2017, 09/14/2018, 09/01/2019   Influenza,inj,Quad PF,6+ Mos 09/12/2014   Influenza-Unspecified 09/14/2018   PFIZER(Purple Top)SARS-COV-2 Vaccination 01/19/2020, 02/09/2020, 12/21/2020   Pneumococcal Conjugate-13 01/06/2020   Pneumococcal Polysaccharide-23 10/07/2013    Pneumococcal-Unspecified 10/05/2013   Tdap 06/07/2019, 06/09/2019   Zoster Recombinat (Shingrix) 07/26/2020, 10/09/2020    Past Medical History:  Diagnosis Date   COPD (chronic obstructive pulmonary disease) (HCC)    Diabetes ([place])    High cholesterol    Hypertension    Pneumonia 09/10/2017    Tobacco History: Social History   Tobacco Use  Smoking Status Former   Packs/day: 0.50   Years: 42.00   Pack years: 21.00   Types: Cigarettes   Start date: 01/30/1967   Quit date: 01/30/2006   Years since quitting: 15.5  Smokeless Tobacco Never  Tobacco Comments   intermittent cycles b/w 1/4-1/2ppd   Counseling given: Not Answered Tobacco comments: intermittent cycles b/w 1/4-1/2ppd   Outpatient Medications Prior to Visit  Medication Sig Dispense Refill   Accu-Chek FastClix Lancets MISC Inject 1 each as directed daily. Use as directed to check blood sugars daily dx: e11.22 50 each 11   albuterol (VENTOLIN HFA) 108 (90 Base) MCG/ACT inhaler Inhale 2 puffs into the lungs every 4 (four) hours as needed for wheezing or shortness of breath. 18 g 12   aspirin 81 MG tablet Take 81 mg by mouth daily.     Biotin 10000 MCG TBDP Take by mouth.     bisoprolol (ZEBETA) 5 MG tablet TAKE 1/2 TABLET(2.5 MG) BY MOUTH DAILY 45 tablet 3   Budeson-Glycopyrrol-Formoterol (BREZTRI AEROSPHERE) 160-9-4.8 MCG/ACT AERO Inhale 2 puffs into the lungs 2 (two) times daily. 10.7 g 6   CALCIUM-MAGNESIUM-ZINC PO Take 1 capsule by mouth daily.     Cholecalciferol (VITAMIN D3) 2000 UNITS TABS Take 2,000 Units by mouth daily.  famotidine (PEPCID) 20 MG tablet One after supper 90 tablet 3   gabapentin (NEURONTIN) 300 MG capsule Take 1 capsule (300 mg total) by mouth 4 (four) times daily. 120 capsule 5   glucose blood (ACCU-CHEK GUIDE) test strip 1 each by Other route daily. Use as instructed to check blood sugars 1 time per day dx: e11.22 50 each 11   montelukast (SINGULAIR) 10 MG tablet TAKE 1 TABLET(10 MG) BY MOUTH  AT BEDTIME 90 tablet 2   pantoprazole (PROTONIX) 40 MG tablet TAKE 1 TABLET BY MOUTH 30 MINS BEFORE FIRST MEAL OF THE DAY 90 tablet 2   rosuvastatin (CRESTOR) 20 MG tablet TAKE 1 TABLET(20 MG) BY MOUTH AT BEDTIME 90 tablet 1   Semaglutide, 1 MG/DOSE, (OZEMPIC, 1 MG/DOSE,) 4 MG/3ML SOPN Inject 1 mg into the skin once a week. 3 mL 3   fluticasone (FLONASE) 50 MCG/ACT nasal spray Place 1 spray into both nostrils daily.     ipratropium-albuterol (DUONEB) 0.5-2.5 (3) MG/3ML SOLN Take by nebulization.     predniSONE (DELTASONE) 10 MG tablet Take  4 each am x 2 days,   2 each am x 2 days,  1 each am x 2 days and stop (Patient not taking: Reported on 08/06/2021) 14 tablet 0   No facility-administered medications prior to visit.    Review of Systems  Review of Systems  Constitutional: Negative.   Respiratory:  Positive for shortness of breath. Negative for cough.   Psychiatric/Behavioral: Negative.      Physical Exam  BP 120/72 (BP Location: Left Arm, Patient Position: Sitting, Cuff Size: Normal)   Pulse 89   Temp 98.5 F (36.9 C) (Oral)   Ht 5\' 5"  (1.651 m)   Wt 199 lb 6.4 oz (90.4 kg)   SpO2 94%   BMI 33.18 kg/m  Physical Exam Constitutional:      Appearance: Normal appearance.  HENT:     Head: Normocephalic and atraumatic.     Mouth/Throat:     Mouth: Mucous membranes are moist.     Pharynx: Oropharynx is clear.  Cardiovascular:     Rate and Rhythm: Normal rate and regular rhythm.  Pulmonary:     Effort: Pulmonary effort is normal.     Breath sounds: Wheezing present. No rhonchi or rales.  Musculoskeletal:        General: Normal range of motion.  Skin:    General: Skin is warm and dry.  Neurological:     General: No focal deficit present.     Mental Status: She is alert and oriented to person, place, and time. Mental status is at baseline.  Psychiatric:        Mood and Affect: Mood normal.        Behavior: Behavior normal.        Thought Content: Thought content normal.         Judgment: Judgment normal.     Lab Results:  CBC    Component Value Date/Time   WBC 6.0 10/16/2020 1101   RBC 4.59 10/16/2020 1101   HGB 13.8 10/16/2020 1101   HGB 14.2 09/06/2020 1425   HCT 41.2 10/16/2020 1101   HCT 45.0 09/06/2020 1425   PLT 399.0 10/16/2020 1101   PLT 373 09/06/2020 1425   MCV 89.7 10/16/2020 1101   MCV 92 09/06/2020 1425   MCH 29.0 09/06/2020 1425   MCH 29.0 09/13/2017 0220   MCHC 33.5 10/16/2020 1101   RDW 13.9 10/16/2020 1101   RDW 13.7 09/06/2020 1425  LYMPHSABS 2.3 10/16/2020 1101   MONOABS 0.5 10/16/2020 1101   EOSABS 0.1 10/16/2020 1101   BASOSABS 0.1 10/16/2020 1101    BMET    Component Value Date/Time   NA 143 04/04/2021 1528   K 5.4 (H) 04/04/2021 1528   CL 101 04/04/2021 1528   CO2 26 04/04/2021 1528   GLUCOSE 74 04/04/2021 1528   GLUCOSE 135 (H) 09/12/2017 0416   BUN 5 (L) 04/04/2021 1528   CREATININE 0.86 04/04/2021 1528   CALCIUM 10.1 04/04/2021 1528   GFRNONAA 70 01/04/2021 1450   GFRAA 81 01/04/2021 1450    BNP No results found for: BNP  ProBNP    Component Value Date/Time   PROBNP 5.0 02/28/2014 1503    Imaging: DG Chest 2 View  Result Date: 08/05/2021 CLINICAL DATA:  COPD EXAM: CHEST - 2 VIEW COMPARISON:  08/21/2020 FINDINGS: Normal heart size, mediastinal contours, and pulmonary vascularity. Scarring at LEFT costophrenic angle unchanged. Mild accentuation of RIGHT basilar markings likely reflects atelectasis, unchanged. Atherosclerotic calcification aorta. No acute infiltrate, pleural effusion, or pneumothorax. Scattered endplate spur formation thoracic spine. IMPRESSION: RIGHT basilar atelectasis and LEFT basilar scarring. No acute abnormalities. Aortic Atherosclerosis (ICD10-I70.0). Electronically Signed   By: Lavonia Dana M.D.   On: 08/05/2021 11:23     Assessment & Plan:   COPD GOLD II  - Patient has been experiencing nocturnal symptoms including shortness of breath and wheezing x 4 days. She is compliant  with [employer]. She is waking up in the middle of the night and having to use her ipratropium-albuterol nebulizer. Her symptoms typically respond well to prednisone. She has coarse wheezing throughout lung fields on exam today. She was given Depo-medrol 80mg  IM x 1 today in office. Recommend adding spacer to HFA inhaler. CXR on 08/03/21 showed right basilar atelectasis and left basilar scarring. She is scheduled for HRCT on August 25th. I recommend we check echocardiogram d/t dyspnea symptoms, she had grade 1 DD dysfunction on echo in 2015 and aortic atherosclerosis in chest imaging. May need further cardiac evaluation.   40 mins spent on case; > 50% face to face with patient    Martyn Ehrich, NP 08/06/2021

## 2021-08-06 NOTE — Telephone Encounter (Signed)
Spoke with the pt  Appt scheduled with Beth at 4:30 today

## 2021-08-06 NOTE — Patient Instructions (Addendum)
Recommendations: - Continue Breztri two puff morning and evening  - Continue Protonix 40mg  in morning and Famotidine 20mg  at bedtime  - Stop eating 2 hours before bed and follow GERD diet  - Add spacer to [employer]   Orders: Depo 80mg  IM x 1 today Echocardiogram re: dyspnea   Rx: 40mg  x 2 days; 20mg  x 2 days; 10mg  x 2 days (start taper tomorrow)   Follow-up: After CT scan with Dr. [name]    Food Choices for Gastroesophageal Reflux Disease, Adult When you have gastroesophageal reflux disease (GERD), the foods you eat and your eating habits are very important. Choosing the right foods can help ease your discomfort. Think about working with a food expert (dietitian) to help you make good choices. What are tips for following this plan? Reading food labels Look for foods that are low in saturated fat. Foods that may help with your symptoms include: Foods that have less than 5% of daily value (DV) of fat. Foods that have 0 grams of trans fat. Cooking Do not fry your food. Cook your food by baking, steaming, grilling, or broiling. These are all methods that do not need a lot of fat for cooking. To add flavor, try to use herbs that are low in spice and acidity. Meal planning  Choose healthy foods that are low in fat, such as: Fruits and vegetables. Whole grains. Low-fat dairy products. Lean meats, fish, and poultry. Eat small meals often instead of eating 3 large meals each day. Eat your meals slowly in a place where you are relaxed. Avoid bending over or lying down until 2-3 hours after eating. Limit high-fat foods such as fatty meats or fried foods. Limit your intake of fatty foods, such as oils, butter, and shortening. Avoid the following as told by your doctor: Foods that cause symptoms. These may be different for different people. Keep a food diary to keep track of foods that cause symptoms. Alcohol. Drinking a lot of liquid with meals. Eating meals during the 2-3 hours before  bed.  Lifestyle Stay at a healthy weight. Ask your doctor what weight is healthy for you. If you need to lose weight, work with your doctor to do so safely. Exercise for at least 30 minutes on 5 or more days each week, or as told by your doctor. Wear loose-fitting clothes. Do not smoke or use any products that contain nicotine or tobacco. If you need help quitting, ask your doctor. Sleep with the head of your bed higher than your feet. Use a wedge under the mattress or blocks under the bed frame to raise the head of the bed. Chew sugar-free gum after meals. What foods should eat?  Eat a healthy, well-balanced diet of fruits, vegetables, whole grains, low-fatdairy products, lean meats, fish, and poultry. Each person is different. Foods that may cause symptoms in one person may not cause any symptoms inanother person. Work with your doctor to find foods that are safe for you. The items listed above may not be a complete list of what you can eat and drink. Contact a food expert for more options. What foods should I avoid? Limiting some of these foods may help in managing the symptoms of GERD. Everyone is different. Talk with a food expert or your doctor to help you findthe exact foods to avoid, if any. Fruits Any fruits prepared with added fat. Any fruits that cause symptoms. For some people, this may include citrus fruits, such as oranges, grapefruit, pineapple,and lemons. Vegetables Deep-fried  vegetables. [place] fries. Any vegetables prepared with added fat. Any vegetables that cause symptoms. For some people, this may include tomatoesand tomato products, chili peppers, onions and garlic, and horseradish. Grains Pastries or quick breads with added fat. Meats and other proteins High-fat meats, such as fatty beef or pork, hot dogs, ribs, ham, sausage, salami, and bacon. Fried meat or protein, including fried fish and friedchicken. Nuts and nut butters, in large amounts. Dairy Whole milk and  chocolate milk. Sour cream. Cream. Ice cream. Cream cheese.Milkshakes. Fats and oils Butter. Margarine. Shortening. Ghee. Beverages Coffee and tea, with or without caffeine. Carbonated beverages. Sodas. Energy drinks. Fruit juice made with acidic fruits, such as orange or grapefruit.Tomato juice. Alcoholic drinks. Sweets and desserts Chocolate and cocoa. Donuts. Seasonings and condiments Pepper. Peppermint and spearmint. Added salt. Any condiments, herbs, or seasonings that cause symptoms. For some people, this may include curry, hotsauce, or vinegar-based salad dressings. The items listed above may not be a complete list of what you should not eat and drink. Contact a food expert for more options. Questions to ask your doctor Diet and lifestyle changes are often the first steps that are taken to manage symptoms of GERD. If diet and lifestyle changes do not help, talk with yourdoctor about taking medicines. Where to find more information International Foundation for Gastrointestinal Disorders: aboutgerd.org Summary When you have GERD, food and lifestyle choices are very important in easing your symptoms. Eat small meals often instead of 3 large meals a day. Eat your meals slowly and in a place where you are relaxed. Avoid bending over or lying down until 2-3 hours after eating. Limit high-fat foods such as fatty meats or fried foods. This information is not intended to replace advice given to you by your health care provider. Make sure you discuss any questions you have with your healthcare provider. Document Revised: 06/26/2020 Document Reviewed: 06/26/2020 Elsevier Patient Education  [place].

## 2021-08-06 NOTE — Assessment & Plan Note (Addendum)
-   Patient has been experiencing nocturnal symptoms including shortness of breath and wheezing x 4 days. She is compliant with [employer]. She is waking up in the middle of the night and having to use her ipratropium-albuterol nebulizer. Her symptoms typically respond well to prednisone. She has coarse wheezing throughout lung fields on exam today. She was given Depo-medrol 80mg  IM x 1 today in office. Recommend adding spacer to HFA inhaler. CXR on 08/03/21 showed right basilar atelectasis and left basilar scarring. She is scheduled for HRCT on August 25th. I recommend we check echocardiogram d/t dyspnea symptoms, she had grade 1 DD dysfunction on echo in 2015 and aortic atherosclerosis in chest imaging. May need further cardiac evaluation.

## 2021-08-07 ENCOUNTER — Telehealth: Payer: Self-pay

## 2021-08-07 ENCOUNTER — Ambulatory Visit (HOSPITAL_COMMUNITY)
Admission: RE | Admit: 2021-08-07 | Discharge: 2021-08-07 | Disposition: A | Discharge status: Home / Self Care | Payer: Medicare Other | Source: Ambulatory Visit | Attending: Primary Care | Admitting: Primary Care

## 2021-08-07 DIAGNOSIS — I10 Essential (primary) hypertension: Secondary | ICD-10-CM | POA: Diagnosis not present

## 2021-08-07 DIAGNOSIS — R0602 Shortness of breath: Secondary | ICD-10-CM | POA: Diagnosis not present

## 2021-08-07 DIAGNOSIS — E119 Type 2 diabetes mellitus without complications: Secondary | ICD-10-CM | POA: Insufficient documentation

## 2021-08-07 DIAGNOSIS — J449 Chronic obstructive pulmonary disease, unspecified: Secondary | ICD-10-CM | POA: Insufficient documentation

## 2021-08-07 DIAGNOSIS — I080 Rheumatic disorders of both mitral and aortic valves: Secondary | ICD-10-CM | POA: Diagnosis not present

## 2021-08-07 LAB — ECHOCARDIOGRAM COMPLETE
Area-P 1/2: 5.42 cm2
S' Lateral: 3 cm

## 2021-08-07 NOTE — Progress Notes (Signed)
Please let patient know her echocardiogram was essentially normal. 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 Not uncommon at her age, could cause shortness of breath. Discuss with PCP or Dr. [name].

## 2021-08-07 NOTE — Chronic Care Management (AMB) (Signed)
Chronic Care Management Pharmacy Assistant   Name: [name]  MRN: [id number] DOB: 1946/04/14   Reason for Encounter: Disease State/ Hypertension  Recent office visits:  None  Recent consult visits:  07-23-2021 Tanda Rockers, MD (Pulmonary). DG Chest placed. STOP Oxycodone.  08-06-2021 Martyn Ehrich, NP (Pulmonary). Echogram order placed. Depo medrol 80 mg injection given. Prednisone reordered.  Hospital visits:  None in previous 6 months  Medications: Outpatient Encounter Medications as of 08/07/2021  Medication Sig   Accu-Chek FastClix Lancets MISC Inject 1 each as directed daily. Use as directed to check blood sugars daily dx: e11.22   albuterol (VENTOLIN HFA) 108 (90 Base) MCG/ACT inhaler Inhale 2 puffs into the lungs every 4 (four) hours as needed for wheezing or shortness of breath.   aspirin 81 MG tablet Take 81 mg by mouth daily.   Biotin 10000 MCG TBDP Take by mouth.   bisoprolol (ZEBETA) 5 MG tablet TAKE 1/2 TABLET(2.5 MG) BY MOUTH DAILY   Budeson-Glycopyrrol-Formoterol (BREZTRI AEROSPHERE) 160-9-4.8 MCG/ACT AERO Inhale 2 puffs into the lungs 2 (two) times daily.   CALCIUM-MAGNESIUM-ZINC PO Take 1 capsule by mouth daily.   Cholecalciferol (VITAMIN D3) 2000 UNITS TABS Take 2,000 Units by mouth daily.   famotidine (PEPCID) 20 MG tablet One after supper   fluticasone (FLONASE) 50 MCG/ACT nasal spray Place 1 spray into both nostrils daily.   gabapentin (NEURONTIN) 300 MG capsule Take 1 capsule (300 mg total) by mouth 4 (four) times daily.   glucose blood (ACCU-CHEK GUIDE) test strip 1 each by Other route daily. Use as instructed to check blood sugars 1 time per day dx: e11.22   ipratropium-albuterol (DUONEB) 0.5-2.5 (3) MG/3ML SOLN Take by nebulization.   montelukast (SINGULAIR) 10 MG tablet TAKE 1 TABLET(10 MG) BY MOUTH AT BEDTIME   pantoprazole (PROTONIX) 40 MG tablet TAKE 1 TABLET BY MOUTH 30 MINS BEFORE FIRST MEAL OF THE DAY   predniSONE (DELTASONE) 10  MG tablet Take 4 tabs po daily x 2 days; then 2 tabs for 2 days; then 1 tab for 2 days   rosuvastatin (CRESTOR) 20 MG tablet TAKE 1 TABLET(20 MG) BY MOUTH AT BEDTIME   Semaglutide, 1 MG/DOSE, (OZEMPIC, 1 MG/DOSE,) 4 MG/3ML SOPN Inject 1 mg into the skin once a week.   No facility-administered encounter medications on file as of 08/07/2021.   Reviewed chart prior to disease state call. Spoke with patient regarding BP  Recent Office Vitals: BP Readings from Last 3 Encounters:  08/06/21 120/72  04/18/21 130/72  04/12/21 122/68   Pulse Readings from Last 3 Encounters:  08/06/21 89  04/18/21 72  04/12/21 68    Wt Readings from Last 3 Encounters:  08/06/21 199 lb 6.4 oz (90.4 kg)  05/01/21 197 lb (89.4 kg)  04/18/21 197 lb 3.2 oz (89.4 kg)     Kidney Function Lab Results  Component Value Date/Time   CREATININE 0.86 04/04/2021 03:28 PM   CREATININE 0.83 01/04/2021 02:50 PM   GFRNONAA 70 01/04/2021 02:50 PM   GFRAA 81 01/04/2021 02:50 PM    BMP Latest Ref Rng & Units 04/04/2021 01/04/2021 09/06/2020  Glucose 65 - 99 mg/dL 74 96 72  BUN 8 - 27 mg/dL 5(L) 8 5(L)  Creatinine 0.57 - 1.00 mg/dL 0.86 0.83 0.76  BUN/Creat Ratio 12 - 28 6(L) 10(L) 7(L)  Sodium 134 - 144 mmol/L 143 142 141  Potassium 3.5 - 5.2 mmol/L 5.4(H) 5.1 4.4  Chloride 96 - 106 mmol/L 101 101 99  CO2 20 - 29 mmol/L 26 27 28   Calcium 8.7 - 10.3 mg/dL 10.1 9.7 9.6    Current antihypertensive regimen:  Bisoprolol 5 mg - taking 1/2 tablet by mouth daily  How often are you checking your Blood Pressure? infrequently but patient saw pulmonologist on 08-06-2021 and blood pressure was taken  Current home BP readings: 120/72 at appointment  What recent interventions/DTPs have been made by any provider to improve Blood Pressure control since last CPP Visit:  Educated on BP goals and benefits of medications for prevention of heart attack, stroke and kidney damage Daily salt intake goal < 2300 mg  Any recent hospitalizations  or ED visits since last visit with CPP? No  What diet changes have been made to improve Blood Pressure Control?  Patient states she has been limiting her salt intake  What exercise is being done to improve your Blood Pressure Control?  Patient states she has be short of breath lately and using nebulizer twice daily preventing her from exercising. Patient states Dr. [name] is aware of her issue.   Adherence Review: Is the patient currently on ACE/ARB medication? No Does the patient have >5 day gap between last estimated fill dates? No  NOTES: Patient states she was on the way to get a echogram done because her Xray showed increased markings. Patient is really concerned about her breathing and hopes the echogram will rule out everything.  Care Gaps: Dose 4 covid booster overdue RAF= 1.318% Medicare well visit 09-12-2021  Star Rating Drugs: Ozempic 1 mg- Patient assistance Rosuvastatin 20 mg- Last filled 07-22-2021 90 DS [place] Clinical Pharmacist Assistant [phone]

## 2021-08-08 ENCOUNTER — Ambulatory Visit (HOSPITAL_COMMUNITY): Payer: Medicare Other

## 2021-08-08 NOTE — Progress Notes (Signed)
Pt has already been notified of results

## 2021-08-22 ENCOUNTER — Other Ambulatory Visit: Payer: Self-pay | Admitting: Nurse Practitioner

## 2021-08-23 ENCOUNTER — Other Ambulatory Visit: Payer: Self-pay

## 2021-08-23 ENCOUNTER — Encounter: Payer: Self-pay | Admitting: Nurse Practitioner

## 2021-08-23 ENCOUNTER — Ambulatory Visit
Admission: RE | Admit: 2021-08-23 | Discharge: 2021-08-23 | Disposition: A | Discharge status: Home / Self Care | Payer: Medicare Other | Source: Ambulatory Visit | Attending: Internal Medicine | Admitting: Internal Medicine

## 2021-08-23 ENCOUNTER — Telehealth: Payer: Self-pay | Admitting: Internal Medicine

## 2021-08-23 DIAGNOSIS — I700 Atherosclerosis of aorta: Secondary | ICD-10-CM | POA: Diagnosis not present

## 2021-08-23 DIAGNOSIS — R911 Solitary pulmonary nodule: Secondary | ICD-10-CM | POA: Diagnosis not present

## 2021-08-23 DIAGNOSIS — J439 Emphysema, unspecified: Secondary | ICD-10-CM | POA: Diagnosis not present

## 2021-08-23 DIAGNOSIS — R0602 Shortness of breath: Secondary | ICD-10-CM

## 2021-08-23 MED ORDER — IPRATROPIUM-ALBUTEROL 0.5-2.5 (3) MG/3ML IN SOLN: RESPIRATORY_TRACT | 1 refills | Status: DC

## 2021-08-23 NOTE — Telephone Encounter (Signed)
Call report from [facility]:    IMPRESSION: 1. No findings to suggest interstitial lung disease. 2. However, there is a 1.6 x 1.6 x 1.8 cm aggressive appearing nodule in the posterior aspect of the right upper lobe which is highly concerning for primary bronchogenic neoplasm. No definite mediastinal or hilar lymphadenopathy confidently identified on today's noncontrast CT examination. Further evaluation with PET-CT is recommended in the near future to better evaluate this finding. 3. Diffuse bronchial wall thickening with mild centrilobular and paraseptal emphysema; imaging findings suggestive of underlying COPD. 4. Aortic atherosclerosis, in addition to left main and 3 vessel coronary artery disease. Please note that although the presence of coronary artery calcium documents the presence of coronary artery disease, the severity of this disease and any potential stenosis cannot be assessed on this non-gated CT examination. Assessment for potential risk factor modification, dietary therapy or pharmacologic therapy may be warranted, if clinically indicated.

## 2021-08-24 ENCOUNTER — Telehealth: Payer: Self-pay | Admitting: Internal Medicine

## 2021-08-24 ENCOUNTER — Encounter: Payer: Self-pay | Admitting: Internal Medicine

## 2021-08-24 DIAGNOSIS — R911 Solitary pulmonary nodule: Secondary | ICD-10-CM | POA: Insufficient documentation

## 2021-08-24 MED ORDER — PREDNISONE 10 MG PO TABS: ORAL_TABLET | ORAL | 0 refills | Status: AC

## 2021-08-24 MED ORDER — TRAMADOL HCL 50 MG PO TABS: 50.0000 mg | ORAL_TABLET | Freq: Four times a day (QID) | ORAL | 0 refills | Status: DC | PRN

## 2021-08-24 NOTE — Telephone Encounter (Signed)
Spoke with the pt and notified her of the PET date/time  She states also ready aware of this   She sent the following email today:   [place], Hal Neer to [facility] (supporting Tanda Rockers, MD)     11:51 AM Coughing with gagging and heaving for 2 weeks. Taking 4 Gabapentin as you prescribed but it is not stopping cough. Just makes me sleepy. Requesting oxycodone, Tramadol OR hydrocodone. Please. Thank you  She states that her cough has been worse 2 wks until she is gagging and dry heaving. She states she is wheezing "all day"- gabapentin taking 4 x per day and this is not helping. Still on Breztri and using neb occ. She denies any f/c/s, aches. There are no openings with anyone today or Monday and she would like [place] to send her something in- prefers meds listed above in bold. I made her aware MW not available today. She is asking for something today so forwarding to DOD. Thanks   Allergies  Allergen Reactions   Other Itching    HAIR DYE

## 2021-08-24 NOTE — Progress Notes (Signed)
Called pt and there was no answer-LMTCB °

## 2021-08-24 NOTE — Telephone Encounter (Signed)
Call made to patient, confirmed DOB. Made aware of MW recommendations. Voiced understanding.   Nothing further needed at this time.

## 2021-08-24 NOTE — Telephone Encounter (Signed)
I called and spoke with patient regarding Dr. Lamonte Sakai recs. Patient stated that Dr. [name] sends it in for her all the time and I explained that Dr.byrum is not her provider so hes not comfortable sending in narcotics but I will route to Dr. [name] and he can decide from there. Patient then hung up on me. Will route to Dr. [name]  Dr. [name], please advise. Thanks!

## 2021-08-24 NOTE — Telephone Encounter (Signed)
Prednisone 10 mg take  4 each am x 2 days,   2 each am x 2 days,  1 each am x 2 days and stop   Renewed tramadol for now, call 8/29 if not doing a lot better

## 2021-08-24 NOTE — Telephone Encounter (Signed)
ATC, left VM for callback. 

## 2021-08-24 NOTE — Telephone Encounter (Signed)
Call made to patient, confirmed DOB. Made aware of results per MW:     Voiced understanding. Order for PET placed. Patient has an appt 09/04/21. Requesting to have PET by that appt to discuss with Dr [name] at that time.   Nothing further needed at this time.

## 2021-08-24 NOTE — Telephone Encounter (Signed)
MW please advise. Thanks  Coughing with gagging and heaving for 2 weeks. Taking 4 Gabapentin as you prescribed but it is not stopping cough. Just makes me sleepy. Requesting oxycodone, Tramadol OR hydrocodone. Please. Thank you.

## 2021-08-24 NOTE — Telephone Encounter (Signed)
Aware - see result note same date

## 2021-08-24 NOTE — Telephone Encounter (Signed)
I have the PET scan scheduled for 09/11/21 arrive at 7:30am at [facility] long no eat or drink after midnight if you will tell her when you call her back. Thanks

## 2021-08-24 NOTE — Telephone Encounter (Signed)
I'm not comfortable sending in narcotics for her. We can forward for Dr [name] to review to see if he will send, or provide an alternative.

## 2021-08-24 NOTE — Telephone Encounter (Signed)
Pt returning call & can be reached at [phone].

## 2021-08-28 ENCOUNTER — Ambulatory Visit: Payer: Medicare Other | Admitting: Internal Medicine

## 2021-08-30 ENCOUNTER — Other Ambulatory Visit: Payer: Self-pay | Admitting: Internal Medicine

## 2021-09-04 ENCOUNTER — Ambulatory Visit: Payer: Medicare Other | Admitting: Internal Medicine

## 2021-09-11 ENCOUNTER — Ambulatory Visit (HOSPITAL_COMMUNITY)
Admission: RE | Admit: 2021-09-11 | Discharge: 2021-09-11 | Disposition: A | Discharge status: Home / Self Care | Payer: Medicare Other | Source: Ambulatory Visit | Attending: Internal Medicine | Admitting: Internal Medicine

## 2021-09-11 ENCOUNTER — Other Ambulatory Visit: Payer: Self-pay

## 2021-09-11 DIAGNOSIS — I700 Atherosclerosis of aorta: Secondary | ICD-10-CM | POA: Insufficient documentation

## 2021-09-11 DIAGNOSIS — K429 Umbilical hernia without obstruction or gangrene: Secondary | ICD-10-CM | POA: Insufficient documentation

## 2021-09-11 DIAGNOSIS — C799 Secondary malignant neoplasm of unspecified site: Secondary | ICD-10-CM | POA: Diagnosis not present

## 2021-09-11 DIAGNOSIS — I2510 Atherosclerotic heart disease of native coronary artery without angina pectoris: Secondary | ICD-10-CM | POA: Diagnosis not present

## 2021-09-11 DIAGNOSIS — R911 Solitary pulmonary nodule: Secondary | ICD-10-CM | POA: Diagnosis not present

## 2021-09-11 DIAGNOSIS — J439 Emphysema, unspecified: Secondary | ICD-10-CM | POA: Insufficient documentation

## 2021-09-11 DIAGNOSIS — M461 Sacroiliitis, not elsewhere classified: Secondary | ICD-10-CM | POA: Diagnosis not present

## 2021-09-11 DIAGNOSIS — K439 Ventral hernia without obstruction or gangrene: Secondary | ICD-10-CM | POA: Diagnosis not present

## 2021-09-11 LAB — GLUCOSE, CAPILLARY: Glucose-Capillary: 134 mg/dL — ABNORMAL HIGH (ref 70–99)

## 2021-09-11 MED ORDER — FLUDEOXYGLUCOSE F - 18 (FDG) INJECTION
10.3000 | Freq: Once | INTRAVENOUS | Status: AC
  Administered 2021-09-11: 9.9 via INTRAVENOUS

## 2021-09-12 ENCOUNTER — Ambulatory Visit (INDEPENDENT_AMBULATORY_CARE_PROVIDER_SITE_OTHER): Payer: Medicare Other

## 2021-09-12 ENCOUNTER — Telehealth: Payer: Self-pay

## 2021-09-12 ENCOUNTER — Encounter: Payer: Self-pay | Admitting: Internal Medicine

## 2021-09-12 ENCOUNTER — Ambulatory Visit: Payer: Medicare Other | Admitting: Internal Medicine

## 2021-09-12 VITALS — BP 120/62 | HR 88 | Temp 98.7°F | Ht 64.8 in | Wt 198.4 lb

## 2021-09-12 DIAGNOSIS — E1165 Type 2 diabetes mellitus with hyperglycemia: Secondary | ICD-10-CM

## 2021-09-12 DIAGNOSIS — Z23 Encounter for immunization: Secondary | ICD-10-CM | POA: Diagnosis not present

## 2021-09-12 DIAGNOSIS — Z79899 Other long term (current) drug therapy: Secondary | ICD-10-CM

## 2021-09-12 DIAGNOSIS — Z0000 Encounter for general adult medical examination without abnormal findings: Secondary | ICD-10-CM | POA: Diagnosis not present

## 2021-09-12 DIAGNOSIS — I119 Hypertensive heart disease without heart failure: Secondary | ICD-10-CM

## 2021-09-12 DIAGNOSIS — M791 Myalgia, unspecified site: Secondary | ICD-10-CM

## 2021-09-12 DIAGNOSIS — I700 Atherosclerosis of aorta: Secondary | ICD-10-CM

## 2021-09-12 DIAGNOSIS — I10 Essential (primary) hypertension: Secondary | ICD-10-CM

## 2021-09-12 DIAGNOSIS — R911 Solitary pulmonary nodule: Secondary | ICD-10-CM

## 2021-09-12 DIAGNOSIS — R0602 Shortness of breath: Secondary | ICD-10-CM

## 2021-09-12 DIAGNOSIS — I2510 Atherosclerotic heart disease of native coronary artery without angina pectoris: Secondary | ICD-10-CM

## 2021-09-12 LAB — POCT URINALYSIS DIPSTICK
Bilirubin, UA: NEGATIVE
Blood, UA: NEGATIVE
Glucose, UA: NEGATIVE
Ketones, UA: NEGATIVE
Leukocytes, UA: NEGATIVE
Nitrite, UA: NEGATIVE
Protein, UA: NEGATIVE
Spec Grav, UA: <=1.005 — AB (ref 1.010–1.025)
Urobilinogen, UA: 0.2 E.U./dL
pH, UA: 6 (ref 5.0–8.0)

## 2021-09-12 NOTE — Addendum Note (Signed)
Addended by: Glenna Durand E on: 09/12/2021 04:03 PM   Modules accepted: Orders

## 2021-09-12 NOTE — Progress Notes (Signed)
I,YAMILKA J Llittleton,acting as a Education administrator for [name], MD.,have documented all relevant documentation on the behalf of [name], MD,as directed by  [name], MD while in the presence of [name], MD.  This visit occurred during the SARS-CoV-2 public health emergency.  Safety protocols were in place, including screening questions prior to the visit, additional usage of staff PPE, and extensive cleaning of exam room while observing appropriate contact time as indicated for disinfecting solutions.  Subjective:     Patient ID: [name] , female    DOB: 04-09-1946 , 75 y.o.   MRN: [id number]   Chief Complaint  Patient presents with   Hypertension   Diabetes    HPI  Pt is here today for diabetes and bp f/u.   Diabetes She presents for her follow-up diabetic visit. She has type 2 diabetes mellitus. Her disease course has been stable. There are no hypoglycemic associated symptoms. Pertinent negatives for hypoglycemia include no dizziness or headaches. Pertinent negatives for diabetes include no blurred vision, no chest pain, no fatigue, no polydipsia, no polyphagia and no polyuria. There are no hypoglycemic complications. Risk factors for coronary artery disease include diabetes mellitus, dyslipidemia, hypertension, obesity, sedentary lifestyle and post-menopausal. She has not had a previous visit with a dietitian. She participates in exercise three times a week. Her breakfast blood glucose is taken between 8-9 am. Her breakfast blood glucose range is generally 110-130 mg/dl. Eye exam is current.  Hypertension This is a chronic problem. The current episode started more than 1 year ago. The problem has been gradually improving since onset. The problem is controlled. Associated symptoms include shortness of breath. Pertinent negatives include no blurred vision, chest pain, headaches or palpitations. The current treatment provides moderate improvement. Compliance  problems include exercise.     Past Medical History:  Diagnosis Date   COPD (chronic obstructive pulmonary disease) (HCC)    Diabetes ([place])    High cholesterol    Hypertension    Pneumonia 09/10/2017     Family History  Problem Relation Age of Onset   Heart murmur Mother    Heart attack Father    Diabetes Father    Diabetes Brother      Current Outpatient Medications:    Accu-Chek FastClix Lancets MISC, Inject 1 each as directed daily. Use as directed to check blood sugars daily dx: e11.22, Disp: 50 each, Rfl: 11   albuterol (VENTOLIN HFA) 108 (90 Base) MCG/ACT inhaler, Inhale 2 puffs into the lungs every 4 (four) hours as needed for wheezing or shortness of breath., Disp: 18 g, Rfl: 12   aspirin 81 MG tablet, Take 81 mg by mouth daily., Disp: , Rfl:    Biotin 10000 MCG TBDP, Take by mouth., Disp: , Rfl:    bisoprolol (ZEBETA) 5 MG tablet, TAKE 1/2 TABLET(2.5 MG) BY MOUTH DAILY, Disp: 45 tablet, Rfl: 3   Budeson-Glycopyrrol-Formoterol (BREZTRI AEROSPHERE) 160-9-4.8 MCG/ACT AERO, Inhale 2 puffs into the lungs 2 (two) times daily., Disp: 10.7 g, Rfl: 6   CALCIUM-MAGNESIUM-ZINC PO, Take 1 capsule by mouth daily., Disp: , Rfl:    Cholecalciferol (VITAMIN D3) 2000 UNITS TABS, Take 2,000 Units by mouth daily., Disp: , Rfl:    famotidine (PEPCID) 20 MG tablet, One after supper, Disp: 90 tablet, Rfl: 3   gabapentin (NEURONTIN) 300 MG capsule, Take 1 capsule (300 mg total) by mouth 4 (four) times daily., Disp: 120 capsule, Rfl: 5   glucose blood (ACCU-CHEK GUIDE) test strip, 1  each by Other route daily. Use as instructed to check blood sugars 1 time per day dx: e11.22, Disp: 50 each, Rfl: 11   ipratropium-albuterol (DUONEB) 0.5-2.5 (3) MG/3ML SOLN, USE 3 ML VIA NEBULIZER EVERY 6 HOURS AS NEEDED, Disp: 180 mL, Rfl: 1   montelukast (SINGULAIR) 10 MG tablet, TAKE 1 TABLET(10 MG) BY MOUTH AT BEDTIME, Disp: 90 tablet, Rfl: 2   pantoprazole (PROTONIX) 40 MG tablet, TAKE 1 TABLET BY MOUTH 30 MINS  BEFORE FIRST MEAL OF THE DAY, Disp: 90 tablet, Rfl: 2   predniSONE (DELTASONE) 10 MG tablet, Take  4 each am x 2 days,   2 each am x 2 days,  1 each am x 2 days and stop, Disp: 14 tablet, Rfl: 5   rosuvastatin (CRESTOR) 20 MG tablet, TAKE 1 TABLET(20 MG) BY MOUTH AT BEDTIME, Disp: 90 tablet, Rfl: 1   Semaglutide, 1 MG/DOSE, (OZEMPIC, 1 MG/DOSE,) 4 MG/3ML SOPN, Inject 1 mg into the skin once a week., Disp: 3 mL, Rfl: 3   traMADol (ULTRAM) 50 MG tablet, Take 1 tablet (50 mg total) by mouth every 6 (six) hours as needed., Disp: 40 tablet, Rfl: 0   Allergies  Allergen Reactions   Other Itching    HAIR DYE     Review of Systems  Constitutional: Negative.  Negative for fatigue.  Eyes:  Negative for blurred vision.  Respiratory:  Positive for shortness of breath.        She c/o sob. Occurs w/ exertion. Currently treated by [facility] for COPD. She has been evaluated by Cards as well for cardiac risk assessment. Denies associated cp, palpitations, dizziness.   Cardiovascular: Negative.  Negative for chest pain and palpitations.  Gastrointestinal: Negative.   Endocrine: Negative for polydipsia, polyphagia and polyuria.  Musculoskeletal:  Positive for myalgias.  Neurological: Negative.  Negative for dizziness and headaches.  Psychiatric/Behavioral: Negative.      Today's Vitals   09/12/21 1416  BP: 120/62  Pulse: 88  Temp: 98.7 F (37.1 C)  Weight: 198 lb 6.6 oz (90 kg)  Height: 5' 4.8" (1.646 m)   Body mass index is 33.22 kg/m.   Objective:  Physical Exam Vitals and nursing note reviewed.  Constitutional:      Appearance: Normal appearance.  HENT:     Head: Normocephalic and atraumatic.     Nose:     Comments: Masked     Mouth/Throat:     Comments: Masked  Eyes:     Extraocular Movements: Extraocular movements intact.  Cardiovascular:     Rate and Rhythm: Normal rate and regular rhythm.     Heart sounds: Normal heart sounds.  Pulmonary:     Effort: Pulmonary effort is normal.      Breath sounds: Wheezing present.  Musculoskeletal:     Cervical back: Normal range of motion.  Skin:    General: Skin is warm.  Neurological:     General: No focal deficit present.     Mental Status: She is alert.  Psychiatric:        Mood and Affect: Mood normal.        Behavior: Behavior normal.        Assessment And Plan:     1. Hypertensive heart disease without heart failure Comments: Chronic, well controlled.  She is encouraged to follow low sodium diet. I will check renal function at her next visit.   2. Atherosclerosis 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 Comments: Encouraged to follow heart  healthy lifestyle. Has been eval by Cardiology.   3. Uncontrolled type 2 diabetes mellitus with hyperglycemia ([place]) Comments: I will check labs as listed below - a1c, BMP. Importance of dietary and medication compliance was d/w patient.  4. Shortness of breath Comments: Cardiology notes reviewed. However, I will refer her back to Cards due to persistent sx. Pt advised she may have cardiac cath.   5. Atherosclerosis of aorta ([place]) Comments: Chronic, she is encouraged to follow heart healthy diet.   6. Solitary pulmonary nodule on lung CT Comments: I appreciate Pulmonary input. She wants to have it removed, pt advised this is up to Cardiothoracic surgeon.  7. Myalgia Comments: I will check labs as listed below. She is encouraged to staying well hydrated. May benefit from CoQ10/Mg supplementation.  - CK, total - Magnesium  8. Polypharmacy - [facility]   The [facility] process and benefits of testing was discussed with the patient. After the discussion, the patient made an informed consent to testing and the sample was collected and mailed to the external lab.  Patient was given opportunity to ask questions. Patient verbalized understanding of the plan and was able to repeat key elements of the plan. All questions  were answered to their satisfaction.   I, [name], MD, have reviewed all documentation for this visit. The documentation on 09/12/21 for the exam, diagnosis, procedures, and orders are all accurate and complete.   IF YOU HAVE BEEN REFERRED TO A SPECIALIST, IT MAY TAKE 1-2 WEEKS TO SCHEDULE/PROCESS THE REFERRAL. IF YOU HAVE NOT HEARD FROM US/SPECIALIST IN TWO WEEKS, PLEASE GIVE [place] A CALL AT [phone] X [phone].   THE PATIENT IS ENCOURAGED TO PRACTICE SOCIAL DISTANCING DUE TO THE COVID-19 PANDEMIC.

## 2021-09-12 NOTE — Patient Instructions (Signed)
[name] , Thank you for taking time to come for your Medicare Wellness Visit. I appreciate your ongoing commitment to your health goals. Please review the following plan we discussed and let me know if I can assist you in the future.   Screening recommendations/referrals: Colonoscopy: completed 04/29/2013 Mammogram: completed 02/13/2021 Bone Density: completed 03/02/2015 Recommended yearly ophthalmology/optometry visit for glaucoma screening and checkup Recommended yearly dental visit for hygiene and checkup  Vaccinations: Influenza vaccine: today Pneumococcal vaccine: completed 01/06/2020 Tdap vaccine: completed 06/09/2019, due 06/08/2029 Shingles vaccine: completed   Covid-19: 12/21/2020, 02/09/2020, 01/19/2020  Advanced directives: Advance directive discussed with you today. Even though you declined this today please call our office should you change your mind and we can give you the proper paperwork for you to fill out.  Conditions/risks identified: none  Next appointment: Follow up in one year for your annual wellness visit    Preventive Care 65 Years and Older, Female Preventive care refers to lifestyle choices and visits with your health care provider that can promote health and wellness. What does preventive care include? A yearly physical exam. This is also called an annual well check. Dental exams once or twice a year. Routine eye exams. Ask your health care provider how often you should have your eyes checked. Personal lifestyle choices, including: Daily care of your teeth and gums. Regular physical activity. Eating a healthy diet. Avoiding tobacco and drug use. Limiting alcohol use. Practicing safe sex. Taking low-dose aspirin every day. Taking vitamin and mineral supplements as recommended by your health care provider. What happens during an annual well check? The services and screenings done by your health care provider during your annual well check will depend on your  age, overall health, lifestyle risk factors, and family history of disease. Counseling  Your health care provider may ask you questions about your: Alcohol use. Tobacco use. Drug use. Emotional well-being. Home and relationship well-being. Sexual activity. Eating habits. History of falls. Memory and ability to understand (cognition). Work and work Statistician. Reproductive health. Screening  You may have the following tests or measurements: Height, weight, and BMI. Blood pressure. Lipid and cholesterol levels. These may be checked every 5 years, or more frequently if you are over 69 years old. Skin check. Lung cancer screening. You may have this screening every year starting at age 37 if you have a 30-pack-year history of smoking and currently smoke or have quit within the past 15 years. Fecal occult blood test (FOBT) of the stool. You may have this test every year starting at age 74. Flexible sigmoidoscopy or colonoscopy. You may have a sigmoidoscopy every 5 years or a colonoscopy every 10 years starting at age 19. Hepatitis C blood test. Hepatitis B blood test. Sexually transmitted disease (STD) testing. Diabetes screening. This is done by checking your blood sugar (glucose) after you have not eaten for a while (fasting). You may have this done every 1-3 years. Bone density scan. This is done to screen for osteoporosis. You may have this done starting at age 36. Mammogram. This may be done every 1-2 years. Talk to your health care provider about how often you should have regular mammograms. Talk with your health care provider about your test results, treatment options, and if necessary, the need for more tests. Vaccines  Your health care provider may recommend certain vaccines, such as: Influenza vaccine. This is recommended every year. Tetanus, diphtheria, and acellular pertussis (Tdap, Td) vaccine. You may need a Td booster every 10 years. Zoster  vaccine. You may need this after  age 12. Pneumococcal 13-valent conjugate (PCV13) vaccine. One dose is recommended after age 51. Pneumococcal polysaccharide (PPSV23) vaccine. One dose is recommended after age 47. Talk to your health care provider about which screenings and vaccines you need and how often you need them. This information is not intended to replace advice given to you by your health care provider. Make sure you discuss any questions you have with your health care provider. Document Released: 01/12/2016 Document Revised: 09/04/2016 Document Reviewed: 10/17/2015 Elsevier Interactive Patient Education  2017 [place] Prevention in the Home Falls can cause injuries. They can happen to people of all ages. There are many things you can do to make your home safe and to help prevent falls. What can I do on the outside of my home? Regularly fix the edges of walkways and driveways and fix any cracks. Remove anything that might make you trip as you walk through a door, such as a raised step or threshold. Trim any bushes or trees on the path to your home. Use bright outdoor lighting. Clear any walking paths of anything that might make someone trip, such as rocks or tools. Regularly check to see if handrails are loose or broken. Make sure that both sides of any steps have handrails. Any raised decks and porches should have guardrails on the edges. Have any leaves, snow, or ice cleared regularly. Use sand or salt on walking paths during winter. Clean up any spills in your garage right away. This includes oil or grease spills. What can I do in the bathroom? Use night lights. Install grab bars by the toilet and in the tub and shower. Do not use towel bars as grab bars. Use non-skid mats or decals in the tub or shower. If you need to sit down in the shower, use a plastic, non-slip stool. Keep the floor dry. Clean up any water that spills on the floor as soon as it happens. Remove soap buildup in the tub or shower  regularly. Attach bath mats securely with double-sided non-slip rug tape. Do not have throw rugs and other things on the floor that can make you trip. What can I do in the bedroom? Use night lights. Make sure that you have a light by your bed that is easy to reach. Do not use any sheets or blankets that are too big for your bed. They should not hang down onto the floor. Have a firm chair that has side arms. You can use this for support while you get dressed. Do not have throw rugs and other things on the floor that can make you trip. What can I do in the kitchen? Clean up any spills right away. Avoid walking on wet floors. Keep items that you use a lot in easy-to-reach places. If you need to reach something above you, use a strong step stool that has a grab bar. Keep electrical cords out of the way. Do not use floor polish or wax that makes floors slippery. If you must use wax, use non-skid floor wax. Do not have throw rugs and other things on the floor that can make you trip. What can I do with my stairs? Do not leave any items on the stairs. Make sure that there are handrails on both sides of the stairs and use them. Fix handrails that are broken or loose. Make sure that handrails are as long as the stairways. Check any carpeting to make sure that it  is firmly attached to the stairs. Fix any carpet that is loose or worn. Avoid having throw rugs at the top or bottom of the stairs. If you do have throw rugs, attach them to the floor with carpet tape. Make sure that you have a light switch at the top of the stairs and the bottom of the stairs. If you do not have them, ask someone to add them for you. What else can I do to help prevent falls? Wear shoes that: Do not have high heels. Have rubber bottoms. Are comfortable and fit you well. Are closed at the toe. Do not wear sandals. If you use a stepladder: Make sure that it is fully opened. Do not climb a closed stepladder. Make sure that  both sides of the stepladder are locked into place. Ask someone to hold it for you, if possible. Clearly mark and make sure that you can see: Any grab bars or handrails. First and last steps. Where the edge of each step is. Use tools that help you move around (mobility aids) if they are needed. These include: Canes. Walkers. Scooters. Crutches. Turn on the lights when you go into a dark area. Replace any light bulbs as soon as they burn out. Set up your furniture so you have a clear path. Avoid moving your furniture around. If any of your floors are uneven, fix them. If there are any pets around you, be aware of where they are. Review your medicines with your doctor. Some medicines can make you feel dizzy. This can increase your chance of falling. Ask your doctor what other things that you can do to help prevent falls. This information is not intended to replace advice given to you by your health care provider. Make sure you discuss any questions you have with your health care provider. Document Released: 10/12/2009 Document Revised: 05/23/2016 Document Reviewed: 01/20/2015 Elsevier Interactive Patient Education  2017 [employer].

## 2021-09-12 NOTE — Chronic Care Management (AMB) (Signed)
    Chronic Care Management Pharmacy Assistant   Name: [name]  MRN: [id number] DOB: 1946-10-19   Reason for Encounter: ActX Pharmacogenetic Study/Patient Assistance Documentation.   The ActX pharmacogenomics process and benefits of testing was discussed with the patient. After the discussion, the patient made an informed consent to testing and the sample was collected and mailed to the external lab.   Pattricia Boss, [facility] Pharmacist Assistant [phone]  Note: 09/13/2021- Patient Ozempic arrived to the office from [employer] patient assistance program. Called patient she is aware and will come by today to pick up medication.

## 2021-09-12 NOTE — Patient Instructions (Signed)
Diabetes Mellitus and Nutrition, Adult When you have diabetes, or diabetes mellitus, it is very important to have healthy eating habits because your blood sugar (glucose) levels are greatly affected by what you eat and drink. Eating healthy foods in the right amounts, at about the same times every day, can help you:  Control your blood glucose.  Lower your risk of heart disease.  Improve your blood pressure.  Reach or maintain a healthy weight. What can affect my meal plan? Every person with diabetes is different, and each person has different needs for a meal plan. Your health care provider may recommend that you work with a dietitian to make a meal plan that is best for you. Your meal plan may vary depending on factors such as:  The calories you need.  The medicines you take.  Your weight.  Your blood glucose, blood pressure, and cholesterol levels.  Your activity level.  Other health conditions you have, such as heart or kidney disease. How do carbohydrates affect me? Carbohydrates, also called carbs, affect your blood glucose level more than any other type of food. Eating carbs naturally raises the amount of glucose in your blood. Carb counting is a method for keeping track of how many carbs you eat. Counting carbs is important to keep your blood glucose at a healthy level, especially if you use insulin or take certain oral diabetes medicines. It is important to know how many carbs you can safely have in each meal. This is different for every person. Your dietitian can help you calculate how many carbs you should have at each meal and for each snack. How does alcohol affect me? Alcohol can cause a sudden decrease in blood glucose (hypoglycemia), especially if you use insulin or take certain oral diabetes medicines. Hypoglycemia can be a life-threatening condition. Symptoms of hypoglycemia, such as sleepiness, dizziness, and confusion, are similar to symptoms of having too much  alcohol.  Do not drink alcohol if: ? Your health care provider tells you not to drink. ? You are pregnant, may be pregnant, or are planning to become pregnant.  If you drink alcohol: ? Do not drink on an empty stomach. ? Limit how much you use to:  0-1 drink a day for women.  0-2 drinks a day for men. ? Be aware of how much alcohol is in your drink. In the [place] one drink equals one 12 oz bottle of beer (355 mL), one 5 oz glass of wine (148 mL), or one 1 oz glass of hard liquor (44 mL). ? Keep yourself hydrated with water, diet soda, or unsweetened iced tea.  Keep in mind that regular soda, juice, and other mixers may contain a lot of sugar and must be counted as carbs. What are tips for following this plan? Reading food labels  Start by checking the serving size on the "Nutrition Facts" label of packaged foods and drinks. The amount of calories, carbs, fats, and other nutrients listed on the label is based on one serving of the item. Many items contain more than one serving per package.  Check the total grams (g) of carbs in one serving. You can calculate the number of servings of carbs in one serving by dividing the total carbs by 15. For example, if a food has 30 g of total carbs per serving, it would be equal to 2 servings of carbs.  Check the number of grams (g) of saturated fats and trans fats in one serving. Choose foods that have   a low amount or none of these fats.  Check the number of milligrams (mg) of salt (sodium) in one serving. Most people should limit total sodium intake to less than 2,300 mg per day.  Always check the nutrition information of foods labeled as "low-fat" or "nonfat." These foods may be higher in added sugar or refined carbs and should be avoided.  Talk to your dietitian to identify your daily goals for nutrients listed on the label. Shopping  Avoid buying canned, pre-made, or processed foods. These foods tend to be high in fat, sodium, and added  sugar.  Shop around the outside edge of the grocery store. This is where you will most often find fresh fruits and vegetables, bulk grains, fresh meats, and fresh dairy. Cooking  Use low-heat cooking methods, such as baking, instead of high-heat cooking methods like deep frying.  Cook using healthy oils, such as olive, canola, or sunflower oil.  Avoid cooking with butter, cream, or high-fat meats. Meal planning  Eat meals and snacks regularly, preferably at the same times every day. Avoid going long periods of time without eating.  Eat foods that are high in fiber, such as fresh fruits, vegetables, beans, and whole grains. Talk with your dietitian about how many servings of carbs you can eat at each meal.  Eat 4-6 oz (112-168 g) of lean protein each day, such as lean meat, chicken, fish, eggs, or tofu. One ounce (oz) of lean protein is equal to: ? 1 oz (28 g) of meat, chicken, or fish. ? 1 egg. ?  cup (62 g) of tofu.  Eat some foods each day that contain healthy fats, such as avocado, nuts, seeds, and fish.   What foods should I eat? Fruits Berries. Apples. Oranges. Peaches. Apricots. Plums. Grapes. Mango. Papaya. Pomegranate. Kiwi. Cherries. Vegetables Lettuce. Spinach. Leafy greens, including kale, chard, collard greens, and mustard greens. Beets. Cauliflower. Cabbage. Broccoli. Carrots. Green beans. Tomatoes. Peppers. Onions. Cucumbers. Brussels sprouts. Grains Whole grains, such as whole-wheat or whole-grain bread, crackers, tortillas, cereal, and pasta. Unsweetened oatmeal. Quinoa. Brown or wild rice. Meats and other proteins Seafood. Poultry without skin. Lean cuts of poultry and beef. Tofu. Nuts. Seeds. Dairy Low-fat or fat-free dairy products such as milk, yogurt, and cheese. The items listed above may not be a complete list of foods and beverages you can eat. Contact a dietitian for more information. What foods should I avoid? Fruits Fruits canned with  syrup. Vegetables Canned vegetables. Frozen vegetables with butter or cream sauce. Grains Refined white flour and flour products such as bread, pasta, snack foods, and cereals. Avoid all processed foods. Meats and other proteins Fatty cuts of meat. Poultry with skin. Breaded or fried meats. Processed meat. Avoid saturated fats. Dairy Full-fat yogurt, cheese, or milk. Beverages Sweetened drinks, such as soda or iced tea. The items listed above may not be a complete list of foods and beverages you should avoid. Contact a dietitian for more information. Questions to ask a health care provider  Do I need to meet with a diabetes educator?  Do I need to meet with a dietitian?  What number can I call if I have questions?  When are the best times to check my blood glucose? Where to find more information:  American Diabetes Association: diabetes.org  Academy of Nutrition and Dietetics: www.eatright.org  National Institute of Diabetes and Digestive and Kidney Diseases: www.niddk.nih.gov  Association of Diabetes Care and Education Specialists: www.diabeteseducator.org Summary  It is important to have healthy eating   habits because your blood sugar (glucose) levels are greatly affected by what you eat and drink.  A healthy meal plan will help you control your blood glucose and maintain a healthy lifestyle.  Your health care provider may recommend that you work with a dietitian to make a meal plan that is best for you.  Keep in mind that carbohydrates (carbs) and alcohol have immediate effects on your blood glucose levels. It is important to count carbs and to use alcohol carefully. This information is not intended to replace advice given to you by your health care provider. Make sure you discuss any questions you have with your health care provider. Document Revised: 11/23/2019 Document Reviewed: 11/23/2019 Elsevier Patient Education  2021 [employer]  

## 2021-09-12 NOTE — Progress Notes (Signed)
This visit occurred during the SARS-CoV-2 public health emergency.  Safety protocols were in place, including screening questions prior to the visit, additional usage of staff PPE, and extensive cleaning of exam room while observing appropriate contact time as indicated for disinfecting solutions.  Subjective:   [name] is a 75 y.o. female who presents for Medicare Annual (Subsequent) preventive examination.  Review of Systems     Cardiac Risk Factors include: advanced age (>76men, >58 women);diabetes mellitus;hypertension;obesity (BMI >30kg/m2);sedentary lifestyle     Objective:    Today's Vitals   09/12/21 1404  BP: 120/62  Pulse: 88  Temp: 98.7 F (37.1 C)  TempSrc: Oral  Weight: 198 lb 6.4 oz (90 kg)  Height: 5' 4.8" (1.646 m)   Body mass index is 33.22 kg/m.  Advanced Directives 09/12/2021 09/06/2020 09/01/2019 09/12/2017 09/10/2017 09/28/2016 09/28/2016  Does Patient Have a Medical Advance Directive? No No No No No No No  Would patient like information on creating a medical advance directive? No - Patient declined No - Patient declined - No - Patient declined - No - patient declined information No - patient declined information    Current Medications (verified) Outpatient Encounter Medications as of 09/12/2021  Medication Sig   Accu-Chek FastClix Lancets MISC Inject 1 each as directed daily. Use as directed to check blood sugars daily dx: e11.22   albuterol (VENTOLIN HFA) 108 (90 Base) MCG/ACT inhaler Inhale 2 puffs into the lungs every 4 (four) hours as needed for wheezing or shortness of breath.   aspirin 81 MG tablet Take 81 mg by mouth daily.   Biotin 10000 MCG TBDP Take by mouth.   bisoprolol (ZEBETA) 5 MG tablet TAKE 1/2 TABLET(2.5 MG) BY MOUTH DAILY   Budeson-Glycopyrrol-Formoterol (BREZTRI AEROSPHERE) 160-9-4.8 MCG/ACT AERO Inhale 2 puffs into the lungs 2 (two) times daily.   CALCIUM-MAGNESIUM-ZINC PO Take 1 capsule by mouth daily.   Cholecalciferol (VITAMIN D3)  2000 UNITS TABS Take 2,000 Units by mouth daily.   famotidine (PEPCID) 20 MG tablet One after supper   gabapentin (NEURONTIN) 300 MG capsule Take 1 capsule (300 mg total) by mouth 4 (four) times daily.   glucose blood (ACCU-CHEK GUIDE) test strip 1 each by Other route daily. Use as instructed to check blood sugars 1 time per day dx: e11.22   ipratropium-albuterol (DUONEB) 0.5-2.5 (3) MG/3ML SOLN USE 3 ML VIA NEBULIZER EVERY 6 HOURS AS NEEDED   montelukast (SINGULAIR) 10 MG tablet TAKE 1 TABLET(10 MG) BY MOUTH AT BEDTIME   pantoprazole (PROTONIX) 40 MG tablet TAKE 1 TABLET BY MOUTH 30 MINS BEFORE FIRST MEAL OF THE DAY   rosuvastatin (CRESTOR) 20 MG tablet TAKE 1 TABLET(20 MG) BY MOUTH AT BEDTIME   Semaglutide, 1 MG/DOSE, (OZEMPIC, 1 MG/DOSE,) 4 MG/3ML SOPN Inject 1 mg into the skin once a week.   traMADol (ULTRAM) 50 MG tablet Take 1 tablet (50 mg total) by mouth every 6 (six) hours as needed.   fluticasone (FLONASE) 50 MCG/ACT nasal spray Place 1 spray into both nostrils daily.   predniSONE (DELTASONE) 10 MG tablet Take 4 tabs po daily x 2 days; then 2 tabs for 2 days; then 1 tab for 2 days (Patient not taking: Reported on 09/12/2021)   No facility-administered encounter medications on file as of 09/12/2021.    Allergies (verified) Other   History: Past Medical History:  Diagnosis Date   COPD (chronic obstructive pulmonary disease) (HCC)    Diabetes ([place])    High cholesterol    Hypertension  Pneumonia 09/10/2017   Past Surgical History:  Procedure Laterality Date   ABDOMINAL HYSTERECTOMY  1995   carpel tunnel surgery  2006   COLONOSCOPY  2014   COLOSTOMY  2007   colostomy let down  2008   endoscopy  2014   rotator cuff tear Left 04/14/2020   Family History  Problem Relation Age of Onset   Heart murmur Mother    Heart attack Father    Diabetes Father    Diabetes Brother    Social History   Socioeconomic History   Marital status: Married    Spouse name: Not on file    Number of children: Not on file   Years of education: Not on file   Highest education level: Not on file  Occupational History   Occupation: Retired    Comment: Former Advertising copywriter to Scientist, physiological of education  Tobacco Use   Smoking status: Former    Packs/day: 0.50    Years: 42.00    Pack years: 21.00    Types: Cigarettes    Start date: 01/30/1967    Quit date: 01/30/2006    Years since quitting: 15.6   Smokeless tobacco: Never   Tobacco comments:    intermittent cycles b/w 1/4-1/2ppd  Vaping Use   Vaping Use: Never used  Substance and Sexual Activity   Alcohol use: No   Drug use: No   Sexual activity: Not Currently  Other Topics Concern   Not on file  Social History Narrative   Not on file   Social Determinants of Health   Financial Resource Strain: Low Risk    Difficulty of Paying Living Expenses: Not hard at all  Food Insecurity: No Food Insecurity   Worried About Charity fundraiser in the Last Year: Never true   [place] in the Last Year: Never true  Transportation Needs: No Transportation Needs   Lack of Transportation (Medical): No   Lack of Transportation (Non-Medical): No  Physical Activity: Inactive   Days of Exercise per Week: 0 days   Minutes of Exercise per Session: 0 min  Stress: No Stress Concern Present   Feeling of Stress : Not at all  Social Connections: Socially Integrated   Frequency of Communication with Friends and Family: More than three times a week   Frequency of Social Gatherings with Friends and Family: More than three times a week   Attends Religious Services: More than 4 times per year   Active Member of [employer] or Organizations: Yes   Attends Music therapist: More than 4 times per year   Marital Status: Married    Tobacco Counseling Counseling given: Not Answered Tobacco comments: intermittent cycles b/w 1/4-1/2ppd   Clinical Intake:  Pre-visit preparation completed: Yes  Pain : No/denies pain      Nutritional Status: BMI > 30  Obese Nutritional Risks: None Diabetes: Yes  How often do you need to have someone help you when you read instructions, pamphlets, or other written materials from your doctor or pharmacy?: 1 - Never What is the last grade level you completed in school?: associates degree  Diabetic? Yes Nutrition Risk Assessment:  Has the patient had any N/V/D within the last 2 months?  No  Does the patient have any non-healing wounds?  No  Has the patient had any unintentional weight loss or weight gain?  Yes   Diabetes:  Is the patient diabetic?  Yes  If diabetic, was a CBG obtained today?  No  Did the patient bring in their glucometer from home?  No  How often do you monitor your CBG's? Does not check.   Financial Strains and Diabetes Management:  Are you having any financial strains with the device, your supplies or your medication? No .  Does the patient want to be seen by Chronic Care Management for management of their diabetes?  No  Would the patient like to be referred to a Nutritionist or for Diabetic Management?  No   Diabetic Exams:  Diabetic Eye Exam: Completed 12/04/2020 Diabetic Foot Exam: Overdue, Pt has been advised about the importance in completing this exam. Pt is scheduled for diabetic foot exam on next appointment.   Interpreter Needed?: No  Information entered by :: NAllen LPN   Activities of Daily Living In your present state of health, do you have any difficulty performing the following activities: 09/12/2021  Hearing? N  Vision? N  Difficulty concentrating or making decisions? N  Walking or climbing stairs? Y  Dressing or bathing? N  Doing errands, shopping? N  Preparing Food and eating ? N  Using the Toilet? N  In the past six months, have you accidently leaked urine? Y  Do you have problems with loss of bowel control? N  Managing your Medications? N  Managing your Finances? N  Housekeeping or managing your Housekeeping? N   Some recent data might be hidden    Patient Care Team: [name], MD as PCP - General (Internal Medicine) Rex Kras, Claudette Stapler, RN as Case Manager Mayford Knife, [facility] (Pharmacist)  Indicate any recent Medical Services you may have received from other than [facility] providers in the past year (date may be approximate).     Assessment:   This is a routine wellness examination for Devi.  Hearing/Vision screen Vision Screening - Comments:: Regular eye exams, Dr. Katy Fitch  Dietary issues and exercise activities discussed: Current Exercise Habits: The patient does not participate in regular exercise at present   Goals Addressed             This Visit's Progress    Patient Stated       09/12/2021, no goals       Depression Screen PHQ 2/9 Scores 09/12/2021 09/06/2020 09/01/2019 07/01/2019 06/07/2019 02/03/2019 11/09/2018  PHQ - 2 Score 0 0 3 0 0 0 0  PHQ- 9 Score - - 3 - - - -    Fall Risk Fall Risk  09/12/2021 09/06/2020 01/04/2020 09/01/2019 07/01/2019  Falls in the past year? 0 0 0 0 0  Comment - - - - -  Number falls in past yr: - - - - -  Comment - - - - -  Injury with Fall? - - - - -  Risk for fall due to : Medication side effect Medication side effect - Medication side effect -  Follow up Falls evaluation completed;Education provided;Falls prevention discussed Falls evaluation completed;Education provided;Falls prevention discussed - Falls evaluation completed;Falls prevention discussed -    FALL RISK PREVENTION PERTAINING TO THE HOME:  Any stairs in or around the home? Yes  If so, are there any without handrails? No  Home free of loose throw rugs in walkways, pet beds, electrical cords, etc? Yes  Adequate lighting in your home to reduce risk of falls? Yes   ASSISTIVE DEVICES UTILIZED TO PREVENT FALLS:  Life alert? No  Use of a cane, walker or w/c? No  Grab bars in the bathroom? Yes  Shower chair or bench in shower? No  Elevated toilet seat or a handicapped toilet? No   TIMED  UP AND GO:  Was the test performed? No .    Gait steady and fast without use of assistive device  Cognitive Function:     6CIT Screen 09/12/2021 09/06/2020 09/01/2019  What Year? 0 points 0 points 0 points  What month? 0 points 0 points 0 points  What time? 0 points 0 points 0 points  Count back from 20 0 points 0 points 0 points  Months in reverse 0 points 0 points 0 points  Repeat phrase 2 points 0 points 0 points  Total Score 2 0 0    Immunizations Immunization History  Administered Date(s) Administered   Fluad Quad(high Dose 65+) 09/06/2020, 09/12/2021   Influenza Split 09/29/2013, 09/30/2015   Influenza Whole 08/30/2016   Influenza, High Dose Seasonal PF 09/29/2017, 09/14/2018, 09/01/2019   Influenza,inj,Quad PF,6+ Mos 09/12/2014   Influenza-Unspecified 09/14/2018   PFIZER(Purple Top)SARS-COV-2 Vaccination 01/19/2020, 02/09/2020, 12/21/2020   Pneumococcal Conjugate-13 01/06/2020   Pneumococcal Polysaccharide-23 10/07/2013   Pneumococcal-Unspecified 10/05/2013   Tdap 06/07/2019, 06/09/2019   Zoster Recombinat (Shingrix) 07/26/2020, 10/09/2020    TDAP status: Up to date  Flu Vaccine status: Completed at today's visit  Pneumococcal vaccine status: Up to date  Covid-19 vaccine status: Completed vaccines  Qualifies for Shingles Vaccine? Yes   Zostavax completed No   Shingrix Completed?: Yes  Screening Tests Health Maintenance  Topic Date Due   COVID-19 Vaccine (4 - Booster for Pfizer series) 03/15/2021   FOOT EXAM  09/06/2021   URINE MICROALBUMIN  09/06/2021   HEMOGLOBIN A1C  10/04/2021   OPHTHALMOLOGY EXAM  12/04/2021   MAMMOGRAM  02/13/2022   COLONOSCOPY (Pts 45-67yrs Insurance coverage will need to be confirmed)  04/30/2023   TETANUS/TDAP  06/08/2029   INFLUENZA VACCINE  Completed   DEXA SCAN  Completed   Hepatitis C Screening  Completed   PNA vac Low Risk Adult  Completed   Zoster Vaccines- Shingrix  Completed   HPV VACCINES  Aged Out    Health  Maintenance  Health Maintenance Due  Topic Date Due   COVID-19 Vaccine (4 - Booster for [place] series) 03/15/2021   FOOT EXAM  09/06/2021   URINE MICROALBUMIN  09/06/2021    Colorectal cancer screening: Type of screening: Colonoscopy. Completed 04/29/2013. Repeat every 10 years  Mammogram status: Completed 02/13/2021. Repeat every year  Bone Density status: Completed 03/02/2015.   Lung Cancer Screening: (Low Dose CT Chest recommended if Age 36-80 years, 30 pack-year currently smoking OR have quit w/in 15years.) does not qualify.   Lung Cancer Screening Referral: no  Additional Screening:  Hepatitis C Screening: does qualify; Completed 05/19/2013  Vision Screening: Recommended annual ophthalmology exams for early detection of glaucoma and other disorders of the eye. Is the patient up to date with their annual eye exam?  Yes  Who is the provider or what is the name of the office in which the patient attends annual eye exams? Dr. Katy Fitch  If pt is not established with a provider, would they like to be referred to a provider to establish care? No .   Dental Screening: Recommended annual dental exams for proper oral hygiene  Community Resource Referral / Chronic Care Management: CRR required this visit?  No   CCM required this visit?  No      Plan:     I have personally reviewed and noted the following in the patient's chart:   Medical and social history Use of alcohol,  tobacco or illicit drugs  Current medications and supplements including opioid prescriptions.  Functional ability and status Nutritional status Physical activity Advanced directives List of other physicians Hospitalizations, surgeries, and ER visits in previous 12 months Vitals Screenings to include cognitive, depression, and falls Referrals and appointments  In addition, I have reviewed and discussed with patient certain preventive protocols, quality metrics, and best practice recommendations. A written  personalized care plan for preventive services as well as general preventive health recommendations were provided to patient.     Kellie Simmering, LPN   2/84/1324   Nurse Notes:

## 2021-09-13 ENCOUNTER — Other Ambulatory Visit: Payer: Self-pay

## 2021-09-13 ENCOUNTER — Telehealth: Payer: Self-pay

## 2021-09-13 ENCOUNTER — Encounter: Payer: Self-pay | Admitting: Internal Medicine

## 2021-09-13 ENCOUNTER — Ambulatory Visit (INDEPENDENT_AMBULATORY_CARE_PROVIDER_SITE_OTHER): Payer: Medicare Other | Admitting: Internal Medicine

## 2021-09-13 DIAGNOSIS — J449 Chronic obstructive pulmonary disease, unspecified: Secondary | ICD-10-CM

## 2021-09-13 DIAGNOSIS — I2510 Atherosclerotic heart disease of native coronary artery without angina pectoris: Secondary | ICD-10-CM

## 2021-09-13 DIAGNOSIS — R059 Cough, unspecified: Secondary | ICD-10-CM

## 2021-09-13 DIAGNOSIS — R911 Solitary pulmonary nodule: Secondary | ICD-10-CM | POA: Diagnosis not present

## 2021-09-13 LAB — MICROALBUMIN / CREATININE URINE RATIO
Creatinine, Urine: 38.1 mg/dL
Microalb/Creat Ratio: 11 mg/g creat (ref 0–29)
Microalbumin, Urine: 4.3 ug/mL

## 2021-09-13 LAB — CK: Total CK: 289 U/L — ABNORMAL HIGH (ref 32–182)

## 2021-09-13 LAB — MAGNESIUM: Magnesium: 2.4 mg/dL — ABNORMAL HIGH (ref 1.6–2.3)

## 2021-09-13 MED ORDER — PREDNISONE 10 MG PO TABS: ORAL_TABLET | ORAL | 5 refills | Status: DC

## 2021-09-13 NOTE — Assessment & Plan Note (Signed)
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     She does not process why we can't just remove the nodule despite the fact she c/o resting sob with pan exp wheeze on exam she says "never goes away"  (records suggest otherwise but she would have to be in much better shape than now to consider ever RULobectomy)

## 2021-09-13 NOTE — Assessment & Plan Note (Signed)
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 - try off cozar 11/03/2014 >>> - neurontin 100 tid rx 11/30/14 > improved 01/03/2015 > changed to lyrica 04/2015 but not taking consistently as of 01/30/2016  - sinus CT 02/17/15 > neg  - FENO  04/14/17 =  14  - rechallenged with gabapentin 300 qid 07/22/2018 and if not better > refer to Dr Joya Gaskins at [facility] voice center > did not go, referred again 01/07/2019 > see eval 03/02/2019 rec speech therapy/tramadol (pt refused latter)   - flare early Sep 2021 off gabapentin and on breztri > changed back to gabapentin / symb 80 2bid > preferred breztri  - 11/21/2020 rec increase gabapentin to 300 qid to eliminate need for tramadol  - 12/28/2020 acute breakthru despite following instructions x for bed blocks > rx oxy 5 mg q 4 hr prn - HRCT  08/23/21  1. No findings to suggest interstitial lung disease. 2. However, there is a 1.6 x 1.6 x 1.8 cm aggressive appearing nodule in the posterior aspect of the right upper lobe which is highly concerning for primary bronchogenic neoplasm. No definite mediastinal or hilar lymphadenopathy confidently identified on today's noncontrast CT examination. Further evaluation with PET-CT is recommended in the near future to better evaluate this finding. 3. Diffuse bronchial wall thickening with mild centrilobular and paraseptal emphysema; imaging findings suggestive of underlying COPD.   Extremely challenging chronic problem, already eval by ENT / pulmonary at [facility] with no response to rx. Best rx to date = gabapentin in high doses or moderate doses plus tramadol which I told her today I would no longer be able to supply without 1st doing complete and accurate medication reconciliation and after  which she became angry and left before the w/u was complete ( with much frustration expressed on both sides of the conversation about communication and  working together to solve her problem  - since unable to meet me half way with med reconciliation, I told her I could not see her in the future without this 1st).           Each maintenance medication was reviewed in detail including emphasizing most importantly the difference between maintenance and prns and under what circumstances the prns are to be triggered using an action plan format where appropriate.  Total time for H and P, chart review, counseling, reviewing hfa/neb device(s) and generating customized AVS unique to this office visit / same day charting  > 40 min

## 2021-09-13 NOTE — Patient Instructions (Addendum)
Plan A = Automatic = Always=   Breztri Take 2 puffs first thing in am and then another 2 puffs about 12 hours later.  Gabapentin 300 mg four times a daily    Work on inhaler technique:  relax and gently blow all the way out then take a nice smooth full deep breath back in, triggering the inhaler at same time you start breathing in.  Hold for up to 5 seconds if you can. Blow out thru nose. Rinse and gargle with water when done.  If mouth or throat bother you at all,  try brushing teeth/gums/tongue with arm and hammer toothpaste/ make a slurry and gargle and spit out.   Plan B = Backup (to supplement plan A, not to replace it) (unless cough is the issue in which case more on to plan C) Only use your albuterol inhaler as a rescue medication to be used if you can't catch your breath by resting or doing a relaxed purse lip breathing pattern.  - The less you use it, the better it will work when you need it. - Ok to use the inhaler up to 2 puffs  every 4 hours if you must but call for appointment if use goes up over your usual need - Don't leave home without it !!  (think of it like the spare tire for your car)   Plan C = Crisis (instead of Plan B but only if Plan B stops working) - only use your albuterol nebulizer if you first try Plan B and it fails to help > ok to use the nebulizer up to every 4 hours but if start needing it regularly call for immediate appointment   Plan D = Deltasone 10 mg  if  ABC not helping  Prednisone 10 mg take  4 each am x 2 days,   2 each am x 2 days,  1 each am x 2 days and stop    I will be referring you to Dr Windell Norfolk who is expert at lung nodules like yours bring medications/ solutions/ devices   We will cancel the referral to surgery for now as your breathing is not good enough to offer surgery.

## 2021-09-13 NOTE — Progress Notes (Signed)
Subjective:   Patient ID: [name], female    DOB: 1946-07-27    MRN: [id number]    Brief patient profile:  17  yobf mother of a PA @ [place]  fast med  quit smoking 2007 with chronic cough x 2011 self referred for evaluation of persistent cough in setting of technically GOLD II copd dx 02/2014 with only mild obstructive pattern - has noted since high school a tendency to daily  tickle in throat causing intermittent cough waxing and waning ever since    History of Present Illness  10/19/2014 1st [facility] Pulmonary office visit/ Hendrixx Severin   Chief Complaint  Patient presents with   Pulmonary Consult    Former pt of Dr. Annamaria Boots. Pt c/o increased cough x 3 wks. Cough is prod with moderate, thick, light yellow sputum.  She coughs until loses urinary continence.  Not sleeping well due to cough.   remembers having tendency to throat tickle in high school  but completely resolved by graduation  then recurred in her 64s and resolved with codeine then recurred in 2011  but bad to worse x 3 weeks assoc with sensation of choking, mucus is thick light yellow.  acei stopped about a month prior to OV  Replaced by losartan  Inhalers make it worse, esp dpi  Kouffman Reflux v Neurogenic Cough Differentiator Reflux Comments  Do you awaken from a sound sleep coughing violently?                            With trouble breathing? Yes   Do you have choking episodes when you cannot  Get enough air, gasping for air ?              Yes   Do you usually cough when you lie down into  The bed, or when you just lie down to rest ?                          Yes   Do you usually cough after meals or eating?         maybe   Do you cough when (or after) you bend over?    No    GERD SCORE     Kouffman Reflux v Neurogenic Cough Differentiator Neurogenic   Do you more-or-less cough all day long? yes   Does change of temperature make you cough? no   Does laughing or chuckling cause you to cough? no   Do fumes (perfume,  automobile fumes, burned  Toast, etc.,) cause you to cough ?      Not aware   Does speaking, singing, or talking on the phone cause you to cough   ?               sometimes   Neurogenic/Airway score      rec  Max rx gerd/prn tramadol        09/15/2017  Transition of care  f/u ov/Lavaeh Bau re: COPD II s/p aecopd Chief Complaint  Patient presents with   Hospitalization Follow-up    Breathing has improved some, but not back at her normal baseline. She states her cough woke her up this am and had some bloody nasal d/c.  She has been coughing up some thick, yellow sputum.  She is using   walking all over [place] fine,  Flew back to [place]  on a commercial jet and short  of breath  walking from gate to baggage claim then by 2 days later in ER due to severe sob / tightness across upper back >> albuterol relived the discomfort w/in 5 min but did not have her rescue so had to call 911 (proair listed on med calendar she just got from me 08/19/17 under written action plan) - says was so short of breath at that point could not have used saba anyway. Not following action plan either re controlling cough which is also worse/ has not started meds from discharge  rec Plan A = Automatic = symbicort 80 Take 2 puffs first thing in am and then another 2 puffs about 12 hours later.  Work on Interior and spatial [employer]: Plan B = Backup Only use your albuterol as a rescue medication       03/23/2018 acute extended  ov/Aslyn Cottman re:   Copd  - extended ov re new noct cough /wheeze and need for saba / no med calendar Chief Complaint  Patient presents with   Acute Visit    Breathing is overall doing well. She uses her albuterol inhaler 4 x per wk on average. She has not needed her neb.  Mid February breathing /coughing  worse / wheezing every noct needs albuterol every noct Very confused with instructions/ now has neb but did not disclose med she uses at the med calendar ov and did not bring the med calendar with her nor  the med for the nebulizer and easily frustrated re concept of med reconciliation  Daytime sob = MMRC2 = can't walk a nl pace on a flat grade s sob but does fine slow and flat  rec Plan A = Automatic = symbicort 160 Take 2 puffs first thing in am and then another 2 puffs about 12 hours later.  Plan B = Backup Only use your albuterol as a rescue medication  Plan C = Crisis - only use your albuterol nebulizer if you first try Plan B and it fails to help > ok to use the nebulizer up to every 4 hours but if start needing it regularly call for immediate appointment     04/20/2018  f/u ov/Tito Ausmus re:   Copd gold II / brought med calendar but not clear using consistently / chronic cough/ hbp Chief Complaint  Patient presents with   Follow-up    COPD , feel good no problems at this time  Dyspnea:  Dances   2- 3 songs / shopping is ok  Cough: better, mostly dry/ throat clearing / noct  Tessalon maybe once every couple days  "it says to take it 3 x daily"  Sleep: better  while maint on symbicort 160 2bid Sleeping fine on 1 pillow     Symbicort 80 or 160 should be  Take 2 puffs first thing in am and then another 2 puffs about 12 hours later- you can pick which one you prefer but if  they are the same then use the lower strength Please schedule a follow up visit in 3 months but call sooner if needed     07/22/2018  f/u ov/Lory Nowaczyk re:   GOLD II copd / has calendar not using the action plans at bottom/ since previous ov stopped lyrica and took gabapentin 100 tid x 3 weeks then stopped (was supposed to stay on until ov) Chief Complaint  Patient presents with   Follow-up    Cough has improved slightly but "still driving me crazy"- prod with clear sputum.  She  uses her proair 2 x per wk and rarely uses neb.   Dyspnea:   MMRC2 = can't walk a nl pace on a flat grade s sob but does fine slow and flat  Cough  Tickle/ gag anytime day except doesn't wake her up goes back to childhood SABA use: twice daily on bad  days but recently less / hardly ever uses neb  Says only thing that stops her cough is oxycodone rec Take delsym two tsp every 12 hours and supplement if needed with  Oxycodone  up to 1 every 4 hours to suppress the urge to cough.  Once you have eliminated the cough for 3 straight days try reducing the oxycodone first,  then the delsym as tolerated.   For drainage / throat tickle try take CHLORPHENIRAMINE  4 mg - take one every 4 hours as needed   Reduce symbicort 80 Take 2 puffs first thing in am and then another 2 puffs about 12 hours later. > did not continue consistently "you didn't make this clear"  Gabapentin 300 mg four times a day - if not doing better we need to refer you to [facility] / Dr Joya Gaskins > did not do  See Tammy NP in 6  weeks with all your medications>  Did not do    01/07/2019  Acute extended  ov/Zenna Traister re:  Flare of wheeze off symbicort, cough never resolved / no med calendar/ poor insight into meds/ action plans  Chief Complaint  Patient presents with   Acute Visit    pt c/o wheezing, prod cough with gray/white mucus, pnd, stuffy nose.   Dyspnea:  MMRC3 = can't walk 100 yards even at a slow pace at a flat grade s stopping due to sob   Cough: severe fits of coughing day and noc  Sleeping: poorly due to cough and wheeze SABA use: qid instead of symbicort  rec Plan A = Automatic = Symbicort 80 Take 2 puffs first thing in am and then another 2 puffs about 12 hours later.  Plan B = Backup (add this to Plan A, not in place of it)  Only use your albuterol inhaler as a rescue medication Plan C = Crisis - only use your albuterol nebulizer if you first try Plan B For drainage / throat tickle try take CHLORPHENIRAMINE  4 mg(Chlortab at [facility]) -  Take delsym two tsp every 12 hours and supplement if needed with  vicodine up to 2 every 4 hours to suppress the urge to cough. Swallowing water and/or using ice chips/non mint and menthol containing candies (such as lifesavers or  sugarless jolly ranchers) are also effective.  You should rest your voice and avoid activities that you know make you cough. Once you have eliminated the cough for 3 straight days try reducing the vicodin first,  then the delsym as tolerated.   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 We discussed treatment options including observation, continuing PPI therapy, an evaluation in the voice lab to assess her candidacy for voice therapy and respiratory retraining, or a trial of tramadol.  04/16/2019 acute extended ov/ Marguerite Barba re recurrent cough on gabapentin 300 tid / did not fill tramadol "I told Dr Joya Gaskins it wouldn't work" "only better with oxycodone"  Chief Complaint  Patient presents with   Acute Visit    Increased cough x 3 wks- prod with white sputum. She has had some wheezing also. She is using her albuterol inhaler at least once per day and rarely uses neb.  Dyspnea:  Minimal  Cough: worse tickle/ no h1 on hand says plans to pick it up  Sleeping: fine 2 pillows / not typically waking her up  SABA use: rarely  02: none  Doesn't think delsym helped/ no h1 on hand / using gabapentin 300 mg tid   gen chest discomfort with severe coughing fits  rec Prednisone 10 mg take  4 each am x 2 days,   2 each am x 2 days,  1 each am x 2 days and stop  Take delsym two tsp every 12 hours and supplement if needed with oxidocone 5 mg up to 2 every 4 hours to suppress the urge to cough.   Once you have eliminated the cough for 3 straight days try reducing the oxycodone first,  then the delsym as tolerated.   Keep your appts with speech therapy  Please schedule a follow up visit in  3 months but call sooner if needed  with all medications /inhalers/ solutions in hand so we can verify exactly what you are taking. This includes all medications from all doctors and over the counters     11/21/2020  f/u ov/Rian Busche re:  GOLD II / breztri better than symbicort  Chief Complaint  Patient presents with   Follow-up    She states she has alot of SOB when she wakes up in the morning. She rarely uses her albuterol inhaler, but has been using neb almost every night.   Dyspnea:  Gets up around 630 am does better after taking am breztri   Cough: sporadic but dry  / less need for tramadol on gabapentin usually just taking 300 bid  Sleeping: fine 2 pillows  SABA use: every other night sev hours p supper  feels like needs nebulizer even p pm dose of breztri 02: none rec Change gabapentin to 300 mg 4 x daily  Change pepcid 20 mg after supper  Only use your albuterol as a rescue medication  Bed blocks are strongly recommended    02/26/2021  f/u ov/Georges Victorio re:  COPD  Gold II/ beztri 2bid/ rarely using saba  Chief Complaint  Patient presents with   Follow-up    Doing well  Dyspnea: 20 min walking in place daily  Cough: no am cough Sleeping: no bed blocks but not resp issues  SABA use: rarely  02: none  Covid status:   vax max  Rec No change medications        07/23/21 televisit Plan A = Automatic = Always=    Breztri Take 2 puffs first thing in am and then another 2 puffs about 12 hours later.  Gabapentin 300 mg four times daily until cough is gone then taper twice daily.  Plan B = Backup (to supplement plan A, not to replace it) Only use your albuterol inhaler as a rescue medication  Plan C = Crisis (instead of Plan B but only if Plan B stops working) - only use your albuterol nebulizer if you first try Plan B and it fails to help > ok to use the nebulizer  up to every 4 hours but if start needing it regularly call for immediate appointment Prednisone 10 mg take  4 each am x 2  days,   2 each am x 2 days,  1 each am x 2 days and stop  Please schedule a follow up office visit in 6 weeks, call sooner if needed - bring all your medications /inhalers/ solutions Check cxr  08/05/21 ? 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 Did not bring meds, getting samples from PCP  Chief Complaint  Patient presents with   Follow-up    Increased noct cough for the past 2-3 days- non prod, tramadol helps.   Dyspnea:  can do slow walk = MMRC2 = can't walk a nl pace on a flat grade s sob but does says fine slow and flat but then having fits of sob a rest.  Cough: worse x sev days / tramadol 50 mg one daily but still clearing throat  Sleeping: able to lie down on 3 pillows  SABA use: neb > hfa  02: none  Covid status:   vax x 3    No obvious patterns in day to day or daytime variability or assoc excess/ purulent sputum or mucus plugs or hemoptysis or cp or chest tightness, subjective wheeze or overt sinus or hb symptoms.   Sleeping most nights  without nocturnal  or early am exacerbation  of respiratory  c/o's or need for noct saba. Also denies any obvious fluctuation of symptoms with weather or environmental changes or other aggravating or alleviating factors except as outlined above   No unusual exposure hx or h/o childhood pna/ asthma or knowledge of premature birth.  Current Allergies, Complete Past Medical History, Past Surgical History, Family History, and Social History were reviewed in [employer] record.  ROS  The following are not active complaints unless bolded Hoarseness, sore throat, dysphagia, dental problems, itching, sneezing,  nasal congestion or discharge of excess mucus or purulent secretions, ear ache,   fever, chills, sweats, unintended wt loss or wt gain, classically pleuritic or exertional  cp,  orthopnea pnd or arm/hand swelling  or leg swelling, presyncope, palpitations, abdominal pain, anorexia, nausea, vomiting, diarrhea  or change in bowel habits or change in bladder habits, change in stools or change in urine, dysuria, hematuria,  rash, arthralgias, visual complaints, headache, numbness, weakness or ataxia or problems with walking or coordination,  change in mood or  memory.        Current Meds - - NOTE:   Unable to verify as accurately reflecting what pt takes    Medication Sig   Accu-Chek FastClix Lancets MISC Inject 1 each as directed daily. Use as directed to check blood sugars daily dx: e11.22   albuterol (VENTOLIN HFA) 108 (90 Base) MCG/ACT inhaler Inhale 2 puffs into the lungs every 4 (four) hours as needed for wheezing or shortness of breath.   aspirin 81 MG tablet Take 81 mg by mouth daily.   Biotin 10000 MCG TBDP Take by mouth.   bisoprolol (ZEBETA) 5 MG tablet TAKE 1/2 TABLET(2.5 MG) BY MOUTH DAILY   Budeson-Glycopyrrol-Formoterol (BREZTRI AEROSPHERE) 160-9-4.8 MCG/ACT AERO Inhale 2 puffs into the lungs 2 (two) times daily.   CALCIUM-MAGNESIUM-ZINC PO Take 1 capsule by mouth daily.  Cholecalciferol (VITAMIN D3) 2000 UNITS TABS Take 2,000 Units by mouth daily.   famotidine (PEPCID) 20 MG tablet One after supper   gabapentin (NEURONTIN) 300 MG capsule Take 1 capsule (300 mg total) by mouth 4 (four) times daily.   glucose blood (ACCU-CHEK GUIDE) test strip 1 each by Other route daily. Use as instructed to check blood sugars 1 time per day dx: e11.22   ipratropium-albuterol (DUONEB) 0.5-2.5 (3) MG/3ML SOLN USE 3 ML VIA NEBULIZER EVERY 6 HOURS AS NEEDED   montelukast (SINGULAIR) 10 MG tablet TAKE 1 TABLET(10 MG) BY MOUTH AT BEDTIME   pantoprazole (PROTONIX) 40 MG tablet TAKE 1 TABLET BY MOUTH 30 MINS BEFORE FIRST MEAL OF THE DAY   rosuvastatin (CRESTOR) 20 MG tablet TAKE 1 TABLET(20 MG) BY MOUTH AT BEDTIME   Semaglutide, 1 MG/DOSE, (OZEMPIC, 1 MG/DOSE,) 4 MG/3ML SOPN  Inject 1 mg into the skin once a week.   traMADol (ULTRAM) 50 MG tablet Take 1 tablet (50 mg total) by mouth every 6 (six) hours as needed.              Objective:   Physical Exam   09/13/2021      197   02/26/2021      191 11/21/2020    191  04/16/2019      193 07/22/2018      195 04/20/2018      189  10/23/2016     190 04/07/2015         207    Vital signs reviewed  09/13/2021  - Note at rest 02 sats  95% on RA   General appearance:    amb bf nad   HEENT : pt wearing mask not removed for exam due to covid -19 concerns.    NECK :  without JVD/Nodes/TM/ nl carotid upstrokes bilaterally   LUNGS: no acc muscle use,  Mod barrel  contour chest wall with bilateral  pan exp wheeze and  without cough on insp or exp maneuvers and mod  Hyperresonant  to  percussion bilaterally     CV:  RRR  no s3 or murmur or increase in P2, and no edema   ABD:  soft and nontender with pos mid insp Hoover's  in the supine position. No bruits or organomegaly appreciated, bowel sounds nl  MS:     ext warm without deformities, calf tenderness, cyanosis or clubbing No obvious joint restrictions   SKIN: warm and dry without lesions    NEURO:  alert, approp, nl sensorium with  no motor or cerebellar deficits apparent.

## 2021-09-13 NOTE — Progress Notes (Signed)
Referral not placed- per MW cancel referral to surgery as breathing is not good enough to offer surgery- seen in office 09/13/21

## 2021-09-13 NOTE — Assessment & Plan Note (Signed)
Quit smoking 2007 PFT: 03/17/2014-  FEV1  1.28 (58%) ratio 66 and no better p saba, ERV 27 and DLCO 56 corrects to 77 - 11/30/2014   try dulera 100 2bid > 01/03/2015 not clear helping,  use prn > flare since mid 02/2015 cough > sob  - 04/07/2015 p extensive coaching HFA effectiveness =    90% > try dulera 100 2bid > improved 04/21/15   - 06/01/2015   try dulera 200 2bid  > did not maintain  - Spirometry 04/14/2017  FEV1 1.13 (58%)  Ratio 52 p saba w/in 4 h > add symb 80 2bid   - 08/19/2017    resumed sym 80 2bid -  03/23/2018    try symb 160 due to new noct cough/ wheeze > improved 04/20/2018  - 07/22/2018   try symb 80 2bid due to UACS - [facility] rec add spiriva smi > pt declined and did not return for f/u   DDX of  difficult airways management almost all start with A and  include Adherence, Ace Inhibitors, Acid Reflux, Active Sinus Disease, Alpha 1 Antitripsin deficiency, Anxiety masquerading as Airways dz,  ABPA,  Allergy(esp in young), Aspiration (esp in elderly), Adverse effects of meds,  Active smoking or vaping, A bunch of PE's (a small clot burden can't cause this syndrome unless there is already severe underlying pulm or vascular dz with poor reserve) plus two Bs  = Bronchiectasis and Beta blocker use..and one C= CHF   Adherence is always the initial "prime suspect" and is a multilayered concern that requires a "trust but verify" approach in every patient - starting with knowing how to use medications, especially inhalers, correctly, keeping up with refills and understanding the fundamental difference between maintenance and prns vs those medications only taken for a very short course and then stopped and not refilled.  - pt unable or unwilling do bring in meds as requested at last AVS so hard to be sure the problem is not adherence related but she is very poorly controlled now and not a candidate for lobectomy  >>> advised return to see DR Windell Norfolk to discuss fob but needs to return  with all meds in hand using a trust but verify approach to confirm accurate Medication  Reconciliation The principal here is that until we are certain that the  patients are doing what we've asked, it makes no sense to ask them to do more.   ? Acid (or non-acid) GERD > always difficult to exclude as up to 75% of pts in some series report no assoc GI/ Heartburn symptoms> rec continue max (24h)  acid suppression and diet restrictions/ reviewed     ? Allergy  >  Already on high dose ics/ singulair  >>>> also so added 6 day taper off  Prednisone starting at 40 mg per day in case of component of Th-2 driven upper or lower airways inflammation (if cough responds short term only to relapse before return while will on full rx for uacs (as above), then  that would point to allergic rhinitis/ asthma or eos bronchitis as alternative dx)    ? Anxiety/depression olaying a role here >  Dx of exclusion >> Refer to PCP   ? Adverse effects of meds > none of the usual suspects listed but unable to do full med reconciliation today.  ? Chf/ cardiac component > informed by pt that she is being referred to cards and I agree this is reasonable

## 2021-09-16 DIAGNOSIS — M791 Myalgia, unspecified site: Secondary | ICD-10-CM | POA: Insufficient documentation

## 2021-09-16 DIAGNOSIS — I700 Atherosclerosis of aorta: Secondary | ICD-10-CM | POA: Insufficient documentation

## 2021-09-16 DIAGNOSIS — I2510 Atherosclerotic heart disease of native coronary artery without angina pectoris: Secondary | ICD-10-CM | POA: Insufficient documentation

## 2021-09-17 ENCOUNTER — Other Ambulatory Visit: Payer: Self-pay

## 2021-09-17 DIAGNOSIS — E1165 Type 2 diabetes mellitus with hyperglycemia: Secondary | ICD-10-CM

## 2021-09-18 ENCOUNTER — Telehealth: Payer: Self-pay

## 2021-09-18 ENCOUNTER — Encounter: Payer: Self-pay | Admitting: Internal Medicine

## 2021-09-18 ENCOUNTER — Other Ambulatory Visit: Payer: BC Managed Care – PPO

## 2021-09-19 ENCOUNTER — Other Ambulatory Visit: Payer: Self-pay

## 2021-09-19 ENCOUNTER — Other Ambulatory Visit: Payer: Medicare Other

## 2021-09-19 DIAGNOSIS — E1165 Type 2 diabetes mellitus with hyperglycemia: Secondary | ICD-10-CM | POA: Diagnosis not present

## 2021-09-19 LAB — BMP8+EGFR
BUN/Creatinine Ratio: 6 — ABNORMAL LOW (ref 12–28)
BUN: 5 mg/dL — ABNORMAL LOW (ref 8–27)
CO2: 25 mmol/L (ref 20–29)
Calcium: 9.5 mg/dL (ref 8.7–10.3)
Chloride: 102 mmol/L (ref 96–106)
Creatinine, Ser: 0.82 mg/dL (ref 0.57–1.00)
Glucose: 73 mg/dL (ref 65–99)
Potassium: 4.7 mmol/L (ref 3.5–5.2)
Sodium: 141 mmol/L (ref 134–144)
eGFR: 75 mL/min/{1.73_m2} (ref >59)

## 2021-09-19 LAB — HEMOGLOBIN A1C
Est. average glucose Bld gHb Est-mCnc: 154 mg/dL
Hgb A1c MFr Bld: 7 % — ABNORMAL HIGH (ref 4.8–5.6)

## 2021-09-21 ENCOUNTER — Encounter: Payer: Self-pay | Admitting: Internal Medicine

## 2021-09-27 ENCOUNTER — Telehealth: Payer: Self-pay

## 2021-09-27 ENCOUNTER — Encounter: Payer: Self-pay | Admitting: Internal Medicine

## 2021-09-27 NOTE — Telephone Encounter (Signed)
Error

## 2021-09-27 NOTE — Progress Notes (Signed)
Patient sent a message stating she is out of breztri. Attempted to contact [employer] [facility] patient assistance to follow up and the automated system stated medication was shipped and return in June and I would have to speak to a representative. Was unable to hold due to the extremely long wait time. Pattricia Boss PTM provided an alternative number to [facility] me pharmacy [facility] [phone] and was told they were unable to look up anything for [facility] and I would have to talk to [facility] me. Informed patient and suggested that if she had some time to call [employer] and to try and set an online account up. Patient stated she has time to call the company and will follow up with me.  [place] Pharmacist Assistant [phone]

## 2021-10-03 ENCOUNTER — Telehealth: Payer: Self-pay

## 2021-10-03 NOTE — Chronic Care Management (AMB) (Signed)
    Chronic Care Management Pharmacy Assistant   Name: [name]  MRN: [id number] DOB: September 24, 1946  Reason for Encounter: Patient Assistance Coordination  10/03/2021- Called patient to follow up on [place] supply from [employer]. No answer, left message that [employer] is behind in their deliveries due to an update that was done last month, informed that her enrollment is good until 12/29/2021 so her refill should be coming soon. Left [employer] phone number for patient to follow up on shipment.    10/12/2021- Patient had a visit with Pulmonology 10/08/2021.   Medications: Outpatient Encounter Medications as of 10/03/2021  Medication Sig   Accu-Chek FastClix Lancets MISC Inject 1 each as directed daily. Use as directed to check blood sugars daily dx: e11.22   albuterol (VENTOLIN HFA) 108 (90 Base) MCG/ACT inhaler Inhale 2 puffs into the lungs every 4 (four) hours as needed for wheezing or shortness of breath.   aspirin 81 MG tablet Take 81 mg by mouth daily.   Biotin 10000 MCG TBDP Take by mouth.   bisoprolol (ZEBETA) 5 MG tablet TAKE 1/2 TABLET(2.5 MG) BY MOUTH DAILY   Budeson-Glycopyrrol-Formoterol (BREZTRI AEROSPHERE) 160-9-4.8 MCG/ACT AERO Inhale 2 puffs into the lungs 2 (two) times daily.   CALCIUM-MAGNESIUM-ZINC PO Take 1 capsule by mouth daily.   Cholecalciferol (VITAMIN D3) 2000 UNITS TABS Take 2,000 Units by mouth daily.   famotidine (PEPCID) 20 MG tablet One after supper   gabapentin (NEURONTIN) 300 MG capsule Take 1 capsule (300 mg total) by mouth 4 (four) times daily.   glucose blood (ACCU-CHEK GUIDE) test strip 1 each by Other route daily. Use as instructed to check blood sugars 1 time per day dx: e11.22   ipratropium-albuterol (DUONEB) 0.5-2.5 (3) MG/3ML SOLN USE 3 ML VIA NEBULIZER EVERY 6 HOURS AS NEEDED   montelukast (SINGULAIR) 10 MG tablet TAKE 1 TABLET(10 MG) BY MOUTH AT BEDTIME   pantoprazole (PROTONIX) 40 MG tablet TAKE 1 TABLET BY MOUTH 30 MINS BEFORE FIRST  MEAL OF THE DAY   predniSONE (DELTASONE) 10 MG tablet Take  4 each am x 2 days,   2 each am x 2 days,  1 each am x 2 days and stop   rosuvastatin (CRESTOR) 20 MG tablet TAKE 1 TABLET(20 MG) BY MOUTH AT BEDTIME   Semaglutide, 1 MG/DOSE, (OZEMPIC, 1 MG/DOSE,) 4 MG/3ML SOPN Inject 1 mg into the skin once a week.   traMADol (ULTRAM) 50 MG tablet Take 1 tablet (50 mg total) by mouth every 6 (six) hours as needed.   No facility-administered encounter medications on file as of 10/03/2021.    Pattricia Boss, [place] Pharmacist Assistant [phone]

## 2021-10-08 ENCOUNTER — Encounter: Payer: Self-pay | Admitting: Pulmonary Disease

## 2021-10-08 ENCOUNTER — Other Ambulatory Visit: Payer: Self-pay

## 2021-10-08 ENCOUNTER — Ambulatory Visit (INDEPENDENT_AMBULATORY_CARE_PROVIDER_SITE_OTHER): Payer: Medicare Other | Admitting: Pulmonary Disease

## 2021-10-08 VITALS — BP 122/62 | HR 88 | Temp 98.3°F | Ht 65.0 in | Wt 202.2 lb

## 2021-10-08 DIAGNOSIS — R942 Abnormal results of pulmonary function studies: Secondary | ICD-10-CM

## 2021-10-08 DIAGNOSIS — R911 Solitary pulmonary nodule: Secondary | ICD-10-CM

## 2021-10-08 DIAGNOSIS — J449 Chronic obstructive pulmonary disease, unspecified: Secondary | ICD-10-CM

## 2021-10-08 NOTE — Patient Instructions (Addendum)
Thank you for visiting Dr. Valeta Harms at [facility]. Today we recommend the following:  Orders Placed This Encounter  Procedures   Procedural/ Surgical Case Request: VIDEO BRONCHOSCOPY WITH ENDOBRONCHIAL NAVIGATION   CT Super D Chest Wo Contrast   Ambulatory referral to Pulmonology   Bronchoscopy planned for 10/23/2021  Return in about 3 weeks (around 10/29/2021) for with APP or Dr. Valeta Harms.    Please do your part to reduce the spread of COVID-19.

## 2021-10-08 NOTE — Progress Notes (Signed)
Synopsis: Referred in oct 2022 for lung nodule by [name], MD  Subjective:   PATIENT ID: [name] GENDER: female DOB: Feb 15, 1946, MRN: [id number]  Chief Complaint  Patient presents with   Consult    Pt. Says she has something on her lung.    This is a 75 year old female, past medical history of COPD, diabetes, severe COPD on previous pulmonary function test which was office spirometry in 2018.  She has hypertension as well.  Patient had a HRCT scan of the chest in August which revealed a right upper lobe pulmonary nodule.  Patient was taken for a nuclear medicine PET scan which revealed hypermetabolic uptake within this lesion concerning for a primary lung malignancy.  Patient met with Dr. [name] and discussed referral for radiation oncology versus surgical resection.  Patient was very concerned about having a surgical resection also wanted without she had a malignancy.  Patient was referred here to discuss lung nodule and consideration for biopsy.  We reviewed patient CT imaging today in the office.   Past Medical History:  Diagnosis Date   COPD (chronic obstructive pulmonary disease) (HCC)    Diabetes ([place])    High cholesterol    Hypertension    Pneumonia 09/10/2017     Family History  Problem Relation Age of Onset   Heart murmur Mother    Heart attack Father    Diabetes Father    Diabetes Brother      Past Surgical History:  Procedure Laterality Date   ABDOMINAL HYSTERECTOMY  1995   carpel tunnel surgery  2006   COLONOSCOPY  2014   COLOSTOMY  2007   colostomy let down  2008   endoscopy  2014   rotator cuff tear Left 04/14/2020    Social History   Socioeconomic History   Marital status: Married    Spouse name: Not on file   Number of children: Not on file   Years of education: Not on file   Highest education level: Not on file  Occupational History   Occupation: Retired    Comment: Former Advertising copywriter to Scientist, physiological of education  Tobacco Use    Smoking status: Former    Packs/day: 0.50    Years: 42.00    Pack years: 21.00    Types: Cigarettes    Start date: 01/30/1967    Quit date: 01/30/2006    Years since quitting: 15.6   Smokeless tobacco: Never   Tobacco comments:    intermittent cycles b/w 1/4-1/2ppd  Vaping Use   Vaping Use: Never used  Substance and Sexual Activity   Alcohol use: No   Drug use: No   Sexual activity: Not Currently  Other Topics Concern   Not on file  Social History Narrative   Not on file   Social Determinants of Health   Financial Resource Strain: Low Risk    Difficulty of Paying Living Expenses: Not hard at all  Food Insecurity: No Food Insecurity   Worried About Charity fundraiser in the Last Year: Never true   Ran Out of Food in the Last Year: Never true  Transportation Needs: No Transportation Needs   Lack of Transportation (Medical): No   Lack of Transportation (Non-Medical): No  Physical Activity: Inactive   Days of Exercise per Week: 0 days   Minutes of Exercise per Session: 0 min  Stress: No Stress Concern Present   Feeling of Stress : Not at all  Social Connections: Socially Integrated   Frequency  of Communication with Friends and Family: More than three times a week   Frequency of Social Gatherings with Friends and Family: More than three times a week   Attends Religious Services: More than 4 times per year   Active Member of [employer] or Organizations: Yes   Attends Music therapist: More than 4 times per year   Marital Status: Married  Human resources officer Violence: Not on file     Allergies  Allergen Reactions   Other Itching    HAIR DYE     Outpatient Medications Prior to Visit  Medication Sig Dispense Refill   Accu-Chek FastClix Lancets MISC Inject 1 each as directed daily. Use as directed to check blood sugars daily dx: e11.22 50 each 11   albuterol (VENTOLIN HFA) 108 (90 Base) MCG/ACT inhaler Inhale 2 puffs into the lungs every 4 (four) hours as needed for  wheezing or shortness of breath. 18 g 12   aspirin 81 MG tablet Take 81 mg by mouth daily.     Biotin 10000 MCG TBDP Take by mouth.     bisoprolol (ZEBETA) 5 MG tablet TAKE 1/2 TABLET(2.5 MG) BY MOUTH DAILY 45 tablet 3   Budeson-Glycopyrrol-Formoterol (BREZTRI AEROSPHERE) 160-9-4.8 MCG/ACT AERO Inhale 2 puffs into the lungs 2 (two) times daily. 10.7 g 6   CALCIUM-MAGNESIUM-ZINC PO Take 1 capsule by mouth daily.     Cholecalciferol (VITAMIN D3) 2000 UNITS TABS Take 2,000 Units by mouth daily.     famotidine (PEPCID) 20 MG tablet One after supper 90 tablet 3   gabapentin (NEURONTIN) 300 MG capsule Take 1 capsule (300 mg total) by mouth 4 (four) times daily. 120 capsule 5   glucose blood (ACCU-CHEK GUIDE) test strip 1 each by Other route daily. Use as instructed to check blood sugars 1 time per day dx: e11.22 50 each 11   ipratropium-albuterol (DUONEB) 0.5-2.5 (3) MG/3ML SOLN USE 3 ML VIA NEBULIZER EVERY 6 HOURS AS NEEDED 180 mL 1   montelukast (SINGULAIR) 10 MG tablet TAKE 1 TABLET(10 MG) BY MOUTH AT BEDTIME 90 tablet 2   pantoprazole (PROTONIX) 40 MG tablet TAKE 1 TABLET BY MOUTH 30 MINS BEFORE FIRST MEAL OF THE DAY 90 tablet 2   predniSONE (DELTASONE) 10 MG tablet Take  4 each am x 2 days,   2 each am x 2 days,  1 each am x 2 days and stop 14 tablet 5   rosuvastatin (CRESTOR) 20 MG tablet TAKE 1 TABLET(20 MG) BY MOUTH AT BEDTIME 90 tablet 1   Semaglutide, 1 MG/DOSE, (OZEMPIC, 1 MG/DOSE,) 4 MG/3ML SOPN Inject 1 mg into the skin once a week. 3 mL 3   traMADol (ULTRAM) 50 MG tablet Take 1 tablet (50 mg total) by mouth every 6 (six) hours as needed. 40 tablet 0   No facility-administered medications prior to visit.    Review of Systems  Constitutional:  Negative for chills, fever, malaise/fatigue and weight loss.  HENT:  Negative for hearing loss, sore throat and tinnitus.   Eyes:  Negative for blurred vision and double vision.  Respiratory:  Negative for cough, hemoptysis, sputum production,  shortness of breath, wheezing and stridor.   Cardiovascular:  Negative for chest pain, palpitations, orthopnea, leg swelling and PND.  Gastrointestinal:  Negative for abdominal pain, constipation, diarrhea, heartburn, nausea and vomiting.  Genitourinary:  Negative for dysuria, hematuria and urgency.  Musculoskeletal:  Negative for joint pain and myalgias.  Skin:  Negative for itching and rash.  Neurological:  Negative for  dizziness, tingling, weakness and headaches.  Endo/Heme/Allergies:  Negative for environmental allergies. Does not bruise/bleed easily.  Psychiatric/Behavioral:  Negative for depression. The patient is not nervous/anxious and does not have insomnia.   All other systems reviewed and are negative.   Objective:  Physical Exam Vitals reviewed.  Constitutional:      General: She is not in acute distress.    Appearance: She is well-developed.  HENT:     Head: Normocephalic and atraumatic.  Eyes:     General: No scleral icterus.    Conjunctiva/sclera: Conjunctivae normal.     Pupils: Pupils are equal, round, and reactive to light.  Neck:     Vascular: No JVD.     Trachea: No tracheal deviation.  Cardiovascular:     Rate and Rhythm: Normal rate and regular rhythm.     Heart sounds: Normal heart sounds. No murmur heard. Pulmonary:     Effort: Pulmonary effort is normal. No tachypnea, accessory muscle usage or respiratory distress.     Breath sounds: No stridor. No wheezing, rhonchi or rales.     Comments: Diminished breath sounds bilaterally Abdominal:     General: Bowel sounds are normal. There is no distension.     Palpations: Abdomen is soft.     Tenderness: There is no abdominal tenderness.  Musculoskeletal:        General: No tenderness.     Cervical back: Neck supple.  Lymphadenopathy:     Cervical: No cervical adenopathy.  Skin:    General: Skin is warm and dry.     Capillary Refill: Capillary refill takes less than 2 seconds.     Findings: No rash.   Neurological:     Mental Status: She is alert and oriented to person, place, and time.  Psychiatric:        Behavior: Behavior normal.     Vitals:   10/08/21 1537  BP: 122/62  Pulse: 88  Temp: 98.3 F (36.8 C)  TempSrc: Oral  SpO2: 95%  Weight: 202 lb 3.2 oz (91.7 kg)  Height: 5' 5" (1.651 m)   95% on  RA BMI Readings from Last 3 Encounters:  10/08/21 33.65 kg/m  09/13/21 31.89 kg/m  09/12/21 33.22 kg/m   Wt Readings from Last 3 Encounters:  10/08/21 202 lb 3.2 oz (91.7 kg)  09/13/21 197 lb 9.6 oz (89.6 kg)  09/12/21 198 lb 6.6 oz (90 kg)     CBC    Component Value Date/Time   WBC 6.0 10/16/2020 1101   RBC 4.59 10/16/2020 1101   HGB 13.8 10/16/2020 1101   HGB 14.2 09/06/2020 1425   HCT 41.2 10/16/2020 1101   HCT 45.0 09/06/2020 1425   PLT 399.0 10/16/2020 1101   PLT 373 09/06/2020 1425   MCV 89.7 10/16/2020 1101   MCV 92 09/06/2020 1425   MCH 29.0 09/06/2020 1425   MCH 29.0 09/13/2017 0220   MCHC 33.5 10/16/2020 1101   RDW 13.9 10/16/2020 1101   RDW 13.7 09/06/2020 1425   LYMPHSABS 2.3 10/16/2020 1101   MONOABS 0.5 10/16/2020 1101   EOSABS 0.1 10/16/2020 1101   BASOSABS 0.1 10/16/2020 1101    Chest Imaging: 08/23/2021 CT chest: Right upper lobe pulmonary nodule concerning for primary lung malignancy The patient's images have been independently reviewed by me.  '  09/11/2021 nuclear medicine PET scan: Hypermetabolic activity within the right upper lobe pulmonary nodule concerning for malignancy.The patient's images have been independently reviewed by me.    Pulmonary Functions Testing Results:  PFT Results Latest Ref Rng & Units 10/02/2016 03/17/2014  FVC-Pre L 1.32 1.88  FVC-Predicted Pre % 50 69  FVC-Post L - 1.93  FVC-Predicted Post % - 71  Pre FEV1/FVC % % 53 66  Post FEV1/FCV % % - 63  FEV1-Pre L 0.70 1.23  FEV1-Predicted Pre % 34 58  FEV1-Post L - 1.23  DLCO uncorrected ml/min/mmHg 6.18 15.24  DLCO UNC% % 22 56  DLCO corrected  ml/min/mmHg 6.22 -  DLCO COR %Predicted % 23 -  DLVA Predicted % 50 77  TLC L - 4.57  TLC % Predicted % - 85  RV % Predicted % - 119    FeNO:   Pathology:   Echocardiogram:   Heart Catheterization:     Assessment & Plan:     ICD-10-CM   1. Lung nodule  R91.1 Procedural/ Surgical Case Request: VIDEO BRONCHOSCOPY WITH ENDOBRONCHIAL NAVIGATION    Ambulatory referral to Pulmonology    CT Super D Chest Wo Contrast    2. COPD GOLD II   J44.9     3. Solitary pulmonary nodule on lung CT  R91.1     4. Abnormal PET of right lung  R94.2       Discussion:  This is a 75 year old female, right upper lobe pulmonary nodule, Gold stage II COPD.  Has a hypermetabolic uptake within the nodule likely has a primary lung malignancy.  We discussed all of the various options today to include single anesthetic event for tissue biopsy and resection as well as consideration for direct resection versus tissue biopsy fiducial placement and referral to radiation oncology.  Patient is very anxious about having a diagnosis of malignancy.  She would like to know what this is before coming up with a plan.  I encouraged her to have consideration for resection.  She is still not sure that she would want to undergo resection for the nodule.  Plan: We discussed today in the office risk-benefit and alternatives of proceeding with bronchoscopy. Patient is agreeable to proceed with video bronchoscopy and tissue biopsy. We will tentatively plan on 10/23/2021 for robotic assisted bronchoscopy tissue sampling and fiducial placement. Orders have been placed.  We appreciate additional time today by our [facility] and office staff working on coordination and scheduling of the procedure as described above.    Current Outpatient Medications:    Accu-Chek FastClix Lancets MISC, Inject 1 each as directed daily. Use as directed to check blood sugars daily dx: e11.22, Disp: 50 each, Rfl: 11   albuterol (VENTOLIN HFA) 108 (90  Base) MCG/ACT inhaler, Inhale 2 puffs into the lungs every 4 (four) hours as needed for wheezing or shortness of breath., Disp: 18 g, Rfl: 12   aspirin 81 MG tablet, Take 81 mg by mouth daily., Disp: , Rfl:    Biotin 10000 MCG TBDP, Take by mouth., Disp: , Rfl:    bisoprolol (ZEBETA) 5 MG tablet, TAKE 1/2 TABLET(2.5 MG) BY MOUTH DAILY, Disp: 45 tablet, Rfl: 3   Budeson-Glycopyrrol-Formoterol (BREZTRI AEROSPHERE) 160-9-4.8 MCG/ACT AERO, Inhale 2 puffs into the lungs 2 (two) times daily., Disp: 10.7 g, Rfl: 6   CALCIUM-MAGNESIUM-ZINC PO, Take 1 capsule by mouth daily., Disp: , Rfl:    Cholecalciferol (VITAMIN D3) 2000 UNITS TABS, Take 2,000 Units by mouth daily., Disp: , Rfl:    famotidine (PEPCID) 20 MG tablet, One after supper, Disp: 90 tablet, Rfl: 3   gabapentin (NEURONTIN) 300 MG capsule, Take 1 capsule (300 mg total) by mouth 4 (four) times  daily., Disp: 120 capsule, Rfl: 5   glucose blood (ACCU-CHEK GUIDE) test strip, 1 each by Other route daily. Use as instructed to check blood sugars 1 time per day dx: e11.22, Disp: 50 each, Rfl: 11   ipratropium-albuterol (DUONEB) 0.5-2.5 (3) MG/3ML SOLN, USE 3 ML VIA NEBULIZER EVERY 6 HOURS AS NEEDED, Disp: 180 mL, Rfl: 1   montelukast (SINGULAIR) 10 MG tablet, TAKE 1 TABLET(10 MG) BY MOUTH AT BEDTIME, Disp: 90 tablet, Rfl: 2   pantoprazole (PROTONIX) 40 MG tablet, TAKE 1 TABLET BY MOUTH 30 MINS BEFORE FIRST MEAL OF THE DAY, Disp: 90 tablet, Rfl: 2   predniSONE (DELTASONE) 10 MG tablet, Take  4 each am x 2 days,   2 each am x 2 days,  1 each am x 2 days and stop, Disp: 14 tablet, Rfl: 5   rosuvastatin (CRESTOR) 20 MG tablet, TAKE 1 TABLET(20 MG) BY MOUTH AT BEDTIME, Disp: 90 tablet, Rfl: 1   Semaglutide, 1 MG/DOSE, (OZEMPIC, 1 MG/DOSE,) 4 MG/3ML SOPN, Inject 1 mg into the skin once a week., Disp: 3 mL, Rfl: 3   traMADol (ULTRAM) 50 MG tablet, Take 1 tablet (50 mg total) by mouth every 6 (six) hours as needed., Disp: 40 tablet, Rfl: 0  I spent 62 minutes  dedicated to the care of this patient on the date of this encounter to include pre-visit review of records, face-to-face time with the patient discussing conditions above, post visit ordering of testing, clinical documentation with the electronic health record, making appropriate referrals as documented, and communicating necessary findings to members of the patients care team.   Garner Nash, DO [facility] 10/08/2021 5:39 PM

## 2021-10-08 NOTE — H&P (View-Only) (Signed)
Synopsis: Referred in oct 2022 for lung nodule by [name], MD  Subjective:   PATIENT ID: [name] GENDER: female DOB: Feb 15, 1946, MRN: [id number]  Chief Complaint  Patient presents with   Consult    Pt. Says she has something on her lung.    This is a 75 year old female, past medical history of COPD, diabetes, severe COPD on previous pulmonary function test which was office spirometry in 2018.  She has hypertension as well.  Patient had a HRCT scan of the chest in August which revealed a right upper lobe pulmonary nodule.  Patient was taken for a nuclear medicine PET scan which revealed hypermetabolic uptake within this lesion concerning for a primary lung malignancy.  Patient met with Dr. [name] and discussed referral for radiation oncology versus surgical resection.  Patient was very concerned about having a surgical resection also wanted without she had a malignancy.  Patient was referred here to discuss lung nodule and consideration for biopsy.  We reviewed patient CT imaging today in the office.   Past Medical History:  Diagnosis Date   COPD (chronic obstructive pulmonary disease) (HCC)    Diabetes ([place])    High cholesterol    Hypertension    Pneumonia 09/10/2017     Family History  Problem Relation Age of Onset   Heart murmur Mother    Heart attack Father    Diabetes Father    Diabetes Brother      Past Surgical History:  Procedure Laterality Date   ABDOMINAL HYSTERECTOMY  1995   carpel tunnel surgery  2006   COLONOSCOPY  2014   COLOSTOMY  2007   colostomy let down  2008   endoscopy  2014   rotator cuff tear Left 04/14/2020    Social History   Socioeconomic History   Marital status: Married    Spouse name: Not on file   Number of children: Not on file   Years of education: Not on file   Highest education level: Not on file  Occupational History   Occupation: Retired    Comment: Former Advertising copywriter to Scientist, physiological of education  Tobacco Use    Smoking status: Former    Packs/day: 0.50    Years: 42.00    Pack years: 21.00    Types: Cigarettes    Start date: 01/30/1967    Quit date: 01/30/2006    Years since quitting: 15.6   Smokeless tobacco: Never   Tobacco comments:    intermittent cycles b/w 1/4-1/2ppd  Vaping Use   Vaping Use: Never used  Substance and Sexual Activity   Alcohol use: No   Drug use: No   Sexual activity: Not Currently  Other Topics Concern   Not on file  Social History Narrative   Not on file   Social Determinants of Health   Financial Resource Strain: Low Risk    Difficulty of Paying Living Expenses: Not hard at all  Food Insecurity: No Food Insecurity   Worried About Charity fundraiser in the Last Year: Never true   Ran Out of Food in the Last Year: Never true  Transportation Needs: No Transportation Needs   Lack of Transportation (Medical): No   Lack of Transportation (Non-Medical): No  Physical Activity: Inactive   Days of Exercise per Week: 0 days   Minutes of Exercise per Session: 0 min  Stress: No Stress Concern Present   Feeling of Stress : Not at all  Social Connections: Socially Integrated   Frequency  of Communication with Friends and Family: More than three times a week   Frequency of Social Gatherings with Friends and Family: More than three times a week   Attends Religious Services: More than 4 times per year   Active Member of [employer] or Organizations: Yes   Attends Music therapist: More than 4 times per year   Marital Status: Married  Human resources officer Violence: Not on file     Allergies  Allergen Reactions   Other Itching    HAIR DYE     Outpatient Medications Prior to Visit  Medication Sig Dispense Refill   Accu-Chek FastClix Lancets MISC Inject 1 each as directed daily. Use as directed to check blood sugars daily dx: e11.22 50 each 11   albuterol (VENTOLIN HFA) 108 (90 Base) MCG/ACT inhaler Inhale 2 puffs into the lungs every 4 (four) hours as needed for  wheezing or shortness of breath. 18 g 12   aspirin 81 MG tablet Take 81 mg by mouth daily.     Biotin 10000 MCG TBDP Take by mouth.     bisoprolol (ZEBETA) 5 MG tablet TAKE 1/2 TABLET(2.5 MG) BY MOUTH DAILY 45 tablet 3   Budeson-Glycopyrrol-Formoterol (BREZTRI AEROSPHERE) 160-9-4.8 MCG/ACT AERO Inhale 2 puffs into the lungs 2 (two) times daily. 10.7 g 6   CALCIUM-MAGNESIUM-ZINC PO Take 1 capsule by mouth daily.     Cholecalciferol (VITAMIN D3) 2000 UNITS TABS Take 2,000 Units by mouth daily.     famotidine (PEPCID) 20 MG tablet One after supper 90 tablet 3   gabapentin (NEURONTIN) 300 MG capsule Take 1 capsule (300 mg total) by mouth 4 (four) times daily. 120 capsule 5   glucose blood (ACCU-CHEK GUIDE) test strip 1 each by Other route daily. Use as instructed to check blood sugars 1 time per day dx: e11.22 50 each 11   ipratropium-albuterol (DUONEB) 0.5-2.5 (3) MG/3ML SOLN USE 3 ML VIA NEBULIZER EVERY 6 HOURS AS NEEDED 180 mL 1   montelukast (SINGULAIR) 10 MG tablet TAKE 1 TABLET(10 MG) BY MOUTH AT BEDTIME 90 tablet 2   pantoprazole (PROTONIX) 40 MG tablet TAKE 1 TABLET BY MOUTH 30 MINS BEFORE FIRST MEAL OF THE DAY 90 tablet 2   predniSONE (DELTASONE) 10 MG tablet Take  4 each am x 2 days,   2 each am x 2 days,  1 each am x 2 days and stop 14 tablet 5   rosuvastatin (CRESTOR) 20 MG tablet TAKE 1 TABLET(20 MG) BY MOUTH AT BEDTIME 90 tablet 1   Semaglutide, 1 MG/DOSE, (OZEMPIC, 1 MG/DOSE,) 4 MG/3ML SOPN Inject 1 mg into the skin once a week. 3 mL 3   traMADol (ULTRAM) 50 MG tablet Take 1 tablet (50 mg total) by mouth every 6 (six) hours as needed. 40 tablet 0   No facility-administered medications prior to visit.    Review of Systems  Constitutional:  Negative for chills, fever, malaise/fatigue and weight loss.  HENT:  Negative for hearing loss, sore throat and tinnitus.   Eyes:  Negative for blurred vision and double vision.  Respiratory:  Negative for cough, hemoptysis, sputum production,  shortness of breath, wheezing and stridor.   Cardiovascular:  Negative for chest pain, palpitations, orthopnea, leg swelling and PND.  Gastrointestinal:  Negative for abdominal pain, constipation, diarrhea, heartburn, nausea and vomiting.  Genitourinary:  Negative for dysuria, hematuria and urgency.  Musculoskeletal:  Negative for joint pain and myalgias.  Skin:  Negative for itching and rash.  Neurological:  Negative for  dizziness, tingling, weakness and headaches.  Endo/Heme/Allergies:  Negative for environmental allergies. Does not bruise/bleed easily.  Psychiatric/Behavioral:  Negative for depression. The patient is not nervous/anxious and does not have insomnia.   All other systems reviewed and are negative.   Objective:  Physical Exam Vitals reviewed.  Constitutional:      General: She is not in acute distress.    Appearance: She is well-developed.  HENT:     Head: Normocephalic and atraumatic.  Eyes:     General: No scleral icterus.    Conjunctiva/sclera: Conjunctivae normal.     Pupils: Pupils are equal, round, and reactive to light.  Neck:     Vascular: No JVD.     Trachea: No tracheal deviation.  Cardiovascular:     Rate and Rhythm: Normal rate and regular rhythm.     Heart sounds: Normal heart sounds. No murmur heard. Pulmonary:     Effort: Pulmonary effort is normal. No tachypnea, accessory muscle usage or respiratory distress.     Breath sounds: No stridor. No wheezing, rhonchi or rales.     Comments: Diminished breath sounds bilaterally Abdominal:     General: Bowel sounds are normal. There is no distension.     Palpations: Abdomen is soft.     Tenderness: There is no abdominal tenderness.  Musculoskeletal:        General: No tenderness.     Cervical back: Neck supple.  Lymphadenopathy:     Cervical: No cervical adenopathy.  Skin:    General: Skin is warm and dry.     Capillary Refill: Capillary refill takes less than 2 seconds.     Findings: No rash.   Neurological:     Mental Status: She is alert and oriented to person, place, and time.  Psychiatric:        Behavior: Behavior normal.     Vitals:   10/08/21 1537  BP: 122/62  Pulse: 88  Temp: 98.3 F (36.8 C)  TempSrc: Oral  SpO2: 95%  Weight: 202 lb 3.2 oz (91.7 kg)  Height: 5' 5" (1.651 m)   95% on  RA BMI Readings from Last 3 Encounters:  10/08/21 33.65 kg/m  09/13/21 31.89 kg/m  09/12/21 33.22 kg/m   Wt Readings from Last 3 Encounters:  10/08/21 202 lb 3.2 oz (91.7 kg)  09/13/21 197 lb 9.6 oz (89.6 kg)  09/12/21 198 lb 6.6 oz (90 kg)     CBC    Component Value Date/Time   WBC 6.0 10/16/2020 1101   RBC 4.59 10/16/2020 1101   HGB 13.8 10/16/2020 1101   HGB 14.2 09/06/2020 1425   HCT 41.2 10/16/2020 1101   HCT 45.0 09/06/2020 1425   PLT 399.0 10/16/2020 1101   PLT 373 09/06/2020 1425   MCV 89.7 10/16/2020 1101   MCV 92 09/06/2020 1425   MCH 29.0 09/06/2020 1425   MCH 29.0 09/13/2017 0220   MCHC 33.5 10/16/2020 1101   RDW 13.9 10/16/2020 1101   RDW 13.7 09/06/2020 1425   LYMPHSABS 2.3 10/16/2020 1101   MONOABS 0.5 10/16/2020 1101   EOSABS 0.1 10/16/2020 1101   BASOSABS 0.1 10/16/2020 1101    Chest Imaging: 08/23/2021 CT chest: Right upper lobe pulmonary nodule concerning for primary lung malignancy The patient's images have been independently reviewed by me.  '  09/11/2021 nuclear medicine PET scan: Hypermetabolic activity within the right upper lobe pulmonary nodule concerning for malignancy.The patient's images have been independently reviewed by me.    Pulmonary Functions Testing Results:   PFT Results Latest Ref Rng & Units 10/02/2016 03/17/2014  FVC-Pre L 1.32 1.88  FVC-Predicted Pre % 50 69  FVC-Post L - 1.93  FVC-Predicted Post % - 71  Pre FEV1/FVC % % 53 66  Post FEV1/FCV % % - 63  FEV1-Pre L 0.70 1.23  FEV1-Predicted Pre % 34 58  FEV1-Post L - 1.23  DLCO uncorrected ml/min/mmHg 6.18 15.24  DLCO UNC% % 22 56  DLCO corrected  ml/min/mmHg 6.22 -  DLCO COR %Predicted % 23 -  DLVA Predicted % 50 77  TLC L - 4.57  TLC % Predicted % - 85  RV % Predicted % - 119    FeNO:   Pathology:   Echocardiogram:   Heart Catheterization:     Assessment & Plan:     ICD-10-CM   1. Lung nodule  R91.1 Procedural/ Surgical Case Request: VIDEO BRONCHOSCOPY WITH ENDOBRONCHIAL NAVIGATION    Ambulatory referral to Pulmonology    CT Super D Chest Wo Contrast    2. COPD GOLD II   J44.9     3. Solitary pulmonary nodule on lung CT  R91.1     4. Abnormal PET of right lung  R94.2       Discussion:  This is a 75-year-old female, right upper lobe pulmonary nodule, Gold stage II COPD.  Has a hypermetabolic uptake within the nodule likely has a primary lung malignancy.  We discussed all of the various options today to include single anesthetic event for tissue biopsy and resection as well as consideration for direct resection versus tissue biopsy fiducial placement and referral to radiation oncology.  Patient is very anxious about having a diagnosis of malignancy.  She would like to know what this is before coming up with a plan.  I encouraged her to have consideration for resection.  She is still not sure that she would want to undergo resection for the nodule.  Plan: We discussed today in the office risk-benefit and alternatives of proceeding with bronchoscopy. Patient is agreeable to proceed with video bronchoscopy and tissue biopsy. We will tentatively plan on 10/23/2021 for robotic assisted bronchoscopy tissue sampling and fiducial placement. Orders have been placed.  We appreciate additional time today by our PCC's and office staff working on coordination and scheduling of the procedure as described above.    Current Outpatient Medications:    Accu-Chek FastClix Lancets MISC, Inject 1 each as directed daily. Use as directed to check blood sugars daily dx: e11.22, Disp: 50 each, Rfl: 11   albuterol (VENTOLIN HFA) 108 (90  Base) MCG/ACT inhaler, Inhale 2 puffs into the lungs every 4 (four) hours as needed for wheezing or shortness of breath., Disp: 18 g, Rfl: 12   aspirin 81 MG tablet, Take 81 mg by mouth daily., Disp: , Rfl:    Biotin 10000 MCG TBDP, Take by mouth., Disp: , Rfl:    bisoprolol (ZEBETA) 5 MG tablet, TAKE 1/2 TABLET(2.5 MG) BY MOUTH DAILY, Disp: 45 tablet, Rfl: 3   Budeson-Glycopyrrol-Formoterol (BREZTRI AEROSPHERE) 160-9-4.8 MCG/ACT AERO, Inhale 2 puffs into the lungs 2 (two) times daily., Disp: 10.7 g, Rfl: 6   CALCIUM-MAGNESIUM-ZINC PO, Take 1 capsule by mouth daily., Disp: , Rfl:    Cholecalciferol (VITAMIN D3) 2000 UNITS TABS, Take 2,000 Units by mouth daily., Disp: , Rfl:    famotidine (PEPCID) 20 MG tablet, One after supper, Disp: 90 tablet, Rfl: 3   gabapentin (NEURONTIN) 300 MG capsule, Take 1 capsule (300 mg total) by mouth 4 (four) times   daily., Disp: 120 capsule, Rfl: 5   glucose blood (ACCU-CHEK GUIDE) test strip, 1 each by Other route daily. Use as instructed to check blood sugars 1 time per day dx: e11.22, Disp: 50 each, Rfl: 11   ipratropium-albuterol (DUONEB) 0.5-2.5 (3) MG/3ML SOLN, USE 3 ML VIA NEBULIZER EVERY 6 HOURS AS NEEDED, Disp: 180 mL, Rfl: 1   montelukast (SINGULAIR) 10 MG tablet, TAKE 1 TABLET(10 MG) BY MOUTH AT BEDTIME, Disp: 90 tablet, Rfl: 2   pantoprazole (PROTONIX) 40 MG tablet, TAKE 1 TABLET BY MOUTH 30 MINS BEFORE FIRST MEAL OF THE DAY, Disp: 90 tablet, Rfl: 2   predniSONE (DELTASONE) 10 MG tablet, Take  4 each am x 2 days,   2 each am x 2 days,  1 each am x 2 days and stop, Disp: 14 tablet, Rfl: 5   rosuvastatin (CRESTOR) 20 MG tablet, TAKE 1 TABLET(20 MG) BY MOUTH AT BEDTIME, Disp: 90 tablet, Rfl: 1   Semaglutide, 1 MG/DOSE, (OZEMPIC, 1 MG/DOSE,) 4 MG/3ML SOPN, Inject 1 mg into the skin once a week., Disp: 3 mL, Rfl: 3   traMADol (ULTRAM) 50 MG tablet, Take 1 tablet (50 mg total) by mouth every 6 (six) hours as needed., Disp: 40 tablet, Rfl: 0  I spent 62 minutes  dedicated to the care of this patient on the date of this encounter to include pre-visit review of records, face-to-face time with the patient discussing conditions above, post visit ordering of testing, clinical documentation with the electronic health record, making appropriate referrals as documented, and communicating necessary findings to members of the patients care team.   Garner Nash, DO [facility] 10/08/2021 5:39 PM

## 2021-10-09 ENCOUNTER — Telehealth: Payer: Self-pay | Admitting: Pulmonary Disease

## 2021-10-09 NOTE — Telephone Encounter (Signed)
Pt was scheduled for 10/25 at 8:45.  CT scheduled for 10/19 at [facility].  Pt will go for covid test on 10/21.  I spoke to pt & gave her all appt info.

## 2021-10-11 ENCOUNTER — Telehealth: Payer: Self-pay | Admitting: Pulmonary Disease

## 2021-10-12 NOTE — Telephone Encounter (Signed)
I have called and spoke with pt and she stated that she has lots of questions about the bronch that is scheduled for 10/25.  She stated that if BI could call her that it would be better that way.  She stated that her daughter is a PA and she takes lunch around 1 and they would like her for her to be able to be a part of the call as well.  BI please advise. thanks

## 2021-10-15 NOTE — Telephone Encounter (Signed)
Tammy Nash, DO  You; Magdalen Spatz, NP 3 minutes ago (4:36 PM)   Please let her know I am currently out of town and will call her when I get back next week. I am sorry.   Thanks,   BLI     Called and spoke with pt letting her know the info stated by Dr. Valeta Harms and she verbalized understanding.

## 2021-10-15 NOTE — Telephone Encounter (Signed)
Pt sent another mychart message stating that she is still anxiously waiting to hear from Dr. Valeta Harms about questions that her and her family have in regards to the upcoming bronch.  Routing back to Dr. Valeta Harms.

## 2021-10-17 ENCOUNTER — Other Ambulatory Visit: Payer: BC Managed Care – PPO

## 2021-10-18 ENCOUNTER — Telehealth: Payer: BC Managed Care – PPO

## 2021-10-18 ENCOUNTER — Telehealth: Payer: Medicare Other

## 2021-10-19 ENCOUNTER — Other Ambulatory Visit: Payer: Self-pay | Admitting: Pulmonary Disease

## 2021-10-19 ENCOUNTER — Encounter (HOSPITAL_COMMUNITY): Payer: Self-pay

## 2021-10-19 ENCOUNTER — Other Ambulatory Visit: Payer: Self-pay

## 2021-10-19 ENCOUNTER — Ambulatory Visit (HOSPITAL_COMMUNITY)
Admission: RE | Admit: 2021-10-19 | Discharge: 2021-10-19 | Disposition: A | Discharge status: Home / Self Care | Payer: Medicare Other | Source: Ambulatory Visit | Attending: Pulmonary Disease | Admitting: Pulmonary Disease

## 2021-10-19 DIAGNOSIS — I700 Atherosclerosis of aorta: Secondary | ICD-10-CM | POA: Diagnosis not present

## 2021-10-19 DIAGNOSIS — R911 Solitary pulmonary nodule: Secondary | ICD-10-CM | POA: Diagnosis not present

## 2021-10-19 DIAGNOSIS — J439 Emphysema, unspecified: Secondary | ICD-10-CM | POA: Diagnosis not present

## 2021-10-19 DIAGNOSIS — R0602 Shortness of breath: Secondary | ICD-10-CM | POA: Diagnosis not present

## 2021-10-19 LAB — SARS CORONAVIRUS 2 (TAT 6-24 HRS): SARS Coronavirus 2: NEGATIVE

## 2021-10-20 ENCOUNTER — Other Ambulatory Visit: Payer: Self-pay | Admitting: Internal Medicine

## 2021-10-22 ENCOUNTER — Other Ambulatory Visit: Payer: Self-pay

## 2021-10-22 ENCOUNTER — Encounter (HOSPITAL_COMMUNITY): Payer: Self-pay | Admitting: Pulmonary Disease

## 2021-10-22 NOTE — Anesthesia Preprocedure Evaluation (Addendum)
Anesthesia Evaluation  Patient identified by MRN, date of birth, ID band Patient awake    Reviewed: Allergy & Precautions, NPO status , Patient's Chart, lab work & pertinent test results, reviewed documented beta blocker date and time   History of Anesthesia Complications (+) PONV  Airway Mallampati: I  TM Distance: >3 FB Neck ROM: Full    Dental  (+) Edentulous Upper, Dental Advisory Given   Pulmonary asthma , COPD,  COPD inhaler, former smoker,     + wheezing      Cardiovascular hypertension, Pt. on home beta blockers Normal cardiovascular exam  Narrative 04/2021  The left ventricular ejection fraction is normal (55-65%).  Nuclear stress EF: 62%.  There was no ST segment deviation noted during stress.  The study is normal.  This is a low risk study.  Low risk stress nuclear study with normal perfusion and normal left  ventricular regional and global systolic function.   IMPRESSIONS    1. Left ventricular ejection fraction, by estimation, is 60 to 65%. The  left ventricle has normal function. The left ventricle has no regional  wall motion abnormalities. Left ventricular diastolic parameters are  consistent with Grade I diastolic  dysfunction (impaired relaxation).  2. Right ventricular systolic function is normal. The right ventricular  size is normal. There is normal pulmonary artery systolic pressure.  3. The mitral valve is normal in structure. No evidence of mitral valve  regurgitation. No evidence of mitral stenosis.  4. The aortic valve is tricuspid. Aortic valve regurgitation is not  visualized. Mild aortic valve sclerosis is present, with no evidence of  aortic valve stenosis.  5. The inferior vena cava is normal in size with greater than 50%  respiratory variability, suggesting right atrial pressure of 3 mmHg.    Neuro/Psych negative neurological ROS     GI/Hepatic negative GI ROS, Neg liver  ROS,   Endo/Other  diabetes  Renal/GU negative Renal ROS     Musculoskeletal negative musculoskeletal ROS (+)   Abdominal   Peds  Hematology negative hematology ROS (+)   Anesthesia Other Findings   Reproductive/Obstetrics                            Anesthesia Physical Anesthesia Plan  ASA: 3  Anesthesia Plan: General   Post-op Pain Management:    Induction: Intravenous  PONV Risk Score and Plan: 4 or greater and Ondansetron, Dexamethasone and Treatment may vary due to age or medical condition  Airway Management Planned: Oral ETT  Additional Equipment:   Intra-op Plan:   Post-operative Plan: Extubation in OR  Informed Consent: I have reviewed the patients History and Physical, chart, labs and discussed the procedure including the risks, benefits and alternatives for the proposed anesthesia with the patient or authorized representative who has indicated his/her understanding and acceptance.     Dental advisory given  Plan Discussed with: Anesthesiologist and CRNA  Anesthesia Plan Comments:        Anesthesia Quick Evaluation

## 2021-10-22 NOTE — Progress Notes (Addendum)
[name], denies chest painor shortness of breath, patient reports that she wheezes, I instructed patient [name] to use Brezti in am and use Duoneb, bring Albuterol inhaler to the hospital with you.it with you.  [name] has type II diabetes, patient does not check CBG very often. I instructed patient to check CBG after awaking and every 2 hours until arrival  to the hospital.  I Instructed patient if CBG is less than 70 to take 4 Glucose Tablets or 1 tube of Glucose Gel or 1/2 cup of a clear juice. Recheck CBG in 15 minutes if CBG is not over 70 call, pre- op desk at [phone] for further instructions.  Patient reported that she will , if she can find her meter.  PCP is Dr. Gertie Exon. Pulmonologist is Dr [name].  I instructed patient to shower with antibiotic soap, if it is available.  Dry off with a clean towel. Do not put lotion, powder, cologne or deodorant or makeup.No jewelry or piercings. Men may shave their face and neck. Woman should not shave. No nail polish, artificial or acrylic nails. Wear clean clothes, brush your teeth. Glasses, contact lens,dentures or partials may not be worn in the OR. If you need to wear them, please bring a case for glasses, do not wear contacts or bring a case, the hospital does not have contact cases, dentures or partials will have to be removed , make sure they are clean, we will provide a denture cup to put them in. You will need some one to drive you home and a responsible person over the age of 20 to stay with you for the first 24 hours after surgery.

## 2021-10-23 ENCOUNTER — Ambulatory Visit (HOSPITAL_COMMUNITY)
Admission: RE | Admit: 2021-10-23 | Discharge: 2021-10-23 | Disposition: A | Discharge status: Home / Self Care | Payer: Medicare Other | Attending: Pulmonary Disease | Admitting: Pulmonary Disease

## 2021-10-23 ENCOUNTER — Ambulatory Visit (HOSPITAL_COMMUNITY): Payer: Medicare Other | Admitting: Anesthesiology

## 2021-10-23 ENCOUNTER — Ambulatory Visit (HOSPITAL_COMMUNITY): Payer: Medicare Other

## 2021-10-23 ENCOUNTER — Encounter (HOSPITAL_COMMUNITY)
Admission: RE | Disposition: A | Discharge status: Home / Self Care | Payer: Self-pay | Source: Home / Self Care | Attending: Pulmonary Disease

## 2021-10-23 ENCOUNTER — Encounter (HOSPITAL_COMMUNITY): Payer: Self-pay | Admitting: Pulmonary Disease

## 2021-10-23 DIAGNOSIS — Z79899 Other long term (current) drug therapy: Secondary | ICD-10-CM | POA: Diagnosis not present

## 2021-10-23 DIAGNOSIS — Z7951 Long term (current) use of inhaled steroids: Secondary | ICD-10-CM | POA: Insufficient documentation

## 2021-10-23 DIAGNOSIS — C3411 Malignant neoplasm of upper lobe, right bronchus or lung: Secondary | ICD-10-CM | POA: Insufficient documentation

## 2021-10-23 DIAGNOSIS — Z87891 Personal history of nicotine dependence: Secondary | ICD-10-CM | POA: Diagnosis not present

## 2021-10-23 DIAGNOSIS — Z833 Family history of diabetes mellitus: Secondary | ICD-10-CM | POA: Diagnosis not present

## 2021-10-23 DIAGNOSIS — E119 Type 2 diabetes mellitus without complications: Secondary | ICD-10-CM | POA: Diagnosis not present

## 2021-10-23 DIAGNOSIS — I700 Atherosclerosis of aorta: Secondary | ICD-10-CM | POA: Diagnosis not present

## 2021-10-23 DIAGNOSIS — R911 Solitary pulmonary nodule: Secondary | ICD-10-CM | POA: Insufficient documentation

## 2021-10-23 DIAGNOSIS — E7800 Pure hypercholesterolemia, unspecified: Secondary | ICD-10-CM | POA: Diagnosis not present

## 2021-10-23 DIAGNOSIS — J449 Chronic obstructive pulmonary disease, unspecified: Secondary | ICD-10-CM | POA: Insufficient documentation

## 2021-10-23 DIAGNOSIS — Z9109 Other allergy status, other than to drugs and biological substances: Secondary | ICD-10-CM | POA: Insufficient documentation

## 2021-10-23 DIAGNOSIS — I10 Essential (primary) hypertension: Secondary | ICD-10-CM | POA: Insufficient documentation

## 2021-10-23 DIAGNOSIS — Z8249 Family history of ischemic heart disease and other diseases of the circulatory system: Secondary | ICD-10-CM | POA: Insufficient documentation

## 2021-10-23 DIAGNOSIS — Z419 Encounter for procedure for purposes other than remedying health state, unspecified: Secondary | ICD-10-CM

## 2021-10-23 DIAGNOSIS — J441 Chronic obstructive pulmonary disease with (acute) exacerbation: Secondary | ICD-10-CM | POA: Diagnosis not present

## 2021-10-23 DIAGNOSIS — Z9889 Other specified postprocedural states: Secondary | ICD-10-CM

## 2021-10-23 HISTORY — DX: Unspecified asthma, uncomplicated: J45.909

## 2021-10-23 HISTORY — PX: VIDEO BRONCHOSCOPY WITH ENDOBRONCHIAL NAVIGATION: SHX6175

## 2021-10-23 HISTORY — PX: VIDEO BRONCHOSCOPY WITH RADIAL ENDOBRONCHIAL ULTRASOUND: SHX6849

## 2021-10-23 HISTORY — PX: FIDUCIAL MARKER PLACEMENT: SHX6858

## 2021-10-23 HISTORY — PX: BRONCHIAL BIOPSY: SHX5109

## 2021-10-23 HISTORY — PX: BRONCHIAL BRUSHINGS: SHX5108

## 2021-10-23 HISTORY — DX: Unspecified osteoarthritis, unspecified site: M19.90

## 2021-10-23 HISTORY — DX: Other specified postprocedural states: Z98.890

## 2021-10-23 HISTORY — DX: Other complications of anesthesia, initial encounter: T88.59XA

## 2021-10-23 HISTORY — PX: BRONCHIAL NEEDLE ASPIRATION BIOPSY: SHX5106

## 2021-10-23 HISTORY — DX: Nausea with vomiting, unspecified: R11.2

## 2021-10-23 HISTORY — DX: Nausea with vomiting, unspecified: Z98.890

## 2021-10-23 LAB — CBC
HCT: 39 % (ref 36.0–46.0)
Hemoglobin: 12.3 g/dL (ref 12.0–15.0)
MCH: 28.3 pg (ref 26.0–34.0)
MCHC: 31.5 g/dL (ref 30.0–36.0)
MCV: 89.9 fL (ref 80.0–100.0)
Platelets: 345 10*3/uL (ref 150–400)
RBC: 4.34 MIL/uL (ref 3.87–5.11)
RDW: 13.5 % (ref 11.5–15.5)
WBC: 5.1 10*3/uL (ref 4.0–10.5)
nRBC: 0 % (ref 0.0–0.2)

## 2021-10-23 LAB — BASIC METABOLIC PANEL
Anion gap: 9 (ref 5–15)
BUN: <5 mg/dL — ABNORMAL LOW (ref 8–23)
CO2: 28 mmol/L (ref 22–32)
Calcium: 9.2 mg/dL (ref 8.9–10.3)
Chloride: 102 mmol/L (ref 98–111)
Creatinine, Ser: 0.87 mg/dL (ref 0.44–1.00)
GFR, Estimated: >60 mL/min (ref >60)
Glucose, Bld: 152 mg/dL — ABNORMAL HIGH (ref 70–99)
Potassium: 4.2 mmol/L (ref 3.5–5.1)
Sodium: 139 mmol/L (ref 135–145)

## 2021-10-23 LAB — GLUCOSE, CAPILLARY
Glucose-Capillary: 163 mg/dL — ABNORMAL HIGH (ref 70–99)
Glucose-Capillary: 134 mg/dL — ABNORMAL HIGH (ref 70–99)

## 2021-10-23 SURGERY — VIDEO BRONCHOSCOPY WITH ENDOBRONCHIAL NAVIGATION
Anesthesia: General | Laterality: Right

## 2021-10-23 MED ORDER — ONDANSETRON HCL 4 MG/2ML IJ SOLN
INTRAMUSCULAR | Status: DC | PRN
  Administered 2021-10-23: 4 mg via INTRAVENOUS

## 2021-10-23 MED ORDER — ROCURONIUM BROMIDE 10 MG/ML (PF) SYRINGE
PREFILLED_SYRINGE | INTRAVENOUS | Status: DC | PRN
  Administered 2021-10-23: 100 mg via INTRAVENOUS

## 2021-10-23 MED ORDER — LACTATED RINGERS IV SOLN: INTRAVENOUS | Status: DC

## 2021-10-23 MED ORDER — PROPOFOL 10 MG/ML IV BOLUS
INTRAVENOUS | Status: DC | PRN
  Administered 2021-10-23: 150 mg via INTRAVENOUS

## 2021-10-23 MED ORDER — DEXAMETHASONE SODIUM PHOSPHATE 10 MG/ML IJ SOLN
INTRAMUSCULAR | Status: DC | PRN
  Administered 2021-10-23: 10 mg via INTRAVENOUS

## 2021-10-23 MED ORDER — MIDAZOLAM HCL 5 MG/5ML IJ SOLN
INTRAMUSCULAR | Status: DC | PRN
  Administered 2021-10-23: 1 mg via INTRAVENOUS

## 2021-10-23 MED ORDER — ALBUTEROL SULFATE (2.5 MG/3ML) 0.083% IN NEBU
2.5000 mg | INHALATION_SOLUTION | Freq: Once | RESPIRATORY_TRACT | Status: AC
  Administered 2021-10-23: 2.5 mg via RESPIRATORY_TRACT
  Filled 2021-10-23: qty 3

## 2021-10-23 MED ORDER — LIDOCAINE 2% (20 MG/ML) 5 ML SYRINGE
INTRAMUSCULAR | Status: DC | PRN
  Administered 2021-10-23: 100 mg via INTRAVENOUS

## 2021-10-23 MED ORDER — CHLORHEXIDINE GLUCONATE 0.12 % MT SOLN
OROMUCOSAL | Status: AC
  Administered 2021-10-23: 15 mL
  Filled 2021-10-23: qty 15

## 2021-10-23 MED ORDER — SUGAMMADEX SODIUM 200 MG/2ML IV SOLN
INTRAVENOUS | Status: DC | PRN
Start: 2021-10-23 — End: 2021-10-23
  Administered 2021-10-23: 400 mg via INTRAVENOUS

## 2021-10-23 MED ORDER — DEXMEDETOMIDINE (PRECEDEX) IN NS 20 MCG/5ML (4 MCG/ML) IV SYRINGE
PREFILLED_SYRINGE | INTRAVENOUS | Status: DC | PRN
  Administered 2021-10-23: 8 ug via INTRAVENOUS
  Administered 2021-10-23: 4 ug via INTRAVENOUS

## 2021-10-23 MED ORDER — PHENYLEPHRINE 40 MCG/ML (10ML) SYRINGE FOR IV PUSH (FOR BLOOD PRESSURE SUPPORT)
PREFILLED_SYRINGE | INTRAVENOUS | Status: DC | PRN
  Administered 2021-10-23: 40 ug via INTRAVENOUS

## 2021-10-23 MED ORDER — FENTANYL CITRATE (PF) 100 MCG/2ML IJ SOLN
INTRAMUSCULAR | Status: DC | PRN
  Administered 2021-10-23: 50 ug via INTRAVENOUS

## 2021-10-23 SURGICAL SUPPLY — 47 items

## 2021-10-23 NOTE — Discharge Instructions (Signed)
Flexible Bronchoscopy, Care After This sheet gives you information about how to care for yourself after your test. Your doctor may also give you more specific instructions. If you have problems or questions, contact your doctor. Follow these instructions at home: Eating and drinking Do not eat or drink anything (not even water) for 2 hours after your test, or until your numbing medicine (local anesthetic) wears off. When your numbness is gone and your cough and gag reflexes have come back, you may: Eat only soft foods. Slowly drink liquids. The day after the test, go back to your normal diet. Driving Do not drive for 24 hours if you were given a medicine to help you relax (sedative). Do not drive or use heavy machinery while taking prescription pain medicine. General instructions  Take over-the-counter and prescription medicines only as told by your doctor. Return to your normal activities as told. Ask what activities are safe for you. Do not use any products that have nicotine or tobacco in them. This includes cigarettes and e-cigarettes. If you need help quitting, ask your doctor. Keep all follow-up visits as told by your doctor. This is important. It is very important if you had a tissue sample (biopsy) taken. Get help right away if: You have shortness of breath that gets worse. You get light-headed. You feel like you are going to pass out (faint). You have chest pain. You cough up: More than a little blood. More blood than before. Summary Do not eat or drink anything (not even water) for 2 hours after your test, or until your numbing medicine wears off. Do not use cigarettes. Do not use e-cigarettes. Get help right away if you have chest pain.  This information is not intended to replace advice given to you by your health care provider. Make sure you discuss any questions you have with your health care provider. Document Released: 10/13/2009 Document Revised: 11/28/2017 Document  Reviewed: 01/03/2017 Elsevier Patient Education  2020 [employer].

## 2021-10-23 NOTE — Op Note (Signed)
Video Bronchoscopy with Robotic Assisted Bronchoscopic Navigation   Date of Operation: 10/23/2021   Pre-op Diagnosis: Lung nodule, right upper lobe  Post-op Diagnosis: Lung nodule, right upper lobe  Surgeon:  L , DO   Assistants: none   Anesthesia: General endotracheal anesthesia  Operation: Flexible video fiberoptic bronchoscopy with robotic assistance and biopsies.  Estimated Blood Loss: Minimal  Complications: None  Indications and History: [name] is a 75 y.o. female with history of right upper lobe pulmonary nodule. The risks, benefits, complications, treatment options and expected outcomes were discussed with the patient.  The possibilities of pneumothorax, pneumonia, reaction to medication, pulmonary aspiration, perforation of a viscus, bleeding, failure to diagnose a condition and creating a complication requiring transfusion or operation were discussed with the patient who freely signed the consent.    Description of Procedure: The patient was seen in the Preoperative Area, was examined and was deemed appropriate to proceed.  The patient was taken to [facility] endoscopy room 3, identified as [name] and the procedure verified as Flexible Video Fiberoptic Bronchoscopy.  A Time Out was held and the above information confirmed.   Prior to the date of the procedure a high-resolution CT scan of the chest was performed. Utilizing ION software program a virtual tracheobronchial tree was generated to allow the creation of distinct navigation pathways to the patient's parenchymal abnormalities. After being taken to the operating room general anesthesia was initiated and the patient  was orally intubated. The video fiberoptic bronchoscope was introduced via the endotracheal tube and a general inspection was performed which showed normal right and left lung anatomy, aspiration of the bilateral mainstems was completed to remove any remaining secretions. Robotic catheter  inserted into patient's endotracheal tube.   Target #1 right upper lobe: The distinct navigation pathways prepared prior to this procedure were then utilized to navigate to patient's lesion identified on CT scan. The robotic catheter was secured into place and the vision probe was withdrawn.  Lesion location was approximated using fluoroscopy and radial endobronchial ultrasound for peripheral targeting. Under fluoroscopic guidance transbronchial needle brushings, transbronchial needle biopsies, and transbronchial forceps biopsies were performed to be sent for cytology and pathology.  Following tissue sampling a single fiducial was placed under fluoroscopic guidance with a fiducial catheter wire and delivery kit.  At the end of the procedure a general airway inspection was performed and there was no evidence of active bleeding. The bronchoscope was removed.  The patient tolerated the procedure well. There was no significant blood loss and there were no obvious complications. A post-procedural chest x-ray is pending.  Samples Target #1: 1. Transbronchial needle brushings from RUL 2. Transbronchial Wang needle biopsies from RUL 3. Transbronchial forceps biopsies from RUL  Plans:  The patient will be discharged from the PACU to home when recovered from anesthesia and after chest x-ray is reviewed. We will review the cytology, pathology and microbiology results with the patient when they become available. Outpatient followup will be with [facility],  L , DO.   

## 2021-10-23 NOTE — Transfer of Care (Signed)
Immediate Anesthesia Transfer of Care Note  Patient: [name]  Procedure(s) Performed: VIDEO BRONCHOSCOPY WITH ENDOBRONCHIAL NAVIGATION (Right) VIDEO BRONCHOSCOPY WITH RADIAL ENDOBRONCHIAL ULTRASOUND BRONCHIAL BIOPSIES BRONCHIAL BRUSHINGS BRONCHIAL NEEDLE ASPIRATION BIOPSIES FIDUCIAL MARKER PLACEMENT  Patient Location: PACU  Anesthesia Type:General  Level of Consciousness: drowsy  Airway & Oxygen Therapy: Patient Spontanous Breathing and Patient connected to face mask oxygen  Post-op Assessment: Report given to RN and Post -op Vital signs reviewed and stable  Post vital signs: Reviewed and stable  Last Vitals:  Vitals Value Taken Time  BP 147/81 10/23/21 1040  Temp    Pulse 91 10/23/21 1041  Resp 28 10/23/21 1041  SpO2 100 % 10/23/21 1041  Vitals shown include unvalidated device data.  Last Pain:  Vitals:   10/23/21 0717  TempSrc:   PainSc: 0-No pain         Complications: No notable events documented.

## 2021-10-23 NOTE — Anesthesia Procedure Notes (Signed)
Procedure Name: Intubation Date/Time: 10/23/2021 9:47 AM Performed by: Vonna Drafts, CRNA Pre-anesthesia Checklist: Patient identified, Emergency Drugs available, Suction available and Patient being monitored Patient Re-evaluated:Patient Re-evaluated prior to induction Oxygen Delivery Method: Circle system utilized Preoxygenation: Pre-oxygenation with 100% oxygen Induction Type: IV induction Ventilation: Mask ventilation without difficulty Laryngoscope Size: Mac and 3 Grade View: Grade I Tube type: Oral Tube size: 8.5 mm Number of attempts: 1 Airway Equipment and Method: Stylet and Oral airway Placement Confirmation: ETT inserted through vocal cords under direct vision, positive ETCO2 and breath sounds checked- equal and bilateral Secured at: 24 cm Tube secured with: Tape Dental Injury: Teeth and Oropharynx as per pre-operative assessment

## 2021-10-23 NOTE — Anesthesia Postprocedure Evaluation (Signed)
Anesthesia Post Note  Patient: [name]  Procedure(s) Performed: VIDEO BRONCHOSCOPY WITH ENDOBRONCHIAL NAVIGATION (Right) VIDEO BRONCHOSCOPY WITH RADIAL ENDOBRONCHIAL ULTRASOUND BRONCHIAL BIOPSIES BRONCHIAL BRUSHINGS BRONCHIAL NEEDLE ASPIRATION BIOPSIES FIDUCIAL MARKER PLACEMENT     Patient location during evaluation: PACU Anesthesia Type: General Level of consciousness: sedated Pain management: pain level controlled Vital Signs Assessment: post-procedure vital signs reviewed and stable Respiratory status: spontaneous breathing and respiratory function stable Cardiovascular status: stable Postop Assessment: no apparent nausea or vomiting Anesthetic complications: no   No notable events documented.  Last Vitals:  Vitals:   10/23/21 1055 10/23/21 1110  BP: (!) 125/92 129/65  Pulse: 99 92  Resp: (!) 22 16  Temp:  36.6 C  SpO2: 96% 92%    Last Pain:  Vitals:   10/23/21 1110  TempSrc:   PainSc: 0-No pain                 [place] Violet DANIEL

## 2021-10-23 NOTE — Interval H&P Note (Signed)
History and Physical Interval Note:  10/23/2021 9:31 AM  [name]  has presented today for surgery, with the diagnosis of lung nodule.  The various methods of treatment have been discussed with the patient and family. After consideration of risks, benefits and other options for treatment, the patient has consented to  Procedure(s) with comments: [place] (Right) - ION, w/ fiducial placement as a surgical intervention.  The patient's history has been reviewed, patient examined, no change in status, stable for surgery.  I have reviewed the patient's chart and labs.  Questions were answered to the patient's satisfaction.     [place]

## 2021-10-24 ENCOUNTER — Encounter: Payer: Self-pay | Admitting: Internal Medicine

## 2021-10-26 ENCOUNTER — Telehealth: Payer: Self-pay | Admitting: Pulmonary Disease

## 2021-10-26 LAB — CYTOLOGY - NON PAP

## 2021-10-26 NOTE — Telephone Encounter (Signed)
PCCM:  I tried calling patient regarding her pathology results from this week.  She has an appointment with Eric Form on Monday to discuss results. 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  She would likely need referral to radiation oncology if she is not interested in surgery.  We also discussed referral to Dr. Kipp Brood or Roxan Hockey for consideration of resection however she was hesitant at last office visit for referral for surgery.  Garner Nash, DO [facility] 10/26/2021 5:26 PM

## 2021-10-29 ENCOUNTER — Telehealth: Payer: Self-pay

## 2021-10-29 ENCOUNTER — Other Ambulatory Visit: Payer: Self-pay

## 2021-10-29 ENCOUNTER — Telehealth: Payer: Self-pay | Admitting: Acute Care

## 2021-10-29 ENCOUNTER — Encounter: Payer: Self-pay | Admitting: Acute Care

## 2021-10-29 ENCOUNTER — Ambulatory Visit (INDEPENDENT_AMBULATORY_CARE_PROVIDER_SITE_OTHER): Payer: Medicare Other | Admitting: Acute Care

## 2021-10-29 VITALS — BP 140/82 | HR 93 | Temp 98.0°F | Ht 65.0 in | Wt 202.0 lb

## 2021-10-29 DIAGNOSIS — I2510 Atherosclerotic heart disease of native coronary artery without angina pectoris: Secondary | ICD-10-CM

## 2021-10-29 DIAGNOSIS — C3491 Malignant neoplasm of unspecified part of right bronchus or lung: Secondary | ICD-10-CM

## 2021-10-29 DIAGNOSIS — R911 Solitary pulmonary nodule: Secondary | ICD-10-CM

## 2021-10-29 NOTE — Telephone Encounter (Signed)
Please let patient know Dr. Sondra Come is one of the excellent physicians she and I discussed today. I am pleased that she has been scheduled so quickly.  In regard to staging, she  is early stage. This is good news.

## 2021-10-29 NOTE — Patient Instructions (Signed)
It is good to see you today. We will refer you to radiation oncology for treatment options. We will also refer you to Thoracic Surgery for surgical options also. You should get a call to schedule these appointments very soon. If you do not get a call in the next few days, call us back so we can inquire. Follow up 2 months with Dr. Valeta Harms  to ensure you are doing well.  Please contact office for sooner follow up if symptoms do not improve or worsen or seek emergency care

## 2021-10-29 NOTE — Progress Notes (Addendum)
History of Present Illness [name] is a 75 y.o. female former smoker quit 1997 with a 42 pack year smoking history. She has COPD and abnormal chest imaging concerning for bronchogenic cancer. She is followed by Dr. Valeta Harms for PET avid Spiculated right upper lobe nodule.   10/29/2021 Pt. Presents for follow up after bronch. She had Flexible video fiberoptic bronchoscopy with robotic assistance and biopsies on 10/23/2021. We discussed that the biopsies  were  positive  for adenocarcinoma. We discussed both radiation oncology options for treatment , and surgical options for treatment. She remains undecided, but wants to see both to examine both options. She is here with her husband, who is in agreement with this plan. She states she has been doing well. She states the  sore throat  she had immediately post procedure is gone. She is just anxious waiting to get scheduled to see the radiation oncology/Thoracic Surgeons to determine next steps. She denies fever, chest pain, orthopnea or hemoptysis. Saturations are 98% today on RA, and she is in no distress. .   Test Results:  Procedure: Flexible video fiberoptic bronchoscopy with robotic assistance and biopsies.  FINAL MICROSCOPIC DIAGNOSIS:   A. LUNG, RUL, BRUSHING:  -  Malignant cells consistent with adenocarcinoma    B. LUNG, RUL, NEEDLE  BIOPSIES:  -  Malignant cells consistent with adenocarcinoma     CBC Latest Ref Rng & Units 10/23/2021 10/16/2020 09/06/2020  WBC 4.0 - 10.5 K/uL 5.1 6.0 6.2  Hemoglobin 12.0 - 15.0 g/dL 12.3 13.8 14.2  Hematocrit 36.0 - 46.0 % 39.0 41.2 45.0  Platelets 150 - 400 K/uL 345 399.0 373    BMP Latest Ref Rng & Units 10/23/2021 09/19/2021 04/04/2021  Glucose 70 - 99 mg/dL 152(H) 73 74  BUN 8 - 23 mg/dL <5(L) 5(L) 5(L)  Creatinine 0.44 - 1.00 mg/dL 0.87 0.82 0.86  BUN/Creat Ratio 12 - 28 - 6(L) 6(L)  Sodium 135 - 145 mmol/L 139 141 143  Potassium 3.5 - 5.1 mmol/L 4.2 4.7 5.4(H)  Chloride 98 - 111  mmol/L 102 102 101  CO2 22 - 32 mmol/L 28 25 26   Calcium 8.9 - 10.3 mg/dL 9.2 9.5 10.1    BNP No results found for: BNP  ProBNP    Component Value Date/Time   PROBNP 5.0 02/28/2014 1503    PFT    Component Value Date/Time   FEV1PRE 0.70 10/02/2016 1021   FEV1POST 1.23 03/17/2014 1600   FVCPRE 1.32 10/02/2016 1021   FVCPOST 1.93 03/17/2014 1600   TLC 4.57 03/17/2014 1600   DLCOUNC 6.18 10/02/2016 1021   PREFEV1FVCRT 53 10/02/2016 1021   PSTFEV1FVCRT 63 03/17/2014 1600    DG CHEST PORT 1 VIEW  Result Date: 10/23/2021 CLINICAL DATA:  Status post bronchoscopy EXAM: PORTABLE CHEST 1 VIEW COMPARISON:  08/03/2021 FINDINGS: The heart size and mediastinal contours are within normal limits. No acute abnormality of the lungs. Biopsy marking clip projects over a nodule of the superior segment right lower lobe. The visualized skeletal structures are unremarkable. IMPRESSION: No acute abnormality of the lungs. No pneumothorax. Biopsy marking clip projects over a nodule of the superior segment right lower lobe following bronchoscopy. Electronically Signed   By: Delanna Ahmadi M.D.   On: 10/23/2021 11:29   CT Super D Chest Wo Contrast  Result Date: 10/22/2021 CLINICAL DATA:  Shortness of breath, biopsy scheduled next week. EXAM: CT CHEST WITHOUT CONTRAST TECHNIQUE: Multidetector CT imaging of the chest was performed using thin slice collimation for  electromagnetic bronchoscopy planning purposes, without intravenous contrast. COMPARISON:  PET 09/11/2021 and CT chest 08/23/2021. FINDINGS: Cardiovascular: Atherosclerotic calcification of the aorta and coronary arteries. Heart size normal. No pericardial effusion. Mediastinum/Nodes: Subcentimeter low-attenuation lesion in the right thyroid. No follow-up recommended. (Ref: J Am Coll Radiol. 2015 Feb;12(2): 143-50).No pathologically enlarged mediastinal or axillary lymph nodes. Hilar regions are difficult to definitively evaluate without IV contrast.  Esophagus is grossly unremarkable. Lungs/Pleura: Mild centrilobular and paraseptal emphysema. Spiculated nodule in the posterior segment right upper lobe has extensions to the adjacent major fissure, retracting it slightly, and measures 1.5 x 2.4 cm (7/59), similar to 08/23/2021 when remeasured in a similar fashion. 5 mm nodule along the minor fissure (7/85), stable. Scarring in the left lower lobe, adjacent to the left hemidiaphragm. No pleural fluid. Airway is unremarkable. Upper Abdomen: Visualized portions of the liver, gallbladder and right adrenal gland are unremarkable. Slight nodular thickening of the left adrenal gland. Low-attenuation lesion in the right kidney is incompletely visualized. Scarring in the left kidney. Visualized portions of the spleen, pancreas, stomach and bowel are grossly unremarkable. Musculoskeletal: Degenerative changes in the spine. No worrisome lytic or sclerotic lesions. IMPRESSION: 1. Spiculated right upper lobe nodule appears similar to 08/23/2021 and was hypermetabolic on 61/95/0932. Findings are compatible with primary bronchogenic carcinoma. 2. Aortic atherosclerosis (ICD10-I70.0). Coronary artery calcification. 3.  Emphysema (ICD10-J43.9). Electronically Signed   By: Lorin Picket M.D.   On: 10/22/2021 12:37   DG C-ARM BRONCHOSCOPY  Result Date: 10/23/2021 C-ARM BRONCHOSCOPY: Fluoroscopy was utilized by the requesting physician.  No radiographic interpretation.     Past medical hx Past Medical History:  Diagnosis Date   Arthritis    Asthma    Complication of anesthesia    COPD (chronic obstructive pulmonary disease) (HCC)    Diabetes ([place])    High cholesterol    Hypertension    Pneumonia 09/10/2017   PONV (postoperative nausea and vomiting)    Stroke ([place]) 2007   mini strokes     Social History   Tobacco Use   Smoking status: Former    Packs/day: 0.50    Years: 42.00    Pack years: 21.00    Types: Cigarettes    Start date: 01/30/1967    Quit  date: 01/30/2006    Years since quitting: 15.7   Smokeless tobacco: Never   Tobacco comments:    intermittent cycles b/w 1/4-1/2ppd  Vaping Use   Vaping Use: Never used  Substance Use Topics   Alcohol use: Not Currently    Comment: 1 drink a year, maybe   Drug use: No    Ms.Mella reports that she quit smoking about 15 years ago. Her smoking use included cigarettes. She started smoking about 54 years ago. She has a 21.00 pack-year smoking history. She has never used smokeless tobacco. She reports that she does not currently use alcohol. She reports that she does not use drugs.  Tobacco Cessation: Counseled to remain smoke free  Past surgical hx, Family hx, Social hx all reviewed.  Current Outpatient Medications on File Prior to Visit  Medication Sig   Accu-Chek FastClix Lancets MISC Inject 1 each as directed daily. Use as directed to check blood sugars daily dx: e11.22   albuterol (VENTOLIN HFA) 108 (90 Base) MCG/ACT inhaler Inhale 2 puffs into the lungs every 4 (four) hours as needed for wheezing or shortness of breath. (Patient taking differently: Inhale 2 puffs into the lungs every 4 (four) hours as needed for wheezing.)   aspirin  81 MG tablet Take 81 mg by mouth daily.   Biotin 10000 MCG TBDP Take 10,000 mcg by mouth daily.   bisoprolol (ZEBETA) 5 MG tablet TAKE 1/2 TABLET(2.5 MG) BY MOUTH DAILY   Budeson-Glycopyrrol-Formoterol (BREZTRI AEROSPHERE) 160-9-4.8 MCG/ACT AERO Inhale 2 puffs into the lungs 2 (two) times daily.   CALCIUM-MAGNESIUM-ZINC PO Take 1 capsule by mouth at bedtime.   Cholecalciferol (VITAMIN D3) 2000 UNITS TABS Take 2,000 Units by mouth at bedtime.   famotidine (PEPCID) 20 MG tablet One after supper (Patient taking differently: Take 20 mg by mouth daily with supper.)   gabapentin (NEURONTIN) 300 MG capsule Take 1 capsule (300 mg total) by mouth 4 (four) times daily. (Patient taking differently: Take 300 mg by mouth 2 (two) times daily.)   glucose blood (ACCU-CHEK  GUIDE) test strip 1 each by Other route daily. Use as instructed to check blood sugars 1 time per day dx: e11.22   ipratropium-albuterol (DUONEB) 0.5-2.5 (3) MG/3ML SOLN USE 3 ML VIA NEBULIZER EVERY 6 HOURS AS NEEDED   lidocaine (LIDODERM) 5 % Place 1 patch onto the skin daily as needed (pain). Remove & Discard patch within 12 hours or as directed by MD   montelukast (SINGULAIR) 10 MG tablet TAKE 1 TABLET(10 MG) BY MOUTH AT BEDTIME   pantoprazole (PROTONIX) 40 MG tablet TAKE 1 TABLET BY MOUTH 30 MINS BEFORE FIRST MEAL OF THE DAY   rosuvastatin (CRESTOR) 20 MG tablet TAKE 1 TABLET(20 MG) BY MOUTH AT BEDTIME   Semaglutide, 1 MG/DOSE, (OZEMPIC, 1 MG/DOSE,) 4 MG/3ML SOPN Inject 1 mg into the skin once a week. (Patient taking differently: Inject 1 mg into the skin once a week. Friday)   traMADol (ULTRAM) 50 MG tablet Take 1 tablet (50 mg total) by mouth every 6 (six) hours as needed. (Patient taking differently: Take 50 mg by mouth every 6 (six) hours as needed (for cough).)   No current facility-administered medications on file prior to visit.     Allergies  Allergen Reactions   Other Swelling    HAIR DYE    Review Of Systems:  Constitutional:   No  weight loss, night sweats,  Fevers, chills, fatigue, or  lassitude.  HEENT:   No headaches,  Difficulty swallowing,  Tooth/dental problems, or  Sore throat,                No sneezing, itching, ear ache, nasal congestion, post nasal drip,   CV:  No chest pain,  Orthopnea, PND, swelling in lower extremities, anasarca, dizziness, palpitations, syncope.   GI  No heartburn, indigestion, abdominal pain, nausea, vomiting, diarrhea, change in bowel habits, loss of appetite, bloody stools.   Resp: No shortness of breath with exertion or at rest.  No excess mucus, no productive cough,  No non-productive cough,  No coughing up of blood.  No change in color of mucus.  No wheezing.  No chest wall deformity  Skin: no rash or lesions.  GU: no dysuria,  change in color of urine, no urgency or frequency.  No flank pain, no hematuria   MS:  No joint pain or swelling.  No decreased range of motion.  No back pain.  Psych:  No change in mood or affect. No depression  + anxiety.  No memory loss.   Vital Signs BP 140/82   Pulse 93   Temp 98 F (36.7 C)   Ht 5\' 5"  (1.651 m)   Wt 202 lb (91.6 kg)   SpO2 98%  BMI 33.61 kg/m    Physical Exam:  General- No distress,  A&Ox3, pleasant and appropriate ENT: No sinus tenderness, TM clear, pale nasal mucosa, no oral exudate,no post nasal drip, no LAN Cardiac: S1, S2, regular rate and rhythm, no murmur Chest: No wheeze/ rales/ dullness; no accessory muscle use, no nasal flaring, no sternal retractions Abd.: Soft Non-tender, ND, BS +. Body mass index is 33.61 kg/m.  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 Valeta Harms or Judson Roch NP   to ensure you are doing well.  Call us if you need [place] sooner  I spent 40 minutes dedicated to the care of this patient on the date of this encounter to include pre-visit review of records, face-to-face time with the patient discussing conditions above, post visit ordering of testing, clinical documentation with the electronic health record, making appropriate referrals as documented, and communicating necessary information to the patient's healthcare team.   Magdalen Spatz, NP 10/29/2021  10:40 AM

## 2021-10-29 NOTE — Progress Notes (Signed)
Location of tumor and Histology per Pathology Report: RUL lung adenocarcinoma  Biopsy:    Past/Anticipated interventions by surgeon, if any:   Surgeon: Garner Nash, DO  Operation: Flexible video fiberoptic bronchoscopy with robotic assistance and biopsies.  Past/Anticipated interventions by medical oncology, if any: Chemotherapy - not at this time    Pain issues, if any:  no   SAFETY ISSUES: Prior radiation? no Pacemaker/ICD? no Possible current pregnancy? no Is the patient on methotrexate? no  Current Complaints / other details:  Shortness of breath with activity    Vitals:   10/31/21 0951  BP: (!) 144/59  Pulse: (!) 102  Resp: 20  Temp: 97.8 F (36.6 C)  SpO2: 100%  Weight: 200 lb 12.8 oz (91.1 kg)  Height: 5\' 5"  (1.651 m)

## 2021-10-29 NOTE — Chronic Care Management (AMB) (Signed)
  [name] was reminded to have all medications, supplements and any blood glucose and blood pressure readings available for review with [name], Pharm. D, at her telephone visit on 10-30-2021 at 4:00.   Questions: Have you had any recent office visit or specialist visit outside of [place]? Patient stated no  Are there any concerns you would like to discuss during your office visit? Patient stated no  Are you having any problems obtaining your medications? (Whether it pharmacy issues or cost) Patient stated no  If patient has any PAP medications ask if they are having any problems getting their PAP medication or refill? Patient stated no  Care Gaps: Covid booster overdue  Yearly foot exam overdue last completed 09-06-2020  Star Rating Drug: Ozempic 1 mg- Patient assistance Rosuvastatin 20 mg- Last filled 10-24-2021 90 DS [facility]  Any gaps in medications fill history? No  [place] Pharmacist Assistant [phone]

## 2021-10-30 ENCOUNTER — Ambulatory Visit (INDEPENDENT_AMBULATORY_CARE_PROVIDER_SITE_OTHER): Payer: Medicare Other

## 2021-10-30 DIAGNOSIS — I129 Hypertensive chronic kidney disease with stage 1 through stage 4 chronic kidney disease, or unspecified chronic kidney disease: Secondary | ICD-10-CM

## 2021-10-30 DIAGNOSIS — E7800 Pure hypercholesterolemia, unspecified: Secondary | ICD-10-CM

## 2021-10-30 NOTE — Telephone Encounter (Signed)
Duplicate message as pt sent this to SG which I also added BI in as well. Closing encounter.

## 2021-10-30 NOTE — Telephone Encounter (Signed)
Spoke with pt and relayed message as dictated by Judson Roch. Pt stated understanding. Nothing further needed at this time.

## 2021-10-30 NOTE — Telephone Encounter (Signed)
Mychart message sent by pt: [name], [name] [facility] (supporting Magdalen Spatz, NP) Yesterday (12:23 PM)   I want to thank you for the tender care you provided me. It is so very important to have all the people involved be attentive and caring. My prayer group has already prayed for you.  I forgot to ask what stage of cancer am I in? Thank you for your prompt response. Thank you.      Routing to both Sarah and Dr. Valeta Harms. Please advise.

## 2021-10-30 NOTE — Progress Notes (Signed)
Radiation Oncology         [phone] ________________________________  Initial Outpatient Consultation  Name: [name] MRN: [id number]  Date: 10/31/2021  DOB: 1946/11/14  AJ:[email or web address], Bailey Mech, MD  Garner Nash, DO   REFERRING PHYSICIAN: Garner Nash, DO  DIAGNOSIS: The encounter diagnosis was Solitary pulmonary nodule on lung CT.  Adenocarcinoma - RUL pulmonary nodule, clinical stage I  HISTORY OF PRESENT ILLNESS::[name] is a 75 y.o. female who is accompanied by her husband. she is seen as a courtesy of Dr. Valeta Harms for an opinion concerning radiation therapy as part of management for her recently diagnosed adenocarcinoma of the RUL. The patient presented to Dr. [name] at [facility] on 07/23/21 with increasing shortness of breath over several months.  Patient reported the cough as productive of thick light yellow sputum, also reported the cough to interrupt her sleep. Per Dr. Gustavus Bryant visit note, the patient seems to have had issues with cough since 2011, likely due to the patient's COPD (which has been followed by [facility] pulmonology since 2015).  High resolution CT scan of the chest on 08/23/21 revealed an aggressive right upper lobe pulmonary nodule measuring 1.6 x 1.6 x 1.8 cm, concerning for primary bronchogenic neoplasm. PET on 09/11/21 further revealed a hypermetabolic uptake within this lesion concerning for a primary lung malignancy.   Patient then met again with Dr. [name] on 09/13/21 and discussed referral for radiation oncology versus surgical resection.  Patient was then referred to Dr. Valeta Harms on 10/08/21 to discuss lung nodule and consideration for biopsy. Following discussion of risks, the patient opted to proceed with robotic assisted bronchoscopy and tissue sampling.   Super D chest CT on 10/19/21 again demonstrated the spiculated right upper nodule as similar in appearance to prior imaging. Findings remained compatible with primary bronchogenic  carcinoma.  Bronchoscopy with RUL biopsies performed on 10/23/21 revealed: malignant cells consistent with adenocarcinoma.   Post-procedure CXR performed on 10/23/21 showed no acute abnormality of the lungs and no pneumothorax.   The patient followed up at [place] with Eric Form FNP on 10/29/21 to discuss next treatment steps. The patient was noted to be undecided on whether to pursue radiation treatment or surgical interventions and requested to examine both options further. The patient was otherwise noted to be doing well post bronchoscopy other than normal anxiety related to her condition.    PREVIOUS RADIATION THERAPY: No  PAST MEDICAL HISTORY:  Past Medical History:  Diagnosis Date   Arthritis    Asthma    Complication of anesthesia    COPD (chronic obstructive pulmonary disease) ([place])    Diabetes ([place])    High cholesterol    Hypertension    Lung cancer ([place])    Pneumonia 09/10/2017   PONV (postoperative nausea and vomiting)    Stroke ([place]) 2007   mini strokes    PAST SURGICAL HISTORY: Past Surgical History:  Procedure Laterality Date   ABDOMINAL HYSTERECTOMY  1995   BRONCHIAL BIOPSY  10/23/2021   Procedure: BRONCHIAL BIOPSIES;  Surgeon: Garner Nash, DO;  Location: [place] [facility];  Service: Pulmonary;;   BRONCHIAL BRUSHINGS  10/23/2021   Procedure: BRONCHIAL BRUSHINGS;  Surgeon: Garner Nash, DO;  Location: [facility];  Service: Pulmonary;;   BRONCHIAL NEEDLE ASPIRATION BIOPSY  10/23/2021   Procedure: BRONCHIAL NEEDLE ASPIRATION BIOPSIES;  Surgeon: Garner Nash, DO;  Location: [facility];  Service: Pulmonary;;   carpel tunnel surgery Right 2006   COLONOSCOPY  2014   COLOSTOMY  2007   colostomy let down  2008   endoscopy  2014   FIDUCIAL MARKER PLACEMENT  10/23/2021   Procedure: FIDUCIAL MARKER PLACEMENT;  Surgeon: Garner Nash, DO;  Location: [place] [facility];  Service: Pulmonary;;   rotator cuff tear Left 04/14/2020   VIDEO BRONCHOSCOPY  WITH ENDOBRONCHIAL NAVIGATION Right 10/23/2021   Procedure: VIDEO BRONCHOSCOPY WITH ENDOBRONCHIAL NAVIGATION;  Surgeon: Garner Nash, DO;  Location: [place];  Service: Pulmonary;  Laterality: Right;  ION, w/ fiducial placement   VIDEO BRONCHOSCOPY WITH RADIAL ENDOBRONCHIAL ULTRASOUND  10/23/2021   Procedure: VIDEO BRONCHOSCOPY WITH RADIAL ENDOBRONCHIAL ULTRASOUND;  Surgeon: Garner Nash, DO;  Location: [facility];  Service: Pulmonary;;    FAMILY HISTORY:  Family History  Problem Relation Age of Onset   Heart murmur Mother    Heart attack Father    Diabetes Father    Diabetes Brother     SOCIAL HISTORY:  Social History   Tobacco Use   Smoking status: Former    Packs/day: 0.50    Years: 42.00    Pack years: 21.00    Types: Cigarettes    Start date: 01/30/1967    Quit date: 01/30/2006    Years since quitting: 15.7   Smokeless tobacco: Never   Tobacco comments:    intermittent cycles b/w 1/4-1/2ppd  Vaping Use   Vaping Use: Never used  Substance Use Topics   Alcohol use: Not Currently    Comment: 1 drink a year, maybe   Drug use: No    ALLERGIES:  Allergies  Allergen Reactions   Other Swelling    HAIR DYE    MEDICATIONS:  Current Outpatient Medications  Medication Sig Dispense Refill   albuterol (VENTOLIN HFA) 108 (90 Base) MCG/ACT inhaler Inhale 2 puffs into the lungs every 4 (four) hours as needed for wheezing or shortness of breath. (Patient taking differently: Inhale 2 puffs into the lungs every 4 (four) hours as needed for wheezing.) 18 g 12   aspirin 81 MG tablet Take 81 mg by mouth daily.     Biotin 10000 MCG TBDP Take 10,000 mcg by mouth daily.     bisoprolol (ZEBETA) 5 MG tablet TAKE 1/2 TABLET(2.5 MG) BY MOUTH DAILY 45 tablet 3   Budeson-Glycopyrrol-Formoterol (BREZTRI AEROSPHERE) 160-9-4.8 MCG/ACT AERO Inhale 2 puffs into the lungs 2 (two) times daily. 10.7 g 6   CALCIUM-MAGNESIUM-ZINC PO Take 1 capsule by mouth at bedtime.     Cholecalciferol  (VITAMIN D3) 2000 UNITS TABS Take 2,000 Units by mouth at bedtime.     famotidine (PEPCID) 20 MG tablet One after supper (Patient taking differently: Take 20 mg by mouth daily with supper.) 90 tablet 3   gabapentin (NEURONTIN) 300 MG capsule Take 1 capsule (300 mg total) by mouth 4 (four) times daily. (Patient taking differently: Take 300 mg by mouth 2 (two) times daily.) 120 capsule 5   ipratropium-albuterol (DUONEB) 0.5-2.5 (3) MG/3ML SOLN USE 3 ML VIA NEBULIZER EVERY 6 HOURS AS NEEDED 180 mL 1   montelukast (SINGULAIR) 10 MG tablet TAKE 1 TABLET(10 MG) BY MOUTH AT BEDTIME 90 tablet 2   pantoprazole (PROTONIX) 40 MG tablet TAKE 1 TABLET BY MOUTH 30 MINS BEFORE FIRST MEAL OF THE DAY 90 tablet 2   rosuvastatin (CRESTOR) 20 MG tablet TAKE 1 TABLET(20 MG) BY MOUTH AT BEDTIME 90 tablet 1   Semaglutide, 1 MG/DOSE, (OZEMPIC, 1 MG/DOSE,) 4 MG/3ML SOPN Inject 1 mg into the skin once a week. (Patient taking differently: Inject 1 mg into  the skin once a week. Friday) 3 mL 3   Accu-Chek FastClix Lancets MISC Inject 1 each as directed daily. Use as directed to check blood sugars daily dx: e11.22 (Patient not taking: Reported on 10/31/2021) 50 each 11   glucose blood (ACCU-CHEK GUIDE) test strip 1 each by Other route daily. Use as instructed to check blood sugars 1 time per day dx: e11.22 (Patient not taking: Reported on 10/31/2021) 50 each 11   lidocaine (LIDODERM) 5 % Place 1 patch onto the skin daily as needed (pain). Remove & Discard patch within 12 hours or as directed by MD (Patient not taking: Reported on 10/31/2021)     traMADol (ULTRAM) 50 MG tablet Take 1 tablet (50 mg total) by mouth every 6 (six) hours as needed. (Patient not taking: Reported on 10/31/2021) 40 tablet 0   No current facility-administered medications for this encounter.    REVIEW OF SYSTEMS:  A 10+ POINT REVIEW OF SYSTEMS WAS OBTAINED including neurology, dermatology, psychiatry, cardiac, respiratory, lymph, extremities, GI, GU,  musculoskeletal, constitutional, reproductive, HEENT.  She denies any pain within the chest area significant cough or hemoptysis.  She denies any headaches or visual problems.   PHYSICAL EXAM:  height is '5\' 5"'  (1.651 m) and weight is 200 lb 12.8 oz (91.1 kg). Her temperature is 97.8 F (36.6 C). Her blood pressure is 144/59 (abnormal) and her pulse is 102 (abnormal). Her respiration is 20 and oxygen saturation is 100%.   General: Alert and oriented, in no acute distress HEENT: Head is normocephalic. Extraocular movements are intact.  Neck: Neck is supple, no palpable cervical or supraclavicular lymphadenopathy. Heart: Regular in rate and rhythm with no murmurs, rubs, or gallops. Chest: Clear to auscultation bilaterally, with no rhonchi, wheezes, or rales. Abdomen: Soft, nontender, nondistended, with no rigidity or guarding. Extremities: No cyanosis or edema. Lymphatics: see Neck Exam Skin: No concerning lesions. Musculoskeletal: symmetric strength and muscle tone throughout. Neurologic: Cranial nerves II through XII are grossly intact. No obvious focalities. Speech is fluent. Coordination is intact. Psychiatric: Judgment and insight are intact. Affect is appropriate.   ECOG = 1  0 - Asymptomatic (Fully active, able to carry on all predisease activities without restriction)  1 - Symptomatic but completely ambulatory (Restricted in physically strenuous activity but ambulatory and able to carry out work of a light or sedentary nature. For example, light housework, office work)  2 - Symptomatic, <50% in bed during the day (Ambulatory and capable of all self care but unable to carry out any work activities. Up and about more than 50% of waking hours)  3 - Symptomatic, >50% in bed, but not bedbound (Capable of only limited self-care, confined to bed or chair 50% or more of waking hours)  4 - Bedbound (Completely disabled. Cannot carry on any self-care. Totally confined to bed or chair)  5 -  Death   Eustace Pen MM, Creech RH, Tormey DC, et al. [phone]). "Toxicity and response criteria of the [facility]". [place] Oncol. 5 (6): 649-55  LABORATORY DATA:  Lab Results  Component Value Date   WBC 5.1 10/23/2021   HGB 12.3 10/23/2021   HCT 39.0 10/23/2021   MCV 89.9 10/23/2021   PLT 345 10/23/2021   NEUTROABS 3.0 10/16/2020   Lab Results  Component Value Date   NA 139 10/23/2021   K 4.2 10/23/2021   CL 102 10/23/2021   CO2 28 10/23/2021   GLUCOSE 152 (H) 10/23/2021   CREATININE 0.87 10/23/2021  CALCIUM 9.2 10/23/2021      RADIOGRAPHY: DG CHEST PORT 1 VIEW  Result Date: 10/23/2021 CLINICAL DATA:  Status post bronchoscopy EXAM: PORTABLE CHEST 1 VIEW COMPARISON:  08/03/2021 FINDINGS: The heart size and mediastinal contours are within normal limits. No acute abnormality of the lungs. Biopsy marking clip projects over a nodule of the superior segment right lower lobe. The visualized skeletal structures are unremarkable. IMPRESSION: No acute abnormality of the lungs. No pneumothorax. Biopsy marking clip projects over a nodule of the superior segment right lower lobe following bronchoscopy. Electronically Signed   By: Delanna Ahmadi M.D.   On: 10/23/2021 11:29   CT Super D Chest Wo Contrast  Result Date: 10/22/2021 CLINICAL DATA:  Shortness of breath, biopsy scheduled next week. EXAM: CT CHEST WITHOUT CONTRAST TECHNIQUE: Multidetector CT imaging of the chest was performed using thin slice collimation for electromagnetic bronchoscopy planning purposes, without intravenous contrast. COMPARISON:  PET 09/11/2021 and CT chest 08/23/2021. FINDINGS: Cardiovascular: Atherosclerotic calcification of the aorta and coronary arteries. Heart size normal. No pericardial effusion. Mediastinum/Nodes: Subcentimeter low-attenuation lesion in the right thyroid. No follow-up recommended. (Ref: J Am Coll Radiol. 2015 Feb;12(2): 143-50).No pathologically enlarged mediastinal or  axillary lymph nodes. Hilar regions are difficult to definitively evaluate without IV contrast. Esophagus is grossly unremarkable. Lungs/Pleura: Mild centrilobular and paraseptal emphysema. Spiculated nodule in the posterior segment right upper lobe has extensions to the adjacent major fissure, retracting it slightly, and measures 1.5 x 2.4 cm (7/59), similar to 08/23/2021 when remeasured in a similar fashion. 5 mm nodule along the minor fissure (7/85), stable. Scarring in the left lower lobe, adjacent to the left hemidiaphragm. No pleural fluid. Airway is unremarkable. Upper Abdomen: Visualized portions of the liver, gallbladder and right adrenal gland are unremarkable. Slight nodular thickening of the left adrenal gland. Low-attenuation lesion in the right kidney is incompletely visualized. Scarring in the left kidney. Visualized portions of the spleen, pancreas, stomach and bowel are grossly unremarkable. Musculoskeletal: Degenerative changes in the spine. No worrisome lytic or sclerotic lesions. IMPRESSION: 1. Spiculated right upper lobe nodule appears similar to 08/23/2021 and was hypermetabolic on 51/01/5851. Findings are compatible with primary bronchogenic carcinoma. 2. Aortic atherosclerosis (ICD10-I70.0). Coronary artery calcification. 3.  Emphysema (ICD10-J43.9). Electronically Signed   By: Lorin Picket M.D.   On: 10/22/2021 12:37   DG C-ARM BRONCHOSCOPY  Result Date: 10/23/2021 C-ARM BRONCHOSCOPY: Fluoroscopy was utilized by the requesting physician.  No radiographic interpretation.      IMPRESSION: Adenocarcinoma - RUL pulmonary nodule clinical stage I  The patient appears to have a very early stage adenocarcinoma presenting in the right upper lobe.  She would be a candidate for cure with either surgical intervention or radiation therapy in particular SBRT.  I discussed with the patient that her best chances for cure would be surgery.  She will be meeting with Dr. Kipp Brood in the next few  days to discuss surgical intervention.  Today, I talked to the patient and  her husband about the findings and work-up thus far.  We discussed the natural history of non-small cell lung cancer and general treatment, highlighting the role of radiotherapy in the management.  We discussed the available radiation techniques, and focused on the details of logistics and delivery.  We reviewed the anticipated acute and late sequelae associated with radiation in this setting.  The patient was encouraged to ask questions that I answered to the best of my ability.   PLAN: The patient will be meeting with Dr. Kipp Brood on  November 11.  After she has met with him she will make a decision concerning which treatment approach for management of her clinical stage I non-small cell lung cancer.  I have asked her to call me if she decides to proceed with radiation therapy for management.   60 minutes of total time was spent for this patient encounter, including preparation, face-to-face counseling with the patient and coordination of care, physical exam, and documentation of the encounter.   ------------------------------------------------  Blair Promise, PhD, MD  This document serves as a record of services personally performed by Gery Pray, MD. It was created on his behalf by Roney Mans, a trained medical scribe. The creation of this record is based on the scribe's personal observations and the provider's statements to them. This document has been checked and approved by the attending provider.

## 2021-10-30 NOTE — Telephone Encounter (Signed)
Mychart message sent by pt: [place], [facility] (supporting Elie Confer, Lars Masson, NP) Yesterday (1:52 PM)   I just received an appointment with Dr. Sondra Come of the cancer center. Is he one of the names you mentioned?     Sarah, please advise on this for pt.

## 2021-10-30 NOTE — Progress Notes (Signed)
Chronic Care Management Pharmacy Note  11/06/2021 Name:  [name] MRN:  [id number] DOB:  [birth year]  Summary: Patient reports that she is not focused at this time due to her recent prognosis.   Recommendations/Changes made from today's visit: Recommend patient check her BS once per week.  Also recommended that she try counseling.   Plan: At this time she is not interested in any counseling but will consider as she goes through the process.    Subjective: [name] is an 75 y.o. year old female who is a primary patient of [name], MD.  The CCM team was consulted for assistance with disease management and care coordination needs.    Engaged with patient by telephone for follow up visit in response to provider referral for pharmacy case management and/or care coordination services.   Consent to Services:  The patient was given information about Chronic Care Management services, agreed to services, and gave verbal consent prior to initiation of services.  Please see initial visit note for detailed documentation.   Patient Care Team: [name], MD as PCP - General (Internal Medicine) Lynne Logan, RN as Case Manager Mayford Knife, [facility] (Pharmacist)  Recent office visits: 09/12/2021 PCP OV   Recent consult visits: 10/08/2021 Pulmonology OV 09/13/2021 Pulmonology [place] visits: None in previous 6 months   Objective:  Lab Results  Component Value Date   CREATININE 0.87 10/23/2021   BUN <5 (L) 10/23/2021   GFRNONAA >60 10/23/2021   GFRAA 81 01/04/2021   NA 139 10/23/2021   K 4.2 10/23/2021   CALCIUM 9.2 10/23/2021   CO2 28 10/23/2021   GLUCOSE 152 (H) 10/23/2021    Lab Results  Component Value Date/Time   HGBA1C 7.0 (H) 09/19/2021 11:09 AM   HGBA1C 6.3 (H) 04/04/2021 03:28 PM   MICROALBUR 10 09/06/2020 12:54 PM   MICROALBUR 10 09/01/2019 02:42 PM    Last diabetic Eye exam:  Lab Results  Component Value Date/Time    HMDIABEYEEXA No Retinopathy 12/04/2020 12:00 AM    Last diabetic Foot exam: No results found for: HMDIABFOOTEX   Lab Results  Component Value Date   CHOL 175 01/04/2021   HDL 62 01/04/2021   LDLCALC 88 01/04/2021   TRIG 145 01/04/2021   CHOLHDL 2.8 01/04/2021    Hepatic Function Latest Ref Rng & Units 01/04/2021 05/04/2020 09/01/2019  Total Protein 6.0 - 8.5 g/dL 7.0 7.1 7.7  Albumin 3.7 - 4.7 g/dL 4.2 4.6 4.7  AST 0 - 40 IU/L 33 34 22  ALT 0 - 32 IU/L '27 21 11  ' Alk Phosphatase 44 - 121 IU/L 70 74 70  Total Bilirubin 0.0 - 1.2 mg/dL 0.4 0.2 0.4    Lab Results  Component Value Date/Time   TSH 1.400 01/04/2020 10:57 AM   TSH 0.88 07/29/2018 12:00 AM    CBC Latest Ref Rng & Units 10/23/2021 10/16/2020 09/06/2020  WBC 4.0 - 10.5 K/uL 5.1 6.0 6.2  Hemoglobin 12.0 - 15.0 g/dL 12.3 13.8 14.2  Hematocrit 36.0 - 46.0 % 39.0 41.2 45.0  Platelets 150 - 400 K/uL 345 399.0 373    Lab Results  Component Value Date/Time   VD25OH 33.1 09/06/2020 02:25 PM   VD25OH 39.9 07/29/2018 12:00 AM    Clinical ASCVD: Yes  The 10-year ASCVD risk score (Arnett DK, et al., 2019) is: 56.7%   Values used to calculate the score:     Age: 58 years     Sex: Female  Is Non-Hispanic African American: Yes     Diabetic: Yes     Tobacco smoker: Yes     Systolic Blood Pressure: 010 mmHg     Is BP treated: Yes     HDL Cholesterol: 62 mg/dL     Total Cholesterol: 175 mg/dL    Depression screen [facility] 2/9 09/12/2021 09/06/2020 09/01/2019  Decreased Interest 0 0 0  Down, Depressed, Hopeless 0 0 3  PHQ - 2 Score 0 0 3  Altered sleeping - - 0  Tired, decreased energy - - 0  Change in appetite - - 0  Feeling bad or failure about yourself  - - 0  Trouble concentrating - - 0  Moving slowly or fidgety/restless - - 0  Suicidal thoughts - - 0  PHQ-9 Score - - 3  Difficult doing work/chores - - Not difficult at all  Some recent data might be hidden     Social History   Tobacco Use  Smoking Status Former    Packs/day: 0.50   Years: 42.00   Pack years: 21.00   Types: Cigarettes   Start date: 01/30/1967   Quit date: 01/30/2006   Years since quitting: 15.7  Smokeless Tobacco Never  Tobacco Comments   intermittent cycles b/w 1/4-1/2ppd   BP Readings from Last 3 Encounters:  10/31/21 (!) 144/59  10/29/21 140/82  10/23/21 129/65   Pulse Readings from Last 3 Encounters:  10/31/21 (!) 102  10/29/21 93  10/23/21 92   Wt Readings from Last 3 Encounters:  10/31/21 200 lb 12.8 oz (91.1 kg)  10/29/21 202 lb (91.6 kg)  10/23/21 201 lb (91.2 kg)   BMI Readings from Last 3 Encounters:  10/31/21 33.41 kg/m  10/29/21 33.61 kg/m  10/23/21 33.45 kg/m    Assessment/Interventions: Review of patient past medical history, allergies, medications, health status, including review of consultants reports, laboratory and other test data, was performed as part of comprehensive evaluation and provision of chronic care management services.   SDOH:  (Social Determinants of Health) assessments and interventions performed: No  SDOH Screenings   Alcohol Screen: Not on file  Depression (PHQ2-9): Low Risk    PHQ-2 Score: 0  Financial Resource Strain: Low Risk    Difficulty of Paying Living Expenses: Not hard at all  Food Insecurity: No Food Insecurity   Worried About Charity fundraiser in the Last Year: Never true   Ran Out of Food in the Last Year: Never true  Housing: Not on file  Physical Activity: Inactive   Days of Exercise per Week: 0 days   Minutes of Exercise per Session: 0 min  Social Connections: Engineer, building services of Communication with Friends and Family: More than three times a week   Frequency of Social Gatherings with Friends and Family: More than three times a week   Attends Religious Services: More than 4 times per year   Active Member of [employer] or Organizations: Yes   Attends Music therapist: More than 4 times per year   Marital Status: Married  Stress: No  Stress Concern Present   Feeling of Stress : Not at all  Tobacco Use: Medium Risk   Smoking Tobacco Use: Former   Smokeless Tobacco Use: Never   Passive Exposure: Not on Pensions consultant Needs: No Transportation Needs   Lack of Transportation (Medical): No   Lack of Transportation (Non-Medical): No    CCM Care Plan  Allergies  Allergen Reactions   Other Swelling  HAIR DYE    Medications Reviewed Today     Reviewed by Gery Pray, MD (Physician) on 10/31/21 at [place] List Status: <None>   Medication Order Taking? Sig Documenting Provider Last Dose Status Informant  Accu-Chek FastClix Lancets MISC [id number] No Inject 1 each as directed daily. Use as directed to check blood sugars daily dx: e11.22  Patient not taking: Reported on 10/31/2021   [name], MD Not Taking Active Self  albuterol (VENTOLIN HFA) 108 (90 Base) MCG/ACT inhaler [id number] Yes Inhale 2 puffs into the lungs every 4 (four) hours as needed for wheezing or shortness of breath.  Patient taking differently: Inhale 2 puffs into the lungs every 4 (four) hours as needed for wheezing.   [name], MD Taking Active Self  aspirin 81 MG tablet [id number] Yes Take 81 mg by mouth daily. [provider] Taking Active Self  Biotin 10000 MCG TBDP [id number] Yes Take 10,000 mcg by mouth daily. [provider] Taking Active Self  bisoprolol (ZEBETA) 5 MG tablet [id number] Yes TAKE 1/2 TABLET(2.5 MG) BY MOUTH DAILY [name], MD Taking Active Self  Budeson-Glycopyrrol-Formoterol (BREZTRI AEROSPHERE) 160-9-4.8 MCG/ACT AERO [id number] Yes Inhale 2 puffs into the lungs 2 (two) times daily. Tanda Rockers, MD Taking Active Self  CALCIUM-MAGNESIUM-ZINC PO [id number] Yes Take 1 capsule by mouth at bedtime. [provider] Taking Active Self  Cholecalciferol (VITAMIN D3) 2000 UNITS TABS [id number] Yes Take 2,000 Units by mouth at bedtime. [provider] Taking Active Self  famotidine  (PEPCID) 20 MG tablet [id number] Yes One after supper  Patient taking differently: Take 20 mg by mouth daily with supper.   Tanda Rockers, MD Taking Active Self  gabapentin (NEURONTIN) 300 MG capsule [id number] Yes Take 1 capsule (300 mg total) by mouth 4 (four) times daily.  Patient taking differently: Take 300 mg by mouth 2 (two) times daily.   Tanda Rockers, MD Taking Active   glucose blood (ACCU-CHEK GUIDE) test strip [id number] No 1 each by Other route daily. Use as instructed to check blood sugars 1 time per day dx: e11.22  Patient not taking: Reported on 10/31/2021   [name], MD Not Taking Active Self  ipratropium-albuterol (DUONEB) 0.5-2.5 (3) MG/3ML SOLN [id number] Yes USE 3 ML VIA NEBULIZER EVERY 6 HOURS AS NEEDED Minette Brine, FNP Taking Active Self  lidocaine (LIDODERM) 5 % [id number] No Place 1 patch onto the skin daily as needed (pain). Remove & Discard patch within 12 hours or as directed by MD  Patient not taking: Reported on 10/31/2021   [provider] Not Taking Active Self  montelukast (SINGULAIR) 10 MG tablet [id number] Yes TAKE 1 TABLET(10 MG) BY MOUTH AT BEDTIME Tanda Rockers, MD Taking Active Self  pantoprazole (PROTONIX) 40 MG tablet [id number] Yes TAKE 1 TABLET BY MOUTH 30 MINS BEFORE FIRST MEAL OF THE Lynnell Dike, MD Taking Active Self  rosuvastatin (CRESTOR) 20 MG tablet [id number] Yes TAKE 1 TABLET(20 MG) BY MOUTH AT BEDTIME [name], MD Taking Active   Semaglutide, 1 MG/DOSE, (OZEMPIC, 1 MG/DOSE,) 4 MG/3ML SOPN [id number] Yes Inject 1 mg into the skin once a week.  Patient taking differently: Inject 1 mg into the skin once a week. Friday   [name], MD Taking Active   traMADol Veatrice Bourbon) 50 MG tablet [id number] No Take 1 tablet (50 mg total) by mouth every 6 (six) hours as needed.  Patient not taking: Reported on 10/31/2021   Tanda Rockers, MD Not Taking Active  Patient Active Problem List   Diagnosis Date Noted    Atherosclerosis of aorta ([place]) 09/16/2021   Atherosclerosis 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 09/16/2021   Myalgia 09/16/2021   Solitary pulmonary nodule on lung CT 08/24/2021   Chest tightness 04/18/2021   Former smoker 08/21/2020   High cholesterol    Type II diabetes mellitus, uncontrolled 11/09/2018   Diarrhea    Community acquired pneumonia of left lower lobe of lung    Rhinovirus infection 10/02/2016   Acute asthma exacerbation 10/02/2016   COPD with acute exacerbation ([place]) 09/28/2016   Hypoxia 09/28/2016   Shortness of breath 09/28/2016   COPD exacerbation ([place]) 09/28/2016   Essential hypertension 11/03/2014   Cough 10/19/2014   COPD GOLD II  03/21/2014   Rhinitis, nonallergic 03/21/2014    Immunization History  Administered Date(s) Administered   Fluad Quad(high Dose 65+) 09/06/2020, 09/12/2021   Influenza Split 09/29/2013, 09/30/2015   Influenza Whole 08/30/2016   Influenza, High Dose Seasonal PF 09/29/2017, 09/14/2018, 09/01/2019   Influenza,inj,Quad PF,6+ Mos 09/12/2014   Influenza-Unspecified 09/14/2018   PFIZER(Purple Top)SARS-COV-2 Vaccination 01/19/2020, 02/09/2020, 12/21/2020   Pneumococcal Conjugate-13 01/06/2020   Pneumococcal Polysaccharide-23 10/07/2013   Pneumococcal-Unspecified 10/05/2013   Tdap 06/07/2019, 06/09/2019   Zoster Recombinat (Shingrix) 07/26/2020, 10/09/2020    Conditions to be addressed/monitored:  Hypertension and Hyperlipidemia  Care Plan : [place]  Updates made by Mayford Knife, [place] since 11/06/2021 12:00 AM     Problem: HTN, HLD   Priority: High     Long-Range Goal: Disease Management   Start Date: 02/06/2021  Recent Progress: On track  Priority: High  Note:   Current Barriers:  Unable to independently monitor therapeutic efficacy  Pharmacist Clinical Goal(s):  Patient will achieve adherence to monitoring guidelines and medication adherence to achieve therapeutic efficacy  through collaboration with PharmD and provider.   Interventions: 1:1 collaboration with [name], MD regarding development and update of comprehensive plan of care as evidenced by provider attestation and co-signature Inter-disciplinary care team collaboration (see longitudinal plan of care) Comprehensive medication review performed; medication list updated in electronic medical record  Hypertension (BP goal <130/80) -Controlled -Current treatment: Bisoprolol 5 mg tablet once per day -Current home readings: at this time patient is not checking BP at home, due to he recent cancer diagnosis -Current dietary habits: patient reports that she has lost a bit of her appetite.  -Current exercise habits: will discuss during next office visit -Denies hypotensive/hypertensive symptoms -Educated on BP goals and benefits of medications for prevention of heart attack, stroke and kidney damage; Importance of home blood pressure monitoring; Proper BP monitoring technique; -Counseled to monitor BP at home periodically, document, and provide log at future appointments -Recommended to continue current medication   Hyperlipidemia: (LDL goal < 70) -Not ideally controlled -Current treatment: Rosuvastatin 20 mg tablet once per day  -Medications previously tried: none noted   -Current dietary patterns: does not eat any fried or fatty foods  -Current exercise habits: she is not exercising -Educated on Cholesterol goals;  Benefits of statin for ASCVD risk reduction; Importance of limiting foods high in cholesterol; -Recommended to continue current medication   Patient Goals/Self-Care Activities Patient will:  - take medications as prescribed as evidenced by patient report and record review  Follow Up Plan: The patient has been provided with contact information for the care management team and has been advised to call with any health related questions or concerns.  Medication  Assistance: None required.  Patient affirms current coverage meets needs.  Compliance/Adherence/Medication fill history: Care Gaps: COVID-19 Booster  Star-Rating Drugs: Ozempic 1 mg  Rosuvastatin 20 mg tablet   Patient's preferred pharmacy is:  [id number], [place] - [address] AT [address] [address] [place] [address] Phone: [phone] Fax: [phone]  Uses pill box? Yes Pt endorses 95% compliance  We discussed: Benefits of medication synchronization, packaging and delivery as well as enhanced pharmacist oversight with [employer]. Patient decided to: Continue current medication management strategy  Care Plan and Follow Up Patient Decision:  Patient agrees to Care Plan and Follow-up.  Plan: The patient has been provided with contact information for the care management team and has been advised to call with any health related questions or concerns.   [name], CPP, PharmD Clinical Pharmacist Practitioner [facility] [phone]

## 2021-10-31 ENCOUNTER — Ambulatory Visit
Admission: RE | Admit: 2021-10-31 | Discharge: 2021-10-31 | Disposition: A | Discharge status: Home / Self Care | Payer: Medicare Other | Source: Ambulatory Visit | Attending: Radiation Oncology | Admitting: Radiation Oncology

## 2021-10-31 ENCOUNTER — Other Ambulatory Visit: Payer: Self-pay

## 2021-10-31 ENCOUNTER — Encounter: Payer: Self-pay | Admitting: Radiation Oncology

## 2021-10-31 VITALS — BP 144/59 | HR 102 | Temp 97.8°F | Resp 20 | Ht 65.0 in | Wt 200.8 lb

## 2021-10-31 DIAGNOSIS — E119 Type 2 diabetes mellitus without complications: Secondary | ICD-10-CM | POA: Insufficient documentation

## 2021-10-31 DIAGNOSIS — C3411 Malignant neoplasm of upper lobe, right bronchus or lung: Secondary | ICD-10-CM | POA: Insufficient documentation

## 2021-10-31 DIAGNOSIS — Z79899 Other long term (current) drug therapy: Secondary | ICD-10-CM | POA: Insufficient documentation

## 2021-10-31 DIAGNOSIS — I10 Essential (primary) hypertension: Secondary | ICD-10-CM | POA: Insufficient documentation

## 2021-10-31 DIAGNOSIS — E7800 Pure hypercholesterolemia, unspecified: Secondary | ICD-10-CM | POA: Insufficient documentation

## 2021-10-31 DIAGNOSIS — Z7982 Long term (current) use of aspirin: Secondary | ICD-10-CM | POA: Insufficient documentation

## 2021-10-31 DIAGNOSIS — R911 Solitary pulmonary nodule: Secondary | ICD-10-CM

## 2021-10-31 DIAGNOSIS — Z8673 Personal history of transient ischemic attack (TIA), and cerebral infarction without residual deficits: Secondary | ICD-10-CM | POA: Diagnosis not present

## 2021-10-31 DIAGNOSIS — J432 Centrilobular emphysema: Secondary | ICD-10-CM | POA: Insufficient documentation

## 2021-10-31 DIAGNOSIS — I700 Atherosclerosis of aorta: Secondary | ICD-10-CM | POA: Insufficient documentation

## 2021-10-31 DIAGNOSIS — Z87891 Personal history of nicotine dependence: Secondary | ICD-10-CM | POA: Diagnosis not present

## 2021-10-31 DIAGNOSIS — I2510 Atherosclerotic heart disease of native coronary artery without angina pectoris: Secondary | ICD-10-CM | POA: Diagnosis not present

## 2021-10-31 HISTORY — DX: Malignant neoplasm of unspecified part of unspecified bronchus or lung: C34.90

## 2021-10-31 NOTE — Progress Notes (Signed)
See MD note for nursing evaluation. °

## 2021-11-01 ENCOUNTER — Other Ambulatory Visit: Payer: Self-pay | Admitting: *Deleted

## 2021-11-01 DIAGNOSIS — R911 Solitary pulmonary nodule: Secondary | ICD-10-CM

## 2021-11-06 NOTE — Patient Instructions (Signed)
Visit Information It was great speaking with you today!  Please let me know if you have any questions about our visit.   Goals Addressed             This Visit's Progress    Manage My Medicine       Timeframe:  Long-Range Goal Priority:  High Start Date:    02/06/2021                          Expected End Date:  Next Follow Up Appointment: 02/27/2022    In Progress:   - call for medicine refill 2 or 3 days before it runs out - call if I am sick and can't take my medicine - keep a list of all the medicines I take; vitamins and herbals too - use a pillbox to sort medicine    Why is this important?   These steps will help you keep on track with your medicines.            Patient Care Plan: CCM Pharmacy Care Plan     Problem Identified: HTN, HLD   Priority: High     Long-Range Goal: Disease Management   Start Date: 02/06/2021  Recent Progress: On track  Priority: High  Note:   Current Barriers:  Unable to independently monitor therapeutic efficacy  Pharmacist Clinical Goal(s):  Patient will achieve adherence to monitoring guidelines and medication adherence to achieve therapeutic efficacy through collaboration with PharmD and provider.   Interventions: 1:1 collaboration with [name], MD regarding development and update of comprehensive plan of care as evidenced by provider attestation and co-signature Inter-disciplinary care team collaboration (see longitudinal plan of care) Comprehensive medication review performed; medication list updated in electronic medical record  Hypertension (BP goal <130/80) -Controlled -Current treatment: Bisoprolol 5 mg tablet once per day -Current home readings: at this time patient is not checking BP at home, due to he recent cancer diagnosis -Current dietary habits: patient reports that she has lost a bit of her appetite.  -Current exercise habits: will discuss during next office visit -Denies hypotensive/hypertensive  symptoms -Educated on BP goals and benefits of medications for prevention of heart attack, stroke and kidney damage; Importance of home blood pressure monitoring; Proper BP monitoring technique; -Counseled to monitor BP at home periodically, document, and provide log at future appointments -Recommended to continue current medication   Hyperlipidemia: (LDL goal < 70) -Not ideally controlled -Current treatment: Rosuvastatin 20 mg tablet once per day  -Medications previously tried: none noted   -Current dietary patterns: does not eat any fried or fatty foods  -Current exercise habits: she is not exercising -Educated on Cholesterol goals;  Benefits of statin for ASCVD risk reduction; Importance of limiting foods high in cholesterol; -Recommended to continue current medication   Patient Goals/Self-Care Activities Patient will:  - take medications as prescribed as evidenced by patient report and record review  Follow Up Plan: The patient has been provided with contact information for the care management team and has been advised to call with any health related questions or concerns.         Patient agreed to services and verbal consent obtained.   The patient verbalized understanding of instructions, educational materials, and care plan provided today and agreed to receive a mailed copy of patient instructions, educational materials, and care plan.   [name], CPP, PharmD Clinical Pharmacist Practitioner [facility] [phone]

## 2021-11-07 ENCOUNTER — Ambulatory Visit: Payer: BC Managed Care – PPO | Admitting: Pulmonary Disease

## 2021-11-08 ENCOUNTER — Encounter (HOSPITAL_COMMUNITY): Payer: BC Managed Care – PPO

## 2021-11-09 ENCOUNTER — Encounter: Payer: BC Managed Care – PPO | Admitting: Thoracic Surgery (Cardiothoracic Vascular Surgery)

## 2021-11-20 ENCOUNTER — Ambulatory Visit
Admission: RE | Admit: 2021-11-20 | Discharge: 2021-11-20 | Disposition: A | Discharge status: Home / Self Care | Payer: Medicare Other | Source: Ambulatory Visit | Attending: Radiation Oncology | Admitting: Radiation Oncology

## 2021-11-20 DIAGNOSIS — R911 Solitary pulmonary nodule: Secondary | ICD-10-CM | POA: Diagnosis not present

## 2021-11-20 DIAGNOSIS — C3411 Malignant neoplasm of upper lobe, right bronchus or lung: Secondary | ICD-10-CM | POA: Diagnosis not present

## 2021-11-20 DIAGNOSIS — Z87891 Personal history of nicotine dependence: Secondary | ICD-10-CM | POA: Diagnosis not present

## 2021-11-28 DIAGNOSIS — E785 Hyperlipidemia, unspecified: Secondary | ICD-10-CM | POA: Diagnosis not present

## 2021-11-28 DIAGNOSIS — I10 Essential (primary) hypertension: Secondary | ICD-10-CM

## 2021-12-01 DIAGNOSIS — Z87891 Personal history of nicotine dependence: Secondary | ICD-10-CM | POA: Diagnosis not present

## 2021-12-01 DIAGNOSIS — C3411 Malignant neoplasm of upper lobe, right bronchus or lung: Secondary | ICD-10-CM | POA: Diagnosis not present

## 2021-12-01 DIAGNOSIS — R911 Solitary pulmonary nodule: Secondary | ICD-10-CM | POA: Diagnosis not present

## 2021-12-04 ENCOUNTER — Other Ambulatory Visit: Payer: Self-pay

## 2021-12-04 ENCOUNTER — Ambulatory Visit
Admission: RE | Admit: 2021-12-04 | Discharge: 2021-12-04 | Disposition: A | Discharge status: Home / Self Care | Payer: Medicare Other | Source: Ambulatory Visit | Attending: Radiation Oncology | Admitting: Radiation Oncology

## 2021-12-04 DIAGNOSIS — R911 Solitary pulmonary nodule: Secondary | ICD-10-CM

## 2021-12-05 ENCOUNTER — Ambulatory Visit: Payer: Medicare Other | Admitting: Radiation Oncology

## 2021-12-05 ENCOUNTER — Ambulatory Visit (INDEPENDENT_AMBULATORY_CARE_PROVIDER_SITE_OTHER): Payer: Medicare Other | Admitting: Internal Medicine

## 2021-12-05 ENCOUNTER — Encounter: Payer: Self-pay | Admitting: Internal Medicine

## 2021-12-05 VITALS — BP 118/70 | HR 99 | Temp 99.4°F | Ht 66.4 in | Wt 199.2 lb

## 2021-12-05 DIAGNOSIS — Z0000 Encounter for general adult medical examination without abnormal findings: Secondary | ICD-10-CM

## 2021-12-05 DIAGNOSIS — Z6831 Body mass index (BMI) 31.0-31.9, adult: Secondary | ICD-10-CM | POA: Diagnosis not present

## 2021-12-05 DIAGNOSIS — R252 Cramp and spasm: Secondary | ICD-10-CM

## 2021-12-05 DIAGNOSIS — Z79899 Other long term (current) drug therapy: Secondary | ICD-10-CM

## 2021-12-05 DIAGNOSIS — E1165 Type 2 diabetes mellitus with hyperglycemia: Secondary | ICD-10-CM

## 2021-12-05 DIAGNOSIS — M79641 Pain in right hand: Secondary | ICD-10-CM | POA: Diagnosis not present

## 2021-12-05 DIAGNOSIS — E6609 Other obesity due to excess calories: Secondary | ICD-10-CM

## 2021-12-05 DIAGNOSIS — I2510 Atherosclerotic heart disease of native coronary artery without angina pectoris: Secondary | ICD-10-CM | POA: Diagnosis not present

## 2021-12-05 DIAGNOSIS — E7800 Pure hypercholesterolemia, unspecified: Secondary | ICD-10-CM | POA: Diagnosis not present

## 2021-12-05 DIAGNOSIS — E1169 Type 2 diabetes mellitus with other specified complication: Secondary | ICD-10-CM | POA: Diagnosis not present

## 2021-12-05 DIAGNOSIS — I119 Hypertensive heart disease without heart failure: Secondary | ICD-10-CM | POA: Diagnosis not present

## 2021-12-05 DIAGNOSIS — E559 Vitamin D deficiency, unspecified: Secondary | ICD-10-CM

## 2021-12-05 DIAGNOSIS — I129 Hypertensive chronic kidney disease with stage 1 through stage 4 chronic kidney disease, or unspecified chronic kidney disease: Secondary | ICD-10-CM | POA: Diagnosis not present

## 2021-12-05 DIAGNOSIS — C3491 Malignant neoplasm of unspecified part of right bronchus or lung: Secondary | ICD-10-CM | POA: Diagnosis not present

## 2021-12-05 DIAGNOSIS — I700 Atherosclerosis of aorta: Secondary | ICD-10-CM | POA: Diagnosis not present

## 2021-12-05 DIAGNOSIS — E785 Hyperlipidemia, unspecified: Secondary | ICD-10-CM

## 2021-12-05 LAB — POCT URINALYSIS DIPSTICK
Bilirubin, UA: NEGATIVE
Blood, UA: NEGATIVE
Glucose, UA: NEGATIVE
Ketones, UA: NEGATIVE
Leukocytes, UA: NEGATIVE
Nitrite, UA: NEGATIVE
Protein, UA: NEGATIVE
Spec Grav, UA: 1.015 (ref 1.010–1.025)
Urobilinogen, UA: 0.2 E.U./dL
pH, UA: 7 (ref 5.0–8.0)

## 2021-12-05 LAB — POCT UA - MICROALBUMIN
Albumin/Creatinine Ratio, Urine, POC: <30
Creatinine, POC: 200 mg/dL
Microalbumin Ur, POC: 10 mg/L

## 2021-12-05 MED ORDER — BISOPROLOL FUMARATE 5 MG PO TABS: ORAL_TABLET | ORAL | 3 refills | Status: DC

## 2021-12-05 NOTE — Patient Instructions (Signed)

## 2021-12-05 NOTE — Progress Notes (Signed)
I,[name],acting as a Education administrator for [name], MD.,have documented all relevant documentation on the behalf of [name], MD,as directed by  [name], MD while in the presence of [name], MD.  This visit occurred during the SARS-CoV-2 public health emergency.  Safety protocols were in place, including screening questions prior to the visit, additional usage of staff PPE, and extensive cleaning of exam room while observing appropriate contact time as indicated for disinfecting solutions.  Subjective:     Patient ID: [name] , female    DOB: 12/31/1945 , 75 y.o.   MRN: [id number]   Chief Complaint  Patient presents with   Annual Exam   Diabetes   Hypertension    HPI  Patient presents today for HM.  She is no longer followed by GYN. She reports compliance with meds. Denies headaches, chest pain and shortness of breath. She has recently been diagnosed with lung cancer. She had her first radiation treatment yesterday. She did experience extreme nausea afterwards.   Diabetes She presents for her follow-up diabetic visit. She has type 2 diabetes mellitus. Her disease course has been stable. There are no hypoglycemic associated symptoms. Pertinent negatives for diabetes include no blurred vision and no chest pain. There are no hypoglycemic complications. Risk factors for coronary artery disease include diabetes mellitus, dyslipidemia, hypertension, obesity, sedentary lifestyle and post-menopausal. She participates in exercise intermittently. Her breakfast blood glucose is taken between 8-9 am. Her breakfast blood glucose range is generally 110-130 mg/dl. Eye exam is current.  Hypertension This is a chronic problem. The current episode started more than 1 year ago. The problem has been gradually improving since onset. The problem is controlled. Pertinent negatives include no blurred vision, chest pain, palpitations or shortness of breath. The current treatment  provides moderate improvement. Compliance problems include exercise.     Past Medical History:  Diagnosis Date   Arthritis    Asthma    Complication of anesthesia    COPD (chronic obstructive pulmonary disease) (HCC)    Diabetes ([place])    High cholesterol    Hypertension    Lung cancer ([place])    Pneumonia 09/10/2017   PONV (postoperative nausea and vomiting)    Stroke [facility]) 2007   mini strokes     Family History  Problem Relation Age of Onset   Heart murmur Mother    Heart attack Father    Diabetes Father    Diabetes Brother      Current Outpatient Medications:    Accu-Chek FastClix Lancets MISC, Inject 1 each as directed daily. Use as directed to check blood sugars daily dx: e11.22, Disp: 50 each, Rfl: 11   albuterol (VENTOLIN HFA) 108 (90 Base) MCG/ACT inhaler, Inhale 2 puffs into the lungs every 4 (four) hours as needed for wheezing or shortness of breath. (Patient taking differently: Inhale 2 puffs into the lungs every 4 (four) hours as needed for wheezing.), Disp: 18 g, Rfl: 12   aspirin 81 MG tablet, Take 81 mg by mouth daily., Disp: , Rfl:    Biotin 10000 MCG TBDP, Take 10,000 mcg by mouth daily., Disp: , Rfl:    Budeson-Glycopyrrol-Formoterol (BREZTRI AEROSPHERE) 160-9-4.8 MCG/ACT AERO, Inhale 2 puffs into the lungs 2 (two) times daily., Disp: 10.7 g, Rfl: 6   CALCIUM-MAGNESIUM-ZINC PO, Take 1 capsule by mouth at bedtime., Disp: , Rfl:    Cholecalciferol (VITAMIN D3) 2000 UNITS TABS, Take 2,000 Units by mouth at bedtime., Disp: , Rfl:  famotidine (PEPCID) 20 MG tablet, One after supper (Patient taking differently: Take 20 mg by mouth daily with supper.), Disp: 90 tablet, Rfl: 3   gabapentin (NEURONTIN) 300 MG capsule, Take 1 capsule (300 mg total) by mouth 4 (four) times daily. (Patient taking differently: Take 300 mg by mouth 2 (two) times daily.), Disp: 120 capsule, Rfl: 5   glucose blood (ACCU-CHEK GUIDE) test strip, 1 each by Other route daily. Use as instructed to  check blood sugars 1 time per day dx: e11.22, Disp: 50 each, Rfl: 11   ipratropium-albuterol (DUONEB) 0.5-2.5 (3) MG/3ML SOLN, USE 3 ML VIA NEBULIZER EVERY 6 HOURS AS NEEDED, Disp: 180 mL, Rfl: 1   lidocaine (LIDODERM) 5 %, Place 1 patch onto the skin daily as needed (pain). Remove & Discard patch within 12 hours or as directed by MD, Disp: , Rfl:    montelukast (SINGULAIR) 10 MG tablet, TAKE 1 TABLET(10 MG) BY MOUTH AT BEDTIME, Disp: 90 tablet, Rfl: 2   pantoprazole (PROTONIX) 40 MG tablet, TAKE 1 TABLET BY MOUTH 30 MINS BEFORE FIRST MEAL OF THE DAY, Disp: 90 tablet, Rfl: 2   rosuvastatin (CRESTOR) 20 MG tablet, TAKE 1 TABLET(20 MG) BY MOUTH AT BEDTIME, Disp: 90 tablet, Rfl: 1   Semaglutide, 1 MG/DOSE, (OZEMPIC, 1 MG/DOSE,) 4 MG/3ML SOPN, Inject 1 mg into the skin once a week. (Patient taking differently: Inject 1 mg into the skin once a week. Friday), Disp: 3 mL, Rfl: 3   traMADol (ULTRAM) 50 MG tablet, Take 1 tablet (50 mg total) by mouth every 6 (six) hours as needed., Disp: 40 tablet, Rfl: 0   bisoprolol (ZEBETA) 5 MG tablet, 1/2 tab po qd, Disp: 45 tablet, Rfl: 3   Allergies  Allergen Reactions   Other Swelling    HAIR DYE      The patient states she uses status post hysterectomy for birth control. Last LMP was No LMP recorded. Patient has had a hysterectomy.. Negative for Dysmenorrhea. Negative for: breast discharge, breast lump(s), breast pain and breast self exam. Associated symptoms include abnormal vaginal bleeding. Pertinent negatives include abnormal bleeding (hematology), anxiety, decreased libido, depression, difficulty falling sleep, dyspareunia, history of infertility, nocturia, sexual dysfunction, sleep disturbances, urinary incontinence, urinary urgency, vaginal discharge and vaginal itching. Diet regular.The patient states her exercise level is    . The patient's tobacco use is:  Social History   Tobacco Use  Smoking Status Former   Packs/day: 0.50   Years: 42.00   Pack  years: 21.00   Types: Cigarettes   Start date: 01/30/1967   Quit date: 01/30/2006   Years since quitting: 15.9  Smokeless Tobacco Never  Tobacco Comments   intermittent cycles b/w 1/4-1/2ppd  . She has been exposed to passive smoke. The patient's alcohol use is:  Social History   Substance and Sexual Activity  Alcohol Use Not Currently   Comment: 1 drink a year, maybe   Review of Systems  Constitutional: Negative.   HENT: Negative.    Eyes: Negative.  Negative for blurred vision.  Respiratory: Negative.  Negative for shortness of breath.   Cardiovascular: Negative.  Negative for chest pain and palpitations.  Gastrointestinal: Negative.   Endocrine: Negative.   Genitourinary: Negative.   Musculoskeletal:  Positive for arthralgias and myalgias.       She c/o muscle cramps. Unable to determine triggers. Also with r hand pain. No fall/trauma. There is stiffness upon awakening.   Skin: Negative.   Allergic/Immunologic: Negative.   Neurological: Negative.  Hematological: Negative.   Psychiatric/Behavioral: Negative.      Today's Vitals   12/05/21 1438  BP: 118/70  Pulse: 99  Temp: 99.4 F (37.4 C)  Weight: 199 lb 3.2 oz (90.4 kg)  Height: 5' 6.4" (1.687 m)   Body mass index is 31.77 kg/m.  Wt Readings from Last 3 Encounters:  12/05/21 199 lb 3.2 oz (90.4 kg)  10/31/21 200 lb 12.8 oz (91.1 kg)  10/29/21 202 lb (91.6 kg)    BP Readings from Last 3 Encounters:  12/05/21 118/70  10/31/21 (!) 144/59  10/29/21 140/82    Objective:  Physical Exam Vitals and nursing note reviewed.  Constitutional:      Appearance: Normal appearance.  HENT:     Head: Normocephalic and atraumatic.     Right Ear: Tympanic membrane, ear canal and external ear normal.     Left Ear: Tympanic membrane, ear canal and external ear normal.     Nose:     Comments: Masked     Mouth/Throat:     Comments: Masked  Eyes:     Extraocular Movements: Extraocular movements intact.      Conjunctiva/sclera: Conjunctivae normal.     Pupils: Pupils are equal, round, and reactive to light.  Cardiovascular:     Rate and Rhythm: Normal rate and regular rhythm.     Pulses:          Dorsalis pedis pulses are 2+ on the right side and 1+ on the left side.     Heart sounds: Normal heart sounds.  Pulmonary:     Effort: Pulmonary effort is normal.     Breath sounds: Normal breath sounds.  Chest:  Breasts:    Tanner Score is 5.     Right: Normal.     Left: Normal.  Abdominal:     General: Abdomen is flat. Bowel sounds are normal.     Palpations: Abdomen is soft.  Genitourinary:    Comments: deferred Musculoskeletal:        General: Normal range of motion.     Cervical back: Normal range of motion and neck supple.     Comments: Squeeze test neg, able to make fist.   Feet:     Right foot:     Protective Sensation: 5 sites tested.  5 sites sensed.     Skin integrity: Dry skin present.     Toenail Condition: Right toenails are abnormally thick.     Left foot:     Protective Sensation: 5 sites tested.  5 sites sensed.     Skin integrity: Dry skin present.     Toenail Condition: Left toenails are abnormally thick.     Comments: Darkened great toenails b/l Skin:    General: Skin is warm and dry.  Neurological:     General: No focal deficit present.     Mental Status: She is alert and oriented to person, place, and time.  Psychiatric:        Mood and Affect: Mood normal.        Behavior: Behavior normal.        Assessment And Plan:     1. Encounter for general adult medical examination w/o abnormal findings Comments: A full exam was performed. Importance of monthly self breast exams was discussed with the patient.  PATIENT IS ADVISED TO GET 30-45 MINUTES REGULAR EXERCISE NO LESS THAN FOUR TO FIVE DAYS PER WEEK - BOTH WEIGHTBEARING EXERCISES AND AEROBIC ARE RECOMMENDED.  PATIENT IS ADVISED TO FOLLOW  A HEALTHY DIET WITH AT LEAST SIX FRUITS/VEGGIES PER DAY, DECREASE INTAKE OF  RED MEAT, AND TO INCREASE FISH INTAKE TO TWO DAYS PER WEEK.  MEATS/FISH SHOULD NOT BE FRIED, BAKED OR BROILED IS PREFERABLE.  IT IS ALSO IMPORTANT TO CUT BACK ON YOUR SUGAR INTAKE. PLEASE AVOID ANYTHING WITH ADDED SUGAR, CORN SYRUP OR OTHER SWEETENERS. IF YOU MUST USE A SWEETENER, YOU CAN TRY STEVIA. IT IS ALSO IMPORTANT TO AVOID ARTIFICIALLY SWEETENERS AND DIET BEVERAGES. LASTLY, I SUGGEST WEARING SPF 50 SUNSCREEN ON EXPOSED PARTS AND ESPECIALLY WHEN IN THE DIRECT SUNLIGHT FOR AN EXTENDED PERIOD OF TIME.  PLEASE AVOID FAST FOOD RESTAURANTS AND INCREASE YOUR WATER INTAKE.   2. Dyslipidemia associated with type 2 diabetes mellitus ([place]) Comments: Diabetic foot exam was performed. She will f/u in 4 months.  Importance of statin compliance was d/w patient. I DISCUSSED WITH THE PATIENT AT LENGTH REGARDING THE GOALS OF GLYCEMIC CONTROL AND POSSIBLE LONG-TERM COMPLICATIONS.  I  ALSO STRESSED THE IMPORTANCE OF COMPLIANCE WITH HOME GLUCOSE MONITORING, DIETARY RESTRICTIONS INCLUDING AVOIDANCE OF SUGARY DRINKS/PROCESSED FOODS,  ALONG WITH REGULAR EXERCISE.  I  ALSO STRESSED THE IMPORTANCE OF ANNUAL EYE EXAMS, SELF FOOT CARE AND COMPLIANCE WITH OFFICE VISITS.  - CMP14+EGFR - Lipid panel - Hemoglobin A1c - POCT Urinalysis Dipstick (81002) - POCT UA - Microalbumin  3. Hypertensive heart disease without heart failure Comments: Chronic, well controlled. Advised to follow low sodium diet. No med changes. EKG performed, NSR, RAE. She will f/u in 6 months.  - EKG 12-Lead  4. Atherosclerosis of aorta (HCC) Comments: Chronic, importance of statin compliance was d/w patient. Advised to follow heart healthy lifestyle.  5. Adenocarcinoma, lung, right ([place]) Comments: Newly diagnosed, stage 1. Now receiving radiation therapy. Onc/Rad onc input appreciated.  6. Muscle cramps Comments: She is encouraged to stay well hydrated and increase intake of potassium rich foods. If persistent, may benefit from Mg supplementation.   - CK, total  7. Right hand pain Comments: Squeeze test negative. I will check arthritis panel and make further recommendations once labs are available.  - ANA, IFA (with reflex) - Rheumatoid factor - Sedimentation rate - CYCLIC CITRUL PEPTIDE ANTIBODY, IGG/IGA - Uric acid  8. Vitamin D deficiency disease Comments: I will check vitamin D level and supplement as needed.  - Vitamin D (25 hydroxy)  9. Class 1 obesity due to excess calories with serious comorbidity and body mass index (BMI) of 31.0 to 31.9 in adult Comments: She is encouraged to strive for BMI less than 30 to decrease cardiac risk. Advised to aim for at least 150 minutes of exercise per week.   10. Encounter for long-term (current) use of medications  Patient was given opportunity to ask questions. Patient verbalized understanding of the plan and was able to repeat key elements of the plan. All questions were answered to their satisfaction.   I, [name], MD, have reviewed all documentation for this visit. The documentation on 12/05/21 for the exam, diagnosis, procedures, and orders are all accurate and complete.   THE PATIENT IS ENCOURAGED TO PRACTICE SOCIAL DISTANCING DUE TO THE COVID-19 PANDEMIC.

## 2021-12-06 ENCOUNTER — Ambulatory Visit
Admission: RE | Admit: 2021-12-06 | Discharge: 2021-12-06 | Disposition: A | Discharge status: Home / Self Care | Payer: Medicare Other | Source: Ambulatory Visit | Attending: Radiation Oncology | Admitting: Radiation Oncology

## 2021-12-06 ENCOUNTER — Other Ambulatory Visit: Payer: Self-pay

## 2021-12-06 DIAGNOSIS — H40013 Open angle with borderline findings, low risk, bilateral: Secondary | ICD-10-CM | POA: Diagnosis not present

## 2021-12-06 DIAGNOSIS — H0288A Meibomian gland dysfunction right eye, upper and lower eyelids: Secondary | ICD-10-CM | POA: Diagnosis not present

## 2021-12-06 DIAGNOSIS — R911 Solitary pulmonary nodule: Secondary | ICD-10-CM

## 2021-12-06 DIAGNOSIS — H43812 Vitreous degeneration, left eye: Secondary | ICD-10-CM | POA: Diagnosis not present

## 2021-12-06 DIAGNOSIS — E119 Type 2 diabetes mellitus without complications: Secondary | ICD-10-CM | POA: Diagnosis not present

## 2021-12-06 DIAGNOSIS — H1045 Other chronic allergic conjunctivitis: Secondary | ICD-10-CM | POA: Diagnosis not present

## 2021-12-06 DIAGNOSIS — H2513 Age-related nuclear cataract, bilateral: Secondary | ICD-10-CM | POA: Diagnosis not present

## 2021-12-06 DIAGNOSIS — H0288B Meibomian gland dysfunction left eye, upper and lower eyelids: Secondary | ICD-10-CM | POA: Diagnosis not present

## 2021-12-06 LAB — HM DIABETES EYE EXAM

## 2021-12-07 ENCOUNTER — Encounter: Payer: Self-pay | Admitting: Internal Medicine

## 2021-12-08 LAB — CMP14+EGFR
ALT: 17 IU/L (ref 0–32)
AST: 30 IU/L (ref 0–40)
Albumin/Globulin Ratio: 1.5 (ref 1.2–2.2)
Albumin: 4.3 g/dL (ref 3.7–4.7)
Alkaline Phosphatase: 72 IU/L (ref 44–121)
BUN/Creatinine Ratio: 7 — ABNORMAL LOW (ref 12–28)
BUN: 6 mg/dL — ABNORMAL LOW (ref 8–27)
Bilirubin Total: 0.4 mg/dL (ref 0.0–1.2)
CO2: 25 mmol/L (ref 20–29)
Calcium: 9.6 mg/dL (ref 8.7–10.3)
Chloride: 103 mmol/L (ref 96–106)
Creatinine, Ser: 0.91 mg/dL (ref 0.57–1.00)
Globulin, Total: 2.9 g/dL (ref 1.5–4.5)
Glucose: 91 mg/dL (ref 70–99)
Potassium: 4.4 mmol/L (ref 3.5–5.2)
Sodium: 143 mmol/L (ref 134–144)
Total Protein: 7.2 g/dL (ref 6.0–8.5)
eGFR: 66 mL/min/{1.73_m2} (ref >59)

## 2021-12-08 LAB — ANTINUCLEAR ANTIBODIES, IFA: ANA Titer 1: NEGATIVE

## 2021-12-08 LAB — LIPID PANEL
Chol/HDL Ratio: 3.2 ratio (ref 0.0–4.4)
Cholesterol, Total: 159 mg/dL (ref 100–199)
HDL: 49 mg/dL (ref >39)
LDL Chol Calc (NIH): 93 mg/dL (ref 0–99)
Triglycerides: 94 mg/dL (ref 0–149)
VLDL Cholesterol Cal: 17 mg/dL (ref 5–40)

## 2021-12-08 LAB — VITAMIN D 25 HYDROXY (VIT D DEFICIENCY, FRACTURES): Vit D, 25-Hydroxy: 52.7 ng/mL (ref 30.0–100.0)

## 2021-12-08 LAB — HEMOGLOBIN A1C
Est. average glucose Bld gHb Est-mCnc: 151 mg/dL
Hgb A1c MFr Bld: 6.9 % — ABNORMAL HIGH (ref 4.8–5.6)

## 2021-12-08 LAB — SEDIMENTATION RATE: Sed Rate: 40 mm/hr (ref 0–40)

## 2021-12-08 LAB — RHEUMATOID FACTOR: Rheumatoid fact SerPl-aCnc: <10 IU/mL (ref <14.0)

## 2021-12-08 LAB — CK: Total CK: 484 U/L — ABNORMAL HIGH (ref 32–182)

## 2021-12-08 LAB — URIC ACID: Uric Acid: 5.6 mg/dL (ref 3.1–7.9)

## 2021-12-08 LAB — CYCLIC CITRUL PEPTIDE ANTIBODY, IGG/IGA: Cyclic Citrullin Peptide Ab: 20 units — ABNORMAL HIGH (ref 0–19)

## 2021-12-10 ENCOUNTER — Encounter: Payer: Self-pay | Admitting: Internal Medicine

## 2021-12-11 ENCOUNTER — Encounter: Payer: Self-pay | Admitting: Radiation Oncology

## 2021-12-11 ENCOUNTER — Ambulatory Visit
Admission: RE | Admit: 2021-12-11 | Discharge: 2021-12-11 | Disposition: A | Discharge status: Home / Self Care | Payer: Medicare Other | Source: Ambulatory Visit | Attending: Radiation Oncology | Admitting: Radiation Oncology

## 2021-12-11 ENCOUNTER — Other Ambulatory Visit: Payer: Self-pay

## 2021-12-11 DIAGNOSIS — C3411 Malignant neoplasm of upper lobe, right bronchus or lung: Secondary | ICD-10-CM | POA: Diagnosis not present

## 2021-12-11 DIAGNOSIS — Z87891 Personal history of nicotine dependence: Secondary | ICD-10-CM | POA: Diagnosis not present

## 2021-12-11 DIAGNOSIS — R911 Solitary pulmonary nodule: Secondary | ICD-10-CM

## 2021-12-30 ENCOUNTER — Other Ambulatory Visit: Payer: Self-pay | Admitting: Internal Medicine

## 2021-12-31 DIAGNOSIS — I119 Hypertensive heart disease without heart failure: Secondary | ICD-10-CM | POA: Insufficient documentation

## 2022-01-01 ENCOUNTER — Encounter: Payer: Self-pay | Admitting: Internal Medicine

## 2022-01-01 ENCOUNTER — Telehealth: Payer: Self-pay

## 2022-01-01 NOTE — Chronic Care Management (AMB) (Signed)
° ° °  Chronic Care Management Pharmacy Assistant   Name: MATRACA HUNKINS  MRN: [id number] DOB: 04/27/1946  Reason for Encounter: Patient Assistance Coordination   01/01/2022- Patient called regarding Ozempic medication that she gets through [employer] patient assistance program. Patient states she has 2 doses left, 2 more weeks, informed patient that [employer] is currently having a manufacturer back order and shipping delays. Patient aware that I will keep checking the office for shipments and we will talk to each other weekly to determine if we need to get a prescription, samples or change in medications.  01/02/2022- Checked office for patient assistance medication Ozempic, medication has not arrived yet.  01/04/2022- Called patient to inform, patient aware, she is also aware I will check again next week and call her.   01/14/2022- Patient assistance medication has not arrived, patient requested prescription from Dr. Baird [facility] on 01/10/2022 via [place]. Will continue to check office to see if supply has been received.    Medications: Outpatient Encounter Medications as of 01/01/2022  Medication Sig   pantoprazole (PROTONIX) 40 MG tablet TAKE 1 TABLET BY MOUTH 30 MINUTES BEFORE FIRST MEAL OF THE DAY   Accu-Chek FastClix Lancets MISC Inject 1 each as directed daily. Use as directed to check blood sugars daily dx: e11.22   albuterol (VENTOLIN HFA) 108 (90 Base) MCG/ACT inhaler Inhale 2 puffs into the lungs every 4 (four) hours as needed for wheezing or shortness of breath. (Patient taking differently: Inhale 2 puffs into the lungs every 4 (four) hours as needed for wheezing.)   aspirin 81 MG tablet Take 81 mg by mouth daily.   Biotin 10000 MCG TBDP Take 10,000 mcg by mouth daily.   bisoprolol (ZEBETA) 5 MG tablet 1/2 tab po qd   Budeson-Glycopyrrol-Formoterol (BREZTRI AEROSPHERE) 160-9-4.8 MCG/ACT AERO Inhale 2 puffs into the lungs 2 (two) times daily.   CALCIUM-MAGNESIUM-ZINC PO Take 1 capsule by  mouth at bedtime.   Cholecalciferol (VITAMIN D3) 2000 UNITS TABS Take 2,000 Units by mouth at bedtime.   famotidine (PEPCID) 20 MG tablet One after supper (Patient taking differently: Take 20 mg by mouth daily with supper.)   gabapentin (NEURONTIN) 300 MG capsule Take 1 capsule (300 mg total) by mouth 4 (four) times daily. (Patient taking differently: Take 300 mg by mouth 2 (two) times daily.)   glucose blood (ACCU-CHEK GUIDE) test strip 1 each by Other route daily. Use as instructed to check blood sugars 1 time per day dx: e11.22   ipratropium-albuterol (DUONEB) 0.5-2.5 (3) MG/3ML SOLN USE 3 ML VIA NEBULIZER EVERY 6 HOURS AS NEEDED   lidocaine (LIDODERM) 5 % Place 1 patch onto the skin daily as needed (pain). Remove & Discard patch within 12 hours or as directed by MD   montelukast (SINGULAIR) 10 MG tablet TAKE 1 TABLET(10 MG) BY MOUTH AT BEDTIME   rosuvastatin (CRESTOR) 20 MG tablet TAKE 1 TABLET(20 MG) BY MOUTH AT BEDTIME   Semaglutide, 1 MG/DOSE, (OZEMPIC, 1 MG/DOSE,) 4 MG/3ML SOPN Inject 1 mg into the skin once a week. (Patient taking differently: Inject 1 mg into the skin once a week. Friday)   traMADol (ULTRAM) 50 MG tablet Take 1 tablet (50 mg total) by mouth every 6 (six) hours as needed.   No facility-administered encounter medications on file as of 01/01/2022.   Pattricia Boss, [place] Pharmacist Assistant [phone]

## 2022-01-10 ENCOUNTER — Encounter: Payer: Self-pay | Admitting: Internal Medicine

## 2022-01-10 ENCOUNTER — Other Ambulatory Visit: Payer: Self-pay

## 2022-01-10 MED ORDER — OZEMPIC (1 MG/DOSE) 4 MG/3ML ~~LOC~~ SOPN: 1.0000 mg | PEN_INJECTOR | SUBCUTANEOUS | 1 refills | Status: DC

## 2022-01-16 ENCOUNTER — Encounter: Payer: Self-pay | Admitting: Radiation Oncology

## 2022-01-16 ENCOUNTER — Telehealth: Payer: Self-pay

## 2022-01-16 NOTE — Chronic Care Management (AMB) (Signed)
° ° °  Chronic Care Management Pharmacy Assistant   Name: [name]  MRN: [id number] DOB: [birth year]  Reason for Encounter: Patient Assistance Coordination  Patient called 01/14/2022 stating she received a letter from [employer] patient assistance program that there was information missing on her application. Patient aware I was unavaialble to speak at that time but will be at PCP office tomorrow to review her application and refax.  8/63/8177- Application reviewed, missing medication list and allergies. Attached missing information and refaxed application back to [employer] patient assistance program. Called patient, she is aware. Patient also mentioned that she received a letter from [employer] that she was approved for the 2023 enrollment year. Patient aware her medication has not arrived to PCP office as of yet but I will keep checking.   Medications: Outpatient Encounter Medications as of 01/16/2022  Medication Sig   pantoprazole (PROTONIX) 40 MG tablet TAKE 1 TABLET BY MOUTH 30 MINUTES BEFORE FIRST MEAL OF THE DAY   Accu-Chek FastClix Lancets MISC Inject 1 each as directed daily. Use as directed to check blood sugars daily dx: e11.22   albuterol (VENTOLIN HFA) 108 (90 Base) MCG/ACT inhaler Inhale 2 puffs into the lungs every 4 (four) hours as needed for wheezing or shortness of breath. (Patient taking differently: Inhale 2 puffs into the lungs every 4 (four) hours as needed for wheezing.)   aspirin 81 MG tablet Take 81 mg by mouth daily.   Biotin 10000 MCG TBDP Take 10,000 mcg by mouth daily.   bisoprolol (ZEBETA) 5 MG tablet 1/2 tab po qd   Budeson-Glycopyrrol-Formoterol (BREZTRI AEROSPHERE) 160-9-4.8 MCG/ACT AERO Inhale 2 puffs into the lungs 2 (two) times daily.   CALCIUM-MAGNESIUM-ZINC PO Take 1 capsule by mouth at bedtime.   Cholecalciferol (VITAMIN D3) 2000 UNITS TABS Take 2,000 Units by mouth at bedtime.   famotidine (PEPCID) 20 MG tablet One after supper (Patient taking  differently: Take 20 mg by mouth daily with supper.)   gabapentin (NEURONTIN) 300 MG capsule Take 1 capsule (300 mg total) by mouth 4 (four) times daily. (Patient taking differently: Take 300 mg by mouth 2 (two) times daily.)   glucose blood (ACCU-CHEK GUIDE) test strip 1 each by Other route daily. Use as instructed to check blood sugars 1 time per day dx: e11.22   ipratropium-albuterol (DUONEB) 0.5-2.5 (3) MG/3ML SOLN USE 3 ML VIA NEBULIZER EVERY 6 HOURS AS NEEDED   lidocaine (LIDODERM) 5 % Place 1 patch onto the skin daily as needed (pain). Remove & Discard patch within 12 hours or as directed by MD   montelukast (SINGULAIR) 10 MG tablet TAKE 1 TABLET(10 MG) BY MOUTH AT BEDTIME   rosuvastatin (CRESTOR) 20 MG tablet TAKE 1 TABLET(20 MG) BY MOUTH AT BEDTIME   Semaglutide, 1 MG/DOSE, (OZEMPIC, 1 MG/DOSE,) 4 MG/3ML SOPN Inject 1 mg into the skin once a week. Friday   traMADol (ULTRAM) 50 MG tablet Take 1 tablet (50 mg total) by mouth every 6 (six) hours as needed.   No facility-administered encounter medications on file as of 01/16/2022.    Pattricia Boss, [place] Pharmacist Assistant [phone]

## 2022-01-20 NOTE — Progress Notes (Incomplete)
°  Radiation Oncology         [phone] ________________________________  Patient Name: [name] MRN: [id number] DOB: 1946-05-16 Referring Physician: June Leap Date of Service: 12/11/2021 [facility] [employer]                                                        End Of Treatment Note  Diagnoses: C34.11-Malignant neoplasm of upper lobe, right bronchus or lung  Cancer Staging: Adenocarcinoma - RUL pulmonary nodule, clinical stage I  Intent: Curative  Radiation Treatment Dates: 12/04/2021 through 12/11/2021 Site Technique Total Dose (Gy) Dose per Fx (Gy) Completed Fx Beam Energies  Lung, Right: Lung_Rt IMRT 54/54 18 3/3 [id number]   Narrative: The patient tolerated radiation therapy relatively well. She reports  severe pain to right upper back rated at a 10/10, fatigue, continued SOB and cough. Oh physical examination, the right upper back shows some mild tenderness with palpation, but she denied significant pain with palpation.  Plan: Her pain in the right upper back seems to be muscle related.  She is seeing her primary care physician concerning this and was noted to have elevated creatinine kinase. Overall, she has tolerated her RT well without any significant side effects.   The patient will follow-up with radiation oncology in one month. Plan on attaining a chest CT scan in 4 months to evaluate her response to SBRT.   ________________________________________________ -----------------------------------  Blair Promise, PhD, MD  This document serves as a record of services personally performed by Gery Pray, MD. It was created on his behalf by Roney Mans, a trained medical scribe. The creation of this record is based on the scribe's personal observations and the provider's statements to them. This document has been checked and approved by the attending provider.

## 2022-01-21 ENCOUNTER — Ambulatory Visit
Admission: RE | Admit: 2022-01-21 | Discharge: 2022-01-21 | Disposition: A | Discharge status: Home / Self Care | Payer: Medicare PPO | Source: Ambulatory Visit | Attending: Radiation Oncology | Admitting: Radiation Oncology

## 2022-01-21 ENCOUNTER — Other Ambulatory Visit: Payer: Self-pay

## 2022-01-21 ENCOUNTER — Encounter: Payer: Self-pay | Admitting: Radiation Oncology

## 2022-01-21 VITALS — BP 150/72 | HR 87 | Temp 97.8°F | Resp 20 | Ht 65.0 in | Wt 200.2 lb

## 2022-01-21 DIAGNOSIS — R911 Solitary pulmonary nodule: Secondary | ICD-10-CM

## 2022-01-21 HISTORY — DX: Personal history of irradiation: Z92.3

## 2022-01-21 NOTE — Progress Notes (Signed)
[name] is here today for follow up post radiation to the lung.  Lung Side: right  Completed radiation treatment on: 12/11/2021  Does the patient complain of any of the following: Pain:7-10/10 back pain occasionally radiating to bilateral rib cage unrelieved with heat Shortness of breath w/wo exertion: shortness of breath with activity has worsened Cough: cough with occasional white phlegm Hemoptysis: denies Pain with swallowing: denies Swallowing/choking concerns: occasional tickle with dry heaves Appetite: poor Energy Level: moderate fatigue Post radiation skin Changes: "black and blue place on my (right) breast like a bruise"    Additional comments if applicable: none  Vitals:   01/21/22 1601  BP: (!) 150/72  Pulse: 87  Resp: 20  Temp: 97.8 F (36.6 C)  SpO2: 99%  Weight: 200 lb 3.2 oz (90.8 kg)  Height: 5\' 5"  (1.651 m)

## 2022-01-21 NOTE — Progress Notes (Signed)
Radiation Oncology         ([phone]) [phone] ________________________________  Name: [name] MRN: [id number]  Date: 01/21/2022  DOB: 1946/07/11  Follow-Up Visit Note  CC: [name], MD  Garner Nash, DO    ICD-10-CM   1. Solitary pulmonary nodule on lung CT  R91.1       Diagnosis: Adenocarcinoma - RUL pulmonary nodule, clinical stage I  Interval Since Last Radiation: 1 month and 10 days   Intent: Curative  Radiation Treatment Dates: 12/04/2021 through 12/11/2021 Site Technique Total Dose (Gy) Dose per Fx (Gy) Completed Fx Beam Energies  Lung, Right: Lung_Rt IMRT 54/54 18 3/3 [id number]    Narrative:  The patient returns today for routine follow-up.  The patient tolerated radiation therapy relatively well. She reported severe pain to her right upper back rated at a 10/10, fatigue, continued SOB and cough. Physical examination performed during her final weekly treatment check revealed the right upper back to show some mild tenderness with palpation, but she denied any significant pain with palpation. Her pain in the right upper back seemed to be muscle related.  She informed me that she saw her primary care physician concerning this and was told to have elevated creatinine kinase. Per a telephone conversation with her PCP on 12/10/21, the patient was instructed to take CoQ10, magnesium, and vitamin D to help with this. She has had no relief of symptoms with this regiment.   Otherwise, no significant interval history since the patient was last seen for consultation in November.   She continues to be fatigued at this point but there is some improvement.  She denies any significant cough or hemoptysis.  She feels little more short winded when going up and down steps.  We discussed the creatinine kinase findings with this study suggesting some type of muscle injury but she denies any trauma to her chest.  She has noticed black and blue discoloration along her right breast but denies  hitting this area or running into anything with her right breast.                                Allergies:  is allergic to other.  Meds: Current Outpatient Medications  Medication Sig Dispense Refill   albuterol (VENTOLIN HFA) 108 (90 Base) MCG/ACT inhaler Inhale 2 puffs into the lungs every 4 (four) hours as needed for wheezing or shortness of breath. (Patient taking differently: Inhale 2 puffs into the lungs every 4 (four) hours as needed for wheezing.) 18 g 12   aspirin 81 MG tablet Take 81 mg by mouth daily.     Biotin 10000 MCG TBDP Take 10,000 mcg by mouth daily.     bisoprolol (ZEBETA) 5 MG tablet 1/2 tab po qd 45 tablet 3   Budeson-Glycopyrrol-Formoterol (BREZTRI AEROSPHERE) 160-9-4.8 MCG/ACT AERO Inhale 2 puffs into the lungs 2 (two) times daily. 10.7 g 6   CALCIUM-MAGNESIUM-ZINC PO Take 1 capsule by mouth at bedtime.     Cholecalciferol (VITAMIN D3) 2000 UNITS TABS Take 2,000 Units by mouth at bedtime.     Coenzyme Q10 30 MG CHEW Chew 60 mg by mouth daily.     famotidine (PEPCID) 20 MG tablet One after supper (Patient taking differently: Take 20 mg by mouth daily with supper.) 90 tablet 3   gabapentin (NEURONTIN) 300 MG capsule Take 1 capsule (300 mg total) by mouth 4 (four) times daily. (Patient taking differently: Take  300 mg by mouth 2 (two) times daily.) 120 capsule 5   ipratropium-albuterol (DUONEB) 0.5-2.5 (3) MG/3ML SOLN USE 3 ML VIA NEBULIZER EVERY 6 HOURS AS NEEDED 180 mL 1   lidocaine (LIDODERM) 5 % Place 1 patch onto the skin daily as needed (pain). Remove & Discard patch within 12 hours or as directed by MD     Magnesium 250 MG TABS      montelukast (SINGULAIR) 10 MG tablet TAKE 1 TABLET(10 MG) BY MOUTH AT BEDTIME 90 tablet 2   pantoprazole (PROTONIX) 40 MG tablet TAKE 1 TABLET BY MOUTH 30 MINUTES BEFORE FIRST MEAL OF THE DAY 90 tablet 2   rosuvastatin (CRESTOR) 20 MG tablet TAKE 1 TABLET(20 MG) BY MOUTH AT BEDTIME 90 tablet 1   Semaglutide, 1 MG/DOSE, (OZEMPIC, 1  MG/DOSE,) 4 MG/3ML SOPN Inject 1 mg into the skin once a week. Friday 3 mL 1   traMADol (ULTRAM) 50 MG tablet Take 1 tablet (50 mg total) by mouth every 6 (six) hours as needed. 40 tablet 0   Accu-Chek FastClix Lancets MISC Inject 1 each as directed daily. Use as directed to check blood sugars daily dx: e11.22 (Patient not taking: Reported on 01/21/2022) 50 each 11   glucose blood (ACCU-CHEK GUIDE) test strip 1 each by Other route daily. Use as instructed to check blood sugars 1 time per day dx: e11.22 (Patient not taking: Reported on 01/21/2022) 50 each 11   No current facility-administered medications for this encounter.    Physical Findings: The patient is in no acute distress. Patient is alert and oriented.  height is 5\' 5"  (1.651 m) and weight is 200 lb 3.2 oz (90.8 kg). Her temperature is 97.8 F (36.6 C). Her blood pressure is 150/72 (abnormal) and her pulse is 87. Her respiration is 20 and oxygen saturation is 99%. .   Lungs are clear to auscultation bilaterally. Heart has regular rate and rhythm. No palpable cervical, supraclavicular, or axillary adenopathy. Abdomen soft, non-tender, normal bowel sounds.  Palpation along the right pain.  We discussed that this area area where the patient reports pain reveals no palpable mass or swelling.  Only mild tenderness with palpation.  Examination of the right breast reveals some mild darkening along the anterior portion of the breast consistent with the bruise.  No significant hematoma in this area or swelling noted really just of mild skin changes extending over approximately 1 and half centimeters.  No dominant mass appreciated in the right breast nipple discharge or bleeding.   Lab Findings: Lab Results  Component Value Date   WBC 5.1 10/23/2021   HGB 12.3 10/23/2021   HCT 39.0 10/23/2021   MCV 89.9 10/23/2021   PLT 345 10/23/2021    Radiographic Findings: No results found.  Impression:  Adenocarcinoma - RUL pulmonary nodule, clinical  stage I  The patient is recovering from the effects of radiation.  She continues to have some fatigue.  She denies any significant coughing at this time.  Possibly some mild worsening of her breathing.  She will speak with her primary care physician to possibly obtain muscle relaxant to see if this may help help with her back pain.  We discussed that this would not be related to her radiation therapy as her tumor was deep inside the chest region and no significant dose to the chest wall or rib cage area.  Plan: Patient will undergo a chest CT scan in 3 months to follow-up on her right upper lobe pulmonary nodule and  SBRT.  The patient will be seen a few days after this CT scan is complete for physical exam and to review results of chest CT scan.   ____________________________________  Blair Promise, PhD, MD  This document serves as a record of services personally performed by Gery Pray, MD. It was created on his behalf by Roney Mans, a trained medical scribe. The creation of this record is based on the scribe's personal observations and the provider's statements to them. This document has been checked and approved by the attending provider.

## 2022-01-22 ENCOUNTER — Telehealth: Payer: Self-pay

## 2022-01-22 ENCOUNTER — Telehealth: Payer: Self-pay | Admitting: *Deleted

## 2022-01-22 ENCOUNTER — Encounter: Payer: Self-pay | Admitting: Internal Medicine

## 2022-01-22 NOTE — Telephone Encounter (Signed)
CALLED PATIENT AND LVM REGARDING FU APPT. IN April WITH DR. Sondra Come

## 2022-01-22 NOTE — Chronic Care Management (AMB) (Signed)
Chronic Care Management Pharmacy Assistant   Name: [name]  MRN: [id number] DOB: 04-Aug-1946  Reason for Encounter: Disease State/ Diabetes   Recent office visits:  12-05-2021 [name], MD. CK total= 484. BUN= 6, BUN/Creatinine= 7. Cyclic Citrullin Peptide Ab= 20. A1C= 6.9. EKG abnormal.  Recent consult visits:  01-21-2022 Gery Pray, MD [facility]). CT Chest W/O contrast ordered.  12-11-2021 Gery Pray, MD [facility]). SBRT radiation.  12-04-2021 Gery Pray, MD [facility]). SBRT radiation.  11-20-2021 Gery Pray, MD [facility]). SBRT CT simulation.  Hospital visits:  None in previous 6 months  Medications: Outpatient Encounter Medications as of 01/22/2022  Medication Sig   Accu-Chek FastClix Lancets MISC Inject 1 each as directed daily. Use as directed to check blood sugars daily dx: e11.22 (Patient not taking: Reported on 01/21/2022)   albuterol (VENTOLIN HFA) 108 (90 Base) MCG/ACT inhaler Inhale 2 puffs into the lungs every 4 (four) hours as needed for wheezing or shortness of breath. (Patient taking differently: Inhale 2 puffs into the lungs every 4 (four) hours as needed for wheezing.)   aspirin 81 MG tablet Take 81 mg by mouth daily.   Biotin 10000 MCG TBDP Take 10,000 mcg by mouth daily.   bisoprolol (ZEBETA) 5 MG tablet 1/2 tab po qd   Budeson-Glycopyrrol-Formoterol (BREZTRI AEROSPHERE) 160-9-4.8 MCG/ACT AERO Inhale 2 puffs into the lungs 2 (two) times daily.   CALCIUM-MAGNESIUM-ZINC PO Take 1 capsule by mouth at bedtime.   Cholecalciferol (VITAMIN D3) 2000 UNITS TABS Take 2,000 Units by mouth at bedtime.   Coenzyme Q10 30 MG CHEW Chew 60 mg by mouth daily.   famotidine (PEPCID) 20 MG tablet One after supper (Patient taking differently: Take 20 mg by mouth daily with supper.)   gabapentin (NEURONTIN) 300 MG capsule Take 1 capsule (300 mg total) by mouth 4 (four) times daily. (Patient taking differently: Take 300 mg by mouth 2  (two) times daily.)   glucose blood (ACCU-CHEK GUIDE) test strip 1 each by Other route daily. Use as instructed to check blood sugars 1 time per day dx: e11.22 (Patient not taking: Reported on 01/21/2022)   ipratropium-albuterol (DUONEB) 0.5-2.5 (3) MG/3ML SOLN USE 3 ML VIA NEBULIZER EVERY 6 HOURS AS NEEDED   lidocaine (LIDODERM) 5 % Place 1 patch onto the skin daily as needed (pain). Remove & Discard patch within 12 hours or as directed by MD   Magnesium 250 MG TABS    montelukast (SINGULAIR) 10 MG tablet TAKE 1 TABLET(10 MG) BY MOUTH AT BEDTIME   pantoprazole (PROTONIX) 40 MG tablet TAKE 1 TABLET BY MOUTH 30 MINUTES BEFORE FIRST MEAL OF THE DAY   rosuvastatin (CRESTOR) 20 MG tablet TAKE 1 TABLET(20 MG) BY MOUTH AT BEDTIME   Semaglutide, 1 MG/DOSE, (OZEMPIC, 1 MG/DOSE,) 4 MG/3ML SOPN Inject 1 mg into the skin once a week. Friday   traMADol (ULTRAM) 50 MG tablet Take 1 tablet (50 mg total) by mouth every 6 (six) hours as needed.   No facility-administered encounter medications on file as of 01/22/2022.   Recent Relevant Labs: Lab Results  Component Value Date/Time   HGBA1C 6.9 (H) 12/05/2021 03:46 PM   HGBA1C 7.0 (H) 09/19/2021 11:09 AM   MICROALBUR 10 12/05/2021 05:03 PM   MICROALBUR 10 09/06/2020 12:54 PM    Kidney Function Lab Results  Component Value Date/Time   CREATININE 0.91 12/05/2021 03:46 PM   CREATININE 0.87 10/23/2021 08:14 AM   GFRNONAA >60 10/23/2021 08:14 AM   GFRAA 81 01/04/2021  02:50 PM    Current antihyperglycemic regimen:  Ozempic 1 mg weekly  What recent interventions/DTPs have been made to improve glycemic control:  Educated on A1c and blood sugar goals; Prevention and management of hypoglycemic episodes; Benefits of routine self-monitoring of blood sugar; Carbohydrate counting and/or plate method -Counseled to check feet daily and get yearly eye exams -Recommended to continue current medication   Have there been any recent hospitalizations or ED visits  since last visit with CPP? No  Patient denies hypoglycemic symptoms  Patient denies hyperglycemic symptoms  How often are you checking your blood sugar? Patient stated rarely  What are your blood sugars ranging? None to report  During the week, how often does your blood glucose drop below 70? Patient stated never to her knowledge.  Are you checking your feet daily/regularly? Patient stated daily  Adherence Review: Is the patient currently on a STATIN medication? Yes Is the patient currently on ACE/ARB medication? No Does the patient have >5 day gap between last estimated fill dates? No  NOTES: Patient stated she has not heard anything about her Ozempic with [facility]. Called [facility] and was informed that information was missing on application. Automated system didn't specify what information was missing. Updated Pattricia Boss PTM.  Care Gaps: Covid booster overdue AWV 10-03-2022  Star Rating Drugs: Ozempic 1 mg- Patient assistance Rosuvastatin 20 mg- Last filled 01-21-2022 [address] [place] Clinical Pharmacist Assistant [phone]

## 2022-01-23 ENCOUNTER — Other Ambulatory Visit: Payer: Self-pay | Admitting: Internal Medicine

## 2022-01-23 ENCOUNTER — Encounter: Payer: Self-pay | Admitting: Internal Medicine

## 2022-01-23 MED ORDER — BACLOFEN 10 MG PO TABS: 10.0000 mg | ORAL_TABLET | Freq: Two times a day (BID) | ORAL | 0 refills | Status: DC | PRN

## 2022-01-23 MED ORDER — FAMOTIDINE 20 MG PO TABS: ORAL_TABLET | ORAL | 1 refills | Status: DC

## 2022-01-24 ENCOUNTER — Encounter: Payer: Self-pay | Admitting: Internal Medicine

## 2022-01-24 ENCOUNTER — Other Ambulatory Visit: Payer: Self-pay

## 2022-01-24 MED ORDER — BACLOFEN 10 MG PO TABS: 10.0000 mg | ORAL_TABLET | Freq: Two times a day (BID) | ORAL | 0 refills | Status: DC | PRN

## 2022-01-24 MED ORDER — FAMOTIDINE 20 MG PO TABS: ORAL_TABLET | ORAL | 1 refills | Status: DC

## 2022-01-28 ENCOUNTER — Encounter: Payer: Self-pay | Admitting: Internal Medicine

## 2022-01-28 ENCOUNTER — Other Ambulatory Visit: Payer: Self-pay | Admitting: Internal Medicine

## 2022-01-28 MED ORDER — HYDROCODONE-ACETAMINOPHEN 5-325MG PREPACK (~~LOC~~: ORAL_TABLET | ORAL | 0 refills | Status: DC

## 2022-01-29 ENCOUNTER — Other Ambulatory Visit: Payer: Self-pay | Admitting: Internal Medicine

## 2022-01-29 ENCOUNTER — Encounter: Payer: Self-pay | Admitting: Internal Medicine

## 2022-01-29 MED ORDER — HYDROCODONE-ACETAMINOPHEN 5-325 MG PO TABS: 1.0000 | ORAL_TABLET | Freq: Four times a day (QID) | ORAL | 0 refills | Status: DC | PRN

## 2022-02-13 ENCOUNTER — Encounter: Payer: Self-pay | Admitting: Internal Medicine

## 2022-02-15 ENCOUNTER — Encounter: Payer: Self-pay | Admitting: Internal Medicine

## 2022-02-15 LAB — HM MAMMOGRAPHY

## 2022-02-18 ENCOUNTER — Encounter: Payer: Self-pay | Admitting: Internal Medicine

## 2022-02-21 ENCOUNTER — Encounter: Payer: Self-pay | Admitting: Internal Medicine

## 2022-02-21 ENCOUNTER — Telehealth: Payer: Self-pay

## 2022-02-21 NOTE — Telephone Encounter (Signed)
Spoke with pt about message sent through [facility] on 02/21/2021. Pt aware, appointment needed before any referral put in/ processed upon her request. Pt notified sch at this moment for both providers are at capacity. Pt told she will be called if any cancellations.

## 2022-02-25 ENCOUNTER — Encounter: Payer: Self-pay | Admitting: Internal Medicine

## 2022-02-26 ENCOUNTER — Encounter: Payer: Self-pay | Admitting: Internal Medicine

## 2022-02-26 ENCOUNTER — Other Ambulatory Visit: Payer: Self-pay

## 2022-02-26 DIAGNOSIS — M791 Myalgia, unspecified site: Secondary | ICD-10-CM

## 2022-02-27 ENCOUNTER — Encounter: Payer: Self-pay | Admitting: Internal Medicine

## 2022-02-27 ENCOUNTER — Telehealth: Payer: Self-pay

## 2022-02-27 ENCOUNTER — Telehealth: Payer: Medicare PPO

## 2022-02-27 NOTE — Chronic Care Management (AMB) (Signed)
? ? ?Chronic Care Management ?Pharmacy Assistant  ? ?Name: [name]  MRN: [id number] DOB: 28-Apr-1946 ? ? ?Reason for Encounter: Disease State/ Hypertension ? ?Recent office visits:  ?None ? ?Recent consult visits:  ?None ? ?Hospital visits:  ?None in previous 6 months ? ?Medications: ?Outpatient Encounter Medications as of 02/27/2022  ?Medication Sig  ? HYDROcodone-acetaminophen (NORCO/VICODIN) 5-325 MG tablet Take 1 tablet by mouth every 6 (six) hours as needed for moderate pain.  ? Accu-Chek FastClix Lancets MISC Inject 1 each as directed daily. Use as directed to check blood sugars daily dx: e11.22 (Patient not taking: Reported on 01/21/2022)  ? albuterol (VENTOLIN HFA) 108 (90 Base) MCG/ACT inhaler Inhale 2 puffs into the lungs every 4 (four) hours as needed for wheezing or shortness of breath. (Patient taking differently: Inhale 2 puffs into the lungs every 4 (four) hours as needed for wheezing.)  ? aspirin 81 MG tablet Take 81 mg by mouth daily.  ? baclofen (LIORESAL) 10 MG tablet Take 1 tablet (10 mg total) by mouth 2 (two) times daily as needed for muscle spasms.  ? Biotin 10000 MCG TBDP Take 10,000 mcg by mouth daily.  ? bisoprolol (ZEBETA) 5 MG tablet 1/2 tab po qd  ? Budeson-Glycopyrrol-Formoterol (BREZTRI AEROSPHERE) 160-9-4.8 MCG/ACT AERO Inhale 2 puffs into the lungs 2 (two) times daily.  ? CALCIUM-MAGNESIUM-ZINC PO Take 1 capsule by mouth at bedtime.  ? Cholecalciferol (VITAMIN D3) 2000 UNITS TABS Take 2,000 Units by mouth at bedtime.  ? Coenzyme Q10 30 MG CHEW Chew 60 mg by mouth daily.  ? famotidine (PEPCID) 20 MG tablet One after supper  ? gabapentin (NEURONTIN) 300 MG capsule Take 1 capsule (300 mg total) by mouth 4 (four) times daily. (Patient taking differently: Take 300 mg by mouth 2 (two) times daily.)  ? glucose blood (ACCU-CHEK GUIDE) test strip 1 each by Other route daily. Use as instructed to check blood sugars 1 time per day dx: e11.22 (Patient not taking: Reported on 01/21/2022)  ?  ipratropium-albuterol (DUONEB) 0.5-2.5 (3) MG/3ML SOLN USE 3 ML VIA NEBULIZER EVERY 6 HOURS AS NEEDED  ? lidocaine (LIDODERM) 5 % Place 1 patch onto the skin daily as needed (pain). Remove & Discard patch within 12 hours or as directed by MD  ? Magnesium 250 MG TABS   ? montelukast (SINGULAIR) 10 MG tablet TAKE 1 TABLET(10 MG) BY MOUTH AT BEDTIME  ? pantoprazole (PROTONIX) 40 MG tablet TAKE 1 TABLET BY MOUTH 30 MINUTES BEFORE FIRST MEAL OF THE DAY  ? rosuvastatin (CRESTOR) 20 MG tablet TAKE 1 TABLET(20 MG) BY MOUTH AT BEDTIME  ? Semaglutide, 1 MG/DOSE, (OZEMPIC, 1 MG/DOSE,) 4 MG/3ML SOPN Inject 1 mg into the skin once a week. Friday  ? ?No facility-administered encounter medications on file as of 02/27/2022.  ?Reviewed chart prior to disease state call. Spoke with patient regarding BP ? ?Recent Office Vitals: ?BP Readings from Last 3 Encounters:  ?01/21/22 (!) 150/72  ?12/05/21 118/70  ?10/31/21 (!) 144/59  ? ?Pulse Readings from Last 3 Encounters:  ?01/21/22 87  ?12/05/21 99  ?10/31/21 (!) 102  ?  ?Wt Readings from Last 3 Encounters:  ?01/21/22 200 lb 3.2 oz (90.8 kg)  ?12/05/21 199 lb 3.2 oz (90.4 kg)  ?10/31/21 200 lb 12.8 oz (91.1 kg)  ?  ? ?Kidney Function ?Lab Results  ?Component Value Date/Time  ? CREATININE 0.91 12/05/2021 03:46 PM  ? CREATININE 0.87 10/23/2021 08:14 AM  ? GFRNONAA >60 10/23/2021 08:14 AM  ? GFRAA 81  01/04/2021 02:50 PM  ? ? ?BMP Latest Ref Rng & Units 12/05/2021 10/23/2021 09/19/2021  ?Glucose 70 - 99 mg/dL 91 152(H) 73  ?BUN 8 - 27 mg/dL 6(L) <5(L) 5(L)  ?Creatinine 0.57 - 1.00 mg/dL 0.91 0.87 0.82  ?BUN/Creat Ratio 12 - 28 7(L) - 6(L)  ?Sodium 134 - 144 mmol/L 143 139 141  ?Potassium 3.5 - 5.2 mmol/L 4.4 4.2 4.7  ?Chloride 96 - 106 mmol/L 103 102 102  ?CO2 20 - 29 mmol/L 25 28 25   ?Calcium 8.7 - 10.3 mg/dL 9.6 9.2 9.5  ? ? ?Current antihypertensive regimen:  ?Bisoprolol 5 mg daily ? ?How often are you checking your Blood Pressure? Patient stated she doesn't check blood pressure ? ?Current  home BP readings: None ? ?What recent interventions/DTPs have been made by any provider to improve Blood Pressure control since last CPP Visit:  ?Educated on BP goals and benefits of medications for prevention of heart attack, stroke and kidney damage; ?Importance of home blood pressure monitoring; ?Proper BP monitoring technique; ?-Counseled to monitor BP at home periodically, document, and provide log at future appointments ? ?Any recent hospitalizations or ED visits since last visit with CPP? No ? ?What diet changes have been made to improve Blood Pressure Control?  ?Patient stated she was recently diagnosed with lung cancer and doesn't have an appetite . ? ?What exercise is being done to improve your Blood Pressure Control?  ?Patient stated she has too much nerve pain to do anything.  ? ?Adherence Review: ?Is the patient currently on ACE/ARB medication? No ?Does the patient have >5 day gap between last estimated fill dates? No ? ?NOTES: ?Patient stated she hasn't been checking blood pressure or blood sugar since being diagnosed with cancer. Patient stated she still hasn't heard anything from [place] with Ozempic. Contacted [facility] and was told all information was received but patient checked the private insurance box by mistake. Application was approved and processed will take up to 6 weeks. Requsted a call back with voucher information on 02-28-2022 early morning. ? ?02-28-2022: Obtained voucher. Evalina Field CMA a rx request to [facility]. Patient is aware and is thankful for all the help. ?BIN [id number] ?PCN [id number] ?GP [id number] ?ID [id number] ? ?03-01-2022: Contacted [facility] with voucher. Medication will be filled and patient is aware. ? ?Care Gaps: ?Covid booster overdue ?AWV 10-03-2022 ? ?Star Rating Drugs: ?Ozempic 1 mg- Patient assistance ?Rosuvastatin 20 mg- Last filled 01-21-2022 90 DS [facility] ? ?Malecca Hicks CMA ?Clinical Pharmacist Assistant ?[phone] ? ?

## 2022-02-28 ENCOUNTER — Other Ambulatory Visit: Payer: Self-pay

## 2022-02-28 MED ORDER — OZEMPIC (1 MG/DOSE) 4 MG/3ML ~~LOC~~ SOPN: 1.0000 mg | PEN_INJECTOR | SUBCUTANEOUS | 1 refills | Status: DC

## 2022-03-04 ENCOUNTER — Encounter: Payer: Self-pay | Admitting: Internal Medicine

## 2022-03-04 ENCOUNTER — Other Ambulatory Visit: Payer: Self-pay | Admitting: Internal Medicine

## 2022-03-04 MED ORDER — OXYCODONE-ACETAMINOPHEN 5-325 MG PO TABS: 1.0000 | ORAL_TABLET | Freq: Four times a day (QID) | ORAL | 0 refills | Status: DC | PRN

## 2022-03-11 ENCOUNTER — Encounter: Payer: Self-pay | Admitting: Internal Medicine

## 2022-03-11 DIAGNOSIS — J449 Chronic obstructive pulmonary disease, unspecified: Secondary | ICD-10-CM

## 2022-03-18 LAB — HEPATIC FUNCTION PANEL
ALT: 15 U/L (ref 7–35)
AST: 29 (ref 13–35)
Alkaline Phosphatase: 62 (ref 25–125)
Bilirubin, Total: 0.6

## 2022-03-18 LAB — CBC: RBC: 4.89 (ref 3.87–5.11)

## 2022-03-18 LAB — CBC AND DIFFERENTIAL
HCT: 43 (ref 36–46)
Hemoglobin: 14.1 (ref 12.0–16.0)
Platelets: 392 10*3/uL (ref 150–400)
WBC: 4.4

## 2022-03-18 LAB — BASIC METABOLIC PANEL
BUN: 8 (ref 4–21)
CO2: 29 — AB (ref 13–22)
Creatinine: 0.8 (ref 0.5–1.1)
Glucose: 90

## 2022-03-18 LAB — COMPREHENSIVE METABOLIC PANEL: Albumin: 4.3 (ref 3.5–5.0)

## 2022-03-19 ENCOUNTER — Telehealth: Payer: Self-pay

## 2022-03-19 NOTE — Chronic Care Management (AMB) (Signed)
? ? ?  Chronic Care Management ?Pharmacy Assistant  ? ?Name: FAREEHA EVON  MRN: [id number] DOB: [birth year] ? ?Reason for Encounter: Patient assistance ?  ? ?Medications: ?Outpatient Encounter Medications as of 03/19/2022  ?Medication Sig  ? Accu-Chek FastClix Lancets MISC Inject 1 each as directed daily. Use as directed to check blood sugars daily dx: e11.22 (Patient not taking: Reported on 01/21/2022)  ? albuterol (VENTOLIN HFA) 108 (90 Base) MCG/ACT inhaler Inhale 2 puffs into the lungs every 4 (four) hours as needed for wheezing or shortness of breath. (Patient taking differently: Inhale 2 puffs into the lungs every 4 (four) hours as needed for wheezing.)  ? aspirin 81 MG tablet Take 81 mg by mouth daily.  ? baclofen (LIORESAL) 10 MG tablet Take 1 tablet (10 mg total) by mouth 2 (two) times daily as needed for muscle spasms.  ? Biotin 10000 MCG TBDP Take 10,000 mcg by mouth daily.  ? bisoprolol (ZEBETA) 5 MG tablet 1/2 tab po qd  ? Budeson-Glycopyrrol-Formoterol (BREZTRI AEROSPHERE) 160-9-4.8 MCG/ACT AERO Inhale 2 puffs into the lungs 2 (two) times daily.  ? CALCIUM-MAGNESIUM-ZINC PO Take 1 capsule by mouth at bedtime.  ? Cholecalciferol (VITAMIN D3) 2000 UNITS TABS Take 2,000 Units by mouth at bedtime.  ? Coenzyme Q10 30 MG CHEW Chew 60 mg by mouth daily.  ? famotidine (PEPCID) 20 MG tablet One after supper  ? gabapentin (NEURONTIN) 300 MG capsule Take 1 capsule (300 mg total) by mouth 4 (four) times daily. (Patient taking differently: Take 300 mg by mouth 2 (two) times daily.)  ? glucose blood (ACCU-CHEK GUIDE) test strip 1 each by Other route daily. Use as instructed to check blood sugars 1 time per day dx: e11.22 (Patient not taking: Reported on 01/21/2022)  ? ipratropium-albuterol (DUONEB) 0.5-2.5 (3) MG/3ML SOLN USE 3 ML VIA NEBULIZER EVERY 6 HOURS AS NEEDED  ? lidocaine (LIDODERM) 5 % Place 1 patch onto the skin daily as needed (pain). Remove & Discard patch within 12 hours or as directed by MD  ? Magnesium  250 MG TABS   ? montelukast (SINGULAIR) 10 MG tablet TAKE 1 TABLET(10 MG) BY MOUTH AT BEDTIME  ? oxyCODONE-acetaminophen (PERCOCET) 5-325 MG tablet Take 1 tablet by mouth every 6 (six) hours as needed for severe pain.  ? pantoprazole (PROTONIX) 40 MG tablet TAKE 1 TABLET BY MOUTH 30 MINUTES BEFORE FIRST MEAL OF THE DAY  ? rosuvastatin (CRESTOR) 20 MG tablet TAKE 1 TABLET(20 MG) BY MOUTH AT BEDTIME  ? Semaglutide, 1 MG/DOSE, (OZEMPIC, 1 MG/DOSE,) 4 MG/3ML SOPN Inject 1 mg into the skin once a week. Friday  ? ?No facility-administered encounter medications on file as of 03/19/2022.  ? ?03-19-2022: Patient called stating that she is running low on Bretzri and contacted [facility] to follow up. Patient was informed that a prescription was needed and calling in a verbal will process the order faster. Was able to call in a verbal and was told to inform patient to call after lunch for delivery update. Patient informed and was very thankful for my hard work. ? ?Jeannette How CMA ?Clinical Pharmacist Assistant ?[phone] ? ?

## 2022-03-20 ENCOUNTER — Encounter: Payer: Self-pay | Admitting: Internal Medicine

## 2022-03-20 ENCOUNTER — Other Ambulatory Visit: Payer: Self-pay

## 2022-03-20 MED ORDER — BREZTRI AEROSPHERE 160-9-4.8 MCG/ACT IN AERO: 2.0000 | INHALATION_SPRAY | Freq: Two times a day (BID) | RESPIRATORY_TRACT | 6 refills | Status: DC

## 2022-03-29 ENCOUNTER — Encounter (HOSPITAL_COMMUNITY): Payer: Self-pay

## 2022-03-29 ENCOUNTER — Other Ambulatory Visit: Payer: Self-pay

## 2022-03-29 ENCOUNTER — Emergency Department (HOSPITAL_COMMUNITY): Payer: Medicare PPO

## 2022-03-29 ENCOUNTER — Emergency Department (HOSPITAL_COMMUNITY)
Admission: EM | Admit: 2022-03-29 | Discharge: 2022-03-29 | Disposition: A | Discharge status: Home / Self Care | Payer: Medicare PPO | Attending: Emergency Medicine | Admitting: Emergency Medicine

## 2022-03-29 DIAGNOSIS — Z7951 Long term (current) use of inhaled steroids: Secondary | ICD-10-CM | POA: Insufficient documentation

## 2022-03-29 DIAGNOSIS — R5383 Other fatigue: Secondary | ICD-10-CM | POA: Insufficient documentation

## 2022-03-29 DIAGNOSIS — E119 Type 2 diabetes mellitus without complications: Secondary | ICD-10-CM | POA: Insufficient documentation

## 2022-03-29 DIAGNOSIS — I10 Essential (primary) hypertension: Secondary | ICD-10-CM | POA: Insufficient documentation

## 2022-03-29 DIAGNOSIS — R079 Chest pain, unspecified: Secondary | ICD-10-CM

## 2022-03-29 DIAGNOSIS — Z7952 Long term (current) use of systemic steroids: Secondary | ICD-10-CM | POA: Diagnosis not present

## 2022-03-29 DIAGNOSIS — M546 Pain in thoracic spine: Secondary | ICD-10-CM | POA: Diagnosis present

## 2022-03-29 DIAGNOSIS — Z7982 Long term (current) use of aspirin: Secondary | ICD-10-CM | POA: Diagnosis not present

## 2022-03-29 DIAGNOSIS — G8929 Other chronic pain: Secondary | ICD-10-CM | POA: Insufficient documentation

## 2022-03-29 DIAGNOSIS — J441 Chronic obstructive pulmonary disease with (acute) exacerbation: Secondary | ICD-10-CM | POA: Diagnosis not present

## 2022-03-29 DIAGNOSIS — Z85118 Personal history of other malignant neoplasm of bronchus and lung: Secondary | ICD-10-CM | POA: Insufficient documentation

## 2022-03-29 LAB — CBC
HCT: 42.2 % (ref 36.0–46.0)
Hemoglobin: 13.8 g/dL (ref 12.0–15.0)
MCH: 30.1 pg (ref 26.0–34.0)
MCHC: 32.7 g/dL (ref 30.0–36.0)
MCV: 91.9 fL (ref 80.0–100.0)
Platelets: 352 10*3/uL (ref 150–400)
RBC: 4.59 MIL/uL (ref 3.87–5.11)
RDW: 14.8 % (ref 11.5–15.5)
WBC: 4.5 10*3/uL (ref 4.0–10.5)
nRBC: 0 % (ref 0.0–0.2)

## 2022-03-29 LAB — BASIC METABOLIC PANEL
Anion gap: 5 (ref 5–15)
BUN: 9 mg/dL (ref 8–23)
CO2: 29 mmol/L (ref 22–32)
Calcium: 9.4 mg/dL (ref 8.9–10.3)
Chloride: 104 mmol/L (ref 98–111)
Creatinine, Ser: 0.83 mg/dL (ref 0.44–1.00)
GFR, Estimated: >60 mL/min (ref >60)
Glucose, Bld: 85 mg/dL (ref 70–99)
Potassium: 4 mmol/L (ref 3.5–5.1)
Sodium: 138 mmol/L (ref 135–145)

## 2022-03-29 LAB — HEPATIC FUNCTION PANEL
ALT: 15 U/L (ref 0–44)
AST: 29 U/L (ref 15–41)
Albumin: 3.9 g/dL (ref 3.5–5.0)
Alkaline Phosphatase: 55 U/L (ref 38–126)
Bilirubin, Direct: 0.2 mg/dL (ref 0.0–0.2)
Indirect Bilirubin: 0.6 mg/dL (ref 0.3–0.9)
Total Bilirubin: 0.8 mg/dL (ref 0.3–1.2)
Total Protein: 7.6 g/dL (ref 6.5–8.1)

## 2022-03-29 LAB — TROPONIN I (HIGH SENSITIVITY)
Troponin I (High Sensitivity): 5 ng/L (ref <18)
Troponin I (High Sensitivity): 6 ng/L (ref <18)

## 2022-03-29 MED ORDER — PREDNISONE 20 MG PO TABS
60.0000 mg | ORAL_TABLET | Freq: Once | ORAL | Status: AC
  Administered 2022-03-29: 60 mg via ORAL
  Filled 2022-03-29: qty 3

## 2022-03-29 MED ORDER — ONDANSETRON HCL 4 MG/2ML IJ SOLN
4.0000 mg | Freq: Once | INTRAMUSCULAR | Status: AC
Start: 2022-03-29 — End: 2022-03-29
  Administered 2022-03-29: 4 mg via INTRAVENOUS
  Filled 2022-03-29: qty 2

## 2022-03-29 MED ORDER — HYDROMORPHONE HCL 1 MG/ML IJ SOLN
0.5000 mg | Freq: Once | INTRAMUSCULAR | Status: AC
  Administered 2022-03-29: 0.5 mg via INTRAMUSCULAR
  Filled 2022-03-29: qty 1

## 2022-03-29 MED ORDER — IPRATROPIUM-ALBUTEROL 0.5-2.5 (3) MG/3ML IN SOLN
3.0000 mL | Freq: Once | RESPIRATORY_TRACT | Status: AC
  Administered 2022-03-29: 3 mL via RESPIRATORY_TRACT
  Filled 2022-03-29: qty 3

## 2022-03-29 MED ORDER — IOHEXOL 350 MG/ML SOLN
75.0000 mL | Freq: Once | INTRAVENOUS | Status: AC | PRN
  Administered 2022-03-29: 75 mL via INTRAVENOUS

## 2022-03-29 MED ORDER — MORPHINE SULFATE (PF) 4 MG/ML IV SOLN
4.0000 mg | Freq: Once | INTRAVENOUS | Status: AC
  Administered 2022-03-29: 4 mg via INTRAVENOUS
  Filled 2022-03-29: qty 1

## 2022-03-29 MED ORDER — PREDNISONE 10 MG PO TABS
40.0000 mg | ORAL_TABLET | Freq: Every day | ORAL | 0 refills | Status: AC
Start: 2022-03-29 — End: 2022-04-02

## 2022-03-29 NOTE — ED Triage Notes (Signed)
Patient reports that she has right upper back pain that radiates into her chest since January. Patient states she has a history of lung cancer with radiation treatments. ? ?Patient states that she has had an MRI and ws seen by pain management. ?

## 2022-03-29 NOTE — ED Provider Triage Note (Signed)
Emergency Medicine Provider Triage Evaluation Note ? ?[name] , a 76 y.o. female  was evaluated in triage.  Pt complains of right sided upper back and chest pain ongoing since January. The patient had a MRI of the spine ordered by neurology which shows no nerve involvement. The patient recently had radiation for lung cancer in the upper right lobe. Denies shortness of breath. Dr.Kinard recommends and requests chest CT.  Patient also endorses rash under the right side of her bra. Describes burning, stinging, stabbing pain. Denies shortness of breath ? ?Review of Systems  ?Positive: Chest pain, rash ?Negative: Shortness of breath ? ?Physical Exam  ?BP 125/69 (BP Location: Left Arm)   Pulse 87   Temp 99 ?F (37.2 ?C) (Oral)   Resp 18   Ht 5\' 6"  (1.676 m)   Wt 88.9 kg   SpO2 95%   BMI 31.64 kg/m?  ?Gen:   Awake, no distress   ?Resp:  Normal effort  ?MSK:   Moves extremities without difficulty  ?Other:   ? ?Medical Decision Making  ?Medically screening exam initiated at 2:57 PM.  Appropriate orders placed.  [name] was informed that the remainder of the evaluation will be completed by another provider, this initial triage assessment does not replace that evaluation, and the importance of remaining in the ED until their evaluation is complete. ? ? ?  ?Dorothyann Peng, PA-C ?03/29/22 1459 ? ?

## 2022-03-29 NOTE — ED Provider Notes (Signed)
?[facility] DEPT ?Provider Note ? ? ?CSN: [id number] ?Arrival date & time: 03/29/22  1358 ? ?  ? ?History ? ?Chief Complaint  ?Patient presents with  ? Back Pain  ? Chest Pain  ? ? ?[name] is a 76 y.o. female. ? ?HPI ? ?  ? ? ? ?76 year old female with a history of diabetes, hypertension, COPD, lung adenocarcinoma status post radiation therapy presents with concern for right-sided chest pain and dyspnea. ? ?Has had right upper back pain, fatigue, shortness of breath and cough for which she had discussed with her primary care physician and radiation oncologist in January.  Her cough and dyspnea had been present with her lung cancer diagnosis and had worsened post radiation.  The pain has been present over the last 2 months, and is severe.  Describes it as a sharp, stabbing pain that is constant over the last 2 months. She has been seeing pain physicians, neurology, with the thought that it was nerve pain. Has tried creams, gabapentin, oxycodone, tylenol without relief.  She recently had an MRI that was completed that did not show signs of spinal pathology as they had initially thought per patient, and he husband decided it was time to see what was going on per pt. She did talk to team who felt evaluation with CT contrast appropriate.  ? ?Over the last week, she is also felt that she had increased wheezing. ? ?Denies abdominal pain, lightheadedness, nausea, vomiting, diarrhea, black or bloody stools. ? ?Past Medical History:  ?Diagnosis Date  ? Arthritis   ? Asthma   ? Complication of anesthesia   ? COPD (chronic obstructive pulmonary disease) ([place])   ? Diabetes ([place])   ? High cholesterol   ? History of radiation therapy   ? Right Lung- 12/04/21-12/11/21- Dr. Gery Pray  ? Hypertension   ? Lung cancer ([place])   ? Pneumonia 09/10/2017  ? PONV (postoperative nausea and vomiting)   ? Stroke [facility]) 2007  ? mini strokes  ?  ? ?Home Medications ?Prior to Admission medications    ?Medication Sig Start Date End Date Taking? Authorizing Provider  ?predniSONE (DELTASONE) 10 MG tablet Take 4 tablets (40 mg total) by mouth daily for 4 days. 03/29/22 04/02/22 Yes Gareth Morgan, MD  ?Accu-Chek FastClix Lancets MISC Inject 1 each as directed daily. Use as directed to check blood sugars daily dx: e11.22 ?Patient not taking: Reported on 01/21/2022 09/10/19   [name], MD  ?albuterol (VENTOLIN HFA) 108 (90 Base) MCG/ACT inhaler Inhale 2 puffs into the lungs every 4 (four) hours as needed for wheezing or shortness of breath. ?Patient taking differently: Inhale 2 puffs into the lungs every 4 (four) hours as needed for wheezing. 04/04/21   [name], MD  ?aspirin 81 MG tablet Take 81 mg by mouth daily.    [provider]  ?baclofen (LIORESAL) 10 MG tablet Take 1 tablet (10 mg total) by mouth 2 (two) times daily as needed for muscle spasms. 01/24/22 01/24/23  [name], MD  ?Biotin 10000 MCG TBDP Take 10,000 mcg by mouth daily.    [provider]  ?bisoprolol (ZEBETA) 5 MG tablet 1/2 tab po qd 12/05/21   [name], MD  ?Budeson-Glycopyrrol-Formoterol (BREZTRI AEROSPHERE) 160-9-4.8 MCG/ACT AERO Inhale 2 puffs into the lungs 2 (two) times daily. 03/20/22   [name], MD  ?CALCIUM-MAGNESIUM-ZINC PO Take 1 capsule by mouth at bedtime.    [provider]  ?Cholecalciferol (VITAMIN D3) 2000 UNITS TABS Take 2,000  Units by mouth at bedtime.    [provider]  ?Coenzyme Q10 30 MG CHEW Chew 60 mg by mouth daily.    [provider]  ?famotidine (PEPCID) 20 MG tablet One after supper 01/24/22   [name], MD  ?gabapentin (NEURONTIN) 300 MG capsule Take 1 capsule (300 mg total) by mouth 4 (four) times daily. ?Patient taking differently: Take 300 mg by mouth 2 (two) times daily. 07/23/21   Tanda Rockers, MD  ?glucose blood (ACCU-CHEK GUIDE) test strip 1 each by Other route daily. Use as instructed to check blood sugars 1 time per day dx:  e11.22 ?Patient not taking: Reported on 01/21/2022 09/10/19   [name], MD  ?ipratropium-albuterol (DUONEB) 0.5-2.5 (3) MG/3ML SOLN USE 3 ML VIA NEBULIZER EVERY 6 HOURS AS NEEDED 08/23/21   Minette Brine, FNP  ?lidocaine (LIDODERM) 5 % Place 1 patch onto the skin daily as needed (pain). Remove & Discard patch within 12 hours or as directed by MD    [provider]  ?Magnesium 250 MG TABS     [provider]  ?montelukast (SINGULAIR) 10 MG tablet TAKE 1 TABLET(10 MG) BY MOUTH AT BEDTIME 08/03/21   Tanda Rockers, MD  ?oxyCODONE-acetaminophen (PERCOCET) 5-325 MG tablet Take 1 tablet by mouth every 6 (six) hours as needed for severe pain. 03/04/22   [name], MD  ?pantoprazole (PROTONIX) 40 MG tablet TAKE 1 TABLET BY MOUTH 30 MINUTES BEFORE FIRST MEAL OF THE DAY 01/01/22   [name], MD  ?rosuvastatin (CRESTOR) 20 MG tablet TAKE 1 TABLET(20 MG) BY MOUTH AT BEDTIME 10/24/21   [name], MD  ?Semaglutide, 1 MG/DOSE, (OZEMPIC, 1 MG/DOSE,) 4 MG/3ML SOPN Inject 1 mg into the skin once a week. Friday 02/28/22   Minette Brine, FNP  ?   ? ?Allergies    ?Other   ? ?Review of Systems   ?Review of Systems ?See above ?Physical Exam ?Updated Vital Signs ?BP 135/69   Pulse 96   Temp 99 ?F (37.2 ?C) (Oral)   Resp 18   Ht 5\' 6"  (1.676 m)   Wt 88.9 kg   SpO2 93%   BMI 31.64 kg/m?  ?Physical Exam ?Vitals and nursing note reviewed.  ?Constitutional:   ?   General: She is not in acute distress. ?   Appearance: She is well-developed. She is not diaphoretic.  ?HENT:  ?   Head: Normocephalic and atraumatic.  ?Eyes:  ?   Conjunctiva/sclera: Conjunctivae normal.  ?Cardiovascular:  ?   Rate and Rhythm: Normal rate and regular rhythm.  ?   Heart sounds: Normal heart sounds. No murmur heard. ?  No friction rub. No gallop.  ?Pulmonary:  ?   Effort: Pulmonary effort is normal. No respiratory distress.  ?   Breath sounds: Normal breath sounds. No wheezing or rales.  ?Abdominal:  ?   General: There is no  distension.  ?   Palpations: Abdomen is soft.  ?   Tenderness: There is no abdominal tenderness. There is no guarding.  ?Musculoskeletal:     ?   General: No tenderness.  ?   Cervical back: Normal range of motion.  ?Skin: ?   General: Skin is warm and dry.  ?   Findings: No erythema or rash.  ?Neurological:  ?   Mental Status: She is alert and oriented to person, place, and time.  ? ? ?ED Results / Procedures / Treatments   ?Labs ?(all labs ordered are listed, but only abnormal results  are displayed) ?Labs Reviewed  ?BASIC METABOLIC PANEL  ?CBC  ?HEPATIC FUNCTION PANEL  ?TROPONIN I (HIGH SENSITIVITY)  ?TROPONIN I (HIGH SENSITIVITY)  ? ? ?EKG ?EKG Interpretation ? ?Date/Time:  Friday March 29 2022 14:39:11 EDT ?Ventricular Rate:  83 ?PR Interval:  173 ?QRS Duration: 92 ?QT Interval:  406 ?QTC Calculation: 478 ?R Axis:   77 ?Text Interpretation: Sinus rhythm Consider right atrial enlargement Baseline wander in lead(s) V1 No significant change since last tracing baseline wander V1 Confirmed by Gareth Morgan [phone]) on 03/29/2022 5:22:01 PM ? ?Radiology ?DG Chest 1 View ? ?Result Date: 03/29/2022 ?CLINICAL DATA:  Shortness of breath and back pain EXAM: CHEST  1 VIEW COMPARISON:  Chest x-ray dated October 23, 2021; CT chest dated October 19, 2021 FINDINGS: Cardiac and mediastinal contours are within normal limits. Opacity of the left costophrenic angle, compatible with pleural thickening when compared with prior CT. Biopsy marking clip noted adjacent to the upper right hilum located adjacent to a nodular opacity, unchanged when compared to prior exam. No acute opacities. No large pleural effusion or pneumothorax. IMPRESSION: No acute cardiopulmonary abnormality. Electronically Signed   By: Yetta Glassman M.D.   On: 03/29/2022 16:05  ? ?CT Angio Chest PE W and/or Wo Contrast ? ?Result Date: 03/29/2022 ?CLINICAL DATA:  Right upper back pain, chest pain EXAM: CT ANGIOGRAPHY CHEST WITH CONTRAST TECHNIQUE: Multidetector CT  imaging of the chest was performed using the standard protocol during bolus administration of intravenous contrast. Multiplanar CT image reconstructions and MIPs were obtained to evaluate the vascular anatomy. RADIATION DOSE REDUCTION: Thi

## 2022-03-29 NOTE — ED Notes (Signed)
Discharge instructions reviewed, questions answered. Rx education provided. Pt states understanding and no further questions. Pt ambulatory with steady gait upon discharge. No s/s of distress noted. ? ?

## 2022-04-09 ENCOUNTER — Telehealth: Payer: Self-pay

## 2022-04-09 ENCOUNTER — Ambulatory Visit: Payer: Medicare PPO | Admitting: Internal Medicine

## 2022-04-09 ENCOUNTER — Encounter: Payer: Self-pay | Admitting: Internal Medicine

## 2022-04-09 VITALS — BP 124/82 | HR 96 | Temp 99.1°F | Ht 64.8 in | Wt 185.8 lb

## 2022-04-09 DIAGNOSIS — Z6831 Body mass index (BMI) 31.0-31.9, adult: Secondary | ICD-10-CM

## 2022-04-09 DIAGNOSIS — R151 Fecal smearing: Secondary | ICD-10-CM

## 2022-04-09 DIAGNOSIS — G894 Chronic pain syndrome: Secondary | ICD-10-CM

## 2022-04-09 DIAGNOSIS — I119 Hypertensive heart disease without heart failure: Secondary | ICD-10-CM

## 2022-04-09 DIAGNOSIS — E1169 Type 2 diabetes mellitus with other specified complication: Secondary | ICD-10-CM

## 2022-04-09 DIAGNOSIS — E6609 Other obesity due to excess calories: Secondary | ICD-10-CM

## 2022-04-09 DIAGNOSIS — E785 Hyperlipidemia, unspecified: Secondary | ICD-10-CM

## 2022-04-09 MED ORDER — DULOXETINE HCL 30 MG PO CPEP: 30.0000 mg | ORAL_CAPSULE | Freq: Every day | ORAL | 2 refills | Status: DC

## 2022-04-09 NOTE — Chronic Care Management (AMB) (Addendum)
? ? ?  Chronic Care Management ?Pharmacy Assistant  ? ?Name: [name]  MRN: [id number] DOB: 07-15-1946 ? ? ?Reason for Encounter: Patient assistance ?  ?Medications: ?Outpatient Encounter Medications as of 04/09/2022  ?Medication Sig  ? Accu-Chek FastClix Lancets MISC Inject 1 each as directed daily. Use as directed to check blood sugars daily dx: e11.22 (Patient not taking: Reported on 01/21/2022)  ? albuterol (VENTOLIN HFA) 108 (90 Base) MCG/ACT inhaler Inhale 2 puffs into the lungs every 4 (four) hours as needed for wheezing or shortness of breath.  ? aspirin 81 MG tablet Take 81 mg by mouth daily.  ? Biotin 10000 MCG TBDP Take 10,000 mcg by mouth daily.  ? bisoprolol (ZEBETA) 5 MG tablet 1/2 tab po qd  ? Budeson-Glycopyrrol-Formoterol (BREZTRI AEROSPHERE) 160-9-4.8 MCG/ACT AERO Inhale 2 puffs into the lungs 2 (two) times daily.  ? CALCIUM-MAGNESIUM-ZINC PO Take 1 capsule by mouth at bedtime.  ? Cholecalciferol (VITAMIN D3) 2000 UNITS TABS Take 2,000 Units by mouth at bedtime.  ? Coenzyme Q10 30 MG CHEW Chew 60 mg by mouth daily. (Patient not taking: Reported on 04/09/2022)  ? DULoxetine (CYMBALTA) 30 MG capsule Take 1 capsule (30 mg total) by mouth daily.  ? famotidine (PEPCID) 20 MG tablet One after supper  ? gabapentin (NEURONTIN) 300 MG capsule Take 1 capsule (300 mg total) by mouth 4 (four) times daily. (Patient taking differently: Take 300 mg by mouth 2 (two) times daily.)  ? glucose blood (ACCU-CHEK GUIDE) test strip 1 each by Other route daily. Use as instructed to check blood sugars 1 time per day dx: e11.22  ? ipratropium-albuterol (DUONEB) 0.5-2.5 (3) MG/3ML SOLN USE 3 ML VIA NEBULIZER EVERY 6 HOURS AS NEEDED  ? lidocaine (LIDODERM) 5 % Place 1 patch onto the skin daily as needed (pain). Remove & Discard patch within 12 hours or as directed by MD  ? montelukast (SINGULAIR) 10 MG tablet TAKE 1 TABLET(10 MG) BY MOUTH AT BEDTIME  ? oxyCODONE-acetaminophen (PERCOCET) 5-325 MG tablet Take 1 tablet by  mouth every 6 (six) hours as needed for severe pain.  ? pantoprazole (PROTONIX) 40 MG tablet TAKE 1 TABLET BY MOUTH 30 MINUTES BEFORE FIRST MEAL OF THE DAY  ? rosuvastatin (CRESTOR) 20 MG tablet TAKE 1 TABLET(20 MG) BY MOUTH AT BEDTIME  ? Semaglutide, 1 MG/DOSE, (OZEMPIC, 1 MG/DOSE,) 4 MG/3ML SOPN Inject 1 mg into the skin once a week. Friday  ? ?No facility-administered encounter medications on file as of 04/09/2022.  ? ?04-05-2022: Patient called and stated her shipment of Ozempic shipment is still being processed. Requested a sample from [name] and was told it was available and can be picked up Monday. Informed patient and she stated she will get sample at her appointment with Dr. Baird Cancer on Tuesday. ? ?04-09-2022: Patient stated only Ozempic 2 mg samples were available. Contacted [facility] and was informed patient's delivery was shipped and returned on 03-22-2022 due to office being closed. Order reshipment was put in. Another voucher was issued. ?BIN [id number] ?PCN [id number] ?GP [id number] ?ID [id number] ? ?Contacted [facility] with voucher information. Medication is ready for pickup. Sent patient a message with update. ? ?[name] CMA ?Clinical Pharmacist Assistant ?[phone] ? ?

## 2022-04-09 NOTE — Progress Notes (Signed)
?[name],acting as a Education administrator for Tammy Greenland, [name].,have documented all relevant documentation on the behalf of Tammy Greenland, [name],as directed by  Tammy Greenland, [name] while in the presence of Tammy Greenland, [name].  ?This visit occurred during the SARS-CoV-2 public health emergency.  Safety protocols were in place, including screening questions prior to the visit, additional usage of staff PPE, and extensive cleaning of exam room while observing appropriate contact time as indicated for disinfecting solutions. ? ?Subjective:  ?  ? Patient ID: [name] , female    DOB: 1946/03/01 , 76 y.o.   MRN: [id number] ? ? ?Chief Complaint  ?Patient presents with  ? Diabetes  ? Hypertension  ? ? ?HPI ? ?Pt is here today for diabetes and bp f/u. Patient reports compliance with her medications. Patient reports is having some issues with bowel leakage. She is unsure whether it is due to  having radiation therapy. She is also still in a lot of pain. She is seeing pain specialist at [facility].  ? ?Diabetes ?She presents for her follow-up diabetic visit. She has type 2 diabetes mellitus. Her disease course has been stable. There are no hypoglycemic associated symptoms. Pertinent negatives for hypoglycemia include no dizziness or headaches. Pertinent negatives for diabetes include no blurred vision, no chest pain, no fatigue, no polydipsia, no polyphagia and no polyuria. There are no hypoglycemic complications. Risk factors for coronary artery disease include diabetes mellitus, dyslipidemia, hypertension, obesity, sedentary lifestyle and post-menopausal. She has not had a previous visit with a dietitian. She participates in exercise three times a week. Her breakfast blood glucose is taken between 8-9 am. Her breakfast blood glucose range is generally 110-130 mg/dl. Eye exam is current.  ?Hypertension ?This is a chronic problem. The current episode started more than 1 year ago. The problem has been gradually improving  since onset. The problem is controlled. Associated symptoms include shortness of breath. Pertinent negatives include no blurred vision, chest pain, headaches or palpitations. The current treatment provides moderate improvement. Compliance problems include exercise.    ? ?Past Medical History:  ?Diagnosis Date  ? Arthritis   ? Asthma   ? Complication of anesthesia   ? COPD (chronic obstructive pulmonary disease) ([place])   ? Diabetes ([place])   ? High cholesterol   ? History of radiation therapy   ? Right Lung- 12/04/21-12/11/21- Dr. Gery Pray  ? Hypertension   ? Lung cancer ([place])   ? Pneumonia 09/10/2017  ? PONV (postoperative nausea and vomiting)   ? Stroke [facility]) 2007  ? mini strokes  ?  ? ?Family History  ?Problem Relation Age of Onset  ? Heart murmur Mother   ? Heart attack Father   ? Diabetes Father   ? Diabetes Brother   ? ? ? ?Current Outpatient Medications:  ?  albuterol (VENTOLIN HFA) 108 (90 Base) MCG/ACT inhaler, Inhale 2 puffs into the lungs every 4 (four) hours as needed for wheezing or shortness of breath., Disp: 18 g, Rfl: 12 ?  aspirin 81 MG tablet, Take 81 mg by mouth daily., Disp: , Rfl:  ?  Biotin 10000 MCG TBDP, Take 10,000 mcg by mouth daily., Disp: , Rfl:  ?  bisoprolol (ZEBETA) 5 MG tablet, 1/2 tab po qd, Disp: 45 tablet, Rfl: 3 ?  Budeson-Glycopyrrol-Formoterol (BREZTRI AEROSPHERE) 160-9-4.8 MCG/ACT AERO, Inhale 2 puffs into the lungs 2 (two) times daily., Disp: 10.7 g, Rfl: 6 ?  CALCIUM-MAGNESIUM-ZINC PO, Take 1 capsule by mouth at bedtime., Disp: ,  Rfl:  ?  Cholecalciferol (VITAMIN D3) 2000 UNITS TABS, Take 2,000 Units by mouth at bedtime., Disp: , Rfl:  ?  DULoxetine (CYMBALTA) 30 MG capsule, Take 1 capsule (30 mg total) by mouth daily., Disp: 30 capsule, Rfl: 2 ?  famotidine (PEPCID) 20 MG tablet, One after supper, Disp: 90 tablet, Rfl: 1 ?  gabapentin (NEURONTIN) 300 MG capsule, Take 1 capsule (300 mg total) by mouth 4 (four) times daily. (Patient taking differently: Take 300 mg by mouth 2  (two) times daily.), Disp: 120 capsule, Rfl: 5 ?  glucose blood (ACCU-CHEK GUIDE) test strip, 1 each by Other route daily. Use as instructed to check blood sugars 1 time per day dx: e11.22, Disp: 50 each, Rfl: 11 ?  ipratropium-albuterol (DUONEB) 0.5-2.5 (3) MG/3ML SOLN, USE 3 ML VIA NEBULIZER EVERY 6 HOURS AS NEEDED, Disp: 180 mL, Rfl: 1 ?  lidocaine (LIDODERM) 5 %, Place 1 patch onto the skin daily as needed (pain). Remove & Discard patch within 12 hours or as directed by [name], Disp: , Rfl:  ?  montelukast (SINGULAIR) 10 MG tablet, TAKE 1 TABLET(10 MG) BY MOUTH AT BEDTIME, Disp: 90 tablet, Rfl: 2 ?  oxyCODONE-acetaminophen (PERCOCET) 5-325 MG tablet, Take 1 tablet by mouth every 6 (six) hours as needed for severe pain., Disp: 30 tablet, Rfl: 0 ?  pantoprazole (PROTONIX) 40 MG tablet, TAKE 1 TABLET BY MOUTH 30 MINUTES BEFORE FIRST MEAL OF THE DAY, Disp: 90 tablet, Rfl: 2 ?  rosuvastatin (CRESTOR) 20 MG tablet, TAKE 1 TABLET(20 MG) BY MOUTH AT BEDTIME, Disp: 90 tablet, Rfl: 1 ?  Semaglutide, 1 MG/DOSE, (OZEMPIC, 1 MG/DOSE,) 4 MG/3ML SOPN, Inject 1 mg into the skin once a week. Friday, Disp: 3 mL, Rfl: 1 ?  Accu-Chek FastClix Lancets MISC, Inject 1 each as directed daily. Use as directed to check blood sugars daily dx: e11.22 (Patient not taking: Reported on 01/21/2022), Disp: 50 each, Rfl: 11 ?  Coenzyme Q10 30 MG CHEW, Chew 60 mg by mouth daily. (Patient not taking: Reported on 04/09/2022), Disp: , Rfl:   ? ?Allergies  ?Allergen Reactions  ? Other Swelling  ?  HAIR DYE  ?  ? ?Review of Systems  ?Constitutional:  Negative for fatigue.  ?Eyes:  Negative for blurred vision.  ?Respiratory:  Positive for shortness of breath.   ?Cardiovascular:  Negative for chest pain and palpitations.  ?Endocrine: Negative for polydipsia, polyphagia and polyuria.  ?Neurological:  Negative for dizziness and headaches.   ? ?Today's Vitals  ? 04/09/22 1143  ?BP: 124/82  ?Pulse: 96  ?Temp: 99.1 ?F (37.3 ?C)  ?Weight: 185 lb 12.8 oz (84.3 kg)   ?Height: 5' 4.8" (1.646 m)  ?PainSc: 3   ? ?Body mass index is 31.11 kg/m?.  ?Wt Readings from Last 3 Encounters:  ?04/09/22 185 lb 12.8 oz (84.3 kg)  ?03/29/22 196 lb (88.9 kg)  ?01/21/22 200 lb 3.2 oz (90.8 kg)  ?  ? ?Objective:  ?Physical Exam ?Vitals and nursing note reviewed.  ?Constitutional:   ?   Appearance: Normal appearance.  ?HENT:  ?   Head: Normocephalic and atraumatic.  ?Eyes:  ?   Extraocular Movements: Extraocular movements intact.  ?Cardiovascular:  ?   Rate and Rhythm: Normal rate and regular rhythm.  ?   Heart sounds: Normal heart sounds.  ?Pulmonary:  ?   Effort: Pulmonary effort is normal.  ?   Breath sounds: Normal breath sounds.  ?Musculoskeletal:  ?   Cervical back: Normal range of motion.  ?  Skin: ?   General: Skin is warm.  ?Neurological:  ?   General: No focal deficit present.  ?   Mental Status: She is alert.  ?Psychiatric:     ?   Mood and Affect: Mood normal.     ?   Behavior: Behavior normal.  ?  ? ?   ?Assessment And Plan:  ?   ?1. Hypertensive heart disease without heart failure ?Comments: Chronic, well controlled. No need for med changes. She is encouraged to follow a low sodium diet.  ? ?2. Dyslipidemia associated with type 2 diabetes mellitus ([place]) ?Comments: Chronic, I will check labs as below. She is encouraged to follow dietary guidelines. Does not wish to do labs today, states [name] did a full panel. I will request lab results from [facility].  ?- BMP8+EGFR; Future ?- Hemoglobin A1c; Future ? ?3. Chronic pain syndrome ?Comments: Sx appeared to have started after XRT for recently diagnosed lung CA. She is now followed by [facility]. We discussed use of duloxetine for chronic pai ?- DULoxetine (CYMBALTA) 30 MG capsule; Take 1 capsule (30 mg total) by mouth daily.  Dispense: 30 capsule; Refill: 2 ? ?4. Fecal smearing ?Comments: Persstent, she previously requested referral to Dr. Earlean Shawl who is now retiring. Referral coordinator will set up appt with others in his practice.   ? ?5. Class 1 obesity due to excess calories with serious comorbidity and body mass index (BMI) of 31.0 to 31.9 in adult ?Comments: She is encouraged to aim for at least 150 minutes of exercise/week,

## 2022-04-09 NOTE — Patient Instructions (Addendum)
Pelvic Floor Dysfunction, Female ?Pelvic floor dysfunction (PFD) is a condition that results when the group of muscles and connective tissues that support the organs in the pelvis (pelvic floor muscles) do not work well. These muscles and their connections form a sling that supports the colon and bladder. In women, they also support the uterus. ?PFD causes pelvic floor muscles to be too weak, too tight, or both. In PFD, muscle movements are not coordinated. This may cause bowel or bladder problems. It may also cause pain. ?What are the causes? ?This condition may be caused by an injury to the pelvic area or by a weakening of pelvic muscles. This often results from pregnancy and childbirth or other types of strain. In many cases, the exact cause is not known. ?What increases the risk? ?The following factors may make you more likely to develop this condition: ?Having chronic bladder tissue inflammation (interstitial cystitis). ?Being an older person. ?Being overweight. ?History of radiation treatment for cancer in the pelvic region. ?Previous pelvic surgery, such as removal of the uterus (hysterectomy). ?What are the signs or symptoms? ?Symptoms of this condition vary and may include: ?Bladder symptoms, such as: ?Trouble starting urination and emptying the bladder. ?Frequent urinary tract infections. ?Leaking urine when coughing, laughing, or exercising (stress incontinence). ?Having to pass urine urgently or frequently. ?Pain when passing urine. ?Bowel symptoms, such as: ?Constipation. ?Urgent or frequent bowel movements. ?Incomplete bowel movements. ?Painful bowel movements. ?Leaking stool or gas. ?Unexplained genital or rectal pain. ?Genital or rectal muscle spasms. ?Low back pain. ?Other symptoms may include: ?A heavy, full, or aching feeling in the vagina. ?A bulge that protrudes into the vagina. ?Pain during or after sex. ?How is this diagnosed? ?This condition may be diagnosed based on: ?Your symptoms and  medical history. ?A physical exam. During the exam, your health care provider may check your pelvic muscles for tightness, spasm, pain, or weakness. This may include a rectal exam and a pelvic exam. ?In some cases, you may have diagnostic tests, such as: ?Electrical muscle function tests. ?Urine flow testing. ?X-ray tests of bowel function. ?Ultrasound of the pelvic organs. ?How is this treated? ?Treatment for this condition depends on the symptoms. Treatment options include: ?Physical therapy. This may include Kegel exercises to help relax or strengthen the pelvic floor muscles. ?Biofeedback. This type of therapy provides feedback on how tight your pelvic floor muscles are so that you can learn to control them. ?Internal or external massage therapy. ?A treatment that involves electrical stimulation of the pelvic floor muscles to help control pain (transcutaneous electrical nerve stimulation, or TENS). ?Sound wave therapy (ultrasound) to reduce muscle spasms. ?Medicines, such as: ?Muscle relaxants. ?Bladder control medicines. ?Surgery to reconstruct or support pelvic floor muscles may be an option if other treatments do not help. ?Follow these instructions at home: ?Activity ?Do your usual activities as told by your health care provider. Ask your health care provider if you should modify any activities. ?Do pelvic floor strengthening or relaxing exercises at home as told by your physical therapist. ?Lifestyle ?Maintain a healthy weight. ?Eat foods that are high in fiber, such as beans, whole grains, and fresh fruits and vegetables. ?Limit foods that are high in fat and processed sugars, such as fried or sweet foods. ?Manage stress with relaxation techniques such as yoga or meditation. ?General instructions ?If you have problems with leakage: ?Use absorbable pads or wear padded underwear. ?Wash frequently with mild soap. ?Keep your genital and anal area as clean and dry as possible. ?  Ask your health care provider if  you should try a barrier cream to prevent skin irritation. ?Take warm baths to relieve pelvic muscle tension or spasms. ?Take over-the-counter and prescription medicines only as told by your health care provider. ?Keep all follow-up visits. ?How is this prevented? ?The cause of PFD is not always known, but there are a few things you can do to reduce the risk of developing this condition, including: ?Staying at a healthy weight. ?Getting regular exercise. ?Managing stress. ?Contact a health care provider if: ?Your symptoms are not improving with home care. ?You have signs or symptoms of PFD that get worse at home. ?You develop new signs or symptoms. ?You have signs of a urinary tract infection, such as: ?Fever. ?Chills. ?Increased urinary frequency. ?A burning feeling when urinating. ?You have not had a bowel movement in 3 days (constipation). ?Summary ?Pelvic floor dysfunction results when the muscles and connective tissues in your pelvic floor do not work well. ?These muscles and their connections form a sling that supports your colon and bladder. In women, they also support the uterus. ?PFD may be caused by an injury to the pelvic area or by a weakening of pelvic muscles. ?PFD causes pelvic floor muscles to be too weak, too tight, or a combination of both. Symptoms may vary from person to person. ?In most cases, PFD can be treated with physical therapies and medicines. Surgery may be an option if other treatments do not help. ?This information is not intended to replace advice given to you by your health care provider. Make sure you discuss any questions you have with your health care provider. ?Document Revised: 04/25/2021 Document Reviewed: 04/25/2021 ?Elsevier Patient Education ? 2022 [place]. ? ? ? ? ?Diabetes Mellitus and Nutrition, Adult ?When you have diabetes, or diabetes mellitus, it is very important to have healthy eating habits because your blood sugar (glucose) levels are greatly affected by what  you eat and drink. Eating healthy foods in the right amounts, at about the same times every day, can help you: ?Manage your blood glucose. ?Lower your risk of heart disease. ?Improve your blood pressure. ?Reach or maintain a healthy weight. ?What can affect my meal plan? ?Every person with diabetes is different, and each person has different needs for a meal plan. Your health care provider may recommend that you work with a dietitian to make a meal plan that is best for you. Your meal plan may vary depending on factors such as: ?The calories you need. ?The medicines you take. ?Your weight. ?Your blood glucose, blood pressure, and cholesterol levels. ?Your activity level. ?Other health conditions you have, such as heart or kidney disease. ?How do carbohydrates affect me? ?Carbohydrates, also called carbs, affect your blood glucose level more than any other type of food. Eating carbs raises the amount of glucose in your blood. ?It is important to know how many carbs you can safely have in each meal. This is different for every person. Your dietitian can help you calculate how many carbs you should have at each meal and for each snack. ?How does alcohol affect me? ?Alcohol can cause a decrease in blood glucose (hypoglycemia), especially if you use insulin or take certain diabetes medicines by mouth. Hypoglycemia can be a life-threatening condition. Symptoms of hypoglycemia, such as sleepiness, dizziness, and confusion, are similar to symptoms of having too much alcohol. ?Do not drink alcohol if: ?Your health care provider tells you not to drink. ?You are pregnant, may be pregnant, or are  planning to become pregnant. ?If you drink alcohol: ?Limit how much you have to: ?0-1 drink a day for women. ?0-2 drinks a day for men. ?Know how much alcohol is in your drink. In the [place] one drink equals one 12 oz bottle of beer (355 mL), one 5 oz glass of wine (148 mL), or one 1? oz glass of hard liquor (44 mL). ?Keep yourself  hydrated with water, diet soda, or unsweetened iced tea. Keep in mind that regular soda, juice, and other mixers may contain a lot of sugar and must be counted as carbs. ?What are tips for following this plan

## 2022-04-12 ENCOUNTER — Telehealth: Payer: Self-pay | Admitting: *Deleted

## 2022-04-12 NOTE — Telephone Encounter (Signed)
CALLED PATIENT TO ALTER FU APPT. ON 04-25-22 DUE TO DR. KINARD BEING ON VACATION, SPOKE WITH PATIENT AND SHE AGREED TO COME ON 05-16-22 @ 8:45 AM ?

## 2022-04-15 ENCOUNTER — Telehealth: Payer: Self-pay | Admitting: *Deleted

## 2022-04-15 ENCOUNTER — Encounter: Payer: Self-pay | Admitting: Internal Medicine

## 2022-04-15 NOTE — Telephone Encounter (Signed)
CALLED PATIENT TO INFORM THAT DR. KINARD WILL BE WILLING TO SEE HER ON HIS PUT DAY, PATIENT AGREED TO COME ON 04-30-22 @ 11 AM ?

## 2022-04-20 ENCOUNTER — Other Ambulatory Visit: Payer: Self-pay | Admitting: Internal Medicine

## 2022-04-21 ENCOUNTER — Other Ambulatory Visit: Payer: Self-pay | Admitting: Internal Medicine

## 2022-04-23 ENCOUNTER — Other Ambulatory Visit: Payer: Self-pay

## 2022-04-23 MED ORDER — OZEMPIC (1 MG/DOSE) 4 MG/3ML ~~LOC~~ SOPN: 1.0000 mg | PEN_INJECTOR | SUBCUTANEOUS | 3 refills | Status: AC

## 2022-04-25 ENCOUNTER — Ambulatory Visit: Payer: Self-pay | Admitting: Radiation Oncology

## 2022-04-29 NOTE — Progress Notes (Signed)
?Radiation Oncology         [phone] ?________________________________ ? ?Name: [name] MRN: [id number]  ?Date: 04/30/2022  DOB: 03-Jun-1946 ? ?Follow-Up Visit Note ? ?CC: [name], MD  Garner Nash, DO ? ?  ICD-10-CM   ?1. Solitary pulmonary nodule on lung CT  R91.1 CT CHEST WO CONTRAST  ?  ? ? ?Diagnosis:  Adenocarcinoma - RUL pulmonary nodule, clinical stage I ? ?Interval Since Last Radiation: 4 months and 19 days  ? ?Intent: Curative ? ?Radiation Treatment Dates: 12/04/2021 through 12/11/2021 ?Site Technique Total Dose (Gy) Dose per Fx (Gy) Completed Fx Beam Energies  ?Lung, Right: Lung_Rt IMRT 54/54 18 3/3 6XFFF  ? ? ?Narrative:  The patient returns today for routine follow-up and to review recent imaging, she was last seen here for follow-up on 01/21/22. Since her last visit, the patient presented to the ED on 03/29/22 with right sided chest pain and dyspnea. To review, the patient has had ongoing dyspnea and cough with her lung cancer diagnosis which progressed following RT. CXR, EKG, and labs including troponin were all negative. Given her sharp chest pain, a chest CTA was performed which showed no evidence of PE, and a decrease in size of spiculated RUL nodule (measuring 11 x 8 mm, previously 16 x 14 mm) and a stable 5 mm nodule along the minor fissure. Given negative work-up, the patient was discharged home with instructions to follow up with pain management and OP providers. Of note: the patient also endorsed increased wheezing x 1 week and was given DuoNebs and prednisone for COPD exacerbation. ? ?The patient also had an MRI of the thoracic spine performed recently on 03/27/22 (for evaluation of back pain) which showed mild spondylosis without any severe spinal cord or nerve root encroachment.   ? ?She reports having pain along the right lateral rib cage for several months now.  She has seen a neurologist without a good explanation.  She is seeing a pain management doctor for  medications concerning this issue.  She she was placed on Neurontin and reports that this medication did not help with her symptoms. ? ?I discussed with the patient that her area of treatment in the right chest was very medial and far from the rib cage area so this pain would not be explained by her SBRT treatments.  She does now report some occasional shocking sensations throughout her body and twitching have recommended she follow-up again with her neurologist with these new symptoms. ? ?She denies any changes in her breathing since SBRT.  She continues to have frequent use of her nebulizer and other pulmonary meds she denies any significant cough or hemoptysis. ? ? ? ?Allergies:  is allergic to other. ? ?Meds: ?Current Outpatient Medications  ?Medication Sig Dispense Refill  ? albuterol (VENTOLIN HFA) 108 (90 Base) MCG/ACT inhaler INHALE 2 PUFFS INTO THE LUNGS EVERY 4 HOURS AS NEEDED FOR WHEEZING OR SHORTNESS OF BREATH 18 g 12  ? aspirin 81 MG tablet Take 81 mg by mouth daily.    ? Biotin 10000 MCG TBDP Take 10,000 mcg by mouth daily.    ? bisoprolol (ZEBETA) 5 MG tablet 1/2 tab po qd 45 tablet 3  ? Budeson-Glycopyrrol-Formoterol (BREZTRI AEROSPHERE) 160-9-4.8 MCG/ACT AERO Inhale 2 puffs into the lungs 2 (two) times daily. 10.7 g 6  ? CALCIUM-MAGNESIUM-ZINC PO Take 1 capsule by mouth at bedtime.    ? Cholecalciferol (VITAMIN D3) 2000 UNITS TABS Take 2,000 Units by mouth at bedtime.    ?  famotidine (PEPCID) 20 MG tablet One after supper 90 tablet 1  ? gabapentin (NEURONTIN) 300 MG capsule Take 1 capsule (300 mg total) by mouth 4 (four) times daily. (Patient taking differently: Take 300 mg by mouth 2 (two) times daily.) 120 capsule 5  ? ipratropium-albuterol (DUONEB) 0.5-2.5 (3) MG/3ML SOLN USE 3 ML VIA NEBULIZER EVERY 6 HOURS AS NEEDED 180 mL 1  ? montelukast (SINGULAIR) 10 MG tablet TAKE 1 TABLET(10 MG) BY MOUTH AT BEDTIME 90 tablet 2  ? oxyCODONE-acetaminophen (PERCOCET) 10-325 MG tablet Take 1 tablet by mouth 4  (four) times daily as needed.    ? pantoprazole (PROTONIX) 40 MG tablet TAKE 1 TABLET BY MOUTH 30 MINUTES BEFORE FIRST MEAL OF THE DAY 90 tablet 2  ? rosuvastatin (CRESTOR) 20 MG tablet TAKE 1 TABLET(20 MG) BY MOUTH AT BEDTIME 90 tablet 1  ? Semaglutide, 1 MG/DOSE, (OZEMPIC, 1 MG/DOSE,) 4 MG/3ML SOPN Inject 1 mg into the skin once a week. Friday 3 mL 3  ? Accu-Chek FastClix Lancets MISC Inject 1 each as directed daily. Use as directed to check blood sugars daily dx: e11.22 (Patient not taking: Reported on 01/21/2022) 50 each 11  ? Coenzyme Q10 30 MG CHEW Chew 60 mg by mouth daily. (Patient not taking: Reported on 04/09/2022)    ? DULoxetine (CYMBALTA) 30 MG capsule Take 1 capsule (30 mg total) by mouth daily. 30 capsule 2  ? glucose blood (ACCU-CHEK GUIDE) test strip 1 each by Other route daily. Use as instructed to check blood sugars 1 time per day dx: e11.22 (Patient not taking: Reported on 04/30/2022) 50 each 11  ? lidocaine (LIDODERM) 5 % Place 1 patch onto the skin daily as needed (pain). Remove & Discard patch within 12 hours or as directed by MD (Patient not taking: Reported on 04/30/2022)    ? ?No current facility-administered medications for this encounter.  ? ? ?Physical Findings: ?The patient is in no acute distress. Patient is alert and oriented. ? height is 5\' 5"  (1.651 m) and weight is 184 lb 2 oz (83.5 kg). Her temporal temperature is 97.2 ?F (36.2 ?C) (abnormal). Her blood pressure is 137/66 and her pulse is 86. Her respiration is 18 and oxygen saturation is 97%. .  Lungs are clear to auscultation bilaterally, except for some mild wheezing in the right midlung field.  Heart has regular rate and rhythm. No palpable cervical, supraclavicular, or axillary adenopathy. Abdomen soft, non-tender, normal bowel sounds.  Palpation along the right lateral rib cage area reveals some tenderness along one of the lateral ribs.  No palpable swelling or mass noted in this area. ? ? ?Lab Findings: ?Lab Results  ?Component  Value Date  ? WBC 4.5 03/29/2022  ? HGB 13.8 03/29/2022  ? HCT 42.2 03/29/2022  ? MCV 91.9 03/29/2022  ? PLT 352 03/29/2022  ? ? ?Radiographic Findings: ?No results found. ? ?Impression:   Adenocarcinoma - RUL pulmonary nodule, clinical stage I ? ?No evidence of recurrence on clinical exam today.  Recent chest CT scan encouraging with shrinkage of the solitary pulmonary nodule.  No other new problems.  Recent CT scan did not show any significant changes in the right lateral rib cage area to explain her pain.   no fractures along this area.  She denies any trauma to the right lateral rib cage area. ? ?Plan: She will be seen in routine follow-up in early October.  Prior to this follow-up appointment she will undergo a repeat CT scan of the chest  to follow-up of her treatment for her solitary right upper lobe pulmonary nodule. ? ? ?22 minutes of total time was spent for this patient encounter, including preparation, face-to-face counseling with the patient and coordination of care, physical exam, and documentation of the encounter. ?____________________________________ ? ?Blair Promise, PhD, MD ? ?This document serves as a record of services personally performed by Gery Pray, MD. It was created on his behalf by Roney Mans, a trained medical scribe. The creation of this record is based on the scribe's personal observations and the provider's statements to them. This document has been checked and approved by the attending provider. ? ?

## 2022-04-30 ENCOUNTER — Other Ambulatory Visit: Payer: Self-pay

## 2022-04-30 ENCOUNTER — Encounter: Payer: Self-pay | Admitting: Radiation Oncology

## 2022-04-30 ENCOUNTER — Ambulatory Visit
Admission: RE | Admit: 2022-04-30 | Discharge: 2022-04-30 | Disposition: A | Discharge status: Home / Self Care | Payer: Medicare PPO | Source: Ambulatory Visit | Attending: Radiation Oncology | Admitting: Radiation Oncology

## 2022-04-30 ENCOUNTER — Telehealth: Payer: Self-pay

## 2022-04-30 VITALS — BP 137/66 | HR 86 | Temp 97.2°F | Resp 18 | Ht 65.0 in | Wt 184.1 lb

## 2022-04-30 DIAGNOSIS — Z7982 Long term (current) use of aspirin: Secondary | ICD-10-CM | POA: Insufficient documentation

## 2022-04-30 DIAGNOSIS — Z79899 Other long term (current) drug therapy: Secondary | ICD-10-CM | POA: Insufficient documentation

## 2022-04-30 DIAGNOSIS — Z923 Personal history of irradiation: Secondary | ICD-10-CM | POA: Diagnosis not present

## 2022-04-30 DIAGNOSIS — R911 Solitary pulmonary nodule: Secondary | ICD-10-CM | POA: Diagnosis not present

## 2022-04-30 NOTE — Progress Notes (Signed)
[name] is here today for follow up post radiation to the lung. ? ?Lung Side: right ? ?Completed radiation treatment on: 12/11/2021 ? ?Does the patient complain of any of the following: ?Pain:upper back radiating under right arm to under right breast ?Shortness of breath w/wo exertion: shortness of breath with activity ?Cough: productive cough with whitish phlegm ?Hemoptysis: denies ?Pain with swallowing: denies ?Swallowing/choking concerns: denies ?Appetite: fair ?Energy Level: poor ?Post radiation skin Changes: denies ? ? ? ?Additional comments if applicable: nothing of note ? ?Vitals:  ? 04/30/22 1142  ?BP: 137/66  ?Pulse: 86  ?Resp: 18  ?Temp: (!) 97.2 ?F (36.2 ?C)  ?TempSrc: Temporal  ?SpO2: 97%  ?Weight: 184 lb 2 oz (83.5 kg)  ?Height: 5\' 5"  (1.651 m)  ? ? ? ?

## 2022-04-30 NOTE — Telephone Encounter (Signed)
Due to muscle aches Dr.[name] recommends pt stop her rosuvastatin to see if her symptoms resolve. Pt was advised to f/u with [place] in one week. YL,RMA ?

## 2022-05-01 ENCOUNTER — Encounter: Payer: Self-pay | Admitting: Internal Medicine

## 2022-05-01 ENCOUNTER — Ambulatory Visit (INDEPENDENT_AMBULATORY_CARE_PROVIDER_SITE_OTHER): Payer: Medicare PPO

## 2022-05-01 ENCOUNTER — Ambulatory Visit: Payer: Medicare PPO | Admitting: Internal Medicine

## 2022-05-01 DIAGNOSIS — J449 Chronic obstructive pulmonary disease, unspecified: Secondary | ICD-10-CM

## 2022-05-01 NOTE — Progress Notes (Signed)
? ?Subjective:  ? ?Patient ID: [name], female    DOB: 08/20/1946    MRN: [id number] ?  ? ?Brief patient profile:  ?71 yobf mother of a PA @ [place]  fast med  quit smoking 2007 with chronic cough x 2011 self referred for evaluation of persistent cough in setting of technically GOLD II copd dx 02/2014 with only mild obstructive pattern - has noted since high school a tendency to daily  tickle in throat causing intermittent cough waxing and waning ever since  ? ? ?History of Present Illness  ?10/19/2014 1st [facility] [place] [facility] office visit/ [name]   ?Chief Complaint  ?Patient presents with  ? Pulmonary Consult  ?  Former pt of Dr. Annamaria Horton. Pt c/o increased cough x 3 wks. Cough is prod with moderate, thick, light yellow sputum.  She coughs until loses urinary continence.  Not sleeping well due to cough.   ?remembers having tendency to throat tickle in high school  but completely resolved by graduation  then recurred in her 47s and resolved with codeine then recurred in 2011  but bad to worse x 3 weeks assoc with sensation of choking, mucus is thick light yellow.  acei stopped about a month prior to OV  Replaced by losartan  ?Inhalers make it worse, esp dpi  ?[name] Reflux Comments  ?Do you awaken from a sound sleep coughing violently?                            ?With trouble breathing? Yes   ?Do you have choking episodes when you cannot  Get enough air, gasping for air ?             ? Yes   ?Do you usually cough when you lie down into  The bed, or when you just lie down to rest ?                         ? Yes   ?Do you usually cough after meals or eating?         maybe   ?Do you cough when (or after) you bend over?    No    ?GERD SCORE     ?[name] Neurogenic   ?Do you more-or-less cough all day long? yes   ?Does change of temperature make you cough? no   ?Does laughing or chuckling cause you to cough? no   ?Do fumes (perfume,  automobile fumes, burned  Toast, etc.,) cause you to cough ?      Not aware   ?Does speaking, singing, or talking on the phone cause you to cough   ?              ? sometimes   ?Neurogenic/Airway score    ? ? ?rec  ?Max rx gerd/prn tramadol  ?  ? ?  ? ?09/15/2017  Transition of care  f/u ov/[name] re: COPD II s/p aecopd ?Chief Complaint  ?Patient presents with  ? Hospitalization Follow-up  ?  Breathing has improved some, but not back at her normal baseline. She states her cough woke her up this am and had some bloody nasal d/c.  She has been coughing up some thick, yellow sputum.  She is using   ?walking all over [place] fine,  Flew back to [place]  on a commercial jet and short of  breath  walking from gate [place] then by 2 days later in ER due to severe sob / tightness across upper back >> albuterol relived the discomfort w/in 5 min but did not have her rescue so had to call 911 (proair listed on med calendar she just got from me 08/19/17 under written action plan) - says was so short of breath at that point could not have used saba anyway. ?Not following action plan either re controlling cough which is also worse/ has not started meds from discharge  ?rec ?Plan A = Automatic = symbicort 80 Take 2 puffs first thing in am and then another 2 puffs about 12 hours later.  ?Work on Interior and spatial designer: ?Plan B = Backup ?Only use your albuterol as a rescue medication  ? ?   ? ? ?11/21/2020  f/u ov/[name] re:  GOLD II / breztri better than symbicort  ?Chief Complaint  ?Patient presents with  ? Follow-up  ?  She states she has alot of SOB when she wakes up in the morning. She rarely uses her albuterol inhaler, but has been using neb almost every night.   ?Dyspnea:  Gets up around 630 am does better after taking am breztri   ?Cough: sporadic but dry  / less need for tramadol on gabapentin usually just taking 300 bid  ?Sleeping: fine 2 pillows  ?SABA use: every other night sev hours p supper  feels like needs  nebulizer even p pm dose of breztri ?02: none ?rec ?Change gabapentin to 300 mg 4 x daily  ?Change pepcid 20 mg after supper  ?Only use your albuterol as a rescue medication  ?Bed blocks are strongly recommended  ? ? ?  ?07/23/21 televisit ?Plan A = Automatic = Always=    Breztri Take 2 puffs first thing in am and then another 2 puffs about 12 hours later.  ?Gabapentin 300 mg four times daily until cough is gone then taper twice daily.  ?Plan B = Backup (to supplement plan A, not to replace it) ?Only use your albuterol inhaler as a rescue medication  ?Plan C = Crisis (instead of Plan B but only if Plan B stops working) ?- only use your albuterol nebulizer if you first try Plan B and it fails to help > ok to use the nebulizer up to every 4 hours but if start needing it regularly call for immediate appointment ?Prednisone 10 mg take  4 each am x 2 days,   2 each am x 2 days,  1 each am x 2 days and stop  ?Please schedule a follow up office visit in 6 weeks, call sooner if needed - bring all your medications /inhalers/ solutions ?Check cxr  08/05/21 ? 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 ?RT curative  12/04/2021 through 12/11/2021 ? ?05/01/2022 acute extended ov/[name] re: sob/cough/ wheeze maint on breztri 2 bid  ?Chief Complaint  ?Patient presents with  ? Acute Visit  ?  Increased SOB, cough and wheezing x 3 days. SOB waking her up. She is coughing up thick, off white sputum.   ?Worse x 3 days / no recent  prednisone assoc with worse cough x sev weeks silvery mucus no blood / assoc subjective wheeze/ no obvious assoc rhinitis / mild dysphagia but no worse p meals  ?L cp now seeing neurology / pain clinic / not pleuritic  ? ?No obvious day to day or daytime variability  or assoc  mucus plugs or hemoptysis or cp or chest tightness, subjective wheeze or overt sinus or hb symptoms.  ? ? Also denies any obvious fluctuation of symptoms with weather or environmental changes or other aggravating or alleviating  factors except as outlined above  ? ?No unusual exposure hx or h/o childhood pna/ asthma or knowledge of premature birth. ? ?Current Allergies, Complete Past Medical History, Past Surgical History, Family History, and Social History were reviewed in [employer] record. ? ?ROS  The following are not active complaints unless bolded ?Hoarseness, sore throat, dysphagia, dental problems, itching, sneezing,  nasal congestion or discharge of excess mucus or purulent secretions, ear ache,   fever, chills, sweats, unintended wt loss or wt gain, classically pleuritic or exertional cp,  orthopnea pnd or arm/hand swelling  or leg swelling, presyncope, palpitations, abdominal pain, anorexia, nausea, vomiting, diarrhea  or change in bowel habits or change in bladder habits, change in stools or change in urine, dysuria, hematuria,  rash, arthralgias, visual complaints, headache, numbness, weakness or ataxia or problems with walking or coordination,  change in mood or  memory. ?      ?   ? ? ? ?  ?  ?  ?  ?  ?   ?   ?Objective:  ? Physical Exam ? ?05/01/2022        185 ?09/13/2021      197   ?02/26/2021      191 ?11/21/2020    191  ?04/16/2019      193 ?07/22/2018      195 ?04/20/2018      189  ?10/23/2016     190 ?04/07/2015         207   ?  ?Vital signs reviewed  05/01/2022  - Note at rest 02 sats  95% on RA  ? ?General appearance:    amb bf mod hoarse/ nad  ? ?HEENT : nl exam  ? ? ?NECK :  without JVD/Nodes/TM/ nl carotid upstrokes bilaterally ? ? ?LUNGS: no acc muscle use,  Mod barrel  contour chest wall with bilateral  insp/exp rhonchi s audible wheeze and  without cough on insp or exp maneuvers and mod  Hyperresonant  to  percussion bilaterally   ? ? ?CV:  RRR  no s3 or murmur or increase in P2, and no edema  ? ?ABD:  soft and nontender with pos mid insp Hoover's  in the supine position. No bruits or organomegaly appreciated, bowel sounds nl ? ?MS:     ext warm without deformities, calf tenderness, cyanosis or  clubbing ?No obvious joint restrictions  ? ?SKIN: warm and dry without lesions   ? ?NEURO:  alert, approp, nl sensorium with  no motor or cerebellar deficits apparent.  ?    ? ?CXR PA and Lateral:   05/01/2022 :

## 2022-05-01 NOTE — Patient Instructions (Addendum)
Plan A = Automatic = Always=   Breztri Take 2 puffs first thing in am and then another 2 puffs about 12 hours later.  ?Gabapentin 300 mg four times a daily  ?  ?Work on inhaler technique:  relax and gently blow all the way out then take a nice smooth full deep breath back in, triggering the inhaler at same time you start breathing in.  Hold for up to 5 seconds if you can. Blow out thru nose. Rinse and gargle with water when done.  If mouth or throat bother you at all,  try brushing teeth/gums/tongue with arm and hammer toothpaste/ make a slurry and gargle and spit out.  ? ?Plan B = Backup (to supplement plan A, not to replace it) (unless cough is the issue in which case more on to plan C) ?Only use your albuterol inhaler as a rescue medication to be used if you can't catch your breath by resting or doing a relaxed purse lip breathing pattern.  ?- The less you use it, the better it will work when you need it. ?- Ok to use the inhaler up to 2 puffs  every 4 hours if you must but call for appointment if use goes up over your usual need ?- Don't leave home without it !!  (think of it like spare time  for your car)  ? ?Plan C = Crisis (instead of Plan B but only if Plan B stops working) ?- only use your albuterol nebulizer (ok to use up duoneb which has ipatrpium) if you first try Plan B and it fails to help > ok to use the nebulizer up to every 4 hours but if start needing it regularly call for immediate appointment ? ? ?Plan D = Deltasone 10 mg  if  ABC not helping  ?Prednisone 10 mg take  4 each am x 2 days,   2 each am x 2 days,  1 each am x 2 days and stop  ? ?For cough > mucinex dm 1200 mg every 12 hours as needed as use flutter valve with oxycodone supplement  ? ?Please remember to go to the  x-ray department  for your tests - we will call you with the results when they are available  ? ? ?Please schedule a follow up visit in 3 months but call sooner if needed   ? ? ? ? ? ? ?  ? ? ? ?

## 2022-05-01 NOTE — Assessment & Plan Note (Addendum)
Quit smoking 2007 ?PFT: 03/17/2014-  FEV1  1.28 (58%) ratio 66 and no better p saba, ERV 27 and DLCO 56 corrects to 77 ?- 11/30/2014   try dulera 100 2bid > 01/03/2015 not clear helping,  use prn > flare since mid 02/2015 cough > sob  ?- 04/07/2015 p extensive coaching HFA effectiveness =    90% > try dulera 100 2bid > improved 04/21/15   ?- 06/01/2015   try dulera 200 2bid  > did not maintain  ?- Spirometry 04/14/2017  FEV1 1.13 (58%)  Ratio 52 p saba w/in 4 h > add symb 80 2bid   ?- 08/19/2017    resumed sym 80 2bid ?-  03/23/2018    try symb 160 due to new noct cough/ wheeze > improved 04/20/2018  ?- 07/22/2018   try symb 80 2bid due to UACS ?- Pulmonary eval at [facility] 2/267/20 rec add spiriva smi > pt declined and did not return for f/u  ? ?05/01/2022  After extensive coaching inhaler device,  effectiveness = 75% (short Ti)      ? ?Mild flare in setting of chronic upper airway cough and gerd  ? ?Of the three most common causes of  Sub-acute / recurrent or chronic cough, only one (GERD)  can actually contribute to/ trigger  the other two (asthma and post nasal drip syndrome)  and perpetuate the cylce of cough. ? ?While not intuitively obvious, many patients with chronic low grade reflux do not cough until there is a primary insult that disturbs the protective epithelial barrier and exposes sensitive nerve endings.   This is typically viral but can due to PNDS and  either may apply here.   The point is that once this occurs, it is difficult to eliminate the cycle  using anything but a maximally effective acid suppression regimen at least in the short run, accompanied by an appropriate diet to address non acid GERD and control / eliminate the cough itself for at least 3 days with oxycodone and maintain on gabapenitn 300 mg qid  >>> also so added 6 day taper off  Prednisone starting at 40 mg per day in case of component of Th-2 driven upper or lower airways inflammation (if cough responds short term only to relapse before return while  will on full rx for uacs (as above), then  that would point to allergic rhinitis/ asthma or eos bronchitis as alternative dx)  ? ? ?    ?  ? ?Each maintenance medication was reviewed in detail including emphasizing most importantly the difference between maintenance and prns and under what circumstances the prns are to be triggered using an action plan format where appropriate. ? ?Total time for H and P, chart review, counseling, reviewing hfa/neb device(s) and generating customized AVS unique to this acute  office visit / same day charting = 32 min  ?     ? ? ? ? ?

## 2022-05-01 NOTE — Telephone Encounter (Signed)
Spoke with the pt and scheduled for appt with Dr [name] for 10:15 today. Aware to bring all meds. ?

## 2022-05-02 ENCOUNTER — Encounter: Payer: Self-pay | Admitting: Internal Medicine

## 2022-05-03 ENCOUNTER — Telehealth: Payer: Self-pay

## 2022-05-03 NOTE — Chronic Care Management (AMB) (Signed)
05-03-2022: Contacted [facility] to follow up on replacement order and was told medication was returned AGAIN on Friday 04-19-2022 because office was closed. Stressed to the representative that this is the 2nd time this has happened and we notate on all applications that the office is closed Friday-Sunday. Asked if there are any other actions we can take to prevent this from happening since it happens quite often and representative apologized and noted on replacement form that office is closed on Fridays. Voucher was provided. Updated patient with info to provide at pharmacy. Patient has 2 refills left ?BIN [id number] ?PCN [id number] ?GP [id number] ?ID [id number] ? ?

## 2022-05-07 ENCOUNTER — Other Ambulatory Visit: Payer: Self-pay

## 2022-05-07 ENCOUNTER — Encounter: Payer: Self-pay | Admitting: Internal Medicine

## 2022-05-07 MED ORDER — HYDROCORTISONE (PERIANAL) 2.5 % EX CREA: 1.0000 "application " | TOPICAL_CREAM | Freq: Two times a day (BID) | CUTANEOUS | 0 refills | Status: DC

## 2022-05-14 ENCOUNTER — Other Ambulatory Visit: Payer: Self-pay | Admitting: Internal Medicine

## 2022-05-14 MED ORDER — ATORVASTATIN CALCIUM 40 MG PO TABS: ORAL_TABLET | ORAL | 0 refills | Status: DC

## 2022-05-15 ENCOUNTER — Other Ambulatory Visit: Payer: Self-pay

## 2022-05-16 ENCOUNTER — Ambulatory Visit: Payer: Medicare PPO | Admitting: Radiation Oncology

## 2022-05-22 ENCOUNTER — Other Ambulatory Visit: Payer: Self-pay | Admitting: Internal Medicine

## 2022-05-22 ENCOUNTER — Encounter: Payer: Self-pay | Admitting: Internal Medicine

## 2022-05-22 DIAGNOSIS — K649 Unspecified hemorrhoids: Secondary | ICD-10-CM

## 2022-05-22 DIAGNOSIS — R151 Fecal smearing: Secondary | ICD-10-CM

## 2022-05-28 ENCOUNTER — Ambulatory Visit: Payer: Medicare PPO | Admitting: Internal Medicine

## 2022-05-28 ENCOUNTER — Encounter: Payer: Self-pay | Admitting: Internal Medicine

## 2022-05-28 VITALS — BP 110/78 | HR 86 | Temp 98.5°F | Ht 65.0 in | Wt 175.4 lb

## 2022-05-28 DIAGNOSIS — G894 Chronic pain syndrome: Secondary | ICD-10-CM | POA: Diagnosis not present

## 2022-05-28 DIAGNOSIS — R413 Other amnesia: Secondary | ICD-10-CM

## 2022-05-28 DIAGNOSIS — J449 Chronic obstructive pulmonary disease, unspecified: Secondary | ICD-10-CM

## 2022-05-28 DIAGNOSIS — E785 Hyperlipidemia, unspecified: Secondary | ICD-10-CM

## 2022-05-28 DIAGNOSIS — E1169 Type 2 diabetes mellitus with other specified complication: Secondary | ICD-10-CM | POA: Diagnosis not present

## 2022-05-28 DIAGNOSIS — C3491 Malignant neoplasm of unspecified part of right bronchus or lung: Secondary | ICD-10-CM | POA: Insufficient documentation

## 2022-05-28 DIAGNOSIS — Z6829 Body mass index (BMI) 29.0-29.9, adult: Secondary | ICD-10-CM

## 2022-05-28 DIAGNOSIS — I700 Atherosclerosis of aorta: Secondary | ICD-10-CM

## 2022-05-28 MED ORDER — DULOXETINE HCL 30 MG PO CPEP: 30.0000 mg | ORAL_CAPSULE | Freq: Every day | ORAL | 2 refills | Status: DC

## 2022-05-28 NOTE — Patient Instructions (Signed)

## 2022-05-28 NOTE — Progress Notes (Signed)
[name],acting as a Education administrator for Tammy Greenland, MD.,have documented all relevant documentation on the behalf of Tammy Greenland, MD,as directed by  Tammy Greenland, MD while in the presence of Tammy Greenland, MD.  This visit occurred during the SARS-CoV-2 public health emergency.  Safety protocols were in place, including screening questions prior to the visit, additional usage of staff PPE, and extensive cleaning of exam room while observing appropriate contact time as indicated for disinfecting solutions.  Subjective:     Patient ID: [name] , female    DOB: [birth year] , 76 y.o.   MRN: [id number]   Chief Complaint  Patient presents with   Pain   Hyperlipidemia    HPI  Patient presents today for a cholesterol f/u. Patient was recently switched to atorvastatin. She is tolerating the medication without any adverse effects.   Hyperlipidemia This is a chronic problem. The current episode started more than 1 year ago. The problem is uncontrolled. Exacerbating diseases include diabetes. Pertinent negatives include no shortness of breath. Current antihyperlipidemic treatment includes statins. The current treatment provides moderate improvement of lipids. Risk factors for coronary artery disease include diabetes mellitus, hypertension, post-menopausal and dyslipidemia.  Hypertension This is a chronic problem. The current episode started more than 1 year ago. The problem has been gradually improving since onset. The problem is controlled. Pertinent negatives include no palpitations or shortness of breath. The current treatment provides moderate improvement. Compliance problems include exercise.      Past Medical History:  Diagnosis Date   Arthritis    Asthma    Complication of anesthesia    COPD (chronic obstructive pulmonary disease) (HCC)    Diabetes ([place])    High cholesterol    History of radiation therapy    Right Lung- 12/04/21-12/11/21- Dr. Gery Horton    Hypertension    Lung cancer [facility])    Pneumonia 09/10/2017   PONV (postoperative nausea and vomiting)    Stroke ([place]) 2007   mini strokes     Family History  Problem Relation Age of Onset   Heart murmur Mother    Heart attack Father    Diabetes Father    Diabetes Brother      Current Outpatient Medications:    albuterol (VENTOLIN HFA) 108 (90 Base) MCG/ACT inhaler, INHALE 2 PUFFS INTO THE LUNGS EVERY 4 HOURS AS NEEDED FOR WHEEZING OR SHORTNESS OF BREATH, Disp: 18 g, Rfl: 12   aspirin 81 MG tablet, Take 81 mg by mouth daily., Disp: , Rfl:    atorvastatin (LIPITOR) 40 MG tablet, Take one tab po M-F and skip the weekends, Disp: 75 tablet, Rfl: 0   Biotin 10000 MCG TBDP, Take 10,000 mcg by mouth daily., Disp: , Rfl:    bisoprolol (ZEBETA) 5 MG tablet, 1/2 tab po qd, Disp: 45 tablet, Rfl: 3   Budeson-Glycopyrrol-Formoterol (BREZTRI AEROSPHERE) 160-9-4.8 MCG/ACT AERO, Inhale 2 puffs into the lungs 2 (two) times daily., Disp: 10.7 g, Rfl: 6   CALCIUM-MAGNESIUM-ZINC PO, Take 1 capsule by mouth at bedtime., Disp: , Rfl:    Cholecalciferol (VITAMIN D3) 2000 UNITS TABS, Take 2,000 Units by mouth at bedtime., Disp: , Rfl:    DULoxetine (CYMBALTA) 30 MG capsule, Take 1 capsule (30 mg total) by mouth daily., Disp: 30 capsule, Rfl: 2   famotidine (PEPCID) 20 MG tablet, One after supper, Disp: 90 tablet, Rfl: 1   gabapentin (NEURONTIN) 400 MG capsule, Take 400 mg by mouth 4 (four) times daily., Disp: , Rfl:  glucose blood (ACCU-CHEK GUIDE) test strip, 1 each by Other route daily. Use as instructed to check blood sugars 1 time per day dx: e11.22, Disp: 50 each, Rfl: 11   hydrocortisone (PROCTOSOL HC) 2.5 % rectal cream, Place 1 application. rectally 2 (two) times daily., Disp: 30 g, Rfl: 0   ipratropium-albuterol (DUONEB) 0.5-2.5 (3) MG/3ML SOLN, USE 3 ML VIA NEBULIZER EVERY 6 HOURS AS NEEDED, Disp: 180 mL, Rfl: 1   Magnesium 250 MG TABS, Take 1 tablet by mouth daily., Disp: , Rfl:    montelukast  (SINGULAIR) 10 MG tablet, TAKE 1 TABLET(10 MG) BY MOUTH AT BEDTIME, Disp: 90 tablet, Rfl: 2   oxyCODONE-acetaminophen (PERCOCET) 10-325 MG tablet, Take 1 tablet by mouth 4 (four) times daily as needed., Disp: , Rfl:    pantoprazole (PROTONIX) 40 MG tablet, TAKE 1 TABLET BY MOUTH 30 MINUTES BEFORE FIRST MEAL OF THE DAY, Disp: 90 tablet, Rfl: 2   Semaglutide, 1 MG/DOSE, (OZEMPIC, 1 MG/DOSE,) 4 MG/3ML SOPN, Inject 1 mg into the skin once a week. Friday, Disp: 3 mL, Rfl: 3   Allergies  Allergen Reactions   Other Swelling    HAIR DYE     Review of Systems  Constitutional: Negative.   Respiratory: Negative.  Negative for shortness of breath.   Cardiovascular: Negative.  Negative for palpitations.  Gastrointestinal: Negative.   Neurological: Negative.        Still having lots of pain. Followed by [facility].   Also c/o memory issues. Feels she isn't as sharp. Not sure if related to her current meds.   Psychiatric/Behavioral: Negative.       Today's Vitals   05/28/22 0938  BP: 110/78  Pulse: 86  Temp: 98.5 F (36.9 C)  Weight: 175 lb 6.4 oz (79.6 kg)  Height: 5\' 5"  (1.651 m)  PainSc: 2    Body mass index is 29.19 kg/m.  Wt Readings from Last 3 Encounters:  05/28/22 175 lb 6.4 oz (79.6 kg)  05/01/22 185 lb (83.9 kg)  04/30/22 184 lb 2 oz (83.5 kg)     Objective:  Physical Exam Vitals and nursing note reviewed.  Constitutional:      Appearance: Normal appearance.  HENT:     Head: Normocephalic and atraumatic.  Eyes:     Extraocular Movements: Extraocular movements intact.  Cardiovascular:     Rate and Rhythm: Normal rate and regular rhythm.     Heart sounds: Normal heart sounds.  Pulmonary:     Effort: Pulmonary effort is normal.     Breath sounds: Wheezing present.  Musculoskeletal:     Cervical back: Normal range of motion.  Skin:    General: Skin is warm.  Neurological:     General: No focal deficit present.     Mental Status: She is alert.   Psychiatric:        Mood and Affect: Mood normal.        Behavior: Behavior normal.         Assessment And Plan:     1. Dyslipidemia associated with type 2 diabetes mellitus ([place]) Comments: Chronic, she is now on atorvastatin. I will check fasting lipid panel and ALT today. LDL goal <70. - Lipid panel - ALT  2. Chronic pain syndrome Comments: I will resend rx duloxetine to her pharmacy. Advised to take in the evenings and of possible side effects. She has f/u appt July 2023.   3. Memory changes Comments: I will check labs as below. I will make  further recommendations once her labs are available for  review.  - Vitamin B12 - TSH - RPR  4. COPD GOLD II Comments: She will c/w Breztri. Advised to use albuterol nebulizer when she gets home.  5. Atherosclerosis of aorta (HCC) Comments: LDL goal <70. She is now on atorvastatin therapy. She was above goal Dec 2022.  6. 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  She is s/p XRT. Recent Radiation Oncology note reviewed in detail.   7. BMI 29.0-29.9,adult Comments: She is encouraged to increase her daily activity.    Patient was given opportunity to ask questions. Patient verbalized understanding of the plan and was able to repeat key elements of the plan. All questions were answered to their satisfaction.   I, Tammy Greenland, MD, have reviewed all documentation for this visit. The documentation on 05/28/22 for the exam, diagnosis, procedures, and orders are all accurate and complete.   IF YOU HAVE BEEN REFERRED TO A SPECIALIST, IT MAY TAKE 1-2 WEEKS TO SCHEDULE/PROCESS THE REFERRAL. IF YOU HAVE NOT HEARD FROM US/SPECIALIST IN TWO WEEKS, PLEASE GIVE [place] A CALL AT [phone].   THE PATIENT IS ENCOURAGED TO PRACTICE SOCIAL DISTANCING DUE TO THE COVID-19 PANDEMIC.

## 2022-05-29 LAB — LIPID PANEL
Chol/HDL Ratio: 4 ratio (ref 0.0–4.4)
Cholesterol, Total: 167 mg/dL (ref 100–199)
HDL: 42 mg/dL (ref >39)
LDL Chol Calc (NIH): 105 mg/dL — ABNORMAL HIGH (ref 0–99)
Triglycerides: 111 mg/dL (ref 0–149)
VLDL Cholesterol Cal: 20 mg/dL (ref 5–40)

## 2022-05-29 LAB — VITAMIN B12: Vitamin B-12: 489 pg/mL (ref 232–1245)

## 2022-05-29 LAB — TSH: TSH: 0.516 u[IU]/mL (ref 0.450–4.500)

## 2022-05-29 LAB — ALT: ALT: 16 IU/L (ref 0–32)

## 2022-05-29 LAB — RPR: RPR Ser Ql: NONREACTIVE

## 2022-05-30 ENCOUNTER — Encounter: Payer: Self-pay | Admitting: Internal Medicine

## 2022-06-03 ENCOUNTER — Telehealth: Payer: Self-pay

## 2022-06-03 NOTE — Progress Notes (Signed)
    Called [facility], No answer, left message of appointment on 06-05-2022 at 3:00 via telephone visit with [name], Pharm D. Notified to have all medications, supplements, blood pressure and/or blood sugar logs available during appointment and to return call if need to reschedule.   [place] Pharmacist Assistant [phone]

## 2022-06-03 NOTE — Telephone Encounter (Signed)
Patient notified, patient assistance came in, ready for pickup.

## 2022-06-04 ENCOUNTER — Telehealth: Payer: Self-pay

## 2022-06-04 NOTE — Chronic Care Management (AMB) (Addendum)
[employer] patient assistance program notification:  Patient currently enrolled in auto-refill, 120- day supply of Ozempic 1mg /ml will be filled on 06/09/2022 and should arrive to the office in 10-14 business days. Patient enrollment will expire on 11/28/2022.  [employer] notification- medication shipped on 05/16/2022 and to arrive at patient home 1-2 business days.   Pattricia Boss, [place] Pharmacist Assistant [phone]

## 2022-06-05 ENCOUNTER — Telehealth: Payer: Medicare PPO

## 2022-06-05 ENCOUNTER — Encounter: Payer: Self-pay | Admitting: Internal Medicine

## 2022-06-06 ENCOUNTER — Encounter: Payer: Self-pay | Admitting: Internal Medicine

## 2022-06-19 ENCOUNTER — Telehealth: Payer: Self-pay

## 2022-06-19 NOTE — Chronic Care Management (AMB) (Signed)
06-19-2022: Left patient a voicemail informing her that ozempic is ready for pickup and that the office is closed on Friday.  [place] Pharmacist Assistant [phone]

## 2022-07-01 ENCOUNTER — Telehealth: Payer: Self-pay

## 2022-07-01 NOTE — Chronic Care Management (AMB) (Signed)
[employer] patient assistance program notification:  120- day supply of Ozempic 1 mg was filled on 06/10/2022 and  should arrive to the office in 10-14 business days. Patient has 0  refill remaining and [employer] reorder form assigned to [facility], CMA to fill out, will be faxed to continue patient assistance refills.  Pattricia Boss, [place] Pharmacist Assistant [phone]

## 2022-07-03 ENCOUNTER — Telehealth: Payer: Self-pay

## 2022-07-03 NOTE — Chronic Care Management (AMB) (Signed)
07-03-2022: Reorder form for Ozempic 1 mg completed and uploaded to folder.  [place] Pharmacist Assistant [phone]

## 2022-07-03 NOTE — Chronic Care Management (AMB) (Signed)
Faxed reorder request form to [employer] patient assistance program for Ozempic 1mg .  [place], [place] Pharmacist Assistant [phone]

## 2022-07-07 ENCOUNTER — Encounter: Payer: Self-pay | Admitting: Internal Medicine

## 2022-07-07 ENCOUNTER — Other Ambulatory Visit: Payer: Self-pay

## 2022-07-07 MED ORDER — ATORVASTATIN CALCIUM 80 MG PO TABS: ORAL_TABLET | ORAL | 1 refills | Status: DC

## 2022-07-09 ENCOUNTER — Other Ambulatory Visit: Payer: Self-pay | Admitting: Internal Medicine

## 2022-07-18 ENCOUNTER — Encounter: Payer: Self-pay | Admitting: Internal Medicine

## 2022-07-18 ENCOUNTER — Ambulatory Visit: Payer: Medicare PPO | Admitting: Internal Medicine

## 2022-07-18 VITALS — BP 138/66 | HR 87 | Temp 98.4°F | Ht 65.0 in | Wt 164.0 lb

## 2022-07-18 DIAGNOSIS — E785 Hyperlipidemia, unspecified: Secondary | ICD-10-CM

## 2022-07-18 DIAGNOSIS — E1169 Type 2 diabetes mellitus with other specified complication: Secondary | ICD-10-CM

## 2022-07-18 DIAGNOSIS — I119 Hypertensive heart disease without heart failure: Secondary | ICD-10-CM | POA: Diagnosis not present

## 2022-07-18 DIAGNOSIS — E7800 Pure hypercholesterolemia, unspecified: Secondary | ICD-10-CM | POA: Diagnosis not present

## 2022-07-18 DIAGNOSIS — Z6827 Body mass index (BMI) 27.0-27.9, adult: Secondary | ICD-10-CM | POA: Diagnosis not present

## 2022-07-18 DIAGNOSIS — Z1211 Encounter for screening for malignant neoplasm of colon: Secondary | ICD-10-CM

## 2022-07-18 NOTE — Progress Notes (Signed)
[name],acting as a Education administrator for [name], MD.,have documented all relevant documentation on the behalf of [name], MD,as directed by  [name], MD while in the presence of [name], MD.    Subjective:     Patient ID: [name] , female    DOB: 06/08/1946 , 76 y.o.   MRN: [id number]   Chief Complaint  Patient presents with   Diabetes   Hypertension   Hyperlipidemia    HPI  Patient presents today for a diabetes and chol check. Patient reports compliance with her meds. Unfortunately, she is still having severe side pain. She is still followed by [facility].   Diabetes She presents for her follow-up diabetic visit. She has type 2 diabetes mellitus. Her disease course has been stable. There are no hypoglycemic associated symptoms. Pertinent negatives for hypoglycemia include no dizziness or headaches. Pertinent negatives for diabetes include no blurred vision, no chest pain, no fatigue, no polydipsia, no polyphagia and no polyuria. There are no hypoglycemic complications. Risk factors for coronary artery disease include diabetes mellitus, dyslipidemia, hypertension, obesity, sedentary lifestyle and post-menopausal. She has not had a previous visit with a dietitian. She participates in exercise three times a week. Her breakfast blood glucose is taken between 8-9 am. Her breakfast blood glucose range is generally 110-130 mg/dl. Eye exam is current.  Hyperlipidemia This is a chronic problem. The current episode started more than 1 year ago. The problem is uncontrolled. Exacerbating diseases include diabetes. Pertinent negatives include no chest pain. Current antihyperlipidemic treatment includes statins. The current treatment provides moderate improvement of lipids. Risk factors for coronary artery disease include diabetes mellitus, hypertension, post-menopausal and dyslipidemia.  Hypertension This is a chronic problem. The current episode  started more than 1 year ago. The problem has been gradually improving since onset. The problem is controlled. Pertinent negatives include no blurred vision, chest pain, headaches or palpitations. The current treatment provides moderate improvement. Compliance problems include exercise.      Past Medical History:  Diagnosis Date   Arthritis    Asthma    Complication of anesthesia    COPD (chronic obstructive pulmonary disease) (HCC)    Diabetes ([place])    High cholesterol    History of radiation therapy    Right Lung- 12/04/21-12/11/21- Dr. Gery Pray   Hypertension    Lung cancer [facility])    Pneumonia 09/10/2017   PONV (postoperative nausea and vomiting)    Stroke ([place]) 2007   mini strokes     Family History  Problem Relation Age of Onset   Heart murmur Mother    Heart attack Father    Diabetes Father    Diabetes Brother      Current Outpatient Medications:    albuterol (VENTOLIN HFA) 108 (90 Base) MCG/ACT inhaler, INHALE 2 PUFFS INTO THE LUNGS EVERY 4 HOURS AS NEEDED FOR WHEEZING OR SHORTNESS OF BREATH, Disp: 18 g, Rfl: 12   aspirin 81 MG tablet, Take 81 mg by mouth daily., Disp: , Rfl:    atorvastatin (LIPITOR) 80 MG tablet, Take one tab po M-F and skip the weekends, Disp: 90 tablet, Rfl: 1   Biotin 10000 MCG TBDP, Take 10,000 mcg by mouth daily., Disp: , Rfl:    bisoprolol (ZEBETA) 5 MG tablet, 1/2 tab po qd, Disp: 45 tablet, Rfl: 3   Budeson-Glycopyrrol-Formoterol (BREZTRI AEROSPHERE) 160-9-4.8 MCG/ACT AERO, Inhale 2 puffs into the lungs 2 (two) times daily., Disp: 10.7 g, Rfl: 6  CALCIUM-MAGNESIUM-ZINC PO, Take 1 capsule by mouth at bedtime., Disp: , Rfl:    Cholecalciferol (VITAMIN D3) 2000 UNITS TABS, Take 2,000 Units by mouth at bedtime., Disp: , Rfl:    famotidine (PEPCID) 20 MG tablet, One after supper, Disp: 90 tablet, Rfl: 1   gabapentin (NEURONTIN) 400 MG capsule, Take 400 mg by mouth 4 (four) times daily., Disp: , Rfl:    glucose blood (ACCU-CHEK GUIDE) test  strip, 1 each by Other route daily. Use as instructed to check blood sugars 1 time per day dx: e11.22, Disp: 50 each, Rfl: 11   hydrocortisone (PROCTOSOL HC) 2.5 % rectal cream, Place 1 application. rectally 2 (two) times daily., Disp: 30 g, Rfl: 0   ipratropium-albuterol (DUONEB) 0.5-2.5 (3) MG/3ML SOLN, USE 3 ML VIA NEBULIZER EVERY 6 HOURS AS NEEDED, Disp: 180 mL, Rfl: 1   Magnesium 250 MG TABS, Take 1 tablet by mouth daily., Disp: , Rfl:    montelukast (SINGULAIR) 10 MG tablet, TAKE 1 TABLET(10 MG) BY MOUTH AT BEDTIME, Disp: 90 tablet, Rfl: 2   oxyCODONE-acetaminophen (PERCOCET) 10-325 MG tablet, Take 1 tablet by mouth 4 (four) times daily as needed., Disp: , Rfl:    pantoprazole (PROTONIX) 40 MG tablet, TAKE 1 TABLET BY MOUTH 30 MINUTES BEFORE FIRST MEAL OF THE DAY, Disp: 90 tablet, Rfl: 2   Semaglutide, 1 MG/DOSE, (OZEMPIC, 1 MG/DOSE,) 4 MG/3ML SOPN, Inject 1 mg into the skin once a week. Friday, Disp: 3 mL, Rfl: 3   DULoxetine (CYMBALTA) 60 MG capsule, Take 1 capsule (60 mg total) by mouth daily., Disp: 90 capsule, Rfl: 1   Allergies  Allergen Reactions   Other Swelling    HAIR DYE     Review of Systems  Constitutional: Negative.  Negative for fatigue.  Eyes: Negative.  Negative for blurred vision.  Respiratory: Negative.    Cardiovascular: Negative.  Negative for chest pain and palpitations.  Endocrine: Negative for polydipsia, polyphagia and polyuria.  Skin: Negative.   Neurological: Negative.  Negative for dizziness and headaches.  Psychiatric/Behavioral: Negative.       Today's Vitals   07/18/22 1209  BP: 138/66  Pulse: 87  Temp: 98.4 F (36.9 C)  Weight: 164 lb (74.4 kg)  Height: _0  (1.651 m)  PainSc: 0-No pain   Body mass index is 27.29 kg/m.  Wt Readings from Last 3 Encounters:  07/18/22 164 lb (74.4 kg)  05/28/22 175 lb 6.4 oz (79.6 kg)  05/01/22 185 lb (83.9 kg)     Objective:  Physical Exam Vitals and nursing note reviewed.  Constitutional:       Appearance: Normal appearance.  HENT:     Head: Normocephalic and atraumatic.  Eyes:     Extraocular Movements: Extraocular movements intact.  Cardiovascular:     Rate and Rhythm: Normal rate and regular rhythm.     Heart sounds: Normal heart sounds.  Pulmonary:     Effort: Pulmonary effort is normal.     Breath sounds: Normal breath sounds.  Skin:    General: Skin is warm.  Neurological:     General: No focal deficit present.     Mental Status: She is alert.  Psychiatric:        Mood and Affect: Mood normal.        Behavior: Behavior normal.      Assessment And Plan:     1. Dyslipidemia associated with type 2 diabetes mellitus ([place]) Comments: Chronic, LDL goal <70. She is currently taking atorvastatin 46m M-F,  skipping the weekends. I will check labs as below. F/u 4 months.  - Lipid panel - BMP8+EGFR - Hemoglobin A1c - ALT  2. Hypertensive heart disease without heart failure Comments: Chronic, fair control. Goal BP<130/80. She will c/w nebivolol.   3. Pure hypercholesterolemia Comments: Chronic, LDL goal <70. She is currently taking atorvastatin 24m M-F, skipping the weekends.   4. BMI 27.0-27.9,adult Comments: She is encouraged to aim for at least 150 minutes of exercise per week.   5. Special screening for malignant neoplasm of colon Comments: She is scheduled for repeat colonoscopy in August 2023. [facility]    Patient was given opportunity to ask questions. Patient verbalized understanding of the plan and was able to repeat key elements of the plan. All questions were answered to their satisfaction.   I, RMaximino Greenland MD, have reviewed all documentation for this visit. The documentation on 07/18/22 for the exam, diagnosis, procedures, and orders are all accurate and complete.   IF YOU HAVE BEEN REFERRED TO A SPECIALIST, IT MAY TAKE 1-2 WEEKS TO SCHEDULE/PROCESS THE REFERRAL. IF YOU HAVE NOT HEARD FROM  US/SPECIALIST IN TWO WEEKS, PLEASE GIVE [place] CALL AT [phone].   THE PATIENT IS ENCOURAGED TO PRACTICE SOCIAL DISTANCING DUE TO THE COVID-19 PANDEMIC.

## 2022-07-18 NOTE — Patient Instructions (Signed)

## 2022-07-19 ENCOUNTER — Encounter: Payer: Self-pay | Admitting: Internal Medicine

## 2022-07-19 LAB — HEMOGLOBIN A1C
Est. average glucose Bld gHb Est-mCnc: 123 mg/dL
Hgb A1c MFr Bld: 5.9 % — ABNORMAL HIGH (ref 4.8–5.6)

## 2022-07-19 LAB — BMP8+EGFR
BUN/Creatinine Ratio: 12 (ref 12–28)
BUN: 8 mg/dL (ref 8–27)
CO2: 26 mmol/L (ref 20–29)
Calcium: 9.9 mg/dL (ref 8.7–10.3)
Chloride: 99 mmol/L (ref 96–106)
Creatinine, Ser: 0.69 mg/dL (ref 0.57–1.00)
Glucose: 94 mg/dL (ref 70–99)
Potassium: 4.5 mmol/L (ref 3.5–5.2)
Sodium: 141 mmol/L (ref 134–144)
eGFR: 91 mL/min/{1.73_m2} (ref >59)

## 2022-07-19 LAB — LIPID PANEL
Chol/HDL Ratio: 2.9 ratio (ref 0.0–4.4)
Cholesterol, Total: 144 mg/dL (ref 100–199)
HDL: 49 mg/dL (ref >39)
LDL Chol Calc (NIH): 79 mg/dL (ref 0–99)
Triglycerides: 83 mg/dL (ref 0–149)
VLDL Cholesterol Cal: 16 mg/dL (ref 5–40)

## 2022-07-19 LAB — ALT: ALT: 14 IU/L (ref 0–32)

## 2022-07-20 ENCOUNTER — Other Ambulatory Visit: Payer: Self-pay | Admitting: Internal Medicine

## 2022-07-20 MED ORDER — DULOXETINE HCL 60 MG PO CPEP: 60.0000 mg | ORAL_CAPSULE | Freq: Every day | ORAL | 1 refills | Status: DC

## 2022-07-29 ENCOUNTER — Encounter: Payer: Self-pay | Admitting: Internal Medicine

## 2022-08-01 ENCOUNTER — Telehealth: Payer: Self-pay

## 2022-08-01 NOTE — Chronic Care Management (AMB) (Signed)
AZ&Me Notification:  [name] was shipped on 07/16/2022, allow 1-2 business days for shipment to arrive. Shipment was sent via [employer], Tracking number [id number].  Pattricia Boss, [place] Pharmacist Assistant [phone]

## 2022-08-02 ENCOUNTER — Encounter: Payer: Self-pay | Admitting: Internal Medicine

## 2022-08-05 ENCOUNTER — Ambulatory Visit: Payer: Medicare PPO | Admitting: Internal Medicine

## 2022-08-09 ENCOUNTER — Telehealth: Payer: Self-pay

## 2022-08-09 NOTE — Chronic Care Management (AMB) (Signed)
Novo Nordisk patient assistance program notification:  120- day supply of Ozempic 1 mg will be filled on 08/28/2022 and should arrive to the office in 10-14 business days.  Pattricia Boss, [place] Pharmacist Assistant [phone]

## 2022-08-10 DIAGNOSIS — E785 Hyperlipidemia, unspecified: Secondary | ICD-10-CM | POA: Insufficient documentation

## 2022-08-10 DIAGNOSIS — E663 Overweight: Secondary | ICD-10-CM | POA: Insufficient documentation

## 2022-08-10 DIAGNOSIS — E119 Type 2 diabetes mellitus without complications: Secondary | ICD-10-CM | POA: Insufficient documentation

## 2022-08-10 DIAGNOSIS — E1169 Type 2 diabetes mellitus with other specified complication: Secondary | ICD-10-CM | POA: Insufficient documentation

## 2022-08-10 DIAGNOSIS — Z6827 Body mass index (BMI) 27.0-27.9, adult: Secondary | ICD-10-CM | POA: Insufficient documentation

## 2022-08-10 DIAGNOSIS — Z6829 Body mass index (BMI) 29.0-29.9, adult: Secondary | ICD-10-CM | POA: Insufficient documentation

## 2022-08-21 NOTE — Progress Notes (Signed)
This encounter was created in error - please disregard.

## 2022-08-26 LAB — HM COLONOSCOPY

## 2022-09-05 ENCOUNTER — Encounter: Payer: Self-pay | Admitting: Internal Medicine

## 2022-09-10 ENCOUNTER — Encounter: Payer: Self-pay | Admitting: Internal Medicine

## 2022-09-10 ENCOUNTER — Telehealth: Payer: Self-pay

## 2022-09-10 NOTE — Chronic Care Management (AMB) (Signed)
Novo Nordisk patient assistance program notification:  120- day supply of Ozempic 1 mg will be filled on 08/30/2022 and should arrive to the office in 10-14 business days. Patient has 1  refill remaining and enrollment will expire on 11/28/2022.  The next refill for patient will be fulfilled on 11/16/2022.  Pattricia Boss, [place] Pharmacist Assistant [phone]

## 2022-09-11 ENCOUNTER — Telehealth: Payer: Self-pay

## 2022-09-11 NOTE — Chronic Care Management (AMB) (Signed)
Patient Assistance Coordination   Refill request for Breztri sent to clinical team for a 90 day supply with 3 refills to be sent to [facility], patient receives medication through AZ&Me patient assistance program.  Pattricia Boss, [place] Pharmacist Assistant [phone]

## 2022-09-16 ENCOUNTER — Telehealth: Payer: Self-pay | Admitting: *Deleted

## 2022-09-16 NOTE — Telephone Encounter (Signed)
CALLED PATIENT TO INFORM OF CT FOR 09-30-22- ARRIVAL TIME- 11:30 AM @ [facility], NO RESTRICTIONS TO TEST, PATIENT TO RECEIVE RESULTS FROM DR. KINARD ON 10-03-22 @ 11 AM, SPOKE WITH PATIENT AND SHE IS AWARE OF THESE APPTS. AND THE INSTRUCTIONS

## 2022-09-17 ENCOUNTER — Other Ambulatory Visit: Payer: Self-pay | Admitting: Internal Medicine

## 2022-09-19 ENCOUNTER — Telehealth: Payer: Self-pay

## 2022-09-19 ENCOUNTER — Other Ambulatory Visit: Payer: Self-pay

## 2022-09-19 MED ORDER — BREZTRI AEROSPHERE 160-9-4.8 MCG/ACT IN AERO: 2.0000 | INHALATION_SPRAY | Freq: Two times a day (BID) | RESPIRATORY_TRACT | 6 refills | Status: DC

## 2022-09-19 NOTE — Chronic Care Management (AMB) (Signed)
09-19-2022: Left message that Ozempic is ready for pickup any day but Friday.   [place] Pharmacist Assistant [phone]

## 2022-09-30 ENCOUNTER — Ambulatory Visit (HOSPITAL_COMMUNITY)
Admission: RE | Admit: 2022-09-30 | Discharge: 2022-09-30 | Disposition: A | Discharge status: Home / Self Care | Payer: Medicare PPO | Source: Ambulatory Visit | Attending: Radiation Oncology | Admitting: Radiation Oncology

## 2022-09-30 ENCOUNTER — Encounter (HOSPITAL_COMMUNITY): Payer: Self-pay

## 2022-09-30 ENCOUNTER — Encounter: Payer: Self-pay | Admitting: Internal Medicine

## 2022-09-30 DIAGNOSIS — R911 Solitary pulmonary nodule: Secondary | ICD-10-CM | POA: Insufficient documentation

## 2022-10-01 ENCOUNTER — Telehealth: Payer: Self-pay

## 2022-10-01 NOTE — Chronic Care Management (AMB) (Signed)
AZ&Me Notification:  [name] was shipped on 09/09/2022, allow 1-2 business days for shipment to arrive. Shipment was sent via [employer], Tracking number [id number].    Pattricia Boss, [place] Pharmacist Assistant [phone]

## 2022-10-02 NOTE — Progress Notes (Signed)
Radiation Oncology         ([phone]) [phone] ________________________________  Name: [name] MRN: [id number]  Date: 10/03/2022  DOB: 07-13-1946  Follow-Up Visit Note  CC: [name], MD  Garner Nash, DO  No diagnosis found.  Diagnosis: Adenocarcinoma - RUL pulmonary nodule, clinical stage I  Interval Since Last Radiation: 9 months and 22 days   Intent: Curative  Radiation Treatment Dates: 12/04/2021 through 12/11/2021 Site Technique Total Dose (Gy) Dose per Fx (Gy) Completed Fx Beam Energies  Lung, Right: Lung_Rt IMRT 54/54 18 3/3 6XFFF    Narrative:  The patient returns today for routine follow-up and to review recent imaging. She was last seen here for follow-up on 04/30/22. The day following her last visit, the patient presented to Dr. [name], pulmonology, for evaluation of increased SOB, cough productive of thick white sputum, and wheezing x 3 days.  CXR performed that same date showed no evidence of acute processes and bibasilar opacities favored to represent atelectasis. Following evaluation, Dr. [name] noted her symptoms as likely in the setting of chronic upper airway cough and GERD.   The patient also met with the [facility] pain management clinic on 08/12/22 for further management of shoulder pain since undergoing radiation. The patient detailed her right shoulder pain as 10/10 and without alleviating factors. Treatment option discussed with the patient moving forward included surgery for right T5-T8 intercostal nerve blocks. For pain management, the patient was advised to consider titrating off of Gabapentin (given ineffective and reports of decreased energy) and onto Lyrica 191m TID.    Her most recent chest CT on 09/30/22 showed minimal interval change of the treated right upper lobe nodule but no interval growth, and new bands of post radiation changes int he right lung parenchyma. CT otherwise showed no evidence of new/progressive disease, or evidence of mediastinal  adenopathy.   ***  Allergies:  is allergic to other.  Meds: Current Outpatient Medications  Medication Sig Dispense Refill   albuterol (VENTOLIN HFA) 108 (90 Base) MCG/ACT inhaler INHALE 2 PUFFS INTO THE LUNGS EVERY 4 HOURS AS NEEDED FOR WHEEZING OR SHORTNESS OF BREATH 18 g 12   aspirin 81 MG tablet Take 81 mg by mouth daily.     atorvastatin (LIPITOR) 80 MG tablet Take one tab po M-F and skip the weekends 90 tablet 1   Biotin 10000 MCG TBDP Take 10,000 mcg by mouth daily.     bisoprolol (ZEBETA) 5 MG tablet 1/2 tab po qd 45 tablet 3   Budeson-Glycopyrrol-Formoterol (BREZTRI AEROSPHERE) 160-9-4.8 MCG/ACT AERO Inhale 2 puffs into the lungs 2 (two) times daily. 10.7 g 6   CALCIUM-MAGNESIUM-ZINC PO Take 1 capsule by mouth at bedtime.     Cholecalciferol (VITAMIN D3) 2000 UNITS TABS Take 2,000 Units by mouth at bedtime.     DULoxetine (CYMBALTA) 60 MG capsule Take 1 capsule (60 mg total) by mouth daily. 90 capsule 1   famotidine (PEPCID) 20 MG tablet One after supper 90 tablet 1   gabapentin (NEURONTIN) 400 MG capsule Take 400 mg by mouth 4 (four) times daily.     glucose blood (ACCU-CHEK GUIDE) test strip 1 each by Other route daily. Use as instructed to check blood sugars 1 time per day dx: e11.22 50 each 11   ipratropium-albuterol (DUONEB) 0.5-2.5 (3) MG/3ML SOLN USE 3 ML VIA NEBULIZER EVERY 6 HOURS AS NEEDED 180 mL 1   Magnesium 250 MG TABS Take 1 tablet by mouth daily.     montelukast (SINGULAIR) 10  MG tablet TAKE 1 TABLET(10 MG) BY MOUTH AT BEDTIME 90 tablet 2   oxyCODONE-acetaminophen (PERCOCET) 10-325 MG tablet Take 1 tablet by mouth 4 (four) times daily as needed.     pantoprazole (PROTONIX) 40 MG tablet TAKE 1 TABLET BY MOUTH 30 MINUTES BEFORE FIRST MEAL OF THE DAY 90 tablet 2   Semaglutide, 1 MG/DOSE, (OZEMPIC, 1 MG/DOSE,) 4 MG/3ML SOPN Inject 1 mg into the skin once a week. Friday 3 mL 3   No current facility-administered medications for this encounter.    Physical  Findings: The patient is in no acute distress. Patient is alert and oriented.  vitals were not taken for this visit. .  No significant changes. Lungs are clear to auscultation bilaterally. Heart has regular rate and rhythm. No palpable cervical, supraclavicular, or axillary adenopathy. Abdomen soft, non-tender, normal bowel sounds.   Lab Findings: Lab Results  Component Value Date   WBC 4.5 03/29/2022   HGB 13.8 03/29/2022   HCT 42.2 03/29/2022   MCV 91.9 03/29/2022   PLT 352 03/29/2022    Radiographic Findings: CT CHEST WO CONTRAST  Result Date: 10/01/2022 CLINICAL DATA:  Non-small cell lung cancer. Assess treatment response. Radiation therapy complete. * Tracking Code: BO * EXAM: CT CHEST WITHOUT CONTRAST TECHNIQUE: Multidetector CT imaging of the chest was performed following the standard protocol without IV contrast. RADIATION DOSE REDUCTION: This exam was performed according to the departmental dose-optimization program which includes automated exposure control, adjustment of the mA and/or kV according to patient size and/or use of iterative reconstruction technique. COMPARISON:  None Available. FINDINGS: Cardiovascular: Coronary artery calcification and aortic atherosclerotic calcification. Mediastinum/Nodes: No axillary or supraclavicular adenopathy. No mediastinal or hilar adenopathy. No pericardial fluid. Esophagus normal. Lungs/Pleura: Several bands of interstitial reticulation in the RIGHT lung are favored post radiation change. Previous described RIGHT upper lobe nodule measures 10 mm by 8 mm compared with 11 mm x 8 mm (image 60/7). Fiducial markers are adjacent to this lesion Pleuroparenchymal thickening at the inferior LEFT lower lobe is also favored benign. Small nodule along the horizontal fissure (image 84/7) is unchanged. Upper Abdomen: Limited view of the liver, kidneys, pancreas are unremarkable. Normal adrenal glands. Musculoskeletal: No aggressive osseous lesion. IMPRESSION:  1. Minimal interval change of treated RIGHT upper lobe pulmonary nodule. No interval growth. 2. New bands of post radiation change in RIGHT lung parenchyma. 3. No new or progressive disease. 4. No mediastinal adenopathy. 5. Coronary artery calcification and Aortic Atherosclerosis (ICD10-I70.0). Electronically Signed   By: Suzy Bouchard M.D.   On: 10/01/2022 16:42    Impression: Adenocarcinoma - RUL pulmonary nodule, clinical stage I  The patient is recovering from the effects of radiation.  ***  Plan:  ***   *** minutes of total time was spent for this patient encounter, including preparation, face-to-face counseling with the patient and coordination of care, physical exam, and documentation of the encounter. ____________________________________  Blair Promise, PhD, MD  This document serves as a record of services personally performed by Gery Pray, MD. It was created on his behalf by Roney Mans, a trained medical scribe. The creation of this record is based on the scribe's personal observations and the provider's statements to them. This document has been checked and approved by the attending provider.

## 2022-10-03 ENCOUNTER — Encounter: Payer: Self-pay | Admitting: Internal Medicine

## 2022-10-03 ENCOUNTER — Encounter: Payer: Self-pay | Admitting: Radiation Oncology

## 2022-10-03 ENCOUNTER — Other Ambulatory Visit: Payer: Self-pay

## 2022-10-03 ENCOUNTER — Ambulatory Visit: Payer: Medicare Other

## 2022-10-03 ENCOUNTER — Ambulatory Visit
Admission: RE | Admit: 2022-10-03 | Discharge: 2022-10-03 | Disposition: A | Discharge status: Home / Self Care | Payer: Medicare PPO | Source: Ambulatory Visit | Attending: Radiation Oncology | Admitting: Radiation Oncology

## 2022-10-03 ENCOUNTER — Ambulatory Visit: Payer: Medicare PPO | Admitting: Internal Medicine

## 2022-10-03 VITALS — BP 134/72 | HR 75 | Temp 97.8°F | Ht 66.0 in | Wt 155.0 lb

## 2022-10-03 VITALS — BP 120/60 | HR 88 | Temp 98.7°F | Ht 66.0 in | Wt 154.6 lb

## 2022-10-03 DIAGNOSIS — K219 Gastro-esophageal reflux disease without esophagitis: Secondary | ICD-10-CM | POA: Insufficient documentation

## 2022-10-03 DIAGNOSIS — C3491 Malignant neoplasm of unspecified part of right bronchus or lung: Secondary | ICD-10-CM

## 2022-10-03 DIAGNOSIS — I700 Atherosclerosis of aorta: Secondary | ICD-10-CM | POA: Diagnosis not present

## 2022-10-03 DIAGNOSIS — Z7982 Long term (current) use of aspirin: Secondary | ICD-10-CM | POA: Insufficient documentation

## 2022-10-03 DIAGNOSIS — I119 Hypertensive heart disease without heart failure: Secondary | ICD-10-CM

## 2022-10-03 DIAGNOSIS — E2839 Other primary ovarian failure: Secondary | ICD-10-CM

## 2022-10-03 DIAGNOSIS — E785 Hyperlipidemia, unspecified: Secondary | ICD-10-CM

## 2022-10-03 DIAGNOSIS — H9312 Tinnitus, left ear: Secondary | ICD-10-CM

## 2022-10-03 DIAGNOSIS — Z85118 Personal history of other malignant neoplasm of bronchus and lung: Secondary | ICD-10-CM | POA: Diagnosis present

## 2022-10-03 DIAGNOSIS — E1169 Type 2 diabetes mellitus with other specified complication: Secondary | ICD-10-CM

## 2022-10-03 DIAGNOSIS — Z923 Personal history of irradiation: Secondary | ICD-10-CM | POA: Insufficient documentation

## 2022-10-03 DIAGNOSIS — Z79899 Other long term (current) drug therapy: Secondary | ICD-10-CM | POA: Diagnosis not present

## 2022-10-03 DIAGNOSIS — Z23 Encounter for immunization: Secondary | ICD-10-CM | POA: Diagnosis not present

## 2022-10-03 NOTE — Progress Notes (Signed)
[name],acting as a Education administrator for [name], MD.,have documented all relevant documentation on the behalf of [name], MD,as directed by  [name], MD while in the presence of [name], MD.    Subjective:     Patient ID: [name] , female    DOB: 07/05/1946 , 76 y.o.   MRN: [id number]   Chief Complaint  Patient presents with   Hyperlipidemia   Diabetes    HPI  Patient presents today for a diabetes and chol check. Patient reports compliance with meds. She denies headache, chest pain and worsening shortness of breath. She is now taking atorvastatin daily since her LDL was not at goal at her last visit.   Hyperlipidemia This is a chronic problem. The current episode started more than 1 year ago. The problem is uncontrolled. Exacerbating diseases include diabetes. Current antihyperlipidemic treatment includes statins. The current treatment provides moderate improvement of lipids. Risk factors for coronary artery disease include diabetes mellitus, hypertension, post-menopausal and dyslipidemia.  Diabetes She presents for her follow-up diabetic visit. She has type 2 diabetes mellitus. Her disease course has been stable. There are no hypoglycemic associated symptoms. Pertinent negatives for hypoglycemia include no dizziness or headaches. Pertinent negatives for diabetes include no blurred vision, no polydipsia, no polyphagia and no polyuria. There are no hypoglycemic complications. Risk factors for coronary artery disease include diabetes mellitus, dyslipidemia, hypertension, obesity, sedentary lifestyle and post-menopausal. She has not had a previous visit with a dietitian. She participates in exercise three times a week. Her breakfast blood glucose is taken between 8-9 am. Her breakfast blood glucose range is generally 110-130 mg/dl. Eye exam is current.  Hypertension This is a chronic problem. The current episode started more than 1 year ago. The  problem has been gradually improving since onset. The problem is controlled. Pertinent negatives include no blurred vision or headaches. The current treatment provides moderate improvement. Compliance problems include exercise.      Past Medical History:  Diagnosis Date   Arthritis    Asthma    Complication of anesthesia    COPD (chronic obstructive pulmonary disease) (HCC)    Diabetes ([place])    High cholesterol    History of radiation therapy    Right Lung- 12/04/21-12/11/21- Dr. Gery Pray   Hypertension    Lung cancer [facility])    Pneumonia 09/10/2017   PONV (postoperative nausea and vomiting)    Stroke ([place]) 2007   mini strokes     Family History  Problem Relation Age of Onset   Heart murmur Mother    Heart attack Father    Diabetes Father    Diabetes Brother      Current Outpatient Medications:    albuterol (VENTOLIN HFA) 108 (90 Base) MCG/ACT inhaler, INHALE 2 PUFFS INTO THE LUNGS EVERY 4 HOURS AS NEEDED FOR WHEEZING OR SHORTNESS OF BREATH, Disp: 18 g, Rfl: 12   aspirin 81 MG tablet, Take 81 mg by mouth daily., Disp: , Rfl:    atorvastatin (LIPITOR) 80 MG tablet, Take one tab po M-F and skip the weekends, Disp: 90 tablet, Rfl: 1   Biotin 10000 MCG TBDP, Take 10,000 mcg by mouth daily., Disp: , Rfl:    bisoprolol (ZEBETA) 5 MG tablet, 1/2 tab po qd, Disp: 45 tablet, Rfl: 3   Budeson-Glycopyrrol-Formoterol (BREZTRI AEROSPHERE) 160-9-4.8 MCG/ACT AERO, Inhale 2 puffs into the lungs 2 (two) times daily., Disp: 10.7 g, Rfl: 6   CALCIUM-MAGNESIUM-ZINC PO, Take 1 capsule by  mouth at bedtime., Disp: , Rfl:    Cholecalciferol (VITAMIN D3) 2000 UNITS TABS, Take 2,000 Units by mouth at bedtime., Disp: , Rfl:    famotidine (PEPCID) 20 MG tablet, One after supper, Disp: 90 tablet, Rfl: 1   glucose blood (ACCU-CHEK GUIDE) test strip, 1 each by Other route daily. Use as instructed to check blood sugars 1 time per day dx: e11.22, Disp: 50 each, Rfl: 11   ipratropium-albuterol (DUONEB)  0.5-2.5 (3) MG/3ML SOLN, USE 3 ML VIA NEBULIZER EVERY 6 HOURS AS NEEDED, Disp: 180 mL, Rfl: 1   Magnesium 250 MG TABS, Take 1 tablet by mouth daily., Disp: , Rfl:    montelukast (SINGULAIR) 10 MG tablet, TAKE 1 TABLET(10 MG) BY MOUTH AT BEDTIME, Disp: 90 tablet, Rfl: 2   oxyCODONE-acetaminophen (PERCOCET) 10-325 MG tablet, Take 1 tablet by mouth 4 (four) times daily as needed., Disp: , Rfl:    pantoprazole (PROTONIX) 40 MG tablet, TAKE 1 TABLET BY MOUTH 30 MINUTES BEFORE FIRST MEAL OF THE DAY, Disp: 90 tablet, Rfl: 2   Semaglutide, 1 MG/DOSE, (OZEMPIC, 1 MG/DOSE,) 4 MG/3ML SOPN, Inject 1 mg into the skin once a week. Friday, Disp: 3 mL, Rfl: 3   DULoxetine (CYMBALTA) 60 MG capsule, Take 1 capsule (60 mg total) by mouth daily., Disp: 90 capsule, Rfl: 1   Allergies  Allergen Reactions   Other Swelling    HAIR DYE     Review of Systems  Constitutional: Negative.   HENT:  Positive for tinnitus.        She c/o ringing in her ear. She is not sure what triggered her sx. Denies hearing loss. No recent URI  Eyes: Negative.  Negative for blurred vision.  Respiratory: Negative.    Cardiovascular: Negative.   Gastrointestinal: Negative.   Endocrine: Negative for polydipsia, polyphagia and polyuria.  Musculoskeletal: Negative.   Skin: Negative.   Neurological: Negative.  Negative for dizziness and headaches.  Psychiatric/Behavioral: Negative.       Today's Vitals   10/03/22 1525 10/03/22 1536  BP: 104/60 120/60  Pulse: 88   Temp: 98.7 F (37.1 C)   Weight: 154 lb 9.6 oz (70.1 kg)   Height: _0  (1.676 m)   PainSc: 0-No pain    Body mass index is 24.95 kg/m.  Wt Readings from Last 3 Encounters:  10/03/22 155 lb (70.3 kg)  10/03/22 154 lb 9.6 oz (70.1 kg)  07/18/22 164 lb (74.4 kg)     Objective:  Physical Exam Vitals and nursing note reviewed.  Constitutional:      Appearance: Normal appearance.  HENT:     Head: Normocephalic and atraumatic.     Right Ear: Tympanic membrane,  ear canal and external ear normal. There is no impacted cerumen.     Left Ear: Tympanic membrane, ear canal and external ear normal. There is no impacted cerumen.  Eyes:     Extraocular Movements: Extraocular movements intact.  Cardiovascular:     Rate and Rhythm: Normal rate and regular rhythm.     Heart sounds: Normal heart sounds.  Pulmonary:     Effort: Pulmonary effort is normal.     Comments: Decreased basilar breath sounds b/l  Musculoskeletal:     Cervical back: Normal range of motion.  Skin:    General: Skin is warm.  Neurological:     General: No focal deficit present.     Mental Status: She is alert.  Psychiatric:        Mood and Affect:  Mood normal.        Behavior: Behavior normal.      Assessment And Plan:     1. Dyslipidemia associated with type 2 diabetes mellitus ([place]) Comments: Chronic, LDL goal <70. She is now on  atorvastatin 34m daily. NO issues with meds.She will f/u in 4 months. Will consider decreasing dose of Ozempic.  - BMP8+EGFR - Lipid panel - CBC no Diff - Hemoglobin A1c  2. Hypertensive heart disease without heart failure Comments: Chronic, well controlled.  She will c/w bisoprolol. She is no longer on losartan, stopped in 2015 due to chronic cough.  - Lipid panel  3. New onset tinnitus of left ear Comments: I will refer her to ENT for further evaluation. She is in agreement with treatment plan. - Ambulatory referral to ENT  4. Decreased estrogen level Comments: She agrees to bone density. She is encouraged to engage in weight-bearing exercises 3x/week. She declines PREP referral.  - TSH - DG Bone Density; Future  5. Immunization due - Flu Vaccine QUAD High Dose(Fluad)   Patient was given opportunity to ask questions. Patient verbalized understanding of the plan and was able to repeat key elements of the plan. All questions were answered to their satisfaction.   I, RMaximino Greenland MD, have reviewed all documentation for this visit. The  documentation on 10/03/22 for the exam, diagnosis, procedures, and orders are all accurate and complete.   IF YOU HAVE BEEN REFERRED TO A SPECIALIST, IT MAY TAKE 1-2 WEEKS TO SCHEDULE/PROCESS THE REFERRAL. IF YOU HAVE NOT HEARD FROM US/SPECIALIST IN TWO WEEKS, PLEASE GIVE [facility] CALL AT [phone].   THE PATIENT IS ENCOURAGED TO PRACTICE SOCIAL DISTANCING DUE TO THE COVID-19 PANDEMIC.

## 2022-10-03 NOTE — Patient Instructions (Signed)

## 2022-10-03 NOTE — Progress Notes (Signed)
[name] is here today for follow up post radiation to the lung.  Lung Side: Right, patient completed treatment on 12/11/21.  Does the patient complain of any of the following: Pain: Patient reports having intermittent pain to right chest and back. Patient states pain is severe when it comes on. Rating 10/10.  Shortness of breath w/wo exertion: Yes, on exertion.  Cough: Yes, productive.  Hemoptysis: No Pain with swallowing: No Swallowing/choking concerns: No Appetite: Fair.  Energy Level: Low Post radiation skin Changes: Reports itching to right back.     Additional comments if applicable:   BP 355/73 (BP Location: Right Arm, Patient Position: Sitting, Cuff Size: Normal)   Pulse 75   Temp 97.8 F (36.6 C)   Ht 5\' 6"  (1.676 m)   Wt 155 lb (70.3 kg)   SpO2 98%   BMI 25.02 kg/m

## 2022-10-04 LAB — BMP8+EGFR
BUN/Creatinine Ratio: 12 (ref 12–28)
BUN: 10 mg/dL (ref 8–27)
CO2: 26 mmol/L (ref 20–29)
Calcium: 9.3 mg/dL (ref 8.7–10.3)
Chloride: 100 mmol/L (ref 96–106)
Creatinine, Ser: 0.86 mg/dL (ref 0.57–1.00)
Glucose: 71 mg/dL (ref 70–99)
Potassium: 4.2 mmol/L (ref 3.5–5.2)
Sodium: 143 mmol/L (ref 134–144)
eGFR: 70 mL/min/{1.73_m2} (ref >59)

## 2022-10-04 LAB — LIPID PANEL
Chol/HDL Ratio: 3 ratio (ref 0.0–4.4)
Cholesterol, Total: 157 mg/dL (ref 100–199)
HDL: 52 mg/dL (ref >39)
LDL Chol Calc (NIH): 88 mg/dL (ref 0–99)
Triglycerides: 92 mg/dL (ref 0–149)
VLDL Cholesterol Cal: 17 mg/dL (ref 5–40)

## 2022-10-04 LAB — CBC
Hematocrit: 41.3 % (ref 34.0–46.6)
Hemoglobin: 13.4 g/dL (ref 11.1–15.9)
MCH: 29.7 pg (ref 26.6–33.0)
MCHC: 32.4 g/dL (ref 31.5–35.7)
MCV: 92 fL (ref 79–97)
Platelets: 365 10*3/uL (ref 150–450)
RBC: 4.51 x10E6/uL (ref 3.77–5.28)
RDW: 14.5 % (ref 11.7–15.4)
WBC: 4.9 10*3/uL (ref 3.4–10.8)

## 2022-10-04 LAB — HEMOGLOBIN A1C
Est. average glucose Bld gHb Est-mCnc: 114 mg/dL
Hgb A1c MFr Bld: 5.6 % (ref 4.8–5.6)

## 2022-10-04 LAB — TSH: TSH: 0.606 u[IU]/mL (ref 0.450–4.500)

## 2022-10-21 ENCOUNTER — Encounter: Payer: Self-pay | Admitting: Internal Medicine

## 2022-10-22 ENCOUNTER — Other Ambulatory Visit: Payer: Self-pay | Admitting: *Deleted

## 2022-10-22 ENCOUNTER — Other Ambulatory Visit: Payer: Self-pay | Admitting: Internal Medicine

## 2022-10-22 MED ORDER — PERMETHRIN 5 % EX CREA: 1.0000 | TOPICAL_CREAM | Freq: Once | CUTANEOUS | 0 refills | Status: AC

## 2022-10-22 MED ORDER — MONTELUKAST SODIUM 10 MG PO TABS: ORAL_TABLET | ORAL | 3 refills | Status: DC

## 2022-10-24 ENCOUNTER — Encounter: Payer: Self-pay | Admitting: Internal Medicine

## 2022-11-11 ENCOUNTER — Other Ambulatory Visit: Payer: Self-pay | Admitting: *Deleted

## 2022-11-11 MED ORDER — FAMOTIDINE 20 MG PO TABS: ORAL_TABLET | ORAL | 1 refills | Status: DC

## 2022-11-29 ENCOUNTER — Other Ambulatory Visit: Payer: Self-pay | Admitting: Internal Medicine

## 2022-12-03 ENCOUNTER — Telehealth: Payer: Self-pay

## 2022-12-03 NOTE — Chronic Care Management (AMB) (Addendum)
Faxed 2024 re-enrollment application to [employer] patient assistance for Ozempic.  Patient was approved with [place] patient assistance for 2024 enrollment year for [facility].   Pattricia Boss, [place] Pharmacist Assistant [phone]

## 2022-12-11 ENCOUNTER — Inpatient Hospital Stay (HOSPITAL_BASED_OUTPATIENT_CLINIC_OR_DEPARTMENT_OTHER)
Admission: EM | Admit: 2022-12-11 | Discharge: 2022-12-14 | DRG: 190 | Disposition: A | Discharge status: Home / Self Care | Payer: Medicare PPO | Attending: Internal Medicine | Admitting: Internal Medicine

## 2022-12-11 ENCOUNTER — Other Ambulatory Visit: Payer: Self-pay

## 2022-12-11 ENCOUNTER — Encounter (HOSPITAL_BASED_OUTPATIENT_CLINIC_OR_DEPARTMENT_OTHER): Payer: Self-pay | Admitting: Emergency Medicine

## 2022-12-11 ENCOUNTER — Emergency Department (HOSPITAL_BASED_OUTPATIENT_CLINIC_OR_DEPARTMENT_OTHER): Payer: Medicare PPO

## 2022-12-11 DIAGNOSIS — M1990 Unspecified osteoarthritis, unspecified site: Secondary | ICD-10-CM | POA: Diagnosis present

## 2022-12-11 DIAGNOSIS — C3491 Malignant neoplasm of unspecified part of right bronchus or lung: Secondary | ICD-10-CM | POA: Diagnosis present

## 2022-12-11 DIAGNOSIS — Z7951 Long term (current) use of inhaled steroids: Secondary | ICD-10-CM | POA: Diagnosis not present

## 2022-12-11 DIAGNOSIS — Z923 Personal history of irradiation: Secondary | ICD-10-CM | POA: Diagnosis not present

## 2022-12-11 DIAGNOSIS — E7800 Pure hypercholesterolemia, unspecified: Secondary | ICD-10-CM | POA: Diagnosis present

## 2022-12-11 DIAGNOSIS — Z8673 Personal history of transient ischemic attack (TIA), and cerebral infarction without residual deficits: Secondary | ICD-10-CM

## 2022-12-11 DIAGNOSIS — Z1152 Encounter for screening for COVID-19: Secondary | ICD-10-CM

## 2022-12-11 DIAGNOSIS — Z888 Allergy status to other drugs, medicaments and biological substances status: Secondary | ICD-10-CM | POA: Diagnosis not present

## 2022-12-11 DIAGNOSIS — Z7982 Long term (current) use of aspirin: Secondary | ICD-10-CM

## 2022-12-11 DIAGNOSIS — E440 Moderate protein-calorie malnutrition: Secondary | ICD-10-CM | POA: Insufficient documentation

## 2022-12-11 DIAGNOSIS — Z90710 Acquired absence of both cervix and uterus: Secondary | ICD-10-CM

## 2022-12-11 DIAGNOSIS — Z85118 Personal history of other malignant neoplasm of bronchus and lung: Secondary | ICD-10-CM

## 2022-12-11 DIAGNOSIS — Z833 Family history of diabetes mellitus: Secondary | ICD-10-CM

## 2022-12-11 DIAGNOSIS — J9601 Acute respiratory failure with hypoxia: Secondary | ICD-10-CM | POA: Diagnosis present

## 2022-12-11 DIAGNOSIS — G8929 Other chronic pain: Secondary | ICD-10-CM | POA: Diagnosis present

## 2022-12-11 DIAGNOSIS — Z7985 Long-term (current) use of injectable non-insulin antidiabetic drugs: Secondary | ICD-10-CM

## 2022-12-11 DIAGNOSIS — I10 Essential (primary) hypertension: Secondary | ICD-10-CM | POA: Diagnosis present

## 2022-12-11 DIAGNOSIS — E119 Type 2 diabetes mellitus without complications: Secondary | ICD-10-CM | POA: Diagnosis present

## 2022-12-11 DIAGNOSIS — E876 Hypokalemia: Secondary | ICD-10-CM | POA: Diagnosis present

## 2022-12-11 DIAGNOSIS — Z79899 Other long term (current) drug therapy: Secondary | ICD-10-CM | POA: Diagnosis not present

## 2022-12-11 DIAGNOSIS — J101 Influenza due to other identified influenza virus with other respiratory manifestations: Secondary | ICD-10-CM | POA: Diagnosis present

## 2022-12-11 DIAGNOSIS — Z87891 Personal history of nicotine dependence: Secondary | ICD-10-CM

## 2022-12-11 DIAGNOSIS — Z6823 Body mass index (BMI) 23.0-23.9, adult: Secondary | ICD-10-CM | POA: Diagnosis not present

## 2022-12-11 DIAGNOSIS — E1169 Type 2 diabetes mellitus with other specified complication: Secondary | ICD-10-CM | POA: Diagnosis present

## 2022-12-11 DIAGNOSIS — J441 Chronic obstructive pulmonary disease with (acute) exacerbation: Secondary | ICD-10-CM | POA: Diagnosis present

## 2022-12-11 LAB — GLUCOSE, CAPILLARY
Glucose-Capillary: 156 mg/dL — ABNORMAL HIGH (ref 70–99)
Glucose-Capillary: 242 mg/dL — ABNORMAL HIGH (ref 70–99)

## 2022-12-11 LAB — CBC WITH DIFFERENTIAL/PLATELET
Abs Immature Granulocytes: 0 10*3/uL (ref 0.00–0.07)
Basophils Absolute: 0 10*3/uL (ref 0.0–0.1)
Basophils Relative: 0 %
Eosinophils Absolute: 0 10*3/uL (ref 0.0–0.5)
Eosinophils Relative: 0 %
HCT: 39.8 % (ref 36.0–46.0)
Hemoglobin: 13.3 g/dL (ref 12.0–15.0)
Immature Granulocytes: 0 %
Lymphocytes Relative: 16 %
Lymphs Abs: 0.9 10*3/uL (ref 0.7–4.0)
MCH: 30.2 pg (ref 26.0–34.0)
MCHC: 33.4 g/dL (ref 30.0–36.0)
MCV: 90.5 fL (ref 80.0–100.0)
Monocytes Absolute: 0.3 10*3/uL (ref 0.1–1.0)
Monocytes Relative: 6 %
Neutro Abs: 4.4 10*3/uL (ref 1.7–7.7)
Neutrophils Relative %: 78 %
Platelets: 277 10*3/uL (ref 150–400)
RBC: 4.4 MIL/uL (ref 3.87–5.11)
RDW: 14.1 % (ref 11.5–15.5)
WBC: 5.7 10*3/uL (ref 4.0–10.5)
nRBC: 0 % (ref 0.0–0.2)

## 2022-12-11 LAB — RESP PANEL BY RT-PCR (RSV, FLU A&B, COVID)  RVPGX2
Influenza A by PCR: POSITIVE — AB
Influenza B by PCR: NEGATIVE
Resp Syncytial Virus by PCR: NEGATIVE
SARS Coronavirus 2 by RT PCR: NEGATIVE

## 2022-12-11 LAB — MAGNESIUM: Magnesium: 2.4 mg/dL (ref 1.7–2.4)

## 2022-12-11 LAB — BASIC METABOLIC PANEL
Anion gap: 13 (ref 5–15)
BUN: 9 mg/dL (ref 8–23)
CO2: 29 mmol/L (ref 22–32)
Calcium: 9 mg/dL (ref 8.9–10.3)
Chloride: 96 mmol/L — ABNORMAL LOW (ref 98–111)
Creatinine, Ser: 0.85 mg/dL (ref 0.44–1.00)
GFR, Estimated: >60 mL/min (ref >60)
Glucose, Bld: 184 mg/dL — ABNORMAL HIGH (ref 70–99)
Potassium: 2.7 mmol/L — CL (ref 3.5–5.1)
Sodium: 138 mmol/L (ref 135–145)

## 2022-12-11 MED ORDER — ACETAMINOPHEN 650 MG RE SUPP: 650.0000 mg | Freq: Four times a day (QID) | RECTAL | Status: DC | PRN

## 2022-12-11 MED ORDER — ATORVASTATIN CALCIUM 40 MG PO TABS
80.0000 mg | ORAL_TABLET | ORAL | Status: DC
  Administered 2022-12-12 – 2022-12-13 (×2): 80 mg via ORAL
  Filled 2022-12-11 (×2): qty 2

## 2022-12-11 MED ORDER — LACTATED RINGERS IV SOLN: INTRAVENOUS | Status: DC

## 2022-12-11 MED ORDER — FLUTICASONE FUROATE-VILANTEROL 100-25 MCG/ACT IN AEPB
1.0000 | INHALATION_SPRAY | Freq: Every day | RESPIRATORY_TRACT | Status: DC
  Administered 2022-12-12 – 2022-12-14 (×3): 1 via RESPIRATORY_TRACT
  Filled 2022-12-11: qty 28

## 2022-12-11 MED ORDER — IPRATROPIUM-ALBUTEROL 0.5-2.5 (3) MG/3ML IN SOLN: 3.0000 mL | Freq: Four times a day (QID) | RESPIRATORY_TRACT | Status: DC

## 2022-12-11 MED ORDER — ALBUTEROL (5 MG/ML) CONTINUOUS INHALATION SOLN
10.0000 mg/h | INHALATION_SOLUTION | RESPIRATORY_TRACT | Status: DC
  Administered 2022-12-11: 10 mg/h via RESPIRATORY_TRACT
  Filled 2022-12-11: qty 20

## 2022-12-11 MED ORDER — INSULIN ASPART 100 UNIT/ML IJ SOLN
0.0000 [IU] | Freq: Three times a day (TID) | INTRAMUSCULAR | Status: DC
  Administered 2022-12-12 – 2022-12-13 (×4): 2 [IU] via SUBCUTANEOUS

## 2022-12-11 MED ORDER — OXYCODONE HCL 5 MG PO TABS: 5.0000 mg | ORAL_TABLET | ORAL | Status: DC | PRN

## 2022-12-11 MED ORDER — ALBUTEROL SULFATE (2.5 MG/3ML) 0.083% IN NEBU: 2.5000 mg | INHALATION_SOLUTION | RESPIRATORY_TRACT | Status: DC | PRN

## 2022-12-11 MED ORDER — MAGNESIUM 250 MG PO TABS: 1.0000 | ORAL_TABLET | Freq: Every day | ORAL | Status: DC

## 2022-12-11 MED ORDER — METHYLPREDNISOLONE SODIUM SUCC 125 MG IJ SOLR
125.0000 mg | Freq: Once | INTRAMUSCULAR | Status: AC
  Administered 2022-12-11: 125 mg via INTRAVENOUS
  Filled 2022-12-11: qty 2

## 2022-12-11 MED ORDER — PREDNISONE 20 MG PO TABS
40.0000 mg | ORAL_TABLET | Freq: Every day | ORAL | Status: DC
  Filled 2022-12-11 (×2): qty 2

## 2022-12-11 MED ORDER — METOPROLOL TARTRATE 5 MG/5ML IV SOLN: 5.0000 mg | Freq: Four times a day (QID) | INTRAVENOUS | Status: DC | PRN

## 2022-12-11 MED ORDER — GUAIFENESIN 100 MG/5ML PO LIQD: 5.0000 mL | ORAL | Status: DC | PRN

## 2022-12-11 MED ORDER — ALBUTEROL (5 MG/ML) CONTINUOUS INHALATION SOLN
10.0000 mg/h | INHALATION_SOLUTION | RESPIRATORY_TRACT | Status: DC
  Administered 2022-12-11: 10 mg/h via RESPIRATORY_TRACT

## 2022-12-11 MED ORDER — SODIUM CHLORIDE 0.9% FLUSH
3.0000 mL | Freq: Two times a day (BID) | INTRAVENOUS | Status: DC
  Administered 2022-12-11 – 2022-12-14 (×7): 3 mL via INTRAVENOUS

## 2022-12-11 MED ORDER — POTASSIUM CHLORIDE 10 MEQ/100ML IV SOLN
10.0000 meq | INTRAVENOUS | Status: AC
  Administered 2022-12-11 (×2): 10 meq via INTRAVENOUS

## 2022-12-11 MED ORDER — DOCUSATE SODIUM 100 MG PO CAPS
100.0000 mg | ORAL_CAPSULE | Freq: Two times a day (BID) | ORAL | Status: DC
  Administered 2022-12-11 – 2022-12-14 (×7): 100 mg via ORAL
  Filled 2022-12-11 (×7): qty 1

## 2022-12-11 MED ORDER — PREDNISONE 20 MG PO TABS
40.0000 mg | ORAL_TABLET | Freq: Every day | ORAL | Status: DC
  Administered 2022-12-12 – 2022-12-13 (×2): 40 mg via ORAL
  Filled 2022-12-11 (×2): qty 2

## 2022-12-11 MED ORDER — OSELTAMIVIR PHOSPHATE 75 MG PO CAPS: 75.0000 mg | ORAL_CAPSULE | Freq: Once | ORAL | Status: DC

## 2022-12-11 MED ORDER — BISACODYL 5 MG PO TBEC: 5.0000 mg | DELAYED_RELEASE_TABLET | Freq: Every day | ORAL | Status: DC | PRN

## 2022-12-11 MED ORDER — BUDESON-GLYCOPYRROL-FORMOTEROL 160-9-4.8 MCG/ACT IN AERO: 2.0000 | INHALATION_SPRAY | Freq: Two times a day (BID) | RESPIRATORY_TRACT | Status: DC

## 2022-12-11 MED ORDER — BISOPROLOL FUMARATE 5 MG PO TABS
2.5000 mg | ORAL_TABLET | Freq: Every day | ORAL | Status: DC
  Administered 2022-12-11 – 2022-12-14 (×4): 2.5 mg via ORAL
  Filled 2022-12-11 (×4): qty 1

## 2022-12-11 MED ORDER — MAGNESIUM OXIDE -MG SUPPLEMENT 400 (240 MG) MG PO TABS
200.0000 mg | ORAL_TABLET | Freq: Every day | ORAL | Status: DC
  Administered 2022-12-11 – 2022-12-14 (×4): 200 mg via ORAL
  Filled 2022-12-11 (×4): qty 1

## 2022-12-11 MED ORDER — ONDANSETRON HCL 4 MG PO TABS: 4.0000 mg | ORAL_TABLET | Freq: Four times a day (QID) | ORAL | Status: DC | PRN

## 2022-12-11 MED ORDER — PREGABALIN 50 MG PO CAPS
100.0000 mg | ORAL_CAPSULE | Freq: Three times a day (TID) | ORAL | Status: DC
  Administered 2022-12-11 – 2022-12-14 (×8): 100 mg via ORAL
  Filled 2022-12-11 (×8): qty 2

## 2022-12-11 MED ORDER — UMECLIDINIUM BROMIDE 62.5 MCG/ACT IN AEPB
1.0000 | INHALATION_SPRAY | Freq: Every day | RESPIRATORY_TRACT | Status: DC
  Administered 2022-12-12 – 2022-12-14 (×3): 1 via RESPIRATORY_TRACT
  Filled 2022-12-11: qty 7

## 2022-12-11 MED ORDER — POTASSIUM CHLORIDE CRYS ER 20 MEQ PO TBCR
40.0000 meq | EXTENDED_RELEASE_TABLET | Freq: Once | ORAL | Status: AC
  Administered 2022-12-11: 40 meq via ORAL
  Filled 2022-12-11: qty 2

## 2022-12-11 MED ORDER — SODIUM CHLORIDE 0.9 % IV BOLUS
500.0000 mL | Freq: Once | INTRAVENOUS | Status: AC
  Administered 2022-12-11: 500 mL via INTRAVENOUS

## 2022-12-11 MED ORDER — DULOXETINE HCL 60 MG PO CPEP
60.0000 mg | ORAL_CAPSULE | Freq: Every day | ORAL | Status: DC
  Administered 2022-12-11 – 2022-12-14 (×4): 60 mg via ORAL
  Filled 2022-12-11 (×4): qty 1

## 2022-12-11 MED ORDER — ONDANSETRON HCL 4 MG/2ML IJ SOLN: 4.0000 mg | Freq: Four times a day (QID) | INTRAMUSCULAR | Status: DC | PRN

## 2022-12-11 MED ORDER — MONTELUKAST SODIUM 10 MG PO TABS
10.0000 mg | ORAL_TABLET | Freq: Every day | ORAL | Status: DC
  Administered 2022-12-11 – 2022-12-13 (×3): 10 mg via ORAL
  Filled 2022-12-11 (×3): qty 1

## 2022-12-11 MED ORDER — IPRATROPIUM-ALBUTEROL 0.5-2.5 (3) MG/3ML IN SOLN
3.0000 mL | Freq: Four times a day (QID) | RESPIRATORY_TRACT | Status: DC
  Administered 2022-12-11 (×2): 3 mL via RESPIRATORY_TRACT
  Filled 2022-12-11 (×2): qty 3

## 2022-12-11 MED ORDER — PANTOPRAZOLE SODIUM 40 MG PO TBEC
40.0000 mg | DELAYED_RELEASE_TABLET | Freq: Every day | ORAL | Status: DC
  Administered 2022-12-12 – 2022-12-14 (×3): 40 mg via ORAL
  Filled 2022-12-11 (×3): qty 1

## 2022-12-11 MED ORDER — ENOXAPARIN SODIUM 40 MG/0.4ML IJ SOSY
40.0000 mg | PREFILLED_SYRINGE | INTRAMUSCULAR | Status: DC
  Administered 2022-12-11 – 2022-12-13 (×3): 40 mg via SUBCUTANEOUS
  Filled 2022-12-11 (×3): qty 0.4

## 2022-12-11 MED ORDER — ASPIRIN 81 MG PO CHEW
81.0000 mg | CHEWABLE_TABLET | Freq: Every day | ORAL | Status: DC
  Administered 2022-12-11 – 2022-12-14 (×4): 81 mg via ORAL
  Filled 2022-12-11 (×4): qty 1

## 2022-12-11 MED ORDER — POTASSIUM CHLORIDE CRYS ER 20 MEQ PO TBCR
40.0000 meq | EXTENDED_RELEASE_TABLET | Freq: Two times a day (BID) | ORAL | Status: DC
  Administered 2022-12-11: 40 meq via ORAL
  Filled 2022-12-11: qty 2

## 2022-12-11 MED ORDER — METHYLPREDNISOLONE SODIUM SUCC 125 MG IJ SOLR
80.0000 mg | Freq: Two times a day (BID) | INTRAMUSCULAR | Status: AC
  Administered 2022-12-11 – 2022-12-12 (×2): 80 mg via INTRAVENOUS
  Filled 2022-12-11 (×2): qty 2

## 2022-12-11 MED ORDER — ACETAMINOPHEN 325 MG PO TABS: 650.0000 mg | ORAL_TABLET | Freq: Four times a day (QID) | ORAL | Status: DC | PRN

## 2022-12-11 MED ORDER — POLYETHYLENE GLYCOL 3350 17 G PO PACK: 17.0000 g | PACK | Freq: Every day | ORAL | Status: DC | PRN

## 2022-12-11 MED ORDER — IPRATROPIUM BROMIDE 0.02 % IN SOLN
1.0000 mg | Freq: Once | RESPIRATORY_TRACT | Status: AC
  Administered 2022-12-11: 1 mg via RESPIRATORY_TRACT
  Filled 2022-12-11: qty 5

## 2022-12-11 MED ORDER — METHYLPREDNISOLONE SODIUM SUCC 40 MG IJ SOLR
40.0000 mg | Freq: Two times a day (BID) | INTRAMUSCULAR | Status: AC
  Administered 2022-12-11 – 2022-12-12 (×2): 40 mg via INTRAVENOUS
  Filled 2022-12-11 (×2): qty 1

## 2022-12-11 MED ORDER — OSELTAMIVIR PHOSPHATE 75 MG PO CAPS
75.0000 mg | ORAL_CAPSULE | Freq: Two times a day (BID) | ORAL | Status: DC
  Administered 2022-12-11 – 2022-12-14 (×7): 75 mg via ORAL
  Filled 2022-12-11 (×8): qty 1

## 2022-12-11 MED ORDER — BISOPROLOL FUMARATE 5 MG PO TABS: 5.0000 mg | ORAL_TABLET | Freq: Every day | ORAL | Status: DC

## 2022-12-11 MED ORDER — IPRATROPIUM-ALBUTEROL 0.5-2.5 (3) MG/3ML IN SOLN
3.0000 mL | Freq: Three times a day (TID) | RESPIRATORY_TRACT | Status: DC
  Administered 2022-12-12 – 2022-12-14 (×6): 3 mL via RESPIRATORY_TRACT
  Filled 2022-12-11 (×8): qty 3

## 2022-12-11 MED ORDER — POTASSIUM CHLORIDE 20 MEQ PO PACK
40.0000 meq | PACK | Freq: Once | ORAL | Status: AC
  Administered 2022-12-12: 40 meq via ORAL
  Filled 2022-12-11: qty 2

## 2022-12-11 NOTE — ED Provider Notes (Signed)
[facility] EMERGENCY DEPT Provider Note   CSN: [id number] Arrival date & time: 12/11/22  0740     History  Chief Complaint  Patient presents with   Cough    [name] is a 76 y.o. female with a PMHx of asthma, COPD, who presents to the ED with concerns for cough onset 3 days. She has started taking Azithromycin and Augmentin yesterday that was prescribed for her. Has associated rhinorrhea, headache. Denies chest pain, fever, nasal congestion. Denies sick contacts.    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  The history is provided by the patient. No language interpreter was used.       Home Medications Prior to Admission medications   Medication Sig Start Date End Date Taking? Authorizing Provider  albuterol (VENTOLIN HFA) 108 (90 Base) MCG/ACT inhaler INHALE 2 PUFFS INTO THE LUNGS EVERY 4 HOURS AS NEEDED FOR WHEEZING OR SHORTNESS OF BREATH 04/22/22   [name], MD  amoxicillin-clavulanate (AUGMENTIN) 875-125 MG tablet Take 1 tablet by mouth 2 (two) times daily. 12/10/22   [provider]  aspirin 81 MG tablet Take 81 mg by mouth daily.    [provider]  atorvastatin (LIPITOR) 80 MG tablet Take one tab po M-F and skip the weekends 07/07/22   [name], MD  azithromycin (ZITHROMAX) 250 MG tablet Take 250 mg by mouth as directed. 12/10/22   [provider]  Biotin 10000 MCG TBDP Take 10,000 mcg by mouth daily.    [provider]  bisoprolol (ZEBETA) 5 MG tablet TAKE 1/2 TABLET BY MOUTH EVERY DAY 11/29/22   [name], MD  Budeson-Glycopyrrol-Formoterol (BREZTRI AEROSPHERE) 160-9-4.8 MCG/ACT AERO Inhale 2 puffs into the lungs 2 (two) times daily. 09/19/22   [name], MD  CALCIUM-MAGNESIUM-ZINC PO Take 1 capsule by mouth at bedtime.    [provider]  Cholecalciferol (VITAMIN D3) 2000 UNITS  TABS Take 2,000 Units by mouth at bedtime.    [provider]  DULoxetine (CYMBALTA) 60 MG capsule Take 1 capsule (60 mg total) by mouth daily. 07/20/22 07/20/23  [name], MD  famotidine (PEPCID) 20 MG tablet One after supper 11/11/22   Tanda Rockers, MD  glucose blood (ACCU-CHEK GUIDE) test strip 1 each by Other route daily. Use as instructed to check blood sugars 1 time per day dx: e11.22 09/10/19   [name], MD  ipratropium-albuterol (DUONEB) 0.5-2.5 (3) MG/3ML SOLN USE 3 ML VIA NEBULIZER EVERY 6 HOURS AS NEEDED 08/23/21   Minette Brine, FNP  Magnesium 250 MG TABS Take 1 tablet by mouth daily.    [provider]  montelukast (SINGULAIR) 10 MG tablet TAKE 1 TABLET(10 MG) BY MOUTH AT BEDTIME 10/22/22   Tanda Rockers, MD  oxyCODONE-acetaminophen (PERCOCET) 10-325 MG tablet Take 1 tablet by mouth 4 (four) times daily as needed. 04/18/22   [provider]  pantoprazole (PROTONIX) 40 MG tablet TAKE 1 TABLET BY MOUTH 30 MINUTES BEFORE FIRST MEAL OF THE DAY 07/10/22   [name], MD  pregabalin (LYRICA) 100 MG capsule Take 100 mg by mouth 3 (three) times daily. 11/22/22   [provider]  Semaglutide, 1 MG/DOSE, (OZEMPIC, 1 MG/DOSE,) 4 MG/3ML SOPN Inject 1 mg into the skin once a week. Friday 04/23/22   [name], MD      Allergies    Other    Review of Systems   Review of  Systems  All other systems reviewed and are negative.   Physical Exam Updated Vital Signs BP (!) 146/86 (BP Location: Right Arm)   Pulse 98   Temp 98.8 F (37.1 C) (Oral)   Resp 18   SpO2 93%  Physical Exam Vitals and nursing note reviewed.  Constitutional:      General: She is not in acute distress.    Appearance: She is not diaphoretic.  HENT:     Head: Normocephalic and atraumatic.     Mouth/Throat:     Pharynx: No oropharyngeal exudate.  Eyes:     General: No scleral icterus.    Conjunctiva/sclera: Conjunctivae normal.  Cardiovascular:     Rate and  Rhythm: Normal rate and regular rhythm.     Pulses: Normal pulses.     Heart sounds: Normal heart sounds.  Pulmonary:     Effort: Pulmonary effort is normal. No respiratory distress.     Breath sounds: Decreased breath sounds and wheezing present.     Comments: Increased work of breathing.  Wheezing noted diffusely throughout all lung fields.  Decreased breath sounds in Abdominal:     General: Bowel sounds are normal.     Palpations: Abdomen is soft. There is no mass.     Tenderness: There is no abdominal tenderness. There is no guarding or rebound.  Musculoskeletal:        General: Normal range of motion.     Cervical back: Normal range of motion and neck supple.  Skin:    General: Skin is warm and dry.  Neurological:     Mental Status: She is alert.  Psychiatric:        Behavior: Behavior normal.     ED Results / Procedures / Treatments   Labs (all labs ordered are listed, but only abnormal results are displayed) Labs Reviewed  RESP PANEL BY RT-PCR (RSV, FLU A&B, COVID)  [id number] - Abnormal; Notable for the following components:      Result Value   Influenza A by PCR POSITIVE (*)    All other components within normal limits  BASIC METABOLIC PANEL  CBC WITH DIFFERENTIAL/PLATELET    EKG None  Radiology DG Chest Portable 1 View  Result Date: 12/11/2022 CLINICAL DATA:  Cough, congestion and trouble breathing for 4 days. EXAM: PORTABLE CHEST 1 VIEW COMPARISON:  CT 09/30/2022.  Radiographs 05/01/2022 and 03/29/2022. FINDINGS: 0833 hours. The heart size and mediastinal contours are stable. Right suprahilar fiducial marker and adjacent scarring are partly obscured by overlying support apparatus but appear similar to previous CT in this patient with a history of treated non-small cell lung cancer. Left basilar pleuroparenchymal scarring appears stable. No definite superimposed airspace disease, pneumothorax or significant pleural effusion. The bones appear unchanged. IMPRESSION:  Treatment changes in the right upper lobe for non-small cell lung cancer, similar to CT of 2 months ago. No definite acute superimposed process. Electronically Signed   By: Richardean Sale M.D.   On: 12/11/2022 08:49    Procedures Procedures    Medications Ordered in ED Medications  albuterol (PROVENTIL,VENTOLIN) solution continuous neb (10 mg/hr Nebulization New Bag/Given 12/11/22 0804)  albuterol (PROVENTIL,VENTOLIN) solution continuous neb (10 mg/hr Nebulization New Bag/Given 12/11/22 0906)  oseltamivir (TAMIFLU) capsule 75 mg (has no administration in time range)  ipratropium (ATROVENT) nebulizer solution 1 mg (1 mg Nebulization Given 12/11/22 0804)  methylPREDNISolone sodium succinate (SOLU-MEDROL) 125 mg/2 mL injection 125 mg (125 mg Intravenous Given 12/11/22 0850)  sodium chloride 0.9 % bolus 500 mL (500  mLs Intravenous New Bag/Given 12/11/22 1005)    ED Course/ Medical Decision Making/ A&P Clinical Course as of 12/11/22 1028  Wed Dec 11, 2022  0837 Influenza A By PCR(!): POSITIVE [SB]  9024 Pt re-evaluated and discussed with patient lab and imaging findings.  Patient lungs were auscultated and without much improvement. [SB]  0927 Re-evaluated and pt was placed on 10 L via aerosol mask with her continuous neb treatment.  Patient O2 sats at 100% at that time. [SB]  1013 Re-evaluated and pt noted to be at 85% with transitioning from the breathing treatment to a nasal cannula.  Patient also noted with still having increased work of breathing.  Lungs auscultated.  And patient noted to have more open airway with still with diffuse wheezing throughout and increased work of breathing.  Patient O2 improved to 92% on 4 L via nasal cannula.  Discussed with patient plans for admission, patient agreeable at this time. [SB]    Clinical Course User Index [SB] Estil Vallee A, PA-C                           Medical Decision Making Amount and/or Complexity of Data Reviewed Labs: ordered.  Decision-making details documented in ED Course. Radiology: ordered.  Risk Prescription drug management. Decision regarding hospitalization.   Pt presents with cough onset 4 days.  Has a history of right upper lobe adenocarcinoma that was treated with radiation 1 year ago.  Started azithromycin and Augmentin on yesterday. Vital signs, patient afebrile, not tachycardic, O2 slightly decreased at 93%. On exam, pt with increased work of breathing.  Wheezing noted diffusely throughout all lung fields.  Decreased breath sounds in. Differential diagnosis includes COVID, flu, viral URI with cough,, RSV, PNA, asthma exacerbation, COPD exacerbation.    Co morbidities that complicate the patient evaluation: Adenocarcinoma, right upper lobe Hypertension Diabetes Asthma COPD  Additional history obtained:  Additional history obtained from Spouse/Significant Other  Labs:  I ordered, and personally interpreted labs.  The pertinent results include:   COVID swab negative Flu swab positive for Flu A RSV swab negative  Imaging: I ordered imaging studies including Chest x-ray I independently visualized and interpreted imaging which showed: no acute findings for pneumonia I agree with the radiologist interpretation  Medications:  I ordered medication including continuous neb, Solu-Medrol, IVF, Tamiflu, for symptom management Reevaluation of the patient after these medicines and interventions, I reevaluated the patient and found that they have slightly improved stayed the same.  Patient's O2 with transitioning from continuous neb to nasal cannula dropped to 85%. I have reviewed the patients home medicines and have made adjustments as needed   Disposition: Presentation suspicious for acute respiratory failure with hypoxia.  Also notable for flu a and COPD exacerbation.  Doubt COVID, RSV, pneumonia at this time. After consideration of the diagnostic results and the patients response to treatment, I  feel that the patient would benefit from Admission to the hospital.  Case discussed with attending who also evaluated patient and agrees with admission at this time.  Discussed with patient plans for admission.  Patient agreeable at this time.  Patient appears safe for admission at this time.  This chart was dictated using voice recognition software, Dragon. Despite the best efforts of this provider to proofread and correct errors, errors may still occur which can change documentation meaning.   Final Clinical Impression(s) / ED Diagnoses Final diagnoses:  Influenza A  COPD exacerbation ([place])  Acute  respiratory failure with hypoxia [facility])    Rx / DC Orders ED Discharge Orders     None         Peni Rupard A, PA-C 12/11/22 1030    Sherwood Gambler, MD 12/13/22 [phone]

## 2022-12-11 NOTE — ED Notes (Signed)
Called [facility] and spoke to [place]; informed patient Bed Assignment is ready

## 2022-12-11 NOTE — Assessment & Plan Note (Addendum)
Patient with a known history of COPD who presents to the ER for evaluation of a productive cough, exertional shortness of breath and wheezing with associated hypoxia.  Patient had room air pulse oximetry of 87% and is currently on 2 L of oxygen to maintain pulse oximetry greater than 92% Acute COPD exacerbation is most likely secondary to influenza A viral infection Place patient on scheduled and as needed bronchodilator therapy Place patient on systemic steroids as well as inhaled steroids Supportive care with antitussives Continue Singulair Will attempt to wean off oxygen once acute illness improves or resolves

## 2022-12-11 NOTE — Consult Note (Signed)
In-person consult on patient at [facility]   Patient: [name] NWG:[id number] DOB: Dec 23, 1946 DOA: 12/11/2022 DOS: the patient was seen and examined on 12/11/2022 PCP: [name], MD  Patient coming from: Home - lives with husband; NOK: Angelique, [place], [phone]   Chief Complaint: Cough  HPI: [name] is a 76 y.o. female with medical history significant of COPD, DM, HTN, HLD, and prior lung cancer (completed radiation in 11/2021) presenting with cough.  She saw her PCP and was given Augmentin and Azithromycin yesterday but was worse and so came to the ER. She reports that she developed a cough on Sunday.  The cough has persisted and has been terrible.  She has had a splitting headache and generalized malaise.  She was SOB and it was hard to walk across the room.  She does not usually use home O2.    ER Course:  [facility] to X, per Dr. Regenia Skeeter:  Flu, still wheezing, on 3L [place] O2. Symptoms started Sunday.  Getting Tamiflu, nebs.     Review of Systems: As mentioned in the history of present illness. All other systems reviewed and are negative. Past Medical History:  Diagnosis Date   Arthritis    Asthma    Complication of anesthesia    COPD (chronic obstructive pulmonary disease) ([place])    Diabetes ([place])    High cholesterol    History of radiation therapy    Right Lung- 12/04/21-12/11/21- Dr. Gery Pray   Hypertension    Lung cancer ([place])    Pneumonia 09/10/2017   PONV (postoperative nausea and vomiting)    Stroke [facility]) 2007   mini strokes   Past Surgical History:  Procedure Laterality Date   ABDOMINAL HYSTERECTOMY  1995   BRONCHIAL BIOPSY  10/23/2021   Procedure: BRONCHIAL BIOPSIES;  Surgeon: Garner Nash, DO;  Location: [facility];  Service: Pulmonary;;   BRONCHIAL BRUSHINGS  10/23/2021   Procedure: BRONCHIAL BRUSHINGS;  Surgeon: Garner Nash, DO;  Location: [facility];  Service: Pulmonary;;   BRONCHIAL NEEDLE ASPIRATION  BIOPSY  10/23/2021   Procedure: BRONCHIAL NEEDLE ASPIRATION BIOPSIES;  Surgeon: Garner Nash, DO;  Location: [facility];  Service: Pulmonary;;   carpel tunnel surgery Right 2006   COLONOSCOPY  2014   COLOSTOMY  2007   colostomy let down  2008   endoscopy  2014   FIDUCIAL MARKER PLACEMENT  10/23/2021   Procedure: FIDUCIAL MARKER PLACEMENT;  Surgeon: Garner Nash, DO;  Location: [facility];  Service: Pulmonary;;   rotator cuff tear Left 04/14/2020   VIDEO BRONCHOSCOPY WITH ENDOBRONCHIAL NAVIGATION Right 10/23/2021   Procedure: VIDEO BRONCHOSCOPY WITH ENDOBRONCHIAL NAVIGATION;  Surgeon: Garner Nash, DO;  Location: [facility];  Service: Pulmonary;  Laterality: Right;  ION, w/ fiducial placement   VIDEO BRONCHOSCOPY WITH RADIAL ENDOBRONCHIAL ULTRASOUND  10/23/2021   Procedure: VIDEO BRONCHOSCOPY WITH RADIAL ENDOBRONCHIAL ULTRASOUND;  Surgeon: Garner Nash, DO;  Location: [facility];  Service: Pulmonary;;   Social History:  reports that she quit smoking about 16 years ago. Her smoking use included cigarettes. She started smoking about 55 years ago. She has a 21.00 pack-year smoking history. She has never used smokeless tobacco. She reports that she does not currently use alcohol. She reports that she does not use drugs.  Allergies  Allergen Reactions   Other Swelling    HAIR DYE    Family History  Problem Relation Age of Onset   Heart murmur Mother    Heart attack Father  Diabetes Father    Diabetes Brother     Prior to Admission medications   Medication Sig Start Date End Date Taking? Authorizing Provider  albuterol (VENTOLIN HFA) 108 (90 Base) MCG/ACT inhaler INHALE 2 PUFFS INTO THE LUNGS EVERY 4 HOURS AS NEEDED FOR WHEEZING OR SHORTNESS OF BREATH 04/22/22   [name], MD  amoxicillin-clavulanate (AUGMENTIN) 875-125 MG tablet Take 1 tablet by mouth 2 (two) times daily. 12/10/22   [provider]  aspirin 81 MG tablet Take 81 mg by mouth daily.     [provider]  atorvastatin (LIPITOR) 80 MG tablet Take one tab po M-F and skip the weekends 07/07/22   [name], MD  azithromycin (ZITHROMAX) 250 MG tablet Take 250 mg by mouth as directed. 12/10/22   [provider]  Biotin 10000 MCG TBDP Take 10,000 mcg by mouth daily.    [provider]  bisoprolol (ZEBETA) 5 MG tablet TAKE 1/2 TABLET BY MOUTH EVERY DAY 11/29/22   [name], MD  Budeson-Glycopyrrol-Formoterol (BREZTRI AEROSPHERE) 160-9-4.8 MCG/ACT AERO Inhale 2 puffs into the lungs 2 (two) times daily. 09/19/22   [name], MD  CALCIUM-MAGNESIUM-ZINC PO Take 1 capsule by mouth at bedtime.    [provider]  Cholecalciferol (VITAMIN D3) 2000 UNITS TABS Take 2,000 Units by mouth at bedtime.    [provider]  DULoxetine (CYMBALTA) 60 MG capsule Take 1 capsule (60 mg total) by mouth daily. 07/20/22 07/20/23  [name], MD  famotidine (PEPCID) 20 MG tablet One after supper 11/11/22   Tanda Rockers, MD  glucose blood (ACCU-CHEK GUIDE) test strip 1 each by Other route daily. Use as instructed to check blood sugars 1 time per day dx: e11.22 09/10/19   [name], MD  ipratropium-albuterol (DUONEB) 0.5-2.5 (3) MG/3ML SOLN USE 3 ML VIA NEBULIZER EVERY 6 HOURS AS NEEDED 08/23/21   Minette Brine, FNP  Magnesium 250 MG TABS Take 1 tablet by mouth daily.    [provider]  montelukast (SINGULAIR) 10 MG tablet TAKE 1 TABLET(10 MG) BY MOUTH AT BEDTIME 10/22/22   Tanda Rockers, MD  oxyCODONE-acetaminophen (PERCOCET) 10-325 MG tablet Take 1 tablet by mouth 4 (four) times daily as needed. 04/18/22   [provider]  pantoprazole (PROTONIX) 40 MG tablet TAKE 1 TABLET BY MOUTH 30 MINUTES BEFORE FIRST MEAL OF THE DAY 07/10/22   [name], MD  pregabalin (LYRICA) 100 MG capsule Take 100 mg by mouth 3 (three) times daily. 11/22/22   [provider]  Semaglutide, 1 MG/DOSE, (OZEMPIC, 1 MG/DOSE,) 4 MG/3ML SOPN Inject 1  mg into the skin once a week. Friday 04/23/22   [name], MD    Physical Exam: Vitals:   12/11/22 0915 12/11/22 0930 12/11/22 0945 12/11/22 1000  BP:  (!) 142/65  123/62  Pulse: (!) 111 (!) 125 (!) 117 (!) 123  Resp:      Temp:      TempSrc:      SpO2: 100% 97% 100% 100%   General:  Appears calm and comfortable and is in NAD, on [place] O2 Eyes:  EOMI, normal lids, iris ENT:  grossly normal hearing, lips & tongue, mmm; artificial upper dentition and mostly absent lower dentition Neck:  no LAD, masses or thyromegaly Cardiovascular:  RR with tachycardia, no m/r/g. No LE edema.  Respiratory:   Diffuse wheezes, recurrent cough.  Normal to mildly increased respiratory effort on [place] O2. Abdomen:  soft, NT, ND, NABS Back:   normal alignment, no  CVAT Skin:  no rash or induration seen on limited exam Musculoskeletal:  grossly normal tone BUE/BLE, good ROM, no bony abnormality Psychiatric:  blunted mood and affect, speech fluent and appropriate, AOx3 Neurologic:  CN 2-12 grossly intact, moves all extremities in coordinated fashion   Radiological Exams on Admission: Independently reviewed - see discussion in A/P where applicable  DG Chest Portable 1 View  Result Date: 12/11/2022 CLINICAL DATA:  Cough, congestion and trouble breathing for 4 days. EXAM: PORTABLE CHEST 1 VIEW COMPARISON:  CT 09/30/2022.  Radiographs 05/01/2022 and 03/29/2022. FINDINGS: 0833 hours. The heart size and mediastinal contours are stable. Right suprahilar fiducial marker and adjacent scarring are partly obscured by overlying support apparatus but appear similar to previous CT in this patient with a history of treated non-small cell lung cancer. Left basilar pleuroparenchymal scarring appears stable. No definite superimposed airspace disease, pneumothorax or significant pleural effusion. The bones appear unchanged. IMPRESSION: Treatment changes in the right upper lobe for non-small cell lung cancer, similar to CT of 2  months ago. No definite acute superimposed process. Electronically Signed   By: Richardean Sale M.D.   On: 12/11/2022 08:49    EKG: Independently reviewed.  Sinus tachycardia with rate 126; nonspecific ST changes with no evidence of acute ischemia   Labs on Admission: I have personally reviewed the available labs and imaging studies at the time of the admission.  Pertinent labs:    BMP pending Normal CBC Influenza A positive   Assessment and Plan: Principal Problem:   Influenza A Active Problems:   COPD with acute exacerbation (HCC)   Essential hypertension   Adenocarcinoma, lung, right (HCC)   Dyslipidemia associated with type 2 diabetes mellitus (HCC)   Chronic pain    Influenza -Supportive care -Tamiflu BID -IVF at 100 cc/hr -Will admit to telemetry at [facility] -Anticipate resolution of tachycardia and improvement in symptoms as flu improves  -She was started on Augmentin/Azithro yesterday  but will not continue abx given + flu test, negative CXR, and no leukocytosis  COPD with acute exacerbation -Patient with new O2 requirement, significant wheezing despite continuous nebs x 2 -Suspect that flu has led to a COPD exacerbation -Chest x-ray is not consistent with pneumonia -Nebulizers: scheduled Duoneb and prn albuterol -Solu-Medrol 80 mg IV BID -> Prednisone 40 mg PO daily -Continue Breztri, Singulair -Coordinated care with TOC team/PT/OT/Nutrition/RT consults  HTN -Continue bisoprolol -She is a slow acetylator and so is at increased risk of side effects from hydralazine; will cover with prn IV metoprolol  HLD -Continue atorvastatin  H/o lung cancer -Stage 1 adenocarcinoma -Completed radiation therapy in 11/2021 -No obvious recurrence on today's CXR  DM -Recent A1c 5.8 -Not on medications -Will not cover at this time  Chronic pain -I have reviewed this patient in the [place] Controlled Substances Reporting System.  She is receiving medications from only one  provider and appears to be taking them as prescribed. -He is not at particularly high risk of overdose but is at increased risk of opioid misuse or diversion.  -Continue Lyrica, duloxetine, Percocet upon arrival at destination hospital   The patient was seen in consultation at [facility] and is awaiting transfer to a destination hospital.  She will be seen for H&P and medication reconciliation upon arrival.    Advance Care Planning:   Code Status: Full Code  - Code status was discussed with the patient and/or family at the time of admission.  The patient would want to receive full resuscitative measures at  this time.   Consults: Nutrition; PT/OT  DVT Prophylaxis: Lovenox  Family Communication: Husband was present throughout evaluation  Severity of Illness: The appropriate patient status for this patient is INPATIENT. Inpatient status is judged to be reasonable and necessary in order to provide the required intensity of service to ensure the patient's safety. The patient's presenting symptoms, physical exam findings, and initial radiographic and laboratory data in the context of their chronic comorbidities is felt to place them at high risk for further clinical deterioration. Furthermore, it is not anticipated that the patient will be medically stable for discharge from the hospital within 2 midnights of admission.   * I certify that at the point of admission it is my clinical judgment that the patient will require inpatient hospital care spanning beyond 2 midnights from the point of admission due to high intensity of service, high risk for further deterioration and high frequency of surveillance required.*  Author: Karmen Bongo, MD 12/11/2022 11:13 AM  For on call review [email or web address].

## 2022-12-11 NOTE — H&P (Signed)
History and Physical    Patient: [name] GBT:[id number] DOB: [birth year] DOA: 12/11/2022 DOS: the patient was seen and examined on 12/11/2022 PCP: [name], MD  Patient coming from: Home  Chief Complaint:  Chief Complaint  Patient presents with   Cough   HPI: [name] is a 76 y.o. female with medical history significant for COPD, history of adenocarcinoma of the right upper lobe status post radiation therapy (12/22), hypertension, diabetes mellitus who presents to the emergency room for evaluation of a cough. Patient states symptoms started 3 days prior to her admission and was initially a dry cough that progressed to a cough productive of yellow phlegm associated with exertional shortness of breath and diffuse wheezing.  She also complains of a splitting headache and generalized weakness.  She denies having any chest pain, no fever, no nasal congestion, no sick contacts, no abdominal pain, no changes in her bowel habits, no urinary symptoms, no dizziness, no lightheadedness, no blurred vision, no focal deficit. Upon arrival to the ER she was noted to be hypoxic with room air pulse oximetry of 87% and is currently on 2 L of oxygen via nasal cannula to maintain pulse oximetry greater than 92%. Significant labs include potassium of 2.7 Her influenza A PCR is positive Chest x-ray shows treatment changes in the right upper lobe for non-small cell lung cancer, similar to CT of 2 months ago. No definite acute superimposed process. She received IV Solu-Medrol 125 mg, multiple nebulizer treatments as well as potassium supplementation and will be admitted to the hospital for further evaluation.   Review of Systems: As mentioned in the history of present illness. All other systems reviewed and are negative. Past Medical History:  Diagnosis Date   Arthritis    Asthma    Complication of anesthesia    COPD (chronic obstructive pulmonary disease) ([place])    Diabetes ([place])    High  cholesterol    History of radiation therapy    Right Lung- 12/04/21-12/11/21- Dr. Gery Pray   Hypertension    Lung cancer ([place])    Pneumonia 09/10/2017   PONV (postoperative nausea and vomiting)    Stroke [facility]) 2007   mini strokes   Past Surgical History:  Procedure Laterality Date   ABDOMINAL HYSTERECTOMY  1995   BRONCHIAL BIOPSY  10/23/2021   Procedure: BRONCHIAL BIOPSIES;  Surgeon: Garner Nash, DO;  Location: [facility];  Service: Pulmonary;;   BRONCHIAL BRUSHINGS  10/23/2021   Procedure: BRONCHIAL BRUSHINGS;  Surgeon: Garner Nash, DO;  Location: [facility];  Service: Pulmonary;;   BRONCHIAL NEEDLE ASPIRATION BIOPSY  10/23/2021   Procedure: BRONCHIAL NEEDLE ASPIRATION BIOPSIES;  Surgeon: Garner Nash, DO;  Location: [facility];  Service: Pulmonary;;   carpel tunnel surgery Right 2006   COLONOSCOPY  2014   COLOSTOMY  2007   colostomy let down  2008   endoscopy  2014   FIDUCIAL MARKER PLACEMENT  10/23/2021   Procedure: FIDUCIAL MARKER PLACEMENT;  Surgeon: Garner Nash, DO;  Location: [facility];  Service: Pulmonary;;   rotator cuff tear Left 04/14/2020   VIDEO BRONCHOSCOPY WITH ENDOBRONCHIAL NAVIGATION Right 10/23/2021   Procedure: VIDEO BRONCHOSCOPY WITH ENDOBRONCHIAL NAVIGATION;  Surgeon: Garner Nash, DO;  Location: [facility];  Service: Pulmonary;  Laterality: Right;  ION, w/ fiducial placement   VIDEO BRONCHOSCOPY WITH RADIAL ENDOBRONCHIAL ULTRASOUND  10/23/2021   Procedure: VIDEO BRONCHOSCOPY WITH RADIAL ENDOBRONCHIAL ULTRASOUND;  Surgeon: Garner Nash, DO;  Location: [facility];  Service: Pulmonary;;  Social History:  reports that she quit smoking about 16 years ago. Her smoking use included cigarettes. She started smoking about 55 years ago. She has a 21.00 pack-year smoking history. She has never used smokeless tobacco. She reports that she does not currently use alcohol. She reports that she does not use drugs.  Allergies   Allergen Reactions   Other Swelling    HAIR DYE    Family History  Problem Relation Age of Onset   Heart murmur Mother    Heart attack Father    Diabetes Father    Diabetes Brother     Prior to Admission medications   Medication Sig Start Date End Date Taking? Authorizing Provider  albuterol (VENTOLIN HFA) 108 (90 Base) MCG/ACT inhaler INHALE 2 PUFFS INTO THE LUNGS EVERY 4 HOURS AS NEEDED FOR WHEEZING OR SHORTNESS OF BREATH 04/22/22  Yes [name], MD  amoxicillin-clavulanate (AUGMENTIN) 875-125 MG tablet Take 1 tablet by mouth 2 (two) times daily. 12/10/22  Yes [provider]  aspirin 81 MG tablet Take 81 mg by mouth daily.   Yes [provider]  atorvastatin (LIPITOR) 80 MG tablet Take one tab po M-F and skip the weekends 07/07/22  Yes [name], MD  azithromycin (ZITHROMAX) 250 MG tablet Take 250 mg by mouth as directed. 12/10/22  Yes [provider]  Biotin 10000 MCG TBDP Take 10,000 mcg by mouth daily.   Yes [provider]  bisoprolol (ZEBETA) 5 MG tablet TAKE 1/2 TABLET BY MOUTH EVERY DAY 11/29/22  Yes [name], MD  Budeson-Glycopyrrol-Formoterol (BREZTRI AEROSPHERE) 160-9-4.8 MCG/ACT AERO Inhale 2 puffs into the lungs 2 (two) times daily. 09/19/22  Yes [name], MD  CALCIUM-MAGNESIUM-ZINC PO Take 1 capsule by mouth at bedtime.   Yes [provider]  Cholecalciferol (VITAMIN D3) 2000 UNITS TABS Take 2,000 Units by mouth at bedtime.   Yes [provider]  DULoxetine (CYMBALTA) 60 MG capsule Take 1 capsule (60 mg total) by mouth daily. 07/20/22 07/20/23 Yes [name], MD  famotidine (PEPCID) 20 MG tablet One after supper 11/11/22  Yes Tanda Rockers, MD  ipratropium-albuterol (DUONEB) 0.5-2.5 (3) MG/3ML SOLN USE 3 ML VIA NEBULIZER EVERY 6 HOURS AS NEEDED 08/23/21  Yes Minette Brine, FNP  Magnesium 250 MG TABS Take 1 tablet by mouth daily.   Yes [provider]  montelukast (SINGULAIR) 10 MG tablet  TAKE 1 TABLET(10 MG) BY MOUTH AT BEDTIME 10/22/22  Yes Tanda Rockers, MD  oxyCODONE-acetaminophen (PERCOCET) 10-325 MG tablet Take 1 tablet by mouth 4 (four) times daily as needed. 04/18/22  Yes [provider]  pantoprazole (PROTONIX) 40 MG tablet TAKE 1 TABLET BY MOUTH 30 MINUTES BEFORE FIRST MEAL OF THE DAY 07/10/22  Yes [name], MD  pregabalin (LYRICA) 100 MG capsule Take 100 mg by mouth 3 (three) times daily. 11/22/22  Yes [provider]  Semaglutide, 1 MG/DOSE, (OZEMPIC, 1 MG/DOSE,) 4 MG/3ML SOPN Inject 1 mg into the skin once a week. Friday 04/23/22  Yes [name], MD  glucose blood (ACCU-CHEK GUIDE) test strip 1 each by Other route daily. Use as instructed to check blood sugars 1 time per day dx: e11.22 09/10/19   [name], MD    Physical Exam: Vitals:   12/11/22 1834 12/11/22 1900 12/11/22 1947 12/11/22 2000  BP: (!) 140/71     Pulse: 95     Resp: 20 (!) 29 19 (!) 29  Temp: 97.6 F (36.4 C)     TempSrc: Oral  SpO2: 97%      Physical Exam Vitals and nursing note reviewed.  Constitutional:      Appearance: Normal appearance.     Comments: Appears comfortable and in no distress  HENT:     Head: Normocephalic and atraumatic.     Nose: Nose normal.     Mouth/Throat:     Mouth: Mucous membranes are moist.  Eyes:     Conjunctiva/sclera: Conjunctivae normal.  Cardiovascular:     Rate and Rhythm: Normal rate and regular rhythm.  Pulmonary:     Breath sounds: Wheezing present.     Comments: Tachypnea, diffuse respiratory wheezes Abdominal:     General: Abdomen is flat. Bowel sounds are normal.     Palpations: Abdomen is soft.  Musculoskeletal:        General: Normal range of motion.     Cervical back: Normal range of motion and neck supple.  Skin:    General: Skin is warm and dry.  Neurological:     General: No focal deficit present.     Mental Status: She is alert.  Psychiatric:        Mood and Affect: Mood normal.         Behavior: Behavior normal.     Data Reviewed: Relevant notes from primary care and specialist visits, past discharge summaries as available in EHR, including Care Everywhere. Prior diagnostic testing as pertinent to current admission diagnoses Updated medications and problem lists for reconciliation ED course, including vitals, labs, imaging, treatment and response to treatment Triage notes, nursing and pharmacy notes and ED provider's notes Notable results as noted in HPI Labs reviewed.  Magnesium 2.4, sodium 138, potassium 2.7, chloride 96, bicarb 29, glucose 184, BUN 9, creatinine 0.85, calcium 9.0, white count 5.7, hemoglobin 13.3, hematocrit 39.8, platelet count 277 Twelve-lead EKG reviewed by me shows sinus tachycardia There are no new results to review at this time.  Assessment and Plan: * COPD with acute exacerbation ([place]) Patient with a known history of COPD who presents to the ER for evaluation of a productive cough, exertional shortness of breath and wheezing with associated hypoxia.  Patient had room air pulse oximetry of 87% and is currently on 2 L of oxygen to maintain pulse oximetry greater than 92% Acute COPD exacerbation is most likely secondary to influenza A viral infection Place patient on scheduled and as needed bronchodilator therapy Place patient on systemic steroids as well as inhaled steroids Supportive care with antitussives Continue Singulair Will attempt to wean off oxygen once acute illness improves or resolves  Influenza A Place patient on Tamiflu Supportive care with antipyretics and antitussive  Essential hypertension Continue bisoprolol  Adenocarcinoma, lung, right (HCC) History of adenocarcinoma of the right lung status post radiation therapy Follow-up with radiation oncology as an outpatient  Dyslipidemia associated with type 2 diabetes mellitus ([place]) Place patient on sliding scale insulin with Accu-Cheks before meals and at bedtime Maintain  consistent carbohydrate diet      Advance Care Planning:   Code Status: Full Code   Consults: None  Family Communication: Greater than 50% of time was spent discussing plan of care with patient at the bedside.  All questions and concerns have been addressed.  She verbalizes understanding and agrees with the plan.  CODE STATUS was discussed and she wishes to be full code  Severity of Illness: The appropriate patient status for this patient is INPATIENT. Inpatient status is judged to be reasonable and necessary in order to provide the required  intensity of service to ensure the patient's safety. The patient's presenting symptoms, physical exam findings, and initial radiographic and laboratory data in the context of their chronic comorbidities is felt to place them at high risk for further clinical deterioration. Furthermore, it is not anticipated that the patient will be medically stable for discharge from the hospital within 2 midnights of admission.   * I certify that at the point of admission it is my clinical judgment that the patient will require inpatient hospital care spanning beyond 2 midnights from the point of admission due to high intensity of service, high risk for further deterioration and high frequency of surveillance required.*  Author: Collier Bullock, MD 12/11/2022 8:52 PM  For on call review [email or web address].

## 2022-12-11 NOTE — Assessment & Plan Note (Signed)
History of adenocarcinoma of the right lung status post radiation therapy Follow-up with radiation oncology as an outpatient

## 2022-12-11 NOTE — Assessment & Plan Note (Signed)
Place patient on sliding scale insulin with Accu-Cheks before meals and at bedtime Maintain consistent carbohydrate diet

## 2022-12-11 NOTE — ED Notes (Signed)
Called [facility], spoke to [place]; gave updated information for patient waiting on Bed Assignment

## 2022-12-11 NOTE — ED Triage Notes (Signed)
Pt here from home with c/o cough congestion and sob , worse with walking , also with headaches , no fevers

## 2022-12-11 NOTE — Assessment & Plan Note (Signed)
-   Continue bisoprolol 

## 2022-12-11 NOTE — Assessment & Plan Note (Addendum)
Place patient on Tamiflu Supportive care with antipyretics and antitussive

## 2022-12-12 LAB — BASIC METABOLIC PANEL
Anion gap: 7 (ref 5–15)
BUN: 9 mg/dL (ref 8–23)
CO2: 27 mmol/L (ref 22–32)
Calcium: 9.3 mg/dL (ref 8.9–10.3)
Chloride: 107 mmol/L (ref 98–111)
Creatinine, Ser: 0.67 mg/dL (ref 0.44–1.00)
GFR, Estimated: >60 mL/min (ref >60)
Glucose, Bld: 119 mg/dL — ABNORMAL HIGH (ref 70–99)
Potassium: 4.7 mmol/L (ref 3.5–5.1)
Sodium: 141 mmol/L (ref 135–145)

## 2022-12-12 LAB — GLUCOSE, CAPILLARY
Glucose-Capillary: 129 mg/dL — ABNORMAL HIGH (ref 70–99)
Glucose-Capillary: 139 mg/dL — ABNORMAL HIGH (ref 70–99)
Glucose-Capillary: 131 mg/dL — ABNORMAL HIGH (ref 70–99)
Glucose-Capillary: 123 mg/dL — ABNORMAL HIGH (ref 70–99)

## 2022-12-12 LAB — HIV ANTIBODY (ROUTINE TESTING W REFLEX): HIV Screen 4th Generation wRfx: NONREACTIVE

## 2022-12-12 LAB — CBC
HCT: 40 % (ref 36.0–46.0)
Hemoglobin: 13 g/dL (ref 12.0–15.0)
MCH: 29.9 pg (ref 26.0–34.0)
MCHC: 32.5 g/dL (ref 30.0–36.0)
MCV: 92 fL (ref 80.0–100.0)
Platelets: 300 10*3/uL (ref 150–400)
RBC: 4.35 MIL/uL (ref 3.87–5.11)
RDW: 14.1 % (ref 11.5–15.5)
WBC: 5.6 10*3/uL (ref 4.0–10.5)
nRBC: 0 % (ref 0.0–0.2)

## 2022-12-12 MED ORDER — AZITHROMYCIN 250 MG PO TABS
500.0000 mg | ORAL_TABLET | Freq: Every day | ORAL | Status: AC
  Administered 2022-12-12 – 2022-12-14 (×3): 500 mg via ORAL
  Filled 2022-12-12 (×3): qty 2

## 2022-12-12 MED ORDER — HYDROCORTISONE 1 % EX CREA
TOPICAL_CREAM | Freq: Three times a day (TID) | CUTANEOUS | Status: DC
  Administered 2022-12-12 – 2022-12-14 (×4): 1 via TOPICAL
  Filled 2022-12-12: qty 28

## 2022-12-12 MED ORDER — ENSURE ENLIVE PO LIQD
237.0000 mL | Freq: Two times a day (BID) | ORAL | Status: DC
  Administered 2022-12-12 – 2022-12-14 (×5): 237 mL via ORAL

## 2022-12-12 NOTE — Progress Notes (Signed)
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  On presentation, she was hypoxic requiring 2 L of oxygen.  Lab work showed potassium 2.7.  Influenza PCR positive.  Chest x-ray showed treatment changes in the right upper lobe for non-small cell lung cancer.  Patient was started on IV steroids, Tamiflu.  Assessment & Plan:  Principal Problem:   COPD with acute exacerbation ([place]) Active Problems:   Influenza A   Essential hypertension   Adenocarcinoma, lung, right ([place])   Dyslipidemia associated with type 2 diabetes mellitus (HCC)   Chronic pain   Acute COPD exacerbation: Presented with exertional dyspnea, wheezing, yellow sputum.  COPD exacerbation secondary to influenza.  Continue bronchodilators, steroids.  Continue cough medications.  On Singulair.  Will also start on azithromycin.  Acute hypoxic respiratory failure: Secondary to COPD/flu.  Not on oxygen at home.  Will continue to wean  Influenza A: On Tamiflu.  Supportive care with antibiotics, antitussives.  Hypertension: Currently blood pressure stable.  Continue bisoprolol  Right lung adenocarcinoma: Status post radiation therapy.  Follows with radiation oncology as an outpatient  Hyperlipidemia: On statin  Diabetes type 2: Currently on sliding scale insulin.  Monitor blood sugars.        DVT prophylaxis:enoxaparin (LOVENOX) injection 40 mg Start: 12/11/22 1200     Code Status: Full Code  Family Communication: Husband at bedside  Patient status:Inpatient  Patient is from :Home  Anticipated discharge VP:XTGG  Estimated DC date:tomorrow   Consultants:  None  Procedures:None  Antimicrobials:  Anti-infectives (From admission, onward)    Start     Dose/Rate Route Frequency Ordered Stop   12/11/22 1130  oseltamivir (TAMIFLU) capsule 75 mg        75 mg Oral 2 times daily 12/11/22 1100 12/16/22 0959   12/11/22 1030  oseltamivir (TAMIFLU) capsule 75 mg  Status:  Discontinued        75 mg Oral  Once 12/11/22 1027 12/11/22 1100       Subjective: Patient seen and examined at bedside today.  Hemodynamically stable.  On 2 L of oxygen per minute.  Still having cough.  Husband at bedside.  Denies any worsening shortness of breath   Objective: Vitals:   12/11/22 1947 12/11/22 2000 12/11/22 2136 12/12/22 0340  BP:   (!) 145/74 (!) 158/85  Pulse:   93 70  Resp: 19 (!) 29 20 (!) 22  Temp:   98.4 F (36.9 C) 97.8 F (36.6 C)  TempSrc:   Oral Oral  SpO2:   94% 100%    Intake/Output Summary (Last 24 hours) at 12/12/2022 0743 Last data filed at 12/11/2022 1855 Gross per 24 hour  Intake 495.76 ml  Output --  Net 495.76 ml   There were no vitals filed for this visit.  Examination:  General exam: Overall comfortable, not in distress HEENT: PERRL Respiratory system: Diminished sounds bilaterally, expiratory wheezing Cardiovascular system: S1 & S2 heard, RRR.  Gastrointestinal system: Abdomen is nondistended, soft and nontender. Central nervous system: Alert and oriented Extremities: No edema, no clubbing ,no cyanosis Skin: No rashes, no ulcers,no icterus     Data Reviewed: I have personally reviewed following labs and imaging studies  CBC: Recent Labs  Lab 12/11/22  1031  WBC 5.7  NEUTROABS 4.4  HGB 13.3  HCT 39.8  MCV 90.5  PLT 638   Basic Metabolic Panel: Recent Labs  Lab 12/11/22 1031 12/11/22 1920  NA 138  --   K 2.7*  --   CL 96*  --   CO2 29  --   GLUCOSE 184*  --   BUN 9  --   CREATININE 0.85  --   CALCIUM 9.0  --   MG  --  2.4     Recent Results (from the past 240 hour(s))  Resp panel by RT-PCR (RSV,  Flu A&B, Covid) Anterior Nasal Swab     Status: Abnormal   Collection Time: 12/11/22  7:55 AM   Specimen: Anterior Nasal Swab  Result Value Ref Range Status   SARS Coronavirus 2 by RT PCR NEGATIVE NEGATIVE Final    Comment: (NOTE) SARS-CoV-2 target nucleic acids are NOT DETECTED.  The SARS-CoV-2 RNA is generally detectable in upper respiratory specimens during the acute phase of infection. The lowest concentration of SARS-CoV-2 viral copies this assay can detect is 138 copies/mL. A negative result does not preclude SARS-Cov-2 infection and should not be used as the sole basis for treatment or other patient management decisions. A negative result may occur with  improper specimen collection/handling, submission of specimen other than nasopharyngeal swab, presence of viral mutation(s) within the areas targeted by this assay, and inadequate number of viral copies(<138 copies/mL). A negative result must be combined with clinical observations, patient history, and epidemiological information. The expected result is Negative.  Fact Sheet for Patients:  EntrepreneurPulse.com.au  Fact Sheet for Healthcare Providers:  IncredibleEmployment.be  This test is no t yet approved or cleared by the [place] FDA and  has been authorized for detection and/or diagnosis of SARS-CoV-2 by FDA under an Emergency Use Authorization (EUA). This EUA will remain  in effect (meaning this test can be used) for the duration of the COVID-19 declaration under Section 564(b)(1) of the Act, 21 U.S.C.section 360bbb-3(b)(1), unless the authorization is terminated  or revoked sooner.       Influenza A by PCR POSITIVE (A) NEGATIVE Final   Influenza B by PCR NEGATIVE NEGATIVE Final    Comment: (NOTE) The Xpert Xpress SARS-CoV-2/FLU/RSV plus assay is intended as an aid in the diagnosis of influenza from Nasopharyngeal swab specimens and should not be used as a sole basis for  treatment. Nasal washings and aspirates are unacceptable for Xpert Xpress SARS-CoV-2/FLU/RSV testing.  Fact Sheet for Patients: EntrepreneurPulse.com.au  Fact Sheet for Healthcare Providers: IncredibleEmployment.be  This test is not yet approved or cleared by the [place] FDA and has been authorized for detection and/or diagnosis of SARS-CoV-2 by FDA under an Emergency Use Authorization (EUA). This EUA will remain in effect (meaning this test can be used) for the duration of the COVID-19 declaration under Section 564(b)(1) of the Act, 21 U.S.C. section 360bbb-3(b)(1), unless the authorization is terminated or revoked.     Resp Syncytial Virus by PCR NEGATIVE NEGATIVE Final    Comment: (NOTE) Fact Sheet for Patients: EntrepreneurPulse.com.au  Fact Sheet for Healthcare Providers: IncredibleEmployment.be  This test is not yet approved or cleared by the [place] FDA and has been authorized for detection and/or diagnosis of SARS-CoV-2 by FDA under an Emergency Use Authorization (EUA). This EUA will remain in effect (meaning this test can be used) for the duration of the COVID-19 declaration under Section 564(b)(1) of the Act, 21 U.S.C. section 360bbb-3(b)(1), unless the authorization  is terminated or revoked.  Performed at [facility], [address], [place], [place] [address]      Radiology Studies: DG Chest Portable 1 View  Result Date: 12/11/2022 CLINICAL DATA:  Cough, congestion and trouble breathing for 4 days. EXAM: PORTABLE CHEST 1 VIEW COMPARISON:  CT 09/30/2022.  Radiographs 05/01/2022 and 03/29/2022. FINDINGS: 0833 hours. The heart size and mediastinal contours are stable. Right suprahilar fiducial marker and adjacent scarring are partly obscured by overlying support apparatus but appear similar to previous CT in this patient with a history of treated non-small cell  lung cancer. Left basilar pleuroparenchymal scarring appears stable. No definite superimposed airspace disease, pneumothorax or significant pleural effusion. The bones appear unchanged. IMPRESSION: Treatment changes in the right upper lobe for non-small cell lung cancer, similar to CT of 2 months ago. No definite acute superimposed process. Electronically Signed   By: Richardean Sale M.D.   On: 12/11/2022 08:49    Scheduled Meds:  aspirin  81 mg Oral Daily   atorvastatin  80 mg Oral Once per day on Mon Tue Wed Thu Fri   bisoprolol  2.5 mg Oral Daily   docusate sodium  100 mg Oral BID   DULoxetine  60 mg Oral Daily   enoxaparin (LOVENOX) injection  40 mg Subcutaneous Q24H   fluticasone furoate-vilanterol  1 puff Inhalation Daily   And   umeclidinium bromide  1 puff Inhalation Daily   insulin aspart  0-15 Units Subcutaneous TID WC   ipratropium-albuterol  3 mL Nebulization TID   magnesium oxide  200 mg Oral Daily   methylPREDNISolone (SOLU-MEDROL) injection  40 mg Intravenous Q12H   Followed by   Derrill Memo ON 12/13/2022] predniSONE  40 mg Oral Q breakfast   montelukast  10 mg Oral QHS   oseltamivir  75 mg Oral BID   pantoprazole  40 mg Oral QAC breakfast   potassium chloride  40 mEq Oral Once   predniSONE  40 mg Oral Q breakfast   pregabalin  100 mg Oral TID   sodium chloride flush  3 mL Intravenous Q12H   Continuous Infusions:  lactated ringers 75 mL/hr at 12/11/22 1845     LOS: 1 day   Shelly Coss, MD [facility] P12/14/2023, 7:43 AM

## 2022-12-12 NOTE — Evaluation (Signed)
Physical Therapy Evaluation Patient Details Name: [name] MRN: [id number] DOB: January 07, 1946 Today's Date: 12/12/2022  History of Present Illness  Pt is a 76 y/o F admitted on 12/11/22 after presenting to the ED with c/o cough associated with exertional SOB & diffuse wheezing. Pt also c/o HA & generalized weakness. Upon ER arrival, pt noted to be hypoxic on room air. Pt found to be + for Influenza A.PMH: COPD, adenocarcinoma of RUL s/p radiation therapy, HTN, DM, asthma, arthritis, stroke  Clinical Impression  Pt seen for PT evaluation with pt reporting prior to admission she was independent without AD. On this date, pt is ambulatory without AD without LOB. Attempted O2 reading on first attempt but poor pleth waveform & suspect inaccurate reading (SpO2 reading 81%, HR 144 on room air after first ambulation trial) but able to obtain reading during 2nd ambulation trial with SpO2 >90% HR in the 90s bpm while on room air. Pt does not require acute PT services at this time. PT to complete current orders.     Recommendations for follow up therapy are one component of a multi-disciplinary discharge planning process, led by the attending physician.  Recommendations may be updated based on patient status, additional functional criteria and insurance authorization.  Follow Up Recommendations No PT follow up      Assistance Recommended at Discharge PRN  Patient can return home with the following       Equipment Recommendations None recommended by PT  Recommendations for Other Services       Functional Status Assessment Patient has not had a recent decline in their functional status     Precautions / Restrictions Precautions Precautions: None Restrictions Weight Bearing Restrictions: No      Mobility  Bed Mobility               General bed mobility comments: not tested, pt received sitting & left sitting    Transfers Overall transfer level: Independent                  General transfer comment: STS from furniture without AD independently    Ambulation/Gait Ambulation/Gait assistance: Independent Gait Distance (Feet): 150 Feet (+ 100 ft) Assistive device: None Gait Pattern/deviations: WFL(Within Functional Limits) Gait velocity: WNL     General Gait Details: slight R lateral lean but no LOB  Stairs            Wheelchair Mobility    Modified Rankin (Stroke Patients Only)       Balance     Sitting balance-Leahy Scale: Normal       Standing balance-Leahy Scale: Good                               Pertinent Vitals/Pain Pain Assessment Pain Assessment: No/denies pain    Home Living Family/patient expects to be discharged to:: Private residence Living Arrangements: Spouse/significant other;Children Available Help at Discharge: Family Type of Home: House       Alternate Level Stairs-Number of Steps: 16 steps garage to upstairs room Home Layout: Multi-level Home Equipment: None      Prior Function Prior Level of Function : Independent/Modified Independent                     Hand Dominance        Extremity/Trunk Assessment   Upper Extremity Assessment Upper Extremity Assessment: Overall WFL for tasks assessed    Lower Extremity Assessment  Lower Extremity Assessment: Overall WFL for tasks assessed       Communication   Communication: No difficulties  Cognition Arousal/Alertness: Awake/alert Behavior During Therapy: WFL for tasks assessed/performed Overall Cognitive Status: Within Functional Limits for tasks assessed                                          General Comments      Exercises     Assessment/Plan    PT Assessment Patient does not need any further PT services  PT Problem List         PT Treatment Interventions      PT Goals (Current goals can be found in the Care Plan section)  Acute Rehab PT Goals Patient Stated Goal: go home PT Goal  Formulation: With patient Time For Goal Achievement: 12/26/22 Potential to Achieve Goals: Good    Frequency       Co-evaluation               AM-PAC PT "6 Clicks" Mobility  Outcome Measure Help needed turning from your back to your side while in a flat bed without using bedrails?: None Help needed moving from lying on your back to sitting on the side of a flat bed without using bedrails?: None Help needed moving to and from a bed to a chair (including a wheelchair)?: None Help needed standing up from a chair using your arms (e.g., wheelchair or bedside chair)?: None Help needed to walk in hospital room?: None Help needed climbing 3-5 steps with a railing? : None 6 Click Score: 24    End of Session   Activity Tolerance: Patient tolerated treatment well Patient left: in bed;with call bell/phone within reach Nurse Communication: Mobility status (O2)      Time: 1552-0802 PT Time Calculation (min) (ACUTE ONLY): 14 min   Charges:   PT Evaluation $PT Eval Low Complexity: [place], PT, DPT 12/12/22, 12:13 PM   Waunita Schooner 12/12/2022, 12:12 PM

## 2022-12-12 NOTE — Progress Notes (Signed)
OT Cancellation Note  Patient Details Name: [name] MRN: [id number] DOB: Dec 03, 1946   Cancelled Treatment:    Reason Eval/Treat Not Completed: OT screened initiated and pt determined to be without therapy needs.OT spoke with pt's evaluating PT who indicated the pt is without acute functional deficits and appears to be at her baseline level of functioning. The pt has been managing her self-care tasks, (including showering) independently during her hospital stay. OT will sign off.   Leota Sauers, OTR/L 12/12/2022, 4:48 PM

## 2022-12-12 NOTE — Progress Notes (Signed)
Initial Nutrition Assessment  DOCUMENTATION CODES:   Non-severe (moderate) malnutrition in context of chronic illness  INTERVENTION:  - Liberalize diet to Regular to avoid restricting oral intake.  - Ensure Enlive po BID, each supplement provides 350 kcal and 20 grams of protein. - Encourage intake at all meals. - Monitor weight trends.   NUTRITION DIAGNOSIS:   Moderate Malnutrition related to chronic illness (lung cancer) as evidenced by moderate muscle depletion, moderate fat depletion, percent weight loss (27.5% in 11 months).  GOAL:   Patient will meet greater than or equal to 90% of their needs  MONITOR:   PO intake, Supplement acceptance, Weight trends  REASON FOR ASSESSMENT:   Consult Assessment of nutrition requirement/status  ASSESSMENT:   76 y.o. female with medical history significant for COPD, history of adenocarcinoma of the right upper lobe status post radiation therapy (12/22), hypertension, diabetes mellitus who presents to the emergency room for evaluation of a cough. Found to have acute exacerbation of COPD and Influenza A.   Met with patient and husband at bedside. Patient reports a UBW of 200# she last weighed around January and weight loss since that time.  Attributes weight loss to a combination of not eating well due to pain after radiation as well as being on Ozempic. Per EMR, patient weighed at 200# in January and weighed this admission at 145# - a 55# or 27.5% weight loss in 11 months, severe.   Patient admits she hardly eats at all. Notes she has never been one to eat 3 meals a day but that over the past few months will only have a V8 energy drink in the morning and a small snack (example: cream of wheat) around dinner time. Admits she could go an entire day without eating and be fine. Her appetite is simply very poor.  She is currently upset about being on a carb/heart healthy restricted diet. Discussed with MD and can liberalize to Regular to avoid  restricting intake. She is agreeable to receive nutrition supplements during admission. Notes she used to drink them but stopped.     Medications reviewed and include: Colace, Insulin   Labs reviewed:  HA1C 5.6   NUTRITION - FOCUSED PHYSICAL EXAM:  Flowsheet Row Most Recent Value  Orbital Region Mild depletion  Upper Arm Region Moderate depletion  Thoracic and Lumbar Region Mild depletion  Buccal Region Moderate depletion  Temple Region Mild depletion  Clavicle Bone Region Moderate depletion  Clavicle and Acromion Bone Region Moderate depletion  Scapular Bone Region Unable to assess  Dorsal Hand Mild depletion  Patellar Region Mild depletion  Anterior Thigh Region Mild depletion  Posterior Calf Region Moderate depletion  Edema (RD Assessment) None  Hair Reviewed  Eyes Reviewed  Mouth Reviewed  Skin Reviewed  Nails Reviewed       Diet Order:   Diet Order             Diet regular Room service appropriate? Yes; Fluid consistency: Thin  Diet effective now                   EDUCATION NEEDS:  Education needs have been addressed  Skin:  Skin Assessment: Reviewed RN Assessment  Last BM:  12/14  Height:  Ht Readings from Last 1 Encounters:  12/12/22 _0  (1.676 m)   Weight:  Wt Readings from Last 1 Encounters:  12/12/22 66 kg   Ideal Body Weight:  59 kg  BMI:  Body mass index is 23.47 kg/m.  Estimated  Nutritional Needs:  Kcal:  1850-2100 kcals Protein:  90-105 grams Fluid:  >/= 1.8L    Samson Frederic RD, LDN For contact information, refer to [facility].

## 2022-12-12 NOTE — TOC Initial Note (Signed)
Transition of Care [facility]) - Initial/Assessment Note    Patient Details  Name: [name] MRN: [id number] Date of Birth: 21-May-1946  Transition of Care [facility]) CM/SW Contact:    Leeroy Cha, RN Phone Number: 12/12/2022, 7:36 AM  Clinical Narrative:                  Transition of Care [facility]) Screening Note   Patient Details  Name: [name] Date of Birth: 1946-08-03   Transition of Care [facility]) CM/SW Contact:    Leeroy Cha, RN Phone Number: 12/12/2022, 7:36 AM    Transition of Care Department [facility]) has reviewed patient and no TOC needs have been identified at this time. We will continue to monitor patient advancement through interdisciplinary progression rounds. If new patient transition needs arise, please place a TOC consult.    Expected Discharge Plan: Home/Self Care Barriers to Discharge: Continued Medical Work up   Patient Goals and CMS Choice Patient states their goals for this hospitalization and ongoing recovery are:: to go back home CMS Medicare.gov Compare Post Acute Care list provided to:: Patient Choice offered to / list presented to : Patient  Expected Discharge Plan and Services Expected Discharge Plan: Home/Self Care   Discharge Planning Services: CM Consult   Living arrangements for the past 2 months: Single Family Home                                      Prior Living Arrangements/Services Living arrangements for the past 2 months: Single Family Home Lives with:: Spouse Patient language and need for interpreter reviewed:: Yes Do you feel safe going back to the place where you live?: Yes            Criminal Activity/Legal Involvement Pertinent to Current Situation/Hospitalization: No - Comment as needed  Activities of Daily Living      Permission Sought/Granted                  Emotional Assessment Appearance:: Appears stated age Attitude/Demeanor/Rapport: Engaged Affect (typically observed):  Calm Orientation: : Oriented to Self, Oriented to Place, Oriented to  Time, Oriented to Situation Alcohol / Substance Use: Tobacco Use (former smoker quit 15 years ago) Psych Involvement: No (comment)  Admission diagnosis:  Influenza A [J10.1] COPD exacerbation ([place]) [J44.1] Acute respiratory failure with hypoxia ([place]) [J96.01] Patient Active Problem List   Diagnosis Date Noted   Influenza A 12/11/2022   Chronic pain 12/11/2022   Dyslipidemia associated with type 2 diabetes mellitus ([place]) 08/10/2022   BMI 27.0-27.9,adult 08/10/2022   Adenocarcinoma, lung, right ([place]) 05/28/2022   Hypertensive heart disease without heart failure 12/31/2021   Atherosclerosis of aorta ([place]) 09/16/2021   Atherosclerosis 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 09/16/2021   Myalgia 09/16/2021   Solitary pulmonary nodule on lung CT 08/24/2021   Chest tightness 04/18/2021   Former smoker 08/21/2020   Pure hypercholesterolemia    Type II diabetes mellitus, uncontrolled 11/09/2018   Diarrhea    Community acquired pneumonia of left lower lobe of lung    Rhinovirus infection 10/02/2016   Acute asthma exacerbation 10/02/2016   COPD with acute exacerbation ([place]) 09/28/2016   Hypoxia 09/28/2016   Shortness of breath 09/28/2016   Essential hypertension 11/03/2014   Cough 10/19/2014   COPD GOLD II  03/21/2014   Rhinitis, nonallergic 03/21/2014   PCP:  [name], MD Pharmacy:  [facility] [place] [facility] [place] AT [facility] [address] [place] [address] Phone: [phone] Fax: [place], [place] [address] [place] [address] Phone: [phone] Fax: [phone]     Social Determinants of Health (SDOH) Interventions    Readmission Risk Interventions   No data to display

## 2022-12-13 LAB — GLUCOSE, CAPILLARY
Glucose-Capillary: 94 mg/dL (ref 70–99)
Glucose-Capillary: 108 mg/dL — ABNORMAL HIGH (ref 70–99)
Glucose-Capillary: 142 mg/dL — ABNORMAL HIGH (ref 70–99)
Glucose-Capillary: 107 mg/dL — ABNORMAL HIGH (ref 70–99)

## 2022-12-13 MED ORDER — METHYLPREDNISOLONE SODIUM SUCC 125 MG IJ SOLR
120.0000 mg | INTRAMUSCULAR | Status: DC
  Administered 2022-12-13: 120 mg via INTRAVENOUS
  Filled 2022-12-13: qty 2

## 2022-12-13 MED ORDER — GUAIFENESIN ER 600 MG PO TB12
600.0000 mg | ORAL_TABLET | Freq: Two times a day (BID) | ORAL | Status: DC
  Administered 2022-12-13 – 2022-12-14 (×3): 600 mg via ORAL
  Filled 2022-12-13 (×3): qty 1

## 2022-12-13 MED ORDER — HYDROCOD POLI-CHLORPHE POLI ER 10-8 MG/5ML PO SUER
5.0000 mL | Freq: Two times a day (BID) | ORAL | Status: DC
  Administered 2022-12-13 – 2022-12-14 (×3): 5 mL via ORAL
  Filled 2022-12-13 (×3): qty 5

## 2022-12-13 NOTE — Progress Notes (Signed)
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  On presentation, she was hypoxic requiring 2 L of oxygen.  Lab work showed potassium 2.7.  Influenza PCR positive.  Chest x-ray showed treatment changes in the right upper lobe for non-small cell lung cancer.  Patient was started on IV steroids, Tamiflu.  Assessment & Plan:  Principal Problem:   COPD with acute exacerbation ([place]) Active Problems:   Influenza A   Essential hypertension   Adenocarcinoma, lung, right ([place])   Dyslipidemia associated with type 2 diabetes mellitus ([facility])   Chronic pain   Acute COPD exacerbation: Presented with exertional dyspnea, wheezing, yellow sputum.  COPD exacerbation secondary to influenza.  Continue bronchodilators, steroids.  Continue cough medications.  On Singulair. On azithromycin. Wheezing badly today so oral steroids chnaged to IV, added Tussionex for cough  Acute hypoxic respiratory failure: Secondary to COPD/flu.  Not on oxygen at home. Now weaned to room air  Influenza A: On Tamiflu.  Supportive care with antibiotics, antitussives.  Hypertension: Currently blood pressure stable.  Continue bisoprolol  Right lung adenocarcinoma: Status post radiation therapy.  Follows with radiation oncology as an outpatient  Hyperlipidemia: On statin  Diabetes type 2: Currently on sliding scale insulin.  Monitor blood sugars.   Nutrition Problem: Moderate Malnutrition Etiology: chronic illness (lung cancer)    DVT prophylaxis:enoxaparin (LOVENOX) injection 40 mg Start: 12/11/22 1200     Code Status: Full Code  Family Communication: Husband at bedside  Patient  status:Inpatient  Patient is from :Home  Anticipated discharge EN:IDPO  Estimated DC date:tomorrow   Consultants: None  Procedures:None  Antimicrobials:  Anti-infectives (From admission, onward)    Start     Dose/Rate Route Frequency Ordered Stop   12/12/22 1000  azithromycin (ZITHROMAX) tablet 500 mg        500 mg Oral Daily 12/12/22 0748 12/15/22 0959   12/11/22 1130  oseltamivir (TAMIFLU) capsule 75 mg        75 mg Oral 2 times daily 12/11/22 1100 12/16/22 0959   12/11/22 1030  oseltamivir (TAMIFLU) capsule 75 mg  Status:  Discontinued        75 mg Oral  Once 12/11/22 1027 12/11/22 1100       Subjective: Patient seen and examined at the bedside today.  She is having more wheezing today though she is on room air.  Continues to cough.  We discussed about this monitor her 1 more night before plan for discharge tomorrow if she improves  Objective: Vitals:   12/12/22 2200 12/13/22 0441 12/13/22 0916 12/13/22 0920  BP:  (!) 159/70    Pulse:  74    Resp:  (!) 24    Temp: 97.9 F (36.6 C) 97.9 F (36.6 C)    TempSrc: Oral Oral    SpO2:  95% 95% 95%  Weight:      Height:        Intake/Output Summary (Last 24 hours) at 12/13/2022 1227 Last data filed at 12/13/2022 0409 Gross per 24 hour  Intake 892.73 ml  Output --  Net 892.73 ml   Filed Weights   12/12/22 1001  Weight: 66 kg    Examination:  General exam: Overall comfortable, not in distress HEENT: PERRL  Respiratory system: Bilateral expiratory wheezing, diminished breath sounds Cardiovascular system: S1 & S2 heard, RRR.  Gastrointestinal system: Abdomen is nondistended, soft and nontender. Central nervous system: Alert and oriented Extremities: No edema, no clubbing ,no cyanosis Skin: No rashes, no ulcers,no icterus  s     Data Reviewed: I have personally reviewed following labs and imaging studies  CBC: Recent Labs  Lab 12/11/22 1031 12/12/22 0409  WBC 5.7 5.6  NEUTROABS 4.4  --   HGB 13.3  13.0  HCT 39.8 40.0  MCV 90.5 92.0  PLT 277 242   Basic Metabolic Panel: Recent Labs  Lab 12/11/22 1031 12/11/22 1920 12/12/22 0409  NA 138  --  141  K 2.7*  --  4.7  CL 96*  --  107  CO2 29  --  27  GLUCOSE 184*  --  119*  BUN 9  --  9  CREATININE 0.85  --  0.67  CALCIUM 9.0  --  9.3  MG  --  2.4  --      Recent Results (from the past 240 hour(s))  Resp panel by RT-PCR (RSV, Flu A&B, Covid) Anterior Nasal Swab     Status: Abnormal   Collection Time: 12/11/22  7:55 AM   Specimen: Anterior Nasal Swab  Result Value Ref Range Status   SARS Coronavirus 2 by RT PCR NEGATIVE NEGATIVE Final    Comment: (NOTE) SARS-CoV-2 target nucleic acids are NOT DETECTED.  The SARS-CoV-2 RNA is generally detectable in upper respiratory specimens during the acute phase of infection. The lowest concentration of SARS-CoV-2 viral copies this assay can detect is 138 copies/mL. A negative result does not preclude SARS-Cov-2 infection and should not be used as the sole basis for treatment or other patient management decisions. A negative result may occur with  improper specimen collection/handling, submission of specimen other than nasopharyngeal swab, presence of viral mutation(s) within the areas targeted by this assay, and inadequate number of viral copies(<138 copies/mL). A negative result must be combined with clinical observations, patient history, and epidemiological information. The expected result is Negative.  Fact Sheet for Patients:  EntrepreneurPulse.com.au  Fact Sheet for Healthcare Providers:  IncredibleEmployment.be  This test is no t yet approved or cleared by the [place] FDA and  has been authorized for detection and/or diagnosis of SARS-CoV-2 by FDA under an Emergency Use Authorization (EUA). This EUA will remain  in effect (meaning this test can be used) for the duration of the COVID-19 declaration under Section 564(b)(1) of the  Act, 21 U.S.C.section 360bbb-3(b)(1), unless the authorization is terminated  or revoked sooner.       Influenza A by PCR POSITIVE (A) NEGATIVE Final   Influenza B by PCR NEGATIVE NEGATIVE Final    Comment: (NOTE) The Xpert Xpress SARS-CoV-2/FLU/RSV plus assay is intended as an aid in the diagnosis of influenza from Nasopharyngeal swab specimens and should not be used as a sole basis for treatment. Nasal washings and aspirates are unacceptable for Xpert Xpress SARS-CoV-2/FLU/RSV testing.  Fact Sheet for Patients: EntrepreneurPulse.com.au  Fact Sheet for Healthcare Providers: IncredibleEmployment.be  This test is not yet approved or cleared by the [place] FDA and has been authorized for detection and/or diagnosis of SARS-CoV-2 by FDA under an Emergency Use Authorization (EUA). This EUA will remain in effect (meaning this test can be used) for the duration of the COVID-19 declaration under Section 564(b)(1) of the Act, 21 U.S.C. section 360bbb-3(b)(1), unless the authorization is terminated or revoked.  Resp Syncytial Virus by PCR NEGATIVE NEGATIVE Final    Comment: (NOTE) Fact Sheet for Patients: EntrepreneurPulse.com.au  Fact Sheet for Healthcare Providers: IncredibleEmployment.be  This test is not yet approved or cleared by the [place] FDA and has been authorized for detection and/or diagnosis of SARS-CoV-2 by FDA under an Emergency Use Authorization (EUA). This EUA will remain in effect (meaning this test can be used) for the duration of the COVID-19 declaration under Section 564(b)(1) of the Act, 21 U.S.C. section 360bbb-3(b)(1), unless the authorization is terminated or revoked.  Performed at [facility], [address] [place], [place] [address]      Radiology Studies: No results found.  Scheduled Meds:  aspirin  81 mg Oral Daily   atorvastatin  80 mg  Oral Once per day on Mon Tue Wed Thu Fri   azithromycin  500 mg Oral Daily   bisoprolol  2.5 mg Oral Daily   chlorpheniramine-HYDROcodone  5 mL Oral Q12H   docusate sodium  100 mg Oral BID   DULoxetine  60 mg Oral Daily   enoxaparin (LOVENOX) injection  40 mg Subcutaneous Q24H   feeding supplement  237 mL Oral BID BM   fluticasone furoate-vilanterol  1 puff Inhalation Daily   And   umeclidinium bromide  1 puff Inhalation Daily   guaiFENesin  600 mg Oral BID   hydrocortisone cream   Topical TID   insulin aspart  0-15 Units Subcutaneous TID WC   ipratropium-albuterol  3 mL Nebulization TID   magnesium oxide  200 mg Oral Daily   methylPREDNISolone (SOLU-MEDROL) injection  120 mg Intravenous Q24H   montelukast  10 mg Oral QHS   oseltamivir  75 mg Oral BID   pantoprazole  40 mg Oral QAC breakfast   pregabalin  100 mg Oral TID   sodium chloride flush  3 mL Intravenous Q12H   Continuous Infusions:     LOS: 2 days   Shelly Coss, MD [facility] P12/15/2023, 12:27 PM

## 2022-12-13 NOTE — Progress Notes (Signed)
PIV consult: Discussed with pt and Esther, RN: pt is expecting to be discharged today. Will await MD rounds to decide on placing new PIV. RN to enter new consult if needed.

## 2022-12-14 DIAGNOSIS — E440 Moderate protein-calorie malnutrition: Secondary | ICD-10-CM | POA: Insufficient documentation

## 2022-12-14 DIAGNOSIS — J441 Chronic obstructive pulmonary disease with (acute) exacerbation: Secondary | ICD-10-CM | POA: Diagnosis not present

## 2022-12-14 LAB — CBC
HCT: 39.3 % (ref 36.0–46.0)
Hemoglobin: 13 g/dL (ref 12.0–15.0)
MCH: 29.9 pg (ref 26.0–34.0)
MCHC: 33.1 g/dL (ref 30.0–36.0)
MCV: 90.3 fL (ref 80.0–100.0)
Platelets: 328 10*3/uL (ref 150–400)
RBC: 4.35 MIL/uL (ref 3.87–5.11)
RDW: 14 % (ref 11.5–15.5)
WBC: 9.2 10*3/uL (ref 4.0–10.5)
nRBC: 0 % (ref 0.0–0.2)

## 2022-12-14 LAB — BASIC METABOLIC PANEL
Anion gap: 8 (ref 5–15)
BUN: 18 mg/dL (ref 8–23)
CO2: 30 mmol/L (ref 22–32)
Calcium: 9 mg/dL (ref 8.9–10.3)
Chloride: 101 mmol/L (ref 98–111)
Creatinine, Ser: 0.76 mg/dL (ref 0.44–1.00)
GFR, Estimated: >60 mL/min (ref >60)
Glucose, Bld: 85 mg/dL (ref 70–99)
Potassium: 4.2 mmol/L (ref 3.5–5.1)
Sodium: 139 mmol/L (ref 135–145)

## 2022-12-14 LAB — GLUCOSE, CAPILLARY: Glucose-Capillary: 79 mg/dL (ref 70–99)

## 2022-12-14 MED ORDER — PREDNISONE 10 MG PO TABS: 10.0000 mg | ORAL_TABLET | Freq: Every day | ORAL | 0 refills | Status: DC

## 2022-12-14 MED ORDER — GUAIFENESIN ER 600 MG PO TB12: 600.0000 mg | ORAL_TABLET | Freq: Two times a day (BID) | ORAL | 0 refills | Status: AC

## 2022-12-14 MED ORDER — OSELTAMIVIR PHOSPHATE 75 MG PO CAPS: 75.0000 mg | ORAL_CAPSULE | Freq: Two times a day (BID) | ORAL | 0 refills | Status: AC

## 2022-12-14 NOTE — Plan of Care (Signed)
  Problem: Education: Goal: Knowledge of General Education information will improve Description: Including pain rating scale, medication(s)/side effects and non-pharmacologic comfort measures Outcome: Completed/Met   Problem: Health Behavior/Discharge Planning: Goal: Ability to manage health-related needs will improve Outcome: Completed/Met   Problem: Clinical Measurements: Goal: Ability to maintain clinical measurements within normal limits will improve Outcome: Completed/Met Goal: Will remain free from infection Outcome: Completed/Met Goal: Diagnostic test results will improve Outcome: Completed/Met Goal: Respiratory complications will improve Outcome: Completed/Met Goal: Cardiovascular complication will be avoided Outcome: Completed/Met   Problem: Activity: Goal: Risk for activity intolerance will decrease Outcome: Completed/Met   Problem: Nutrition: Goal: Adequate nutrition will be maintained Outcome: Completed/Met   Problem: Coping: Goal: Level of anxiety will decrease Outcome: Completed/Met   Problem: Elimination: Goal: Will not experience complications related to bowel motility Outcome: Completed/Met Goal: Will not experience complications related to urinary retention Outcome: Completed/Met   Problem: Pain Managment: Goal: General experience of comfort will improve Outcome: Completed/Met   Problem: Safety: Goal: Ability to remain free from injury will improve Outcome: Completed/Met   Problem: Skin Integrity: Goal: Risk for impaired skin integrity will decrease Outcome: Completed/Met   Problem: Education: Goal: Knowledge of disease or condition will improve Outcome: Completed/Met Goal: Knowledge of the prescribed therapeutic regimen will improve Outcome: Completed/Met Goal: Individualized Educational Video(s) Outcome: Completed/Met   Problem: Activity: Goal: Ability to tolerate increased activity will improve Outcome: Completed/Met Goal: Will  verbalize the importance of balancing activity with adequate rest periods Outcome: Completed/Met   Problem: Respiratory: Goal: Ability to maintain a clear airway will improve Outcome: Completed/Met Goal: Levels of oxygenation will improve Outcome: Completed/Met Goal: Ability to maintain adequate ventilation will improve Outcome: Completed/Met

## 2022-12-14 NOTE — Plan of Care (Signed)

## 2022-12-14 NOTE — Discharge Summary (Signed)
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 On presentation, she was hypoxic requiring 2 L of oxygen. Lab work showed potassium 2.7. Influenza PCR positive. Chest x-ray showed treatment changes in the right upper lobe for non-small cell lung cancer. Patient was started on IV steroids, Tamiflu.  She was initially requiring oxygen, now has been weaned to room air.  She still has some mild bilateral expiratory wheezing but feels comfortable to go home today.  She will be discharged on tapering dose of prednisone, we have recommended her to follow-up with her pulmonologist as an outpatient.  Following problems were addressed during her hospitalization:  Acute COPD exacerbation: Presented with exertional dyspnea, wheezing, yellow sputum.  COPD exacerbation secondary to influenza.  Continue bronchodilators, steroids.  Continue cough medications.  On Singulair. Completed the course of  azithromycin.   Acute hypoxic respiratory failure: Secondary to COPD/flu.  Not on oxygen at home. Now weaned to room air   Influenza A: On Tamiflu.   Hypertension: Currently blood pressure stable.  Continue bisoprolol   Right lung adenocarcinoma: Status post radiation therapy.  Follows with radiation oncology as an outpatient   Hyperlipidemia: On statin     Discharge Diagnoses:  Principal Problem:   COPD with acute exacerbation ([place]) Active Problems:   Influenza A   Essential hypertension    Adenocarcinoma, lung, right ([facility])   Dyslipidemia associated with type 2 diabetes mellitus ([facility])   Chronic pain   Malnutrition of moderate degree    Discharge Instructions  Discharge Instructions     Diet general   Complete by: As directed    Discharge instructions   Complete by: As directed    1)Please take prescribed medications as instructed. 2)Follow up with your PCP and pulmonologist as an outpatient.   Increase activity slowly   Complete by: As directed       Allergies as of 12/14/2022       Reactions   Other Swelling   HAIR DYE        Medication List     STOP taking these medications    amoxicillin-clavulanate 875-125 MG tablet Commonly known as: AUGMENTIN   azithromycin 250 MG tablet Commonly known as: ZITHROMAX       TAKE these medications    Accu-Chek Guide test strip Generic drug: glucose blood 1 each by Other route daily. Use as instructed to check blood sugars 1 time per day dx: e11.22   albuterol 108 (90 Base) MCG/ACT inhaler Commonly known as: VENTOLIN HFA INHALE 2 PUFFS INTO THE LUNGS EVERY 4 HOURS AS NEEDED FOR WHEEZING OR SHORTNESS OF BREATH   aspirin 81 MG tablet Take 81 mg by mouth daily.   atorvastatin 80 MG tablet Commonly known as: Lipitor Take one tab po M-F and skip the weekends   Biotin 10000 MCG Tbdp Take 10,000 mcg by mouth daily.   bisoprolol 5 MG tablet Commonly known as: ZEBETA TAKE 1/2 TABLET BY MOUTH EVERY DAY   Breztri Aerosphere 160-9-4.8 MCG/ACT Aero Generic drug: Budeson-Glycopyrrol-Formoterol Inhale 2 puffs into the lungs 2 (two) times daily.   CALCIUM-MAGNESIUM-ZINC PO  Take 1 capsule by mouth at bedtime.   DULoxetine 60 MG capsule Commonly known as: Cymbalta Take 1 capsule (60 mg total) by mouth daily.   famotidine 20 MG tablet Commonly known as: PEPCID One after supper   guaiFENesin 600 MG 12 hr tablet Commonly known as: Mucinex Take 1 tablet (600 mg total) by mouth 2 (two) times daily for 7  days.   ipratropium-albuterol 0.5-2.5 (3) MG/3ML Soln Commonly known as: DUONEB USE 3 ML VIA NEBULIZER EVERY 6 HOURS AS NEEDED   Magnesium 250 MG Tabs Take 1 tablet by mouth daily.   montelukast 10 MG tablet Commonly known as: SINGULAIR TAKE 1 TABLET(10 MG) BY MOUTH AT BEDTIME   oseltamivir 75 MG capsule Commonly known as: TAMIFLU Take 1 capsule (75 mg total) by mouth 2 (two) times daily for 2 days.   oxyCODONE-acetaminophen 10-325 MG tablet Commonly known as: PERCOCET Take 1 tablet by mouth 4 (four) times daily as needed.   Ozempic (1 MG/DOSE) 4 MG/3ML Sopn Generic drug: Semaglutide (1 MG/DOSE) Inject 1 mg into the skin once a week. Friday   pantoprazole 40 MG tablet Commonly known as: PROTONIX TAKE 1 TABLET BY MOUTH 30 MINUTES BEFORE FIRST MEAL OF THE DAY   predniSONE 10 MG tablet Commonly known as: DELTASONE Take 1 tablet (10 mg total) by mouth daily. Take 4 pills daily for 3 days then 3 pills daily for 3 days then 2 pills daily for 3 days then 1 pill daily for 3 days then stop   pregabalin 100 MG capsule Commonly known as: LYRICA Take 100 mg by mouth 3 (three) times daily.   Vitamin D3 50 MCG (2000 UT) Tabs Take 2,000 Units by mouth at bedtime.        Follow-up Information     [name], MD. Schedule an appointment as soon as possible for a visit in 1 week(s).   Specialty: Internal Medicine Contact information: [address] [place] [address] [phone]                Allergies  Allergen Reactions   Other Swelling    HAIR DYE    Consultations: None   Procedures/Studies: DG Chest Portable 1 View  Result Date: 12/11/2022 CLINICAL DATA:  Cough, congestion and trouble breathing for 4 days. EXAM: PORTABLE CHEST 1 VIEW COMPARISON:  CT 09/30/2022.  Radiographs 05/01/2022 and 03/29/2022. FINDINGS: 0833 hours. The heart size and mediastinal contours are stable. Right suprahilar fiducial marker and adjacent scarring are  partly obscured by overlying support apparatus but appear similar to previous CT in this patient with a history of treated non-small cell lung cancer. Left basilar pleuroparenchymal scarring appears stable. No definite superimposed airspace disease, pneumothorax or significant pleural effusion. The bones appear unchanged. IMPRESSION: Treatment changes in the right upper lobe for non-small cell lung cancer, similar to CT of 2 months ago. No definite acute superimposed process. Electronically Signed   By: Richardean Sale M.D.   On: 12/11/2022 08:49      Subjective:  Patient seen and examined at bedside today.  Hemodynamically stable for discharge.  On room air.  Wants to go home.   Discharge Exam: Vitals:   12/14/22 0625 12/14/22 0800  BP: (!) 156/78   Pulse: 77   Resp: 18 20  Temp: 98.2 F (36.8 C)   SpO2: 100%    Vitals:   12/13/22 1742 12/13/22 2319 12/14/22 0625 12/14/22 0800  BP:  (!) 152/76 (!) 156/78   Pulse:  75  77   Resp:  20 18 20   Temp:  98.5 F (36.9 C) 98.2 F (36.8 C)   TempSrc:  Oral Oral   SpO2: 97% 95% 100%   Weight:      Height:        General: Pt is alert, awake, not in acute distress Cardiovascular: RRR, S1/S2 +, no rubs, no gallops Respiratory: CTA bilaterally, no wheezing, no rhonchi Abdominal: Soft, NT, ND, bowel sounds + Extremities: no edema, no cyanosis    The results of significant diagnostics from this hospitalization (including imaging, microbiology, ancillary and laboratory) are listed below for reference.     Microbiology: Recent Results (from the past 240 hour(s))  Resp panel by RT-PCR (RSV, Flu A&B, Covid) Anterior Nasal Swab     Status: Abnormal   Collection Time: 12/11/22  7:55 AM   Specimen: Anterior Nasal Swab  Result Value Ref Range Status   SARS Coronavirus 2 by RT PCR NEGATIVE NEGATIVE Final    Comment: (NOTE) SARS-CoV-2 target nucleic acids are NOT DETECTED.  The SARS-CoV-2 RNA is generally detectable in upper  respiratory specimens during the acute phase of infection. The lowest concentration of SARS-CoV-2 viral copies this assay can detect is 138 copies/mL. A negative result does not preclude SARS-Cov-2 infection and should not be used as the sole basis for treatment or other patient management decisions. A negative result may occur with  improper specimen collection/handling, submission of specimen other than nasopharyngeal swab, presence of viral mutation(s) within the areas targeted by this assay, and inadequate number of viral copies(<138 copies/mL). A negative result must be combined with clinical observations, patient history, and epidemiological information. The expected result is Negative.  Fact Sheet for Patients:  EntrepreneurPulse.com.au  Fact Sheet for Healthcare Providers:  IncredibleEmployment.be  This test is no t yet approved or cleared by the [place] FDA and  has been authorized for detection and/or diagnosis of SARS-CoV-2 by FDA under an Emergency Use Authorization (EUA). This EUA will remain  in effect (meaning this test can be used) for the duration of the COVID-19 declaration under Section 564(b)(1) of the Act, 21 U.S.C.section 360bbb-3(b)(1), unless the authorization is terminated  or revoked sooner.       Influenza A by PCR POSITIVE (A) NEGATIVE Final   Influenza B by PCR NEGATIVE NEGATIVE Final    Comment: (NOTE) The Xpert Xpress SARS-CoV-2/FLU/RSV plus assay is intended as an aid in the diagnosis of influenza from Nasopharyngeal swab specimens and should not be used as a sole basis for treatment. Nasal washings and aspirates are unacceptable for Xpert Xpress SARS-CoV-2/FLU/RSV testing.  Fact Sheet for Patients: EntrepreneurPulse.com.au  Fact Sheet for Healthcare Providers: IncredibleEmployment.be  This test is not yet approved or cleared by the [place] FDA and has been  authorized for detection and/or diagnosis of SARS-CoV-2 by FDA under an Emergency Use Authorization (EUA). This EUA will remain in effect (meaning this test can be used) for the duration of the COVID-19 declaration under Section 564(b)(1) of the Act, 21 U.S.C. section 360bbb-3(b)(1), unless the authorization is terminated or revoked.     Resp Syncytial Virus by PCR NEGATIVE NEGATIVE Final    Comment: (NOTE) Fact Sheet for Patients: EntrepreneurPulse.com.au  Fact Sheet for Healthcare Providers: IncredibleEmployment.be  This test is not yet approved or cleared by the [place] FDA and has been authorized for detection and/or diagnosis of SARS-CoV-2 by FDA under an Emergency Use Authorization (EUA). This EUA will remain in effect (meaning this test can be used)  for the duration of the COVID-19 declaration under Section 564(b)(1) of the Act, 21 U.S.C. section 360bbb-3(b)(1), unless the authorization is terminated or revoked.  Performed at [facility], [address], [place], [place] [address]      Labs: BNP (last 3 results) No results for input(s): "BNP" in the last 8760 hours. Basic Metabolic Panel: Recent Labs  Lab 12/11/22 1031 12/11/22 1920 12/12/22 0409 12/14/22 0453  NA 138  --  141 139  K 2.7*  --  4.7 4.2  CL 96*  --  107 101  CO2 29  --  27 30  GLUCOSE 184*  --  119* 85  BUN 9  --  9 18  CREATININE 0.85  --  0.67 0.76  CALCIUM 9.0  --  9.3 9.0  MG  --  2.4  --   --    Liver Function Tests: No results for input(s): "AST", "ALT", "ALKPHOS", "BILITOT", "PROT", "ALBUMIN" in the last 168 hours. No results for input(s): "LIPASE", "AMYLASE" in the last 168 hours. No results for input(s): "AMMONIA" in the last 168 hours. CBC: Recent Labs  Lab 12/11/22 1031 12/12/22 0409 12/14/22 0453  WBC 5.7 5.6 9.2  NEUTROABS 4.4  --   --   HGB 13.3 13.0 13.0  HCT 39.8 40.0 39.3  MCV 90.5 92.0 90.3  PLT 277 300  328   Cardiac Enzymes: No results for input(s): "CKTOTAL", "CKMB", "CKMBINDEX", "TROPONINI" in the last 168 hours. BNP: Invalid input(s): "POCBNP" CBG: Recent Labs  Lab 12/13/22 0739 12/13/22 1113 12/13/22 1724 12/13/22 2323 12/14/22 0844  GLUCAP 94 108* 142* 107* 79   D-Dimer No results for input(s): "DDIMER" in the last 72 hours. Hgb A1c No results for input(s): "HGBA1C" in the last 72 hours. Lipid Profile No results for input(s): "CHOL", "HDL", "LDLCALC", "TRIG", "CHOLHDL", "LDLDIRECT" in the last 72 hours. Thyroid function studies No results for input(s): "TSH", "T4TOTAL", "T3FREE", "THYROIDAB" in the last 72 hours.  Invalid input(s): "FREET3" Anemia work up No results for input(s): "VITAMINB12", "FOLATE", "FERRITIN", "TIBC", "IRON", "RETICCTPCT" in the last 72 hours. Urinalysis    Component Value Date/Time   COLORURINE STRAW (A) 09/10/2017 0844   APPEARANCEUR CLEAR 09/10/2017 0844   LABSPEC 1.006 09/10/2017 0844   PHURINE 5.0 09/10/2017 0844   GLUCOSEU 50 (A) 09/10/2017 0844   HGBUR NEGATIVE 09/10/2017 0844   BILIRUBINUR negative 12/05/2021 1703   KETONESUR 5 (A) 09/10/2017 0844   PROTEINUR Negative 12/05/2021 1703   PROTEINUR NEGATIVE 09/10/2017 0844   UROBILINOGEN 0.2 12/05/2021 1703   NITRITE negative 12/05/2021 1703   NITRITE NEGATIVE 09/10/2017 0844   LEUKOCYTESUR Negative 12/05/2021 1703   Sepsis Labs Recent Labs  Lab 12/11/22 1031 12/12/22 0409 12/14/22 0453  WBC 5.7 5.6 9.2   Microbiology Recent Results (from the past 240 hour(s))  Resp panel by RT-PCR (RSV, Flu A&B, Covid) Anterior Nasal Swab     Status: Abnormal   Collection Time: 12/11/22  7:55 AM   Specimen: Anterior Nasal Swab  Result Value Ref Range Status   SARS Coronavirus 2 by RT PCR NEGATIVE NEGATIVE Final    Comment: (NOTE) SARS-CoV-2 target nucleic acids are NOT DETECTED.  The SARS-CoV-2 RNA is generally detectable in upper respiratory specimens during the acute phase of  infection. The lowest concentration of SARS-CoV-2 viral copies this assay can detect is 138 copies/mL. A negative result does not preclude SARS-Cov-2 infection and should not be used as the sole basis for treatment or other patient management decisions. A negative  result may occur with  improper specimen collection/handling, submission of specimen other than nasopharyngeal swab, presence of viral mutation(s) within the areas targeted by this assay, and inadequate number of viral copies(<138 copies/mL). A negative result must be combined with clinical observations, patient history, and epidemiological information. The expected result is Negative.  Fact Sheet for Patients:  EntrepreneurPulse.com.au  Fact Sheet for Healthcare Providers:  IncredibleEmployment.be  This test is no t yet approved or cleared by the [place] FDA and  has been authorized for detection and/or diagnosis of SARS-CoV-2 by FDA under an Emergency Use Authorization (EUA). This EUA will remain  in effect (meaning this test can be used) for the duration of the COVID-19 declaration under Section 564(b)(1) of the Act, 21 U.S.C.section 360bbb-3(b)(1), unless the authorization is terminated  or revoked sooner.       Influenza A by PCR POSITIVE (A) NEGATIVE Final   Influenza B by PCR NEGATIVE NEGATIVE Final    Comment: (NOTE) The Xpert Xpress SARS-CoV-2/FLU/RSV plus assay is intended as an aid in the diagnosis of influenza from Nasopharyngeal swab specimens and should not be used as a sole basis for treatment. Nasal washings and aspirates are unacceptable for Xpert Xpress SARS-CoV-2/FLU/RSV testing.  Fact Sheet for Patients: EntrepreneurPulse.com.au  Fact Sheet for Healthcare Providers: IncredibleEmployment.be  This test is not yet approved or cleared by the [place] FDA and has been authorized for detection and/or diagnosis of  SARS-CoV-2 by FDA under an Emergency Use Authorization (EUA). This EUA will remain in effect (meaning this test can be used) for the duration of the COVID-19 declaration under Section 564(b)(1) of the Act, 21 U.S.C. section 360bbb-3(b)(1), unless the authorization is terminated or revoked.     Resp Syncytial Virus by PCR NEGATIVE NEGATIVE Final    Comment: (NOTE) Fact Sheet for Patients: EntrepreneurPulse.com.au  Fact Sheet for Healthcare Providers: IncredibleEmployment.be  This test is not yet approved or cleared by the [place] FDA and has been authorized for detection and/or diagnosis of SARS-CoV-2 by FDA under an Emergency Use Authorization (EUA). This EUA will remain in effect (meaning this test can be used) for the duration of the COVID-19 declaration under Section 564(b)(1) of the Act, 21 U.S.C. section 360bbb-3(b)(1), unless the authorization is terminated or revoked.  Performed at [facility], [address] [place], [place] [address]     Please note: You were cared for by a hospitalist during your hospital stay. Once you are discharged, your primary care physician will handle any further medical issues. Please note that NO REFILLS for any discharge medications will be authorized once you are discharged, as it is imperative that you return to your primary care physician (or establish a relationship with a primary care physician if you do not have one) for your post hospital discharge needs so that they can reassess your need for medications and monitor your lab values.    Time coordinating discharge: 40 minutes  SIGNED:   Shelly Coss, MD  [facility] 12/14/2022, 10:42 AM Pager [phone]  If 7PM-7AM, please contact night-coverage [email or web address] Password [email or web address]

## 2023-01-01 ENCOUNTER — Encounter: Payer: Medicare Other | Admitting: Internal Medicine

## 2023-01-01 DIAGNOSIS — H903 Sensorineural hearing loss, bilateral: Secondary | ICD-10-CM | POA: Diagnosis not present

## 2023-01-02 ENCOUNTER — Encounter: Payer: Self-pay | Admitting: Internal Medicine

## 2023-01-13 ENCOUNTER — Encounter: Payer: Self-pay | Admitting: Internal Medicine

## 2023-01-18 ENCOUNTER — Other Ambulatory Visit: Payer: Self-pay | Admitting: Internal Medicine

## 2023-01-18 DIAGNOSIS — I119 Hypertensive heart disease without heart failure: Secondary | ICD-10-CM

## 2023-01-18 DIAGNOSIS — E1169 Type 2 diabetes mellitus with other specified complication: Secondary | ICD-10-CM

## 2023-01-18 DIAGNOSIS — E7800 Pure hypercholesterolemia, unspecified: Secondary | ICD-10-CM

## 2023-01-22 DIAGNOSIS — Z86010 Personal history of colonic polyps: Secondary | ICD-10-CM | POA: Diagnosis not present

## 2023-01-22 DIAGNOSIS — Z8719 Personal history of other diseases of the digestive system: Secondary | ICD-10-CM | POA: Diagnosis not present

## 2023-01-22 DIAGNOSIS — K648 Other hemorrhoids: Secondary | ICD-10-CM | POA: Diagnosis not present

## 2023-01-25 ENCOUNTER — Other Ambulatory Visit: Payer: Self-pay | Admitting: Internal Medicine

## 2023-01-28 ENCOUNTER — Telehealth: Payer: Self-pay

## 2023-01-28 NOTE — Progress Notes (Signed)
  Chronic Care Management   Note  01/28/2023 Name: [name] MRN: [id number] DOB: March 29, 1946  [name] is a 78 y.o. year old female who is a primary care patient of [name], MD. I reached out to [employer] by phone today in response to a referral sent by [name] PCP.  [name] was given information about Chronic Care Management services today including:  CCM service includes personalized support from designated clinical staff supervised by the physician, including individualized plan of care and coordination with other care providers 24/7 contact phone numbers for assistance for urgent and routine care needs. Service will only be billed when office clinical staff spend 20 minutes or more in a month to coordinate care. Only one practitioner may furnish and bill the service in a calendar month. The patient may stop CCM services at amy time (effective at the end of the month) by phone call to the office staff. The patient will be responsible for cost sharing (co-pay) or up to 20% of the service fee (after annual deductible is met)  [name]  agreedto scheduling an appointment with the CCM RN Case Manager   Follow up plan: Patient agreed to scheduled appointment with RN Case Manager on 02/07/2023(date/time).   [name], [place], [place] [address] Direct Dial: [phone] [name]

## 2023-01-28 NOTE — Progress Notes (Signed)
   Care Guide Note  01/28/2023 Name: [name] MRN: [id number] DOB: Feb 18, 1946  Referred by: [name], MD Reason for referral : No chief complaint on file.   [name] is a 77 y.o. year old female who is a primary care patient of [name], MD. Hal Neer Degner was referred to the pharmacist for assistance related to DM.    Successful contact was made with the patient to discuss pharmacy services including being ready for the pharmacist to call at least 5 minutes before the scheduled appointment time, to have medication bottles and any blood sugar or blood pressure readings ready for review. The patient agreed to meet with the pharmacist via with the pharmacist via telephone visit on (date/time).  03/21/2023  Noreene Larsson, [place], [place] [address] Direct Dial: [phone] [email or web address]

## 2023-01-31 ENCOUNTER — Telehealth: Payer: Self-pay

## 2023-01-31 NOTE — Telephone Encounter (Signed)
Patient picked up 4 boxes of Ozempic 1 mg on 01/23/2023. Per [name]

## 2023-02-07 ENCOUNTER — Telehealth: Payer: Medicare PPO

## 2023-02-07 ENCOUNTER — Ambulatory Visit (INDEPENDENT_AMBULATORY_CARE_PROVIDER_SITE_OTHER): Payer: Medicare HMO

## 2023-02-07 DIAGNOSIS — E1165 Type 2 diabetes mellitus with hyperglycemia: Secondary | ICD-10-CM

## 2023-02-07 DIAGNOSIS — I119 Hypertensive heart disease without heart failure: Secondary | ICD-10-CM

## 2023-02-07 NOTE — Patient Instructions (Addendum)
Please call the care guide team at [phone] if you need to cancel or reschedule your appointment.   If you are experiencing a Mental Health or [place] or need someone to talk to, please call the Suicide and Crisis Lifeline: 988 call the [place] National Suicide Prevention Lifeline: 6600168147 or TTY: 613-490-6224 TTY 682-217-9294) to talk to a trained counselor call 1-800-273-TALK (toll free, 24 hour hotline) go to [facility] [place] [phone])   Following is a copy of the CCM Program Consent:  CCM service includes personalized support from designated clinical staff supervised by the physician, including individualized plan of care and coordination with other care providers 24/7 contact phone numbers for assistance for urgent and routine care needs. Service will only be billed when office clinical staff spend 20 minutes or more in a month to coordinate care. Only one practitioner may furnish and bill the service in a calendar month. The patient may stop CCM services at amy time (effective at the end of the month) by phone call to the office staff. The patient will be responsible for cost sharing (co-pay) or up to 20% of the service fee (after annual deductible is met)  Following is a copy of your full provider care plan:   Goals Addressed             This Visit's Progress    CCM Expected Outcome:  Monitor, Self-Manage and Reduce Symptoms of Diabetes       Current Barriers:  Knowledge Deficits related to the importance of checking blood sugars on a regular basis and the benefits of following a heart healthy/ADA diet Care Coordination needs related to pharm D support with effective medication management  in a patient with DM Chronic Disease Management support and education needs related to effective management of DM  Planned Interventions: Provided education to patient about basic DM disease  process; Reviewed medications with patient and discussed importance of medication adherence;        Reviewed prescribed diet with patient heart healthy/ADA diet. Will send information through my Chart for information on eating healthy and following dietary restrictions; Counseled on importance of regular laboratory monitoring as prescribed;        Discussed plans with patient for ongoing care management follow up and provided patient with direct contact information for care management team;      Provided patient with written educational materials related to hypo and hyperglycemia and importance of correct treatment. Education provided on the sx and sx of hypo and hyperglycemia. Will send information by my Chart for education and ongoing support of effective management of DM;       Reviewed scheduled/upcoming provider appointments including: no upcoming appointments with pcp, reminder provided to call for needs or changes. Has upcoming appointment with the pharm D on 03-21-2023- reminder given today;         Advised patient, providing education and rationale, to check cbg once daily, when you have symptoms of low or high blood sugar, and encouraged the patient to take blood sugars on a regular basis for baseline status  and record        call provider for findings outside established parameters;       Referral made to pharmacy team for assistance with medications management and procurement. Has pharm D appointment on 03-21-2023;       Review of patient status, including review of consultants reports, relevant laboratory and other test results, and medications completed;  Advised patient to discuss changes in DM, questions or concerns with provider;      Screening for signs and symptoms of depression related to chronic disease state;        Assessed social determinant of health barriers;         Symptom Management: Take medications as prescribed   Attend all scheduled provider appointments Call  provider office for new concerns or questions  call the Suicide and Crisis Lifeline: 988 call the [place] National Suicide Prevention Lifeline: (872)048-2577 or TTY: (414)293-4276 TTY (321) 269-4377) to talk to a trained counselor call 1-800-273-TALK (toll free, 24 hour hotline) go to [facility] [address] [place] [phone]) if experiencing a Mental Health or [place]  keep appointment with eye doctor check blood sugar at prescribed times: once daily and when you have symptoms of low or high blood sugar check feet daily for cuts, sores or redness trim toenails straight across wash and dry feet carefully every day wear comfortable, cotton socks wear comfortable, well-fitting shoes  Follow Up Plan: Telephone follow up appointment with care management team member scheduled for: 04-04-2023 at 28 am       CCM Expected Outcome:  Monitor, Self-Manage, and Reduce Symptoms of Hypertension       Current Barriers:  Chronic Disease Management support and education needs related to effective management of HTN  Planned Interventions: Evaluation of current treatment plan related to hypertension self management and patient's adherence to plan as established by provider;   Provided education to patient re: stroke prevention, s/s of heart attack and stroke; Reviewed prescribed diet heart healthy/ADA diet Reviewed medications with patient and discussed importance of compliance;  Discussed plans with patient for ongoing care management follow up and provided patient with direct contact information for care management team; Advised patient, providing education and rationale, to monitor blood pressure daily and record, calling PCP for findings outside established parameters;  Reviewed scheduled/upcoming provider appointments including: does not have any upcoming appointments with the pcp. Knows to call for changes. Advised patient to discuss changes  in HTN or heart health with provider; Provided education on prescribed diet heart healthy/ADA diet. Education provided and will send information by myChart ;  Discussed complications of poorly controlled blood pressure such as heart disease, stroke, circulatory complications, vision complications, kidney impairment, sexual dysfunction;  Screening for signs and symptoms of depression related to chronic disease state;  Assessed social determinant of health barriers;   Symptom Management: Take medications as prescribed   Attend all scheduled provider appointments Call provider office for new concerns or questions  call the Suicide and Crisis Lifeline: 988 call the [place] National Suicide Prevention Lifeline: 864 264 5504 or TTY: 774-593-1230 TTY (609) 674-7981) to talk to a trained counselor call 1-800-273-TALK (toll free, 24 hour hotline) if experiencing a Mental Health or [place]  check blood pressure weekly learn about high blood pressure call doctor for signs and symptoms of high blood pressure develop an action plan for high blood pressure keep all doctor appointments take medications for blood pressure exactly as prescribed report new symptoms to your doctor  Follow Up Plan: Telephone follow up appointment with care management team member scheduled for: 04-04-2023 at 1030 am          Patient verbalizes understanding of instructions and care plan provided today and agrees to view in [place]. Active MyChart status and patient understanding of how to access instructions and care plan via MyChart confirmed with patient.  Telephone follow up appointment with care management team member scheduled for: 04-04-2023 at 20 am  Diabetes Mellitus and Foot Care Diabetes, also called diabetes mellitus, may cause problems with your feet and legs because of poor blood flow (circulation). Poor circulation may make your skin: Become thinner and drier. Break more easily. Heal more  slowly. Peel and crack. You may also have nerve damage (neuropathy). This can cause decreased feeling in your legs and feet. This means that you may not notice minor injuries to your feet that could lead to more serious problems. Finding and treating problems early is the best way to prevent future foot problems. How to care for your feet Foot hygiene  Wash your feet daily with warm water and mild soap. Do not use hot water. Then, pat your feet and the areas between your toes until they are fully dry. Do not soak your feet. This can dry your skin. Trim your toenails straight across. Do not dig under them or around the cuticle. File the edges of your nails with an emery board or nail file. Apply a moisturizing lotion or petroleum jelly to the skin on your feet and to dry, brittle toenails. Use lotion that does not contain alcohol and is unscented. Do not apply lotion between your toes. Shoes and socks Wear clean socks or stockings every day. Make sure they are not too tight. Do not wear knee-high stockings. These may decrease blood flow to your legs. Wear shoes that fit well and have enough cushioning. Always look in your shoes before you put them on to be sure there are no objects inside. To break in new shoes, wear them for just a few hours a day. This prevents injuries on your feet. Wounds, scrapes, corns, and calluses  Check your feet daily for blisters, cuts, bruises, sores, and redness. If you cannot see the bottom of your feet, use a mirror or ask someone for help. Do not cut off corns or calluses or try to remove them with medicine. If you find a minor scrape, cut, or break in the skin on your feet, keep it and the skin around it clean and dry. You may clean these areas with mild soap and water. Do not clean the area with peroxide, alcohol, or iodine. If you have a wound, scrape, corn, or callus on your foot, look at it several times a day to make sure it is healing and not infected. Check  for: Redness, swelling, or pain. Fluid or blood. Warmth. Pus or a bad smell. General tips Do not cross your legs. This may decrease blood flow to your feet. Do not use heating pads or hot water bottles on your feet. They may burn your skin. If you have lost feeling in your feet or legs, you may not know this is happening until it is too late. Protect your feet from hot and cold by wearing shoes, such as at the beach or on hot pavement. Schedule a complete foot exam at least once a year or more often if you have foot problems. Report any cuts, sores, or bruises to your health care provider right away. Where to find more information American Diabetes Association: diabetes.org Association of Diabetes Care & Education Specialists: diabeteseducator.org Contact a health care provider if: You have a condition that increases your risk of infection, and you have any cuts, sores, or bruises on your feet. You have an injury that is not healing. You have redness on your legs or feet. You  feel burning or tingling in your legs or feet. You have pain or cramps in your legs and feet. Your legs or feet are numb. Your feet always feel cold. You have pain around any toenails. Get help right away if: You have a wound, scrape, corn, or callus on your foot and: You have signs of infection. You have a fever. You have a red line going up your leg. This information is not intended to replace advice given to you by your health care provider. Make sure you discuss any questions you have with your health care provider. Document Revised: 06/19/2022 Document Reviewed: 06/19/2022 Elsevier Patient Education  [place] [employer] In this video, you will learn how to manage your diabetes when you are sick, including how often to check your blood glucose and urine for ketones. You'll also learn when you should contact your health care provider. To view the content, go to this web  address: [email or web address]  This video will expire on: 09/03/2024. If you need access to this video following this date, please reach out to the healthcare provider who assigned it to you. This information is not intended to replace advice given to you by your health care provider. Make sure you discuss any questions you have with your health care provider. Elsevier Patient Education  [place]. Blood Glucose Monitoring, Adult Monitoring your blood sugar (glucose) is an important part of managing your diabetes. Blood glucose monitoring involves checking your blood glucose as often as directed and keeping a log or record of your results over time. Checking your blood glucose regularly and keeping a blood glucose log can: Help you and your health care provider adjust your diabetes management plan as needed, including your medicines or insulin. Help you understand how food, exercise, illnesses, and medicines affect your blood glucose. Let you know what your blood glucose is at any time. You can quickly find out if you have low blood glucose (hypoglycemia) or high blood glucose (hyperglycemia). Your health care provider will set individualized treatment goals for you. Your goals will be based on your age, other medical conditions you have, and how you respond to diabetes treatment. Generally, the goal of treatment is to maintain the following blood glucose levels: Before meals (preprandial): 80-130 mg/dL (4.4-7.2 mmol/L). After meals (postprandial): below 180 mg/dL (10 mmol/L). A1C level: less than 7%. Supplies needed: Blood glucose meter. Test strips for your meter. Each meter has its own strips. You must use the strips that came with your meter. A needle to prick your finger (lancet). Do not use a lancet more than one time. A device that holds the lancet (lancing device). A journal or log book to write down your results. How to check your blood glucose Checking your blood  glucose  Wash your hands for at least 20 seconds with soap and water. Prick the side of your finger (not the tip) with the lancet. Do not use the same finger consecutively. Gently rub the finger until a small drop of blood appears. Follow instructions that come with your meter for inserting the test strip, applying blood to the strip, and using your blood glucose meter. Write down your result and any notes in your log. Using alternative sites Some meters allow you to use areas of your body other than your finger (alternative sites) to test your blood. The most common alternative sites are the forearm, the thigh, and the palm of your hand. Alternative sites may not be as accurate  as the fingers because blood flow is slower in those areas. This means that the result you get may be delayed, and it may be different from the result that you would get from your finger. Use the finger only, and do not use alternative sites, if: You think you have hypoglycemia. You sometimes do not know that your blood glucose is getting low (hypoglycemia unawareness). General tips and recommendations Blood glucose log  Every time you check your blood glucose, write down your result. Also write down any notes about things that may be affecting your blood glucose, such as your diet and exercise for the day. This information can help you and your health care provider: Look for patterns in your blood glucose over time. Adjust your diabetes management plan as needed. Check if your meter allows you to download your records to a computer or if there is an app for the meter. Most glucose meters store a record of glucose readings in the meter. If you have type 1 diabetes: Check your blood glucose 4 or more times a day if you are on intensive insulin therapy with multiple daily injections (MDI) or if you are using an insulin pump. Check your blood glucose: Before every meal and snack. Before bedtime. Also check your blood  glucose: If you have symptoms of hypoglycemia. After treating low blood glucose. Before doing activities that create a risk for injury, like driving or using machinery. Before and after exercise. Two hours after a meal. Occasionally between 2:00 a.m. and 3:00 a.m., as directed. You may need to check your blood glucose more often, 6-10 times per day, if: You have diabetes that is not well controlled. You are ill. You have a history of severe hypoglycemia. You have hypoglycemia unawareness. If you have type 2 diabetes: Check your blood glucose 2 or more times a day if you take insulin or other diabetes medicines. Check your blood glucose 4 or more times a day if you are on intensive insulin therapy. Occasionally, you may also need to check your glucose between 2:00 a.m. and 3:00 a.m., as directed. Also check your blood glucose: Before and after exercise. Before doing activities that create a risk for injury, like driving or using machinery. You may need to check your blood glucose more often if: Your medicine is being adjusted. Your diabetes is not well controlled. You are ill. General tips Make sure you always have your supplies with you. After you use a few boxes of test strips, adjust (calibrate) your blood glucose meter by following instructions that came with your meter. If you have questions or need help, all blood glucose meters have a 24-hour hotline phone number available that you can call. Also contact your health care provider with questions or concerns you may have. Where to find more information The American Diabetes Association: www.diabetes.org The Association of Diabetes Care & Education Specialists: www.diabeteseducator.org Contact a health care provider if: Your blood glucose is at or above 240 mg/dL (13.3 mmol/L) for 2 days in a row. You have been sick or have had a fever for 2 days or longer, and you are not getting better. You have any of the following problems for  more than 6 hours: You cannot eat or drink. You have nausea or vomiting. You have diarrhea. Get help right away if: Your blood glucose is lower than 54 mg/dL (3 mmol/L). You become confused, or you have trouble thinking clearly. You have difficulty breathing. You have moderate or large ketone levels in  your urine. These symptoms may represent a serious problem that is an emergency. Do not wait to see if the symptoms will go away. Get medical help right away. Call your local emergency services (911 in the [place] Do not drive yourself to the hospital. Summary Monitoring your blood glucose is an important part of managing your diabetes. Blood glucose monitoring involves checking your blood glucose as often as directed and keeping a log or record of your results over time. Your health care provider will set individualized treatment goals for you. Your goals will be based on your age, other medical conditions you have, and how you respond to diabetes treatment. Every time you check your blood glucose, write down your result. Also, write down any notes about things that may be affecting your blood glucose, such as your diet and exercise for the day. This information is not intended to replace advice given to you by your health care provider. Make sure you discuss any questions you have with your health care provider. Document Revised: 07/26/2022 Document Reviewed: 09/13/2020 Elsevier Patient Education  [place]. Hyperglycemia Hyperglycemia is when the sugar (glucose) level in your blood is too high. High blood sugar can happen to people who have or do not have diabetes. High blood sugar can happen quickly. It can be an emergency. What are the causes? If you have diabetes, high blood sugar may be caused by: Medicines that increase blood sugar or affect your control of diabetes. Getting less physical activity. Overeating. Being sick or injured or having an infection. Having  surgery. Stress. Not giving yourself enough insulin (if you are taking it). You may have high blood sugar because you have diabetes that has not been diagnosed yet. If you do not have diabetes, high blood sugar may be caused by: Certain medicines. Stress. A bad illness. An infection. Having surgery. Diseases of the pancreas. What increases the risk? This condition is more likely to develop in people who have risk factors for diabetes, such as: Having a family member with diabetes. Certain conditions in which the body's defense system (immune system) attacks itself. These are called autoimmune disorders. Being overweight. Not being active. Having a condition called insulin resistance. Having a history of: Prediabetes. Diabetes when pregnant. Polycystic ovarian syndrome (PCOS). What are the signs or symptoms? This condition may not cause symptoms. If you do have symptoms, they may include: Feeling more thirsty than normal. Needing to pee (urinate) more often than normal. Hunger. Feeling very tired. Blurry eyesight (vision). You may get other symptoms as the condition gets worse, such as: Dry mouth. Pain in your belly (abdomen). Not being hungry (loss of appetite). Breath that smells fruity. Weakness. Weight loss that is not planned. A tingling or numb feeling in your hands or feet. A headache. Cuts or bruises that heal slowly. How is this treated? Treatment depends on the cause of your condition. Treatment may include: Taking medicine to control your blood sugar levels. Changing your medicine or dosage if you take insulin or other diabetes medicines. Lifestyle changes. These may include: Exercising more. Eating healthier foods. Losing weight. Treating an illness or infection. Checking your blood sugar more often. Stopping or reducing steroid medicines. If your condition gets very bad, you will need to be treated in the hospital. Follow these instructions at  home: General instructions Take over-the-counter and prescription medicines only as told by your doctor. Do not smoke or use any products that contain nicotine or tobacco. If you need help quitting, ask  your doctor. If you drink alcohol: Limit how much you have to: 0-1 drink a day for women who are not pregnant. 0-2 drinks a day for men. Know how much alcohol is in a drink. In the [place] one drink equals one 12 oz bottle of beer (355 mL), one 5 oz glass of wine (148 mL), or one 1 oz glass of hard liquor (44 mL). Manage stress. If you need help with this, ask your doctor. Do exercises as told by your doctor. Keep all follow-up visits. Eating and drinking  Stay at a healthy weight. Make sure you drink enough fluid when you: Exercise. Get sick. Are in hot temperatures. Drink enough fluid to keep your pee (urine) pale yellow. If you have diabetes:  Know the symptoms of high blood sugar. Follow your diabetes management plan as told by your doctor. Make sure you: Take insulin and medicines as told. Follow your exercise plan. Follow your meal plan. Eat on time. Do not skip meals. Check your blood sugar as often as told. Make sure you check before and after exercise. If you exercise longer or in a different way, check your blood sugar more often. Follow your sick day plan whenever you cannot eat or drink normally. Make this plan ahead of time with your doctor. Share your diabetes management plan with people in your workplace, school, and household. Check your pee for ketones when you are ill and as told by your doctor. Carry a card or wear jewelry that says that you have diabetes. Where to find more information American Diabetes Association: www.diabetes.org Contact a doctor if: Your blood sugar level is at or above 240 mg/dL (13.3 mmol/L) for 2 days in a row. You have problems keeping your blood sugar in your target range. You have high blood pressure often. You have signs of illness,  such as: Feeling like you may vomit (feeling nauseous). Vomiting. A fever. Get help right away if: Your blood sugar monitor reads "high" even when you are taking insulin. You have trouble breathing. You have a change in how you think, feel, or act (mental status). You feel like you may vomit, and the feeling does not go away. You cannot stop vomiting. These symptoms may be an emergency. Get medical help right away. Call your local emergency services (911 in the [place] Do not wait to see if the symptoms will go away. Do not drive yourself to the hospital. Summary Hyperglycemia is when the sugar (glucose) level in your blood is too high. High blood sugar can happen to people who have or do not have diabetes. Make sure you drink enough fluids and follow your meal plan. Exercise as often as told by your doctor. Contact your doctor if you have problems keeping your blood sugar in your target range. This information is not intended to replace advice given to you by your health care provider. Make sure you discuss any questions you have with your health care provider. Document Revised: 09/29/2020 Document Reviewed: 09/29/2020 Elsevier Patient Education  [place]. Hypoglycemia Hypoglycemia is when the sugar (glucose) level in your blood is too low. Low blood sugar can happen to people who have diabetes and people who do not have diabetes. Low blood sugar can happen quickly, and it can be an emergency. What are the causes? This condition happens most often in people who have diabetes. It may be caused by: Diabetes medicine. Not eating enough, or not eating often enough. Doing more physical activity. Drinking alcohol  on an empty stomach. If you do not have diabetes, this condition may be caused by: A tumor in the pancreas. Not eating enough, or not eating for long periods at a time (fasting). A very bad infection or illness. Problems after having weight loss (bariatric)  surgery. Kidney failure or liver failure. Certain medicines. What increases the risk? This condition is more likely to develop in people who: Have diabetes and take medicines to lower their blood sugar. Abuse alcohol. Have a very bad illness. What are the signs or symptoms? Mild Hunger. Sweating and feeling clammy. Feeling dizzy or light-headed. Being sleepy or having trouble sleeping. Feeling like you may vomit (nauseous). A fast heartbeat. A headache. Blurry vision. Mood changes, such as: Being grouchy. Feeling worried or nervous (anxious). Tingling or loss of feeling (numbness) around your mouth, lips, or tongue. Moderate Confusion and poor judgment. Behavior changes. Weakness. Uneven heartbeat. Trouble with moving (coordination). Very low Very low blood sugar (severe hypoglycemia) is a medical emergency. It can cause: Fainting. Seizures. Loss of consciousness (coma). Death. How is this treated? Treating low blood sugar Low blood sugar is often treated by eating or drinking something that has sugar in it right away. The food or drink should contain 15 grams of a fast-acting carb (carbohydrate). Options include: 4 oz (120 mL) of fruit juice. 4 oz (120 mL) of regular soda (not diet soda). A few pieces of hard candy. Check food labels to see how many pieces to eat for 15 grams. 1 Tbsp (15 mL) of sugar or honey. 4 glucose tablets. 1 tube of glucose gel. Treating low blood sugar if you have diabetes If you can think clearly and swallow safely, follow the 15:15 rule: Take 15 grams of a fast-acting carb. Talk with your doctor about how much you should take. Always keep a source of fast-acting carb with you, such as: Glucose tablets (take 4 tablets). A few pieces of hard candy. Check food labels to see how many pieces to eat for 15 grams. 4 oz (120 mL) of fruit juice. 4 oz (120 mL) of regular soda (not diet soda). 1 Tbsp (15 mL) of honey or sugar. 1 tube of glucose  gel. Check your blood sugar 15 minutes after you take the carb. If your blood sugar is still at or below 70 mg/dL (3.9 mmol/L), take 15 grams of a carb again. If your blood sugar does not go above 70 mg/dL (3.9 mmol/L) after 3 tries, get help right away. After your blood sugar goes back to normal, eat a meal or a snack within 1 hour.  Treating very low blood sugar If your blood sugar is below 54 mg/dL (3 mmol/L), you have very low blood sugar, or severe hypoglycemia. This is an emergency. Get medical help right away. If you have very low blood sugar and you cannot eat or drink, you will need to be given a hormone called glucagon. A family member or friend should learn how to check your blood sugar and how to give you glucagon. Ask your doctor if you need to have an emergency glucagon kit at home. Very low blood sugar may also need to be treated in a hospital. Follow these instructions at home: General instructions Take over-the-counter and prescription medicines only as told by your doctor. Stay aware of your blood sugar as told by your doctor. If you drink alcohol: Limit how much you have to: 0-1 drink a day for women who are not pregnant. 0-2 drinks a day for  men. Know how much alcohol is in your drink. In the [place] one drink equals one 12 oz bottle of beer (355 mL), one 5 oz glass of wine (148 mL), or one 1 oz glass of hard liquor (44 mL). Be sure to eat food when you drink alcohol. Know that your body absorbs alcohol quickly. This may lead to low blood sugar later. Be sure to keep checking your blood sugar. Keep all follow-up visits. If you have diabetes:  Always have a fast-acting carb (15 grams) with you to treat low blood sugar. Follow your diabetes care plan as told by your doctor. Make sure you: Know the symptoms of low blood sugar. Check your blood sugar as often as told. Always check it before and after exercise. Always check your blood sugar before you drive. Take your  medicines as told. Follow your meal plan. Eat on time. Do not skip meals. Share your diabetes care plan with: Your work or school. People you live with. Carry a card or wear jewelry that says you have diabetes. Where to find more information American Diabetes Association: www.diabetes.org Contact a doctor if: You have trouble keeping your blood sugar in your target range. You have low blood sugar often. Get help right away if: You still have symptoms after you eat or drink something that contains 15 grams of fast-acting carb, and you cannot get your blood sugar above 70 mg/dL by following the 15:15 rule. Your blood sugar is below 54 mg/dL (3 mmol/L). You have a seizure. You faint. These symptoms may be an emergency. Get help right away. Call your local emergency services (911 in the [place] Do not wait to see if the symptoms will go away. Do not drive yourself to the hospital. Summary Hypoglycemia happens when the level of sugar (glucose) in your blood is too low. Low blood sugar can happen to people who have diabetes and people who do not have diabetes. Low blood sugar can happen quickly, and it can be an emergency. Make sure you know the symptoms of low blood sugar and know how to treat it. Always keep a source of sugar (fast-acting carb) with you to treat low blood sugar. This information is not intended to replace advice given to you by your health care provider. Make sure you discuss any questions you have with your health care provider. Document Revised: 11/16/2020 Document Reviewed: 11/16/2020 Elsevier Patient Education  [place]. Diabetes Mellitus and Nutrition, Adult When you have diabetes, or diabetes mellitus, it is very important to have healthy eating habits because your blood sugar (glucose) levels are greatly affected by what you eat and drink. Eating healthy foods in the right amounts, at about the same times every day, can help you: Manage your blood  glucose. Lower your risk of heart disease. Improve your blood pressure. Reach or maintain a healthy weight. What can affect my meal plan? Every person with diabetes is different, and each person has different needs for a meal plan. Your health care provider may recommend that you work with a dietitian to make a meal plan that is best for you. Your meal plan may vary depending on factors such as: The calories you need. The medicines you take. Your weight. Your blood glucose, blood pressure, and cholesterol levels. Your activity level. Other health conditions you have, such as heart or kidney disease. How do carbohydrates affect me? Carbohydrates, also called carbs, affect your blood glucose level more than any other type of food. Eating carbs  raises the amount of glucose in your blood. It is important to know how many carbs you can safely have in each meal. This is different for every person. Your dietitian can help you calculate how many carbs you should have at each meal and for each snack. How does alcohol affect me? Alcohol can cause a decrease in blood glucose (hypoglycemia), especially if you use insulin or take certain diabetes medicines by mouth. Hypoglycemia can be a life-threatening condition. Symptoms of hypoglycemia, such as sleepiness, dizziness, and confusion, are similar to symptoms of having too much alcohol. Do not drink alcohol if: Your health care provider tells you not to drink. You are pregnant, may be pregnant, or are planning to become pregnant. If you drink alcohol: Limit how much you have to: 0-1 drink a day for women. 0-2 drinks a day for men. Know how much alcohol is in your drink. In the [place] one drink equals one 12 oz bottle of beer (355 mL), one 5 oz glass of wine (148 mL), or one 1 oz glass of hard liquor (44 mL). Keep yourself hydrated with water, diet soda, or unsweetened iced tea. Keep in mind that regular soda, juice, and other mixers may contain a lot of  sugar and must be counted as carbs. What are tips for following this plan?  Reading food labels Start by checking the serving size on the Nutrition Facts label of packaged foods and drinks. The number of calories and the amount of carbs, fats, and other nutrients listed on the label are based on one serving of the item. Many items contain more than one serving per package. Check the total grams (g) of carbs in one serving. Check the number of grams of saturated fats and trans fats in one serving. Choose foods that have a low amount or none of these fats. Check the number of milligrams (mg) of salt (sodium) in one serving. Most people should limit total sodium intake to less than 2,300 mg per day. Always check the nutrition information of foods labeled as "low-fat" or "nonfat." These foods may be higher in added sugar or refined carbs and should be avoided. Talk to your dietitian to identify your daily goals for nutrients listed on the label. Shopping Avoid buying canned, pre-made, or processed foods. These foods tend to be high in fat, sodium, and added sugar. Shop around the outside edge of the grocery store. This is where you will most often find fresh fruits and vegetables, bulk grains, fresh meats, and fresh dairy products. Cooking Use low-heat cooking methods, such as baking, instead of high-heat cooking methods, such as deep frying. Cook using healthy oils, such as olive, canola, or sunflower oil. Avoid cooking with butter, cream, or high-fat meats. Meal planning Eat meals and snacks regularly, preferably at the same times every day. Avoid going long periods of time without eating. Eat foods that are high in fiber, such as fresh fruits, vegetables, beans, and whole grains. Eat 4-6 oz (112-168 g) of lean protein each day, such as lean meat, chicken, fish, eggs, or tofu. One ounce (oz) (28 g) of lean protein is equal to: 1 oz (28 g) of meat, chicken, or fish. 1 egg.  cup (62 g) of  tofu. Eat some foods each day that contain healthy fats, such as avocado, nuts, seeds, and fish. What foods should I eat? Fruits Berries. Apples. Oranges. Peaches. Apricots. Plums. Grapes. Mangoes. Papayas. Pomegranates. Kiwi. Cherries. Vegetables Leafy greens, including lettuce, spinach, kale, chard, collard greens,  mustard greens, and cabbage. Beets. Cauliflower. Broccoli. Carrots. Green beans. Tomatoes. Peppers. Onions. Cucumbers. Brussels sprouts. Grains Whole grains, such as whole-wheat or whole-grain bread, crackers, tortillas, cereal, and pasta. Unsweetened oatmeal. Quinoa. Brown or wild rice. Meats and other proteins Seafood. Poultry without skin. Lean cuts of poultry and beef. Tofu. Nuts. Seeds. Dairy Low-fat or fat-free dairy products such as milk, yogurt, and cheese. The items listed above may not be a complete list of foods and beverages you can eat and drink. Contact a dietitian for more information. What foods should I avoid? Fruits Fruits canned with syrup. Vegetables Canned vegetables. Frozen vegetables with butter or cream sauce. Grains Refined white flour and flour products such as bread, pasta, snack foods, and cereals. Avoid all processed foods. Meats and other proteins Fatty cuts of meat. Poultry with skin. Breaded or fried meats. Processed meat. Avoid saturated fats. Dairy Full-fat yogurt, cheese, or milk. Beverages Sweetened drinks, such as soda or iced tea. The items listed above may not be a complete list of foods and beverages you should avoid. Contact a dietitian for more information. Questions to ask a health care provider Do I need to meet with a certified diabetes care and education specialist? Do I need to meet with a dietitian? What number can I call if I have questions? When are the best times to check my blood glucose? Where to find more information: American Diabetes Association: diabetes.org Academy of Nutrition and Dietetics:  eatright.[employer] of Diabetes and Digestive and Kidney Diseases: [employer] Association of Diabetes Care & Education Specialists: diabeteseducator.org Summary It is important to have healthy eating habits because your blood sugar (glucose) levels are greatly affected by what you eat and drink. It is important to use alcohol carefully. A healthy meal plan will help you manage your blood glucose and lower your risk of heart disease. Your health care provider may recommend that you work with a dietitian to make a meal plan that is best for you. This information is not intended to replace advice given to you by your health care provider. Make sure you discuss any questions you have with your health care provider. Document Revised: 07/19/2020 Document Reviewed: 07/19/2020 Elsevier Patient Education  [place] Many factors influence your heart health, including eating and exercise habits. Heart health is also called coronary health. Coronary risk increases with abnormal blood fat (lipid) levels. A heart-healthy eating plan includes limiting unhealthy fats, increasing healthy fats, limiting salt (sodium) intake, and making other diet and lifestyle changes. What is my plan? Your health care provider may recommend that: You limit your fat intake to _________% or less of your total calories each day. You limit your saturated fat intake to _________% or less of your total calories each day. You limit the amount of cholesterol in your diet to less than _________ mg per day. You limit the amount of sodium in your diet to less than _________ mg per day. What are tips for following this plan? Cooking Cook foods using methods other than frying. Baking, boiling, grilling, and broiling are all good options. Other ways to reduce fat include: Removing the skin from poultry. Removing all visible fats from meats. Steaming vegetables in water or broth. Meal  planning  At meals, imagine dividing your plate into fourths: Fill one-half of your plate with vegetables and green salads. Fill one-fourth of your plate with whole grains. Fill one-fourth of your plate with lean protein foods. Eat 2-4 cups of vegetables per  day. One cup of vegetables equals 1 cup (91 g) broccoli or cauliflower florets, 2 medium carrots, 1 large bell pepper, 1 large sweet potato, 1 large tomato, 1 medium white potato, 2 cups (150 g) raw leafy greens. Eat 1-2 cups of fruit per day. One cup of fruit equals 1 small apple, 1 large banana, 1 cup (237 g) mixed fruit, 1 large orange,  cup (82 g) dried fruit, 1 cup (240 mL) 100% fruit juice. Eat more foods that contain soluble fiber. Examples include apples, broccoli, carrots, beans, peas, and barley. Aim to get 25-30 g of fiber per day. Increase your consumption of legumes, nuts, and seeds to 4-5 servings per week. One serving of dried beans or legumes equals  cup (90 g) cooked, 1 serving of nuts is  oz (12 almonds, 24 pistachios, or 7 walnut halves), and 1 serving of seeds equals  oz (8 g). Fats Choose healthy fats more often. Choose monounsaturated and polyunsaturated fats, such as olive and canola oils, avocado oil, flaxseeds, walnuts, almonds, and seeds. Eat more omega-3 fats. Choose salmon, mackerel, sardines, tuna, flaxseed oil, and ground flaxseeds. Aim to eat fish at least 2 times each week. Check food labels carefully to identify foods with trans fats or high amounts of saturated fat. Limit saturated fats. These are found in animal products, such as meats, butter, and cream. Plant sources of saturated fats include palm oil, palm kernel oil, and coconut oil. Avoid foods with partially hydrogenated oils in them. These contain trans fats. Examples are stick margarine, some tub margarines, cookies, crackers, and other baked goods. Avoid fried foods. General information Eat more home-cooked food and less restaurant, buffet, and  fast food. Limit or avoid alcohol. Limit foods that are high in added sugar and simple starches such as foods made using white refined flour (white breads, pastries, sweets). Lose weight if you are overweight. Losing just 5-10% of your body weight can help your overall health and prevent diseases such as diabetes and heart disease. Monitor your sodium intake, especially if you have high blood pressure. Talk with your health care provider about your sodium intake. Try to incorporate more vegetarian meals weekly. What foods should I eat? Fruits All fresh, canned (in natural juice), or frozen fruits. Vegetables Fresh or frozen vegetables (raw, steamed, roasted, or grilled). Green salads. Grains Most grains. Choose whole wheat and whole grains most of the time. Rice and pasta, including brown rice and pastas made with whole wheat. Meats and other proteins Lean, well-trimmed beef, veal, pork, and lamb. Chicken and [place] without skin. All fish and shellfish. Wild duck, rabbit, pheasant, and venison. Egg whites or low-cholesterol egg substitutes. Dried beans, peas, lentils, and tofu. Seeds and most nuts. Dairy Low-fat or nonfat cheeses, including ricotta and mozzarella. Skim or 1% milk (liquid, powdered, or evaporated). Buttermilk made with low-fat milk. Nonfat or low-fat yogurt. Fats and oils Non-hydrogenated (trans-free) margarines. Vegetable oils, including soybean, sesame, sunflower, olive, avocado, peanut, safflower, corn, canola, and cottonseed. Salad dressings or mayonnaise made with a vegetable oil. Beverages Water (mineral or sparkling). Coffee and tea. Unsweetened ice tea. Diet beverages. Sweets and desserts Sherbet, gelatin, and fruit ice. Small amounts of dark chocolate. Limit all sweets and desserts. Seasonings and condiments All seasonings and condiments. The items listed above may not be a complete list of foods and beverages you can eat. Contact a dietitian for more options. What  foods should I avoid? Fruits Canned fruit in heavy syrup. Fruit in cream or butter sauce.  Fried fruit. Limit coconut. Vegetables Vegetables cooked in cheese, cream, or butter sauce. Fried vegetables. Grains Breads made with saturated or trans fats, oils, or whole milk. Croissants. Sweet rolls. Donuts. High-fat crackers, such as cheese crackers and chips. Meats and other proteins Fatty meats, such as hot dogs, ribs, sausage, bacon, rib-eye roast or steak. High-fat deli meats, such as salami and bologna. Caviar. Domestic duck and goose. Organ meats, such as liver. Dairy Cream, sour cream, cream cheese, and creamed cottage cheese. Whole-milk cheeses. Whole or 2% milk (liquid, evaporated, or condensed). Whole buttermilk. Cream sauce or high-fat cheese sauce. Whole-milk yogurt. Fats and oils Meat fat, or shortening. Cocoa butter, hydrogenated oils, palm oil, coconut oil, palm kernel oil. Solid fats and shortenings, including bacon fat, salt pork, lard, and butter. Nondairy cream substitutes. Salad dressings with cheese or sour cream. Beverages Regular sodas and any drinks with added sugar. Sweets and desserts Frosting. Pudding. Cookies. Cakes. Pies. Milk chocolate or white chocolate. Buttered syrups. Full-fat ice cream or ice cream drinks. The items listed above may not be a complete list of foods and beverages to avoid. Contact a dietitian for more information. Summary Heart-healthy meal planning includes limiting unhealthy fats, increasing healthy fats, limiting salt (sodium) intake and making other diet and lifestyle changes. Lose weight if you are overweight. Losing just 5-10% of your body weight can help your overall health and prevent diseases such as diabetes and heart disease. Focus on eating a balance of foods, including fruits and vegetables, low-fat or nonfat dairy, lean protein, nuts and legumes, whole grains, and heart-healthy oils and fats. This information is not intended to replace  advice given to you by your health care provider. Make sure you discuss any questions you have with your health care provider. Document Revised: 01/21/2022 Document Reviewed: 01/21/2022 Elsevier Patient Education  [place].

## 2023-02-07 NOTE — Chronic Care Management (AMB) (Signed)
Chronic Care Management   CCM RN Visit Note  02/07/2023 Name: [name] MRN: [id number] DOB: 09/26/1946  Subjective: [name] is a 77 y.o. year old female who is a primary care patient of [name], MD. The patient was referred to the Chronic Care Management team for assistance with care management needs subsequent to provider initiation of CCM services and plan of care.    Today's Visit:  Engaged with patient by telephone for initial visit.     SDOH Interventions Today    Flowsheet Row Most Recent Value  SDOH Interventions   Food Insecurity Interventions Intervention Not Indicated  Housing Interventions Intervention Not Indicated  Transportation Interventions Intervention Not Indicated  Utilities Interventions Intervention Not Indicated  Alcohol Usage Interventions Intervention Not Indicated (Score <7)  Financial Strain Interventions Intervention Not Indicated, Other (Comment)  [PAP for Ozempic]  Physical Activity Interventions Other (Comments)  [no structured activity, encouraged activity]  Stress Interventions Intervention Not Indicated  Social Connections Interventions Intervention Not Indicated         Goals Addressed             This Visit's Progress    CCM Expected Outcome:  Monitor, Self-Manage and Reduce Symptoms of Diabetes       Current Barriers:  Knowledge Deficits related to the importance of checking blood sugars on a regular basis and the benefits of following a heart healthy/ADA diet Care Coordination needs related to pharm D support with effective medication management  in a patient with DM Chronic Disease Management support and education needs related to effective management of DM  Planned Interventions: Provided education to patient about basic DM disease process; Reviewed medications with patient and discussed importance of medication adherence;        Reviewed prescribed diet with patient heart healthy/ADA diet. Will send  information through my Chart for information on eating healthy and following dietary restrictions; Counseled on importance of regular laboratory monitoring as prescribed;        Discussed plans with patient for ongoing care management follow up and provided patient with direct contact information for care management team;      Provided patient with written educational materials related to hypo and hyperglycemia and importance of correct treatment. Education provided on the sx and sx of hypo and hyperglycemia. Will send information by my Chart for education and ongoing support of effective management of DM;       Reviewed scheduled/upcoming provider appointments including: no upcoming appointments with pcp, reminder provided to call for needs or changes. Has upcoming appointment with the pharm D on 03-21-2023- reminder given today;         Advised patient, providing education and rationale, to check cbg once daily, when you have symptoms of low or high blood sugar, and encouraged the patient to take blood sugars on a regular basis for baseline status  and record        call provider for findings outside established parameters;       Referral made to pharmacy team for assistance with medications management and procurement. Has pharm D appointment on 03-21-2023;       Review of patient status, including review of consultants reports, relevant laboratory and other test results, and medications completed;       Advised patient to discuss changes in DM, questions or concerns with provider;      Screening for signs and symptoms of depression related to chronic disease state;  Assessed social determinant of health barriers;         Symptom Management: Take medications as prescribed   Attend all scheduled provider appointments Call provider office for new concerns or questions  call the Suicide and Crisis Lifeline: 988 call the [place] National Suicide Prevention Lifeline: (732) 783-3085 or TTY: 725-816-5027  TTY (709)177-5864) to talk to a trained counselor call 1-800-273-TALK (toll free, 24 hour hotline) go to [facility] [address] [place] [phone]) if experiencing a Mental Health or [place]  keep appointment with eye doctor check blood sugar at prescribed times: once daily and when you have symptoms of low or high blood sugar check feet daily for cuts, sores or redness trim toenails straight across wash and dry feet carefully every day wear comfortable, cotton socks wear comfortable, well-fitting shoes  Follow Up Plan: Telephone follow up appointment with care management team member scheduled for: 04-04-2023 at 45 am       CCM Expected Outcome:  Monitor, Self-Manage, and Reduce Symptoms of Hypertension       Current Barriers:  Chronic Disease Management support and education needs related to effective management of HTN  Planned Interventions: Evaluation of current treatment plan related to hypertension self management and patient's adherence to plan as established by provider;   Provided education to patient re: stroke prevention, s/s of heart attack and stroke; Reviewed prescribed diet heart healthy/ADA diet Reviewed medications with patient and discussed importance of compliance;  Discussed plans with patient for ongoing care management follow up and provided patient with direct contact information for care management team; Advised patient, providing education and rationale, to monitor blood pressure daily and record, calling PCP for findings outside established parameters;  Reviewed scheduled/upcoming provider appointments including: does not have any upcoming appointments with the pcp. Knows to call for changes. Advised patient to discuss changes in HTN or heart health with provider; Provided education on prescribed diet heart healthy/ADA diet. Education provided and will send information by myChart ;  Discussed  complications of poorly controlled blood pressure such as heart disease, stroke, circulatory complications, vision complications, kidney impairment, sexual dysfunction;  Screening for signs and symptoms of depression related to chronic disease state;  Assessed social determinant of health barriers;   Symptom Management: Take medications as prescribed   Attend all scheduled provider appointments Call provider office for new concerns or questions  call the Suicide and Crisis Lifeline: 988 call the [place] National Suicide Prevention Lifeline: (669)789-6724 or TTY: 380-218-1396 TTY (561)486-3365) to talk to a trained counselor call 1-800-273-TALK (toll free, 24 hour hotline) if experiencing a Mental Health or [place]  check blood pressure weekly learn about high blood pressure call doctor for signs and symptoms of high blood pressure develop an action plan for high blood pressure keep all doctor appointments take medications for blood pressure exactly as prescribed report new symptoms to your doctor  Follow Up Plan: Telephone follow up appointment with care management team member scheduled for: 04-04-2023 at 1030 am          Plan:Telephone follow up appointment with care management team member scheduled for:  04-04-2023 at 53 am  Noreene Larsson RN, MSN, CCM RN Care Manager  Chronic Care Management Direct Number: [phone]

## 2023-02-07 NOTE — Plan of Care (Signed)
Chronic Care Management Provider Comprehensive Care Plan    02/07/2023 Name: [name] MRN: [id number] DOB: 23-Oct-1946  Referral to Chronic Care Management (CCM) services was placed by Provider:  Dr. [name] on Date: 01-18-2023.  Chronic Condition 1: DM Provider Assessment and Plan Chronic, LDL goal <70. She is now on  atorvastatin 80mg  daily. NO issues with meds.She will f/u in 4 months. Will consider decreasing dose of Ozempic.  - BMP8+EGFR - Lipid panel - CBC no Diff - Hemoglobin A1c   Expected Outcome/Goals Addressed This Visit (Provider CCM goals/Provider Assessment and plan   CCM (DIABETES) EXPECTED OUTCOME: MONITOR, SELF-MANAGE AND REDUCE SYMPTOMS OF DIABETES   Symptom Management Condition 1: Take all medications as prescribed Attend all scheduled provider appointments Call provider office for new concerns or questions  call the Suicide and Crisis Lifeline: 988 call the [place] National Suicide Prevention Lifeline: 601-607-5259 or TTY: 315-021-2877 Jack (206)004-3536) to talk to a trained counselor call 1-800-273-TALK (toll free, 24 hour hotline) go to [facility] [address] [place] [phone]) if experiencing a Mental Health or [place]  keep appointment with eye doctor check blood sugar at prescribed times: once daily and when you have symptoms of low or high blood sugar check feet daily for cuts, sores or redness trim toenails straight across wash and dry feet carefully every day wear comfortable, cotton socks wear comfortable, well-fitting shoes  Chronic Condition 2: HTN Provider Assessment and Plan  Chronic, well controlled.  She will c/w bisoprolol. She is no longer on losartan, stopped in 2015 due to chronic cough.  - Lipid panel   Expected Outcome/Goals Addressed This Visit (Provider CCM goals/Provider Assessment and plan   CCM (HYPERTENSION)  EXPECTED OUTCOME:  MONITOR,SELF- MANAGE AND  REDUCE SYMPTOMS OF HYPERTENSION  Symptom Management Condition 2: Take all medications as prescribed Attend all scheduled provider appointments Call provider office for new concerns or questions  call the Suicide and Crisis Lifeline: 988 call the [place] National Suicide Prevention Lifeline: 5182498605 or TTY: 207-507-2245 TTY 204-185-4795) to talk to a trained counselor call 1-800-273-TALK (toll free, 24 hour hotline) go to [facility] [address], [place] [phone]) if experiencing a Mental Health or [place]  check blood pressure weekly learn about high blood pressure call doctor for signs and symptoms of high blood pressure keep all doctor appointments take medications for blood pressure exactly as prescribed report new symptoms to your doctor  Problem List Patient Active Problem List   Diagnosis Date Noted   Malnutrition of moderate degree 12/14/2022   Influenza A 12/11/2022   Chronic pain 12/11/2022   Dyslipidemia associated with type 2 diabetes mellitus ([place]) 08/10/2022   BMI 27.0-27.9,adult 08/10/2022   Adenocarcinoma, lung, right ([place]) 05/28/2022   Hypertensive heart disease without heart failure 12/31/2021   Atherosclerosis of aorta ([place]) 09/16/2021   Atherosclerosis 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 09/16/2021   Myalgia 09/16/2021   Solitary pulmonary nodule on lung CT 08/24/2021   Chest tightness 04/18/2021   Former smoker 08/21/2020   Pure hypercholesterolemia    Type II diabetes mellitus, uncontrolled 11/09/2018   Diarrhea    Community acquired pneumonia of left lower lobe of lung    Rhinovirus infection 10/02/2016   Acute asthma exacerbation 10/02/2016   COPD with acute exacerbation ([place]) 09/28/2016   Hypoxia 09/28/2016   Shortness of breath 09/28/2016   Essential hypertension 11/03/2014   Cough 10/19/2014  COPD GOLD II  03/21/2014   Rhinitis, nonallergic  03/21/2014    Medication Management  Current Outpatient Medications:    albuterol (VENTOLIN HFA) 108 (90 Base) MCG/ACT inhaler, INHALE 2 PUFFS INTO THE LUNGS EVERY 4 HOURS AS NEEDED FOR WHEEZING OR SHORTNESS OF BREATH, Disp: 18 g, Rfl: 12   aspirin 81 MG tablet, Take 81 mg by mouth daily., Disp: , Rfl:    atorvastatin (LIPITOR) 80 MG tablet, Take one tab po M-F and skip the weekends, Disp: 90 tablet, Rfl: 1   Biotin 10000 MCG TBDP, Take 10,000 mcg by mouth daily., Disp: , Rfl:    bisoprolol (ZEBETA) 5 MG tablet, TAKE 1/2 TABLET BY MOUTH EVERY DAY, Disp: 45 tablet, Rfl: 3   Budeson-Glycopyrrol-Formoterol (BREZTRI AEROSPHERE) 160-9-4.8 MCG/ACT AERO, Inhale 2 puffs into the lungs 2 (two) times daily., Disp: 10.7 g, Rfl: 6   CALCIUM-MAGNESIUM-ZINC PO, Take 1 capsule by mouth at bedtime., Disp: , Rfl:    Cholecalciferol (VITAMIN D3) 2000 UNITS TABS, Take 2,000 Units by mouth at bedtime., Disp: , Rfl:    DULoxetine (CYMBALTA) 60 MG capsule, TAKE 1 CAPSULE(60 MG) BY MOUTH DAILY, Disp: 90 capsule, Rfl: 1   famotidine (PEPCID) 20 MG tablet, One after supper, Disp: 90 tablet, Rfl: 1   glucose blood (ACCU-CHEK GUIDE) test strip, 1 each by Other route daily. Use as instructed to check blood sugars 1 time per day dx: e11.22, Disp: 50 each, Rfl: 11   ipratropium-albuterol (DUONEB) 0.5-2.5 (3) MG/3ML SOLN, USE 3 ML VIA NEBULIZER EVERY 6 HOURS AS NEEDED, Disp: 180 mL, Rfl: 1   Magnesium 250 MG TABS, Take 1 tablet by mouth daily., Disp: , Rfl:    montelukast (SINGULAIR) 10 MG tablet, TAKE 1 TABLET(10 MG) BY MOUTH AT BEDTIME, Disp: 90 tablet, Rfl: 3   oxyCODONE-acetaminophen (PERCOCET) 10-325 MG tablet, Take 1 tablet by mouth 4 (four) times daily as needed., Disp: , Rfl:    pantoprazole (PROTONIX) 40 MG tablet, TAKE 1 TABLET BY MOUTH 30 MINUTES BEFORE FIRST MEAL OF THE DAY, Disp: 90 tablet, Rfl: 2   predniSONE (DELTASONE) 10 MG tablet, Take 1 tablet (10 mg total) by mouth daily. Take 4 pills daily for 3 days  then 3 pills daily for 3 days then 2 pills daily for 3 days then 1 pill daily for 3 days then stop, Disp: 30 tablet, Rfl: 0   pregabalin (LYRICA) 100 MG capsule, Take 100 mg by mouth 3 (three) times daily., Disp: , Rfl:    Semaglutide, 1 MG/DOSE, (OZEMPIC, 1 MG/DOSE,) 4 MG/3ML SOPN, Inject 1 mg into the skin once a week. Friday, Disp: 3 mL, Rfl: 3  Cognitive Assessment Identity Confirmed: : Name; DOB Cognitive Status: Normal   Functional Assessment Hearing Difficulty or Deaf: yes Hearing Management: some hearing loss, saw the doctor on 01-01-2023 Wear Glasses or Blind: yes Vision Management: wears reading glasses- has eyes checks yearly Concentrating, Remembering or Making Decisions Difficulty (CP): no Difficulty Communicating: no Difficulty Eating/Swallowing: no Walking or Climbing Stairs Difficulty: no Dressing/Bathing Difficulty: no Doing Errands Independently Difficulty (such as shopping) (CP): no Change in Functional Status Since Onset of Current Illness/Injury: no   Caregiver Assessment  Primary Source of Support/Comfort: spouse Name of Support/Comfort Primary Source: Cassondra Stachowski- husband People in Home: child(ren), adult; spouse Name(s) of People in Home: husband- [name] and adult daughter Family Caregiver if Needed: none Primary Roles/Responsibilities: retired   Planned Interventions  Provided education to patient about basic DM disease process; Reviewed medications with patient and discussed  importance of medication adherence;        Reviewed prescribed diet with patient heart healthy/ADA diet. Will send information through my Chart for information on eating healthy and following dietary restrictions; Counseled on importance of regular laboratory monitoring as prescribed;        Discussed plans with patient for ongoing care management follow up and provided patient with direct contact information for care management team;      Provided patient with written educational  materials related to hypo and hyperglycemia and importance of correct treatment. Education provided on the sx and sx of hypo and hyperglycemia. Will send information by my Chart for education and ongoing support of effective management of DM;       Reviewed scheduled/upcoming provider appointments including: no upcoming appointments with pcp, reminder provided to call for needs or changes. Has upcoming appointment with the pharm D on 03-21-2023- reminder given today;         Advised patient, providing education and rationale, to check cbg once daily, when you have symptoms of low or high blood sugar, and encouraged the patient to take blood sugars on a regular basis for baseline status  and record        call provider for findings outside established parameters;       Referral made to pharmacy team for assistance with medications management and procurement. Has pharm D appointment on 03-21-2023;       Review of patient status, including review of consultants reports, relevant laboratory and other test results, and medications completed;       Advised patient to discuss changes in DM, questions or concerns with provider;      Screening for signs and symptoms of depression related to chronic disease state;        Assessed social determinant of health barriers;        Evaluation of current treatment plan related to hypertension self management and patient's adherence to plan as established by provider;   Provided education to patient re: stroke prevention, s/s of heart attack and stroke; Reviewed prescribed diet heart healthy/ADA diet Reviewed medications with patient and discussed importance of compliance;  Discussed plans with patient for ongoing care management follow up and provided patient with direct contact information for care management team; Advised patient, providing education and rationale, to monitor blood pressure daily and record, calling PCP for findings outside established parameters;   Reviewed scheduled/upcoming provider appointments including: does not have any upcoming appointments with the pcp. Knows to call for changes. Advised patient to discuss changes in HTN or heart health with provider; Provided education on prescribed diet heart healthy/ADA diet. Education provided and will send information by myChart ;  Discussed complications of poorly controlled blood pressure such as heart disease, stroke, circulatory complications, vision complications, kidney impairment, sexual dysfunction;  Screening for signs and symptoms of depression related to chronic disease state;  Assessed social determinant of health barriers;     Interaction and coordination with outside resources, practitioners, and providers See CCM Referral  Care Plan: Available in MyChart

## 2023-02-12 ENCOUNTER — Encounter: Payer: Self-pay | Admitting: Internal Medicine

## 2023-02-12 ENCOUNTER — Ambulatory Visit: Payer: Medicare HMO | Admitting: Internal Medicine

## 2023-02-12 VITALS — BP 128/82 | HR 72 | Temp 98.3°F | Ht 66.0 in | Wt 153.6 lb

## 2023-02-12 DIAGNOSIS — E1169 Type 2 diabetes mellitus with other specified complication: Secondary | ICD-10-CM | POA: Diagnosis not present

## 2023-02-12 DIAGNOSIS — E785 Hyperlipidemia, unspecified: Secondary | ICD-10-CM

## 2023-02-12 DIAGNOSIS — I119 Hypertensive heart disease without heart failure: Secondary | ICD-10-CM | POA: Diagnosis not present

## 2023-02-12 DIAGNOSIS — Z0000 Encounter for general adult medical examination without abnormal findings: Secondary | ICD-10-CM

## 2023-02-12 DIAGNOSIS — I700 Atherosclerosis of aorta: Secondary | ICD-10-CM

## 2023-02-12 DIAGNOSIS — E1165 Type 2 diabetes mellitus with hyperglycemia: Secondary | ICD-10-CM | POA: Diagnosis not present

## 2023-02-12 DIAGNOSIS — C3491 Malignant neoplasm of unspecified part of right bronchus or lung: Secondary | ICD-10-CM | POA: Diagnosis not present

## 2023-02-12 LAB — POCT URINALYSIS DIPSTICK
Bilirubin, UA: NEGATIVE
Blood, UA: NEGATIVE
Glucose, UA: NEGATIVE
Ketones, UA: NEGATIVE
Leukocytes, UA: NEGATIVE
Nitrite, UA: NEGATIVE
Protein, UA: NEGATIVE
Spec Grav, UA: 1.02 (ref 1.010–1.025)
Urobilinogen, UA: 0.2 E.U./dL
pH, UA: 7 (ref 5.0–8.0)

## 2023-02-12 NOTE — Patient Instructions (Signed)

## 2023-02-12 NOTE — Progress Notes (Signed)
I,[name],acting as a scribe for Tammy Greenland, MD.,have documented all relevant documentation on the behalf of Tammy Greenland, MD,as directed by  Tammy Greenland, MD while in the presence of Tammy Greenland, MD.   Subjective:     Patient ID: [name] , female    DOB: [birth year] , 77 y.o.   MRN: [id number]   Chief Complaint  Patient presents with   Annual Exam   Diabetes   Hypertension    HPI  Patient presents today for HM.  She is no longer followed by GYN. She reports compliance with meds. Denies headaches, chest pain and shortness of breath. She admits she is not checking her sugars regularly.   Patient states she has Mammogram soon. She is followed by Dr Katy Fitch for eye exams.    Hyperlipidemia This is a chronic problem. The current episode started more than 1 year ago. The problem is uncontrolled. Exacerbating diseases include diabetes. Pertinent negatives include no chest pain or shortness of breath. Current antihyperlipidemic treatment includes statins. The current treatment provides moderate improvement of lipids. Risk factors for coronary artery disease include diabetes mellitus, hypertension, post-menopausal and dyslipidemia.  Diabetes She presents for her follow-up diabetic visit. She has type 2 diabetes mellitus. Her disease course has been stable. There are no hypoglycemic associated symptoms. Pertinent negatives for hypoglycemia include no dizziness or headaches. Pertinent negatives for diabetes include no blurred vision, no chest pain, no polydipsia, no polyphagia and no polyuria. There are no hypoglycemic complications. Risk factors for coronary artery disease include diabetes mellitus, dyslipidemia, hypertension, obesity, sedentary lifestyle and post-menopausal. She has not had a previous visit with a dietitian. She participates in exercise three times a week. Her breakfast blood glucose is taken between 8-9 am. Her breakfast blood glucose range is  generally 110-130 mg/dl. Eye exam is current.  Hypertension This is a chronic problem. The current episode started more than 1 year ago. The problem has been gradually improving since onset. The problem is controlled. Pertinent negatives include no blurred vision, chest pain, headaches, palpitations or shortness of breath. The current treatment provides moderate improvement. Compliance problems include exercise.      Past Medical History:  Diagnosis Date   Arthritis    Asthma    Complication of anesthesia    COPD (chronic obstructive pulmonary disease) (HCC)    Diabetes ([place])    High cholesterol    History of radiation therapy    Right Lung- 12/04/21-12/11/21- Dr. Gery Pray   Hypertension    Lung cancer [facility])    Pneumonia 09/10/2017   PONV (postoperative nausea and vomiting)    Stroke ([place]) 2007   mini strokes     Family History  Problem Relation Age of Onset   Heart murmur Mother    Heart attack Father    Diabetes Father    Diabetes Brother      Current Outpatient Medications:    albuterol (VENTOLIN HFA) 108 (90 Base) MCG/ACT inhaler, INHALE 2 PUFFS INTO THE LUNGS EVERY 4 HOURS AS NEEDED FOR WHEEZING OR SHORTNESS OF BREATH, Disp: 18 g, Rfl: 12   aspirin 81 MG tablet, Take 81 mg by mouth daily., Disp: , Rfl:    atorvastatin (LIPITOR) 80 MG tablet, Take one tab po M-F and skip the weekends, Disp: 90 tablet, Rfl: 1   Biotin 10000 MCG TBDP, Take 10,000 mcg by mouth daily., Disp: , Rfl:    bisoprolol (ZEBETA) 5 MG tablet, TAKE 1/2 TABLET BY MOUTH EVERY  DAY, Disp: 45 tablet, Rfl: 3   Budeson-Glycopyrrol-Formoterol (BREZTRI AEROSPHERE) 160-9-4.8 MCG/ACT AERO, Inhale 2 puffs into the lungs 2 (two) times daily., Disp: 10.7 g, Rfl: 6   CALCIUM-MAGNESIUM-ZINC PO, Take 1 capsule by mouth at bedtime., Disp: , Rfl:    Cholecalciferol (VITAMIN D3) 2000 UNITS TABS, Take 2,000 Units by mouth at bedtime., Disp: , Rfl:    DULoxetine (CYMBALTA) 60 MG capsule, TAKE 1 CAPSULE(60 MG) BY MOUTH  DAILY, Disp: 90 capsule, Rfl: 1   famotidine (PEPCID) 20 MG tablet, One after supper, Disp: 90 tablet, Rfl: 1   glucose blood (ACCU-CHEK GUIDE) test strip, 1 each by Other route daily. Use as instructed to check blood sugars 1 time per day dx: e11.22, Disp: 50 each, Rfl: 11   ipratropium-albuterol (DUONEB) 0.5-2.5 (3) MG/3ML SOLN, USE 3 ML VIA NEBULIZER EVERY 6 HOURS AS NEEDED, Disp: 180 mL, Rfl: 1   Magnesium 250 MG TABS, Take 1 tablet by mouth daily., Disp: , Rfl:    montelukast (SINGULAIR) 10 MG tablet, TAKE 1 TABLET(10 MG) BY MOUTH AT BEDTIME, Disp: 90 tablet, Rfl: 3   oxyCODONE-acetaminophen (PERCOCET) 10-325 MG tablet, Take 1 tablet by mouth 4 (four) times daily as needed., Disp: , Rfl:    pantoprazole (PROTONIX) 40 MG tablet, TAKE 1 TABLET BY MOUTH 30 MINUTES BEFORE FIRST MEAL OF THE DAY, Disp: 90 tablet, Rfl: 2   pregabalin (LYRICA) 100 MG capsule, Take 100 mg by mouth 3 (three) times daily., Disp: , Rfl:    Semaglutide, 1 MG/DOSE, (OZEMPIC, 1 MG/DOSE,) 4 MG/3ML SOPN, Inject 1 mg into the skin once a week. Friday, Disp: 3 mL, Rfl: 3   predniSONE (DELTASONE) 10 MG tablet, Take 1 tablet (10 mg total) by mouth daily. Take 4 pills daily for 3 days then 3 pills daily for 3 days then 2 pills daily for 3 days then 1 pill daily for 3 days then stop (Patient not taking: Reported on 02/12/2023), Disp: 30 tablet, Rfl: 0   Allergies  Allergen Reactions   Other Swelling    HAIR DYE      The patient states she uses post menopausal status for birth control. Last LMP was No LMP recorded. Patient has had a hysterectomy.. Negative for Dysmenorrhea. Negative for: breast discharge, breast lump(s), breast pain and breast self exam. Associated symptoms include abnormal vaginal bleeding. Pertinent negatives include abnormal bleeding (hematology), anxiety, decreased libido, depression, difficulty falling sleep, dyspareunia, history of infertility, nocturia, sexual dysfunction, sleep disturbances, urinary  incontinence, urinary urgency, vaginal discharge and vaginal itching. Diet regular.The patient states her exercise level is    . The patient's tobacco use is:  Social History   Tobacco Use  Smoking Status Former   Packs/day: 0.50   Years: 42.00   Total pack years: 21.00   Types: Cigarettes   Start date: 01/30/1967   Quit date: 01/30/2006   Years since quitting: 17.0  Smokeless Tobacco Never  Tobacco Comments   intermittent cycles b/w 1/4-1/2ppd  . She has been exposed to passive smoke. The patient's alcohol use is:  Social History   Substance and Sexual Activity  Alcohol Use Not Currently   Comment: 1 drink a year, maybe    Review of Systems  Constitutional: Negative.   HENT: Negative.    Eyes: Negative.  Negative for blurred vision.  Respiratory: Negative.  Negative for shortness of breath.   Cardiovascular: Negative.  Negative for chest pain and palpitations.  Gastrointestinal: Negative.   Endocrine: Negative.  Negative for polydipsia, polyphagia  and polyuria.  Genitourinary: Negative.   Musculoskeletal: Negative.   Skin: Negative.   Allergic/Immunologic: Negative.   Neurological: Negative.  Negative for dizziness and headaches.  Hematological: Negative.   Psychiatric/Behavioral: Negative.       Today's Vitals   02/12/23 1518  BP: 128/82  Pulse: 72  Temp: 98.3 F (36.8 C)  SpO2: 98%  Weight: 153 lb 9.6 oz (69.7 kg)  Height: '5\' 6"'$  (1.676 m)   Body mass index is 24.79 kg/m.  Wt Readings from Last 3 Encounters:  02/12/23 153 lb 9.6 oz (69.7 kg)  12/12/22 145 lb 6.4 oz (66 kg)  10/03/22 155 lb (70.3 kg)    Objective:  Physical Exam Vitals and nursing note reviewed.  Constitutional:      Appearance: Normal appearance.  HENT:     Head: Normocephalic and atraumatic.     Right Ear: Tympanic membrane, ear canal and external ear normal.     Left Ear: Tympanic membrane, ear canal and external ear normal.     Nose:     Comments: Masked     Mouth/Throat:      Comments: Masked  Eyes:     Extraocular Movements: Extraocular movements intact.     Conjunctiva/sclera: Conjunctivae normal.     Pupils: Pupils are equal, round, and reactive to light.  Cardiovascular:     Rate and Rhythm: Normal rate and regular rhythm.     Pulses: Normal pulses.          Dorsalis pedis pulses are 2+ on the right side and 2+ on the left side.     Heart sounds: Normal heart sounds.  Pulmonary:     Effort: Pulmonary effort is normal.     Breath sounds: Rales present.  Chest:  Breasts:    Tanner Score is 5.     Right: Normal.     Left: Normal.  Abdominal:     General: Bowel sounds are normal.     Palpations: Abdomen is soft.  Genitourinary:    Comments: deferred Musculoskeletal:        General: Normal range of motion.     Cervical back: Normal range of motion and neck supple.  Feet:     Right foot:     Protective Sensation: 5 sites tested.  5 sites sensed.     Skin integrity: Dry skin present.     Toenail Condition: Right toenails are normal.     Left foot:     Protective Sensation: 5 sites tested.  5 sites sensed.     Skin integrity: Dry skin present.     Toenail Condition: Left toenails are normal.  Skin:    General: Skin is warm and dry.  Neurological:     General: No focal deficit present.     Mental Status: She is alert and oriented to person, place, and time.  Psychiatric:        Mood and Affect: Mood normal.        Behavior: Behavior normal.       Assessment And Plan:     1. Encounter for general adult medical examination w/o abnormal findings Comments: A full exam was performed. She is encouraged to perform monthly SBE. She is UTD w/ CRC screening and mammography.  PATIENT IS ADVISED TO GET 30-45 MINUTES REGULAR EXERCISE NO LESS THAN FOUR TO FIVE DAYS PER WEEK - BOTH WEIGHTBEARING EXERCISES AND AEROBIC ARE RECOMMENDED.  PATIENT IS ADVISED TO FOLLOW A HEALTHY DIET WITH AT LEAST SIX FRUITS/VEGGIES PER  DAY, DECREASE INTAKE OF RED MEAT, AND TO  INCREASE FISH INTAKE TO TWO DAYS PER WEEK.  MEATS/FISH SHOULD NOT BE FRIED, BAKED OR BROILED IS PREFERABLE.  IT IS ALSO IMPORTANT TO CUT BACK ON YOUR SUGAR INTAKE. PLEASE AVOID ANYTHING WITH ADDED SUGAR, CORN SYRUP OR OTHER SWEETENERS. IF YOU MUST USE A SWEETENER, YOU CAN TRY STEVIA. IT IS ALSO IMPORTANT TO AVOID ARTIFICIALLY SWEETENERS AND DIET BEVERAGES. LASTLY, I SUGGEST WEARING SPF 50 SUNSCREEN ON EXPOSED PARTS AND ESPECIALLY WHEN IN THE DIRECT SUNLIGHT FOR AN EXTENDED PERIOD OF TIME.  PLEASE AVOID FAST FOOD RESTAURANTS AND INCREASE YOUR WATER INTAKE.  2. Dyslipidemia associated with type 2 diabetes mellitus ([place]) Comments: Chronic, diabetic foot exam was performed. She will c/w weekly semaglutide. She will rto in 4 months for re-evaluation.  I DISCUSSED WITH THE PATIENT AT LENGTH REGARDING THE GOALS OF GLYCEMIC CONTROL AND POSSIBLE LONG-TERM COMPLICATIONS.  I  ALSO STRESSED THE IMPORTANCE OF COMPLIANCE WITH HOME GLUCOSE MONITORING, DIETARY RESTRICTIONS INCLUDING AVOIDANCE OF SUGARY DRINKS/PROCESSED FOODS,  ALONG WITH REGULAR EXERCISE.  I  ALSO STRESSED THE IMPORTANCE OF ANNUAL EYE EXAMS, SELF FOOT CARE AND COMPLIANCE WITH OFFICE VISITS.  - POCT Urinalysis Dipstick (81002) - Microalbumin / creatinine urine ratio - CMP14+EGFR - CBC - Hemoglobin A1c  3. 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 EKG performed, NSR w/ RAE and nonspecific T abnormality. She will c/w bisoprolol '5mg'$  daily, encouraged to follow low sodium diet.  - POCT Urinalysis Dipstick (81002) - Microalbumin / creatinine urine ratio - EKG 12-Lead  4. Atherosclerosis of aorta (HCC) Comments: Chronic, LDL goal < 70. She will c/w atorvastatin '80mg'$  and ASA '81mg'$  daily.  She is encouraged to follow heart healthy lifestyle.  5. Adenocarcinoma, lung, right ([place]) Comments: She is s/p XRT therapy. Radiation Oncology notes reviewed in detail.  Patient was given opportunity to ask questions. Patient verbalized  understanding of the plan and was able to repeat key elements of the plan. All questions were answered to their satisfaction.   I, Tammy Greenland, MD, have reviewed all documentation for this visit. The documentation on 02/12/23 for the exam, diagnosis, procedures, and orders are all accurate and complete.   THE PATIENT IS ENCOURAGED TO PRACTICE SOCIAL DISTANCING DUE TO THE COVID-19 PANDEMIC.

## 2023-02-13 LAB — CMP14+EGFR
ALT: 11 IU/L (ref 0–32)
AST: 22 IU/L (ref 0–40)
Albumin/Globulin Ratio: 1.4 (ref 1.2–2.2)
Albumin: 3.9 g/dL (ref 3.8–4.8)
Alkaline Phosphatase: 72 IU/L (ref 44–121)
BUN/Creatinine Ratio: 9 — ABNORMAL LOW (ref 12–28)
BUN: 7 mg/dL — ABNORMAL LOW (ref 8–27)
Bilirubin Total: 0.4 mg/dL (ref 0.0–1.2)
CO2: 28 mmol/L (ref 20–29)
Calcium: 9.6 mg/dL (ref 8.7–10.3)
Chloride: 102 mmol/L (ref 96–106)
Creatinine, Ser: 0.8 mg/dL (ref 0.57–1.00)
Globulin, Total: 2.8 g/dL (ref 1.5–4.5)
Glucose: 70 mg/dL (ref 70–99)
Potassium: 4.3 mmol/L (ref 3.5–5.2)
Sodium: 140 mmol/L (ref 134–144)
Total Protein: 6.7 g/dL (ref 6.0–8.5)
eGFR: 77 mL/min/{1.73_m2} (ref >59)

## 2023-02-13 LAB — CBC
Hematocrit: 39.1 % (ref 34.0–46.6)
Hemoglobin: 12.6 g/dL (ref 11.1–15.9)
MCH: 29.3 pg (ref 26.6–33.0)
MCHC: 32.2 g/dL (ref 31.5–35.7)
MCV: 91 fL (ref 79–97)
Platelets: 322 10*3/uL (ref 150–450)
RBC: 4.3 x10E6/uL (ref 3.77–5.28)
RDW: 14.2 % (ref 11.7–15.4)
WBC: 4.4 10*3/uL (ref 3.4–10.8)

## 2023-02-13 LAB — MICROALBUMIN / CREATININE URINE RATIO
Creatinine, Urine: 146.8 mg/dL
Microalb/Creat Ratio: 4 mg/g creat (ref 0–29)
Microalbumin, Urine: 5.8 ug/mL

## 2023-02-13 LAB — HEMOGLOBIN A1C
Est. average glucose Bld gHb Est-mCnc: 108 mg/dL
Hgb A1c MFr Bld: 5.4 % (ref 4.8–5.6)

## 2023-02-14 DIAGNOSIS — C3491 Malignant neoplasm of unspecified part of right bronchus or lung: Secondary | ICD-10-CM | POA: Diagnosis not present

## 2023-02-14 DIAGNOSIS — Z79899 Other long term (current) drug therapy: Secondary | ICD-10-CM | POA: Diagnosis not present

## 2023-02-14 DIAGNOSIS — G629 Polyneuropathy, unspecified: Secondary | ICD-10-CM | POA: Diagnosis not present

## 2023-02-14 DIAGNOSIS — G894 Chronic pain syndrome: Secondary | ICD-10-CM | POA: Diagnosis not present

## 2023-02-22 ENCOUNTER — Other Ambulatory Visit: Payer: Self-pay | Admitting: Internal Medicine

## 2023-02-27 DIAGNOSIS — E1165 Type 2 diabetes mellitus with hyperglycemia: Secondary | ICD-10-CM | POA: Diagnosis not present

## 2023-02-27 DIAGNOSIS — I10 Essential (primary) hypertension: Secondary | ICD-10-CM

## 2023-02-28 DIAGNOSIS — Z1231 Encounter for screening mammogram for malignant neoplasm of breast: Secondary | ICD-10-CM | POA: Diagnosis not present

## 2023-02-28 LAB — HM MAMMOGRAPHY

## 2023-03-05 ENCOUNTER — Ambulatory Visit: Payer: Medicare HMO

## 2023-03-05 VITALS — BP 106/50 | HR 76 | Temp 97.9°F | Ht 65.6 in | Wt 149.4 lb

## 2023-03-05 DIAGNOSIS — Z0000 Encounter for general adult medical examination without abnormal findings: Secondary | ICD-10-CM

## 2023-03-05 NOTE — Progress Notes (Signed)
Subjective:   [name] is a 77 y.o. female who presents for Medicare Annual (Subsequent) preventive examination.  Review of Systems     Cardiac Risk Factors include: advanced age (>57mn, >>73women);diabetes mellitus;dyslipidemia;hypertension     Objective:    Today's Vitals   03/05/23 0946 03/05/23 1007  BP: (!) 100/50 (!) 106/50  Pulse: 76   Temp: 97.9 F (36.6 C)   TempSrc: Oral   Weight: 149 lb 6.4 oz (67.8 kg)   Height: 5' 5.6" (1.666 m)    Body mass index is 24.41 kg/m.     03/05/2023    9:55 AM 12/12/2022    7:30 AM 10/03/2022   11:09 AM 04/30/2022   11:44 AM 03/29/2022    2:42 PM 01/21/2022    4:10 PM 10/31/2021    9:58 AM  Advanced Directives  Does Patient Have a Medical Advance Directive? No No No No No No No  Does patient want to make changes to medical advance directive?     No - Patient declined    Would patient like information on creating a medical advance directive? No - Patient declined No - Patient declined No - Patient declined No - Patient declined  No - Patient declined Yes (MAU/Ambulatory/Procedural Areas - Information given)    Current Medications (verified) Outpatient Encounter Medications as of 03/05/2023  Medication Sig   albuterol (VENTOLIN HFA) 108 (90 Base) MCG/ACT inhaler INHALE 2 PUFFS INTO THE LUNGS EVERY 4 HOURS AS NEEDED FOR WHEEZING OR SHORTNESS OF BREATH   aspirin 81 MG tablet Take 81 mg by mouth daily.   atorvastatin (LIPITOR) 80 MG tablet TAKE 1 TABLET BY MOUTH DAILY MONDAY-FRIDAY AND SKIP WEEKENDS   Biotin 10000 MCG TBDP Take 10,000 mcg by mouth daily.   bisoprolol (ZEBETA) 5 MG tablet TAKE 1/2 TABLET BY MOUTH EVERY DAY   Budeson-Glycopyrrol-Formoterol (BREZTRI AEROSPHERE) 160-9-4.8 MCG/ACT AERO Inhale 2 puffs into the lungs 2 (two) times daily.   CALCIUM-MAGNESIUM-ZINC PO Take 1 capsule by mouth at bedtime.   Cholecalciferol (VITAMIN D3) 2000 UNITS TABS Take 2,000 Units by mouth at bedtime.   DULoxetine (CYMBALTA) 60 MG  capsule TAKE 1 CAPSULE(60 MG) BY MOUTH DAILY   famotidine (PEPCID) 20 MG tablet One after supper   glucose blood (ACCU-CHEK GUIDE) test strip 1 each by Other route daily. Use as instructed to check blood sugars 1 time per day dx: e11.22   ipratropium-albuterol (DUONEB) 0.5-2.5 (3) MG/3ML SOLN USE 3 ML VIA NEBULIZER EVERY 6 HOURS AS NEEDED   Magnesium 250 MG TABS Take 1 tablet by mouth daily.   montelukast (SINGULAIR) 10 MG tablet TAKE 1 TABLET(10 MG) BY MOUTH AT BEDTIME   oxyCODONE-acetaminophen (PERCOCET) 10-325 MG tablet Take 1 tablet by mouth 4 (four) times daily as needed.   pantoprazole (PROTONIX) 40 MG tablet TAKE 1 TABLET BY MOUTH 30 MINUTES BEFORE FIRST MEAL OF THE DAY   pregabalin (LYRICA) 100 MG capsule Take 100 mg by mouth 3 (three) times daily.   Semaglutide, 1 MG/DOSE, (OZEMPIC, 1 MG/DOSE,) 4 MG/3ML SOPN Inject 1 mg into the skin once a week. Friday   predniSONE (DELTASONE) 10 MG tablet Take 1 tablet (10 mg total) by mouth daily. Take 4 pills daily for 3 days then 3 pills daily for 3 days then 2 pills daily for 3 days then 1 pill daily for 3 days then stop (Patient not taking: Reported on 02/12/2023)   No facility-administered encounter medications on file as of 03/05/2023.    Allergies (  verified) Other   History: Past Medical History:  Diagnosis Date   Arthritis    Asthma    Complication of anesthesia    COPD (chronic obstructive pulmonary disease) ([place])    Diabetes ([place])    High cholesterol    History of radiation therapy    Right Lung- 12/04/21-12/11/21- Dr. Gery Pray   Hypertension    Lung cancer ([place])    Pneumonia 09/10/2017   PONV (postoperative nausea and vomiting)    Stroke [facility]) 2007   mini strokes   Past Surgical History:  Procedure Laterality Date   ABDOMINAL HYSTERECTOMY  1995   BRONCHIAL BIOPSY  10/23/2021   Procedure: BRONCHIAL BIOPSIES;  Surgeon: Garner Nash, DO;  Location: [facility];  Service: Pulmonary;;   BRONCHIAL BRUSHINGS  10/23/2021    Procedure: BRONCHIAL BRUSHINGS;  Surgeon: Garner Nash, DO;  Location: [facility];  Service: Pulmonary;;   BRONCHIAL NEEDLE ASPIRATION BIOPSY  10/23/2021   Procedure: BRONCHIAL NEEDLE ASPIRATION BIOPSIES;  Surgeon: Garner Nash, DO;  Location: [place] [facility]OSCOPY;  Service: Pulmonary;;   carpel tunnel surgery Right 2006   COLONOSCOPY  2014   COLOSTOMY  2007   colostomy let down  2008   endoscopy  2014   FIDUCIAL MARKER PLACEMENT  10/23/2021   Procedure: FIDUCIAL MARKER PLACEMENT;  Surgeon: Garner Nash, DO;  Location: [place] [facility]COPY;  Service: Pulmonary;;   rotator cuff tear Left 04/14/2020   VIDEO BRONCHOSCOPY WITH ENDOBRONCHIAL NAVIGATION Right 10/23/2021   Procedure: VIDEO BRONCHOSCOPY WITH ENDOBRONCHIAL NAVIGATION;  Surgeon: Garner Nash, DO;  Location: [facility];  Service: Pulmonary;  Laterality: Right;  ION, w/ fiducial placement   VIDEO BRONCHOSCOPY WITH RADIAL ENDOBRONCHIAL ULTRASOUND  10/23/2021   Procedure: VIDEO BRONCHOSCOPY WITH RADIAL ENDOBRONCHIAL ULTRASOUND;  Surgeon: Garner Nash, DO;  Location: [facility] ENDOSCOPY;  Service: Pulmonary;;   Family History  Problem Relation Age of Onset   Heart murmur Mother    Heart attack Father    Diabetes Father    Diabetes Brother    Social History   Socioeconomic History   Marital status: Married    Spouse name: Not on file   Number of children: Not on file   Years of education: Not on file   Highest education level: Not on file  Occupational History   Occupation: Retired    Comment: Former Advertising copywriter to Scientist, physiological of education  Tobacco Use   Smoking status: Former    Packs/day: 0.50    Years: 42.00    Total pack years: 21.00    Types: Cigarettes    Start date: 01/30/1967    Quit date: 01/30/2006    Years since quitting: 17.1   Smokeless tobacco: Never   Tobacco comments:    intermittent cycles b/w 1/4-1/2ppd  Vaping Use   Vaping Use: Never used  Substance and Sexual Activity   Alcohol use: Not Currently     Comment: 1 drink a year, maybe   Drug use: No   Sexual activity: Not Currently  Other Topics Concern   Not on file  Social History Narrative   Not on file   Social Determinants of Health   Financial Resource Strain: Low Risk  (03/05/2023)   Overall Financial Resource Strain (CARDIA)    Difficulty of Paying Living Expenses: Not hard at all  Food Insecurity: No Food Insecurity (03/05/2023)   Hunger Vital Sign    Worried About Running Out of Food in the Last Year: Never true    Ran Out of  Food in the Last Year: Never true  Transportation Needs: No Transportation Needs (03/05/2023)   PRAPARE - Hydrologist (Medical): No    Lack of Transportation (Non-Medical): No  Physical Activity: Insufficiently Active (03/05/2023)   Exercise Vital Sign    Days of Exercise per Week: 4 days    Minutes of Exercise per Session: 30 min  Stress: No Stress Concern Present (03/05/2023)   [place]    Feeling of Stress : Not at all  Social Connections: [place] (02/07/2023)   Social Connection and Isolation Panel [NHANES]    Frequency of Communication with Friends and Family: More than three times a week    Frequency of Social Gatherings with Friends and Family: More than three times a week    Attends Religious Services: More than 4 times per year    Active Member of [employer] or Organizations: Yes    Attends Music therapist: More than 4 times per year    Marital Status: Married    Tobacco Counseling Counseling given: Not Answered Tobacco comments: intermittent cycles b/w 1/4-1/2ppd   Clinical Intake:  Pre-visit preparation completed: Yes  Pain : No/denies pain     Nutritional Status: BMI of 19-24  Normal Nutritional Risks: None Diabetes: Yes  How often do you need to have someone help you when you read instructions, pamphlets, or other written materials from your doctor or  pharmacy?: 1 - Never  Diabetic? Yes Nutrition Risk Assessment:  Has the patient had any N/V/D within the last 2 months?  No  Does the patient have any non-healing wounds?  No  Has the patient had any unintentional weight loss or weight gain?  No   Diabetes:  Is the patient diabetic?  Yes  If diabetic, was a CBG obtained today?  No  Did the patient bring in their glucometer from home?  No  How often do you monitor your CBG's? Does not.   Financial Strains and Diabetes Management:  Are you having any financial strains with the device, your supplies or your medication? No .  Does the patient want to be seen by Chronic Care Management for management of their diabetes?  No  Would the patient like to be referred to a Nutritionist or for Diabetic Management?  No   Diabetic Exams:  Diabetic Eye Exam: Completed 2023 Diabetic Foot Exam: Completed 02/12/2023   Interpreter Needed?: No  Information entered by :: NAllen LPN   Activities of Daily Living    03/05/2023    9:56 AM 12/12/2022    7:30 AM  In your present state of health, do you have any difficulty performing the following activities:  Hearing? 0 0  Vision? 0 0  Difficulty concentrating or making decisions? 1 0  Comment trouble with memory   Walking or climbing stairs? 0 0  Dressing or bathing? 0 0  Doing errands, shopping? 0 0  Preparing Food and eating ? N   Using the Toilet? N   In the past six months, have you accidently leaked urine? Y   Do you have problems with loss of bowel control? N   Managing your Medications? N   Managing your Finances? N   Housekeeping or managing your Housekeeping? N     Patient Care Team: [name], MD as PCP - General (Internal Medicine) Mayford Knife, [facility] (Pharmacist) Vanita Ingles, RN as Case Manager (General Practice)  Indicate any recent  Medical Services you may have received from other than [facility] providers in the past year (date may be approximate).     Assessment:    This is a routine wellness examination for [place].  Hearing/Vision screen Vision Screening - Comments:: Regular eye exams, [facility]  Dietary issues and exercise activities discussed: Current Exercise Habits: Home exercise routine, Type of exercise: walking, Time (Minutes): 30, Frequency (Times/Week): 4, Weekly Exercise (Minutes/Week): 120   Goals Addressed             This Visit's Progress    Patient Stated       03/05/2023, no goals       Depression Screen    03/05/2023    9:56 AM 02/12/2023    3:20 PM 10/03/2022    3:26 PM 09/12/2021    2:21 PM 09/06/2020   10:44 AM 09/01/2019    9:28 AM 07/01/2019   10:54 AM  PHQ 2/9 Scores  PHQ - 2 Score 0 0 0 0 0 3 0  PHQ- 9 Score      3     Fall Risk    03/05/2023    9:55 AM 02/12/2023    3:19 PM 02/07/2023   10:51 AM 10/03/2022    3:26 PM 09/12/2021    2:21 PM  [place] in the past year? 0 0 0 0 0  Number falls in past yr: 0 0 0 0   Injury with Fall? 0 0 0 0   Risk for fall due to : Medication side effect No Fall Risks No Fall Risks No Fall Risks Medication side effect  Follow up Falls prevention discussed;Education provided;Falls evaluation completed Falls evaluation completed Falls evaluation completed;Education provided Falls evaluation completed Falls evaluation completed;Education provided;Falls prevention discussed    FALL RISK PREVENTION PERTAINING TO THE HOME:  Any stairs in or around the home? Yes  If so, are there any without handrails? No  Home free of loose throw rugs in walkways, pet beds, electrical cords, etc? Yes  Adequate lighting in your home to reduce risk of falls? Yes   ASSISTIVE DEVICES UTILIZED TO PREVENT FALLS:  Life alert? No  Use of a cane, walker or w/c? No  Grab bars in the bathroom? No  Shower chair or bench in shower? No  Elevated toilet seat or a handicapped toilet? No   TIMED UP AND GO:  Was the test performed? Yes .  Length of time to ambulate 10 feet: 5 sec.   Gait steady  and fast without use of assistive device  Cognitive Function:        03/05/2023    9:57 AM 09/12/2021    2:23 PM 09/06/2020   10:46 AM 09/01/2019    9:32 AM  6CIT Screen  What Year? 0 points 0 points 0 points 0 points  What month? 0 points 0 points 0 points 0 points  What time? 0 points 0 points 0 points 0 points  Count back from 20 0 points 0 points 0 points 0 points  Months in reverse 0 points 0 points 0 points 0 points  Repeat phrase 2 points 2 points 0 points 0 points  Total Score 2 points 2 points 0 points 0 points    Immunizations Immunization History  Administered Date(s) Administered   Fluad Quad(high Dose 65+) 09/06/2020, 09/12/2021, 10/03/2022   Influenza Split 09/29/2013, 09/30/2015   Influenza Whole 08/30/2016   Influenza, High Dose Seasonal PF 09/29/2017, 09/14/2018, 09/01/2019   Influenza,inj,Quad PF,6+  Mos 09/12/2014   Influenza-Unspecified 09/14/2018   PFIZER(Purple Top)SARS-COV-2 Vaccination 01/19/2020, 02/09/2020, 12/21/2020, 05/16/2022   Pneumococcal Conjugate-13 01/06/2020   Pneumococcal Polysaccharide-23 10/07/2013   Pneumococcal-Unspecified 10/05/2013   Tdap 06/07/2019, 06/09/2019   Zoster Recombinat (Shingrix) 07/26/2020, 10/09/2020    TDAP status: Up to date  Flu Vaccine status: Up to date  Pneumococcal vaccine status: Up to date  Covid-19 vaccine status: Completed vaccines  Qualifies for Shingles Vaccine? Yes   Zostavax completed Yes   Shingrix Completed?: Yes  Screening Tests Health Maintenance  Topic Date Due   COVID-19 Vaccine (5 - 2023-24 season) 08/30/2022   OPHTHALMOLOGY EXAM  12/06/2022   MAMMOGRAM  02/15/2023   HEMOGLOBIN A1C  08/13/2023   Diabetic kidney evaluation - eGFR measurement  02/13/2024   Diabetic kidney evaluation - Urine ACR  02/13/2024   FOOT EXAM  02/13/2024   Medicare Annual Wellness (AWV)  03/04/2024   DTaP/Tdap/Td (3 - Td or Tdap) 06/08/2029   COLONOSCOPY (Pts 45-67yr Insurance coverage will need to be  confirmed)  08/26/2032   Pneumonia Vaccine 77 Years old  Completed   INFLUENZA VACCINE  Completed   DEXA SCAN  Completed   Hepatitis C Screening  Completed   Zoster Vaccines- Shingrix  Completed   HPV VACCINES  Aged Out    Health Maintenance  Health Maintenance Due  Topic Date Due   COVID-19 Vaccine (5 - 2023-24 season) 08/30/2022   OPHTHALMOLOGY EXAM  12/06/2022   MAMMOGRAM  02/15/2023    Colorectal cancer screening: Type of screening: Colonoscopy. Completed 08/26/2022. Repeat every 10 years  Mammogram status: Completed 02/28/2023. Repeat every year  Bone Density status: Completed 03/02/2015 Lung Cancer Screening: (Low Dose CT Chest recommended if Age 76-80years, 30 pack-year currently smoking OR have quit w/in 15years.) does not qualify.   Lung Cancer Screening Referral: no  Additional Screening:  Hepatitis C Screening: does qualify; Completed 05/19/2013  Vision Screening: Recommended annual ophthalmology exams for early detection of glaucoma and other disorders of the eye. Is the patient up to date with their annual eye exam?  Yes  Who is the provider or what is the name of the office in which the patient attends annual eye exams? [facility] If pt is not established with a provider, would they like to be referred to a provider to establish care? No .   Dental Screening: Recommended annual dental exams for proper oral hygiene  Community Resource Referral / Chronic Care Management: CRR required this visit?  No   CCM required this visit?  No      Plan:     I have personally reviewed and noted the following in the patient's chart:   Medical and social history Use of alcohol, tobacco or illicit drugs  Current medications and supplements including opioid prescriptions. Patient is currently taking opioid prescriptions. Information provided to patient regarding non-opioid alternatives. Patient advised to discuss non-opioid treatment plan with their provider. Functional  ability and status Nutritional status Physical activity Advanced directives List of other physicians Hospitalizations, surgeries, and ER visits in previous 12 months Vitals Screenings to include cognitive, depression, and falls Referrals and appointments  In addition, I have reviewed and discussed with patient certain preventive protocols, quality metrics, and best practice recommendations. A written personalized care plan for preventive services as well as general preventive health recommendations were provided to patient.     NKellie Simmering LPN   [id number]  Nurse Notes: none

## 2023-03-05 NOTE — Patient Instructions (Signed)
[name] , Thank you for taking time to come for your Medicare Wellness Visit. I appreciate your ongoing commitment to your health goals. Please review the following plan we discussed and let me know if I can assist you in the future.   These are the goals we discussed:  Goals       CCM Expected Outcome:  Monitor, Self-Manage and Reduce Symptoms of Diabetes      Current Barriers:  Knowledge Deficits related to the importance of checking blood sugars on a regular basis and the benefits of following a heart healthy/ADA diet Care Coordination needs related to pharm D support with effective medication management  in a patient with DM Chronic Disease Management support and education needs related to effective management of DM  Planned Interventions: Provided education to patient about basic DM disease process; Reviewed medications with patient and discussed importance of medication adherence;        Reviewed prescribed diet with patient heart healthy/ADA diet. Will send information through my Chart for information on eating healthy and following dietary restrictions; Counseled on importance of regular laboratory monitoring as prescribed;        Discussed plans with patient for ongoing care management follow up and provided patient with direct contact information for care management team;      Provided patient with written educational materials related to hypo and hyperglycemia and importance of correct treatment. Education provided on the sx and sx of hypo and hyperglycemia. Will send information by my Chart for education and ongoing support of effective management of DM;       Reviewed scheduled/upcoming provider appointments including: no upcoming appointments with pcp, reminder provided to call for needs or changes. Has upcoming appointment with the pharm D on 03-21-2023- reminder given today;         Advised patient, providing education and rationale, to check cbg once daily, when you have  symptoms of low or high blood sugar, and encouraged the patient to take blood sugars on a regular basis for baseline status  and record        call provider for findings outside established parameters;       Referral made to pharmacy team for assistance with medications management and procurement. Has pharm D appointment on 03-21-2023;       Review of patient status, including review of consultants reports, relevant laboratory and other test results, and medications completed;       Advised patient to discuss changes in DM, questions or concerns with provider;      Screening for signs and symptoms of depression related to chronic disease state;        Assessed social determinant of health barriers;         Symptom Management: Take medications as prescribed   Attend all scheduled provider appointments Call provider office for new concerns or questions  call the Suicide and Crisis Lifeline: 988 call the [place] National Suicide Prevention Lifeline: 260-058-4705 or TTY: 220-126-1746 TTY 631-717-7498) to talk to a trained counselor call 1-800-273-TALK (toll free, 24 hour hotline) go to [facility] [address] [place] [phone]) if experiencing a Mental Health or [place]  keep appointment with eye doctor check blood sugar at prescribed times: once daily and when you have symptoms of low or high blood sugar check feet daily for cuts, sores or redness trim toenails straight across wash and dry feet carefully every day wear comfortable, cotton socks wear comfortable, well-fitting shoes  Follow Up Plan: Telephone follow up appointment with care management team member scheduled for: 04-04-2023 at 61 am        CCM Expected Outcome:  Monitor, Self-Manage, and Reduce Symptoms of Hypertension      Current Barriers:  Chronic Disease Management support and education needs related to effective management of HTN  Planned  Interventions: Evaluation of current treatment plan related to hypertension self management and patient's adherence to plan as established by provider;   Provided education to patient re: stroke prevention, s/s of heart attack and stroke; Reviewed prescribed diet heart healthy/ADA diet Reviewed medications with patient and discussed importance of compliance;  Discussed plans with patient for ongoing care management follow up and provided patient with direct contact information for care management team; Advised patient, providing education and rationale, to monitor blood pressure daily and record, calling PCP for findings outside established parameters;  Reviewed scheduled/upcoming provider appointments including: does not have any upcoming appointments with the pcp. Knows to call for changes. Advised patient to discuss changes in HTN or heart health with provider; Provided education on prescribed diet heart healthy/ADA diet. Education provided and will send information by myChart ;  Discussed complications of poorly controlled blood pressure such as heart disease, stroke, circulatory complications, vision complications, kidney impairment, sexual dysfunction;  Screening for signs and symptoms of depression related to chronic disease state;  Assessed social determinant of health barriers;   Symptom Management: Take medications as prescribed   Attend all scheduled provider appointments Call provider office for new concerns or questions  call the Suicide and Crisis Lifeline: 988 call the [place] National Suicide Prevention Lifeline: (954)559-8952 or TTY: (336) 456-6291 TTY 804-090-4634) to talk to a trained counselor call 1-800-273-TALK (toll free, 24 hour hotline) if experiencing a Mental Health or [place]  check blood pressure weekly learn about high blood pressure call doctor for signs and symptoms of high blood pressure develop an action plan for high blood pressure keep all  doctor appointments take medications for blood pressure exactly as prescribed report new symptoms to your doctor  Follow Up Plan: Telephone follow up appointment with care management team member scheduled for: 04-04-2023 at 76 am        COPD Management      CARE PLAN ENTRY  Current Barriers:  Chronic Disease Management support, education, and care coordination needs related to COPD  Pharmacist Clinical Goal(s):  Over the next 90 days patient will continue to focus on medication adherence  Interventions: Comprehensive medication review performed.  Patient Self Care Activities:  Over the next 90 days patient will continue to use medications daily as directed Patient will utilize rescue inhaler or nebulizer solution as needed for exacerbations  Initial goal documentation       Diabetes: Goal A1c <7%      CARE PLAN ENTRY  Current Barriers:  Diabetes: controlled; complicated by chronic medical conditions including hypertension and high cholesterol Lab Results  Component Value Date   HGBA1C 6.2 (H) 05/04/2020   Lab Results  Component Value Date   CREATININE 0.76 05/04/2020   CREATININE 0.90 01/04/2020   CREATININE 0.99 09/01/2019  Kidney Function Lab Results  Component Value Date/Time   CREATININE 0.76 05/04/2020 11:38 AM   CREATININE 0.90 01/04/2020 10:57 AM   GFRNONAA 79 05/04/2020 11:38 AM   GFRAA 91 05/04/2020 11:38 AM  Current antihyperglycemic regimen: Ozempic '1mg'$  weekly Denies hypoglycemic symptoms, including dizziness, lightheadedness, shaking, sweating Denies hyperglycemic symptoms, including polyuria, polydipsia, polyphagia, nocturia, blurred vision, neuropathy Exercise  prior to should surgery: Walking program 15 minutes, 4-5 days per week Current blood glucose readings: 90s Cardiovascular risk reduction: Current hypertensive regimen: Bisoprolol '5mg'$ , 1/2 tablet daily Current hyperlipidemia regimen: Rosuvastatin '20mg'$  daily Current antiplatelet regimen: Aspirin  '81mg'$  daily  Pharmacist Clinical Goal(s):  Over the next 90 days, patient will work with PharmD and primary care provider to maintain A1c level  Interventions: Comprehensive medication review performed, medication list updated in electronic medical record Provided dietary and exercise recommendations [place] patient assistance program regarding status of shipment of Ozempic '1mg'$   Patient Self Care Activities:  Patient will check blood glucose if symptomatic over the next 90 days, document, and provide at future appointments  Patient will take medications as prescribed daily over the next 90 days Patient will contact provider with any episodes of hypoglycemia Patient will report any questions or concerns to provider  Patient will try to exercise for 30 minutes 5 times weekly or a total of 150 minutes weekly  Please see past updates related to this goal by clicking on the "Past Updates" button in the selected goal        High Blood Pressure: Goal <130/25mHg      CARE PLAN ENTRY  Current Barriers:  Current antihypertensive regimen: Bisoprolol '5mg'$ , 1/2 tablet by mouth daily Previous antihypertensives tried: Lisinopril, losartan, Bystolic Last practice recorded BP readings:  BP Readings from Last 3 Encounters:  08/21/20 118/70  07/19/20 140/78  06/14/20 132/84  Current home BP readings: Does not check at home Most recent eGFR/CrCl:  Component Value Date/Time   [facility] 01/04/2020 [id number] 01/04/2020 1057     Pharmacist Clinical Goal(s):  Over the next 90 days, patient will work with PharmD and providers to optimize antihypertensive regimen  Interventions: Comprehensive medication review performed; medication list updated in the electronic medical record.  Provided dietary and exercise recommendations Discussed the possibility of obtaining home blood pressure monitor through insurance for a small fee  Patient Self Care Activities:  Patient will resume  exercise as able post-surgery over the next 90 days Patient will continue to check BP if symptomatic, document, and provide at future appointments Patient will focus on medication adherence by continuing to use pill box over the next 90 days  Please see past updates related to this goal by clicking on the "Past Updates" button in the selected goal        High Cholesterol Management: Goal LDL <70      CARE PLAN ENTRY  Current Barriers:  Hyperlipidemia, complicated by diabetes Current antihyperlipidemic regimen: Rosuvastatin '20mg'$  daily No previous antihyperlipidemic medications noted Most recent lipid panel:     Component Value Date/Time   CHOL 146 05/04/2020 1138   TRIG 119 05/04/2020 1138   HDL 47 05/04/2020 1138   CHOLHDL 3.1 05/04/2020 1138   [place] 05/04/2020 1138  ASCVD risk enhancing conditions: age >>84 DM, HTN, CKD, CHF, current smoker 10-year ASCVD risk score: 26.4%  Pharmacist Clinical Goal(s):  Over the next 90 days, patient will work with PharmD and providers towards optimized antihyperlipidemic therapy  Interventions: Comprehensive medication review performed; medication list updated in electronic medical record.  Provided dietary and exercise recommendations Discussed the importance of exercise to meet cholesterol goal Discussed appropriate goals for LDL (less than 70)  Patient Self Care Activities:  Patient will focus on medication adherence by continuing to use pill box daily  Patient will continue to focus on heart healthy diet over the next 90 days Patient will  try to exercise for 30 minutes daily 5 times per week or a total of 150 minutes weekly  Please see past updates related to this goal by clicking on the "Past Updates" button in the selected goal         I would like to optimize my medication management of my chronic conditions (pt-stated)      Current Barriers:  Diabetes: T2DM; most recent A1c 6.6% on 01/04/20  Current antihyperglycemic regimen:  Ozempic '1mg'$  weekly with patient assistance program. Patient reports tolerating higher dose of Ozempic '1mg'$  weekly Will reapply for patient assistance to maintain optimal therapeutic outcomes denies hypoglycemic symptoms, denies hyperglycemic symptoms Current meal patterns: Breakfast/Lunch/Supper: encouraged patient to have protein, vegetable, low carb side Snacks: increased sweets (had honey bun yesterday evening) Drinks:mostly water, counseled patient to avoid sugary beverages, etc Current exercise: n/a Current blood glucose readings: Recent A1c was 6.6%.  Denies hypo/hyper SX.  Patient was previously monitoring blood glucose readings weekly before she injects. Encouraged patient to continue checking once weekly (or if symptomatic). Cardiovascular risk reduction: Current hypertensive regimen: bisoprolol, HCTZ Current hyperlipidemia regimen: rosuvastatin '20mg'$  qHS    COPD:  -controlled on Symbicort Twice daily  Reports 100% compliance -rescue PRN albuterol inhaler -discussed PAP for Symbicort-->will apply for patient assistance -vaccines up to date  Pharmacist Clinical Goal(s):  Over the next 90 days, patient with work with PharmD and primary care provider to address needs related to optimized medication management of chronic conditions  Interventions: Call completed with patient on 02/07/20 Comprehensive medication review performed, medication list updated in electronic medical record Reviewed & discussed the following diabetes-related information with patient: Follow ADA recommended "diabetes-friendly" diet  (reviewed healthy snack/food options) Discussed GLP-1 injection technique; Patient uses AccuCheck glucometer Reviewed medication purpose/side effects-->patient denies adverse events  Patient Self Care Activities:  Patient will check blood glucose once weekly, document, and provide at future appointments Patient will focus on medication adherence by continued compliance with  medications as prescribed. Patient will take medications as prescribed Patient will contact provider with any episodes of hypoglycemia Patient will report any questions or concerns to provider   Please see past updates related to this goal by clicking on the "Past Updates" button in the selected goal        Manage My Medicine      Timeframe:  Long-Range Goal Priority:  High Start Date:    02/06/2021                          Expected End Date:  Next Follow Up Appointment: 02/27/2022    In Progress:   - call for medicine refill 2 or 3 days before it runs out - call if I am sick and can't take my medicine - keep a list of all the medicines I take; vitamins and herbals too - use a pillbox to sort medicine    Why is this important?   These steps will help you keep on track with your medicines.          Patient Stated      09/06/2020, no goals      Patient Stated      09/12/2021, no goals      Patient Stated      03/05/2023, no goals      Pharmacy Care Plan (pt-stated)      CARE PLAN ENTRY (see longitudinal plan of care for additional care plan information)  Current Barriers:  Chronic Disease  Management support, education, and care coordination needs related to Hypertension, Hyperlipidemia, and Diabetes   Hypertension BP Readings from Last 3 Encounters:  11/21/20 130/76  10/16/20 118/70  09/06/20 122/80  Pharmacist Clinical Goal(s): Over the next 90 days, patient will work with PharmD and providers to maintain BP goal <130/80 Current regimen:  Hydrochlorothiazide 25 mg tablet - take daily  Bisoprolol 5 mg - take 1/2 tablet daily  Interventions: Patient to check BP at home  Discussed the importance of a healthy diet  Patient self care activities - Over the next 90 days, patient will: Check BP 1-3 times per week, document, and provide at future appointments Ensure daily salt intake < 2300 mg/day  Hyperlipidemia Lab Results  Component Value Date/Time   LDLCALC 78  05/04/2020 11:38 AM  Pharmacist Clinical Goal(s): Over the next 90 days, patient will work with PharmD and providers to achieve LDL goal < 70 Current regimen:  Rosuvastatin 20 mg-take daily Interventions: Discussed the importance of healthy eating The importance of medication adherence Patient self care activities - Over the next 90 days, patient will: Patient to reduce the amount of high fat food they eat.  Try to do some exercise at least 30 minutes per day for 1-3 days.   Diabetes Lab Results  Component Value Date/Time   HGBA1C 6.3 (H) 09/06/2020 02:25 PM   HGBA1C 6.2 (H) 05/04/2020 11:38 AM  Pharmacist Clinical Goal(s): Over the next 90 days, patient will work with PharmD and providers to maintain A1c goal <7% Current regimen:  Ozempic 0.5 MG/Dose- Inject 0.5 mg in to the skin once  a week  Interventions: Patient given samples of Ozempic Discussed proper injection technique of medication Helped patient with patient assistance enrollment forms for 2021 Patient self care activities - Over the next 90 days, patient will: Check blood sugar twice daily, document, and provide at future appointments Contact provider with any episodes of hypoglycemia  Medication management Pharmacist Clinical Goal(s): Over the next 90 days, patient will work with PharmD and providers to maintain optimal medication adherence Current pharmacy: [facility] [phone]  Interventions Comprehensive medication review performed. Continue current medication management strategy Patient self care activities - Over the next 90 days, patient will: Focus on medication adherence by taking medication on a consistent schedule  Take medications as prescribed Report any questions or concerns to PharmD and/or provider(s)  Please see past updates related to this goal by clicking on the "Past Updates" button in the selected goal        Weight (lb) < 200 lb (90.7 kg)      09/01/2019, would like to lose 30  pounds        This is a list of the screening recommended for you and due dates:  Health Maintenance  Topic Date Due   COVID-19 Vaccine (5 - 2023-24 season) 08/30/2022   Eye exam for diabetics  12/06/2022   Mammogram  02/15/2023   Hemoglobin A1C  08/13/2023   Yearly kidney function blood test for diabetes  02/13/2024   Yearly kidney health urinalysis for diabetes  02/13/2024   Complete foot exam   02/13/2024   Medicare Annual Wellness Visit  03/04/2024   DTaP/Tdap/Td vaccine (3 - Td or Tdap) 06/08/2029   Colon Cancer Screening  08/26/2032   Pneumonia Vaccine  Completed   Flu Shot  Completed   DEXA scan (bone density measurement)  Completed   Hepatitis C Screening: USPSTF Recommendation to screen - Ages 25-79 yo.  Completed  Zoster (Shingles) Vaccine  Completed   HPV Vaccine  Aged Out    Advanced directives: Advance directive discussed with you today. Even though you declined this today please call our office should you change your mind and we can give you the proper paperwork for you to fill out.  Conditions/risks identified: none  Next appointment: Follow up in one year for your annual wellness visit    Preventive Care 65 Years and Older, Female Preventive care refers to lifestyle choices and visits with your health care provider that can promote health and wellness. What does preventive care include? A yearly physical exam. This is also called an annual well check. Dental exams once or twice a year. Routine eye exams. Ask your health care provider how often you should have your eyes checked. Personal lifestyle choices, including: Daily care of your teeth and gums. Regular physical activity. Eating a healthy diet. Avoiding tobacco and drug use. Limiting alcohol use. Practicing safe sex. Taking low-dose aspirin every day. Taking vitamin and mineral supplements as recommended by your health care provider. What happens during an annual well check? The services and  screenings done by your health care provider during your annual well check will depend on your age, overall health, lifestyle risk factors, and family history of disease. Counseling  Your health care provider may ask you questions about your: Alcohol use. Tobacco use. Drug use. Emotional well-being. Home and relationship well-being. Sexual activity. Eating habits. History of falls. Memory and ability to understand (cognition). Work and work Statistician. Reproductive health. Screening  You may have the following tests or measurements: Height, weight, and BMI. Blood pressure. Lipid and cholesterol levels. These may be checked every 5 years, or more frequently if you are over 79 years old. Skin check. Lung cancer screening. You may have this screening every year starting at age 52 if you have a 30-pack-year history of smoking and currently smoke or have quit within the past 15 years. Fecal occult blood test (FOBT) of the stool. You may have this test every year starting at age 35. Flexible sigmoidoscopy or colonoscopy. You may have a sigmoidoscopy every 5 years or a colonoscopy every 10 years starting at age 51. Hepatitis C blood test. Hepatitis B blood test. Sexually transmitted disease (STD) testing. Diabetes screening. This is done by checking your blood sugar (glucose) after you have not eaten for a while (fasting). You may have this done every 1-3 years. Bone density scan. This is done to screen for osteoporosis. You may have this done starting at age 21. Mammogram. This may be done every 1-2 years. Talk to your health care provider about how often you should have regular mammograms. Talk with your health care provider about your test results, treatment options, and if necessary, the need for more tests. Vaccines  Your health care provider may recommend certain vaccines, such as: Influenza vaccine. This is recommended every year. Tetanus, diphtheria, and acellular pertussis (Tdap,  Td) vaccine. You may need a Td booster every 10 years. Zoster vaccine. You may need this after age 65. Pneumococcal 13-valent conjugate (PCV13) vaccine. One dose is recommended after age 32. Pneumococcal polysaccharide (PPSV23) vaccine. One dose is recommended after age 74. Talk to your health care provider about which screenings and vaccines you need and how often you need them. This information is not intended to replace advice given to you by your health care provider. Make sure you discuss any questions you have with your health care provider. Document Released: 01/12/2016 Document  Revised: 09/04/2016 Document Reviewed: 10/17/2015 Elsevier Interactive Patient Education  2017 [place] Prevention in the Home Falls can cause injuries. They can happen to people of all ages. There are many things you can do to make your home safe and to help prevent falls. What can I do on the outside of my home? Regularly fix the edges of walkways and driveways and fix any cracks. Remove anything that might make you trip as you walk through a door, such as a raised step or threshold. Trim any bushes or trees on the path to your home. Use bright outdoor lighting. Clear any walking paths of anything that might make someone trip, such as rocks or tools. Regularly check to see if handrails are loose or broken. Make sure that both sides of any steps have handrails. Any raised decks and porches should have guardrails on the edges. Have any leaves, snow, or ice cleared regularly. Use sand or salt on walking paths during winter. Clean up any spills in your garage right away. This includes oil or grease spills. What can I do in the bathroom? Use night lights. Install grab bars by the toilet and in the tub and shower. Do not use towel bars as grab bars. Use non-skid mats or decals in the tub or shower. If you need to sit down in the shower, use a plastic, non-slip stool. Keep the floor dry. Clean up any  water that spills on the floor as soon as it happens. Remove soap buildup in the tub or shower regularly. Attach bath mats securely with double-sided non-slip rug tape. Do not have throw rugs and other things on the floor that can make you trip. What can I do in the bedroom? Use night lights. Make sure that you have a light by your bed that is easy to reach. Do not use any sheets or blankets that are too big for your bed. They should not hang down onto the floor. Have a firm chair that has side arms. You can use this for support while you get dressed. Do not have throw rugs and other things on the floor that can make you trip. What can I do in the kitchen? Clean up any spills right away. Avoid walking on wet floors. Keep items that you use a lot in easy-to-reach places. If you need to reach something above you, use a strong step stool that has a grab bar. Keep electrical cords out of the way. Do not use floor polish or wax that makes floors slippery. If you must use wax, use non-skid floor wax. Do not have throw rugs and other things on the floor that can make you trip. What can I do with my stairs? Do not leave any items on the stairs. Make sure that there are handrails on both sides of the stairs and use them. Fix handrails that are broken or loose. Make sure that handrails are as long as the stairways. Check any carpeting to make sure that it is firmly attached to the stairs. Fix any carpet that is loose or worn. Avoid having throw rugs at the top or bottom of the stairs. If you do have throw rugs, attach them to the floor with carpet tape. Make sure that you have a light switch at the top of the stairs and the bottom of the stairs. If you do not have them, ask someone to add them for you. What else can I do to help prevent falls? Wear shoes  that: Do not have high heels. Have rubber bottoms. Are comfortable and fit you well. Are closed at the toe. Do not wear sandals. If you use a  stepladder: Make sure that it is fully opened. Do not climb a closed stepladder. Make sure that both sides of the stepladder are locked into place. Ask someone to hold it for you, if possible. Clearly mark and make sure that you can see: Any grab bars or handrails. First and last steps. Where the edge of each step is. Use tools that help you move around (mobility aids) if they are needed. These include: Canes. Walkers. Scooters. Crutches. Turn on the lights when you go into a dark area. Replace any light bulbs as soon as they burn out. Set up your furniture so you have a clear path. Avoid moving your furniture around. If any of your floors are uneven, fix them. If there are any pets around you, be aware of where they are. Review your medicines with your doctor. Some medicines can make you feel dizzy. This can increase your chance of falling. Ask your doctor what other things that you can do to help prevent falls. This information is not intended to replace advice given to you by your health care provider. Make sure you discuss any questions you have with your health care provider. Document Released: 10/12/2009 Document Revised: 05/23/2016 Document Reviewed: 01/20/2015 Elsevier Interactive Patient Education  2017 [employer].

## 2023-03-21 ENCOUNTER — Telehealth: Payer: Self-pay

## 2023-03-21 ENCOUNTER — Telehealth: Payer: Self-pay | Admitting: Pharmacist

## 2023-03-21 ENCOUNTER — Other Ambulatory Visit: Payer: Medicare PPO | Admitting: Pharmacist

## 2023-03-21 NOTE — Progress Notes (Signed)
Attempted to contact patient for scheduled appointment for medication management. Left HIPAA compliant message for patient to return my call at their convenience.   Catie T. Alainna Stawicki, PharmD, BCACP, CPP [facility] [phone]  

## 2023-03-21 NOTE — Telephone Encounter (Signed)
Fax received for patient confirming a refill for 08/03/2023. Patient does not require a refill request form at this time.  [name], CPP, PharmD Clinical Pharmacist Practitioner [facility] [phone]

## 2023-03-31 NOTE — Progress Notes (Signed)
[name] is here today for follow up post radiation to the lung.  Lung Side: Rt lung  completed treatment on 12/11/21  Does the patient complain of any of the following: Pain:Denies  Shortness of breath w/wo exertion:  With activity Cough some phlegm foggy Hemoptysis: denies Pain with swallowing: denies Swallowing/choking concerns: denies Appetite: its ok Energy Level: mild fatigue Post radiation skin Changes: darken  to skin.  Vitals:   04/07/23 1114  BP: (!) 141/73  Pulse: 67  Resp: 18  Temp: 97.8 F (36.6 C)  SpO2: 100%  Weight: 71.7 kg  Height: 5\' 6"  (1.676 m)     Additional comments if applicable:

## 2023-04-03 ENCOUNTER — Ambulatory Visit (HOSPITAL_COMMUNITY)
Admission: RE | Admit: 2023-04-03 | Discharge: 2023-04-03 | Disposition: A | Discharge status: Home / Self Care | Payer: Medicare HMO | Source: Ambulatory Visit | Attending: Radiation Oncology | Admitting: Radiation Oncology

## 2023-04-03 DIAGNOSIS — C3491 Malignant neoplasm of unspecified part of right bronchus or lung: Secondary | ICD-10-CM | POA: Diagnosis not present

## 2023-04-03 DIAGNOSIS — R918 Other nonspecific abnormal finding of lung field: Secondary | ICD-10-CM | POA: Diagnosis not present

## 2023-04-03 DIAGNOSIS — C3490 Malignant neoplasm of unspecified part of unspecified bronchus or lung: Secondary | ICD-10-CM | POA: Diagnosis not present

## 2023-04-04 ENCOUNTER — Telehealth: Payer: Medicare HMO

## 2023-04-06 NOTE — Progress Notes (Signed)
Radiation Oncology         [phone] ________________________________  Name: [name] MRN: [id number]  Date: 04/07/2023  DOB: 06-14-1946  Follow-Up Visit Note  CC: Dorothyann Peng, MD  Josephine Igo, DO  No diagnosis found.  Diagnosis: Adenocarcinoma - RUL pulmonary nodule, clinical stage I   Interval Since Last Radiation: 1 year, 3 months, and 26 days   Intent: Curative  Radiation Treatment Dates: 12/04/2021 through 12/11/2021 Site Technique Total Dose (Gy) Dose per Fx (Gy) Completed Fx Beam Energies  Lung, Right: Lung_Rt IMRT 54/54 18 3/3 6XFFF   Narrative:  The patient returns today for routine follow-up. She was last seen here for follow-up on 10/03/22.   In the interval since the patient was last seen, she was hospitalized from 12/11/22 through 12/14/22 for management of a acute COPD exacerbation secondary to influenza, and acute hypoxic respiratory failure. On presentation, she had exertional dyspnea, wheezing, and a cough productive of yellow sputum. Hospital course included supplemental O2, bronchodilators, azithromycin course, and steroids with improvement. She was also given tamiflu and continued on Singulair. (Of note: chest x-ray performed on presentation showed treatment changes in the right upper lobe which appeared stable when compared to her CT from 2 months prior. No definite acute superimposed processes were appreciated).  Her most recent chest CT without contrast on 04/03/23 demonstrates: a minimal increase in size of the RUL nodule located adjacent to the fiducial marker favoring post radiation changes; an increase in haziness in the RUL adjacent to the nodule (also favoring post radiation changes); and stable fullness in the right hilum. CT otherwise shows no new pulmonary nodules.    Other imaging performed in the interval includes a bilateral screening mammogram on 02/27/33 which showed no evidence of malignancy in either breast.   ***   Allergies:   is allergic to other.  Meds: Current Outpatient Medications  Medication Sig Dispense Refill   albuterol (VENTOLIN HFA) 108 (90 Base) MCG/ACT inhaler INHALE 2 PUFFS INTO THE LUNGS EVERY 4 HOURS AS NEEDED FOR WHEEZING OR SHORTNESS OF BREATH 18 g 12   aspirin 81 MG tablet Take 81 mg by mouth daily.     atorvastatin (LIPITOR) 80 MG tablet TAKE 1 TABLET BY MOUTH DAILY MONDAY-FRIDAY AND SKIP WEEKENDS 90 tablet 1   Biotin 73567 MCG TBDP Take 10,000 mcg by mouth daily.     bisoprolol (ZEBETA) 5 MG tablet TAKE 1/2 TABLET BY MOUTH EVERY DAY 45 tablet 3   Budeson-Glycopyrrol-Formoterol (BREZTRI AEROSPHERE) 160-9-4.8 MCG/ACT AERO Inhale 2 puffs into the lungs 2 (two) times daily. 10.7 g 6   CALCIUM-MAGNESIUM-ZINC PO Take 1 capsule by mouth at bedtime.     Cholecalciferol (VITAMIN D3) 2000 UNITS TABS Take 2,000 Units by mouth at bedtime.     DULoxetine (CYMBALTA) 60 MG capsule TAKE 1 CAPSULE(60 MG) BY MOUTH DAILY 90 capsule 1   famotidine (PEPCID) 20 MG tablet One after supper 90 tablet 1   glucose blood (ACCU-CHEK GUIDE) test strip 1 each by Other route daily. Use as instructed to check blood sugars 1 time per day dx: e11.22 50 each 11   ipratropium-albuterol (DUONEB) 0.5-2.5 (3) MG/3ML SOLN USE 3 ML VIA NEBULIZER EVERY 6 HOURS AS NEEDED 180 mL 1   Magnesium 250 MG TABS Take 1 tablet by mouth daily.     montelukast (SINGULAIR) 10 MG tablet TAKE 1 TABLET(10 MG) BY MOUTH AT BEDTIME 90 tablet 3   oxyCODONE-acetaminophen (PERCOCET) 10-325 MG tablet Take 1 tablet by mouth  4 (four) times daily as needed.     pantoprazole (PROTONIX) 40 MG tablet TAKE 1 TABLET BY MOUTH 30 MINUTES BEFORE FIRST MEAL OF THE DAY 90 tablet 2   predniSONE (DELTASONE) 10 MG tablet Take 1 tablet (10 mg total) by mouth daily. Take 4 pills daily for 3 days then 3 pills daily for 3 days then 2 pills daily for 3 days then 1 pill daily for 3 days then stop (Patient not taking: Reported on 02/12/2023) 30 tablet 0   pregabalin (LYRICA) 100 MG  capsule Take 100 mg by mouth 3 (three) times daily.     Semaglutide, 1 MG/DOSE, (OZEMPIC, 1 MG/DOSE,) 4 MG/3ML SOPN Inject 1 mg into the skin once a week. Friday 3 mL 3   No current facility-administered medications for this encounter.    Physical Findings: The patient is in no acute distress. Patient is alert and oriented.  vitals were not taken for this visit. .  No significant changes. Lungs are clear to auscultation bilaterally. Heart has regular rate and rhythm. No palpable cervical, supraclavicular, or axillary adenopathy. Abdomen soft, non-tender, normal bowel sounds.   Lab Findings: Lab Results  Component Value Date   WBC 4.4 02/12/2023   HGB 12.6 02/12/2023   HCT 39.1 02/12/2023   MCV 91 02/12/2023   PLT 322 02/12/2023    Radiographic Findings: CT CHEST WO CONTRAST  Result Date: 04/04/2023 CLINICAL DATA:  Non-small cell lung cancer. Assess treatment response. * Tracking Code: BO *. EXAM: CT CHEST WITHOUT CONTRAST TECHNIQUE: Multidetector CT imaging of the chest was performed following the standard protocol without IV contrast. RADIATION DOSE REDUCTION: This exam was performed according to the departmental dose-optimization program which includes automated exposure control, adjustment of the mA and/or kV according to patient size and/or use of iterative reconstruction technique. COMPARISON:  Chest CT 09/30/2022 FINDINGS: Cardiovascular: Coronary artery calcification and aortic atherosclerotic calcification. Mediastinum/Nodes: No enlarged mediastinal or axillary lymph nodes. Thyroid gland, trachea, and esophagus demonstrate no significant findings. Fullness in the RIGHT hilum similar to prior. Tissue planes difficult to define on noncontrast exam. Lungs/Pleura: RIGHT upper lobe nodule adjacent to fiducial marker measures 11 mm x 11 mm (image 46/5) compared to 10 mm x 8 mm. Increase haziness within the lung parenchyma adjacent the nodule. Arc like bands of parenchymal thickening  posterior to the RIGHT upper lobe nodule are not changed from prior. No new pulmonary nodules. A small flattened nodule along the RIGHT horizontal fissure measuring 6 mm (image 70) is unchanged. Upper Abdomen: Limited view of the liver, kidneys, pancreas are unremarkable. Normal adrenal glands. Musculoskeletal: No aggressive osseous lesion IMPRESSION: 1. Minimal increase in size of RIGHT upper lobe nodule adjacent to fiducial marker. Favor post radiation change. 2. Increase in haziness in the RIGHT upper lobe adjacent to the nodule. Favor post radiation change. 3. No new pulmonary nodules. 4. Stable fullness in the RIGHT hilum. Tissue planes difficult to define on noncontrast exam. Consider contrast CT on next routine schedule surveillance exam. 5.  Aortic Atherosclerosis (ICD10-I70.0). Electronically Signed   By: Genevive BiStewart  Edmunds M.D.   On: 04/04/2023 09:19    Impression: Adenocarcinoma - RUL pulmonary nodule, clinical stage I   The patient is recovering from the effects of radiation.  ***  Plan:  ***   *** minutes of total time was spent for this patient encounter, including preparation, face-to-face counseling with the patient and coordination of care, physical exam, and documentation of the encounter. ____________________________________  Billie LadeJames D. Mohannad Olivero, PhD, MD  This document serves as a record of services personally performed by Gery Pray, MD. It was created on his behalf by Roney Mans, a trained medical scribe. The creation of this record is based on the scribe's personal observations and the provider's statements to them. This document has been checked and approved by the attending provider.

## 2023-04-07 ENCOUNTER — Ambulatory Visit
Admission: RE | Admit: 2023-04-07 | Discharge: 2023-04-07 | Disposition: A | Discharge status: Home / Self Care | Payer: Medicare HMO | Source: Ambulatory Visit | Attending: Radiation Oncology | Admitting: Radiation Oncology

## 2023-04-07 ENCOUNTER — Encounter: Payer: Self-pay | Admitting: Radiation Oncology

## 2023-04-07 VITALS — BP 141/73 | HR 67 | Temp 97.8°F | Resp 18 | Ht 66.0 in | Wt 158.0 lb

## 2023-04-07 DIAGNOSIS — I700 Atherosclerosis of aorta: Secondary | ICD-10-CM | POA: Insufficient documentation

## 2023-04-07 DIAGNOSIS — Z923 Personal history of irradiation: Secondary | ICD-10-CM | POA: Diagnosis not present

## 2023-04-07 DIAGNOSIS — Z79899 Other long term (current) drug therapy: Secondary | ICD-10-CM | POA: Diagnosis not present

## 2023-04-07 DIAGNOSIS — J449 Chronic obstructive pulmonary disease, unspecified: Secondary | ICD-10-CM | POA: Diagnosis not present

## 2023-04-07 DIAGNOSIS — Z7952 Long term (current) use of systemic steroids: Secondary | ICD-10-CM | POA: Insufficient documentation

## 2023-04-07 DIAGNOSIS — C3491 Malignant neoplasm of unspecified part of right bronchus or lung: Secondary | ICD-10-CM

## 2023-04-07 DIAGNOSIS — C3411 Malignant neoplasm of upper lobe, right bronchus or lung: Secondary | ICD-10-CM | POA: Diagnosis not present

## 2023-04-07 DIAGNOSIS — Z85118 Personal history of other malignant neoplasm of bronchus and lung: Secondary | ICD-10-CM | POA: Insufficient documentation

## 2023-04-07 DIAGNOSIS — Z87891 Personal history of nicotine dependence: Secondary | ICD-10-CM | POA: Diagnosis not present

## 2023-04-07 DIAGNOSIS — Z7982 Long term (current) use of aspirin: Secondary | ICD-10-CM | POA: Insufficient documentation

## 2023-04-08 ENCOUNTER — Ambulatory Visit: Payer: Self-pay | Admitting: *Deleted

## 2023-04-08 ENCOUNTER — Ambulatory Visit (INDEPENDENT_AMBULATORY_CARE_PROVIDER_SITE_OTHER): Payer: Medicare HMO | Admitting: *Deleted

## 2023-04-08 DIAGNOSIS — I10 Essential (primary) hypertension: Secondary | ICD-10-CM

## 2023-04-08 DIAGNOSIS — E1165 Type 2 diabetes mellitus with hyperglycemia: Secondary | ICD-10-CM

## 2023-04-08 NOTE — Chronic Care Management (AMB) (Signed)
Chronic Care Management   CCM RN Visit Note  04/08/2023 Name: [name] MRN: [id number] DOB: 07-20-1946  Subjective: [name] is a 77 y.o. year old female who is a primary care patient of Dorothyann Peng, MD. The patient was referred to the Chronic Care Management team for assistance with care management needs subsequent to provider initiation of CCM services and plan of care.    Today's Visit:  Engaged with patient by telephone for follow up visit.        Goals Addressed             This Visit's Progress    CCM Expected Outcome:  Monitor, Self-Manage, and Reduce Symptoms of Hypertension       Current Barriers:  Chronic Disease Management support and education needs related to effective management of HTN Patient reports she got a new blood pressure cuff and will consider checking blood pressure Patient reports she has "runny nose and eyes", not sure if allergies asks what she can take Message received from primary care provider stating pt can try loratidine for allergy symptoms  Planned Interventions: Evaluation of current treatment plan related to hypertension self management and patient's adherence to plan as established by provider;   Reinforced prescribed diet low sodium Reviewed medications with patient and discussed importance of compliance;  Discussed plans with patient for ongoing care management follow up and provided patient with direct contact information for care management team; Advised patient, providing education and rationale, to monitor blood pressure daily and record, calling PCP for findings outside established parameters;  Reviewed scheduled/upcoming provider appointments including: primary care provider Advised patient to discuss changes in blood pressure or heart health with provider; Provided education on prescribed diet low sodium- in my chart Discussed complications of poorly controlled blood pressure such as heart disease, stroke, circulatory  complications, vision complications, kidney impairment, sexual dysfunction;  In basket message sent to primary care provider reporting pt has symptoms of allergies (runny nose and eyes), pt is asking what would be best to take Telephone call to patient, no answer to telephone, left message for pt that she can take loratidine and to call her doctor if this does not help  Symptom Management: Take medications as prescribed   Attend all scheduled provider appointments Call pharmacy for medication refills 3-7 days in advance of running out of medications Attend church or other social activities Perform all self care activities independently  Perform IADL's (shopping, preparing meals, housekeeping, managing finances) independently Call provider office for new concerns or questions  check blood pressure weekly choose a place to take my blood pressure (home, clinic or office, retail store) write blood pressure results in a log or diary learn about high blood pressure keep a blood pressure log call doctor for signs and symptoms of high blood pressure develop an action plan for high blood pressure keep all doctor appointments take medications for blood pressure exactly as prescribed report new symptoms to your doctor eat more whole grains, fruits and vegetables, lean meats and healthy fats Allergy symptoms reported to your primary care provider Take loratidine (generic Claritin) for allergy symptoms, call your doctor if this does not help  Follow Up Plan: Telephone follow up appointment with care management team member scheduled for:   06/30/23 at 130 pm          Plan:Telephone follow up appointment with care management team member scheduled for:  06/30/23 at 130 pm  Irving Shows [facility], BSN RN Case Manager [facility] [phone]

## 2023-04-08 NOTE — Chronic Care Management (AMB) (Signed)
Chronic Care Management   CCM RN Visit Note  04/08/2023 Name: [name] MRN: [id number] DOB: [birth year]  Subjective: [name] is a 77 y.o. year old female who is a primary care patient of Dorothyann Peng, MD. The patient was referred to the Chronic Care Management team for assistance with care management needs subsequent to provider initiation of CCM services and plan of care.    Today's Visit:  Engaged with patient by telephone for follow up visit.        Goals Addressed             This Visit's Progress    CCM Expected Outcome:  Monitor, Self-Manage and Reduce Symptoms of Diabetes       Current Barriers:  Knowledge Deficits related to the importance of checking blood sugars on a regular basis and the benefits of following a heart healthy/ADA diet Care Coordination needs related to pharm D support with effective medication management  in a patient with DM Chronic Disease Management support and education needs related to effective management of DM Patient reports she has a glucometer but does not monitor her blood sugar, does not feel the need to do so Patient reports she continues following up with pain management Patient reports she is walking her dog some   Planned Interventions: Reviewed medications with patient and discussed importance of medication adherence;        Reinforced prescribed diet with patient heart healthy/ADA diet/ carbohydrate modified. Counseled on importance of regular laboratory monitoring as prescribed;        Discussed plans with patient for ongoing care management follow up and provided patient with direct contact information for care management team;           Reviewed scheduled/upcoming provider appointments including: primary care provider in June         Advised patient, providing education and rationale, to check cbg once daily, when you have symptoms of low or high blood sugar, and encouraged the patient to take blood sugars on a  regular basis for baseline status  and record        call provider for findings outside established parameters;            Review of patient status, including review of consultants reports, relevant laboratory and other test results, and medications completed;       Advised patient to discuss changes in DM, questions or concerns with provider;      Reviewed importance of exercising as you are able  Symptom Management: Take medications as prescribed   Attend all scheduled provider appointments Call pharmacy for medication refills 3-7 days in advance of running out of medications Perform all self care activities independently  Perform IADL's (shopping, preparing meals, housekeeping, managing finances) independently Call provider office for new concerns or questions  keep appointment with eye doctor check blood sugar at prescribed times: once daily and when you have symptoms of low or high blood sugar check feet daily for cuts, sores or redness enter blood sugar readings and medication or insulin into daily log take the blood sugar log to all doctor visits take the blood sugar meter to all doctor visits trim toenails straight across fill half of plate with vegetables manage portion size prepare main meal at home 3 to 5 days each week wash and dry feet carefully every day wear comfortable, cotton socks wear comfortable, well-fitting shoes Continue to walk your dog when you are able  Follow Up Plan: Telephone follow  up appointment with care management team member scheduled for:  06/30/23 at 130 pm       CCM Expected Outcome:  Monitor, Self-Manage, and Reduce Symptoms of Hypertension       Current Barriers:  Chronic Disease Management support and education needs related to effective management of HTN Patient reports she got a new blood pressure cuff and will consider checking blood pressure Patient reports she has "runny nose and eyes", not sure if allergies asks what she can  take  Planned Interventions: Evaluation of current treatment plan related to hypertension self management and patient's adherence to plan as established by provider;   Reinforced prescribed diet low sodium Reviewed medications with patient and discussed importance of compliance;  Discussed plans with patient for ongoing care management follow up and provided patient with direct contact information for care management team; Advised patient, providing education and rationale, to monitor blood pressure daily and record, calling PCP for findings outside established parameters;  Reviewed scheduled/upcoming provider appointments including: primary care provider Advised patient to discuss changes in blood pressure or heart health with provider; Provided education on prescribed diet low sodium- in my chart Discussed complications of poorly controlled blood pressure such as heart disease, stroke, circulatory complications, vision complications, kidney impairment, sexual dysfunction;  In basket message sent to primary care provider reporting pt has symptoms of allergies (runny nose and eyes), pt is asking what would be best to take   Symptom Management: Take medications as prescribed   Attend all scheduled provider appointments Call pharmacy for medication refills 3-7 days in advance of running out of medications Attend church or other social activities Perform all self care activities independently  Perform IADL's (shopping, preparing meals, housekeeping, managing finances) independently Call provider office for new concerns or questions  check blood pressure weekly choose a place to take my blood pressure (home, clinic or office, retail store) write blood pressure results in a log or diary learn about high blood pressure keep a blood pressure log call doctor for signs and symptoms of high blood pressure develop an action plan for high blood pressure keep all doctor appointments take  medications for blood pressure exactly as prescribed report new symptoms to your doctor eat more whole grains, fruits and vegetables, lean meats and healthy fats Allergy symptoms reported to your primary care provider  Follow Up Plan: Telephone follow up appointment with care management team member scheduled for:   06/30/23 at 130 pm          Plan:Telephone follow up appointment with care management team member scheduled for:  06/30/23 at 130 pm  Irving ShowsJulie Alphonzo Devera [facility] AnacondaRNC, BSN RN Case Manager [facility] [phone]

## 2023-04-08 NOTE — Patient Instructions (Addendum)
Please call the care guide team at [phone] if you need to cancel or reschedule your appointment.   If you are experiencing a Mental Health or Behavioral Health Crisis or need someone to talk to, please call the Suicide and Crisis Lifeline: 988 call the [place] National Suicide Prevention Lifeline: 9107658066 or TTY: 225-663-4913 TTY 5806611932) to talk to a trained counselor call 1-800-273-TALK (toll free, 24 hour hotline) go to [facility] [address], [place] [phone]) call 911   Following is a copy of the CCM Program Consent:  CCM service includes personalized support from designated clinical staff supervised by the physician, including individualized plan of care and coordination with other care providers 24/7 contact phone numbers for assistance for urgent and routine care needs. Service will only be billed when office clinical staff spend 20 minutes or more in a month to coordinate care. Only one practitioner may furnish and bill the service in a calendar month. The patient may stop CCM services at amy time (effective at the end of the month) by phone call to the office staff. The patient will be responsible for cost sharing (co-pay) or up to 20% of the service fee (after annual deductible is met)  Following is a copy of your full provider care plan:   Goals Addressed             This Visit's Progress    CCM Expected Outcome:  Monitor, Self-Manage and Reduce Symptoms of Diabetes       Current Barriers:  Knowledge Deficits related to the importance of checking blood sugars on a regular basis and the benefits of following a heart healthy/ADA diet Care Coordination needs related to pharm D support with effective medication management  in a patient with DM Chronic Disease Management support and education needs related to effective management of DM Patient reports she has a glucometer but does not monitor her blood sugar, does not  feel the need to do so Patient reports she continues following up with pain management Patient reports she is walking her dog some   Planned Interventions: Reviewed medications with patient and discussed importance of medication adherence;        Reinforced prescribed diet with patient heart healthy/ADA diet/ carbohydrate modified. Counseled on importance of regular laboratory monitoring as prescribed;        Discussed plans with patient for ongoing care management follow up and provided patient with direct contact information for care management team;           Reviewed scheduled/upcoming provider appointments including: primary care provider in June         Advised patient, providing education and rationale, to check cbg once daily, when you have symptoms of low or high blood sugar, and encouraged the patient to take blood sugars on a regular basis for baseline status  and record        call provider for findings outside established parameters;            Review of patient status, including review of consultants reports, relevant laboratory and other test results, and medications completed;       Advised patient to discuss changes in DM, questions or concerns with provider;      Reviewed importance of exercising as you are able  Symptom Management: Take medications as prescribed   Attend all scheduled provider appointments Call pharmacy for medication refills 3-7 days in advance of running out of medications Perform all self care activities independently  Perform IADL's (shopping, preparing meals, housekeeping, managing finances) independently Call provider office for new concerns or questions  keep appointment with eye doctor check blood sugar at prescribed times: once daily and when you have symptoms of low or high blood sugar check feet daily for cuts, sores or redness enter blood sugar readings and medication or insulin into daily log take the blood sugar log to all doctor  visits take the blood sugar meter to all doctor visits trim toenails straight across fill half of plate with vegetables manage portion size prepare main meal at home 3 to 5 days each week wash and dry feet carefully every day wear comfortable, cotton socks wear comfortable, well-fitting shoes Continue to walk your dog when you are able  Follow Up Plan: Telephone follow up appointment with care management team member scheduled for:  06/30/23 at 130 pm       CCM Expected Outcome:  Monitor, Self-Manage, and Reduce Symptoms of Hypertension       Current Barriers:  Chronic Disease Management support and education needs related to effective management of HTN Patient reports she got a new blood pressure cuff and will consider checking blood pressure Patient reports she has "runny nose and eyes", not sure if allergies asks what she can take  Planned Interventions: Evaluation of current treatment plan related to hypertension self management and patient's adherence to plan as established by provider;   Reinforced prescribed diet low sodium Reviewed medications with patient and discussed importance of compliance;  Discussed plans with patient for ongoing care management follow up and provided patient with direct contact information for care management team; Advised patient, providing education and rationale, to monitor blood pressure daily and record, calling PCP for findings outside established parameters;  Reviewed scheduled/upcoming provider appointments including: primary care provider Advised patient to discuss changes in blood pressure or heart health with provider; Provided education on prescribed diet low sodium- in my chart Discussed complications of poorly controlled blood pressure such as heart disease, stroke, circulatory complications, vision complications, kidney impairment, sexual dysfunction;  In basket message sent to primary care provider reporting pt has symptoms of allergies  (runny nose and eyes), pt is asking what would be best to take   Symptom Management: Take medications as prescribed   Attend all scheduled provider appointments Call pharmacy for medication refills 3-7 days in advance of running out of medications Attend church or other social activities Perform all self care activities independently  Perform IADL's (shopping, preparing meals, housekeeping, managing finances) independently Call provider office for new concerns or questions  check blood pressure weekly choose a place to take my blood pressure (home, clinic or office, retail store) write blood pressure results in a log or diary learn about high blood pressure keep a blood pressure log call doctor for signs and symptoms of high blood pressure develop an action plan for high blood pressure keep all doctor appointments take medications for blood pressure exactly as prescribed report new symptoms to your doctor eat more whole grains, fruits and vegetables, lean meats and healthy fats Allergy symptoms reported to your primary care provider  Follow Up Plan: Telephone follow up appointment with care management team member scheduled for:   06/30/23 at 130 pm          Patient verbalizes understanding of instructions and care plan provided today and agrees to view in MyChart. Active MyChart status and patient understanding of how to access instructions and care plan via MyChart confirmed with patient.  Telephone follow up appointment with care management team member scheduled for:  06/30/23 at 130 pm  Low-Sodium Eating Plan Sodium, which is an element that makes up salt, helps you maintain a healthy balance of fluids in your body. Too much sodium can increase your blood pressure and cause fluid and waste to be held in your body. Your health care provider or dietitian may recommend following this plan if you have high blood pressure (hypertension), kidney disease, liver disease, or heart  failure. Eating less sodium can help lower your blood pressure, reduce swelling, and protect your heart, liver, and kidneys. What are tips for following this plan? Reading food labels The Nutrition Facts label lists the amount of sodium in one serving of the food. If you eat more than one serving, you must multiply the listed amount of sodium by the number of servings. Choose foods with less than 140 mg of sodium per serving. Avoid foods with 300 mg of sodium or more per serving. Shopping  Look for lower-sodium products, often labeled as "low-sodium" or "no salt added." Always check the sodium content, even if foods are labeled as "unsalted" or "no salt added." Buy fresh foods. Avoid canned foods and pre-made or frozen meals. Avoid canned, cured, or processed meats. Buy breads that have less than 80 mg of sodium per slice. Cooking  Eat more home-cooked food and less restaurant, buffet, and fast food. Avoid adding salt when cooking. Use salt-free seasonings or herbs instead of table salt or sea salt. Check with your health care provider or pharmacist before using salt substitutes. Cook with plant-based oils, such as canola, sunflower, or olive oil. Meal planning When eating at a restaurant, ask that your food be prepared with less salt or no salt, if possible. Avoid dishes labeled as brined, pickled, cured, smoked, or made with soy sauce, miso, or teriyaki sauce. Avoid foods that contain MSG (monosodium glutamate). MSG is sometimes added to [place] food, bouillon, and some canned foods. Make meals that can be grilled, baked, poached, roasted, or steamed. These are generally made with less sodium. General information Most people on this plan should limit their sodium intake to 1,500-2,000 mg (milligrams) of sodium each day. What foods should I eat? Fruits Fresh, frozen, or canned fruit. Fruit juice. Vegetables Fresh or frozen vegetables. "No salt added" canned vegetables. "No salt added"  tomato sauce and paste. Low-sodium or reduced-sodium tomato and vegetable juice. Grains Low-sodium cereals, including oats, puffed wheat and rice, and shredded wheat. Low-sodium crackers. Unsalted rice. Unsalted pasta. Low-sodium bread. Whole-grain breads and whole-grain pasta. Meats and other proteins Fresh or frozen (no salt added) meat, poultry, seafood, and fish. Low-sodium canned tuna and salmon. Unsalted nuts. Dried peas, beans, and lentils without added salt. Unsalted canned beans. Eggs. Unsalted nut butters. Dairy Milk. Soy milk. Cheese that is naturally low in sodium, such as ricotta cheese, fresh mozzarella, or Swiss cheese. Low-sodium or reduced-sodium cheese. Cream cheese. Yogurt. Seasonings and condiments Fresh and dried herbs and spices. Salt-free seasonings. Low-sodium mustard and ketchup. Sodium-free salad dressing. Sodium-free light mayonnaise. Fresh or refrigerated horseradish. Lemon juice. Vinegar. Other foods Homemade, reduced-sodium, or low-sodium soups. Unsalted popcorn and pretzels. Low-salt or salt-free chips. The items listed above may not be a complete list of foods and beverages you can eat. Contact a dietitian for more information. What foods should I avoid? Vegetables Sauerkraut, pickled vegetables, and relishes. Olives. [place] fries. Onion rings. Regular canned vegetables (not low-sodium or reduced-sodium). Regular canned tomato sauce and paste (not  low-sodium or reduced-sodium). Regular tomato and vegetable juice (not low-sodium or reduced-sodium). Frozen vegetables in sauces. Grains Instant hot cereals. Bread stuffing, pancake, and biscuit mixes. Croutons. Seasoned rice or pasta mixes. Noodle soup cups. Boxed or frozen macaroni and cheese. Regular salted crackers. Self-rising flour. Meats and other proteins Meat or fish that is salted, canned, smoked, spiced, or pickled. Precooked or cured meat, such as sausages or meat loaves. Tomasa Blase. Ham. Pepperoni. Hot dogs. Corned  beef. Chipped beef. Salt pork. Jerky. Pickled herring. Anchovies and sardines. Regular canned tuna. Salted nuts. Dairy Processed cheese and cheese spreads. Hard cheeses. Cheese curds. Blue cheese. Feta cheese. String cheese. Regular cottage cheese. Buttermilk. Canned milk. Fats and oils Salted butter. Regular margarine. Ghee. Bacon fat. Seasonings and condiments Onion salt, garlic salt, seasoned salt, table salt, and sea salt. Canned and packaged gravies. Worcestershire sauce. Tartar sauce. Barbecue sauce. Teriyaki sauce. Soy sauce, including reduced-sodium. Steak sauce. Fish sauce. Oyster sauce. Cocktail sauce. Horseradish that you find on the shelf. Regular ketchup and mustard. Meat flavorings and tenderizers. Bouillon cubes. Hot sauce. Pre-made or packaged marinades. Pre-made or packaged taco seasonings. Relishes. Regular salad dressings. Salsa. Other foods Salted popcorn and pretzels. Corn chips and puffs. Potato and tortilla chips. Canned or dried soups. Pizza. Frozen entrees and pot pies. The items listed above may not be a complete list of foods and beverages you should avoid. Contact a dietitian for more information. Summary Eating less sodium can help lower your blood pressure, reduce swelling, and protect your heart, liver, and kidneys. Most people on this plan should limit their sodium intake to 1,500-2,000 mg (milligrams) of sodium each day. Canned, boxed, and frozen foods are high in sodium. Restaurant foods, fast foods, and pizza are also very high in sodium. You also get sodium by adding salt to food. Try to cook at home, eat more fresh fruits and vegetables, and eat less fast food and canned, processed, or prepared foods. This information is not intended to replace advice given to you by your health care provider. Make sure you discuss any questions you have with your health care provider. Document Revised: 11/22/2019 Document Reviewed: 11/17/2019 Elsevier Patient Education  2023  [employer].

## 2023-04-08 NOTE — Patient Instructions (Signed)
Please call the care guide team at [phone] if you need to cancel or reschedule your appointment.   If you are experiencing a Mental Health or Behavioral Health Crisis or need someone to talk to, please call the Suicide and Crisis Lifeline: 988 call the [place] National Suicide Prevention Lifeline: 937-125-31861-570-212-4325 or TTY: (315)332-37791-800-799-4 TTY (845)561-4302(1-(707)011-1903) to talk to a trained counselor call 1-800-273-TALK (toll free, 24 hour hotline) [facility] to [facility] [address], [place] [phone]) call 911   Following is a copy of the CCM Program Consent:  CCM service includes personalized support from designated clinical staff supervised by the physician, including individualized plan of care and coordination with other care providers 24/7 contact phone numbers for assistance for urgent and routine care needs. Service will only be billed when office clinical staff spend 20 minutes or more in a month to coordinate care. Only one practitioner may furnish and bill the service in a calendar month. The patient may stop CCM services at amy time (effective at the end of the month) by phone call to the office staff. The patient will be responsible for cost sharing (co-pay) or up to 20% of the service fee (after annual deductible is met)  Following is a copy of your full provider care plan:   Goals Addressed             This Visit's Progress    CCM Expected Outcome:  Monitor, Self-Manage, and Reduce Symptoms of Hypertension       Current Barriers:  Chronic Disease Management support and education needs related to effective management of HTN Patient reports she got a new blood pressure cuff and will consider checking blood pressure Patient reports she has "runny nose and eyes", not sure if allergies asks what she can take Message received from primary care provider stating pt can try loratidine for allergy symptoms  Planned Interventions: Evaluation of current  treatment plan related to hypertension self management and patient's adherence to plan as established by provider;   Reinforced prescribed diet low sodium Reviewed medications with patient and discussed importance of compliance;  Discussed plans with patient for ongoing care management follow up and provided patient with direct contact information for care management team; Advised patient, providing education and rationale, to monitor blood pressure daily and record, calling PCP for findings outside established parameters;  Reviewed scheduled/upcoming provider appointments including: primary care provider Advised patient to discuss changes in blood pressure or heart health with provider; Provided education on prescribed diet low sodium- in my chart Discussed complications of poorly controlled blood pressure such as heart disease, stroke, circulatory complications, vision complications, kidney impairment, sexual dysfunction;  In basket message sent to primary care provider reporting pt has symptoms of allergies (runny nose and eyes), pt is asking what would be best to take Telephone call to patient, no answer to telephone, left message for pt that she can take loratidine and to call her doctor if this does not help  Symptom Management: Take medications as prescribed   Attend all scheduled provider appointments Call pharmacy for medication refills 3-7 days in advance of running out of medications Attend church or other social activities Perform all self care activities independently  Perform IADL's (shopping, preparing meals, housekeeping, managing finances) independently Call provider office for new concerns or questions  check blood pressure weekly choose a place to take my blood pressure (home, clinic or office, retail store) write blood pressure results in a log or diary learn about high  blood pressure keep a blood pressure log call doctor for signs and symptoms of high blood  pressure develop an action plan for high blood pressure keep all doctor appointments take medications for blood pressure exactly as prescribed report new symptoms to your doctor eat more whole grains, fruits and vegetables, lean meats and healthy fats Allergy symptoms reported to your primary care provider Take loratidine (generic Claritin) for allergy symptoms, call your doctor if this does not help  Follow Up Plan: Telephone follow up appointment with care management team member scheduled for:   06/30/23 at 130 pm          Patient verbalizes understanding of instructions and care plan provided today and agrees to view in MyChart. Active MyChart status and patient understanding of how to access instructions and care plan via MyChart confirmed with patient.     Telephone follow up appointment with care management team member scheduled for:  06/30/23 at 130 pm

## 2023-04-16 ENCOUNTER — Telehealth: Payer: Self-pay

## 2023-04-16 NOTE — Progress Notes (Addendum)
[employer] patient assistance program notification:  120- day supply of Ozempic was filled on 04/08/2023 and should arrive to the office in 10-14 business days. Patient has 1  refill remaining and enrollment will expire on 12/30/2023.    Billee Cashing, CMA Clinical Pharmacist Assistant [phone]

## 2023-04-18 ENCOUNTER — Telehealth: Payer: Self-pay

## 2023-04-18 NOTE — Telephone Encounter (Signed)
Called patient to inform her that her Ozempic 1 mg dose, came in patient voiced understanding and will come by to pick up. Cherylin Mylar, CPP, PharmD Clinical Pharmacist Practitioner [facility] [phone]

## 2023-04-19 ENCOUNTER — Other Ambulatory Visit: Payer: Self-pay | Admitting: Internal Medicine

## 2023-04-24 ENCOUNTER — Telehealth: Payer: Self-pay

## 2023-04-24 NOTE — Progress Notes (Cosign Needed)
[employer] reorder form filled out for [employer], will print for provider to sign.    Billee Cashing, CMA Clinical Pharmacist Assistant [phone]

## 2023-04-29 ENCOUNTER — Telehealth: Payer: Self-pay

## 2023-04-29 DIAGNOSIS — I10 Essential (primary) hypertension: Secondary | ICD-10-CM

## 2023-04-29 DIAGNOSIS — E1165 Type 2 diabetes mellitus with hyperglycemia: Secondary | ICD-10-CM

## 2023-04-29 NOTE — Progress Notes (Cosign Needed Addendum)
As of 04/21/2023 patient picked up 4 boxes of Ozempic, [employer] patient assistance from PCP office.   Faxed refill request form to [employer] for [employer], patient has 1 refill remaining.  Billee Cashing, CMA Clinical Pharmacist Assistant [phone]

## 2023-05-12 DIAGNOSIS — H04123 Dry eye syndrome of bilateral lacrimal glands: Secondary | ICD-10-CM | POA: Diagnosis not present

## 2023-05-12 DIAGNOSIS — H2513 Age-related nuclear cataract, bilateral: Secondary | ICD-10-CM | POA: Diagnosis not present

## 2023-05-12 DIAGNOSIS — H0288B Meibomian gland dysfunction left eye, upper and lower eyelids: Secondary | ICD-10-CM | POA: Diagnosis not present

## 2023-05-12 DIAGNOSIS — H43812 Vitreous degeneration, left eye: Secondary | ICD-10-CM | POA: Diagnosis not present

## 2023-05-12 DIAGNOSIS — H0288A Meibomian gland dysfunction right eye, upper and lower eyelids: Secondary | ICD-10-CM | POA: Diagnosis not present

## 2023-05-12 DIAGNOSIS — E119 Type 2 diabetes mellitus without complications: Secondary | ICD-10-CM | POA: Diagnosis not present

## 2023-05-12 DIAGNOSIS — H40013 Open angle with borderline findings, low risk, bilateral: Secondary | ICD-10-CM | POA: Diagnosis not present

## 2023-05-12 LAB — HM DIABETES EYE EXAM

## 2023-05-22 ENCOUNTER — Other Ambulatory Visit: Payer: Self-pay | Admitting: Internal Medicine

## 2023-05-22 DIAGNOSIS — G894 Chronic pain syndrome: Secondary | ICD-10-CM | POA: Diagnosis not present

## 2023-05-22 DIAGNOSIS — Z79899 Other long term (current) drug therapy: Secondary | ICD-10-CM | POA: Diagnosis not present

## 2023-05-22 DIAGNOSIS — C3491 Malignant neoplasm of unspecified part of right bronchus or lung: Secondary | ICD-10-CM | POA: Diagnosis not present

## 2023-05-22 DIAGNOSIS — G629 Polyneuropathy, unspecified: Secondary | ICD-10-CM | POA: Diagnosis not present

## 2023-05-22 DIAGNOSIS — M25511 Pain in right shoulder: Secondary | ICD-10-CM | POA: Diagnosis not present

## 2023-05-29 ENCOUNTER — Encounter: Payer: Self-pay | Admitting: Internal Medicine

## 2023-05-30 ENCOUNTER — Other Ambulatory Visit: Payer: Self-pay | Admitting: Internal Medicine

## 2023-06-16 ENCOUNTER — Encounter: Payer: Self-pay | Admitting: Internal Medicine

## 2023-06-16 ENCOUNTER — Ambulatory Visit (INDEPENDENT_AMBULATORY_CARE_PROVIDER_SITE_OTHER): Payer: Medicare HMO | Admitting: Internal Medicine

## 2023-06-16 VITALS — BP 110/70 | HR 92 | Temp 97.8°F | Ht 66.0 in | Wt 162.2 lb

## 2023-06-16 DIAGNOSIS — E7800 Pure hypercholesterolemia, unspecified: Secondary | ICD-10-CM

## 2023-06-16 DIAGNOSIS — I700 Atherosclerosis of aorta: Secondary | ICD-10-CM | POA: Diagnosis not present

## 2023-06-16 DIAGNOSIS — E1165 Type 2 diabetes mellitus with hyperglycemia: Secondary | ICD-10-CM | POA: Diagnosis not present

## 2023-06-16 DIAGNOSIS — E1169 Type 2 diabetes mellitus with other specified complication: Secondary | ICD-10-CM

## 2023-06-16 DIAGNOSIS — E785 Hyperlipidemia, unspecified: Secondary | ICD-10-CM | POA: Diagnosis not present

## 2023-06-16 DIAGNOSIS — I119 Hypertensive heart disease without heart failure: Secondary | ICD-10-CM

## 2023-06-16 DIAGNOSIS — R413 Other amnesia: Secondary | ICD-10-CM | POA: Diagnosis not present

## 2023-06-16 DIAGNOSIS — I10 Essential (primary) hypertension: Secondary | ICD-10-CM

## 2023-06-16 NOTE — Progress Notes (Unsigned)
Subjective:  Patient ID: [name] , female    DOB: Jun 19, 1946 , 77 y.o.   MRN: [id number]  Chief Complaint  Patient presents with   Diabetes   Hypertension    HPI  Patient presents today for a diabetes, chol & bp check. Patient reports compliance with meds. She denies headache, chest pain and worsening shortness of breath.  Patient has some concerns about the atorvastatin she feels like she is having "brain fog". She has read that cholesterol meds can cause this. She feels her short term memory has been affected.   Diabetes She presents for her follow-up diabetic visit. She has type 2 diabetes mellitus. Her disease course has been stable. Pertinent negatives for hypoglycemia include no dizziness or headaches. Pertinent negatives for diabetes include no blurred vision, no polydipsia, no polyphagia and no polyuria. There are no hypoglycemic complications. Risk factors for coronary artery disease include diabetes mellitus, dyslipidemia, hypertension, obesity, sedentary lifestyle and post-menopausal. She has not had a previous visit with a dietitian. She participates in exercise three times a week. Her breakfast blood glucose is taken between 8-9 am. Her breakfast blood glucose range is generally 110-130 mg/dl. Eye exam is current.  Hypertension This is a chronic problem. The current episode started more than 1 year ago. The problem has been gradually improving since onset. The problem is controlled. Pertinent negatives include no blurred vision or headaches. The current treatment provides moderate improvement. Compliance problems include exercise.      Past Medical History:  Diagnosis Date   Arthritis    Asthma    Complication of anesthesia    COPD (chronic obstructive pulmonary disease) (HCC)    Diabetes (HCC)    High cholesterol    History of radiation therapy    Right Lung- 12/04/21-12/11/21- Dr. Antony Blackbird   Hypertension    Lung cancer [facility])    Pneumonia 09/10/2017    PONV (postoperative nausea and vomiting)    Stroke (HCC) 2007   mini strokes     Family History  Problem Relation Age of Onset   Heart murmur Mother    Heart attack Father    Diabetes Father    Diabetes Brother      Current Outpatient Medications:    albuterol (VENTOLIN HFA) 108 (90 Base) MCG/ACT inhaler, INHALE 2 PUFFS INTO THE LUNGS EVERY 4 HOURS AS NEEDED FOR WHEEZING OR SHORTNESS OF BREATH, Disp: 18 g, Rfl: 12   aspirin 81 MG tablet, Take 81 mg by mouth daily., Disp: , Rfl:    atorvastatin (LIPITOR) 80 MG tablet, TAKE 1 TABLET BY MOUTH DAILY ON MONDAY-FRIDAY AND SKIP WEEKENDS., Disp: 90 tablet, Rfl: 1   Biotin 40981 MCG TBDP, Take 10,000 mcg by mouth daily., Disp: , Rfl:    bisoprolol (ZEBETA) 5 MG tablet, TAKE 1/2 TABLET BY MOUTH EVERY DAY, Disp: 45 tablet, Rfl: 3   Budeson-Glycopyrrol-Formoterol (BREZTRI AEROSPHERE) 160-9-4.8 MCG/ACT AERO, Inhale 2 puffs into the lungs 2 (two) times daily., Disp: 10.7 g, Rfl: 6   CALCIUM-MAGNESIUM-ZINC PO, Take 1 capsule by mouth at bedtime., Disp: , Rfl:    Cholecalciferol (VITAMIN D3) 2000 UNITS TABS, Take 2,000 Units by mouth at bedtime., Disp: , Rfl:    DULoxetine (CYMBALTA) 60 MG capsule, TAKE 1 CAPSULE(60 MG) BY MOUTH DAILY, Disp: 90 capsule, Rfl: 1   famotidine (PEPCID) 20 MG tablet, TAKE 1 TABLET BY MOUTH AFTER SUPPER, Disp: 90 tablet, Rfl: 0   glucose blood (ACCU-CHEK GUIDE) test strip, 1 each by Other route daily.  Use as instructed to check blood sugars 1 time per day dx: e11.22, Disp: 50 each, Rfl: 11   ipratropium-albuterol (DUONEB) 0.5-2.5 (3) MG/3ML SOLN, USE 3 ML VIA NEBULIZER EVERY 6 HOURS AS NEEDED, Disp: 180 mL, Rfl: 1   Magnesium 250 MG TABS, Take 1 tablet by mouth daily., Disp: , Rfl:    montelukast (SINGULAIR) 10 MG tablet, TAKE 1 TABLET(10 MG) BY MOUTH AT BEDTIME, Disp: 90 tablet, Rfl: 3   oxyCODONE-acetaminophen (PERCOCET) 10-325 MG tablet, Take 1 tablet by mouth 4 (four) times daily as needed., Disp: , Rfl:    pantoprazole  (PROTONIX) 40 MG tablet, TAKE 1 TABLET BY MOUTH 30 MINUTES BEFORE FIRST MEAL OF THE DAY, Disp: 90 tablet, Rfl: 2   pregabalin (LYRICA) 100 MG capsule, Take 100 mg by mouth 3 (three) times daily., Disp: , Rfl:    Semaglutide, 1 MG/DOSE, (OZEMPIC, 1 MG/DOSE,) 4 MG/3ML SOPN, Inject 1 mg into the skin once a week. Friday, Disp: 3 mL, Rfl: 3   Allergies  Allergen Reactions   Other Swelling    HAIR DYE     Review of Systems  Constitutional: Negative.   Eyes:  Negative for blurred vision.  Respiratory: Negative.    Cardiovascular: Negative.   Gastrointestinal: Negative.   Endocrine: Negative for polydipsia, polyphagia and polyuria.  Skin: Negative.   Neurological: Negative.  Negative for dizziness and headaches.     Today's Vitals   06/16/23 1503  BP: 110/70  Pulse: 92  Temp: 97.8 F (36.6 C)  Weight: 162 lb 3.2 oz (73.6 kg)  Height: 5\' 6"  (1.676 m)  PainSc: 0-No pain   Body mass index is 26.18 kg/m.  Wt Readings from Last 3 Encounters:  06/16/23 162 lb 3.2 oz (73.6 kg)  04/07/23 158 lb (71.7 kg)  03/05/23 149 lb 6.4 oz (67.8 kg)    The ASCVD Risk score (Arnett DK, et al., 2019) failed to calculate for the following reasons:   The patient has a prior MI or stroke diagnosis  Objective:  Physical Exam Vitals and nursing note reviewed.  Constitutional:      Appearance: Normal appearance.  HENT:     Head: Normocephalic and atraumatic.  Eyes:     Extraocular Movements: Extraocular movements intact.  Cardiovascular:     Rate and Rhythm: Normal rate and regular rhythm.     Heart sounds: Normal heart sounds.  Pulmonary:     Effort: Pulmonary effort is normal.     Breath sounds: Normal breath sounds.  Musculoskeletal:     Cervical back: Normal range of motion.  Skin:    General: Skin is warm.  Neurological:     General: No focal deficit present.     Mental Status: She is alert.  Psychiatric:        Mood and Affect: Mood normal.        Behavior: Behavior normal.          Assessment And Plan:  1. Dyslipidemia due to type 2 diabetes mellitus (HCC) Comments: She admits to not having much of an appetite. Poor nutrition can affect brain function.  She agrees to decrease Ozempic to 0.5mg  weekly, 36 clicks. - BMP8+EGFR - Hemoglobin A1c - AMB Referral to [facility]  2. Hypertensive heart disease without heart failure Comments: Chronic, well controlled. No med changes. Reminded to follow a low sodium diet.  3. Atherosclerosis of aorta (HCC) Comments: Chronic, LDL goal < 70.  She is currently on atorvastatin; however wants to consider  other non-statin options. Will send her to [facility]. - AMB Referral to [facility]  4. Memory changes Comments: Possibly due to poor nutrition due to decreased appetite. Will check labs as below. Will consider further testing if sx persist once off of atorvastatin. - CBC - TSH - Vitamin B12    Return in 4 months (on 10/16/2023) for controlled DM check-4 months.  Patient was given opportunity to ask questions. Patient verbalized understanding of the plan and was able to repeat key elements of the plan. All questions were answered to their satisfaction.   I, Gwynneth Aliment, MD, have reviewed all documentation for this visit. The documentation on 06/16/23 for the exam, diagnosis, procedures, and orders are all accurate and complete.   IF YOU HAVE BEEN REFERRED TO A SPECIALIST, IT MAY TAKE 1-2 WEEKS TO SCHEDULE/PROCESS THE REFERRAL. IF YOU HAVE NOT HEARD FROM US/SPECIALIST IN TWO WEEKS, PLEASE GIVE [place] A CALL AT [phone].

## 2023-06-16 NOTE — Patient Instructions (Signed)

## 2023-06-17 LAB — CBC
Hematocrit: 39.2 % (ref 34.0–46.6)
Hemoglobin: 13 g/dL (ref 11.1–15.9)
MCH: 30.4 pg (ref 26.6–33.0)
MCHC: 33.2 g/dL (ref 31.5–35.7)
MCV: 92 fL (ref 79–97)
Platelets: 310 10*3/uL (ref 150–450)
RBC: 4.28 x10E6/uL (ref 3.77–5.28)
RDW: 13.7 % (ref 11.7–15.4)
WBC: 4.6 10*3/uL (ref 3.4–10.8)

## 2023-06-17 LAB — TSH: TSH: 0.904 u[IU]/mL (ref 0.450–4.500)

## 2023-06-17 LAB — BMP8+EGFR
BUN/Creatinine Ratio: 13 (ref 12–28)
BUN: 10 mg/dL (ref 8–27)
CO2: 27 mmol/L (ref 20–29)
Calcium: 9.6 mg/dL (ref 8.7–10.3)
Chloride: 101 mmol/L (ref 96–106)
Creatinine, Ser: 0.8 mg/dL (ref 0.57–1.00)
Glucose: 53 mg/dL — ABNORMAL LOW (ref 70–99)
Potassium: 4.7 mmol/L (ref 3.5–5.2)
Sodium: 140 mmol/L (ref 134–144)
eGFR: 77 mL/min/{1.73_m2} (ref >59)

## 2023-06-17 LAB — VITAMIN B12: Vitamin B-12: 522 pg/mL (ref 232–1245)

## 2023-06-17 LAB — HEMOGLOBIN A1C
Est. average glucose Bld gHb Est-mCnc: 117 mg/dL
Hgb A1c MFr Bld: 5.7 % — ABNORMAL HIGH (ref 4.8–5.6)

## 2023-06-20 ENCOUNTER — Encounter: Payer: Self-pay | Admitting: Internal Medicine

## 2023-06-23 ENCOUNTER — Encounter: Payer: Self-pay | Admitting: Internal Medicine

## 2023-06-30 ENCOUNTER — Ambulatory Visit: Payer: Medicare HMO | Admitting: *Deleted

## 2023-06-30 DIAGNOSIS — E1165 Type 2 diabetes mellitus with hyperglycemia: Secondary | ICD-10-CM

## 2023-06-30 DIAGNOSIS — I10 Essential (primary) hypertension: Secondary | ICD-10-CM

## 2023-06-30 NOTE — Patient Instructions (Signed)
Please call the care guide team at [phone] if you need to cancel or reschedule your appointment.   If you are experiencing a Mental Health or Behavioral Health Crisis or need someone to talk to, please call the Suicide and Crisis Lifeline: 988 call the [place] National Suicide Prevention Lifeline: (734) 763-5511 or TTY: 9074433961 TTY (714)539-6503) to talk to a trained counselor call 1-800-273-TALK (toll free, 24 hour hotline) go to [facility] [address] [place] [phone]) call 911   Following is a copy of the CCM Program Consent:  CCM service includes personalized support from designated clinical staff supervised by the physician, including individualized plan of care and coordination with other care providers 24/7 contact phone numbers for assistance for urgent and routine care needs. Service will only be billed when office clinical staff spend 20 minutes or more in a month to coordinate care. Only one practitioner may furnish and bill the service in a calendar month. The patient may stop CCM services at amy time (effective at the end of the month) by phone call to the office staff. The patient will be responsible for cost sharing (co-pay) or up to 20% of the service fee (after annual deductible is met)  Following is a copy of your full provider care plan:   Goals Addressed             This Visit's Progress    COMPLETED: CCM Expected Outcome:  Monitor, Self-Manage and Reduce Symptoms of Diabetes       Current Barriers:  Knowledge Deficits related to the importance of checking blood sugars on a regular basis and the benefits of following a heart healthy/ADA diet Care Coordination needs related to pharm D support with effective medication management  in a patient with DM Chronic Disease Management support and education needs related to effective management of DM Patient reports she has a glucometer but does not monitor her blood  sugar, does not feel the need to do so Patient reports she continues following up with pain management Patient reports she is walking her dog some and [facility]  Planned Interventions: Reviewed medications with patient and discussed importance of medication adherence;        Reinforced prescribed diet with patient heart healthy/ADA diet/ carbohydrate modified. Counseled on importance of regular laboratory monitoring as prescribed;        Discussed plans with patient for ongoing care management follow up and provided patient with direct contact information for care management team;           Reviewed scheduled/upcoming provider appointments including: primary care provider in June         Advised patient, providing education and rationale, to check cbg once daily, when you have symptoms of low or high blood sugar, and encouraged the patient to take blood sugars on a regular basis for baseline status  and record        call provider for findings outside established parameters;            Review of patient status, including review of consultants reports, relevant laboratory and other test results, and medications completed;       Advised patient to discuss changes in DM, questions or concerns with provider;      Reinforced importance of exercising as you are able, gave encouragement  Symptom Management: Take medications as prescribed   Attend all scheduled provider appointments Call pharmacy for medication refills 3-7 days in advance of running out  of medications Perform all self care activities independently  Perform IADL's (shopping, preparing meals, housekeeping, managing finances) independently Call provider office for new concerns or questions  keep appointment with eye doctor check blood sugar at prescribed times: once daily and when you have symptoms of low or high blood sugar check feet daily for cuts, sores or redness enter blood sugar readings and medication or  insulin into daily log take the blood sugar log to all doctor visits take the blood sugar meter to all doctor visits trim toenails straight across fill half of plate with vegetables manage portion size prepare main meal at home 3 to 5 days each week wash and dry feet carefully every day wear comfortable, cotton socks wear comfortable, well-fitting shoes Continue to walk your dog when you are able [facility]  Follow Up Plan: No further follow up required: case closure         COMPLETED: CCM Expected Outcome:  Monitor, Self-Manage, and Reduce Symptoms of Hypertension       Current Barriers:  Chronic Disease Management support and education needs related to effective management of HTN Patient reports she got a new blood pressure cuff but has not been checking blood pressure  Planned Interventions: Evaluation of current treatment plan related to hypertension self management and patient's adherence to plan as established by provider;   Reviewed prescribed diet low sodium and importance of reading food labels Reviewed medications with patient and discussed importance of compliance;  Advised patient, providing education and rationale, to monitor blood pressure daily and record, calling PCP for findings outside established parameters;  Reviewed scheduled/upcoming provider appointments including: primary care provider Advised patient to discuss changes in blood pressure or heart health with provider; Reinforced importance of adherence of low sodium diet Discussed complications of poorly controlled blood pressure such as heart disease, stroke, circulatory complications, vision complications, kidney impairment, sexual dysfunction;  Reviewed plan of care with patient including case closure  Symptom Management: Take medications as prescribed   Attend all scheduled provider appointments Call pharmacy for medication refills 3-7 days in advance of running out of  medications Attend church or other social activities Perform all self care activities independently  Perform IADL's (shopping, preparing meals, housekeeping, managing finances) independently Call provider office for new concerns or questions  check blood pressure weekly choose a place to take my blood pressure (home, clinic or office, retail store) write blood pressure results in a log or diary learn about high blood pressure keep a blood pressure log call doctor for signs and symptoms of high blood pressure develop an action plan for high blood pressure keep all doctor appointments take medications for blood pressure exactly as prescribed report new symptoms to your doctor eat more whole grains, fruits and vegetables, lean meats and healthy fats Please consider checking blood pressure Please let your primary care provider know if you have any further needs for care management in the future  Follow Up Plan: No further follow up required: case closure             Patient verbalizes understanding of instructions and care plan provided today and agrees to view in MyChart. Active MyChart status and patient understanding of how to access instructions and care plan via MyChart confirmed with patient.  No further follow up required: Case closure

## 2023-06-30 NOTE — Chronic Care Management (AMB) (Signed)
Chronic Care Management   CCM RN Visit Note  06/30/2023 Name: [name] MRN: [id number] DOB: [birth year]  Subjective: [name] is a 77 y.o. year old female who is a primary care patient of Dorothyann Peng, MD. The patient was referred to the Chronic Care Management team for assistance with care management needs subsequent to provider initiation of CCM services and plan of care.    Today's Visit:  Engaged with patient by telephone for follow up visit.        Goals Addressed             This Visit's Progress    COMPLETED: CCM Expected Outcome:  Monitor, Self-Manage and Reduce Symptoms of Diabetes       Current Barriers:  Knowledge Deficits related to the importance of checking blood sugars on a regular basis and the benefits of following a heart healthy/ADA diet Care Coordination needs related to pharm D support with effective medication management  in a patient with DM Chronic Disease Management support and education needs related to effective management of DM Patient reports she has a glucometer but does not monitor her blood sugar, does not feel the need to do so Patient reports she continues following up with pain management Patient reports she is walking her dog some and walking at the senior center  Planned Interventions: Reviewed medications with patient and discussed importance of medication adherence;        Reinforced prescribed diet with patient heart healthy/ADA diet/ carbohydrate modified. Counseled on importance of regular laboratory monitoring as prescribed;        Discussed plans with patient for ongoing care management follow up and provided patient with direct contact information for care management team;           Reviewed scheduled/upcoming provider appointments including: primary care provider in June         Advised patient, providing education and rationale, to check cbg once daily, when you have symptoms of low or high blood sugar, and encouraged  the patient to take blood sugars on a regular basis for baseline status  and record        call provider for findings outside established parameters;            Review of patient status, including review of consultants reports, relevant laboratory and other test results, and medications completed;       Advised patient to discuss changes in DM, questions or concerns with provider;      Reinforced importance of exercising as you are able, gave encouragement  Symptom Management: Take medications as prescribed   Attend all scheduled provider appointments Call pharmacy for medication refills 3-7 days in advance of running out of medications Perform all self care activities independently  Perform IADL's (shopping, preparing meals, housekeeping, managing finances) independently Call provider office for new concerns or questions  keep appointment with eye doctor check blood sugar at prescribed times: once daily and when you have symptoms of low or high blood sugar check feet daily for cuts, sores or redness enter blood sugar readings and medication or insulin into daily log take the blood sugar log to all doctor visits take the blood sugar meter to all doctor visits trim toenails straight across fill half of plate with vegetables manage portion size prepare main meal at home 3 to 5 days each week wash and dry feet carefully every day wear comfortable, cotton socks wear comfortable, well-fitting shoes Continue to walk your dog when you  are able Continue to walk at [facility]  Follow Up Plan: No further follow up required: case closure         COMPLETED: CCM Expected Outcome:  Monitor, Self-Manage, and Reduce Symptoms of Hypertension       Current Barriers:  Chronic Disease Management support and education needs related to effective management of HTN Patient reports she got a new blood pressure cuff but has not been checking blood pressure  Planned Interventions: Evaluation of current  treatment plan related to hypertension self management and patient's adherence to plan as established by provider;   Reviewed prescribed diet low sodium and importance of reading food labels Reviewed medications with patient and discussed importance of compliance;  Advised patient, providing education and rationale, to monitor blood pressure daily and record, calling PCP for findings outside established parameters;  Reviewed scheduled/upcoming provider appointments including: primary care provider Advised patient to discuss changes in blood pressure or heart health with provider; Reinforced importance of adherence of low sodium diet Discussed complications of poorly controlled blood pressure such as heart disease, stroke, circulatory complications, vision complications, kidney impairment, sexual dysfunction;  Reviewed plan of care with patient including case closure  Symptom Management: Take medications as prescribed   Attend all scheduled provider appointments Call pharmacy for medication refills 3-7 days in advance of running out of medications Attend church or other social activities Perform all self care activities independently  Perform IADL's (shopping, preparing meals, housekeeping, managing finances) independently Call provider office for new concerns or questions  check blood pressure weekly choose a place to take my blood pressure (home, clinic or office, retail store) write blood pressure results in a log or diary learn about high blood pressure keep a blood pressure log call doctor for signs and symptoms of high blood pressure develop an action plan for high blood pressure keep all doctor appointments take medications for blood pressure exactly as prescribed report new symptoms to your doctor eat more whole grains, fruits and vegetables, lean meats and healthy fats Please consider checking blood pressure Please let your primary care provider know if you have any further  needs for care management in the future  Follow Up Plan: No further follow up required: case closure             Plan:No further follow up required: case closure  Irving Shows [facility], BSN RN Case Manager [facility] [phone]

## 2023-07-20 ENCOUNTER — Other Ambulatory Visit: Payer: Self-pay | Admitting: Internal Medicine

## 2023-07-29 ENCOUNTER — Telehealth: Payer: Self-pay

## 2023-07-29 NOTE — Telephone Encounter (Signed)
Spoke with patient. PA ready for pick up.

## 2023-07-30 DIAGNOSIS — E1159 Type 2 diabetes mellitus with other circulatory complications: Secondary | ICD-10-CM | POA: Diagnosis not present

## 2023-07-30 DIAGNOSIS — I10 Essential (primary) hypertension: Secondary | ICD-10-CM | POA: Diagnosis not present

## 2023-08-01 DIAGNOSIS — C3491 Malignant neoplasm of unspecified part of right bronchus or lung: Secondary | ICD-10-CM | POA: Diagnosis not present

## 2023-08-01 DIAGNOSIS — G894 Chronic pain syndrome: Secondary | ICD-10-CM | POA: Diagnosis not present

## 2023-08-01 DIAGNOSIS — Z79899 Other long term (current) drug therapy: Secondary | ICD-10-CM | POA: Diagnosis not present

## 2023-08-01 DIAGNOSIS — M25511 Pain in right shoulder: Secondary | ICD-10-CM | POA: Diagnosis not present

## 2023-08-01 DIAGNOSIS — G629 Polyneuropathy, unspecified: Secondary | ICD-10-CM | POA: Diagnosis not present

## 2023-08-04 ENCOUNTER — Encounter: Payer: Self-pay | Admitting: Internal Medicine

## 2023-08-04 ENCOUNTER — Other Ambulatory Visit: Payer: Self-pay

## 2023-08-05 ENCOUNTER — Other Ambulatory Visit: Payer: Self-pay

## 2023-08-05 MED ORDER — BREZTRI AEROSPHERE 160-9-4.8 MCG/ACT IN AERO: 2.0000 | INHALATION_SPRAY | Freq: Two times a day (BID) | RESPIRATORY_TRACT | 6 refills | Status: DC

## 2023-08-29 DIAGNOSIS — G8929 Other chronic pain: Secondary | ICD-10-CM | POA: Diagnosis not present

## 2023-08-29 DIAGNOSIS — M25511 Pain in right shoulder: Secondary | ICD-10-CM | POA: Diagnosis not present

## 2023-08-29 DIAGNOSIS — C3491 Malignant neoplasm of unspecified part of right bronchus or lung: Secondary | ICD-10-CM | POA: Diagnosis not present

## 2023-08-29 DIAGNOSIS — Z79899 Other long term (current) drug therapy: Secondary | ICD-10-CM | POA: Diagnosis not present

## 2023-08-29 DIAGNOSIS — G894 Chronic pain syndrome: Secondary | ICD-10-CM | POA: Diagnosis not present

## 2023-08-29 DIAGNOSIS — G629 Polyneuropathy, unspecified: Secondary | ICD-10-CM | POA: Diagnosis not present

## 2023-08-30 ENCOUNTER — Other Ambulatory Visit: Payer: Self-pay | Admitting: Internal Medicine

## 2023-09-04 ENCOUNTER — Other Ambulatory Visit: Payer: Self-pay | Admitting: Internal Medicine

## 2023-09-13 ENCOUNTER — Encounter: Payer: Self-pay | Admitting: Internal Medicine

## 2023-09-15 ENCOUNTER — Other Ambulatory Visit: Payer: Self-pay

## 2023-09-15 DIAGNOSIS — E1165 Type 2 diabetes mellitus with hyperglycemia: Secondary | ICD-10-CM

## 2023-09-15 MED ORDER — PEN NEEDLES 32G X 4 MM MISC
2 refills | Status: DC
Start: 2023-09-15 — End: 2024-02-18

## 2023-09-17 ENCOUNTER — Telehealth: Payer: Self-pay | Admitting: *Deleted

## 2023-09-17 NOTE — Telephone Encounter (Signed)
CALLED PATIENT TO INFORM OF CT FOR 10-07-23- ARRIVAL TIME- 9:15 AM @ [facility] NO RESTRICTIONS TO SCAN, PATIENT TO RECEIVE CT RESULTS FROM   DR. KINARD ON 10-09-23 @ 11:20 AM, LVM FOR A RETURN CALL

## 2023-10-06 ENCOUNTER — Ambulatory Visit: Payer: Medicare HMO | Admitting: Pulmonary Disease

## 2023-10-06 ENCOUNTER — Encounter: Payer: Self-pay | Admitting: Pulmonary Disease

## 2023-10-06 VITALS — BP 126/62 | HR 76 | Temp 97.3°F | Ht 66.0 in | Wt 167.4 lb

## 2023-10-06 DIAGNOSIS — J440 Chronic obstructive pulmonary disease with acute lower respiratory infection: Secondary | ICD-10-CM

## 2023-10-06 DIAGNOSIS — J449 Chronic obstructive pulmonary disease, unspecified: Secondary | ICD-10-CM | POA: Diagnosis not present

## 2023-10-06 DIAGNOSIS — R0602 Shortness of breath: Secondary | ICD-10-CM | POA: Diagnosis not present

## 2023-10-06 DIAGNOSIS — J209 Acute bronchitis, unspecified: Secondary | ICD-10-CM

## 2023-10-06 MED ORDER — AMOXICILLIN-POT CLAVULANATE 875-125 MG PO TABS
1.0000 | ORAL_TABLET | Freq: Two times a day (BID) | ORAL | 0 refills | Status: DC
Start: 2023-10-06 — End: 2023-10-20

## 2023-10-06 MED ORDER — PREDNISONE 20 MG PO TABS: 20.0000 mg | ORAL_TABLET | Freq: Every day | ORAL | 0 refills | Status: DC

## 2023-10-06 NOTE — Patient Instructions (Signed)
Prescription for antibiotic-Augmentin and prednisone will be sent to pharmacy for you  Continue inhalers  Call us with significant concerns  Follow-up in 6 months

## 2023-10-06 NOTE — Progress Notes (Signed)
[name]    [id number]    1946/10/20  Primary Care Physician:[name], Melina Schools, MD  Referring Physician: Dorothyann Peng, MD [address] [place],  [place] [address]  Chief complaint:   Cough and shortness of breath  HPI:  Patient being followed for cough and shortness of breath  History of Gold 2 COPD followed up with Dr. Sherene Sires  Had COVID about 4 weeks ago  Symptoms improved and then started having a productive cough, increasing shortness of breath with chest tightness  Has not had any recent exacerbation of her COPD recently  Compliant with Breztri  Quit smoking in 2007    Outpatient Encounter Medications as of 10/06/2023  Medication Sig   albuterol (VENTOLIN HFA) 108 (90 Base) MCG/ACT inhaler INHALE 2 PUFFS INTO THE LUNGS EVERY 4 HOURS AS NEEDED FOR WHEEZING OR SHORTNESS OF BREATH   amoxicillin-clavulanate (AUGMENTIN) 875-125 MG tablet Take 1 tablet by mouth 2 (two) times daily.   aspirin 81 MG tablet Take 81 mg by mouth daily.   atorvastatin (LIPITOR) 80 MG tablet TAKE 1 TABLET BY MOUTH DAILY ON MONDAY-FRIDAY AND SKIP WEEKENDS.   Biotin 19147 MCG TBDP Take 10,000 mcg by mouth daily.   bisoprolol (ZEBETA) 5 MG tablet TAKE 1/2 TABLET BY MOUTH EVERY DAY   Budeson-Glycopyrrol-Formoterol (BREZTRI AEROSPHERE) 160-9-4.8 MCG/ACT AERO Inhale 2 puffs into the lungs 2 (two) times daily.   CALCIUM-MAGNESIUM-ZINC PO Take 1 capsule by mouth at bedtime.   Cholecalciferol (VITAMIN D3) 2000 UNITS TABS Take 2,000 Units by mouth at bedtime.   DULoxetine (CYMBALTA) 60 MG capsule TAKE 1 CAPSULE(60 MG) BY MOUTH DAILY   glucose blood (ACCU-CHEK GUIDE) test strip 1 each by Other route daily. Use as instructed to check blood sugars 1 time per day dx: e11.22   Insulin Pen Needle (PEN NEEDLES) 32G X 4 MM MISC Use as directed E11.69   ipratropium-albuterol (DUONEB) 0.5-2.5 (3) MG/3ML SOLN USE 3 ML VIA NEBULIZER EVERY 6 HOURS AS NEEDED   Magnesium 250 MG TABS Take 1  tablet by mouth daily.   montelukast (SINGULAIR) 10 MG tablet TAKE 1 TABLET(10 MG) BY MOUTH AT BEDTIME   oxyCODONE-acetaminophen (PERCOCET) 10-325 MG tablet Take 1 tablet by mouth 4 (four) times daily as needed.   pantoprazole (PROTONIX) 40 MG tablet TAKE 1 TABLET BY MOUTH 30 MINUTES BEFORE FIRST MEAL OF THE DAY   predniSONE (DELTASONE) 20 MG tablet Take 1 tablet (20 mg total) by mouth daily with breakfast.   pregabalin (LYRICA) 100 MG capsule Take 100 mg by mouth 3 (three) times daily.   Semaglutide, 1 MG/DOSE, (OZEMPIC, 1 MG/DOSE,) 4 MG/3ML SOPN Inject 1 mg into the skin once a week. Friday   famotidine (PEPCID) 20 MG tablet TAKE 1 TABLET BY MOUTH AFTER SUPPER (Patient not taking: Reported on 10/06/2023)   No facility-administered encounter medications on file as of 10/06/2023.    Allergies as of 10/06/2023 - Review Complete 10/06/2023  Allergen Reaction Noted   Other Swelling 09/10/2017    Past Medical History:  Diagnosis Date   Arthritis    Asthma    Complication of anesthesia    COPD (chronic obstructive pulmonary disease) (HCC)    Diabetes (HCC)    High cholesterol    History of radiation therapy    Right Lung- 12/04/21-12/11/21- Dr. Antony Blackbird   Hypertension    Lung cancer [facility])    Pneumonia 09/10/2017   PONV (postoperative nausea and vomiting)    Stroke (  HCC) 2007   mini strokes    Past Surgical History:  Procedure Laterality Date   ABDOMINAL HYSTERECTOMY  1995   BRONCHIAL BIOPSY  10/23/2021   Procedure: BRONCHIAL BIOPSIES;  Surgeon: Josephine Igo, DO;  Location: [facility];  Service: Pulmonary;;   BRONCHIAL BRUSHINGS  10/23/2021   Procedure: BRONCHIAL BRUSHINGS;  Surgeon: Josephine Igo, DO;  Location: [facility];  Service: Pulmonary;;   BRONCHIAL NEEDLE ASPIRATION BIOPSY  10/23/2021   Procedure: BRONCHIAL NEEDLE ASPIRATION BIOPSIES;  Surgeon: Josephine Igo, DO;  Location: [facility];  Service: Pulmonary;;   carpel tunnel surgery Right 2006    COLONOSCOPY  2014   COLOSTOMY  2007   colostomy let down  2008   endoscopy  2014   FIDUCIAL MARKER PLACEMENT  10/23/2021   Procedure: FIDUCIAL MARKER PLACEMENT;  Surgeon: Josephine Igo, DO;  Location: [facility];  Service: Pulmonary;;   rotator cuff tear Left 04/14/2020   VIDEO BRONCHOSCOPY WITH ENDOBRONCHIAL NAVIGATION Right 10/23/2021   Procedure: VIDEO BRONCHOSCOPY WITH ENDOBRONCHIAL NAVIGATION;  Surgeon: Josephine Igo, DO;  Location: [facility];  Service: Pulmonary;  Laterality: Right;  ION, w/ fiducial placement   VIDEO BRONCHOSCOPY WITH RADIAL ENDOBRONCHIAL ULTRASOUND  10/23/2021   Procedure: VIDEO BRONCHOSCOPY WITH RADIAL ENDOBRONCHIAL ULTRASOUND;  Surgeon: Josephine Igo, DO;  Location: [facility];  Service: Pulmonary;;    Family History  Problem Relation Age of Onset   Heart murmur Mother    Heart attack Father    Diabetes Father    Diabetes Brother     Social History   Socioeconomic History   Marital status: Married    Spouse name: Not on file   Number of children: Not on file   Years of education: Not on file   Highest education level: Not on file  Occupational History   Occupation: Retired    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  Tobacco Use   Smoking status: Former    Current packs/day: 0.00    Average packs/day: 0.5 packs/day for 42.0 years (21.0 ttl pk-yrs)    Types: Cigarettes    Start date: 01/30/1967    Quit date: 01/30/2006    Years since quitting: 17.6   Smokeless tobacco: Never   Tobacco comments:    intermittent cycles b/w 1/4-1/2ppd  Vaping Use   Vaping status: Never Used  Substance and Sexual Activity   Alcohol use: Not Currently    Comment: 1 drink a year, maybe   Drug use: No   Sexual activity: Not Currently  Other Topics Concern   Not on file  Social History Narrative   Not on file   Social Determinants of Health   Financial Resource Strain: Low Risk  (06/12/2023)   Overall Financial Resource Strain  (CARDIA)    Difficulty of Paying Living Expenses: Not very hard  Food Insecurity: No Food Insecurity (06/12/2023)   Hunger Vital Sign    Worried About Running Out of Food in the Last Year: Never true    Ran Out of Food in the Last Year: Never true  Transportation Needs: No Transportation Needs (06/12/2023)   PRAPARE - Administrator, Civil Service (Medical): No    Lack of Transportation (Non-Medical): No  Physical Activity: Insufficiently Active (06/12/2023)   Exercise Vital Sign    Days of Exercise per Week: 2 days    Minutes of Exercise per Session: 30 min  Stress: Stress Concern Present (06/12/2023)   [employer] of Occupational Health -  Occupational Stress Questionnaire    Feeling of Stress : To some extent  Social Connections: Moderately Integrated (06/12/2023)   Social Connection and Isolation Panel [NHANES]    Frequency of Communication with Friends and Family: Once a week    Frequency of Social Gatherings with Friends and Family: Once a week    Attends Religious Services: More than 4 times per year    Active Member of [employer] or Organizations: Yes    Attends Engineer, structural: More than 4 times per year    Marital Status: Married  Catering manager Violence: Not At Risk (02/07/2023)   Humiliation, Afraid, Rape, and Kick questionnaire    Fear of Current or Ex-Partner: No    Emotionally Abused: No    Physically Abused: No    Sexually Abused: No    Review of Systems  Respiratory:  Positive for cough and shortness of breath.     Vitals:   10/06/23 1317  BP: 126/62  Pulse: 76  Temp: (!) 97.3 F (36.3 C)  SpO2: 94%     Physical Exam Constitutional:      Appearance: Normal appearance.  HENT:     Head: Normocephalic.     Mouth/Throat:     Mouth: Mucous membranes are moist.  Eyes:     General: No scleral icterus. Cardiovascular:     Rate and Rhythm: Normal rate and regular rhythm.     Heart sounds: No murmur heard.    No friction rub.   Pulmonary:     Effort: No respiratory distress.     Breath sounds: No stridor. No rhonchi.  Musculoskeletal:     Cervical back: No rigidity or tenderness.  Neurological:     Mental Status: She is alert.  Psychiatric:        Mood and Affect: Mood normal.      Data Reviewed: Spirometry from 2018 with moderate obstructive disease  Assessment:  Bronchitis  Recent COVID about 4 weeks ago    Plan/Recommendations: Prescription for Augmentin twice daily for 10 days  Prescription for prednisone 20 mg for 7 to 10 days  Call us with significant concerns  Follow-up in 6 months   Virl Diamond MD [facility] 10/06/2023, 8:38 PM  CC: Dorothyann Peng, MD

## 2023-10-06 NOTE — Progress Notes (Deleted)
Subjective:   Patient ID: [name], female    DOB: 03-15-1946    MRN: [id number]    Brief patient profile:  25 yobf mother of a PA @ [place]  fast med  quit smoking 2007 with chronic cough x 2011 self referred for evaluation of persistent cough in setting of technically GOLD II copd dx 02/2014 with only mild obstructive pattern - has noted since high school a tendency to daily  tickle in throat causing intermittent cough waxing and waning ever since    History of Present Illness  10/19/2014 1st [facility] office visit/ [facility]   Chief Complaint  Patient presents with   Pulmonary Consult    Former pt of Dr. Maple Hudson. Pt c/o increased cough x 3 wks. Cough is prod with moderate, thick, light yellow sputum.  She coughs until loses urinary continence.  Not sleeping well due to cough.   remembers having tendency to throat tickle in high school  but completely resolved by graduation  then recurred in her 51s and resolved with codeine then recurred in 2011  but bad to worse x 3 weeks assoc with sensation of choking, mucus is thick light yellow.  acei stopped about a month prior to OV  Replaced by losartan  Inhalers make it worse, esp dpi  Kouffman Reflux v Neurogenic Cough Differentiator Reflux Comments  Do you awaken from a sound sleep coughing violently?                            With trouble breathing? Yes   Do you have choking episodes when you cannot  Get enough air, gasping for air ?              Yes   Do you usually cough when you lie down into  The bed, or when you just lie down to rest ?                          Yes   Do you usually cough after meals or eating?         maybe   Do you cough when (or after) you bend over?    No    GERD SCORE     Kouffman Reflux v Neurogenic Cough Differentiator Neurogenic   Do you more-or-less cough all day long? yes   Does change of temperature make you cough? no   Does laughing or chuckling cause you to cough? no   Do fumes (perfume,  automobile fumes, burned  Toast, etc.,) cause you to cough ?      Not aware   Does speaking, singing, or talking on the phone cause you to cough   ?               sometimes   Neurogenic/Airway score      rec  Max rx gerd/prn tramadol        09/15/2017  Transition of care  f/u ov/Wert re: COPD II s/p aecopd Chief Complaint  Patient presents with   Hospitalization Follow-up    Breathing has improved some, but not back at her normal baseline. She states her cough woke her up this am and had some bloody nasal d/c.  She has been coughing up some thick, yellow sputum.  She is using   walking all over [place] fine,  Flew back to [place]  on a commercial jet and short of  breath  walking from gate [place] then by 2 days later in ER due to severe sob / tightness across upper back >> albuterol relived the discomfort w/in 5 min but did not have her rescue so had to call 911 (proair listed on med calendar she just got from me 08/19/17 under written action plan) - says was so short of breath at that point could not have used saba anyway. Not following action plan either re controlling cough which is also worse/ has not started meds from discharge  rec Plan A = Automatic = symbicort 80 Take 2 puffs first thing in am and then another 2 puffs about 12 hours later.  Work on Horticulturist, commercial: Plan B = Backup Only use your albuterol as a rescue medication        11/21/2020  f/u ov/[facility]ert re:  GOLD II / breztri better than symbicort  Chief Complaint  Patient presents with   Follow-up    She states she has alot of SOB when she wakes up in the morning. She rarely uses her albuterol inhaler, but has been using neb almost every night.   Dyspnea:  Gets up around 630 am does better after taking am breztri   Cough: sporadic but dry  / less need for tramadol on gabapentin usually just taking 300 bid  Sleeping: fine 2 pillows  SABA use: every other night sev hours p supper  feels like needs  nebulizer even p pm dose of breztri 02: none rec Change gabapentin to 300 mg 4 x daily  Change pepcid 20 mg after supper  Only use your albuterol as a rescue medication  Bed blocks are strongly recommended      07/23/21 televisit Plan A = Automatic = Always=    Breztri Take 2 puffs first thing in am and then another 2 puffs about 12 hours later.  Gabapentin 300 mg four times daily until cough is gone then taper twice daily.  Plan B = Backup (to supplement plan A, not to replace it) Only use your albuterol inhaler as a rescue medication  Plan C = Crisis (instead of Plan B but only if Plan B stops working) - only use your albuterol nebulizer if you first try Plan B and it fails to help > ok to use the nebulizer up to every 4 hours but if start needing it regularly call for immediate appointment Prednisone 10 mg take  4 each am x 2 days,   2 each am x 2 days,  1 each am x 2 days and stop  Please schedule a follow up office visit in 6 weeks, call sooner if needed - bring all your medications /inhalers/ solutions Check cxr  08/05/21 ? ILD > HRCT > RUL nodule > PET pos stage I  > adeno ca   RT curative  12/04/2021 through 12/11/2021  10/06/2023 acute extended ov/Wert re: sob/cough/ wheeze maint on breztri 2 bid  Chief Complaint  Patient presents with   Acute Visit    Wheeze, cough and dyspnea x 2 weeks  Worse x 3 days / no recent  prednisone assoc with worse cough x sev weeks silvery mucus no blood / assoc subjective wheeze/ no obvious assoc rhinitis / mild dysphagia but no worse p meals  L cp now seeing neurology / pain clinic / not pleuritic   No obvious day to day or daytime variability or assoc  mucus plugs or hemoptysis or cp or chest tightness, subjective wheeze  or overt sinus or hb symptoms.    Also denies any obvious fluctuation of symptoms with weather or environmental changes or other aggravating or alleviating factors except as outlined above   No unusual exposure hx or h/o  childhood pna/ asthma or knowledge of premature birth.  Current Allergies, Complete Past Medical History, Past Surgical History, Family History, and Social History were reviewed in [employer] record.  ROS  The following are not active complaints unless bolded Hoarseness, sore throat, dysphagia, dental problems, itching, sneezing,  nasal congestion or discharge of excess mucus or purulent secretions, ear ache,   fever, chills, sweats, unintended wt loss or wt gain, classically pleuritic or exertional cp,  orthopnea pnd or arm/hand swelling  or leg swelling, presyncope, palpitations, abdominal pain, anorexia, nausea, vomiting, diarrhea  or change in bowel habits or change in bladder habits, change in stools or change in urine, dysuria, hematuria,  rash, arthralgias, visual complaints, headache, numbness, weakness or ataxia or problems with walking or coordination,  change in mood or  memory.                             Objective:   Physical Exam  10/06/2023        185 09/13/2021      197   02/26/2021      191 11/21/2020    191  04/16/2019      193 07/22/2018      195 04/20/2018      189  10/23/2016     190 04/07/2015         207     Vital signs reviewed  10/06/2023  - Note at rest 02 sats  95% on RA   General appearance:    amb bf mod hoarse/ nad   HEENT : nl exam    NECK :  without JVD/Nodes/TM/ nl carotid upstrokes bilaterally   LUNGS: no acc muscle use,  Mod barrel  contour chest wall with bilateral  insp/exp rhonchi s audible wheeze and  without cough on insp or exp maneuvers and mod  Hyperresonant  to  percussion bilaterally     CV:  RRR  no s3 or murmur or increase in P2, and no edema   ABD:  soft and nontender with pos mid insp Hoover's  in the supine position. No bruits or organomegaly appreciated, bowel sounds nl  MS:     ext warm without deformities, calf tenderness, cyanosis or clubbing No obvious joint restrictions   SKIN: warm and dry  without lesions    NEURO:  alert, approp, nl sensorium with  no motor or cerebellar deficits apparent.       CXR PA and Lateral:   10/06/2023 :    I personally reviewed images and agree with radiology impression as follows:    Bibasilar opacities favored to represent atelectasis. No acute process.

## 2023-10-07 ENCOUNTER — Ambulatory Visit (HOSPITAL_COMMUNITY)
Admission: RE | Admit: 2023-10-07 | Discharge: 2023-10-07 | Disposition: A | Discharge status: Home / Self Care | Payer: Medicare HMO | Source: Ambulatory Visit | Attending: Radiation Oncology | Admitting: Radiation Oncology

## 2023-10-07 ENCOUNTER — Encounter: Payer: Self-pay | Admitting: Pulmonary Disease

## 2023-10-07 DIAGNOSIS — I10 Essential (primary) hypertension: Secondary | ICD-10-CM | POA: Diagnosis not present

## 2023-10-07 DIAGNOSIS — J432 Centrilobular emphysema: Secondary | ICD-10-CM | POA: Diagnosis not present

## 2023-10-07 DIAGNOSIS — R911 Solitary pulmonary nodule: Secondary | ICD-10-CM | POA: Diagnosis not present

## 2023-10-07 DIAGNOSIS — C3490 Malignant neoplasm of unspecified part of unspecified bronchus or lung: Secondary | ICD-10-CM | POA: Diagnosis not present

## 2023-10-07 DIAGNOSIS — I700 Atherosclerosis of aorta: Secondary | ICD-10-CM | POA: Diagnosis not present

## 2023-10-07 DIAGNOSIS — C3491 Malignant neoplasm of unspecified part of right bronchus or lung: Secondary | ICD-10-CM | POA: Diagnosis not present

## 2023-10-07 DIAGNOSIS — R590 Localized enlarged lymph nodes: Secondary | ICD-10-CM | POA: Diagnosis not present

## 2023-10-07 LAB — POCT I-STAT CREATININE: Creatinine, Ser: 0.9 mg/dL (ref 0.44–1.00)

## 2023-10-07 MED ORDER — IOHEXOL 300 MG/ML  SOLN
75.0000 mL | Freq: Once | INTRAMUSCULAR | Status: AC | PRN
  Administered 2023-10-07: 75 mL via INTRAVENOUS

## 2023-10-08 NOTE — Progress Notes (Signed)
Radiation Oncology         [phone] ________________________________  Name: [name] MRN: [id number]  Date: 10/09/2023  DOB: Jul 23, 1946  Follow-Up Visit Note  CC: Dorothyann Peng, MD  Josephine Igo, DO  No diagnosis found.  Diagnosis: Adenocarcinoma - RUL pulmonary nodule, clinical stage I   Interval Since Last Radiation: 1 year, 9 months, and 27 days   Intent: Curative  Radiation Treatment Dates: 12/04/2021 through 12/11/2021 Site Technique Total Dose (Gy) Dose per Fx (Gy) Completed Fx Beam Energies  Lung, Right: Lung_Rt IMRT 54/54 18 3/3 6XFFF   Narrative:  The patient returns today for routine follow-up. She was last seen here for follow-up on 04/07/23.           Since her last visit, she followed up with Dr. Wynona Neat at [facility] on 10/06/23. She endorsed cough and SOB at that time and was diagnosed with bronchitis. She was accordingly prescribed a coursed of Augmentin and prednisone.   Her most recent chest CT performed on 10/07/23 shows a slight increase in the RUL irregular linear consolidations, likely representing evolving postradiation changes. The irregular solid nodule in the RML and enlarged right hilar lymph nodes otherwise appear stable.   No other significant oncologic interval history since she was last seen for follow-up.                ***         Allergies:  is allergic to other.  Meds: Current Outpatient Medications  Medication Sig Dispense Refill   albuterol (VENTOLIN HFA) 108 (90 Base) MCG/ACT inhaler INHALE 2 PUFFS INTO THE LUNGS EVERY 4 HOURS AS NEEDED FOR WHEEZING OR SHORTNESS OF BREATH 18 g 12   amoxicillin-clavulanate (AUGMENTIN) 875-125 MG tablet Take 1 tablet by mouth 2 (two) times daily. 20 tablet 0   aspirin 81 MG tablet Take 81 mg by mouth daily.     atorvastatin (LIPITOR) 80 MG tablet TAKE 1 TABLET BY MOUTH DAILY ON MONDAY-FRIDAY AND SKIP WEEKENDS. 90 tablet 1   Biotin 03474 MCG TBDP Take 10,000 mcg by mouth daily.      bisoprolol (ZEBETA) 5 MG tablet TAKE 1/2 TABLET BY MOUTH EVERY DAY 45 tablet 3   Budeson-Glycopyrrol-Formoterol (BREZTRI AEROSPHERE) 160-9-4.8 MCG/ACT AERO Inhale 2 puffs into the lungs 2 (two) times daily. 10.7 g 6   CALCIUM-MAGNESIUM-ZINC PO Take 1 capsule by mouth at bedtime.     Cholecalciferol (VITAMIN D3) 2000 UNITS TABS Take 2,000 Units by mouth at bedtime.     DULoxetine (CYMBALTA) 60 MG capsule TAKE 1 CAPSULE(60 MG) BY MOUTH DAILY 90 capsule 1   famotidine (PEPCID) 20 MG tablet TAKE 1 TABLET BY MOUTH AFTER SUPPER (Patient not taking: Reported on 10/06/2023) 90 tablet 0   glucose blood (ACCU-CHEK GUIDE) test strip 1 each by Other route daily. Use as instructed to check blood sugars 1 time per day dx: e11.22 50 each 11   Insulin Pen Needle (PEN NEEDLES) 32G X 4 MM MISC Use as directed E11.69 100 each 2   ipratropium-albuterol (DUONEB) 0.5-2.5 (3) MG/3ML SOLN USE 3 ML VIA NEBULIZER EVERY 6 HOURS AS NEEDED 180 mL 1   Magnesium 250 MG TABS Take 1 tablet by mouth daily.     montelukast (SINGULAIR) 10 MG tablet TAKE 1 TABLET(10 MG) BY MOUTH AT BEDTIME 90 tablet 3   oxyCODONE-acetaminophen (PERCOCET) 10-325 MG tablet Take 1 tablet by mouth 4 (four) times daily as needed.     pantoprazole (PROTONIX) 40 MG  tablet TAKE 1 TABLET BY MOUTH 30 MINUTES BEFORE FIRST MEAL OF THE DAY 90 tablet 2   predniSONE (DELTASONE) 20 MG tablet Take 1 tablet (20 mg total) by mouth daily with breakfast. 10 tablet 0   pregabalin (LYRICA) 100 MG capsule Take 100 mg by mouth 3 (three) times daily.     Semaglutide, 1 MG/DOSE, (OZEMPIC, 1 MG/DOSE,) 4 MG/3ML SOPN Inject 1 mg into the skin once a week. Friday 3 mL 3   No current facility-administered medications for this encounter.    Physical Findings: The patient is in no acute distress. Patient is alert and oriented.  vitals were not taken for this visit. .  No significant changes. Lungs are clear to auscultation bilaterally. Heart has regular rate and rhythm. No  palpable cervical, supraclavicular, or axillary adenopathy. Abdomen soft, non-tender, normal bowel sounds.   Lab Findings: Lab Results  Component Value Date   WBC 4.6 06/16/2023   HGB 13.0 06/16/2023   HCT 39.2 06/16/2023   MCV 92 06/16/2023   PLT 310 06/16/2023    Radiographic Findings: CT Chest W Contrast  Result Date: 10/07/2023 CLINICAL DATA:  Non-small cell lung cancer; * Tracking Code: BO * EXAM: CT CHEST WITH CONTRAST TECHNIQUE: Multidetector CT imaging of the chest was performed during intravenous contrast administration. RADIATION DOSE REDUCTION: This exam was performed according to the departmental dose-optimization program which includes automated exposure control, adjustment of the mA and/or kV according to patient size and/or use of iterative reconstruction technique. CONTRAST:  75mL OMNIPAQUE IOHEXOL 300 MG/ML  SOLN COMPARISON:  Multiple priors, most recent chest CT dated April 03, 2023 FINDINGS: Cardiovascular: Normal heart size. No No pericardial effusion. Normal caliber thoracic aorta with moderate atherosclerotic disease. Severe coronary artery calcifications. Mediastinum/Nodes: Esophagus and thyroid are unremarkable. Enlarged right hilar lymph nodes, grossly unchanged when compared with prior, lack of IV contrast on prior exam somewhat limits evaluation. Reference right hilar lymph node measuring 14 mm in short axis on series 2, image 60. Lungs/Pleura: Central airways are patent. Mild centrilobular emphysema. Interval increased right upper lobe irregular linear consolidations. Right upper lobe nodule no longer discretely visualized. Stable irregular solid nodule of the right middle lobe measuring 5 mm on series 7, image 78. Elevation of the left hemidiaphragm with adjacent left basilar opacities which are likely due to scarring or atelectasis. No pleural effusion. Upper Abdomen: Partially visualized low-attenuation lesion of the pole the right kidney which is likely simple cysts, no  specific follow-up imaging is necessary. Musculoskeletal: No chest wall abnormality. No acute or significant osseous findings. IMPRESSION: 1. Slightly increased right upper lobe irregular linear consolidations, likely evolving postradiation change. Recommend attention on follow-up. 2. Stable irregular solid nodule of the right middle lobe measuring 5 mm. 3. Enlarged right hilar lymph nodes, grossly unchanged when compared with prior, lack of IV contrast on prior exam somewhat limits evaluation. 4. Aortic Atherosclerosis (ICD10-I70.0) and Emphysema (ICD10-J43.9). Electronically Signed   By: Allegra Lai M.D.   On: 10/07/2023 13:01    Impression: Adenocarcinoma - RUL pulmonary nodule, clinical stage I   The patient is recovering from the effects of radiation.  ***  Plan:  ***   *** minutes of total time was spent for this patient encounter, including preparation, face-to-face counseling with the patient and coordination of care, physical exam, and documentation of the encounter. ____________________________________  Billie Lade, PhD, MD  This document serves as a record of services personally performed by Antony Blackbird, MD. It was created on his  behalf by Neena Rhymes, a trained medical scribe. The creation of this record is based on the scribe's personal observations and the provider's statements to them. This document has been checked and approved by the attending provider.

## 2023-10-09 ENCOUNTER — Encounter: Payer: Self-pay | Admitting: Radiation Oncology

## 2023-10-09 ENCOUNTER — Ambulatory Visit
Admission: RE | Admit: 2023-10-09 | Discharge: 2023-10-09 | Disposition: A | Discharge status: Home / Self Care | Payer: Medicare HMO | Source: Ambulatory Visit | Attending: Radiation Oncology | Admitting: Radiation Oncology

## 2023-10-09 VITALS — BP 120/66 | HR 64 | Temp 97.1°F | Resp 18 | Ht 66.0 in | Wt 167.5 lb

## 2023-10-09 DIAGNOSIS — Z85118 Personal history of other malignant neoplasm of bronchus and lung: Secondary | ICD-10-CM | POA: Diagnosis not present

## 2023-10-09 DIAGNOSIS — Z7952 Long term (current) use of systemic steroids: Secondary | ICD-10-CM | POA: Diagnosis not present

## 2023-10-09 DIAGNOSIS — I700 Atherosclerosis of aorta: Secondary | ICD-10-CM | POA: Diagnosis not present

## 2023-10-09 DIAGNOSIS — C3491 Malignant neoplasm of unspecified part of right bronchus or lung: Secondary | ICD-10-CM

## 2023-10-09 DIAGNOSIS — J432 Centrilobular emphysema: Secondary | ICD-10-CM | POA: Insufficient documentation

## 2023-10-09 DIAGNOSIS — C3411 Malignant neoplasm of upper lobe, right bronchus or lung: Secondary | ICD-10-CM | POA: Diagnosis not present

## 2023-10-09 DIAGNOSIS — Z7982 Long term (current) use of aspirin: Secondary | ICD-10-CM | POA: Diagnosis not present

## 2023-10-09 DIAGNOSIS — Z79899 Other long term (current) drug therapy: Secondary | ICD-10-CM | POA: Insufficient documentation

## 2023-10-09 DIAGNOSIS — Z8616 Personal history of COVID-19: Secondary | ICD-10-CM | POA: Diagnosis not present

## 2023-10-09 DIAGNOSIS — Z87891 Personal history of nicotine dependence: Secondary | ICD-10-CM | POA: Diagnosis not present

## 2023-10-09 DIAGNOSIS — Z923 Personal history of irradiation: Secondary | ICD-10-CM | POA: Diagnosis not present

## 2023-10-09 NOTE — Progress Notes (Addendum)
[name] is here today for follow up post radiation to the lung.  Lung Side: Right  Does the patient complain of any of the following: Pain:No Shortness of breath w/wo exertion: No Cough: Yes Hemoptysis: No Pain with swallowing: She reports difficulty when swallowing. Swallowing/choking concerns: No Appetite: Fair Energy Level: Low Post radiation skin Changes: She reports dark patches on her back and forehead.  BP 120/66 (BP Location: Left Arm, Patient Position: Sitting)   Pulse 64   Temp (!) 97.1 F (36.2 C) (Temporal)   Resp 18   Ht 5\' 6"  (1.676 m)   Wt 167 lb 8 oz (76 kg)   SpO2 100%   BMI 27.04 kg/m      Additional comments if applicable: She wants to know the results of her CT scan.

## 2023-10-11 MED ORDER — FAMOTIDINE 20 MG PO TABS: 20.0000 mg | ORAL_TABLET | Freq: Every day | ORAL | 3 refills | Status: DC

## 2023-10-13 ENCOUNTER — Encounter: Payer: Self-pay | Admitting: Internal Medicine

## 2023-10-20 ENCOUNTER — Ambulatory Visit: Payer: Medicare HMO | Admitting: Internal Medicine

## 2023-10-20 ENCOUNTER — Encounter: Payer: Self-pay | Admitting: Internal Medicine

## 2023-10-20 VITALS — BP 120/76 | HR 77 | Temp 98.6°F | Ht 65.4 in | Wt 167.8 lb

## 2023-10-20 DIAGNOSIS — I700 Atherosclerosis of aorta: Secondary | ICD-10-CM

## 2023-10-20 DIAGNOSIS — I119 Hypertensive heart disease without heart failure: Secondary | ICD-10-CM

## 2023-10-20 DIAGNOSIS — E7800 Pure hypercholesterolemia, unspecified: Secondary | ICD-10-CM

## 2023-10-20 DIAGNOSIS — R151 Fecal smearing: Secondary | ICD-10-CM | POA: Insufficient documentation

## 2023-10-20 DIAGNOSIS — E785 Hyperlipidemia, unspecified: Secondary | ICD-10-CM | POA: Diagnosis not present

## 2023-10-20 DIAGNOSIS — E1165 Type 2 diabetes mellitus with hyperglycemia: Secondary | ICD-10-CM | POA: Diagnosis not present

## 2023-10-20 DIAGNOSIS — E1169 Type 2 diabetes mellitus with other specified complication: Secondary | ICD-10-CM

## 2023-10-20 NOTE — Assessment & Plan Note (Signed)
This has been a chronic issue. I will refer her to Dr. Teressa Lower at [place] as requested.

## 2023-10-20 NOTE — Assessment & Plan Note (Signed)
Chronic, LDL goal is less than 70. She is currently on statin therapy. Will check lipid panel today. She will continue with atorvastatin 80mg  daily M-Friday for now. Previously, she did not tolerate daily dosing.

## 2023-10-20 NOTE — Assessment & Plan Note (Signed)
Chronic, currently on statin therapy. She will continue with atorvastatin 80mg  M-F and ASA 81mg  daily for now.

## 2023-10-20 NOTE — Progress Notes (Signed)
I,Victoria T Deloria Lair, CMA,acting as a Neurosurgeon for Gwynneth Aliment, MD.,have documented all relevant documentation on the behalf of Gwynneth Aliment, MD,as directed by  Gwynneth Aliment, MD while in the presence of Gwynneth Aliment, MD.  Subjective:  Patient ID: [name] , female    DOB: September 11, 1946 , 77 y.o.   MRN: [id number]  Chief Complaint  Patient presents with   Diabetes   Hypertension    HPI  Patient presents today for a diabetes and blood pressure check. Patient reports compliance with her meds.  She denies having any headaches, chest pain and shortness of breath.    Patient would like a referral to Dr.Shadid at [facility] for Bowel leakage.   Diabetes She presents for her follow-up diabetic visit. She has type 2 diabetes mellitus. Her disease course has been stable. Pertinent negatives for hypoglycemia include no dizziness or headaches. Pertinent negatives for diabetes include no blurred vision, no polydipsia, no polyphagia and no polyuria. There are no hypoglycemic complications. Risk factors for coronary artery disease include diabetes mellitus, dyslipidemia, hypertension, obesity, sedentary lifestyle and post-menopausal. She has not had a previous visit with a dietitian. She participates in exercise three times a week. Her breakfast blood glucose is taken between 8-9 am. Her breakfast blood glucose range is generally 110-130 mg/dl. Eye exam is current.  Hypertension This is a chronic problem. The current episode started more than 1 year ago. The problem has been gradually improving since onset. The problem is controlled. Pertinent negatives include no blurred vision or headaches. The current treatment provides moderate improvement. Compliance problems include exercise.      Past Medical History:  Diagnosis Date   Arthritis    Asthma    Complication of anesthesia    COPD (chronic obstructive pulmonary disease) (HCC)    Diabetes (HCC)    High cholesterol    History of  radiation therapy    Right Lung- 12/04/21-12/11/21- Dr. Antony Blackbird   Hypertension    Lung cancer [facility])    Pneumonia 09/10/2017   PONV (postoperative nausea and vomiting)    Stroke (HCC) 2007   mini strokes     Family History  Problem Relation Age of Onset   Heart murmur Mother    Heart attack Father    Diabetes Father    Diabetes Brother      Current Outpatient Medications:    albuterol (VENTOLIN HFA) 108 (90 Base) MCG/ACT inhaler, INHALE 2 PUFFS INTO THE LUNGS EVERY 4 HOURS AS NEEDED FOR WHEEZING OR SHORTNESS OF BREATH, Disp: 18 g, Rfl: 12   aspirin 81 MG tablet, Take 81 mg by mouth daily., Disp: , Rfl:    atorvastatin (LIPITOR) 80 MG tablet, TAKE 1 TABLET BY MOUTH DAILY ON MONDAY-FRIDAY AND SKIP WEEKENDS., Disp: 90 tablet, Rfl: 1   Biotin 40981 MCG TBDP, Take 10,000 mcg by mouth daily., Disp: , Rfl:    bisoprolol (ZEBETA) 5 MG tablet, TAKE 1/2 TABLET BY MOUTH EVERY DAY, Disp: 45 tablet, Rfl: 3   Budeson-Glycopyrrol-Formoterol (BREZTRI AEROSPHERE) 160-9-4.8 MCG/ACT AERO, Inhale 2 puffs into the lungs 2 (two) times daily., Disp: 10.7 g, Rfl: 6   CALCIUM-MAGNESIUM-ZINC PO, Take 1 capsule by mouth at bedtime., Disp: , Rfl:    Cholecalciferol (VITAMIN D3) 2000 UNITS TABS, Take 2,000 Units by mouth at bedtime., Disp: , Rfl:    DULoxetine (CYMBALTA) 60 MG capsule, TAKE 1 CAPSULE(60 MG) BY MOUTH DAILY, Disp: 90 capsule, Rfl: 1   famotidine (PEPCID) 20 MG tablet, Take  1 tablet (20 mg total) by mouth daily after supper., Disp: 90 tablet, Rfl: 3   glucose blood (ACCU-CHEK GUIDE) test strip, 1 each by Other route daily. Use as instructed to check blood sugars 1 time per day dx: e11.22, Disp: 50 each, Rfl: 11   Insulin Pen Needle (PEN NEEDLES) 32G X 4 MM MISC, Use as directed E11.69, Disp: 100 each, Rfl: 2   ipratropium-albuterol (DUONEB) 0.5-2.5 (3) MG/3ML SOLN, USE 3 ML VIA NEBULIZER EVERY 6 HOURS AS NEEDED, Disp: 180 mL, Rfl: 1   Magnesium 250 MG TABS, Take 1 tablet by mouth daily., Disp: ,  Rfl:    montelukast (SINGULAIR) 10 MG tablet, TAKE 1 TABLET(10 MG) BY MOUTH AT BEDTIME, Disp: 90 tablet, Rfl: 3   oxyCODONE-acetaminophen (PERCOCET) 10-325 MG tablet, Take 1 tablet by mouth 4 (four) times daily as needed., Disp: , Rfl:    pantoprazole (PROTONIX) 40 MG tablet, TAKE 1 TABLET BY MOUTH 30 MINUTES BEFORE FIRST MEAL OF THE DAY, Disp: 90 tablet, Rfl: 2   pregabalin (LYRICA) 100 MG capsule, Take 100 mg by mouth 3 (three) times daily., Disp: , Rfl:    Semaglutide, 1 MG/DOSE, (OZEMPIC, 1 MG/DOSE,) 4 MG/3ML SOPN, Inject 1 mg into the skin once a week. Friday, Disp: 3 mL, Rfl: 3   Allergies  Allergen Reactions   Other Swelling    HAIR DYE     Review of Systems  Constitutional: Negative.   Eyes: Negative.  Negative for blurred vision.  Respiratory: Negative.    Cardiovascular: Negative.   Gastrointestinal: Negative.   Endocrine: Negative for polydipsia, polyphagia and polyuria.  Musculoskeletal: Negative.   Skin: Negative.   Neurological: Negative.  Negative for dizziness and headaches.  Psychiatric/Behavioral: Negative.       Today's Vitals   10/20/23 1112  BP: 120/76  Pulse: 77  Temp: 98.6 F (37 C)  Weight: 167 lb 12.8 oz (76.1 kg)  Height: 5' 5.4" (1.661 m)  PainSc: 0-No pain   Body mass index is 27.58 kg/m.  Wt Readings from Last 3 Encounters:  10/20/23 167 lb 12.8 oz (76.1 kg)  10/09/23 167 lb 8 oz (76 kg)  10/06/23 167 lb 6.4 oz (75.9 kg)     Objective:  Physical Exam Vitals and nursing note reviewed.  Constitutional:      Appearance: Normal appearance.  HENT:     Head: Normocephalic and atraumatic.     Mouth/Throat:     Pharynx: Oropharyngeal exudate present.  Eyes:     Extraocular Movements: Extraocular movements intact.  Cardiovascular:     Rate and Rhythm: Normal rate and regular rhythm.     Pulses: Normal pulses.     Heart sounds: Normal heart sounds.  Pulmonary:     Effort: Pulmonary effort is normal.     Breath sounds: Wheezing present.   Genitourinary:    Comments: deferred Musculoskeletal:     Cervical back: Normal range of motion and neck supple.  Neurological:     General: No focal deficit present.     Mental Status: She is alert and oriented to person, place, and time.  Psychiatric:        Mood and Affect: Mood normal.        Behavior: Behavior normal.         Assessment And Plan:  Dyslipidemia associated with type 2 diabetes mellitus (HCC) Assessment & Plan: Chronic, she will continue with Ozempic 1mg  weekly. LDL goal is less than 70, continue with statin therapy.   Orders: -  Lipid panel -     Hemoglobin A1c -     BMP8+EGFR  Hypertensive heart disease without heart failure Assessment & Plan: Chronic, well controlled. She will continue with bisoprolol 5mg  daily. She is encouraged to follow low sodium diet.    Atherosclerosis of aorta [facility]) Assessment & Plan: Chronic, currently on statin therapy. She will continue with atorvastatin 80mg  M-F and ASA 81mg  daily for now.  Orders: -     Lipid panel  Pure hypercholesterolemia Assessment & Plan: Chronic, LDL goal is less than 70. She is currently on statin therapy. Will check lipid panel today. She will continue with atorvastatin 80mg  daily M-Friday for now. Previously, she did not tolerate daily dosing.   Orders: -     Lipid panel  Fecal smearing Assessment & Plan: This has been a chronic issue. I will refer her to Dr. Teressa Lower at [place] as requested.   Orders: -     Ambulatory referral to Gastroenterology     Return if symptoms worsen or fail to improve.  Patient was given opportunity to ask questions. Patient verbalized understanding of the plan and was able to repeat key elements of the plan. All questions were answered to their satisfaction.    I, Gwynneth Aliment, MD, have reviewed all documentation for this visit. The documentation on 10/20/23 for the exam, diagnosis, procedures, and orders are all accurate and complete.   IF YOU  HAVE BEEN REFERRED TO A SPECIALIST, IT MAY TAKE 1-2 WEEKS TO SCHEDULE/PROCESS THE REFERRAL. IF YOU HAVE NOT HEARD FROM US/SPECIALIST IN TWO WEEKS, PLEASE GIVE [place] A CALL AT [phone].   THE PATIENT IS ENCOURAGED TO PRACTICE SOCIAL DISTANCING DUE TO THE COVID-19 PANDEMIC.

## 2023-10-20 NOTE — Patient Instructions (Addendum)
[place]  Type 2 Diabetes Mellitus, Diagnosis, Adult Type 2 diabetes (type 2 diabetes mellitus) is a long-term (chronic) disease. It may happen when there is one or both of these problems: The pancreas does not make enough insulin. The body does not react in a normal way to insulin that it makes. Insulin lets sugars go into cells in your body. If you have type 2 diabetes, sugars cannot get into your cells. Sugars build up in the blood. This causes high blood sugar. What are the causes? The exact cause of this condition is not known. What increases the risk? Having type 2 diabetes in your family. Being overweight or very overweight. Not being active. Your body not reacting in a normal way to the insulin it makes. Having higher than normal blood sugar over time. Having a type of diabetes when you were pregnant. Having a condition that causes small fluid-filled sacs on your ovaries. What are the signs or symptoms? At first, you may have no symptoms. You will get symptoms slowly. They may include: More thirst than normal. More hunger than normal. Needing to pee more than normal. Losing weight without trying. Feeling tired. Feeling weak. Seeing things blurry. Dark patches on your skin. How is this treated? This condition may be treated by a diabetes expert. You may need to: Follow an eating plan made by a food expert (dietitian). Get regular exercise. Find ways to deal with stress. Check blood sugar as often as told. Take medicines. Your doctor will set treatment goals for you. Your blood sugar should be at these levels: Before meals: 80-130 mg/dL (7.8-2.9 mmol/L). After meals: below 180 mg/dL (10 mmol/L). Over the last 2-3 months: less than 7%. Follow these instructions at home: Medicines Take your diabetes medicines or insulin every day. Take medicines as told to help you prevent other problems caused by this condition. You may need: Aspirin. Medicine to lower  cholesterol. Medicine to control blood pressure. Questions to ask your doctor Should I meet with a diabetes educator? What medicines do I need, and when should I take them? What will I need to treat my condition at home? When should I check my blood sugar? Where can I find a support group? Who can I call if I have questions? When is my next doctor visit? General instructions Take over-the-counter and prescription medicines only as told by your doctor. Keep all follow-up visits. Where to find more information For help and guidance and more information about diabetes, please go to: American Diabetes Association (ADA): www.diabetes.org American Association of Diabetes Care and Education Specialists (ADCES): www.diabeteseducator.org International Diabetes Federation (IDF): DCOnly.dk Contact a doctor if: Your blood sugar is at or above 240 mg/dL (56.2 mmol/L) for 2 days in a row. You have been sick for 2 days or more, and you are not getting better. You have had a fever for 2 days or more, and you are not getting better. You have any of these problems for more than 6 hours: You cannot eat or drink. You feel like you may vomit. You vomit. You have watery poop (diarrhea). Get help right away if: Your blood sugar is lower than 54 mg/dL (3 mmol/L). You feel mixed up (confused). You have trouble thinking clearly. You have trouble breathing. You have medium or large ketone levels in your pee. These symptoms may be an emergency. Get help right away. Call your local emergency services (911 in the [place] Do not wait to see if the symptoms will go away. Do  not drive yourself to the hospital. Summary Type 2 diabetes is a long-term disease. Your pancreas may not make enough insulin, or your body may not react in a normal way to insulin that it makes. This condition is treated with an eating plan, lifestyle changes, and medicines. Your doctor will set treatment goals for you. These will help  you keep your blood sugar in a healthy range. Keep all follow-up visits. This information is not intended to replace advice given to you by your health care provider. Make sure you discuss any questions you have with your health care provider. Document Revised: 03/12/2021 Document Reviewed: 03/12/2021 Elsevier Patient Education  2024 [employer].

## 2023-10-20 NOTE — Assessment & Plan Note (Signed)
Chronic, well controlled. She will continue with bisoprolol 5mg  daily. She is encouraged to follow low sodium diet.

## 2023-10-20 NOTE — Assessment & Plan Note (Signed)
Chronic, she will continue with Ozempic 1mg  weekly. LDL goal is less than 70, continue with statin therapy.

## 2023-10-21 LAB — HEMOGLOBIN A1C
Est. average glucose Bld gHb Est-mCnc: 131 mg/dL
Hgb A1c MFr Bld: 6.2 % — ABNORMAL HIGH (ref 4.8–5.6)

## 2023-10-21 LAB — BMP8+EGFR
BUN/Creatinine Ratio: 11 — ABNORMAL LOW (ref 12–28)
BUN: 10 mg/dL (ref 8–27)
CO2: 29 mmol/L (ref 20–29)
Calcium: 9.6 mg/dL (ref 8.7–10.3)
Chloride: 99 mmol/L (ref 96–106)
Creatinine, Ser: 0.92 mg/dL (ref 0.57–1.00)
Glucose: 77 mg/dL (ref 70–99)
Potassium: 5.1 mmol/L (ref 3.5–5.2)
Sodium: 140 mmol/L (ref 134–144)
eGFR: 65 mL/min/{1.73_m2} (ref >59)

## 2023-10-21 LAB — LIPID PANEL
Chol/HDL Ratio: 2.9 ratio (ref 0.0–4.4)
Cholesterol, Total: 189 mg/dL (ref 100–199)
HDL: 65 mg/dL (ref >39)
LDL Chol Calc (NIH): 99 mg/dL (ref 0–99)
Triglycerides: 144 mg/dL (ref 0–149)
VLDL Cholesterol Cal: 25 mg/dL (ref 5–40)

## 2023-10-29 ENCOUNTER — Other Ambulatory Visit: Payer: Self-pay | Admitting: Internal Medicine

## 2023-10-29 ENCOUNTER — Encounter: Payer: Self-pay | Admitting: Pulmonary Disease

## 2023-10-29 NOTE — Telephone Encounter (Signed)
[name], Dr Wynona Neat is out of the office Thursday. Can you advise on this patient? The last prescriptions he sent in for her were on 10/7.

## 2023-10-30 NOTE — Telephone Encounter (Signed)
If no improvement with abx/steroids, recommend she see if PCP can get her in or go to UC. Especially with the worsening headaches. Thanks.

## 2023-11-01 NOTE — Telephone Encounter (Signed)
Dr. Sherene Sires, please see mychart messages sent by pt and others and advise.

## 2023-12-03 ENCOUNTER — Other Ambulatory Visit: Payer: Self-pay | Admitting: Internal Medicine

## 2023-12-03 DIAGNOSIS — R159 Full incontinence of feces: Secondary | ICD-10-CM | POA: Diagnosis not present

## 2023-12-04 ENCOUNTER — Encounter: Payer: Self-pay | Admitting: Pulmonary Disease

## 2023-12-04 NOTE — Telephone Encounter (Signed)
Patient is having some troubles. This is her previous mychart note from today:   I was trying to send a message to Dr. Sherene Sires because he knows my history, but he's not on the listed contacts for some reason. Anyway, this is 3 rd day of extreme coughing, runny nose and labored breathing. I've had to use my Nebulizer 2 times so far and haven't had to use it in almost 2 years. It feels like there are heavy weights on my shoulders which is a new feeling. This happens when I try to walk from one room to the other. Nebulizer helps a lot with this. I have zero energy. Everything aches. Not coughing up phlegm. Wheezing is heavy and feels like rumbling in throat and chest. Been taking Mucinex but it's not helping. Nothing is stopping cough. In the past, Percocet stopped cough but of course I don't have any. Coughing hurts every part of my body. Sneezing hurts most in my back. What do I do now? Thank you.    Call back number [phone]

## 2023-12-05 ENCOUNTER — Encounter: Payer: Self-pay | Admitting: Pulmonary Disease

## 2023-12-05 MED ORDER — OXYCODONE-ACETAMINOPHEN 10-325 MG PO TABS: 1.0000 | ORAL_TABLET | ORAL | 0 refills | Status: DC | PRN

## 2023-12-05 NOTE — Telephone Encounter (Signed)
Patient has been notified

## 2023-12-05 NOTE — Telephone Encounter (Signed)
Patient checking on message for cough. Patient phone number is [phone].

## 2023-12-05 NOTE — Telephone Encounter (Signed)
I can give enough percocet to get thru the weekend but strongly prefer she see a provider and I am   in [place] for the next week.   If condition worsens in meantime > to ER

## 2023-12-05 NOTE — Telephone Encounter (Signed)
Patient would like prednisone if that will help with the cough. She has been taking musenix but that has not been helping with her cough. Call back number [phone]

## 2023-12-08 ENCOUNTER — Encounter: Payer: Self-pay | Admitting: Internal Medicine

## 2023-12-09 ENCOUNTER — Ambulatory Visit: Payer: Medicare HMO | Admitting: Pulmonary Disease

## 2023-12-09 ENCOUNTER — Encounter: Payer: Self-pay | Admitting: Pulmonary Disease

## 2023-12-09 VITALS — BP 140/68 | HR 105 | Temp 98.0°F | Ht 65.4 in | Wt 170.0 lb

## 2023-12-09 DIAGNOSIS — J441 Chronic obstructive pulmonary disease with (acute) exacerbation: Secondary | ICD-10-CM

## 2023-12-09 MED ORDER — OXYCODONE-ACETAMINOPHEN 10-325 MG PO TABS: 1.0000 | ORAL_TABLET | ORAL | 0 refills | Status: DC | PRN

## 2023-12-09 MED ORDER — ZITHROMAX Z-PAK 250 MG PO TABS
ORAL_TABLET | ORAL | 0 refills | Status: DC
Start: 2023-12-09 — End: 2024-01-05

## 2023-12-09 MED ORDER — PREDNISONE 10 MG PO TABS
60.0000 mg | ORAL_TABLET | Freq: Every day | ORAL | 0 refills | Status: DC
Start: 2023-12-09 — End: 2024-02-18

## 2023-12-09 MED ORDER — METHYLPREDNISOLONE ACETATE 80 MG/ML IJ SUSP
120.0000 mg | Freq: Once | INTRAMUSCULAR | Status: AC
Start: 2023-12-09 — End: 2023-12-09
  Administered 2023-12-09: 120 mg via INTRAMUSCULAR

## 2023-12-09 NOTE — Progress Notes (Signed)
[name]    [id number]    [birth year]  Primary Care Physician:[name], Melina Schools, MD  Referring Physician: Dorothyann Peng, MD [address] [place],  [place] [address]  Chief complaint: Acute visit for cough  HPI: 77 y.o. who  has a past medical history of Arthritis, Asthma, Complication of anesthesia, COPD (chronic obstructive pulmonary disease) (HCC), Diabetes (HCC), High cholesterol, History of radiation therapy, Hypertension, Lung cancer (HCC), Pneumonia (09/10/2017), PONV (postoperative nausea and vomiting), and Stroke (HCC) (2007).   Discussed the use of AI scribe software for clinical note transcription with the patient, who gave verbal consent to proceed.  [name], a patient with a history of COPD and lung cancer, presents with a worsening cough.  She is an established patient of Dr. Sherene Sires.  She describes having a coughing spell every year that may go away for six to seven months and then return. However, this time, the cough is different. It is so severe that it induces nausea and a feeling of needing to vomit. The coughing episodes are also causing breathing problems. The cough is productive, with thick, clear sputum. She denies fever but reports feeling hot and cold intermittently.  [name] was diagnosed with adenocarcinoma of the right lung in 2023 and underwent radiation therapy. However, there is still something on her lung that was initially thought to be scar tissue but is now being monitored for potential lymph node involvement.  She is currently on Breztri inhaler, montelukast, and albuterol as needed. She has also been using a nebulizer at home, which she hasn't needed for almost two years until now. She has been taking prednisone for the past four days without any improvement. She also reports that oxycodone has helped her cough in the past, but it is not currently prescribed.   Outpatient Encounter Medications as of 12/09/2023  Medication Sig    albuterol (VENTOLIN HFA) 108 (90 Base) MCG/ACT inhaler INHALE 2 PUFFS INTO THE LUNGS EVERY 4 HOURS AS NEEDED FOR WHEEZING OR SHORTNESS OF BREATH   aspirin 81 MG tablet Take 81 mg by mouth daily.   atorvastatin (LIPITOR) 80 MG tablet TAKE 1 TABLET BY MOUTH DAILY ON MONDAY-FRIDAY AND SKIP WEEKENDS.   Biotin 62130 MCG TBDP Take 10,000 mcg by mouth daily.   bisoprolol (ZEBETA) 5 MG tablet TAKE 1/2 TABLET BY MOUTH EVERY DAY   Budeson-Glycopyrrol-Formoterol (BREZTRI AEROSPHERE) 160-9-4.8 MCG/ACT AERO Inhale 2 puffs into the lungs 2 (two) times daily.   CALCIUM-MAGNESIUM-ZINC PO Take 1 capsule by mouth at bedtime.   Cholecalciferol (VITAMIN D3) 2000 UNITS TABS Take 2,000 Units by mouth at bedtime.   DULoxetine (CYMBALTA) 60 MG capsule TAKE 1 CAPSULE(60 MG) BY MOUTH DAILY   famotidine (PEPCID) 20 MG tablet Take 1 tablet (20 mg total) by mouth daily after supper.   glucose blood (ACCU-CHEK GUIDE) test strip 1 each by Other route daily. Use as instructed to check blood sugars 1 time per day dx: e11.22   Insulin Pen Needle (PEN NEEDLES) 32G X 4 MM MISC Use as directed E11.69   ipratropium-albuterol (DUONEB) 0.5-2.5 (3) MG/3ML SOLN USE 3 ML VIA NEBULIZER EVERY 6 HOURS AS NEEDED   Magnesium 250 MG TABS Take 1 tablet by mouth daily.   montelukast (SINGULAIR) 10 MG tablet TAKE 1 TABLET(10 MG) BY MOUTH AT BEDTIME   pantoprazole (PROTONIX) 40 MG tablet TAKE 1 TABLET BY MOUTH 30 MINUTES BEFORE FIRST MEAL OF THE DAY   predniSONE (DELTASONE) 10 MG tablet Take  6 tablets (60 mg total) by mouth daily with breakfast. Take 60 mg/day on day 1 and reduce dose by 10 mg every 2 days until taper is complete   pregabalin (LYRICA) 100 MG capsule Take 100 mg by mouth 3 (three) times daily.   Semaglutide, 1 MG/DOSE, (OZEMPIC, 1 MG/DOSE,) 4 MG/3ML SOPN Inject 1 mg into the skin once a week. Friday   ZITHROMAX Z-PAK 250 MG tablet Take 500 mg on day 1, followed by 250 mg once daily on days 2 to 5   [DISCONTINUED]  oxyCODONE-acetaminophen (PERCOCET) 10-325 MG tablet Take 1 tablet by mouth every 4 (four) hours as needed.   oxyCODONE-acetaminophen (PERCOCET) 10-325 MG tablet Take 1 tablet by mouth every 4 (four) hours as needed.   No facility-administered encounter medications on file as of 12/09/2023.   Physical Exam: Blood pressure (!) 144/66, pulse (!) 105, temperature 98 F (36.7 C), temperature source Oral, height 5' 5.4" (1.661 m), weight 170 lb (77.1 kg), SpO2 100%. Gen:      No acute distress HEENT:  EOMI, sclera anicteric Neck:     No masses; no thyromegaly Lungs:    Bilateral expiratory wheeze CV:         Regular rate and rhythm; no murmurs Abd:      + bowel sounds; soft, non-tender; no palpable masses, no distension Ext:    No edema; adequate peripheral perfusion Skin:      Warm and dry; no rash Neuro: alert and oriented x 3 Psych: normal mood and affect  Assessment and Plan COPD Exacerbation Increased cough, wheezing, and sputum production. No fever. Currently on Breztri inhaler, montelukast, and albuterol PRN. Recently started prednisone 10mg  with no improvement.  -Start nebulizer treatments at home. -Initiate Z-Pak. -Give Depo injection 120 mg in office.  Increase prednisone to 60mg  with a taper. -Check back if symptoms do not improve.  Lung Cancer Adenocarcinoma of the right lung diagnosed in 2023, treated with radiation. Currently under surveillance with concern for possible lymph node involvement. -Continue current surveillance plan.  Gastroesophageal Reflux Disease Currently on pantoprazole once daily. -Increase pantoprazole to twice daily.  Pain Management Reports oxycodone helps with cough. -Provide a short course of oxycodone (6 tablets) with no more refills.  Chilton Greathouse MD  Pulmonary and Critical Care 12/09/2023, 10:13 AM  CC: Dorothyann Peng, MD

## 2023-12-09 NOTE — Patient Instructions (Signed)
VISIT SUMMARY:  During today's visit, we discussed your worsening cough, which has become severe enough to cause nausea and breathing difficulties. We also reviewed your history of COPD and lung cancer, as well as your current medications and treatments.  YOUR PLAN:  -COPD EXACERBATION: COPD exacerbation means a worsening of your chronic obstructive pulmonary disease symptoms, such as increased cough, wheezing, and sputum production. We will start nebulizer treatments at home, initiate a Z-Pak (antibiotic), and increase your prednisone dose to 60mg  with a taper. Please check back if your symptoms do not improve.  -LUNG CANCER: You have adenocarcinoma of the right lung, diagnosed in 2023 and treated with radiation. We will continue with the current surveillance plan to monitor for any changes or lymph node involvement.  -GASTROESOPHAGEAL REFLUX DISEASE: Gastroesophageal reflux disease (GERD) is a condition where stomach acid frequently flows back into the tube connecting your mouth and stomach. We will increase your pantoprazole to twice daily to help manage this condition.  -PAIN MANAGEMENT: You reported that oxycodone helps with your cough. We will provide a short course of oxycodone (6 tablets) to help manage your symptoms.  INSTRUCTIONS:  Please follow the new medication plan and start nebulizer treatments at home. If your symptoms do not improve, make sure to check back with [place]. Continue with the current surveillance plan for your lung cancer and increase your pantoprazole to twice daily for GERD management.

## 2023-12-09 NOTE — Addendum Note (Signed)
Addended by: Christen Butter on: 12/09/2023 11:07 AM   Modules accepted: Orders

## 2023-12-11 ENCOUNTER — Other Ambulatory Visit: Payer: Self-pay

## 2023-12-11 MED ORDER — BREZTRI AEROSPHERE 160-9-4.8 MCG/ACT IN AERO: 2.0000 | INHALATION_SPRAY | Freq: Two times a day (BID) | RESPIRATORY_TRACT | 6 refills | Status: DC

## 2023-12-18 ENCOUNTER — Ambulatory Visit: Payer: Medicare HMO | Attending: Internal Medicine | Admitting: Internal Medicine

## 2023-12-18 VITALS — BP 130/62 | HR 88 | Ht 65.0 in | Wt 170.0 lb

## 2023-12-18 DIAGNOSIS — I10 Essential (primary) hypertension: Secondary | ICD-10-CM | POA: Diagnosis not present

## 2023-12-18 DIAGNOSIS — I2510 Atherosclerotic heart disease of native coronary artery without angina pectoris: Secondary | ICD-10-CM | POA: Diagnosis not present

## 2023-12-18 DIAGNOSIS — E785 Hyperlipidemia, unspecified: Secondary | ICD-10-CM | POA: Diagnosis not present

## 2023-12-18 DIAGNOSIS — R0602 Shortness of breath: Secondary | ICD-10-CM

## 2023-12-18 DIAGNOSIS — Z8673 Personal history of transient ischemic attack (TIA), and cerebral infarction without residual deficits: Secondary | ICD-10-CM | POA: Diagnosis not present

## 2023-12-18 DIAGNOSIS — E1169 Type 2 diabetes mellitus with other specified complication: Secondary | ICD-10-CM

## 2023-12-18 MED ORDER — METOPROLOL TARTRATE 100 MG PO TABS: 100.0000 mg | ORAL_TABLET | ORAL | 0 refills | Status: DC

## 2023-12-18 NOTE — Progress Notes (Signed)
OFFICE CONSULT NOTE  Chief Complaint:  Dyslipidemia  Primary Care Physician: Dorothyann Peng, MD  HPI:  [name] is a 77 y.o. female who is being seen today for the evaluation of dyslipidemia at the request of Dorothyann Peng, MD. this a pleasant 77 year old female kindly referred for evaluation management of dyslipidemia.  She has a history of type 2 diabetes, COPD, tobacco abuse, hypertension and lung cancer with radiation therapy.  She also has a history of TIA.  Her target LDL should be much lower than recent labs, likely less than 55 as she would be considered very high risk.  Recent labs showed her total cholesterol 189, triglycerides 144, HDL 65 and LDL 99.  This is on atorvastatin 80 mg daily.  Generally this is representative of her cholesterol profile is over the past several years.  She has been on Ozempic with some work on weight loss.  She takes a low-dose aspirin as well.  Of note I pointed out that she has had multiple chest CT scans for follow-up of her lung cancer which is demonstrated aortic atherosclerosis and more recently "severe coronary artery calcifications".  She reports that she has never been told that she has any coronary artery disease.  She denies any chest pain but does note some shortness of breath and fatigue.  She cannot tell whether this is related to her lung cancer or radiation therapy.  She reports a chronic nonproductive cough which she has had for years.  She was seen by Dr. Elease Hashimoto and 2022 and underwent a Myoview stress test which was negative for ischemia.  She had complained of chest pain at that time.  She also notes her father had an MI and died at age 26.  PMHx:  Past Medical History:  Diagnosis Date   Arthritis    Asthma    Complication of anesthesia    COPD (chronic obstructive pulmonary disease) (HCC)    Diabetes (HCC)    High cholesterol    History of radiation therapy    Right Lung- 12/04/21-12/11/21- Dr. Antony Blackbird   Hypertension     Lung cancer (HCC)    Pneumonia 09/10/2017   PONV (postoperative nausea and vomiting)    Stroke [facility]) 2007   mini strokes    Past Surgical History:  Procedure Laterality Date   ABDOMINAL HYSTERECTOMY  1995   BRONCHIAL BIOPSY  10/23/2021   Procedure: BRONCHIAL BIOPSIES;  Surgeon: Josephine Igo, DO;  Location: [facility];  Service: Pulmonary;;   BRONCHIAL BRUSHINGS  10/23/2021   Procedure: BRONCHIAL BRUSHINGS;  Surgeon: Josephine Igo, DO;  Location: [facility];  Service: Pulmonary;;   BRONCHIAL NEEDLE ASPIRATION BIOPSY  10/23/2021   Procedure: BRONCHIAL NEEDLE ASPIRATION BIOPSIES;  Surgeon: Josephine Igo, DO;  Location: [facility];  Service: Pulmonary;;   carpel tunnel surgery Right 2006   COLONOSCOPY  2014   COLOSTOMY  2007   colostomy let down  2008   endoscopy  2014   FIDUCIAL MARKER PLACEMENT  10/23/2021   Procedure: FIDUCIAL MARKER PLACEMENT;  Surgeon: Josephine Igo, DO;  Location: [facility];  Service: Pulmonary;;   rotator cuff tear Left 04/14/2020   VIDEO BRONCHOSCOPY WITH ENDOBRONCHIAL NAVIGATION Right 10/23/2021   Procedure: VIDEO BRONCHOSCOPY WITH ENDOBRONCHIAL NAVIGATION;  Surgeon: Josephine Igo, DO;  Location: [facility];  Service: Pulmonary;  Laterality: Right;  ION, w/ fiducial placement   VIDEO BRONCHOSCOPY WITH RADIAL ENDOBRONCHIAL ULTRASOUND  10/23/2021   Procedure: VIDEO BRONCHOSCOPY WITH RADIAL ENDOBRONCHIAL ULTRASOUND;  Surgeon: Josephine Igo, DO;  Location: [facility];  Service: Pulmonary;;    FAMHx:  Family History  Problem Relation Age of Onset   Heart murmur Mother    Heart attack Father    Diabetes Father    Diabetes Brother     SOCHx:   reports that she quit smoking about 17 years ago. Her smoking use included cigarettes. She started smoking about 56 years ago. She has a 21 pack-year smoking history. She has never used smokeless tobacco. She reports that she does not currently use alcohol. She reports that she does not  use drugs.  ALLERGIES:  Allergies  Allergen Reactions   Other Swelling    HAIR DYE    ROS: Pertinent items noted in HPI and remainder of comprehensive ROS otherwise negative.  HOME MEDS: Current Outpatient Medications on File Prior to Visit  Medication Sig Dispense Refill   albuterol (VENTOLIN HFA) 108 (90 Base) MCG/ACT inhaler INHALE 2 PUFFS INTO THE LUNGS EVERY 4 HOURS AS NEEDED FOR WHEEZING OR SHORTNESS OF BREATH 18 g 12   aspirin 81 MG tablet Take 81 mg by mouth daily.     atorvastatin (LIPITOR) 80 MG tablet TAKE 1 TABLET BY MOUTH DAILY ON MONDAY-FRIDAY AND SKIP WEEKENDS. 90 tablet 1   Biotin 16109 MCG TBDP Take 10,000 mcg by mouth daily.     bisoprolol (ZEBETA) 5 MG tablet TAKE 1/2 TABLET BY MOUTH EVERY DAY 45 tablet 3   Budeson-Glycopyrrol-Formoterol (BREZTRI AEROSPHERE) 160-9-4.8 MCG/ACT AERO Inhale 2 puffs into the lungs 2 (two) times daily. 10.7 g 6   CALCIUM-MAGNESIUM-ZINC PO Take 1 capsule by mouth at bedtime.     Cholecalciferol (VITAMIN D3) 2000 UNITS TABS Take 2,000 Units by mouth at bedtime.     DULoxetine (CYMBALTA) 60 MG capsule TAKE 1 CAPSULE(60 MG) BY MOUTH DAILY 90 capsule 1   famotidine (PEPCID) 20 MG tablet Take 1 tablet (20 mg total) by mouth daily after supper. 90 tablet 3   glucose blood (ACCU-CHEK GUIDE) test strip 1 each by Other route daily. Use as instructed to check blood sugars 1 time per day dx: e11.22 50 each 11   Insulin Pen Needle (PEN NEEDLES) 32G X 4 MM MISC Use as directed E11.69 100 each 2   ipratropium-albuterol (DUONEB) 0.5-2.5 (3) MG/3ML SOLN USE 3 ML VIA NEBULIZER EVERY 6 HOURS AS NEEDED 180 mL 1   Magnesium 250 MG TABS Take 1 tablet by mouth daily.     montelukast (SINGULAIR) 10 MG tablet TAKE 1 TABLET(10 MG) BY MOUTH AT BEDTIME 90 tablet 3   oxyCODONE-acetaminophen (PERCOCET) 10-325 MG tablet Take 1 tablet by mouth every 4 (four) hours as needed. 6 tablet 0   pantoprazole (PROTONIX) 40 MG tablet TAKE 1 TABLET BY MOUTH 30 MINUTES BEFORE FIRST  MEAL OF THE DAY 90 tablet 2   predniSONE (DELTASONE) 10 MG tablet Take 6 tablets (60 mg total) by mouth daily with breakfast. Take 60 mg/day on day 1 and reduce dose by 10 mg every 2 days until taper is complete 50 tablet 0   pregabalin (LYRICA) 100 MG capsule Take 100 mg by mouth 3 (three) times daily.     Semaglutide, 1 MG/DOSE, (OZEMPIC, 1 MG/DOSE,) 4 MG/3ML SOPN Inject 1 mg into the skin once a week. Friday 3 mL 3   ZITHROMAX Z-PAK 250 MG tablet Take 500 mg on day 1, followed by 250 mg once daily on days 2 to 5 6 tablet 0   No current facility-administered medications  on file prior to visit.    LABS/IMAGING: No results found for this or any previous visit (from the past 48 hours). No results found.  LIPID PANEL:    Component Value Date/Time   CHOL 189 10/20/2023 1219   TRIG 144 10/20/2023 1219   HDL 65 10/20/2023 1219   CHOLHDL 2.9 10/20/2023 1219   LDLCALC 99 10/20/2023 1219    WEIGHTS: Wt Readings from Last 3 Encounters:  12/18/23 170 lb (77.1 kg)  12/09/23 170 lb (77.1 kg)  10/20/23 167 lb 12.8 oz (76.1 kg)    VITALS: BP 130/62 (BP Location: Left Arm, Patient Position: Sitting, Cuff Size: Normal)   Pulse 88   Ht 5\' 5"  (1.651 m)   Wt 170 lb (77.1 kg)   SpO2 95%   BMI 28.29 kg/m   EXAM: General appearance: alert and no distress Neck: no carotid bruit, no JVD, and thyroid not enlarged, symmetric, no tenderness/mass/nodules Lungs: clear to auscultation bilaterally Heart: regular rate and rhythm, S1, S2 normal, no murmur, click, rub or gallop Abdomen: soft, non-tender; bowel sounds normal; no masses,  no organomegaly Extremities: extremities normal, atraumatic, no cyanosis or edema Pulses: 2+ and symmetric Skin: Skin color, texture, turgor normal. No rashes or lesions Neurologic: Grossly normal Psych: Pleasant  EKG: Deferred  ASSESSMENT: Mixed dyslipidemia, very high risk, goal LDL less than 55 Aortic atherosclerosis and severe multivessel coronary artery  calcification History of TIA Type 2 diabetes Hypertension Tobacco use Lung cancer Family history of premature coronary artery disease  PLAN: 1.   Ms. Justman needs better control of her dyslipidemia as well as further evaluation of her coronary disease.  Although she had a negative Myoview 2 years ago she has severe multivessel coronary artery calcifications and has had some worsening shortness of breath and fatigue.  I would advise a coronary CT angiogram.  With regards to her lipids, would like to target her LDL to less than 55.  Will assess and LP(a) in addition to her metabolic profile prior to CT.  If this is elevated I would suggest that adding PCSK9 inhibitor would be the best option to reach her target which is much lower.  Will then plan to follow-up with her with repeat lipid testing in several months.  Thanks again for the kind referral.  Chrystie Nose, MD, [facility]  [place]  [facility]  Medical Director of the Advanced Lipid Disorders &  Cardiovascular Risk Reduction Clinic Diplomate of the American Board of Clinical Lipidology Attending Cardiologist  Direct Dial: [phone]  Fax: [phone]  Website:  [email or web address] Nicely Kainan Patty 12/18/2023, 3:19 PM

## 2023-12-18 NOTE — Patient Instructions (Signed)
Medication Instructions:  No changes  *If you need a refill on your cardiac medications before your next appointment, please call your pharmacy*   Lab Work: BMET Lpa  If you have labs (blood work) drawn today and your tests are completely normal, you will receive your results only by: MyChart Message (if you have MyChart) OR A paper copy in the mail If you have any lab test that is abnormal or we need to change your treatment, we will call you to review the results.   Testing/Procedures:   Your cardiac CT will be scheduled at one of the below locations:   [facility] [address] [place], [place] [address] [phone]  OR  [facility] [address] [place], [place] [address] [phone]  OR   [facility] [address] [place], [place] [address] [phone]  If scheduled at [facility], please arrive at the [facility] and Children's Entrance (Entrance C2) of [facility] 30 minutes prior to test start time. You can use the FREE valet parking offered at entrance C (encouraged to control the heart rate for the test)  Proceed to the [facility] Radiology Department (first floor) to check-in and test prep.  All radiology patients and guests should use entrance C2 at [facility], accessed from [facility], even though the hospital's physical address listed is [address]    If scheduled at [facility] or [facility], please arrive 15 mins early for check-in and test prep.  There is spacious parking and easy access to the radiology department from the [facility] Heart and Vascular entrance. Please enter here and check-in with the desk attendant.   Please follow these instructions carefully (unless otherwise directed):  An IV will be required for this test and Nitroglycerin will be given.  Hold all  erectile dysfunction medications at least 3 days (72 hrs) prior to test. (Ie viagra, cialis, sildenafil, tadalafil, etc)   On the Night Before the Test: Be sure to Drink plenty of water. Do not consume any caffeinated/decaffeinated beverages or chocolate 12 hours prior to your test. Do not take any antihistamines 12 hours prior to your test.   On the Day of the Test: Drink plenty of water until 1 hour prior to the test. Do not eat any food 1 hour prior to test. You may take your regular medications prior to the test.  Take metoprolol (Lopressor) two hours prior to test. If you take Furosemide/Hydrochlorothiazide/Spironolactone/Chlorthalidone, please HOLD on the morning of the test. Patients who wear a continuous glucose monitor MUST remove the device prior to scanning. FEMALES- please wear underwire-free bra if available, avoid dresses & tight clothing      After the Test: Drink plenty of water. After receiving IV contrast, you may experience a mild flushed feeling. This is normal. On occasion, you may experience a mild rash up to 24 hours after the test. This is not dangerous. If this occurs, you can take Benadryl 25 mg and increase your fluid intake. If you experience trouble breathing, this can be serious. If it is severe call 911 IMMEDIATELY. If it is mild, please call our office.  We will call to schedule your test 2-4 weeks out understanding that some insurance companies will need an authorization prior to the service being performed.   For more information and frequently asked questions, please visit our website : [email or web address]  For non-scheduling  related questions, please contact the cardiac imaging nurse navigator should you have any questions/concerns: Cardiac Imaging Nurse Navigators Direct Office Dial: [phone]   For scheduling needs, including cancellations and rescheduling, please call [place], [phone].    Follow-Up: At [facility], you and your health needs are our priority.  As part of our continuing mission to provide you with exceptional heart care, we have created designated Provider Care Teams.  These Care Teams include your primary Cardiologist (physician) and Advanced Practice Providers (APPs -  Physician Assistants and Nurse Practitioners) who all work together to provide you with the care you need, when you need it.  We recommend signing up for the patient portal called "MyChart".  Sign up information is provided on this After Visit Summary.  MyChart is used to connect with patients for Virtual Visits (Telemedicine).  Patients are able to view lab/test results, encounter notes, upcoming appointments, etc.  Non-urgent messages can be sent to your provider as well.   To learn more about what you can do with MyChart, go to [email or web address].    Your next appointment:   4 month(s)  Provider:   Zoila Shutter

## 2023-12-19 LAB — BASIC METABOLIC PANEL
BUN/Creatinine Ratio: 16 (ref 12–28)
BUN: 13 mg/dL (ref 8–27)
CO2: 21 mmol/L (ref 20–29)
Calcium: 9.2 mg/dL (ref 8.7–10.3)
Chloride: 99 mmol/L (ref 96–106)
Creatinine, Ser: 0.8 mg/dL (ref 0.57–1.00)
Glucose: 113 mg/dL — ABNORMAL HIGH (ref 70–99)
Potassium: 5 mmol/L (ref 3.5–5.2)
Sodium: 142 mmol/L (ref 134–144)
eGFR: 76 mL/min/{1.73_m2} (ref >59)

## 2023-12-19 LAB — LIPOPROTEIN A (LPA): Lipoprotein (a): 117.1 nmol/L — ABNORMAL HIGH (ref <75.0)

## 2023-12-21 ENCOUNTER — Encounter: Payer: Self-pay | Admitting: Internal Medicine

## 2023-12-22 ENCOUNTER — Telehealth: Payer: Self-pay | Admitting: Internal Medicine

## 2023-12-22 NOTE — Telephone Encounter (Signed)
Spoke to pt regarding metoprolol tartrate 100 mg prescription. Advised pt to take pill 2 hours before her cardiac CT on 01/08/24. Patient verbalized understanding.

## 2023-12-22 NOTE — Telephone Encounter (Signed)
Pt returned phone call regarding metoprolol tartrate 100 mg prescription. Pt is scheduled for a cardiac CT on 01/08/2024. I instructed pt to take single pill 2 hours before CT. Patient verbalized understanding.   Josie, LPN

## 2023-12-22 NOTE — Telephone Encounter (Signed)
  Pt is returning call regarding her mychart message

## 2023-12-25 ENCOUNTER — Encounter: Payer: Self-pay | Admitting: Internal Medicine

## 2023-12-25 NOTE — Telephone Encounter (Signed)
Dr Sherene Sires- Please advise on pt email  [name]  P [facility] Pool (supporting [name], MD)5 hours ago (9:56 AM)    As you know, every so often I have a season of coughing. It comes and then ends for months. This season has not ended yet. I'm still coughing even with everything that's been prescribed. I'm spitting up thick phlegm. It is a very nasty sounding cough and is very embarrassing. I have been able to take Percocet an hour before going to public places and it has kept the cough at bay ( thank God). I was prescribed a few pills but they are gone. No one likes to prescribe this for me, but it serves a great service for me. I only ask for it during the times when nothing else takes the cough away..nothing. If I need to be seen again before my visit with Dr. Sherene Sires in February in order to receive a few more pills, I'll schedule an appointment.  I have quite a few events I need to attend. I am asking again for at least a few tablets to get through this season. And as my history will show, after this I will not ask again for Percocet for months or even a year if and when the cough comes back. I know that you know that I am not a junkie trying to get high or whatever and that this need is totally legitimate. PERCOCET REALLY TAKES THE COUGH AWAY. Thank you for your consideration. Happy Holidays.     No questionnaires available.

## 2023-12-29 ENCOUNTER — Telehealth: Payer: Self-pay | Admitting: Pharmacy Technician

## 2023-12-29 ENCOUNTER — Other Ambulatory Visit (HOSPITAL_COMMUNITY): Payer: Self-pay

## 2023-12-29 DIAGNOSIS — E785 Hyperlipidemia, unspecified: Secondary | ICD-10-CM

## 2023-12-29 MED ORDER — REPATHA SURECLICK 140 MG/ML ~~LOC~~ SOAJ: 140.0000 mg | SUBCUTANEOUS | 3 refills | Status: DC

## 2023-12-29 NOTE — Telephone Encounter (Signed)
Pharmacy Patient Advocate Encounter  Received notification from [facility] that Prior Authorization for repatha has been APPROVED from 12/30/22 to 12/29/24. Ran test claim, Copay is $45.00 one month. This test claim was processed through [facility]- copay amounts may vary at other pharmacies due to pharmacy/plan contracts, or as the patient moves through the different stages of their insurance plan.   PA #/Case ID/Reference #: [id number]

## 2023-12-29 NOTE — Telephone Encounter (Signed)
Pharmacy Patient Advocate Encounter   Received notification from Physician's Office that prior authorization for repatha is required/requested.   Insurance verification completed.   The patient is insured through [place] .   Per test claim: PA required; PA submitted to above mentioned insurance via CoverMyMeds Key/confirmation #/EOC [id number] Status is pending

## 2023-12-29 NOTE — Telephone Encounter (Signed)
-----   Message from Nurse Eileen Stanford E sent at 12/29/2023  1:07 PM EST ----- Regarding: PA for Repatha needed Hey team   This patient needs a PA for Repatha Goal LDL less than 55 Aortic atherosclerosis, severe coronary artery calcifications on CT History of TIA  Thanks! Eileen Stanford RN

## 2023-12-30 ENCOUNTER — Encounter: Payer: Self-pay | Admitting: Internal Medicine

## 2024-01-05 ENCOUNTER — Encounter (HOSPITAL_COMMUNITY): Payer: Self-pay

## 2024-01-05 NOTE — Telephone Encounter (Signed)
 Per chart patient saw Mannam 12/09/23 for ACUTE visit and was given Zpak, Pred and Percocet. She was to follow up in office if not improving.   Please advise if would still like to prescribe second round of Zpak and pred. Thank you!

## 2024-01-07 NOTE — Telephone Encounter (Signed)
 Sheena responded to [facility] messages 1/6 for pt to call and schedule a appt. Nfn

## 2024-01-08 ENCOUNTER — Other Ambulatory Visit: Payer: Self-pay | Admitting: Internal Medicine

## 2024-01-08 ENCOUNTER — Ambulatory Visit (HOSPITAL_COMMUNITY)
Admission: RE | Admit: 2024-01-08 | Discharge: 2024-01-08 | Disposition: A | Discharge status: Home / Self Care | Payer: Medicare HMO | Source: Ambulatory Visit | Attending: Internal Medicine | Admitting: Internal Medicine

## 2024-01-08 DIAGNOSIS — I2510 Atherosclerotic heart disease of native coronary artery without angina pectoris: Secondary | ICD-10-CM | POA: Insufficient documentation

## 2024-01-08 DIAGNOSIS — R0602 Shortness of breath: Secondary | ICD-10-CM | POA: Insufficient documentation

## 2024-01-08 MED ORDER — NITROGLYCERIN 0.4 MG SL SUBL
SUBLINGUAL_TABLET | SUBLINGUAL | Status: AC
Start: 2024-01-08 — End: ?
  Filled 2024-01-08: qty 1

## 2024-01-08 MED ORDER — DILTIAZEM HCL 25 MG/5ML IV SOLN
5.0000 mg | Freq: Once | INTRAVENOUS | Status: AC
  Administered 2024-01-08: 5 mg via INTRAVENOUS

## 2024-01-08 MED ORDER — DILTIAZEM HCL 25 MG/5ML IV SOLN
INTRAVENOUS | Status: AC
Start: 2024-01-08 — End: ?
  Filled 2024-01-08: qty 5

## 2024-01-08 MED ORDER — IOHEXOL 350 MG/ML SOLN
100.0000 mL | Freq: Once | INTRAVENOUS | Status: AC | PRN
  Administered 2024-01-08: 100 mL via INTRAVENOUS

## 2024-01-08 MED ORDER — NITROGLYCERIN 0.4 MG SL SUBL
0.8000 mg | SUBLINGUAL_TABLET | Freq: Once | SUBLINGUAL | Status: AC
  Administered 2024-01-08: 0.8 mg via SUBLINGUAL

## 2024-01-20 ENCOUNTER — Encounter: Payer: Self-pay | Admitting: *Deleted

## 2024-01-20 NOTE — Telephone Encounter (Signed)
Dr Sherene Sires- please advise on pt email   I have seen 2 doctors at your practice for this cough who prescribed the same meds, prednisone and antibiotics, with the exception of one doc who increased my dose of Pantoprazole. But still coughing. Im still requesting Percocet for my cough. Doctors everywhere prescribe this for patients with pain with no condemnation because it works. This same med also works in taking away my cough. There has to be some scientific evidence that supports that it also takes away the cough because it does for me every time. If the nurse practitioner is able and/or allowed to prescribe Percocet, I will make an appointment right away. If he or she is going to repeat what the other doctors prescribed, then it is a waste of time and money and I will just deal and suffer with the cough until it runs its course. If you take time to go over my history, you will see that this is an ongoing problem for years. The cough comes, stays sometimes for weeks then it goes away for months or even a year. It has a course it has to run. Can't explain it but it's my reality. So far, no cure but there is a temporary solution which is a God-send to me.PERCOSET. So do I make appointment with the NP, or should I just give up pleading? Thank you.

## 2024-01-30 ENCOUNTER — Other Ambulatory Visit: Payer: Self-pay | Admitting: Internal Medicine

## 2024-02-03 ENCOUNTER — Other Ambulatory Visit: Payer: Self-pay | Admitting: Internal Medicine

## 2024-02-13 ENCOUNTER — Ambulatory Visit: Payer: Medicare HMO | Admitting: Pulmonary Disease

## 2024-02-18 ENCOUNTER — Ambulatory Visit: Payer: Medicare HMO | Admitting: Internal Medicine

## 2024-02-18 VITALS — BP 110/72 | Temp 98.1°F | Ht 65.0 in | Wt 176.0 lb

## 2024-02-18 DIAGNOSIS — E1169 Type 2 diabetes mellitus with other specified complication: Secondary | ICD-10-CM | POA: Diagnosis not present

## 2024-02-18 DIAGNOSIS — E663 Overweight: Secondary | ICD-10-CM | POA: Diagnosis not present

## 2024-02-18 DIAGNOSIS — I700 Atherosclerosis of aorta: Secondary | ICD-10-CM | POA: Diagnosis not present

## 2024-02-18 DIAGNOSIS — I119 Hypertensive heart disease without heart failure: Secondary | ICD-10-CM | POA: Diagnosis not present

## 2024-02-18 DIAGNOSIS — J441 Chronic obstructive pulmonary disease with (acute) exacerbation: Secondary | ICD-10-CM | POA: Diagnosis not present

## 2024-02-18 DIAGNOSIS — Z0000 Encounter for general adult medical examination without abnormal findings: Secondary | ICD-10-CM

## 2024-02-18 DIAGNOSIS — Z6829 Body mass index (BMI) 29.0-29.9, adult: Secondary | ICD-10-CM | POA: Diagnosis not present

## 2024-02-18 DIAGNOSIS — E785 Hyperlipidemia, unspecified: Secondary | ICD-10-CM | POA: Diagnosis not present

## 2024-02-18 LAB — POCT URINALYSIS DIPSTICK
Bilirubin, UA: NEGATIVE
Blood, UA: NEGATIVE
Glucose, UA: NEGATIVE
Ketones, UA: NEGATIVE
Leukocytes, UA: NEGATIVE
Nitrite, UA: NEGATIVE
Protein, UA: NEGATIVE
Spec Grav, UA: 1.015 (ref 1.010–1.025)
Urobilinogen, UA: 0.2 U/dL
pH, UA: 6.5 (ref 5.0–8.0)

## 2024-02-18 MED ORDER — HYDROCODONE BIT-HOMATROP MBR 5-1.5 MG/5ML PO SOLN: 5.0000 mL | Freq: Four times a day (QID) | ORAL | 0 refills | Status: DC | PRN

## 2024-02-18 MED ORDER — TRIAMCINOLONE ACETONIDE 40 MG/ML IJ SUSP
60.0000 mg | Freq: Once | INTRAMUSCULAR | Status: AC
  Administered 2024-02-18: 60 mg via INTRAMUSCULAR

## 2024-02-18 MED ORDER — PREGABALIN 100 MG PO CAPS: 100.0000 mg | ORAL_CAPSULE | Freq: Three times a day (TID) | ORAL | 3 refills | Status: DC

## 2024-02-18 NOTE — Assessment & Plan Note (Signed)

## 2024-02-18 NOTE — Patient Instructions (Signed)

## 2024-02-18 NOTE — Progress Notes (Signed)
 I,Jameka J Llittleton, CMA,acting as a Neurosurgeon for Gwynneth Aliment, MD.,have documented all relevant documentation on the behalf of Gwynneth Aliment, MD,as directed by  Gwynneth Aliment, MD while in the presence of Gwynneth Aliment, MD.  Subjective:    Patient ID: [name] , female    DOB: 14-Oct-1946 , 78 y.o.   MRN: [id number]  Chief Complaint  Patient presents with   Annual Exam   Hypertension   Diabetes    HPI  Patient presents today for annual exam.  She is no longer followed by GYN. She reports compliance with meds. Denies headaches, chest pain and shortness of breath. She does admit having a persistent cough.  No fever/chills.   Diabetes She presents for her follow-up diabetic visit. She has type 2 diabetes mellitus. Her disease course has been stable. There are no hypoglycemic associated symptoms. Pertinent negatives for hypoglycemia include no dizziness or headaches. Pertinent negatives for diabetes include no blurred vision, no polydipsia, no polyphagia and no polyuria. There are no hypoglycemic complications. Risk factors for coronary artery disease include diabetes mellitus, dyslipidemia, hypertension, obesity, sedentary lifestyle and post-menopausal. She has not had a previous visit with a dietitian. She participates in exercise three times a week. Her breakfast blood glucose is taken between 8-9 am. Her breakfast blood glucose range is generally 110-130 mg/dl. Eye exam is current.  Hypertension This is a chronic problem. The current episode started more than 1 year ago. The problem has been gradually improving since onset. The problem is controlled. Pertinent negatives include no blurred vision, headaches or palpitations. The current treatment provides moderate improvement. Compliance problems include exercise.      Past Medical History:  Diagnosis Date   Arthritis    Asthma    Complication of anesthesia    COPD (chronic obstructive pulmonary disease) (HCC)    Diabetes  (HCC)    High cholesterol    History of radiation therapy    Right Lung- 12/04/21-12/11/21- Dr. Antony Blackbird   Hypertension    Lung cancer [facility])    Pneumonia 09/10/2017   PONV (postoperative nausea and vomiting)    Stroke (HCC) 2007   mini strokes     Family History  Problem Relation Age of Onset   Heart murmur Mother    Heart attack Father    Diabetes Father    Diabetes Brother      Current Outpatient Medications:    albuterol (VENTOLIN HFA) 108 (90 Base) MCG/ACT inhaler, INHALE 2 PUFFS INTO THE LUNGS EVERY 4 HOURS AS NEEDED FOR WHEEZING OR SHORTNESS OF BREATH, Disp: 18 g, Rfl: 12   aspirin 81 MG tablet, Take 81 mg by mouth daily., Disp: , Rfl:    atorvastatin (LIPITOR) 80 MG tablet, TAKE 1 TABLET BY MOUTH DAILY ON MONDAY-FRIDAY AND SKIP WEEKENDS., Disp: 90 tablet, Rfl: 1   Biotin 40981 MCG TBDP, Take 10,000 mcg by mouth daily., Disp: , Rfl:    bisoprolol (ZEBETA) 5 MG tablet, TAKE 1/2 TABLET BY MOUTH EVERY DAY, Disp: 45 tablet, Rfl: 3   Budeson-Glycopyrrol-Formoterol (BREZTRI AEROSPHERE) 160-9-4.8 MCG/ACT AERO, Inhale 2 puffs into the lungs 2 (two) times daily., Disp: 10.7 g, Rfl: 6   CALCIUM-MAGNESIUM-ZINC PO, Take 1 capsule by mouth at bedtime., Disp: , Rfl:    Cholecalciferol (VITAMIN D3) 2000 UNITS TABS, Take 2,000 Units by mouth at bedtime., Disp: , Rfl:    DULoxetine (CYMBALTA) 60 MG capsule, TAKE 1 CAPSULE(60 MG) BY MOUTH DAILY, Disp: 90 capsule, Rfl: 1  Evolocumab (REPATHA SURECLICK) 140 MG/ML SOAJ, Inject 140 mg into the skin every 14 (fourteen) days., Disp: 6 mL, Rfl: 3   famotidine (PEPCID) 20 MG tablet, Take 1 tablet (20 mg total) by mouth daily after supper., Disp: 90 tablet, Rfl: 3   glucose blood (ACCU-CHEK GUIDE) test strip, 1 each by Other route daily. Use as instructed to check blood sugars 1 time per day dx: e11.22, Disp: 50 each, Rfl: 11   HYDROcodone bit-homatropine (HYDROMET) 5-1.5 MG/5ML syrup, Take 5 mLs by mouth every 6 (six) hours as needed., Disp: 120  mL, Rfl: 0   ipratropium-albuterol (DUONEB) 0.5-2.5 (3) MG/3ML SOLN, USE 3 ML VIA NEBULIZER EVERY 6 HOURS AS NEEDED, Disp: 180 mL, Rfl: 1   Magnesium 250 MG TABS, Take 1 tablet by mouth daily., Disp: , Rfl:    montelukast (SINGULAIR) 10 MG tablet, TAKE 1 TABLET(10 MG) BY MOUTH AT BEDTIME, Disp: 90 tablet, Rfl: 3   pantoprazole (PROTONIX) 40 MG tablet, TAKE 1 TABLET BY MOUTH 30 MINUTES BEFORE FIRST MEAL OF THE DAY, Disp: 90 tablet, Rfl: 2   Semaglutide, 1 MG/DOSE, (OZEMPIC, 1 MG/DOSE,) 4 MG/3ML SOPN, Inject 1 mg into the skin once a week. Friday, Disp: 3 mL, Rfl: 3   pregabalin (LYRICA) 100 MG capsule, Take 1 capsule (100 mg total) by mouth 3 (three) times daily., Disp: 90 capsule, Rfl: 3   Allergies  Allergen Reactions   Other Swelling    HAIR DYE      The patient states she uses post menopausal status for birth control. No LMP recorded. Patient has had a hysterectomy.. Negative for Dysmenorrhea and Negative for Menorrhagia. Negative for: breast discharge, breast lump(s), breast pain and breast self exam. Associated symptoms include abnormal vaginal bleeding. Pertinent negatives include abnormal bleeding (hematology), anxiety, decreased libido, depression, difficulty falling sleep, dyspareunia, history of infertility, nocturia, sexual dysfunction, sleep disturbances, urinary incontinence, urinary urgency, vaginal discharge and vaginal itching. Diet regular.The patient states her exercise level is    . The patient's tobacco use is:  Social History   Tobacco Use  Smoking Status Former   Current packs/day: 0.00   Average packs/day: 0.5 packs/day for 42.0 years (21.0 ttl pk-yrs)   Types: Cigarettes   Start date: 01/30/1967   Quit date: 01/30/2006   Years since quitting: 18.0  Smokeless Tobacco Never  Tobacco Comments   intermittent cycles b/w 1/4-1/2ppd  . She has been exposed to passive smoke. The patient's alcohol use is:  Social History   Substance and Sexual Activity  Alcohol Use Not  Currently   Comment: 1 drink a year, maybe    Review of Systems  Constitutional: Negative.   HENT:  Positive for postnasal drip and rhinorrhea.   Eyes: Negative.  Negative for blurred vision.  Respiratory:  Positive for cough.   Cardiovascular: Negative.  Negative for palpitations.  Gastrointestinal: Negative.   Endocrine: Negative.  Negative for polydipsia, polyphagia and polyuria.  Genitourinary: Negative.   Musculoskeletal: Negative.   Skin: Negative.   Allergic/Immunologic: Negative.   Neurological: Negative.  Negative for dizziness and headaches.  Hematological: Negative.   Psychiatric/Behavioral: Negative.       Today's Vitals   02/18/24 1123  BP: 110/72  Temp: 98.1 F (36.7 C)  SpO2: 98%  Weight: 176 lb (79.8 kg)  Height: 5\' 5"  (1.651 m)   Body mass index is 29.29 kg/m.  Wt Readings from Last 3 Encounters:  02/18/24 176 lb (79.8 kg)  12/18/23 170 lb (77.1 kg)  12/09/23 170 lb (  77.1 kg)     Objective:  Physical Exam Vitals and nursing note reviewed.  Constitutional:      Appearance: Normal appearance.  HENT:     Head: Normocephalic and atraumatic.     Right Ear: Tympanic membrane, ear canal and external ear normal.     Left Ear: Tympanic membrane, ear canal and external ear normal.     Nose:     Comments: Masked     Mouth/Throat:     Comments: Masked  Eyes:     Extraocular Movements: Extraocular movements intact.     Conjunctiva/sclera: Conjunctivae normal.     Pupils: Pupils are equal, round, and reactive to light.  Cardiovascular:     Rate and Rhythm: Normal rate and regular rhythm.     Pulses: Normal pulses.          Dorsalis pedis pulses are 2+ on the right side and 2+ on the left side.     Heart sounds: Normal heart sounds.  Pulmonary:     Effort: Pulmonary effort is normal.     Breath sounds: Wheezing and rhonchi present.  Chest:  Breasts:    Tanner Score is 5.     Right: Normal.     Left: Normal.  Abdominal:     General: Bowel sounds  are normal.     Palpations: Abdomen is soft.  Genitourinary:    Comments: deferred Musculoskeletal:        General: Normal range of motion.     Cervical back: Normal range of motion and neck supple.  Feet:     Right foot:     Protective Sensation: 5 sites tested.  5 sites sensed.     Skin integrity: Dry skin present.     Toenail Condition: Right toenails are normal.     Left foot:     Protective Sensation: 5 sites tested.  5 sites sensed.     Skin integrity: Dry skin present.     Toenail Condition: Left toenails are normal.  Skin:    General: Skin is warm and dry.  Neurological:     General: No focal deficit present.     Mental Status: She is alert and oriented to person, place, and time.  Psychiatric:        Mood and Affect: Mood normal.        Behavior: Behavior normal.         Assessment And Plan:     Encounter for general adult medical examination w/o abnormal findings Assessment & Plan: A full exam was performed.  Importance of monthly self breast exams was discussed with the patient.  She is advised to get 30-45 minutes of regular exercise, no less than four to five days per week. Both weight-bearing and aerobic exercises are recommended.  She is advised to follow a healthy diet with at least six fruits/veggies per day, decrease intake of red meat and other saturated fats and to increase fish intake to twice weekly.  Meats/fish should not be fried -- baked, boiled or broiled is preferable. It is also important to cut back on your sugar intake.  Be sure to read labels - try to avoid anything with added sugar, high fructose corn syrup or other sweeteners.  If you must use a sweetener, you can try stevia or monkfruit.  It is also important to avoid artificially sweetened foods/beverages and diet drinks. Lastly, wear SPF 50 sunscreen on exposed skin and when in direct sunlight for an extended period of  time.  Be sure to avoid fast food restaurants and aim for at least 60 ounces of  water daily.       Hypertensive heart disease without heart failure Assessment & Plan: Chronic, well controlled. EKG performed, NSR w/ RAE.  She will continue with bisoprolol 5mg  daily. She is encouraged to follow low sodium diet.   Orders: -     Microalbumin / creatinine urine ratio -     POCT urinalysis dipstick -     EKG 12-Lead  Dyslipidemia associated with type 2 diabetes mellitus (HCC) Assessment & Plan: Chronic, diabetic foot exam was performed.  LDL goal is less than 70.  She will continue with atorvastatin 80mg  M-F and ASA 81mg  daily.  She takes 1mg  semaglutide weekly for diabetes  She will rto in 4 months.  I DISCUSSED WITH THE PATIENT AT LENGTH REGARDING THE GOALS OF GLYCEMIC CONTROL AND POSSIBLE LONG-TERM COMPLICATIONS.  I  ALSO STRESSED THE IMPORTANCE OF COMPLIANCE WITH HOME GLUCOSE MONITORING, DIETARY RESTRICTIONS INCLUDING AVOIDANCE OF SUGARY DRINKS/PROCESSED FOODS,  ALONG WITH REGULAR EXERCISE.  I  ALSO STRESSED THE IMPORTANCE OF ANNUAL EYE EXAMS, SELF FOOT CARE AND COMPLIANCE WITH OFFICE VISITS.     Orders: -     CBC -     CMP14+EGFR -     Hemoglobin A1c -     TSH  Atherosclerosis of aorta (HCC) Assessment & Plan: Chronic, currently on statin therapy. She will continue with atorvastatin 80mg  M-F and ASA 81mg  daily for now.   COPD exacerbation (HCC) Assessment & Plan: Most recent Pulmonary notes reviewed. She is encouraged to avoid known triggers. She was given Kenalog, 60mg  IM x 1 and hydromet syrup to use prn.  She will let me know if her sx persist.   Orders: -     Triamcinolone Acetonide  Overweight with body mass index (BMI) of 29 to 29.9 in adult Assessment & Plan: Her BMI is acceptable for her demographic.  She is encouraged to aim for at least 150 minutes of exercise per week.    Other orders -     Pregabalin; Take 1 capsule (100 mg total) by mouth 3 (three) times daily.  Dispense: 90 capsule; Refill: 3 -     HYDROcodone Bit-Homatrop MBr; Take 5 mLs  by mouth every 6 (six) hours as needed.  Dispense: 120 mL; Refill: 0     Return for 1 year HM, 4 month dm. Patient was given opportunity to ask questions. Patient verbalized understanding of the plan and was able to repeat key elements of the plan. All questions were answered to their satisfaction.    I, Gwynneth Aliment, MD, have reviewed all documentation for this visit. The documentation on 02/18/24 for the exam, diagnosis, procedures, and orders are all accurate and complete.

## 2024-02-18 NOTE — Assessment & Plan Note (Signed)
Chronic, currently on statin therapy. She will continue with atorvastatin 80mg  M-F and ASA 81mg  daily for now.

## 2024-02-19 ENCOUNTER — Telehealth: Payer: Self-pay

## 2024-02-19 ENCOUNTER — Encounter: Payer: Self-pay | Admitting: Internal Medicine

## 2024-02-19 ENCOUNTER — Telehealth (INDEPENDENT_AMBULATORY_CARE_PROVIDER_SITE_OTHER): Payer: Medicare HMO | Admitting: Internal Medicine

## 2024-02-19 DIAGNOSIS — R053 Chronic cough: Secondary | ICD-10-CM

## 2024-02-19 LAB — TSH: TSH: 0.865 u[IU]/mL (ref 0.450–4.500)

## 2024-02-19 LAB — MICROALBUMIN / CREATININE URINE RATIO
Creatinine, Urine: 243.8 mg/dL
Microalb/Creat Ratio: 8 mg/g{creat} (ref 0–29)
Microalbumin, Urine: 20.4 ug/mL

## 2024-02-19 LAB — CBC
Hematocrit: 42.4 % (ref 34.0–46.6)
Hemoglobin: 14 g/dL (ref 11.1–15.9)
MCH: 29.9 pg (ref 26.6–33.0)
MCHC: 33 g/dL (ref 31.5–35.7)
MCV: 91 fL (ref 79–97)
Platelets: 341 10*3/uL (ref 150–450)
RBC: 4.68 x10E6/uL (ref 3.77–5.28)
RDW: 13.9 % (ref 11.7–15.4)
WBC: 4 10*3/uL (ref 3.4–10.8)

## 2024-02-19 LAB — CMP14+EGFR
ALT: 14 [IU]/L (ref 0–32)
AST: 26 [IU]/L (ref 0–40)
Albumin: 4.2 g/dL (ref 3.8–4.8)
Alkaline Phosphatase: 76 [IU]/L (ref 44–121)
BUN/Creatinine Ratio: 10 — ABNORMAL LOW (ref 12–28)
BUN: 8 mg/dL (ref 8–27)
Bilirubin Total: 0.4 mg/dL (ref 0.0–1.2)
CO2: 28 mmol/L (ref 20–29)
Calcium: 9.4 mg/dL (ref 8.7–10.3)
Chloride: 96 mmol/L (ref 96–106)
Creatinine, Ser: 0.82 mg/dL (ref 0.57–1.00)
Globulin, Total: 2.8 g/dL (ref 1.5–4.5)
Glucose: 82 mg/dL (ref 70–99)
Potassium: 4.8 mmol/L (ref 3.5–5.2)
Sodium: 136 mmol/L (ref 134–144)
Total Protein: 7 g/dL (ref 6.0–8.5)
eGFR: 74 mL/min/{1.73_m2} (ref >59)

## 2024-02-19 LAB — HEMOGLOBIN A1C
Est. average glucose Bld gHb Est-mCnc: 134 mg/dL
Hgb A1c MFr Bld: 6.3 % — ABNORMAL HIGH (ref 4.8–5.6)

## 2024-02-19 NOTE — Patient Instructions (Signed)
Please schedule a follow up office visit in 4 weeks, call sooner if needed with all medications /inhalers/ solutions in hand so we can verify exactly what you are taking. This includes all medications from all doctors and over the counters - PLEASE separate them into two bags:  the ones you take automatically, no matter what, vs the ones you take just when you feel you need them "BAG #2 is UP TO YOU"  - this will really help [place] help you take your medications more effectively.

## 2024-02-19 NOTE — Progress Notes (Unsigned)
Subjective:   Patient ID: [name], female    DOB: [birth year]    MRN: [id number]    Brief patient profile:  35 yobf mother of a PA @ [place]  fast med  quit smoking 2007 with chronic cough x 2011 self referred for evaluation of persistent cough in setting of technically GOLD II copd dx 02/2014 with only mild obstructive pattern - has noted since high school a tendency to daily  tickle in throat causing intermittent cough waxing and waning ever since    History of Present Illness  10/19/2014 1st [facility] office visit/ [name]   Chief Complaint  Patient presents with   Pulmonary Consult    Former pt of Dr. Maple Hudson. Pt c/o increased cough x 3 wks. Cough is prod with moderate, thick, light yellow sputum.  She coughs until loses urinary continence.  Not sleeping well due to cough.   remembers having tendency to throat tickle in high school  but completely resolved by graduation  then recurred in her 93s and resolved with codeine then recurred in 2011  but bad to worse x 3 weeks assoc with sensation of choking, mucus is thick light yellow.  acei stopped about a month prior to OV  Replaced by losartan  Inhalers make it worse, esp dpi  Kouffman Reflux v Neurogenic Cough Differentiator Reflux Comments  Do you awaken from a sound sleep coughing violently?                            With trouble breathing? Yes   Do you have choking episodes when you cannot  Get enough air, gasping for air ?              Yes   Do you usually cough when you lie down into  The bed, or when you just lie down to rest ?                          Yes   Do you usually cough after meals or eating?         maybe   Do you cough when (or after) you bend over?    No    GERD SCORE     Kouffman Reflux v Neurogenic Cough Differentiator Neurogenic   Do you more-or-less cough all day long? yes   Does change of temperature make you cough? no   Does laughing or chuckling cause you to cough? no   Do fumes (perfume,  automobile fumes, burned  Toast, etc.,) cause you to cough ?      Not aware   Does speaking, singing, or talking on the phone cause you to cough   ?               sometimes   Neurogenic/Airway score      rec  Max rx gerd/prn tramadol        09/15/2017  Transition of care  f/u ov/[name] re: COPD II s/p aecopd Chief Complaint  Patient presents with   Hospitalization Follow-up    Breathing has improved some, but not back at her normal baseline. She states her cough woke her up this am and had some bloody nasal d/c.  She has been coughing up some thick, yellow sputum.  She is using   walking all over [place] fine,  Flew back to [place]  on a commercial jet and short of  breath  walking from gate [place] [employer] [place] then by 2 days later in ER due to severe sob / tightness across upper back >> albuterol relived the discomfort w/in 5 min but did not have her rescue so had to call 911 (proair listed on med calendar she just got from me 08/19/17 under written action plan) - says was so short of breath at that point could not have used saba anyway. Not following action plan either re controlling cough which is also worse/ has not started meds from discharge  rec Plan A = Automatic = symbicort 80 Take 2 puffs first thing in am and then another 2 puffs about 12 hours later.  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 She rarely uses her albuterol inhaler, but has been using neb almost every night.   Dyspnea:  Gets up around 630 am does better after taking am breztri   Cough: sporadic but dry  / less need for tramadol on gabapentin usually just taking 300 bid  Sleeping: fine 2 pillows  SABA use: every other night sev hours p supper  feels like needs  nebulizer even p pm dose of breztri 02: none rec Change gabapentin to 300 mg 4 x daily  Change pepcid 20 mg after supper  Only use your albuterol as a rescue medication  Bed blocks are strongly recommended      07/23/21 televisit Plan A = Automatic = Always=    Breztri Take 2 puffs first thing in am and then another 2 puffs about 12 hours later.  Gabapentin 300 mg four times daily until cough is gone then taper twice daily.  Plan B = Backup (to supplement plan A, not to replace it) Only use your albuterol inhaler as a rescue medication  Plan C = Crisis (instead of Plan B but only if Plan B stops working) - only use your albuterol nebulizer if you first try Plan B and it fails to help > ok to use the nebulizer up to every 4 hours but if start needing it regularly call for immediate appointment Prednisone 10 mg take  4 each am x 2 days,   2 each am x 2 days,  1 each am x 2 days and stop  Please schedule a follow up office visit in 6 weeks, call sooner if needed - bring all your medications /inhalers/ solutions Check cxr  08/05/21 ? 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 SOB waking her up. She is coughing up thick, off white sputum.   Worse x 3 days / no recent  prednisone assoc with worse cough x sev weeks silvery mucus no blood / assoc subjective wheeze/ no obvious assoc rhinitis / mild dysphagia but no worse p meals  L cp now seeing neurology / pain clinic / not pleuritic  Rec Plan A = Automatic = Always=  Breztri Take 2 puffs first thing in am and then another 2 puffs about 12 hours later.  Gabapentin 300 mg four times a daily  Work on inhaler technique:  Plan B = Backup (to supplement plan A, not to replace it) (unless cough is the issue in which  case more on to plan C) Only use your albuterol inhaler as a rescue medication  Plan C = Crisis (instead of Plan B but only if Plan B stops working) - only use your albuterol nebulizer (ok to use up duoneb which has ipatrpium) if you first try Plan B  Plan D = Deltasone 10 mg  if  ABC not helping  Prednisone 10 mg take  4 each am x 2 days,   2 each am x 2 days,  1 each am x 2 days and stop  For cough > mucinex dm 1200 mg every 12 hours as needed as use flutter valve with oxycodone supplement    I personally reviewed images and agree with radiology impression as follows:   Chest CT cuts on coronary study    01/08/24 1. Stable right hilar adenopathy. 2. Stable 3 mm right middle lobe nodule. 3. Left basilar subsegmental atelectasis or scarring       Virtual Visit via Telephone Note 02/19/2024   I connected with [name] on 02/19/24 at  1:30 PM EST by telephone and verified that I am speaking with the correct person using two identifiers. Pt is at home and this call made from my office with no other participants    I discussed the limitations, risks, security and privacy concerns of performing an evaluation and management service virtually  and the availability of in person appointments. I also discussed with the patient that there may be a patient responsible charge related to this service. The patient expressed understanding and agreed to proceed.   History of Present Illness: maint rx breztri  Dyspnea:  walking into church is a problem, uses albuterol seems to help but really not pre treating or rechallengina as rec   Cough: sporadic / persistent sensation of globus with relief of cough only with oxycodone Sleeping: flat bed 3 pillows not much cough and no sob   SABA use: as above 02: none    Meds reviewed/ med reconciliation completed     Current Meds - - NOTE:   Unable to verify as accurately reflecting what pt takes    Medication Sig   albuterol (VENTOLIN HFA) 108 (90 Base)  MCG/ACT inhaler INHALE 2 PUFFS INTO THE LUNGS EVERY 4 HOURS AS NEEDED FOR WHEEZING OR SHORTNESS OF BREATH   aspirin 81 MG tablet Take 81 mg by mouth daily.   atorvastatin (LIPITOR) 80 MG tablet TAKE 1 TABLET BY MOUTH DAILY ON MONDAY-FRIDAY AND SKIP WEEKENDS.   Biotin 09811 MCG TBDP Take 10,000 mcg by mouth daily.   bisoprolol (ZEBETA) 5 MG tablet TAKE 1/2 TABLET BY MOUTH EVERY DAY   Budeson-Glycopyrrol-Formoterol (BREZTRI AEROSPHERE) 160-9-4.8 MCG/ACT AERO Inhale 2 puffs into the lungs 2 (two) times daily.   CALCIUM-MAGNESIUM-ZINC PO Take 1 capsule by mouth at bedtime.   Cholecalciferol (VITAMIN D3) 2000 UNITS TABS Take 2,000 Units by mouth at bedtime.   DULoxetine (CYMBALTA) 60 MG capsule TAKE 1 CAPSULE(60 MG) BY MOUTH DAILY   Evolocumab (REPATHA SURECLICK) 140 MG/ML SOAJ Inject 140 mg into the skin every 14 (fourteen) days.   famotidine (PEPCID) 20 MG tablet Take 1 tablet (20 mg total) by mouth daily after supper.  glucose blood (ACCU-CHEK GUIDE) test strip 1 each by Other route daily. Use as instructed to check blood sugars 1 time per day dx: e11.22   HYDROcodone bit-homatropine (HYDROMET) 5-1.5 MG/5ML syrup Take 5 mLs by mouth every 6 (six) hours as needed.   ipratropium-albuterol (DUONEB) 0.5-2.5 (3) MG/3ML SOLN USE 3 ML VIA NEBULIZER EVERY 6 HOURS AS NEEDED   Magnesium 250 MG TABS Take 1 tablet by mouth daily.   montelukast (SINGULAIR) 10 MG tablet TAKE 1 TABLET(10 MG) BY MOUTH AT BEDTIME   pantoprazole (PROTONIX) 40 MG tablet TAKE 1 TABLET BY MOUTH 30 MINUTES BEFORE FIRST MEAL OF THE DAY   pregabalin (LYRICA) 100 MG capsule Take 1 capsule (100 mg total) by mouth 3 (three) times daily.   Semaglutide, 1 MG/DOSE, (OZEMPIC, 1 MG/DOSE,) 4 MG/3ML SOPN Inject 1 mg into the skin once a week. Friday      PEx: Nad/ no spont cough or throat clearing, good voice texture s conversational sob   Studies: I personally reviewed images and agree with radiology impression as follows:   Chest CTcuts on  coronary study    01/08/24  1. Stable right hilar adenopathy. 2. Stable 3 mm right middle lobe nodule. 3. Left basilar subsegmental atelectasis or scarrin     Imp/ Plan :  see problem list    I discussed the assessment and treatment plan with the patient. The patient was provided an opportunity to ask questions and all were answered. The patient agreed with the plan and demonstrated an understanding of the instructions.   The patient was advised to call back or seek an in-person evaluation if the symptoms worsen or if the condition fails to improve as anticipated.  I provided 25 minutes of non-face-to-face time during this encounter.   Sandrea Hughs, MD

## 2024-02-19 NOTE — Telephone Encounter (Addendum)
Attempted to call patient.  Left message on both home and cell numbers.  Patients in-person OV was changed to a video visit today at 1:30 pm.  Per Dr. Sherene Sires, this patient is on a narcotic cough medication (Hydromet).  Dr. Sherene Sires requires that this patient come in person to be seen to get refills on this type of medication.  Chart shows patients PCP, Dr. Allyne Gee filled the Hydromet yesterday.  Will inform Dr. Sherene Sires of refill.  Left message on patients VM for patient to call clinic to reschedule today's visit back to an in-person visit.  Will try to call patient again before noon.

## 2024-02-19 NOTE — Telephone Encounter (Signed)
Patient called clinic.  Front desk took call.  Patient states her PCP filled the Hydromet and she does not need this refill.  Per Dr. Sherene Sires, he will see patient as a virtual visit today without refilling the narcotic medication. Front desk informed patient.  Patient scheduled for virtual visit today at 1:30 pm.

## 2024-02-20 ENCOUNTER — Encounter: Payer: Self-pay | Admitting: Internal Medicine

## 2024-02-20 NOTE — Assessment & Plan Note (Signed)
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 - try off cozar 11/03/2014 >>> no better  - neurontin 100 tid rx 11/30/14 > improved 01/03/2015 > changed to lyrica 04/2015 but not taking consistently as of 01/30/2016  - sinus CT 02/17/15 > neg  - FENO  04/14/17 =  14  - rechallenged with gabapentin 300 qid 07/22/2018 and if not better > refer to Dr Delford Field at [facility] voice [facility] > did not go, referred again 01/07/2019 > see eval 03/02/2019 rec speech therapy/tramadol (pt refused latter)   - flare early Sep 2021 off gabapentin and on breztri > changed back to gabapentin / symb 80 2bid > preferred breztri  - 11/21/2020 rec increase gabapentin to 300 qid to eliminate need for tramadol  - 12/28/2020 acute breakthru despite following instructions x for bed blocks > rx oxy 5 mg q 4 hr prn - HRCT  08/23/21  1. No findings to suggest interstitial lung disease. 2. However, there is a 1.6 x 1.6 x 1.8 cm aggressive appearing nodule in the posterior aspect of the right upper lobe which is highly concerning for primary bronchogenic neoplasm. No definite mediastinal or hilar lymphadenopathy confidently identified on today's noncontrast CT examination. Further evaluation with PET-CT is recommended in the near future to better evaluate this finding. 3. Diffuse bronchial wall thickening with mild centrilobular and paraseptal emphysema; imaging findings suggestive of underlying COPD. RT curative  12/04/2021 through 12/11/2021  Not able to assess this over the phone or offer oxycodone as "the only thing that works"  until I have a chance to see her in person.  However, the good news is that her breathing is baseline, the cough does not not awaken her and assoc with globus sensation is apparently life long and already eval by best ENT in the region (and note failed to follow recs) so all I can do is offer to see her in person next  availabilty in [place] ( or she can come to [facility] office ) but must return with all meds in hand using a trust but verify approach to confirm accurate Medication  Reconciliation The principal here is that until we are certain that the  patients are doing what we've asked, it makes no sense to ask them to do more.

## 2024-02-21 DIAGNOSIS — J441 Chronic obstructive pulmonary disease with (acute) exacerbation: Secondary | ICD-10-CM | POA: Insufficient documentation

## 2024-02-21 NOTE — Assessment & Plan Note (Signed)
 Chronic, well controlled. EKG performed, NSR w/ RAE.  She will continue with bisoprolol 5mg  daily. She is encouraged to follow low sodium diet.

## 2024-02-21 NOTE — Assessment & Plan Note (Signed)
 Chronic, diabetic foot exam was performed.  LDL goal is less than 70.  She will continue with atorvastatin 80mg  M-F and ASA 81mg  daily.  She takes 1mg  semaglutide weekly for diabetes  She will rto in 4 months.  I DISCUSSED WITH THE PATIENT AT LENGTH REGARDING THE GOALS OF GLYCEMIC CONTROL AND POSSIBLE LONG-TERM COMPLICATIONS.  I  ALSO STRESSED THE IMPORTANCE OF COMPLIANCE WITH HOME GLUCOSE MONITORING, DIETARY RESTRICTIONS INCLUDING AVOIDANCE OF SUGARY DRINKS/PROCESSED FOODS,  ALONG WITH REGULAR EXERCISE.  I  ALSO STRESSED THE IMPORTANCE OF ANNUAL EYE EXAMS, SELF FOOT CARE AND COMPLIANCE WITH OFFICE VISITS.

## 2024-02-21 NOTE — Assessment & Plan Note (Signed)
Her BMI is acceptable for her demographic. She is encouraged to aim for at least 150 minutes of exercise per week.

## 2024-02-21 NOTE — Assessment & Plan Note (Signed)
 Most recent Pulmonary notes reviewed. She is encouraged to avoid known triggers. She was given Kenalog, 60mg  IM x 1 and hydromet syrup to use prn.  She will let me know if her sx persist.

## 2024-02-24 ENCOUNTER — Encounter: Payer: Self-pay | Admitting: Internal Medicine

## 2024-02-26 ENCOUNTER — Encounter: Payer: Self-pay | Admitting: Internal Medicine

## 2024-02-26 ENCOUNTER — Other Ambulatory Visit: Payer: Self-pay | Admitting: Internal Medicine

## 2024-02-26 MED ORDER — OXYCODONE-ACETAMINOPHEN 10-325 MG PO TABS: 1.0000 | ORAL_TABLET | ORAL | 0 refills | Status: DC | PRN

## 2024-03-03 ENCOUNTER — Telehealth: Payer: Self-pay | Admitting: Internal Medicine

## 2024-03-03 NOTE — Telephone Encounter (Signed)
 Forwarding

## 2024-03-03 NOTE — Telephone Encounter (Signed)
 duoneb qid for a few weeks and leave off all inhalers if possible since they cause more cough   Needs nebulizer and duoneb qid and f/u ov with all meds /inhalers/ solutions in hand w/in a month

## 2024-03-03 NOTE — Telephone Encounter (Signed)
 Please see MYCHART msg from 2/28:  Dorna Mai, CMA To [place], [name] and Delivered 02/27/2024  1:22 PM  Last Read in MyChart 03/03/2024  3:33 PM by [name] C     Per Dr. Sherene Sires he recommends stopping all inhalers and only using the DuoNeb 4 times per day for 2-3 weeks. He also recommends CHLORPHENIRAMINE  4 mg  ("Allergy Relief" 4mg   at [facility] should be easiest to find in the blue box usually on bottom shelf)  take one every 4 hours as needed. He also recommends if these changes do not seem to help you would need to be seen in the office to discuss other treatment options. We cannot continue to prescribe controlled substances and may need to find alternative medications. He also says you can return to Dr. Delford Field if you would like to see what he could recommend.    Please let us know if you have any additional questions or concerns.  Thank you and stay well, [facility]    PT has FU questions about this message. Please call @ [phone]

## 2024-03-03 NOTE — Telephone Encounter (Signed)
 Patient has been rx'd Oxy 18 times since 2018. She has received Hydromet 02/18/24 and Oxycodone 02/26/24 from PCP. She is blaming Dr. Sherene Sires for not prescribing this for her, which then caused her to have to reach out to her PCP. See MyChart message dated 02/26/24.   Just FYI based on notes in chart. Thanks!

## 2024-03-05 ENCOUNTER — Encounter: Payer: Self-pay | Admitting: Internal Medicine

## 2024-03-08 ENCOUNTER — Other Ambulatory Visit: Payer: Self-pay

## 2024-03-08 DIAGNOSIS — Z1231 Encounter for screening mammogram for malignant neoplasm of breast: Secondary | ICD-10-CM | POA: Diagnosis not present

## 2024-03-08 LAB — HM MAMMOGRAPHY

## 2024-03-08 MED ORDER — IPRATROPIUM-ALBUTEROL 0.5-2.5 (3) MG/3ML IN SOLN: RESPIRATORY_TRACT | 1 refills | Status: DC

## 2024-03-09 ENCOUNTER — Other Ambulatory Visit: Payer: Self-pay

## 2024-03-09 ENCOUNTER — Encounter: Payer: Self-pay | Admitting: Internal Medicine

## 2024-03-09 MED ORDER — ATORVASTATIN CALCIUM 80 MG PO TABS: ORAL_TABLET | ORAL | 1 refills | Status: DC

## 2024-03-11 ENCOUNTER — Encounter: Payer: Self-pay | Admitting: Internal Medicine

## 2024-03-15 ENCOUNTER — Encounter: Payer: Self-pay | Admitting: Internal Medicine

## 2024-03-16 NOTE — Progress Notes (Unsigned)
 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 Maple Hudson. Pt c/o increased cough x 3 wks. Cough is prod with moderate, thick, light yellow sputum.  She coughs until loses urinary continence.  Not sleeping well due to cough.   remembers having tendency to throat tickle in high school  but completely resolved by graduation  then recurred in her 82s and resolved with codeine then recurred in 2011  but bad to worse x 3 weeks assoc with sensation of choking, mucus is thick light yellow.  acei stopped about a month prior to OV  Replaced by losartan  Inhalers make it worse, esp dpi  Kouffman Reflux v Neurogenic Cough Differentiator Reflux Comments  Do you awaken from a sound sleep coughing violently?                            With trouble breathing? Yes   Do you have choking episodes when you cannot  Get enough air, gasping for air ?              Yes   Do you usually cough when you lie down into  The bed, or when you just lie down to rest ?                          Yes   Do you usually cough after meals or eating?         maybe   Do you cough when (or after) you bend over?    No    GERD SCORE     Kouffman Reflux v Neurogenic Cough Differentiator Neurogenic   Do you more-or-less cough all day long? yes   Does change of temperature make you cough? no   Does laughing or chuckling cause you to cough? no   Do fumes (perfume,  automobile fumes, burned  Toast, etc.,) cause you to cough ?      Not aware   Does speaking, singing, or talking on the phone cause you to cough   ?               sometimes   Neurogenic/Airway score      rec  Max rx gerd/prn tramadol        09/15/2017  Transition of care  f/u ov/[name] re: COPD II s/p aecopd Chief Complaint  Patient presents with   Hospitalization Follow-up    Breathing has improved some, but not back at her normal baseline. She states her cough woke her up this am and had some bloody nasal d/c.  She has been coughing up some thick, yellow sputum.  She is using   walking all over [place] fine,  Flew back to [place]  on a commercial jet and short of  breath  walking from gate [place] then by 2 days later in ER due to severe sob / tightness across upper back >> albuterol relived the discomfort w/in 5 min but did not have her rescue so had to call 911 (proair listed on med calendar she just got from me 08/19/17 under written action plan) - says was so short of breath at that point could not have used saba anyway. Not following action plan either re controlling cough which is also worse/ has not started meds from discharge  rec Plan A = Automatic = symbicort 80 Take 2 puffs first thing in am and then another 2 puffs about 12 hours later.  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 She rarely uses her albuterol inhaler, but has been using neb almost every night.   Dyspnea:  Gets up around 630 am does better after taking am breztri   Cough: sporadic but dry  / less need for tramadol on gabapentin usually just taking 300 bid  Sleeping: fine 2 pillows  SABA use: every other night sev hours p supper  feels like needs  nebulizer even p pm dose of breztri 02: none rec Change gabapentin to 300 mg 4 x daily  Change pepcid 20 mg after supper  Only use your albuterol as a rescue medication  Bed blocks are strongly recommended      07/23/21 televisit Plan A = Automatic = Always=    Breztri Take 2 puffs first thing in am and then another 2 puffs about 12 hours later.  Gabapentin 300 mg four times daily until cough is gone then taper twice daily.  Plan B = Backup (to supplement plan A, not to replace it) Only use your albuterol inhaler as a rescue medication  Plan C = Crisis (instead of Plan B but only if Plan B stops working) - only use your albuterol nebulizer if you first try Plan B and it fails to help > ok to use the nebulizer up to every 4 hours but if start needing it regularly call for immediate appointment Prednisone 10 mg take  4 each am x 2 days,   2 each am x 2 days,  1 each am x 2 days and stop  Please schedule a follow up office visit in 6 weeks, call sooner if needed - bring all your medications /inhalers/ solutions Check cxr  08/05/21 ? ILD > HRCT > RUL nodule > PET pos stage I  > adeno ca     RT curative  12/04/2021 through 12/11/2021   05/01/2022 acute extended ov/[name] re: sob/cough/ wheeze maint on breztri 2 bid  Chief Complaint  Patient presents with   Acute Visit    Increased SOB, cough and wheezing x 3 days. SOB waking her up. She is coughing up thick, off white sputum.   Worse x 3 days / no recent  prednisone assoc with worse cough x sev weeks silvery mucus no blood / assoc subjective wheeze/ no obvious assoc rhinitis / mild dysphagia but no worse p meals  L cp now seeing neurology / pain clinic / not pleuritic  Rec Plan A = Automatic =  Always=   Breztri Take 2 puffs first thing in am and then another 2 puffs about 12 hours later.  Gabapentin 300 mg four times a daily  Work on inhaler technique:  Plan B = Backup (to supplement plan A, not to replace it) (unless cough is the issue in  which case more on to plan C) Only use your albuterol inhaler as a rescue medication  Plan C = Crisis (instead of Plan B but only if Plan B stops working) - only use your albuterol nebulizer (ok to use up duoneb which has ipatrpium) if you first try Plan B  Plan D = Deltasone 10 mg  if  ABC not helping  Prednisone 10 mg take  4 each am x 2 days,   2 each am x 2 days,  1 each am x 2 days and stop  For cough > mucinex dm 1200 mg every 12 hours as needed as use flutter valve with oxycodone supplement       03/17/2024  f/u ov/[name] re: AB/cough   maint on duoneb   did  bring meds not bring flutter / was not able to recall the above written action plan and did not know Plan D was renewable and ran out months ago.  Chief Complaint  Patient presents with   Follow-up  Dyspnea:  worse since stopped breztri  Cough: thick beige  Sleeping: bed is flat with 3 pillows  SABA use: duoneb (as supposed to be saba only) 02: none      No obvious day to day or daytime variability or assoc excess/ purulent sputum or mucus plugs or hemoptysis or cp or chest tightness, subjective wheeze or overt sinus or hb symptoms.    Also denies any obvious fluctuation of symptoms with weather or environmental changes or other aggravating or alleviating factors except as outlined above   No unusual exposure hx or h/o childhood pna/ asthma or knowledge of premature birth.  Current Allergies, Complete Past Medical History, Past Surgical History, Family History, and Social History were reviewed in [employer] record.  ROS  The following are not active complaints unless bolded Hoarseness, sore throat, dysphagia, dental problems, itching, sneezing,  nasal congestion or discharge of excess mucus or purulent secretions, ear ache,   fever, chills, sweats, unintended wt loss or wt gain, classically pleuritic or exertional cp,  orthopnea pnd or arm/hand swelling  or leg swelling, presyncope, palpitations,  abdominal pain, anorexia, nausea, vomiting, diarrhea  or change in bowel habits or change in bladder habits, change in stools or change in urine, dysuria, hematuria,  rash, arthralgias, visual complaints, headache, numbness, weakness or ataxia or problems with walking or coordination,  change in mood or  memory.        Current Meds  Medication Sig   albuterol (PROVENTIL) (2.5 MG/3ML) 0.083% nebulizer solution Take 3 mLs (2.5 mg total) by nebulization every 4 (four) hours as needed.   albuterol (VENTOLIN HFA) 108 (90 Base) MCG/ACT inhaler INHALE 2 PUFFS INTO THE LUNGS EVERY 4 HOURS AS NEEDED FOR WHEEZING OR SHORTNESS OF BREATH   aspirin 81 MG tablet Take 81 mg by mouth daily.   atorvastatin (LIPITOR) 80 MG tablet TAKE 1 TABLET BY MOUTH DAILY ON MONDAY-FRIDAY.   Biotin 04540 MCG TBDP Take 10,000 mcg by mouth daily.   bisoprolol (ZEBETA) 5 MG tablet TAKE 1/2 TABLET BY MOUTH EVERY DAY   Budeson-Glycopyrrol-Formoterol (BREZTRI AEROSPHERE) 160-9-4.8 MCG/ACT AERO Inhale 2 puffs into the lungs 2 (two) times  daily.   CALCIUM-MAGNESIUM-ZINC PO Take 1 capsule by mouth at bedtime.   Cholecalciferol (VITAMIN D3) 2000 UNITS TABS Take 2,000 Units by mouth at bedtime.   DULoxetine (CYMBALTA) 60 MG capsule TAKE 1 CAPSULE(60 MG) BY MOUTH DAILY   Evolocumab (REPATHA SURECLICK) 140 MG/ML SOAJ Inject 140 mg into the skin every 14 (fourteen) days.   famotidine (PEPCID) 20 MG tablet Take 1 tablet (20 mg total) by mouth daily after supper.   glucose blood (ACCU-CHEK GUIDE) test strip 1 each by Other route daily. Use as instructed to check blood sugars 1 time per day dx: e11.22   Magnesium 250 MG TABS Take 1 tablet by mouth daily.   montelukast (SINGULAIR) 10 MG tablet TAKE 1 TABLET(10 MG) BY MOUTH AT BEDTIME   pantoprazole (PROTONIX) 40 MG tablet TAKE 1 TABLET BY MOUTH 30 MINUTES BEFORE FIRST MEAL OF THE DAY   predniSONE (DELTASONE) 10 MG tablet 2 daily  until better, then 1 daily x 5 days and stop   pregabalin  (LYRICA) 100 MG capsule Take 1 capsule (100 mg total) by mouth 3 (three) times daily.   Semaglutide, 1 MG/DOSE, (OZEMPIC, 1 MG/DOSE,) 4 MG/3ML SOPN Inject 1 mg into the skin once a week. Friday   [DISCONTINUED] ipratropium-albuterol (DUONEB) 0.5-2.5 (3) MG/3ML SOLN USE 3 ML VIA NEBULIZER EVERY 6 HOURS AS NEEDED                            Objective:   Physical Exam  wts  03/17/2024       173  05/01/2022        185 09/13/2021      197   02/26/2021      191 11/21/2020    191  04/16/2019      193 07/22/2018      195 04/20/2018      189  10/23/2016     190 04/07/2015         207     Vital signs reviewed  03/17/2024  - Note at rest 02 sats  93% on RA   General appearance:    amb bf with wheezing heard across the room  HEENT :  Oropharynx  clear   Nasal turbinates nl    NECK :  without JVD/Nodes/TM/ nl carotid upstrokes bilaterally/ prominent pseudowheeze    LUNGS: no acc muscle use,  Mod barrel  contour chest wall with bilateral  Distant bs s audible wheeze and  without cough on insp or exp maneuvers and mod  Hyperresonant  to  percussion bilaterally     CV:  RRR  no s3 or murmur or increase in P2, and no edema   ABD:  soft and nontender with pos mid insp Hoover's  in the supine position. No bruits or organomegaly appreciated, bowel sounds nl  MS:   Ext warm without deformities or   obvious joint restrictions , calf tenderness, cyanosis or clubbing  SKIN: warm and dry without lesions    NEURO:  alert, approp, nl sensorium with  no motor or cerebellar deficits apparent.

## 2024-03-17 ENCOUNTER — Ambulatory Visit (INDEPENDENT_AMBULATORY_CARE_PROVIDER_SITE_OTHER): Payer: Medicare HMO

## 2024-03-17 ENCOUNTER — Ambulatory Visit: Payer: Medicare HMO | Admitting: Internal Medicine

## 2024-03-17 ENCOUNTER — Encounter: Payer: Self-pay | Admitting: Internal Medicine

## 2024-03-17 VITALS — BP 120/70 | HR 85 | Temp 97.8°F | Ht 66.0 in | Wt 173.4 lb

## 2024-03-17 VITALS — BP 116/60 | HR 96 | Temp 98.2°F | Ht 65.0 in | Wt 175.2 lb

## 2024-03-17 DIAGNOSIS — R911 Solitary pulmonary nodule: Secondary | ICD-10-CM

## 2024-03-17 DIAGNOSIS — Z87891 Personal history of nicotine dependence: Secondary | ICD-10-CM | POA: Diagnosis not present

## 2024-03-17 DIAGNOSIS — Z0000 Encounter for general adult medical examination without abnormal findings: Secondary | ICD-10-CM | POA: Diagnosis not present

## 2024-03-17 DIAGNOSIS — J4489 Other specified chronic obstructive pulmonary disease: Secondary | ICD-10-CM | POA: Diagnosis not present

## 2024-03-17 DIAGNOSIS — R053 Chronic cough: Secondary | ICD-10-CM

## 2024-03-17 MED ORDER — OXYCODONE-ACETAMINOPHEN 10-325 MG PO TABS: 1.0000 | ORAL_TABLET | ORAL | 0 refills | Status: DC | PRN

## 2024-03-17 MED ORDER — ALBUTEROL SULFATE (2.5 MG/3ML) 0.083% IN NEBU: 2.5000 mg | INHALATION_SOLUTION | RESPIRATORY_TRACT | 12 refills | Status: DC | PRN

## 2024-03-17 MED ORDER — PREDNISONE 10 MG PO TABS: ORAL_TABLET | ORAL | 0 refills | Status: DC

## 2024-03-17 NOTE — Progress Notes (Signed)
 Subjective:   [name] is a 78 y.o. who presents for a Medicare Wellness preventive visit.  Visit Complete: In person    Persons Participating in Visit: n/a  AWV Questionnaire: Yes: Patient Medicare AWV questionnaire was completed by the patient on 03/13/2024; I have confirmed that all information answered by patient is correct and no changes since this date.  Cardiac Risk Factors include: advanced age (>84men, >59 women);diabetes mellitus;dyslipidemia;hypertension     Objective:    Today's Vitals   03/17/24 0903  BP: 116/60  Pulse: 96  Temp: 98.2 F (36.8 C)  TempSrc: Oral  Weight: 175 lb 3.2 oz (79.5 kg)  Height: 5\' 5"  (1.651 m)   Body mass index is 29.15 kg/m.     03/17/2024    9:08 AM 10/09/2023   11:12 AM 04/07/2023   11:17 AM 03/05/2023    9:55 AM 12/12/2022    7:30 AM 10/03/2022   11:09 AM 04/30/2022   11:44 AM  Advanced Directives  Does Patient Have a Medical Advance Directive? No No No No No No No  Would patient like information on creating a medical advance directive? No - Patient declined   No - Patient declined No - Patient declined No - Patient declined No - Patient declined    Current Medications (verified) Outpatient Encounter Medications as of 03/17/2024  Medication Sig   albuterol (VENTOLIN HFA) 108 (90 Base) MCG/ACT inhaler INHALE 2 PUFFS INTO THE LUNGS EVERY 4 HOURS AS NEEDED FOR WHEEZING OR SHORTNESS OF BREATH   aspirin 81 MG tablet Take 81 mg by mouth daily.   atorvastatin (LIPITOR) 80 MG tablet TAKE 1 TABLET BY MOUTH DAILY ON MONDAY-FRIDAY.   Biotin 16109 MCG TBDP Take 10,000 mcg by mouth daily.   bisoprolol (ZEBETA) 5 MG tablet TAKE 1/2 TABLET BY MOUTH EVERY DAY   Budeson-Glycopyrrol-Formoterol (BREZTRI AEROSPHERE) 160-9-4.8 MCG/ACT AERO Inhale 2 puffs into the lungs 2 (two) times daily.   CALCIUM-MAGNESIUM-ZINC PO Take 1 capsule by mouth at bedtime.   Cholecalciferol (VITAMIN D3) 2000 UNITS TABS Take 2,000 Units by mouth at bedtime.    DULoxetine (CYMBALTA) 60 MG capsule TAKE 1 CAPSULE(60 MG) BY MOUTH DAILY   Evolocumab (REPATHA SURECLICK) 140 MG/ML SOAJ Inject 140 mg into the skin every 14 (fourteen) days.   famotidine (PEPCID) 20 MG tablet Take 1 tablet (20 mg total) by mouth daily after supper.   glucose blood (ACCU-CHEK GUIDE) test strip 1 each by Other route daily. Use as instructed to check blood sugars 1 time per day dx: e11.22   ipratropium-albuterol (DUONEB) 0.5-2.5 (3) MG/3ML SOLN USE 3 ML VIA NEBULIZER EVERY 6 HOURS AS NEEDED   Magnesium 250 MG TABS Take 1 tablet by mouth daily.   montelukast (SINGULAIR) 10 MG tablet TAKE 1 TABLET(10 MG) BY MOUTH AT BEDTIME   pantoprazole (PROTONIX) 40 MG tablet TAKE 1 TABLET BY MOUTH 30 MINUTES BEFORE FIRST MEAL OF THE DAY   pregabalin (LYRICA) 100 MG capsule Take 1 capsule (100 mg total) by mouth 3 (three) times daily.   Semaglutide, 1 MG/DOSE, (OZEMPIC, 1 MG/DOSE,) 4 MG/3ML SOPN Inject 1 mg into the skin once a week. Friday   oxyCODONE-acetaminophen (PERCOCET) 10-325 MG tablet Take 1 tablet by mouth every 4 (four) hours as needed.   No facility-administered encounter medications on file as of 03/17/2024.    Allergies (verified) Other   History: Past Medical History:  Diagnosis Date   Allergy 09/30/1996   Arthritis    Asthma    Complication  of anesthesia    COPD (chronic obstructive pulmonary disease) (HCC)    Diabetes (HCC)    High cholesterol    History of radiation therapy    Right Lung- 12/04/21-12/11/21- Dr. Antony Blackbird   Hypertension    Lung cancer [facility])    Neuromuscular disorder (HCC)    Pneumonia 09/10/2017   PONV (postoperative nausea and vomiting)    Stroke [facility]) 2007   mini strokes   Past Surgical History:  Procedure Laterality Date   ABDOMINAL HYSTERECTOMY  1995   BRONCHIAL BIOPSY  10/23/2021   Procedure: BRONCHIAL BIOPSIES;  Surgeon: Josephine Igo, DO;  Location: [facility];  Service: Pulmonary;;   BRONCHIAL BRUSHINGS  10/23/2021   Procedure:  BRONCHIAL BRUSHINGS;  Surgeon: Josephine Igo, DO;  Location: [facility];  Service: Pulmonary;;   BRONCHIAL NEEDLE ASPIRATION BIOPSY  10/23/2021   Procedure: BRONCHIAL NEEDLE ASPIRATION BIOPSIES;  Surgeon: Josephine Igo, DO;  Location: [facility];  Service: Pulmonary;;   carpel tunnel surgery Right 2006   COLONOSCOPY  2014   COLOSTOMY  2007   colostomy let down  2008   endoscopy  2014   FIDUCIAL MARKER PLACEMENT  10/23/2021   Procedure: FIDUCIAL MARKER PLACEMENT;  Surgeon: Josephine Igo, DO;  Location: [facility];  Service: Pulmonary;;   rotator cuff tear Left 04/14/2020   TUBAL LIGATION     VIDEO BRONCHOSCOPY WITH ENDOBRONCHIAL NAVIGATION Right 10/23/2021   Procedure: VIDEO BRONCHOSCOPY WITH ENDOBRONCHIAL NAVIGATION;  Surgeon: Josephine Igo, DO;  Location: [facility];  Service: Pulmonary;  Laterality: Right;  ION, w/ fiducial placement   VIDEO BRONCHOSCOPY WITH RADIAL ENDOBRONCHIAL ULTRASOUND  10/23/2021   Procedure: VIDEO BRONCHOSCOPY WITH RADIAL ENDOBRONCHIAL ULTRASOUND;  Surgeon: Josephine Igo, DO;  Location: [facility];  Service: Pulmonary;;   Family History  Problem Relation Age of Onset   Heart murmur Mother    Heart attack Father    Diabetes Father    Diabetes Brother    Stroke Brother    Vision loss Brother    Social History   Socioeconomic History   Marital status: Married    Spouse name: Not on file   Number of children: Not on file   Years of education: Not on file   Highest education level: Associate degree: occupational, Scientist, product/process development, or vocational program  Occupational History   Occupation: Retired    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  Tobacco Use   Smoking status: Former    Current packs/day: 0.00    Average packs/day: 0.5 packs/day for 42.0 years (21.0 ttl pk-yrs)    Types: Cigarettes    Start date: 01/30/1967    Quit date: 01/30/2006    Years since quitting: 18.1   Smokeless tobacco: Never   Tobacco comments:     intermittent cycles b/w 1/4-1/2ppd  Vaping Use   Vaping status: Never Used  Substance and Sexual Activity   Alcohol use: Not Currently    Comment: 1 drink a year, maybe   Drug use: No   Sexual activity: Not Currently    Birth control/protection: Abstinence, None  Other Topics Concern   Not on file  Social History Narrative   Not on file   Social Drivers of Health   Financial Resource Strain: Low Risk  (03/17/2024)   Overall Financial Resource Strain (CARDIA)    Difficulty of Paying Living Expenses: Not hard at all  Food Insecurity: No Food Insecurity (03/17/2024)   Hunger Vital Sign    Worried About Running Out  of Food in the Last Year: Never true    Ran Out of Food in the Last Year: Never true  Transportation Needs: No Transportation Needs (03/17/2024)   PRAPARE - Administrator, Civil Service (Medical): No    Lack of Transportation (Non-Medical): No  Physical Activity: Inactive (03/17/2024)   Exercise Vital Sign    Days of Exercise per Week: 0 days    Minutes of Exercise per Session: 0 min  Stress: No Stress Concern Present (03/17/2024)   [employer] of Occupational Health - Occupational Stress Questionnaire    Feeling of Stress : Only a little  Social Connections: Moderately Integrated (03/17/2024)   Social Connection and Isolation Panel [NHANES]    Frequency of Communication with Friends and Family: More than three times a week    Frequency of Social Gatherings with Friends and Family: Once a week    Attends Religious Services: More than 4 times per year    Active Member of [employer] or Organizations: No    Attends Engineer, structural: Never    Marital Status: Married    Tobacco Counseling Counseling given: Not Answered Tobacco comments: intermittent cycles b/w 1/4-1/2ppd    Clinical Intake:  Pre-visit preparation completed: Yes  Pain : No/denies pain     Nutritional Status: BMI 25 -29 Overweight Nutritional Risks: None Diabetes:  Yes CBG done?: No Did pt. bring in CBG monitor from home?: No  Lab Results  Component Value Date   HGBA1C 6.3 (H) 02/18/2024   HGBA1C 6.2 (H) 10/20/2023   HGBA1C 5.7 (H) 06/16/2023     How often do you need to have someone help you when you read instructions, pamphlets, or other written materials from your doctor or pharmacy?: 1 - Never  Interpreter Needed?: No  Information entered by :: NAllen LPN   Activities of Daily Living     03/17/2024    9:04 AM 03/13/2024    5:08 PM  In your present state of health, do you have any difficulty performing the following activities:  Hearing? 0 0  Vision? 0 0  Difficulty concentrating or making decisions? 0 0  Walking or climbing stairs? 0 0  Dressing or bathing? 0 0  Doing errands, shopping? 0 0  Preparing Food and eating ? N N  Using the Toilet? N N  In the past six months, have you accidently leaked urine? N N  Do you have problems with loss of bowel control? N N  Managing your Medications? N N  Managing your Finances? N N  Housekeeping or managing your Housekeeping? N N    Patient Care Team: Dorothyann Peng, MD as PCP - General (Internal Medicine) Harlan Stains, [facility] (Inactive) (Pharmacist) [facility]  Indicate any recent Medical Services you may have received from other than [facility] providers in the past year (date may be approximate).     Assessment:   This is a routine wellness examination for Milanni.  Hearing/Vision screen Hearing Screening - Comments:: Denies hearing issues Vision Screening - Comments:: Regular eye exams   Goals Addressed             This Visit's Progress    Patient Stated       03/17/2024, denies goals       Depression Screen     03/17/2024    9:10 AM 02/18/2024   11:26 AM 10/20/2023   11:13 AM 03/05/2023    9:56 AM 02/12/2023    3:20 PM 10/03/2022  3:26 PM 09/12/2021    2:21 PM  PHQ 2/9 Scores  PHQ - 2 Score 3 1 0 0 0 0 0  PHQ- 9 Score 3 2 0        Fall Risk      03/13/2024    5:08 PM 02/18/2024   11:26 AM 03/05/2023    9:55 AM 02/12/2023    3:19 PM 02/07/2023   10:51 AM  Fall Risk   Falls in the past year? 0 0 0 0 0  Number falls in past yr: 0 0 0 0 0  Injury with Fall? 0 0 0 0 0  Risk for fall due to : Medication side effect No Fall Risks Medication side effect No Fall Risks No Fall Risks  Follow up Falls prevention discussed;Falls evaluation completed Falls evaluation completed Falls prevention discussed;Education provided;Falls evaluation completed Falls evaluation completed Falls evaluation completed;Education provided    MEDICARE RISK AT HOME:  Medicare Risk at Home Any stairs in or around the home?: (Patient-Rptd) Yes If so, are there any without handrails?: (Patient-Rptd) Yes Home free of loose throw rugs in walkways, pet beds, electrical cords, etc?: (Patient-Rptd) Yes Adequate lighting in your home to reduce risk of falls?: (Patient-Rptd) Yes Life alert?: (Patient-Rptd) No Use of a cane, walker or w/c?: (Patient-Rptd) No Grab bars in the bathroom?: (Patient-Rptd) No Shower chair or bench in shower?: (Patient-Rptd) No Elevated toilet seat or a handicapped toilet?: (Patient-Rptd) No  TIMED UP AND GO:  Was the test performed?  Yes  Length of time to ambulate 10 feet: 5 sec Gait steady and fast without use of assistive device  Cognitive Function: 6CIT completed        03/17/2024    9:11 AM 03/05/2023    9:57 AM 09/12/2021    2:23 PM 09/06/2020   10:46 AM 09/01/2019    9:32 AM  6CIT Screen  What Year? 0 points 0 points 0 points 0 points 0 points  What month? 0 points 0 points 0 points 0 points 0 points  What time? 0 points 0 points 0 points 0 points 0 points  Count back from 20 0 points 0 points 0 points 0 points 0 points  Months in reverse 0 points 0 points 0 points 0 points 0 points  Repeat phrase 0 points 2 points 2 points 0 points 0 points  Total Score 0 points 2 points 2 points 0 points 0 points     Immunizations Immunization History  Administered Date(s) Administered   Fluad Quad(high Dose 65+) 09/06/2020, 09/12/2021, 10/03/2022   Influenza Split 09/29/2013, 09/30/2015   Influenza Whole 08/30/2016   Influenza, High Dose Seasonal PF 09/29/2017, 09/14/2018, 09/01/2019   Influenza,inj,Quad PF,6+ Mos 09/12/2014   Influenza-Unspecified 09/29/2013, 09/12/2014, 09/30/2015, 09/29/2017, 09/14/2018, 09/12/2023   PFIZER(Purple Top)SARS-COV-2 Vaccination 01/19/2020, 02/09/2020, 12/21/2020, 05/16/2022, 09/12/2023   Pneumococcal Conjugate-13 01/06/2020   Pneumococcal Polysaccharide-23 10/07/2013   Pneumococcal-Unspecified 10/05/2013   Tdap 06/07/2019, 06/09/2019   Zoster Recombinant(Shingrix) 07/26/2020, 10/09/2020    Screening Tests Health Maintenance  Topic Date Due   COVID-19 Vaccine (6 - 2024-25 season) 11/07/2023   OPHTHALMOLOGY EXAM  05/11/2024   HEMOGLOBIN A1C  08/17/2024   Diabetic kidney evaluation - eGFR measurement  02/17/2025   Diabetic kidney evaluation - Urine ACR  02/17/2025   FOOT EXAM  02/17/2025   MAMMOGRAM  03/08/2025   Medicare Annual Wellness (AWV)  03/17/2025   DTaP/Tdap/Td (3 - Td or Tdap) 06/08/2029   Pneumonia Vaccine 55+ Years old  Completed   INFLUENZA  VACCINE  Completed   DEXA SCAN  Completed   Hepatitis C Screening  Completed   Zoster Vaccines- Shingrix  Completed   HPV VACCINES  Aged Out   Colonoscopy  Discontinued    Health Maintenance  Health Maintenance Due  Topic Date Due   COVID-19 Vaccine (6 - 2024-25 season) 11/07/2023   Health Maintenance Items Addressed: Up to date  Additional Screening:  Vision Screening: Recommended annual ophthalmology exams for early detection of glaucoma and other disorders of the eye.  Dental Screening: Recommended annual dental exams for proper oral hygiene  Community Resource Referral / Chronic Care Management: CRR required this visit?  No   CCM required this visit?  No     Plan:     I have  personally reviewed and noted the following in the patient's chart:   Medical and social history Use of alcohol, tobacco or illicit drugs  Current medications and supplements including opioid prescriptions. Patient is not currently taking opioid prescriptions. Functional ability and status Nutritional status Physical activity Advanced directives List of other physicians Hospitalizations, surgeries, and ER visits in previous 12 months Vitals Screenings to include cognitive, depression, and falls Referrals and appointments  In addition, I have reviewed and discussed with patient certain preventive protocols, quality metrics, and best practice recommendations. A written personalized care plan for preventive services as well as general preventive health recommendations were provided to patient.     Barb Merino, LPN   01/17/1477   After Visit Summary: (In Person-Printed) AVS printed and given to the patient  Notes: Nothing significant to report at this time.

## 2024-03-17 NOTE — Patient Instructions (Signed)
 Ms. [name] , Thank you for taking time to come for your Medicare Wellness Visit. I appreciate your ongoing commitment to your health goals. Please review the following plan we discussed and let me know if I can assist you in the future.   Referrals/Orders/Follow-Ups/Clinician Recommendations: none  This is a list of the screening recommended for you and due dates:  Health Maintenance  Topic Date Due   COVID-19 Vaccine (6 - 2024-25 season) 11/07/2023   Eye exam for diabetics  05/11/2024   Hemoglobin A1C  08/17/2024   Yearly kidney function blood test for diabetes  02/17/2025   Yearly kidney health urinalysis for diabetes  02/17/2025   Complete foot exam   02/17/2025   Mammogram  03/08/2025   Medicare Annual Wellness Visit  03/17/2025   DTaP/Tdap/Td vaccine (3 - Td or Tdap) 06/08/2029   Pneumonia Vaccine  Completed   Flu Shot  Completed   DEXA scan (bone density measurement)  Completed   Hepatitis C Screening  Completed   Zoster (Shingles) Vaccine  Completed   HPV Vaccine  Aged Out   Colon Cancer Screening  Discontinued    Advanced directives: (Copy Requested) Please bring a copy of your health care power of attorney and living will to the office to be added to your chart at your convenience. You can mail to [facility] [address] [place], [place] [address] or email to [email or web address]  Next Medicare Annual Wellness Visit scheduled for next year: Yes  insert Preventive Care attachment Insert FALL PREVENTION attachment if needed

## 2024-03-17 NOTE — Patient Instructions (Signed)
 Plan A = Automatic = Always=   Breztri Take 2 puffs first thing in am and then another 2 puffs about 12 hours later.  Gabapentin 300 mg four times a daily    Work on inhaler technique:  relax and gently blow all the way out then take a nice smooth full deep breath back in, triggering the inhaler at same time you start breathing in.  Hold for up to 5 seconds if you can. Blow out thru nose. Rinse and gargle with water when done.  If mouth or throat bother you at all,  try brushing teeth/gums/tongue with arm and hammer toothpaste/ make a slurry and gargle and spit out.   Plan B = Backup (to supplement plan A, not to replace it) (unless cough is the issue in which case more on to plan C) Only use your albuterol inhaler as a rescue medication to be used if you can't catch your breath by resting or doing a relaxed purse lip breathing pattern.  - The less you use it, the better it will work when you need it. - Ok to use the inhaler up to 2 puffs  every 4 hours if you must but call for appointment if use goes up over your usual need - Don't leave home without it !!  (think of it like spare time  for your car)   Plan C = Crisis (instead of Plan B but only if Plan B stops working) - only use your albuterol nebulizer (ok to use up duoneb which has ipatrpium) if you first try Plan B and it fails to help > ok to use the nebulizer up to every 4 hours but if start needing it regularly call for immediate appointment   Plan D = Deltasone 10 mg  if  ABC not helping  Prednisone 10 mg 2 until better then one daily x 5 d and stop  For cough > mucinex dm 1200 mg every 12 hours as needed as use flutter valve with oxycodone supplement     Please schedule a follow up visit in 2 months but call sooner if needed

## 2024-03-18 NOTE — Assessment & Plan Note (Signed)
 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 - try off cozar 11/03/2014 >>> no better  - neurontin 100 tid rx 11/30/14 > improved 01/03/2015 > changed to lyrica 04/2015 but not taking consistently as of 01/30/2016  - sinus CT 02/17/15 > neg  - FENO  04/14/17 =  14  - rechallenged with gabapentin 300 qid 07/22/2018 and if not better > refer to Dr Delford Field at [facility] > did not go, referred again 01/07/2019 > see eval 03/02/2019 rec speech therapy/tramadol (pt refused latter)   - flare early Sep 2021 off gabapentin and on breztri > changed back to gabapentin / symb 80 2bid > preferred breztri  - 11/21/2020 rec increase gabapentin to 300 qid to eliminate need for tramadol  - 12/28/2020 acute breakthru despite following instructions x for bed blocks > rx oxy 5 mg q 4 hr prn - HRCT  08/23/21  1. No findings to suggest interstitial lung disease.    Rec Short cyclies of oxycodone for refractory cough as already confirmed today to be on every other mode (x for flutter, which she did not bring but promised to next time)   Discussed in detail all the  indications, usual  risks and alternatives  relative to the benefits with patient who agrees to proceed with Rx as outlined.             Each maintenance medication was reviewed in detail including emphasizing most importantly the difference between maintenance and prns and under what circumstances the prns are to be triggered using an action plan format where appropriate.  Total time for H and P, chart review, counseling, reviewing hfa/ neb/flutter  device(s) and generating customized AVS unique to this office visit / same day charting = 32 min

## 2024-03-18 NOTE — Assessment & Plan Note (Addendum)
 Quit smoking  2007  with technically GOLD 2 criteria 2015  - 03/17/2024  After extensive coaching inhaler device,  effectiveness =    85% > resume  breztri   Flared off breztri  and just on duoneb (changed because of refractory cough, which did improve off breztri)   Rec Control AB with breztri and approp saba See cough a/p   ABCD action plan reviewed with plan D = prednisone 10 mg x 2 until better then one daily x 5 days and stop

## 2024-03-19 NOTE — Telephone Encounter (Signed)
 NFN

## 2024-03-30 ENCOUNTER — Encounter: Payer: Self-pay | Admitting: Internal Medicine

## 2024-03-31 ENCOUNTER — Telehealth: Payer: Self-pay | Admitting: Pharmacist

## 2024-03-31 DIAGNOSIS — E1165 Type 2 diabetes mellitus with hyperglycemia: Secondary | ICD-10-CM

## 2024-03-31 NOTE — Progress Notes (Signed)
   03/31/2024  Patient ID: [name], female   DOB: 05-01-1946, 78 y.o.   MRN: [id number]  Patient called the office and brought some Novo Nordisk Patient assistance forms to the office. The forms were left on my desk. The documentation from her phone call to the office said that she was bringing both the provider and patient portions of the application.   Signature was obtained from Dr. Allyne Gee and the forms were faxed to Star Age, CPhT to complete the process.  Plan: Follow up with Patient next week.  Beecher Mcardle, PharmD, BCACP Clinical Pharmacist [phone]

## 2024-04-01 ENCOUNTER — Telehealth: Payer: Self-pay

## 2024-04-01 ENCOUNTER — Telehealth: Payer: Self-pay | Admitting: *Deleted

## 2024-04-01 NOTE — Telephone Encounter (Signed)
 CALLED PATIENT TO INFORM OF CT FOR 04-08-24- ARRIVAL TIME- 4:30 PM @ [facility], NO RESTRICTIONS TO SCAN, PATIENT TO RECEIVE RESULTS FROM DR. KINARD ON 04/12/24 @ 11:30 AM, LVM FOR A RETURN CALL

## 2024-04-01 NOTE — Telephone Encounter (Signed)
 PAP: Application for Ozempic has been submitted to [employer], via fax

## 2024-04-02 ENCOUNTER — Telehealth: Payer: Self-pay | Admitting: *Deleted

## 2024-04-02 NOTE — Telephone Encounter (Signed)
 RETURNED PATIENT'S PHONE CALL, LVM FOR A RETURN CALL

## 2024-04-06 ENCOUNTER — Encounter: Payer: Self-pay | Admitting: Internal Medicine

## 2024-04-06 NOTE — Progress Notes (Signed)
 Pharmacy Medication Assistance Program Note    04/06/2024  Patient ID: [name], female   DOB: 1946/11/02, 78 y.o.   MRN: [id number]     04/06/2024  Outreach Medication One  Initial Outreach Date (Medication One) 04/01/2024  Manufacturer Medication One [employer] [facility] Ozempic  Dose of Ozempic 1 mg  Type of Radiographer, therapeutic Assistance  Date Application Sent to Patient 04/01/2024  Application Items Requested Application  Date Application Sent to Prescriber 04/01/2024  Name of Prescriber Dorothyann Peng  Application Items Received From Patient Application  Date Application Received From Provider 04/02/2024  Date Application Submitted to Manufacturer 04/02/2024  Method Application Sent to Manufacturer Fax  Patient Assistance Determination Approved  Approval Start Date 04/06/2024  Approval End Date 12/29/2024  Patient Notification Method MyChart

## 2024-04-06 NOTE — Telephone Encounter (Signed)
 PAP: Patient assistance application for Ozempic has been approved by PAP Companies: NovoNordisk from 04/06/2024 to 12/29/2024. Medication should be delivered to PAP Delivery: Provider's office. For further shipping updates, please [employer] at [phone]. Patient ID is: not provided

## 2024-04-08 ENCOUNTER — Ambulatory Visit (HOSPITAL_COMMUNITY)
Admission: RE | Admit: 2024-04-08 | Discharge: 2024-04-08 | Disposition: A | Discharge status: Home / Self Care | Source: Ambulatory Visit | Attending: Radiology | Admitting: Radiology

## 2024-04-08 DIAGNOSIS — C3491 Malignant neoplasm of unspecified part of right bronchus or lung: Secondary | ICD-10-CM | POA: Diagnosis not present

## 2024-04-08 DIAGNOSIS — J432 Centrilobular emphysema: Secondary | ICD-10-CM | POA: Diagnosis not present

## 2024-04-08 DIAGNOSIS — R590 Localized enlarged lymph nodes: Secondary | ICD-10-CM | POA: Diagnosis not present

## 2024-04-08 DIAGNOSIS — I700 Atherosclerosis of aorta: Secondary | ICD-10-CM | POA: Diagnosis not present

## 2024-04-08 DIAGNOSIS — C3490 Malignant neoplasm of unspecified part of unspecified bronchus or lung: Secondary | ICD-10-CM | POA: Diagnosis not present

## 2024-04-08 LAB — POCT I-STAT CREATININE: Creatinine, Ser: 0.9 mg/dL (ref 0.44–1.00)

## 2024-04-08 MED ORDER — IOHEXOL 300 MG/ML  SOLN
75.0000 mL | Freq: Once | INTRAMUSCULAR | Status: AC | PRN
  Administered 2024-04-08: 75 mL via INTRAVENOUS

## 2024-04-11 NOTE — Progress Notes (Signed)
 Radiation Oncology         [phone] ________________________________  Name: [name] MRN: [id number]  Date: 04/12/2024  DOB: 05/08/1946  Follow-Up Visit Note  CC: Cleave Curling, MD  Prudy Brownie, DO  No diagnosis found.  Diagnosis: Adenocarcinoma - RUL pulmonary nodule, clinical stage I   Interval Since Last Radiation: 2 years, 4 months, and 1 day   Intent: Curative  Radiation Treatment Dates: 12/04/2021 through 12/11/2021 Site Technique Total Dose (Gy) Dose per Fx (Gy) Completed Fx Beam Energies  Lung, Right: Lung_Rt IMRT 54/54 18 3/3 6XFFF    Narrative:  The patient returns today for routine follow-up and to review recent imaging. She was last see here for follow-up on 10/09/23.    Since her last visit, she has continued to follow with pulmonology, Dr. Waymond Hailey, and has had fairly persistent issues with a lingering cough and dyspnea. During her most recent visit with Dr. Waymond Hailey on 03/17/24, she was noted to report stopping breztri and progressive dyspnea. She also characterized her cough as productive of thick beige colored sputum. She was advised to resume her breztri and was prescribed a course of prednisone.           She has also continued to follow with cardiology and had a cardiac/coronary CT performed on 01/08/24 which showed a coronary calcium score of 1280, a total plaque volume of 1064 mm3, and evidence of non obstructive CAD. A chest over read was ordered on this study which also demonstrated stable right hilar adenopathy, and stability of the 3 mm RML nodule.        Her most recent chest CT performed on 04/08/24 also demonstrated stable findings overall, with no new or progressive findings to suggest recurrent or metastatic disease. CT findings also included: stable post treatment scarring in the suprahilar posterior right lung, stable soft tissue fullness in the upper right hilar region, and stability to a slight decrease in size of the right hilar  lymphadenopathy.         Other pertinent imaging performed in the interval includes a bilateral screening mammogram on 03/08/24 which demonstrated no evidence of malignancy in either breast.   ***          Allergies:  is allergic to other.  Meds: Current Outpatient Medications  Medication Sig Dispense Refill   albuterol (PROVENTIL) (2.5 MG/3ML) 0.083% nebulizer solution Take 3 mLs (2.5 mg total) by nebulization every 4 (four) hours as needed. 75 mL 12   albuterol (VENTOLIN HFA) 108 (90 Base) MCG/ACT inhaler INHALE 2 PUFFS INTO THE LUNGS EVERY 4 HOURS AS NEEDED FOR WHEEZING OR SHORTNESS OF BREATH 18 g 12   aspirin 81 MG tablet Take 81 mg by mouth daily.     atorvastatin (LIPITOR) 80 MG tablet TAKE 1 TABLET BY MOUTH DAILY ON MONDAY-FRIDAY. 90 tablet 1   Biotin 40981 MCG TBDP Take 10,000 mcg by mouth daily.     bisoprolol (ZEBETA) 5 MG tablet TAKE 1/2 TABLET BY MOUTH EVERY DAY 45 tablet 3   Budeson-Glycopyrrol-Formoterol (BREZTRI AEROSPHERE) 160-9-4.8 MCG/ACT AERO Inhale 2 puffs into the lungs 2 (two) times daily. 10.7 g 6   CALCIUM-MAGNESIUM-ZINC PO Take 1 capsule by mouth at bedtime.     Cholecalciferol (VITAMIN D3) 2000 UNITS TABS Take 2,000 Units by mouth at bedtime.     DULoxetine (CYMBALTA) 60 MG capsule TAKE 1 CAPSULE(60 MG) BY MOUTH DAILY 90 capsule 1   Evolocumab (REPATHA SURECLICK) 140 MG/ML SOAJ Inject 140 mg into the  skin every 14 (fourteen) days. 6 mL 3   famotidine (PEPCID) 20 MG tablet Take 1 tablet (20 mg total) by mouth daily after supper. 90 tablet 3   glucose blood (ACCU-CHEK GUIDE) test strip 1 each by Other route daily. Use as instructed to check blood sugars 1 time per day dx: e11.22 50 each 11   Magnesium 250 MG TABS Take 1 tablet by mouth daily.     montelukast (SINGULAIR) 10 MG tablet TAKE 1 TABLET(10 MG) BY MOUTH AT BEDTIME 90 tablet 3   oxyCODONE-acetaminophen (PERCOCET) 10-325 MG tablet Take 1 tablet by mouth every 4 (four) hours as needed. 30 tablet 0    pantoprazole (PROTONIX) 40 MG tablet TAKE 1 TABLET BY MOUTH 30 MINUTES BEFORE FIRST MEAL OF THE DAY 90 tablet 2   predniSONE (DELTASONE) 10 MG tablet 2 daily  until better, then 1 daily x 5 days and stop 100 tablet 0   pregabalin (LYRICA) 100 MG capsule Take 1 capsule (100 mg total) by mouth 3 (three) times daily. 90 capsule 3   Semaglutide, 1 MG/DOSE, (OZEMPIC, 1 MG/DOSE,) 4 MG/3ML SOPN Inject 1 mg into the skin once a week. Friday 3 mL 3   No current facility-administered medications for this encounter.    Physical Findings: The patient is in no acute distress. Patient is alert and oriented.  vitals were not taken for this visit. .  No significant changes. Lungs are clear to auscultation bilaterally. Heart has regular rate and rhythm. No palpable cervical, supraclavicular, or axillary adenopathy. Abdomen soft, non-tender, normal bowel sounds.   Lab Findings: Lab Results  Component Value Date   WBC 4.0 02/18/2024   HGB 14.0 02/18/2024   HCT 42.4 02/18/2024   MCV 91 02/18/2024   PLT 341 02/18/2024    Radiographic Findings: CT Chest W Contrast Result Date: 04/10/2024 CLINICAL DATA:  Non-small-cell lung cancer. Restaging. * Tracking Code: BO * EXAM: CT CHEST WITH CONTRAST TECHNIQUE: Multidetector CT imaging of the chest was performed during intravenous contrast administration. RADIATION DOSE REDUCTION: This exam was performed according to the departmental dose-optimization program which includes automated exposure control, adjustment of the mA and/or kV according to patient size and/or use of iterative reconstruction technique. CONTRAST:  75mL OMNIPAQUE IOHEXOL 300 MG/ML  SOLN COMPARISON:  10/07/2023 FINDINGS: Cardiovascular: The heart size is normal. No substantial pericardial effusion. Coronary artery calcification is evident. Mild atherosclerotic calcification is noted in the wall of the thoracic aorta. Mediastinum/Nodes: No mediastinal lymphadenopathy. Soft tissue fullness in the upper  right hilar region is similar. Previously measured 14 mm short axis upper right hilar node is similar at 13 mm short axis today on 55/2. Inferior index right hilar node measured previously at 12 mm is 10 mm today on 67/2. The esophagus has normal imaging features. There is no axillary lymphadenopathy. Lungs/Pleura: Centrilobular and paraseptal emphysema evident. Post treatment scarring in the suprahilar posterior right lung is stable. Tiny posterior right perifissural pulmonary nodule on 74/6 is stable. Stable scarring at the left base. No new suspicious pulmonary nodule or mass. No pleural effusion. 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 Musculoskeletal: No worrisome lytic or sclerotic osseous abnormality. IMPRESSION: 1. Stable exam. No new or progressive findings to suggest recurrent or metastatic disease. 2. Stable post treatment scarring in the suprahilar posterior right lung. 3. Stable soft tissue fullness in the upper right hilar region with stable to slightly decreased size of right hilar lymphadenopathy. 4.  Emphysema (ICD10-J43.9) and  Aortic Atherosclerosis (ICD10-170.0) Electronically Signed   By: Donnal Fusi M.D.   On: 04/10/2024 09:24    Impression: Adenocarcinoma - RUL pulmonary nodule, clinical stage I   The patient is recovering from the effects of radiation.  ***  Plan:  ***   *** minutes of total time was spent for this patient encounter, including preparation, face-to-face counseling with the patient and coordination of care, physical exam, and documentation of the encounter. ____________________________________  Noralee Beam, PhD, MD  This document serves as a record of services personally performed by Retta Caster, MD. It was created on his behalf by Aleta Anda, a trained medical scribe. The creation of this record is based on the scribe's personal observations and the provider's statements to them. This document  has been checked and approved by the attending provider.

## 2024-04-12 ENCOUNTER — Telehealth: Payer: Self-pay | Admitting: Pharmacist

## 2024-04-12 ENCOUNTER — Ambulatory Visit
Admission: RE | Admit: 2024-04-12 | Discharge: 2024-04-12 | Disposition: A | Discharge status: Home / Self Care | Payer: Self-pay | Source: Ambulatory Visit | Attending: Radiation Oncology | Admitting: Radiation Oncology

## 2024-04-12 ENCOUNTER — Encounter: Payer: Self-pay | Admitting: Radiation Oncology

## 2024-04-12 VITALS — BP 125/69 | HR 93 | Temp 97.2°F | Resp 18 | Ht 66.0 in | Wt 177.8 lb

## 2024-04-12 DIAGNOSIS — Z79899 Other long term (current) drug therapy: Secondary | ICD-10-CM | POA: Diagnosis not present

## 2024-04-12 DIAGNOSIS — Z7982 Long term (current) use of aspirin: Secondary | ICD-10-CM | POA: Diagnosis not present

## 2024-04-12 DIAGNOSIS — C3491 Malignant neoplasm of unspecified part of right bronchus or lung: Secondary | ICD-10-CM

## 2024-04-12 DIAGNOSIS — C3411 Malignant neoplasm of upper lobe, right bronchus or lung: Secondary | ICD-10-CM | POA: Diagnosis not present

## 2024-04-12 DIAGNOSIS — Z85118 Personal history of other malignant neoplasm of bronchus and lung: Secondary | ICD-10-CM | POA: Insufficient documentation

## 2024-04-12 DIAGNOSIS — K5730 Diverticulosis of large intestine without perforation or abscess without bleeding: Secondary | ICD-10-CM | POA: Insufficient documentation

## 2024-04-12 DIAGNOSIS — Z7952 Long term (current) use of systemic steroids: Secondary | ICD-10-CM | POA: Diagnosis not present

## 2024-04-12 DIAGNOSIS — J432 Centrilobular emphysema: Secondary | ICD-10-CM | POA: Diagnosis not present

## 2024-04-12 DIAGNOSIS — E1165 Type 2 diabetes mellitus with hyperglycemia: Secondary | ICD-10-CM

## 2024-04-12 DIAGNOSIS — Z923 Personal history of irradiation: Secondary | ICD-10-CM | POA: Diagnosis not present

## 2024-04-12 DIAGNOSIS — I700 Atherosclerosis of aorta: Secondary | ICD-10-CM | POA: Insufficient documentation

## 2024-04-12 DIAGNOSIS — Z87891 Personal history of nicotine dependence: Secondary | ICD-10-CM | POA: Diagnosis not present

## 2024-04-12 NOTE — Progress Notes (Signed)
 [name] is here today for follow up post radiation to the lung.  Lung Side: Right  Side  Does the patient complain of any of the following: Pain:Denies Shortness of breath w/wo exertion: Denies Cough: Yes Hemoptysis: Denies Pain with swallowing: Denies Swallowing/choking concerns: Denies Appetite: Fair Energy Level: Low Post radiation skin Changes: Yes, she reports having rash like areas on her back. Not currently.   BP 125/69 (BP Location: Right Arm, Patient Position: Sitting, Cuff Size: Normal)   Pulse 93   Temp (!) 97.2 F (36.2 C)   Resp 18   Ht 5\' 6"  (1.676 m)   Wt 177 lb 12.8 oz (80.6 kg)   SpO2 100%   BMI 28.70 kg/m

## 2024-04-12 NOTE — Addendum Note (Signed)
 Encounter addended by: Pearlene Bouchard, PA-C on: 04/12/2024 2:57 PM  Actions taken: Follow-up modified

## 2024-04-12 NOTE — Progress Notes (Signed)
   04/12/2024  Patient ID: [name], female   DOB: 03-Jan-1946, 78 y.o.   MRN: [id number]  Patient was called to complete a medication review and to follow up on medication assistance.  She has been approved for Ozempic through [employer]'s Program.  Unfortunately, she did not answer the phone. HIPAA compliant message was left on her voicemail.\  Geronimo Krabbe, PharmD, BCACP Clinical Pharmacist [phone]

## 2024-04-12 NOTE — Telephone Encounter (Signed)
-----   Message from Sung Engels sent at 03/31/2024  6:03 PM EDT ----- Regarding: FW: follow up  ----- Message ----- From: Geronimo Krabbe, [facility] Sent: 03/31/2024   6:02 PM EDT To: Geronimo Krabbe, RPH Subject: follow up                                      Patient assistance forms were brought to the office and left here. I had dr. Elnita Hai sign them and they were faxed to [facility]

## 2024-04-13 ENCOUNTER — Telehealth: Payer: Self-pay | Admitting: Pharmacist

## 2024-04-13 DIAGNOSIS — E1165 Type 2 diabetes mellitus with hyperglycemia: Secondary | ICD-10-CM

## 2024-04-13 NOTE — Progress Notes (Signed)
 04/13/2024 Name: [name] MRN: [id number] DOB: [birth year]  Chief Complaint  Patient presents with   Medication Assistance    Ozempic    [name] is a 78 y.o. year old female who presented for a telephone visit.   They were referred to the pharmacist by  herself  for assistance in managing medication access.    Subjective:  Care Team: Primary Care Provider: Dorothyann Peng, MD ;   Cardiologist: Dr. Rennis Golden ; Next Scheduled Visit: 04/27/24  Medication Access/Adherence  Current Pharmacy:  [id number] [place], [place] - [address] AT [facility] [address] [facility] [address] [place] [address] Phone: [phone] Fax: [phone]  [facility] - [place], [place] - [address] [place] [address] Phone: [phone] Fax: [phone]  Patient reports affordability concerns with their medications: Yes  Med assistance with Ozempic and [facility] with Repatha Patient reports access/transportation concerns to their pharmacy: No  Patient reports adherence concerns with their medications:  No      Diabetes:  Current medications:  Ozempic 1 mg weekly   Patient denies hypoglycemic s/sx including  dizziness, shakiness, sweating. Patient denies hyperglycemic symptoms including .polyuria, polydipsia, polyphagia, nocturia, neuropathy, blurred vision.  A1c 6.3% (02/25)  Objective:  Lab Results  Component Value Date   HGBA1C 6.3 (H) 02/18/2024    Lab Results  Component Value Date   CREATININE 0.90 04/08/2024   BUN 8 02/18/2024   NA 136 02/18/2024   K 4.8 02/18/2024   CL 96 02/18/2024   CO2 28 02/18/2024    Lab Results  Component Value Date   CHOL 189 10/20/2023   HDL 65 10/20/2023   LDLCALC 99 10/20/2023   TRIG 144 10/20/2023   CHOLHDL 2.9 10/20/2023    Medications Reviewed Today     Reviewed by Beecher Mcardle, RPH (Pharmacist) on 04/13/24 at 1050  Med List Status: <None>    Medication Order Taking? Sig Documenting Provider Last Dose Status Informant  albuterol (PROVENTIL) (2.5 MG/3ML) 0.083% nebulizer solution [id number] Yes Take 3 mLs (2.5 mg total) by nebulization every 4 (four) hours as needed. [name], MD Taking Active   albuterol (VENTOLIN HFA) 108 (90 Base) MCG/ACT inhaler [id number] Yes INHALE 2 PUFFS INTO THE LUNGS EVERY 4 HOURS AS NEEDED FOR WHEEZING OR SHORTNESS OF Weyman Rodney, MD Taking Active   aspirin 81 MG tablet [id number] Yes Take 81 mg by mouth daily. [provider] Taking Active Self  atorvastatin (LIPITOR) 80 MG tablet [id number] Yes TAKE 1 TABLET BY MOUTH DAILY ON MONDAY-FRIDAY. Dorothyann Peng, MD Taking Active   Biotin 70623 MCG TBDP [id number] Yes Take 10,000 mcg by mouth daily. [provider] Taking Active Self  bisoprolol (ZEBETA) 5 MG tablet [id number] Yes TAKE 1/2 TABLET BY MOUTH EVERY DAY Dorothyann Peng, MD Taking Active   Budeson-Glycopyrrol-Formoterol (BREZTRI AEROSPHERE) 160-9-4.8 MCG/ACT Sandrea Matte [id number] Yes Inhale 2 puffs into the lungs 2 (two) times daily. Dorothyann Peng, MD Taking Active   CALCIUM-MAGNESIUM-ZINC PO [id number] Yes Take 1 capsule by mouth at bedtime. [provider] Taking Active Self  Cholecalciferol (VITAMIN D3) 2000 UNITS TABS [id number] Yes Take 2,000 Units by mouth at bedtime. [provider] Taking Active Self  DULoxetine (CYMBALTA) 60 MG capsule [id number] Yes TAKE 1 CAPSULE(60 MG) BY MOUTH DAILY Dorothyann Peng, MD Taking Active   Evolocumab [facility] SURECLICK) 140 MG/ML Ivory Broad [id number] Yes Inject 140 mg into  the skin every 14 (fourteen) days. Hazle Lites, MD Taking Active   famotidine (PEPCID) 20 MG tablet [id number] Yes Take 1 tablet (20 mg total) by mouth daily after supper. Margaretann Sharper, MD Taking Active   glucose blood (ACCU-CHEK GUIDE) test strip [id number] Yes 1 each by Other route daily. Use as instructed to check blood sugars 1 time per day dx: e11.22  Cleave Curling, MD Taking Active Self  Magnesium 250 MG TABS [id number] Yes Take 1 tablet by mouth daily. [provider] Taking Active Self  montelukast (SINGULAIR) 10 MG tablet [id number] Yes TAKE 1 TABLET(10 MG) BY MOUTH AT BEDTIME Olalere, Adewale A, MD Taking Active   oxyCODONE-acetaminophen (PERCOCET) 10-325 MG tablet [id number] Yes Take 1 tablet by mouth every 4 (four) hours as needed. Diamond Formica, MD Taking Active            Med Note Suszanne Eriksson, Koleen Perna   Tue Apr 13, 2024 10:49 AM) For cough  pantoprazole (PROTONIX) 40 MG tablet [id number] Yes TAKE 1 TABLET BY MOUTH 30 MINUTES BEFORE FIRST MEAL OF THE DAY Cleave Curling, MD Taking Active   predniSONE (DELTASONE) 10 MG tablet [id number] Yes 2 daily  until better, then 1 daily x 5 days and stop Wert, Michael B, MD Taking Active   pregabalin (LYRICA) 100 MG capsule [id number] Yes Take 1 capsule (100 mg total) by mouth 3 (three) times daily. Cleave Curling, MD Taking Active   Semaglutide, 1 MG/DOSE, (OZEMPIC, 1 MG/DOSE,) 4 MG/3ML SOPN [id number] Yes Inject 1 mg into the skin once a week. Friday Cleave Curling, MD Taking Active Self           Med Note Andee Kato, [facility]   Wed Dec 11, 2022 11:46 AM) Kent Pear every friday              Assessment/Plan:   Diabetes: - Currently controlled -On statin therapy (Atorvastatin 80 mg lf 03/09/24 and Repatha) - - Recommend to Continue current therapy   - Was recently approved to receive Ozempic through [employer] Patient Assistance Program. Medications will be delivered to the PCP office.   Follow Up Plan:    Follow up with the patient in 6 months to get prepared for re-enrollment.   Geronimo Krabbe, PharmD, BCACP Clinical Pharmacist [phone]

## 2024-04-14 ENCOUNTER — Other Ambulatory Visit: Payer: Self-pay | Admitting: Pharmacist

## 2024-04-14 ENCOUNTER — Encounter: Payer: Self-pay | Admitting: Pharmacist

## 2024-04-14 DIAGNOSIS — E1165 Type 2 diabetes mellitus with hyperglycemia: Secondary | ICD-10-CM

## 2024-04-14 NOTE — Progress Notes (Signed)
   04/14/2024  Patient ID: [name], female   DOB: 1946-11-30, 78 y.o.   MRN: [id number]  Patient came to clinic stating someone told her some Ozempic would be in the refrigerator for her with her name on it.  When the staff could not find it, they came and asked me. Patient is approved to receive her Ozempic through the [employer] Patient assistance program but her shipment has not come yet.   Dr. Elnita Hai provided the Patient with an Ozempic 0.5 mg sample with special administration instructions that I gave to the patient:  Lot [id number]  exp 07/29/26  She communicated understanding.  Geronimo Krabbe, PharmD, BCACP Clinical Pharmacist [phone]

## 2024-04-14 NOTE — Progress Notes (Signed)
error 

## 2024-04-23 DIAGNOSIS — E785 Hyperlipidemia, unspecified: Secondary | ICD-10-CM | POA: Diagnosis not present

## 2024-04-23 DIAGNOSIS — E782 Mixed hyperlipidemia: Secondary | ICD-10-CM | POA: Diagnosis not present

## 2024-04-26 LAB — NMR, LIPOPROFILE
Cholesterol, Total: 140 mg/dL (ref 100–199)
HDL Particle Number: 44.8 umol/L (ref >=30.5)
HDL-C: 73 mg/dL (ref >39)
LDL Particle Number: 641 nmol/L (ref <1000)
LDL Size: 20.6 nm (ref >20.5)
LDL-C (NIH Calc): 50 mg/dL (ref 0–99)
LP-IR Score: 34 (ref <=45)
Small LDL Particle Number: 376 nmol/L (ref <=527)
Triglycerides: 89 mg/dL (ref 0–149)

## 2024-04-26 LAB — LIPOPROTEIN A (LPA): Lipoprotein (a): 171.2 nmol/L — ABNORMAL HIGH (ref <75.0)

## 2024-04-27 ENCOUNTER — Encounter: Payer: Self-pay | Admitting: Internal Medicine

## 2024-04-27 ENCOUNTER — Ambulatory Visit: Payer: Medicare HMO | Attending: Internal Medicine | Admitting: Internal Medicine

## 2024-04-27 VITALS — BP 128/64 | HR 90 | Ht 66.0 in | Wt 175.2 lb

## 2024-04-27 DIAGNOSIS — R252 Cramp and spasm: Secondary | ICD-10-CM

## 2024-04-27 DIAGNOSIS — E1169 Type 2 diabetes mellitus with other specified complication: Secondary | ICD-10-CM | POA: Diagnosis not present

## 2024-04-27 DIAGNOSIS — E785 Hyperlipidemia, unspecified: Secondary | ICD-10-CM

## 2024-04-27 DIAGNOSIS — I2510 Atherosclerotic heart disease of native coronary artery without angina pectoris: Secondary | ICD-10-CM

## 2024-04-27 NOTE — Patient Instructions (Signed)
 Medication Instructions:  HOLD atorvastatin  for TWO WEEKS  Please provide an update on how you are feeling off the medication  *If you need a refill on your cardiac medications before your next appointment, please call your pharmacy*  Follow-Up: At [facility], you and your health needs are our priority.  As part of our continuing mission to provide you with exceptional heart care, our providers are all part of one team.  This team includes your primary Cardiologist (physician) and Advanced Practice Providers or APPs (Physician Assistants and Nurse Practitioners) who all work together to provide you with the care you need, when you need it.  Your next appointment:    12 months with Dr. Maximo Spar  We recommend signing up for the patient portal called "MyChart".  Sign up information is provided on this After Visit Summary.  MyChart is used to connect with patients for Virtual Visits (Telemedicine).  Patients are able to view lab/test results, encounter notes, upcoming appointments, etc.  Non-urgent messages can be sent to your provider as well.   To learn more about what you can do with MyChart, go to [email or web address].

## 2024-04-27 NOTE — Progress Notes (Signed)
 OFFICE CONSULT NOTE  Chief Complaint:  Follow-up  Primary Care Physician: Cleave Curling, MD  HPI:  [name] is a 78 y.o. female who is being seen today for the evaluation of dyslipidemia at the request of Cleave Curling, MD. this a pleasant 78 year old female kindly referred for evaluation management of dyslipidemia.  She has a history of type 2 diabetes, COPD, tobacco abuse, hypertension and lung cancer with radiation therapy.  She also has a history of TIA.  Her target LDL should be much lower than recent labs, likely less than 55 as she would be considered very high risk.  Recent labs showed her total cholesterol 189, triglycerides 144, HDL 65 and LDL 99.  This is on atorvastatin  80 mg daily.  Generally this is representative of her cholesterol profile is over the past several years.  She has been on Ozempic  with some work on weight loss.  She takes a low-dose aspirin  as well.  Of note I pointed out that she has had multiple chest CT scans for follow-up of her lung cancer which is demonstrated aortic atherosclerosis and more recently "severe coronary artery calcifications".  She reports that she has never been told that she has any coronary artery disease.  She denies any chest pain but does note some shortness of breath and fatigue.  She cannot tell whether this is related to her lung cancer or radiation therapy.  She reports a chronic nonproductive cough which she has had for years.  She was seen by Dr. Alroy Aspen and 2022 and underwent a Myoview  stress test which was negative for ischemia.  She had complained of chest pain at that time.  She also notes her father had an MI and died at age 62.  2024-05-23  [name] is seen today in follow-up.  She underwent coronary CT angiography for severe coronary artery calcification in January 2025.  This was prompted by worsening shortness of breath and fatigue which she says today has improved.  The CT scan showed extensive coronary  calcification with a calcium  score of 1280, 97 percentile for age and sex matched controls.  Total plaque volume was 1064 mm which is extensive and the majority of this is noncalcified plaque at 904 mm.  However overall there was nonobstructive coronary disease with up to mild stenosis in a ramus branch.  She is tolerating the addition of Repatha .  Recent lipid showed LDL particle #641, LDL 50, HDL 73 and triglycerides 89.  Small LDL particle #376.  LP(a) unfortunately has gone up to 171 nmol/L for unknown reasons.  She did report some cramping that she has been having more recently mostly in her legs but also sometimes in her rib cage when she bends over.  This improves with a teaspoon of vinegar.  PMHx:  Past Medical History:  Diagnosis Date   Allergy  09/30/1996   Arthritis    Asthma    Complication of anesthesia    COPD (chronic obstructive pulmonary disease) (HCC)    Diabetes (HCC)    High cholesterol    History of radiation therapy    Right Lung- 12/04/21-12/11/21- Dr. Retta Caster   Hypertension    Lung cancer [facility])    Neuromuscular disorder (HCC)    Pneumonia 09/10/2017   PONV (postoperative nausea and vomiting)    Stroke [facility]) 2007   mini strokes    Past Surgical History:  Procedure Laterality Date   ABDOMINAL HYSTERECTOMY  1995   BRONCHIAL BIOPSY  10/23/2021   Procedure:  BRONCHIAL BIOPSIES;  Surgeon: Prudy Brownie, DO;  Location: [facility];  Service: Pulmonary;;   BRONCHIAL BRUSHINGS  10/23/2021   Procedure: BRONCHIAL BRUSHINGS;  Surgeon: Prudy Brownie, DO;  Location: [facility];  Service: Pulmonary;;   BRONCHIAL NEEDLE ASPIRATION BIOPSY  10/23/2021   Procedure: BRONCHIAL NEEDLE ASPIRATION BIOPSIES;  Surgeon: Prudy Brownie, DO;  Location: [facility];  Service: Pulmonary;;   carpel tunnel surgery Right 2006   COLONOSCOPY  2014   COLOSTOMY  2007   colostomy let down  2008   endoscopy  2014   FIDUCIAL MARKER PLACEMENT  10/23/2021   Procedure: FIDUCIAL  MARKER PLACEMENT;  Surgeon: Prudy Brownie, DO;  Location: [facility];  Service: Pulmonary;;   rotator cuff tear Left 04/14/2020   TUBAL LIGATION     VIDEO BRONCHOSCOPY WITH ENDOBRONCHIAL NAVIGATION Right 10/23/2021   Procedure: VIDEO BRONCHOSCOPY WITH ENDOBRONCHIAL NAVIGATION;  Surgeon: Prudy Brownie, DO;  Location: [facility];  Service: Pulmonary;  Laterality: Right;  ION, w/ fiducial placement   VIDEO BRONCHOSCOPY WITH RADIAL ENDOBRONCHIAL ULTRASOUND  10/23/2021   Procedure: VIDEO BRONCHOSCOPY WITH RADIAL ENDOBRONCHIAL ULTRASOUND;  Surgeon: Prudy Brownie, DO;  Location: [facility];  Service: Pulmonary;;    FAMHx:  Family History  Problem Relation Age of Onset   Heart murmur Mother    Heart attack Father    Diabetes Father    Diabetes Brother    Stroke Brother    Vision loss Brother     SOCHx:   reports that she quit smoking about 18 years ago. Her smoking use included cigarettes. She started smoking about 57 years ago. She has a 21 pack-year smoking history. She has never used smokeless tobacco. She reports that she does not currently use alcohol. She reports that she does not use drugs.  ALLERGIES:  Allergies  Allergen Reactions   Other Swelling    HAIR DYE    ROS: Pertinent items noted in HPI and remainder of comprehensive ROS otherwise negative.  HOME MEDS: Current Outpatient Medications on File Prior to Visit  Medication Sig Dispense Refill   albuterol  (PROVENTIL ) (2.5 MG/3ML) 0.083% nebulizer solution Take 3 mLs (2.5 mg total) by nebulization every 4 (four) hours as needed. 75 mL 12   albuterol  (VENTOLIN  HFA) 108 (90 Base) MCG/ACT inhaler INHALE 2 PUFFS INTO THE LUNGS EVERY 4 HOURS AS NEEDED FOR WHEEZING OR SHORTNESS OF BREATH 18 g 12   aspirin  81 MG tablet Take 81 mg by mouth daily.     atorvastatin  (LIPITOR) 80 MG tablet TAKE 1 TABLET BY MOUTH DAILY ON MONDAY-FRIDAY. 90 tablet 1   Biotin 42595 MCG TBDP Take 10,000 mcg by mouth daily.     bisoprolol   (ZEBETA ) 5 MG tablet TAKE 1/2 TABLET BY MOUTH EVERY DAY 45 tablet 3   Budeson-Glycopyrrol-Formoterol  (BREZTRI  AEROSPHERE) 160-9-4.8 MCG/ACT AERO Inhale 2 puffs into the lungs 2 (two) times daily. 10.7 g 6   CALCIUM -MAGNESIUM -ZINC PO Take 1 capsule by mouth at bedtime.     Cholecalciferol (VITAMIN D3) 2000 UNITS TABS Take 2,000 Units by mouth at bedtime.     DULoxetine  (CYMBALTA ) 60 MG capsule TAKE 1 CAPSULE(60 MG) BY MOUTH DAILY 90 capsule 1   Evolocumab  (REPATHA  SURECLICK) 140 MG/ML SOAJ Inject 140 mg into the skin every 14 (fourteen) days. 6 mL 3   famotidine  (PEPCID ) 20 MG tablet Take 1 tablet (20 mg total) by mouth daily after supper. 90 tablet 3   glucose blood (ACCU-CHEK GUIDE) test strip 1 each by Other route  daily. Use as instructed to check blood sugars 1 time per day dx: e11.22 50 each 11   Magnesium  250 MG TABS Take 1 tablet by mouth daily.     montelukast  (SINGULAIR ) 10 MG tablet TAKE 1 TABLET(10 MG) BY MOUTH AT BEDTIME 90 tablet 3   pantoprazole  (PROTONIX ) 40 MG tablet TAKE 1 TABLET BY MOUTH 30 MINUTES BEFORE FIRST MEAL OF THE DAY 90 tablet 2   predniSONE  (DELTASONE ) 10 MG tablet 2 daily  until better, then 1 daily x 5 days and stop 100 tablet 0   pregabalin  (LYRICA ) 100 MG capsule Take 1 capsule (100 mg total) by mouth 3 (three) times daily. 90 capsule 3   Semaglutide , 1 MG/DOSE, (OZEMPIC , 1 MG/DOSE,) 4 MG/3ML SOPN Inject 1 mg into the skin once a week. Friday 3 mL 3   oxyCODONE -acetaminophen  (PERCOCET) 10-325 MG tablet Take 1 tablet by mouth every 4 (four) hours as needed. 30 tablet 0   No current facility-administered medications on file prior to visit.    LABS/IMAGING: No results found for this or any previous visit (from the past 48 hours). No results found.  LIPID PANEL:    Component Value Date/Time   CHOL 189 10/20/2023 1219   TRIG 144 10/20/2023 1219   HDL 65 10/20/2023 1219   CHOLHDL 2.9 10/20/2023 1219   LDLCALC 99 10/20/2023 1219    WEIGHTS: Wt Readings from  Last 3 Encounters:  04/27/24 175 lb 3.2 oz (79.5 kg)  04/12/24 177 lb 12.8 oz (80.6 kg)  03/17/24 173 lb 6.4 oz (78.7 kg)    VITALS: BP 128/64 (BP Location: Left Arm, Patient Position: Sitting, Cuff Size: Normal)   Pulse 90   Ht 5\' 6"  (1.676 m)   Wt 175 lb 3.2 oz (79.5 kg)   SpO2 93%   BMI 28.28 kg/m   EXAM: General appearance: alert and no distress Neck: no carotid bruit, no JVD, and thyroid  not enlarged, symmetric, no tenderness/mass/nodules Lungs: clear to auscultation bilaterally Heart: regular rate and rhythm, S1, S2 normal, no murmur, click, rub or gallop Abdomen: soft, non-tender; bowel sounds normal; no masses,  no organomegaly Extremities: extremities normal, atraumatic, no cyanosis or edema Pulses: 2+ and symmetric Skin: Skin color, texture, turgor normal. 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   Ms. Barrington seems to be doing well with regards to her lipids.  Although she has extensive coronary calcification and a lot of noncalcified plaque she had no significant stenosis.  She is having some cramping.  I advised her to do a 2-week statin holiday to see if it perhaps is the high-dose atorvastatin .  She seems to be tolerating Repatha .  Blood pressure is well-controlled today.  No changes to her medicines or suggestion at this time.  She will get back to me regarding her statin holiday and we could plan any changes according to that.    Follow-up otherwise annually or sooner as necessary.  Hazle Lites, MD, [facility], FNLA, FACP  [place]  [facility]  Medical Director of the Advanced Lipid Disorders &  Cardiovascular Risk Reduction  Clinic Diplomate of the American Board of Clinical Lipidology Attending Cardiologist  Direct Dial: [phone]  Fax: [phone]  Website:  [email or web address]   Aimee Houseman  C Tilden Broz 04/27/2024, 10:28 AM

## 2024-04-28 ENCOUNTER — Encounter: Payer: Self-pay | Admitting: Internal Medicine

## 2024-04-29 ENCOUNTER — Encounter: Payer: Self-pay | Admitting: Internal Medicine

## 2024-04-29 MED ORDER — OXYCODONE-ACETAMINOPHEN 10-325 MG PO TABS: 1.0000 | ORAL_TABLET | ORAL | 0 refills | Status: DC | PRN

## 2024-04-29 NOTE — Telephone Encounter (Signed)
 Needs to remember plan D on her action plan from [facility] = short course of prednisone    Will refill oxy x one  and needs ov before more

## 2024-05-03 ENCOUNTER — Encounter: Payer: Self-pay | Admitting: Internal Medicine

## 2024-05-17 DIAGNOSIS — H0288B Meibomian gland dysfunction left eye, upper and lower eyelids: Secondary | ICD-10-CM | POA: Diagnosis not present

## 2024-05-17 DIAGNOSIS — H04123 Dry eye syndrome of bilateral lacrimal glands: Secondary | ICD-10-CM | POA: Diagnosis not present

## 2024-05-17 DIAGNOSIS — H40013 Open angle with borderline findings, low risk, bilateral: Secondary | ICD-10-CM | POA: Diagnosis not present

## 2024-05-17 DIAGNOSIS — H2513 Age-related nuclear cataract, bilateral: Secondary | ICD-10-CM | POA: Diagnosis not present

## 2024-05-17 DIAGNOSIS — H0288A Meibomian gland dysfunction right eye, upper and lower eyelids: Secondary | ICD-10-CM | POA: Diagnosis not present

## 2024-05-17 DIAGNOSIS — E119 Type 2 diabetes mellitus without complications: Secondary | ICD-10-CM | POA: Diagnosis not present

## 2024-05-17 LAB — HM DIABETES EYE EXAM

## 2024-05-25 ENCOUNTER — Encounter: Payer: Self-pay | Admitting: Internal Medicine

## 2024-05-31 ENCOUNTER — Ambulatory Visit: Payer: Medicare HMO | Admitting: Internal Medicine

## 2024-05-31 ENCOUNTER — Encounter: Payer: Self-pay | Admitting: Internal Medicine

## 2024-05-31 VITALS — BP 132/68 | HR 76 | Temp 98.4°F | Ht 66.0 in | Wt 185.2 lb

## 2024-05-31 DIAGNOSIS — Z6829 Body mass index (BMI) 29.0-29.9, adult: Secondary | ICD-10-CM

## 2024-05-31 DIAGNOSIS — E785 Hyperlipidemia, unspecified: Secondary | ICD-10-CM

## 2024-05-31 DIAGNOSIS — J449 Chronic obstructive pulmonary disease, unspecified: Secondary | ICD-10-CM

## 2024-05-31 DIAGNOSIS — E1169 Type 2 diabetes mellitus with other specified complication: Secondary | ICD-10-CM | POA: Diagnosis not present

## 2024-05-31 DIAGNOSIS — E2839 Other primary ovarian failure: Secondary | ICD-10-CM | POA: Diagnosis not present

## 2024-05-31 DIAGNOSIS — E663 Overweight: Secondary | ICD-10-CM

## 2024-05-31 DIAGNOSIS — I119 Hypertensive heart disease without heart failure: Secondary | ICD-10-CM | POA: Diagnosis not present

## 2024-05-31 DIAGNOSIS — I700 Atherosclerosis of aorta: Secondary | ICD-10-CM | POA: Diagnosis not present

## 2024-05-31 NOTE — Progress Notes (Signed)
 I,[name], CMA,acting as a Neurosurgeon for Tammy Dung, MD.,have documented all relevant documentation on the behalf of Tammy Dung, MD,as directed by  Tammy Dung, MD while in the presence of Tammy Dung, MD.  Subjective:  Patient ID: [name] , female    DOB: 01/22/1946 , 78 y.o.   MRN: [id number]  Chief Complaint  Patient presents with   Diabetes    Patient presents today for a DM check. Patient doesn't have any specific questions or concerns. Patient denies headaches, chest pain or sob    hypertensive heart disease    HPI Discussed the use of AI scribe software for clinical note transcription with the patient, who gave verbal consent to proceed.  History of Present Illness [name] is a 78 year old female with diabetes and hypertension who presents for a routine check-up.  She has experienced fluctuating weight, recently noting a 10-pound increase, which she attributes to the effects of Ozempic . Initially, her weight decreased significantly but then increased again, causing frustration.  She experiences difficulty with physical activity due to breathing issues, impacting her ability to exercise regularly. She used to walk a mile daily but had to stop due to these breathing difficulties. She uses a Breztri  inhaler twice daily, one puff in the morning and one in the evening. Albuterol  is used as needed before activities that may cause breathing difficulties.  Regarding diabetes management, she has a glucose meter but does not use it regularly.    She is on Repatha  injections every other week for cholesterol management, which she started before her cholesterol was last checked in April. Her LDL cholesterol was noted to be at a satisfactory level of 50.   Diabetes She presents for her follow-up diabetic visit. She has type 2 diabetes mellitus. Her disease course has been stable. Pertinent negatives for hypoglycemia include no dizziness or headaches.  Pertinent negatives for diabetes include no blurred vision, no polydipsia, no polyphagia and no polyuria. There are no hypoglycemic complications. Risk factors for coronary artery disease include diabetes mellitus, dyslipidemia, hypertension, obesity, sedentary lifestyle and post-menopausal. She has not had a previous visit with a dietitian. She participates in exercise three times a week. Her breakfast blood glucose is taken between 8-9 am. Her breakfast blood glucose range is generally 110-130 mg/dl. Eye exam is current.  Hypertension This is a chronic problem. The current episode started more than 1 year ago. The problem has been gradually improving since onset. The problem is controlled. Pertinent negatives include no blurred vision or headaches. The current treatment provides moderate improvement. Compliance problems include exercise.      Past Medical History:  Diagnosis Date   Allergy  09/30/1996   Arthritis    Asthma    Complication of anesthesia    COPD (chronic obstructive pulmonary disease) (HCC)    Diabetes (HCC)    High cholesterol    History of radiation therapy    Right Lung- 12/04/21-12/11/21- Dr. Retta Caster   Hypertension    Lung cancer [facility])    Neuromuscular disorder (HCC)    Pneumonia 09/10/2017   PONV (postoperative nausea and vomiting)    Stroke [facility]) 2007   mini strokes     Family History  Problem Relation Age of Onset   Heart murmur Mother    Heart attack Father    Diabetes Father    Diabetes Brother    Stroke Brother    Vision loss Brother      Current  Outpatient Medications:    albuterol  (PROVENTIL ) (2.5 MG/3ML) 0.083% nebulizer solution, Take 3 mLs (2.5 mg total) by nebulization every 4 (four) hours as needed., Disp: 75 mL, Rfl: 12   albuterol  (VENTOLIN  HFA) 108 (90 Base) MCG/ACT inhaler, INHALE 2 PUFFS INTO THE LUNGS EVERY 4 HOURS AS NEEDED FOR WHEEZING OR SHORTNESS OF BREATH, Disp: 18 g, Rfl: 12   aspirin  81 MG tablet, Take 81 mg by mouth daily.,  Disp: , Rfl:    atorvastatin  (LIPITOR) 80 MG tablet, TAKE 1 TABLET BY MOUTH DAILY ON MONDAY-FRIDAY., Disp: 90 tablet, Rfl: 1   Biotin 10000 MCG TBDP, Take 10,000 mcg by mouth daily., Disp: , Rfl:    bisoprolol  (ZEBETA ) 5 MG tablet, TAKE 1/2 TABLET BY MOUTH EVERY DAY, Disp: 45 tablet, Rfl: 3   Budeson-Glycopyrrol-Formoterol  (BREZTRI  AEROSPHERE) 160-9-4.8 MCG/ACT AERO, Inhale 2 puffs into the lungs 2 (two) times daily., Disp: 10.7 g, Rfl: 6   Cholecalciferol (VITAMIN D3) 2000 UNITS TABS, Take 2,000 Units by mouth at bedtime., Disp: , Rfl:    DULoxetine  (CYMBALTA ) 60 MG capsule, TAKE 1 CAPSULE(60 MG) BY MOUTH DAILY, Disp: 90 capsule, Rfl: 1   Evolocumab  (REPATHA  SURECLICK) 140 MG/ML SOAJ, Inject 140 mg into the skin every 14 (fourteen) days., Disp: 6 mL, Rfl: 3   famotidine  (PEPCID ) 20 MG tablet, Take 1 tablet (20 mg total) by mouth daily after supper., Disp: 90 tablet, Rfl: 3   glucose blood (ACCU-CHEK GUIDE) test strip, 1 each by Other route daily. Use as instructed to check blood sugars 1 time per day dx: e11.22, Disp: 50 each, Rfl: 11   Magnesium  250 MG TABS, Take 1 tablet by mouth daily., Disp: , Rfl:    montelukast  (SINGULAIR ) 10 MG tablet, TAKE 1 TABLET(10 MG) BY MOUTH AT BEDTIME, Disp: 90 tablet, Rfl: 3   oxyCODONE -acetaminophen  (PERCOCET) 10-325 MG tablet, Take 1 tablet by mouth every 4 (four) hours as needed., Disp: 30 tablet, Rfl: 0   pantoprazole  (PROTONIX ) 40 MG tablet, TAKE 1 TABLET BY MOUTH 30 MINUTES BEFORE FIRST MEAL OF THE DAY, Disp: 90 tablet, Rfl: 2   pregabalin  (LYRICA ) 100 MG capsule, Take 1 capsule (100 mg total) by mouth 3 (three) times daily., Disp: 90 capsule, Rfl: 3   Semaglutide , 1 MG/DOSE, (OZEMPIC , 1 MG/DOSE,) 4 MG/3ML SOPN, Inject 1 mg into the skin once a week. Friday, Disp: 3 mL, Rfl: 3   CALCIUM -MAGNESIUM -ZINC PO, Take 1 capsule by mouth at bedtime. (Patient not taking: Reported on 05/31/2024), Disp: , Rfl:    predniSONE  (DELTASONE ) 10 MG tablet, 2 daily  until better,  then 1 daily x 5 days and stop (Patient not taking: Reported on 05/31/2024), Disp: 100 tablet, Rfl: 0   Allergies  Allergen Reactions   Other Swelling    HAIR DYE     Review of Systems  Constitutional: Negative.   Eyes:  Negative for blurred vision.  Respiratory: Negative.    Cardiovascular: Negative.   Gastrointestinal: Negative.   Endocrine: Negative for polydipsia, polyphagia and polyuria.  Neurological: Negative.  Negative for dizziness and headaches.  Psychiatric/Behavioral: Negative.       Today's Vitals   05/31/24 1202  BP: 132/68  Pulse: 76  Temp: 98.4 F (36.9 C)  SpO2: 98%  Weight: 185 lb 3.2 oz (84 kg)  Height: 5\' 6"  (1.676 m)   Body mass index is 29.89 kg/m.  Wt Readings from Last 3 Encounters:  05/31/24 185 lb 3.2 oz (84 kg)  04/27/24 175 lb 3.2 oz (79.5 kg)  04/12/24 177 lb 12.8 oz (80.6 kg)     Objective:  Physical Exam Vitals and nursing note reviewed.  Constitutional:      Appearance: Normal appearance.  HENT:     Head: Normocephalic and atraumatic.  Eyes:     Extraocular Movements: Extraocular movements intact.  Cardiovascular:     Rate and Rhythm: Normal rate and regular rhythm.     Heart sounds: Normal heart sounds.  Pulmonary:     Effort: Pulmonary effort is normal.     Breath sounds: Wheezing present.  Musculoskeletal:     Cervical back: Normal range of motion.  Skin:    General: Skin is warm.  Neurological:     General: No focal deficit present.     Mental Status: She is alert.  Psychiatric:        Mood and Affect: Mood normal.        Behavior: Behavior normal.       Assessment And Plan:  Dyslipidemia associated with type 2 diabetes mellitus (HCC) Assessment & Plan: Chronic, diabetic foot exam was performed.  LDL goal is less than 70.  She will continue with atorvastatin  80mg  M-F and ASA 81mg  daily.  She takes 1mg  semaglutide  weekly for diabetes  She will rto in 4 months.  - Reinforce importance of regular home blood glucose  monitoring   LDL at target with Repatha . Exercise recommended to improve cholesterol levels. - Continue Repatha . - Encourage regular exercise.     Orders: -     Hemoglobin A1c -     Amb Referral To Provider Referral Exercise Program (P.R.E.P) -     BMP8+eGFR  Hypertensive heart disease without heart failure Assessment & Plan: Chronic, fair control.  She will continue with bisoprolol  5mg  daily. She is encouraged to follow low sodium diet.   Orders: -     Amb Referral To Provider Referral Exercise Program (P.R.E.P)  Atherosclerosis of aorta (HCC) Assessment & Plan: Chronic, currently on statin therapy. She will continue with atorvastatin  80mg  M-F and ASA 81mg  daily for now.   COPD GOLD II  Assessment & Plan: Chronic, Pulmonary input is appreciated.  Still w/ intermittent wheezing - Continue Breztri  2 puffs twice daily w/ spacer - Avoid dairy - Albuterol  prn - Avoid known triggers   Estrogen deficiency Assessment & Plan: LDL at target with Repatha . Exercise recommended to improve cholesterol levels. - Continue Repatha . - Encourage regular exercise.  Orders: -     DG Bone Density; Future  Overweight with body mass index (BMI) of 29 to 29.9 in adult Assessment & Plan: Recent 10-pound weight gain. Difficulty with exercise due to breathing issues. - Encouraged chair exercises during TV commercials. - Discussed potential enrollment in free 12-week exercise program at [facility].     Return in 4 months (on 09/30/2024), or dm check.  Patient was given opportunity to ask questions. Patient verbalized understanding of the plan and was able to repeat key elements of the plan. All questions were answered to their satisfaction.   I, Tammy Dung, MD, have reviewed all documentation for this visit. The documentation on 05/31/24 for the exam, diagnosis, procedures, and orders are all accurate and complete.   IF YOU HAVE BEEN REFERRED TO A SPECIALIST, IT MAY TAKE 1-2 WEEKS TO  SCHEDULE/PROCESS THE REFERRAL. IF YOU HAVE NOT HEARD FROM US /SPECIALIST IN TWO WEEKS, PLEASE GIVE US  A CALL AT [phone].   THE PATIENT IS ENCOURAGED TO PRACTICE SOCIAL DISTANCING DUE TO THE COVID-19 PANDEMIC.

## 2024-05-31 NOTE — Patient Instructions (Signed)

## 2024-06-01 LAB — BMP8+EGFR
BUN/Creatinine Ratio: 15 (ref 12–28)
BUN: 11 mg/dL (ref 8–27)
CO2: 29 mmol/L (ref 20–29)
Calcium: 9.4 mg/dL (ref 8.7–10.3)
Chloride: 98 mmol/L (ref 96–106)
Creatinine, Ser: 0.73 mg/dL (ref 0.57–1.00)
Glucose: 61 mg/dL — ABNORMAL LOW (ref 70–99)
Potassium: 5.1 mmol/L (ref 3.5–5.2)
Sodium: 139 mmol/L (ref 134–144)
eGFR: 85 mL/min/{1.73_m2} (ref >59)

## 2024-06-01 LAB — HEMOGLOBIN A1C
Est. average glucose Bld gHb Est-mCnc: 137 mg/dL
Hgb A1c MFr Bld: 6.4 % — ABNORMAL HIGH (ref 4.8–5.6)

## 2024-06-02 ENCOUNTER — Ambulatory Visit: Payer: Self-pay | Admitting: Internal Medicine

## 2024-06-03 ENCOUNTER — Telehealth: Payer: Self-pay

## 2024-06-03 NOTE — Telephone Encounter (Signed)
 Called  re: PREP program referral, left voicemail requesting return call.

## 2024-06-05 DIAGNOSIS — R61 Generalized hyperhidrosis: Secondary | ICD-10-CM | POA: Insufficient documentation

## 2024-06-05 NOTE — Assessment & Plan Note (Signed)
 Recent 10-pound weight gain. Difficulty with exercise due to breathing issues. - Encouraged chair exercises during TV commercials. - Discussed potential enrollment in free 12-week exercise program at [facility].

## 2024-06-05 NOTE — Assessment & Plan Note (Signed)
 LDL at target with Repatha . Exercise recommended to improve cholesterol levels. - Continue Repatha . - Encourage regular exercise.

## 2024-06-05 NOTE — Assessment & Plan Note (Addendum)
 Chronic, diabetic foot exam was performed.  LDL goal is less than 70.  She will continue with atorvastatin  80mg  M-F and ASA 81mg  daily.  She takes 1mg  semaglutide  weekly for diabetes  She will rto in 4 months.  - Reinforce importance of regular home blood glucose monitoring   LDL at target with Repatha . Exercise recommended to improve cholesterol levels. - Continue Repatha . - Encourage regular exercise.

## 2024-06-05 NOTE — Assessment & Plan Note (Signed)
 Chronic, she will continue with Ozempic  1mg  weekly. LDL goal is less than 70, continue with statin therapy.  - Reinforce importance of regular home blood glucose monitoring

## 2024-06-05 NOTE — Assessment & Plan Note (Signed)
 Chronic, Pulmonary input is appreciated.  Still w/ intermittent wheezing - Continue Breztri  2 puffs twice daily w/ spacer - Avoid dairy - Albuterol  prn - Avoid known triggers

## 2024-06-05 NOTE — Assessment & Plan Note (Signed)
 Chronic, fair control.  She will continue with bisoprolol  5mg  daily. She is encouraged to follow low sodium diet.

## 2024-06-05 NOTE — Assessment & Plan Note (Signed)
 Intermittent night sweats and hot flashes with no specific cause identified

## 2024-06-05 NOTE — Assessment & Plan Note (Signed)
Chronic, currently on statin therapy. She will continue with atorvastatin 80mg  M-F and ASA 81mg  daily for now.

## 2024-06-09 ENCOUNTER — Telehealth: Payer: Self-pay

## 2024-06-09 NOTE — Telephone Encounter (Signed)
 Called to discuss PREP program referral; left voicemail requesting return call.

## 2024-06-09 NOTE — Telephone Encounter (Signed)
 She returned my call, she shared that she is not interested or able to attend a program anywhere right now, with her COPD, states she can hardly walk from room to room in her house.

## 2024-06-10 ENCOUNTER — Encounter: Payer: Self-pay | Admitting: Internal Medicine

## 2024-06-10 DIAGNOSIS — E2839 Other primary ovarian failure: Secondary | ICD-10-CM | POA: Diagnosis not present

## 2024-06-10 LAB — HM DEXA SCAN: HM Dexa Scan: NORMAL

## 2024-06-14 ENCOUNTER — Other Ambulatory Visit: Payer: Self-pay

## 2024-06-14 DIAGNOSIS — J441 Chronic obstructive pulmonary disease with (acute) exacerbation: Secondary | ICD-10-CM

## 2024-06-14 DIAGNOSIS — J4489 Other specified chronic obstructive pulmonary disease: Secondary | ICD-10-CM

## 2024-06-14 NOTE — Telephone Encounter (Signed)
 Refer to [facility] for dx of VCD

## 2024-06-15 ENCOUNTER — Encounter (INDEPENDENT_AMBULATORY_CARE_PROVIDER_SITE_OTHER): Payer: Self-pay

## 2024-06-15 ENCOUNTER — Other Ambulatory Visit: Payer: Self-pay

## 2024-06-15 DIAGNOSIS — J383 Other diseases of vocal cords: Secondary | ICD-10-CM

## 2024-06-15 NOTE — Progress Notes (Signed)
 [facility] for dx of VCD   Daphney Eans, SLP  [facility]  [address]  [phone]

## 2024-06-16 ENCOUNTER — Encounter: Payer: Self-pay | Admitting: Internal Medicine

## 2024-06-17 ENCOUNTER — Encounter: Payer: Self-pay | Admitting: Internal Medicine

## 2024-06-17 ENCOUNTER — Ambulatory Visit: Payer: Self-pay | Admitting: Internal Medicine

## 2024-06-18 ENCOUNTER — Telehealth: Payer: Self-pay

## 2024-06-18 NOTE — Telephone Encounter (Signed)
 Tried to contact patient regarding prior message for Oxycodone  for cough. Dr.Ramaswamy is not available .   Nothing else further needed.

## 2024-06-18 NOTE — Telephone Encounter (Signed)
 Oxycodone  is for pain.  So not sure how she is taking it for cough.  If you get this prescription out before 14: 30 I can fingerprint sign it

## 2024-06-28 ENCOUNTER — Encounter (HOSPITAL_BASED_OUTPATIENT_CLINIC_OR_DEPARTMENT_OTHER): Payer: Self-pay | Admitting: Emergency Medicine

## 2024-06-28 ENCOUNTER — Other Ambulatory Visit: Payer: Self-pay

## 2024-06-28 ENCOUNTER — Emergency Department (HOSPITAL_BASED_OUTPATIENT_CLINIC_OR_DEPARTMENT_OTHER)

## 2024-06-28 ENCOUNTER — Inpatient Hospital Stay (HOSPITAL_BASED_OUTPATIENT_CLINIC_OR_DEPARTMENT_OTHER)
Admission: EM | Admit: 2024-06-28 | Discharge: 2024-07-04 | DRG: 189 | Disposition: A | Discharge status: Home / Self Care | Attending: Internal Medicine | Admitting: Internal Medicine

## 2024-06-28 DIAGNOSIS — G8929 Other chronic pain: Secondary | ICD-10-CM | POA: Diagnosis present

## 2024-06-28 DIAGNOSIS — I5032 Chronic diastolic (congestive) heart failure: Secondary | ICD-10-CM | POA: Diagnosis present

## 2024-06-28 DIAGNOSIS — Z833 Family history of diabetes mellitus: Secondary | ICD-10-CM | POA: Diagnosis not present

## 2024-06-28 DIAGNOSIS — Z7982 Long term (current) use of aspirin: Secondary | ICD-10-CM

## 2024-06-28 DIAGNOSIS — Z79899 Other long term (current) drug therapy: Secondary | ICD-10-CM | POA: Diagnosis not present

## 2024-06-28 DIAGNOSIS — Z87891 Personal history of nicotine dependence: Secondary | ICD-10-CM

## 2024-06-28 DIAGNOSIS — I10 Essential (primary) hypertension: Secondary | ICD-10-CM | POA: Diagnosis not present

## 2024-06-28 DIAGNOSIS — Z8249 Family history of ischemic heart disease and other diseases of the circulatory system: Secondary | ICD-10-CM | POA: Diagnosis not present

## 2024-06-28 DIAGNOSIS — Z90710 Acquired absence of both cervix and uterus: Secondary | ICD-10-CM

## 2024-06-28 DIAGNOSIS — E785 Hyperlipidemia, unspecified: Secondary | ICD-10-CM | POA: Diagnosis present

## 2024-06-28 DIAGNOSIS — Z923 Personal history of irradiation: Secondary | ICD-10-CM

## 2024-06-28 DIAGNOSIS — Z1152 Encounter for screening for COVID-19: Secondary | ICD-10-CM

## 2024-06-28 DIAGNOSIS — Z7951 Long term (current) use of inhaled steroids: Secondary | ICD-10-CM | POA: Diagnosis not present

## 2024-06-28 DIAGNOSIS — J384 Edema of larynx: Secondary | ICD-10-CM | POA: Diagnosis present

## 2024-06-28 DIAGNOSIS — Z8673 Personal history of transient ischemic attack (TIA), and cerebral infarction without residual deficits: Secondary | ICD-10-CM | POA: Diagnosis not present

## 2024-06-28 DIAGNOSIS — Z7985 Long-term (current) use of injectable non-insulin antidiabetic drugs: Secondary | ICD-10-CM

## 2024-06-28 DIAGNOSIS — R0602 Shortness of breath: Secondary | ICD-10-CM | POA: Diagnosis not present

## 2024-06-28 DIAGNOSIS — E119 Type 2 diabetes mellitus without complications: Secondary | ICD-10-CM

## 2024-06-28 DIAGNOSIS — J383 Other diseases of vocal cords: Secondary | ICD-10-CM | POA: Diagnosis not present

## 2024-06-28 DIAGNOSIS — J441 Chronic obstructive pulmonary disease with (acute) exacerbation: Secondary | ICD-10-CM | POA: Diagnosis present

## 2024-06-28 DIAGNOSIS — K219 Gastro-esophageal reflux disease without esophagitis: Secondary | ICD-10-CM | POA: Diagnosis present

## 2024-06-28 DIAGNOSIS — E7800 Pure hypercholesterolemia, unspecified: Secondary | ICD-10-CM | POA: Diagnosis present

## 2024-06-28 DIAGNOSIS — Z823 Family history of stroke: Secondary | ICD-10-CM | POA: Diagnosis not present

## 2024-06-28 DIAGNOSIS — E1169 Type 2 diabetes mellitus with other specified complication: Secondary | ICD-10-CM

## 2024-06-28 DIAGNOSIS — J439 Emphysema, unspecified: Secondary | ICD-10-CM | POA: Diagnosis not present

## 2024-06-28 DIAGNOSIS — E1142 Type 2 diabetes mellitus with diabetic polyneuropathy: Secondary | ICD-10-CM | POA: Diagnosis present

## 2024-06-28 DIAGNOSIS — I110 Hypertensive heart disease with heart failure: Secondary | ICD-10-CM | POA: Diagnosis present

## 2024-06-28 DIAGNOSIS — E782 Mixed hyperlipidemia: Secondary | ICD-10-CM | POA: Diagnosis not present

## 2024-06-28 DIAGNOSIS — Z821 Family history of blindness and visual loss: Secondary | ICD-10-CM | POA: Diagnosis not present

## 2024-06-28 DIAGNOSIS — I700 Atherosclerosis of aorta: Secondary | ICD-10-CM | POA: Diagnosis not present

## 2024-06-28 DIAGNOSIS — J432 Centrilobular emphysema: Secondary | ICD-10-CM | POA: Diagnosis not present

## 2024-06-28 DIAGNOSIS — I2510 Atherosclerotic heart disease of native coronary artery without angina pectoris: Secondary | ICD-10-CM | POA: Diagnosis not present

## 2024-06-28 DIAGNOSIS — J9601 Acute respiratory failure with hypoxia: Principal | ICD-10-CM | POA: Insufficient documentation

## 2024-06-28 DIAGNOSIS — E1122 Type 2 diabetes mellitus with diabetic chronic kidney disease: Secondary | ICD-10-CM

## 2024-06-28 DIAGNOSIS — Z85118 Personal history of other malignant neoplasm of bronchus and lung: Secondary | ICD-10-CM | POA: Diagnosis not present

## 2024-06-28 DIAGNOSIS — R918 Other nonspecific abnormal finding of lung field: Secondary | ICD-10-CM | POA: Diagnosis not present

## 2024-06-28 LAB — CBC
HCT: 39 % (ref 36.0–46.0)
Hemoglobin: 12.8 g/dL (ref 12.0–15.0)
MCH: 28.7 pg (ref 26.0–34.0)
MCHC: 32.8 g/dL (ref 30.0–36.0)
MCV: 87.4 fL (ref 80.0–100.0)
Platelets: 334 10*3/uL (ref 150–400)
RBC: 4.46 MIL/uL (ref 3.87–5.11)
RDW: 14.7 % (ref 11.5–15.5)
WBC: 6.2 10*3/uL (ref 4.0–10.5)
nRBC: 0 % (ref 0.0–0.2)

## 2024-06-28 LAB — BASIC METABOLIC PANEL WITH GFR
Anion gap: 11 (ref 5–15)
BUN: 9 mg/dL (ref 8–23)
CO2: 28 mmol/L (ref 22–32)
Calcium: 9.4 mg/dL (ref 8.9–10.3)
Chloride: 97 mmol/L — ABNORMAL LOW (ref 98–111)
Creatinine, Ser: 0.85 mg/dL (ref 0.44–1.00)
GFR, Estimated: >60 mL/min (ref >60)
Glucose, Bld: 130 mg/dL — ABNORMAL HIGH (ref 70–99)
Potassium: 4 mmol/L (ref 3.5–5.1)
Sodium: 136 mmol/L (ref 135–145)

## 2024-06-28 LAB — RESP PANEL BY RT-PCR (RSV, FLU A&B, COVID)  RVPGX2
Influenza A by PCR: NEGATIVE
Influenza B by PCR: NEGATIVE
Resp Syncytial Virus by PCR: NEGATIVE
SARS Coronavirus 2 by RT PCR: NEGATIVE

## 2024-06-28 LAB — TROPONIN T, HIGH SENSITIVITY
Troponin T High Sensitivity: <15 ng/L (ref <19)
Troponin T High Sensitivity: <15 ng/L (ref <19)

## 2024-06-28 LAB — PRO BRAIN NATRIURETIC PEPTIDE: Pro Brain Natriuretic Peptide: 78.9 pg/mL (ref <300.0)

## 2024-06-28 MED ORDER — MAGNESIUM SULFATE 2 GM/50ML IV SOLN
2.0000 g | Freq: Once | INTRAVENOUS | Status: AC
  Administered 2024-06-28: 2 g via INTRAVENOUS
  Filled 2024-06-28: qty 50

## 2024-06-28 MED ORDER — IPRATROPIUM BROMIDE 0.02 % IN SOLN: 1.0000 mg | Freq: Once | RESPIRATORY_TRACT | Status: AC

## 2024-06-28 MED ORDER — ALBUTEROL SULFATE (2.5 MG/3ML) 0.083% IN NEBU
2.5000 mg | INHALATION_SOLUTION | RESPIRATORY_TRACT | Status: DC | PRN
  Administered 2024-06-29 – 2024-06-30 (×2): 2.5 mg via RESPIRATORY_TRACT
  Filled 2024-06-28 (×2): qty 3

## 2024-06-28 MED ORDER — ALBUTEROL SULFATE (2.5 MG/3ML) 0.083% IN NEBU
INHALATION_SOLUTION | RESPIRATORY_TRACT | Status: AC
  Filled 2024-06-28: qty 12

## 2024-06-28 MED ORDER — IOHEXOL 350 MG/ML SOLN
75.0000 mL | Freq: Once | INTRAVENOUS | Status: AC | PRN
  Administered 2024-06-28: 100 mL via INTRAVENOUS

## 2024-06-28 MED ORDER — ALBUTEROL SULFATE (2.5 MG/3ML) 0.083% IN NEBU: 10.0000 mg | INHALATION_SOLUTION | Freq: Once | RESPIRATORY_TRACT | Status: DC

## 2024-06-28 MED ORDER — METHYLPREDNISOLONE SODIUM SUCC 125 MG IJ SOLR
125.0000 mg | Freq: Once | INTRAMUSCULAR | Status: AC
Start: 2024-06-28 — End: 2024-06-28
  Administered 2024-06-28: 125 mg via INTRAVENOUS
  Filled 2024-06-28: qty 2

## 2024-06-28 MED ORDER — IPRATROPIUM BROMIDE 0.02 % IN SOLN
RESPIRATORY_TRACT | Status: AC
  Administered 2024-06-28: 1 mg via RESPIRATORY_TRACT
  Filled 2024-06-28: qty 5

## 2024-06-28 MED ORDER — ALBUTEROL SULFATE (2.5 MG/3ML) 0.083% IN NEBU
INHALATION_SOLUTION | RESPIRATORY_TRACT | Status: AC
  Administered 2024-06-28: 2.5 mg
  Filled 2024-06-28: qty 3

## 2024-06-28 NOTE — ED Triage Notes (Signed)
 C/o SHOB w/ cough. Recent travel.  Obvious distress during triage. Hx of COPD. Denies fevers.

## 2024-06-28 NOTE — ED Provider Notes (Signed)
 [place] EMERGENCY DEPARTMENT AT [facility] Provider Note   CSN: [id number] Arrival date & time: 06/28/24  1612     Patient presents with: Shortness of Breath   [name] is a 78 y.o. female.  She has a history of COPD hypertension lung cancer diabetes.  She said she has had increased shortness of breath and nonproductive cough for over a week.  Went on a cruise in [place] .  She does not think she has had a fever.  She gets some intense cramps in her ribs with cough.  No hemoptysis.  No vomiting or diarrhea.  Used nebulizer without improvement.  No leg swelling or pain   The history is provided by the patient.  Shortness of Breath Severity:  Severe Onset quality:  Gradual Duration:  1 week Timing:  Constant Progression:  Worsening Chronicity:  Recurrent Relieved by:  Nothing Worsened by:  Activity and coughing Ineffective treatments: neb. Associated symptoms: chest pain, cough and wheezing   Associated symptoms: no abdominal pain, no fever, no hemoptysis, no sputum production and no vomiting   Risk factors: hx of cancer   Risk factors: no tobacco use        Prior to Admission medications   Medication Sig Start Date End Date Taking? Authorizing Provider  albuterol  (PROVENTIL ) (2.5 MG/3ML) 0.083% nebulizer solution Take 3 mLs (2.5 mg total) by nebulization every 4 (four) hours as needed. 03/17/24   Darlean Ozell NOVAK, MD  albuterol  (VENTOLIN  HFA) 108 (90 Base) MCG/ACT inhaler INHALE 2 PUFFS INTO THE LUNGS EVERY 4 HOURS AS NEEDED FOR WHEEZING OR SHORTNESS OF BREATH 01/14/24   Jarold Medici, MD  aspirin  81 MG tablet Take 81 mg by mouth daily.    [provider]  atorvastatin  (LIPITOR) 80 MG tablet TAKE 1 TABLET BY MOUTH DAILY ON MONDAY-FRIDAY. 03/09/24   Jarold Medici, MD  Biotin 89999 MCG TBDP Take 10,000 mcg by mouth daily.    [provider]  bisoprolol  (ZEBETA ) 5 MG tablet TAKE 1/2 TABLET BY MOUTH EVERY DAY 12/04/23   Jarold Medici, MD   Budeson-Glycopyrrol-Formoterol  (BREZTRI  AEROSPHERE) 160-9-4.8 MCG/ACT AERO Inhale 2 puffs into the lungs 2 (two) times daily. 12/11/23   Jarold Medici, MD  CALCIUM -MAGNESIUM -ZINC PO Take 1 capsule by mouth at bedtime. Patient not taking: Reported on 05/31/2024    [provider]  Cholecalciferol (VITAMIN D3) 2000 UNITS TABS Take 2,000 Units by mouth at bedtime.    [provider]  DULoxetine  (CYMBALTA ) 60 MG capsule TAKE 1 CAPSULE(60 MG) BY MOUTH DAILY 02/02/24   Jarold Medici, MD  Evolocumab  (REPATHA  SURECLICK) 140 MG/ML SOAJ Inject 140 mg into the skin every 14 (fourteen) days. 12/29/23   Hilty, Vinie JAYSON, MD  famotidine  (PEPCID ) 20 MG tablet Take 1 tablet (20 mg total) by mouth daily after supper. 10/11/23   Olalere, Adewale A, MD  glucose blood (ACCU-CHEK GUIDE) test strip 1 each by Other route daily. Use as instructed to check blood sugars 1 time per day dx: e11.22 09/10/19   Jarold Medici, MD  Magnesium  250 MG TABS Take 1 tablet by mouth daily.    [provider]  montelukast  (SINGULAIR ) 10 MG tablet TAKE 1 TABLET(10 MG) BY MOUTH AT BEDTIME 10/31/23   Olalere, Adewale A, MD  oxyCODONE -acetaminophen  (PERCOCET) 10-325 MG tablet Take 1 tablet by mouth every 4 (four) hours as needed. 04/29/24   Darlean Ozell NOVAK, MD  pantoprazole  (PROTONIX ) 40 MG tablet TAKE 1 TABLET BY MOUTH 30 MINUTES BEFORE FIRST MEAL OF THE  DAY 02/09/24   Jarold Medici, MD  predniSONE  (DELTASONE ) 10 MG tablet 2 daily  until better, then 1 daily x 5 days and stop Patient not taking: Reported on 05/31/2024 03/17/24   Darlean Ozell NOVAK, MD  pregabalin  (LYRICA ) 100 MG capsule Take 1 capsule (100 mg total) by mouth 3 (three) times daily. 02/18/24   Jarold Medici, MD  Semaglutide , 1 MG/DOSE, (OZEMPIC , 1 MG/DOSE,) 4 MG/3ML SOPN Inject 1 mg into the skin once a week. Friday 04/23/22   Jarold Medici, MD    Allergies: Other    Review of Systems  Constitutional:  Negative for fever.  Eyes:  Negative for visual  disturbance.  Respiratory:  Positive for cough, shortness of breath and wheezing. Negative for hemoptysis and sputum production.   Cardiovascular:  Positive for chest pain.  Gastrointestinal:  Negative for abdominal pain, diarrhea and vomiting.  Genitourinary:  Negative for dysuria.    Updated Vital Signs Pulse (!) 122   Resp (!) 24   Ht 5' 6 (1.676 m)   Wt 84 kg   SpO2 (!) 88%   BMI 29.89 kg/m   Physical Exam Vitals and nursing note reviewed.  Constitutional:      General: She is in acute distress.     Appearance: Normal appearance. She is well-developed.  HENT:     Head: Normocephalic and atraumatic.   Eyes:     Conjunctiva/sclera: Conjunctivae normal.    Cardiovascular:     Rate and Rhythm: Regular rhythm. Tachycardia present.     Heart sounds: No murmur heard. Pulmonary:     Effort: Tachypnea, accessory muscle usage and respiratory distress present.     Breath sounds: No stridor. Wheezing and rhonchi present.  Abdominal:     Palpations: Abdomen is soft.     Tenderness: There is no abdominal tenderness. There is no guarding or rebound.   Musculoskeletal:        General: No tenderness or deformity. Normal range of motion.     Cervical back: Neck supple.     Right lower leg: No edema.     Left lower leg: No edema.   Skin:    General: Skin is warm and dry.   Neurological:     General: No focal deficit present.     Mental Status: She is alert.     GCS: GCS eye subscore is 4. GCS verbal subscore is 5. GCS motor subscore is 6.     Motor: No weakness.     (all labs ordered are listed, but only abnormal results are displayed) Labs Reviewed  BASIC METABOLIC PANEL WITH GFR - Abnormal; Notable for the following components:      Result Value   Chloride 97 (*)    Glucose, Bld 130 (*)    All other components within normal limits  COMPREHENSIVE METABOLIC PANEL WITH GFR - Abnormal; Notable for the following components:   Glucose, Bld 132 (*)    All other components  within normal limits  MAGNESIUM  - Abnormal; Notable for the following components:   Magnesium  2.8 (*)    All other components within normal limits  GLUCOSE, CAPILLARY - Abnormal; Notable for the following components:   Glucose-Capillary 126 (*)    All other components within normal limits  CULTURE, BLOOD (ROUTINE X 2)  CULTURE, BLOOD (ROUTINE X 2)  RESP PANEL BY RT-PCR (RSV, FLU A&B, COVID)  RVPGX2  CBC  PRO BRAIN NATRIURETIC PEPTIDE  CBC WITH DIFFERENTIAL/PLATELET  PHOSPHORUS  TROPONIN T, HIGH SENSITIVITY  TROPONIN T, HIGH SENSITIVITY    EKG: EKG Interpretation Date/Time:  Monday June 28 2024 16:30:32 EDT Ventricular Rate:  117 PR Interval:  170 QRS Duration:  77 QT Interval:  330 QTC Calculation: 461 R Axis:   81  Text Interpretation: Sinus tachycardia Right atrial enlargement Borderline right axis deviation Artifact in lead(s) I II aVR aVL No significant change since prior 12/23 Confirmed by Towana Sharper [phone]) on 06/28/2024 4:32:08 PM  Radiology: CT Angio Chest PE W/Cm &/Or Wo Cm Result Date: 06/28/2024 CLINICAL DATA:  PE suspected. Shortness of breath with cough. Recent travel. History of COPD EXAM: CT ANGIOGRAPHY CHEST WITH CONTRAST TECHNIQUE: Multidetector CT imaging of the chest was performed using the standard protocol during bolus administration of intravenous contrast. Multiplanar CT image reconstructions and MIPs were obtained to evaluate the vascular anatomy. RADIATION DOSE REDUCTION: This exam was performed according to the departmental dose-optimization program which includes automated exposure control, adjustment of the mA and/or kV according to patient size and/or use of iterative reconstruction technique. CONTRAST:  OMNIPAQUE  IOHEXOL  350 MG/ML SOLN COMPARISON:  Same day radiograph and CT chest 04/08/2024 FINDINGS: Cardiovascular: No pericardial effusion. Normal caliber thoracic aorta without dissection. Coronary artery and aortic atherosclerotic  calcification. Negative for acute pulmonary embolism. Mediastinum/Nodes: Similar soft tissue fullness in the upper right hilar region measuring 12 mm, previously 13 mm. Inferior index right hilar lymph node measures 13 mm, previously 10 mm. Trachea and esophagus are unremarkable. Lungs/Pleura: Centrilobular and paraseptal emphysema. Post treatment scarring in the suprahilar posterior right lung about a fiducial marker is not substantially changed from 04/08/2024. 5 mm perifissural nodule in the right middle lobe is unchanged. New 3 mm nodule in the anterior left upper lobe on series 6/image 51. No pleural effusion or pneumothorax. Upper Abdomen: No acute abnormality. Musculoskeletal: No acute fracture. Review of the MIP images confirms the above findings. IMPRESSION: 1. Negative for acute pulmonary embolism. 2. Post treatment scarring in the suprahilar posterior right lung is not substantially changed from 04/08/2024. 3. Similar soft tissue fullness in the upper right hilar region. Inferior index right hilar lymph node measures 13 mm, previously 10 mm. 4. New 3 mm nodule in the anterior left upper lobe. Stable 5 mm perifissural nodule in the right middle lobe. Aortic Atherosclerosis (ICD10-I70.0) and Emphysema (ICD10-J43.9). Electronically Signed   By: Norman Gatlin M.D.   On: 06/28/2024 21:28   DG Chest Port 1 View Result Date: 06/28/2024 CLINICAL DATA:  Shortness of breath EXAM: PORTABLE CHEST 1 VIEW COMPARISON:  12/11/2022, CT 04/08/2024 FINDINGS: Hyperinflation with emphysema. Distortion in the right suprahilar lung with fiducial marker. Pleuroparenchymal disease and scarring at the left base, question increased pleural density, possible effusion. Stable cardiomediastinal silhouette with aortic atherosclerosis IMPRESSION: 1. Hyperinflation with emphysema. Pleuroparenchymal disease and scarring at the left base, question increased pleural density, possible effusion. 2. Distortion in the right suprahilar lung  with fiducial marker, reference chest CT 04/08/2024. Electronically Signed   By: Luke Bun M.D.   On: 06/28/2024 17:16     .Critical Care  Performed by: Towana Sharper BROCKS, MD Authorized by: Towana Sharper BROCKS, MD   Critical care provider statement:    Critical care time (minutes):  45   Critical care time was exclusive of:  Separately billable procedures and treating other patients   Critical care was necessary to treat or prevent imminent or life-threatening deterioration of the following conditions:  Respiratory failure   Critical care was time spent personally by me on the following  activities:  Development of treatment plan with patient or surrogate, discussions with consultants, evaluation of patient's response to treatment, examination of patient, obtaining history from patient or surrogate, ordering and performing treatments and interventions, ordering and review of laboratory studies, ordering and review of radiographic studies, pulse oximetry and re-evaluation of patient's condition   I assumed direction of critical care for this patient from another provider in my specialty: no      Medications Ordered in the ED  albuterol  (PROVENTIL ) (2.5 MG/3ML) 0.083% nebulizer solution 2.5 mg (has no administration in time range)  acetaminophen  (TYLENOL ) tablet 650 mg (has no administration in time range)    Or  acetaminophen  (TYLENOL ) suppository 650 mg (has no administration in time range)  melatonin tablet 3 mg (has no administration in time range)  ondansetron  (ZOFRAN ) injection 4 mg (has no administration in time range)  methylPREDNISolone  sodium succinate (SOLU-MEDROL ) 125 mg/2 mL injection 80 mg (80 mg Intravenous Given 06/29/24 0852)  ipratropium-albuterol  (DUONEB) 0.5-2.5 (3) MG/3ML nebulizer solution 3 mL (3 mLs Nebulization Given 06/29/24 0802)  insulin  aspart (novoLOG ) injection 0-9 Units ( Subcutaneous Not Given 06/29/24 0816)  aspirin  EC tablet 81 mg (has no administration in time  range)  atorvastatin  (LIPITOR) tablet 80 mg (has no administration in time range)  bisoprolol  (ZEBETA ) tablet 2.5 mg (has no administration in time range)  DULoxetine  (CYMBALTA ) DR capsule 60 mg (has no administration in time range)  famotidine  (PEPCID ) tablet 20 mg (has no administration in time range)  pantoprazole  (PROTONIX ) EC tablet 40 mg (has no administration in time range)  pregabalin  (LYRICA ) capsule 100 mg (has no administration in time range)  montelukast  (SINGULAIR ) tablet 10 mg (has no administration in time range)  ipratropium (ATROVENT ) nebulizer solution 1 mg (1 mg Nebulization Given 06/28/24 1634)  methylPREDNISolone  sodium succinate (SOLU-MEDROL ) 125 mg/2 mL injection 125 mg (125 mg Intravenous Given 06/28/24 1701)  magnesium  sulfate IVPB 2 g 50 mL (0 g Intravenous Stopped 06/28/24 1829)  iohexol  (OMNIPAQUE ) 350 MG/ML injection 75 mL (100 mLs Intravenous Contrast Given 06/28/24 1748)  albuterol  (PROVENTIL ) (2.5 MG/3ML) 0.083% nebulizer solution (2.5 mg  Given 06/28/24 1913)    Clinical Course as of 06/28/24 2235  Mon Jun 28, 2024  1713 Chest x-ray interpreted by me as no definite infiltrate.  Awaiting radiology reading. [MB]  2228 Patient still requiring 2 L nasal cannula and has some expiratory wheezing.  Discussed with Dr. Shona [facility] who will put the patient in for an admission bed. [MB]    Clinical Course User Index [MB] Towana Ozell BROCKS, MD                                 Medical Decision Making Amount and/or Complexity of Data Reviewed Labs: ordered. Radiology: ordered.  Risk Prescription drug management. Decision regarding hospitalization.   This patient complains of shortness of breath cough; this involves an extensive number of treatment Options and is a complaint that carries with it a high risk of complications and morbidity. The differential includes pneumonia, PE, CHF, COPD, infection, pneumothorax  I ordered, reviewed and interpreted labs,  which included CBC normal chemistries fairly normal other than mildly elevated glucose, blood culture sent, troponin and BNP normal, COVID and flu negative I ordered medication breathing treatments steroids magnesium  oxygen and reviewed PMP when indicated. I ordered imaging studies which included chest x-ray and CT angio chest and I independently    visualized and interpreted  imaging which showed no PE and no clear pneumonia Previous records obtained and reviewed including recent pulmonology and cardiology notes I consulted [facility] hospitalist Dr. Shona and discussed lab and imaging findings and discussed disposition.  Cardiac monitoring reviewed, sinus tachycardia improving to normal sinus rhythm Social determinants considered, physically inactive and housing insecurity Critical Interventions: Initiation of nebulizers and oxygen for hypoxia and increased work of breathing  After the interventions stated above, I reevaluated the patient and found patient still to be requiring oxygen although work of breathing is greatly improved Admission and further testing considered, she would benefit from mission to the hospital for continued management.  She is in agreement with plan for admission.      Final diagnoses:  Acute respiratory failure with hypoxia (HCC)  Acute exacerbation of chronic obstructive pulmonary disease (COPD) [facility])    ED Discharge Orders     None          Towana Ozell BROCKS, MD 06/29/24 [phone]

## 2024-06-29 ENCOUNTER — Encounter (HOSPITAL_COMMUNITY): Payer: Self-pay | Admitting: Internal Medicine

## 2024-06-29 DIAGNOSIS — J384 Edema of larynx: Secondary | ICD-10-CM | POA: Diagnosis present

## 2024-06-29 DIAGNOSIS — Z90710 Acquired absence of both cervix and uterus: Secondary | ICD-10-CM | POA: Diagnosis not present

## 2024-06-29 DIAGNOSIS — Z7982 Long term (current) use of aspirin: Secondary | ICD-10-CM | POA: Diagnosis not present

## 2024-06-29 DIAGNOSIS — I110 Hypertensive heart disease with heart failure: Secondary | ICD-10-CM | POA: Diagnosis present

## 2024-06-29 DIAGNOSIS — R0602 Shortness of breath: Secondary | ICD-10-CM | POA: Diagnosis not present

## 2024-06-29 DIAGNOSIS — J383 Other diseases of vocal cords: Secondary | ICD-10-CM | POA: Diagnosis not present

## 2024-06-29 DIAGNOSIS — I5032 Chronic diastolic (congestive) heart failure: Secondary | ICD-10-CM

## 2024-06-29 DIAGNOSIS — E785 Hyperlipidemia, unspecified: Secondary | ICD-10-CM | POA: Diagnosis present

## 2024-06-29 DIAGNOSIS — G8929 Other chronic pain: Secondary | ICD-10-CM | POA: Diagnosis present

## 2024-06-29 DIAGNOSIS — K219 Gastro-esophageal reflux disease without esophagitis: Secondary | ICD-10-CM | POA: Diagnosis present

## 2024-06-29 DIAGNOSIS — I10 Essential (primary) hypertension: Secondary | ICD-10-CM

## 2024-06-29 DIAGNOSIS — Z833 Family history of diabetes mellitus: Secondary | ICD-10-CM | POA: Diagnosis not present

## 2024-06-29 DIAGNOSIS — J441 Chronic obstructive pulmonary disease with (acute) exacerbation: Secondary | ICD-10-CM | POA: Diagnosis present

## 2024-06-29 DIAGNOSIS — E782 Mixed hyperlipidemia: Secondary | ICD-10-CM

## 2024-06-29 DIAGNOSIS — Z1152 Encounter for screening for COVID-19: Secondary | ICD-10-CM | POA: Diagnosis not present

## 2024-06-29 DIAGNOSIS — E1142 Type 2 diabetes mellitus with diabetic polyneuropathy: Secondary | ICD-10-CM

## 2024-06-29 DIAGNOSIS — Z823 Family history of stroke: Secondary | ICD-10-CM | POA: Diagnosis not present

## 2024-06-29 DIAGNOSIS — Z923 Personal history of irradiation: Secondary | ICD-10-CM | POA: Diagnosis not present

## 2024-06-29 DIAGNOSIS — Z79899 Other long term (current) drug therapy: Secondary | ICD-10-CM | POA: Diagnosis not present

## 2024-06-29 DIAGNOSIS — E7800 Pure hypercholesterolemia, unspecified: Secondary | ICD-10-CM | POA: Diagnosis present

## 2024-06-29 DIAGNOSIS — Z7951 Long term (current) use of inhaled steroids: Secondary | ICD-10-CM | POA: Diagnosis not present

## 2024-06-29 DIAGNOSIS — Z85118 Personal history of other malignant neoplasm of bronchus and lung: Secondary | ICD-10-CM | POA: Diagnosis not present

## 2024-06-29 DIAGNOSIS — Z8249 Family history of ischemic heart disease and other diseases of the circulatory system: Secondary | ICD-10-CM | POA: Diagnosis not present

## 2024-06-29 DIAGNOSIS — Z821 Family history of blindness and visual loss: Secondary | ICD-10-CM | POA: Diagnosis not present

## 2024-06-29 DIAGNOSIS — Z8673 Personal history of transient ischemic attack (TIA), and cerebral infarction without residual deficits: Secondary | ICD-10-CM | POA: Diagnosis not present

## 2024-06-29 DIAGNOSIS — Z7985 Long-term (current) use of injectable non-insulin antidiabetic drugs: Secondary | ICD-10-CM | POA: Diagnosis not present

## 2024-06-29 DIAGNOSIS — Z87891 Personal history of nicotine dependence: Secondary | ICD-10-CM | POA: Diagnosis not present

## 2024-06-29 DIAGNOSIS — J9601 Acute respiratory failure with hypoxia: Secondary | ICD-10-CM | POA: Diagnosis present

## 2024-06-29 LAB — PHOSPHORUS: Phosphorus: 4.4 mg/dL (ref 2.5–4.6)

## 2024-06-29 LAB — COMPREHENSIVE METABOLIC PANEL WITH GFR
ALT: 15 U/L (ref 0–44)
AST: 38 U/L (ref 15–41)
Albumin: 3.7 g/dL (ref 3.5–5.0)
Alkaline Phosphatase: 49 U/L (ref 38–126)
Anion gap: 13 (ref 5–15)
BUN: 10 mg/dL (ref 8–23)
CO2: 26 mmol/L (ref 22–32)
Calcium: 9.4 mg/dL (ref 8.9–10.3)
Chloride: 101 mmol/L (ref 98–111)
Creatinine, Ser: 0.71 mg/dL (ref 0.44–1.00)
GFR, Estimated: >60 mL/min (ref >60)
Glucose, Bld: 132 mg/dL — ABNORMAL HIGH (ref 70–99)
Potassium: 4.2 mmol/L (ref 3.5–5.1)
Sodium: 140 mmol/L (ref 135–145)
Total Bilirubin: 0.7 mg/dL (ref 0.0–1.2)
Total Protein: 7.6 g/dL (ref 6.5–8.1)

## 2024-06-29 LAB — CBC WITH DIFFERENTIAL/PLATELET
Abs Immature Granulocytes: 0.01 10*3/uL (ref 0.00–0.07)
Basophils Absolute: 0 10*3/uL (ref 0.0–0.1)
Basophils Relative: 0 %
Eosinophils Absolute: 0 10*3/uL (ref 0.0–0.5)
Eosinophils Relative: 0 %
HCT: 38.8 % (ref 36.0–46.0)
Hemoglobin: 12.6 g/dL (ref 12.0–15.0)
Immature Granulocytes: 0 %
Lymphocytes Relative: 15 %
Lymphs Abs: 0.7 10*3/uL (ref 0.7–4.0)
MCH: 28.6 pg (ref 26.0–34.0)
MCHC: 32.5 g/dL (ref 30.0–36.0)
MCV: 88 fL (ref 80.0–100.0)
Monocytes Absolute: 0.2 10*3/uL (ref 0.1–1.0)
Monocytes Relative: 5 %
Neutro Abs: 3.8 10*3/uL (ref 1.7–7.7)
Neutrophils Relative %: 80 %
Platelets: 336 10*3/uL (ref 150–400)
RBC: 4.41 MIL/uL (ref 3.87–5.11)
RDW: 14.6 % (ref 11.5–15.5)
WBC: 4.8 10*3/uL (ref 4.0–10.5)
nRBC: 0 % (ref 0.0–0.2)

## 2024-06-29 LAB — GLUCOSE, CAPILLARY
Glucose-Capillary: 126 mg/dL — ABNORMAL HIGH (ref 70–99)
Glucose-Capillary: 228 mg/dL — ABNORMAL HIGH (ref 70–99)
Glucose-Capillary: 128 mg/dL — ABNORMAL HIGH (ref 70–99)
Glucose-Capillary: 161 mg/dL — ABNORMAL HIGH (ref 70–99)

## 2024-06-29 LAB — MAGNESIUM: Magnesium: 2.8 mg/dL — ABNORMAL HIGH (ref 1.7–2.4)

## 2024-06-29 MED ORDER — DULOXETINE HCL 30 MG PO CPEP
60.0000 mg | ORAL_CAPSULE | Freq: Every day | ORAL | Status: DC
  Administered 2024-06-29 – 2024-07-04 (×6): 60 mg via ORAL
  Filled 2024-06-29 (×6): qty 2

## 2024-06-29 MED ORDER — ATORVASTATIN CALCIUM 40 MG PO TABS
80.0000 mg | ORAL_TABLET | Freq: Every day | ORAL | Status: DC
  Administered 2024-06-29 – 2024-07-04 (×6): 80 mg via ORAL
  Filled 2024-06-29 (×6): qty 2

## 2024-06-29 MED ORDER — PREGABALIN 50 MG PO CAPS
100.0000 mg | ORAL_CAPSULE | Freq: Three times a day (TID) | ORAL | Status: DC
  Administered 2024-06-29 – 2024-07-04 (×16): 100 mg via ORAL
  Filled 2024-06-29 (×16): qty 2

## 2024-06-29 MED ORDER — ASPIRIN 81 MG PO TBEC
81.0000 mg | DELAYED_RELEASE_TABLET | Freq: Every day | ORAL | Status: DC
  Administered 2024-06-29 – 2024-07-04 (×6): 81 mg via ORAL
  Filled 2024-06-29 (×6): qty 1

## 2024-06-29 MED ORDER — FAMOTIDINE 20 MG PO TABS
20.0000 mg | ORAL_TABLET | Freq: Every day | ORAL | Status: DC
  Administered 2024-06-29 – 2024-07-03 (×5): 20 mg via ORAL
  Filled 2024-06-29 (×5): qty 1

## 2024-06-29 MED ORDER — ACETAMINOPHEN 650 MG RE SUPP
650.0000 mg | Freq: Four times a day (QID) | RECTAL | Status: DC | PRN
Start: 2024-06-29 — End: 2024-07-04

## 2024-06-29 MED ORDER — BISOPROLOL FUMARATE 5 MG PO TABS
2.5000 mg | ORAL_TABLET | Freq: Every day | ORAL | Status: DC
  Administered 2024-06-29 – 2024-07-04 (×6): 2.5 mg via ORAL
  Filled 2024-06-29 (×6): qty 1

## 2024-06-29 MED ORDER — MONTELUKAST SODIUM 10 MG PO TABS
10.0000 mg | ORAL_TABLET | Freq: Every day | ORAL | Status: DC
  Administered 2024-06-29 – 2024-07-03 (×5): 10 mg via ORAL
  Filled 2024-06-29 (×5): qty 1

## 2024-06-29 MED ORDER — ONDANSETRON HCL 4 MG/2ML IJ SOLN
4.0000 mg | Freq: Four times a day (QID) | INTRAMUSCULAR | Status: DC | PRN
  Administered 2024-06-29: 4 mg via INTRAVENOUS
  Filled 2024-06-29: qty 2

## 2024-06-29 MED ORDER — ACETAMINOPHEN 325 MG PO TABS
650.0000 mg | ORAL_TABLET | Freq: Four times a day (QID) | ORAL | Status: DC | PRN
  Filled 2024-06-29: qty 2

## 2024-06-29 MED ORDER — PANTOPRAZOLE SODIUM 40 MG PO TBEC
40.0000 mg | DELAYED_RELEASE_TABLET | Freq: Every day | ORAL | Status: DC
  Administered 2024-06-29 – 2024-07-02 (×4): 40 mg via ORAL
  Filled 2024-06-29 (×4): qty 1

## 2024-06-29 MED ORDER — MELATONIN 3 MG PO TABS: 3.0000 mg | ORAL_TABLET | Freq: Every evening | ORAL | Status: DC | PRN

## 2024-06-29 MED ORDER — METHOCARBAMOL 500 MG PO TABS
500.0000 mg | ORAL_TABLET | Freq: Three times a day (TID) | ORAL | Status: DC | PRN
  Administered 2024-06-29 – 2024-07-03 (×4): 500 mg via ORAL
  Filled 2024-06-29 (×4): qty 1

## 2024-06-29 MED ORDER — IPRATROPIUM-ALBUTEROL 0.5-2.5 (3) MG/3ML IN SOLN
3.0000 mL | Freq: Four times a day (QID) | RESPIRATORY_TRACT | Status: DC
  Administered 2024-06-29 – 2024-06-30 (×8): 3 mL via RESPIRATORY_TRACT
  Filled 2024-06-29 (×8): qty 3

## 2024-06-29 MED ORDER — INSULIN ASPART 100 UNIT/ML IJ SOLN
1.0000 [IU] | Freq: Once | INTRAMUSCULAR | Status: AC
  Administered 2024-06-29: 1 [IU] via SUBCUTANEOUS

## 2024-06-29 MED ORDER — INSULIN ASPART 100 UNIT/ML IJ SOLN
0.0000 [IU] | Freq: Three times a day (TID) | INTRAMUSCULAR | Status: DC
  Administered 2024-06-29: 3 [IU] via SUBCUTANEOUS
  Administered 2024-06-30 (×2): 2 [IU] via SUBCUTANEOUS
  Administered 2024-06-30: 7 [IU] via SUBCUTANEOUS
  Administered 2024-07-01 (×2): 2 [IU] via SUBCUTANEOUS

## 2024-06-29 MED ORDER — METHYLPREDNISOLONE SODIUM SUCC 125 MG IJ SOLR
80.0000 mg | Freq: Two times a day (BID) | INTRAMUSCULAR | Status: DC
  Administered 2024-06-29 – 2024-07-04 (×11): 80 mg via INTRAVENOUS
  Filled 2024-06-29 (×11): qty 2

## 2024-06-29 NOTE — Progress Notes (Signed)
 IS education provided to patient. She is able to get to about 700 with good form.

## 2024-06-29 NOTE — Progress Notes (Signed)
 Mobility Specialist - Progress Note   06/29/24 1314  Mobility  Activity Ambulated independently in hallway  Level of Assistance Independent  Assistive Device None  Distance Ambulated (ft) 160 ft  Activity Response Tolerated well  Mobility Referral Yes  Mobility visit 1 Mobility  Mobility Specialist Start Time (ACUTE ONLY) 1255  Mobility Specialist Stop Time (ACUTE ONLY) 1312  Mobility Specialist Time Calculation (min) (ACUTE ONLY) 17 min   Pt received in bed and agreeable to mobility. Audible wheezing throughout session. Upon return, pt requested assistance to the [facility]. Pt to EOB for lunch after session with all needs met.    Pre-mobility: 100% SpO2 (RA) During mobility: 137 HR, 100% SpO2 (RA) Post-mobility: 107 HR, 100% SPO2 (RA)  Chief Technology Officer

## 2024-06-29 NOTE — Plan of Care (Signed)
  Problem: Clinical Measurements: Goal: Ability to maintain clinical measurements within normal limits will improve Outcome: Progressing Goal: Diagnostic test results will improve Outcome: Progressing Goal: Respiratory complications will improve Outcome: Progressing Goal: Cardiovascular complication will be avoided Outcome: Progressing   Problem: Metabolic: Goal: Ability to maintain appropriate glucose levels will improve Outcome: Progressing   Problem: Tissue Perfusion: Goal: Adequacy of tissue perfusion will improve Outcome: Progressing

## 2024-06-29 NOTE — Hospital Course (Signed)
 Same day note   [name] is a 78 y.o. female with medical history significant for COPD, type 2 diabetes mellitus, essential hypertension, hyperlipidemia, chronic diastolic heart failure, presented to hospital with shortness of breath cough wheezing.  She recently had a [place] cruise.  In the ED patient was afebrile but tachypneic.  Was hypoxic with pulse ox of 88% on room air.  Labs showed normal WBC.  COVID influenza RSV was negative.  EKG showed sinus tachycardia.  CT chest with PE protocol showed no evidence of acute pulmonary embolism or any evidence of infiltrate, in the ED patient received Solumedrol, DuoNeb treatment, magnesium  sulfate IV and was admitted hospital for further evaluation and treatment    Patient seen and examined at bedside.  Patient was admitted to the hospital for  At the time of my evaluation, patient complains of Physical examination reveals  Laboratory data and imaging was reviewed  Assessment and Plan.   Acute COPD exacerbation  Continue oxygen nebulizer steroids.  CTA of the chest without PE or infiltrate.  Continue flutter valve incentive spirometry.    Type 2 Diabetes Mellitus:   on Ozempic  as an outpatient.  Most recent hemoglobin A1c noted to be 6.4% . Continue  low dose SSI, continue Lyrica .    Essential Hypertension:  Continue bisoprolol .                                           #) Hyperlipidemia: documented h/o such. On atorvastatin  as outpatient.    Plan: continue home statin.                                          #) GERD: documented h/o such; on on both Pepcid  as well as Protonix  as outpatient.    Plan: continue home PPI and H2 blocker.                                            #) Chronic diastolic heart failure: documented history of such, with most recent echocardiogram performed August 2022, which was notable for LVEF 65% as well as grade 1 diastolic dysfunction. No  clinical or radiographic evidence to suggest acutely decompensated heart failure at this time. home diuretic regimen reportedly consists of the following: None.    Plan: monitor strict I's & O's and daily weights. Repeat CMP in AM. Check serum mag level.     No Charge  Signed,  Vernal Anselm Alstrom, MD [facility]

## 2024-06-29 NOTE — H&P (Signed)
 History and Physical      [name] FMW:[id number] DOB: [birth year] DOA: 06/28/2024; DOS: 06/29/2024  PCP: Jarold Medici, MD  Patient coming from: home   I have personally briefly reviewed patient's old medical records in [facility] Link  Chief Complaint: Shortness of breath  HPI: [name] is a 78 y.o. female with medical history significant for COPD, type 2 diabetes mellitus, essential hypertension, hyperlipidemia, chronic diastolic heart failure, who is admitted to [facility] on 06/28/2024 by way of transfer from [facility] with acute COPD exacerbation after presenting from home to the latter facility complaining of shortness of breath.  The patient reports 1 week of progressive shortness of breath associated with nonproductive cough, wheezing.  Denies any associated hemoptysis, nor any associated subjective fever, chills, rigors, generalized myalgias.  Her shortness of breath is not associated with any orthopnea, PND, or worsening peripheral edema.  She notes recent travel that has included a recent [place] cruise.  Has a history of COPD in the context of being a former smoker.  Denies any known baseline supplemental oxygen requirement.  Notes good compliance with her outpatient respiratory regimen which includes Breztri  inhaler.  Medical history also notable for chronic diastolic heart failure, with most recent echocardiogram occurring in August 2022, which was notable for LVEF 65%, grade 1 diastolic function, normal right ventricular systolic function and no evidence of significant valvular pathology.  On 80 diuretic medications as an outpatient.    [facility] ED Course:  Vital signs in the ED were notable for the following: Afebrile; initial heart rates in the 1 teens to 120s, subsequent decreasing into the 80s to 90s after breathing treatments; stop blood pressures in the 120s to 140s; respiratory rate 18-25, initial oxygen saturation 88% room air, subsequent  increasing into the range of 94 to 98% on 2 L nasal cannula.  Labs were notable for the following: BMP notable for the following: Bicarbonate 28, glucose 130.  BNP 78.  High-sensitivity troponin I x 2 values, which were both found to be less than 15.  CBC notable for white blood cell count 6200.  Blood cultures x 2 were collected.  COVID, influenza, RSV PCR were all negative.  Per my interpretation, EKG in ED demonstrated the following: Sinus tachycardia with heart rate 117, normal intervals, no evidence of T wave or ST changes, including no evidence of ST elevation.  Imaging in the ED, per corresponding formal radiology read, was notable for the following: CT chest with PE protocol showed no evidence of acute pulmonary embolism or any evidence of infiltrate, Dema, effusion, or pneumothorax, will demonstrating chronic eczematous changes.  While in the ED, the following were administered: Solumedrol, DuoNeb Iser treatment, patient on nebulizer treatment, magnesium  sulfate 2 g IV every 2 hours.  Subsequently, the patient was admitted to [facility] for further evaluation management of acute COPD exacerbation complicated by acute hypoxic respiratory distress.    Review of Systems: As per HPI otherwise 10 point review of systems negative.   Past Medical History:  Diagnosis Date   Allergy  09/30/1996   Arthritis    Asthma    Complication of anesthesia    COPD (chronic obstructive pulmonary disease) (HCC)    Diabetes (HCC)    High cholesterol    History of radiation therapy    Right Lung- 12/04/21-12/11/21- Dr. Lynwood Nasuti   Hypertension    Lung cancer [facility])    Neuromuscular disorder (HCC)    Pneumonia 09/10/2017   PONV (postoperative nausea and  vomiting)    Stroke [facility]) 2007   mini strokes    Past Surgical History:  Procedure Laterality Date   ABDOMINAL HYSTERECTOMY  1995   BRONCHIAL BIOPSY  10/23/2021   Procedure: BRONCHIAL BIOPSIES;  Surgeon: Brenna Adine CROME, DO;  Location: [facility]  ENDOSCOPY;  Service: Pulmonary;;   BRONCHIAL BRUSHINGS  10/23/2021   Procedure: BRONCHIAL BRUSHINGS;  Surgeon: Brenna Adine CROME, DO;  Location: [facility] ENDOSCOPY;  Service: Pulmonary;;   BRONCHIAL NEEDLE ASPIRATION BIOPSY  10/23/2021   Procedure: BRONCHIAL NEEDLE ASPIRATION BIOPSIES;  Surgeon: Brenna Adine CROME, DO;  Location: [facility] ENDOSCOPY;  Service: Pulmonary;;   carpel tunnel surgery Right 2006   COLONOSCOPY  2014   COLOSTOMY  2007   colostomy let down  2008   endoscopy  2014   FIDUCIAL MARKER PLACEMENT  10/23/2021   Procedure: FIDUCIAL MARKER PLACEMENT;  Surgeon: Brenna Adine CROME, DO;  Location: [facility] ENDOSCOPY;  Service: Pulmonary;;   rotator cuff tear Left 04/14/2020   TUBAL LIGATION     VIDEO BRONCHOSCOPY WITH ENDOBRONCHIAL NAVIGATION Right 10/23/2021   Procedure: VIDEO BRONCHOSCOPY WITH ENDOBRONCHIAL NAVIGATION;  Surgeon: Brenna Adine CROME, DO;  Location: [facility] ENDOSCOPY;  Service: Pulmonary;  Laterality: Right;  ION, w/ fiducial placement   VIDEO BRONCHOSCOPY WITH RADIAL ENDOBRONCHIAL ULTRASOUND  10/23/2021   Procedure: VIDEO BRONCHOSCOPY WITH RADIAL ENDOBRONCHIAL ULTRASOUND;  Surgeon: Brenna Adine CROME, DO;  Location: [facility] ENDOSCOPY;  Service: Pulmonary;;    Social History:  reports that she quit smoking about 18 years ago. Her smoking use included cigarettes. She started smoking about 57 years ago. She has a 21 pack-year smoking history. She has never used smokeless tobacco. She reports that she does not currently use alcohol. She reports that she does not use drugs.   Allergies  Allergen Reactions   Other Swelling    HAIR DYE    Family History  Problem Relation Age of Onset   Heart murmur Mother    Heart attack Father    Diabetes Father    Diabetes Brother    Stroke Brother    Vision loss Brother     Family history reviewed and not pertinent    Prior to Admission medications   Medication Sig Start Date End Date Taking? Authorizing Provider  albuterol  (PROVENTIL ) (2.5 MG/3ML)  0.083% nebulizer solution Take 3 mLs (2.5 mg total) by nebulization every 4 (four) hours as needed. 03/17/24   Darlean Ozell NOVAK, MD  albuterol  (VENTOLIN  HFA) 108 (90 Base) MCG/ACT inhaler INHALE 2 PUFFS INTO THE LUNGS EVERY 4 HOURS AS NEEDED FOR WHEEZING OR SHORTNESS OF BREATH 01/14/24   Jarold Medici, MD  aspirin  81 MG tablet Take 81 mg by mouth daily.    [provider]  atorvastatin  (LIPITOR) 80 MG tablet TAKE 1 TABLET BY MOUTH DAILY ON MONDAY-FRIDAY. 03/09/24   Jarold Medici, MD  Biotin 89999 MCG TBDP Take 10,000 mcg by mouth daily.    [provider]  bisoprolol  (ZEBETA ) 5 MG tablet TAKE 1/2 TABLET BY MOUTH EVERY DAY 12/04/23   Jarold Medici, MD  Budeson-Glycopyrrol-Formoterol  (BREZTRI  AEROSPHERE) 160-9-4.8 MCG/ACT AERO Inhale 2 puffs into the lungs 2 (two) times daily. 12/11/23   Jarold Medici, MD  CALCIUM -MAGNESIUM -ZINC PO Take 1 capsule by mouth at bedtime. Patient not taking: Reported on 05/31/2024    [provider]  Cholecalciferol (VITAMIN D3) 2000 UNITS TABS Take 2,000 Units by mouth at bedtime.    [provider]  DULoxetine  (CYMBALTA ) 60 MG capsule TAKE 1 CAPSULE(60 MG) BY MOUTH DAILY 02/02/24  Jarold Medici, MD  Evolocumab  (REPATHA  SURECLICK) 140 MG/ML SOAJ Inject 140 mg into the skin every 14 (fourteen) days. 12/29/23   Hilty, Vinie BROCKS, MD  famotidine  (PEPCID ) 20 MG tablet Take 1 tablet (20 mg total) by mouth daily after supper. 10/11/23   Olalere, Adewale A, MD  glucose blood (ACCU-CHEK GUIDE) test strip 1 each by Other route daily. Use as instructed to check blood sugars 1 time per day dx: e11.22 09/10/19   Jarold Medici, MD  Magnesium  250 MG TABS Take 1 tablet by mouth daily.    [provider]  montelukast  (SINGULAIR ) 10 MG tablet TAKE 1 TABLET(10 MG) BY MOUTH AT BEDTIME 10/31/23   Olalere, Adewale A, MD  oxyCODONE -acetaminophen  (PERCOCET) 10-325 MG tablet Take 1 tablet by mouth every 4 (four) hours as needed. 04/29/24   Darlean Ozell NOVAK, MD   pantoprazole  (PROTONIX ) 40 MG tablet TAKE 1 TABLET BY MOUTH 30 MINUTES BEFORE FIRST MEAL OF THE DAY 02/09/24   Jarold Medici, MD  predniSONE  (DELTASONE ) 10 MG tablet 2 daily  until better, then 1 daily x 5 days and stop Patient not taking: Reported on 05/31/2024 03/17/24   Darlean Ozell NOVAK, MD  pregabalin  (LYRICA ) 100 MG capsule Take 1 capsule (100 mg total) by mouth 3 (three) times daily. 02/18/24   Jarold Medici, MD  Semaglutide , 1 MG/DOSE, (OZEMPIC , 1 MG/DOSE,) 4 MG/3ML SOPN Inject 1 mg into the skin once a week. Friday 04/23/22   Jarold Medici, MD     Objective    Physical Exam: Vitals:   06/29/24 0000 06/29/24 0030 06/29/24 0100 06/29/24 0141  BP: 133/63 137/62 139/62 134/84  Pulse: 97 87 85 91  Resp:   20 (!) 22  Temp:    97.9 F (36.6 C)  TempSrc:      SpO2: 93% 94% 96% 98%  Weight:      Height:        General: appears to be stated age; alert, oriented; the increased work of breathing Skin: warm, dry, no rash Head:  AT/[place] Mouth:  Oral mucosa membranes appear moist, normal dentition Neck: supple; trachea midline Heart:  RRR; did not appreciate any M/R/G Lungs: Bilateral wheezes noted ;  Abdomen: + BS; soft, ND, NT Vascular: 2+ pedal pulses b/l; 2+ radial pulses b/l Extremities: no peripheral edema, no muscle wasting Neuro: strength and sensation intact in upper and lower extremities b/l    Labs on Admission: I have personally reviewed following labs and imaging studies  CBC: Recent Labs  Lab 06/28/24 1654  WBC 6.2  HGB 12.8  HCT 39.0  MCV 87.4  PLT 334   Basic Metabolic Panel: Recent Labs  Lab 06/28/24 1654  NA 136  K 4.0  CL 97*  CO2 28  GLUCOSE 130*  BUN 9  CREATININE 0.85  CALCIUM  9.4   GFR: Estimated Creatinine Clearance: 61.5 mL/min (by C-G formula based on SCr of 0.85 mg/dL). Liver Function Tests: No results for input(s): AST, ALT, ALKPHOS, BILITOT, PROT, ALBUMIN in the last 168 hours. No results for input(s): LIPASE,  AMYLASE in the last 168 hours. No results for input(s): AMMONIA in the last 168 hours. Coagulation Profile: No results for input(s): INR, PROTIME in the last 168 hours. Cardiac Enzymes: No results for input(s): CKTOTAL, CKMB, CKMBINDEX, TROPONINI in the last 168 hours. BNP (last 3 results) Recent Labs    06/28/24 1654  PROBNP 78.9   HbA1C: No results for input(s): HGBA1C in the last 72 hours. CBG: No results for input(s):  GLUCAP in the last 168 hours. Lipid Profile: No results for input(s): CHOL, HDL, LDLCALC, TRIG, CHOLHDL, LDLDIRECT in the last 72 hours. Thyroid  Function Tests: No results for input(s): TSH, T4TOTAL, FREET4, T3FREE, THYROIDAB in the last 72 hours. Anemia Panel: No results for input(s): VITAMINB12, FOLATE, FERRITIN, TIBC, IRON, RETICCTPCT in the last 72 hours. Urine analysis:    Component Value Date/Time   COLORURINE STRAW (A) 09/10/2017 0844   APPEARANCEUR CLEAR 09/10/2017 0844   LABSPEC 1.006 09/10/2017 0844   PHURINE 5.0 09/10/2017 0844   GLUCOSEU 50 (A) 09/10/2017 0844   HGBUR NEGATIVE 09/10/2017 0844   BILIRUBINUR NEGATIVE 02/18/2024 1210   KETONESUR 5 (A) 09/10/2017 0844   PROTEINUR Negative 02/18/2024 1210   PROTEINUR NEGATIVE 09/10/2017 0844   UROBILINOGEN 0.2 02/18/2024 1210   NITRITE NEGATIVE 02/18/2024 1210   NITRITE NEGATIVE 09/10/2017 0844   LEUKOCYTESUR Negative 02/18/2024 1210    Radiological Exams on Admission: CT Angio Chest PE W/Cm &/Or Wo Cm Result Date: 06/28/2024 CLINICAL DATA:  PE suspected. Shortness of breath with cough. Recent travel. History of COPD EXAM: CT ANGIOGRAPHY CHEST WITH CONTRAST TECHNIQUE: Multidetector CT imaging of the chest was performed using the standard protocol during bolus administration of intravenous contrast. Multiplanar CT image reconstructions and MIPs were obtained to evaluate the vascular anatomy. RADIATION DOSE REDUCTION: This exam was performed  according to the departmental dose-optimization program which includes automated exposure control, adjustment of the mA and/or kV according to patient size and/or use of iterative reconstruction technique. CONTRAST:  OMNIPAQUE  IOHEXOL  350 MG/ML SOLN COMPARISON:  Same day radiograph and CT chest 04/08/2024 FINDINGS: Cardiovascular: No pericardial effusion. Normal caliber thoracic aorta without dissection. Coronary artery and aortic atherosclerotic calcification. Negative for acute pulmonary embolism. Mediastinum/Nodes: Similar soft tissue fullness in the upper right hilar region measuring 12 mm, previously 13 mm. Inferior index right hilar lymph node measures 13 mm, previously 10 mm. Trachea and esophagus are unremarkable. Lungs/Pleura: Centrilobular and paraseptal emphysema. Post treatment scarring in the suprahilar posterior right lung about a fiducial marker is not substantially changed from 04/08/2024. 5 mm perifissural nodule in the right middle lobe is unchanged. New 3 mm nodule in the anterior left upper lobe on series 6/image 51. No pleural effusion or pneumothorax. Upper Abdomen: No acute abnormality. Musculoskeletal: No acute fracture. Review of the MIP images confirms the above findings. IMPRESSION: 1. Negative for acute pulmonary embolism. 2. Post treatment scarring in the suprahilar posterior right lung is not substantially changed from 04/08/2024. 3. Similar soft tissue fullness in the upper right hilar region. Inferior index right hilar lymph node measures 13 mm, previously 10 mm. 4. New 3 mm nodule in the anterior left upper lobe. Stable 5 mm perifissural nodule in the right middle lobe. Aortic Atherosclerosis (ICD10-I70.0) and Emphysema (ICD10-J43.9). Electronically Signed   By: Norman Gatlin M.D.   On: 06/28/2024 21:28   DG Chest Port 1 View Result Date: 06/28/2024 CLINICAL DATA:  Shortness of breath EXAM: PORTABLE CHEST 1 VIEW COMPARISON:  12/11/2022, CT 04/08/2024 FINDINGS:  Hyperinflation with emphysema. Distortion in the right suprahilar lung with fiducial marker. Pleuroparenchymal disease and scarring at the left base, question increased pleural density, possible effusion. Stable cardiomediastinal silhouette with aortic atherosclerosis IMPRESSION: 1. Hyperinflation with emphysema. Pleuroparenchymal disease and scarring at the left base, question increased pleural density, possible effusion. 2. Distortion in the right suprahilar lung with fiducial marker, reference chest CT 04/08/2024. Electronically Signed   By: Luke Bun M.D.   On: 06/28/2024 17:16  Assessment/Plan    Principal Problem:   COPD with acute exacerbation (HCC) Active Problems:   SOB (shortness of breath)   Essential hypertension   DM2 (diabetes mellitus, type 2) (HCC)   Acute hypoxic respiratory failure (HCC)   HLD (hyperlipidemia)   GERD (gastroesophageal reflux disease)   Chronic diastolic CHF (congestive heart failure) (HCC)     #) Acute COPD exacerbation resulting in acute hypoxic respiratory distress: In setting of a documented history of COPD for which she is a former smoker, diagnosis of acute exacerbation of the basis of: Progressive shortness of breath and increased work of breathing, associated with wheezing, and new supplemental oxygen requirement, with initial oxygen saturation in the high 80s on room air, [place] improving into the mid 90s on 2 L nasal cannula, meeting criteria for acute hypoxic respiratory distress as opposed to acute hypoxic respiratory failure.  Etiology for her acute exacerbation is not entirely clear at this time, although she did recently travel on a cruise to [place] , precipitating her to exposure to various potential respiratory viruses.  CTA chest which shows no evidence of acute cardiopulmonary process, clearance of acute pulmonary embolism or any evidence of infiltrate.  No clinical or radiographic evidence to suggest acute decompensated heart failure.   ACS appears less likely in the absence of any recent chest pain, will high sensitive troponin I x 2 values were nonelevated and EKG shows no evidence of acute ischemic changes.  COVID, influenza, RSV PCR were all negative.  Plan: Solu-Medrol , scheduled DuoNebs, prn albuterol  nebulizers.  Flutter valve, incentive spirometry.  Check magnesium  and phosphorus levels.                      #) Type 2 Diabetes Mellitus: documented history of such. Home insulin  regimen: None. Home oral hypoglycemic agents: None.  Rather, she is on Ozempic  as an outpatient.  Her history of diabetes is complicated by history of diabetic peripheral polyneuropathy for which she is on Lyrica  at home.  Presenting blood sugar: 130. Most recent A1c noted to be 6.4% when checked on 05/31/2024.  Plan: accuchecks QAC and HS with low dose SSI.  Resume home Lyrica .  Hold home Ozempic  during this hospitalization.                  #) Essential Hypertension: documented h/o such, with outpatient antihypertensive regimen including bisoprolol .  SBP's in the ED today: 120s 140s mmHg.   Plan: Close monitoring of subsequent BP via routine VS. resuming beta-blocker.                      #) Hyperlipidemia: documented h/o such. On atorvastatin  as outpatient.   Plan: continue home statin.                      #) GERD: documented h/o such; on on both Pepcid  as well as Protonix  as outpatient.   Plan: continue home PPI and H2 blocker.                       #) Chronic diastolic heart failure: documented history of such, with most recent echocardiogram performed August 2022, which was notable for LVEF 65% as well as grade 1 diastolic dysfunction. No clinical or radiographic evidence to suggest acutely decompensated heart failure at this time. home diuretic regimen reportedly consists of the following: None.   Plan: monitor strict I's & O's and daily weights.  Repeat CMP  in AM. Check serum mag level.      DVT prophylaxis: SCD's   Code Status: Full code Family Communication: none Disposition Plan: Per Rounding Team Consults called: none;  Admission status: Inpatient    I SPENT GREATER THAN 75  MINUTES IN CLINICAL CARE TIME/MEDICAL DECISION-MAKING IN COMPLETING THIS ADMISSION.     Reign Bartnick B Elfreida Heggs DO [facility] From 7PM - 7AM   06/29/2024, 1:54 AM

## 2024-06-29 NOTE — Progress Notes (Addendum)
 Same day note   [name] is a 78 y.o. female with medical history significant for COPD, type 2 diabetes mellitus, essential hypertension, hyperlipidemia, chronic diastolic heart failure, presented to hospital with shortness of breath cough wheezing.  She recently had a [place] cruise.  In the ED patient was afebrile but tachypneic.  Was hypoxic with pulse ox of 88% on room air.  Labs showed normal WBC.  COVID influenza RSV was negative.  EKG showed sinus tachycardia.  CT chest with PE protocol showed no evidence of acute pulmonary embolism or any evidence of infiltrate, in the ED patient received Solumedrol, DuoNeb treatment, magnesium  sulfate IV and was admitted hospital for further evaluation and treatment    Patient seen and examined at bedside.  Patient was admitted to the hospital for cough shortness of breath and dyspnea.  Complains of dyspnea on exertion.  At the time of my evaluation, patient complains of feeling a little better since the morning but still with cough congestion wheezing and shortness of breath.  Physical examination reveals elderly female with cough congestion and lungs with rhonchi and coarse breath sounds.  Laboratory data and imaging was reviewed  Assessment and Plan.   Acute COPD exacerbation  Planes of shortness of breath and dyspnea on exertion.  Usually gets 1 episode of flareup per year.  Recently had been on [place] cruise.  Continue oxygen nebulizer and IV steroids.  CTA of the chest without PE or infiltrate.  Continue flutter valve incentive spirometry.  On 2 L of oxygen by nasal cannula.  Will continue to wean as able.   Type 2 Diabetes Mellitus:   on Ozempic  as an outpatient.  Most recent hemoglobin A1c noted to be 6.4% . Continue  low dose SSI, continue Lyrica .    Essential Hypertension:  Continue bisoprolol .     Hyperlipidemia:  continue statin.    GERD: continue home PPI and H2 blocker.   Chronic diastolic heart failure: Most recent 2D  echocardiogram showing LVEF 65% as well as grade 1 diastolic dysfunction.  Currently compensated.  Continue intake and output charting Daily weights.  Possible disposition home by 06/30/2024 if continues to improve.  No Charge  Signed,  Vernal Anselm Alstrom, MD [facility]

## 2024-06-29 NOTE — Plan of Care (Signed)

## 2024-06-30 DIAGNOSIS — J441 Chronic obstructive pulmonary disease with (acute) exacerbation: Secondary | ICD-10-CM | POA: Diagnosis not present

## 2024-06-30 LAB — BASIC METABOLIC PANEL WITH GFR
Anion gap: 10 (ref 5–15)
BUN: 17 mg/dL (ref 8–23)
CO2: 27 mmol/L (ref 22–32)
Calcium: 9.1 mg/dL (ref 8.9–10.3)
Chloride: 99 mmol/L (ref 98–111)
Creatinine, Ser: 0.74 mg/dL (ref 0.44–1.00)
GFR, Estimated: >60 mL/min (ref >60)
Glucose, Bld: 180 mg/dL — ABNORMAL HIGH (ref 70–99)
Potassium: 4.6 mmol/L (ref 3.5–5.1)
Sodium: 136 mmol/L (ref 135–145)

## 2024-06-30 LAB — GLUCOSE, CAPILLARY
Glucose-Capillary: 166 mg/dL — ABNORMAL HIGH (ref 70–99)
Glucose-Capillary: 309 mg/dL — ABNORMAL HIGH (ref 70–99)
Glucose-Capillary: 158 mg/dL — ABNORMAL HIGH (ref 70–99)
Glucose-Capillary: 228 mg/dL — ABNORMAL HIGH (ref 70–99)

## 2024-06-30 LAB — CBC
HCT: 38.3 % (ref 36.0–46.0)
Hemoglobin: 12.2 g/dL (ref 12.0–15.0)
MCH: 28.5 pg (ref 26.0–34.0)
MCHC: 31.9 g/dL (ref 30.0–36.0)
MCV: 89.5 fL (ref 80.0–100.0)
Platelets: 362 10*3/uL (ref 150–400)
RBC: 4.28 MIL/uL (ref 3.87–5.11)
RDW: 14.8 % (ref 11.5–15.5)
WBC: 9.9 10*3/uL (ref 4.0–10.5)
nRBC: 0 % (ref 0.0–0.2)

## 2024-06-30 LAB — MAGNESIUM: Magnesium: 2.6 mg/dL — ABNORMAL HIGH (ref 1.7–2.4)

## 2024-06-30 MED ORDER — BUDESON-GLYCOPYRROL-FORMOTEROL 160-9-4.8 MCG/ACT IN AERO
2.0000 | INHALATION_SPRAY | Freq: Two times a day (BID) | RESPIRATORY_TRACT | Status: DC
  Administered 2024-07-01 – 2024-07-02 (×3): 2 via RESPIRATORY_TRACT
  Filled 2024-06-30: qty 5.9

## 2024-06-30 MED ORDER — IPRATROPIUM-ALBUTEROL 0.5-2.5 (3) MG/3ML IN SOLN
3.0000 mL | Freq: Four times a day (QID) | RESPIRATORY_TRACT | Status: DC
  Administered 2024-07-01 – 2024-07-02 (×7): 3 mL via RESPIRATORY_TRACT
  Filled 2024-06-30 (×7): qty 3

## 2024-06-30 MED ORDER — ALBUTEROL SULFATE (2.5 MG/3ML) 0.083% IN NEBU
2.5000 mg | INHALATION_SOLUTION | RESPIRATORY_TRACT | Status: DC | PRN
  Administered 2024-07-01: 2.5 mg via RESPIRATORY_TRACT
  Filled 2024-06-30: qty 3

## 2024-06-30 NOTE — Progress Notes (Addendum)
   06/30/24 1255  TOC Brief Assessment  Insurance and Status Reviewed  Patient has primary care physician Yes  Home environment has been reviewed home w/ spouse  Prior level of function: independent  Prior/Current Home Services No current home services  Social Drivers of Health Review SDOH reviewed no interventions necessary  Readmission risk has been reviewed Yes  Transition of care needs no transition of care needs at this time   Following for O2 needs

## 2024-06-30 NOTE — Plan of Care (Signed)
   Problem: Education: Goal: Knowledge of General Education information will improve Description Including pain rating scale, medication(s)/side effects and non-pharmacologic comfort measures Outcome: Progressing   Problem: Clinical Measurements: Goal: Ability to maintain clinical measurements within normal limits will improve Outcome: Progressing   Problem: Activity: Goal: Risk for activity intolerance will decrease Outcome: Progressing

## 2024-06-30 NOTE — Plan of Care (Signed)
  Problem: Education: Goal: Knowledge of General Education information will improve Description: Including pain rating scale, medication(s)/side effects and non-pharmacologic comfort measures Outcome: Progressing   Problem: Activity: Goal: Risk for activity intolerance will decrease Outcome: Progressing   Problem: Nutrition: Goal: Adequate nutrition will be maintained Outcome: Progressing   Problem: Coping: Goal: Level of anxiety will decrease Outcome: Progressing   Problem: Pain Managment: Goal: General experience of comfort will improve and/or be controlled Outcome: Progressing   Problem: Safety: Goal: Ability to remain free from injury will improve Outcome: Progressing   Problem: Coping: Goal: Ability to adjust to condition or change in health will improve Outcome: Progressing

## 2024-06-30 NOTE — Progress Notes (Signed)
 Progress Note   Patient: [name] FMW:[id number] DOB: 06/17/1946 DOA: 06/28/2024     1 DOS: the patient was seen and examined on 06/30/2024   Brief hospital course: 77yo with h/o COPD, DM, HTN, HLD, prior lung cancer (completed radiation in 2022), and chronic diastolic dysfunction who presented on 6/30 with SOB.  She was treated for a COPD exacerbation.    Assessment and Plan:  Acute COPD exacerbation  Patient with new O2 requirement, significant wheezing despite current treatments Chest x-ray/CTA is not consistent with pneumonia Nebulizers: scheduled Duoneb and prn albuterol  Solu-Medrol  80 mg IV BID -> Prednisone  when better Continue Breztri , Singulair  -Coordinated care with [facility] team/PT/OT/Nutrition/RT consults   HTN Continue bisoprolol  She is a slow acetylator and so is at increased risk of side effects from hydralazine ; will cover with prn IV metoprolol    HLD Continue atorvastatin  Also on Repatha  as an outpatient   H/o lung cancer Stage 1 adenocarcinoma Completed radiation therapy in 11/2021 No obvious recurrence on current imaging   DM Recent A1c 56.4, good control Not on medications other than Ozempic  Will cover with moderate-scale SSI Continue pregabalin    Chronic pain I have reviewed this patient in the [place] Controlled Substances Reporting System.  She is receiving medications from only one provider and appears to be taking them as prescribed. She is not at particularly high risk of overdose but is at increased risk of opioid misuse or diversion.  Continue Lyrica , duloxetine   Chronic HFpEF Most recent echo was in 07/2021 Appears compensated Continue ASA           Consultants: None   Procedures: None   Antibiotics: None   30 Day Unplanned Readmission Risk Score    Flowsheet Row ED to Hosp-Admission (Current) from 06/28/2024 in [facility]  30 Day Unplanned Readmission Risk Score (%) 13.82 Filed at  06/30/2024 1600    This score is the patient's risk of an unplanned readmission within 30 days of being discharged (0 -100%). The score is based on dignosis, age, lab data, medications, orders, and past utilization.   Low:  0-14.9   Medium: 15-21.9   High: 22-29.9   Extreme: 30 and above           Subjective: Continues to wheeze despite current treatments.  Very tight with ambulation.  O2 sats appear to be appropriate but she was placed back on [place] O2 (?for wheezing).   Objective: Vitals:   06/30/24 1117 06/30/24 1459  BP: 129/68   Pulse: 95   Resp: 18   Temp: 97.6 F (36.4 C)   SpO2: 96% 96%    Intake/Output Summary (Last 24 hours) at 06/30/2024 1709 Last data filed at 06/29/2024 1904 Gross per 24 hour  Intake 240 ml  Output --  Net 240 ml   Filed Weights   06/28/24 1627  Weight: 84 kg    Exam:  General:  Appears calm and comfortable and is in NAD, at rest, on [place] O2 Eyes:  normal lids, iris ENT:  grossly normal hearing, lips & tongue, mmm Cardiovascular:  RRR, no m/r/g. No LE edema.  Respiratory:   diffuse wheezing,  Normal respiratory effort on [place] O2. Abdomen:  soft, NT, ND Skin:  no rash or induration seen on limited exam Musculoskeletal:  grossly normal tone BUE/BLE, good ROM, no bony abnormality Psychiatric:  grossly normal mood and affect, speech fluent and appropriate, AOx3 Neurologic:  CN 2-12 grossly intact, moves all extremities in coordinated fashion  Data Reviewed: I have reviewed the patient's lab results since admission.  Pertinent labs for today include:   Glucose 180 Normal CBC     Family Communication: None present  Disposition: Status is: Inpatient Remains inpatient appropriate because: ongoing management     Time spent: 50 minutes  Unresulted Labs (From admission, onward)     Start     Ordered   07/01/24 0500  CBC with Differential/Platelet  Tomorrow morning,   R       Question:  Specimen collection method  Answer:  Lab=Lab  collect   06/30/24 1709   07/01/24 0500  Basic metabolic panel with GFR  Tomorrow morning,   R       Question:  Specimen collection method  Answer:  Lab=Lab collect   06/30/24 1709             Author: Delon Herald, MD 06/30/2024 5:09 PM  For on call review [email or web address].

## 2024-07-01 DIAGNOSIS — J441 Chronic obstructive pulmonary disease with (acute) exacerbation: Secondary | ICD-10-CM | POA: Diagnosis not present

## 2024-07-01 LAB — BASIC METABOLIC PANEL WITH GFR
Anion gap: 6 (ref 5–15)
BUN: 21 mg/dL (ref 8–23)
CO2: 30 mmol/L (ref 22–32)
Calcium: 8.7 mg/dL — ABNORMAL LOW (ref 8.9–10.3)
Chloride: 101 mmol/L (ref 98–111)
Creatinine, Ser: 0.85 mg/dL (ref 0.44–1.00)
GFR, Estimated: >60 mL/min (ref >60)
Glucose, Bld: 235 mg/dL — ABNORMAL HIGH (ref 70–99)
Potassium: 5 mmol/L (ref 3.5–5.1)
Sodium: 137 mmol/L (ref 135–145)

## 2024-07-01 LAB — CBC WITH DIFFERENTIAL/PLATELET
Abs Immature Granulocytes: 0.08 10*3/uL — ABNORMAL HIGH (ref 0.00–0.07)
Basophils Absolute: 0 10*3/uL (ref 0.0–0.1)
Basophils Relative: 0 %
Eosinophils Absolute: 0 10*3/uL (ref 0.0–0.5)
Eosinophils Relative: 0 %
HCT: 36.6 % (ref 36.0–46.0)
Hemoglobin: 11.7 g/dL — ABNORMAL LOW (ref 12.0–15.0)
Immature Granulocytes: 1 %
Lymphocytes Relative: 5 %
Lymphs Abs: 0.5 10*3/uL — ABNORMAL LOW (ref 0.7–4.0)
MCH: 28.6 pg (ref 26.0–34.0)
MCHC: 32 g/dL (ref 30.0–36.0)
MCV: 89.5 fL (ref 80.0–100.0)
Monocytes Absolute: 0.2 10*3/uL (ref 0.1–1.0)
Monocytes Relative: 2 %
Neutro Abs: 9.8 10*3/uL — ABNORMAL HIGH (ref 1.7–7.7)
Neutrophils Relative %: 92 %
Platelets: 388 10*3/uL (ref 150–400)
RBC: 4.09 MIL/uL (ref 3.87–5.11)
RDW: 14.8 % (ref 11.5–15.5)
WBC: 10.6 10*3/uL — ABNORMAL HIGH (ref 4.0–10.5)
nRBC: 0 % (ref 0.0–0.2)

## 2024-07-01 LAB — GLUCOSE, CAPILLARY
Glucose-Capillary: 198 mg/dL — ABNORMAL HIGH (ref 70–99)
Glucose-Capillary: 196 mg/dL — ABNORMAL HIGH (ref 70–99)
Glucose-Capillary: 326 mg/dL — ABNORMAL HIGH (ref 70–99)
Glucose-Capillary: 190 mg/dL — ABNORMAL HIGH (ref 70–99)

## 2024-07-01 MED ORDER — INSULIN ASPART 100 UNIT/ML IJ SOLN
0.0000 [IU] | Freq: Every day | INTRAMUSCULAR | Status: DC
  Administered 2024-07-02 – 2024-07-03 (×2): 2 [IU] via SUBCUTANEOUS

## 2024-07-01 MED ORDER — INSULIN ASPART 100 UNIT/ML IJ SOLN: 3.0000 [IU] | Freq: Three times a day (TID) | INTRAMUSCULAR | Status: DC

## 2024-07-01 MED ORDER — INSULIN ASPART 100 UNIT/ML IJ SOLN: 0.0000 [IU] | Freq: Three times a day (TID) | INTRAMUSCULAR | Status: DC

## 2024-07-01 MED ORDER — INSULIN ASPART 100 UNIT/ML IJ SOLN
0.0000 [IU] | Freq: Three times a day (TID) | INTRAMUSCULAR | Status: DC
  Administered 2024-07-01: 11 [IU] via SUBCUTANEOUS
  Administered 2024-07-02: 2 [IU] via SUBCUTANEOUS
  Administered 2024-07-02 (×2): 5 [IU] via SUBCUTANEOUS
  Administered 2024-07-03: 11 [IU] via SUBCUTANEOUS
  Administered 2024-07-03: 5 [IU] via SUBCUTANEOUS
  Administered 2024-07-03: 3 [IU] via SUBCUTANEOUS
  Administered 2024-07-04: 15 [IU] via SUBCUTANEOUS

## 2024-07-01 NOTE — Progress Notes (Signed)
 Pt requested to ambulate in hallway with RN. RN instructed to patient about monitoring oxygen saturation. Pt ambulated over 200 ft, audible wheezing but denied SOB or chest pain but patient did require oxygen.   Oxygen saturation at room air was 95%  Oxygen saturation on ambulation without oxygen 77%  Oxygen saturation on ambulation with oxygen 97%

## 2024-07-01 NOTE — Plan of Care (Signed)
  Problem: Clinical Measurements: Goal: Ability to maintain clinical measurements within normal limits will improve Outcome: Progressing Goal: Diagnostic test results will improve Outcome: Progressing Goal: Respiratory complications will improve Outcome: Progressing Goal: Cardiovascular complication will be avoided Outcome: Progressing   Problem: Metabolic: Goal: Ability to maintain appropriate glucose levels will improve Outcome: Progressing

## 2024-07-01 NOTE — Inpatient Diabetes Management (Signed)
 Inpatient Diabetes Program Recommendations  AACE/ADA: New Consensus Statement on Inpatient Glycemic Control (2015)  Target Ranges:  Prepandial:   less than 140 mg/dL      Peak postprandial:   less than 180 mg/dL (1-2 hours)      Critically ill patients:  140 - 180 mg/dL   Lab Results  Component Value Date   GLUCAP 198 (H) 07/01/2024   HGBA1C 6.4 (H) 05/31/2024    Review of Glycemic Control  Latest Reference Range & Units 06/30/24 07:30 06/30/24 11:15 06/30/24 16:37 06/30/24 21:05 07/01/24 07:38  Glucose-Capillary 70 - 99 mg/dL 833 (H) 690 (H) 841 (H) 228 (H) 198 (H)   Diabetes history: DM 2 Outpatient Diabetes medications: Ozempic  1 mg QSunday Current orders for Inpatient glycemic control:  Novolog  0-9 units tid A1c 6.4% on 6/2 Solumedrol 80 mg Q12  Inpatient Diabetes Program Recommendations:    Note glucose trends after PO intake and steroid dose -   Add Novolog  3 units tid if eating >50% of meals -   Add Novolog  hs scale  Thanks,  Clotilda Bull RN, MSN, BC-ADM Inpatient Diabetes Coordinator Team Pager [phone] (8a-5p)

## 2024-07-01 NOTE — Progress Notes (Addendum)
 Progress Note   Patient: [name] FMW:[id number] DOB: 14-Sep-1946 DOA: 06/28/2024     2 DOS: the patient was seen and examined on 07/01/2024   Brief hospital course: 78yo with h/o COPD, DM, HTN, HLD, prior lung cancer (completed radiation in 2022), and chronic diastolic dysfunction who presented on 6/30 with SOB.  She was treated for a COPD exacerbation.    Assessment and Plan:  Acute COPD exacerbation  Patient with new O2 requirement, significant wheezing despite current treatments Chest x-ray/CTA is not consistent with pneumonia Nebulizers: scheduled Duoneb and prn albuterol  Solu-Medrol  80 mg IV BID -> Prednisone  when better Continue Breztri , Singulair    HTN Continue bisoprolol  She is a slow acetylator and so is at increased risk of side effects from hydralazine ; will cover with prn IV metoprolol    HLD Continue atorvastatin  Also on Repatha  as an outpatient   H/o lung cancer Stage 1 adenocarcinoma Completed radiation therapy in 11/2021 No obvious recurrence on current imaging   DM Recent A1c 6.4, good control Not on medications other than Ozempic  Will cover with moderate-scale SSI Continue pregabalin    Chronic pain I have reviewed this patient in the [place] Controlled Substances Reporting System.  She is receiving medications from only one provider and appears to be taking them as prescribed. She is not at particularly high risk of overdose but is at increased risk of opioid misuse or diversion.  Continue Lyrica , duloxetine    Chronic HFpEF Most recent echo was in 07/2021 Appears compensated Continue ASA           Consultants: None   Procedures: None   Antibiotics: None  30 Day Unplanned Readmission Risk Score    Flowsheet Row ED to Hosp-Admission (Current) from 06/28/2024 in [facility]  30 Day Unplanned Readmission Risk Score (%) 17.87 Filed at 07/01/2024 1600    This score is the patient's risk of an unplanned  readmission within 30 days of being discharged (0 -100%). The score is based on dignosis, age, lab data, medications, orders, and past utilization.   Low:  0-14.9   Medium: 15-21.9   High: 22-29.9   Extreme: 30 and above           Subjective: She was having a coughing fit when I entered the room and was on a neb treatment but had to stop it.  She is requesting increased mobility to ensure that she is able to move without feeling winded.   Objective: Vitals:   07/01/24 1505 07/01/24 1602  BP: 117/84   Pulse: (!) 113   Resp: 20   Temp: 98.1 F (36.7 C)   SpO2: (!) 88% 94%   No intake or output data in the 24 hours ending 07/01/24 1635 Filed Weights   06/28/24 1627 07/01/24 0433  Weight: 84 kg 43.2 kg    Exam:   General:  Appears calm and comfortable and is in NAD, at rest, on [place] O2 Eyes:  normal lids, iris ENT:  grossly normal hearing, lips & tongue, mmm Cardiovascular:  RRR, no m/r/g. No LE edema.  Respiratory:   diffuse wheezing,  Normal respiratory effort on [place] O2. Abdomen:  soft, NT, ND Skin:  no rash or induration seen on limited exam Musculoskeletal:  grossly normal tone BUE/BLE, good ROM, no bony abnormality Psychiatric:  grossly normal mood and affect, speech fluent and appropriate, AOx3 Neurologic:  CN 2-12 grossly intact, moves all extremities in coordinated fashion  Data Reviewed: I have reviewed the patient's  lab results since admission.  Pertinent labs for today include:   Glucose 235 WBC 10.6 Hgb 11.7     Family Communication: None present  Disposition: Status is: Inpatient Remains inpatient appropriate because: ongoing management     Time spent: 50 minutes  Unresulted Labs (From admission, onward)    None        Author: Delon Herald, MD 07/01/2024 4:35 PM  For on call review [email or web address].

## 2024-07-02 DIAGNOSIS — J383 Other diseases of vocal cords: Secondary | ICD-10-CM

## 2024-07-02 DIAGNOSIS — J441 Chronic obstructive pulmonary disease with (acute) exacerbation: Secondary | ICD-10-CM | POA: Diagnosis not present

## 2024-07-02 DIAGNOSIS — K219 Gastro-esophageal reflux disease without esophagitis: Secondary | ICD-10-CM | POA: Diagnosis not present

## 2024-07-02 LAB — GLUCOSE, CAPILLARY
Glucose-Capillary: 141 mg/dL — ABNORMAL HIGH (ref 70–99)
Glucose-Capillary: 228 mg/dL — ABNORMAL HIGH (ref 70–99)
Glucose-Capillary: 249 mg/dL — ABNORMAL HIGH (ref 70–99)
Glucose-Capillary: 213 mg/dL — ABNORMAL HIGH (ref 70–99)

## 2024-07-02 MED ORDER — REVEFENACIN 175 MCG/3ML IN SOLN
175.0000 ug | Freq: Every day | RESPIRATORY_TRACT | Status: DC
  Administered 2024-07-03 – 2024-07-04 (×2): 175 ug via RESPIRATORY_TRACT
  Filled 2024-07-02 (×2): qty 3

## 2024-07-02 MED ORDER — PANTOPRAZOLE SODIUM 40 MG IV SOLR
40.0000 mg | Freq: Two times a day (BID) | INTRAVENOUS | Status: DC
  Administered 2024-07-02 – 2024-07-03 (×3): 40 mg via INTRAVENOUS
  Filled 2024-07-02 (×3): qty 10

## 2024-07-02 MED ORDER — ARFORMOTEROL TARTRATE 15 MCG/2ML IN NEBU
15.0000 ug | INHALATION_SOLUTION | Freq: Two times a day (BID) | RESPIRATORY_TRACT | Status: DC
  Administered 2024-07-02 – 2024-07-04 (×4): 15 ug via RESPIRATORY_TRACT
  Filled 2024-07-02 (×4): qty 2

## 2024-07-02 MED ORDER — HYDROCODONE BIT-HOMATROP MBR 5-1.5 MG/5ML PO SOLN
5.0000 mL | ORAL | Status: DC | PRN
  Administered 2024-07-03 – 2024-07-04 (×2): 5 mL via ORAL
  Filled 2024-07-02 (×2): qty 5

## 2024-07-02 MED ORDER — BENZONATATE 100 MG PO CAPS
200.0000 mg | ORAL_CAPSULE | Freq: Three times a day (TID) | ORAL | Status: DC
  Administered 2024-07-02 – 2024-07-04 (×6): 200 mg via ORAL
  Filled 2024-07-02 (×6): qty 2

## 2024-07-02 MED ORDER — BUDESONIDE 0.5 MG/2ML IN SUSP
0.5000 mg | Freq: Two times a day (BID) | RESPIRATORY_TRACT | Status: DC
  Administered 2024-07-02 – 2024-07-04 (×4): 0.5 mg via RESPIRATORY_TRACT
  Filled 2024-07-02 (×4): qty 2

## 2024-07-02 MED ORDER — IPRATROPIUM-ALBUTEROL 0.5-2.5 (3) MG/3ML IN SOLN: 3.0000 mL | RESPIRATORY_TRACT | Status: DC | PRN

## 2024-07-02 NOTE — Plan of Care (Signed)
   Problem: Education: Goal: Knowledge of General Education information will improve Description: Including pain rating scale, medication(s)/side effects and non-pharmacologic comfort measures Outcome: Progressing   Problem: Clinical Measurements: Goal: Ability to maintain clinical measurements within normal limits will improve Outcome: Progressing Goal: Respiratory complications will improve Outcome: Progressing   Problem: Coping: Goal: Level of anxiety will decrease Outcome: Progressing

## 2024-07-02 NOTE — Plan of Care (Signed)
  Problem: Clinical Measurements: Goal: Ability to maintain clinical measurements within normal limits will improve Outcome: Progressing Goal: Cardiovascular complication will be avoided Outcome: Progressing   Problem: Activity: Goal: Risk for activity intolerance will decrease Outcome: Progressing   Problem: Metabolic: Goal: Ability to maintain appropriate glucose levels will improve Outcome: Progressing

## 2024-07-02 NOTE — Consult Note (Addendum)
 NAME:  [name], MRN:  [id number], DOB:  10-05-1946, LOS: 3 ADMISSION DATE:  06/28/2024, CONSULTATION DATE: 07/02/2024 REFERRING MD: Delon Herald, MD, CHIEF COMPLAINT: COPD exacerbation  History of Present Illness:  The patient, with moderate COPD and right lung cancer, presents with worsening shortness of breath and wheezing.  Dyspnea and wheezing - Worsening shortness of breath and wheezing, particularly during ambulation - Symptoms significantly impact daily activities - Symptoms became more pronounced during a recent cruise to [place]  - Required hospitalization for one week without significant improvement - Started on supplemental oxygen during this admission - No recent exposure to sick contacts during cruise - Suspects environmental factors at home, such as particles from the ceiling, may contribute to respiratory issues  Cough and respiratory symptoms - Intermittent episodes of coughing lasting for months, followed by periods of hard breathing - Current episode is severe and persistent - Occasional snoring or wheezing at night, with suspicion of possible sleep apnea - Husband has attempted to record nocturnal symptoms  Chronic obstructive pulmonary disease (copd) and lung cancer history - Moderate COPD - Diagnosed with right lung cancer in 2022, treated with radiation therapy  Laryngeal and vocal cord edema - Evaluated by ENT in 2020 with findings of moderate laryngeal edema suggestive of LPR - No recent follow-up with ENT as previously recommended  Tobacco use history - Quit smoking in 2007  Current outpatient medications - Breztri , twice daily - Protonix  in the morning for acid reflux  Pertinent  Medical History    has a past medical history of Allergy  (09/30/1996), Arthritis, Asthma, Complication of anesthesia, COPD (chronic obstructive pulmonary disease) (HCC), Diabetes (HCC), High cholesterol, History of radiation therapy, Hypertension, Lung cancer (HCC),  Neuromuscular disorder (HCC), Pneumonia (09/10/2017), PONV (postoperative nausea and vomiting), and Stroke (HCC) (2007).   Significant Hospital Events: Including procedures, antibiotic start and stop dates in addition to other pertinent events   6/30 admit 7/4 PCCM consulted for slow to resolve exacerbation  Interim History / Subjective:    Objective    Blood pressure (!) 155/76, pulse 91, temperature 98.1 F (36.7 C), resp. rate 19, height 5' 6 (1.676 m), weight 44.6 kg, SpO2 98%.        Intake/Output Summary (Last 24 hours) at 07/02/2024 1624 Last data filed at 07/02/2024 0900 Gross per 24 hour  Intake 360 ml  Output --  Net 360 ml   Filed Weights   06/28/24 1627 07/01/24 0433 07/02/24 0429  Weight: 84 kg 43.2 kg 44.6 kg    Examination: Blood pressure (!) 155/76, pulse 91, temperature 98.1 F (36.7 C), resp. rate 19, height 5' 6 (1.676 m), weight 44.6 kg, SpO2 98%. Gen:      No acute distress HEENT:  EOMI, sclera anicteric Neck:     No masses; no thyromegaly Lungs:    Bilateral expiratory wheeze appears small in the upper airways, rhonchorous breathing CV:         Regular rate and rhythm; no murmurs Abd:      + bowel sounds; soft, non-tender; no palpable masses, no distension Ext:    No edema; adequate peripheral perfusion Neuro: alert and oriented x 3 Psych: normal mood and affect   Lab/imaging reviewed Metabolic panel stable, WBC 10.6 Negative flu, RSV, COVID  Spirometry 08/10/2017-moderate obstruction CTA on 6/30-no pulm embolism, posttreatment scarring in the right suprahilar area with soft tissue fullness, 3 mm nodule in the left upper lobe, stable 5 mm nodule in the right middle lobe  Resolved problem  list   Assessment and Plan  Chronic Obstructive Pulmonary Disease (COPD) Moderate COPD with recent exacerbation characterized by increased dyspnea and wheezing, worsened post-cruise. Oxygen therapy initiated due to exertional hypoxemia.  I suspect persistent  wheeze is more due to upper airway issues Current outpatient medications include Breztri  and prednisone , with nebulizer treatments providing limited relief. - Start Yupelri , Brovana  and Pulmicort  nebs, DuoNebs as needed.   - Continue steroids ( On Solumedrol 80 mg q12) at current dose and singulair  -Tessalon  Perles and cough medication -Check ambulatory sats and home oxygen therapy if oxygen saturation drops below 90% during exertion. - Reassess need for home oxygen therapy at follow-up.  Vocal cord dysfunction Suspected vocal cord dysfunction contributing to respiratory difficulties. 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 Referral has been made as an outpatient to ENT specialist for further evaluation and management is pending.  Gastroesophageal reflux disease (GERD) GERD potentially contributing to vocal cord dysfunction and respiratory symptoms. Currently on Protonix  once daily with no significant acid reflux symptoms reported. - Increase Protonix  to twice daily to manage potential reflux contributing to vocal cord issues.  Pepcid  at night  Lung cancer Right lung cancer diagnosed in 2022, treated with radiation. No current evidence of recurrence or complications on recent CT scan.  Best Practice (right click and Reselect all SmartList Selections daily)   Per primary team  Signature:   Alyshia Kernan MD [facility] See Amion for pager  If no response to pager , please call [phone] until 7pm After 7:00 pm call Elink  [phone] 07/02/2024, 4:53 PM

## 2024-07-02 NOTE — Progress Notes (Signed)
 This RN ambulated patient in hallway per MD verbal order. Pt ambulated about 248ft and stopped and c/o severe wheezing and SOB. Pt requested a break. Ambulated pt back to room and checked oxygen saturation and pt was desating to low 90s on 3L, increased oxygen to 4L and patient oxygen saturation improved to 100%    Written by Graylin Ned, RN Cosigned by Delon Herald, M.D.

## 2024-07-02 NOTE — Progress Notes (Signed)
 Progress Note   Patient: [name] FMW:[id number] DOB: Jan 01, 1946 DOA: 06/28/2024     3 DOS: the patient was seen and examined on 07/02/2024   Brief hospital course: 77yo with h/o COPD, DM, HTN, HLD, prior lung cancer (completed radiation in 2022), and chronic diastolic dysfunction who presented on 6/30 with SOB.  She was treated for a COPD exacerbation.    Assessment and Plan:  Acute COPD exacerbation  Patient with new O2 requirement, significant wheezing despite current treatments Chest x-ray/CTA is not consistent with pneumonia Nebulizers: scheduled Duoneb and prn albuterol  Solu-Medrol  80 mg IV BID -> Prednisone  when better Continue Breztri , Singulair  Continues to have new O2 requirement, will dc home with O2 when ready Given her persistent symptoms, will consult pulmonology to consult   HTN Continue bisoprolol    HLD Continue atorvastatin  Also on Repatha  as an outpatient   H/o lung cancer Stage 1 adenocarcinoma Completed radiation therapy in 11/2021 No obvious recurrence on current imaging   DM Recent A1c 6.4, good control Not on medications other than Ozempic  Will cover with moderate-scale SSI Continue pregabalin    Chronic pain I have reviewed this patient in the [place] Controlled Substances Reporting System.  She is receiving medications from only one provider and appears to be taking them as prescribed. She is not at particularly high risk of overdose but is at increased risk of opioid misuse or diversion.  Continue Lyrica , duloxetine    Chronic HFpEF Most recent echo was in 07/2021 Appears compensated Continue ASA           Consultants: None   Procedures: None   Antibiotics: None  30 Day Unplanned Readmission Risk Score    Flowsheet Row ED to Hosp-Admission (Current) from 06/28/2024 in [facility] [place]  30 Day Unplanned Readmission Risk Score (%) 16.88 Filed at 07/02/2024 1200    This score is the patient's risk  of an unplanned readmission within 30 days of being discharged (0 -100%). The score is based on dignosis, age, lab data, medications, orders, and past utilization.   Low:  0-14.9   Medium: 15-21.9   High: 22-29.9   Extreme: 30 and above           Subjective: She reports that she always wheezes but it is still worse than baseline. Even with 3L [place] O2, she continues to desat with ambulation and so is not stable for dc at this time.   Objective: Vitals:   07/02/24 0819 07/02/24 0937  BP:  (!) 144/64  Pulse:  (!) 102  Resp:    Temp:    SpO2: 98% 96%    Intake/Output Summary (Last 24 hours) at 07/02/2024 1357 Last data filed at 07/02/2024 0900 Gross per 24 hour  Intake 360 ml  Output --  Net 360 ml   Filed Weights   06/28/24 1627 07/01/24 0433 07/02/24 0429  Weight: 84 kg 43.2 kg 44.6 kg    Exam:  General:  Appears calm and comfortable and is in NAD, at rest, on [place] O2 Eyes:  normal lids, iris ENT:  grossly normal hearing, lips & tongue, mmm Cardiovascular:  RRR, no m/r/g. No LE edema.  Respiratory:   diffuse wheezing,  Normal respiratory effort on [place] O2. Abdomen:  soft, NT, ND Skin:  no rash or induration seen on limited exam Musculoskeletal:  grossly normal tone BUE/BLE, good ROM, no bony abnormality Psychiatric:  grossly normal mood and affect, speech fluent and appropriate, AOx3 Neurologic:  CN 2-12 grossly  intact, moves all extremities in coordinated fashion  Data Reviewed: I have reviewed the patient's lab results since admission.  Pertinent labs for today include:   None today      Family Communication: None present  Disposition: Status is: Inpatient Remains inpatient appropriate because: persistent symptoms     Time spent: 50 minutes  Unresulted Labs (From admission, onward)     Start     Ordered   07/03/24 0500  CBC with Differential/Platelet  Tomorrow morning,   R       Question:  Specimen collection method  Answer:  Lab=Lab collect   07/02/24  1356   07/03/24 0500  Basic metabolic panel with GFR  Tomorrow morning,   R       Question:  Specimen collection method  Answer:  Lab=Lab collect   07/02/24 1356             Author: Delon Herald, MD 07/02/2024 1:57 PM  For on call review [email or web address].

## 2024-07-03 DIAGNOSIS — J441 Chronic obstructive pulmonary disease with (acute) exacerbation: Secondary | ICD-10-CM | POA: Diagnosis not present

## 2024-07-03 DIAGNOSIS — J383 Other diseases of vocal cords: Secondary | ICD-10-CM | POA: Diagnosis not present

## 2024-07-03 DIAGNOSIS — K219 Gastro-esophageal reflux disease without esophagitis: Secondary | ICD-10-CM | POA: Diagnosis not present

## 2024-07-03 LAB — GLUCOSE, CAPILLARY
Glucose-Capillary: 161 mg/dL — ABNORMAL HIGH (ref 70–99)
Glucose-Capillary: 382 mg/dL — ABNORMAL HIGH (ref 70–99)
Glucose-Capillary: 324 mg/dL — ABNORMAL HIGH (ref 70–99)
Glucose-Capillary: 238 mg/dL — ABNORMAL HIGH (ref 70–99)
Glucose-Capillary: 203 mg/dL — ABNORMAL HIGH (ref 70–99)

## 2024-07-03 LAB — CBC WITH DIFFERENTIAL/PLATELET
Abs Immature Granulocytes: 0.18 K/uL — ABNORMAL HIGH (ref 0.00–0.07)
Basophils Absolute: 0 K/uL (ref 0.0–0.1)
Basophils Relative: 0 %
Eosinophils Absolute: 0 K/uL (ref 0.0–0.5)
Eosinophils Relative: 0 %
HCT: 38.1 % (ref 36.0–46.0)
Hemoglobin: 12.2 g/dL (ref 12.0–15.0)
Immature Granulocytes: 2 %
Lymphocytes Relative: 6 %
Lymphs Abs: 0.7 K/uL (ref 0.7–4.0)
MCH: 28.7 pg (ref 26.0–34.0)
MCHC: 32 g/dL (ref 30.0–36.0)
MCV: 89.6 fL (ref 80.0–100.0)
Monocytes Absolute: 0.4 K/uL (ref 0.1–1.0)
Monocytes Relative: 4 %
Neutro Abs: 9.7 K/uL — ABNORMAL HIGH (ref 1.7–7.7)
Neutrophils Relative %: 88 %
Platelets: 406 K/uL — ABNORMAL HIGH (ref 150–400)
RBC: 4.25 MIL/uL (ref 3.87–5.11)
RDW: 14.4 % (ref 11.5–15.5)
WBC: 11 K/uL — ABNORMAL HIGH (ref 4.0–10.5)
nRBC: 0 % (ref 0.0–0.2)

## 2024-07-03 LAB — CULTURE, BLOOD (ROUTINE X 2)
Culture: NO GROWTH
Culture: NO GROWTH
Special Requests: ADEQUATE
Special Requests: ADEQUATE

## 2024-07-03 LAB — BASIC METABOLIC PANEL WITH GFR
Anion gap: 9 (ref 5–15)
BUN: 16 mg/dL (ref 8–23)
CO2: 32 mmol/L (ref 22–32)
Calcium: 9 mg/dL (ref 8.9–10.3)
Chloride: 98 mmol/L (ref 98–111)
Creatinine, Ser: 0.6 mg/dL (ref 0.44–1.00)
GFR, Estimated: >60 mL/min (ref >60)
Glucose, Bld: 189 mg/dL — ABNORMAL HIGH (ref 70–99)
Potassium: 4.3 mmol/L (ref 3.5–5.1)
Sodium: 139 mmol/L (ref 135–145)

## 2024-07-03 NOTE — Progress Notes (Signed)
 NAME:  [name], MRN:  [id number], DOB:  15-Dec-1946, LOS: 4 ADMISSION DATE:  06/28/2024, CONSULTATION DATE: 07/02/2024 REFERRING MD: Delon Herald, MD, CHIEF COMPLAINT: COPD exacerbation  History of Present Illness:  The patient, with moderate COPD and right lung cancer, presents with worsening shortness of breath and wheezing.  Dyspnea and wheezing - Worsening shortness of breath and wheezing, particularly during ambulation - Symptoms significantly impact daily activities - Symptoms became more pronounced during a recent cruise to [place]  - Required hospitalization for one week without significant improvement - Started on supplemental oxygen during this admission - No recent exposure to sick contacts during cruise - Suspects environmental factors at home, such as particles from the ceiling, may contribute to respiratory issues  Cough and respiratory symptoms - Intermittent episodes of coughing lasting for months, followed by periods of hard breathing - Current episode is severe and persistent - Occasional snoring or wheezing at night, with suspicion of possible sleep apnea - Husband has attempted to record nocturnal symptoms  Chronic obstructive pulmonary disease (copd) and lung cancer history - Moderate COPD - Diagnosed with right lung cancer in 2022, treated with radiation therapy  Laryngeal and vocal cord edema - Evaluated by ENT in 2020 with findings of moderate laryngeal edema suggestive of LPR - No recent follow-up with ENT as previously recommended  Tobacco use history - Quit smoking in 2007  Current outpatient medications - Breztri , twice daily - Protonix  in the morning for acid reflux  Pertinent  Medical History    has a past medical history of Allergy  (09/30/1996), Arthritis, Asthma, Complication of anesthesia, COPD (chronic obstructive pulmonary disease) (HCC), Diabetes (HCC), High cholesterol, History of radiation therapy, Hypertension, Lung cancer (HCC),  Neuromuscular disorder (HCC), Pneumonia (09/10/2017), PONV (postoperative nausea and vomiting), and Stroke (HCC) (2007).   Significant Hospital Events: Including procedures, antibiotic start and stop dates in addition to other pertinent events   6/30 admit 7/4 PCCM consulted for slow to resolve exacerbation  Interim History / Subjective:   Feels better  Objective    Blood pressure (!) 157/76, pulse 85, temperature 98.2 F (36.8 C), temperature source Oral, resp. rate 19, height 5' 6 (1.676 m), weight 43.7 kg, SpO2 98%.        Intake/Output Summary (Last 24 hours) at 07/03/2024 1007 Last data filed at 07/03/2024 0630 Gross per 24 hour  Intake 240 ml  Output --  Net 240 ml   Filed Weights   07/01/24 0433 07/02/24 0429 07/03/24 0439  Weight: 43.2 kg 44.6 kg 43.7 kg    Examination: Gen:      No acute distress HEENT:  EOMI, sclera anicteric Neck:     No masses; no thyromegaly Lungs:   Bilateral rhonchi, probably wheeze CV:         Regular rate and rhythm; no murmurs Abd:      + bowel sounds; soft, non-tender; no palpable masses, no distension Ext:    No edema; adequate peripheral perfusion Neuro: alert and oriented x 3 Psych: normal mood and affect   Lab/imaging reviewed BMP stable, WBC 11.0, platelets 406  Spirometry 08/10/2017-moderate obstruction CTA on 6/30-no pulm embolism, posttreatment scarring in the right suprahilar area with soft tissue fullness, 3 mm nodule in the left upper lobe, stable 5 mm nodule in the right middle lobe  Resolved problem list   Assessment and Plan  Chronic Obstructive Pulmonary Disease (COPD) Moderate COPD with recent exacerbation characterized by increased dyspnea and wheezing, worsened post-cruise. Oxygen therapy initiated due to  exertional hypoxemia.  I suspect persistent wheeze is more due to upper airway issues Current outpatient medications include Breztri  and prednisone , with nebulizer treatments providing limited relief. - Continue  Yupelri , Brovana  and Pulmicort  nebs, DuoNebs as needed.   - Continue steroids ( On Solumedrol 80 mg q12) at current dose and singulair  -Tessalon  Perles and cough medication -Check ambulatory sats and home oxygen therapy if oxygen saturation drops below 90% during exertion. - Reassess need for home oxygen therapy at follow-up.  Vocal cord dysfunction Suspected vocal cord dysfunction contributing to respiratory difficulties. 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 Referral has been made as an outpatient to ENT specialist for further evaluation and management is pending.  Gastroesophageal reflux disease (GERD) GERD potentially contributing to vocal cord dysfunction and respiratory symptoms. Currently on Protonix  once daily with no significant acid reflux symptoms reported. - Increase Protonix  to twice daily to manage potential reflux contributing to vocal cord issues.  Pepcid  at night  Lung cancer Right lung cancer diagnosed in 2022, treated with radiation. No current evidence of recurrence or complications on recent CT scan.  Best Practice (right click and Reselect all SmartList Selections daily)   Per primary team  Signature:   Saria Haran MD [facility] See Amion for pager  If no response to pager , please call [phone] until 7pm After 7:00 pm call Elink  [phone] 07/03/2024, 10:07 AM

## 2024-07-03 NOTE — Progress Notes (Signed)
 Progress Note   Patient: [name] FMW:[id number] DOB: 29-Dec-1946 DOA: 06/28/2024     4 DOS: the patient was seen and examined on 07/03/2024   Brief hospital course: 78yo with h/o COPD, DM, HTN, HLD, prior lung cancer (completed radiation in 2022), and chronic diastolic dysfunction who presented on 6/30 with SOB.  She was treated for a COPD exacerbation.    Assessment and Plan:  Acute COPD exacerbation  Patient with new O2 requirement, significant wheezing despite current treatments Chest x-ray/CTA is not consistent with pneumonia Nebulizers: scheduled Duoneb and prn albuterol  Solu-Medrol  80 mg IV BID -> Prednisone  when better Continue Breztri , Singulair  Continues to have new O2 requirement, will dc home with O2 when ready Given her persistent symptoms, pulmonology was consulted Dr. Theophilus suspects upper airway issues with VCD On Yupelri , Brovana , Pulmicort , and Duonebs as well as steroids and Tessalon  perles Needs outpatient ENT f/u   HTN Continue bisoprolol    HLD Continue atorvastatin  Also on Repatha  as an outpatient   H/o lung cancer Stage 1 adenocarcinoma Completed radiation therapy in 11/2021 No obvious recurrence on current imaging   DM Recent A1c 6.4, good control Not on medications other than Ozempic  Will cover with moderate-scale SSI Continue pregabalin    Chronic pain I have reviewed this patient in the [place] Controlled Substances Reporting System.  She is receiving medications from only one provider and appears to be taking them as prescribed. She is not at particularly high risk of overdose but is at increased risk of opioid misuse or diversion.  Continue Lyrica , duloxetine    Chronic HFpEF Most recent echo was in 07/2021 Appears compensated Continue ASA           Consultants: None   Procedures: None   Antibiotics: None  30 Day Unplanned Readmission Risk Score    Flowsheet Row ED to Hosp-Admission (Current) from [facility]  30 Day Unplanned Readmission Risk Score (%) 17.59 Filed at 07/03/2024 0400    This score is the patient's risk of an unplanned readmission within 30 days of being discharged (0 -100%). The score is based on dignosis, age, lab data, medications, orders, and past utilization.   Low:  0-14.9   Medium: 15-21.9   High: 22-29.9   Extreme: 30 and above           Subjective: Continues to wheeze but air movement appears to be better today.  Patient was seen before she was up and mobile, however - and this tends to worsen her symptoms.   Objective: Vitals:   07/03/24 0626 07/03/24 0630  BP:    Pulse:    Resp:    Temp:    SpO2: 98% 98%    Intake/Output Summary (Last 24 hours) at 07/03/2024 1239 Last data filed at 07/03/2024 1100 Gross per 24 hour  Intake 480 ml  Output --  Net 480 ml   Filed Weights   07/01/24 0433 07/02/24 0429 07/03/24 0439  Weight: 43.2 kg 44.6 kg 43.7 kg    Exam:  General:  Appears calm and comfortable and is in NAD, at rest, on [place] O2 Eyes:  normal lids, iris ENT:  grossly normal hearing, lips & tongue, mmm Cardiovascular:  RRR, no m/r/g. No LE edema.  Respiratory:   diffuse wheezing but with improved air movement and less audible wheezing from bedside,  Normal respiratory effort on [place] O2. Abdomen:  soft, NT, ND Skin:  no rash or induration seen on limited exam  Musculoskeletal:  grossly normal tone BUE/BLE, good ROM, no bony abnormality Psychiatric:  grossly normal mood and affect, speech fluent and appropriate, AOx3 Neurologic:  CN 2-12 grossly intact, moves all extremities in coordinated fashion   Data Reviewed: I have reviewed the patient's lab results since admission.  Pertinent labs for today include:   Glucose 189 WBC 11 Platelets 406     Family Communication: None present  Disposition: Status is: Inpatient Remains inpatient appropriate because: ongoing management     Time spent: 50  minutes  Unresulted Labs (From admission, onward)    None        Author: Delon Herald, MD 07/03/2024 12:39 PM  For on call review [email or web address].

## 2024-07-03 NOTE — Plan of Care (Signed)
 Pt alert and oriented.Pt denies pain. expiratory wheezing. Prns given for cough and muscle spasm. Patient showed improvement with prns. Pt on 3L. Safety precautions maintained.   Problem: Education: Goal: Knowledge of General Education information will improve Description: Including pain rating scale, medication(s)/side effects and non-pharmacologic comfort measures Outcome: Progressing   Problem: Health Behavior/Discharge Planning: Goal: Ability to manage health-related needs will improve Outcome: Progressing   Problem: Clinical Measurements: Goal: Ability to maintain clinical measurements within normal limits will improve Outcome: Progressing Goal: Will remain free from infection Outcome: Progressing Goal: Diagnostic test results will improve Outcome: Progressing Goal: Respiratory complications will improve Outcome: Progressing Goal: Cardiovascular complication will be avoided Outcome: Progressing   Problem: Activity: Goal: Risk for activity intolerance will decrease Outcome: Progressing   Problem: Nutrition: Goal: Adequate nutrition will be maintained Outcome: Progressing   Problem: Coping: Goal: Level of anxiety will decrease Outcome: Progressing   Problem: Elimination: Goal: Will not experience complications related to bowel motility Outcome: Progressing Goal: Will not experience complications related to urinary retention Outcome: Progressing   Problem: Pain Managment: Goal: General experience of comfort will improve and/or be controlled Outcome: Progressing   Problem: Safety: Goal: Ability to remain free from injury will improve Outcome: Progressing   Problem: Skin Integrity: Goal: Risk for impaired skin integrity will decrease Outcome: Progressing   Problem: Education: Goal: Ability to describe self-care measures that may prevent or decrease complications (Diabetes Survival Skills Education) will improve Outcome: Progressing Goal: Individualized  Educational Video(s) Outcome: Progressing   Problem: Coping: Goal: Ability to adjust to condition or change in health will improve Outcome: Progressing   Problem: Fluid Volume: Goal: Ability to maintain a balanced intake and output will improve Outcome: Progressing   Problem: Health Behavior/Discharge Planning: Goal: Ability to identify and utilize available resources and services will improve Outcome: Progressing Goal: Ability to manage health-related needs will improve Outcome: Progressing   Problem: Metabolic: Goal: Ability to maintain appropriate glucose levels will improve Outcome: Progressing   Problem: Nutritional: Goal: Maintenance of adequate nutrition will improve Outcome: Progressing Goal: Progress toward achieving an optimal weight will improve Outcome: Progressing   Problem: Skin Integrity: Goal: Risk for impaired skin integrity will decrease Outcome: Progressing   Problem: Tissue Perfusion: Goal: Adequacy of tissue perfusion will improve Outcome: Progressing

## 2024-07-04 DIAGNOSIS — J441 Chronic obstructive pulmonary disease with (acute) exacerbation: Secondary | ICD-10-CM | POA: Diagnosis not present

## 2024-07-04 LAB — GLUCOSE, CAPILLARY
Glucose-Capillary: 104 mg/dL — ABNORMAL HIGH (ref 70–99)
Glucose-Capillary: 358 mg/dL — ABNORMAL HIGH (ref 70–99)

## 2024-07-04 MED ORDER — BENZONATATE 200 MG PO CAPS: 200.0000 mg | ORAL_CAPSULE | Freq: Three times a day (TID) | ORAL | 0 refills | Status: DC

## 2024-07-04 MED ORDER — IPRATROPIUM-ALBUTEROL 0.5-2.5 (3) MG/3ML IN SOLN: 3.0000 mL | RESPIRATORY_TRACT | 0 refills | Status: AC | PRN

## 2024-07-04 MED ORDER — PANTOPRAZOLE SODIUM 40 MG PO TBEC: 40.0000 mg | DELAYED_RELEASE_TABLET | Freq: Two times a day (BID) | ORAL | Status: DC

## 2024-07-04 MED ORDER — ARFORMOTEROL TARTRATE 15 MCG/2ML IN NEBU: 15.0000 ug | INHALATION_SOLUTION | Freq: Two times a day (BID) | RESPIRATORY_TRACT | 0 refills | Status: DC

## 2024-07-04 MED ORDER — BUDESONIDE 0.5 MG/2ML IN SUSP: 0.5000 mg | Freq: Two times a day (BID) | RESPIRATORY_TRACT | 0 refills | Status: DC

## 2024-07-04 MED ORDER — REVEFENACIN 175 MCG/3ML IN SOLN: 175.0000 ug | Freq: Every day | RESPIRATORY_TRACT | 0 refills | Status: DC

## 2024-07-04 MED ORDER — HYDROCODONE BIT-HOMATROP MBR 5-1.5 MG/5ML PO SOLN: 5.0000 mL | ORAL | 0 refills | Status: DC | PRN

## 2024-07-04 MED ORDER — PANTOPRAZOLE SODIUM 40 MG IV SOLR
40.0000 mg | Freq: Once | INTRAVENOUS | Status: AC
  Administered 2024-07-04: 40 mg via INTRAVENOUS
  Filled 2024-07-04: qty 10

## 2024-07-04 MED ORDER — METHOCARBAMOL 500 MG PO TABS: 500.0000 mg | ORAL_TABLET | Freq: Three times a day (TID) | ORAL | 0 refills | Status: DC | PRN

## 2024-07-04 MED ORDER — PREDNISONE 10 MG PO TABS: ORAL_TABLET | ORAL | 0 refills | Status: DC

## 2024-07-04 NOTE — Discharge Summary (Addendum)
 Physician Discharge Summary   Patient: [name] MRN: [id number] DOB: 31-Aug-1946  Admit date:     06/28/2024  Discharge date: 07/04/24  Discharge Physician: Delon Herald   PCP: Jarold Medici, MD   Recommendations at discharge:   You are being discharged home with oxygen Prolonged prednisone  taper is prescribed Continue Singulair , Albuterol  Stop Breztri  and start Brovana  (arformoterol ), Pulmicort  (budesonide ), Duonebs, and Yupelri  (revefenacin ) Take Tessalon  perles and Hycodan cough syrup as needed  Follow up with week with Dr. Jarold Follow up with pulmonology in 1-2 weeks; call for an appointment Follow up with ENT; referral placed  Discharge Diagnoses: Principal Problem:   COPD with acute exacerbation (HCC) Active Problems:   Essential hypertension   DM2 (diabetes mellitus, type 2) (HCC)   Chronic pain   HLD (hyperlipidemia)   Chronic diastolic CHF (congestive heart failure) [facility])    Hospital Course: 77yo with h/o COPD, DM, HTN, HLD, prior lung cancer (completed radiation in 2022), and chronic diastolic dysfunction who presented on 6/30 with SOB.  She was treated for a COPD exacerbation.    Assessment and Plan:  Acute COPD exacerbation  Patient with new O2 requirement (acute hypoxic respiratory failure), significant wheezing despite current treatments Chest x-ray/CTA is not consistent with pneumonia Nebulizers: scheduled Duoneb and prn albuterol  Solu-Medrol  80 mg IV BID -> Prednisone  prolonged taper Continues to have new O2 requirement, will dc home with O2 Given her persistent symptoms, pulmonology was consulted Dr. Theophilus suspects upper airway issues with VCD On Yupelri , Brovana , Pulmicort , and Duonebs as well as steroids and Tessalon  perles and Hycodan cough syrup Continue Singulair  Needs outpatient ENT f/u   HTN Continue bisoprolol  Continue ASA   HLD Continue atorvastatin  Also on Repatha  as an outpatient   H/o lung cancer Stage 1  adenocarcinoma Completed radiation therapy in 11/2021 No obvious recurrence on current imaging   DM Recent A1c 6.4, good control Not on medications other than Ozempic  - recommend holding as she is currently underweight Will cover with moderate-scale SSI Continue pregabalin    Chronic pain I have reviewed this patient in the [place] Controlled Substances Reporting System.  She is receiving medications from only one provider and appears to be taking them as prescribed. She is not at particularly high risk of overdose but is at increased risk of opioid misuse or diversion.  Continue Lyrica , duloxetine  No longer taking Percocet   Chronic HFpEF Most recent echo was in 07/2021 Appears compensated Continue ASA  GERD Continue famotidine  and pantoprazole           Consultants: None   Procedures: None   Antibiotics: None  Pain control - [place]  Controlled Substance Reporting System database was reviewed. and patient was instructed, not to drive, operate heavy machinery, perform activities at heights, swimming or participation in water activities or provide baby-sitting services while on Pain, Sleep and Anxiety Medications; until their outpatient Physician has advised to do so again. Also recommended to not to take more than prescribed Pain, Sleep and Anxiety Medications.   Disposition: Home Diet recommendation:  Carb modified diet DISCHARGE MEDICATION: Allergies as of 07/04/2024       Reactions   Other Swelling   HAIR DYE        Medication List     PAUSE taking these medications    Ozempic  (1 MG/DOSE) 4 MG/3ML Sopn Wait to take this until your doctor or other care provider tells you to start again. Generic drug: Semaglutide  (1 MG/DOSE) Inject 1 mg into the skin once a  week. Friday What changed: additional instructions       STOP taking these medications    Breztri  Aerosphere 160-9-4.8 MCG/ACT Aero inhaler Generic drug: budesonide -glycopyrrolate -formoterol     oxyCODONE -acetaminophen  10-325 MG tablet Commonly known as: PERCOCET       TAKE these medications    albuterol  108 (90 Base) MCG/ACT inhaler Commonly known as: VENTOLIN  HFA INHALE 2 PUFFS INTO THE LUNGS EVERY 4 HOURS AS NEEDED FOR WHEEZING OR SHORTNESS OF BREATH   albuterol  (2.5 MG/3ML) 0.083% nebulizer solution Commonly known as: PROVENTIL  Take 3 mLs (2.5 mg total) by nebulization every 4 (four) hours as needed.   arformoterol  15 MCG/2ML Nebu Commonly known as: BROVANA  Take 2 mLs (15 mcg total) by nebulization 2 (two) times daily.   aspirin  81 MG tablet Take 81 mg by mouth daily.   atorvastatin  80 MG tablet Commonly known as: LIPITOR TAKE 1 TABLET BY MOUTH DAILY ON MONDAY-FRIDAY. What changed:  how much to take how to take this when to take this additional instructions   benzonatate  200 MG capsule Commonly known as: TESSALON  Take 1 capsule (200 mg total) by mouth 3 (three) times daily.   Biotin 89999 MCG Tbdp Take 10,000 mcg by mouth daily.   bisoprolol  5 MG tablet Commonly known as: ZEBETA  TAKE 1/2 TABLET BY MOUTH EVERY DAY   budesonide  0.5 MG/2ML nebulizer solution Commonly known as: PULMICORT  Take 2 mLs (0.5 mg total) by nebulization 2 (two) times daily.   CALCIUM -MAGNESIUM -ZINC PO Take 1 capsule by mouth at bedtime.   DULoxetine  60 MG capsule Commonly known as: CYMBALTA  TAKE 1 CAPSULE(60 MG) BY MOUTH DAILY   famotidine  20 MG tablet Commonly known as: PEPCID  Take 1 tablet (20 mg total) by mouth daily after supper.   HYDROcodone  bit-homatropine 5-1.5 MG/5ML syrup Commonly known as: HYCODAN Take 5 mLs by mouth every 4 (four) hours as needed for cough.   ipratropium-albuterol  0.5-2.5 (3) MG/3ML Soln Commonly known as: DUONEB Take 3 mLs by nebulization every 4 (four) hours as needed.   Magnesium  250 MG Tabs Take 1 tablet by mouth daily.   methocarbamol  500 MG tablet Commonly known as: ROBAXIN  Take 1 tablet (500 mg total) by mouth every 8  (eight) hours as needed for muscle spasms.   montelukast  10 MG tablet Commonly known as: SINGULAIR  TAKE 1 TABLET(10 MG) BY MOUTH AT BEDTIME   pantoprazole  40 MG tablet Commonly known as: PROTONIX  TAKE 1 TABLET BY MOUTH 30 MINUTES BEFORE FIRST MEAL OF THE DAY   predniSONE  10 MG tablet Commonly known as: DELTASONE  Take 4 tablets (40 mg total) by mouth daily with breakfast for 5 days, THEN 3 tablets (30 mg total) daily with breakfast for 5 days, THEN 2 tablets (20 mg total) daily with breakfast for 5 days, THEN 1 tablet (10 mg total) daily with breakfast for 5 days, THEN 0.5 tablets (5 mg total) daily with breakfast for 5 days. Start taking on: July 04, 2024   pregabalin  100 MG capsule Commonly known as: LYRICA  Take 1 capsule (100 mg total) by mouth 3 (three) times daily. What changed: when to take this   promethazine  25 MG tablet Commonly known as: PHENERGAN  Take 25 mg by mouth every 6 (six) hours as needed for nausea or vomiting.   Repatha  SureClick 140 MG/ML [place] Generic drug: Evolocumab  Inject 140 mg into the skin every 14 (fourteen) days.   revefenacin  175 MCG/3ML nebulizer solution Commonly known as: YUPELRI  Take 3 mLs (175 mcg total) by nebulization daily. Start taking on: July 05, 2024   Vitamin D3 50 MCG (2000 UT) Tabs Take 2,000 Units by mouth at bedtime.               Durable Medical Equipment  (From admission, onward)           Start     Ordered   07/04/24 1317  DME Oxygen  Once       Question Answer Comment  Length of Need Lifetime   Mode or (Route) Nasal cannula   Liters per Minute 2   Frequency Continuous (stationary and portable oxygen unit needed)   Oxygen conserving device Yes   Oxygen delivery system Gas      07/04/24 1318            Discharge Exam:   Subjective: Continues to wheeze but she was able to walk 200 feet with frequent rest breaks without dropping O2 sats.  Pulmnology is in agreement with plan for dc to home  today.   Objective: Vitals:   07/04/24 0703 07/04/24 1036  BP: (!) 154/74 (!) 159/79  Pulse: 75 90  Resp: (!) 22   Temp: 98.3 F (36.8 C)   SpO2: 97% 100%   No intake or output data in the 24 hours ending 07/04/24 1320  Filed Weights   07/01/24 0433 07/02/24 0429 07/03/24 0439  Weight: 43.2 kg 44.6 kg 43.7 kg    Exam:  General:  Appears calm and comfortable and is in NAD, at rest, on [place] O2 Eyes:  normal lids, iris ENT:  grossly normal hearing, lips & tongue, mmm Cardiovascular:  RRR, no m/r/g. No LE edema.  Respiratory:   diffuse wheezing but with improved air movement and less audible wheezing from bedside,  Normal respiratory effort on [place] O2. Abdomen:  soft, NT, ND Skin:  no rash or induration seen on limited exam Musculoskeletal:  grossly normal tone BUE/BLE, good ROM, no bony abnormality Psychiatric:  grossly normal mood and affect, speech fluent and appropriate, AOx3 Neurologic:  CN 2-12 grossly intact, moves all extremities in coordinated fashion  Data Reviewed: I have reviewed the patient's lab results since admission.  Pertinent labs for today include:   None today    Condition at discharge: fair  The results of significant diagnostics from this hospitalization (including imaging, microbiology, ancillary and laboratory) are listed below for reference.   Imaging Studies: CT Angio Chest PE W/Cm &/Or Wo Cm Result Date: 06/28/2024 CLINICAL DATA:  PE suspected. Shortness of breath with cough. Recent travel. History of COPD EXAM: CT ANGIOGRAPHY CHEST WITH CONTRAST TECHNIQUE: Multidetector CT imaging of the chest was performed using the standard protocol during bolus administration of intravenous contrast. Multiplanar CT image reconstructions and MIPs were obtained to evaluate the vascular anatomy. RADIATION DOSE REDUCTION: This exam was performed according to the departmental dose-optimization program which includes automated exposure control, adjustment of the mA  and/or kV according to patient size and/or use of iterative reconstruction technique. CONTRAST:  OMNIPAQUE  IOHEXOL  350 MG/ML SOLN COMPARISON:  Same day radiograph and CT chest 04/08/2024 FINDINGS: Cardiovascular: No pericardial effusion. Normal caliber thoracic aorta without dissection. Coronary artery and aortic atherosclerotic calcification. Negative for acute pulmonary embolism. Mediastinum/Nodes: Similar soft tissue fullness in the upper right hilar region measuring 12 mm, previously 13 mm. Inferior index right hilar lymph node measures 13 mm, previously 10 mm. Trachea and esophagus are unremarkable. Lungs/Pleura: Centrilobular and paraseptal emphysema. Post treatment scarring in the suprahilar posterior right lung about a fiducial marker is not substantially changed from  04/08/2024. 5 mm perifissural nodule in the right middle lobe is unchanged. New 3 mm nodule in the anterior left upper lobe on series 6/image 51. No pleural effusion or pneumothorax. Upper Abdomen: No acute abnormality. Musculoskeletal: No acute fracture. Review of the MIP images confirms the above findings. IMPRESSION: 1. Negative for acute pulmonary embolism. 2. Post treatment scarring in the suprahilar posterior right lung is not substantially changed from 04/08/2024. 3. Similar soft tissue fullness in the upper right hilar region. Inferior index right hilar lymph node measures 13 mm, previously 10 mm. 4. New 3 mm nodule in the anterior left upper lobe. Stable 5 mm perifissural nodule in the right middle lobe. Aortic Atherosclerosis (ICD10-I70.0) and Emphysema (ICD10-J43.9). Electronically Signed   By: Norman Gatlin M.D.   On: 06/28/2024 21:28   DG Chest Port 1 View Result Date: 06/28/2024 CLINICAL DATA:  Shortness of breath EXAM: PORTABLE CHEST 1 VIEW COMPARISON:  12/11/2022, CT 04/08/2024 FINDINGS: Hyperinflation with emphysema. Distortion in the right suprahilar lung with fiducial marker. Pleuroparenchymal disease and scarring  at the left base, question increased pleural density, possible effusion. Stable cardiomediastinal silhouette with aortic atherosclerosis IMPRESSION: 1. Hyperinflation with emphysema. Pleuroparenchymal disease and scarring at the left base, question increased pleural density, possible effusion. 2. Distortion in the right suprahilar lung with fiducial marker, reference chest CT 04/08/2024. Electronically Signed   By: Luke Bun M.D.   On: 06/28/2024 17:16    Microbiology: Results for orders placed or performed during the hospital encounter of 06/28/24  Culture, blood (routine x 2)     Status: None   Collection Time: 06/28/24  4:40 PM   Specimen: BLOOD  Result Value Ref Range Status   Specimen Description   Final    BLOOD LEFT ANTECUBITAL Performed at [facility], [address] [place], [place] [address]    Special Requests   Final    BOTTLES DRAWN AEROBIC AND ANAEROBIC Blood Culture adequate volume Performed at [facility], [address] [place], [place] [address]    Culture   Final    NO GROWTH 5 DAYS Performed at [facility], [address] [place], [place] [address]    Report Status 07/03/2024 FINAL  Final  Culture, blood (routine x 2)     Status: None   Collection Time: 06/28/24  4:45 PM   Specimen: BLOOD  Result Value Ref Range Status   Specimen Description   Final    BLOOD BLOOD RIGHT ARM Performed at [facility], [address] [place], [place] [address]    Special Requests   Final    BOTTLES DRAWN AEROBIC ONLY Blood Culture adequate volume Performed at [facility], [address], [place], [place] [address]    Culture   Final    NO GROWTH 5 DAYS Performed at [facility], [address], [place], [place] [address]    Report Status 07/03/2024 FINAL  Final  Resp panel by RT-PCR (RSV, Flu A&B, Covid) Peripheral     Status: None   Collection Time: 06/28/24  4:54 PM    Specimen: Peripheral; Nasal Swab  Result Value Ref Range Status   SARS Coronavirus 2 by RT PCR NEGATIVE NEGATIVE Final    Comment: (NOTE) SARS-CoV-2 target nucleic acids are NOT DETECTED.  The SARS-CoV-2 RNA is generally detectable in upper respiratory specimens during the acute phase of infection. The lowest concentration of SARS-CoV-2 viral copies this assay can detect is 138 copies/mL. A negative result does  not preclude SARS-Cov-2 infection and should not be used as the sole basis for treatment or other patient management decisions. A negative result may occur with  improper specimen collection/handling, submission of specimen other than nasopharyngeal swab, presence of viral mutation(s) within the areas targeted by this assay, and inadequate number of viral copies(<138 copies/mL). A negative result must be combined with clinical observations, patient history, and epidemiological information. The expected result is Negative.  Fact Sheet for Patients:  BloggerCourse.com  Fact Sheet for Healthcare Providers:  SeriousBroker.it  This test is no t yet approved or cleared by the [place]  FDA and  has been authorized for detection and/or diagnosis of SARS-CoV-2 by FDA under an Emergency Use Authorization (EUA). This EUA will remain  in effect (meaning this test can be used) for the duration of the COVID-19 declaration under Section 564(b)(1) of the Act, 21 U.S.C.section 360bbb-3(b)(1), unless the authorization is terminated  or revoked sooner.       Influenza A by PCR NEGATIVE NEGATIVE Final   Influenza B by PCR NEGATIVE NEGATIVE Final    Comment: (NOTE) The Xpert Xpress SARS-CoV-2/FLU/RSV plus assay is intended as an aid in the diagnosis of influenza from Nasopharyngeal swab specimens and should not be used as a sole basis for treatment. Nasal washings and aspirates are unacceptable for Xpert Xpress  SARS-CoV-2/FLU/RSV testing.  Fact Sheet for Patients: BloggerCourse.com  Fact Sheet for Healthcare Providers: SeriousBroker.it  This test is not yet approved or cleared by the [place]  FDA and has been authorized for detection and/or diagnosis of SARS-CoV-2 by FDA under an Emergency Use Authorization (EUA). This EUA will remain in effect (meaning this test can be used) for the duration of the COVID-19 declaration under Section 564(b)(1) of the Act, 21 U.S.C. section 360bbb-3(b)(1), unless the authorization is terminated or revoked.     Resp Syncytial Virus by PCR NEGATIVE NEGATIVE Final    Comment: (NOTE) Fact Sheet for Patients: BloggerCourse.com  Fact Sheet for Healthcare Providers: SeriousBroker.it  This test is not yet approved or cleared by the [place]  FDA and has been authorized for detection and/or diagnosis of SARS-CoV-2 by FDA under an Emergency Use Authorization (EUA). This EUA will remain in effect (meaning this test can be used) for the duration of the COVID-19 declaration under Section 564(b)(1) of the Act, 21 U.S.C. section 360bbb-3(b)(1), unless the authorization is terminated or revoked.  Performed at [facility], [address] [place], [place] [address]     Labs: CBC: Recent Labs  Lab 06/28/24 1654 06/29/24 0549 06/30/24 0556 07/01/24 0523 07/03/24 0535  WBC 6.2 4.8 9.9 10.6* 11.0*  NEUTROABS  --  3.8  --  9.8* 9.7*  HGB 12.8 12.6 12.2 11.7* 12.2  HCT 39.0 38.8 38.3 36.6 38.1  MCV 87.4 88.0 89.5 89.5 89.6  PLT 334 336 362 388 406*   Basic Metabolic Panel: Recent Labs  Lab 06/28/24 1654 06/29/24 0549 06/30/24 0556 07/01/24 0523 07/03/24 0535  NA 136 140 136 137 139  K 4.0 4.2 4.6 5.0 4.3  CL 97* 101 99 101 98  CO2 28 26 27 30  32  GLUCOSE 130* 132* 180* 235* 189*  BUN 9 10 17 21 16   CREATININE 0.85 0.71  0.74 0.85 0.60  CALCIUM  9.4 9.4 9.1 8.7* 9.0  MG  --  2.8* 2.6*  --   --   PHOS  --  4.4  --   --   --    Liver Function Tests: Recent Labs  Lab 06/29/24 0549  AST 38  ALT 15  ALKPHOS 49  BILITOT 0.7  PROT 7.6  ALBUMIN 3.7   CBG: Recent Labs  Lab 07/03/24 1326 07/03/24 1624 07/03/24 2040 07/04/24 0808 07/04/24 1156  GLUCAP 324* 238* 203* 104* 358*    Discharge time spent: greater than 30 minutes.  Signed: Delon Herald, MD [facility] 07/04/2024

## 2024-07-04 NOTE — Progress Notes (Addendum)
 Discharge instructions were reviewed with the patient.Extensive education provided regarding inhalers/nebulizer treatments. She denied additional questions or concerns at this time. IV removed, site is clean dry and intact.

## 2024-07-04 NOTE — Plan of Care (Signed)

## 2024-07-04 NOTE — Progress Notes (Signed)
 Discharge completed by provided by Amber,M. Oxygen delivered to patient, education and follow up provided and daughter at bedside to take patient home. Pt was discharged off unit.

## 2024-07-04 NOTE — Progress Notes (Signed)
 Pt/pt daughter called for additional information about the DME company. Pt stated that they lost the phone number or paper given by the DME company. Paged Child psychotherapist. DME company given to patient via phone - [employer].

## 2024-07-04 NOTE — Progress Notes (Incomplete)
 NAME:  [name], MRN:  [id number], DOB:  August 23, 1946, LOS: 5 ADMISSION DATE:  06/28/2024, CONSULTATION DATE: 07/02/2024 REFERRING MD: Delon Herald, MD, CHIEF COMPLAINT: COPD exacerbation  History of Present Illness:  The patient, with moderate COPD and right lung cancer, presents with worsening shortness of breath and wheezing.  Dyspnea and wheezing - Worsening shortness of breath and wheezing, particularly during ambulation - Symptoms significantly impact daily activities - Symptoms became more pronounced during a recent cruise to [place]  - Required hospitalization for one week without significant improvement - Started on supplemental oxygen during this admission - No recent exposure to sick contacts during cruise - Suspects environmental factors at home, such as particles from the ceiling, may contribute to respiratory issues  Cough and respiratory symptoms - Intermittent episodes of coughing lasting for months, followed by periods of hard breathing - Current episode is severe and persistent - Occasional snoring or wheezing at night, with suspicion of possible sleep apnea - Husband has attempted to record nocturnal symptoms  Chronic obstructive pulmonary disease (copd) and lung cancer history - Moderate COPD - Diagnosed with right lung cancer in 2022, treated with radiation therapy  Laryngeal and vocal cord edema - Evaluated by ENT in 2020 with findings of moderate laryngeal edema suggestive of LPR - No recent follow-up with ENT as previously recommended  Tobacco use history - Quit smoking in 2007  Current outpatient medications - Breztri , twice daily - Protonix  in the morning for acid reflux  Pertinent  Medical History    has a past medical history of Allergy  (09/30/1996), Arthritis, Asthma, Complication of anesthesia, COPD (chronic obstructive pulmonary disease) (HCC), Diabetes (HCC), High cholesterol, History of radiation therapy, Hypertension, Lung cancer (HCC),  Neuromuscular disorder (HCC), Pneumonia (09/10/2017), PONV (postoperative nausea and vomiting), and Stroke (HCC) (2007).   Significant Hospital Events: Including procedures, antibiotic start and stop dates in addition to other pertinent events   6/30 admit 7/4 PCCM consulted for slow to resolve exacerbation  Interim History / Subjective:   Feels better  Objective    Blood pressure (!) 159/79, pulse 90, temperature 98.3 F (36.8 C), resp. rate (!) 22, height 5' 6 (1.676 m), weight 43.7 kg, SpO2 100%.       No intake or output data in the 24 hours ending 07/04/24 1216  Filed Weights   07/01/24 0433 07/02/24 0429 07/03/24 0439  Weight: 43.2 kg 44.6 kg 43.7 kg    Examination: Gen:      No acute distress HEENT:  EOMI, sclera anicteric Neck:     No masses; no thyromegaly Lungs:   Bilateral rhonchi, probably wheeze CV:         Regular rate and rhythm; no murmurs Abd:      + bowel sounds; soft, non-tender; no palpable masses, no distension Ext:    No edema; adequate peripheral perfusion Neuro: alert and oriented x 3 Psych: normal mood and affect   Lab/imaging reviewed BMP stable, WBC 11.0, platelets 406  Spirometry 08/10/2017-moderate obstruction CTA on 6/30-no pulm embolism, posttreatment scarring in the right suprahilar area with soft tissue fullness, 3 mm nodule in the left upper lobe, stable 5 mm nodule in the right middle lobe  Resolved problem list   Assessment and Plan  Chronic Obstructive Pulmonary Disease (COPD) Moderate COPD with recent exacerbation characterized by increased dyspnea and wheezing, worsened post-cruise. Oxygen therapy initiated due to exertional hypoxemia.  I suspect persistent wheeze is more due to upper airway issues Current outpatient medications include Breztri  and prednisone ,  with nebulizer treatments providing limited relief. - Continue Yupelri , Brovana  and Pulmicort  nebs, DuoNebs as needed.   - Continue steroids ( On Solumedrol 80 mg q12) at  current dose and singulair  -Tessalon  Perles and cough medication -Check ambulatory sats and home oxygen therapy if oxygen saturation drops below 90% during exertion. - Reassess need for home oxygen therapy at follow-up.  Vocal cord dysfunction Suspected vocal cord dysfunction contributing to respiratory difficulties. 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 Referral has been made as an outpatient to ENT specialist for further evaluation and management is pending.  Gastroesophageal reflux disease (GERD) GERD potentially contributing to vocal cord dysfunction and respiratory symptoms. Currently on Protonix  once daily with no significant acid reflux symptoms reported. - Increase Protonix  to twice daily to manage potential reflux contributing to vocal cord issues.  Pepcid  at night  Lung cancer Right lung cancer diagnosed in 2022, treated with radiation. No current evidence of recurrence or complications on recent CT scan.  Best Practice (right click and Reselect all SmartList Selections daily)   Per primary team  Signature:   Jahkai Yandell MD [facility] See Amion for pager  If no response to pager , please call [phone] until 7pm After 7:00 pm call Elink  [phone] 07/04/2024, 12:16 PM

## 2024-07-05 ENCOUNTER — Telehealth: Payer: Self-pay

## 2024-07-05 NOTE — Transitions of Care (Post Inpatient/ED Visit) (Signed)
 07/05/2024  Name: [name] MRN: [id number] DOB: [birth year]  Today's TOC FU Call Status: Today's TOC FU Call Status:: Successful TOC FU Call Completed TOC FU Call Complete Date: 07/05/24 Patient's Name and Date of Birth confirmed.  Transition Care Management Follow-up Telephone Call Date of Discharge: 07/04/24 Discharge Facility: [facility]) Type of Discharge: Inpatient Admission Any questions or concerns?: Yes Patient Questions/Concerns Addressed: Provided Patient Educational Materials  Items Reviewed: Did you receive and understand the discharge instructions provided?: Yes Medications obtained,verified, and reconciled?: Yes (Medications Reviewed) Any new allergies since your discharge?: No Dietary orders reviewed?: NA Do you have support at home?: No  Medications Reviewed Today: Medications Reviewed Today     Reviewed by Craven Adelia PARAS, CMA (Certified Medical Assistant) on 07/05/24 at [phone]  Med List Status: <None>   Medication Order Taking? Sig Documenting Provider Last Dose Status Informant  albuterol  (PROVENTIL ) (2.5 MG/3ML) 0.083% nebulizer solution [id number] Yes Take 3 mLs (2.5 mg total) by nebulization every 4 (four) hours as needed. Darlean Ozell NOVAK, MD  Active Self, Pharmacy Records  albuterol  (VENTOLIN  HFA) 108 [phone] Base) MCG/ACT inhaler [id number] Yes INHALE 2 PUFFS INTO THE LUNGS EVERY 4 HOURS AS NEEDED FOR WHEEZING OR SHORTNESS OF SHERIDA Jarold Medici, MD  Active Self, Pharmacy Records  arformoterol  (BROVANA ) 15 MCG/2ML NEBU [id number] Yes Take 2 mLs (15 mcg total) by nebulization 2 (two) times daily. Barbarann Nest, MD  Active   aspirin  81 MG tablet [id number] Yes Take 81 mg by mouth daily. [provider]  Active Self, Pharmacy Records  atorvastatin  (LIPITOR) 80 MG tablet [id number] Yes TAKE 1 TABLET BY MOUTH DAILY ON MONDAY-FRIDAY.  Patient taking differently: Take 80 mg by mouth daily.   Jarold Medici, MD  Active Self, Pharmacy Records   benzonatate  (TESSALON ) 200 MG capsule [id number] Yes Take 1 capsule (200 mg total) by mouth 3 (three) times daily. Barbarann Nest, MD  Active   Biotin 89999 MCG TBDP [id number] Yes Take 10,000 mcg by mouth daily. [provider]  Active Self, Pharmacy Records  bisoprolol  (ZEBETA ) 5 MG tablet [id number] Yes TAKE 1/2 TABLET BY MOUTH EVERY DAY Jarold Medici, MD  Active Self, Pharmacy Records  budesonide  (PULMICORT ) 0.5 MG/2ML nebulizer solution [id number] Yes Take 2 mLs (0.5 mg total) by nebulization 2 (two) times daily. Barbarann Nest, MD  Active   CALCIUM -MAGNESIUM -ZINC PO [id number] Yes Take 1 capsule by mouth at bedtime. [provider]  Active Self, Pharmacy Records  Cholecalciferol (VITAMIN D3) 2000 UNITS TABS [id number] Yes Take 2,000 Units by mouth at bedtime. [provider]  Active Self, Pharmacy Records  DULoxetine  (CYMBALTA ) 60 MG capsule [id number] Yes TAKE 1 CAPSULE(60 MG) BY MOUTH DAILY Jarold Medici, MD  Active Self, Pharmacy Records  Evolocumab  (REPATHA  SURECLICK) 140 MG/ML EMMANUEL [id number] Yes Inject 140 mg into the skin every 14 (fourteen) days. Mona Vinie JAYSON, MD  Active Self, Pharmacy Records  famotidine  (PEPCID ) 20 MG tablet [id number] Yes Take 1 tablet (20 mg total) by mouth daily after supper. Neda Jennet LABOR, MD  Active Self, Pharmacy Records  HYDROcodone  bit-homatropine [facility]) 5-1.5 MG/5ML syrup [id number] Yes Take 5 mLs by mouth every 4 (four) hours as needed for cough. Barbarann Nest, MD  Active   ipratropium-albuterol  (DUONEB) 0.5-2.5 (3) MG/3ML SOLN [id number] Yes Take 3 mLs by nebulization every 4 (four) hours as needed. Barbarann Nest, MD  Active   Magnesium  250 MG TABS [id number] Yes Take 1 tablet by mouth daily. [provider]  Active Self, Pharmacy Records  methocarbamol  (ROBAXIN ) 500 MG tablet [id number] Yes Take 1 tablet (500 mg total) by mouth every 8 (eight) hours as needed for muscle spasms. Barbarann Nest, MD  Active    montelukast  (SINGULAIR ) 10 MG tablet [id number] Yes TAKE 1 TABLET(10 MG) BY MOUTH AT BEDTIME Neda Jennet LABOR, MD  Active Self, Pharmacy Records  pantoprazole  (PROTONIX ) 40 MG tablet [id number] Yes TAKE 1 TABLET BY MOUTH 30 MINUTES BEFORE FIRST MEAL OF THE DAY Jarold Medici, MD  Active Self, Pharmacy Records  predniSONE  (DELTASONE ) 10 MG tablet [id number] Yes Take 4 tablets (40 mg total) by mouth daily with breakfast for 5 days, THEN 3 tablets (30 mg total) daily with breakfast for 5 days, THEN 2 tablets (20 mg total) daily with breakfast for 5 days, THEN 1 tablet (10 mg total) daily with breakfast for 5 days, THEN 0.5 tablets (5 mg total) daily with breakfast for 5 days. Barbarann Nest, MD  Active   pregabalin  (LYRICA ) 100 MG capsule [id number] Yes Take 1 capsule (100 mg total) by mouth 3 (three) times daily.  Patient taking differently: Take 100 mg by mouth 2 (two) times daily.   Jarold Medici, MD  Active Self, Pharmacy Records  promethazine  (PHENERGAN ) 25 MG tablet [id number] Yes Take 25 mg by mouth every 6 (six) hours as needed for nausea or vomiting. [provider]  Active Self, Pharmacy Records  revefenacin  (YUPELRI ) 175 MCG/3ML nebulizer solution [id number] Yes Take 3 mLs (175 mcg total) by nebulization daily. Barbarann Nest, MD  Active   Semaglutide , 1 MG/DOSE, (OZEMPIC , 1 MG/DOSE,) 4 MG/3ML SOPN [id number] Yes Inject 1 mg into the skin once a week. Friday  Patient taking differently: Inject 1 mg into the skin once a week. Sundays   Jarold Medici, MD  Active Self, Pharmacy Records           Med Note JACKOLYN WADDELL DEL   Tue Jun 29, 2024  8:38 AM) Gets through doctor. Dispense records may not show.             Home Care and Equipment/Supplies: Were Home Health Services Ordered?: NA Any new equipment or medical supplies ordered?: NA  Functional Questionnaire: Do you need assistance with bathing/showering or dressing?: No Do you need assistance with meal preparation?:  No Do you need assistance with eating?: No Do you have difficulty maintaining continence: No Do you need assistance with getting out of bed/getting out of a chair/moving?: No Do you have difficulty managing or taking your medications?: No  Follow up appointments reviewed: PCP Follow-up appointment confirmed?: Yes Date of PCP follow-up appointment?: 07/12/24 Follow-up Provider: Medici Jarold MD Specialist Hospital Follow-up appointment confirmed?: Yes Date of Specialist follow-up appointment?: 07/07/24 Follow-Up Specialty Provider:: Dr.Wert Do you need transportation to your follow-up appointment?: No Do you understand care options if your condition(s) worsen?: Yes-patient verbalized understanding    SIGNATUREYL,RMA

## 2024-07-05 NOTE — Transitions of Care (Post Inpatient/ED Visit) (Signed)
   07/05/2024  Name: [name] MRN: [id number] DOB: 18-Mar-1946  Today's TOC FU Call Status: Today's TOC FU Call Status:: Unsuccessful Call (1st Attempt)  Attempted to reach the patient regarding the most recent Inpatient/ED visit. Patient called back and was not up to talking.  States she will see her Pulmonologist 07/07/24 and have a better understanding of what's going on with her and her medications. Was awaiting medications to be filled by her pharmacy.   Follow Up Plan: Additional outreach attempts will be made to reach the patient to complete the Transitions of Care (Post Inpatient/ED visit) call.   Plan to follow up on 07/07/24 after her appointment with pulmonologist, Dr. Darlean.  Richerd Fish, RN, BSN, CCM [facility], [facility] RN Care Manager Direct Dial: [phone]

## 2024-07-05 NOTE — Progress Notes (Unsigned)
 Subjective:   Patient ID: [name], female    DOB: 1946-11-02    MRN: [id number]   Brief patient profile:  62 yobf mother of a PA @ [place]  fast med  quit smoking 2007 with chronic cough x 2011 self referred for evaluation of persistent cough in setting of technically GOLD II copd dx 02/2014 with only mild obstructive pattern - has noted since high school a tendency to daily  tickle in throat causing intermittent cough waxing and waning ever since    History of Present Illness  10/19/2014 1st [place] [facility] [place] [facility] office visit/ [name]   Chief Complaint  Patient presents with   Pulmonary Consult    Former pt of Dr. Neysa. Pt c/o increased cough x 3 wks. Cough is prod with moderate, thick, light yellow sputum.  She coughs until loses urinary continence.  Not sleeping well due to cough.   remembers having tendency to throat tickle in high school  but completely resolved by graduation  then recurred in her 36s and resolved with codeine  then recurred in 2011  but bad to worse x 3 weeks assoc with sensation of choking, mucus is thick light yellow.  acei stopped about a month prior to OV  Replaced by losartan   Inhalers make it worse, esp dpi  Kouffman Reflux v Neurogenic Cough Differentiator Reflux Comments  Do you awaken from a sound sleep coughing violently?                            With trouble breathing? Yes   Do you have choking episodes when you cannot  Get enough air, gasping for air ?              Yes   Do you usually cough when you lie down into  The bed, or when you just lie down to rest ?                          Yes   Do you usually cough after meals or eating?         maybe   Do you cough when (or after) you bend over?    No    GERD SCORE     Kouffman Reflux v Neurogenic Cough Differentiator Neurogenic   Do you more-or-less cough all day long? yes   Does change of temperature make you cough? no   Does laughing or chuckling cause you to cough? no   Do fumes (perfume,  automobile fumes, burned  Toast, etc.,) cause you to cough ?      Not aware   Does speaking, singing, or talking on the phone cause you to cough   ?               sometimes   Neurogenic/Airway score      rec  Max rx gerd/prn tramadol         09/15/2017  Transition of care  f/u ov/[name] re: COPD II s/p aecopd Chief Complaint  Patient presents with   Hospitalization Follow-up    Breathing has improved some, but not back at her normal baseline. She states her cough woke her up this am and had some bloody nasal d/c.  She has been coughing up some thick, yellow sputum.  She is using   walking all over [place] fine,  Flew back to [place]  on a commercial jet and short of breath  walking from gate [place] then by 2 days later in ER due to severe sob / tightness across upper back >> albuterol  relived the discomfort w/in 5 min but did not have her rescue so had to call 911 (proair  listed on med calendar she just got from me 08/19/17 under written action plan) - says was so short of breath at that point could not have used saba anyway. Not following action plan either re controlling cough which is also worse/ has not started meds from discharge  rec Plan A = Automatic = symbicort  80 Take 2 puffs first thing in am and then another 2 puffs about 12 hours later.  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 She rarely uses her albuterol  inhaler, but has been using neb almost every night.   Dyspnea:  Gets up around 630 am does better after taking am breztri    Cough: sporadic but dry  / less need for tramadol  on gabapentin  usually just taking 300 bid  Sleeping: fine 2 pillows  SABA use: every other night sev hours p supper  feels like needs  nebulizer even p pm dose of breztri  02: none rec Change gabapentin  to 300 mg 4 x daily  Change pepcid  20 mg after supper  Only use your albuterol  as a rescue medication  Bed blocks are strongly recommended      07/23/21 televisit Plan A = Automatic = Always=    Breztri  Take 2 puffs first thing in am and then another 2 puffs about 12 hours later.  Gabapentin  300 mg four times daily until cough is gone then taper twice daily.  Plan B = Backup (to supplement plan A, not to replace it) Only use your albuterol  inhaler as a rescue medication  Plan C = Crisis (instead of Plan B but only if Plan B stops working) - only use your albuterol  nebulizer if you first try Plan B and it fails to help > ok to use the nebulizer up to every 4 hours but if start needing it regularly call for immediate appointment Prednisone  10 mg take  4 each am x 2 days,   2 each am x 2 days,  1 each am x 2 days and stop  Please schedule a follow up office visit in 6 weeks, call sooner if needed - bring all your medications /inhalers/ solutions Check cxr  08/05/21 ? ILD > HRCT > RUL nodule > PET pos stage I  > adeno ca  RT curative  12/04/2021 through 12/11/2021   05/01/2022 acute extended ov/[name] re: sob/cough/ wheeze maint on breztri  2 bid  Chief Complaint  Patient presents with   Acute Visit    Increased SOB, cough and wheezing x 3 days. SOB waking her up. She is coughing up thick, off white sputum.   Worse x 3 days / no recent  prednisone  assoc with worse cough x sev weeks silvery mucus no blood / assoc subjective wheeze/ no obvious assoc rhinitis / mild dysphagia but no worse p meals  L cp now seeing neurology / pain clinic / not pleuritic  Rec Plan A = Automatic = Always=   Breztri  Take  2 puffs first thing in am and then another 2 puffs about 12 hours later.  Gabapentin  300 mg four times a daily  Work on inhaler technique:  Plan B = Backup (to supplement plan A, not to replace it) (unless cough is the issue in which  case more on to plan C) Only use your albuterol  inhaler as a rescue medication  Plan C = Crisis (instead of Plan B but only if Plan B stops working) - only use your albuterol  nebulizer (ok to use up duoneb which has ipatrpium) if you first try Plan B  Plan D = Deltasone  10 mg  if  ABC not helping  Prednisone  10 mg take  4 each am x 2 days,   2 each am x 2 days,  1 each am x 2 days and stop  For cough > mucinex  dm 1200 mg every 12 hours as needed as use flutter valve with oxycodone  supplement     03/17/2024  f/u ov/[name] re: AB/cough   maint on duoneb   did  bring meds not bring flutter / was not able to recall the above written action plan and did not know Plan D was renewable and ran out months ago.  Chief Complaint  Patient presents with   Follow-up  Dyspnea:  worse since stopped breztri   Cough: thick beige  Sleeping: bed is flat with 3 pillows  SABA use: duoneb (as supposed to be saba only) 02: none Rec Plan A = Automatic = Always=   Breztri  Take 2 puffs first thing in am and then another 2 puffs about 12 hours later.  Gabapentin  300 mg four times a daily  Work on inhaler technique: Plan B = Backup (to supplement plan A, not to replace it) (unless cough is the issue in which case more on to plan C) Only use your albuterol  inhaler as a rescue medication  Plan C = Crisis (instead of Plan B but only if Plan B stops working) - only use your albuterol  nebulizer (ok to use up duoneb which has ipatrpium) if you first try Plan B  Plan D = Deltasone  10 mg  if  ABC not helping  Prednisone  10 mg 2 until better then one daily x 5 d and stop For cough > mucinex  dm 1200 mg every 12 hours as needed as use flutter valve with oxycodone  supplement     Please schedule a follow up visit in 2 months but call sooner if needed    Admit date:     06/28/2024  Discharge date: 07/04/24    Recommendations at discharge:    You are being discharged home with oxygen Prolonged prednisone  taper is  prescribed Continue Singulair , Albuterol  Stop Breztri  and start Brovana  (arformoterol ), Pulmicort  (budesonide ), Duonebs, and Yupelri  (revefenacin ) Take Tessalon  perles and Hycodan cough syrup as needed  Follow up with week with Dr. Jarold Follow up with pulmonology in 1-2 weeks; call for an appointment Follow up with ENT; referral placed   Discharge Diagnoses: Principal Problem:   COPD with acute exacerbation (HCC)   Essential hypertension   DM2 (diabetes mellitus, type 2) (HCC)   Chronic pain   HLD (hyperlipidemia)   Chronic diastolic CHF (congestive heart failure) [facility])       Hospital Course: 77yo with h/o COPD, DM, HTN, HLD, prior lung cancer (completed radiation in 2022), and chronic diastolic dysfunction who presented on 6/30 with SOB.  She was treated for a COPD exacerbation.     Assessment and Plan:  Acute COPD exacerbation  Patient with new O2 requirement, significant wheezing despite current treatments Chest x-ray/CTA is not consistent with pneumonia Nebulizers: scheduled Duoneb and prn albuterol  Solu-Medrol  80 mg IV BID -> Prednisone  prolonged taper Continues to have new O2 requirement, will dc home with O2 Given her persistent symptoms, pulmonology was consulted Dr. Theophilus suspected  upper airway issues with VCD On Yupelri , Brovana , Pulmicort , and Duonebs as well as steroids and Tessalon  perles and Hycodan cough syrup Continue Singulair  Needs outpatient ENT f/u   HTN Continue bisoprolol  Continue ASA   HLD Continue atorvastatin  Also on Repatha  as an outpatient   H/o lung cancer Stage 1 adenocarcinoma Completed radiation therapy in 11/2021 No obvious recurrence on current imaging   DM Recent A1c 6.4, good control Not on medications other than Ozempic  - recommend holding as she is currently underweight Will cover with moderate-scale SSI Continue pregabalin     Chronic pain I have reviewed this patient in the [place] Controlled Substances Reporting System.   She is receiving medications from only one provider and appears to be taking them as prescribed. She is not at particularly high risk of overdose but is at increased risk of opioid misuse or diversion.  Continue Lyrica , duloxetine  No longer taking Percocet   Chronic HFpEF Most recent echo was in 07/2021 Appears compensated Continue ASA   GERD Continue famotidine  and pantoprazole            07/07/2024  post hosp f/u ov/[name] re: GOLD 2 copd plus VCD   maint on no rx since d/c as thoroughly confused instructions and hasn't filled any rx including prednisone   Dyspnea:  not back to baseline - just got back from cruise to [place]  prior to aecopd > admit Cough: little bit of a rattle > min vol = yellowish  Sleeping: flat bed 3 pillows s  resp cc  SABA use: rarely (not used on day of ov)  02:  concentrator 2lpm hs and prn     No obvious day to day or daytime variability or assoc   mucus plugs or hemoptysis or cp or chest tightness, subjective wheeze or overt sinus or hb symptoms.    Also denies any obvious fluctuation of symptoms with weather or environmental changes or other aggravating or alleviating factors except as outlined above   No unusual exposure hx or h/o childhood pna/ asthma or knowledge of premature birth.  Current Allergies, Complete Past Medical History, Past Surgical History, Family History, and Social History were reviewed in [employer] record.  ROS  The following are not active complaints unless bolded Hoarseness, sore throat, dysphagia, dental problems, itching, sneezing,  nasal congestion or discharge of excess mucus or purulent secretions, ear ache,   fever, chills, sweats, unintended wt loss or wt gain, classically pleuritic or exertional cp,  orthopnea pnd or arm/hand swelling  or leg swelling, presyncope, palpitations, abdominal pain, anorexia, nausea, vomiting, diarrhea  or change in bowel habits or change in bladder habits, change in stools or  change in urine, dysuria, hematuria,  rash, arthralgias, visual complaints, headache, numbness, weakness or ataxia or problems with walking or coordination,  change in mood or  memory.        Current Meds  Medication Sig   albuterol  (PROVENTIL ) (2.5 MG/3ML) 0.083% nebulizer solution Take 3 mLs (2.5 mg total) by nebulization every 4 (four) hours as needed.   albuterol  (VENTOLIN  HFA) 108 (90 Base) MCG/ACT inhaler INHALE 2 PUFFS INTO THE LUNGS EVERY 4 HOURS AS NEEDED FOR WHEEZING  OR SHORTNESS OF BREATH   arformoterol  (BROVANA ) 15 MCG/2ML NEBU Take 2 mLs (15 mcg total) by nebulization 2 (two) times daily.   aspirin  81 MG tablet Take 81 mg by mouth daily.   atorvastatin  (LIPITOR) 80 MG tablet TAKE 1 TABLET BY MOUTH DAILY ON MONDAY-FRIDAY. (Patient taking differently: Take 80 mg by mouth daily.)   benzonatate  (TESSALON ) 200 MG capsule Take 1 capsule (200 mg total) by mouth 3 (three) times daily.   Biotin 89999 MCG TBDP Take 10,000 mcg by mouth daily.   bisoprolol  (ZEBETA ) 5 MG tablet TAKE 1/2 TABLET BY MOUTH EVERY DAY   budesonide  (PULMICORT ) 0.5 MG/2ML nebulizer solution Take 2 mLs (0.5 mg total) by nebulization 2 (two) times daily.   CALCIUM -MAGNESIUM -ZINC PO Take 1 capsule by mouth at bedtime.   Cholecalciferol (VITAMIN D3) 2000 UNITS TABS Take 2,000 Units by mouth at bedtime.   DULoxetine  (CYMBALTA ) 60 MG capsule TAKE 1 CAPSULE(60 MG) BY MOUTH DAILY   Evolocumab  (REPATHA  SURECLICK) 140 MG/ML SOAJ Inject 140 mg into the skin every 14 (fourteen) days.   famotidine  (PEPCID ) 20 MG tablet Take 1 tablet (20 mg total) by mouth daily after supper.   HYDROcodone  bit-homatropine (HYCODAN) 5-1.5 MG/5ML syrup Take 5 mLs by mouth every 4 (four) hours as needed for cough.   ipratropium-albuterol  (DUONEB) 0.5-2.5 (3) MG/3ML SOLN Take 3 mLs by nebulization every 4 (four) hours as needed.   Magnesium  250 MG TABS Take 1 tablet by mouth daily.   methocarbamol  (ROBAXIN ) 500 MG tablet Take 1 tablet (500 mg total) by  mouth every 8 (eight) hours as needed for muscle spasms.   montelukast  (SINGULAIR ) 10 MG tablet TAKE 1 TABLET(10 MG) BY MOUTH AT BEDTIME   pantoprazole  (PROTONIX ) 40 MG tablet TAKE 1 TABLET BY MOUTH 30 MINUTES BEFORE FIRST MEAL OF THE DAY   predniSONE  (DELTASONE ) 10 MG tablet Take 4 tablets (40 mg total) by mouth daily with breakfast for 5 days, THEN 3 tablets (30 mg total) daily with breakfast for 5 days, THEN 2 tablets (20 mg total) daily with breakfast for 5 days, THEN 1 tablet (10 mg total) daily with breakfast for 5 days, THEN 0.5 tablets (5 mg total) daily with breakfast for 5 days.   pregabalin  (LYRICA ) 100 MG capsule Take 1 capsule (100 mg total) by mouth 3 (three) times daily. (Patient taking differently: Take 100 mg by mouth 2 (two) times daily.)   promethazine  (PHENERGAN ) 25 MG tablet Take 25 mg by mouth every 6 (six) hours as needed for nausea or vomiting.   revefenacin  (YUPELRI ) 175 MCG/3ML nebulizer solution Take 3 mLs (175 mcg total) by nebulization daily.   [Paused] Semaglutide , 1 MG/DOSE, (OZEMPIC , 1 MG/DOSE,) 4 MG/3ML SOPN Inject 1 mg into the skin once a week. Friday (Patient taking differently: Inject 1 mg into the skin once a week. Sundays)            Objective:   Physical Exam  wts   07/07/2024         173  03/17/2024       173  05/01/2022        185 09/13/2021      197   02/26/2021      191 11/21/2020    191  04/16/2019      193 07/22/2018      195 04/20/2018      189  10/23/2016     190 04/07/2015         20 7     Vital  signs reviewed  07/07/2024  - Note at rest 02 sats  98% on RA   General appearance:    amb bf nad   HEENT :  Oropharynx  clear   Nasal turbinates nl    NECK :  without JVD/Nodes/TM/ nl carotid upstrokes bilaterally   LUNGS: no acc muscle use,  Mod barrel  contour chest wall with exp wheeze resolves with plm and  without cough on insp or exp maneuvers and mod  Hyperresonant  to  percussion bilaterally     CV:  RRR  no s3 or murmur or increase in P2,  and no edema   ABD:  soft and nontender with pos mid insp Hoover's  in the supine position. No bruits or organomegaly appreciated, bowel sounds nl  MS:   Ext warm without deformities or   obvious joint restrictions , calf tenderness, cyanosis or clubbing  SKIN: warm and dry without lesions    NEURO:  alert, approp, nl sensorium with  no motor or cerebellar deficits apparent.

## 2024-07-07 ENCOUNTER — Ambulatory Visit: Admitting: Internal Medicine

## 2024-07-07 ENCOUNTER — Inpatient Hospital Stay: Admitting: Internal Medicine

## 2024-07-07 ENCOUNTER — Telehealth: Payer: Self-pay | Admitting: *Deleted

## 2024-07-07 ENCOUNTER — Encounter: Payer: Self-pay | Admitting: Internal Medicine

## 2024-07-07 ENCOUNTER — Telehealth: Payer: Self-pay

## 2024-07-07 VITALS — BP 108/58 | HR 117 | Temp 97.2°F | Ht 65.0 in | Wt 173.8 lb

## 2024-07-07 DIAGNOSIS — J4489 Other specified chronic obstructive pulmonary disease: Secondary | ICD-10-CM

## 2024-07-07 DIAGNOSIS — Z87891 Personal history of nicotine dependence: Secondary | ICD-10-CM

## 2024-07-07 DIAGNOSIS — J9601 Acute respiratory failure with hypoxia: Secondary | ICD-10-CM | POA: Diagnosis not present

## 2024-07-07 DIAGNOSIS — J45909 Unspecified asthma, uncomplicated: Secondary | ICD-10-CM

## 2024-07-07 MED ORDER — PREDNISONE 10 MG PO TABS: ORAL_TABLET | ORAL | 2 refills | Status: DC

## 2024-07-07 NOTE — Transitions of Care (Post Inpatient/ED Visit) (Signed)
   07/07/2024  Name: [name] MRN: [id number] DOB: October 02, 1946  Today's TOC FU Call Status: Today's TOC FU Call Status:: Unsuccessful Call (2nd Attempt) Unsuccessful Call (2nd Attempt) Date: 07/07/24  Attempted to reach the patient regarding the most recent Inpatient/ED visit.  Follow Up Plan: Additional outreach attempts will be made to reach the patient to complete the Transitions of Care (Post Inpatient/ED visit) call.   Andrea Dimes RN, BSN [place]  [facility] RN Care Manager [phone]

## 2024-07-07 NOTE — Transitions of Care (Post Inpatient/ED Visit) (Signed)
   07/07/2024  Name: [name] MRN: [id number] DOB: 07/26/1946  Today's TOC FU Call Status: Today's TOC FU Call Status:: Successful TOC FU Call Completed TOC FU Call Complete Date: 07/07/24 Patient's Name and Date of Birth confirmed.  Transition Care Management Follow-up Telephone Call Date of Discharge: 07/04/24 Discharge Facility: [facility]) Type of Discharge: Inpatient Admission How have you been since you were released from the hospital?: Better Any questions or concerns?: No (Dr. Darlean cleared up everything and answered all of my questions)  Items Reviewed: Did you receive and understand the discharge instructions provided?: Yes Medications obtained,verified, and reconciled?: No (Patient states Dr. Darlean already done that today and has cleared everything up for me. I'm good)  Medications Reviewed Today: Patient declines states Dr. Darlean went over everything with her this morning and she's declining review. Medications Reviewed Today   Medications were not reviewed in this encounter     Patient decline 30 day TOC program      Richerd Fish, RN, Scientist, research (physical sciences), CCM [employer], [facility] RN Care Manager Direct Dial: [phone]

## 2024-07-07 NOTE — Patient Instructions (Addendum)
 Plan A = Automatic = Always=    1st thing in am = Arformoterol  20 mcg  and budesonide  0.5 mg and then 12 hours repeat aformoterol   Plan B = Backup (to supplement plan A, not to replace it) Only use your albuterol  inhaler as a rescue medication to be used if you can't catch your breath by resting or doing a relaxed purse lip breathing pattern.  - The less you use it, the better it will work when you need it. - Ok to use the inhaler up to 2 puffs  every 4 hours if you must but call for appointment if use goes up over your usual need - Don't leave home without it !!  (think of it like the spare tire for your car)   Plan C = Crisis (instead of Plan B but only if Plan B stops working) - only use your albuterol -ipratropium nebulizer if you first try Plan B and it fails to help > ok to use the nebulizer up to every 4 hours but if start needing it regularly call for immediate appointment  Plan D = ABC not working  Prednisone  10 mg 2 until better then one daily x 5 d and stop (refillable)   For cough > Mucinex  dm 1200 mg every 12 hours as needed as use flutter valve supplement with tessalon (benzoate) 200 mg  if needed   Finish prednisone  from hospital   Continue 02 2lpm at bedtime and during the day need to keep your saturations over 90%   Please schedule a follow up office visit in 4  weeks, call sooner if needed > ok to see NP

## 2024-07-08 NOTE — Assessment & Plan Note (Signed)
 Quit smoking  2007  with technically GOLD 2 criteria 2015  - 03/17/2024  After extensive coaching inhaler device,  effectiveness =    85% > resume  breztri   - restarted pred as Plan D 03/17/2024  20 ub then 10 x 5 and dc/ - 07/07/2024 p admited changed to bud 0.5 q am and brovana  bid per neb with duoneb as backup    Group D (now reclassified as E) in terms of symptom/risk and laba/lama/ICS  therefore appropriate rx at this point >>>  for now leave off the LAMA and just use SAMA vis duoneb prn as apparently the upper airway symtpoms with pseudowheeze worsended on hfa  - refused yupelri  anyway due to cost.  For cough > Mucinex  dm 1200 mg every 12 hours as needed as use flutter valve supplement with tessalon (benzoate) 200 mg  if needed (no need for narcotic from my perspective)

## 2024-07-08 NOTE — Assessment & Plan Note (Signed)
 For now rec 2lpm hs and prn daytime:  Advised: Make sure you check your oxygen saturation  AT  your highest level of activity (not after you stop)   to be sure it stays over 90% and adjust  02 flow upward to maintain this level if needed but remember to turn it back to previous settings when you stop (to conserve your supply).          Each maintenance medication was reviewed in detail including emphasizing most importantly the difference between maintenance and prns and under what circumstances the prns are to be triggered using an action plan format where appropriate.  Total time for H and P, chart review, counseling, reviewing hfa/neb/ 02/ pulse ox  device(s) and generating customized AVS unique to this office visit / same day charting = 35 min post hosp eval

## 2024-07-09 ENCOUNTER — Encounter: Payer: Self-pay | Admitting: Internal Medicine

## 2024-07-12 ENCOUNTER — Encounter: Payer: Self-pay | Admitting: Internal Medicine

## 2024-07-12 ENCOUNTER — Ambulatory Visit: Admitting: Internal Medicine

## 2024-07-12 VITALS — BP 110/64 | HR 90 | Temp 99.5°F | Ht 65.0 in | Wt 180.4 lb

## 2024-07-12 DIAGNOSIS — R252 Cramp and spasm: Secondary | ICD-10-CM | POA: Diagnosis not present

## 2024-07-12 DIAGNOSIS — J383 Other diseases of vocal cords: Secondary | ICD-10-CM | POA: Diagnosis not present

## 2024-07-12 DIAGNOSIS — E1169 Type 2 diabetes mellitus with other specified complication: Secondary | ICD-10-CM | POA: Diagnosis not present

## 2024-07-12 DIAGNOSIS — E785 Hyperlipidemia, unspecified: Secondary | ICD-10-CM | POA: Diagnosis not present

## 2024-07-12 DIAGNOSIS — I110 Hypertensive heart disease with heart failure: Secondary | ICD-10-CM | POA: Diagnosis not present

## 2024-07-12 DIAGNOSIS — I5032 Chronic diastolic (congestive) heart failure: Secondary | ICD-10-CM | POA: Diagnosis not present

## 2024-07-12 DIAGNOSIS — I700 Atherosclerosis of aorta: Secondary | ICD-10-CM

## 2024-07-12 DIAGNOSIS — J441 Chronic obstructive pulmonary disease with (acute) exacerbation: Secondary | ICD-10-CM

## 2024-07-12 DIAGNOSIS — Z85118 Personal history of other malignant neoplasm of bronchus and lung: Secondary | ICD-10-CM | POA: Diagnosis not present

## 2024-07-12 DIAGNOSIS — G8929 Other chronic pain: Secondary | ICD-10-CM

## 2024-07-12 NOTE — Progress Notes (Signed)
 I,[name], CMA,acting as a Neurosurgeon for Tammy LOISE Slocumb, MD.,have documented all relevant documentation on the behalf of Tammy LOISE Slocumb, MD,as directed by  Tammy LOISE Slocumb, MD while in the presence of Tammy LOISE Slocumb, MD.  Subjective:  Patient ID: [name] , female    DOB: [birth year] , 78 y.o.   MRN: [id number]  Chief Complaint  Patient presents with   COPD    Patient presents today for a hospital follow up, patient was admitted to [place] on 06/28/24 and discharged on 07/04/24. Patient was diagnosed with COPD with acute exacerbation. Patient reports today she is feeling better and she has no concerns today. She met with pulmonary last week. She is now on O2, she said she was supposed to wear it all day but didn't want to carry it around today.    HPI Discussed the use of AI scribe software for clinical note transcription with the patient, who gave verbal consent to proceed.  History of Present Illness [name] is a 78 year old female with COPD who presents with breathing difficulties and follow-up after a recent hospitalization.  She was admitted to the hospital on June 30th and discharged on July 6th due to a COPD exacerbation. During her hospital stay, she was treated with oxygen, a prednisone  taper, and her inhaler regimen was adjusted. Breztri  was discontinued and replaced with Brovana  and Pulmicort . She reports being told to use oxygen at home, especially during sleep.  She continues to experience shortness of breath, which led to her recent hospitalization. A chest x-ray and CT scan during her hospital stay showed no pneumonia. She has been following up with pulmonary specialists since her discharge.  There is confusion regarding a referral for her breathing issues. Initially, she was advised to see an ENT, but later it was suggested she see [name], a speech therapist. She is awaiting further clarification and a referral to this specialist.  She recently  traveled to [place] , which involved significant walking, leading to fatigue and the need for a wheelchair at the airport. She did not wear a mask during the trip.  She thinks she got sick on her trip.  Her current medications include bisoprolol  for blood pressure, atorvastatin , and Repatha  for cholesterol. She also takes magnesium  daily and has stopped Breztri  and Ozempic . She experiences cramps in her hands, which are alleviated by medication, though she is unsure of the name. She reports drinking insufficient water, which may contribute to her cramps. She is on a new breathing regimen and reports that she thinks she is doing well. Diabetes She presents for her follow-up diabetic visit. She has type 2 diabetes mellitus. Her disease course has been stable. Pertinent negatives for hypoglycemia include no dizziness or headaches. Pertinent negatives for diabetes include no blurred vision, no polydipsia, no polyphagia and no polyuria. There are no hypoglycemic complications. Risk factors for coronary artery disease include diabetes mellitus, dyslipidemia, hypertension, obesity, sedentary lifestyle and post-menopausal. She has not had a previous visit with a dietitian. She participates in exercise three times a week. Her breakfast blood glucose is taken between 8-9 am. Her breakfast blood glucose range is generally 110-130 mg/dl. Eye exam is current.  Hypertension This is a chronic problem. The current episode started more than 1 year ago. The problem has been gradually improving since onset. The problem is controlled. Pertinent negatives include no blurred vision or headaches. The current treatment provides moderate improvement. Compliance problems include exercise.  Past Medical History:  Diagnosis Date   Allergy  09/30/1996   Arthritis    Asthma    Complication of anesthesia    COPD (chronic obstructive pulmonary disease) (HCC)    Diabetes (HCC)    High cholesterol    History of radiation therapy     Right Lung- 12/04/21-12/11/21- Dr. Lynwood Nasuti   Hypertension    Lung cancer [facility])    Neuromuscular disorder (HCC)    Pneumonia 09/10/2017   PONV (postoperative nausea and vomiting)    Stroke (HCC) 2007   mini strokes     Family History  Problem Relation Age of Onset   Heart murmur Mother    Heart attack Father    Diabetes Father    Diabetes Brother    Stroke Brother    Vision loss Brother    Vision loss Brother      Current Outpatient Medications:    albuterol  (VENTOLIN  HFA) 108 (90 Base) MCG/ACT inhaler, INHALE 2 PUFFS INTO THE LUNGS EVERY 4 HOURS AS NEEDED FOR WHEEZING OR SHORTNESS OF BREATH, Disp: 18 g, Rfl: 12   arformoterol  (BROVANA ) 15 MCG/2ML NEBU, Take 2 mLs (15 mcg total) by nebulization 2 (two) times daily., Disp: 120 mL, Rfl: 0   aspirin  81 MG tablet, Take 81 mg by mouth daily., Disp: , Rfl:    atorvastatin  (LIPITOR) 80 MG tablet, TAKE 1 TABLET BY MOUTH DAILY ON MONDAY-FRIDAY. (Patient taking differently: Take 80 mg by mouth daily.), Disp: 90 tablet, Rfl: 1   benzonatate  (TESSALON ) 200 MG capsule, Take 1 capsule (200 mg total) by mouth 3 (three) times daily., Disp: 20 capsule, Rfl: 0   Biotin 89999 MCG TBDP, Take 10,000 mcg by mouth daily., Disp: , Rfl:    bisoprolol  (ZEBETA ) 5 MG tablet, TAKE 1/2 TABLET BY MOUTH EVERY DAY, Disp: 45 tablet, Rfl: 3   budesonide  (PULMICORT ) 0.5 MG/2ML nebulizer solution, Take 2 mLs (0.5 mg total) by nebulization 2 (two) times daily., Disp: 120 mL, Rfl: 0   CALCIUM -MAGNESIUM -ZINC PO, Take 1 capsule by mouth at bedtime., Disp: , Rfl:    Cholecalciferol (VITAMIN D3) 2000 UNITS TABS, Take 2,000 Units by mouth at bedtime., Disp: , Rfl:    DULoxetine  (CYMBALTA ) 60 MG capsule, TAKE 1 CAPSULE(60 MG) BY MOUTH DAILY, Disp: 90 capsule, Rfl: 1   Evolocumab  (REPATHA  SURECLICK) 140 MG/ML SOAJ, Inject 140 mg into the skin every 14 (fourteen) days., Disp: 6 mL, Rfl: 3   famotidine  (PEPCID ) 20 MG tablet, Take 1 tablet (20 mg total) by mouth daily after  supper., Disp: 90 tablet, Rfl: 3   HYDROcodone  bit-homatropine (HYCODAN) 5-1.5 MG/5ML syrup, Take 5 mLs by mouth every 4 (four) hours as needed for cough., Disp: 120 mL, Rfl: 0   ipratropium-albuterol  (DUONEB) 0.5-2.5 (3) MG/3ML SOLN, Take 3 mLs by nebulization every 4 (four) hours as needed., Disp: 360 mL, Rfl: 0   Magnesium  250 MG TABS, Take 1 tablet by mouth daily., Disp: , Rfl:    methocarbamol  (ROBAXIN ) 500 MG tablet, Take 1 tablet (500 mg total) by mouth every 8 (eight) hours as needed for muscle spasms., Disp: 30 tablet, Rfl: 0   montelukast  (SINGULAIR ) 10 MG tablet, TAKE 1 TABLET(10 MG) BY MOUTH AT BEDTIME, Disp: 90 tablet, Rfl: 3   pantoprazole  (PROTONIX ) 40 MG tablet, TAKE 1 TABLET BY MOUTH 30 MINUTES BEFORE FIRST MEAL OF THE DAY, Disp: 90 tablet, Rfl: 2   predniSONE  (DELTASONE ) 10 MG tablet, 2 each am until better then 1 each am x 5 days and  stop, Disp: 100 tablet, Rfl: 2   pregabalin  (LYRICA ) 100 MG capsule, Take 1 capsule (100 mg total) by mouth 3 (three) times daily. (Patient taking differently: Take 100 mg by mouth 2 (two) times daily.), Disp: 90 capsule, Rfl: 3   promethazine  (PHENERGAN ) 25 MG tablet, Take 25 mg by mouth every 6 (six) hours as needed for nausea or vomiting., Disp: , Rfl:    [Paused] Semaglutide , 1 MG/DOSE, (OZEMPIC , 1 MG/DOSE,) 4 MG/3ML SOPN, Inject 1 mg into the skin once a week. Friday (Patient taking differently: Inject 1 mg into the skin once a week. Sundays), Disp: 3 mL, Rfl: 3   Allergies  Allergen Reactions   Other Swelling    HAIR DYE     Review of Systems  Constitutional: Negative.   Eyes:  Negative for blurred vision.  Respiratory: Negative.    Cardiovascular: Negative.   Gastrointestinal: Negative.   Endocrine: Negative for polydipsia, polyphagia and polyuria.  Neurological: Negative.  Negative for dizziness and headaches.  Psychiatric/Behavioral: Negative.       Today's Vitals   07/12/24 1208  BP: 110/64  Pulse: 90  Temp: 99.5 F (37.5 C)   TempSrc: Oral  SpO2: 95%  Weight: 180 lb 6.4 oz (81.8 kg)  Height: 5' 5 (1.651 m)  PainSc: 0-No pain   Body mass index is 30.02 kg/m.  Wt Readings from Last 3 Encounters:  07/12/24 180 lb 6.4 oz (81.8 kg)  07/07/24 173 lb 12.8 oz (78.8 kg)  07/03/24 96 lb 5.5 oz (43.7 kg)     Objective:  Physical Exam Vitals and nursing note reviewed.  Constitutional:      Appearance: Normal appearance.  HENT:     Head: Normocephalic and atraumatic.  Eyes:     Extraocular Movements: Extraocular movements intact.  Cardiovascular:     Rate and Rhythm: Normal rate and regular rhythm.     Heart sounds: Normal heart sounds.  Pulmonary:     Effort: Pulmonary effort is normal.     Comments: Decreased basilar breath sounds b/l Musculoskeletal:     Cervical back: Normal range of motion.  Skin:    General: Skin is warm.  Neurological:     General: No focal deficit present.     Mental Status: She is alert.  Psychiatric:        Mood and Affect: Mood normal.        Behavior: Behavior normal.     Assessment And Plan:  COPD with acute exacerbation (HCC) Assessment & Plan: TCM PERFORMED. A MEMBER OF THE CLINICAL TEAM SPOKE WITH THE PATIENT UPON DISCHARGE. DISCHARGE SUMMARY WAS REVIEWED IN FULL DETAIL DURING THE VISIT. MEDS RECONCILED AND COMPARED TO DISCHARGE MEDS. MEDICATION LIST WAS UPDATED AND REVIEWED WITH THE PATIENT. GREATER THAN 50% FACE TO FACE TIME WAS SPENT IN COUNSELING AND COORDINATION OF CARE. ALL QUESTIONS WERE ANSWERED TO THE SATISFACTION OF THE PATIENT. Recent hospitalization for COPD exacerbation. Improved breathing on current regimen. Confusion about specialist referral for potential upper airway issue. - Continue prednisone  taper, Singulair , albuterol , Brovana , Pulmicort . - Use oxygen at home and during sleep. - Follow up with pulmonary in one month. - Follow up on referral to speech therapist Lupita Median.     Vocal cord dysfunction Assessment & Plan: Chronic, she has a  long history of this. She has been referred to speech therapy.  - Encouraged to schedule speech therapy in the near future   Hypertensive heart disease with chronic diastolic congestive heart failure [facility]) Assessment & Plan:  Chronic, well controlled.  She will continue with bisoprolol .  - Follow low sodium diet - Follow up in four to six months   Atherosclerosis of aorta [facility]) Assessment & Plan: Chronic, currently on statin therapy. She will continue with atorvastatin  80mg  M-F and ASA 81mg  daily for now.   Dyslipidemia associated with type 2 diabetes mellitus (HCC) Assessment & Plan: Managed with Ozempic  0.5 mg. Next diabetes check in October to evaluate A1c for dosing. - Continue Ozempic  0.5 mg until October. - Evaluate A1c in October.   Muscle cramps Assessment & Plan: Painful hand cramps possibly due to dehydration or medication. Taking magnesium . - Check magnesium  level. - Encourage increased water intake.  Orders: -     CK  History of lung cancer   Return for keep same next.  Patient was given opportunity to ask questions. Patient verbalized understanding of the plan and was able to repeat key elements of the plan. All questions were answered to their satisfaction.    I, Tammy LOISE Slocumb, MD, have reviewed all documentation for this visit. The documentation on 07/18/24 for the exam, diagnosis, procedures, and orders are all accurate and complete.   IF YOU HAVE BEEN REFERRED TO A SPECIALIST, IT MAY TAKE 1-2 WEEKS TO SCHEDULE/PROCESS THE REFERRAL. IF YOU HAVE NOT HEARD FROM US /SPECIALIST IN TWO WEEKS, PLEASE GIVE US  A CALL AT [phone].   THE PATIENT IS ENCOURAGED TO PRACTICE SOCIAL DISTANCING DUE TO THE COVID-19 PANDEMIC.

## 2024-07-13 ENCOUNTER — Ambulatory Visit: Payer: Self-pay | Admitting: Internal Medicine

## 2024-07-13 LAB — CK: Total CK: 89 U/L (ref 32–182)

## 2024-07-14 ENCOUNTER — Encounter (INDEPENDENT_AMBULATORY_CARE_PROVIDER_SITE_OTHER): Payer: Self-pay

## 2024-07-15 ENCOUNTER — Encounter: Payer: Self-pay | Admitting: Internal Medicine

## 2024-07-18 NOTE — Assessment & Plan Note (Signed)
 TCM PERFORMED. A MEMBER OF THE CLINICAL TEAM SPOKE WITH THE PATIENT UPON DISCHARGE. DISCHARGE SUMMARY WAS REVIEWED IN FULL DETAIL DURING THE VISIT. MEDS RECONCILED AND COMPARED TO DISCHARGE MEDS. MEDICATION LIST WAS UPDATED AND REVIEWED WITH THE PATIENT. GREATER THAN 50% FACE TO FACE TIME WAS SPENT IN COUNSELING AND COORDINATION OF CARE. ALL QUESTIONS WERE ANSWERED TO THE SATISFACTION OF THE PATIENT. Recent hospitalization for COPD exacerbation. Improved breathing on current regimen. Confusion about specialist referral for potential upper airway issue. - Continue prednisone  taper, Singulair , albuterol , Brovana , Pulmicort . - Use oxygen at home and during sleep. - Follow up with pulmonary in one month. - Follow up on referral to speech therapist Lupita Median.

## 2024-07-18 NOTE — Assessment & Plan Note (Signed)
 Chronic, well controlled.  She will continue with bisoprolol .  - Follow low sodium diet - Follow up in four to six months

## 2024-07-18 NOTE — Assessment & Plan Note (Signed)
 Painful hand cramps possibly due to dehydration or medication. Taking magnesium . - Check magnesium  level. - Encourage increased water intake.

## 2024-07-18 NOTE — Assessment & Plan Note (Signed)
 Managed with Ozempic  0.5 mg. Next diabetes check in October to evaluate A1c for dosing. - Continue Ozempic  0.5 mg until October. - Evaluate A1c in October.

## 2024-07-18 NOTE — Assessment & Plan Note (Signed)
 Chronic, she has a long history of this. She has been referred to speech therapy.  - Encouraged to schedule speech therapy in the near future

## 2024-07-18 NOTE — Assessment & Plan Note (Signed)
Chronic, currently on statin therapy. She will continue with atorvastatin 80mg  M-F and ASA 81mg  daily for now.

## 2024-07-22 DIAGNOSIS — H40013 Open angle with borderline findings, low risk, bilateral: Secondary | ICD-10-CM | POA: Diagnosis not present

## 2024-07-22 DIAGNOSIS — H0288A Meibomian gland dysfunction right eye, upper and lower eyelids: Secondary | ICD-10-CM | POA: Diagnosis not present

## 2024-07-22 DIAGNOSIS — E119 Type 2 diabetes mellitus without complications: Secondary | ICD-10-CM | POA: Diagnosis not present

## 2024-07-22 DIAGNOSIS — H0288B Meibomian gland dysfunction left eye, upper and lower eyelids: Secondary | ICD-10-CM | POA: Diagnosis not present

## 2024-07-22 DIAGNOSIS — H2511 Age-related nuclear cataract, right eye: Secondary | ICD-10-CM | POA: Diagnosis not present

## 2024-07-22 DIAGNOSIS — H04123 Dry eye syndrome of bilateral lacrimal glands: Secondary | ICD-10-CM | POA: Diagnosis not present

## 2024-08-08 ENCOUNTER — Encounter: Payer: Self-pay | Admitting: Internal Medicine

## 2024-08-12 ENCOUNTER — Telehealth: Payer: Self-pay

## 2024-08-12 MED ORDER — ALBUTEROL SULFATE HFA 108 (90 BASE) MCG/ACT IN AERS: 2.0000 | INHALATION_SPRAY | RESPIRATORY_TRACT | 12 refills | Status: AC | PRN

## 2024-08-12 MED ORDER — BUDESONIDE 0.5 MG/2ML IN SUSP: 0.5000 mg | Freq: Two times a day (BID) | RESPIRATORY_TRACT | 12 refills | Status: AC

## 2024-08-12 MED ORDER — ARFORMOTEROL TARTRATE 15 MCG/2ML IN NEBU: 15.0000 ug | INHALATION_SOLUTION | Freq: Two times a day (BID) | RESPIRATORY_TRACT | 12 refills | Status: AC

## 2024-08-12 NOTE — Telephone Encounter (Signed)
 Dr wert please advise If you are okay with refilling these meds as we do not prescribe them.

## 2024-08-12 NOTE — Telephone Encounter (Signed)
 Copied from CRM [id number]. Topic: Clinical - Prescription Issue >> Aug 12, 2024 11:57 AM Rilla B wrote: Reason for CRM: Patient following up on questions she sent over on Monday with no response. Patient would like a call to discuss prescription issues.  Please call patient @ [phone]    ----------------------------------------------------------------------- From previous Reason for Contact - Other: Reason for CRM: Patient following up on questions she sent over on Monday with no response. Patient would like a call to discuss prescription issues.  [phone]

## 2024-08-13 ENCOUNTER — Ambulatory Visit: Admitting: Nurse Practitioner

## 2024-08-13 ENCOUNTER — Encounter: Payer: Self-pay | Admitting: Nurse Practitioner

## 2024-08-13 VITALS — BP 118/73 | HR 92 | Temp 97.8°F | Ht 65.0 in | Wt 172.0 lb

## 2024-08-13 DIAGNOSIS — J310 Chronic rhinitis: Secondary | ICD-10-CM | POA: Diagnosis not present

## 2024-08-13 DIAGNOSIS — C3491 Malignant neoplasm of unspecified part of right bronchus or lung: Secondary | ICD-10-CM | POA: Diagnosis not present

## 2024-08-13 DIAGNOSIS — R0902 Hypoxemia: Secondary | ICD-10-CM

## 2024-08-13 DIAGNOSIS — J441 Chronic obstructive pulmonary disease with (acute) exacerbation: Secondary | ICD-10-CM | POA: Diagnosis not present

## 2024-08-13 DIAGNOSIS — J383 Other diseases of vocal cords: Secondary | ICD-10-CM

## 2024-08-13 MED ORDER — METHYLPREDNISOLONE ACETATE 80 MG/ML IJ SUSP
80.0000 mg | Freq: Once | INTRAMUSCULAR | Status: AC
  Administered 2024-08-13: 80 mg via INTRAMUSCULAR

## 2024-08-13 MED ORDER — AZITHROMYCIN 250 MG PO TABS: ORAL_TABLET | ORAL | 0 refills | Status: DC

## 2024-08-13 NOTE — Progress Notes (Signed)
 @Patient  ID: [name], female    DOB: 1946-01-18, 78 y.o.   MRN: [id number]  Chief Complaint  Patient presents with   Follow-up   COPD    Breathing has gotten worse.    Referring provider: Jarold Medici, MD  HPI: 78 year old female, former smoker followed for COPD, chronic cough since highschool, VCD and chronic respiratory failure. She is a patient of Dr. Chari and last seen in office 07/07/2024. Past medical history significant for   TEST/EVENTS:  10/02/2016 PFT: FVC 50, FEV1 34, ratio 53, DLCO 23 06/28/2024 CTA chest: atherosclerosis. Negative for PE. Similar soft tissue fullness in RUL. Emphysema. Post treatment scarring in RUL. 5 mm RML nodule, unchanged. New 3 mm LUL nodule.  07/07/2024: OV with Dr. Darlean. Posthospital follow up. Not on maintenance since discharge due to confusion. Also has not started prednisone . Admitted for acute respiratory failure and discharged on O2 2 lpm hs and PRN. Yellowish cough. Breathing not back to baseline. Advised to resume maintenance therapies and complete prednisone  from the hospital. Use mucinx, tesslon PRN.   08/13/2024: Today - follow up Discussed the use of AI scribe software for clinical note transcription with the patient, who gave verbal consent to proceed.  History of Present Illness [name] is a 78 year old female with COPD who presents with worsening respiratory symptoms.  She discovered a soft, movable bump over her masseter area right before our visit. No discomfort or associated symptoms. No acute sinus symptoms.   She has a history of COPD and was hospitalized in June, after which she was discharged on oxygen. During a follow-up in July, she continued to experience breathing difficulties and completed a course of prednisone . Since July, her breathing has worsened, with chest tightness and wheezing. She's supposed to see a speech therapist but hasn't scheduled a visit yet. She then started breathing exercises which has  helped improve her symptoms over the past week.  She has been off her scheduled nebulizer treatments for six days due to prescription refill issues, which were mistakenly sent to someone named Barbarann. She has been using her albuterol  inhaler instead, which has provided some relief. Not using her duonebs. She reports a persistent cough with yellow sputum.  No fevers, chills, hemoptysis, anorexia, weight loss, CP.   She is currently not on prednisone  but has it available at home. Her oxygen levels have been consistently in the 90s without oxygen use.    Allergies  Allergen Reactions   Other Swelling    HAIR DYE    Immunization History  Administered Date(s) Administered   Fluad Quad(high Dose 65+) 09/06/2020, 09/12/2021, 10/03/2022   Influenza Split 09/29/2013, 09/30/2015   Influenza Whole 08/30/2016   Influenza, High Dose Seasonal PF 09/29/2017, 09/14/2018, 09/01/2019   Influenza,inj,Quad PF,6+ Mos 09/12/2014   Influenza-Unspecified 09/29/2013, 09/12/2014, 09/30/2015, 09/29/2017, 09/14/2018, 09/12/2023   PFIZER(Purple Top)SARS-COV-2 Vaccination 01/19/2020, 02/09/2020, 12/21/2020, 05/16/2022, 09/12/2023   Pneumococcal Conjugate-13 01/06/2020   Pneumococcal Polysaccharide-23 10/07/2013   Pneumococcal-Unspecified 10/05/2013   Tdap 06/07/2019, 06/09/2019   Zoster Recombinant(Shingrix ) 07/26/2020, 10/09/2020    Past Medical History:  Diagnosis Date   Allergy  09/30/1996   Arthritis    Asthma    Complication of anesthesia    COPD (chronic obstructive pulmonary disease) (HCC)    Diabetes (HCC)    High cholesterol    History of radiation therapy    Right Lung- 12/04/21-12/11/21- Dr. Lynwood Nasuti   Hypertension    Lung cancer [facility])    Neuromuscular disorder (  HCC)    Pneumonia 09/10/2017   PONV (postoperative nausea and vomiting)    Stroke (HCC) 2007   mini strokes    Tobacco History: Social History   Tobacco Use  Smoking Status Former   Current packs/day: 0.00   Average  packs/day: 0.5 packs/day for 42.0 years (21.0 ttl pk-yrs)   Types: Cigarettes   Start date: 01/30/1967   Quit date: 01/30/2006   Years since quitting: 18.5  Smokeless Tobacco Never  Tobacco Comments   intermittent cycles b/w 1/4-1/2ppd   Counseling given: Not Answered Tobacco comments: intermittent cycles b/w 1/4-1/2ppd   Outpatient Medications Prior to Visit  Medication Sig Dispense Refill   albuterol  (VENTOLIN  HFA) 108 (90 Base) MCG/ACT inhaler Inhale 2 puffs into the lungs every 4 (four) hours as needed for wheezing or shortness of breath. 18 g 12   arformoterol  (BROVANA ) 15 MCG/2ML NEBU Take 2 mLs (15 mcg total) by nebulization 2 (two) times daily. 120 mL 12   aspirin  81 MG tablet Take 81 mg by mouth daily.     Biotin 89999 MCG TBDP Take 10,000 mcg by mouth daily.     budesonide  (PULMICORT ) 0.5 MG/2ML nebulizer solution Take 2 mLs (0.5 mg total) by nebulization 2 (two) times daily. 120 mL 12   CALCIUM -MAGNESIUM -ZINC PO Take 1 capsule by mouth at bedtime.     Cholecalciferol (VITAMIN D3) 2000 UNITS TABS Take 2,000 Units by mouth at bedtime.     famotidine  (PEPCID ) 20 MG tablet Take 1 tablet (20 mg total) by mouth daily after supper. 90 tablet 3   HYDROcodone  bit-homatropine (HYCODAN) 5-1.5 MG/5ML syrup Take 5 mLs by mouth every 4 (four) hours as needed for cough. 120 mL 0   ipratropium-albuterol  (DUONEB) 0.5-2.5 (3) MG/3ML SOLN Take 3 mLs by nebulization every 4 (four) hours as needed. 360 mL 0   Magnesium  250 MG TABS Take 1 tablet by mouth daily.     methocarbamol  (ROBAXIN ) 500 MG tablet Take 1 tablet (500 mg total) by mouth every 8 (eight) hours as needed for muscle spasms. 30 tablet 0   montelukast  (SINGULAIR ) 10 MG tablet TAKE 1 TABLET(10 MG) BY MOUTH AT BEDTIME 90 tablet 3   pantoprazole  (PROTONIX ) 40 MG tablet TAKE 1 TABLET BY MOUTH 30 MINUTES BEFORE FIRST MEAL OF THE DAY 90 tablet 2   predniSONE  (DELTASONE ) 10 MG tablet 2 each am until better then 1 each am x 5 days and stop 100  tablet 2   pregabalin  (LYRICA ) 100 MG capsule Take 1 capsule (100 mg total) by mouth 3 (three) times daily. 90 capsule 3   promethazine  (PHENERGAN ) 25 MG tablet Take 25 mg by mouth every 6 (six) hours as needed for nausea or vomiting.     Semaglutide , 1 MG/DOSE, (OZEMPIC , 1 MG/DOSE,) 4 MG/3ML SOPN Inject 1 mg into the skin once a week. Friday 3 mL 3   atorvastatin  (LIPITOR) 80 MG tablet TAKE 1 TABLET BY MOUTH DAILY ON MONDAY-FRIDAY. (Patient not taking: Reported on 08/13/2024) 90 tablet 1   benzonatate  (TESSALON ) 200 MG capsule Take 1 capsule (200 mg total) by mouth 3 (three) times daily. (Patient not taking: Reported on 08/13/2024) 20 capsule 0   bisoprolol  (ZEBETA ) 5 MG tablet TAKE 1/2 TABLET BY MOUTH EVERY DAY 45 tablet 3   DULoxetine  (CYMBALTA ) 60 MG capsule TAKE 1 CAPSULE(60 MG) BY MOUTH DAILY (Patient not taking: Reported on 08/13/2024) 90 capsule 1   Evolocumab  (REPATHA  SURECLICK) 140 MG/ML SOAJ Inject 140 mg into the skin every 14 (fourteen)  days. 6 mL 3   No facility-administered medications prior to visit.     Review of Systems: As above    Physical Exam:  BP 118/73 (BP Location: Left Arm, Patient Position: Sitting, Cuff Size: Normal)   Pulse 92   Temp 97.8 F (36.6 C) (Oral)   Ht 5' 5 (1.651 m)   Wt 172 lb (78 kg)   SpO2 93%   BMI 28.62 kg/m   GEN: Pleasant, interactive, well-appearing; in no acute distress HEENT:  Normocephalic and atraumatic. EACs patent bilaterally. TM pearly gray with present light reflex bilaterally. PERRLA. Sclera white. Nasal turbinates erythematous, moist and patent bilaterally. Clear rhinorrhea present. Oropharynx pink and moist, without exudate or edema. No lesions, ulcerations, or postnasal drip.  NECK:  Supple w/ fair ROM. No JVD present. Normal carotid impulses w/o bruits. Thyroid  symmetrical with no goiter or nodules palpated. Mild, non-tender cervical and preauricular lymphadenopathy.   CV: RRR, no m/r/g, no peripheral edema. Pulses intact, +2  bilaterally. No cyanosis, pallor or clubbing. PULMONARY:  Unlabored, regular breathing. Scattered wheezes bilaterally A&P. No accessory muscle use.  GI: BS present and normoactive. Soft, non-tender to palpation. No organomegaly or masses detected.  MSK: No erythema, warmth or tenderness. Cap refil <2 sec all extrem.  Neuro: A/Ox3. No focal deficits noted.   Skin: Warm, no lesions or rashe Psych: Normal affect and behavior. Judgement and thought content appropriate.     Lab Results:  CBC    Component Value Date/Time   WBC 11.0 (H) 07/03/2024 0535   RBC 4.25 07/03/2024 0535   HGB 12.2 07/03/2024 0535   HGB 14.0 02/18/2024 1205   HCT 38.1 07/03/2024 0535   HCT 42.4 02/18/2024 1205   PLT 406 (H) 07/03/2024 0535   PLT 341 02/18/2024 1205   MCV 89.6 07/03/2024 0535   MCV 91 02/18/2024 1205   MCH 28.7 07/03/2024 0535   MCHC 32.0 07/03/2024 0535   RDW 14.4 07/03/2024 0535   RDW 13.9 02/18/2024 1205   LYMPHSABS 0.7 07/03/2024 0535   MONOABS 0.4 07/03/2024 0535   EOSABS 0.0 07/03/2024 0535   BASOSABS 0.0 07/03/2024 0535    BMET    Component Value Date/Time   NA 139 07/03/2024 0535   NA 139 05/31/2024 1240   K 4.3 07/03/2024 0535   CL 98 07/03/2024 0535   CO2 32 07/03/2024 0535   GLUCOSE 189 (H) 07/03/2024 0535   BUN 16 07/03/2024 0535   BUN 11 05/31/2024 1240   CREATININE 0.60 07/03/2024 0535   CALCIUM  9.0 07/03/2024 0535   GFRNONAA >60 07/03/2024 0535   GFRAA 81 01/04/2021 1450    BNP No results found for: BNP   Imaging:  No results found.  methylPREDNISolone  acetate (DEPO-MEDROL ) injection 80 mg     Date Action Dose Route User   08/13/2024 1229 Given 80 mg Intramuscular (Left Ventrogluteal) Moody Rancher, CMA          Latest Ref Rng & Units 10/02/2016   10:21 AM 03/17/2014    4:00 PM  PFT Results  FVC-Pre L 1.32  1.88   FVC-Predicted Pre % 50  69   FVC-Post L  1.93   FVC-Predicted Post %  71   Pre FEV1/FVC % % 53  66   Post FEV1/FCV % %  63    FEV1-Pre L 0.70  1.23   FEV1-Predicted Pre % 34  58   FEV1-Post L  1.23   DLCO uncorrected ml/min/mmHg 6.18  15.24   DLCO UNC% %  22  56   DLCO corrected ml/min/mmHg 6.22    DLCO COR %Predicted % 23    DLVA Predicted % 50  77   TLC L  4.57   TLC % Predicted %  85   RV % Predicted %  119     Lab Results  Component Value Date   NITRICOXIDE 14 04/14/2017        Assessment & Plan:   COPD with acute exacerbation (HCC) Severe COPD with acute exacerbation, likely due to lack of maintenance bronchodilators but possible viral component given sinus symptoms. Will treat her with azithromyin and prednisone  taper. She has prednisone  at home per her report so instructed on proper dosing and to call with any confusion/questions. Verified her maintenance nebulizers have been sent to her pharmacy. She will resume these today. Action plan in place. ED precautions. Close follow up.  Patient Instructions  Continue Albuterol  inhaler 2 puffs or duoneb 3 mL neb every 6 hours as needed for shortness of breath or wheezing. Notify if symptoms persist despite rescue inhaler/neb use. Use the duonebs 2-3 times a day until symptoms resolve then return to using as needed  Restart arformoterol  nebs Twice daily  Restart budesonide  nebs Twice daily. Brush tongue and rinse mouth afterwards Continue montelukast  1 tab daily Continue pantoprazole  1 tab daily   Prednisone  taper. 40 mg for 2 days, then 30 mg for 2 days, 20 mg for 2 days, then 10 mg for 2 days, then stop. Take in AM with food. Start tomorrow Steroid shot today Z pack - take 2 tablets of azithromycin  on day one then 1 tablet daily for four additional days   Follow up 9/11 at 1:30 PM with Katie Rylyn Zawistowski,NP to make sure you are feeling better. If symptoms do not improve or worsen, please contact office for sooner follow up or seek emergency care.    Rhinitis, nonallergic With cervical and preauricular LAD. Advised to restart flonase  and continue  antihistamine. Follow up with PCP if LAD does not improve or worsens.   Adenocarcinoma, lung, right (HCC) Stable on recent imaging. She does have a new 3 mm nodule in the LUL that will need follow up in December 2025 for 6 month follow up. Follow up with oncology as scheduled  Vocal cord dysfunction Advised she reach out to SLP to schedule her visit based on past recommendations. She has seen two ENT specialists  Hypoxia Stable on room air. Will rewalk her at follow up. Advised to monitor for goal >88-90% and utilize 2 lpm PRN and at night    Advised if symptoms do not improve or worsen, to please contact office for sooner follow up or seek emergency care.   I spent 45 minutes of dedicated to the care of this patient on the date of this encounter to include pre-visit review of records, face-to-face time with the patient discussing conditions above, post visit ordering of testing, clinical documentation with the electronic health record, making appropriate referrals as documented, and communicating necessary findings to members of the patients care team.  Comer LULLA Rouleau, NP 08/13/2024  Pt aware and understands NP's role.

## 2024-08-13 NOTE — Assessment & Plan Note (Signed)
 Stable on room air. Will rewalk her at follow up. Advised to monitor for goal >88-90% and utilize 2 lpm PRN and at night

## 2024-08-13 NOTE — Patient Instructions (Addendum)
 Continue Albuterol  inhaler 2 puffs or duoneb 3 mL neb every 6 hours as needed for shortness of breath or wheezing. Notify if symptoms persist despite rescue inhaler/neb use. Use the duonebs 2-3 times a day until symptoms resolve then return to using as needed  Restart arformoterol  nebs Twice daily  Restart budesonide  nebs Twice daily. Brush tongue and rinse mouth afterwards Continue montelukast  1 tab daily Continue pantoprazole  1 tab daily   Prednisone  taper. 40 mg for 2 days, then 30 mg for 2 days, 20 mg for 2 days, then 10 mg for 2 days, then stop. Take in AM with food. Start tomorrow Steroid shot today Z pack - take 2 tablets of azithromycin  on day one then 1 tablet daily for four additional days   Follow up 9/11 at 1:30 PM with Tammy Trevontae Lindahl,NP to make sure you are feeling better. If symptoms do not improve or worsen, please contact office for sooner follow up or seek emergency care.

## 2024-08-13 NOTE — Assessment & Plan Note (Signed)
 Severe COPD with acute exacerbation, likely due to lack of maintenance bronchodilators but possible viral component given sinus symptoms. Will treat her with azithromyin and prednisone  taper. She has prednisone  at home per her report so instructed on proper dosing and to call with any confusion/questions. Verified her maintenance nebulizers have been sent to her pharmacy. She will resume these today. Action plan in place. ED precautions. Close follow up.  Patient Instructions  Continue Albuterol  inhaler 2 puffs or duoneb 3 mL neb every 6 hours as needed for shortness of breath or wheezing. Notify if symptoms persist despite rescue inhaler/neb use. Use the duonebs 2-3 times a day until symptoms resolve then return to using as needed  Restart arformoterol  nebs Twice daily  Restart budesonide  nebs Twice daily. Brush tongue and rinse mouth afterwards Continue montelukast  1 tab daily Continue pantoprazole  1 tab daily   Prednisone  taper. 40 mg for 2 days, then 30 mg for 2 days, 20 mg for 2 days, then 10 mg for 2 days, then stop. Take in AM with food. Start tomorrow Steroid shot today Z pack - take 2 tablets of azithromycin  on day one then 1 tablet daily for four additional days   Follow up 9/11 at 1:30 PM with Katie Hamsa Laurich,NP to make sure you are feeling better. If symptoms do not improve or worsen, please contact office for sooner follow up or seek emergency care.

## 2024-08-13 NOTE — Assessment & Plan Note (Signed)
 Advised she reach out to SLP to schedule her visit based on past recommendations. She has seen two ENT specialists

## 2024-08-13 NOTE — Assessment & Plan Note (Signed)
 With cervical and preauricular LAD. Advised to restart flonase  and continue antihistamine. Follow up with PCP if LAD does not improve or worsens.

## 2024-08-13 NOTE — Assessment & Plan Note (Signed)
 Stable on recent imaging. She does have a new 3 mm nodule in the LUL that will need follow up in December 2025 for 6 month follow up. Follow up with oncology as scheduled

## 2024-08-19 ENCOUNTER — Encounter: Payer: Self-pay | Admitting: Internal Medicine

## 2024-08-20 ENCOUNTER — Encounter: Payer: Self-pay | Admitting: Internal Medicine

## 2024-08-20 DIAGNOSIS — J383 Other diseases of vocal cords: Secondary | ICD-10-CM

## 2024-08-24 NOTE — Telephone Encounter (Signed)
 She had some enlarged lymph nodes, which are likely related to sinus congestion. She can follow up with her PCP regarding this.  We will walk her at her next OV and decide if we can d/c her oxygen. Thanks.

## 2024-08-30 NOTE — Telephone Encounter (Signed)
 I see in your LOV you were going to walk her at follow-up to see about DC oxygen, would you like to wait until then or go ahead and DC now per pt request?

## 2024-08-31 NOTE — Telephone Encounter (Signed)
 Have to wait to walk her in office. Thanks.

## 2024-09-02 ENCOUNTER — Other Ambulatory Visit: Payer: Self-pay | Admitting: Internal Medicine

## 2024-09-06 NOTE — Telephone Encounter (Signed)
 Please advise on where pt should be referred to see Dr Jacelyn pt states she was not supposed to see ENT as referred from LOV and when I google the address pt provided in previous My Chart is is an outpatient [facility] rehab facility.

## 2024-09-08 ENCOUNTER — Other Ambulatory Visit: Payer: Self-pay | Admitting: Internal Medicine

## 2024-09-09 ENCOUNTER — Encounter: Payer: Self-pay | Admitting: Nurse Practitioner

## 2024-09-09 ENCOUNTER — Ambulatory Visit: Admitting: Nurse Practitioner

## 2024-09-09 VITALS — BP 132/74 | HR 88 | Temp 98.5°F | Ht 65.0 in | Wt 179.6 lb

## 2024-09-09 DIAGNOSIS — J449 Chronic obstructive pulmonary disease, unspecified: Secondary | ICD-10-CM

## 2024-09-09 DIAGNOSIS — J9601 Acute respiratory failure with hypoxia: Secondary | ICD-10-CM

## 2024-09-09 DIAGNOSIS — J310 Chronic rhinitis: Secondary | ICD-10-CM

## 2024-09-09 DIAGNOSIS — Z87891 Personal history of nicotine dependence: Secondary | ICD-10-CM | POA: Diagnosis not present

## 2024-09-09 DIAGNOSIS — R590 Localized enlarged lymph nodes: Secondary | ICD-10-CM

## 2024-09-09 DIAGNOSIS — C3491 Malignant neoplasm of unspecified part of right bronchus or lung: Secondary | ICD-10-CM | POA: Diagnosis not present

## 2024-09-09 NOTE — Assessment & Plan Note (Signed)
 Palpable lump on left jawline at mandibular joint. Possible lymph node. No infectious symptoms. Advised to follow up with PCP for further evaluation.

## 2024-09-09 NOTE — Patient Instructions (Addendum)
 Continue Albuterol  inhaler 2 puffs or duoneb 3 mL neb every 6 hours as needed for shortness of breath or wheezing. Notify if symptoms persist despite rescue inhaler/neb use. Use duonebs 4 times a day  Continue arformoterol  nebs Twice daily  Continue budesonide  nebs Twice daily. Brush tongue and rinse mouth afterwards Continue montelukast  1 tab daily Continue pantoprazole  1 tab daily   Talk to your primary care doctor about the lump on your jaw and to have further evaluation.   Ok to discontinue oxygen - order placed today   Follow up in 6 months with [name] or [name]. If symptoms do not improve or worsen, please contact office for sooner follow up or seek emergency care.

## 2024-09-09 NOTE — Assessment & Plan Note (Signed)
 Severe COPD with very severe diffusion defect. Recent exacerbation secondary to discontinuation of maintenance therapies. Clinically improved and back to baseline. Aware of importance of compliance with maintenance therapies. Trigger prevention reviewed. Action plan in place.  Patient Instructions  Continue Albuterol  inhaler 2 puffs or duoneb 3 mL neb every 6 hours as needed for shortness of breath or wheezing. Notify if symptoms persist despite rescue inhaler/neb use. Use duonebs 4 times a day  Continue arformoterol  nebs Twice daily  Continue budesonide  nebs Twice daily. Brush tongue and rinse mouth afterwards Continue montelukast  1 tab daily Continue pantoprazole  1 tab daily   Talk to your primary care doctor about the lump on your jaw and to have further evaluation.   Ok to discontinue oxygen - order placed today   Follow up in 6 months with [name] or [name]. If symptoms do not improve or worsen, please contact office for sooner follow up or seek emergency care.

## 2024-09-09 NOTE — Assessment & Plan Note (Signed)
 Discharged with home O2 June 2025. Exertional hypoxia resolved and pt request to d/c O2. Order placed today.

## 2024-09-09 NOTE — Assessment & Plan Note (Signed)
 Stable on recent imaging. She does have a new 3 mm nodule in the LUL that will need follow up in December 2025 for 6 month follow up. Follow up with oncology as scheduled

## 2024-09-09 NOTE — Progress Notes (Signed)
 @Patient  ID: [name], female    DOB: Jan 08, 1946, 78 y.o.   MRN: [id number]  Chief Complaint  Patient presents with   Medical Management of Chronic Issues    Copd w/ acute exacerbation    Referring provider: Jarold Medici, MD  HPI: 78 year old female, former smoker followed for COPD, chronic cough since highschool, VCD and chronic respiratory failure. She is a patient of Dr. Chari and last seen in office 08/14/2024 by [facility] NP. Past medical history significant for   TEST/EVENTS:  10/02/2016 PFT: FVC 50, FEV1 34, ratio 53, DLCO 23 06/28/2024 CTA chest: atherosclerosis. Negative for PE. Similar soft tissue fullness in RUL. Emphysema. Post treatment scarring in RUL. 5 mm RML nodule, unchanged. New 3 mm LUL nodule.  07/07/2024: OV with Dr. Darlean. Posthospital follow up. Not on maintenance since discharge due to confusion. Also has not started prednisone . Admitted for acute respiratory failure and discharged on O2 2 lpm hs and PRN. Yellowish cough. Breathing not back to baseline. Advised to resume maintenance therapies and complete prednisone  from the hospital. Use mucinx, tesslon PRN.   08/13/2024: OV with Kynley Metzger NP KAELIE HENIGAN is a 78 year old female with COPD who presents with worsening respiratory symptoms. She discovered a soft, movable bump over her masseter area right before our visit. No discomfort or associated symptoms. No acute sinus symptoms.  She has a history of COPD and was hospitalized in June, after which she was discharged on oxygen. During a follow-up in July, she continued to experience breathing difficulties and completed a course of prednisone . Since July, her breathing has worsened, with chest tightness and wheezing. She's supposed to see a speech therapist but hasn't scheduled a visit yet. She then started breathing exercises which has helped improve her symptoms over the past week She has been off her scheduled nebulizer treatments for six days due to prescription  refill issues, which were mistakenly sent to someone named Barbarann. She has been using her albuterol  inhaler instead, which has provided some relief. Not using her duonebs. She reports a persistent cough with yellow sputum.  No fevers, chills, hemoptysis, anorexia, weight loss, CP.  She is currently not on prednisone  but has it available at home. Her oxygen levels have been consistently in the 90s without oxygen use.  09/09/2024: Today - follow up Discussed the use of AI scribe software for clinical note transcription with the patient, who gave verbal consent to proceed.  History of Present Illness [name] is a 78 year old female who presents for follow-up of respiratory symptoms.  She has experienced an overall improvement in her respiratory symptoms, particularly wheezing, after resuming her breathing treatments. She had a flare-up of symptoms after discontinuing these treatments for about two weeks. She continues to experience some wheezing, which has been a long-standing issue for years, and a mild cough producing whitish to grayish sputum. No yellow or green discoloration of the sputum is noted, and no gross or discolored sputum is present. Breathing feels better. No issues with hemoptysis, weight loss, anorexia, chest tightness.  She has not been using her oxygen. No low oxygen levels at home.   She mentions a persistent lump on her jawline, visible from the front. She is uncertain about its nature. No associated pain, redness, warmth.     Allergies  Allergen Reactions   Other Swelling    HAIR DYE    Immunization History  Administered Date(s) Administered   Fluad Quad(high Dose 65+) 09/06/2020, 09/12/2021,  10/03/2022   INFLUENZA, HIGH DOSE SEASONAL PF 09/29/2017, 09/14/2018, 09/01/2019   Influenza Split 09/29/2013, 09/30/2015   Influenza Whole 08/30/2016   Influenza,inj,Quad PF,6+ Mos 09/12/2014   Influenza-Unspecified 09/29/2013, 09/12/2014, 09/30/2015, 09/29/2017,  09/14/2018, 09/12/2023   PFIZER(Purple Top)SARS-COV-2 Vaccination 01/19/2020, 02/09/2020, 12/21/2020, 05/16/2022, 09/12/2023   Pneumococcal Conjugate-13 01/06/2020   Pneumococcal Polysaccharide-23 10/07/2013   Pneumococcal-Unspecified 10/05/2013   Tdap 06/07/2019, 06/09/2019   Zoster Recombinant(Shingrix ) 07/26/2020, 10/09/2020    Past Medical History:  Diagnosis Date   Allergy  09/30/1996   Arthritis    Asthma    Complication of anesthesia    COPD (chronic obstructive pulmonary disease) (HCC)    Diabetes (HCC)    High cholesterol    History of radiation therapy    Right Lung- 12/04/21-12/11/21- Dr. Lynwood Nasuti   Hypertension    Lung cancer [facility])    Neuromuscular disorder (HCC)    Pneumonia 09/10/2017   PONV (postoperative nausea and vomiting)    Stroke (HCC) 2007   mini strokes    Tobacco History: Social History   Tobacco Use  Smoking Status Former   Current packs/day: 0.00   Average packs/day: 0.5 packs/day for 42.0 years (21.0 ttl pk-yrs)   Types: Cigarettes   Start date: 01/30/1967   Quit date: 01/30/2006   Years since quitting: 18.6  Smokeless Tobacco Never  Tobacco Comments   intermittent cycles b/w 1/4-1/2ppd   Counseling given: Not Answered Tobacco comments: intermittent cycles b/w 1/4-1/2ppd   Outpatient Medications Prior to Visit  Medication Sig Dispense Refill   albuterol  (VENTOLIN  HFA) 108 (90 Base) MCG/ACT inhaler Inhale 2 puffs into the lungs every 4 (four) hours as needed for wheezing or shortness of breath. 18 g 12   arformoterol  (BROVANA ) 15 MCG/2ML NEBU Take 2 mLs (15 mcg total) by nebulization 2 (two) times daily. 120 mL 12   aspirin  81 MG tablet Take 81 mg by mouth daily.     Biotin 89999 MCG TBDP Take 10,000 mcg by mouth daily.     bisoprolol  (ZEBETA ) 5 MG tablet TAKE 1/2 TABLET BY MOUTH EVERY DAY 45 tablet 3   budesonide  (PULMICORT ) 0.5 MG/2ML nebulizer solution Take 2 mLs (0.5 mg total) by nebulization 2 (two) times daily. 120 mL 12    CALCIUM -MAGNESIUM -ZINC PO Take 1 capsule by mouth at bedtime.     Cholecalciferol (VITAMIN D3) 2000 UNITS TABS Take 2,000 Units by mouth at bedtime.     DULoxetine  (CYMBALTA ) 60 MG capsule TAKE 1 CAPSULE(60 MG) BY MOUTH DAILY 90 capsule 1   famotidine  (PEPCID ) 20 MG tablet Take 1 tablet (20 mg total) by mouth daily after supper. 90 tablet 3   HYDROcodone  bit-homatropine (HYCODAN) 5-1.5 MG/5ML syrup Take 5 mLs by mouth every 4 (four) hours as needed for cough. 120 mL 0   ipratropium-albuterol  (DUONEB) 0.5-2.5 (3) MG/3ML SOLN Take 3 mLs by nebulization every 4 (four) hours as needed. 360 mL 0   Magnesium  250 MG TABS Take 1 tablet by mouth daily.     methocarbamol  (ROBAXIN ) 500 MG tablet Take 1 tablet (500 mg total) by mouth every 8 (eight) hours as needed for muscle spasms. 30 tablet 0   montelukast  (SINGULAIR ) 10 MG tablet TAKE 1 TABLET(10 MG) BY MOUTH AT BEDTIME 90 tablet 3   pantoprazole  (PROTONIX ) 40 MG tablet TAKE 1 TABLET BY MOUTH 30 MINUTES BEFORE FIRST MEAL OF THE DAY 90 tablet 2   predniSONE  (DELTASONE ) 10 MG tablet 2 each am until better then 1 each am x 5 days and stop 100  tablet 2   pregabalin  (LYRICA ) 100 MG capsule Take 1 capsule (100 mg total) by mouth 3 (three) times daily. 90 capsule 3   promethazine  (PHENERGAN ) 25 MG tablet Take 25 mg by mouth every 6 (six) hours as needed for nausea or vomiting.     REPATHA  SURECLICK 140 MG/ML SOAJ ADMINISTER 1 ML UNDER THE SKIN EVERY 14 DAYS 6 mL 3   Semaglutide , 1 MG/DOSE, (OZEMPIC , 1 MG/DOSE,) 4 MG/3ML SOPN Inject 1 mg into the skin once a week. Friday 3 mL 3   atorvastatin  (LIPITOR) 80 MG tablet TAKE 1 TABLET BY MOUTH DAILY ON MONDAY-FRIDAY. (Patient not taking: Reported on 09/09/2024) 90 tablet 1   benzonatate  (TESSALON ) 200 MG capsule Take 1 capsule (200 mg total) by mouth 3 (three) times daily. (Patient not taking: Reported on 09/09/2024) 20 capsule 0   azithromycin  (ZITHROMAX ) 250 MG tablet Take 2 tablets on day one then take 1 tablet daily  for four additional days 6 tablet 0   No facility-administered medications prior to visit.     Review of Systems: As above    Physical Exam:  BP 132/74   Pulse 88   Temp 98.5 F (36.9 C) (Oral)   Ht 5' 5 (1.651 m)   Wt 179 lb 9.6 oz (81.5 kg)   SpO2 96%   BMI 29.89 kg/m   GEN: Pleasant, interactive, well-appearing; in no acute distress HEENT:  Normocephalic and atraumatic. EACs patent bilaterally. TM pearly gray with present light reflex bilaterally. PERRLA. Sclera white. Nasal turbinates pink, moist and patent bilaterally. No rhinorrhea present. Oropharynx pink and moist, without exudate or edema. No lesions, ulcerations, or postnasal drip.  NECK:  Supple w/ fair ROM. No JVD present. Normal carotid impulses w/o bruits. Thyroid  symmetrical with no goiter or nodules palpated. Mild, non-tender preauricular node CV: RRR, no m/r/g, no peripheral edema. Pulses intact, +2 bilaterally. No cyanosis, pallor or clubbing. PULMONARY:  Unlabored, regular breathing. Minimal scattered wheezes bilaterally A&P. No accessory muscle use.  GI: BS present and normoactive. Soft, non-tender to palpation. No organomegaly or masses detected.  MSK: No erythema, warmth or tenderness. Cap refil <2 sec all extrem.  Neuro: A/Ox3. No focal deficits noted.   Skin: Warm, no lesions or rashe Psych: Normal affect and behavior. Judgement and thought content appropriate.     Lab Results:  CBC    Component Value Date/Time   WBC 11.0 (H) 07/03/2024 0535   RBC 4.25 07/03/2024 0535   HGB 12.2 07/03/2024 0535   HGB 14.0 02/18/2024 1205   HCT 38.1 07/03/2024 0535   HCT 42.4 02/18/2024 1205   PLT 406 (H) 07/03/2024 0535   PLT 341 02/18/2024 1205   MCV 89.6 07/03/2024 0535   MCV 91 02/18/2024 1205   MCH 28.7 07/03/2024 0535   MCHC 32.0 07/03/2024 0535   RDW 14.4 07/03/2024 0535   RDW 13.9 02/18/2024 1205   LYMPHSABS 0.7 07/03/2024 0535   MONOABS 0.4 07/03/2024 0535   EOSABS 0.0 07/03/2024 0535    BASOSABS 0.0 07/03/2024 0535    BMET    Component Value Date/Time   NA 139 07/03/2024 0535   NA 139 05/31/2024 1240   K 4.3 07/03/2024 0535   CL 98 07/03/2024 0535   CO2 32 07/03/2024 0535   GLUCOSE 189 (H) 07/03/2024 0535   BUN 16 07/03/2024 0535   BUN 11 05/31/2024 1240   CREATININE 0.60 07/03/2024 0535   CALCIUM  9.0 07/03/2024 0535   GFRNONAA >60 07/03/2024 0535   GFRAA 81  01/04/2021 1450    BNP No results found for: BNP   Imaging:  No results found.  methylPREDNISolone  acetate (DEPO-MEDROL ) injection 80 mg     Date Action Dose Route User   08/13/2024 1229 Given 80 mg Intramuscular (Left Ventrogluteal) Moody Rancher, CMA          Latest Ref Rng & Units 10/02/2016   10:21 AM 03/17/2014    4:00 PM  PFT Results  FVC-Pre L 1.32  1.88   FVC-Predicted Pre % 50  69   FVC-Post L  1.93   FVC-Predicted Post %  71   Pre FEV1/FVC % % 53  66   Post FEV1/FCV % %  63   FEV1-Pre L 0.70  1.23   FEV1-Predicted Pre % 34  58   FEV1-Post L  1.23   DLCO uncorrected ml/min/mmHg 6.18  15.24   DLCO UNC% % 22  56   DLCO corrected ml/min/mmHg 6.22    DLCO COR %Predicted % 23    DLVA Predicted % 50  77   TLC L  4.57   TLC % Predicted %  85   RV % Predicted %  119     Lab Results  Component Value Date   NITRICOXIDE 14 04/14/2017        Assessment & Plan:   COPD GOLD II  Severe COPD with very severe diffusion defect. Recent exacerbation secondary to discontinuation of maintenance therapies. Clinically improved and back to baseline. Aware of importance of compliance with maintenance therapies. Trigger prevention reviewed. Action plan in place.  Patient Instructions  Continue Albuterol  inhaler 2 puffs or duoneb 3 mL neb every 6 hours as needed for shortness of breath or wheezing. Notify if symptoms persist despite rescue inhaler/neb use. Use duonebs 4 times a day  Continue arformoterol  nebs Twice daily  Continue budesonide  nebs Twice daily. Brush tongue and rinse mouth  afterwards Continue montelukast  1 tab daily Continue pantoprazole  1 tab daily   Talk to your primary care doctor about the lump on your jaw and to have further evaluation.   Ok to discontinue oxygen - order placed today   Follow up in 6 months with Dr. Darlean or Izetta Malachy PIETY. If symptoms do not improve or worsen, please contact office for sooner follow up or seek emergency care.    Adenocarcinoma, lung, right (HCC) Stable on recent imaging. She does have a new 3 mm nodule in the LUL that will need follow up in December 2025 for 6 month follow up. Follow up with oncology as scheduled  Acute hypoxic respiratory failure [facility]) Discharged with home O2 June 2025. Exertional hypoxia resolved and pt request to d/c O2. Order placed today.   Preauricular lymphadenopathy Palpable lump on left jawline at mandibular joint. Possible lymph node. No infectious symptoms. Advised to follow up with PCP for further evaluation.     Advised if symptoms do not improve or worsen, to please contact office for sooner follow up or seek emergency care.   I spent 35 minutes of dedicated to the care of this patient on the date of this encounter to include pre-visit review of records, face-to-face time with the patient discussing conditions above, post visit ordering of testing, clinical documentation with the electronic health record, making appropriate referrals as documented, and communicating necessary findings to members of the patients care team.  Comer LULLA Malachy, NP 09/09/2024  Pt aware and understands NP's role.

## 2024-09-13 ENCOUNTER — Ambulatory Visit (INDEPENDENT_AMBULATORY_CARE_PROVIDER_SITE_OTHER): Payer: Self-pay | Admitting: Nurse Practitioner

## 2024-09-13 VITALS — BP 128/60 | HR 91 | Temp 98.4°F | Ht 65.0 in | Wt 183.0 lb

## 2024-09-13 DIAGNOSIS — M278 Other specified diseases of jaws: Secondary | ICD-10-CM | POA: Diagnosis not present

## 2024-09-13 DIAGNOSIS — E6609 Other obesity due to excess calories: Secondary | ICD-10-CM

## 2024-09-13 DIAGNOSIS — M542 Cervicalgia: Secondary | ICD-10-CM | POA: Diagnosis not present

## 2024-09-13 DIAGNOSIS — Z23 Encounter for immunization: Secondary | ICD-10-CM

## 2024-09-13 DIAGNOSIS — Z6830 Body mass index (BMI) 30.0-30.9, adult: Secondary | ICD-10-CM | POA: Diagnosis not present

## 2024-09-13 DIAGNOSIS — E66811 Obesity, class 1: Secondary | ICD-10-CM

## 2024-09-13 NOTE — Progress Notes (Unsigned)
 LILLETTE Kristeen JINNY Gladis, CMA,acting as a Neurosurgeon for Gaines Ada, FNP.,have documented all relevant documentation on the behalf of Gaines Ada, FNP,as directed by  Gaines Ada, FNP while in the presence of Gaines Ada, FNP.  Subjective:  Patient ID: [name] , female    DOB: Dec 25, 1946 , 77 y.o.   MRN: [id number]  Chief Complaint  Patient presents with   Mass    Patient presents today for a lump on the left side of her jaw, patient first noticed the lump in August. Patient denies any pain and any increased size.    Neck Pain    Patient reports she has neck pain that shortly started after noticing the lump.     HPI  She was seen by the pulmponologist who gave her an antibiotic but did not improve and was advised to be seen by PCP. Denies fever or pain. She is having neck pain off and on. Unable to determine when it started. It is in the line of her jaw. No medications, intermittent. Feels like an ache, when she moves is worse. When she repositions her neck it is worse.      Past Medical History:  Diagnosis Date   Allergy  09/30/1996   Arthritis    Asthma    Complication of anesthesia    COPD (chronic obstructive pulmonary disease) (HCC)    Diabetes (HCC)    High cholesterol    History of radiation therapy    Right Lung- 12/04/21-12/11/21- Dr. Lynwood Nasuti   Hypertension    Lung cancer [facility])    Neuromuscular disorder (HCC)    Pneumonia 09/10/2017   PONV (postoperative nausea and vomiting)    Stroke (HCC) 2007   mini strokes     Family History  Problem Relation Age of Onset   Heart murmur Mother    Heart attack Father    Diabetes Father    Diabetes Brother    Stroke Brother    Vision loss Brother    Vision loss Brother      Current Outpatient Medications:    albuterol  (VENTOLIN  HFA) 108 (90 Base) MCG/ACT inhaler, Inhale 2 puffs into the lungs every 4 (four) hours as needed for wheezing or shortness of breath., Disp: 18 g, Rfl: 12   arformoterol  (BROVANA ) 15 MCG/2ML  NEBU, Take 2 mLs (15 mcg total) by nebulization 2 (two) times daily., Disp: 120 mL, Rfl: 12   aspirin  81 MG tablet, Take 81 mg by mouth daily., Disp: , Rfl:    Biotin 89999 MCG TBDP, Take 10,000 mcg by mouth daily., Disp: , Rfl:    bisoprolol  (ZEBETA ) 5 MG tablet, TAKE 1/2 TABLET BY MOUTH EVERY DAY, Disp: 45 tablet, Rfl: 3   budesonide  (PULMICORT ) 0.5 MG/2ML nebulizer solution, Take 2 mLs (0.5 mg total) by nebulization 2 (two) times daily., Disp: 120 mL, Rfl: 12   CALCIUM -MAGNESIUM -ZINC PO, Take 1 capsule by mouth at bedtime., Disp: , Rfl:    Cholecalciferol (VITAMIN D3) 2000 UNITS TABS, Take 2,000 Units by mouth at bedtime., Disp: , Rfl:    DULoxetine  (CYMBALTA ) 60 MG capsule, TAKE 1 CAPSULE(60 MG) BY MOUTH DAILY, Disp: 90 capsule, Rfl: 1   famotidine  (PEPCID ) 20 MG tablet, Take 1 tablet (20 mg total) by mouth daily after supper., Disp: 90 tablet, Rfl: 3   HYDROcodone  bit-homatropine (HYCODAN) 5-1.5 MG/5ML syrup, Take 5 mLs by mouth every 4 (four) hours as needed for cough., Disp: 120 mL, Rfl: 0   ipratropium-albuterol  (DUONEB) 0.5-2.5 (3) MG/3ML SOLN, Take 3  mLs by nebulization every 4 (four) hours as needed., Disp: 360 mL, Rfl: 0   Magnesium  250 MG TABS, Take 1 tablet by mouth daily., Disp: , Rfl:    methocarbamol  (ROBAXIN ) 500 MG tablet, Take 1 tablet (500 mg total) by mouth every 8 (eight) hours as needed for muscle spasms., Disp: 30 tablet, Rfl: 0   montelukast  (SINGULAIR ) 10 MG tablet, TAKE 1 TABLET(10 MG) BY MOUTH AT BEDTIME, Disp: 90 tablet, Rfl: 3   pantoprazole  (PROTONIX ) 40 MG tablet, TAKE 1 TABLET BY MOUTH 30 MINUTES BEFORE FIRST MEAL OF THE DAY, Disp: 90 tablet, Rfl: 2   predniSONE  (DELTASONE ) 10 MG tablet, 2 each am until better then 1 each am x 5 days and stop, Disp: 100 tablet, Rfl: 2   pregabalin  (LYRICA ) 100 MG capsule, Take 1 capsule (100 mg total) by mouth 3 (three) times daily., Disp: 90 capsule, Rfl: 3   promethazine  (PHENERGAN ) 25 MG tablet, Take 25 mg by mouth every 6 (six)  hours as needed for nausea or vomiting., Disp: , Rfl:    REPATHA  SURECLICK 140 MG/ML SOAJ, ADMINISTER 1 ML UNDER THE SKIN EVERY 14 DAYS, Disp: 6 mL, Rfl: 3   Semaglutide , 1 MG/DOSE, (OZEMPIC , 1 MG/DOSE,) 4 MG/3ML SOPN, Inject 1 mg into the skin once a week. Friday, Disp: 3 mL, Rfl: 3   atorvastatin  (LIPITOR) 80 MG tablet, TAKE 1 TABLET BY MOUTH DAILY ON MONDAY-FRIDAY. (Patient not taking: Reported on 09/13/2024), Disp: 90 tablet, Rfl: 1   benzonatate  (TESSALON ) 200 MG capsule, Take 1 capsule (200 mg total) by mouth 3 (three) times daily. (Patient not taking: Reported on 09/13/2024), Disp: 20 capsule, Rfl: 0   Allergies  Allergen Reactions   Other Swelling    HAIR DYE     Review of Systems  Constitutional: Negative.   Respiratory: Negative.    Cardiovascular: Negative.   Neurological: Negative.   Psychiatric/Behavioral: Negative.       Today's Vitals   09/13/24 1117  BP: 128/60  Pulse: 91  Temp: 98.4 F (36.9 C)  TempSrc: Oral  Weight: 183 lb (83 kg)  Height: 5' 5 (1.651 m)  PainSc: 9   PainLoc: Neck   Body mass index is 30.45 kg/m.  Wt Readings from Last 3 Encounters:  09/13/24 183 lb (83 kg)  09/09/24 179 lb 9.6 oz (81.5 kg)  08/13/24 172 lb (78 kg)    The ASCVD Risk score (Arnett DK, et al., 2019) failed to calculate for the following reasons:   Risk score cannot be calculated because patient has a medical history suggesting prior/existing ASCVD  Objective:  Physical Exam Vitals and nursing note reviewed.  Constitutional:      General: She is not in acute distress.    Appearance: Normal appearance.  HENT:     Head: Mass (left jaw) present.   Cardiovascular:     Rate and Rhythm: Normal rate and regular rhythm.     Pulses: Normal pulses.     Heart sounds: Normal heart sounds. No murmur heard. Pulmonary:     Effort: Pulmonary effort is normal. No respiratory distress.     Breath sounds: Normal breath sounds. No wheezing.  Musculoskeletal:        General:  Tenderness (to left neck with rotation) present. No swelling.  Skin:    General: Skin is warm and dry.  Neurological:     General: No focal deficit present.     Mental Status: She is alert and oriented to person, place, and time.  Cranial Nerves: No cranial nerve deficit.     Motor: No weakness.        Assessment And Plan:  Mass of jaw Assessment & Plan: Left jaw with firm non-mobile raised area. Will order head and neck ultrasound. Consulted with Dr. Jarold.   Orders: -     US  SOFT TISSUE HEAD & NECK (NON-THYROID ); Future  Need for influenza vaccination Assessment & Plan: Influenza vaccine administered Encouraged to take Tylenol  as needed for fever or muscle aches.   Orders: -     Flu vaccine HIGH DOSE PF(Fluzone Trivalent)  Class 1 obesity due to excess calories with body mass index (BMI) of 30.0 to 30.9 in adult, unspecified whether serious comorbidity present Assessment & Plan: She is encouraged to strive for BMI less than 30 to decrease cardiac risk. Advised to aim for at least 150 minutes of exercise per week.    Neck pain, musculoskeletal Assessment & Plan: She has pain to the left trapezius muscle. Will treat with muscle relaxer.      Return for keep same next.  Patient was given opportunity to ask questions. Patient verbalized understanding of the plan and was able to repeat key elements of the plan. All questions were answered to their satisfaction.    LILLETTE Gaines Ada, FNP, have reviewed all documentation for this visit. The documentation on 09/13/24 for the exam, diagnosis, procedures, and orders are all accurate and complete.   IF YOU HAVE BEEN REFERRED TO A SPECIALIST, IT MAY TAKE 1-2 WEEKS TO SCHEDULE/PROCESS THE REFERRAL. IF YOU HAVE NOT HEARD FROM US /SPECIALIST IN TWO WEEKS, PLEASE GIVE US  A CALL AT [phone].

## 2024-09-14 ENCOUNTER — Encounter: Payer: Self-pay | Admitting: Nurse Practitioner

## 2024-09-14 DIAGNOSIS — E6609 Other obesity due to excess calories: Secondary | ICD-10-CM | POA: Insufficient documentation

## 2024-09-14 DIAGNOSIS — M542 Cervicalgia: Secondary | ICD-10-CM | POA: Insufficient documentation

## 2024-09-14 DIAGNOSIS — Z23 Encounter for immunization: Secondary | ICD-10-CM | POA: Insufficient documentation

## 2024-09-14 NOTE — Assessment & Plan Note (Signed)
 She has pain to the left trapezius muscle. Will treat with muscle relaxer.

## 2024-09-14 NOTE — Assessment & Plan Note (Signed)
 Influenza vaccine administered Encouraged to take Tylenol as needed for fever or muscle aches.

## 2024-09-14 NOTE — Assessment & Plan Note (Signed)
 Left jaw with firm non-mobile raised area. Will order head and neck ultrasound. Consulted with Dr. Jarold.

## 2024-09-14 NOTE — Assessment & Plan Note (Signed)
 She is encouraged to strive for BMI less than 30 to decrease cardiac risk. Advised to aim for at least 150 minutes of exercise per week.

## 2024-09-15 ENCOUNTER — Ambulatory Visit: Attending: Internal Medicine

## 2024-09-15 ENCOUNTER — Other Ambulatory Visit: Payer: Self-pay

## 2024-09-15 DIAGNOSIS — R498 Other voice and resonance disorders: Secondary | ICD-10-CM | POA: Insufficient documentation

## 2024-09-15 DIAGNOSIS — J383 Other diseases of vocal cords: Secondary | ICD-10-CM | POA: Diagnosis not present

## 2024-09-15 NOTE — Therapy (Addendum)
 OUTPATIENT SPEECH LANGUAGE PATHOLOGY EVALUATION   Patient Name: [name] MRN: [id number] DOB:05/09/1946, 78 y.o., female Today's Date: 09/15/2024  PCP: Jarold Medici, MD REFERRING PROVIDER: Darlean Sharper, MD  END OF SESSION:  End of Session - 09/15/24 1736     Visit Number 1    Number of Visits 13    Date for SLP Re-Evaluation 11/26/24   due to no ST scheduled until week of 10/18/24   SLP Start Time 1405    SLP Stop Time  1446    SLP Time Calculation (min) 41 min    Activity Tolerance Patient tolerated treatment well          Past Medical History:  Diagnosis Date   Allergy  09/30/1996   Arthritis    Asthma    Complication of anesthesia    COPD (chronic obstructive pulmonary disease) (HCC)    Diabetes (HCC)    High cholesterol    History of radiation therapy    Right Lung- 12/04/21-12/11/21- Dr. Lynwood Nasuti   Hypertension    Lung cancer [facility])    Neuromuscular disorder (HCC)    Pneumonia 09/10/2017   PONV (postoperative nausea and vomiting)    Stroke (HCC) 2007   mini strokes   Past Surgical History:  Procedure Laterality Date   ABDOMINAL HYSTERECTOMY  1995   BRONCHIAL BIOPSY  10/23/2021   Procedure: BRONCHIAL BIOPSIES;  Surgeon: Brenna Adine CROME, DO;  Location: [facility];  Service: Pulmonary;;   BRONCHIAL BRUSHINGS  10/23/2021   Procedure: BRONCHIAL BRUSHINGS;  Surgeon: Brenna Adine CROME, DO;  Location: [facility];  Service: Pulmonary;;   BRONCHIAL NEEDLE ASPIRATION BIOPSY  10/23/2021   Procedure: BRONCHIAL NEEDLE ASPIRATION BIOPSIES;  Surgeon: Brenna Adine CROME, DO;  Location: [facility];  Service: Pulmonary;;   carpel tunnel surgery Right 2006   COLONOSCOPY  2014   COLOSTOMY  2007   colostomy let down  2008   endoscopy  2014   FIDUCIAL MARKER PLACEMENT  10/23/2021   Procedure: FIDUCIAL MARKER PLACEMENT;  Surgeon: Brenna Adine CROME, DO;  Location: [facility];  Service: Pulmonary;;   rotator cuff tear Left 04/14/2020   TUBAL LIGATION     VIDEO  BRONCHOSCOPY WITH ENDOBRONCHIAL NAVIGATION Right 10/23/2021   Procedure: VIDEO BRONCHOSCOPY WITH ENDOBRONCHIAL NAVIGATION;  Surgeon: Brenna Adine CROME, DO;  Location: [facility];  Service: Pulmonary;  Laterality: Right;  ION, w/ fiducial placement   VIDEO BRONCHOSCOPY WITH RADIAL ENDOBRONCHIAL ULTRASOUND  10/23/2021   Procedure: VIDEO BRONCHOSCOPY WITH RADIAL ENDOBRONCHIAL ULTRASOUND;  Surgeon: Brenna Adine CROME, DO;  Location: [facility];  Service: Pulmonary;;   Patient Active Problem List   Diagnosis Date Noted   Need for influenza vaccination 09/14/2024   Class 1 obesity due to excess calories with body mass index (BMI) of 30.0 to 30.9 in adult 09/14/2024   Neck pain, musculoskeletal 09/14/2024   Mass of jaw 09/13/2024   Preauricular lymphadenopathy 09/09/2024   Vocal cord dysfunction 07/12/2024   History of lung cancer 07/12/2024   Muscle cramps 07/12/2024   HLD (hyperlipidemia) 06/29/2024   GERD (gastroesophageal reflux disease) 06/29/2024   Chronic diastolic CHF (congestive heart failure) (HCC) 06/29/2024   Acute hypoxic respiratory failure (HCC) 06/28/2024   Night sweats 06/05/2024   Estrogen deficiency 05/31/2024   Asthmatic bronchitis , chronic (HCC) 03/17/2024   Encounter for general adult medical examination w/o abnormal findings 02/18/2024   Fecal smearing 10/20/2023   Malnutrition of moderate degree 12/14/2022   Chronic pain 12/11/2022   Dyslipidemia associated with type 2 diabetes  mellitus (HCC) 08/10/2022   Adenocarcinoma, lung, right (HCC) 05/28/2022   Hypertensive heart disease without heart failure 12/31/2021   Atherosclerosis of aorta (HCC) 09/16/2021   Atherosclerosis 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 09/16/2021   Myalgia 09/16/2021   Solitary pulmonary nodule on lung CT 08/24/2021   Chest tightness 04/18/2021   Former smoker 08/21/2020   Pure hypercholesterolemia    Type 2 diabetes mellitus with hyperglycemia (HCC) 11/09/2018    Diarrhea    Rhinovirus infection 10/02/2016   Acute asthma exacerbation 10/02/2016   COPD with acute exacerbation (HCC) 09/28/2016   SOB (shortness of breath) 09/28/2016   Essential hypertension 11/03/2014   Cough 10/19/2014   COPD GOLD II  03/21/2014   Rhinitis, nonallergic 03/21/2014    Onset date: Since high school (script 09/07/24)  REFERRING DIAG:  J38.3 (ICD-10-CM) - Vocal cord dysfunction    THERAPY DIAG:  Other voice and resonance disorders - Plan: SLP plan of care cert/re-cert  Rationale for Evaluation and Treatment: Rehabilitation  SUBJECTIVE:   SUBJECTIVE STATEMENT: I used to try to squeeze my throat in high school because I didn't want to cough.  Pt accompanied by: self  PERTINENT HISTORY: see above. Had voice therapy at [facility] in 2020.  PAIN:  Are you having pain? No  FALLS: Has patient fallen in last 6 months? No,   LIVING ENVIRONMENT: Lives with: lives alone Lives in: House/apartment  PLOF:Level of assistance: Independent with ADLs, Independent with IADLs Employment: Retired  PATIENT GOALS: Not have this tickle!  OBJECTIVE:  Note: Objective measures were completed at Evaluation unless otherwise noted.    COGNITION: Overall cognitive status: Within functional limits for tasks assessed  SOCIAL HISTORY: Occupation: retired Counsellor intake: suboptimal 32 oz daily Caffeine/alcohol intake: minimal 10-12 oz daily Daily voice use: minimal  PERCEPTUAL VOICE ASSESSMENT: Voice quality: harsh (slight) Vocal abuse: abnormal breathing pattern and habitual loudness Resonance: normal Respiratory function: thoracic breathing  OBJECTIVE VOICE ASSESSMENT: Maximum phonation time for sustained ah: 10.5 (below WNL) Conversational pitch average: 161 Hz Conversational pitch range: 104-280 Hz Conversational loudness average: 74 dB Conversational loudness range: 59-86 dB  PATIENT REPORTED OUTCOME MEASURES (PROM): Communication Participation Item  Bank: to be provided in first session                                                                                                                            TREATMENT DATE:  No charges for treatment were made on day of eval due to tx not authorized Abdominal breathing = AB  09/15/24: SLP taught pt AB and pt with limited success (15%), better when looking at hand. She could ID correct abdominal movement from SLP when shown correct and incorrect AB.  PATIENT EDUCATION: Education details: basics of VCD using VCD handout from [employer], AB, basic goals for ST Person educated: Patient Education method: Explanation, Demonstration, Verbal cues, and Handouts Education comprehension: verbalized understanding, returned demonstration, verbal cues required, and needs  further education  HOME EXERCISE PROGRAM: Eventually pt will do AB at home.  GOALS: Goals reviewed with patient? Yes  SHORT TERM GOALS: Target date: 11/12/24  Pt will perform AB at rest 80% success, 2 sessions Baseline: Goal status: INITIAL  2.  Pt will tell SLP 3 visualizations she can use for relaxation, in two sessions Baseline:  Goal status: INITIAL  3.  Pt will demonstrate 2 breathing strategies she can use for decr'ing negative breathing sx, in 3 sessions Baseline:  Goal status: INITIAL  4.  Pt will complete AB in sentence responses 80% of the time Baseline:  Goal status: INITIAL   LONG TERM GOALS: Target date: 11/26/24  Pt will improve PROM from initial administration Baseline:  Goal status: INITIAL  2.  Pt will complete AB 75% of the time in 7 minutes conversation in 3 sessions Baseline:  Goal status: INITIAL  3.  Pt will report success in mitigating negative breathing sx using relaxation techniques and/or breathing strategies between/in 3 sessions Baseline:  Goal status: INITIAL   ASSESSMENT:  CLINICAL IMPRESSION: Patient is a 78 y.o. F who was seen today for assessment of voice  due to sx VCD (which pt states she has had since high school), and report of breathing sx that inhibit full participation in conversation due to severe coughing which has had pt visit ED. Specifically, pt reports feeling like someone is pushing on her abdomen and pressing her shoulders. Reported sx of VCD are difficulty getting air into our out of lungs, cough, and wheezing. Triggers are reported as exercise (movement pt reports), and voice use. Pt did not have coughing in today's session.   OBJECTIVE IMPAIRMENTS: include voice disorder. These impairments are limiting patient from ADLs/IADLs and effectively communicating at home and in community. Factors affecting potential to achieve goals and functional outcome are severity of impairments and time of onset. Patient will benefit from skilled SLP services to address above impairments and improve overall function.  REHAB POTENTIAL: Fair given above factors  PLAN:  SLP FREQUENCY: 1-2x/week  SLP DURATION: 8 weeks  PLANNED INTERVENTIONS: Environmental controls, Internal/external aids, SLP instruction and feedback, Compensatory strategies, Patient/family education, [phone] Treatment of speech (30 or 45 min) , and relaxation and breathing techniques.    Elishua Radford, CCC-SLP 09/15/2024, 5:58 PM  Referring diagnosis? J38.3 (ICD-10-CM) - Vocal cord dysfunction Treatment diagnosis? (if different than referring diagnosis) Other Voice and Resonance Disorders What was this (referring dx) caused by? []  Surgery []  Fall [x]  Ongoing issue []  Arthritis []  Other: ____________  Laterality:  N/A []  Rt []  Lt []  Both  Check all possible CPT codes:  *CHOOSE 10 OR LESS*    See Planned Interventions listed in the Plan section of the Evaluation.

## 2024-09-16 ENCOUNTER — Ambulatory Visit
Admission: RE | Admit: 2024-09-16 | Discharge: 2024-09-16 | Disposition: A | Discharge status: Home / Self Care | Source: Ambulatory Visit | Attending: Nurse Practitioner | Admitting: Nurse Practitioner

## 2024-09-16 DIAGNOSIS — M278 Other specified diseases of jaws: Secondary | ICD-10-CM

## 2024-09-16 DIAGNOSIS — R6884 Jaw pain: Secondary | ICD-10-CM | POA: Diagnosis not present

## 2024-09-21 ENCOUNTER — Other Ambulatory Visit: Payer: Self-pay | Admitting: Internal Medicine

## 2024-09-22 ENCOUNTER — Other Ambulatory Visit: Payer: Self-pay | Admitting: Internal Medicine

## 2024-09-22 ENCOUNTER — Other Ambulatory Visit: Payer: Self-pay | Admitting: Nurse Practitioner

## 2024-09-22 DIAGNOSIS — M278 Other specified diseases of jaws: Secondary | ICD-10-CM

## 2024-09-22 DIAGNOSIS — M542 Cervicalgia: Secondary | ICD-10-CM

## 2024-09-22 MED ORDER — OXYCODONE-ACETAMINOPHEN 10-325 MG PO TABS: 1.0000 | ORAL_TABLET | Freq: Four times a day (QID) | ORAL | 0 refills | Status: DC | PRN

## 2024-09-23 ENCOUNTER — Ambulatory Visit: Payer: Self-pay | Admitting: Nurse Practitioner

## 2024-09-25 ENCOUNTER — Other Ambulatory Visit: Payer: Self-pay | Admitting: Internal Medicine

## 2024-09-28 ENCOUNTER — Encounter (HOSPITAL_COMMUNITY): Payer: Self-pay

## 2024-09-28 ENCOUNTER — Encounter: Payer: Self-pay | Admitting: Internal Medicine

## 2024-09-28 ENCOUNTER — Other Ambulatory Visit: Payer: Self-pay | Admitting: Nurse Practitioner

## 2024-09-28 MED ORDER — METHOCARBAMOL 500 MG PO TABS: 500.0000 mg | ORAL_TABLET | Freq: Three times a day (TID) | ORAL | 0 refills | Status: DC | PRN

## 2024-09-28 NOTE — Progress Notes (Signed)
 Karalee Wilkie POUR, MD  Zyair Russi Approved for US  guided FNA biopsy as requested.  No sedation.  HKM       Previous Messages    ----- Message ----- From: Harleen Fineberg Sent: 09/27/2024   2:19 PM EDT To: Ir Procedure Requests Subject: FW: US  FNA BIOPSY SALIVARY GLAND PAROTID GLA*   ----- Message ----- From: Syndey Jaskolski Sent: 09/22/2024   4:24 PM EDT To: Yumiko Alkins; Ir Procedure Requests Subject: US  FNA BIOPSY SALIVARY GLAND PAROTID GLAND E*  Procedure : US  FNA BIOPSY SALIVARY GLAND PAROTID GLAND EA ADDT'L LESION  Reason : mass of jaw, abnormal ultrasound Dx: Mass of jaw [M27.8 (ICD-10-CM)]    History : US  soft tissue head and neck  Provider : Jarold Medici, MD  Contact : [phone]

## 2024-09-29 ENCOUNTER — Encounter: Payer: Self-pay | Admitting: Nurse Practitioner

## 2024-09-30 ENCOUNTER — Telehealth (INDEPENDENT_AMBULATORY_CARE_PROVIDER_SITE_OTHER): Payer: Self-pay | Admitting: Otolaryngology

## 2024-09-30 ENCOUNTER — Ambulatory Visit: Admitting: Internal Medicine

## 2024-09-30 ENCOUNTER — Telehealth: Payer: Self-pay | Admitting: *Deleted

## 2024-09-30 NOTE — Telephone Encounter (Signed)
 CALLED PATIENT TO INFORM OF CT FOR 10-12-24- ARRIVAL TIME- 2:15 PM @ [facility], NO RESTRICTIONS TO SCAN, PATIENT TO RECEIVE RESULTS FROM DR. KINARD ON 10-18-24 @ 11:15 AM, SPOKE WITH PATIENT AND SHE IS AWARE OF THESE APPTS. AND THE INSTRUCTIONS

## 2024-09-30 NOTE — Telephone Encounter (Signed)
 LVM for patient to call back to reschedule - Provider out of office

## 2024-10-01 ENCOUNTER — Institutional Professional Consult (permissible substitution) (INDEPENDENT_AMBULATORY_CARE_PROVIDER_SITE_OTHER): Admitting: Otolaryngology

## 2024-10-05 ENCOUNTER — Telehealth: Payer: Self-pay

## 2024-10-05 NOTE — Telephone Encounter (Signed)
 Copied from CRM #[id number]. Topic: Clinical - Medical Advice >> Oct 05, 2024  9:03 AM Chantha C wrote: Reason for CRM: Patient states about 3 weeks ago spoke with someone to pick up the oxygen machine because patient does not need it anymore. However, DME [employer] [phone] will not pick ithe oxygen machine without the provider order for a pick up from patient's home. Patient states has not heard from anyone and the oxygen machine is still at her house. Patient states cannot get a hold of [employer], unsure if they received the office's order. Please advise and call back [phone].  Called and spoke with the pt and advised [employer] received order to d/c oxygen on 9/12.  Pt states she has been reaching out to [employer] which is not her DME company.  I have provided pt with [employer] phone number so she can contact them.  Nfn

## 2024-10-08 DIAGNOSIS — J441 Chronic obstructive pulmonary disease with (acute) exacerbation: Secondary | ICD-10-CM

## 2024-10-12 ENCOUNTER — Ambulatory Visit (HOSPITAL_COMMUNITY)
Admission: RE | Admit: 2024-10-12 | Discharge: 2024-10-12 | Disposition: A | Discharge status: Home / Self Care | Source: Ambulatory Visit | Attending: Radiology | Admitting: Radiology

## 2024-10-12 DIAGNOSIS — C3491 Malignant neoplasm of unspecified part of right bronchus or lung: Secondary | ICD-10-CM | POA: Diagnosis not present

## 2024-10-12 DIAGNOSIS — I700 Atherosclerosis of aorta: Secondary | ICD-10-CM | POA: Diagnosis not present

## 2024-10-12 DIAGNOSIS — J432 Centrilobular emphysema: Secondary | ICD-10-CM | POA: Diagnosis not present

## 2024-10-12 DIAGNOSIS — I2510 Atherosclerotic heart disease of native coronary artery without angina pectoris: Secondary | ICD-10-CM | POA: Diagnosis not present

## 2024-10-12 DIAGNOSIS — C3490 Malignant neoplasm of unspecified part of unspecified bronchus or lung: Secondary | ICD-10-CM | POA: Diagnosis not present

## 2024-10-13 ENCOUNTER — Encounter: Payer: Self-pay | Admitting: Nurse Practitioner

## 2024-10-15 NOTE — Telephone Encounter (Signed)
 See other Mychart encounter. Closing this.

## 2024-10-16 NOTE — Progress Notes (Signed)
 Radiation Oncology         [phone] ________________________________  Name: [name] MRN: [id number]  Date: 10/18/2024  DOB: Oct 26, 1946  Follow-Up Visit Note  CC: Jarold Medici, MD  Brenna Adine CROME, DO  No diagnosis found.  Diagnosis: Adenocarcinoma - RUL pulmonary nodule, clinical stage I; s/p SBRT completed on 12/11/2021   Interval Since Last Radiation: 2 years, 10 months, and 7 days   Intent: Curative  Radiation Treatment Dates: 12/04/2021 through 12/11/2021 Site Technique Total Dose (Gy) Dose per Fx (Gy) Completed Fx Beam Energies  Lung, Right: Lung_Rt IMRT 54/54 18 3/3 6XFFF    Narrative:  The patient returns today for routine follow-up and to review recent imaging. She was last seen here for follow-up on 04/12/24.   In the interval since her last visit, she was hospitalized from 06/28/24 through 07/04/24 for management of an acute COPD exacerbation w/ hypoxic respiratory failure. Imaging performed while inpatient consisted of a CTA of the chest which demonstrated: a new 3 mm nodule in the anterior left upper lobe; stability of the 5 mm perifissural nodule in the right middle lobe; similar soft tissue fullness in the upper right hilar region with a slight increase in size of the inferior index right hilar lymph node measuring 13 mm, previously measuring 10 mm; similar post treatment scarring in the suprahilar posterior right lung; and no evidence of PE. She did have significant wheezing despite the administered nebulizer regimen and pulmonology was consulted who suspected upper airway issues with VCD. Overall, her hospital course consisted of supplemental O2, yupelri , Brovana , Pulmicort , and Duonebs as well as steroids and Tessalon  perles and Hycodan cough syrup.           She also developed a lump on her left jaw line which she first noticed this past August. Dr. Darlean did give her some abx for this without improvement. She brought this to the attention of her PCP who  ordered a soft tissue head and neck CT on 09/16/24 which demonstrated 2 adjacent abnormal hypoechoic soft tissue lesions corresponding to the palpable site of concern on the left jaw. Differential considerations were noted to include pathologic lymphadenopathy vs a benign etiology or malignant salivary neoplasm.  She has been referred to ENT and is scheduled to meet with Dr. Roark on consultation on 11/07 for further evaluation and management. She is also scheduled to undergo biopsies of the left jaw lump on 10/28/24.    She has also continued to follow with Dr. Darlean and his team at [facility]. She was most recently seen by Dr. Darlean on 09/09/24. Per encounter notes, her recent COPD exacerbation was secondary to her discontinuing her maintenance therapies for COPD. Her respiratory status has since improved after resuming her inhaler regimen.   Her most recent chest CT on 10/12/24 demonstrates: ***.   Other relevant imaging performed in the interval since her last visit includes a bone density scan on 06/10/24 which showed normal findings.                            Allergies:  is allergic to other.  Meds: Current Outpatient Medications  Medication Sig Dispense Refill   albuterol  (VENTOLIN  HFA) 108 (90 Base) MCG/ACT inhaler Inhale 2 puffs into the lungs every 4 (four) hours as needed for wheezing or shortness of breath. 18 g 12   arformoterol  (BROVANA ) 15 MCG/2ML NEBU Take 2 mLs (15 mcg total) by nebulization 2 (two)  times daily. 120 mL 12   aspirin  81 MG tablet Take 81 mg by mouth daily.     atorvastatin  (LIPITOR) 80 MG tablet TAKE 1 TABLET BY MOUTH EVERY DAY MONDAY THROUGH FRIDAY 90 tablet 1   benzonatate  (TESSALON ) 200 MG capsule Take 1 capsule (200 mg total) by mouth 3 (three) times daily. (Patient not taking: Reported on 09/13/2024) 20 capsule 0   Biotin 89999 MCG TBDP Take 10,000 mcg by mouth daily.     bisoprolol  (ZEBETA ) 5 MG tablet TAKE 1/2 TABLET BY MOUTH EVERY DAY 45 tablet 3    budesonide  (PULMICORT ) 0.5 MG/2ML nebulizer solution Take 2 mLs (0.5 mg total) by nebulization 2 (two) times daily. 120 mL 12   CALCIUM -MAGNESIUM -ZINC PO Take 1 capsule by mouth at bedtime.     Cholecalciferol (VITAMIN D3) 2000 UNITS TABS Take 2,000 Units by mouth at bedtime.     DULoxetine  (CYMBALTA ) 60 MG capsule TAKE 1 CAPSULE(60 MG) BY MOUTH DAILY 90 capsule 1   famotidine  (PEPCID ) 20 MG tablet Take 1 tablet (20 mg total) by mouth daily after supper. 90 tablet 3   HYDROcodone  bit-homatropine (HYCODAN) 5-1.5 MG/5ML syrup Take 5 mLs by mouth every 4 (four) hours as needed for cough. 120 mL 0   ipratropium-albuterol  (DUONEB) 0.5-2.5 (3) MG/3ML SOLN Take 3 mLs by nebulization every 4 (four) hours as needed. 360 mL 0   Magnesium  250 MG TABS Take 1 tablet by mouth daily.     methocarbamol  (ROBAXIN ) 500 MG tablet Take 1 tablet (500 mg total) by mouth every 8 (eight) hours as needed for muscle spasms. 30 tablet 0   montelukast  (SINGULAIR ) 10 MG tablet TAKE 1 TABLET(10 MG) BY MOUTH AT BEDTIME 90 tablet 3   oxyCODONE -acetaminophen  (PERCOCET) 10-325 MG tablet Take 1 tablet by mouth every 6 (six) hours as needed for pain. 20 tablet 0   pantoprazole  (PROTONIX ) 40 MG tablet TAKE 1 TABLET BY MOUTH 30 MINUTES BEFORE FIRST MEAL OF THE DAY 90 tablet 2   predniSONE  (DELTASONE ) 10 MG tablet 2 each am until better then 1 each am x 5 days and stop 100 tablet 2   pregabalin  (LYRICA ) 100 MG capsule TAKE 1 CAPSULE(100 MG) BY MOUTH THREE TIMES DAILY 90 capsule 2   promethazine  (PHENERGAN ) 25 MG tablet Take 25 mg by mouth every 6 (six) hours as needed for nausea or vomiting.     REPATHA  SURECLICK 140 MG/ML SOAJ ADMINISTER 1 ML UNDER THE SKIN EVERY 14 DAYS 6 mL 3   Semaglutide , 1 MG/DOSE, (OZEMPIC , 1 MG/DOSE,) 4 MG/3ML SOPN Inject 1 mg into the skin once a week. Friday 3 mL 3   No current facility-administered medications for this encounter.    Physical Findings: The patient is in no acute distress. Patient is alert  and oriented.  vitals were not taken for this visit. .  No significant changes. Lungs are clear to auscultation bilaterally. Heart has regular rate and rhythm. No palpable cervical, supraclavicular, or axillary adenopathy. Abdomen soft, non-tender, normal bowel sounds.   Lab Findings: Lab Results  Component Value Date   WBC 11.0 (H) 07/03/2024   HGB 12.2 07/03/2024   HCT 38.1 07/03/2024   MCV 89.6 07/03/2024   PLT 406 (H) 07/03/2024    Radiographic Findings: No results found.  Impression:  Adenocarcinoma - RUL pulmonary nodule, clinical stage I; s/p SBRT completed on 12/11/2021   The patient is recovering from the effects of radiation.  ***  Plan:  ***   *** minutes of total  time was spent for this patient encounter, including preparation, face-to-face counseling with the patient and coordination of care, physical exam, and documentation of the encounter. ____________________________________  Lynwood CHARM Nasuti, PhD, MD  This document serves as a record of services personally performed by Lynwood Nasuti, MD. It was created on his behalf by Dorthy Fuse, a trained medical scribe. The creation of this record is based on the scribe's personal observations and the provider's statements to them. This document has been checked and approved by the attending provider.

## 2024-10-18 ENCOUNTER — Ambulatory Visit
Admission: RE | Admit: 2024-10-18 | Discharge: 2024-10-18 | Disposition: A | Source: Ambulatory Visit | Attending: Radiation Oncology | Admitting: Radiation Oncology

## 2024-10-18 ENCOUNTER — Ambulatory Visit
Admission: RE | Admit: 2024-10-18 | Discharge: 2024-10-18 | Disposition: A | Discharge status: Home / Self Care | Payer: Self-pay | Source: Ambulatory Visit | Attending: Radiation Oncology | Admitting: Radiation Oncology

## 2024-10-18 ENCOUNTER — Encounter: Payer: Self-pay | Admitting: Radiation Oncology

## 2024-10-18 VITALS — BP 133/71 | HR 106 | Temp 97.9°F | Resp 20 | Ht 65.0 in | Wt 174.6 lb

## 2024-10-18 DIAGNOSIS — Z85118 Personal history of other malignant neoplasm of bronchus and lung: Secondary | ICD-10-CM | POA: Diagnosis not present

## 2024-10-18 DIAGNOSIS — J432 Centrilobular emphysema: Secondary | ICD-10-CM | POA: Diagnosis not present

## 2024-10-18 DIAGNOSIS — Z7951 Long term (current) use of inhaled steroids: Secondary | ICD-10-CM | POA: Insufficient documentation

## 2024-10-18 DIAGNOSIS — Z923 Personal history of irradiation: Secondary | ICD-10-CM | POA: Diagnosis not present

## 2024-10-18 DIAGNOSIS — N281 Cyst of kidney, acquired: Secondary | ICD-10-CM | POA: Insufficient documentation

## 2024-10-18 DIAGNOSIS — Z7982 Long term (current) use of aspirin: Secondary | ICD-10-CM | POA: Diagnosis not present

## 2024-10-18 DIAGNOSIS — J449 Chronic obstructive pulmonary disease, unspecified: Secondary | ICD-10-CM | POA: Insufficient documentation

## 2024-10-18 DIAGNOSIS — Z79899 Other long term (current) drug therapy: Secondary | ICD-10-CM | POA: Insufficient documentation

## 2024-10-18 DIAGNOSIS — I700 Atherosclerosis of aorta: Secondary | ICD-10-CM | POA: Insufficient documentation

## 2024-10-18 DIAGNOSIS — C3491 Malignant neoplasm of unspecified part of right bronchus or lung: Secondary | ICD-10-CM

## 2024-10-18 DIAGNOSIS — C3401 Malignant neoplasm of right main bronchus: Secondary | ICD-10-CM | POA: Diagnosis not present

## 2024-10-18 DIAGNOSIS — R220 Localized swelling, mass and lump, head: Secondary | ICD-10-CM | POA: Insufficient documentation

## 2024-10-18 DIAGNOSIS — J439 Emphysema, unspecified: Secondary | ICD-10-CM | POA: Diagnosis not present

## 2024-10-18 DIAGNOSIS — I2510 Atherosclerotic heart disease of native coronary artery without angina pectoris: Secondary | ICD-10-CM | POA: Insufficient documentation

## 2024-10-18 LAB — CMP (CANCER CENTER ONLY)
ALT: 13 U/L (ref 0–44)
AST: 22 U/L (ref 15–41)
Albumin: 3.7 g/dL (ref 3.5–5.0)
Alkaline Phosphatase: 63 U/L (ref 38–126)
Anion gap: 4 — ABNORMAL LOW (ref 5–15)
BUN: 8 mg/dL (ref 8–23)
CO2: 32 mmol/L (ref 22–32)
Calcium: 9.5 mg/dL (ref 8.9–10.3)
Chloride: 104 mmol/L (ref 98–111)
Creatinine: 0.8 mg/dL (ref 0.44–1.00)
GFR, Estimated: >60 mL/min (ref >60)
Glucose, Bld: 145 mg/dL — ABNORMAL HIGH (ref 70–99)
Potassium: 3.8 mmol/L (ref 3.5–5.1)
Sodium: 140 mmol/L (ref 135–145)
Total Bilirubin: 0.4 mg/dL (ref 0.0–1.2)
Total Protein: 6.8 g/dL (ref 6.5–8.1)

## 2024-10-18 NOTE — Progress Notes (Signed)
 [name] is here today for follow up post radiation to the lung.  Lung Side: Right,patient completed treatment on 12/11/21.  Does the patient complain of any of the following: Pain Reports intermittent pains to chest at times. Shortness of breath w/wo exertion: yes, mostly on exertion.  Cough: Yes, productive Hemoptysis: No Pain with swallowing: No Swallowing/choking concerns: no Appetite: Fair  Energy Level: Low Post radiation skin Changes: No     Additional comments if applicable:  Reports having a enlarged area to left side of  face that will  require biopsy on 10/28/24.  BP 133/71 (BP Location: Left Arm, Patient Position: Sitting, Cuff Size: Large)   Pulse (!) 106   Temp 97.9 F (36.6 C)   Resp 20   Ht 5' 5 (1.651 m)   Wt 174 lb 9.6 oz (79.2 kg)   SpO2 97%   BMI 29.05 kg/m

## 2024-10-19 ENCOUNTER — Ambulatory Visit

## 2024-10-21 ENCOUNTER — Ambulatory Visit

## 2024-10-26 ENCOUNTER — Ambulatory Visit

## 2024-10-26 ENCOUNTER — Telehealth: Payer: Self-pay | Admitting: *Deleted

## 2024-10-26 NOTE — Telephone Encounter (Signed)
 CALLED PATIENT TO INFORM OF CT FOR 10-29-24- ARRIVAL TIME- 2:15 PM @ [facility],  NO RESTRICTIONS TO SCAN, PATIENT TO RECEIVE RESULTS FROM DR. KINARD ON 11-01-24 @ 10:45 AM, SPOKE WITH PATIENT AND SHE IS AWARE OF THESE APPTS. AND THE INSTRUCTIONS

## 2024-10-27 ENCOUNTER — Encounter: Payer: Self-pay | Admitting: Pharmacist

## 2024-10-27 NOTE — Progress Notes (Signed)
   10/27/2024  Patient ID: [name], female   DOB: 1946-09-19, 78 y.o.   MRN: [id number]  Patient has been receiving Ozempic  through [employer]'s Patient Assistance Program.  She is eligible to get one more refill before the end of the year. Clinic staff partially completed a refill/reorder form. The form was completed and faxed to Luke Mall, CPhT for processing and scanning.  Patient was called. HIPAA identifiers were obtained. It was explained to the Patient that she will not be able to get Ozempic  through [employer]'s Program for 2026.  She was advised to review her insurance formulary to make sure Ozempic  will be covered.   Lab Results  Component Value Date   HGBA1C 6.4 (H) 05/31/2024   HGBA1C 6.3 (H) 02/18/2024   HGBA1C 6.2 (H) 10/20/2023    Cassius DOROTHA Brought, PharmD, BCACP Clinical Pharmacist [phone]

## 2024-10-28 ENCOUNTER — Ambulatory Visit

## 2024-10-28 ENCOUNTER — Ambulatory Visit (HOSPITAL_COMMUNITY)
Admission: RE | Admit: 2024-10-28 | Discharge: 2024-10-28 | Disposition: A | Discharge status: Home / Self Care | Source: Ambulatory Visit | Attending: Internal Medicine | Admitting: Internal Medicine

## 2024-10-28 ENCOUNTER — Other Ambulatory Visit (HOSPITAL_COMMUNITY): Payer: Self-pay

## 2024-10-28 DIAGNOSIS — M278 Other specified diseases of jaws: Secondary | ICD-10-CM

## 2024-10-28 DIAGNOSIS — R221 Localized swelling, mass and lump, neck: Secondary | ICD-10-CM | POA: Diagnosis present

## 2024-10-28 DIAGNOSIS — Z85118 Personal history of other malignant neoplasm of bronchus and lung: Secondary | ICD-10-CM | POA: Diagnosis not present

## 2024-10-28 DIAGNOSIS — R220 Localized swelling, mass and lump, head: Secondary | ICD-10-CM | POA: Diagnosis not present

## 2024-10-28 MED ORDER — LIDOCAINE HCL 1 % IJ SOLN
INTRAMUSCULAR | Status: AC
  Filled 2024-10-28: qty 20

## 2024-10-28 NOTE — Procedures (Signed)
 Vascular and Interventional Radiology Procedure Note  Patient: [name] DOB: 01/08/1946 Medical Record Number: [id number] Note Date/Time: 10/28/24 1:00 PM   Performing Physician: Thom Hall, MD Assistant(s): None  Diagnosis: L neck mass. Hx R lung CA   Procedure: LEFT NECK MASS BIOPSY  Anesthesia: Local Anesthetic Complications: None Estimated Blood Loss: Minimal Specimens: Sent for Pathology  Findings:  Successful Ultrasound-guided biopsy of a L submandibular neck mass.  A total of 3 samples were obtained. Hemostasis of the tract was achieved using Manual Pressure.  Plan: Bed rest for 0 hours.  See detailed procedure note with images in PACS. The patient tolerated the procedure well without incident or complication and was returned to Recovery in stable condition.    Thom Hall, MD [facility]   Pager. [phone] Clinic. [phone]

## 2024-10-29 ENCOUNTER — Ambulatory Visit (HOSPITAL_COMMUNITY)
Admission: RE | Admit: 2024-10-29 | Discharge: 2024-10-29 | Disposition: A | Discharge status: Home / Self Care | Source: Ambulatory Visit | Attending: Radiation Oncology | Admitting: Radiation Oncology

## 2024-10-29 ENCOUNTER — Other Ambulatory Visit (HOSPITAL_COMMUNITY)

## 2024-10-29 DIAGNOSIS — C3491 Malignant neoplasm of unspecified part of right bronchus or lung: Secondary | ICD-10-CM | POA: Diagnosis not present

## 2024-10-29 DIAGNOSIS — K8689 Other specified diseases of pancreas: Secondary | ICD-10-CM | POA: Diagnosis not present

## 2024-10-29 DIAGNOSIS — K5730 Diverticulosis of large intestine without perforation or abscess without bleeding: Secondary | ICD-10-CM | POA: Diagnosis not present

## 2024-10-29 MED ORDER — IOHEXOL 300 MG/ML  SOLN
100.0000 mL | Freq: Once | INTRAMUSCULAR | Status: AC | PRN
  Administered 2024-10-29: 100 mL via INTRAVENOUS

## 2024-10-29 MED ORDER — SODIUM CHLORIDE (PF) 0.9 % IJ SOLN
INTRAMUSCULAR | Status: AC
  Filled 2024-10-29: qty 50

## 2024-10-31 NOTE — Progress Notes (Signed)
 Radiation Oncology         [phone] ________________________________  Name: [name] MRN: [id number]  Date: 11/01/2024  DOB: 30-Mar-1946  Follow-Up Visit Note  CC: Jarold Medici, MD  Brenna Adine CROME, DO  No diagnosis found.  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 She was last seen here for follow-up on 10/18/24.    To review from her last visit, her most recent chest CT performed on 10/12/24 showed a new 3.7 x 3.7 cm ill-defined low-density lesion in the dome of the left liver. Further imaging was recommended to evaluate this finding and she accordingly presented for a a CT AP w/ contrast on 10/29/24 which demonstrated: ***  As noted during her previous visit, she also presented for a biopsy of the left back mass on 10/28/24. 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 She has been scheduled to follow up with Dr. Roark [facility]) on 11/07 to review these findings in detail.   ***                          Allergies:  is allergic to other.  Meds: Current Outpatient Medications  Medication Sig Dispense Refill   albuterol  (VENTOLIN  HFA) 108 (90 Base) MCG/ACT inhaler Inhale 2 puffs into the lungs every 4 (four) hours as needed for wheezing or shortness of breath. 18 g 12   arformoterol  (BROVANA ) 15 MCG/2ML NEBU Take 2 mLs (15 mcg total) by nebulization 2 (two) times daily. 120 mL 12   aspirin  81 MG tablet Take 81 mg by mouth daily.     atorvastatin  (LIPITOR) 80 MG tablet TAKE 1 TABLET BY MOUTH EVERY DAY MONDAY THROUGH FRIDAY 90  tablet 1   benzonatate  (TESSALON ) 200 MG capsule Take 1 capsule (200 mg total) by mouth 3 (three) times daily. (Patient not taking: Reported on 09/13/2024) 20 capsule 0   Biotin 89999 MCG TBDP Take 10,000 mcg by mouth daily.     bisoprolol  (ZEBETA ) 5 MG tablet TAKE 1/2 TABLET BY MOUTH EVERY DAY 45 tablet 3   budesonide  (PULMICORT ) 0.5 MG/2ML nebulizer solution Take 2 mLs (0.5 mg total) by nebulization 2 (two) times daily. 120 mL 12   CALCIUM -MAGNESIUM -ZINC PO Take 1 capsule by mouth at bedtime.     Cholecalciferol (VITAMIN D3) 2000 UNITS TABS Take 2,000 Units by mouth at bedtime.     DULoxetine  (CYMBALTA ) 60 MG capsule TAKE 1 CAPSULE(60 MG) BY MOUTH DAILY 90 capsule 1   famotidine  (PEPCID ) 20 MG tablet Take 1 tablet (20 mg total) by mouth daily after supper. 90 tablet 3   ipratropium-albuterol  (DUONEB) 0.5-2.5 (3) MG/3ML SOLN Take 3 mLs by nebulization every 4 (four) hours as needed. 360 mL 0   Magnesium  250 MG TABS Take 1 tablet by mouth daily.     methocarbamol  (ROBAXIN ) 500 MG tablet Take 1 tablet (500 mg total) by mouth every 8 (eight) hours as needed for muscle spasms. 30 tablet 0   montelukast  (SINGULAIR ) 10 MG tablet TAKE 1 TABLET(10 MG) BY MOUTH AT BEDTIME 90 tablet 3  oxyCODONE -acetaminophen  (PERCOCET) 10-325 MG tablet Take 1 tablet by mouth every 6 (six) hours as needed for pain. 20 tablet 0   pantoprazole  (PROTONIX ) 40 MG tablet TAKE 1 TABLET BY MOUTH 30 MINUTES BEFORE FIRST MEAL OF THE DAY 90 tablet 2   predniSONE  (DELTASONE ) 10 MG tablet 2 each am until better then 1 each am x 5 days and stop 100 tablet 2   pregabalin  (LYRICA ) 100 MG capsule TAKE 1 CAPSULE(100 MG) BY MOUTH THREE TIMES DAILY 90 capsule 2   promethazine  (PHENERGAN ) 25 MG tablet Take 25 mg by mouth every 6 (six) hours as needed for nausea or vomiting.     REPATHA  SURECLICK 140 MG/ML SOAJ ADMINISTER 1 ML UNDER THE SKIN EVERY 14 DAYS 6 mL 3   Semaglutide , 1 MG/DOSE, (OZEMPIC , 1 MG/DOSE,) 4 MG/3ML SOPN Inject 1 mg into the  skin once a week. Friday 3 mL 3   No current facility-administered medications for this encounter.    Physical Findings: The patient is in no acute distress. Patient is alert and oriented.  vitals were not taken for this visit. .  No significant changes. Lungs are clear to auscultation bilaterally. Heart has regular rate and rhythm. No palpable cervical, supraclavicular, or axillary adenopathy. Abdomen soft, non-tender, normal bowel sounds.   Lab Findings: Lab Results  Component Value Date   WBC 11.0 (H) 07/03/2024   HGB 12.2 07/03/2024   HCT 38.1 07/03/2024   MCV 89.6 07/03/2024   PLT 406 (H) 07/03/2024    Radiographic Findings: US  CORE BIOPSY (SOFT TISSUE) Result Date: 10/28/2024 INDICATION: mass of jaw, abnormal ultrasound EXAM: ULTRASOUND-GUIDED LEFT SUBMANDIBULAR MASS BIOPSY COMPARISON:  HEAD AND NECK ULTRASOUND, 09/16/2024. MEDICATIONS: None ANESTHESIA/SEDATION: Local anesthetic was administered. COMPLICATIONS: None immediate. TECHNIQUE: Informed written consent was obtained from the patient after a discussion of the risks, benefits and alternatives to treatment. Questions regarding the procedure were encouraged and answered. Initial ultrasound scanning demonstrated a dominant 2 cm subcutaneous mass at the LEFT submandibular neck. An ultrasound image was saved for documentation purposes. The procedure was planned. A timeout was performed prior to the initiation of the procedure. The operative was prepped and draped in the usual sterile fashion, and a sterile drape was applied covering the operative field. A timeout was performed prior to the initiation of the procedure. Local anesthesia was provided with 1% lidocaine  with epinephrine. Under direct ultrasound guidance, an 18 gauge core needle device was utilized to obtain to obtain 3 core needle biopsies of the LEFT submandibular mass. The samples were placed in saline and submitted to pathology. The needle was removed and superficial  hemostasis was achieved with manual compression. Post procedure scan was negative for significant hematoma. A dressing was applied. The patient tolerated the procedure well without immediate postprocedural complication. IMPRESSION: Successful ultrasound guided biopsy of the larger LEFT submandibular neck mass. Thom Hall, MD [facility] Electronically Signed   By: Thom Hall M.D.   On: 10/28/2024 14:59   CT CHEST WO CONTRAST Result Date: 10/17/2024 CLINICAL DATA:  Non-small-cell lung cancer. Restaging. * Tracking Code: BO * EXAM: CT CHEST WITHOUT CONTRAST TECHNIQUE: Multidetector CT imaging of the chest was performed following the standard protocol without IV contrast. RADIATION DOSE REDUCTION: This exam was performed according to the departmental dose-optimization program which includes automated exposure control, adjustment of the mA and/or kV according to patient size and/or use of iterative reconstruction technique. COMPARISON:  06/28/2024 FINDINGS: Cardiovascular: The heart size is normal. No substantial pericardial  effusion. Coronary artery calcification is evident. Moderate atherosclerotic calcification is noted in the wall of the thoracic aorta. Mediastinum/Nodes: No mediastinal lymphadenopathy. No evidence for gross hilar lymphadenopathy although assessment is limited by the lack of intravenous contrast on the current study. The index right upper and lower right hilar nodes measured on the 06/28/2024 exam are not well demonstrated on today's noncontrast study. The esophagus has normal imaging features. There is no axillary lymphadenopathy. Lungs/Pleura: Centrilobular and paraseptal emphysema evident. No substantial change in appearance of post treatment scarring in the suprahilar right lung. 5 mm posterior right middle lobe pulmonary nodule measured previously is stable in the interval. The 3 mm new left upper lobe pulmonary nodule identified  previously has resolved in the interval. No new suspicious pulmonary nodule or mass. No pleural effusion. Upper Abdomen: 3.7 x 3.7 cm ill-defined low-density lesion is identified in the dome of the left liver on image 133/2. This is discernible in retrospect measuring about 1.6 x 1.4 cm on the 06/28/2024 exam and new since the chest CT of 04/08/2024. Small cyst posterior right kidney is similar to priors. Musculoskeletal: No worrisome lytic or sclerotic osseous abnormality. IMPRESSION: 1. Stable appearance of post treatment scarring in the suprahilar right lung. 2. Stable 5 mm posterior right middle lobe pulmonary nodule. 3. The 3 mm new left upper lobe pulmonary nodule identified previously has resolved in the interval. 4. 3.7 x 3.7 cm ill-defined low-density lesion in the dome of the left liver. This is discernible in retrospect measuring about 1.6 x 1.4 cm on the 06/28/2024 exam and new since the chest CT of 04/08/2024. Dedicated abdomen pelvis CT with contrast or abdominal MRI with and without contrast recommended to further evaluate. 5. Aortic Atherosclerosis (ICD10-I70.0) and Emphysema (ICD10-J43.9). These results will be called to the ordering clinician or representative by the Radiologist Assistant, and communication documented in the PACS or [employer]. Electronically Signed   By: Camellia Candle M.D.   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  ***  Plan:  ***   *** minutes of total time was spent for this patient encounter, including preparation, face-to-face counseling with the patient and coordination of care, physical exam, and documentation of the encounter. ____________________________________  Lynwood CHARM Nasuti, PhD, MD  This document serves as a record of services personally performed by Lynwood Nasuti, MD. It was created on his behalf by  Dorthy Fuse, a trained medical scribe. The creation of this record is based on the scribe's personal observations and the provider's statements to them. This document has been checked and approved by the attending provider.

## 2024-11-01 ENCOUNTER — Encounter: Payer: Self-pay | Admitting: Radiation Oncology

## 2024-11-01 ENCOUNTER — Ambulatory Visit
Admission: RE | Admit: 2024-11-01 | Discharge: 2024-11-01 | Disposition: A | Discharge status: Home / Self Care | Source: Ambulatory Visit | Attending: Radiation Oncology | Admitting: Radiation Oncology

## 2024-11-01 ENCOUNTER — Telehealth: Payer: Self-pay

## 2024-11-01 DIAGNOSIS — C3491 Malignant neoplasm of unspecified part of right bronchus or lung: Secondary | ICD-10-CM | POA: Insufficient documentation

## 2024-11-01 DIAGNOSIS — C3401 Malignant neoplasm of right main bronchus: Secondary | ICD-10-CM | POA: Diagnosis not present

## 2024-11-01 DIAGNOSIS — Z87891 Personal history of nicotine dependence: Secondary | ICD-10-CM | POA: Diagnosis not present

## 2024-11-01 NOTE — Therapy (Signed)
 [place] [facility] [address], [address] [place], [place], [address] Phone: [phone]   Fax:  [phone]  Patient Details  Name: [name] MRN: [id number] Date of Birth: Jun 24, 1946 Referring Provider:  Darlean Sharper, MD Encounter Date: 11/01/2024  SPEECH THERAPY DISCHARGE SUMMARY  Visits from Start of Care: 1 (Evaluation)  Current functional level related to goals / functional outcomes: Pt was seen for evaluation and made appointments but called on 10/29/24 and cancelled her appointments due to financial concerns. Goals and impression from 09/15/24 (day of eval) are below:  SHORT TERM GOALS: Target date: 11/12/24   Pt will perform AB at rest 80% success, 2 sessions Baseline: Goal status: INITIAL   2.  Pt will tell SLP 3 visualizations she can use for relaxation, in two sessions Baseline:  Goal status: INITIAL   3.  Pt will demonstrate 2 breathing strategies she can use for decr'ing negative breathing sx, in 3 sessions Baseline:  Goal status: INITIAL   4.  Pt will complete AB in sentence responses 80% of the time Baseline:  Goal status: INITIAL     LONG TERM GOALS: Target date: 11/26/24   Pt will improve PROM from initial administration Baseline:  Goal status: INITIAL   2.  Pt will complete AB 75% of the time in 7 minutes conversation in 3 sessions Baseline:  Goal status: INITIAL   3.  Pt will report success in mitigating negative breathing sx using relaxation techniques and/or breathing strategies between/in 3 sessions Baseline:  Goal status: INITIAL     ASSESSMENT:   CLINICAL IMPRESSION: Patient is a 78 y.o. 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 Specifically, pt reports feeling like someone is pushing on her abdomen  and pressing her shoulders. Reported sx of VCD are difficulty getting air into our out of lungs, cough, and wheezing. Triggers are reported as exercise (movement pt reports), and voice use. Pt did not have coughing in today's session.    Remaining deficits: Assumed that deficits remain   Education / Equipment: See therapy note from 09/15/24   Patient agrees to discharge. Patient goals were not met. Patient is being discharged due to not returning since the last visit.SABRA    Karolyn Messing, CCC-SLP 11/01/2024, 2:21 PM  [place] [facility] [address], [address] [place], [place], [address] Phone: [phone]   Fax:  [phone]

## 2024-11-01 NOTE — Progress Notes (Signed)
 [name] is here today for follow up post radiation to the lung.  Lung Side:  Right, patient completed treatment on 12/11/21. Patient is here to receive CT results ABD/Pelvis  Does the patient complain of any of the following: Pain:No Shortness of breath w/wo exertion: Yes, mostly on exertion.  Cough: No, productive Hemoptysis: No Pain with swallowing: No Swallowing/choking concerns: No Appetite: Good Energy Level: Low Post radiation skin Changes: No Nausea/ Vomiting: No     Additional comments if applicable:

## 2024-11-01 NOTE — Telephone Encounter (Signed)
 Completed refill form with signature for Ozempic  faxed to [facility]

## 2024-11-02 ENCOUNTER — Ambulatory Visit

## 2024-11-03 ENCOUNTER — Encounter: Payer: Self-pay | Admitting: Nurse Practitioner

## 2024-11-03 ENCOUNTER — Telehealth: Payer: Self-pay | Admitting: *Deleted

## 2024-11-03 NOTE — Telephone Encounter (Signed)
 Called patient to inform of Pet Scan for 11-05-24- arrival time- 4:30 pm @ [facility], patient to have water only- 6 hrs. prior to scan, spoke with patient and she is aware of this scan and the instructions

## 2024-11-04 ENCOUNTER — Other Ambulatory Visit: Payer: Self-pay | Admitting: Nurse Practitioner

## 2024-11-04 ENCOUNTER — Ambulatory Visit

## 2024-11-04 DIAGNOSIS — M542 Cervicalgia: Secondary | ICD-10-CM

## 2024-11-04 MED ORDER — METHOCARBAMOL 500 MG PO TABS: 500.0000 mg | ORAL_TABLET | Freq: Three times a day (TID) | ORAL | 0 refills | Status: AC | PRN

## 2024-11-04 MED ORDER — OXYCODONE-ACETAMINOPHEN 10-325 MG PO TABS: 1.0000 | ORAL_TABLET | Freq: Four times a day (QID) | ORAL | 0 refills | Status: AC | PRN

## 2024-11-05 ENCOUNTER — Encounter (HOSPITAL_COMMUNITY)
Admission: RE | Admit: 2024-11-05 | Discharge: 2024-11-05 | Disposition: A | Discharge status: Home / Self Care | Source: Ambulatory Visit | Attending: Radiology | Admitting: Radiology

## 2024-11-05 ENCOUNTER — Institutional Professional Consult (permissible substitution) (INDEPENDENT_AMBULATORY_CARE_PROVIDER_SITE_OTHER): Admitting: Otolaryngology

## 2024-11-05 DIAGNOSIS — R590 Localized enlarged lymph nodes: Secondary | ICD-10-CM | POA: Diagnosis not present

## 2024-11-05 DIAGNOSIS — C3490 Malignant neoplasm of unspecified part of unspecified bronchus or lung: Secondary | ICD-10-CM | POA: Diagnosis not present

## 2024-11-05 DIAGNOSIS — C3491 Malignant neoplasm of unspecified part of right bronchus or lung: Secondary | ICD-10-CM | POA: Insufficient documentation

## 2024-11-05 LAB — GLUCOSE, CAPILLARY: Glucose-Capillary: 87 mg/dL (ref 70–99)

## 2024-11-05 MED ORDER — FLUDEOXYGLUCOSE F - 18 (FDG) INJECTION
8.7900 | Freq: Once | INTRAVENOUS | Status: AC
  Administered 2024-11-05: 8.79 via INTRAVENOUS

## 2024-11-08 ENCOUNTER — Encounter: Payer: Self-pay | Admitting: Internal Medicine

## 2024-11-08 ENCOUNTER — Ambulatory Visit (INDEPENDENT_AMBULATORY_CARE_PROVIDER_SITE_OTHER): Payer: Self-pay | Admitting: Internal Medicine

## 2024-11-08 VITALS — BP 110/74 | HR 85 | Temp 98.3°F | Ht 65.0 in | Wt 176.0 lb

## 2024-11-08 DIAGNOSIS — I700 Atherosclerosis of aorta: Secondary | ICD-10-CM | POA: Diagnosis not present

## 2024-11-08 DIAGNOSIS — J449 Chronic obstructive pulmonary disease, unspecified: Secondary | ICD-10-CM | POA: Diagnosis not present

## 2024-11-08 DIAGNOSIS — I5032 Chronic diastolic (congestive) heart failure: Secondary | ICD-10-CM

## 2024-11-08 DIAGNOSIS — E1169 Type 2 diabetes mellitus with other specified complication: Secondary | ICD-10-CM | POA: Diagnosis not present

## 2024-11-08 DIAGNOSIS — G8929 Other chronic pain: Secondary | ICD-10-CM

## 2024-11-08 DIAGNOSIS — C799 Secondary malignant neoplasm of unspecified site: Secondary | ICD-10-CM

## 2024-11-08 DIAGNOSIS — I110 Hypertensive heart disease with heart failure: Secondary | ICD-10-CM | POA: Diagnosis not present

## 2024-11-08 DIAGNOSIS — E785 Hyperlipidemia, unspecified: Secondary | ICD-10-CM

## 2024-11-08 DIAGNOSIS — C3491 Malignant neoplasm of unspecified part of right bronchus or lung: Secondary | ICD-10-CM

## 2024-11-08 MED ORDER — PREGABALIN 100 MG PO CAPS
100.0000 mg | ORAL_CAPSULE | Freq: Three times a day (TID) | ORAL | 2 refills | Status: AC
Start: 2024-11-08 — End: ?

## 2024-11-08 NOTE — Patient Instructions (Signed)

## 2024-11-08 NOTE — Progress Notes (Signed)
 I,[name], CMA,acting as a neurosurgeon for [name], MD.,have documented all relevant documentation on the behalf of [name], MD,as directed by  [name], MD while in the presence of [name], MD.  Subjective:  Patient ID: [name] , female    DOB: 07/17/1946 , 78 y.o.   MRN: [id number]  Chief Complaint  Patient presents with   Diabetes    Patient presents today for dm & bpc. She reports compliance with medications. Denies headache, chest pain & sob.    Hypertension    HPI Discussed the use of AI scribe software for clinical note transcription with the patient, who gave verbal consent to proceed.  History of Present Illness [name] is a 78 year old female with diabetes and hypertension who presents for diabetes check.    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 A CT scan revealed additional lesions in the liver and pancreas. She is scheduled to see an oncologist on Wednesday.  She experiences low energy levels and has been using her nebulizer more frequently, almost daily, due to breathing difficulties. Her previous medication, Breztri , was changed to a regimen including formoterol  and budesonide , which she administers twice daily. She finds the treatment time-consuming but notes some improvement in her breathing after use.  She reports fluctuating appetite, sometimes going a whole day without eating, and attributes her dinner consumption to cooking for her mother-in-law. Despite this, she is gaining weight. She also experiences morning stomachaches around her belly button, though they are manageable.  Her current medications include Ozempic , atorvastatin , bisoprolol , duloxetine , montelukast , and Protonix . She has not been taking Lyrica  since August due to a pharmacy issue, despite having a prescription with refills. She experiences neck pain and cramps. No significant changes in blood sugar  levels, with a recent reading of 87.   Diabetes She presents for her follow-up diabetic visit. She has type 2 diabetes mellitus. Her disease course has been stable. Pertinent negatives for hypoglycemia include no dizziness or headaches. Pertinent negatives for diabetes include no blurred vision, no polydipsia, no polyphagia and no polyuria. There are no hypoglycemic complications. Risk factors for coronary artery disease include diabetes mellitus, dyslipidemia, hypertension, obesity, sedentary lifestyle and post-menopausal. She has not had a previous visit with a dietitian. She participates in exercise three times a week. Her breakfast blood glucose is taken between 8-9 am. Her breakfast blood glucose range is generally 110-130 mg/dl. Eye exam is current.  Hypertension This is a chronic problem. The current episode started more than 1 year ago. The problem has been gradually improving since onset. The problem is controlled. Pertinent negatives include no blurred vision or headaches. The current treatment provides moderate improvement. Compliance problems include exercise.      Past Medical History:  Diagnosis Date   Allergy  09/30/1996   Arthritis    Asthma    Complication of anesthesia    COPD (chronic obstructive pulmonary disease) (HCC)    Diabetes (HCC)    High cholesterol    History of radiation therapy    Right Lung- 12/04/21-12/11/21- Dr. Lynwood Nasuti   Hypertension    Lung cancer [facility])    Neuromuscular disorder (HCC)    Pneumonia 09/10/2017   PONV (postoperative nausea and vomiting)    Stroke [facility]) 2007   mini strokes     Family History  Problem Relation Age of Onset   Heart murmur Mother    Heart  attack Father    Diabetes Father    Diabetes Brother    Stroke Brother    Vision loss Brother    Vision loss Brother      Current Outpatient Medications:    albuterol  (VENTOLIN  HFA) 108 (90 Base) MCG/ACT inhaler, Inhale 2 puffs into the lungs every 4 (four) hours as needed for  wheezing or shortness of breath., Disp: 18 g, Rfl: 12   arformoterol  (BROVANA ) 15 MCG/2ML NEBU, Take 2 mLs (15 mcg total) by nebulization 2 (two) times daily., Disp: 120 mL, Rfl: 12   aspirin  81 MG tablet, Take 81 mg by mouth daily., Disp: , Rfl:    atorvastatin  (LIPITOR) 80 MG tablet, TAKE 1 TABLET BY MOUTH EVERY DAY MONDAY THROUGH FRIDAY, Disp: 90 tablet, Rfl: 1   Biotin 10000 MCG TBDP, Take 10,000 mcg by mouth daily., Disp: , Rfl:    bisoprolol  (ZEBETA ) 5 MG tablet, TAKE 1/2 TABLET BY MOUTH EVERY DAY, Disp: 45 tablet, Rfl: 3   budesonide  (PULMICORT ) 0.5 MG/2ML nebulizer solution, Take 2 mLs (0.5 mg total) by nebulization 2 (two) times daily., Disp: 120 mL, Rfl: 12   CALCIUM -MAGNESIUM -ZINC PO, Take 1 capsule by mouth at bedtime., Disp: , Rfl:    Cholecalciferol (VITAMIN D3) 2000 UNITS TABS, Take 2,000 Units by mouth at bedtime., Disp: , Rfl:    DULoxetine  (CYMBALTA ) 60 MG capsule, TAKE 1 CAPSULE(60 MG) BY MOUTH DAILY, Disp: 90 capsule, Rfl: 1   famotidine  (PEPCID ) 20 MG tablet, Take 1 tablet (20 mg total) by mouth daily after supper., Disp: 90 tablet, Rfl: 3   ipratropium-albuterol  (DUONEB) 0.5-2.5 (3) MG/3ML SOLN, Take 3 mLs by nebulization every 4 (four) hours as needed., Disp: 360 mL, Rfl: 0   Magnesium  250 MG TABS, Take 1 tablet by mouth daily., Disp: , Rfl:    methocarbamol  (ROBAXIN ) 500 MG tablet, Take 1 tablet (500 mg total) by mouth every 8 (eight) hours as needed for muscle spasms., Disp: 30 tablet, Rfl: 0   montelukast  (SINGULAIR ) 10 MG tablet, TAKE 1 TABLET(10 MG) BY MOUTH AT BEDTIME, Disp: 90 tablet, Rfl: 3   oxyCODONE -acetaminophen  (PERCOCET) 10-325 MG tablet, Take 1 tablet by mouth every 6 (six) hours as needed for pain., Disp: 30 tablet, Rfl: 0   pantoprazole  (PROTONIX ) 40 MG tablet, TAKE 1 TABLET BY MOUTH 30 MINUTES BEFORE FIRST MEAL OF THE DAY, Disp: 90 tablet, Rfl: 2   predniSONE  (DELTASONE ) 10 MG tablet, 2 each am until better then 1 each am x 5 days and stop (Patient taking  differently: 10 mg. Patient reports taking as needed.), Disp: 100 tablet, Rfl: 2   benzonatate  (TESSALON ) 200 MG capsule, Take 1 capsule (200 mg total) by mouth 3 (three) times daily., Disp: 20 capsule, Rfl: 0   pregabalin  (LYRICA ) 100 MG capsule, Take 1 capsule (100 mg total) by mouth 3 (three) times daily. TAKE 1 CAPSULE(100 MG) BY MOUTH THREE TIMES DAILY, Disp: 90 capsule, Rfl: 2   promethazine  (PHENERGAN ) 25 MG tablet, Take 25 mg by mouth every 6 (six) hours as needed for nausea or vomiting., Disp: , Rfl:    REPATHA  SURECLICK 140 MG/ML SOAJ, ADMINISTER 1 ML UNDER THE SKIN EVERY 14 DAYS, Disp: 6 mL, Rfl: 3   Semaglutide , 1 MG/DOSE, (OZEMPIC , 1 MG/DOSE,) 4 MG/3ML SOPN, Inject 1 mg into the skin once a week. Friday, Disp: 3 mL, Rfl: 3   Allergies  Allergen Reactions   Other Swelling    HAIR DYE     Review of Systems  Constitutional:  Negative.   Eyes:  Negative for blurred vision.  Respiratory: Negative.    Cardiovascular: Negative.   Gastrointestinal: Negative.   Endocrine: Negative for polydipsia, polyphagia and polyuria.  Neurological:  Negative for dizziness and headaches.  Psychiatric/Behavioral: Negative.       Today's Vitals   11/08/24 1434  BP: 110/74  Pulse: 85  Temp: 98.3 F (36.8 C)  SpO2: 98%  Weight: 176 lb (79.8 kg)  Height: 5' 5 (1.651 m)   Body mass index is 29.29 kg/m.  Wt Readings from Last 3 Encounters:  11/10/24 171 lb (77.6 kg)  11/08/24 176 lb (79.8 kg)  11/01/24 (P) 177 lb 4 oz (80.4 kg)     Objective:  Physical Exam Vitals and nursing note reviewed.  Constitutional:      Appearance: Normal appearance.  HENT:     Head: Normocephalic and atraumatic.  Eyes:     Extraocular Movements: Extraocular movements intact.  Cardiovascular:     Rate and Rhythm: Normal rate and regular rhythm.     Heart sounds: Normal heart sounds.  Pulmonary:     Effort: Pulmonary effort is normal.     Breath sounds: Normal breath sounds.  Musculoskeletal:      Cervical back: Normal range of motion.  Skin:    General: Skin is warm.  Neurological:     General: No focal deficit present.     Mental Status: She is alert.  Psychiatric:        Mood and Affect: Mood normal.        Behavior: Behavior normal.         Assessment And Plan:  Dyslipidemia associated with type 2 diabetes mellitus (HCC) Assessment & Plan: Blood sugar level was 87 mg/dL, within normal range. Awaiting oncology evaluation to determine if Ozempic  should be discontinued due to potential pancreatic lesion. - Held Ozempic  pending oncology evaluation. - Ordered A1c test.  Orders: -     Lipid panel -     Hemoglobin A1c  Hypertensive heart disease with chronic diastolic congestive heart failure (HCC) Assessment & Plan: Chronic, well controlled.  She will continue with bisoprolol  5mg  1/2 tablet daily.  - Follow low sodium diet.    Atherosclerosis of aorta Assessment & Plan: Chronic, currently on statin therapy. She will continue with atorvastatin  80mg  M-F and ASA 81mg  daily for now.   Metastatic adenocarcinoma (HCC) Assessment & Plan: Metastatic adenocarcinoma (lung vs pancreas vs unknown primary) Biopsy confirmed adenocarcinoma in the jaw. CT scan showed liver and pancreatic lesions. Awaiting oncology evaluation to determine if lesions are metastatic from lung cancer or primary pancreatic cancer. - Await oncology evaluation on Wednesday   Adenocarcinoma, lung, right [facility]) Assessment & Plan: Chronic, she is s/p XRT.  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    COPD GOLD II  Assessment & Plan: Increased use of nebulizer due to recent medication change from Breztri  to a combination of fomoterol and budesonide . Reports inconvenience with current regimen but notes some improvement in symptoms.   Other chronic pain Assessment & Plan: Reports side pain and cramps. Previously prescribed Lyrica , but refill issues have prevented use. Plans to restart Lyrica  at  night to assess effectiveness. - Refilled Lyrica  with instructions to start at night, increase to twice daily if needed.   Other orders -     Pregabalin ; Take 1 capsule (100 mg total) by mouth 3 (three) times daily. TAKE 1 CAPSULE(100 MG) BY MOUTH THREE TIMES DAILY  Dispense: 90 capsule; Refill: 2  Return if symptoms worsen or fail to improve.  Patient was given opportunity to ask questions. Patient verbalized understanding of the plan and was able to repeat key elements of the plan. All questions were answered to their satisfaction.   I, [name], MD, have reviewed all documentation for this visit. The documentation on 11/08/24 for the exam, diagnosis, procedures, and orders are all accurate and complete.   IF YOU HAVE BEEN REFERRED TO A SPECIALIST, IT MAY TAKE 1-2 WEEKS TO SCHEDULE/PROCESS THE REFERRAL. IF YOU HAVE NOT HEARD FROM US /SPECIALIST IN TWO WEEKS, PLEASE GIVE US  A CALL AT [phone].   THE PATIENT IS ENCOURAGED TO PRACTICE SOCIAL DISTANCING DUE TO THE COVID-19 PANDEMIC.

## 2024-11-08 NOTE — Assessment & Plan Note (Signed)
Chronic, currently on statin therapy. She will continue with atorvastatin 80mg  M-F and ASA 81mg  daily for now.

## 2024-11-09 ENCOUNTER — Other Ambulatory Visit: Payer: Self-pay | Admitting: *Deleted

## 2024-11-09 ENCOUNTER — Encounter

## 2024-11-09 DIAGNOSIS — C3491 Malignant neoplasm of unspecified part of right bronchus or lung: Secondary | ICD-10-CM

## 2024-11-09 LAB — HEMOGLOBIN A1C
Est. average glucose Bld gHb Est-mCnc: 146 mg/dL
Hgb A1c MFr Bld: 6.7 % — ABNORMAL HIGH (ref 4.8–5.6)

## 2024-11-09 LAB — LIPID PANEL
Chol/HDL Ratio: 1.7 ratio (ref 0.0–4.4)
Cholesterol, Total: 101 mg/dL (ref 100–199)
HDL: 61 mg/dL (ref >39)
LDL Chol Calc (NIH): 24 mg/dL (ref 0–99)
Triglycerides: 82 mg/dL (ref 0–149)
VLDL Cholesterol Cal: 16 mg/dL (ref 5–40)

## 2024-11-10 ENCOUNTER — Inpatient Hospital Stay

## 2024-11-10 ENCOUNTER — Other Ambulatory Visit: Payer: Self-pay

## 2024-11-10 ENCOUNTER — Inpatient Hospital Stay: Attending: Internal Medicine | Admitting: Internal Medicine

## 2024-11-10 ENCOUNTER — Ambulatory Visit: Payer: Self-pay | Admitting: Internal Medicine

## 2024-11-10 VITALS — BP 132/68 | HR 88 | Temp 98.0°F | Resp 17 | Ht 65.0 in | Wt 171.0 lb

## 2024-11-10 DIAGNOSIS — C770 Secondary and unspecified malignant neoplasm of lymph nodes of head, face and neck: Secondary | ICD-10-CM | POA: Diagnosis not present

## 2024-11-10 DIAGNOSIS — C3490 Malignant neoplasm of unspecified part of unspecified bronchus or lung: Secondary | ICD-10-CM

## 2024-11-10 DIAGNOSIS — C7989 Secondary malignant neoplasm of other specified sites: Secondary | ICD-10-CM | POA: Diagnosis not present

## 2024-11-10 DIAGNOSIS — C3411 Malignant neoplasm of upper lobe, right bronchus or lung: Secondary | ICD-10-CM | POA: Diagnosis not present

## 2024-11-10 DIAGNOSIS — C787 Secondary malignant neoplasm of liver and intrahepatic bile duct: Secondary | ICD-10-CM | POA: Insufficient documentation

## 2024-11-10 DIAGNOSIS — C7889 Secondary malignant neoplasm of other digestive organs: Secondary | ICD-10-CM | POA: Diagnosis not present

## 2024-11-10 DIAGNOSIS — C801 Malignant (primary) neoplasm, unspecified: Secondary | ICD-10-CM | POA: Insufficient documentation

## 2024-11-10 DIAGNOSIS — C3491 Malignant neoplasm of unspecified part of right bronchus or lung: Secondary | ICD-10-CM

## 2024-11-10 DIAGNOSIS — F17290 Nicotine dependence, other tobacco product, uncomplicated: Secondary | ICD-10-CM | POA: Diagnosis not present

## 2024-11-10 LAB — CBC WITH DIFFERENTIAL (CANCER CENTER ONLY)
Abs Immature Granulocytes: 0.02 K/uL (ref 0.00–0.07)
Basophils Absolute: 0 K/uL (ref 0.0–0.1)
Basophils Relative: 1 %
Eosinophils Absolute: 0.1 K/uL (ref 0.0–0.5)
Eosinophils Relative: 1 %
HCT: 34.8 % — ABNORMAL LOW (ref 36.0–46.0)
Hemoglobin: 11.1 g/dL — ABNORMAL LOW (ref 12.0–15.0)
Immature Granulocytes: 0 %
Lymphocytes Relative: 30 %
Lymphs Abs: 1.9 K/uL (ref 0.7–4.0)
MCH: 25.8 pg — ABNORMAL LOW (ref 26.0–34.0)
MCHC: 31.9 g/dL (ref 30.0–36.0)
MCV: 80.9 fL (ref 80.0–100.0)
Monocytes Absolute: 0.5 K/uL (ref 0.1–1.0)
Monocytes Relative: 8 %
Neutro Abs: 4 K/uL (ref 1.7–7.7)
Neutrophils Relative %: 60 %
Platelet Count: 491 K/uL — ABNORMAL HIGH (ref 150–400)
RBC: 4.3 MIL/uL (ref 3.87–5.11)
RDW: 16.8 % — ABNORMAL HIGH (ref 11.5–15.5)
WBC Count: 6.6 K/uL (ref 4.0–10.5)
nRBC: 0 % (ref 0.0–0.2)

## 2024-11-10 LAB — CMP (CANCER CENTER ONLY)
ALT: 13 U/L (ref 0–44)
AST: 22 U/L (ref 15–41)
Albumin: 3.8 g/dL (ref 3.5–5.0)
Alkaline Phosphatase: 68 U/L (ref 38–126)
Anion gap: 5 (ref 5–15)
BUN: 7 mg/dL — ABNORMAL LOW (ref 8–23)
CO2: 30 mmol/L (ref 22–32)
Calcium: 9.4 mg/dL (ref 8.9–10.3)
Chloride: 105 mmol/L (ref 98–111)
Creatinine: 0.71 mg/dL (ref 0.44–1.00)
GFR, Estimated: >60 mL/min (ref >60)
Glucose, Bld: 96 mg/dL (ref 70–99)
Potassium: 4.2 mmol/L (ref 3.5–5.1)
Sodium: 140 mmol/L (ref 135–145)
Total Bilirubin: 0.4 mg/dL (ref 0.0–1.2)
Total Protein: 7.2 g/dL (ref 6.5–8.1)

## 2024-11-10 LAB — MISCELLANEOUS TEST

## 2024-11-10 NOTE — Progress Notes (Signed)
 NN met with pt today at her consult with Dr Sherrod. Pt accompanied by her dtr.  Plan for pt is additional staining on her tissue.  Pt will need a brain scan to complete staging work up.  Discussed smoking cessation strategies with the pt and her dtr.  NN provided pt with a card with direct contact information.  Pt informed about [facility] she can have drawn in addition to testing her tissue for tumor markers that can assist with her treatment. Pt verbalizes understanding and is amenable to another blood draw.  No questions or concerns at the conclusion of the pts appt.   NN emailed Sherley Rubin,  [place] path tech, requesting pts tissue be sent to [facility] for molecular testing with MET and PD-L1. [facility] states that the tissue will likely go out on Friday 11/14 since additional staining is needing to be done. NN verbalized understanding.

## 2024-11-10 NOTE — Progress Notes (Signed)
 [name] [phone]   [phone]  CONSULT NOTE  REFERRING PHYSICIAN: Dr. Catheryn Slocumb  REASON FOR CONSULTATION:  78 years old African-American female diagnosed with lung cancer  HPI [name] is a 78 y.o. 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 Shannon.  The patient was followed by observation after that time.  In June 2025 she was complaining of shortness of breath and cough.  She had CT angiogram of the chest on June 28, 2024.  It was negative for acute pulmonary embolism and there was posttreatment scarring in the suprahilar posterior right lung that was not changed.  There was similar soft tissue fullness in the upper right hilar region with inferior index right hilar lymph node measuring 1.3 cm.  There was also new 0.3 cm nodule in the anterior left upper lobe and a stable 0.5 cm perifissural nodule in the right middle lobe.  In September 2025 the patient had left jaw pain and ultrasound of the soft tissue of the head and neck was performed 09/16/2024 and it showed 2 adjacent abnormal hypoechoic soft tissue lesions and the differential include pathologic lymphadenopathy versus benign salivary neoplasm.  She had CT scan of the chest without contrast on October 12, 2024 and showed stable appearance of the posttreatment scarring in the suprahilar right lung with a stable 0.5 cm posterior right middle lobe pulmonary nodule.  There was 3.7 x 3.7 cm ill-defined low-density lesion in the dome of the left liver which enlarged compared to June 2025 and new since the previous CT scan February 09, 2024.  MRI was recommended.  On October 28, 2024 the patient underwent ultrasound-guided core biopsy of the large  left mandibular neck mass by interventional radiology and the final pathology ([id number]) was consistent with adenocarcinoma.  CT of the abdomen and pelvis with contrast on October 29, 2024 showed 3.6 cm hypoenhancing mass in segment 2 of the liver suspicious for malignancy.  There was also 2.3 cm hypoenhancing mass/lesion at the level of the tail of the pancreas concerning for metastatic lesion or pancreatic adenocarcinoma.  A PET scan was performed on November 05, 2024 and that showed hypermetabolic right hilar lymph node compatible with active malignancy.  There was also hypermetabolic segment 2 mass in the liver compatible with malignancy and likely metastatic.  There was hypermetabolic mass in the tail of the pancreas adjacent to the lower splenic hilum potentially metastatic lesion or primary pancreatic malignancy.  There was also hypermetabolic lesion at the junction of the anterior left parotid and left masseter muscle most likely involving the parotid gland suspicious for malignancy.  The scan also showed a smaller bilateral posterior mildly hypermetabolic parotid nodules.  There was focally accentuated activity inferiorly in the thigh rectus abdominous muscle just above the pubis and also a metastatic lesion could not be completely excluded.  The scan also showed a small focus of exenterated activity along the margin of the left bicep and proximal brachialis musculature of the mid 11 with maximum SUV of 5.2.  There was faint focal activity in the right gluteus maximum muscle without CT correlate and high activity along the tip of the tongue with maximum SUV of 13.9 probably physiologic. The patient was referred to me today for evaluation and recommendation regarding treatment  of her condition. HPI  Discussed the use of AI scribe software for clinical note transcription with the patient, who gave verbal consent to proceed.  History of Present Illness [name] is a 78 year old female with  metastatic adenocarcinoma who presents with multiple metastatic lesions. She is accompanied by her daughter, [name], who is also a PA.  In 2022, she was diagnosed with early-stage adenocarcinoma in the right upper lobe of the lung, treated with three sessions of radiation. Follow-up CT scans every six months showed resolution of the initial nodule.  In June 2025, she experienced breathing issues, and a CT angiogram of the chest showed no blood clots but revealed tiny nodules in the lung. By September 2025, she noticed a lump on the left side of her neck under the mandible, confirmed as a mass via ultrasound. A biopsy of the left submandibular lymph node confirmed adenocarcinoma.  A CT scan of the chest in October 2025 showed a lesion in the liver and a mass in the tail of the pancreas. A PET scan revealed additional lymph node involvement in the right hilar region, liver, pancreas, neck, and several muscle sites including the gluteal and abdominal muscles.  She experiences significant fatigue, breathing difficulties requiring a nebulizer, and pain under the left lower ribcage exacerbated by certain movements. No abdominal pain, significant weight loss, nausea, vomiting, diarrhea, chest pain, or hemoptysis, but she has a persistent cough and occasional nausea.  Her past medical history includes allergies, asthma, diabetes, high blood pressure, COPD, and a history of strokes in 2007. She denies any history of heart attacks.  Family history is significant for heart disease in her mother and diabetes and heart disease in her father, who died of a massive heart attack. There is no family history of cancer.  Social history reveals a long history of smoking for over 40 years, with intermittent cessation, and she currently struggles to quit. She denies alcohol and drug use. 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     Past Medical History:  Diagnosis Date   Allergy  09/30/1996    Arthritis    Asthma    Complication of anesthesia    COPD (chronic obstructive pulmonary disease) (HCC)    Diabetes (HCC)    High cholesterol    History of radiation therapy    Right Lung- 12/04/21-12/11/21- Dr. Lynwood Nasuti   Hypertension    Lung cancer [facility])    Neuromuscular disorder (HCC)    Pneumonia 09/10/2017   PONV (postoperative nausea and vomiting)    Stroke (HCC) 2007   mini strokes      Past Surgical History:  Procedure Laterality Date   ABDOMINAL HYSTERECTOMY  1995   BRONCHIAL BIOPSY  10/23/2021   Procedure: BRONCHIAL BIOPSIES;  Surgeon: Brenna Adine CROME, DO;  Location: [facility];  Service: Pulmonary;;   BRONCHIAL BRUSHINGS  10/23/2021   Procedure: BRONCHIAL BRUSHINGS;  Surgeon: Brenna Adine CROME, DO;  Location: [facility];  Service: Pulmonary;;   BRONCHIAL NEEDLE ASPIRATION BIOPSY  10/23/2021   Procedure: BRONCHIAL NEEDLE ASPIRATION BIOPSIES;  Surgeon: Brenna Adine CROME, DO;  Location: [facility];  Service: Pulmonary;;   carpel tunnel surgery Right 2006   COLONOSCOPY  2014   COLOSTOMY  2007   colostomy let down  2008   endoscopy  2014   FIDUCIAL MARKER PLACEMENT  10/23/2021   Procedure: FIDUCIAL MARKER PLACEMENT;  Surgeon: Brenna Adine CROME, DO;  Location: [facility];  Service: Pulmonary;;  rotator cuff tear Left 04/14/2020   TUBAL LIGATION     VIDEO BRONCHOSCOPY WITH ENDOBRONCHIAL NAVIGATION Right 10/23/2021   Procedure: VIDEO BRONCHOSCOPY WITH ENDOBRONCHIAL NAVIGATION;  Surgeon: Brenna Adine CROME, DO;  Location: [facility];  Service: Pulmonary;  Laterality: Right;  ION, w/ fiducial placement   VIDEO BRONCHOSCOPY WITH RADIAL ENDOBRONCHIAL ULTRASOUND  10/23/2021   Procedure: VIDEO BRONCHOSCOPY WITH RADIAL ENDOBRONCHIAL ULTRASOUND;  Surgeon: Brenna Adine CROME, DO;  Location: [facility];  Service: Pulmonary;;    Family History  Problem Relation Age of Onset   Heart murmur Mother    Heart attack Father    Diabetes Father    Diabetes Brother    Stroke  Brother    Vision loss Brother    Vision loss Brother     Social History Social History   Tobacco Use   Smoking status: Former    Current packs/day: 0.00    Average packs/day: 0.5 packs/day for 42.0 years (21.0 ttl pk-yrs)    Types: Cigarettes    Start date: 01/30/1967    Quit date: 01/30/2006    Years since quitting: 18.7   Smokeless tobacco: Never   Tobacco comments:    intermittent cycles b/w 1/4-1/2ppd  Vaping Use   Vaping status: Never Used  Substance Use Topics   Alcohol use: Not Currently    Comment: 1 drink a year, maybe   Drug use: No    Allergies  Allergen Reactions   Other Swelling    HAIR DYE    Current Outpatient Medications  Medication Sig Dispense Refill   albuterol  (VENTOLIN  HFA) 108 (90 Base) MCG/ACT inhaler Inhale 2 puffs into the lungs every 4 (four) hours as needed for wheezing or shortness of breath. 18 g 12   arformoterol  (BROVANA ) 15 MCG/2ML NEBU Take 2 mLs (15 mcg total) by nebulization 2 (two) times daily. 120 mL 12   aspirin  81 MG tablet Take 81 mg by mouth daily.     atorvastatin  (LIPITOR) 80 MG tablet TAKE 1 TABLET BY MOUTH EVERY DAY MONDAY THROUGH FRIDAY 90 tablet 1   benzonatate  (TESSALON ) 200 MG capsule Take 1 capsule (200 mg total) by mouth 3 (three) times daily. (Patient not taking: Reported on 11/08/2024) 20 capsule 0   Biotin 89999 MCG TBDP Take 10,000 mcg by mouth daily.     bisoprolol  (ZEBETA ) 5 MG tablet TAKE 1/2 TABLET BY MOUTH EVERY DAY 45 tablet 3   budesonide  (PULMICORT ) 0.5 MG/2ML nebulizer solution Take 2 mLs (0.5 mg total) by nebulization 2 (two) times daily. 120 mL 12   CALCIUM -MAGNESIUM -ZINC PO Take 1 capsule by mouth at bedtime.     Cholecalciferol (VITAMIN D3) 2000 UNITS TABS Take 2,000 Units by mouth at bedtime.     DULoxetine  (CYMBALTA ) 60 MG capsule TAKE 1 CAPSULE(60 MG) BY MOUTH DAILY 90 capsule 1   famotidine  (PEPCID ) 20 MG tablet Take 1 tablet (20 mg total) by mouth daily after supper. 90 tablet 3    ipratropium-albuterol  (DUONEB) 0.5-2.5 (3) MG/3ML SOLN Take 3 mLs by nebulization every 4 (four) hours as needed. 360 mL 0   Magnesium  250 MG TABS Take 1 tablet by mouth daily.     methocarbamol  (ROBAXIN ) 500 MG tablet Take 1 tablet (500 mg total) by mouth every 8 (eight) hours as needed for muscle spasms. 30 tablet 0   montelukast  (SINGULAIR ) 10 MG tablet TAKE 1 TABLET(10 MG) BY MOUTH AT BEDTIME 90 tablet 3   oxyCODONE -acetaminophen  (PERCOCET) 10-325 MG tablet Take 1 tablet by mouth every 6 (  six) hours as needed for pain. 30 tablet 0   pantoprazole  (PROTONIX ) 40 MG tablet TAKE 1 TABLET BY MOUTH 30 MINUTES BEFORE FIRST MEAL OF THE DAY 90 tablet 2   predniSONE  (DELTASONE ) 10 MG tablet 2 each am until better then 1 each am x 5 days and stop (Patient taking differently: 10 mg. Patient reports taking as needed.) 100 tablet 2   pregabalin  (LYRICA ) 100 MG capsule Take 1 capsule (100 mg total) by mouth 3 (three) times daily. TAKE 1 CAPSULE(100 MG) BY MOUTH THREE TIMES DAILY 90 capsule 2   promethazine  (PHENERGAN ) 25 MG tablet Take 25 mg by mouth every 6 (six) hours as needed for nausea or vomiting.     REPATHA  SURECLICK 140 MG/ML SOAJ ADMINISTER 1 ML UNDER THE SKIN EVERY 14 DAYS 6 mL 3   Semaglutide , 1 MG/DOSE, (OZEMPIC , 1 MG/DOSE,) 4 MG/3ML SOPN Inject 1 mg into the skin once a week. Friday 3 mL 3   No current facility-administered medications for this visit.    Review of Systems  Constitutional: positive for fatigue Eyes: negative Ears, nose, mouth, throat, and face: negative Respiratory: positive for cough and dyspnea on exertion Cardiovascular: negative Gastrointestinal: negative Genitourinary:negative Integument/breast: negative Hematologic/lymphatic: negative Musculoskeletal:negative Neurological: negative Behavioral/Psych: negative Endocrine: negative Allergic/Immunologic: negative  Physical Exam  MJO:jozmu, healthy, no distress, well nourished, and well developed SKIN: skin color,  texture, turgor are normal, no rashes or significant lesions HEAD: Normocephalic, No masses, lesions, tenderness or abnormalities EYES: normal, PERRLA, Conjunctiva are pink and non-injected EARS: External ears normal OROPHARYNX:no exudate, no erythema, and lips, buccal mucosa, and tongue normal  NECK: Palpable left carotid gland mass LYMPH:  moderate nontender anterior cervical nodes BREAST:not examined LUNGS: clear to auscultation , and palpation HEART: regular rate & rhythm, no murmurs, and no gallops ABDOMEN:abdomen soft, non-tender, normal bowel sounds, and no masses or organomegaly BACK: Back symmetric, no curvature., No CVA tenderness EXTREMITIES:no joint deformities, effusion, or inflammation, no edema  NEURO: alert & oriented x 3 with fluent speech, no focal motor/sensory deficits  PERFORMANCE STATUS: ECOG 1  LABORATORY DATA: Lab Results  Component Value Date   WBC 11.0 (H) 07/03/2024   HGB 12.2 07/03/2024   HCT 38.1 07/03/2024   MCV 89.6 07/03/2024   PLT 406 (H) 07/03/2024      Chemistry      Component Value Date/Time   NA 140 10/18/2024 1209   NA 139 05/31/2024 1240   K 3.8 10/18/2024 1209   CL 104 10/18/2024 1209   CO2 32 10/18/2024 1209   BUN 8 10/18/2024 1209   BUN 11 05/31/2024 1240   CREATININE 0.80 10/18/2024 1209   GLU 90 03/18/2022 0000      Component Value Date/Time   CALCIUM  9.5 10/18/2024 1209   ALKPHOS 63 10/18/2024 1209   AST 22 10/18/2024 1209   ALT 13 10/18/2024 1209   BILITOT 0.4 10/18/2024 1209       RADIOGRAPHIC STUDIES: NM PET Image Restag (PS) Skull Base To Thigh Result Date: 11/09/2024 CLINICAL DATA:  Subsequent treatment strategy for non-small cell lung cancer. EXAM: NUCLEAR MEDICINE PET SKULL BASE TO THIGH TECHNIQUE: 8.8 mCi F-18 FDG was injected intravenously. Full-ring PET imaging was performed from the skull base to thigh after the radiotracer. CT data was obtained and used for attenuation correction and anatomic localization.  Fasting blood glucose: 87 mg/dl COMPARISON:  Multiple exams, including 09/11/2021 and CT examinations from 10/29/2024 and 10/12/2024 FINDINGS: Mediastinal blood pool activity: SUV max 2.3 Liver activity: SUV  max NA NECK: Hypermetabolic 1.4 by 2.6 cm lesion at the junction of the anterior left parotid gland and left masseter muscle, more likely involving the parotid gland on image 28 series 4, maximum SUV 11.6, concerning for malignancy. Smaller lymph node along the posterior margin of the parotid gland 0.6 cm in short axis on image 29 series 4 with maximum SUV 8.3, likewise concerning for potential malignancy. Mild nodularity posteriorly along the right parotid gland with maximum SUV 5.0, nonspecific. High activity along the tip of the tongue, maximum SUV 13.9, probably physiologic given the location, although tissue planes are obscured by streak artifact from the patient's dental orthotic. Symmetric glottic activity, maximum SUV 8.9, likely physiologic. Incidental CT findings: Symmetric calcification of the globus pallidus nuclei in the brain, likely physiologic. Bilateral common carotid atheromatous vascular calcifications. CHEST: Right hilar lymph node 1.6 cm in short axis on image 68 series 4 with maximum SUV 11.7, compatible with active malignancy. Fiducial with adjacent posterior triangular consolidation in the right upper lobe just above the major fissure corresponding to site of previous treated tumor, maximum SUV in this triangular region is 3.3, and may reflect post treatment activity rather than necessarily a locally recurrent nodule at the original site of involvement. Incidental CT findings: Scarring in the left lower lobe. Coronary, aortic arch, and branch vessel atherosclerotic vascular disease. ABDOMEN/PELVIS: Segment 2 mass in the liver measures about 3.9 cm in diameter with maximum SUV 14.4, compatible with malignancy. Hypermetabolic mass in the tail the pancreas adjacent to the lower splenic hilum  with hypermetabolic activity measuring 2.2 cm in diameter with maximum SUV 10.9. This could be a metastatic lesion or a primary pancreatic malignancy. Focally accentuated activity inferiorly in the right rectus abdominus muscle just above the pubis with some mild associated asymmetric thickening of the muscle for example on image 176 of series 604, maximum SUV 7.4. Metastatic lesion not excluded. Small focus of accentuated metabolic activity in the right gluteus maximus muscle is likely incidental, metastatic lesion less likely, maximum SUV 3.2. Small focus of accentuated activity along the margin of the biceps and proximal brachialis musculature of the mid humeral level, maximum SUV 5.2, this could be vascular activity given the left antecubital injection site, and may simply be incidental. Incidental CT findings: Extensive atheromatous vascular disease including the abdominal aorta. Scattered colonic diverticulosis. Anastomotic staple line in the sigmoid colon. SKELETON: No significant abnormal hypermetabolic activity in this region. Incidental CT findings: Spondylosis. IMPRESSION: 1. Hypermetabolic right hilar lymph node compatible with active malignancy. The treatment site of the original primary lesion is marked by fiducial and only demonstrates low-grade activity which may be treatment related. 2. Hypermetabolic segment 2 mass in the liver compatible with malignancy (likely metastatic). 3. Hypermetabolic mass in the tail the pancreas adjacent to the lower splenic hilum, potentially a metastatic lesion or a primary pancreatic malignancy. 4. Hypermetabolic lesion at the junction of the anterior left parotid gland and left masseter muscle, more likely involving the parotid gland, suspicious for malignancy. Smaller bilateral posterior mildly hypermetabolic parotid nodules are also present. 5. Focally accentuated activity inferiorly in the right rectus abdominus muscle just above the pubis, maximum SUV 7.4.  Metastatic lesion not excluded. 6. Small focus of accentuated activity along the margin of the left biceps and proximal brachialis musculature of the mid humeral level, maximum SUV 5.2, this could be vascular activity given the left antecubital injection site, and may simply be incidental. 7. Faint focal activity in the right gluteus maximus muscle without  CT correlate, probably physiologic although may merit surveillance. 8. High activity along the tip of the tongue, maximum SUV 13.9, probably physiologic given the location, although tissue planes are obscured by streak artifact from the patient's dental orthotic. 9. Aortic Atherosclerosis (ICD10-I70.0). Notable systemic atherosclerosis. Electronically Signed   By: Ryan Salvage M.D.   On: 11/09/2024 10:54   CT ABDOMEN PELVIS W CONTRAST Result Date: 11/01/2024 CLINICAL DATA:  Non-small cell carcinoma the right lung restaging * Tracking Code: BO * EXAM: CT ABDOMEN AND PELVIS WITH CONTRAST TECHNIQUE: Multidetector CT imaging of the abdomen and pelvis was performed using the standard protocol following bolus administration of intravenous contrast. RADIATION DOSE REDUCTION: This exam was performed according to the departmental dose-optimization program which includes automated exposure control, adjustment of the mA and/or kV according to patient size and/or use of iterative reconstruction technique. CONTRAST:  OMNIPAQUE  IOHEXOL  300 MG/ML  SOLN COMPARISON:  Multiple exams, including 10/12/2024 and PET-CT 09/11/2021 FINDINGS: Lower chest: Scarring laterally in the left lower lobe. Descending thoracic aortic atherosclerotic vascular calcifications. Hepatobiliary: 3.6 by 2.7 by 3.6 cm mass in segment 2 of the liver on image 100 series 2, mixed internal density, concerning for malignancy. Pancreas: 2.3 by 1.6 by 1.6 cm hypoenhancing mass/lesion the tip of the tail the pancreas on image 21 series 2, pancreatic adenocarcinoma is not excluded. No significant  involvement of the splenic artery or vein at this time. No compelling findings of invasion of the adjacent kidney or spleen. Fatty lesion in the pancreatic tail on image 22 series 2, benign. Borderline dorsal pancreatic duct dilatation. Spleen: Unremarkable Adrenals/Urinary Tract: Benign-appearing fluid density 1.2 cm cyst of the right kidney upper pole warrants no further imaging workup. Stomach/Bowel: Anastomotic staple line in the upper rectum. Scattered diverticula of the ascending colon. Vascular/Lymphatic: Atherosclerosis is present, including aortoiliac atherosclerotic disease. Atheromatous plaque at the origins of the celiac trunk and SMA without occlusion. Reproductive: Uterus absent Other: No supplemental non-categorized findings. Musculoskeletal: Grade 1 degenerative anterolisthesis at L4-5 with mid and lower lumbar spondylosis and degenerative disc disease. IMPRESSION: 1. 3.6 cm hypoenhancing mass in segment 2 of the liver, suspicious for malignancy. 2. 2.3 cm hypoenhancing mass/lesion at the tip of the tail the pancreas, concerning for metastatic lesion or pancreatic adenocarcinoma. 3.  Aortic Atherosclerosis (ICD10-I70.0).  Systemic atherosclerosis. 4. Lower lumbar spondylosis and degenerative disc disease. Electronically Signed   By: Ryan Salvage M.D.   On: 11/01/2024 10:56   US  CORE BIOPSY (SOFT TISSUE) Result Date: 10/28/2024 INDICATION: mass of jaw, abnormal ultrasound EXAM: ULTRASOUND-GUIDED LEFT SUBMANDIBULAR MASS BIOPSY COMPARISON:  HEAD AND NECK ULTRASOUND, 09/16/2024. MEDICATIONS: None ANESTHESIA/SEDATION: Local anesthetic was administered. COMPLICATIONS: None immediate. TECHNIQUE: Informed written consent was obtained from the patient after a discussion of the risks, benefits and alternatives to treatment. Questions regarding the procedure were encouraged and answered. Initial ultrasound scanning demonstrated a dominant 2 cm subcutaneous mass at the LEFT submandibular neck. An  ultrasound image was saved for documentation purposes. The procedure was planned. A timeout was performed prior to the initiation of the procedure. The operative was prepped and draped in the usual sterile fashion, and a sterile drape was applied covering the operative field. A timeout was performed prior to the initiation of the procedure. Local anesthesia was provided with 1% lidocaine  with epinephrine. Under direct ultrasound guidance, an 18 gauge core needle device was utilized to obtain to obtain 3 core needle biopsies of the LEFT submandibular mass. The samples were placed in saline and submitted to pathology.  The needle was removed and superficial hemostasis was achieved with manual compression. Post procedure scan was negative for significant hematoma. A dressing was applied. The patient tolerated the procedure well without immediate postprocedural complication. IMPRESSION: Successful ultrasound guided biopsy of the larger LEFT submandibular neck mass. Thom Hall, MD [facility] Electronically Signed   By: Thom Hall M.D.   On: 10/28/2024 14:59   CT CHEST WO CONTRAST Result Date: 10/17/2024 CLINICAL DATA:  Non-small-cell lung cancer. Restaging. * Tracking Code: BO * EXAM: CT CHEST WITHOUT CONTRAST TECHNIQUE: Multidetector CT imaging of the chest was performed following the standard protocol without IV contrast. RADIATION DOSE REDUCTION: This exam was performed according to the departmental dose-optimization program which includes automated exposure control, adjustment of the mA and/or kV according to patient size and/or use of iterative reconstruction technique. COMPARISON:  06/28/2024 FINDINGS: Cardiovascular: The heart size is normal. No substantial pericardial effusion. Coronary artery calcification is evident. Moderate atherosclerotic calcification is noted in the wall of the thoracic aorta. Mediastinum/Nodes: No mediastinal  lymphadenopathy. No evidence for gross hilar lymphadenopathy although assessment is limited by the lack of intravenous contrast on the current study. The index right upper and lower right hilar nodes measured on the 06/28/2024 exam are not well demonstrated on today's noncontrast study. The esophagus has normal imaging features. There is no axillary lymphadenopathy. Lungs/Pleura: Centrilobular and paraseptal emphysema evident. No substantial change in appearance of post treatment scarring in the suprahilar right lung. 5 mm posterior right middle lobe pulmonary nodule measured previously is stable in the interval. The 3 mm new left upper lobe pulmonary nodule identified previously has resolved in the interval. No new suspicious pulmonary nodule or mass. No pleural effusion. Upper Abdomen: 3.7 x 3.7 cm ill-defined low-density lesion is identified in the dome of the left liver on image 133/2. This is discernible in retrospect measuring about 1.6 x 1.4 cm on the 06/28/2024 exam and new since the chest CT of 04/08/2024. Small cyst posterior right kidney is similar to priors. Musculoskeletal: No worrisome lytic or sclerotic osseous abnormality. IMPRESSION: 1. Stable appearance of post treatment scarring in the suprahilar right lung. 2. Stable 5 mm posterior right middle lobe pulmonary nodule. 3. The 3 mm new left upper lobe pulmonary nodule identified previously has resolved in the interval. 4. 3.7 x 3.7 cm ill-defined low-density lesion in the dome of the left liver. This is discernible in retrospect measuring about 1.6 x 1.4 cm on the 06/28/2024 exam and new since the chest CT of 04/08/2024. Dedicated abdomen pelvis CT with contrast or abdominal MRI with and without contrast recommended to further evaluate. 5. Aortic Atherosclerosis (ICD10-I70.0) and Emphysema (ICD10-J43.9). These results will be called to the ordering clinician or representative by the Radiologist Assistant, and communication documented in the PACS or  [employer]. Electronically Signed   By: Camellia Candle M.D.   On: 10/17/2024 08:15    ASSESSMENT: This is a very pleasant 78 years old African-American female with stage IV (TX, N2, M1 C) non-small cell lung cancer, adenocarcinoma presented with right hilar lymphadenopathy as well as left parotid gland metastasis in addition to liver, pancreatic tail and muscular metastasis diagnosed in October 2025.  Other primary could not be excluded at this point and awaiting further immunohistochemical stains. Molecular studies are still pending   PLAN: I had a lengthy discussion with the patient and her daughter today about her current disease stage, prognosis and treatment options.  I personally independently reviewed her imaging  studies and pathology report and discussed the result with the patient and her daughter. The patient understands that she has incurable condition and all the treatment will be of palliative nature for prolongation of her survival and palliation of her symptoms. Assessment and Plan Assessment & Plan Metastatic adenocarcinoma, likely lung primary, stage IV with multi-organ involvement Stage IV metastatic adenocarcinoma with multi-organ involvement, including liver, pancreas, and lymph nodes. Likely lung primary, but origin not confirmed. PET scan shows multiple lesions. Biopsy of submandibular lymph node confirmed adenocarcinoma. Differential includes lung, pancreas, or other primary sources. Treatment is palliative, aiming to improve quality of life and prolong survival. Surgery not an option due to extent of disease. Radiation may be considered for symptomatic relief. Molecular marker testing is pending to guide targeted therapy if applicable. If no marker is found, chemotherapy and immunotherapy will be considered. - Ordered molecular marker testing on tumor tissue and blood. - Ordered MRI of the brain to assess for metastasis. - Will schedule follow-up in two weeks to review  test results and discuss treatment options. - Will consider targeted therapy if molecular markers are present. - Will consider chemotherapy and immunotherapy if no markers are found.  Nicotine dependence, current smoker Current smoker with a 40-year history of smoking. Smoking cessation is crucial to improve treatment outcomes and overall health. She expresses willingness to quit but has struggled with cessation in the past. Smoking cessation is essential to enhance treatment efficacy and reduce further health risks. - Advised smoking cessation for the next two weeks. - Discussed potential use of nicotine lozenges as an aid to quit smoking. - Encouraged complete smoking cessation to improve treatment outcomes. She was advised to call immediately if she has any other concerning symptoms in the interval.    The patient voices understanding of current disease status and treatment options and is in agreement with the current care plan.  All questions were answered. The patient knows to call the clinic with any problems, questions or concerns. We can certainly see the patient much sooner if necessary.  Thank you so much for allowing me to participate in the care of [name]. I will continue to follow up the patient with you and assist in her care. The total time spent in the appointment was 90 minutes including review of chart and various tests results, discussions about plan of care and coordination of care plan .   Disclaimer: This note was dictated with voice recognition software. Similar sounding words can inadvertently be transcribed and may not be corrected upon review.   Sherrod MARLA Sherrod November 10, 2024, 11:08 AM

## 2024-11-12 LAB — SURGICAL PATHOLOGY

## 2024-11-14 ENCOUNTER — Encounter: Payer: Self-pay | Admitting: Internal Medicine

## 2024-11-14 DIAGNOSIS — C799 Secondary malignant neoplasm of unspecified site: Secondary | ICD-10-CM | POA: Insufficient documentation

## 2024-11-14 DIAGNOSIS — I110 Hypertensive heart disease with heart failure: Secondary | ICD-10-CM | POA: Insufficient documentation

## 2024-11-14 NOTE — Assessment & Plan Note (Addendum)
 Chronic, she is s/p XRT.  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

## 2024-11-14 NOTE — Assessment & Plan Note (Signed)
 Reports side pain and cramps. Previously prescribed Lyrica , but refill issues have prevented use. Plans to restart Lyrica  at night to assess effectiveness. - Refilled Lyrica  with instructions to start at night, increase to twice daily if needed.

## 2024-11-14 NOTE — Assessment & Plan Note (Signed)
 Metastatic adenocarcinoma (lung vs pancreas vs unknown primary) Biopsy confirmed adenocarcinoma in the jaw. CT scan showed liver and pancreatic lesions. Awaiting oncology evaluation to determine if lesions are metastatic from lung cancer or primary pancreatic cancer. - Await oncology evaluation on Wednesday

## 2024-11-14 NOTE — Assessment & Plan Note (Signed)
 Blood sugar level was 87 mg/dL, within normal range. Awaiting oncology evaluation to determine if Ozempic  should be discontinued due to potential pancreatic lesion. - Held Ozempic  pending oncology evaluation. - Ordered A1c test.

## 2024-11-14 NOTE — Assessment & Plan Note (Signed)
 Chronic, well controlled.  She will continue with bisoprolol  5mg  1/2 tablet daily.  - Follow low sodium diet.

## 2024-11-14 NOTE — Assessment & Plan Note (Signed)
 Increased use of nebulizer due to recent medication change from Breztri  to a combination of fomoterol and budesonide . Reports inconvenience with current regimen but notes some improvement in symptoms.

## 2024-11-15 NOTE — Progress Notes (Signed)
 Nn reached out to pt to collect the information about her annual income to put on the [employer] form. Pt also requests ABN. Pt states she will have to ask her husband for their annual income as he does yearly tax returns. Pt requested NN call back to tomorrow and she can the necessary information. NN verbalized understanding.

## 2024-11-16 ENCOUNTER — Encounter

## 2024-11-17 ENCOUNTER — Ambulatory Visit (HOSPITAL_COMMUNITY)
Admission: RE | Admit: 2024-11-17 | Discharge: 2024-11-17 | Disposition: A | Discharge status: Home / Self Care | Source: Ambulatory Visit | Attending: Internal Medicine | Admitting: Internal Medicine

## 2024-11-17 DIAGNOSIS — I6782 Cerebral ischemia: Secondary | ICD-10-CM | POA: Diagnosis not present

## 2024-11-17 DIAGNOSIS — C3490 Malignant neoplasm of unspecified part of unspecified bronchus or lung: Secondary | ICD-10-CM | POA: Insufficient documentation

## 2024-11-17 DIAGNOSIS — C3491 Malignant neoplasm of unspecified part of right bronchus or lung: Secondary | ICD-10-CM | POA: Diagnosis not present

## 2024-11-17 LAB — GLUCOSE, CAPILLARY: Glucose-Capillary: 59 mg/dL — ABNORMAL LOW (ref 70–99)

## 2024-11-17 MED ORDER — GADOBUTROL 1 MMOL/ML IV SOLN
8.0000 mL | Freq: Once | INTRAVENOUS | Status: AC | PRN
  Administered 2024-11-17: 8 mL via INTRAVENOUS

## 2024-11-17 NOTE — Progress Notes (Signed)
 Pts dtr, [name], called NN. NN attempted to reach [name] at number provided on VM. No answer. VM left.

## 2024-11-18 ENCOUNTER — Encounter (HOSPITAL_COMMUNITY): Payer: Self-pay

## 2024-11-19 ENCOUNTER — Encounter (HOSPITAL_COMMUNITY): Payer: Self-pay | Admitting: Internal Medicine

## 2024-11-22 ENCOUNTER — Encounter (HOSPITAL_COMMUNITY): Payer: Self-pay

## 2024-11-22 ENCOUNTER — Other Ambulatory Visit: Payer: Self-pay | Admitting: *Deleted

## 2024-11-22 DIAGNOSIS — C3491 Malignant neoplasm of unspecified part of right bronchus or lung: Secondary | ICD-10-CM

## 2024-11-23 ENCOUNTER — Inpatient Hospital Stay

## 2024-11-23 ENCOUNTER — Encounter: Payer: Self-pay | Admitting: *Deleted

## 2024-11-23 ENCOUNTER — Other Ambulatory Visit: Payer: Self-pay | Admitting: Internal Medicine

## 2024-11-23 ENCOUNTER — Inpatient Hospital Stay: Admitting: Internal Medicine

## 2024-11-23 ENCOUNTER — Encounter

## 2024-11-23 VITALS — BP 150/77 | HR 92 | Temp 97.7°F | Resp 17 | Ht 65.0 in | Wt 171.0 lb

## 2024-11-23 DIAGNOSIS — C3491 Malignant neoplasm of unspecified part of right bronchus or lung: Secondary | ICD-10-CM

## 2024-11-23 DIAGNOSIS — C799 Secondary malignant neoplasm of unspecified site: Secondary | ICD-10-CM

## 2024-11-23 DIAGNOSIS — C7889 Secondary malignant neoplasm of other digestive organs: Secondary | ICD-10-CM | POA: Diagnosis not present

## 2024-11-23 DIAGNOSIS — C770 Secondary and unspecified malignant neoplasm of lymph nodes of head, face and neck: Secondary | ICD-10-CM | POA: Diagnosis not present

## 2024-11-23 DIAGNOSIS — C3490 Malignant neoplasm of unspecified part of unspecified bronchus or lung: Secondary | ICD-10-CM

## 2024-11-23 DIAGNOSIS — C7989 Secondary malignant neoplasm of other specified sites: Secondary | ICD-10-CM | POA: Diagnosis not present

## 2024-11-23 DIAGNOSIS — C787 Secondary malignant neoplasm of liver and intrahepatic bile duct: Secondary | ICD-10-CM | POA: Diagnosis not present

## 2024-11-23 DIAGNOSIS — F17290 Nicotine dependence, other tobacco product, uncomplicated: Secondary | ICD-10-CM | POA: Diagnosis not present

## 2024-11-23 LAB — CMP (CANCER CENTER ONLY)
ALT: 15 U/L (ref 0–44)
AST: 30 U/L (ref 15–41)
Albumin: 3.9 g/dL (ref 3.5–5.0)
Alkaline Phosphatase: 72 U/L (ref 38–126)
Anion gap: 7 (ref 5–15)
BUN: 10 mg/dL (ref 8–23)
CO2: 30 mmol/L (ref 22–32)
Calcium: 9.4 mg/dL (ref 8.9–10.3)
Chloride: 104 mmol/L (ref 98–111)
Creatinine: 0.76 mg/dL (ref 0.44–1.00)
GFR, Estimated: >60 mL/min (ref >60)
Glucose, Bld: 73 mg/dL (ref 70–99)
Potassium: 4.1 mmol/L (ref 3.5–5.1)
Sodium: 141 mmol/L (ref 135–145)
Total Bilirubin: 0.4 mg/dL (ref 0.0–1.2)
Total Protein: 7 g/dL (ref 6.5–8.1)

## 2024-11-23 LAB — CBC WITH DIFFERENTIAL (CANCER CENTER ONLY)
Abs Immature Granulocytes: 0.02 K/uL (ref 0.00–0.07)
Basophils Absolute: 0 K/uL (ref 0.0–0.1)
Basophils Relative: 0 %
Eosinophils Absolute: 0.1 K/uL (ref 0.0–0.5)
Eosinophils Relative: 1 %
HCT: 33.2 % — ABNORMAL LOW (ref 36.0–46.0)
Hemoglobin: 10.7 g/dL — ABNORMAL LOW (ref 12.0–15.0)
Immature Granulocytes: 0 %
Lymphocytes Relative: 35 %
Lymphs Abs: 2.6 K/uL (ref 0.7–4.0)
MCH: 25.8 pg — ABNORMAL LOW (ref 26.0–34.0)
MCHC: 32.2 g/dL (ref 30.0–36.0)
MCV: 80.2 fL (ref 80.0–100.0)
Monocytes Absolute: 0.8 K/uL (ref 0.1–1.0)
Monocytes Relative: 12 %
Neutro Abs: 3.8 K/uL (ref 1.7–7.7)
Neutrophils Relative %: 52 %
Platelet Count: 489 K/uL — ABNORMAL HIGH (ref 150–400)
RBC: 4.14 MIL/uL (ref 3.87–5.11)
RDW: 17.1 % — ABNORMAL HIGH (ref 11.5–15.5)
WBC Count: 7.3 K/uL (ref 4.0–10.5)
nRBC: 0 % (ref 0.0–0.2)

## 2024-11-23 MED ORDER — LIDOCAINE-PRILOCAINE 2.5-2.5 % EX CREA: TOPICAL_CREAM | CUTANEOUS | 3 refills | Status: AC

## 2024-11-23 MED ORDER — PROCHLORPERAZINE MALEATE 10 MG PO TABS
10.0000 mg | ORAL_TABLET | Freq: Four times a day (QID) | ORAL | 1 refills | Status: AC | PRN
Start: 2024-11-23 — End: ?

## 2024-11-23 MED ORDER — ONDANSETRON HCL 8 MG PO TABS
8.0000 mg | ORAL_TABLET | Freq: Three times a day (TID) | ORAL | 1 refills | Status: AC | PRN
Start: 2024-11-23 — End: ?

## 2024-11-23 MED ORDER — FOLIC ACID 1 MG PO TABS: 1.0000 mg | ORAL_TABLET | Freq: Every day | ORAL | 3 refills | Status: AC

## 2024-11-23 MED ORDER — CYANOCOBALAMIN 1000 MCG/ML IJ SOLN
1000.0000 ug | Freq: Once | INTRAMUSCULAR | Status: AC
  Administered 2024-11-23: 1000 ug via INTRAMUSCULAR
  Filled 2024-11-23: qty 1

## 2024-11-23 NOTE — Progress Notes (Signed)
 START ON PATHWAY REGIMEN - Non-Small Cell Lung     A cycle is every 21 days:     Pembrolizumab      Pemetrexed      Carboplatin   **Always confirm dose/schedule in your pharmacy ordering system**  Patient Characteristics: Stage IV Metastatic, Nonsquamous, Molecular Analysis Completed, Molecular Alteration Present and Targeted Therapy Exhausted, OR EGFR/KRAS/HER2/NRG1/c-Met Present and Standard Chemotherapy/Immunotherapy Planned, OR No Alteration Present, Initial  Chemotherapy/Immunotherapy, PS = 0, 1, BRAF/MET/KRAS/HER2 Mutation Positive or c-Met High Overexpression Positive (EGFR Wildtype), Candidate for Immunotherapy, PD-L1 Expression Positive 1-49% (TPS) / Negative / Not Tested / Awaiting Test Results and  Immunotherapy Candidate Therapeutic Status: Stage IV Metastatic Histology: Nonsquamous Cell Broad Molecular Profiling Status: Molecular Analysis Completed Molecular Analysis Results: KRAS G12C Mutation Present and No Prior Chemo/Immunotherapy Chemotherapy/Immunotherapy Line of Therapy: Initial Chemotherapy/Immunotherapy ECOG Performance Status: 1 Immunotherapy Candidate Status: Candidate for Immunotherapy PD-L1 Expression Status: PD-L1 Negative Intent of Therapy: Non-Curative / Palliative Intent, Not Discussed with Patient

## 2024-11-23 NOTE — Research (Unsigned)
 Untreated Metastatic Non-Squamous Non-Small Cell Lung Cancer: Evaluation  of the Optimal Dose, Safety, and Efficacy of Livmoniplimab in Combination  with Budigalimab Plus Chemotherapy Versus Pembrolizumab Plus  Chemotherapy - LIVIGNO-4  Patient [name] was identified by Dr.Mohamed as a potential candidate for the above listed study.  This Clinical Research Nurse met with [name], [id number], on 11/24/24 in a manner and location that ensures patient privacy to discuss participation in the above listed research study.  Patient is Accompanied by her daughter.  A copy of the informed consent document with embedded HIPAA language was provided to the patient.  Patient reads, speaks, and understands English.   Patient was provided with the business card of this Nurse and encouraged to contact the research team with any questions.  Approximately 10 minutes were spent with the patient reviewing the informed consent documents.  Patient was provided the option of taking informed consent documents home to review and was encouraged to review at their convenience with their support network, including other care providers. Patient took the consent documents home to review. Pt stated her daughter who is a advice worker will help her understand the study consent. Informed pt that we will need to confirm her eligibility on the study. We will reach out to her sometime next week to give pt some time to read the consent. Pt did not give a specific day or time for us  to reach out to her but said it would be fine for us  to call her sometime next week to discuss the study. She did not have any further questions.   Tammy Larsen, RN, BSN Clinical Research Nurse [phone] 11/24/2024

## 2024-11-23 NOTE — Progress Notes (Signed)
 [facility] [phone]   [phone]  OFFICE PROGRESS NOTE  Jarold Medici, MD [address] [place] [address]  DIAGNOSIS: stage IV (TX, N2, M1 C) non-small cell lung cancer, adenocarcinoma presented with right hilar lymphadenopathy as well as left parotid gland metastasis in addition to liver, pancreatic tail and muscular metastasis diagnosed in October 2025.   Biomarker Findings Tumor Mutational Burden - 12 Muts/Mb HRD signature - HRDsig Negative Microsatellite status - MS-Stable Genomic Findings For a complete list of the genes assayed, please refer to the Appendix. KRAS G12C AKT2 amplification CCNE1 amplification PTEN R130* - subclonal? MCL1 amplification TP53 V157F 7 Disease relevant genes with no reportable alterations: ALK, BRAF, EGFR, ERBB2, MET, RET, ROS1  PDL1 TPS 0% cMET Expression: 0%  PRIOR THERAPY:  CURRENT THERAPY:  INTERVAL HISTORY: [name] 78 y.o. female returns to the clinic today for follow-up visit accompanied by her daughter. Discussed the use of AI scribe software for clinical note transcription with the patient, who gave verbal consent to proceed.  History of Present Illness [name] is a 78 year old female with stage four non-small cell lung cancer who presents for evaluation and recommendation regarding treatment. She is accompanied by her daughter, [name].  She was diagnosed with stage four non-small cell lung cancer, adenocarcinoma, in October 2025. Molecular studies showed a positive KRAS G12C mutation, negative PD-L1 expression, and negative CMET overexpression. Her current treatment plan involves chemotherapy and immunotherapy with carboplatin, Alimta, and Keytruda every three weeks for four cycles. After the initial cycles, carboplatin will be discontinued, and she will continue with Alimta and Keytruda.  She feels anxious since her last visit. She has no significant  physical complaints except for pain when yawning and opening her mouth, which is a new symptom. An MRI revealed a spot in the left parotid gland, in addition to sending 0.3 x 0.4 cm focus in the anterior body of the close on the right.  It is unclear if it is related to cancer or an old stroke. She recalls being told about mini strokes in the past.  She inquires about clinical trials and mentions a past biopsy confirming the primary source of cancer as the lung.     MEDICAL HISTORY: Past Medical History:  Diagnosis Date   Allergy  09/30/1996   Arthritis    Asthma    Complication of anesthesia    COPD (chronic obstructive pulmonary disease) (HCC)    Diabetes (HCC)    High cholesterol    History of radiation therapy    Right Lung- 12/04/21-12/11/21- Dr. Lynwood Nasuti   Hypertension    Lung cancer [facility])    Neuromuscular disorder (HCC)    Pneumonia 09/10/2017   PONV (postoperative nausea and vomiting)    Stroke (HCC) 2007   mini strokes    ALLERGIES:  is allergic to other.  MEDICATIONS:  Current Outpatient Medications  Medication Sig Dispense Refill   albuterol  (VENTOLIN  HFA) 108 (90 Base) MCG/ACT inhaler Inhale 2 puffs into the lungs every 4 (four) hours as needed for wheezing or shortness of breath. 18 g 12   arformoterol  (BROVANA ) 15 MCG/2ML NEBU Take 2 mLs (15 mcg total) by nebulization 2 (two) times daily. 120 mL 12   aspirin  81 MG tablet Take 81 mg by mouth daily.     atorvastatin  (LIPITOR) 80 MG tablet TAKE 1 TABLET BY MOUTH EVERY DAY MONDAY THROUGH FRIDAY 90 tablet 1   benzonatate  (TESSALON )  200 MG capsule Take 1 capsule (200 mg total) by mouth 3 (three) times daily. 20 capsule 0   Biotin 89999 MCG TBDP Take 10,000 mcg by mouth daily.     bisoprolol  (ZEBETA ) 5 MG tablet TAKE 1/2 TABLET BY MOUTH EVERY DAY 45 tablet 3   budesonide  (PULMICORT ) 0.5 MG/2ML nebulizer solution Take 2 mLs (0.5 mg total) by nebulization 2 (two) times daily. 120 mL 12   CALCIUM -MAGNESIUM -ZINC PO Take 1  capsule by mouth at bedtime.     Cholecalciferol (VITAMIN D3) 2000 UNITS TABS Take 2,000 Units by mouth at bedtime.     DULoxetine  (CYMBALTA ) 60 MG capsule TAKE 1 CAPSULE(60 MG) BY MOUTH DAILY 90 capsule 1   famotidine  (PEPCID ) 20 MG tablet Take 1 tablet (20 mg total) by mouth daily after supper. 90 tablet 3   ipratropium-albuterol  (DUONEB) 0.5-2.5 (3) MG/3ML SOLN Take 3 mLs by nebulization every 4 (four) hours as needed. 360 mL 0   Magnesium  250 MG TABS Take 1 tablet by mouth daily.     methocarbamol  (ROBAXIN ) 500 MG tablet Take 1 tablet (500 mg total) by mouth every 8 (eight) hours as needed for muscle spasms. 30 tablet 0   montelukast  (SINGULAIR ) 10 MG tablet TAKE 1 TABLET(10 MG) BY MOUTH AT BEDTIME 90 tablet 3   oxyCODONE -acetaminophen  (PERCOCET) 10-325 MG tablet Take 1 tablet by mouth every 6 (six) hours as needed for pain. 30 tablet 0   pantoprazole  (PROTONIX ) 40 MG tablet TAKE 1 TABLET BY MOUTH 30 MINUTES BEFORE FIRST MEAL OF THE DAY 90 tablet 2   predniSONE  (DELTASONE ) 10 MG tablet 2 each am until better then 1 each am x 5 days and stop (Patient taking differently: 10 mg. Patient reports taking as needed.) 100 tablet 2   pregabalin  (LYRICA ) 100 MG capsule Take 1 capsule (100 mg total) by mouth 3 (three) times daily. TAKE 1 CAPSULE(100 MG) BY MOUTH THREE TIMES DAILY 90 capsule 2   promethazine  (PHENERGAN ) 25 MG tablet Take 25 mg by mouth every 6 (six) hours as needed for nausea or vomiting.     REPATHA  SURECLICK 140 MG/ML SOAJ ADMINISTER 1 ML UNDER THE SKIN EVERY 14 DAYS 6 mL 3   Semaglutide , 1 MG/DOSE, (OZEMPIC , 1 MG/DOSE,) 4 MG/3ML SOPN Inject 1 mg into the skin once a week. Friday 3 mL 3   No current facility-administered medications for this visit.    SURGICAL HISTORY:  Past Surgical History:  Procedure Laterality Date   ABDOMINAL HYSTERECTOMY  1995   BRONCHIAL BIOPSY  10/23/2021   Procedure: BRONCHIAL BIOPSIES;  Surgeon: Brenna Adine CROME, DO;  Location: [facility];  Service:  Pulmonary;;   BRONCHIAL BRUSHINGS  10/23/2021   Procedure: BRONCHIAL BRUSHINGS;  Surgeon: Brenna Adine CROME, DO;  Location: [facility];  Service: Pulmonary;;   BRONCHIAL NEEDLE ASPIRATION BIOPSY  10/23/2021   Procedure: BRONCHIAL NEEDLE ASPIRATION BIOPSIES;  Surgeon: Brenna Adine CROME, DO;  Location: [facility];  Service: Pulmonary;;   carpel tunnel surgery Right 2006   COLONOSCOPY  2014   COLOSTOMY  2007   colostomy let down  2008   endoscopy  2014   FIDUCIAL MARKER PLACEMENT  10/23/2021   Procedure: FIDUCIAL MARKER PLACEMENT;  Surgeon: Brenna Adine CROME, DO;  Location: [facility];  Service: Pulmonary;;   rotator cuff tear Left 04/14/2020   TUBAL LIGATION     VIDEO BRONCHOSCOPY WITH ENDOBRONCHIAL NAVIGATION Right 10/23/2021   Procedure: VIDEO BRONCHOSCOPY WITH ENDOBRONCHIAL NAVIGATION;  Surgeon: Brenna Adine CROME, DO;  Location: [facility];  Service: Pulmonary;  Laterality: Right;  ION, w/ fiducial placement   VIDEO BRONCHOSCOPY WITH RADIAL ENDOBRONCHIAL ULTRASOUND  10/23/2021   Procedure: VIDEO BRONCHOSCOPY WITH RADIAL ENDOBRONCHIAL ULTRASOUND;  Surgeon: Brenna Adine CROME, DO;  Location: [facility];  Service: Pulmonary;;    REVIEW OF SYSTEMS:  Constitutional: negative Eyes: negative Ears, nose, mouth, throat, and face: negative Respiratory: positive for cough Cardiovascular: negative Gastrointestinal: negative Genitourinary:negative Integument/breast: negative Hematologic/lymphatic: negative Musculoskeletal:negative Neurological: negative Behavioral/Psych: negative Endocrine: negative Allergic/Immunologic: negative   PHYSICAL EXAMINATION: General appearance: alert, cooperative, and no distress Head: Normocephalic, without obvious abnormality, atraumatic Neck: no adenopathy, no JVD, supple, symmetrical, trachea midline, and thyroid  not enlarged, symmetric, no tenderness/mass/nodules Lymph nodes: Cervical, supraclavicular, and axillary nodes normal. Resp: clear to  auscultation bilaterally Back: symmetric, no curvature. ROM normal. No CVA tenderness. Cardio: regular rate and rhythm, S1, S2 normal, no murmur, click, rub or gallop GI: soft, non-tender; bowel sounds normal; no masses,  no organomegaly Extremities: extremities normal, atraumatic, no cyanosis or edema Neurologic: Alert and oriented X 3, normal strength and tone. Normal symmetric reflexes. Normal coordination and gait  ECOG PERFORMANCE STATUS: 0 - Asymptomatic  Blood pressure (!) 150/77, pulse 92, temperature 97.7 F (36.5 C), temperature source Temporal, resp. rate 17, height 5' 5 (1.651 m), weight 171 lb (77.6 kg), SpO2 100%.  LABORATORY DATA: Lab Results  Component Value Date   WBC 7.3 11/23/2024   HGB 10.7 (L) 11/23/2024   HCT 33.2 (L) 11/23/2024   MCV 80.2 11/23/2024   PLT 489 (H) 11/23/2024      Chemistry      Component Value Date/Time   NA 140 11/10/2024 1111   NA 139 05/31/2024 1240   K 4.2 11/10/2024 1111   CL 105 11/10/2024 1111   CO2 30 11/10/2024 1111   BUN 7 (L) 11/10/2024 1111   BUN 11 05/31/2024 1240   CREATININE 0.71 11/10/2024 1111   GLU 90 03/18/2022 0000      Component Value Date/Time   CALCIUM  9.4 11/10/2024 1111   ALKPHOS 68 11/10/2024 1111   AST 22 11/10/2024 1111   ALT 13 11/10/2024 1111   BILITOT 0.4 11/10/2024 1111       RADIOGRAPHIC STUDIES: MR BRAIN W WO CONTRAST Result Date: 11/21/2024 CLINICAL DATA:  Non-small cell lung cancer.  Staging. EXAM: MRI HEAD WITHOUT AND WITH CONTRAST TECHNIQUE: Multiplanar, multiecho pulse sequences of the brain and surrounding structures were obtained without and with intravenous contrast. CONTRAST:  8mL GADAVIST  GADOBUTROL  1 MMOL/ML IV SOLN COMPARISON:  Head CT 04/23/2017.  MRI 12/28/2007. FINDINGS: Brain: Diffusion imaging does not show any acute or subacute infarction or other cause of abnormal diffusion with respect to the brain. There chronic small-vessel ischemic changes of the pons. No focal cerebellar  finding. Cerebral hemispheres show moderate to severe chronic small-vessel ischemic changes of the white matter. No cortical or large vessel territory stroke. Single 3 x 4 mm enhancing focus within the anterior body of the caudate nucleus on the right could represent a solitary metastasis. The differential diagnosis would be subacute stroke. One could consider a repeat study in 4-6 weeks to see if this has changed. No hydrocephalus. No extra-axial collection. Vascular: Major vessels at the base of the brain show flow. Skull and upper cervical spine: Negative Sinuses/Orbits: Clear/normal Other: Redemonstration of a 2.3 cm enhancing mass, possibly with central necrosis, at the junction of the lateral aspect of the left masseter muscle and the anterior aspect of the superficial lobe of the parotid gland. Given the  other clinical findings, this could represent a metastasis. Primary parotid neoplasm would also be possible. IMPRESSION: 1. Single 3 x 4 mm enhancing focus within the anterior body of the caudate nucleus on the right. This could represent a solitary metastasis. The differential diagnosis would be subacute stroke. One could consider a repeat study in 4-6 weeks to see if this has changed. 2. 2.3 cm enhancing mass at the junction of the lateral aspect of the left masseter muscle and the anterior aspect of the superficial lobe of the parotid gland. Given the other clinical findings, this could represent a metastasis. Primary parotid neoplasm would also be possible. 3. Chronic small-vessel ischemic changes of the pons and cerebral hemispheric white matter. Electronically Signed   By: Oneil Officer M.D.   On: 11/21/2024 09:19   NM PET Image Restag (PS) Skull Base To Thigh Result Date: 11/09/2024 CLINICAL DATA:  Subsequent treatment strategy for non-small cell lung cancer. EXAM: NUCLEAR MEDICINE PET SKULL BASE TO THIGH TECHNIQUE: 8.8 mCi F-18 FDG was injected intravenously. Full-ring PET imaging was performed  from the skull base to thigh after the radiotracer. CT data was obtained and used for attenuation correction and anatomic localization. Fasting blood glucose: 87 mg/dl COMPARISON:  Multiple exams, including 09/11/2021 and CT examinations from 10/29/2024 and 10/12/2024 FINDINGS: Mediastinal blood pool activity: SUV max 2.3 Liver activity: SUV max NA NECK: Hypermetabolic 1.4 by 2.6 cm lesion at the junction of the anterior left parotid gland and left masseter muscle, more likely involving the parotid gland on image 28 series 4, maximum SUV 11.6, concerning for malignancy. Smaller lymph node along the posterior margin of the parotid gland 0.6 cm in short axis on image 29 series 4 with maximum SUV 8.3, likewise concerning for potential malignancy. Mild nodularity posteriorly along the right parotid gland with maximum SUV 5.0, nonspecific. High activity along the tip of the tongue, maximum SUV 13.9, probably physiologic given the location, although tissue planes are obscured by streak artifact from the patient's dental orthotic. Symmetric glottic activity, maximum SUV 8.9, likely physiologic. Incidental CT findings: Symmetric calcification of the globus pallidus nuclei in the brain, likely physiologic. Bilateral common carotid atheromatous vascular calcifications. CHEST: Right hilar lymph node 1.6 cm in short axis on image 68 series 4 with maximum SUV 11.7, compatible with active malignancy. Fiducial with adjacent posterior triangular consolidation in the right upper lobe just above the major fissure corresponding to site of previous treated tumor, maximum SUV in this triangular region is 3.3, and may reflect post treatment activity rather than necessarily a locally recurrent nodule at the original site of involvement. Incidental CT findings: Scarring in the left lower lobe. Coronary, aortic arch, and branch vessel atherosclerotic vascular disease. ABDOMEN/PELVIS: Segment 2 mass in the liver measures about 3.9 cm in  diameter with maximum SUV 14.4, compatible with malignancy. Hypermetabolic mass in the tail the pancreas adjacent to the lower splenic hilum with hypermetabolic activity measuring 2.2 cm in diameter with maximum SUV 10.9. This could be a metastatic lesion or a primary pancreatic malignancy. Focally accentuated activity inferiorly in the right rectus abdominus muscle just above the pubis with some mild associated asymmetric thickening of the muscle for example on image 176 of series 604, maximum SUV 7.4. Metastatic lesion not excluded. Small focus of accentuated metabolic activity in the right gluteus maximus muscle is likely incidental, metastatic lesion less likely, maximum SUV 3.2. Small focus of accentuated activity along the margin of the biceps and proximal brachialis musculature of the mid humeral  level, maximum SUV 5.2, this could be vascular activity given the left antecubital injection site, and may simply be incidental. Incidental CT findings: Extensive atheromatous vascular disease including the abdominal aorta. Scattered colonic diverticulosis. Anastomotic staple line in the sigmoid colon. SKELETON: No significant abnormal hypermetabolic activity in this region. Incidental CT findings: Spondylosis. IMPRESSION: 1. Hypermetabolic right hilar lymph node compatible with active malignancy. The treatment site of the original primary lesion is marked by fiducial and only demonstrates low-grade activity which may be treatment related. 2. Hypermetabolic segment 2 mass in the liver compatible with malignancy (likely metastatic). 3. Hypermetabolic mass in the tail the pancreas adjacent to the lower splenic hilum, potentially a metastatic lesion or a primary pancreatic malignancy. 4. Hypermetabolic lesion at the junction of the anterior left parotid gland and left masseter muscle, more likely involving the parotid gland, suspicious for malignancy. Smaller bilateral posterior mildly hypermetabolic parotid nodules are  also present. 5. Focally accentuated activity inferiorly in the right rectus abdominus muscle just above the pubis, maximum SUV 7.4. Metastatic lesion not excluded. 6. Small focus of accentuated activity along the margin of the left biceps and proximal brachialis musculature of the mid humeral level, maximum SUV 5.2, this could be vascular activity given the left antecubital injection site, and may simply be incidental. 7. Faint focal activity in the right gluteus maximus muscle without CT correlate, probably physiologic although may merit surveillance. 8. High activity along the tip of the tongue, maximum SUV 13.9, probably physiologic given the location, although tissue planes are obscured by streak artifact from the patient's dental orthotic. 9. Aortic Atherosclerosis (ICD10-I70.0). Notable systemic atherosclerosis. Electronically Signed   By: Ryan Salvage M.D.   On: 11/09/2024 10:54   CT ABDOMEN PELVIS W CONTRAST Result Date: 11/01/2024 CLINICAL DATA:  Non-small cell carcinoma the right lung restaging * Tracking Code: BO * EXAM: CT ABDOMEN AND PELVIS WITH CONTRAST TECHNIQUE: Multidetector CT imaging of the abdomen and pelvis was performed using the standard protocol following bolus administration of intravenous contrast. RADIATION DOSE REDUCTION: This exam was performed according to the departmental dose-optimization program which includes automated exposure control, adjustment of the mA and/or kV according to patient size and/or use of iterative reconstruction technique. CONTRAST:  OMNIPAQUE  IOHEXOL  300 MG/ML  SOLN COMPARISON:  Multiple exams, including 10/12/2024 and PET-CT 09/11/2021 FINDINGS: Lower chest: Scarring laterally in the left lower lobe. Descending thoracic aortic atherosclerotic vascular calcifications. Hepatobiliary: 3.6 by 2.7 by 3.6 cm mass in segment 2 of the liver on image 100 series 2, mixed internal density, concerning for malignancy. Pancreas: 2.3 by 1.6 by 1.6 cm  hypoenhancing mass/lesion the tip of the tail the pancreas on image 21 series 2, pancreatic adenocarcinoma is not excluded. No significant involvement of the splenic artery or vein at this time. No compelling findings of invasion of the adjacent kidney or spleen. Fatty lesion in the pancreatic tail on image 22 series 2, benign. Borderline dorsal pancreatic duct dilatation. Spleen: Unremarkable Adrenals/Urinary Tract: Benign-appearing fluid density 1.2 cm cyst of the right kidney upper pole warrants no further imaging workup. Stomach/Bowel: Anastomotic staple line in the upper rectum. Scattered diverticula of the ascending colon. Vascular/Lymphatic: Atherosclerosis is present, including aortoiliac atherosclerotic disease. Atheromatous plaque at the origins of the celiac trunk and SMA without occlusion. Reproductive: Uterus absent Other: No supplemental non-categorized findings. Musculoskeletal: Grade 1 degenerative anterolisthesis at L4-5 with mid and lower lumbar spondylosis and degenerative disc disease. IMPRESSION: 1. 3.6 cm hypoenhancing mass in segment 2 of the liver, suspicious for  malignancy. 2. 2.3 cm hypoenhancing mass/lesion at the tip of the tail the pancreas, concerning for metastatic lesion or pancreatic adenocarcinoma. 3.  Aortic Atherosclerosis (ICD10-I70.0).  Systemic atherosclerosis. 4. Lower lumbar spondylosis and degenerative disc disease. Electronically Signed   By: Ryan Salvage M.D.   On: 11/01/2024 10:56   US  CORE BIOPSY (SOFT TISSUE) Result Date: 10/28/2024 INDICATION: mass of jaw, abnormal ultrasound EXAM: ULTRASOUND-GUIDED LEFT SUBMANDIBULAR MASS BIOPSY COMPARISON:  HEAD AND NECK ULTRASOUND, 09/16/2024. MEDICATIONS: None ANESTHESIA/SEDATION: Local anesthetic was administered. COMPLICATIONS: None immediate. TECHNIQUE: Informed written consent was obtained from the patient after a discussion of the risks, benefits and alternatives to treatment. Questions regarding the procedure were  encouraged and answered. Initial ultrasound scanning demonstrated a dominant 2 cm subcutaneous mass at the LEFT submandibular neck. An ultrasound image was saved for documentation purposes. The procedure was planned. A timeout was performed prior to the initiation of the procedure. The operative was prepped and draped in the usual sterile fashion, and a sterile drape was applied covering the operative field. A timeout was performed prior to the initiation of the procedure. Local anesthesia was provided with 1% lidocaine  with epinephrine. Under direct ultrasound guidance, an 18 gauge core needle device was utilized to obtain to obtain 3 core needle biopsies of the LEFT submandibular mass. The samples were placed in saline and submitted to pathology. The needle was removed and superficial hemostasis was achieved with manual compression. Post procedure scan was negative for significant hematoma. A dressing was applied. The patient tolerated the procedure well without immediate postprocedural complication. IMPRESSION: Successful ultrasound guided biopsy of the larger LEFT submandibular neck mass. Thom Hall, MD [facility] Electronically Signed   By: Thom Hall M.D.   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  Molecular study showed positive KRAS G12C mutation and negative PD-L1 expression as well as negative c MET overexpression. I had a lengthy discussion with the patient and her daughter today about her current disease stage, prognosis and treatment options.  The patient understands that she has incurable condition and all the treatment will be of  palliative nature with the goal of prolongation of her life and palliation of her symptoms.  Assessment and Plan Assessment & Plan Stage IV non-small cell lung cancer, adenocarcinoma, KRAS G12C mutation positive Stage IV non-small cell lung cancer, adenocarcinoma, with KRAS G12C mutation positive. Negative PD-L1 and CMET overexpression. Treatment plan involves initial chemotherapy and immunotherapy due to KRAS G12C mutation being a second-line option. Discussed potential side effects including mild hair loss, nausea, vomiting, and blood count drops. Emphasized the importance of monitoring blood counts weekly during the induction phase. Discussed the possibility of transitioning to maintenance therapy with Alimta and Keytruda after four cycles. Explained that Keytruda can be continued for up to two years, and Alimta can be continued indefinitely as long as it is effective. Discussed the potential for clinical trial participation if eligible. - Will administer carboplatin, Alimta, and Keytruda every three weeks for four cycles. - After four cycles, will continue Alimta and Keytruda every three weeks. - Will monitor blood counts weekly during induction phase. - Will administer B12 injection every nine weeks. - Will prescribe folic acid  and  nausea medication. - Will arrange for port placement for treatment administration. - Will discuss clinical trial eligibility for the [employer]vie -[id number] with research coordinator.  Possible left parotid gland lesion of uncertain etiology (cancer versus old stroke) Possible left parotid gland lesion identified on MRI, with uncertain etiology. Differential diagnosis includes cancer or old stroke. Further evaluation by neuro-oncologist Dr. Buckley is planned to determine the nature of the lesion. - Referred to neuro-oncologist Dr. Buckley for further evaluation of the parotid gland lesion.  Anxiety disorder Reports anxiety since the last visit. She was advised to call  immediately if she has any other concerning symptoms in the interval. The patient voices understanding of current disease status and treatment options and is in agreement with the current care plan.  All questions were answered. The patient knows to call the clinic with any problems, questions or concerns. We can certainly see the patient much sooner if necessary.  The total time spent in the appointment was 55 minutes including review of chart and various tests results, discussions about plan of care and coordination of care plan .   Disclaimer: This note was dictated with voice recognition software. Similar sounding words can inadvertently be transcribed and may not be corrected upon review.

## 2024-11-24 ENCOUNTER — Other Ambulatory Visit: Payer: Self-pay

## 2024-11-24 ENCOUNTER — Encounter: Payer: Self-pay | Admitting: Internal Medicine

## 2024-11-24 ENCOUNTER — Telehealth: Payer: Self-pay | Admitting: Internal Medicine

## 2024-11-24 NOTE — Telephone Encounter (Signed)
 Scheduled patient for next appointment. Called and spoke with the patient, she is aware.

## 2024-11-26 ENCOUNTER — Other Ambulatory Visit: Payer: Self-pay

## 2024-11-29 ENCOUNTER — Telehealth: Payer: Self-pay | Admitting: *Deleted

## 2024-11-29 ENCOUNTER — Encounter: Payer: Self-pay | Admitting: Internal Medicine

## 2024-11-29 ENCOUNTER — Other Ambulatory Visit: Payer: Self-pay | Admitting: Physician Assistant

## 2024-11-29 DIAGNOSIS — C3491 Malignant neoplasm of unspecified part of right bronchus or lung: Secondary | ICD-10-CM

## 2024-11-29 NOTE — Telephone Encounter (Signed)
 Spoke with pt this morning to inform her that she is ineligible for the Abbvie clinical trial. Pt verbalized understanding and did not have any questions. She was thanked for her time and consideration.   Mazie Larsen, RN, BSN Clinical Research Nurse [phone] 11/29/2024

## 2024-11-29 NOTE — Discharge Instructions (Signed)
 Implanted Port Insertion After Care       After the procedure, it is common to have discomfort at the port insertion site, as well as bruising on the skin over the port. This should improve over 3-4 days.    After your port is placed, you will get a manufacturer's information card. The card has information about your port. Keep this card with you at all times.      Follow instructions from your health care provider about how to take care of your port insertion site. Make sure you wash your hands with soap and water for at least 20 seconds before and after you change your bandage (dressing). If soap and water are not available, use hand sanitizer.  Leave your initial bandage on for a full 24 hours. After 24 hours you may remove the dressing and shower.  Do not scrub directly on the incision site but rather above it and let the soapy water run over the incision. Pat dry after. You may then opt to redress your incision for your comfort but you may just leave it open to air.  The Dermabond (surgical super glue) will protect your incision and keep it clean and dry. Leave the layer of skin glue in place. If the glue edges start to loosen and curl up, you may trim the loose edges but do not pull or pick at it. Do NOT apply neosporin or other antibacterial ointment to the surgical glue.  It will dissolve the glue and expose your new incision to possible infection.  Do NOT apply EMLA  numbing cream to the surgical glue.  It will dissolve the glue and expose your new incision to possible infection.  You may have to wait to use the EMLA  cream until your incision has healed.    Return to your normal activities as told by your health care provider. Ask your health care provider what activities are safe for you. You may resume over-the-counter and prescription medicines as prescribed by your health care provider. Do not take baths, swim, or use a hot tub until your incision has healed completely (usually 2 weeks).      You were given a sedative during the procedure, and it can affect you for several hours. Do not drive, operate machinery or sign important documents for 24 hours after your procedure.     Please contact our office at [phone] and ask to speak with the nurse if you have any signs of an infection, such as fever or chills, redness, swelling, or pain around your port insertion site, fluid or blood coming from your port insertion site, if your port insertion site feels warm to the touch and/or if you have pus or a bad smell coming from the port insertion site.       If you need to speak to someone after hours, please call the Interventional Radiology on-call service at [phone] and tell them you are a patient of Dr. Terrill and you had a portacath placed today, as well as any issues you are having.    Thank you for visiting [facility] today!

## 2024-11-30 ENCOUNTER — Telehealth: Payer: Self-pay

## 2024-11-30 ENCOUNTER — Encounter

## 2024-11-30 NOTE — Progress Notes (Signed)
 See telephone note

## 2024-12-01 ENCOUNTER — Other Ambulatory Visit: Payer: Self-pay | Admitting: Pharmacist

## 2024-12-02 ENCOUNTER — Inpatient Hospital Stay: Attending: Internal Medicine | Admitting: Internal Medicine

## 2024-12-02 VITALS — BP 126/64 | HR 99 | Temp 97.7°F | Resp 16 | Wt 170.0 lb

## 2024-12-02 DIAGNOSIS — Z87891 Personal history of nicotine dependence: Secondary | ICD-10-CM | POA: Diagnosis not present

## 2024-12-02 DIAGNOSIS — C7989 Secondary malignant neoplasm of other specified sites: Secondary | ICD-10-CM | POA: Insufficient documentation

## 2024-12-02 DIAGNOSIS — C3491 Malignant neoplasm of unspecified part of right bronchus or lung: Secondary | ICD-10-CM | POA: Diagnosis present

## 2024-12-02 DIAGNOSIS — C7889 Secondary malignant neoplasm of other digestive organs: Secondary | ICD-10-CM | POA: Diagnosis not present

## 2024-12-02 DIAGNOSIS — C787 Secondary malignant neoplasm of liver and intrahepatic bile duct: Secondary | ICD-10-CM | POA: Diagnosis present

## 2024-12-02 DIAGNOSIS — G939 Disorder of brain, unspecified: Secondary | ICD-10-CM | POA: Diagnosis not present

## 2024-12-02 DIAGNOSIS — Z79899 Other long term (current) drug therapy: Secondary | ICD-10-CM | POA: Insufficient documentation

## 2024-12-02 DIAGNOSIS — Z79620 Long term (current) use of immunosuppressive biologic: Secondary | ICD-10-CM | POA: Diagnosis not present

## 2024-12-02 DIAGNOSIS — Z5112 Encounter for antineoplastic immunotherapy: Secondary | ICD-10-CM | POA: Insufficient documentation

## 2024-12-02 DIAGNOSIS — G9389 Other specified disorders of brain: Secondary | ICD-10-CM

## 2024-12-02 NOTE — Progress Notes (Signed)
 [facility] [address]  [place], [place] [address] [phone]   New Patient Evaluation  Date of Service: 12/02/24 Patient Name: [name] Patient MRN: [id number] Patient DOB: 1946-01-31 Provider: Arthea MARLA Manns, MD  Identifying Statement:  [name] is a 78 y.o. female with Brain mass - Plan: MR BRAIN W WO CONTRAST who presents for initial consultation and evaluation regarding cancer associated neurologic deficits.    Referring Provider: Sherrod Sherrod, MD [address] [place],  [place] [address]  Primary Cancer:  Oncologic History: Oncology History  Adenocarcinoma, lung, right (HCC)  05/28/2022 Initial Diagnosis   Adenocarcinoma, lung, right (HCC)   11/10/2024 Cancer Staging   Staging form: Lung, AJCC 8th Edition - Clinical: Stage IVB (cT1a, cN2, cM1c) - Signed by Sherrod Sherrod, MD on 11/10/2024   12/09/2024 -  Chemotherapy   Patient is on Treatment Plan : LUNG Carboplatin (5) + Pemetrexed (500) + Pembrolizumab (200) D1 q21d Induction x 4 cycles / Maintenance Pemetrexed (500) + Pembrolizumab (200) D1 q21d       History of Present Illness: The patient's records from the referring physician were obtained and reviewed and the patient interviewed to confirm this HPI.  [name] presents today for evaluation.  Her lung cancer screening MRI study demonstrated an abnormality within the right basal ganglia.  She denies neurologic symptoms at this time.  Has been dosing daily aspirin  since 2008 following stroke at that time.  Plan is for port placement tomorrow and initiation of chemo next week with Dr. Sherrod.  Medications: Current Outpatient Medications on File Prior to Visit  Medication Sig Dispense Refill   albuterol  (VENTOLIN  HFA) 108 (90 Base) MCG/ACT inhaler Inhale 2 puffs into the lungs every 4 (four) hours as needed for wheezing or shortness of breath. 18 g 12   arformoterol  (BROVANA ) 15  MCG/2ML NEBU Take 2 mLs (15 mcg total) by nebulization 2 (two) times daily. 120 mL 12   aspirin  81 MG tablet Take 81 mg by mouth daily.     atorvastatin  (LIPITOR) 80 MG tablet TAKE 1 TABLET BY MOUTH EVERY DAY MONDAY THROUGH FRIDAY 90 tablet 1   benzonatate  (TESSALON ) 200 MG capsule Take 1 capsule (200 mg total) by mouth 3 (three) times daily. 20 capsule 0   Biotin 89999 MCG TBDP Take 10,000 mcg by mouth daily.     bisoprolol  (ZEBETA ) 5 MG tablet TAKE 1/2 TABLET BY MOUTH EVERY DAY 45 tablet 3   budesonide  (PULMICORT ) 0.5 MG/2ML nebulizer solution Take 2 mLs (0.5 mg total) by nebulization 2 (two) times daily. 120 mL 12   CALCIUM -MAGNESIUM -ZINC PO Take 1 capsule by mouth at bedtime.     Cholecalciferol (VITAMIN D3) 2000 UNITS TABS Take 2,000 Units by mouth at bedtime.     DULoxetine  (CYMBALTA ) 60 MG capsule TAKE 1 CAPSULE(60 MG) BY MOUTH DAILY 90 capsule 1   famotidine  (PEPCID ) 20 MG tablet Take 1 tablet (20 mg total) by mouth daily after supper. 90 tablet 3   folic acid  (FOLVITE ) 1 MG tablet Take 1 tablet (1 mg total) by mouth daily. Start 7 days before pemetrexed chemotherapy. Continue until 21 days after pemetrexed completed. 100 tablet 3   ipratropium-albuterol  (DUONEB) 0.5-2.5 (3) MG/3ML SOLN Take 3 mLs by nebulization every 4 (four) hours as needed. 360 mL 0   lidocaine -prilocaine  (EMLA ) cream Apply to affected area once 30 g 3   Magnesium  250 MG TABS Take 1 tablet by mouth daily.  methocarbamol  (ROBAXIN ) 500 MG tablet Take 1 tablet (500 mg total) by mouth every 8 (eight) hours as needed for muscle spasms. 30 tablet 0   montelukast  (SINGULAIR ) 10 MG tablet TAKE 1 TABLET(10 MG) BY MOUTH AT BEDTIME 90 tablet 3   ondansetron  (ZOFRAN ) 8 MG tablet Take 1 tablet (8 mg total) by mouth every 8 (eight) hours as needed for nausea or vomiting. Start on the third day after carboplatin. 30 tablet 1   oxyCODONE -acetaminophen  (PERCOCET) 10-325 MG tablet Take 1 tablet by mouth every 6 (six) hours as needed  for pain. 30 tablet 0   pantoprazole  (PROTONIX ) 40 MG tablet TAKE 1 TABLET BY MOUTH 30 MINUTES BEFORE FIRST MEAL OF THE DAY 90 tablet 2   predniSONE  (DELTASONE ) 10 MG tablet 2 each am until better then 1 each am x 5 days and stop (Patient not taking: Reported on 12/02/2024) 100 tablet 2   pregabalin  (LYRICA ) 100 MG capsule Take 1 capsule (100 mg total) by mouth 3 (three) times daily. TAKE 1 CAPSULE(100 MG) BY MOUTH THREE TIMES DAILY 90 capsule 2   prochlorperazine  (COMPAZINE ) 10 MG tablet Take 1 tablet (10 mg total) by mouth every 6 (six) hours as needed for nausea or vomiting. 30 tablet 1   promethazine  (PHENERGAN ) 25 MG tablet Take 25 mg by mouth every 6 (six) hours as needed for nausea or vomiting.     REPATHA  SURECLICK 140 MG/ML SOAJ ADMINISTER 1 ML UNDER THE SKIN EVERY 14 DAYS 6 mL 3   Semaglutide , 1 MG/DOSE, (OZEMPIC , 1 MG/DOSE,) 4 MG/3ML SOPN Inject 1 mg into the skin once a week. Friday 3 mL 3   No current facility-administered medications on file prior to visit.    Allergies:  Allergies  Allergen Reactions   Other Swelling    HAIR DYE   Past Medical History:  Past Medical History:  Diagnosis Date   Allergy  09/30/1996   Arthritis    Asthma    Complication of anesthesia    COPD (chronic obstructive pulmonary disease) (HCC)    Diabetes (HCC)    High cholesterol    History of radiation therapy    Right Lung- 12/04/21-12/11/21- Dr. Lynwood Nasuti   Hypertension    Lung cancer [facility])    Neuromuscular disorder (HCC)    Pneumonia 09/10/2017   PONV (postoperative nausea and vomiting)    Stroke (HCC) 2007   mini strokes   Past Surgical History:  Past Surgical History:  Procedure Laterality Date   ABDOMINAL HYSTERECTOMY  1995   BRONCHIAL BIOPSY  10/23/2021   Procedure: BRONCHIAL BIOPSIES;  Surgeon: Brenna Adine CROME, DO;  Location: [facility] END[facility];  Service: Pulmonary;;   BRONCHIAL BRUSHINGS  10/23/2021   Procedure: BRONCHIAL BRUSHINGS;  Surgeon: Brenna Adine CROME, DO;  Location: [facility]  ENDOSCOPY;  Service: Pulmonary;;   BRONCHIAL NEEDLE ASPIRATION BIOPSY  10/23/2021   Procedure: BRONCHIAL NEEDLE ASPIRATION BIOPSIES;  Surgeon: Brenna Adine CROME, DO;  Location: [facility] ENDOSCOPY;  Service: Pulmonary;;   carpel tunnel surgery Right 2006   COLONOSCOPY  2014   COLOSTOMY  2007   colostomy let down  2008   endoscopy  2014   FIDUCIAL MARKER PLACEMENT  10/23/2021   Procedure: FIDUCIAL MARKER PLACEMENT;  Surgeon: Brenna Adine CROME, DO;  Location: [facility] ENDOSCOPY;  Service: Pulmonary;;   rotator cuff tear Left 04/14/2020   TUBAL LIGATION     VIDEO BRONCHOSCOPY WITH ENDOBRONCHIAL NAVIGATION Right 10/23/2021   Procedure: VIDEO BRONCHOSCOPY WITH ENDOBRONCHIAL NAVIGATION;  Surgeon: Brenna Adine CROME, DO;  Location:  [facility];  Service: Pulmonary;  Laterality: Right;  ION, w/ fiducial placement   VIDEO BRONCHOSCOPY WITH RADIAL ENDOBRONCHIAL ULTRASOUND  10/23/2021   Procedure: VIDEO BRONCHOSCOPY WITH RADIAL ENDOBRONCHIAL ULTRASOUND;  Surgeon: Brenna Adine CROME, DO;  Location: [facility] ENDOS[facility];  Service: Pulmonary;;   Social History:  Social History   Socioeconomic History   Marital status: Married    Spouse name: Not on file   Number of children: Not on file   Years of education: Not on file   Highest education level: Associate degree: occupational, scientist, product/process development, or vocational program  Occupational History   Occupation: Retired    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  Tobacco Use   Smoking status: Former    Current packs/day: 0.00    Average packs/day: 0.5 packs/day for 42.0 years (21.0 ttl pk-yrs)    Types: Cigarettes    Start date: 01/30/1967    Quit date: 01/30/2006    Years since quitting: 18.8   Smokeless tobacco: Never   Tobacco comments:    intermittent cycles b/w 1/4-1/2ppd  Vaping Use   Vaping status: Never Used  Substance and Sexual Activity   Alcohol use: Not Currently    Comment: 1 drink a year, maybe   Drug use: No   Sexual activity: Not Currently    Birth  control/protection: Abstinence, None  Other Topics Concern   Not on file  Social History Narrative   Not on file   Social Drivers of Health   Financial Resource Strain: Medium Risk (09/13/2024)   Overall Financial Resource Strain (CARDIA)    Difficulty of Paying Living Expenses: Somewhat hard  Food Insecurity: No Food Insecurity (11/10/2024)   Hunger Vital Sign    Worried About Running Out of Food in the Last Year: Never true    Ran Out of Food in the Last Year: Never true  Transportation Needs: No Transportation Needs (11/10/2024)   PRAPARE - Administrator, Civil Service (Medical): No    Lack of Transportation (Non-Medical): No  Physical Activity: Inactive (09/13/2024)   Exercise Vital Sign    Days of Exercise per Week: 0 days    Minutes of Exercise per Session: Not on file  Stress: Stress Concern Present (09/13/2024)   [employer] of Occupational Health - Occupational Stress Questionnaire    Feeling of Stress: To some extent  Social Connections: Moderately Integrated (09/13/2024)   Social Connection and Isolation Panel    Frequency of Communication with Friends and Family: Once a week    Frequency of Social Gatherings with Friends and Family: Once a week    Attends Religious Services: More than 4 times per year    Active Member of [employer] or Organizations: Yes    Attends Engineer, Structural: More than 4 times per year    Marital Status: Married  Catering Manager Violence: Not At Risk (06/29/2024)   Humiliation, Afraid, Rape, and Kick questionnaire    Fear of Current or Ex-Partner: No    Emotionally Abused: No    Physically Abused: No    Sexually Abused: No   Family History:  Family History  Problem Relation Age of Onset   Heart murmur Mother    Heart attack Father    Diabetes Father    Diabetes Brother    Stroke Brother    Vision loss Brother    Vision loss Brother     Review of Systems: Constitutional: Doesn't report fevers, chills or  abnormal weight loss Eyes: Doesn't report  blurriness of vision Ears, nose, mouth, throat, and face: Doesn't report sore throat Respiratory: Doesn't report cough, dyspnea or wheezes Cardiovascular: Doesn't report palpitation, chest discomfort  Gastrointestinal:  Doesn't report nausea, constipation, diarrhea GU: Doesn't report incontinence Skin: Doesn't report skin rashes Neurological: Per HPI Musculoskeletal: Doesn't report joint pain Behavioral/Psych: Doesn't report anxiety  Physical Exam: Vitals:   12/02/24 1444  BP: 126/64  Pulse: 99  Resp: 16  Temp: 97.7 F (36.5 C)  SpO2: 97%   KPS: 90. General: Alert, cooperative, pleasant, in no acute distress Head: Normal EENT: No conjunctival injection or scleral icterus.  Lungs: Resp effort normal Cardiac: Regular rate Abdomen: Non-distended abdomen Skin: No rashes cyanosis or petechiae. Extremities: No clubbing or edema  Neurologic Exam: Mental Status: Awake, alert, attentive to examiner. Oriented to self and environment. Language is fluent with intact comprehension.  Cranial Nerves: Visual acuity is grossly normal. Visual fields are full. Extra-ocular movements intact. No ptosis. Face is symmetric Motor: Tone and bulk are normal. Power is full in both arms and legs. Reflexes are symmetric, no pathologic reflexes present.  Sensory: Intact to light touch Gait: Normal.   Labs: I have reviewed the data as listed    Component Value Date/Time   NA 141 11/23/2024 1115   NA 139 05/31/2024 1240   K 4.1 11/23/2024 1115   CL 104 11/23/2024 1115   CO2 30 11/23/2024 1115   GLUCOSE 73 11/23/2024 1115   BUN 10 11/23/2024 1115   BUN 11 05/31/2024 1240   CREATININE 0.76 11/23/2024 1115   CALCIUM  9.4 11/23/2024 1115   PROT 7.0 11/23/2024 1115   PROT 7.0 02/18/2024 1205   ALBUMIN 3.9 11/23/2024 1115   ALBUMIN 4.2 02/18/2024 1205   AST 30 11/23/2024 1115   ALT 15 11/23/2024 1115   ALKPHOS 72 11/23/2024 1115   BILITOT 0.4 11/23/2024  1115   GFRNONAA >60 11/23/2024 1115   GFRAA 81 01/04/2021 1450   Lab Results  Component Value Date   WBC 7.3 11/23/2024   NEUTROABS 3.8 11/23/2024   HGB 10.7 (L) 11/23/2024   HCT 33.2 (L) 11/23/2024   MCV 80.2 11/23/2024   PLT 489 (H) 11/23/2024    Imaging:  MR BRAIN W WO CONTRAST Result Date: 11/21/2024 CLINICAL DATA:  Non-small cell lung cancer.  Staging. EXAM: MRI HEAD WITHOUT AND WITH CONTRAST TECHNIQUE: Multiplanar, multiecho pulse sequences of the brain and surrounding structures were obtained without and with intravenous contrast. CONTRAST:  8mL GADAVIST  GADOBUTROL  1 MMOL/ML IV SOLN COMPARISON:  Head CT 04/23/2017.  MRI 12/28/2007. FINDINGS: Brain: Diffusion imaging does not show any acute or subacute infarction or other cause of abnormal diffusion with respect to the brain. There chronic small-vessel ischemic changes of the pons. No focal cerebellar finding. Cerebral hemispheres show moderate to severe chronic small-vessel ischemic changes of the white matter. No cortical or large vessel territory stroke. Single 3 x 4 mm enhancing focus within the anterior body of the caudate nucleus on the right could represent a solitary metastasis. The differential diagnosis would be subacute stroke. One could consider a repeat study in 4-6 weeks to see if this has changed. No hydrocephalus. No extra-axial collection. Vascular: Major vessels at the base of the brain show flow. Skull and upper cervical spine: Negative Sinuses/Orbits: Clear/normal Other: Redemonstration of a 2.3 cm enhancing mass, possibly with central necrosis, at the junction of the lateral aspect of the left masseter muscle and the anterior aspect of the superficial lobe of the parotid gland. Given the other  clinical findings, this could represent a metastasis. Primary parotid neoplasm would also be possible. IMPRESSION: 1. Single 3 x 4 mm enhancing focus within the anterior body of the caudate nucleus on the right. This could represent  a solitary metastasis. The differential diagnosis would be subacute stroke. One could consider a repeat study in 4-6 weeks to see if this has changed. 2. 2.3 cm enhancing mass at the junction of the lateral aspect of the left masseter muscle and the anterior aspect of the superficial lobe of the parotid gland. Given the other clinical findings, this could represent a metastasis. Primary parotid neoplasm would also be possible. 3. Chronic small-vessel ischemic changes of the pons and cerebral hemispheric white matter. Electronically Signed   By: Oneil Officer M.D.   On: 11/21/2024 09:19   NM PET Image Restag (PS) Skull Base To Thigh Result Date: 11/09/2024 CLINICAL DATA:  Subsequent treatment strategy for non-small cell lung cancer. EXAM: NUCLEAR MEDICINE PET SKULL BASE TO THIGH TECHNIQUE: 8.8 mCi F-18 FDG was injected intravenously. Full-ring PET imaging was performed from the skull base to thigh after the radiotracer. CT data was obtained and used for attenuation correction and anatomic localization. Fasting blood glucose: 87 mg/dl COMPARISON:  Multiple exams, including 09/11/2021 and CT examinations from 10/29/2024 and 10/12/2024 FINDINGS: Mediastinal blood pool activity: SUV max 2.3 Liver activity: SUV max NA NECK: Hypermetabolic 1.4 by 2.6 cm lesion at the junction of the anterior left parotid gland and left masseter muscle, more likely involving the parotid gland on image 28 series 4, maximum SUV 11.6, concerning for malignancy. Smaller lymph node along the posterior margin of the parotid gland 0.6 cm in short axis on image 29 series 4 with maximum SUV 8.3, likewise concerning for potential malignancy. Mild nodularity posteriorly along the right parotid gland with maximum SUV 5.0, nonspecific. High activity along the tip of the tongue, maximum SUV 13.9, probably physiologic given the location, although tissue planes are obscured by streak artifact from the patient's dental orthotic. Symmetric glottic  activity, maximum SUV 8.9, likely physiologic. Incidental CT findings: Symmetric calcification of the globus pallidus nuclei in the brain, likely physiologic. Bilateral common carotid atheromatous vascular calcifications. CHEST: Right hilar lymph node 1.6 cm in short axis on image 68 series 4 with maximum SUV 11.7, compatible with active malignancy. Fiducial with adjacent posterior triangular consolidation in the right upper lobe just above the major fissure corresponding to site of previous treated tumor, maximum SUV in this triangular region is 3.3, and may reflect post treatment activity rather than necessarily a locally recurrent nodule at the original site of involvement. Incidental CT findings: Scarring in the left lower lobe. Coronary, aortic arch, and branch vessel atherosclerotic vascular disease. ABDOMEN/PELVIS: Segment 2 mass in the liver measures about 3.9 cm in diameter with maximum SUV 14.4, compatible with malignancy. Hypermetabolic mass in the tail the pancreas adjacent to the lower splenic hilum with hypermetabolic activity measuring 2.2 cm in diameter with maximum SUV 10.9. This could be a metastatic lesion or a primary pancreatic malignancy. Focally accentuated activity inferiorly in the right rectus abdominus muscle just above the pubis with some mild associated asymmetric thickening of the muscle for example on image 176 of series 604, maximum SUV 7.4. Metastatic lesion not excluded. Small focus of accentuated metabolic activity in the right gluteus maximus muscle is likely incidental, metastatic lesion less likely, maximum SUV 3.2. Small focus of accentuated activity along the margin of the biceps and proximal brachialis musculature of the mid humeral level,  maximum SUV 5.2, this could be vascular activity given the left antecubital injection site, and may simply be incidental. Incidental CT findings: Extensive atheromatous vascular disease including the abdominal aorta. Scattered colonic  diverticulosis. Anastomotic staple line in the sigmoid colon. SKELETON: No significant abnormal hypermetabolic activity in this region. Incidental CT findings: Spondylosis. IMPRESSION: 1. Hypermetabolic right hilar lymph node compatible with active malignancy. The treatment site of the original primary lesion is marked by fiducial and only demonstrates low-grade activity which may be treatment related. 2. Hypermetabolic segment 2 mass in the liver compatible with malignancy (likely metastatic). 3. Hypermetabolic mass in the tail the pancreas adjacent to the lower splenic hilum, potentially a metastatic lesion or a primary pancreatic malignancy. 4. Hypermetabolic lesion at the junction of the anterior left parotid gland and left masseter muscle, more likely involving the parotid gland, suspicious for malignancy. Smaller bilateral posterior mildly hypermetabolic parotid nodules are also present. 5. Focally accentuated activity inferiorly in the right rectus abdominus muscle just above the pubis, maximum SUV 7.4. Metastatic lesion not excluded. 6. Small focus of accentuated activity along the margin of the left biceps and proximal brachialis musculature of the mid humeral level, maximum SUV 5.2, this could be vascular activity given the left antecubital injection site, and may simply be incidental. 7. Faint focal activity in the right gluteus maximus muscle without CT correlate, probably physiologic although may merit surveillance. 8. High activity along the tip of the tongue, maximum SUV 13.9, probably physiologic given the location, although tissue planes are obscured by streak artifact from the patient's dental orthotic. 9. Aortic Atherosclerosis (ICD10-I70.0). Notable systemic atherosclerosis. Electronically Signed   By: Ryan Salvage M.D.   On: 11/09/2024 10:54     Assessment/Plan Brain mass - Plan: MR BRAIN W WO CONTRAST  [name] presents with radiographic abnormality within right caudate  head.  Pattern of enhancement is consistent with either novel metastasis from lung cancer, or enhancing subacute infarct.  There is considerable vascular change within subcortex and basal ganglia when compared to 2008 study, so vascular/stroke effect is certainly possible.    Recommended repeating MRI study with 3T protocol in 4-6 weeks.  If lesion progresses will recommend SRS.  She is agreeable with this.  We spent twenty additional minutes teaching regarding the natural history, biology, and historical experience in the treatment of neurologic complications of cancer.   We appreciate the opportunity to participate in the care of [name].   We ask that [name] return to clinic in 1 months following next brain MRI, or sooner as needed.  All questions were answered. The patient knows to call the clinic with any problems, questions or concerns. No barriers to learning were detected.  The total time spent in the encounter was 40 minutes and more than 50% was on counseling and review of test results   Arthea MARLA Manns, MD Medical Director of Neuro-Oncology [facility] 12/02/24 3:14 PM

## 2024-12-02 NOTE — H&P (Signed)
 Chief Complaint: Patient was seen in consultation today for metastatic lung cancer  Referring Physician(s): Mohamed,Mohamed  Supervising Physician: Jennefer Rover  Patient Status: [facility] - Outpatient   History of Present Illness: [name] is a 78 y.o. female with a medical history significant for HTN, COPD, CHF,  DM, stroke and right lung cancer s/p radiation therapy in 2022. In September 2025 she developed left jaw pain and imaging showed two abnormal soft tissue lesions. She was seen in IR 10/28/24 for a left neck mass biopsy and pathology returned positive for adenocarcinoma. Additional imaging showed multiple metastatic lesions consistent with Stage IV lung cancer. Her oncology team is preparing her for systemic therapy and she will require durable venous access.   Interventional Radiology has been asked to evaluate this patient for an image-guided port-a-catheter placement to facilitate her treatment goals.   Denies fever, chills, chest pain, abdominal pain, nausea, vomiting, diarrhea Reports fatigue, headache, dizziness, shortness of breath  Patient is FULL Code.  Past Medical History:  Diagnosis Date   Allergy  09/30/1996   Arthritis    Asthma    Complication of anesthesia    COPD (chronic obstructive pulmonary disease) (HCC)    Diabetes (HCC)    High cholesterol    History of radiation therapy    Right Lung- 12/04/21-12/11/21- Dr. Lynwood Nasuti   Hypertension    Lung cancer [facility])    Neuromuscular disorder (HCC)    Pneumonia 09/10/2017   PONV (postoperative nausea and vomiting)    Stroke (HCC) 2007   mini strokes    Past Surgical History:  Procedure Laterality Date   ABDOMINAL HYSTERECTOMY  1995   BRONCHIAL BIOPSY  10/23/2021   Procedure: BRONCHIAL BIOPSIES;  Surgeon: Brenna Adine CROME, DO;  Location: [facility];  Service: Pulmonary;;   BRONCHIAL BRUSHINGS  10/23/2021   Procedure: BRONCHIAL BRUSHINGS;  Surgeon: Brenna Adine CROME, DO;  Location:  [facility];  Service: Pulmonary;;   BRONCHIAL NEEDLE ASPIRATION BIOPSY  10/23/2021   Procedure: BRONCHIAL NEEDLE ASPIRATION BIOPSIES;  Surgeon: Brenna Adine CROME, DO;  Location: [facility];  Service: Pulmonary;;   carpel tunnel surgery Right 2006   COLONOSCOPY  2014   COLOSTOMY  2007   colostomy let down  2008   endoscopy  2014   FIDUCIAL MARKER PLACEMENT  10/23/2021   Procedure: FIDUCIAL MARKER PLACEMENT;  Surgeon: Brenna Adine CROME, DO;  Location: [facility];  Service: Pulmonary;;   rotator cuff tear Left 04/14/2020   TUBAL LIGATION     VIDEO BRONCHOSCOPY WITH ENDOBRONCHIAL NAVIGATION Right 10/23/2021   Procedure: VIDEO BRONCHOSCOPY WITH ENDOBRONCHIAL NAVIGATION;  Surgeon: Brenna Adine CROME, DO;  Location: [facility];  Service: Pulmonary;  Laterality: Right;  ION, w/ fiducial placement   VIDEO BRONCHOSCOPY WITH RADIAL ENDOBRONCHIAL ULTRASOUND  10/23/2021   Procedure: VIDEO BRONCHOSCOPY WITH RADIAL ENDOBRONCHIAL ULTRASOUND;  Surgeon: Brenna Adine CROME, DO;  Location: [facility];  Service: Pulmonary;;    Allergies: Other  Medications: Prior to Admission medications   Medication Sig Start Date End Date Taking? Authorizing Provider  albuterol  (VENTOLIN  HFA) 108 (90 Base) MCG/ACT inhaler Inhale 2 puffs into the lungs every 4 (four) hours as needed for wheezing or shortness of breath. 08/12/24   Darlean Ozell NOVAK, MD  arformoterol  (BROVANA ) 15 MCG/2ML NEBU Take 2 mLs (15 mcg total) by nebulization 2 (two) times daily. 08/12/24   Darlean Ozell NOVAK, MD  aspirin  81 MG tablet Take 81 mg by mouth daily.    [provider]  atorvastatin  (LIPITOR)  80 MG tablet TAKE 1 TABLET BY MOUTH EVERY DAY MONDAY THROUGH FRIDAY 09/27/24   Jarold Medici, MD  benzonatate  (TESSALON ) 200 MG capsule Take 1 capsule (200 mg total) by mouth 3 (three) times daily. 07/04/24   Barbarann Nest, MD  Biotin 89999 MCG TBDP Take 10,000 mcg by mouth daily.    [provider]  bisoprolol  (ZEBETA ) 5 MG tablet TAKE  1/2 TABLET BY MOUTH EVERY DAY 12/04/23   Jarold Medici, MD  budesonide  (PULMICORT ) 0.5 MG/2ML nebulizer solution Take 2 mLs (0.5 mg total) by nebulization 2 (two) times daily. 08/12/24   Darlean Ozell NOVAK, MD  CALCIUM -MAGNESIUM -ZINC PO Take 1 capsule by mouth at bedtime.    [provider]  Cholecalciferol (VITAMIN D3) 2000 UNITS TABS Take 2,000 Units by mouth at bedtime.    [provider]  DULoxetine  (CYMBALTA ) 60 MG capsule TAKE 1 CAPSULE(60 MG) BY MOUTH DAILY 09/09/24   Jarold Medici, MD  famotidine  (PEPCID ) 20 MG tablet Take 1 tablet (20 mg total) by mouth daily after supper. 10/11/23   Olalere, Jennet LABOR, MD  folic acid  (FOLVITE ) 1 MG tablet Take 1 tablet (1 mg total) by mouth daily. Start 7 days before pemetrexed chemotherapy. Continue until 21 days after pemetrexed completed. 11/23/24   Sherrod Sherrod, MD  ipratropium-albuterol  (DUONEB) 0.5-2.5 (3) MG/3ML SOLN Take 3 mLs by nebulization every 4 (four) hours as needed. 07/04/24   Barbarann Nest, MD  lidocaine -prilocaine  (EMLA ) cream Apply to affected area once 11/23/24   Sherrod Sherrod, MD  Magnesium  250 MG TABS Take 1 tablet by mouth daily.    [provider]  methocarbamol  (ROBAXIN ) 500 MG tablet Take 1 tablet (500 mg total) by mouth every 8 (eight) hours as needed for muscle spasms. 11/04/24   Georgina Speaks, FNP  montelukast  (SINGULAIR ) 10 MG tablet TAKE 1 TABLET(10 MG) BY MOUTH AT BEDTIME 10/31/23   Olalere, Adewale A, MD  ondansetron  (ZOFRAN ) 8 MG tablet Take 1 tablet (8 mg total) by mouth every 8 (eight) hours as needed for nausea or vomiting. Start on the third day after carboplatin. 11/23/24   Sherrod Sherrod, MD  oxyCODONE -acetaminophen  (PERCOCET) 10-325 MG tablet Take 1 tablet by mouth every 6 (six) hours as needed for pain. 11/04/24 11/04/25  Georgina Speaks, FNP  pantoprazole  (PROTONIX ) 40 MG tablet TAKE 1 TABLET BY MOUTH 30 MINUTES BEFORE FIRST MEAL OF THE DAY 02/09/24   Jarold Medici, MD  predniSONE   (DELTASONE ) 10 MG tablet 2 each am until better then 1 each am x 5 days and stop Patient not taking: Reported on 12/02/2024 07/07/24   Darlean Ozell NOVAK, MD  pregabalin  (LYRICA ) 100 MG capsule Take 1 capsule (100 mg total) by mouth 3 (three) times daily. TAKE 1 CAPSULE(100 MG) BY MOUTH THREE TIMES DAILY 11/08/24   Jarold Medici, MD  prochlorperazine  (COMPAZINE ) 10 MG tablet Take 1 tablet (10 mg total) by mouth every 6 (six) hours as needed for nausea or vomiting. 11/23/24   Sherrod Sherrod, MD  promethazine  (PHENERGAN ) 25 MG tablet Take 25 mg by mouth every 6 (six) hours as needed for nausea or vomiting. 04/24/24   [provider]  REPATHA  SURECLICK 140 MG/ML SOAJ ADMINISTER 1 ML UNDER THE SKIN EVERY 14 DAYS 09/03/24   Hilty, Vinie BROCKS, MD  Semaglutide , 1 MG/DOSE, (OZEMPIC , 1 MG/DOSE,) 4 MG/3ML SOPN Inject 1 mg into the skin once a week. 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  Tobacco Use   Smoking status: Former    Current packs/day: 0.00    Average packs/day: 0.5 packs/day for 42.0 years (21.0 ttl pk-yrs)    Types: Cigarettes    Start date: 01/30/1967    Quit date: 01/30/2006    Years since quitting: 18.8   Smokeless tobacco: Never   Tobacco comments:    intermittent cycles b/w 1/4-1/2ppd  Vaping Use   Vaping status: Never Used  Substance and Sexual Activity   Alcohol use: Not Currently    Comment: 1 drink a year, maybe    Drug use: No   Sexual activity: Not Currently    Birth control/protection: Abstinence, None  Other Topics Concern   Not on file  Social History Narrative   Not on file   Social Drivers of Health   Financial Resource Strain: Medium Risk (09/13/2024)   Overall Financial Resource Strain (CARDIA)    Difficulty of Paying Living Expenses: Somewhat hard  Food Insecurity: No Food Insecurity (11/10/2024)   Hunger Vital Sign    Worried About Running Out of Food in the Last Year: Never true    Ran Out of Food in the Last Year: Never true  Transportation Needs: No Transportation Needs (11/10/2024)   PRAPARE - Administrator, Civil Service (Medical): No    Lack of Transportation (Non-Medical): No  Physical Activity: Inactive (09/13/2024)   Exercise Vital Sign    Days of Exercise per Week: 0 days    Minutes of Exercise per Session: Not on file  Stress: Stress Concern Present (09/13/2024)   [employer] of Occupational Health - Occupational Stress Questionnaire    Feeling of Stress: To some extent  Social Connections: Moderately Integrated (09/13/2024)   Social Connection and Isolation Panel    Frequency of Communication with Friends and Family: Once a week    Frequency of Social Gatherings with Friends and Family: Once a week    Attends Religious Services: More than 4 times per year    Active Member of [employer] or Organizations: Yes    Attends Engineer, Structural: More than 4 times per year    Marital Status: Married    Review of Systems: A 12 point ROS discussed and pertinent positives are indicated in the HPI above.  All other systems are negative.    Vital Signs: BP 138/74 (BP Location: Left Arm)   Pulse 96   Temp 98.3 F (36.8 C) (Oral)   Resp 16   SpO2 96%   Physical Exam HENT:     Mouth/Throat:     Mouth: Mucous membranes are moist.  Cardiovascular:     Rate and Rhythm: Normal rate.  Pulmonary:     Effort: Pulmonary effort is normal. No respiratory  distress.  Skin:    General: Skin is warm and dry.  Neurological:     Mental Status: She is alert and  oriented to person, place, and time.  Psychiatric:        Mood and Affect: Mood normal.        Behavior: Behavior normal.     Labs:  CBC: Recent Labs    07/01/24 0523 07/03/24 0535 11/10/24 1111 11/23/24 1115  WBC 10.6* 11.0* 6.6 7.3  HGB 11.7* 12.2 11.1* 10.7*  HCT 36.6 38.1 34.8* 33.2*  PLT 388 406* 491* 489*    COAGS: No results for input(s): INR, APTT in the last 8760 hours.  BMP: Recent Labs    07/03/24 0535 10/18/24 1209 11/10/24 1111 11/23/24 1115  NA 139 140 140 141  K 4.3 3.8 4.2 4.1  CL 98 104 105 104  CO2 32 32 30 30  GLUCOSE 189* 145* 96 73  BUN 16 8 7* 10  CALCIUM  9.0 9.5 9.4 9.4  CREATININE 0.60 0.80 0.71 0.76  GFRNONAA >60 >60 >60 >60    LIVER FUNCTION TESTS: Recent Labs    06/29/24 0549 10/18/24 1209 11/10/24 1111 11/23/24 1115  BILITOT 0.7 0.4 0.4 0.4  AST 38 22 22 30   ALT 15 13 13 15   ALKPHOS 49 63 68 72  PROT 7.6 6.8 7.2 7.0  ALBUMIN 3.7 3.7 3.8 3.9    TUMOR MARKERS: No results for input(s): AFPTM, CEA, CA199, CHROMGRNA in the last 8760 hours.  Assessment and Plan:  Metastatic lung cancer; pending chemotherapy: [name], 78 year old female, presents today for an image-guided port-a-catheter placement.   Risks and benefits of image-guided port-a-catheter placement were discussed with the patient including, but not limited to bleeding, infection, pneumothorax, or fibrin sheath development and need for additional procedures.  All of the patient's questions were answered, patient is agreeable to proceed. She has been NPO.   Consent signed and in chart.  Thank you for this interesting consult.  I greatly enjoyed meeting [name] and look forward to participating in their care.  A copy of this report was sent to the requesting provider on this date.  Electronically Signed: Warren Dais,  AGACNP-BC 12/03/2024, 12:45 PM   I spent a total of  30 Minutes   in face to face in clinical consultation, greater than 50% of which was counseling/coordinating care for metastatic lung cancer.

## 2024-12-03 ENCOUNTER — Inpatient Hospital Stay
Admission: RE | Admit: 2024-12-03 | Discharge: 2024-12-03 | Disposition: A | Source: Ambulatory Visit | Attending: Internal Medicine | Admitting: Internal Medicine

## 2024-12-03 ENCOUNTER — Telehealth: Payer: Self-pay | Admitting: Internal Medicine

## 2024-12-03 ENCOUNTER — Encounter: Payer: Self-pay | Admitting: Internal Medicine

## 2024-12-03 DIAGNOSIS — C799 Secondary malignant neoplasm of unspecified site: Secondary | ICD-10-CM

## 2024-12-03 DIAGNOSIS — C50919 Malignant neoplasm of unspecified site of unspecified female breast: Secondary | ICD-10-CM | POA: Diagnosis not present

## 2024-12-03 HISTORY — PX: IR IMAGING GUIDED PORT INSERTION: IMG5740

## 2024-12-03 MED ORDER — FENTANYL CITRATE (PF) 100 MCG/2ML IJ SOLN
INTRAMUSCULAR | Status: DC | PRN
  Administered 2024-12-03 (×3): 50 ug via INTRAVENOUS

## 2024-12-03 MED ORDER — FENTANYL CITRATE (PF) 50 MCG/ML IJ SOSY: 25.0000 ug | PREFILLED_SYRINGE | INTRAMUSCULAR | Status: DC | PRN

## 2024-12-03 MED ORDER — MIDAZOLAM HCL (PF) 2 MG/2ML IJ SOLN
INTRAMUSCULAR | Status: DC | PRN
  Administered 2024-12-03 (×2): 1 mg via INTRAVENOUS

## 2024-12-03 MED ORDER — MIDAZOLAM HCL (PF) 2 MG/2ML IJ SOLN: 1.0000 mg | INTRAMUSCULAR | Status: DC | PRN

## 2024-12-03 MED ORDER — LIDOCAINE-EPINEPHRINE 1 %-1:100000 IJ SOLN
20.0000 mL | Freq: Once | INTRAMUSCULAR | Status: AC
  Administered 2024-12-03: 30 mL via INTRADERMAL

## 2024-12-03 MED ORDER — SODIUM CHLORIDE 0.9 % IV SOLN: INTRAVENOUS | Status: DC

## 2024-12-03 MED ORDER — HEPARIN SOD (PORK) LOCK FLUSH 100 UNIT/ML IV SOLN
500.0000 [IU] | Freq: Once | INTRAVENOUS | Status: AC
  Administered 2024-12-03: 500 [IU]

## 2024-12-03 NOTE — Telephone Encounter (Signed)
 Scheduled patient for next appointment. Called and left a voicemail with the appointment day and time.

## 2024-12-03 NOTE — Procedures (Signed)
 Interventional Radiology Procedure Note  Procedure: Single Lumen Power Port Placement    Access:  Right internal jugular vein  Findings: Catheter tip positioned at cavoatrial junction. Port is ready for immediate use.   Complications: None  EBL: < 10 mL  Recommendations:  - Ok to shower in 24 hours - Do not submerge for 7 days - Routine line care    Ester Sides, MD

## 2024-12-05 DIAGNOSIS — S46011A Strain of muscle(s) and tendon(s) of the rotator cuff of right shoulder, initial encounter: Secondary | ICD-10-CM | POA: Diagnosis not present

## 2024-12-05 DIAGNOSIS — M25511 Pain in right shoulder: Secondary | ICD-10-CM | POA: Diagnosis not present

## 2024-12-06 ENCOUNTER — Encounter: Payer: Self-pay | Admitting: Internal Medicine

## 2024-12-06 ENCOUNTER — Telehealth: Payer: Self-pay

## 2024-12-06 ENCOUNTER — Other Ambulatory Visit: Payer: Self-pay | Admitting: Physician Assistant

## 2024-12-06 NOTE — Progress Notes (Signed)
 See telephone note

## 2024-12-06 NOTE — Progress Notes (Unsigned)
 [facility] OFFICE PROGRESS NOTE  Jarold Medici, MD [address] [place] [address] [place] [address]  DIAGNOSIS: Sstage IV (TX, N2, M1 C) non-small cell lung cancer, adenocarcinoma presented with right hilar lymphadenopathy as well as left parotid gland metastasis in addition to liver, pancreatic tail and muscular metastasis diagnosed in October 2025.    Biomarker Findings Tumor Mutational Burden - 12 Muts/Mb HRD signature - HRDsig Negative Microsatellite status - MS-Stable Genomic Findings For a complete list of the genes assayed, please refer to the Appendix. KRAS G12C AKT2 amplification CCNE1 amplification PTEN R130* - subclonal? MCL1 amplification TP53 V157F 7 Disease relevant genes with no reportable alterations: ALK, BRAF, EGFR, ERBB2, MET, RET, ROS1   PDL1 TPS 0% cMET Expression: 0%  PRIOR THERAPY: None  CURRENT THERAPY: Carboplatin for an AUC of 5, Alimta 500 mg/m, Keytruda 200 mg IV every 3 weeks.  First dose on 12/09/24  INTERVAL HISTORY: Dimitra [place] 78 y.o. female returns to the clinic today for follow-up visit.  The patient establish care in the clinic with Dr. Sherrod on 11/10/2024.  Patient is here today prior to undergoing her first cycle of chemotherapy.  She had her chemo education class and does not have any questions.   In the interval since being seen she established care with Dr. Buckley due to the abnormalities on her stagin brain MRI. He recommended follow up brain MRI in 4-6 weeks.   ***Biopsy the parotid lesion???  She also had a Port-A-Cath placed in the interval which she tolerated ***  The patient denies any concerning complaints since she was last seen.  She denies any fever, chills, night sweats, or unexplained weight loss.  She denies any chest pain, shortness of breath, cough, or hemoptysis.  Denies any nausea, vomiting, diarrhea, or constipation.  Denies any headache or visual changes.  Denies any rashes or skin  changes.  She is here today for evaluation and repeat blood work before undergoing cycle #1.     MEDICAL HISTORY: Past Medical History:  Diagnosis Date   Allergy  09/30/1996   Arthritis    Asthma    Complication of anesthesia    COPD (chronic obstructive pulmonary disease) (HCC)    Diabetes (HCC)    High cholesterol    History of radiation therapy    Right Lung- 12/04/21-12/11/21- Dr. Lynwood Nasuti   Hypertension    Lung cancer [facility])    Neuromuscular disorder (HCC)    Pneumonia 09/10/2017   PONV (postoperative nausea and vomiting)    Stroke (HCC) 2007   mini strokes    ALLERGIES:  is allergic to other.  MEDICATIONS:  Current Outpatient Medications  Medication Sig Dispense Refill   albuterol  (VENTOLIN  HFA) 108 (90 Base) MCG/ACT inhaler Inhale 2 puffs into the lungs every 4 (four) hours as needed for wheezing or shortness of breath. 18 g 12   arformoterol  (BROVANA ) 15 MCG/2ML NEBU Take 2 mLs (15 mcg total) by nebulization 2 (two) times daily. 120 mL 12   aspirin  81 MG tablet Take 81 mg by mouth daily.     atorvastatin  (LIPITOR) 80 MG tablet TAKE 1 TABLET BY MOUTH EVERY DAY MONDAY THROUGH FRIDAY 90 tablet 1   benzonatate  (TESSALON ) 200 MG capsule Take 1 capsule (200 mg total) by mouth 3 (three) times daily. 20 capsule 0   Biotin 89999 MCG TBDP Take 10,000 mcg by mouth daily.     bisoprolol  (ZEBETA ) 5 MG tablet TAKE 1/2 TABLET BY MOUTH EVERY DAY 45 tablet 3  budesonide  (PULMICORT ) 0.5 MG/2ML nebulizer solution Take 2 mLs (0.5 mg total) by nebulization 2 (two) times daily. 120 mL 12   CALCIUM -MAGNESIUM -ZINC PO Take 1 capsule by mouth at bedtime.     Cholecalciferol (VITAMIN D3) 2000 UNITS TABS Take 2,000 Units by mouth at bedtime.     DULoxetine  (CYMBALTA ) 60 MG capsule TAKE 1 CAPSULE(60 MG) BY MOUTH DAILY 90 capsule 1   famotidine  (PEPCID ) 20 MG tablet Take 1 tablet (20 mg total) by mouth daily after supper. 90 tablet 3   folic acid  (FOLVITE ) 1 MG tablet Take 1 tablet (1 mg total)  by mouth daily. Start 7 days before pemetrexed chemotherapy. Continue until 21 days after pemetrexed completed. 100 tablet 3   ipratropium-albuterol  (DUONEB) 0.5-2.5 (3) MG/3ML SOLN Take 3 mLs by nebulization every 4 (four) hours as needed. 360 mL 0   lidocaine -prilocaine  (EMLA ) cream Apply to affected area once 30 g 3   Magnesium  250 MG TABS Take 1 tablet by mouth daily.     methocarbamol  (ROBAXIN ) 500 MG tablet Take 1 tablet (500 mg total) by mouth every 8 (eight) hours as needed for muscle spasms. 30 tablet 0   montelukast  (SINGULAIR ) 10 MG tablet TAKE 1 TABLET(10 MG) BY MOUTH AT BEDTIME 90 tablet 3   ondansetron  (ZOFRAN ) 8 MG tablet Take 1 tablet (8 mg total) by mouth every 8 (eight) hours as needed for nausea or vomiting. Start on the third day after carboplatin. 30 tablet 1   oxyCODONE -acetaminophen  (PERCOCET) 10-325 MG tablet Take 1 tablet by mouth every 6 (six) hours as needed for pain. 30 tablet 0   pantoprazole  (PROTONIX ) 40 MG tablet TAKE 1 TABLET BY MOUTH 30 MINUTES BEFORE FIRST MEAL OF THE DAY 90 tablet 2   predniSONE  (DELTASONE ) 10 MG tablet 2 each am until better then 1 each am x 5 days and stop (Patient not taking: Reported on 12/02/2024) 100 tablet 2   pregabalin  (LYRICA ) 100 MG capsule Take 1 capsule (100 mg total) by mouth 3 (three) times daily. TAKE 1 CAPSULE(100 MG) BY MOUTH THREE TIMES DAILY 90 capsule 2   prochlorperazine  (COMPAZINE ) 10 MG tablet Take 1 tablet (10 mg total) by mouth every 6 (six) hours as needed for nausea or vomiting. 30 tablet 1   promethazine  (PHENERGAN ) 25 MG tablet Take 25 mg by mouth every 6 (six) hours as needed for nausea or vomiting.     REPATHA  SURECLICK 140 MG/ML SOAJ ADMINISTER 1 ML UNDER THE SKIN EVERY 14 DAYS 6 mL 3   Semaglutide , 1 MG/DOSE, (OZEMPIC , 1 MG/DOSE,) 4 MG/3ML SOPN Inject 1 mg into the skin once a week. Friday 3 mL 3   No current facility-administered medications for this visit.    SURGICAL HISTORY:  Past Surgical History:   Procedure Laterality Date   ABDOMINAL HYSTERECTOMY  1995   BRONCHIAL BIOPSY  10/23/2021   Procedure: BRONCHIAL BIOPSIES;  Surgeon: Brenna Adine CROME, DO;  Location: [facility];  Service: Pulmonary;;   BRONCHIAL BRUSHINGS  10/23/2021   Procedure: BRONCHIAL BRUSHINGS;  Surgeon: Brenna Adine CROME, DO;  Location: [facility];  Service: Pulmonary;;   BRONCHIAL NEEDLE ASPIRATION BIOPSY  10/23/2021   Procedure: BRONCHIAL NEEDLE ASPIRATION BIOPSIES;  Surgeon: Brenna Adine CROME, DO;  Location: [facility];  Service: Pulmonary;;   carpel tunnel surgery Right 2006   COLONOSCOPY  2014   COLOSTOMY  2007   colostomy let down  2008   endoscopy  2014   FIDUCIAL MARKER PLACEMENT  10/23/2021   Procedure: FIDUCIAL MARKER  PLACEMENT;  Surgeon: Brenna Adine CROME, DO;  Location: [facility];  Service: Pulmonary;;   rotator cuff tear Left 04/14/2020   TUBAL LIGATION     VIDEO BRONCHOSCOPY WITH ENDOBRONCHIAL NAVIGATION Right 10/23/2021   Procedure: VIDEO BRONCHOSCOPY WITH ENDOBRONCHIAL NAVIGATION;  Surgeon: Brenna Adine CROME, DO;  Location: [facility];  Service: Pulmonary;  Laterality: Right;  ION, w/ fiducial placement   VIDEO BRONCHOSCOPY WITH RADIAL ENDOBRONCHIAL ULTRASOUND  10/23/2021   Procedure: VIDEO BRONCHOSCOPY WITH RADIAL ENDOBRONCHIAL ULTRASOUND;  Surgeon: Brenna Adine CROME, DO;  Location: [facility];  Service: Pulmonary;;    REVIEW OF SYSTEMS:   Review of Systems  Constitutional: Negative for appetite change, chills, fatigue, fever and unexpected weight change.  HENT:   Negative for mouth sores, nosebleeds, sore throat and trouble swallowing.   Eyes: Negative for eye problems and icterus.  Respiratory: Negative for cough, hemoptysis, shortness of breath and wheezing.   Cardiovascular: Negative for chest pain and leg swelling.  Gastrointestinal: Negative for abdominal pain, constipation, diarrhea, nausea and vomiting.  Genitourinary: Negative for bladder incontinence, difficulty urinating,  dysuria, frequency and hematuria.   Musculoskeletal: Negative for back pain, gait problem, neck pain and neck stiffness.  Skin: Negative for itching and rash.  Neurological: Negative for dizziness, extremity weakness, gait problem, headaches, light-headedness and seizures.  Hematological: Negative for adenopathy. Does not bruise/bleed easily.  Psychiatric/Behavioral: Negative for confusion, depression and sleep disturbance. The patient is not nervous/anxious.     PHYSICAL EXAMINATION:  There were no vitals taken for this visit.  ECOG PERFORMANCE STATUS: {CHL ONC ECOG [id number]  Physical Exam  Constitutional: Oriented to person, place, and time and well-developed, well-nourished, and in no distress. No distress.  HENT:  Head: Normocephalic and atraumatic.  Mouth/Throat: Oropharynx is clear and moist. No oropharyngeal exudate.  Eyes: Conjunctivae are normal. Right eye exhibits no discharge. Left eye exhibits no discharge. No scleral icterus.  Neck: Normal range of motion. Neck supple.  Cardiovascular: Normal rate, regular rhythm, normal heart sounds and intact distal pulses.   Pulmonary/Chest: Effort normal and breath sounds normal. No respiratory distress. No wheezes. No rales.  Abdominal: Soft. Bowel sounds are normal. Exhibits no distension and no mass. There is no tenderness.  Musculoskeletal: Normal range of motion. Exhibits no edema.  Lymphadenopathy:    No cervical adenopathy.  Neurological: Alert and oriented to person, place, and time. Exhibits normal muscle tone. Gait normal. Coordination normal.  Skin: Skin is warm and dry. No rash noted. Not diaphoretic. No erythema. No pallor.  Psychiatric: Mood, memory and judgment normal.  Vitals reviewed.  LABORATORY DATA: Lab Results  Component Value Date   WBC 7.3 11/23/2024   HGB 10.7 (L) 11/23/2024   HCT 33.2 (L) 11/23/2024   MCV 80.2 11/23/2024   PLT 489 (H) 11/23/2024      Chemistry      Component Value Date/Time    NA 141 11/23/2024 1115   NA 139 05/31/2024 1240   K 4.1 11/23/2024 1115   CL 104 11/23/2024 1115   CO2 30 11/23/2024 1115   BUN 10 11/23/2024 1115   BUN 11 05/31/2024 1240   CREATININE 0.76 11/23/2024 1115   GLU 90 03/18/2022 0000      Component Value Date/Time   CALCIUM  9.4 11/23/2024 1115   ALKPHOS 72 11/23/2024 1115   AST 30 11/23/2024 1115   ALT 15 11/23/2024 1115   BILITOT 0.4 11/23/2024 1115       RADIOGRAPHIC STUDIES:  MR BRAIN W WO CONTRAST Result Date: 11/21/2024  CLINICAL DATA:  Non-small cell lung cancer.  Staging. EXAM: MRI HEAD WITHOUT AND WITH CONTRAST TECHNIQUE: Multiplanar, multiecho pulse sequences of the brain and surrounding structures were obtained without and with intravenous contrast. CONTRAST:  8mL GADAVIST  GADOBUTROL  1 MMOL/ML IV SOLN COMPARISON:  Head CT 04/23/2017.  MRI 12/28/2007. FINDINGS: Brain: Diffusion imaging does not show any acute or subacute infarction or other cause of abnormal diffusion with respect to the brain. There chronic small-vessel ischemic changes of the pons. No focal cerebellar finding. Cerebral hemispheres show moderate to severe chronic small-vessel ischemic changes of the white matter. No cortical or large vessel territory stroke. Single 3 x 4 mm enhancing focus within the anterior body of the caudate nucleus on the right could represent a solitary metastasis. The differential diagnosis would be subacute stroke. One could consider a repeat study in 4-6 weeks to see if this has changed. No hydrocephalus. No extra-axial collection. Vascular: Major vessels at the base of the brain show flow. Skull and upper cervical spine: Negative Sinuses/Orbits: Clear/normal Other: Redemonstration of a 2.3 cm enhancing mass, possibly with central necrosis, at the junction of the lateral aspect of the left masseter muscle and the anterior aspect of the superficial lobe of the parotid gland. Given the other clinical findings, this could represent a metastasis.  Primary parotid neoplasm would also be possible. IMPRESSION: 1. Single 3 x 4 mm enhancing focus within the anterior body of the caudate nucleus on the right. This could represent a solitary metastasis. The differential diagnosis would be subacute stroke. One could consider a repeat study in 4-6 weeks to see if this has changed. 2. 2.3 cm enhancing mass at the junction of the lateral aspect of the left masseter muscle and the anterior aspect of the superficial lobe of the parotid gland. Given the other clinical findings, this could represent a metastasis. Primary parotid neoplasm would also be possible. 3. Chronic small-vessel ischemic changes of the pons and cerebral hemispheric white matter. Electronically Signed   By: Oneil Officer M.D.   On: 11/21/2024 09:19     ASSESSMENT/PLAN:  This is a very pleasant 78 year old African-American female with stage IV (TX, N2, M1 C) non-small cell lung cancer, adenocarcinoma.  The patient presented with right hilar lymphadenopathy as well as a left parotid gland metastasis in addition to liver, pancreatic tail, and muscular metastasis.  She was diagnosed in October 2025.  Her molecular studies show she is positive for K-ras G 12C mutation and negative PD-L1 expression.  She also has negative c-Met overexpression.  She is scheduled undergo her first cycle of chemotherapy and immunotherapy today with carboplatin for an AUC of 5, Alimta 500 mg/m, Keytruda 200 mg IV every 3 weeks.  Labs were reviewed.  Recommend that she ***cycle #1 today scheduled.  We will see her back for labs and a follow-up visit in 1 week for toxicity check.  Parotid gland?  She scheduled for repeat brain MRI in January to follow-up.    The patient was advised to call immediately if she has any concerning symptoms in the interval. The patient voices understanding of current disease status and treatment options and is in agreement with the current care plan. All questions were answered.  The patient knows to call the clinic with any problems, questions or concerns. We can certainly see the patient much sooner if necessary    No orders of the defined types were placed in this encounter.    I spent {CHL ONC TIME VISIT - DTPQU:[id number]} counseling  the patient face to face. The total time spent in the appointment was {CHL ONC TIME VISIT - DTPQU:[id number]}.  Kyndel Egger L Amariona Rathje, PA-C 12/06/24

## 2024-12-07 ENCOUNTER — Inpatient Hospital Stay

## 2024-12-07 ENCOUNTER — Encounter

## 2024-12-07 ENCOUNTER — Telehealth: Payer: Self-pay | Admitting: *Deleted

## 2024-12-07 NOTE — Telephone Encounter (Signed)
 Called & left message regarding appts for this Thursday & said to be here 10-15 min for 0830 for labs & will have education after that & see provider & get chemo.  Informed to call back if that doesn't work for her.

## 2024-12-08 NOTE — Progress Notes (Signed)
 Pharmacist Chemotherapy Monitoring - Initial Assessment    Anticipated start date: 12/09/2024   The following has been reviewed per standard work regarding the patient's treatment regimen: The patient's diagnosis, treatment plan and drug doses, and organ/hematologic function Lab orders and baseline tests specific to treatment regimen  The treatment plan start date, drug sequencing, and pre-medications Prior authorization status  Patient's documented medication list, including drug-drug interaction screen and prescriptions for anti-emetics and supportive care specific to the treatment regimen The drug concentrations, fluid compatibility, administration routes, and timing of the medications to be used The patient's access for treatment and lifetime cumulative dose history, if applicable  The patient's medication allergies and previous infusion related reactions, if applicable   Changes made to treatment plan:  N/A  Follow up needed:  N/A   Marlee Eleanor Neighbors, RPH, 12/08/2024  9:39 AM

## 2024-12-09 ENCOUNTER — Inpatient Hospital Stay

## 2024-12-09 ENCOUNTER — Inpatient Hospital Stay: Admitting: Physician Assistant

## 2024-12-09 VITALS — BP 126/70 | HR 81 | Temp 97.8°F | Resp 16 | Ht 65.0 in | Wt 167.0 lb

## 2024-12-09 DIAGNOSIS — C3491 Malignant neoplasm of unspecified part of right bronchus or lung: Secondary | ICD-10-CM | POA: Diagnosis not present

## 2024-12-09 DIAGNOSIS — Z5111 Encounter for antineoplastic chemotherapy: Secondary | ICD-10-CM | POA: Diagnosis not present

## 2024-12-09 DIAGNOSIS — Z5112 Encounter for antineoplastic immunotherapy: Secondary | ICD-10-CM | POA: Insufficient documentation

## 2024-12-09 LAB — CBC WITH DIFFERENTIAL (CANCER CENTER ONLY)
Abs Immature Granulocytes: 0.01 K/uL (ref 0.00–0.07)
Basophils Absolute: 0 K/uL (ref 0.0–0.1)
Basophils Relative: 1 %
Eosinophils Absolute: 0.1 K/uL (ref 0.0–0.5)
Eosinophils Relative: 2 %
HCT: 30.5 % — ABNORMAL LOW (ref 36.0–46.0)
Hemoglobin: 9.9 g/dL — ABNORMAL LOW (ref 12.0–15.0)
Immature Granulocytes: 0 %
Lymphocytes Relative: 27 %
Lymphs Abs: 1.3 K/uL (ref 0.7–4.0)
MCH: 25.5 pg — ABNORMAL LOW (ref 26.0–34.0)
MCHC: 32.5 g/dL (ref 30.0–36.0)
MCV: 78.6 fL — ABNORMAL LOW (ref 80.0–100.0)
Monocytes Absolute: 0.5 K/uL (ref 0.1–1.0)
Monocytes Relative: 10 %
Neutro Abs: 3 K/uL (ref 1.7–7.7)
Neutrophils Relative %: 60 %
Platelet Count: 413 K/uL — ABNORMAL HIGH (ref 150–400)
RBC: 3.88 MIL/uL (ref 3.87–5.11)
RDW: 17.3 % — ABNORMAL HIGH (ref 11.5–15.5)
WBC Count: 4.9 K/uL (ref 4.0–10.5)
nRBC: 0 % (ref 0.0–0.2)

## 2024-12-09 LAB — CMP (CANCER CENTER ONLY)
ALT: 13 U/L (ref 0–44)
AST: 28 U/L (ref 15–41)
Albumin: 3.9 g/dL (ref 3.5–5.0)
Alkaline Phosphatase: 73 U/L (ref 38–126)
Anion gap: 9 (ref 5–15)
BUN: 15 mg/dL (ref 8–23)
CO2: 29 mmol/L (ref 22–32)
Calcium: 9.5 mg/dL (ref 8.9–10.3)
Chloride: 104 mmol/L (ref 98–111)
Creatinine: 0.84 mg/dL (ref 0.44–1.00)
GFR, Estimated: >60 mL/min (ref >60)
Glucose, Bld: 96 mg/dL (ref 70–99)
Potassium: 4 mmol/L (ref 3.5–5.1)
Sodium: 142 mmol/L (ref 135–145)
Total Bilirubin: 0.3 mg/dL (ref 0.0–1.2)
Total Protein: 7 g/dL (ref 6.5–8.1)

## 2024-12-09 LAB — FERRITIN: Ferritin: 16 ng/mL (ref 11–307)

## 2024-12-09 LAB — IRON AND IRON BINDING CAPACITY (CC-WL,HP ONLY)
Iron: 31 ug/dL (ref 28–170)
Saturation Ratios: 8 % — ABNORMAL LOW (ref 10.4–31.8)
TIBC: 384 ug/dL (ref 250–450)
UIBC: 353 ug/dL

## 2024-12-09 LAB — TSH: TSH: 0.934 u[IU]/mL (ref 0.350–4.500)

## 2024-12-09 MED ORDER — PALONOSETRON HCL INJECTION 0.25 MG/5ML
0.2500 mg | Freq: Once | INTRAVENOUS | Status: AC
  Administered 2024-12-09: 0.25 mg via INTRAVENOUS
  Filled 2024-12-09: qty 5

## 2024-12-09 MED ORDER — SODIUM CHLORIDE 0.9 % IV SOLN: INTRAVENOUS | Status: DC

## 2024-12-09 MED ORDER — DEXAMETHASONE SOD PHOSPHATE PF 10 MG/ML IJ SOLN
10.0000 mg | Freq: Once | INTRAMUSCULAR | Status: AC
  Administered 2024-12-09: 10 mg via INTRAVENOUS

## 2024-12-09 MED ORDER — SODIUM CHLORIDE 0.9 % IV SOLN
413.5000 mg | Freq: Once | INTRAVENOUS | Status: AC
  Administered 2024-12-09: 410 mg via INTRAVENOUS
  Filled 2024-12-09: qty 41

## 2024-12-09 MED ORDER — SODIUM CHLORIDE 0.9 % IV SOLN
500.0000 mg/m2 | Freq: Once | INTRAVENOUS | Status: AC
  Administered 2024-12-09: 900 mg via INTRAVENOUS
  Filled 2024-12-09: qty 20

## 2024-12-09 MED ORDER — SODIUM CHLORIDE 0.9% FLUSH: 10.0000 mL | INTRAVENOUS | Status: DC | PRN

## 2024-12-09 MED ORDER — APREPITANT 130 MG/18ML IV EMUL
130.0000 mg | Freq: Once | INTRAVENOUS | Status: AC
  Administered 2024-12-09: 130 mg via INTRAVENOUS
  Filled 2024-12-09: qty 18

## 2024-12-09 MED ADMIN — CEMIPLIMAB-RWLC CHEMO IV INFUSION: 350 mg | INTRAVENOUS | NDC 61755000801

## 2024-12-09 MED FILL — Cemiplimab-rwlc IV Soln 350 MG/7ML (50 MG/ML): 350.0000 mg | INTRAVENOUS | Qty: 7 | Status: AC

## 2024-12-09 NOTE — Patient Instructions (Signed)
 [facility] - A DEPT OF [facility]  Discharge Instructions: Thank you for choosing [facility] to provide your oncology and hematology care.   If you have a lab appointment with the Cancer Center, please go directly to the Cancer Center and check in at the registration area.   Wear comfortable clothing and clothing appropriate for easy access to any Portacath or PICC line.   We strive to give you quality time with your provider. You may need to reschedule your appointment if you arrive late (15 or more minutes).  Arriving late affects you and other patients whose appointments are after yours.  Also, if you miss three or more appointments without notifying the office, you may be dismissed from the clinic at the providers discretion.      For prescription refill requests, have your pharmacy contact our office and allow 72 hours for refills to be completed.    Today you received the following chemotherapy and/or immunotherapy agents alimta, carboplatin, libtayo      To help prevent nausea and vomiting after your treatment, we encourage you to take your nausea medication as directed.  BELOW ARE SYMPTOMS THAT SHOULD BE REPORTED IMMEDIATELY: *FEVER GREATER THAN 100.4 F (38 C) OR HIGHER *CHILLS OR SWEATING *NAUSEA AND VOMITING THAT IS NOT CONTROLLED WITH YOUR NAUSEA MEDICATION *UNUSUAL SHORTNESS OF BREATH *UNUSUAL BRUISING OR BLEEDING *URINARY PROBLEMS (pain or burning when urinating, or frequent urination) *BOWEL PROBLEMS (unusual diarrhea, constipation, pain near the anus) TENDERNESS IN MOUTH AND THROAT WITH OR WITHOUT PRESENCE OF ULCERS (sore throat, sores in mouth, or a toothache) UNUSUAL RASH, SWELLING OR PAIN  UNUSUAL VAGINAL DISCHARGE OR ITCHING   Items with * indicate a potential emergency and should be followed up as soon as possible or go to the Emergency Department if any problems should occur.  Please show the CHEMOTHERAPY ALERT CARD  or IMMUNOTHERAPY ALERT CARD at check-in to the Emergency Department and triage nurse.  Should you have questions after your visit or need to cancel or reschedule your appointment, please contact [facility] - A DEPT OF [facility]  Dept: [phone]  and follow the prompts.  Office hours are 8:00 a.m. to 4:30 p.m. Monday - Friday. Please note that voicemails left after 4:00 p.m. may not be returned until the following business day.  We are closed weekends and major holidays. You have access to a nurse at all times for urgent questions. Please call the main number to the clinic Dept: [phone] and follow the prompts.   For any non-urgent questions, you may also contact your provider using MyChart. We now offer e-Visits for anyone 34 and older to request care online for non-urgent symptoms. For details visit [email or web address].   Also download the MyChart app! Go to the app store, search MyChart, open the app, select [place], and log in with your MyChart username and password.

## 2024-12-10 ENCOUNTER — Other Ambulatory Visit: Payer: Self-pay | Admitting: Physician Assistant

## 2024-12-10 DIAGNOSIS — C3491 Malignant neoplasm of unspecified part of right bronchus or lung: Secondary | ICD-10-CM

## 2024-12-10 LAB — T4: T4, Total: 10 ug/dL (ref 4.5–12.0)

## 2024-12-14 ENCOUNTER — Other Ambulatory Visit: Payer: Self-pay | Admitting: Physician Assistant

## 2024-12-14 ENCOUNTER — Other Ambulatory Visit: Payer: Self-pay

## 2024-12-14 ENCOUNTER — Telehealth: Payer: Self-pay | Admitting: Nurse Practitioner

## 2024-12-14 ENCOUNTER — Telehealth: Payer: Self-pay

## 2024-12-14 DIAGNOSIS — C3491 Malignant neoplasm of unspecified part of right bronchus or lung: Secondary | ICD-10-CM

## 2024-12-14 DIAGNOSIS — J9601 Acute respiratory failure with hypoxia: Secondary | ICD-10-CM

## 2024-12-14 NOTE — Telephone Encounter (Signed)
 Another order placed to discontinue oxygen patient received call that it will be picked up tomorrow ,NFN

## 2024-12-14 NOTE — Telephone Encounter (Signed)
 Copied from CRM #[id number]. Topic: Clinical - Order For Equipment >> Dec 13, 2024  4:04 PM Joesph PARAS wrote: Reason for CRM: Patient is very upset that her oxygen machine is still there and has not been picked up. Patient states our office got us  all into a mess. Patient would like us  to send an order to discontinue oxygen ASAP. Informed and order was sent on 10/10, patient states [employer] says they don't have the order. Please investigate. Patient wants to discontinue oxygen ASAP.

## 2024-12-14 NOTE — Telephone Encounter (Signed)
 Pt called to inquire about having her fingernails done at the nail salon.  Informed pt that while she is on treatment it is advisable to avoid nail salons due to risk of infection. Pt informed it is ok to get regular nail polish but not gel, SNS, or cuticle cutting. Pt voiced understanding.

## 2024-12-15 ENCOUNTER — Ambulatory Visit
Admission: RE | Admit: 2024-12-15 | Discharge: 2024-12-15 | Disposition: A | Discharge status: Home / Self Care | Source: Ambulatory Visit | Attending: Radiation Oncology | Admitting: Radiation Oncology

## 2024-12-15 ENCOUNTER — Encounter: Payer: Self-pay | Admitting: Radiation Oncology

## 2024-12-15 ENCOUNTER — Ambulatory Visit
Admission: RE | Admit: 2024-12-15 | Discharge: 2024-12-15 | Attending: Radiation Oncology | Admitting: Radiation Oncology

## 2024-12-15 VITALS — BP 101/51 | HR 88 | Temp 97.3°F | Resp 20 | Ht 65.0 in | Wt 165.1 lb

## 2024-12-15 DIAGNOSIS — Z7982 Long term (current) use of aspirin: Secondary | ICD-10-CM | POA: Diagnosis not present

## 2024-12-15 DIAGNOSIS — Z7951 Long term (current) use of inhaled steroids: Secondary | ICD-10-CM | POA: Insufficient documentation

## 2024-12-15 DIAGNOSIS — Z791 Long term (current) use of non-steroidal anti-inflammatories (NSAID): Secondary | ICD-10-CM | POA: Diagnosis not present

## 2024-12-15 DIAGNOSIS — Z79899 Other long term (current) drug therapy: Secondary | ICD-10-CM | POA: Diagnosis not present

## 2024-12-15 DIAGNOSIS — Z923 Personal history of irradiation: Secondary | ICD-10-CM | POA: Diagnosis not present

## 2024-12-15 DIAGNOSIS — Z85118 Personal history of other malignant neoplasm of bronchus and lung: Secondary | ICD-10-CM | POA: Insufficient documentation

## 2024-12-15 DIAGNOSIS — K118 Other diseases of salivary glands: Secondary | ICD-10-CM | POA: Diagnosis present

## 2024-12-15 DIAGNOSIS — C3491 Malignant neoplasm of unspecified part of right bronchus or lung: Secondary | ICD-10-CM

## 2024-12-15 NOTE — Progress Notes (Signed)
 [facility] Ne[facility] Location of Tumor / Histology:  Mass Near Parotid  Patient presented  months ago with symptoms of:  fatigue   Biopsies revealed:   Nutrition Status Yes No Comments  Weight changes? [x]  []   Weight loss  Swallowing concerns? []  [x]    PEG? []  [x]     Referrals Yes No Comments  Social Work? []  []    Dentistry? []  []    Swallowing therapy? []  []    Nutrition? []  []    Med/Onc? [x]  []     Safety Issues Yes No Comments  Prior radiation? [x]  []    Pacemaker/ICD? []  [x]    Possible current pregnancy? []  [x]    Is the patient on methotrexate? []  [x]     Tobacco/Marijuana/Snuff/ETOH use: no  Past/Anticipated interventions by otolaryngology, if any:    Past/Anticipated interventions by medical oncology, if any: yes      Current Complaints / other details:    Patient reports feeling lightheaded. Noted BP to be 101/51.   BP (!) 101/51 (BP Location: Left Arm, Patient Position: Sitting)   Pulse 88   Temp (!) 97.3 F (36.3 C) (Temporal)   Resp 20   Ht 5' 5 (1.651 m)   Wt 165 lb 2 oz (74.9 kg)   SpO2 98%   BMI 27.48 kg/m

## 2024-12-22 NOTE — Progress Notes (Signed)
 "  Radiation Oncology         [phone] ________________________________  Name: [name] MRN: [id number]  Date: 12/15/2024  DOB: 20-Nov-1946  Re- evaluation note  CC: Jarold Medici, MD  Sherrod Sherrod, MD    ICD-10-CM   1. 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   Interval Since Last Radiation: 3 years  She received SBRT for a adenocarcinoma presenting in the right upper lobe, clinical stage I with treatments completing 3 years ago.  Routine imaging revealed progressive disease as highlighted below.  Diagnosis:Stage IV (TX, N2, M1 C) non-small cell lung cancer, adenocarcinoma presented with right hilar lymphadenopathy as well as left parotid gland metastasis in addition to liver, pancreatic tail and muscular metastasis diagnosed in October 2025.   CURRENT THERAPY: Carboplatin  for an AUC of 5, Alimta  500 mg/m, Keytruda 200 mg IV every 3 weeks.  First dose on 12/09/24    Biomarker Findings Tumor Mutational Burden - 12 Muts/Mb HRD signature - HRDsig Negative Microsatellite status - MS-Stable Genomic Findings For a complete list of the genes assayed, please refer to the Appendix. KRAS G12C AKT2 amplification CCNE1 amplification PTEN R130* - subclonal MCL1 amplification TP53 V157F 7 Disease relevant genes with no reportable alterations: ALK, BRAF, EGFR, ERBB2, MET, RET, ROS1   PDL1 TPS 0% cMET Expression: 0%  CURRENT THERAPY: Carboplatin  for an AUC of 5, Alimta  500 mg/m, Keytruda 200 mg IV every 3 weeks.  First dose on 12/09/24   Narrative:   Patient is seen today upon the courtesy of [facility] and Dr. Sherrod for consideration for palliative radiation therapy.  She was seen in mid  December and was complaining of some discomfort and difficulty with chewing in light of the soft tissue mass in the left upper neck.                      ALLERGIES:  is allergic to other.  Meds: Current Outpatient Medications  Medication Sig Dispense Refill   albuterol  (VENTOLIN  HFA) 108 (90 Base) MCG/ACT inhaler Inhale 2 puffs into the lungs every 4 (four) hours as needed for wheezing or shortness of breath. 18 g 12   arformoterol  (BROVANA ) 15 MCG/2ML NEBU Take 2 mLs (15 mcg total) by nebulization 2 (two) times daily. 120 mL 12   aspirin  81 MG tablet Take 81 mg by mouth daily.     atorvastatin  (LIPITOR) 80 MG tablet TAKE 1 TABLET BY MOUTH EVERY DAY MONDAY THROUGH FRIDAY 90 tablet 1   Biotin 89999 MCG TBDP Take 10,000 mcg by mouth daily.     bisoprolol  (ZEBETA ) 5 MG tablet TAKE 1/2 TABLET BY MOUTH EVERY DAY 45 tablet 3   budesonide  (PULMICORT ) 0.5 MG/2ML nebulizer solution Take 2 mLs (0.5 mg total) by nebulization 2 (two) times daily. 120 mL 12   CALCIUM -MAGNESIUM -ZINC PO Take 1 capsule by mouth at bedtime.     Cholecalciferol (VITAMIN D3) 2000 UNITS TABS Take 2,000 Units by mouth at bedtime.     DULoxetine  (CYMBALTA ) 60 MG capsule TAKE 1 CAPSULE(60 MG) BY MOUTH DAILY 90 capsule 1   famotidine  (PEPCID ) 20 MG tablet Take 1 tablet (20 mg total) by mouth daily  after supper. 90 tablet 3   folic acid  (FOLVITE ) 1 MG tablet Take 1 tablet (1 mg total) by mouth daily. Start 7 days before pemetrexed  chemotherapy. Continue until 21 days after pemetrexed  completed. 100 tablet 3   ipratropium-albuterol  (DUONEB) 0.5-2.5 (3) MG/3ML SOLN Take 3 mLs by nebulization every 4 (four) hours as needed. 360 mL 0   lidocaine -prilocaine  (EMLA ) cream Apply to affected area once 30 g 3   Magnesium  250 MG TABS Take 1 tablet by mouth daily.     methocarbamol  (ROBAXIN ) 500 MG tablet Take 1 tablet (500 mg total) by mouth every 8 (eight) hours as needed for muscle spasms. 30 tablet 0   montelukast  (SINGULAIR ) 10 MG tablet  TAKE 1 TABLET(10 MG) BY MOUTH AT BEDTIME 90 tablet 3   ondansetron  (ZOFRAN ) 8 MG tablet Take 1 tablet (8 mg total) by mouth every 8 (eight) hours as needed for nausea or vomiting. Start on the third day after carboplatin . 30 tablet 1   oxyCODONE -acetaminophen  (PERCOCET) 10-325 MG tablet Take 1 tablet by mouth every 6 (six) hours as needed for pain. 30 tablet 0   pantoprazole  (PROTONIX ) 40 MG tablet TAKE 1 TABLET BY MOUTH 30 MINUTES BEFORE FIRST MEAL OF THE DAY 90 tablet 2   pregabalin  (LYRICA ) 100 MG capsule Take 1 capsule (100 mg total) by mouth 3 (three) times daily. TAKE 1 CAPSULE(100 MG) BY MOUTH THREE TIMES DAILY 90 capsule 2   prochlorperazine  (COMPAZINE ) 10 MG tablet Take 1 tablet (10 mg total) by mouth every 6 (six) hours as needed for nausea or vomiting. 30 tablet 1   promethazine  (PHENERGAN ) 25 MG tablet Take 25 mg by mouth every 6 (six) hours as needed for nausea or vomiting.     REPATHA  SURECLICK 140 MG/ML SOAJ ADMINISTER 1 ML UNDER THE SKIN EVERY 14 DAYS 6 mL 3   Semaglutide , 1 MG/DOSE, (OZEMPIC , 1 MG/DOSE,) 4 MG/3ML SOPN Inject 1 mg into the skin once a week. Friday 3 mL 3   benzonatate  (TESSALON ) 200 MG capsule Take 1 capsule (200 mg total) by mouth 3 (three) times daily. (Patient not taking: Reported on 12/09/2024) 20 capsule 0   buPROPion (WELLBUTRIN XL) 150 MG 24 hr tablet Take 150 mg by mouth daily.     CELEBREX 200 MG capsule Take 200 mg by mouth daily.     predniSONE  (DELTASONE ) 10 MG tablet 2 each am until better then 1 each am x 5 days and stop (Patient not taking: Reported on 12/09/2024) 100 tablet 2   No current facility-administered medications for this encounter.    Physical Findings: The patient is in no acute distress. Patient is alert and oriented.  height is 5' 5 (1.651 m) and weight is 165 lb 2 oz (74.9 kg). Her temporal temperature is 97.3 F (36.3 C) (abnormal). Her blood pressure is 101/51 (abnormal) and her pulse is 88. Her respiration is 20 and oxygen  saturation is 98%. .  Examination of oral cavity reveals no secondary infection.  Partial in place in the mandible area with 5 teeth remaining anteriorly.  Palpation along the neck feels thickening/ firmness in the left upper jugular area extending up into the left parotid gland region as well as along the mandible.  Estimated areas approximately 3 x 3 cm.  Lab Findings: Lab Results  Component Value Date   WBC 4.9 12/09/2024   HGB 9.9 (L) 12/09/2024   HCT 30.5 (L) 12/09/2024   MCV 78.6 (L) 12/09/2024   PLT 413 (H) 12/09/2024  Radiographic Findings: IR IMAGING GUIDED PORT INSERTION Result Date: 12/07/2024 INDICATION: 78 year old female with history of advanced stage lung cancer requiring central venous access for chemotherapy administration. EXAM: IMPLANTED PORT A CATH PLACEMENT WITH ULTRASOUND AND FLUOROSCOPIC GUIDANCE COMPARISON:  None Available. MEDICATIONS: None ANESTHESIA/SEDATION: None CONTRAST:  None FLUOROSCOPY TIME:  Fifteen mGy reference air kerma COMPLICATIONS: None immediate. PROCEDURE: The procedure, risks, benefits, and alternatives were explained to the patient. Questions regarding the procedure were encouraged and answered. The patient understands and consents to the procedure. The right neck and chest were prepped with chlorhexidine  in a sterile fashion, and a sterile drape was applied covering the operative field. Maximum barrier sterile technique with sterile gowns and gloves were used for the procedure. A timeout was performed prior to the initiation of the procedure. Ultrasound was used to examine the jugular vein which was compressible and free of internal echoes. A skin marker was used to demarcate the planned venotomy and port pocket incision sites. Local anesthesia was provided to these sites and the subcutaneous tunnel track with 1% lidocaine  with 1:100,000 epinephrine . A small incision was created at the jugular access site and blunt dissection was performed of the  subcutaneous tissues. Under ultrasound guidance, the jugular vein was accessed with a 21 ga micropuncture needle and an 0.018 wire was inserted to the superior vena cava. Real-time ultrasound guidance was utilized for vascular access including the acquisition of a permanent ultrasound image documenting patency of the accessed vessel. A 5 Fr micopuncture set was then used, through which a 0.035 Rosen wire was passed under fluoroscopic guidance into the inferior vena cava. An 8 Fr dilator was then placed over the wire. A subcutaneous port pocket was then created along the upper chest wall utilizing a combination of sharp and blunt dissection. The pocket was irrigated with sterile saline, packed with gauze, and observed for hemorrhage. A single lumen plastic power injectable port was chosen for placement. The 8 Fr catheter was tunneled from the port pocket site to the venotomy incision. The port was placed in the pocket. The external catheter was trimmed to appropriate length. The dilator was exchanged for an 8 Fr peel-away sheath under fluoroscopic guidance. The catheter was then placed through the sheath and the sheath was removed. Final catheter positioning was confirmed and documented with a fluoroscopic spot radiograph. The port was accessed with a Huber needle, aspirated, and flushed with heparinized saline. The deep dermal layer of the port pocket incision was closed with interrupted 3-0 Vicryl suture. The skin was opposed with a running subcuticular 4-0 Monocryl suture. Dermabond was then placed over the port pocket and neck incisions. The patient tolerated the procedure well without immediate post procedural complication. FINDINGS: After catheter placement, the tip lies within the superior cavoatrial junction. The catheter aspirates and flushes normally and is ready for immediate use. IMPRESSION: Successful placement of a power injectable Port-A-Cath via the right internal jugular vein. The catheter is ready  for immediate use. Ester Sides, MD [facility] Radiology Electronically Signed   By: Ester Sides M.D.   On: 12/07/2024 09:07    Impression: Stage IV (TX, N2, M1 C) non-small cell lung cancer, adenocarcinoma presented with right hilar lymphadenopathy as well as left parotid gland metastasis in addition to liver, pancreatic tail and muscular metastasis diagnosed in October 2025.   She has initiated systemic therapy and seems to be tolerating this well thus far.  We discussed palliative radiation therapy directed at her left parotid/ upper  neck mass.  We discussed that to cover this area would treat a significant portion of her left parotid gland which could result in xerostomia and taste altercations.  Xerostomia could be persistent after completion of radiation therapy depending on her compensation from the right parotid gland.  We discussed that she may receive potential tumor shrinkage in this area with her systemic therapy.  Given the potential side effects with radiation therapy and potential lasting toxicities such as xerostomia, the patient would like to hold off to see if systemic therapy would cause improvement in his soft tissue mass.  Plan: As needed follow-up in radiation oncology.  The patient will continue with systemic chemotherapy as planned by Dr. Sherrod.  ____________________________________ Lynwood Nasuti, MD     "

## 2024-12-23 NOTE — Progress Notes (Deleted)
 [facility] OFFICE PROGRESS NOTE  Jarold Medici, MD [address] [place] [address] [place] [address]  DIAGNOSIS: Stage IV (TX, N2, M1 C) non-small cell lung cancer, adenocarcinoma presented with right hilar lymphadenopathy as well as left parotid gland metastasis in addition to liver, pancreatic tail and muscular metastasis diagnosed in October 2025.    Biomarker Findings Tumor Mutational Burden - 12 Muts/Mb HRD signature - HRDsig Negative Microsatellite status - MS-Stable Genomic Findings For a complete list of the genes assayed, please refer to the Appendix. KRAS G12C AKT2 amplification CCNE1 amplification PTEN R130* - subclonal MCL1 amplification TP53 V157F 7 Disease relevant genes with no reportable alterations: ALK, BRAF, EGFR, ERBB2, MET, RET, ROS1   PDL1 TPS 0% cMET Expression: 0%    PRIOR THERAPY: None  CURRENT THERAPY: Carboplatin  for an AUC of 5, Alimta  500 mg/m, Keytruda 200 mg IV every 3 weeks.  First dose on 12/09/24. Status post 1 cycle.   INTERVAL HISTORY: [name] 78 y.o. female returns to the clinic today for a follow up visit. She was last seen in the clinic 3 weeks ago by myself. She is status post her first cycle of treatment and has tolerated it ***.   She saw Dr. Shannon in the interval and they recommended holding off on radiation to the parotid lesion at this time.   In the interval since being seen she established care with Dr. Buckley due to the abnormalities on her stagin brain MRI. He recommended follow up brain MRI in 4-6 weeks. This is scheduled for ***.   She also had a Port-A-Cath placed in the interval which she tolerated well.    She experiences shortness of breath nearly every morning, which improves with nebulizer use. She denies cough with hemoptysis, chest pain, fevers, chills, night sweats, or unintentional weight loss. She has a cheek lesion, previously biopsied and confirmed as metastatic.  She had baseline  anemia even prior to starting chemotherapy. She was started back on ***iron and she is ***. She denies gastrointestinal symptoms or headaches. She has intermittent blurry vision, especially with prolonged staring, and has deferred recommended cataract surgery due to her cancer diagnosis. She is here for evaluation and repeat blood work before undergoing cycle #2.     MEDICAL HISTORY: Past Medical History:  Diagnosis Date   Allergy  09/30/1996   Arthritis    Asthma    Complication of anesthesia    COPD (chronic obstructive pulmonary disease) (HCC)    Diabetes (HCC)    High cholesterol    History of radiation therapy    Right Lung- 12/04/21-12/11/21- Dr. Lynwood Shannon   Hypertension    Lung cancer [facility])    Neuromuscular disorder (HCC)    Pneumonia 09/10/2017   PONV (postoperative nausea and vomiting)    Stroke (HCC) 2007   mini strokes    ALLERGIES:  is allergic to other.  MEDICATIONS:  Current Outpatient Medications  Medication Sig Dispense Refill   albuterol  (VENTOLIN  HFA) 108 (90 Base) MCG/ACT inhaler Inhale 2 puffs into the lungs every 4 (four) hours as needed for wheezing or shortness of breath. 18 g 12   arformoterol  (BROVANA ) 15 MCG/2ML NEBU Take 2 mLs (15 mcg total) by nebulization 2 (two) times daily. 120 mL 12   aspirin  81 MG tablet Take 81 mg by mouth daily.     atorvastatin  (LIPITOR) 80 MG tablet TAKE 1 TABLET BY MOUTH EVERY DAY MONDAY THROUGH FRIDAY 90 tablet 1   benzonatate  (TESSALON ) 200 MG  capsule Take 1 capsule (200 mg total) by mouth 3 (three) times daily. (Patient not taking: Reported on 12/09/2024) 20 capsule 0   Biotin 89999 MCG TBDP Take 10,000 mcg by mouth daily.     bisoprolol  (ZEBETA ) 5 MG tablet TAKE 1/2 TABLET BY MOUTH EVERY DAY 45 tablet 3   budesonide  (PULMICORT ) 0.5 MG/2ML nebulizer solution Take 2 mLs (0.5 mg total) by nebulization 2 (two) times daily. 120 mL 12   buPROPion (WELLBUTRIN XL) 150 MG 24 hr tablet Take 150 mg by mouth daily.      CALCIUM -MAGNESIUM -ZINC PO Take 1 capsule by mouth at bedtime.     CELEBREX 200 MG capsule Take 200 mg by mouth daily.     Cholecalciferol (VITAMIN D3) 2000 UNITS TABS Take 2,000 Units by mouth at bedtime.     DULoxetine  (CYMBALTA ) 60 MG capsule TAKE 1 CAPSULE(60 MG) BY MOUTH DAILY 90 capsule 1   famotidine  (PEPCID ) 20 MG tablet Take 1 tablet (20 mg total) by mouth daily after supper. 90 tablet 3   folic acid  (FOLVITE ) 1 MG tablet Take 1 tablet (1 mg total) by mouth daily. Start 7 days before pemetrexed  chemotherapy. Continue until 21 days after pemetrexed  completed. 100 tablet 3   ipratropium-albuterol  (DUONEB) 0.5-2.5 (3) MG/3ML SOLN Take 3 mLs by nebulization every 4 (four) hours as needed. 360 mL 0   lidocaine -prilocaine  (EMLA ) cream Apply to affected area once 30 g 3   Magnesium  250 MG TABS Take 1 tablet by mouth daily.     methocarbamol  (ROBAXIN ) 500 MG tablet Take 1 tablet (500 mg total) by mouth every 8 (eight) hours as needed for muscle spasms. 30 tablet 0   montelukast  (SINGULAIR ) 10 MG tablet TAKE 1 TABLET(10 MG) BY MOUTH AT BEDTIME 90 tablet 3   ondansetron  (ZOFRAN ) 8 MG tablet Take 1 tablet (8 mg total) by mouth every 8 (eight) hours as needed for nausea or vomiting. Start on the third day after carboplatin . 30 tablet 1   oxyCODONE -acetaminophen  (PERCOCET) 10-325 MG tablet Take 1 tablet by mouth every 6 (six) hours as needed for pain. 30 tablet 0   pantoprazole  (PROTONIX ) 40 MG tablet TAKE 1 TABLET BY MOUTH 30 MINUTES BEFORE FIRST MEAL OF THE DAY 90 tablet 2   predniSONE  (DELTASONE ) 10 MG tablet 2 each am until better then 1 each am x 5 days and stop (Patient not taking: Reported on 12/09/2024) 100 tablet 2   pregabalin  (LYRICA ) 100 MG capsule Take 1 capsule (100 mg total) by mouth 3 (three) times daily. TAKE 1 CAPSULE(100 MG) BY MOUTH THREE TIMES DAILY 90 capsule 2   prochlorperazine  (COMPAZINE ) 10 MG tablet Take 1 tablet (10 mg total) by mouth every 6 (six) hours as needed for nausea or  vomiting. 30 tablet 1   promethazine  (PHENERGAN ) 25 MG tablet Take 25 mg by mouth every 6 (six) hours as needed for nausea or vomiting.     REPATHA  SURECLICK 140 MG/ML SOAJ ADMINISTER 1 ML UNDER THE SKIN EVERY 14 DAYS 6 mL 3   Semaglutide , 1 MG/DOSE, (OZEMPIC , 1 MG/DOSE,) 4 MG/3ML SOPN Inject 1 mg into the skin once a week. Friday 3 mL 3   No current facility-administered medications for this visit.    SURGICAL HISTORY:  Past Surgical History:  Procedure Laterality Date   ABDOMINAL HYSTERECTOMY  1995   BRONCHIAL BIOPSY  10/23/2021   Procedure: BRONCHIAL BIOPSIES;  Surgeon: Brenna Adine CROME, DO;  Location: [facility];  Service: Pulmonary;;   BRONCHIAL BRUSHINGS  10/23/2021  Procedure: BRONCHIAL BRUSHINGS;  Surgeon: Brenna Adine CROME, DO;  Location: [facility];  Service: Pulmonary;;   BRONCHIAL NEEDLE ASPIRATION BIOPSY  10/23/2021   Procedure: BRONCHIAL NEEDLE ASPIRATION BIOPSIES;  Surgeon: Brenna Adine CROME, DO;  Location: [facility];  Service: Pulmonary;;   carpel tunnel surgery Right 2006   COLONOSCOPY  2014   COLOSTOMY  2007   colostomy let down  2008   endoscopy  2014   FIDUCIAL MARKER PLACEMENT  10/23/2021   Procedure: FIDUCIAL MARKER PLACEMENT;  Surgeon: Brenna Adine CROME, DO;  Location: [facility];  Service: Pulmonary;;   IR IMAGING GUIDED PORT INSERTION  12/03/2024   rotator cuff tear Left 04/14/2020   TUBAL LIGATION     VIDEO BRONCHOSCOPY WITH ENDOBRONCHIAL NAVIGATION Right 10/23/2021   Procedure: VIDEO BRONCHOSCOPY WITH ENDOBRONCHIAL NAVIGATION;  Surgeon: Brenna Adine CROME, DO;  Location: [facility];  Service: Pulmonary;  Laterality: Right;  ION, w/ fiducial placement   VIDEO BRONCHOSCOPY WITH RADIAL ENDOBRONCHIAL ULTRASOUND  10/23/2021   Procedure: VIDEO BRONCHOSCOPY WITH RADIAL ENDOBRONCHIAL ULTRASOUND;  Surgeon: Brenna Adine CROME, DO;  Location: [facility];  Service: Pulmonary;;    REVIEW OF SYSTEMS:   Review of Systems  Constitutional: Negative for appetite  change, chills, fatigue, fever and unexpected weight change.  HENT:   Negative for mouth sores, nosebleeds, sore throat and trouble swallowing.   Eyes: Negative for eye problems and icterus.  Respiratory: Negative for cough, hemoptysis, shortness of breath and wheezing.   Cardiovascular: Negative for chest pain and leg swelling.  Gastrointestinal: Negative for abdominal pain, constipation, diarrhea, nausea and vomiting.  Genitourinary: Negative for bladder incontinence, difficulty urinating, dysuria, frequency and hematuria.   Musculoskeletal: Negative for back pain, gait problem, neck pain and neck stiffness.  Skin: Negative for itching and rash.  Neurological: Negative for dizziness, extremity weakness, gait problem, headaches, light-headedness and seizures.  Hematological: Negative for adenopathy. Does not bruise/bleed easily.  Psychiatric/Behavioral: Negative for confusion, depression and sleep disturbance. The patient is not nervous/anxious.     PHYSICAL EXAMINATION:  There were no vitals taken for this visit.  ECOG PERFORMANCE STATUS: {CHL ONC ECOG [id number]  Physical Exam  Constitutional: Oriented to person, place, and time and well-developed, well-nourished, and in no distress. No distress.  HENT:  Head: Normocephalic and atraumatic.  Mouth/Throat: Oropharynx is clear and moist. No oropharyngeal exudate.  Eyes: Conjunctivae are normal. Right eye exhibits no discharge. Left eye exhibits no discharge. No scleral icterus.  Neck: Normal range of motion. Neck supple.  Cardiovascular: Normal rate, regular rhythm, normal heart sounds and intact distal pulses.   Pulmonary/Chest: Effort normal and breath sounds normal. No respiratory distress. No wheezes. No rales.  Abdominal: Soft. Bowel sounds are normal. Exhibits no distension and no mass. There is no tenderness.  Musculoskeletal: Normal range of motion. Exhibits no edema.  Lymphadenopathy:    No cervical adenopathy.   Neurological: Alert and oriented to person, place, and time. Exhibits normal muscle tone. Gait normal. Coordination normal.  Skin: Skin is warm and dry. No rash noted. Not diaphoretic. No erythema. No pallor.  Psychiatric: Mood, memory and judgment normal.  Vitals reviewed.  LABORATORY DATA: Lab Results  Component Value Date   WBC 4.9 12/09/2024   HGB 9.9 (L) 12/09/2024   HCT 30.5 (L) 12/09/2024   MCV 78.6 (L) 12/09/2024   PLT 413 (H) 12/09/2024      Chemistry      Component Value Date/Time   NA 142 12/09/2024 0833   NA 139 05/31/2024 1240  K 4.0 12/09/2024 0833   CL 104 12/09/2024 0833   CO2 29 12/09/2024 0833   BUN 15 12/09/2024 0833   BUN 11 05/31/2024 1240   CREATININE 0.84 12/09/2024 0833   GLU 90 03/18/2022 0000      Component Value Date/Time   CALCIUM  9.5 12/09/2024 0833   ALKPHOS 73 12/09/2024 0833   AST 28 12/09/2024 0833   ALT 13 12/09/2024 0833   BILITOT 0.3 12/09/2024 0833       RADIOGRAPHIC STUDIES:  IR IMAGING GUIDED PORT INSERTION Result Date: 12/07/2024 INDICATION: 78 year old female with history of advanced stage lung cancer requiring central venous access for chemotherapy administration. EXAM: IMPLANTED PORT A CATH PLACEMENT WITH ULTRASOUND AND FLUOROSCOPIC GUIDANCE COMPARISON:  None Available. MEDICATIONS: None ANESTHESIA/SEDATION: None CONTRAST:  None FLUOROSCOPY TIME:  Fifteen mGy reference air kerma COMPLICATIONS: None immediate. PROCEDURE: The procedure, risks, benefits, and alternatives were explained to the patient. Questions regarding the procedure were encouraged and answered. The patient understands and consents to the procedure. The right neck and chest were prepped with chlorhexidine  in a sterile fashion, and a sterile drape was applied covering the operative field. Maximum barrier sterile technique with sterile gowns and gloves were used for the procedure. A timeout was performed prior to the initiation of the procedure. Ultrasound was used  to examine the jugular vein which was compressible and free of internal echoes. A skin marker was used to demarcate the planned venotomy and port pocket incision sites. Local anesthesia was provided to these sites and the subcutaneous tunnel track with 1% lidocaine  with 1:100,000 epinephrine . A small incision was created at the jugular access site and blunt dissection was performed of the subcutaneous tissues. Under ultrasound guidance, the jugular vein was accessed with a 21 ga micropuncture needle and an 0.018 wire was inserted to the superior vena cava. Real-time ultrasound guidance was utilized for vascular access including the acquisition of a permanent ultrasound image documenting patency of the accessed vessel. A 5 Fr micopuncture set was then used, through which a 0.035 Rosen wire was passed under fluoroscopic guidance into the inferior vena cava. An 8 Fr dilator was then placed over the wire. A subcutaneous port pocket was then created along the upper chest wall utilizing a combination of sharp and blunt dissection. The pocket was irrigated with sterile saline, packed with gauze, and observed for hemorrhage. A single lumen plastic power injectable port was chosen for placement. The 8 Fr catheter was tunneled from the port pocket site to the venotomy incision. The port was placed in the pocket. The external catheter was trimmed to appropriate length. The dilator was exchanged for an 8 Fr peel-away sheath under fluoroscopic guidance. The catheter was then placed through the sheath and the sheath was removed. Final catheter positioning was confirmed and documented with a fluoroscopic spot radiograph. The port was accessed with a Huber needle, aspirated, and flushed with heparinized saline. The deep dermal layer of the port pocket incision was closed with interrupted 3-0 Vicryl suture. The skin was opposed with a running subcuticular 4-0 Monocryl suture. Dermabond was then placed over the port pocket and neck  incisions. The patient tolerated the procedure well without immediate post procedural complication. FINDINGS: After catheter placement, the tip lies within the superior cavoatrial junction. The catheter aspirates and flushes normally and is ready for immediate use. IMPRESSION: Successful placement of a power injectable Port-A-Cath via the right internal jugular vein. The catheter is ready for immediate use. Ester Sides, MD Vascular and Interventional Radiology  [facility] Electronically Signed   By: Ester Sides M.D.   On: 12/07/2024 09:07     ASSESSMENT/PLAN:  This is a very pleasant 78 year old African-American female with stage IV (TX, N2, M1 C) non-small cell lung cancer, adenocarcinoma.  The patient presented with right hilar lymphadenopathy as well as a left parotid gland metastasis in addition to liver, pancreatic tail, and muscular metastasis.  She was diagnosed in October 2025.   Her molecular studies show she is positive for K-ras G 12C mutation and negative PD-L1 expression.  She also has negative c-Met overexpression.   She is currently undergoing palliative systemic chemotherapy and immunotherapy today with carboplatin  for an AUC of 5, Alimta  500 mg/m, Keytruda 200 mg IV every 3 weeks. She is status post 1 cycle.    Labs were reviewed.  ***Check Hbg. Recommend that she *** with cycle #2 today as scheduled.  She will continue to take iron supplements for her anemia.   We will she her in 3 weeks for evaluation and repeat blood work before undergoing cycle #3.     She scheduled for repeat brain MRI in January to follow-up.   The patient was advised to call immediately if she has any concerning symptoms in the interval. The patient voices understanding of current disease status and treatment options and is in agreement with the current care plan. All questions were answered. The patient knows to call the clinic with any problems, questions or concerns. We can  certainly see the patient much sooner if necessary    No orders of the defined types were placed in this encounter.    I spent {CHL ONC TIME VISIT - DTPQU:[id number]} counseling the patient face to face. The total time spent in the appointment was {CHL ONC TIME VISIT - DTPQU:[id number]}.  Vernita Tague L Ridhi Hoffert, PA-C 12/23/2024

## 2024-12-27 ENCOUNTER — Encounter (HOSPITAL_BASED_OUTPATIENT_CLINIC_OR_DEPARTMENT_OTHER): Payer: Self-pay

## 2024-12-27 ENCOUNTER — Inpatient Hospital Stay (HOSPITAL_BASED_OUTPATIENT_CLINIC_OR_DEPARTMENT_OTHER)
Admission: EM | Admit: 2024-12-27 | Discharge: 2025-01-03 | DRG: 947 | Disposition: A | Discharge status: Home / Self Care | Attending: Internal Medicine | Admitting: Internal Medicine

## 2024-12-27 ENCOUNTER — Emergency Department (HOSPITAL_COMMUNITY)
Admission: EM | Admit: 2024-12-27 | Discharge: 2024-12-27 | Discharge status: Against Medical Advice | Source: Home / Self Care

## 2024-12-27 ENCOUNTER — Emergency Department (HOSPITAL_BASED_OUTPATIENT_CLINIC_OR_DEPARTMENT_OTHER)

## 2024-12-27 ENCOUNTER — Telehealth: Payer: Self-pay

## 2024-12-27 ENCOUNTER — Other Ambulatory Visit: Payer: Self-pay

## 2024-12-27 DIAGNOSIS — Z1152 Encounter for screening for COVID-19: Secondary | ICD-10-CM

## 2024-12-27 DIAGNOSIS — Z79899 Other long term (current) drug therapy: Secondary | ICD-10-CM

## 2024-12-27 DIAGNOSIS — Z7951 Long term (current) use of inhaled steroids: Secondary | ICD-10-CM

## 2024-12-27 DIAGNOSIS — F05 Delirium due to known physiological condition: Principal | ICD-10-CM | POA: Diagnosis present

## 2024-12-27 DIAGNOSIS — Z91048 Other nonmedicinal substance allergy status: Secondary | ICD-10-CM

## 2024-12-27 DIAGNOSIS — Z90710 Acquired absence of both cervix and uterus: Secondary | ICD-10-CM

## 2024-12-27 DIAGNOSIS — E7800 Pure hypercholesterolemia, unspecified: Secondary | ICD-10-CM | POA: Diagnosis present

## 2024-12-27 DIAGNOSIS — Z823 Family history of stroke: Secondary | ICD-10-CM

## 2024-12-27 DIAGNOSIS — E119 Type 2 diabetes mellitus without complications: Secondary | ICD-10-CM | POA: Diagnosis present

## 2024-12-27 DIAGNOSIS — I2699 Other pulmonary embolism without acute cor pulmonale: Secondary | ICD-10-CM | POA: Diagnosis present

## 2024-12-27 DIAGNOSIS — Z85118 Personal history of other malignant neoplasm of bronchus and lung: Secondary | ICD-10-CM

## 2024-12-27 DIAGNOSIS — Z833 Family history of diabetes mellitus: Secondary | ICD-10-CM

## 2024-12-27 DIAGNOSIS — Z8673 Personal history of transient ischemic attack (TIA), and cerebral infarction without residual deficits: Secondary | ICD-10-CM

## 2024-12-27 DIAGNOSIS — Z87891 Personal history of nicotine dependence: Secondary | ICD-10-CM

## 2024-12-27 DIAGNOSIS — C787 Secondary malignant neoplasm of liver and intrahepatic bile duct: Secondary | ICD-10-CM | POA: Diagnosis present

## 2024-12-27 DIAGNOSIS — C7889 Secondary malignant neoplasm of other digestive organs: Secondary | ICD-10-CM | POA: Diagnosis present

## 2024-12-27 DIAGNOSIS — Z7985 Long-term (current) use of injectable non-insulin antidiabetic drugs: Secondary | ICD-10-CM

## 2024-12-27 DIAGNOSIS — C7989 Secondary malignant neoplasm of other specified sites: Secondary | ICD-10-CM | POA: Diagnosis present

## 2024-12-27 DIAGNOSIS — Z8249 Family history of ischemic heart disease and other diseases of the circulatory system: Secondary | ICD-10-CM

## 2024-12-27 DIAGNOSIS — I10 Essential (primary) hypertension: Secondary | ICD-10-CM | POA: Diagnosis present

## 2024-12-27 DIAGNOSIS — Z9221 Personal history of antineoplastic chemotherapy: Secondary | ICD-10-CM

## 2024-12-27 DIAGNOSIS — Z7982 Long term (current) use of aspirin: Secondary | ICD-10-CM

## 2024-12-27 DIAGNOSIS — Z9109 Other allergy status, other than to drugs and biological substances: Secondary | ICD-10-CM

## 2024-12-27 DIAGNOSIS — R410 Disorientation, unspecified: Principal | ICD-10-CM | POA: Diagnosis present

## 2024-12-27 DIAGNOSIS — R2990 Unspecified symptoms and signs involving the nervous system: Secondary | ICD-10-CM | POA: Diagnosis present

## 2024-12-27 DIAGNOSIS — G8929 Other chronic pain: Secondary | ICD-10-CM | POA: Diagnosis present

## 2024-12-27 DIAGNOSIS — R4701 Aphasia: Principal | ICD-10-CM | POA: Diagnosis present

## 2024-12-27 DIAGNOSIS — J441 Chronic obstructive pulmonary disease with (acute) exacerbation: Secondary | ICD-10-CM | POA: Diagnosis present

## 2024-12-27 DIAGNOSIS — Z923 Personal history of irradiation: Secondary | ICD-10-CM

## 2024-12-27 DIAGNOSIS — R29810 Facial weakness: Secondary | ICD-10-CM | POA: Diagnosis present

## 2024-12-27 LAB — COMPREHENSIVE METABOLIC PANEL WITH GFR
ALT: 16 U/L (ref 0–44)
AST: 30 U/L (ref 15–41)
Albumin: 4.5 g/dL (ref 3.5–5.0)
Alkaline Phosphatase: 87 U/L (ref 38–126)
Anion gap: 14 (ref 5–15)
BUN: 15 mg/dL (ref 8–23)
CO2: 27 mmol/L (ref 22–32)
Calcium: 9.6 mg/dL (ref 8.9–10.3)
Chloride: 96 mmol/L — ABNORMAL LOW (ref 98–111)
Creatinine, Ser: 0.9 mg/dL (ref 0.44–1.00)
GFR, Estimated: >60 mL/min
Glucose, Bld: 107 mg/dL — ABNORMAL HIGH (ref 70–99)
Potassium: 4.6 mmol/L (ref 3.5–5.1)
Sodium: 137 mmol/L (ref 135–145)
Total Bilirubin: 0.5 mg/dL (ref 0.0–1.2)
Total Protein: 8.1 g/dL (ref 6.5–8.1)

## 2024-12-27 LAB — RESP PANEL BY RT-PCR (RSV, FLU A&B, COVID)  RVPGX2
Influenza A by PCR: NEGATIVE
Influenza B by PCR: NEGATIVE
Resp Syncytial Virus by PCR: NEGATIVE
SARS Coronavirus 2 by RT PCR: NEGATIVE

## 2024-12-27 LAB — CBC WITH DIFFERENTIAL/PLATELET
Abs Immature Granulocytes: 0.01 K/uL (ref 0.00–0.07)
Basophils Absolute: 0 K/uL (ref 0.0–0.1)
Basophils Relative: 0 %
Eosinophils Absolute: 0 K/uL (ref 0.0–0.5)
Eosinophils Relative: 0 %
HCT: 33.9 % — ABNORMAL LOW (ref 36.0–46.0)
Hemoglobin: 11.2 g/dL — ABNORMAL LOW (ref 12.0–15.0)
Immature Granulocytes: 0 %
Lymphocytes Relative: 27 %
Lymphs Abs: 1 K/uL (ref 0.7–4.0)
MCH: 25.8 pg — ABNORMAL LOW (ref 26.0–34.0)
MCHC: 33 g/dL (ref 30.0–36.0)
MCV: 78.1 fL — ABNORMAL LOW (ref 80.0–100.0)
Monocytes Absolute: 0.5 K/uL (ref 0.1–1.0)
Monocytes Relative: 14 %
Neutro Abs: 2.1 K/uL (ref 1.7–7.7)
Neutrophils Relative %: 59 %
Platelets: 417 K/uL — ABNORMAL HIGH (ref 150–400)
RBC: 4.34 MIL/uL (ref 3.87–5.11)
RDW: 18.8 % — ABNORMAL HIGH (ref 11.5–15.5)
WBC: 3.6 K/uL — ABNORMAL LOW (ref 4.0–10.5)
nRBC: 0 % (ref 0.0–0.2)

## 2024-12-27 LAB — AMMONIA: Ammonia: <13 umol/L (ref 9–35)

## 2024-12-27 LAB — PROTIME-INR
INR: 1 (ref 0.8–1.2)
Prothrombin Time: 13.8 s (ref 11.4–15.2)

## 2024-12-27 LAB — D-DIMER, QUANTITATIVE: D-Dimer, Quant: 3.05 ug{FEU}/mL — ABNORMAL HIGH (ref 0.00–0.50)

## 2024-12-27 LAB — LACTIC ACID, PLASMA: Lactic Acid, Venous: 1.5 mmol/L (ref 0.5–1.9)

## 2024-12-27 LAB — CBG MONITORING, ED: Glucose-Capillary: 138 mg/dL — ABNORMAL HIGH (ref 70–99)

## 2024-12-27 MED ORDER — IOHEXOL 350 MG/ML SOLN
60.0000 mL | Freq: Once | INTRAVENOUS | Status: AC | PRN
  Administered 2024-12-27: 60 mL via INTRAVENOUS

## 2024-12-27 MED ORDER — LACTATED RINGERS IV BOLUS
1000.0000 mL | Freq: Once | INTRAVENOUS | Status: AC
  Administered 2024-12-27: 1000 mL via INTRAVENOUS

## 2024-12-27 MED ORDER — ONDANSETRON HCL 4 MG/2ML IJ SOLN
4.0000 mg | Freq: Once | INTRAMUSCULAR | Status: AC
  Administered 2024-12-27: 4 mg via INTRAVENOUS
  Filled 2024-12-27: qty 2

## 2024-12-27 MED ORDER — IOHEXOL 350 MG/ML SOLN
100.0000 mL | Freq: Once | INTRAVENOUS | Status: AC | PRN
  Administered 2024-12-27: 100 mL via INTRAVENOUS

## 2024-12-27 NOTE — ED Triage Notes (Signed)
 Daughter reports increased confusion, dizziness since 1200 today. A&Ox0. Daughter states she is weaker than usual. Unable to form sentences, slurred speech.   Also reports dizziness episodes spanning over the last few weeks.   Hx of lung cancer and COPD  MD in room

## 2024-12-27 NOTE — ED Notes (Signed)
 Left for CT, provider attempted USIV multiple times.

## 2024-12-27 NOTE — ED Provider Notes (Signed)
 " [place] [facility] Provider Note   CSN: [id number] Arrival date & time: 12/27/24  1604  An emergency department physician performed an initial assessment on this suspected stroke patient at 1636.  Patient presents with: Altered Mental Status   [name] is a 78 y.o. female.   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  Family reports that yesterday, she just laid in bed all day, did not walk at all, had severe fatigue and was reporting a headache.  She did not eat or drink anything.  She did have normal speech yesterday up until the time she went to bed at 9 PM per her husband and daughter.  This morning she did not wake up at the normal time and was sleeping and family at first that she was tired, however when they went to go wake her at 48 AM they found her to be unable to speak normally and unable to walk.  She seemed to have difficulty finding her words and her speech appeared to be slurred as well.  She had a fall onto her shoulder, and daughter is not sure if she has right shoulder weakness, or if she has pain from an injury to that shoulder.  They had not otherwise noticed focal weakness.  They deny any fevers, no cough or infectious symptoms.  Past Medical History:  Diagnosis Date   Allergy  09/30/1996   Arthritis    Asthma    Complication of anesthesia    COPD (chronic obstructive pulmonary disease) (HCC)    Diabetes (HCC)    High cholesterol    History of radiation therapy    Right Lung- 12/04/21-12/11/21- Dr. Lynwood Nasuti   Hypertension    Lung cancer [facility])    Neuromuscular disorder (HCC)    Pneumonia  09/10/2017   PONV (postoperative nausea and vomiting)    Stroke [facility]) 2007   mini strokes    Prior to Admission medications  Medication Sig Start Date End Date Taking? Authorizing Provider  albuterol  (VENTOLIN  HFA) 108 (90 Base) MCG/ACT inhaler Inhale 2 puffs into the lungs every 4 (four) hours as needed for wheezing or shortness of breath. 08/12/24   Darlean Ozell NOVAK, MD  arformoterol  (BROVANA ) 15 MCG/2ML NEBU Take 2 mLs (15 mcg total) by nebulization 2 (two) times daily. 08/12/24   Darlean Ozell NOVAK, MD  aspirin  81 MG tablet Take 81 mg by mouth daily.    [provider]  atorvastatin  (LIPITOR) 80 MG tablet TAKE 1 TABLET BY MOUTH EVERY DAY MONDAY THROUGH FRIDAY 09/27/24   Jarold Medici, MD  benzonatate  (TESSALON ) 200 MG capsule Take 1 capsule (200 mg total) by mouth 3 (three) times daily. Patient not taking: No sig reported 07/04/24   Barbarann Nest, MD  Biotin 89999 MCG TBDP Take 10,000 mcg by mouth daily.    [provider]  bisoprolol  (ZEBETA ) 5 MG tablet TAKE 1/2 TABLET BY MOUTH EVERY DAY 12/04/23   Jarold Medici, MD  budesonide  (PULMICORT ) 0.5 MG/2ML nebulizer solution Take 2 mLs (0.5 mg total) by nebulization 2 (two) times daily. 08/12/24   Darlean Ozell NOVAK, MD  buPROPion (WELLBUTRIN XL) 150 MG 24 hr tablet Take 150 mg by mouth daily. 11/10/24  [provider]  CALCIUM -MAGNESIUM -ZINC PO Take 1 capsule by mouth at bedtime.    [provider]  CELEBREX 200 MG capsule Take 200 mg by mouth daily. 12/07/24   [provider]  Cholecalciferol (VITAMIN D3) 2000 UNITS TABS Take 2,000 Units by mouth at bedtime.    [provider]  DULoxetine  (CYMBALTA ) 60 MG capsule TAKE 1 CAPSULE(60 MG) BY MOUTH DAILY 09/09/24   Jarold Medici, MD  famotidine  (PEPCID ) 20 MG tablet Take 1 tablet (20 mg total) by mouth daily after supper. 10/11/23   Olalere, Jennet LABOR, MD  folic acid  (FOLVITE ) 1 MG tablet Take 1 tablet (1 mg total) by mouth daily. Start 7 days before  pemetrexed  chemotherapy. Continue until 21 days after pemetrexed  completed. 11/23/24   Sherrod Sherrod, MD  ipratropium-albuterol  (DUONEB) 0.5-2.5 (3) MG/3ML SOLN Take 3 mLs by nebulization every 4 (four) hours as needed. 07/04/24   Barbarann Nest, MD  lidocaine -prilocaine  (EMLA ) cream Apply to affected area once 11/23/24   Sherrod Sherrod, MD  Magnesium  250 MG TABS Take 1 tablet by mouth daily.    [provider]  methocarbamol  (ROBAXIN ) 500 MG tablet Take 1 tablet (500 mg total) by mouth every 8 (eight) hours as needed for muscle spasms. 11/04/24   Georgina Speaks, FNP  montelukast  (SINGULAIR ) 10 MG tablet TAKE 1 TABLET(10 MG) BY MOUTH AT BEDTIME 10/31/23   Olalere, Adewale A, MD  ondansetron  (ZOFRAN ) 8 MG tablet Take 1 tablet (8 mg total) by mouth every 8 (eight) hours as needed for nausea or vomiting. Start on the third day after carboplatin . 11/23/24   Sherrod Sherrod, MD  oxyCODONE -acetaminophen  (PERCOCET) 10-325 MG tablet Take 1 tablet by mouth every 6 (six) hours as needed for pain. 11/04/24 11/04/25  Moore, Janece, FNP  pantoprazole  (PROTONIX ) 40 MG tablet TAKE 1 TABLET BY MOUTH 30 MINUTES BEFORE FIRST MEAL OF THE DAY 02/09/24   Jarold Medici, MD  predniSONE  (DELTASONE ) 10 MG tablet 2 each am until better then 1 each am x 5 days and stop Patient not taking: Reported on 12/09/2024 07/07/24   Darlean Ozell NOVAK, MD  pregabalin  (LYRICA ) 100 MG capsule Take 1 capsule (100 mg total) by mouth 3 (three) times daily. TAKE 1 CAPSULE(100 MG) BY MOUTH THREE TIMES DAILY 11/08/24   Jarold Medici, MD  prochlorperazine  (COMPAZINE ) 10 MG tablet Take 1 tablet (10 mg total) by mouth every 6 (six) hours as needed for nausea or vomiting. 11/23/24   Sherrod Sherrod, MD  promethazine  (PHENERGAN ) 25 MG tablet Take 25 mg by mouth every 6 (six) hours as needed for nausea or vomiting. 04/24/24   [provider]  REPATHA  SURECLICK 140 MG/ML SOAJ ADMINISTER 1 ML UNDER THE SKIN EVERY 14 DAYS 09/03/24   Hilty,  Vinie BROCKS, MD  Semaglutide , 1 MG/DOSE, (OZEMPIC , 1 MG/DOSE,) 4 MG/3ML SOPN Inject 1 mg into the skin once a week. Friday 04/23/22   Jarold Medici, MD    Allergies: Other    Review of Systems  Updated Vital Signs BP (!) 157/74   Pulse (!) 116   Temp 98.9 F (37.2 C) (Oral)   Resp (!) 26   SpO2 94%   Physical Exam Vitals and nursing note reviewed.  Constitutional:      General: She is not in acute distress.    Appearance: Normal appearance. She is well-developed. She is not ill-appearing or diaphoretic.  HENT:     Head: Normocephalic and atraumatic.  Eyes:     General: No visual field deficit.  Extraocular Movements: Extraocular movements intact.     Conjunctiva/sclera: Conjunctivae normal.     Pupils: Pupils are equal, round, and reactive to light.  Cardiovascular:     Rate and Rhythm: Normal rate and regular rhythm.     Pulses: Normal pulses.     Heart sounds: Normal heart sounds. No murmur heard.    No friction rub. No gallop.  Pulmonary:     Effort: Pulmonary effort is normal. No respiratory distress.     Breath sounds: Wheezing present. No rales.  Abdominal:     General: There is no distension.     Palpations: Abdomen is soft.     Tenderness: There is no abdominal tenderness. There is no guarding.  Musculoskeletal:        General: No swelling or tenderness.     Cervical back: Normal range of motion.  Skin:    General: Skin is warm and dry.     Findings: No erythema or rash.  Neurological:     Mental Status: She is alert.     GCS: GCS eye subscore is 4. GCS verbal subscore is 5. GCS motor subscore is 6.     Cranial Nerves: No cranial nerve deficit, dysarthria or facial asymmetry.     Sensory: No sensory deficit (unable to reliably assess in setting of aphasia).     Motor: Weakness (RUE, unclear if limited because of pain or weakness) present. No tremor.     Coordination: Coordination normal. Finger-Nose-Finger Test normal.     Gait: Gait normal.     Comments:  Answers yes, no. Has difficulty answering other questions, stumbles and pauses over words when asked location, date, details of being here.  Able to state name, states her husband.      (all labs ordered are listed, but only abnormal results are displayed) Labs Reviewed  CBC WITH DIFFERENTIAL/PLATELET - Abnormal; Notable for the following components:      Result Value   WBC 3.6 (*)    Hemoglobin 11.2 (*)    HCT 33.9 (*)    MCV 78.1 (*)    MCH 25.8 (*)    RDW 18.8 (*)    Platelets 417 (*)    All other components within normal limits  COMPREHENSIVE METABOLIC PANEL WITH GFR - Abnormal; Notable for the following components:   Chloride 96 (*)    Glucose, Bld 107 (*)    All other components within normal limits  D-DIMER, QUANTITATIVE - Abnormal; Notable for the following components:   D-Dimer, Quant 3.05 (*)    All other components within normal limits  CBG MONITORING, ED - Abnormal; Notable for the following components:   Glucose-Capillary 138 (*)    All other components within normal limits  RESP PANEL BY RT-PCR (RSV, FLU A&B, COVID)  RVPGX2  PROTIME-INR  AMMONIA  LACTIC ACID, PLASMA  URINALYSIS, W/ REFLEX TO CULTURE (INFECTION SUSPECTED)  LACTIC ACID, PLASMA    EKG: EKG Interpretation Date/Time:  Monday December 27 2024 18:22:08 EST Ventricular Rate:  112 PR Interval:  157 QRS Duration:  81 QT Interval:  346 QTC Calculation: 473 R Axis:   86  Text Interpretation: Sinus tachycardia Right atrial enlargement Borderline right axis deviation Since prior ECG, no significant changes Confirmed by Dreama Longs ([id number]) on 12/27/2024 7:16:48 PM  Radiology: [facility] Chest Portable 1 View Result Date: 12/27/2024 CLINICAL DATA:  Altered mental status. EXAM: PORTABLE CHEST 1 VIEW COMPARISON:  06/28/2024, CT 10/12/2024 FINDINGS: Right chest port with tip overlying the SVC. The  lungs are hyperinflated. Bronchial thickening. Periphery of the lung bases is excluded from the field of view.  The heart is normal in size. Bilateral hilar prominence likely due to enlarged pulmonary arteries on CT. Fiducial marker with the nodular density in the right upper lobe. No evidence of acute airspace disease. No pneumothorax or large pleural effusion. IMPRESSION: 1. Hyperinflation and bronchial thickening. No evidence of acute airspace disease. 2. Fiducial marker with nodular density in the right upper lobe. Electronically Signed   By: Andrea Gasman M.D.   On: 12/27/2024 18:41   CT ANGIO HEAD NECK W WO CM W PERF (CODE STROKE) Result Date: 12/27/2024 EXAM: CTA Head and Neck with Perfusion 12/27/2024 05:39:38 PM TECHNIQUE: CTA of the head and neck was performed without and with the administration of 100 mL of iohexol  (OMNIPAQUE ) 350 MG/ML injection. 3D postprocessing with multiplanar reconstructions and MIPs was performed to evaluate the vascular anatomy. Cerebral perfusion analysis using computed tomography with contrast administration, including post-processing of parametric maps with determination of cerebral blood flow, cerebral blood volume, mean transit time and time-to-maximum. Automated exposure control, iterative reconstruction, and/or weight based adjustment of the mA/kV was utilized to reduce the radiation dose to as low as reasonably achievable. COMPARISON: CT head dated 12/27/2024 CLINICAL HISTORY: Neuro deficit, acute, stroke suspected FINDINGS: CTA NECK: AORTIC ARCH AND ARCH VESSELS: Mild atherosclerosis of the partially visualized aortic arch. Atherosclerosis involves the aortic arch vessels without high grade stenosis. No dissection or arterial injury. No significant stenosis of the brachiocephalic or subclavian arteries. CERVICAL CAROTID ARTERIES: Calcified atherosclerosis at the right carotid bifurcation without hemodynamically significant stenosis. Calcification at the left carotid bifurcation without hemodynamically significant stenosis. No dissection or arterial injury. CERVICAL VERTEBRAL  ARTERIES: Atherosclerosis at the left vertebral artery origin resulting in moderate stenosis. Additional atherosclerosis at the right vertebral artery origin and along the right V1 segment resulting in mild stenosis. Atherosclerosis of the left V4 segment results in mild stenosis. No dissection or arterial injury. LUNGS AND MEDIASTINUM: There is abnormal soft tissue within the suprahilar aspect of the right upper lobe which is partially visualized and corresponds to findings from the CT chest on 10/12/2024. Scarring in the right upper lobe surrounds multiple upper lobe pulmonary artery branches. Treatment partially visualized right chest wall Port-A-Cath. SOFT TISSUES: There is a 1.9 x 1.8 cm mass involving the left masseter muscle adjacent to the anterior aspect of the left parotid gland corresponding to lesion seen on the MRI from 11/17/2024. Finding could reflect a metastatic lesion versus a primary parotid neoplasm. BONES: Degenerative changes in the visualized spine. Edentulous maxilla. CTA HEAD: ANTERIOR CIRCULATION: Atherosclerosis of the bilateral carotid siphons. There is mild stenosis of the bilateral cavernous and supraclinoid ICAs. No significant stenosis of the anterior cerebral arteries. No significant stenosis of the middle cerebral arteries. No aneurysm. POSTERIOR CIRCULATION: No significant stenosis of the posterior cerebral arteries. No significant stenosis of the basilar artery. No significant stenosis of the vertebral arteries. No aneurysm. OTHER: No dural venous sinus thrombosis on this non-dedicated study. CT PERFUSION: EXAM QUALITY: The CT perfusion images are mildly degraded by motion artifact. Exam quality is adequate with diagnostic perfusion maps. Appropriate arterial inflow and venous outflow curves. CORE INFARCT (CBF<30% volume): 0 mL TOTAL HYPOPERFUSION (Tmax>6s volume): 7 mL PENUMBRA: Mismatch volume: 7 mL Mismatch ratio: Not applicable Location: These regions of elevated Tmax are  primarily within the inferior aspects of the temporal lobes adjacent to the skull base and are favored to be artifactual. IMPRESSION: 1.  No acute large vessel occlusion. 2. No evidence of ischemia by CT brain perfusion. 3. Moderate stenosis at the left vertebral artery origin. Otherwise no high-grade stenosis or aneurysm in the head or neck vessels. 4. A 1.9 cm left masseter/parotid-adjacent mass, which could reflect metastatic disease versus a primary parotid neoplasm. Unchanged from recent MRI. 5. Impression #1-2 discussed with Dr. Matthews at 5:47 PM on 12/27/24. Electronically signed by: Donnice Mania MD 12/27/2024 06:04 PM EST RP Workstation: [id number]   CT HEAD CODE STROKE WO CONTRAST` Result Date: 12/27/2024 EXAM: CT HEAD WITHOUT 12/27/2024 05:15:52 PM TECHNIQUE: CT of the head was performed without the administration of intravenous contrast. Automated exposure control, iterative reconstruction, and/or weight based adjustment of the mA/kV was utilized to reduce the radiation dose to as low as reasonably achievable. COMPARISON: MRI head 11/17/2024. CLINICAL HISTORY: Neuro deficit, acute, stroke suspected. FINDINGS: BRAIN AND VENTRICLES: No acute intracranial hemorrhage. No mass effect or midline shift. No extra-axial fluid collection. No evidence of acute infarct. No hydrocephalus. Patchy white matter hypodensities, compatible with chronic microvascular ischemic disease. Basal ganglia calcifications. Calcifications of the dentate nuclei. Calcific atherosclerosis. Lesion in the anterior body of the right caudate nucleus seen on the prior MRI is not well evaluated on the current study. [place] Stroke Program Early CT (ASPECT) score: Ganglionic (caudate, internal capsule, lentiform nucleus, insula, M1-M3): 7 Supraganglionic (M4-M6): 3 Total: 10 ORBITS: No acute abnormality. SINUSES AND MASTOIDS: No acute abnormality. SOFT TISSUES AND SKULL: No acute skull fracture. No acute soft tissue abnormality. IMPRESSION:  1. No acute intracranial abnormality. 2. ASPECTS score is 10. 3. Lesion in the anterior body of the right caudate nucleus described on the prior MRI is not well evaluated on the current CT. 4. Findings messaged to Dr. Rosemarie via the [facility] messaging system at 5:27 PM on 12/27/24. Electronically signed by: Donnice Mania MD 12/27/2024 05:28 PM EST RP Workstation: [id number]     Procedures   Medications Ordered in the ED  iohexol  (OMNIPAQUE ) 350 MG/ML injection 100 mL (100 mLs Intravenous Contrast Given 12/27/24 1742)  ondansetron  (ZOFRAN ) injection 4 mg (4 mg Intravenous Given 12/27/24 2013)  lactated ringers  bolus 1,000 mL (1,000 mLs Intravenous New Bag/Given 12/27/24 2211)  iohexol  (OMNIPAQUE ) 350 MG/ML injection 60 mL (60 mLs Intravenous Contrast Given 12/27/24 2326)                                     78 year old female with a history of stage IV non-small cell lung cancer, adenocarcinoma, 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  Differential diagnosis includes CVA, metastases, encephalopathy secondary to metabolic abnormalities, anemia, electrolytes.  EKG completed and personally about interpreted by me shows sinus tachycardia. \  Labs obtained and personally about interpreted by me show mild leukopenia, mild anemia, no clinically significant electrolyte abnormalities.  Her lactic acidosis in the normal notes.  Her ammonia is normal.  Her COVID, flu and RSV testing were done given her lack of appetite and generalized weakness are negative.  Discussed with neurology on-call, and initially ordered a code stroke with concern for aphasia and possible right upper extremity weakness.  CTA and perfusion study was completed prior to neurology evaluation and given negative  CTA, patient out of TNKase window also with concern for possible  metastases, code stroke was canceled.  See PA was completed and evaluated by me and radiology showed no acute intracranial abnormalities, no signs of acute hemorrhage, no LVO or evidence of ischemia by CT brain perfusion.  On reevaluation, she continues to have difficulty word finding, and I have continued concern for CVA or metastases.  Although, note differential still continues to include encephalopathy.  Low suspicion for meningitis in the absence of fever or meningeal signs.  She does not have findings to suggest CO2 retention, and low suspicion for medication related altered mental status.    Plan for admission to the hospital for continued care, with plan for MRI with and without contrast.  While in the emergency department noted to have desaturation, and persistent tachycardia.  I D-dimer was positive and a CT PE study was completed, with the results of this pending at this time.  If she does have a PE, would do neurologic dosing of heparin  without a bolus.  Updated hospitalist regarding CT pending.  She continues to await admission at [facility] at time of transfer of care.       Final diagnoses:  Aphasia    ED Discharge Orders     None          Dreama Longs, MD 12/28/24 0000  "

## 2024-12-27 NOTE — ED Notes (Addendum)
 Triage RN spoke w PA Henderly about pt symptoms, PA stated to place pt in room. No orders at this time

## 2024-12-27 NOTE — ED Notes (Signed)
 Pt brought in by daughter, last known well was 2100 the night prior. No recent trauma or exposure to illness. Pt knows her name but having difficulty answering questions appropriately for her birthday, etc. Presenting with possible aphasia and AMS, per daughter.

## 2024-12-27 NOTE — Plan of Care (Signed)
 Canceled stroke code note  Telestroke was activated due to concern for confusion and R sided weakness. Patient was not a TNK candidate 2/2 no LKW. Head CT showed no acute abnormality. Patient was undergoing CTA when Dr. Rosemarie arrived on camera. Unfortunately her IV blew and he was unable to examine the patient over the next 30 minutes 2/2 multiple failed attempts to replace the IV including with ultrasound guidance. When IV access was finally achieved, CTA/CTP was immediately performed and personally reviewed which showed no LVO and no perfusion deficit. Stroke code canceled. D/w Dr. Dreama.  Elida Ross, MD [facility] [phone]  If 7pm- 7am, please page neurology on call as listed in AMION.

## 2024-12-27 NOTE — ED Notes (Signed)
 Patient transported to CT

## 2024-12-27 NOTE — ED Notes (Signed)
 Placed patient on 2L [place] do to patient de-saturation from sleeping.. Patient oxygen increased to 94%. Patient tolerating well.

## 2024-12-27 NOTE — ED Notes (Signed)
 Just returned from CT with the patient.

## 2024-12-27 NOTE — ED Notes (Signed)
Code Stroke activated by EDP.

## 2024-12-27 NOTE — ED Notes (Signed)
 Pt actively dry heaving when I went in for swallow screen. Will update EDP for meds, and holding off on diet. NPO for 4x hours until lifted.

## 2024-12-27 NOTE — Progress Notes (Signed)
 Plan of Care Note for accepted transfer   Patient: MICKIE BADDERS MRN: [id number]   DOA: 12/27/2024  Facility requesting transfer: [facility]   Requesting Provider: Dr. Dreama   Reason for transfer: Aphasia, weakness   Facility course: 78 yr old female with HTN, DM, COPD, and metastatic lung cancer who presents with weakness and aphasia that was noted this morning with LKW last night.   Code stroke was called but canceled by neurology after negative head CT and CTA head and neck.   Plan of care: The patient is accepted for admission to Telemetry unit, at [facility].   Author: Evalene GORMAN Sprinkles, MD 12/27/2024  Check [email or web address] for on-call coverage.  Nursing staff, Please call [facility] Admits & Consults System-Wide number on Amion as soon as patient's arrival, so appropriate admitting provider can evaluate the pt.

## 2024-12-27 NOTE — ED Notes (Addendum)
 EDP not activating a code stroke but wants to priortize the CT. Will obtain IV immediately and pull labs after.

## 2024-12-27 NOTE — Telephone Encounter (Signed)
 Daughter presented to lobby to let Dr Sherrod know that pt is in the ED.  Informed daughter we will inform Dr Sherrod.  Daughter voiced understanding.

## 2024-12-28 ENCOUNTER — Observation Stay (HOSPITAL_COMMUNITY)

## 2024-12-28 DIAGNOSIS — I63512 Cerebral infarction due to unspecified occlusion or stenosis of left middle cerebral artery: Secondary | ICD-10-CM

## 2024-12-28 DIAGNOSIS — R4701 Aphasia: Secondary | ICD-10-CM | POA: Diagnosis not present

## 2024-12-28 DIAGNOSIS — R29705 NIHSS score 5: Secondary | ICD-10-CM | POA: Diagnosis not present

## 2024-12-28 DIAGNOSIS — C7931 Secondary malignant neoplasm of brain: Secondary | ICD-10-CM | POA: Diagnosis not present

## 2024-12-28 DIAGNOSIS — R2990 Unspecified symptoms and signs involving the nervous system: Secondary | ICD-10-CM | POA: Diagnosis present

## 2024-12-28 LAB — URINALYSIS, W/ REFLEX TO CULTURE (INFECTION SUSPECTED)
Bilirubin Urine: NEGATIVE
Glucose, UA: NEGATIVE mg/dL
Ketones, ur: 40 mg/dL — AB
Leukocytes,Ua: NEGATIVE
Nitrite: NEGATIVE
Protein, ur: NEGATIVE mg/dL
Specific Gravity, Urine: 1.015 (ref 1.005–1.030)
pH: 6.5 (ref 5.0–8.0)

## 2024-12-28 LAB — GLUCOSE, CAPILLARY: Glucose-Capillary: 96 mg/dL (ref 70–99)

## 2024-12-28 LAB — HEMOGLOBIN A1C
Hgb A1c MFr Bld: 6.3 % — ABNORMAL HIGH (ref 4.8–5.6)
Mean Plasma Glucose: 134.11 mg/dL

## 2024-12-28 LAB — HEPARIN LEVEL (UNFRACTIONATED)
Heparin Unfractionated: 0.38 [IU]/mL (ref 0.30–0.70)
Heparin Unfractionated: <0.1 [IU]/mL — ABNORMAL LOW (ref 0.30–0.70)
Heparin Unfractionated: <0.1 [IU]/mL — ABNORMAL LOW (ref 0.30–0.70)

## 2024-12-28 MED ORDER — GADOBUTROL 1 MMOL/ML IV SOLN
7.0000 mL | Freq: Once | INTRAVENOUS | Status: AC | PRN
  Administered 2024-12-28: 7 mL via INTRAVENOUS

## 2024-12-28 MED ORDER — BUDESONIDE 0.25 MG/2ML IN SUSP
0.2500 mg | Freq: Two times a day (BID) | RESPIRATORY_TRACT | Status: DC
  Administered 2024-12-29 – 2025-01-03 (×10): 0.25 mg via RESPIRATORY_TRACT
  Filled 2024-12-28 (×11): qty 2

## 2024-12-28 MED ORDER — PREGABALIN 100 MG PO CAPS
100.0000 mg | ORAL_CAPSULE | Freq: Three times a day (TID) | ORAL | Status: DC
  Administered 2024-12-28 – 2025-01-03 (×18): 100 mg via ORAL
  Filled 2024-12-28 (×18): qty 1

## 2024-12-28 MED ORDER — BISOPROLOL FUMARATE 5 MG PO TABS
2.5000 mg | ORAL_TABLET | Freq: Every day | ORAL | Status: DC
  Administered 2024-12-28 – 2024-12-31 (×4): 2.5 mg via ORAL
  Filled 2024-12-28 (×4): qty 0.5

## 2024-12-28 MED ORDER — ATORVASTATIN CALCIUM 40 MG PO TABS
40.0000 mg | ORAL_TABLET | Freq: Every day | ORAL | Status: DC
  Administered 2024-12-28: 40 mg via ORAL
  Filled 2024-12-28: qty 1

## 2024-12-28 MED ORDER — SENNOSIDES-DOCUSATE SODIUM 8.6-50 MG PO TABS: 1.0000 | ORAL_TABLET | Freq: Every evening | ORAL | Status: DC | PRN

## 2024-12-28 MED ORDER — REVEFENACIN 175 MCG/3ML IN SOLN: 175.0000 ug | Freq: Every day | RESPIRATORY_TRACT | Status: DC

## 2024-12-28 MED ORDER — STROKE: EARLY STAGES OF RECOVERY BOOK
Freq: Once | Status: AC
  Filled 2024-12-28: qty 1

## 2024-12-28 MED ORDER — ARFORMOTEROL TARTRATE 15 MCG/2ML IN NEBU
15.0000 ug | INHALATION_SOLUTION | Freq: Two times a day (BID) | RESPIRATORY_TRACT | Status: DC
  Administered 2024-12-29 – 2025-01-03 (×10): 15 ug via RESPIRATORY_TRACT
  Filled 2024-12-28 (×11): qty 2

## 2024-12-28 MED ORDER — PANTOPRAZOLE SODIUM 40 MG PO TBEC
40.0000 mg | DELAYED_RELEASE_TABLET | Freq: Every day | ORAL | Status: DC
  Administered 2024-12-28 – 2025-01-03 (×7): 40 mg via ORAL
  Filled 2024-12-28 (×7): qty 1

## 2024-12-28 MED ORDER — REVEFENACIN 175 MCG/3ML IN SOLN
175.0000 ug | Freq: Every day | RESPIRATORY_TRACT | Status: DC
  Administered 2024-12-29 – 2025-01-03 (×6): 175 ug via RESPIRATORY_TRACT
  Filled 2024-12-28 (×6): qty 3

## 2024-12-28 MED ORDER — HEPARIN (PORCINE) 25000 UT/250ML-% IV SOLN
1100.0000 [IU]/h | INTRAVENOUS | Status: AC
  Administered 2024-12-28: 1000 [IU]/h via INTRAVENOUS
  Administered 2024-12-29: 1100 [IU]/h via INTRAVENOUS
  Filled 2024-12-28 (×2): qty 250

## 2024-12-28 MED ORDER — DULOXETINE HCL 60 MG PO CPEP
60.0000 mg | ORAL_CAPSULE | Freq: Every day | ORAL | Status: DC
  Administered 2024-12-28 – 2025-01-03 (×7): 60 mg via ORAL
  Filled 2024-12-28 (×7): qty 1

## 2024-12-28 MED ORDER — ACETAMINOPHEN 325 MG PO TABS: 650.0000 mg | ORAL_TABLET | ORAL | Status: DC | PRN

## 2024-12-28 MED ORDER — ACETAMINOPHEN 650 MG RE SUPP: 650.0000 mg | RECTAL | Status: DC | PRN

## 2024-12-28 MED ORDER — SODIUM CHLORIDE 0.9 % IV SOLN: INTRAVENOUS | Status: AC

## 2024-12-28 MED ORDER — ALBUTEROL SULFATE (2.5 MG/3ML) 0.083% IN NEBU: 2.5000 mg | INHALATION_SOLUTION | Freq: Four times a day (QID) | RESPIRATORY_TRACT | Status: DC

## 2024-12-28 MED ORDER — ALBUTEROL SULFATE (2.5 MG/3ML) 0.083% IN NEBU
2.5000 mg | INHALATION_SOLUTION | RESPIRATORY_TRACT | Status: DC | PRN
  Filled 2024-12-28: qty 3

## 2024-12-28 MED ORDER — ATORVASTATIN CALCIUM 80 MG PO TABS
80.0000 mg | ORAL_TABLET | Freq: Every day | ORAL | Status: DC
  Administered 2024-12-29 – 2025-01-03 (×6): 80 mg via ORAL
  Filled 2024-12-28 (×6): qty 1

## 2024-12-28 MED ORDER — ACETAMINOPHEN 160 MG/5ML PO SOLN: 650.0000 mg | ORAL | Status: DC | PRN

## 2024-12-28 MED ORDER — ALBUTEROL SULFATE (2.5 MG/3ML) 0.083% IN NEBU
2.5000 mg | INHALATION_SOLUTION | Freq: Four times a day (QID) | RESPIRATORY_TRACT | Status: DC
  Administered 2024-12-29 – 2025-01-01 (×10): 2.5 mg via RESPIRATORY_TRACT
  Filled 2024-12-28 (×15): qty 3

## 2024-12-28 NOTE — ED Provider Notes (Signed)
 Received phone call from radiology.  Patient with single segmental PE in the right lower lobe without right heart strain.  As per prior treatment plan developed by Dr. Dreama and Dr. Khaliqdina, will initiate heparin  without bolus.  Pharmacy to dose.   Haze Lonni PARAS, MD 12/28/24 [phone]

## 2024-12-28 NOTE — Progress Notes (Signed)
 PHARMACY - ANTICOAGULATION CONSULT NOTE  Pharmacy Consult for Heparin  Indication: pulmonary embolus  Vital Signs: Temp: 99 F (37.2 C) (12/30 1821) Temp Source: Oral (12/30 1821) BP: 152/71 (12/30 1821) Pulse Rate: 96 (12/30 1821)  Labs: Recent Labs    12/27/24 1639 12/28/24 1105 12/28/24 1724 12/28/24 1924  HGB 11.2*  --   --   --   HCT 33.9*  --   --   --   PLT 417*  --   --   --   LABPROT 13.8  --   --   --   INR 1.0  --   --   --   HEPARINUNFRC  --  0.38 <0.10* <0.10*  CREATININE 0.90  --   --   --     Estimated Creatinine Clearance: 53.1 mL/min (by C-G formula based on SCr of 0.9 mg/dL).  Assessment: 78 y.o. female admitted with possible CVA, found to have small RLL PE on chest CT. She was started on IV heparin . Initial heparin  level si therapeutic at 0.38. No bleeding or other issues noted.   PM update: heparin  level undetectable. Per RN, no issues with the heparin  infusion (no pauses, interruptions, no IV site infiltration). Verified level with recheck which is also undetectable. No bleeding issues per RN.  Goal of Therapy:  Heparin  level 0.3-0.5 units/mL Monitor platelets by anticoagulation protocol: Yes   Plan:  Increase heparin  gtt to 1150 units/hr Recheck heparin  level in 8hr and daily Daily heparin  level and CBC  Rocky Slade, PharmD, BCPS Clinical Pharmacist Please see AMION for all pharmacy numbers 12/28/2024 7:46 PM

## 2024-12-28 NOTE — ED Notes (Signed)
 Arrived to pt room during morning rounds, pt expressed that she needed to pee. Pt was placed on bed pan and encouraged to urinate.  Same was completed successfully.  Small spill took place and the pts entire bedding was replaced.  And pt repositioned.  Pt is resting comfortably and call bell in reach.

## 2024-12-28 NOTE — Progress Notes (Signed)
 PHARMACY - ANTICOAGULATION CONSULT NOTE  Pharmacy Consult for Heparin  Indication: pulmonary embolus  Allergies[1]  Patient Measurements:    Vital Signs: Temp: 98.9 F (37.2 C) (12/29 1854) Temp Source: Oral (12/29 1854) BP: 148/85 (12/29 2315) Pulse Rate: 114 (12/30 0016)  Labs: Recent Labs    12/27/24 1639  HGB 11.2*  HCT 33.9*  PLT 417*  LABPROT 13.8  INR 1.0  CREATININE 0.90    Estimated Creatinine Clearance: 53.1 mL/min (by C-G formula based on SCr of 0.9 mg/dL).   Medical History: Past Medical History:  Diagnosis Date   Allergy  09/30/1996   Arthritis    Asthma    Complication of anesthesia    COPD (chronic obstructive pulmonary disease) (HCC)    Diabetes (HCC)    High cholesterol    History of radiation therapy    Right Lung- 12/04/21-12/11/21- Dr. Lynwood Nasuti   Hypertension    Lung cancer [facility])    Neuromuscular disorder (HCC)    Pneumonia 09/10/2017   PONV (postoperative nausea and vomiting)    Stroke [facility]) 2007   mini strokes    Medications:  Medications Ordered Prior to Encounter[2]   Assessment: 78 y.o. female admitted with CVA, found to have small RLL PE on chest CT, for heparin   Goal of Therapy:  Heparin  level 0.3-0.5 units/mL Monitor platelets by anticoagulation protocol: Yes   Plan:  Start heparin  1000 units/hr Check heparin  level in 8 hours.   Kyzen Horn, Cordella Misty 12/28/2024,2:33 AM      [1]  Allergies Allergen Reactions   Other Swelling    HAIR DYE  [2]  No current facility-administered medications on file prior to encounter.   Current Outpatient Medications on File Prior to Encounter  Medication Sig Dispense Refill   albuterol  (VENTOLIN  HFA) 108 (90 Base) MCG/ACT inhaler Inhale 2 puffs into the lungs every 4 (four) hours as needed for wheezing or shortness of breath. 18 g 12   arformoterol  (BROVANA ) 15 MCG/2ML NEBU Take 2 mLs (15 mcg total) by nebulization 2 (two) times daily. 120 mL 12   aspirin  81 MG tablet Take  81 mg by mouth daily.     atorvastatin  (LIPITOR) 80 MG tablet TAKE 1 TABLET BY MOUTH EVERY DAY MONDAY THROUGH FRIDAY 90 tablet 1   benzonatate  (TESSALON ) 200 MG capsule Take 1 capsule (200 mg total) by mouth 3 (three) times daily. (Patient not taking: No sig reported) 20 capsule 0   Biotin 89999 MCG TBDP Take 10,000 mcg by mouth daily.     bisoprolol  (ZEBETA ) 5 MG tablet TAKE 1/2 TABLET BY MOUTH EVERY DAY 45 tablet 3   budesonide  (PULMICORT ) 0.5 MG/2ML nebulizer solution Take 2 mLs (0.5 mg total) by nebulization 2 (two) times daily. 120 mL 12   buPROPion (WELLBUTRIN XL) 150 MG 24 hr tablet Take 150 mg by mouth daily.     CALCIUM -MAGNESIUM -ZINC PO Take 1 capsule by mouth at bedtime.     CELEBREX 200 MG capsule Take 200 mg by mouth daily.     Cholecalciferol (VITAMIN D3) 2000 UNITS TABS Take 2,000 Units by mouth at bedtime.     DULoxetine  (CYMBALTA ) 60 MG capsule TAKE 1 CAPSULE(60 MG) BY MOUTH DAILY 90 capsule 1   famotidine  (PEPCID ) 20 MG tablet Take 1 tablet (20 mg total) by mouth daily after supper. 90 tablet 3   folic acid  (FOLVITE ) 1 MG tablet Take 1 tablet (1 mg total) by mouth daily. Start 7 days before pemetrexed  chemotherapy. Continue until 21 days after pemetrexed  completed. 100  tablet 3   ipratropium-albuterol  (DUONEB) 0.5-2.5 (3) MG/3ML SOLN Take 3 mLs by nebulization every 4 (four) hours as needed. 360 mL 0   lidocaine -prilocaine  (EMLA ) cream Apply to affected area once 30 g 3   Magnesium  250 MG TABS Take 1 tablet by mouth daily.     methocarbamol  (ROBAXIN ) 500 MG tablet Take 1 tablet (500 mg total) by mouth every 8 (eight) hours as needed for muscle spasms. 30 tablet 0   montelukast  (SINGULAIR ) 10 MG tablet TAKE 1 TABLET(10 MG) BY MOUTH AT BEDTIME 90 tablet 3   ondansetron  (ZOFRAN ) 8 MG tablet Take 1 tablet (8 mg total) by mouth every 8 (eight) hours as needed for nausea or vomiting. Start on the third day after carboplatin . 30 tablet 1   oxyCODONE -acetaminophen  (PERCOCET) 10-325 MG  tablet Take 1 tablet by mouth every 6 (six) hours as needed for pain. 30 tablet 0   pantoprazole  (PROTONIX ) 40 MG tablet TAKE 1 TABLET BY MOUTH 30 MINUTES BEFORE FIRST MEAL OF THE DAY 90 tablet 2   predniSONE  (DELTASONE ) 10 MG tablet 2 each am until better then 1 each am x 5 days and stop (Patient not taking: Reported on 12/09/2024) 100 tablet 2   pregabalin  (LYRICA ) 100 MG capsule Take 1 capsule (100 mg total) by mouth 3 (three) times daily. TAKE 1 CAPSULE(100 MG) BY MOUTH THREE TIMES DAILY 90 capsule 2   prochlorperazine  (COMPAZINE ) 10 MG tablet Take 1 tablet (10 mg total) by mouth every 6 (six) hours as needed for nausea or vomiting. 30 tablet 1   promethazine  (PHENERGAN ) 25 MG tablet Take 25 mg by mouth every 6 (six) hours as needed for nausea or vomiting.     REPATHA  SURECLICK 140 MG/ML SOAJ ADMINISTER 1 ML UNDER THE SKIN EVERY 14 DAYS 6 mL 3   Semaglutide , 1 MG/DOSE, (OZEMPIC , 1 MG/DOSE,) 4 MG/3ML SOPN Inject 1 mg into the skin once a week. Friday 3 mL 3

## 2024-12-28 NOTE — H&P (Addendum)
 " History and Physical    [name] FMW:[id number] DOB: [birth year] DOA: 12/27/2024  PCP: Jarold Medici, MD  Patient coming from: home  I have personally briefly reviewed patient's old medical records in [facility]  Chief Complaint: word finding issues, confusion, slurred speech  HPI: [name] is [name] 78 y.o. female with medical history significant of metastatic lung cancer, COPD, HTN and multiple other medical issues here with word finding issues, confusion, and slurred speech.   Her husband and daughter notes that she's been laying in bed all day and was complaining of [name] HA the day prior to presentation.  Poor appetite, wasn't eating or drinking.  She had normal speech up until the time she went to bed at 9 PM.  She woke up around noon and was described as beng glassy eyed.  She didn't know her name.  She had slurred speech and could barely standup.  [facility] went [facility], but left and went to [facility] due to the wait.  ED Course: Labs, imaging.  Code stroke canceled (see neurology plan of care note from 12/29).  Review of Systems: As per HPI otherwise all other systems reviewed and are negative.  Past Medical History:  Diagnosis Date   Allergy  09/30/1996   Arthritis    Asthma    Complication of anesthesia    COPD (chronic obstructive pulmonary disease) (HCC)    Diabetes (HCC)    High cholesterol    History of radiation therapy    Right Lung- 12/04/21-12/11/21- Dr. Lynwood Nasuti   Hypertension    Lung cancer [facility])    Neuromuscular disorder (HCC)    Pneumonia 09/10/2017   PONV (postoperative nausea and vomiting)    Stroke (HCC) 2007   mini strokes    Past Surgical History:  Procedure Laterality Date   ABDOMINAL HYSTERECTOMY  1995   BRONCHIAL BIOPSY  10/23/2021   Procedure: BRONCHIAL BIOPSIES;  Surgeon: Brenna Adine CROME, DO;  Location: [facility];  Service: Pulmonary;;   BRONCHIAL BRUSHINGS  10/23/2021   Procedure: BRONCHIAL BRUSHINGS;   Surgeon: Brenna Adine CROME, DO;  Location: [facility];  Service: Pulmonary;;   BRONCHIAL NEEDLE ASPIRATION BIOPSY  10/23/2021   Procedure: BRONCHIAL NEEDLE ASPIRATION BIOPSIES;  Surgeon: Brenna Adine CROME, DO;  Location: [facility] ENDOS[facility];  Service: Pulmonary;;   carpel tunnel surgery Right 2006   COLONOSCOPY  2014   COLOSTOMY  2007   colostomy let down  2008   endoscopy  2014   FIDUCIAL MARKER PLACEMENT  10/23/2021   Procedure: FIDUCIAL MARKER PLACEMENT;  Surgeon: Brenna Adine CROME, DO;  Location: [facility];  Service: Pulmonary;;   IR IMAGING GUIDED PORT INSERTION  12/03/2024   rotator cuff tear Left 04/14/2020   TUBAL LIGATION     VIDEO BRONCHOSCOPY WITH ENDOBRONCHIAL NAVIGATION Right 10/23/2021   Procedure: VIDEO BRONCHOSCOPY WITH ENDOBRONCHIAL NAVIGATION;  Surgeon: Brenna Adine CROME, DO;  Location: [facility];  Service: Pulmonary;  Laterality: Right;  ION, w/ fiducial placement   VIDEO BRONCHOSCOPY WITH RADIAL ENDOBRONCHIAL ULTRASOUND  10/23/2021   Procedure: VIDEO BRONCHOSCOPY WITH RADIAL ENDOBRONCHIAL ULTRASOUND;  Surgeon: Brenna Adine CROME, DO;  Location: [facility];  Service: Pulmonary;;    Social History  reports that she quit smoking about 18 years ago. Her smoking use included cigarettes. She started smoking about 57 years ago. She has [name] 21 pack-year smoking history. She has never used smokeless tobacco. She reports that she does not currently use alcohol. She reports that she does  not use drugs.  Allergies[1]  Family History  Problem Relation Age of Onset   Heart murmur Mother    Heart attack Father    Diabetes Father    Diabetes Brother    Stroke Brother    Vision loss Brother    Vision loss Brother    Prior to Admission medications  Medication Sig Start Date End Date Taking? Authorizing Provider  albuterol  (VENTOLIN  HFA) 108 (90 Base) MCG/ACT inhaler Inhale 2 puffs into the lungs every 4 (four) hours as needed for wheezing or shortness of breath. 08/12/24   Darlean Ozell NOVAK, MD  arformoterol  (BROVANA ) 15 MCG/2ML NEBU Take 2 mLs (15 mcg total) by nebulization 2 (two) times daily. 08/12/24   Darlean Ozell NOVAK, MD  aspirin  81 MG tablet Take 81 mg by mouth daily.    [provider]  atorvastatin  (LIPITOR) 80 MG tablet TAKE 1 TABLET BY MOUTH EVERY DAY MONDAY THROUGH FRIDAY 09/27/24   Jarold Medici, MD  benzonatate  (TESSALON ) 200 MG capsule Take 1 capsule (200 mg total) by mouth 3 (three) times daily. Patient not taking: No sig reported 07/04/24   Barbarann Nest, MD  Biotin 89999 MCG TBDP Take 10,000 mcg by mouth daily.    [provider]  bisoprolol  (ZEBETA ) 5 MG tablet TAKE 1/2 TABLET BY MOUTH EVERY DAY 12/04/23   Jarold Medici, MD  budesonide  (PULMICORT ) 0.5 MG/2ML nebulizer solution Take 2 mLs (0.5 mg total) by nebulization 2 (two) times daily. 08/12/24   Darlean Ozell NOVAK, MD  buPROPion (WELLBUTRIN XL) 150 MG 24 hr tablet Take 150 mg by mouth daily. 11/10/24   [provider]  CALCIUM -MAGNESIUM -ZINC PO Take 1 capsule by mouth at bedtime.    [provider]  CELEBREX 200 MG capsule Take 200 mg by mouth daily. 12/07/24   [provider]  Cholecalciferol (VITAMIN D3) 2000 UNITS TABS Take 2,000 Units by mouth at bedtime.    [provider]  DULoxetine  (CYMBALTA ) 60 MG capsule TAKE 1 CAPSULE(60 MG) BY MOUTH DAILY 09/09/24   Jarold Medici, MD  famotidine  (PEPCID ) 20 MG tablet Take 1 tablet (20 mg total) by mouth daily after supper. 10/11/23   Olalere, Jennet LABOR, MD  folic acid  (FOLVITE ) 1 MG tablet Take 1 tablet (1 mg total) by mouth daily. Start 7 days before pemetrexed  chemotherapy. Continue until 21 days after pemetrexed  completed. 11/23/24   Sherrod Sherrod, MD  ipratropium-albuterol  (DUONEB) 0.5-2.5 (3) MG/3ML SOLN Take 3 mLs by nebulization every 4 (four) hours as needed. 07/04/24   Barbarann Nest, MD  lidocaine -prilocaine  (EMLA ) cream Apply to affected area once 11/23/24   Sherrod Sherrod, MD  Magnesium  250 MG  TABS Take 1 tablet by mouth daily.    [provider]  methocarbamol  (ROBAXIN ) 500 MG tablet Take 1 tablet (500 mg total) by mouth every 8 (eight) hours as needed for muscle spasms. 11/04/24   Georgina Speaks, FNP  montelukast  (SINGULAIR ) 10 MG tablet TAKE 1 TABLET(10 MG) BY MOUTH AT BEDTIME 10/31/23   Olalere, Adewale Edrian Melucci, MD  ondansetron  (ZOFRAN ) 8 MG tablet Take 1 tablet (8 mg total) by mouth every 8 (eight) hours as needed for nausea or vomiting. Start on the third day after carboplatin . 11/23/24   Sherrod Sherrod, MD  oxyCODONE -acetaminophen  (PERCOCET) 10-325 MG tablet Take 1 tablet by mouth every 6 (six) hours as needed for pain. 11/04/24 11/04/25  Moore, Janece, FNP  pantoprazole  (PROTONIX ) 40 MG tablet TAKE 1 TABLET BY MOUTH 30 MINUTES BEFORE FIRST MEAL OF THE DAY  02/09/24   Jarold Medici, MD  predniSONE  (DELTASONE ) 10 MG tablet 2 each am until better then 1 each am x 5 days and stop Patient not taking: Reported on 12/09/2024 07/07/24   Darlean Ozell NOVAK, MD  pregabalin  (LYRICA ) 100 MG capsule Take 1 capsule (100 mg total) by mouth 3 (three) times daily. TAKE 1 CAPSULE(100 MG) BY MOUTH THREE TIMES DAILY 11/08/24   Jarold Medici, MD  prochlorperazine  (COMPAZINE ) 10 MG tablet Take 1 tablet (10 mg total) by mouth every 6 (six) hours as needed for nausea or vomiting. 11/23/24   Sherrod Sherrod, MD  promethazine  (PHENERGAN ) 25 MG tablet Take 25 mg by mouth every 6 (six) hours as needed for nausea or vomiting. 04/24/24   [provider]  REPATHA  SURECLICK 140 MG/ML SOAJ ADMINISTER 1 ML UNDER THE SKIN EVERY 14 DAYS 09/03/24   Hilty, Vinie BROCKS, MD  Semaglutide , 1 MG/DOSE, (OZEMPIC , 1 MG/DOSE,) 4 MG/3ML SOPN Inject 1 mg into the skin once Daryll Spisak week. Friday 04/23/22   Jarold Medici, MD    Physical Exam: Vitals:   12/28/24 1030 12/28/24 1039 12/28/24 1349 12/28/24 1428  BP: 137/79  (!) 149/70   Pulse: (!) 101  (!) 102   Resp: 17  18   Temp:  (!) 97.4 F (36.3 C) (!) 97.5 F (36.4 C)    TempSrc:  Oral    SpO2: 100%  97% 96%    Constitutional: NAD, calm, comfortable Vitals:   12/28/24 1030 12/28/24 1039 12/28/24 1349 12/28/24 1428  BP: 137/79  (!) 149/70   Pulse: (!) 101  (!) 102   Resp: 17  18   Temp:  (!) 97.4 F (36.3 C) (!) 97.5 F (36.4 C)   TempSrc:  Oral    SpO2: 100%  97% 96%   Eyes: PERRL ENMT: Mucous membranes are moist Neck: normal, supple Respiratory: bilateral wheezing, increased WOB Cardiovascular: Regular rate and rhythm, no murmurs / rubs / gallops. No extremity edema. Abdomen: no tenderness, no masses palpated.  Musculoskeletal: no clubbing / cyanosis. No joint deformity upper and lower extremities. Good ROM, no contractures. Normal muscle tone.  Skin: no rashes, lesions, ulcers. No induration Neurologic: expressive aphasia.  Right upper extremity weakness.  Issues with FNF on R.    Labs on Admission: I have personally reviewed following labs and imaging studies  CBC: Recent Labs  Lab 12/27/24 1639  WBC 3.6*  NEUTROABS 2.1  HGB 11.2*  HCT 33.9*  MCV 78.1*  PLT 417*    Basic Metabolic Panel: Recent Labs  Lab 12/27/24 1639  NA 137  K 4.6  CL 96*  CO2 27  GLUCOSE 107*  BUN 15  CREATININE 0.90  CALCIUM  9.6    GFR: Estimated Creatinine Clearance: 53.1 mL/min (by C-G formula based on SCr of 0.9 mg/dL).  Liver Function Tests: Recent Labs  Lab 12/27/24 1639  AST 30  ALT 16  ALKPHOS 87  BILITOT 0.5  PROT 8.1  ALBUMIN 4.5    Urine analysis:    Component Value Date/Time   COLORURINE YELLOW 12/28/2024 0110   APPEARANCEUR HAZY (Devron Cohick) 12/28/2024 0110   LABSPEC 1.015 12/28/2024 0110   PHURINE 6.5 12/28/2024 0110   GLUCOSEU NEGATIVE 12/28/2024 0110   HGBUR TRACE (Darienne Belleau) 12/28/2024 0110   BILIRUBINUR NEGATIVE 12/28/2024 0110   BILIRUBINUR NEGATIVE 02/18/2024 1210   KETONESUR 40 (Eusevio Schriver) 12/28/2024 0110   PROTEINUR NEGATIVE 12/28/2024 0110   UROBILINOGEN 0.2 02/18/2024 1210   NITRITE NEGATIVE 12/28/2024 0110   LEUKOCYTESUR  NEGATIVE 12/28/2024  0110    Radiological Exams on Admission: CT Angio Chest PE W and/or Wo Contrast Result Date: 12/28/2024 EXAM: CTA CHEST 12/27/2024 11:35:46 PM TECHNIQUE: CTA of the chest was performed after the administration of intravenous contrast. Multiplanar reformatted images are provided for review. MIP images are provided for review. Automated exposure control, iterative reconstruction, and/or weight based adjustment of the mA/kV was utilized to reduce the radiation dose to as low as reasonably achievable. COMPARISON: Chest CT 10/12/2024 CLINICAL HISTORY: Pulmonary embolism (PE) suspected, low to intermediate prob, positive D-dimer. FINDINGS: PULMONARY ARTERIES: Pulmonary arteries are adequately opacified for evaluation. There is Talene Glastetter small segmental pulmonary embolus in the right lower lobe. No other pulmonary embolism. Main pulmonary artery is normal in caliber. MEDIASTINUM: Calcific aortic atherosclerosis and coronary artery calcification. The heart and pericardium demonstrate no acute abnormality. No CT evidence of right heart strain. Right chest wall port-Starling Christofferson-cath terminates at the cavoatrial junction. Calcific aortic atherosclerosis is present. There is no acute aortic dissection, intramural hematoma, aortic ulceration, or rupture. LYMPH NODES: No mediastinal, hilar or axillary lymphadenopathy. LUNGS AND PLEURA: Unchanged region of scarring/fibrosis in the right upper lobe status post radiation. 4 mm right middle lobe perifissural nodule (. series 303 image 59). No focal consolidation or pulmonary edema. No evidence of pleural effusion or pneumothorax. UPPER ABDOMEN: Limited images of the upper abdomen are unremarkable. SOFT TISSUES AND BONES: No acute bone or soft tissue abnormality. IMPRESSION: 1. Small segmental pulmonary embolus in the right lower lobe. No CT evidence of right heart strain or other pulmonary embolism. 2. Unchanged region of scarring/fibrosis in the right upper lobe status post  radiation. Findings communicated to Dr. Wanita at 2:13 AM on 12/28/2024. Electronically signed by: Franky Stanford MD 12/28/2024 02:13 AM EST RP Workstation: [id number]   DG Chest Portable 1 View Result Date: 12/27/2024 CLINICAL DATA:  Altered mental status. EXAM: PORTABLE CHEST 1 VIEW COMPARISON:  06/28/2024, CT 10/12/2024 FINDINGS: Right chest port with tip overlying the SVC. The lungs are hyperinflated. Bronchial thickening. Periphery of the lung bases is excluded from the field of view. The heart is normal in size. Bilateral hilar prominence likely due to enlarged pulmonary arteries on CT. Fiducial marker with the nodular density in the right upper lobe. No evidence of acute airspace disease. No pneumothorax or large pleural effusion. IMPRESSION: 1. Hyperinflation and bronchial thickening. No evidence of acute airspace disease. 2. Fiducial marker with nodular density in the right upper lobe. Electronically Signed   By: Andrea Gasman M.D.   On: 12/27/2024 18:41   CT ANGIO HEAD NECK W WO CM W PERF (CODE STROKE) Result Date: 12/27/2024 EXAM: CTA Head and Neck with Perfusion 12/27/2024 05:39:38 PM TECHNIQUE: CTA of the head and neck was performed without and with the administration of 100 mL of iohexol  (OMNIPAQUE ) 350 MG/ML injection. 3D postprocessing with multiplanar reconstructions and MIPs was performed to evaluate the vascular anatomy. Cerebral perfusion analysis using computed tomography with contrast administration, including post-processing of parametric maps with determination of cerebral blood flow, cerebral blood volume, mean transit time and time-to-maximum. Automated exposure control, iterative reconstruction, and/or weight based adjustment of the mA/kV was utilized to reduce the radiation dose to as low as reasonably achievable. COMPARISON: CT head dated 12/27/2024 CLINICAL HISTORY: Neuro deficit, acute, stroke suspected FINDINGS: CTA NECK: AORTIC ARCH AND ARCH VESSELS: Mild atherosclerosis of  the partially visualized aortic arch. Atherosclerosis involves the aortic arch vessels without high grade stenosis. No dissection or arterial injury. No significant stenosis of the brachiocephalic or subclavian  arteries. CERVICAL CAROTID ARTERIES: Calcified atherosclerosis at the right carotid bifurcation without hemodynamically significant stenosis. Calcification at the left carotid bifurcation without hemodynamically significant stenosis. No dissection or arterial injury. CERVICAL VERTEBRAL ARTERIES: Atherosclerosis at the left vertebral artery origin resulting in moderate stenosis. Additional atherosclerosis at the right vertebral artery origin and along the right V1 segment resulting in mild stenosis. Atherosclerosis of the left V4 segment results in mild stenosis. No dissection or arterial injury. LUNGS AND MEDIASTINUM: There is abnormal soft tissue within the suprahilar aspect of the right upper lobe which is partially visualized and corresponds to findings from the CT chest on 10/12/2024. Scarring in the right upper lobe surrounds multiple upper lobe pulmonary artery branches. Treatment partially visualized right chest wall Port-Raliegh Scobie-Cath. SOFT TISSUES: There is Tiye Huwe 1.9 x 1.8 cm mass involving the left masseter muscle adjacent to the anterior aspect of the left parotid gland corresponding to lesion seen on the MRI from 11/17/2024. Finding could reflect Delenn Ahn metastatic lesion versus Jeanise Durfey primary parotid neoplasm. BONES: Degenerative changes in the visualized spine. Edentulous maxilla. CTA HEAD: ANTERIOR CIRCULATION: Atherosclerosis of the bilateral carotid siphons. There is mild stenosis of the bilateral cavernous and supraclinoid ICAs. No significant stenosis of the anterior cerebral arteries. No significant stenosis of the middle cerebral arteries. No aneurysm. POSTERIOR CIRCULATION: No significant stenosis of the posterior cerebral arteries. No significant stenosis of the basilar artery. No significant stenosis of the  vertebral arteries. No aneurysm. OTHER: No dural venous sinus thrombosis on this non-dedicated study. CT PERFUSION: EXAM QUALITY: The CT perfusion images are mildly degraded by motion artifact. Exam quality is adequate with diagnostic perfusion maps. Appropriate arterial inflow and venous outflow curves. CORE INFARCT (CBF<30% volume): 0 mL TOTAL HYPOPERFUSION (Tmax>6s volume): 7 mL PENUMBRA: Mismatch volume: 7 mL Mismatch ratio: Not applicable Location: These regions of elevated Tmax are primarily within the inferior aspects of the temporal lobes adjacent to the skull base and are favored to be artifactual. IMPRESSION: 1. No acute large vessel occlusion. 2. No evidence of ischemia by CT brain perfusion. 3. Moderate stenosis at the left vertebral artery origin. Otherwise no high-grade stenosis or aneurysm in the head or neck vessels. 4. Karsyn Jamie 1.9 cm left masseter/parotid-adjacent mass, which could reflect metastatic disease versus Montavious Wierzba primary parotid neoplasm. Unchanged from recent MRI. 5. Impression #1-2 discussed with Dr. Matthews at 5:47 PM on 12/27/24. Electronically signed by: Donnice Mania MD 12/27/2024 06:04 PM EST RP Workstation: [id number]   CT HEAD CODE STROKE WO CONTRAST` Result Date: 12/27/2024 EXAM: CT HEAD WITHOUT 12/27/2024 05:15:52 PM TECHNIQUE: CT of the head was performed without the administration of intravenous contrast. Automated exposure control, iterative reconstruction, and/or weight based adjustment of the mA/kV was utilized to reduce the radiation dose to as low as reasonably achievable. COMPARISON: MRI head 11/17/2024. CLINICAL HISTORY: Neuro deficit, acute, stroke suspected. FINDINGS: BRAIN AND VENTRICLES: No acute intracranial hemorrhage. No mass effect or midline shift. No extra-axial fluid collection. No evidence of acute infarct. No hydrocephalus. Patchy white matter hypodensities, compatible with chronic microvascular ischemic disease. Basal ganglia calcifications. Calcifications of the  dentate nuclei. Calcific atherosclerosis. Lesion in the anterior body of the right caudate nucleus seen on the prior MRI is not well evaluated on the current study. [place] Stroke Program Early CT (ASPECT) score: Ganglionic (caudate, internal capsule, lentiform nucleus, insula, M1-M3): 7 Supraganglionic (M4-M6): 3 Total: 10 ORBITS: No acute abnormality. SINUSES AND MASTOIDS: No acute abnormality. SOFT TISSUES AND SKULL: No acute skull fracture. No acute soft tissue abnormality. IMPRESSION: 1.  No acute intracranial abnormality. 2. ASPECTS score is 10. 3. Lesion in the anterior body of the right caudate nucleus described on the prior MRI is not well evaluated on the current CT. 4. Findings messaged to Dr. Rosemarie via the [facility] messaging system at 5:27 PM on 12/27/24. Electronically signed by: Donnice Mania MD 12/27/2024 05:28 PM EST RP Workstation: [id number]    EKG: Independently reviewed. Sinus tachycardia  Assessment/Plan Principal Problem:   Aphasia Active Problems:   Stroke-like symptoms    Assessment and Plan:  Stroke Like Symptoms Woke up around noon 12/29 with confusion, slurred speech, difficulty standing R sided weakness and expressive aphasia MRI with/without pending CTA head/neck without acute LVO, no evidence of ischemia by CT brain perfusion.  Moderate stennosis at L vertebral artery origin.   Echo A1c, LDL PT/OT/SLP Neurology consult appreciate assistance  Segmental Pulmonary Embolism Currently on heparin  Echo pending LE US    COPD Wheezing Scheduled and prn nebs If continued wheezing after nebs, will consider adding steroids Negative covid, flu, RSV  Metastatic Lung Cancer Follows with Dr. Sherrod, s/p first round of chemo 12/11 it looks like Sees Dr. Buckley for MRI 10/2024 with findings concerning for possible met Follows with Dr. Shannon from rad onc  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  Note, she has genomic data from [facility].  Suggestions regarding  medications noted.  She was on 80 mg lipitor and 40 mg protonix  prior to this admission.   Awaiting final med rec - her husband and daughter weren't able to report all meds.      DVT prophylaxis: heparin  gtt Code Status:   full  Family Communication:  Husband, daughter 12/30  Disposition Plan:   Patient is from:  home  Anticipated DC to:  Pending therapy  Anticipated DC date:  pending  Anticipated DC barriers: Pending neurology eval, therapy eval  Consults called:  neurology  Admission status:  obs   Severity of Illness: The appropriate patient status for this patient is OBSERVATION. Observation status is judged to be reasonable and necessary in order to provide the required intensity of service to ensure the patient's safety. The patient's presenting symptoms, physical exam findings, and initial radiographic and laboratory data in the context of their medical condition is felt to place them at decreased risk for further clinical deterioration. Furthermore, it is anticipated that the patient will be medically stable for discharge from the hospital within 2 midnights of admission.     Meliton Monte MD [facility]  How to contact the [facility] Attending or Consulting provider 7A - 7P or covering provider during after hours 7P -7A, for this patient?   Check the care team in [facility] and look for Alianny Toelle) attending/consulting [facility] provider listed and b) the [facility] team listed Log into [email or web address] and use 's universal password to access. If you do not have the password, please contact the hospital operator. Locate the [facility] provider you are looking for under [facility] and page to [place] number that you can be directly reached. If you still have difficulty reaching the provider, please page the [facility] (Director on Call) for the Hospitalists listed on amion for assistance.  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 Individuals with one no-function variant in combination with either one increased-, normal-,  or uncertain-function variant are known as decreased function transporters. There are non-genetic risk factors associated with an increased risk of SAMS, such as being age 34 years or older, small body frame, Asian ethnicity, female gender, kidney disease, and drug-drug interactions.  SLCO1B1 is known to transport Ly Wass number of endogenous and exogenous substances into the liver, where atorvastatin  is metabolized. Decreased function transport may lead to Dayona Shaheen reduction in metabolism and increased plasma levels, resulting in Suraya Vidrine possible risk of SAMS. These symptoms are not common, and they may vary from myalgia and myopathy to more severe symptoms, such as myonecrosis and rhabdomyolysis.  It is suggested by the [facility] to use Deara Bober maximum of 40 mg daily of atorvastatin . If doses greater than 40 mg daily are indicated, consider the addition of non-statin therapy. The usual dose of atorvastatin  is 10 to 80 mg daily.      [1]  Allergies Allergen Reactions   Other Swelling    HAIR DYE   "

## 2024-12-28 NOTE — Progress Notes (Signed)
 PHARMACY - ANTICOAGULATION CONSULT NOTE  Pharmacy Consult for Heparin  Indication: pulmonary embolus  Vital Signs: Temp: 97.4 F (36.3 C) (12/30 1039) Temp Source: Oral (12/30 1039) BP: 137/79 (12/30 1030) Pulse Rate: 101 (12/30 1030)  Labs: Recent Labs    12/27/24 1639 12/28/24 1105  HGB 11.2*  --   HCT 33.9*  --   PLT 417*  --   LABPROT 13.8  --   INR 1.0  --   HEPARINUNFRC  --  0.38  CREATININE 0.90  --     Estimated Creatinine Clearance: 53.1 mL/min (by C-G formula based on SCr of 0.9 mg/dL).  Assessment: 78 y.o. female admitted with possible CVA, found to have small RLL PE on chest CT. She was started on IV heparin . Initial heparin  level si therapeutic at 0.38. No bleeding or other issues noted.   Goal of Therapy:  Heparin  level 0.3-0.5 units/mL Monitor platelets by anticoagulation protocol: Yes   Plan:  Continue heparin  gtt 1000 units/hr Recheck heparin  level tonight to confirm dosing Daily heparin  level and CBC  Vernell Meier, PharmD, BCPS, BCEMP Clinical Pharmacist Please see AMION for all pharmacy numbers 12/28/2024 12:57 PM

## 2024-12-28 NOTE — Consult Note (Shared)
 NEUROLOGY CONSULT NOTE   Date of service: December 28, 2024 Patient Name: [name] MRN:  [id number] DOB:  1946-08-14 Chief Complaint: Acute onset aphasia and right-sided weakness Requesting Provider: Perri DELENA Meliton Mickey., *  History of Present Illness  [name] is a 78 y.o. 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  Patient's family reported that Monday, she complained of fatigue and headache and did not get out of bed.  Yesterday morning, she was found to be sleeping and when her family woke her up, she had difficulty speaking and was unable to walk.  Patient does not have clear recollection of the symptoms and cannot state why she is in the hospital.  She is noted to have mild to moderate word finding difficulties, right facial droop and right arm weakness.  Initial CT head was negative, and CT perfusion demonstrated no core penumbra or infarct.  She was noted to have contrast-enhancing area on MRI several weeks ago in the right caudate nucleus, and was undergoing workup of this lesion which was felt to be either a subacute stroke or a solitary metastasis.  Patient reports having a headache recently which worsens when she coughs but no other symptoms.  LKW: 12/28 2100 Modified rankin score: 2-Slight disability-UNABLE to perform all activities but does not need assistance IV Thrombolysis: No, outside of window EVT: No, no LVO  NIHSS components Score: Comment  1a Level of Conscious 0[x]  1[]  2[]  3[]      1b LOC Questions 0[]  1[x]  2[]       1c LOC Commands 0[x]  1[]  2[]       2 Best Gaze 0[x]  1[]  2[]       3 Visual 0[x]  1[]  2[]  3[]      4 Facial Palsy 0[]  1[x]  2[]  3[]      5a Motor Arm - left 0[x]  1[]  2[]  3[]  4[]  UN[]    5b Motor Arm - Right 0[]  1[]  2[x]  3[]  4[]  UN[]     6a Motor Leg - Left 0[x]  1[]  2[]  3[]  4[]  UN[]    6b Motor Leg - Right 0[x]  1[]  2[]  3[]  4[]  UN[]    7 Limb Ataxia 0[x]  1[]  2[]  UN[]      8 Sensory 0[x]  1[]  2[]  UN[]      9 Best Language 0[]  1[x]  2[]  3[]      10 Dysarthria 0[x]  1[]  2[]  UN[]      11 Extinct. and Inattention 0[x]  1[]  2[]       TOTAL:5       ROS  Comprehensive ROS performed and pertinent positives documented in HPI   Past History   Past Medical History:  Diagnosis Date   Allergy  09/30/1996   Arthritis    Asthma    Complication of anesthesia    COPD (chronic obstructive pulmonary disease) (HCC)    Diabetes (HCC)    High cholesterol    History of radiation therapy    Right Lung- 12/04/21-12/11/21- Dr. Lynwood Nasuti   Hypertension    Lung cancer [facility])    Neuromuscular disorder (HCC)    Pneumonia 09/10/2017   PONV (postoperative nausea and vomiting)    Stroke [facility]) 2007   mini strokes    Past Surgical History:  Procedure Laterality Date   ABDOMINAL HYSTERECTOMY  1995   BRONCHIAL BIOPSY  10/23/2021   Procedure: BRONCHIAL BIOPSIES;  Surgeon: Brenna Adine CROME,  DO;  Location: [facility] ENDOSCOPY;  Service: Pulmonary;;   BRONCHIAL BRUSHINGS  10/23/2021   Procedure: BRONCHIAL BRUSHINGS;  Surgeon: Brenna Adine CROME, DO;  Location: [facility] ENDOSCOPY;  Service: Pulmonary;;   BRONCHIAL NEEDLE ASPIRATION BIOPSY  10/23/2021   Procedure: BRONCHIAL NEEDLE ASPIRATION BIOPSIES;  Surgeon: Brenna Adine CROME, DO;  Location: [facility] ENDOSCOPY;  Service: Pulmonary;;   carpel tunnel surgery Right 2006   COLONOSCOPY  2014   COLOSTOMY  2007   colostomy let down  2008   endoscopy  2014   FIDUCIAL MARKER PLACEMENT  10/23/2021   Procedure: FIDUCIAL MARKER PLACEMENT;  Surgeon: Brenna Adine CROME, DO;  Location: [facility] ENDOSCOPY;  Service: Pulmonary;;   IR IMAGING GUIDED PORT INSERTION  12/03/2024   rotator cuff tear Left 04/14/2020   TUBAL LIGATION     VIDEO BRONCHOSCOPY WITH ENDOBRONCHIAL NAVIGATION Right 10/23/2021   Procedure: VIDEO BRONCHOSCOPY WITH  ENDOBRONCHIAL NAVIGATION;  Surgeon: Brenna Adine CROME, DO;  Location: [facility] ENDOSCOPY;  Service: Pulmonary;  Laterality: Right;  ION, w/ fiducial placement   VIDEO BRONCHOSCOPY WITH RADIAL ENDOBRONCHIAL ULTRASOUND  10/23/2021   Procedure: VIDEO BRONCHOSCOPY WITH RADIAL ENDOBRONCHIAL ULTRASOUND;  Surgeon: Brenna Adine CROME, DO;  Location: [facility] ENDOSCOPY;  Service: Pulmonary;;    Family History: Family History  Problem Relation Age of Onset   Heart murmur Mother    Heart attack Father    Diabetes Father    Diabetes Brother    Stroke Brother    Vision loss Brother    Vision loss Brother     Social History  reports that she quit smoking about 18 years ago. Her smoking use included cigarettes. She started smoking about 57 years ago. She has a 21 pack-year smoking history. She has never used smokeless tobacco. She reports that she does not currently use alcohol. She reports that she does not use drugs.  Allergies[1]  Medications  Current Medications[2]  Vitals   Vitals:   12/28/24 1349 12/28/24 1428 12/28/24 1526 12/28/24 1632  BP: (!) 149/70  (!) 159/88   Pulse: (!) 102  (!) 105   Resp: 18  18   Temp: (!) 97.5 F (36.4 C)  98.8 F (37.1 C)   TempSrc:      SpO2: 97% 96% 97%   Weight:    74.9 kg  Height:    5' 5 (1.651 m)    Body mass index is 27.48 kg/m.   Physical Exam   Constitutional: Appears well-developed and well-nourished.  Psych: Affect appropriate to situation.  Eyes: No scleral injection.  HENT: No OP obstruction.  Head: Normocephalic.  Respiratory: Effort normal, non-labored breathing.  Skin: WDI.   Neurologic Examination    NEURO:  Mental Status: Alert and oriented to person and place, gives month as January and cannot describe exactly what brought her to the hospital Speech/Language: speech is without dysarthria but with mild expressive aphasia.  Some difficulty and word finding is noted with intact naming and repetition, speech is nonfluent  Cranial  Nerves:  II: PERRL.  III, IV, VI: EOMI. Eyelids elevate symmetrically.  V: Sensation is intact to light touch and symmetrical to face.  VII: Subtle right facial droop VIII: hearing intact to voice. IX, X: Phonation is normal.  XII: tongue is midline without fasciculations. Motor: Able to move left upper and bilateral lower extremities with antigravity strength, can lift right upper extremity off the bed, but it swiftly falls back down Tone: is normal and bulk is normal Sensation- Intact to light touch bilaterally. Extinction absent  to light touch to DSS.  Coordination: FTN intact on the left, unable to perform on the right Gait- deferred   Labs/Imaging/Neurodiagnostic studies   CBC:  Recent Labs  Lab Jan 19, 2025 1639  WBC 3.6*  NEUTROABS 2.1  HGB 11.2*  HCT 33.9*  MCV 78.1*  PLT 417*   Basic Metabolic Panel:  Lab Results  Component Value Date   NA 137 01/19/2025   K 4.6 01-19-2025   CO2 27 01-19-2025   GLUCOSE 107 (H) Jan 19, 2025   BUN 15 01-19-2025   CREATININE 0.90 19-Jan-2025   CALCIUM  9.6 01/19/25   GFRNONAA >60 2025/01/19   GFRAA 81 01/04/2021   Lipid Panel:  Lab Results  Component Value Date   LDLCALC 24 11/08/2024   HgbA1c:  Lab Results  Component Value Date   HGBA1C 6.3 (H) 12/28/2024   Urine Drug Screen: No results found for: LABOPIA, COCAINSCRNUR, LABBENZ, AMPHETMU, THCU, LABBARB  Alcohol Level No results found for: [facility] INR  Lab Results  Component Value Date   INR 1.0 Jan 19, 2025   APTT  Lab Results  Component Value Date   APTT 25 09/10/2017   CT Head without contrast(Personally reviewed): No acute abnormality  CT angio Head and Neck with contrast and with perfusion (Personally reviewed): No LVO and no evidence of ischemia by CT brain perfusion, 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 female with  hx of stage IV lung cancer with mets to the liver, pancreatic tail, muscles, left parotid gland and possibly the brain, COPD, diabetes, hyperlipidemia and stroke who presents with acute onset aphasia, right-sided weakness with inability to walk and headache.  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  The location of this possible met does not correlate with her presenting symptoms.  On exam, she has mild word finding difficulty as well as weakness of the right upper extremity.  Will need brain MRI with and without contrast to determine if symptoms are caused by left MCA territory stroke or new brain metastasis.  RECOMMENDATIONS  -Brain MRI with and without contrast, begin stroke workup if stroke found and consult oncology if new brain metastases discovered - Will get routine EEG to evaluate for potential seizures. - Neurology will continue to follow ______________________________________________________________________  Patient seen by NP and then by MD, MD to edit note as needed.  Signed, Solina Heron E Everitt Clint Kill, NP [facility]   NEUROHOSPITALIST ADDENDUM Performed a face to face diagnostic evaluation.   I have reviewed the contents of history and physical exam as documented by PA/ARNP/Resident and agree with above documentation.  I have discussed and formulated the above plan as documented. Edits to the note have been made as needed.   Salman Khaliqdina, MD [facility] [phone]   If 7pm to 7am, please call on call as listed on AMION.     [1]  Allergies Allergen Reactions   Other Swelling    HAIR DYE  [2]  Current Facility-Administered Medications:    [START ON 12/29/2024]  stroke: early stages of recovery book, , Does not apply, Once, Perri DELENA Meliton Mickey., MD   0.9 %  sodium chloride  infusion, , Intravenous, Continuous, Perri DELENA Meliton Mickey., MD, Last Rate: 40 mL/hr at  12/28/24 1454, New Bag at 12/28/24 1454   acetaminophen  (TYLENOL ) tablet 650 mg, 650 mg,  Oral, Q4H PRN **OR** acetaminophen  (TYLENOL ) 160 MG/5ML solution 650 mg, 650 mg, Per Tube, Q4H PRN **OR** acetaminophen  (TYLENOL ) suppository 650 mg, 650 mg, Rectal, Q4H PRN, Perri DELENA Meliton Mickey., MD   albuterol  (PROVENTIL ) (2.5 MG/3ML) 0.083% nebulizer solution 2.5 mg, 2.5 mg, Nebulization, Q2H PRN, Perri DELENA Meliton Mickey., MD   albuterol  (PROVENTIL ) (2.5 MG/3ML) 0.083% nebulizer solution 2.5 mg, 2.5 mg, Nebulization, Q6H WA, Perri DELENA Meliton Mickey., MD   arformoterol  (BROVANA ) nebulizer solution 15 mcg, 15 mcg, Nebulization, BID, Perri DELENA Meliton Mickey., MD   NOREEN ON 12/29/2024] atorvastatin  (LIPITOR) tablet 80 mg, 80 mg, Oral, Daily, Perri DELENA Meliton Mickey., MD   bisoprolol  (ZEBETA ) tablet 2.5 mg, 2.5 mg, Oral, Daily, Perri DELENA Meliton Mickey., MD, 2.5 mg at 12/28/24 1554   budesonide  (PULMICORT ) nebulizer solution 0.25 mg, 0.25 mg, Nebulization, BID, Perri DELENA Meliton Mickey., MD   DULoxetine  (CYMBALTA ) DR capsule 60 mg, 60 mg, Oral, Daily, Perri DELENA Meliton Mickey., MD, 60 mg at 12/28/24 1554   heparin  ADULT infusion 100 units/mL (25000 units/250mL), 1,000 Units/hr, Intravenous, Continuous, Opyd, Evalene RAMAN, MD, Last Rate: 10 mL/hr at 12/28/24 0247, 1,000 Units/hr at 12/28/24 0247   pantoprazole  (PROTONIX ) EC tablet 40 mg, 40 mg, Oral, Daily, Perri DELENA Meliton Mickey., MD, 40 mg at 12/28/24 1554   pregabalin  (LYRICA ) capsule 100 mg, 100 mg, Oral, TID, Perri DELENA Meliton Mickey., MD, 100 mg at 12/28/24 1505   [START ON 12/29/2024] revefenacin  (YUPELRI ) nebulizer solution 175 mcg, 175 mcg, Nebulization, Daily, Perri DELENA Meliton Mickey., MD   senna-docusate (Senokot-S) tablet 1 tablet, 1 tablet, Oral, QHS PRN, Perri DELENA Meliton Mickey., MD

## 2024-12-28 NOTE — Plan of Care (Signed)

## 2024-12-29 ENCOUNTER — Observation Stay (HOSPITAL_COMMUNITY)

## 2024-12-29 ENCOUNTER — Inpatient Hospital Stay: Admitting: Physician Assistant

## 2024-12-29 ENCOUNTER — Other Ambulatory Visit: Payer: Self-pay | Admitting: Physician Assistant

## 2024-12-29 ENCOUNTER — Inpatient Hospital Stay

## 2024-12-29 ENCOUNTER — Observation Stay (HOSPITAL_BASED_OUTPATIENT_CLINIC_OR_DEPARTMENT_OTHER)

## 2024-12-29 ENCOUNTER — Telehealth (HOSPITAL_COMMUNITY): Payer: Self-pay

## 2024-12-29 ENCOUNTER — Other Ambulatory Visit (HOSPITAL_COMMUNITY): Payer: Self-pay

## 2024-12-29 DIAGNOSIS — C3490 Malignant neoplasm of unspecified part of unspecified bronchus or lung: Secondary | ICD-10-CM | POA: Diagnosis not present

## 2024-12-29 DIAGNOSIS — Z87891 Personal history of nicotine dependence: Secondary | ICD-10-CM | POA: Diagnosis not present

## 2024-12-29 DIAGNOSIS — Z7982 Long term (current) use of aspirin: Secondary | ICD-10-CM | POA: Diagnosis not present

## 2024-12-29 DIAGNOSIS — R410 Disorientation, unspecified: Secondary | ICD-10-CM | POA: Diagnosis not present

## 2024-12-29 DIAGNOSIS — R29810 Facial weakness: Secondary | ICD-10-CM | POA: Diagnosis present

## 2024-12-29 DIAGNOSIS — I2699 Other pulmonary embolism without acute cor pulmonale: Secondary | ICD-10-CM | POA: Diagnosis present

## 2024-12-29 DIAGNOSIS — Z794 Long term (current) use of insulin: Secondary | ICD-10-CM | POA: Diagnosis not present

## 2024-12-29 DIAGNOSIS — Z8249 Family history of ischemic heart disease and other diseases of the circulatory system: Secondary | ICD-10-CM | POA: Diagnosis not present

## 2024-12-29 DIAGNOSIS — S4991XD Unspecified injury of right shoulder and upper arm, subsequent encounter: Secondary | ICD-10-CM | POA: Diagnosis not present

## 2024-12-29 DIAGNOSIS — C7889 Secondary malignant neoplasm of other digestive organs: Secondary | ICD-10-CM | POA: Diagnosis present

## 2024-12-29 DIAGNOSIS — Z79899 Other long term (current) drug therapy: Secondary | ICD-10-CM | POA: Diagnosis not present

## 2024-12-29 DIAGNOSIS — Z1152 Encounter for screening for COVID-19: Secondary | ICD-10-CM | POA: Diagnosis not present

## 2024-12-29 DIAGNOSIS — C3491 Malignant neoplasm of unspecified part of right bronchus or lung: Secondary | ICD-10-CM

## 2024-12-29 DIAGNOSIS — Z923 Personal history of irradiation: Secondary | ICD-10-CM | POA: Diagnosis not present

## 2024-12-29 DIAGNOSIS — R569 Unspecified convulsions: Secondary | ICD-10-CM | POA: Diagnosis not present

## 2024-12-29 DIAGNOSIS — J441 Chronic obstructive pulmonary disease with (acute) exacerbation: Secondary | ICD-10-CM | POA: Diagnosis present

## 2024-12-29 DIAGNOSIS — Z86711 Personal history of pulmonary embolism: Secondary | ICD-10-CM

## 2024-12-29 DIAGNOSIS — R4701 Aphasia: Secondary | ICD-10-CM | POA: Diagnosis present

## 2024-12-29 DIAGNOSIS — I10 Essential (primary) hypertension: Secondary | ICD-10-CM | POA: Diagnosis present

## 2024-12-29 DIAGNOSIS — Z833 Family history of diabetes mellitus: Secondary | ICD-10-CM | POA: Diagnosis not present

## 2024-12-29 DIAGNOSIS — E7800 Pure hypercholesterolemia, unspecified: Secondary | ICD-10-CM | POA: Diagnosis present

## 2024-12-29 DIAGNOSIS — Z7951 Long term (current) use of inhaled steroids: Secondary | ICD-10-CM | POA: Diagnosis not present

## 2024-12-29 DIAGNOSIS — Z7985 Long-term (current) use of injectable non-insulin antidiabetic drugs: Secondary | ICD-10-CM | POA: Diagnosis not present

## 2024-12-29 DIAGNOSIS — C7989 Secondary malignant neoplasm of other specified sites: Secondary | ICD-10-CM | POA: Diagnosis present

## 2024-12-29 DIAGNOSIS — Z823 Family history of stroke: Secondary | ICD-10-CM | POA: Diagnosis not present

## 2024-12-29 DIAGNOSIS — C7931 Secondary malignant neoplasm of brain: Secondary | ICD-10-CM | POA: Diagnosis not present

## 2024-12-29 DIAGNOSIS — E119 Type 2 diabetes mellitus without complications: Secondary | ICD-10-CM | POA: Diagnosis present

## 2024-12-29 DIAGNOSIS — G9340 Encephalopathy, unspecified: Secondary | ICD-10-CM

## 2024-12-29 DIAGNOSIS — C787 Secondary malignant neoplasm of liver and intrahepatic bile duct: Secondary | ICD-10-CM | POA: Diagnosis present

## 2024-12-29 DIAGNOSIS — R200 Anesthesia of skin: Secondary | ICD-10-CM | POA: Diagnosis not present

## 2024-12-29 DIAGNOSIS — Z85118 Personal history of other malignant neoplasm of bronchus and lung: Secondary | ICD-10-CM | POA: Diagnosis not present

## 2024-12-29 DIAGNOSIS — G8929 Other chronic pain: Secondary | ICD-10-CM | POA: Diagnosis present

## 2024-12-29 DIAGNOSIS — Z8673 Personal history of transient ischemic attack (TIA), and cerebral infarction without residual deficits: Secondary | ICD-10-CM | POA: Diagnosis not present

## 2024-12-29 DIAGNOSIS — J449 Chronic obstructive pulmonary disease, unspecified: Secondary | ICD-10-CM | POA: Diagnosis not present

## 2024-12-29 DIAGNOSIS — F05 Delirium due to known physiological condition: Secondary | ICD-10-CM | POA: Diagnosis present

## 2024-12-29 LAB — CBC WITH DIFFERENTIAL/PLATELET
Abs Immature Granulocytes: 0.02 K/uL (ref 0.00–0.07)
Basophils Absolute: 0 K/uL (ref 0.0–0.1)
Basophils Relative: 1 %
Eosinophils Absolute: 0.1 K/uL (ref 0.0–0.5)
Eosinophils Relative: 1 %
HCT: 30.1 % — ABNORMAL LOW (ref 36.0–46.0)
Hemoglobin: 9.7 g/dL — ABNORMAL LOW (ref 12.0–15.0)
Immature Granulocytes: 1 %
Lymphocytes Relative: 32 %
Lymphs Abs: 1.2 K/uL (ref 0.7–4.0)
MCH: 25.5 pg — ABNORMAL LOW (ref 26.0–34.0)
MCHC: 32.2 g/dL (ref 30.0–36.0)
MCV: 79 fL — ABNORMAL LOW (ref 80.0–100.0)
Monocytes Absolute: 0.6 K/uL (ref 0.1–1.0)
Monocytes Relative: 16 %
Neutro Abs: 1.8 K/uL (ref 1.7–7.7)
Neutrophils Relative %: 49 %
Platelets: 456 K/uL — ABNORMAL HIGH (ref 150–400)
RBC: 3.81 MIL/uL — ABNORMAL LOW (ref 3.87–5.11)
RDW: 19 % — ABNORMAL HIGH (ref 11.5–15.5)
WBC: 3.6 K/uL — ABNORMAL LOW (ref 4.0–10.5)
nRBC: 0 % (ref 0.0–0.2)

## 2024-12-29 LAB — ECHOCARDIOGRAM COMPLETE
AR max vel: 3.04 cm2
AV Peak grad: 3 mmHg
Ao pk vel: 0.87 m/s
Area-P 1/2: 4.39 cm2
Height: 65 in
S' Lateral: 2.7 cm
Weight: 2641.99 [oz_av]

## 2024-12-29 LAB — LIPID PANEL
Cholesterol: 95 mg/dL (ref 0–200)
HDL: 45 mg/dL
LDL Cholesterol: 36 mg/dL (ref 0–99)
Total CHOL/HDL Ratio: 2.1 ratio
Triglycerides: 70 mg/dL
VLDL: 14 mg/dL (ref 0–40)

## 2024-12-29 LAB — COMPREHENSIVE METABOLIC PANEL WITH GFR
ALT: 15 U/L (ref 0–44)
AST: 36 U/L (ref 15–41)
Albumin: 3.6 g/dL (ref 3.5–5.0)
Alkaline Phosphatase: 63 U/L (ref 38–126)
Anion gap: 12 (ref 5–15)
BUN: 13 mg/dL (ref 8–23)
CO2: 25 mmol/L (ref 22–32)
Calcium: 8.8 mg/dL — ABNORMAL LOW (ref 8.9–10.3)
Chloride: 99 mmol/L (ref 98–111)
Creatinine, Ser: 0.65 mg/dL (ref 0.44–1.00)
GFR, Estimated: >60 mL/min
Glucose, Bld: 83 mg/dL (ref 70–99)
Potassium: 3.8 mmol/L (ref 3.5–5.1)
Sodium: 136 mmol/L (ref 135–145)
Total Bilirubin: 0.4 mg/dL (ref 0.0–1.2)
Total Protein: 6.5 g/dL (ref 6.5–8.1)

## 2024-12-29 LAB — MAGNESIUM: Magnesium: 2.3 mg/dL (ref 1.7–2.4)

## 2024-12-29 LAB — PHOSPHORUS: Phosphorus: 3.7 mg/dL (ref 2.5–4.6)

## 2024-12-29 LAB — HEPARIN LEVEL (UNFRACTIONATED): Heparin Unfractionated: 0.52 [IU]/mL (ref 0.30–0.70)

## 2024-12-29 MED ORDER — APIXABAN 5 MG PO TABS: 5.0000 mg | ORAL_TABLET | Freq: Two times a day (BID) | ORAL | Status: DC

## 2024-12-29 MED ORDER — APIXABAN 5 MG PO TABS
10.0000 mg | ORAL_TABLET | Freq: Two times a day (BID) | ORAL | Status: DC
  Administered 2024-12-29 – 2025-01-03 (×10): 10 mg via ORAL
  Filled 2024-12-29 (×10): qty 2

## 2024-12-29 NOTE — Progress Notes (Signed)
 " PROGRESS NOTE    [name]  FMW:[id number] DOB: [birth year] DOA: 12/27/2024 PCP: Tammy Medici, MD  Chief Complaint  Patient presents with   Altered Mental Status    Brief Narrative:   [name] is [name] 78 y.o. female with medical history significant of metastatic lung cancer, COPD, HTN and multiple other medical issues here with word finding issues, confusion, and slurred speech.   MRI without stroke, but notable for metastasis.    EEG pending.  Assessment & Plan:   Principal Problem:   Aphasia Active Problems:   Stroke-like symptoms  Stroke Like Symptoms Woke up around noon 12/29 with confusion, slurred speech, difficulty standing R sided weakness and expressive aphasia MRI with/without with right caudate nucleus nodule increased in size - no additional intracranial lesions identified CTA head/neck without acute LVO, no evidence of ischemia by CT brain perfusion.  Moderate stennosis at L vertebral artery origin.   Echo pending A1c 6.3, LDL 36 PT/OT/SLP Neurology consult appreciate assistance - plan is for EEG - ? Encephalopathy of unclear origin.     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  Suggestions regarding medications noted.  She was on 80 mg lipitor and 40 mg protonix  prior to this admission - will continue at  current doses despite suggestion to reduce dose.    Awaiting final med rec - her husband and daughter weren't able to report all meds.  Med rec pending.    DVT prophylaxis: heparin  gtt Code Status: full Family Communication: husband, daughter 12/30 over phone Disposition:   Status is: Observation The patient remains OBS appropriate and will d/c before 2 midnights.   Consultants:  neurology  Procedures:  none  Antimicrobials:  Anti-infectives (From admission, onward)    None       Subjective: No new complaints Feeling better today  Objective: Vitals:   12/29/24 0203 12/29/24 0404 12/29/24 0743 12/29/24 1202  BP:  128/87 (!) 154/73 (!) 103/54  Pulse:  98 100 89  Resp:  18 17 17   Temp:  99.2 F (37.3 C) 99.3 F (37.4 C) 99.1 F (37.3 C)  TempSrc:  Oral Oral Oral  SpO2: 98% 98% 97% 97%  Weight:      Height:        Intake/Output Summary (Last 24 hours) at 12/29/2024 1255 Last data filed at 12/29/2024 9447 Gross per 24 hour  Intake 796.77 ml  Output --  Net 796.77 ml   Filed Weights   12/28/24 1632  Weight: 74.9 kg    Examination:  General exam: Appears calm and comfortable  Respiratory system: unlabored Cardiovascular system: RRR Central nervous system: improved speech today, RUE weakness is likely due to rotator cuff issue based on discussion Extremities: no LEE    Data Reviewed: I have personally reviewed following labs and imaging studies  CBC: Recent Labs  Lab 12/27/24 1639  12/29/24 0441  WBC 3.6* 3.6*  NEUTROABS 2.1 1.8  HGB 11.2* 9.7*  HCT 33.9* 30.1*  MCV 78.1* 79.0*  PLT 417* 456*    Basic Metabolic Panel: Recent Labs  Lab 12/27/24 1639 12/29/24 0441  NA 137 136  K 4.6 3.8  CL 96* 99  CO2 27 25  GLUCOSE 107* 83  BUN 15 13  CREATININE 0.90 0.65  CALCIUM  9.6 8.8*  MG  --  2.3  PHOS  --  3.7    GFR: Estimated Creatinine Clearance: 59.7 mL/min (by C-G formula based on SCr of 0.65 mg/dL).  Liver Function  Tests: Recent Labs  Lab 12/27/24 1639 12/29/24 0441  AST 30 36  ALT 16 15  ALKPHOS 87 63  BILITOT 0.5 0.4  PROT 8.1 6.5  ALBUMIN 4.5 3.6    CBG: Recent Labs  Lab 12/27/24 1616 12/28/24 1525  GLUCAP 138* 96     Recent Results (from the past 240 hours)  Resp panel by RT-PCR (RSV, Flu [name]&B, Covid) Anterior Nasal Swab     Status: None   Collection Time: 12/27/24  4:40 PM   Specimen: Anterior Nasal Swab  Result Value Ref Range Status   SARS Coronavirus 2 by RT PCR NEGATIVE NEGATIVE Final    Comment: (NOTE) SARS-CoV-2 target nucleic acids are NOT DETECTED.  The SARS-CoV-2 RNA is generally detectable in upper respiratory specimens during the acute phase of infection. The lowest concentration of SARS-CoV-2 viral copies this assay can detect is 138 copies/mL. [name] negative result does not preclude SARS-Cov-2 infection and should not be used as the sole basis for treatment or other patient management decisions. [name] negative result may occur with  improper specimen collection/handling, submission of specimen other than nasopharyngeal swab, presence of viral mutation(s) within the areas targeted by this assay, and inadequate number of viral copies(<138 copies/mL). [name] negative result must be combined with clinical observations, patient history, and epidemiological information. The expected result is Negative.  Fact Sheet for Patients:  bloggercourse.com  Fact Sheet for Healthcare Providers:  seriousbroker.it  This test is no t yet approved or cleared by the [place]  FDA and  has been authorized for detection and/or diagnosis of SARS-CoV-2 by FDA under an Emergency Use Authorization (EUA). This EUA will remain  in effect (meaning this test can be used) for the duration of the COVID-19 declaration under Section 564(b)(1) of the Act, 21 U.S.C.section 360bbb-3(b)(1), unless the authorization is terminated  or revoked sooner.        Influenza [name] by PCR NEGATIVE NEGATIVE Final   Influenza B by PCR NEGATIVE NEGATIVE Final    Comment: (NOTE) The Xpert Xpress SARS-CoV-2/FLU/RSV plus assay is intended as an aid in the diagnosis of influenza from Nasopharyngeal swab specimens and should not be used as [name] sole basis for treatment. Nasal washings and aspirates are unacceptable for Xpert Xpress SARS-CoV-2/FLU/RSV testing.  Fact Sheet for Patients: bloggercourse.com  Fact Sheet for Healthcare Providers: seriousbroker.it  This test is not yet approved or cleared by the [place]  FDA and has been authorized for detection and/or diagnosis of SARS-CoV-2 by FDA under an Emergency Use Authorization (EUA). This EUA will remain in effect (meaning this test can be used) for the duration of the COVID-19 declaration under Section 564(b)(1) of the Act, 21 U.S.C. section 360bbb-3(b)(1), unless the authorization is terminated or revoked.     Resp Syncytial Virus by PCR NEGATIVE NEGATIVE Final    Comment: (NOTE) Fact Sheet for Patients: bloggercourse.com  Fact Sheet for  Healthcare Providers: seriousbroker.it  This test is not yet approved or cleared by the [place]  FDA and has been authorized for detection and/or diagnosis of SARS-CoV-2 by FDA under an Emergency Use Authorization (EUA). This EUA will remain in effect (meaning this test can be used) for the duration of the COVID-19 declaration under Section 564(b)(1) of the Act, 21 U.S.C. section 360bbb-3(b)(1), unless the authorization is terminated or revoked.  Performed at [facility], [address] [place], [place] [address]          Radiology Studies: MR BRAIN W WO CONTRAST Result Date: 12/29/2024 EXAM: MRI BRAIN WITH AND WITHOUT CONTRAST 12/28/2024 09:18:00 PM TECHNIQUE: Multiplanar multisequence MRI of the head/brain was performed with and  without the administration of intravenous contrast. CONTRAST: 7 mL of Gadobutrol . COMPARISON: MR Head 11/17/2024. CLINICAL HISTORY: Neuro deficit, acute, stroke suspected. FINDINGS: BRAIN AND VENTRICLES: Nikitta Sobiech 3 to 4 mm nodule previously noted within the anterior body of the caudate nucleus on the right has increased to 4 to 5 mm in diameter and is concerning for Dayla Gasca metastasis. There are no additional lesions within the brain demonstrated, but the study is degraded by patient motion. There is extensive diffuse cerebral white matter disease again demonstrated. No acute infarct. No acute intracranial hemorrhage. No mass effect or midline shift. No hydrocephalus. The sella is unremarkable. Normal flow voids. ORBITS: No acute abnormality. SINUSES: No acute abnormality. BONES AND SOFT TISSUES: Normal bone marrow signal and enhancement. Colette Dicamillo circumscribed, peripherally enhancing mass is again seen along the lateral surface of the left masseter, measuring approximately 2.0 x 1.8 x 2.3 cm, unchanged from the previous study. IMPRESSION: 1. Right caudate nucleus nodule has mildly increased in size, concerning for metastasis; no additional intracranial lesions are identified, although evaluation is limited by patient motion. 2. Extensive diffuse cerebral white matter disease. 3. Circumscribed, peripherally enhancing mass along the lateral surface of the left masseter, unchanged from the prior study. The mass remains concerning for primary or metastatic neoplasm. Electronically signed by: Evalene Coho MD 12/29/2024 04:40 AM EST RP Workstation: [id number]   CT Angio Chest PE W and/or Wo Contrast Result Date: 12/28/2024 EXAM: CTA CHEST 12/27/2024 11:35:46 PM TECHNIQUE: CTA of the chest was performed after the administration of intravenous contrast. Multiplanar reformatted images are provided for review. MIP images are provided for review. Automated exposure control, iterative reconstruction, and/or weight based adjustment of the  mA/kV was utilized to reduce the radiation dose to as low as reasonably achievable. COMPARISON: Chest CT 10/12/2024 CLINICAL HISTORY: Pulmonary embolism (PE) suspected, low to intermediate prob, positive D-dimer. FINDINGS: PULMONARY ARTERIES: Pulmonary arteries are adequately opacified for evaluation. There is Myrna Vonseggern small segmental pulmonary embolus in the right lower lobe. No other pulmonary embolism. Main pulmonary artery is normal in caliber. MEDIASTINUM: Calcific aortic atherosclerosis and coronary artery calcification. The heart and pericardium demonstrate no acute abnormality. No CT evidence of right heart strain. Right chest wall port-Hildegarde Dunaway-cath terminates at the cavoatrial junction. Calcific aortic atherosclerosis is present. There is no acute aortic dissection, intramural hematoma, aortic ulceration, or rupture. LYMPH NODES: No mediastinal, hilar or axillary lymphadenopathy. LUNGS AND PLEURA: Unchanged region of scarring/fibrosis in the right upper lobe status post radiation. 4 mm right middle lobe perifissural nodule (. series 303 image 59). No focal consolidation or pulmonary edema. No evidence of pleural effusion or pneumothorax. UPPER ABDOMEN: Limited images of the upper abdomen are unremarkable. SOFT TISSUES AND BONES: No acute bone or soft tissue abnormality. IMPRESSION: 1. Small segmental pulmonary  embolus in the right lower lobe. No CT evidence of right heart strain or other pulmonary embolism. 2. Unchanged region of scarring/fibrosis in the right upper lobe status post radiation. Findings communicated to Dr. Wanita at 2:13 AM on 12/28/2024. Electronically signed by: Franky Stanford MD 12/28/2024 02:13 AM EST RP Workstation: [id number]   DG Chest Portable 1 View Result Date: 12/27/2024 CLINICAL DATA:  Altered mental status. EXAM: PORTABLE CHEST 1 VIEW COMPARISON:  06/28/2024, CT 10/12/2024 FINDINGS: Right chest port with tip overlying the SVC. The lungs are hyperinflated. Bronchial thickening. Periphery of  the lung bases is excluded from the field of view. The heart is normal in size. Bilateral hilar prominence likely due to enlarged pulmonary arteries on CT. Fiducial marker with the nodular density in the right upper lobe. No evidence of acute airspace disease. No pneumothorax or large pleural effusion. IMPRESSION: 1. Hyperinflation and bronchial thickening. No evidence of acute airspace disease. 2. Fiducial marker with nodular density in the right upper lobe. Electronically Signed   By: Andrea Gasman M.D.   On: 12/27/2024 18:41   CT ANGIO HEAD NECK W WO CM W PERF (CODE STROKE) Result Date: 12/27/2024 EXAM: CTA Head and Neck with Perfusion 12/27/2024 05:39:38 PM TECHNIQUE: CTA of the head and neck was performed without and with the administration of 100 mL of iohexol  (OMNIPAQUE ) 350 MG/ML injection. 3D postprocessing with multiplanar reconstructions and MIPs was performed to evaluate the vascular anatomy. Cerebral perfusion analysis using computed tomography with contrast administration, including post-processing of parametric maps with determination of cerebral blood flow, cerebral blood volume, mean transit time and time-to-maximum. Automated exposure control, iterative reconstruction, and/or weight based adjustment of the mA/kV was utilized to reduce the radiation dose to as low as reasonably achievable. COMPARISON: CT head dated 12/27/2024 CLINICAL HISTORY: Neuro deficit, acute, stroke suspected FINDINGS: CTA NECK: AORTIC ARCH AND ARCH VESSELS: Mild atherosclerosis of the partially visualized aortic arch. Atherosclerosis involves the aortic arch vessels without high grade stenosis. No dissection or arterial injury. No significant stenosis of the brachiocephalic or subclavian arteries. CERVICAL CAROTID ARTERIES: Calcified atherosclerosis at the right carotid bifurcation without hemodynamically significant stenosis. Calcification at the left carotid bifurcation without hemodynamically significant stenosis.  No dissection or arterial injury. CERVICAL VERTEBRAL ARTERIES: Atherosclerosis at the left vertebral artery origin resulting in moderate stenosis. Additional atherosclerosis at the right vertebral artery origin and along the right V1 segment resulting in mild stenosis. Atherosclerosis of the left V4 segment results in mild stenosis. No dissection or arterial injury. LUNGS AND MEDIASTINUM: There is abnormal soft tissue within the suprahilar aspect of the right upper lobe which is partially visualized and corresponds to findings from the CT chest on 10/12/2024. Scarring in the right upper lobe surrounds multiple upper lobe pulmonary artery branches. Treatment partially visualized right chest wall [place] Cassata-Cath. SOFT TISSUES: There is Haunani Dickard 1.9 x 1.8 cm mass involving the left masseter muscle adjacent to the anterior aspect of the left parotid gland corresponding to lesion seen on the MRI from 11/17/2024. Finding could reflect Facundo Allemand metastatic lesion versus Rhianna Raulerson primary parotid neoplasm. BONES: Degenerative changes in the visualized spine. Edentulous maxilla. CTA HEAD: ANTERIOR CIRCULATION: Atherosclerosis of the bilateral carotid siphons. There is mild stenosis of the bilateral cavernous and supraclinoid ICAs. No significant stenosis of the anterior cerebral arteries. No significant stenosis of the middle cerebral arteries. No aneurysm. POSTERIOR CIRCULATION: No significant stenosis of the posterior cerebral arteries. No significant stenosis of the basilar artery. No significant stenosis of the vertebral arteries. No aneurysm. OTHER:  No dural venous sinus thrombosis on this non-dedicated study. CT PERFUSION: EXAM QUALITY: The CT perfusion images are mildly degraded by motion artifact. Exam quality is adequate with diagnostic perfusion maps. Appropriate arterial inflow and venous outflow curves. CORE INFARCT (CBF<30% volume): 0 mL TOTAL HYPOPERFUSION (Tmax>6s volume): 7 mL PENUMBRA: Mismatch volume: 7 mL Mismatch ratio: Not  applicable Location: These regions of elevated Tmax are primarily within the inferior aspects of the temporal lobes adjacent to the skull base and are favored to be artifactual. IMPRESSION: 1. No acute large vessel occlusion. 2. No evidence of ischemia by CT brain perfusion. 3. Moderate stenosis at the left vertebral artery origin. Otherwise no high-grade stenosis or aneurysm in the head or neck vessels. 4. Sherrin Stahle 1.9 cm left masseter/parotid-adjacent mass, which could reflect metastatic disease versus Brookes Craine primary parotid neoplasm. Unchanged from recent MRI. 5. Impression #1-2 discussed with Dr. Matthews at 5:47 PM on 12/27/24. Electronically signed by: Donnice Mania MD 12/27/2024 06:04 PM EST RP Workstation: [id number]   CT HEAD CODE STROKE WO CONTRAST` Result Date: 12/27/2024 EXAM: CT HEAD WITHOUT 12/27/2024 05:15:52 PM TECHNIQUE: CT of the head was performed without the administration of intravenous contrast. Automated exposure control, iterative reconstruction, and/or weight based adjustment of the mA/kV was utilized to reduce the radiation dose to as low as reasonably achievable. COMPARISON: MRI head 11/17/2024. CLINICAL HISTORY: Neuro deficit, acute, stroke suspected. FINDINGS: BRAIN AND VENTRICLES: No acute intracranial hemorrhage. No mass effect or midline shift. No extra-axial fluid collection. No evidence of acute infarct. No hydrocephalus. Patchy white matter hypodensities, compatible with chronic microvascular ischemic disease. Basal ganglia calcifications. Calcifications of the dentate nuclei. Calcific atherosclerosis. Lesion in the anterior body of the right caudate nucleus seen on the prior MRI is not well evaluated on the current study. [place] Stroke Program Early CT (ASPECT) score: Ganglionic (caudate, internal capsule, lentiform nucleus, insula, M1-M3): 7 Supraganglionic (M4-M6): 3 Total: 10 ORBITS: No acute abnormality. SINUSES AND MASTOIDS: No acute abnormality. SOFT TISSUES AND SKULL: No acute skull  fracture. No acute soft tissue abnormality. IMPRESSION: 1. No acute intracranial abnormality. 2. ASPECTS score is 10. 3. Lesion in the anterior body of the right caudate nucleus described on the prior MRI is not well evaluated on the current CT. 4. Findings messaged to Dr. Rosemarie via the [facility] messaging system at 5:27 PM on 12/27/24. Electronically signed by: Donnice Mania MD 12/27/2024 05:28 PM EST RP Workstation: [id number]        Scheduled Meds:  albuterol   2.5 mg Nebulization Q6H WA   arformoterol   15 mcg Nebulization BID   atorvastatin   80 mg Oral Daily   bisoprolol   2.5 mg Oral Daily   budesonide  (PULMICORT ) nebulizer solution  0.25 mg Nebulization BID   DULoxetine   60 mg Oral Daily   pantoprazole   40 mg Oral Daily   pregabalin   100 mg Oral TID   revefenacin   175 mcg Nebulization Daily   Continuous Infusions:  sodium chloride  40 mL/hr at 12/28/24 2151   heparin  1,100 Units/hr (12/29/24 0755)     LOS: 0 days    Time spent: over 30 min     Meliton Monte, MD [facility]   To contact the attending provider between 7A-7P or the covering provider during after hours 7P-7A, please log into the web site [email or web address] and access using universal [place] password for that web site. If you do not have the password, please call the hospital operator.  12/29/2024, 12:55 PM    "

## 2024-12-29 NOTE — Care Management Obs Status (Signed)
 MEDICARE OBSERVATION STATUS NOTIFICATION   Patient Details  Name: [name] MRN: [id number] Date of Birth: 1946-04-29   Medicare Observation Status Notification Given:    Obs notice signed and a copy given.    Balthazar Dooly 12/29/2024, 11:51 AM

## 2024-12-29 NOTE — Telephone Encounter (Signed)
 Pharmacy Patient Advocate Encounter  Insurance verification completed.    The patient is insured through [employer]. Patient has Medicare and is not eligible for a copay card, but may be able to apply for patient assistance or Medicare RX Payment Plan (Patient Must reach out to their plan, if eligible for payment plan), if available.    Ran test claim for Eliquis 5mg  tablet and the current 30 day co-pay is $0.   This test claim was processed through [facility]- copay amounts may vary at other pharmacies due to [employer], or as the patient moves through the different stages of their insurance plan.

## 2024-12-29 NOTE — Progress Notes (Signed)
 EEG is negative, suspect this is resolving delirium. With her improvement, I doubt there is significant yield to further testing, but if she re-worsens, or if other concerns remain, neurology will be available as needed.   Tammy Seals, MD [facility]   If 7pm- 7am, please page neurology on call as listed in AMION.

## 2024-12-29 NOTE — Progress Notes (Signed)
 NEUROLOGY CONSULT FOLLOW UP NOTE   Date of service: December 29, 2024 Patient Name: [name] MRN:  [id number] DOB:  12-26-1946  Interval Hx/subjective   She continues to have mild difficulty with her thoughts, but feels better than she was yesterday. Vitals   Vitals:   12/28/24 2300 12/29/24 0203 12/29/24 0404 12/29/24 0743  BP: 139/74  128/87 (!) 154/73  Pulse: 98  98 100  Resp: 18  18 17   Temp: 98.5 F (36.9 C)  99.2 F (37.3 C) 99.3 F (37.4 C)  TempSrc: Oral  Oral Oral  SpO2: 100% 98% 98% 97%  Weight:      Height:         Body mass index is 27.48 kg/m.  Physical Exam   Constitutional: Appears well-developed and well-nourished.  Neurologic Examination    MS: Awake, alert, she is able to repeat without difficulty and able to name objects without difficulty.  She occasionally will stumble over words, but not clear aphasia to me.  She is unable to spell world backwards, and the number of quarters in $2.75 is 7 CN: Pupils equal round and reactive, EOMI, I do question a mild facial asymmetry Motor: She has significant restriction in her right upper extremity due to shoulder injury, she has good strength distally. Sensory: Intact to light touch  Medications Current Medications[1]  Labs and Diagnostic Imaging   LDL 36 Ammonia less than 13 TSH is 0.93  Imaging(Personally reviewed): MRI brain - negative  Assessment   [name] is a 78 y.o. female with a history of metastatic lung cancer, with a single brain metastasis who presents with confusion and weakness.  She had had a headache on admission and appears to be gradually improving.  She was symptomatic for long enough that I would expect MRI to be positive, so a negative MRI I think rules out a cerebrovascular cause of this.  With her improvement, I do think an EEG would be reasonable, but if this is negative I do not think I would do antiepileptic therapy as there is no good evidence for that.  I  think this is more likely encephalopathy of unclear origin at this time, I wonder if she had some type of viral syndrome resulting in a mild delirium which is gradually improving.  She does appear to have mild right facial weakness, I question whether there was a mild asymmetry in her epic photo as well and so wonder if this is more of a worsening of an underlying deficit as opposed to a brand-new deficit.  Her right arm weakness appears to be mostly due to her shoulder issues, without distal weakness.  Another consideration would be if she took too much of her Lyrica  or other pain medication in response to the headache that she was experiencing.  Recommendations  EEG Continue Lyrica  at home dose of 100 mg 3 times daily ______________________________________________________________________   Signed, Aisha Seals, MD [facility]      [1]  Current Facility-Administered Medications:    0.9 %  sodium chloride  infusion, , Intravenous, Continuous, Perri DELENA Meliton Mickey., MD, Last Rate: 40 mL/hr at 12/28/24 2151, Restarted at 12/28/24 2151   acetaminophen  (TYLENOL ) tablet 650 mg, 650 mg, Oral, Q4H PRN **OR** acetaminophen  (TYLENOL ) 160 MG/5ML solution 650 mg, 650 mg, Per Tube, Q4H PRN **OR** acetaminophen  (TYLENOL ) suppository 650 mg, 650 mg, Rectal, Q4H PRN, Perri DELENA Meliton Mickey., MD   albuterol  (PROVENTIL ) (2.5 MG/3ML) 0.083% nebulizer solution 2.5 mg, 2.5 mg, Nebulization,  Q2H PRN, Perri DELENA Meliton Mickey., MD   albuterol  (PROVENTIL ) (2.5 MG/3ML) 0.083% nebulizer solution 2.5 mg, 2.5 mg, Nebulization, Q6H WA, Perri DELENA Meliton Mickey., MD, 2.5 mg at 12/29/24 9240   arformoterol  (BROVANA ) nebulizer solution 15 mcg, 15 mcg, Nebulization, BID, Perri DELENA Meliton Mickey., MD, 15 mcg at 12/29/24 0800   atorvastatin  (LIPITOR) tablet 80 mg, 80 mg, Oral, Daily, Perri DELENA Meliton Mickey., MD, 80 mg at 12/29/24 9183   bisoprolol  (ZEBETA ) tablet 2.5 mg, 2.5 mg, Oral, Daily, Perri DELENA Meliton Mickey.,  MD, 2.5 mg at 12/29/24 9183   budesonide  (PULMICORT ) nebulizer solution 0.25 mg, 0.25 mg, Nebulization, BID, Perri DELENA Meliton Mickey., MD, 0.25 mg at 12/29/24 0800   DULoxetine  (CYMBALTA ) DR capsule 60 mg, 60 mg, Oral, Daily, Perri DELENA Meliton Mickey., MD, 60 mg at 12/29/24 0816   heparin  ADULT infusion 100 units/mL (25000 units/250mL), 1,100 Units/hr, Intravenous, Continuous, Lyle Kava A, RPH, Last Rate: 11 mL/hr at 12/29/24 0755, 1,100 Units/hr at 12/29/24 0755   pantoprazole  (PROTONIX ) EC tablet 40 mg, 40 mg, Oral, Daily, Perri DELENA Meliton Mickey., MD, 40 mg at 12/29/24 9183   pregabalin  (LYRICA ) capsule 100 mg, 100 mg, Oral, TID, Perri DELENA Meliton Mickey., MD, 100 mg at 12/29/24 9183   revefenacin  (YUPELRI ) nebulizer solution 175 mcg, 175 mcg, Nebulization, Daily, Perri DELENA Meliton Mickey., MD, 175 mcg at 12/29/24 0759   senna-docusate (Senokot-S) tablet 1 tablet, 1 tablet, Oral, QHS PRN, Perri DELENA Meliton Mickey., MD

## 2024-12-29 NOTE — Progress Notes (Signed)
 Routine EEG complete. Results pending.

## 2024-12-29 NOTE — Evaluation (Signed)
 Speech Language Pathology Evaluation Patient Details Name: [name] MRN: [id number] DOB: 1946-08-21 Today's Date: 12/29/2024 Time: 0850-0910 SLP Time Calculation (min) (ACUTE ONLY): 20 min  Problem List:  Patient Active Problem List   Diagnosis Date Noted   Stroke-like symptoms 12/28/2024   Aphasia 12/27/2024   Encounter for antineoplastic chemotherapy 12/09/2024   Encounter for antineoplastic immunotherapy 12/09/2024   Brain lesion 12/02/2024   Hypertensive heart disease with chronic diastolic congestive heart failure (HCC) 11/14/2024   Metastatic adenocarcinoma (HCC) 11/14/2024   Need for influenza vaccination 09/14/2024   Class 1 obesity due to excess calories with body mass index (BMI) of 30.0 to 30.9 in adult 09/14/2024   Neck pain, musculoskeletal 09/14/2024   Mass of jaw 09/13/2024   Preauricular lymphadenopathy 09/09/2024   Vocal cord dysfunction 07/12/2024   History of lung cancer 07/12/2024   Muscle cramps 07/12/2024   HLD (hyperlipidemia) 06/29/2024   GERD (gastroesophageal reflux disease) 06/29/2024   Chronic diastolic CHF (congestive heart failure) (HCC) 06/29/2024   Acute hypoxic respiratory failure (HCC) 06/28/2024   Night sweats 06/05/2024   Estrogen deficiency 05/31/2024   Asthmatic bronchitis , chronic (HCC) 03/17/2024   Encounter for general adult medical examination w/o abnormal findings 02/18/2024   Fecal smearing 10/20/2023   Malnutrition of moderate degree 12/14/2022   Chronic pain 12/11/2022   Dyslipidemia associated with type 2 diabetes mellitus (HCC) 08/10/2022   Adenocarcinoma, lung, right (HCC) 05/28/2022   Hypertensive heart disease without heart failure 12/31/2021   Atherosclerosis of aorta 09/16/2021   Atherosclerosis 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 09/16/2021   Myalgia 09/16/2021   Solitary pulmonary nodule on lung CT 08/24/2021   Chest tightness 04/18/2021   Former smoker 08/21/2020   Pure  hypercholesterolemia    Type 2 diabetes mellitus with hyperglycemia (HCC) 11/09/2018   Diarrhea    Rhinovirus infection 10/02/2016   Acute asthma exacerbation 10/02/2016   COPD with acute exacerbation (HCC) 09/28/2016   SOB (shortness of breath) 09/28/2016   Essential hypertension 11/03/2014   Cough 10/19/2014   COPD GOLD II  03/21/2014   Rhinitis, nonallergic 03/21/2014   Past Medical History:  Past Medical History:  Diagnosis Date   Allergy  09/30/1996   Arthritis    Asthma    Complication of anesthesia    COPD (chronic obstructive pulmonary disease) (HCC)    Diabetes (HCC)    High cholesterol    History of radiation therapy    Right Lung- 12/04/21-12/11/21- Dr. Lynwood Nasuti   Hypertension    Lung cancer [facility])    Neuromuscular disorder (HCC)    Pneumonia 09/10/2017   PONV (postoperative nausea and vomiting)    Stroke (HCC) 2007   mini strokes   Past Surgical History:  Past Surgical History:  Procedure Laterality Date   ABDOMINAL HYSTERECTOMY  1995   BRONCHIAL BIOPSY  10/23/2021   Procedure: BRONCHIAL BIOPSIES;  Surgeon: Brenna Adine CROME, DO;  Location: [facility];  Service: Pulmonary;;   BRONCHIAL BRUSHINGS  10/23/2021   Procedure: BRONCHIAL BRUSHINGS;  Surgeon: Brenna Adine CROME, DO;  Location: [facility];  Service: Pulmonary;;   BRONCHIAL NEEDLE ASPIRATION BIOPSY  10/23/2021   Procedure: BRONCHIAL NEEDLE ASPIRATION BIOPSIES;  Surgeon: Brenna Adine CROME, DO;  Location: [facility];  Service: Pulmonary;;   carpel tunnel surgery Right 2006   COLONOSCOPY  2014   COLOSTOMY  2007   colostomy let down  2008   endoscopy  2014   FIDUCIAL MARKER PLACEMENT  10/23/2021   Procedure: FIDUCIAL  MARKER PLACEMENT;  Surgeon: Brenna Adine CROME, DO;  Location: [facility];  Service: Pulmonary;;   IR IMAGING GUIDED PORT INSERTION  12/03/2024   rotator cuff tear Left 04/14/2020   TUBAL LIGATION     VIDEO BRONCHOSCOPY WITH ENDOBRONCHIAL NAVIGATION Right 10/23/2021   Procedure: VIDEO  BRONCHOSCOPY WITH ENDOBRONCHIAL NAVIGATION;  Surgeon: Brenna Adine CROME, DO;  Location: [facility];  Service: Pulmonary;  Laterality: Right;  ION, w/ fiducial placement   VIDEO BRONCHOSCOPY WITH RADIAL ENDOBRONCHIAL ULTRASOUND  10/23/2021   Procedure: VIDEO BRONCHOSCOPY WITH RADIAL ENDOBRONCHIAL ULTRASOUND;  Surgeon: Brenna Adine CROME, DO;  Location: [facility];  Service: Pulmonary;;   HPI:  [name] is a 78 y.o. 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  MRI reports Right caudate nucleus nodule has mildly increased in size, concerning for metastasis; no additional intracranial lesions are identified   Assessment / Plan / Recommendation Clinical Impression  Patient was evaluated via informal measures to assess cognitive linguistic functioning. Patient reports living at home with husband and completed iADLS PTA. Patient oriented to self, location and time - though unsure of true medical situation and reason for admission. Patient reports changes in expressive language, though with no instances of overt word finding difficulty during conversation. Confrontational and responsive naming intact. Repetition intact.  Receptive language is remarkable for 100% accuracy during simple and complex yes/no questions and following 1-3 step commands. Patient with functional ability to solve daily problems and great sustained attention. Patient does report some difficulty with short term memory, though this is baseline. Patient does not require further ST services as she is at baseline level of cognitive linguistic functioning.     SLP Assessment  SLP Recommendation/Assessment: Patient does not need any further Speech Language Pathology Services     Assistance Recommended at Discharge   Intermittent Supervision/Assistance  Functional Status Assessment Patient has not had a recent decline in their functional status     SLP Evaluation Cognition  Overall Cognitive Status: History of cognitive impairments - at baseline Arousal/Alertness: Awake/alert Orientation Level: Oriented X4 Year: 2025 Month: December Day of Week: Correct Attention: Focused;Sustained Focused Attention: Appears intact Sustained Attention: Appears intact Memory: Impaired (baseline) Awareness: Appears intact Problem Solving: Appears intact (functional) Safety/Judgment: Appears intact       Comprehension  Auditory Comprehension Overall Auditory Comprehension: Appears within functional limits for tasks assessed Yes/No Questions: Within Functional Limits Commands: Within Functional Limits Conversation: Complex    Expression Expression Primary Mode of Expression: Verbal Verbal Expression Overall Verbal Expression: Appears within functional limits for tasks assessed Initiation: No impairment Automatic Speech: Name;Social Response;Counting;Day of week Level of Generative/Spontaneous Verbalization: Conversation Repetition: No impairment Naming: No impairment   Oral / Motor  Oral Motor/Sensory Function Overall Oral Motor/Sensory Function: Within functional limits Motor Speech Overall Motor Speech: Appears within functional limits for tasks assessed            Lillieann Pavlich M.A., CCC-SLP 12/29/2024, 9:33 AM

## 2024-12-29 NOTE — Plan of Care (Signed)

## 2024-12-29 NOTE — Procedures (Signed)
 Patient Name: SHAQUOYA COSPER  MRN: [id number]  Epilepsy Attending: Arlin MALVA Krebs  Referring Physician/Provider: Michaela Aisha SQUIBB, MD  Date: 12/29/2024 Duration: 22.47 mins  Patient history: 78 y.o. female with a history of metastatic lung cancer, with a single brain metastasis who presents with confusion. EEG to evaluate for seizure  Level of alertness: Awake  AEDs during EEG study: PGB  Technical aspects: This EEG study was done with scalp electrodes positioned according to the 10-20 International system of electrode placement. Electrical activity was reviewed with band pass filter of 1-70Hz , sensitivity of 7 uV/mm, display speed of 37mm/sec with a 60Hz  notched filter applied as appropriate. EEG data were recorded continuously and digitally stored.  Video monitoring was available and reviewed as appropriate.  Description: The posterior dominant rhythm consists of 8Hz  activity of moderate voltage (25-35 uV) seen predominantly in posterior head regions, symmetric and reactive to eye opening and eye closing. EEG showed intermittent generalized 3 to 6 Hz theta-delta slowing. Hyperventilation and photic stimulation were not performed.     ABNORMALITY - Intermittent slow, generalized  IMPRESSION: This study is suggestive of generalized cerebral dysfunction (encephalopathy). No seizures or definite epileptiform discharges were seen throughout the recording.   Governor Matos O Greer Wainright

## 2024-12-29 NOTE — Progress Notes (Signed)
 PHARMACY - ANTICOAGULATION CONSULT NOTE  Pharmacy Consult for Heparin  Indication: pulmonary embolus  Vital Signs: Temp: 99.2 F (37.3 C) (12/31 0404) Temp Source: Oral (12/31 0404) BP: 128/87 (12/31 0404) Pulse Rate: 98 (12/31 0404)  Labs: Recent Labs    12/27/24 1639 12/28/24 1105 12/28/24 1724 12/28/24 1924 12/29/24 0441  HGB 11.2*  --   --   --  9.7*  HCT 33.9*  --   --   --  30.1*  PLT 417*  --   --   --  456*  LABPROT 13.8  --   --   --   --   INR 1.0  --   --   --   --   HEPARINUNFRC  --    < > <0.10* <0.10* 0.52  CREATININE 0.90  --   --   --  0.65   < > = values in this interval not displayed.    Estimated Creatinine Clearance: 59.7 mL/min (by C-G formula based on SCr of 0.65 mg/dL).  Assessment: 78 y.o. female admitted with possible CVA, found to have small RLL PE on chest CT. She was started on IV heparin . Initial heparin  level si therapeutic at 0.38. No bleeding or other issues noted.   12/31 AM update: HL now 0.52 (slightly above range) biut Hgb has decreased from 11.2>>9.7. Will decrease slightly  Goal of Therapy:  Heparin  level 0.3-0.5 units/mL Monitor platelets by anticoagulation protocol: Yes   Plan:  Decrease heparin  gtt to 1100 units/hr Recheck heparin  level in 8hr and daily Daily heparin  level and CBC  Toretto Tingler A. Lyle, PharmD, BCPS, FNKF Clinical Pharmacist [place] Please utilize Amion for appropriate phone number to reach the unit pharmacist [facility])  12/29/2024 7:17 AM

## 2024-12-29 NOTE — Progress Notes (Addendum)
 Transition of Care [facility]) - Inpatient Brief Assessment   Patient Details  Name: [name] MRN: [id number] Date of Birth: [birth year]  Transition of [facility]) CM/SW Contact:    [name], RN Phone Number: 12/29/2024, 4:26 PM   Clinical Narrative: CM attempted to speak with the patient at the bedside but patient was sleeping and did not awaken at this time.  No family present in the hospital room at this time.  Patient admitted to the hospital for work-finding issues and confusion.  Patient is currently pending neurology consult and EEG per MD notes.  I called and left a message with the patient's spouse since I was unable to reach him by phone.  CIR noted that medical work up is still pending at this time.  IP Care management will continue to follow the patient as she progresses.  Patient is poor historian and I was unable to reach family in the room today nor by phone.  12/29/24 1645 - I called and spoke with the patient's wife by phone and he states that he hopes to take the patient home.  The spouse was agreeable to home health services and was offered Medicare choice regarding home health and patient's spouse chose [facility].  HH orders for PT, OT and MD to co-sign.  The spouse states that patient has RW at home and was agreeable to order 3:1.  I called [employer] and asked that 3:1 be delivered to the bedside today.  DMe order placed to be co-signed by MD.  Patient should discharge home tomorrow by car.   Transition of Care Asessment: Insurance and Status: (P) Insurance coverage has been reviewed Patient has primary care physician: (P) Yes Home environment has been reviewed: (P) from home with spouse Prior level of function:: (P) self/ family assistance Prior/Current Home Services: (P) No current home services Social Drivers of Health Review: (P) SDOH reviewed needs interventions Readmission risk has been reviewed: (P) Yes Transition of care needs: (P)  transition of care needs identified, TOC will continue to follow

## 2024-12-29 NOTE — Evaluation (Addendum)
 Physical Therapy Evaluation Patient Details Name: [name] MRN: [id number] DOB: [birth year] Today's Date: 12/29/2024  History of Present Illness  Pt is a 78 y/o F admitted on 12/27/24 after presenting with c/o word finding issues, confusion, slurred speech. Brain MRI concerning for metastasis. Pt also found to have small segmental PE in R lower lobe. PMH: metastatic lung CA, COPD, HTN,, arthritis, asthma, DM, high cholesterol, neuromuscular disorder, PNA, stroke  Clinical Impression  Pt seen for PT evaluation with pt agreeable, received impulsively transferring OOB to standard chair in room. Pt with incontinent BM, reporting she had an accident. Pt is a poor historian, AxOx3, with poor memory, awareness, recall. Pt required max supervision & cuing re: peri hygiene & changing into clean gown. Pt is able to transfer sit>stand with min assist<>CGA, requires UE support, CGA<>min assist to ambulate very short distance without AD. Pt tolerates standing ~2 min at a time for peri & hand hygiene. Pt notes fatigue at end of session which is not pt's baseline (pt reports she's independent without AD, driving). Recommend ongoing PT services to maximize independence with mobility & reduce fall risk. If pt were to d/c home she would require 24 hr supervision for safety, especially with higher level cognitive & balance activities.   MD cleared pt for participation in PT on this date.      If plan is discharge home, recommend the following: A little help with bathing/dressing/bathroom;Assistance with cooking/housework;A little help with walking and/or transfers;Assist for transportation;Help with stairs or ramp for entrance;Direct supervision/assist for medications management;Supervision due to cognitive status;Direct supervision/assist for financial management   Can travel by private vehicle        Equipment Recommendations Rolling walker (2 wheels);BSC/3in1  Recommendations for Other Services        Functional Status Assessment Patient has had a recent decline in their functional status and demonstrates the ability to make significant improvements in function in a reasonable and predictable amount of time.     Precautions / Restrictions Precautions Precautions: Fall Restrictions Weight Bearing Restrictions Per Provider Order: No      Mobility  Bed Mobility               General bed mobility comments: not tested, pt received OOB & left in chair    Transfers Overall transfer level: Needs assistance Equipment used: None Transfers: Sit to/from Stand, Bed to chair/wheelchair/BSC Sit to Stand: Min assist, Contact guard assist (pulls to stand with BUE on sink)   Step pivot transfers: Min assist (no AD)            Ambulation/Gait Ambulation/Gait assistance: Contact guard assist, Min assist Gait Distance (Feet): 5 Feet (+ 5 ft) Assistive device: None Gait Pattern/deviations: Decreased step length - right, Decreased step length - left, Decreased stride length Gait velocity: decreased     General Gait Details: bed>sink>recliner  Stairs            Wheelchair Mobility     Tilt Bed    Modified Rankin (Stroke Patients Only)       Balance Overall balance assessment: Needs assistance Sitting-balance support: Feet supported Sitting balance-Leahy Scale: Good     Standing balance support: During functional activity, No upper extremity supported Standing balance-Leahy Scale: Poor                               Pertinent Vitals/Pain Pain Assessment Pain Assessment: No/denies pain    Home Living  Family/patient expects to be discharged to:: Private residence Living Arrangements: Spouse/significant other;Children Available Help at Discharge: Family Type of Home: House         Home Layout: Multi-level        Prior Function Prior Level of Function : Patient poor historian/Family not available             Mobility Comments: Pt  reports she's ambulates without AD ADLs Comments: driving, independent     Extremity/Trunk Assessment   Upper Extremity Assessment Upper Extremity Assessment: Overall WFL for tasks assessed    Lower Extremity Assessment Lower Extremity Assessment: Generalized weakness       Communication   Communication Communication: No apparent difficulties    Cognition Arousal: Alert Behavior During Therapy: Impulsive   PT - Cognitive impairments: No family/caregiver present to determine baseline, Orientation, Memory, Safety/Judgement, Problem solving, Awareness   Orientation impairments: Situation                   PT - Cognition Comments: unable to recall why she's in the hospital even after therapists educated her, decreased safety awareness as pt received impulsively transferring to standard chair (wonder if pt believed it to be BSC 2/2 incontinent BM), attempting to clean peri area with bare hand vs wash cloth therapist's handing to her Following commands: Impaired Following commands impaired: Follows multi-step commands with increased time, Follows multi-step commands inconsistently     Cueing Cueing Techniques: Verbal cues     General Comments General comments (skin integrity, edema, etc.): Pt on supplemental O2, SpO2 >90% but pt does endorse feeling SOB at times.    Exercises     Assessment/Plan    PT Assessment Patient needs continued PT services  PT Problem List Decreased strength;Cardiopulmonary status limiting activity;Decreased cognition;Decreased activity tolerance;Decreased balance;Decreased mobility;Decreased safety awareness;Decreased knowledge of use of DME       PT Treatment Interventions Balance training;Gait training;Therapeutic exercise;DME instruction;Stair training;Functional mobility training;Therapeutic activities;Patient/family education;Neuromuscular re-education;Cognitive remediation    PT Goals (Current goals can be found in the Care Plan  section)  Acute Rehab PT Goals Patient Stated Goal: none stated PT Goal Formulation: With patient Time For Goal Achievement: 01/12/25 Potential to Achieve Goals: Good    Frequency Min 2X/week     Co-evaluation PT/OT/SLP Co-Evaluation/Treatment: Yes Reason for Co-Treatment: For patient/therapist safety;To address functional/ADL transfers PT goals addressed during session: Balance;Mobility/safety with mobility         AM-PAC PT 6 Clicks Mobility  Outcome Measure Help needed turning from your back to your side while in a flat bed without using bedrails?: None Help needed moving from lying on your back to sitting on the side of a flat bed without using bedrails?: None Help needed moving to and from a bed to a chair (including a wheelchair)?: A Little Help needed standing up from a chair using your arms (e.g., wheelchair or bedside chair)?: A Little Help needed to walk in hospital room?: A Little Help needed climbing 3-5 steps with a railing? : A Little 6 Click Score: 20    End of Session   Activity Tolerance: Patient tolerated treatment well;Patient limited by fatigue Patient left: in chair;with call bell/phone within reach;with chair alarm set Nurse Communication: Mobility status PT Visit Diagnosis: Unsteadiness on feet (R26.81);Muscle weakness (generalized) (M62.81);Other abnormalities of gait and mobility (R26.89)    Time: 8985-8952 PT Time Calculation (min) (ACUTE ONLY): 33 min   Charges:   PT Evaluation $PT Eval Low Complexity: 1 Low   PT General Charges $$  ACUTE PT VISIT: 1 Visit         Richerd Pinal, PT, DPT 12/29/2024, 11:04 AM   Richerd CHRISTELLA Pinal 12/29/2024, 11:02 AM

## 2024-12-29 NOTE — Progress Notes (Signed)
 Lower extremity venous duplex completed. Please see CV Procedures for preliminary results.  Garnette Rockers, RVT 12/29/2024 1:39 PM

## 2024-12-29 NOTE — Evaluation (Signed)
 Clinical/Bedside Swallow Evaluation Patient Details  Name: [name] MRN: [id number] Date of Birth: 08-09-1946  Today's Date: 12/29/2024 Time: SLP Start Time (ACUTE ONLY): 0910 SLP Stop Time (ACUTE ONLY): 0925 SLP Time Calculation (min) (ACUTE ONLY): 15 min  Past Medical History:  Past Medical History:  Diagnosis Date   Allergy  09/30/1996   Arthritis    Asthma    Complication of anesthesia    COPD (chronic obstructive pulmonary disease) (HCC)    Diabetes (HCC)    High cholesterol    History of radiation therapy    Right Lung- 12/04/21-12/11/21- Dr. Lynwood Horton   Hypertension    Lung cancer [facility])    Neuromuscular disorder (HCC)    Pneumonia 09/10/2017   PONV (postoperative nausea and vomiting)    Stroke (HCC) 2007   mini strokes   Past Surgical History:  Past Surgical History:  Procedure Laterality Date   ABDOMINAL HYSTERECTOMY  1995   BRONCHIAL BIOPSY  10/23/2021   Procedure: BRONCHIAL BIOPSIES;  Surgeon: Tammy Adine CROME, DO;  Location: [facility];  Service: Pulmonary;;   BRONCHIAL BRUSHINGS  10/23/2021   Procedure: BRONCHIAL BRUSHINGS;  Surgeon: Tammy Adine CROME, DO;  Location: [facility];  Service: Pulmonary;;   BRONCHIAL NEEDLE ASPIRATION BIOPSY  10/23/2021   Procedure: BRONCHIAL NEEDLE ASPIRATION BIOPSIES;  Surgeon: Tammy Adine CROME, DO;  Location: [facility];  Service: Pulmonary;;   carpel tunnel surgery Right 2006   COLONOSCOPY  2014   COLOSTOMY  2007   colostomy let down  2008   endoscopy  2014   FIDUCIAL MARKER PLACEMENT  10/23/2021   Procedure: FIDUCIAL MARKER PLACEMENT;  Surgeon: Tammy Adine CROME, DO;  Location: [facility];  Service: Pulmonary;;   IR IMAGING GUIDED PORT INSERTION  12/03/2024   rotator cuff tear Left 04/14/2020   TUBAL LIGATION     VIDEO BRONCHOSCOPY WITH ENDOBRONCHIAL NAVIGATION Right 10/23/2021   Procedure: VIDEO BRONCHOSCOPY WITH ENDOBRONCHIAL NAVIGATION;  Surgeon: Tammy Adine CROME, DO;  Location: [facility];  Service:  Pulmonary;  Laterality: Right;  ION, w/ fiducial placement   VIDEO BRONCHOSCOPY WITH RADIAL ENDOBRONCHIAL ULTRASOUND  10/23/2021   Procedure: VIDEO BRONCHOSCOPY WITH RADIAL ENDOBRONCHIAL ULTRASOUND;  Surgeon: Tammy Adine CROME, DO;  Location: [facility];  Service: Pulmonary;;   HPI:  [name] is a 78 y.o. 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  MRI reports Right caudate nucleus nodule has mildly increased in size, concerning for metastasis; no additional intracranial lesions are identified    Assessment / Plan / Recommendation  Clinical Impression  A bedside swallow evaluation was completed to assess for s/sx of oropharyngeal dysphagia. Oral mechanism exam WFL - though top dentures unavailable; missing lower dentition. POs administered included thin liquids and solids. Patient with timely mastication and complete oral clearance. No s/sx of aspiration present. Recommend regular/thin diet with use of standardized precautions including sitting upright during PO and taking small bites/sips at a slow rate.        Aspiration Risk  Mild aspiration risk    Diet Recommendation Regular;Thin liquid    Liquid Administration via: Straw;Cup Medication Administration: Whole meds with liquid Supervision: Patient able to self feed Compensations: Slow rate;Small sips/bites Postural Changes: Seated upright at 90 degrees    Other Recommendations Oral Care Recommendations: Oral care BID  Assistance Recommended at Discharge Intermittent Supervision/Assistance  Functional Status Assessment Patient has not had a recent decline in their functional status       Swallow Study   General HPI: [name] is a 78 y.o. female with hx of stage IV lung cancer with mets to the  liver, pancreatic tail, muscles, left parotid gland and possibly the brain, COPD, diabetes, hyperlipidemia and stroke who presents with acute onset aphasia and right-sided weakness as well as headache.  MRI reports Right caudate nucleus nodule has mildly increased in size, concerning for metastasis; no additional intracranial lesions are identified Type of Study: Bedside Swallow Evaluation Previous Swallow Assessment: none Diet Prior to this Study: Regular;Thin liquids (Level 0) Respiratory Status: Nasal cannula (1L) Behavior/Cognition: Alert;Cooperative;Pleasant mood Oral Care Completed by SLP: No Oral Cavity - Dentition: Dentures, not available;Missing dentition;Dentures, top Vision: Functional for self-feeding Self-Feeding Abilities: Able to feed self Patient Positioning: Upright in bed Baseline Vocal Quality: Normal Volitional Cough: Strong Volitional Swallow: Able to elicit    Oral/Motor/Sensory Function Overall Oral Motor/Sensory Function: Within functional limits   Ice Chips Ice chips: Not tested   Thin Liquid Thin Liquid: Within functional limits Presentation: Self Fed;Straw    Nectar Thick Nectar Thick Liquid: Not tested   Honey Thick Honey Thick Liquid: Not tested   Puree Puree: Not tested   Solid     Solid: Within functional limits Presentation: Self Fed      [name] M.A., CCC-SLP 12/29/2024,9:36 AM

## 2024-12-29 NOTE — Progress Notes (Signed)
 Inpatient Rehab Admissions Coordinator:   Per therapy recommendations, patient was screened for CIR candidacy by Leita Kleine, MS, CCC-SLP. At this time, Pt. Medical necessity is unclear, as Pt. Work up is not complete. I will not pursue a rehab consult for this Pt.   At this time but I will rescreen once work up is complete. Recommend other rehab venues to be pursued.  Please contact me with any questions.  Leita Kleine, MS, CCC-SLP Rehab Admissions Coordinator  [phone] (celll) [phone] (office)

## 2024-12-29 NOTE — Progress Notes (Signed)
 PHARMACY - ANTICOAGULATION CONSULT NOTE  Pharmacy Consult for Heparin  > Eliquis Indication: pulmonary embolus  Vital Signs: Temp: 99.1 F (37.3 C) (12/31 1202) Temp Source: Oral (12/31 1202) BP: 103/54 (12/31 1202) Pulse Rate: 89 (12/31 1202)  Labs: Recent Labs    12/27/24 1639 12/28/24 1105 12/28/24 1724 12/28/24 1924 12/29/24 0441  HGB 11.2*  --   --   --  9.7*  HCT 33.9*  --   --   --  30.1*  PLT 417*  --   --   --  456*  LABPROT 13.8  --   --   --   --   INR 1.0  --   --   --   --   HEPARINUNFRC  --    < > <0.10* <0.10* 0.52  CREATININE 0.90  --   --   --  0.65   < > = values in this interval not displayed.    Estimated Creatinine Clearance: 59.7 mL/min (by C-G formula based on SCr of 0.65 mg/dL).  Assessment: 78 y.o. female admitted with possible CVA, found to have small RLL PE on chest CT. She was started on IV heparin . Initial heparin  level si therapeutic at 0.38. No bleeding or other issues noted.   12/31 AM update: HL now 0.52 (slightly above range) biut Hgb has decreased from 11.2>>9.7. Will decrease slightly  PM update: to transition to Eliquis  Goal of Therapy:  Heparin  level 0.3-0.5 units/mL Monitor platelets by anticoagulation protocol: Yes   Plan:  D/c heparin  Start eliquis 10mg  PO BID x 7 days then 5mg  PO BID thereafter Will provide education and copay check prior to discharge  Rocky Slade, PharmD, BCPS Clinical Pharmacist [place] Please utilize Amion for appropriate phone number to reach the unit pharmacist [facility])  12/29/2024 3:26 PM

## 2024-12-29 NOTE — Evaluation (Addendum)
 Occupational Therapy Evaluation Patient Details Name: [name] MRN: [id number] DOB: 01/30/1946 Today's Date: 12/29/2024   History of Present Illness   Pt is a 78 y/o F admitted on 12/27/24 after presenting with c/o word finding issues, confusion, slurred speech. Brain MRI concerning for metastasis. Pt also found to have small segmental PE in R lower lobe. PMH: metastatic lung CA, COPD, HTN,, arthritis, asthma, DM, high cholesterol, neuromuscular disorder, PNA, stroke     Clinical Impressions Pt presents with decline in function and safety with ADLs and ADL mobility with impaired strength, balance, endurance and cognition/safety awareness. Pt reports that PTA she lives at home with her husband and was Ind with ADLs, IADLs, home mgt, drives and used no AD for mobility. Upon arrival to pt's room, bed alarm sounding with pt sitting in chair with in BM stating that she needed to get to the bathroom. Pt seen for PT evaluation with pt agreeable, received impulsively transferring OOB to standard chair in room. Pt with incontinent BM and seemed to be unaware, reporting she had an accident. Pt currently requires min A with LB ADLs, CGA standing at sink for grooming/hygiene tasks, min A with toileting tasks and min A/CGA for mobility 1 person HHA. Pt required max supervision & cuing during LB bathing and peri hygiene & changing into clean gown. Pt reports fatigue during sink side tasks and at end of session. If pt d/c home vs post acute rehab, she will require 24/7 supervision for safety, especially with higher level cognitive & ADL activities. OT will follow acutely to maximize level of function and safety.       If plan is discharge home, recommend the following:   A little help with bathing/dressing/bathroom;A little help with walking and/or transfers;Assistance with cooking/housework;Direct supervision/assist for financial management;Supervision due to cognitive status;Assist for  transportation;Direct supervision/assist for medications management;Help with stairs or ramp for entrance     Functional Status Assessment   Patient has had a recent decline in their functional status and demonstrates the ability to make significant improvements in function in a reasonable and predictable amount of time.     Equipment Recommendations   Tub/shower seat     Recommendations for Other Services   Rehab consult     Precautions/Restrictions   Precautions Precautions: Fall Restrictions Weight Bearing Restrictions Per Provider Order: No     Mobility Bed Mobility               General bed mobility comments: not tested, pt received OOB & left in chair    Transfers Overall transfer level: Needs assistance Equipment used: 1 person hand held assist Transfers: Sit to/from Stand, Bed to chair/wheelchair/BSC Sit to Stand: Min assist, Contact guard assist     Step pivot transfers: Min assist            Balance Overall balance assessment: Needs assistance Sitting-balance support: Feet supported Sitting balance-Leahy Scale: Good     Standing balance support: During functional activity, No upper extremity supported Standing balance-Leahy Scale: Poor                             ADL either performed or assessed with clinical judgement   ADL Overall ADL's : Needs assistance/impaired Eating/Feeding: Independent;Sitting   Grooming: Wash/dry hands;Wash/dry face;Oral care;Contact guard assist;Standing   Upper Body Bathing: Set up;Supervision/ safety;Sitting   Lower Body Bathing: Minimal assistance;Sit to/from stand;Cueing for safety   Upper Body Dressing : Set  up;Supervision/safety;Sitting   Lower Body Dressing: Minimal assistance;Sit to/from stand;Cueing for safety   Toilet Transfer: Minimal assistance;Contact guard assist;Ambulation;Cueing for safety   Toileting- Clothing Manipulation and Hygiene: Minimal assistance;Sit to/from  stand       Functional mobility during ADLs: Minimal assistance;Contact guard assist;Cueing for safety General ADL Comments: pt required repeated cues for awareness during bathing and toileting tasks     Vision Ability to See in Adequate Light: 0 Adequate Patient Visual Report: No change from baseline       Perception         Praxis         Pertinent Vitals/Pain Pain Assessment Pain Assessment: No/denies pain     Extremity/Trunk Assessment Upper Extremity Assessment Upper Extremity Assessment: Generalized weakness;Right hand dominant   Lower Extremity Assessment Lower Extremity Assessment: Defer to PT evaluation       Communication Communication Communication: No apparent difficulties   Cognition Arousal: Alert Behavior During Therapy: Impulsive Cognition: No family/caregiver present to determine baseline                               Following commands: Impaired Following commands impaired: Follows multi-step commands with increased time, Follows multi-step commands inconsistently     Cueing  General Comments   Cueing Techniques: Verbal cues;Visual cues  Pt on supplemental O2, SpO2 >90% but pt does endorse feeling SOB at times.   Exercises     Shoulder Instructions      Home Living Family/patient expects to be discharged to:: Private residence Living Arrangements: Spouse/significant other;Children Available Help at Discharge: Family Type of Home: House Home Access: Stairs to enter Secretary/administrator of Steps: 8 Entrance Stairs-Rails: None Home Layout: Multi-level     Bathroom Shower/Tub: Walk-in shower;Tub/shower unit   Firefighter: Standard     Home Equipment: None      Lives With: Spouse    Prior Functioning/Environment Prior Level of Function : Patient poor historian/Family not available             Mobility Comments: Pt reports she's ambulates without AD ADLs Comments: Ind with ADLs, home mgt, drives,  shopping, cooking    OT Problem List: Impaired balance (sitting and/or standing);Decreased activity tolerance;Decreased strength;Decreased cognition;Decreased knowledge of use of DME or AE;Decreased safety awareness   OT Treatment/Interventions: Self-care/ADL training;Therapeutic exercise;Patient/family education;Balance training;Therapeutic activities;DME and/or AE instruction;Cognitive remediation/compensation      OT Goals(Current goals can be found in the care plan section)   Acute Rehab OT Goals Patient Stated Goal: go home OT Goal Formulation: With patient Time For Goal Achievement: 01/12/25 Potential to Achieve Goals: Good ADL Goals Pt Will Perform Grooming: with supervision;with set-up;standing Pt Will Perform Lower Body Bathing: with contact guard assist;with supervision;sit to/from stand Pt Will Perform Lower Body Dressing: with contact guard assist;with supervision;sit to/from stand Pt Will Transfer to Toilet: with contact guard assist;with supervision;ambulating Pt Will Perform Toileting - Clothing Manipulation and hygiene: with contact guard assist;with supervision;sit to/from stand Pt Will Perform Tub/Shower Transfer: with contact guard assist;with supervision;ambulating;grab bars;shower seat   OT Frequency:  Min 2X/week    Co-evaluation   Reason for Co-Treatment: For patient/therapist safety;To address functional/ADL transfers PT goals addressed during session: Balance;Mobility/safety with mobility        AM-PAC OT 6 Clicks Daily Activity     Outcome Measure Help from another person eating meals?: None Help from another person taking care of personal grooming?: A Little Help from another person  toileting, which includes using toliet, bedpan, or urinal?: A Little Help from another person bathing (including washing, rinsing, drying)?: A Little Help from another person to put on and taking off regular upper body clothing?: A Little Help from another person to  put on and taking off regular lower body clothing?: A Little 6 Click Score: 19   End of Session Nurse Communication: Mobility status  Activity Tolerance: Patient tolerated treatment well;Patient limited by fatigue Patient left: in chair;with call bell/phone within reach;with chair alarm set  OT Visit Diagnosis: Muscle weakness (generalized) (M62.81);Other abnormalities of gait and mobility (R26.89);Unsteadiness on feet (R26.81)                Time: 8985-8952 OT Time Calculation (min): 33 min Charges:  OT General Charges $OT Visit: 1 Visit OT Evaluation $OT Eval Low Complexity: 1 Low    Jacques Karna Loose 12/29/2024, 12:09 PM

## 2024-12-30 DIAGNOSIS — R4701 Aphasia: Secondary | ICD-10-CM | POA: Diagnosis not present

## 2024-12-30 LAB — CBC WITH DIFFERENTIAL/PLATELET
Abs Immature Granulocytes: 0.01 K/uL (ref 0.00–0.07)
Basophils Absolute: 0 K/uL (ref 0.0–0.1)
Basophils Relative: 1 %
Eosinophils Absolute: 0.1 K/uL (ref 0.0–0.5)
Eosinophils Relative: 2 %
HCT: 30.6 % — ABNORMAL LOW (ref 36.0–46.0)
Hemoglobin: 9.7 g/dL — ABNORMAL LOW (ref 12.0–15.0)
Immature Granulocytes: 0 %
Lymphocytes Relative: 37 %
Lymphs Abs: 1.5 K/uL (ref 0.7–4.0)
MCH: 25.5 pg — ABNORMAL LOW (ref 26.0–34.0)
MCHC: 31.7 g/dL (ref 30.0–36.0)
MCV: 80.3 fL (ref 80.0–100.0)
Monocytes Absolute: 0.5 K/uL (ref 0.1–1.0)
Monocytes Relative: 13 %
Neutro Abs: 1.9 K/uL (ref 1.7–7.7)
Neutrophils Relative %: 47 %
Platelets: 504 K/uL — ABNORMAL HIGH (ref 150–400)
RBC: 3.81 MIL/uL — ABNORMAL LOW (ref 3.87–5.11)
RDW: 19.1 % — ABNORMAL HIGH (ref 11.5–15.5)
WBC: 3.9 K/uL — ABNORMAL LOW (ref 4.0–10.5)
nRBC: 0 % (ref 0.0–0.2)

## 2024-12-30 LAB — COMPREHENSIVE METABOLIC PANEL WITH GFR
ALT: 18 U/L (ref 0–44)
AST: 49 U/L — ABNORMAL HIGH (ref 15–41)
Albumin: 3.7 g/dL (ref 3.5–5.0)
Alkaline Phosphatase: 65 U/L (ref 38–126)
Anion gap: 12 (ref 5–15)
BUN: 12 mg/dL (ref 8–23)
CO2: 24 mmol/L (ref 22–32)
Calcium: 8.9 mg/dL (ref 8.9–10.3)
Chloride: 98 mmol/L (ref 98–111)
Creatinine, Ser: 0.82 mg/dL (ref 0.44–1.00)
GFR, Estimated: >60 mL/min
Glucose, Bld: 115 mg/dL — ABNORMAL HIGH (ref 70–99)
Potassium: 3.9 mmol/L (ref 3.5–5.1)
Sodium: 134 mmol/L — ABNORMAL LOW (ref 135–145)
Total Bilirubin: 0.3 mg/dL (ref 0.0–1.2)
Total Protein: 6.9 g/dL (ref 6.5–8.1)

## 2024-12-30 LAB — PHOSPHORUS: Phosphorus: 3.8 mg/dL (ref 2.5–4.6)

## 2024-12-30 LAB — CORTISOL: Cortisol, Plasma: 22 ug/dL

## 2024-12-30 LAB — TSH: TSH: 1.92 u[IU]/mL (ref 0.350–4.500)

## 2024-12-30 LAB — MAGNESIUM: Magnesium: 2.2 mg/dL (ref 1.7–2.4)

## 2024-12-30 MED ORDER — FAMOTIDINE 20 MG PO TABS
20.0000 mg | ORAL_TABLET | Freq: Every day | ORAL | Status: DC
  Administered 2024-12-30 – 2025-01-02 (×4): 20 mg via ORAL
  Filled 2024-12-30 (×4): qty 1

## 2024-12-30 MED ORDER — PREDNISONE 20 MG PO TABS
40.0000 mg | ORAL_TABLET | Freq: Every day | ORAL | Status: AC
  Administered 2024-12-30 – 2025-01-03 (×5): 40 mg via ORAL
  Filled 2024-12-30 (×5): qty 2

## 2024-12-30 MED ORDER — PREDNISONE 20 MG PO TABS: 40.0000 mg | ORAL_TABLET | Freq: Every day | ORAL | Status: DC

## 2024-12-30 MED ORDER — BUPROPION HCL ER (XL) 150 MG PO TB24
150.0000 mg | ORAL_TABLET | Freq: Every day | ORAL | Status: DC
  Administered 2024-12-30 – 2025-01-03 (×5): 150 mg via ORAL
  Filled 2024-12-30 (×5): qty 1

## 2024-12-30 MED ORDER — MONTELUKAST SODIUM 10 MG PO TABS
10.0000 mg | ORAL_TABLET | Freq: Every day | ORAL | Status: DC
  Administered 2024-12-30 – 2025-01-02 (×4): 10 mg via ORAL
  Filled 2024-12-30 (×4): qty 1

## 2024-12-30 MED ORDER — FOLIC ACID 1 MG PO TABS
1.0000 mg | ORAL_TABLET | Freq: Every day | ORAL | Status: DC
  Administered 2024-12-30 – 2025-01-03 (×5): 1 mg via ORAL
  Filled 2024-12-30 (×5): qty 1

## 2024-12-30 NOTE — Progress Notes (Signed)
 Physical Therapy Treatment Patient Details Name: [name] MRN: [id number] DOB: Jul 23, 1946 Today's Date: 12/30/2024   History of Present Illness Pt is a 79 y/o F admitted on 12/27/24 after presenting with c/o word finding issues, confusion, slurred speech. Brain MRI concerning for metastasis. Pt also found to have small segmental PE in R lower lobe. PMH: metastatic lung CA, COPD, HTN,, arthritis, asthma, DM, high cholesterol, neuromuscular disorder, PNA, stroke    PT Comments  Pt received standing up at bedside table in room. At rest pt's speech is clear and she can convey her thoughts without difficulty. Ambulation was initiated in room without AD but pt reaching for stable surfaces and was very unsteady, needed min A. Pt was more stable with RW and used it in hallway but pt hit objects on L side with decreased awareness of this happening. When brought to her attention she attributed it to therapist standing on her R side. However, when therapist switched sides, L veer continued. Pt able to verbally acknowledge this issue but unable to correct herself without tactile stimulation. When pt was ambulating her speech became more halting and she had word finding difficulties. Pt very fatigued last 30' and needed min A even with RW. Patient will benefit from intensive inpatient follow-up therapy, >3 hours/day. PT will continue to follow.     If plan is discharge home, recommend the following: A little help with bathing/dressing/bathroom;Assistance with cooking/housework;A little help with walking and/or transfers;Assist for transportation;Help with stairs or ramp for entrance;Direct supervision/assist for medications management;Supervision due to cognitive status;Direct supervision/assist for financial management   Can travel by private vehicle        Equipment Recommendations  Rolling walker (2 wheels);BSC/3in1    Recommendations for Other Services       Precautions / Restrictions  Precautions Precautions: Fall Restrictions Weight Bearing Restrictions Per Provider Order: No     Mobility  Bed Mobility               General bed mobility comments: pt standing at tray table beside bed    Transfers Overall transfer level: Needs assistance Equipment used: None Transfers: Sit to/from Stand Sit to Stand: Contact guard assist           General transfer comment: CGA for safety    Ambulation/Gait Ambulation/Gait assistance: Contact guard assist, Min assist Gait Distance (Feet): 160 Feet Assistive device: None, Rolling walker (2 wheels) Gait Pattern/deviations: Decreased step length - right, Decreased step length - left, Decreased stride length, Staggering left Gait velocity: decreased Gait velocity interpretation: 1.31 - 2.62 ft/sec, indicative of limited community ambulator   General Gait Details: pt ambulated in room without AD but was reaching for stable surfaces and was very unsteady. Needed min A for safety. With RW pt was initially CGA but did bump objects on L with RW 3x. As she fatigued she again needed min A to steady even with RW, last 30'   Stairs             Wheelchair Mobility     Tilt Bed    Modified Rankin (Stroke Patients Only)       Balance Overall balance assessment: Needs assistance Sitting-balance support: Feet supported Sitting balance-Leahy Scale: Good     Standing balance support: During functional activity, No upper extremity supported Standing balance-Leahy Scale: Poor Standing balance comment: high fall risk with decreased attention to L side  Communication Communication Communication: Impaired (word finding difficulties as she fatigued) Factors Affecting Communication: Difficulty expressing self  Cognition Arousal: Alert Behavior During Therapy: Impulsive   PT - Cognitive impairments: No family/caregiver present to determine baseline, Orientation, Memory,  Safety/Judgement, Problem solving, Awareness   Orientation impairments: Situation                   PT - Cognition Comments: pt with decreased attention to L side and with minimal awareness of this. She first attributed hitting things on the L to therapist standing on R side. When therapist stood on L she continued to veer L with RW until therapist could no longer stand on L. She still remained larger unaware of issue Following commands: Impaired Following commands impaired: Follows multi-step commands with increased time, Follows multi-step commands inconsistently    Cueing Cueing Techniques: Verbal cues, Visual cues  Exercises      General Comments General comments (skin integrity, edema, etc.): SPO2 dropped to 84% on RA though pt was not SOB. 90% on 2L O2.      Pertinent Vitals/Pain Pain Assessment Pain Assessment: No/denies pain    Home Living                          Prior Function            PT Goals (current goals can now be found in the care plan section) Acute Rehab PT Goals Patient Stated Goal: return home PT Goal Formulation: With patient Time For Goal Achievement: 01/12/25 Potential to Achieve Goals: Good Progress towards PT goals: Progressing toward goals    Frequency    Min 2X/week      PT Plan      Co-evaluation              AM-PAC PT 6 Clicks Mobility   Outcome Measure  Help needed turning from your back to your side while in a flat bed without using bedrails?: None Help needed moving from lying on your back to sitting on the side of a flat bed without using bedrails?: None Help needed moving to and from a bed to a chair (including a wheelchair)?: A Little Help needed standing up from a chair using your arms (e.g., wheelchair or bedside chair)?: A Little Help needed to walk in hospital room?: A Lot Help needed climbing 3-5 steps with a railing? : A Lot 6 Click Score: 18    End of Session   Activity Tolerance:  Patient limited by fatigue Patient left: with call bell/phone within reach;in bed (EOB with breakfast) Nurse Communication: Mobility status PT Visit Diagnosis: Unsteadiness on feet (R26.81);Muscle weakness (generalized) (M62.81);Other abnormalities of gait and mobility (R26.89)     Time: 9161-9146 PT Time Calculation (min) (ACUTE ONLY): 15 min  Charges:    $Gait Training: 8-22 mins PT General Charges $$ ACUTE PT VISIT: 1 Visit                     Richerd Lipoma, PT  Acute Rehab Services Secure chat preferred Office [phone]    Richerd CROME Mariabelen Pressly 12/30/2024, 10:16 AM

## 2024-12-30 NOTE — Plan of Care (Signed)

## 2024-12-30 NOTE — Progress Notes (Addendum)
 " PROGRESS NOTE    [name]  FMW:[id number] DOB: 01/31/1946 DOA: 12/27/2024 PCP: Jarold Medici, MD  Chief Complaint  Patient presents with   Altered Mental Status    Brief Narrative:   [name] is [name] 79 y.o. female with medical history significant of metastatic lung cancer, COPD, HTN and multiple other medical issues here with word finding issues, confusion, and slurred speech.   MRI without stroke, but notable for metastasis.    Evaluated by neurology who suspects presentation was due to delirium.  Improving, discharge pending therapy eval.  Assessment & Plan:   Principal Problem:   Aphasia Active Problems:   Stroke-like symptoms  Lightheadedness Noted LH when walking this AM, drifting to L Follow orthostatics and progress with therapy Therapy recommending CIR   COPD Exacerbation Mild, but continued wheezing today Scheduled and prn nebs Per my discussion with therapy, dropped sats to 84% on RA with ambulation - with continued wheezing, will start short course of steroids Negative covid, flu, RSV  Stroke Like Symptoms Woke up around noon 12/29 with confusion, slurred speech, difficulty standing R sided weakness and expressive aphasia MRI with/without with right caudate nucleus nodule increased in size - no additional intracranial lesions identified CTA head/neck without acute LVO, no evidence of ischemia by CT brain perfusion.  Moderate stennosis at L vertebral artery origin.   Echo with preserved EF, grade 1 diastolic dysfunction EEG without seizures or epileptiform discharges A1c 6.3, LDL 36 PT/OT/SLP Neurology consult appreciate assistance - suspected resolving delirium.     Segmental Pulmonary Embolism Transitioned to eliquis Echo as above, normal RVSF   Suspected Rotator Cuff Issues Outpatient follow up   Metastatic Lung Cancer Follows with Dr. Sherrod, s/p first round of chemo 12/11 it looks like Sees Dr. Buckley for MRI 10/2024 with  findings concerning for possible met Follows with Dr. Shannon from rad onc   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   T2DM Noted Follow AM bg's   Hypertension Bisoprolol   Dyslipidemia Statin  Chronic Pain Lyrica  Cymbalta   Note, she has genomic data from [facility].  Suggestions regarding medications noted.  She was on 80 mg lipitor and 40 mg protonix  prior to this admission - will continue at current doses despite suggestion to reduce dose.     DVT prophylaxis: heparin  gtt Code Status: full Family Communication: husband 1/1, interested in looking into CIR Disposition:   Status is: Observation The patient remains OBS appropriate and will d/c before 2 midnights.   Consultants:  neurology  Procedures:  Echo IMPRESSIONS     1. Left ventricular ejection fraction, by estimation, is 55 to 60%. The  left ventricle has normal function. The left ventricle has no regional  wall motion abnormalities. Left ventricular diastolic parameters are  consistent with Grade I diastolic  dysfunction (impaired relaxation).   2. Right ventricular systolic function is normal. The right ventricular  size is normal.   3. The mitral valve is normal in structure. No evidence of mitral valve  regurgitation. No evidence of mitral stenosis.   4. The aortic valve is tricuspid. Aortic valve regurgitation is not  visualized. No aortic stenosis is present.   5. The inferior vena cava is normal in size with greater than 50%  respiratory variability, suggesting right atrial pressure of 3 mmHg.   LE US  Summary:  BILATERAL:  - No evidence of deep vein thrombosis seen in the lower extremities,  bilaterally.  -No evidence of popliteal cyst, bilaterally.   Antimicrobials:  Anti-infectives (From admission, onward)    None       Subjective: C/o LH with standing, drifting to L  Objective: Vitals:   12/29/24 1814 12/30/24 0032 12/30/24 0339 12/30/24 0803  BP: (!) 107/55 (!) 156/65 (!)  111/57 (!) 120/59  Pulse: 93 87 95 98  Resp: 17 18 17 18   Temp: 100 F (37.8 C) 98.8 F (37.1 C) 99.7 F (37.6 C) 98.9 F (37.2 C)  TempSrc: Oral Oral Oral   SpO2: 100% 100% 96% 100%  Weight:      Height:        Intake/Output Summary (Last 24 hours) at 12/30/2024 0937 Last data filed at 12/29/2024 1600 Gross per 24 hour  Intake 760.47 ml  Output --  Net 760.47 ml   Filed Weights   12/28/24 1632  Weight: 74.9 kg    Examination:  General: No acute distress. Cardiovascular: RRR Lungs: unlabored, mild scattered wheezing Neurological: Alert and oriented 3. Moves all extremities 4 (noted RUE weakness due to rotator cuff issue). Cranial nerves II through XII intact. Extremities: No clubbing or cyanosis. No edema.    Data Reviewed: I have personally reviewed following labs and imaging studies  CBC: Recent Labs  Lab 12/27/24 1639 12/29/24 0441 12/30/24 0447  WBC 3.6* 3.6* 3.9*  NEUTROABS 2.1 1.8 1.9  HGB 11.2* 9.7* 9.7*  HCT 33.9* 30.1* 30.6*  MCV 78.1* 79.0* 80.3  PLT 417* 456* 504*    Basic Metabolic Panel: Recent Labs  Lab 12/27/24 1639 12/29/24 0441 12/30/24 0447  NA 137 136 134*  K 4.6 3.8 3.9  CL 96* 99 98  CO2 27 25 24   GLUCOSE 107* 83 115*  BUN 15 13 12   CREATININE 0.90 0.65 0.82  CALCIUM  9.6 8.8* 8.9  MG  --  2.3 2.2  PHOS  --  3.7 3.8    GFR: Estimated Creatinine Clearance: 58.2 mL/min (by C-G formula based on SCr of 0.82 mg/dL).  Liver Function Tests: Recent Labs  Lab 12/27/24 1639 12/29/24 0441 12/30/24 0447  AST 30 36 49*  ALT 16 15 18   ALKPHOS 87 63 65  BILITOT 0.5 0.4 0.3  PROT 8.1 6.5 6.9  ALBUMIN 4.5 3.6 3.7    CBG: Recent Labs  Lab 12/27/24 1616 12/28/24 1525  GLUCAP 138* 96     Recent Results (from the past 240 hours)  Resp panel by RT-PCR (RSV, Flu Trasean Delima&B, Covid) Anterior Nasal Swab     Status: None   Collection Time: 12/27/24  4:40 PM   Specimen: Anterior Nasal Swab  Result Value Ref Range Status   SARS  Coronavirus 2 by RT PCR NEGATIVE NEGATIVE Final    Comment: (NOTE) SARS-CoV-2 target nucleic acids are NOT DETECTED.  The SARS-CoV-2 RNA is generally detectable in upper respiratory specimens during the acute phase of infection. The lowest concentration of SARS-CoV-2 viral copies this assay can detect is 138 copies/mL. Nneoma Harral negative result does not preclude SARS-Cov-2 infection and should not be used as the sole basis for treatment or other patient management decisions. Tennyson Wacha negative result may occur with  improper specimen collection/handling, submission of specimen other than nasopharyngeal swab, presence of viral mutation(s) within the areas targeted by this assay, and inadequate number of viral copies(<138 copies/mL). Reise Hietala negative result must be combined with clinical observations, patient history, and epidemiological information. The expected result is Negative.  Fact Sheet for Patients:  bloggercourse.com  Fact Sheet for Healthcare Providers:  seriousbroker.it  This test is no t yet approved or  cleared by the [place]  FDA and  has been authorized for detection and/or diagnosis of SARS-CoV-2 by FDA under an Emergency Use Authorization (EUA). This EUA will remain  in effect (meaning this test can be used) for the duration of the COVID-19 declaration under Section 564(b)(1) of the Act, 21 U.S.C.section 360bbb-3(b)(1), unless the authorization is terminated  or revoked sooner.       Influenza Rae Plotner by PCR NEGATIVE NEGATIVE Final   Influenza B by PCR NEGATIVE NEGATIVE Final    Comment: (NOTE) The Xpert Xpress SARS-CoV-2/FLU/RSV plus assay is intended as an aid in the diagnosis of influenza from Nasopharyngeal swab specimens and should not be used as Lonna Rabold sole basis for treatment. Nasal washings and aspirates are unacceptable for Xpert Xpress SARS-CoV-2/FLU/RSV testing.  Fact Sheet for  Patients: bloggercourse.com  Fact Sheet for Healthcare Providers: seriousbroker.it  This test is not yet approved or cleared by the [place]  FDA and has been authorized for detection and/or diagnosis of SARS-CoV-2 by FDA under an Emergency Use Authorization (EUA). This EUA will remain in effect (meaning this test can be used) for the duration of the COVID-19 declaration under Section 564(b)(1) of the Act, 21 U.S.C. section 360bbb-3(b)(1), unless the authorization is terminated or revoked.     Resp Syncytial Virus by PCR NEGATIVE NEGATIVE Final    Comment: (NOTE) Fact Sheet for Patients: bloggercourse.com  Fact Sheet for Healthcare Providers: seriousbroker.it  This test is not yet approved or cleared by the [place]  FDA and has been authorized for detection and/or diagnosis of SARS-CoV-2 by FDA under an Emergency Use Authorization (EUA). This EUA will remain in effect (meaning this test can be used) for the duration of the COVID-19 declaration under Section 564(b)(1) of the Act, 21 U.S.C. section 360bbb-3(b)(1), unless the authorization is terminated or revoked.  Performed at [facility], [address] [place], [place] [address]          Radiology Studies: EEG adult Result Date: 12/29/2024 Shelton Arlin KIDD, MD     12/29/2024  5:44 PM Patient Name: [name] MRN: [id number] Epilepsy Attending: Arlin KIDD Shelton Referring Physician/Provider: Michaela Aisha SQUIBB, MD Date: 12/29/2024 Duration: 22.47 mins Patient history: 79 y.o. female with Jeannetta Cerutti history of metastatic lung cancer, with Lynn Sissel single brain metastasis who presents with confusion. EEG to evaluate for seizure Level of alertness: Awake AEDs during EEG study: PGB Technical aspects: This EEG study was done with scalp electrodes positioned according to the 10-20 International system of electrode  placement. Electrical activity was reviewed with band pass filter of 1-70Hz , sensitivity of 7 uV/mm, display speed of 57mm/sec with Keily Lepp 60Hz  notched filter applied as appropriate. EEG data were recorded continuously and digitally stored.  Video monitoring was available and reviewed as appropriate. Description: The posterior dominant rhythm consists of 8Hz  activity of moderate voltage (25-35 uV) seen predominantly in posterior head regions, symmetric and reactive to eye opening and eye closing. EEG showed intermittent generalized 3 to 6 Hz theta-delta slowing. Hyperventilation and photic stimulation were not performed.   ABNORMALITY - Intermittent slow, generalized IMPRESSION: This study is suggestive of generalized cerebral dysfunction (encephalopathy). No seizures or definite epileptiform discharges were seen throughout the recording.  Priyanka O Yadav   VAS US  LOWER EXTREMITY VENOUS (DVT) Result Date: 12/29/2024  Lower Venous DVT Study Patient Name:  [name]  Date of Exam:   12/29/2024 Medical Rec #: [id number]         Accession #:  [id number] Date of Birth: [birth year]         Patient Gender: F Patient Age:   65 years Exam Location:  [facility] Procedure:      VAS US  LOWER EXTREMITY VENOUS (DVT) Referring Phys: Eathel Pajak POWELL JR --------------------------------------------------------------------------------  Indications: Pulmonary embolism.  Risk Factors: Confirmed PE Cancer Lung. Limitations: Patient positioning. Comparison Study: None. Performing Technologist: Garnette Rockers  Examination Guidelines: Neta Upadhyay complete evaluation includes B-mode imaging, spectral Doppler, color Doppler, and power Doppler as needed of all accessible portions of each vessel. Bilateral testing is considered an integral part of Aribelle Mccosh complete examination. Limited examinations for reoccurring indications may be performed as noted. The reflux portion of the exam is performed with the patient in reverse Trendelenburg.   +---------+---------------+---------+-----------+----------+-------------------+ RIGHT    CompressibilityPhasicitySpontaneityPropertiesThrombus Aging      +---------+---------------+---------+-----------+----------+-------------------+ CFV      Full           Yes      Yes                                      +---------+---------------+---------+-----------+----------+-------------------+ SFJ      Full                                                             +---------+---------------+---------+-----------+----------+-------------------+ FV Prox  Full                                                             +---------+---------------+---------+-----------+----------+-------------------+ FV Mid   Full           Yes      Yes                                      +---------+---------------+---------+-----------+----------+-------------------+ FV DistalFull                                                             +---------+---------------+---------+-----------+----------+-------------------+ PFV      Full                                                             +---------+---------------+---------+-----------+----------+-------------------+ POP      Full           Yes      Yes                                      +---------+---------------+---------+-----------+----------+-------------------+ PTV      Full  Yes                  Not well visualized +---------+---------------+---------+-----------+----------+-------------------+ PERO                             Yes                  Not well visualized +---------+---------------+---------+-----------+----------+-------------------+   +---------+---------------+---------+-----------+----------+-------------------+ LEFT     CompressibilityPhasicitySpontaneityPropertiesThrombus Aging      +---------+---------------+---------+-----------+----------+-------------------+  CFV      Full           Yes      Yes                                      +---------+---------------+---------+-----------+----------+-------------------+ SFJ      Full                                                             +---------+---------------+---------+-----------+----------+-------------------+ FV Prox  Full                                                             +---------+---------------+---------+-----------+----------+-------------------+ FV Mid   Full           Yes      Yes                                      +---------+---------------+---------+-----------+----------+-------------------+ FV DistalFull                                                             +---------+---------------+---------+-----------+----------+-------------------+ PFV      Full                                                             +---------+---------------+---------+-----------+----------+-------------------+ POP      Full           Yes      Yes                                      +---------+---------------+---------+-----------+----------+-------------------+ PTV      Full                    Yes                                      +---------+---------------+---------+-----------+----------+-------------------+  PERO                             Yes                  Not well visualized +---------+---------------+---------+-----------+----------+-------------------+     Summary: BILATERAL: - No evidence of deep vein thrombosis seen in the lower extremities, bilaterally. -No evidence of popliteal cyst, bilaterally.   *See table(s) above for measurements and observations. Electronically signed by Penne Colorado MD on 12/29/2024 at 5:02:34 PM.    Final    ECHOCARDIOGRAM COMPLETE Result Date: 12/29/2024    ECHOCARDIOGRAM REPORT   Patient Name:   [name] Date of Exam: 12/29/2024 Medical Rec #:  [id number]        Height:       65.0 in Accession  #:    [id number]       Weight:       165.1 lb Date of Birth:  [birth year]        BSA:          1.823 m Patient Age:    78 years         BP:           103/54 mmHg Patient Gender: F                HR:           85 bpm. Exam Location:  Inpatient Procedure: 2D Echo, Pediatric Echo and Color Doppler (Both Spectral and Color            Flow Doppler were utilized during procedure). Indications:    Stroke  History:        Patient has prior history of Echocardiogram examinations, most                 recent 08/07/2021. COPD; Risk Factors:Hypertension, Diabetes and                 Dyslipidemia.  Sonographer:    Sherlean Dubin Referring Phys: [id number] Kellar Westberg CALDWELL POWELL JR IMPRESSIONS  1. Left ventricular ejection fraction, by estimation, is 55 to 60%. The left ventricle has normal function. The left ventricle has no regional wall motion abnormalities. Left ventricular diastolic parameters are consistent with Grade I diastolic dysfunction (impaired relaxation).  2. Right ventricular systolic function is normal. The right ventricular size is normal.  3. The mitral valve is normal in structure. No evidence of mitral valve regurgitation. No evidence of mitral stenosis.  4. The aortic valve is tricuspid. Aortic valve regurgitation is not visualized. No aortic stenosis is present.  5. The inferior vena cava is normal in size with greater than 50% respiratory variability, suggesting right atrial pressure of 3 mmHg. FINDINGS  Left Ventricle: Left ventricular ejection fraction, by estimation, is 55 to 60%. The left ventricle has normal function. The left ventricle has no regional wall motion abnormalities. The left ventricular internal cavity size was normal in size. There is  no left ventricular hypertrophy. Left ventricular diastolic parameters are consistent with Grade I diastolic dysfunction (impaired relaxation). Right Ventricle: The right ventricular size is normal. Right ventricular systolic function is normal. Left Atrium: Left  atrial size was normal in size. Right Atrium: Right atrial size was normal in size. Pericardium: There is no evidence of pericardial effusion. Mitral Valve: The mitral valve is normal in structure. No evidence of mitral valve regurgitation. No evidence of mitral valve stenosis. Tricuspid Valve:  The tricuspid valve is normal in structure. Tricuspid valve regurgitation is trivial. No evidence of tricuspid stenosis. Aortic Valve: The aortic valve is tricuspid. Aortic valve regurgitation is not visualized. No aortic stenosis is present. Aortic valve peak gradient measures 3.0 mmHg. Pulmonic Valve: The pulmonic valve was normal in structure. Pulmonic valve regurgitation is not visualized. No evidence of pulmonic stenosis. Aorta: The aortic root is normal in size and structure. Venous: The inferior vena cava is normal in size with greater than 50% respiratory variability, suggesting right atrial pressure of 3 mmHg. IAS/Shunts: No atrial level shunt detected by color flow Doppler.  LEFT VENTRICLE PLAX 2D LVIDd:         4.00 cm   Diastology LVIDs:         2.70 cm   LV e' medial:    6.09 cm/s LV PW:         0.80 cm   LV E/e' medial:  9.3 LV IVS:        1.00 cm   LV e' lateral:   8.49 cm/s LVOT diam:     2.00 cm   LV E/e' lateral: 6.7 LV SV:         48 LV SV Index:   26 LVOT Area:     3.14 cm  RIGHT VENTRICLE             IVC RV Basal diam:  3.00 cm     IVC diam: 1.30 cm RV Mid diam:    2.40 cm RV S prime:     12.10 cm/s TAPSE (M-mode): 1.9 cm LEFT ATRIUM           Index        RIGHT ATRIUM           Index LA diam:      2.10 cm 1.15 cm/m   RA Area:     10.80 cm LA Vol (A2C): 23.2 ml 12.72 ml/m  RA Volume:   23.40 ml  12.83 ml/m LA Vol (A4C): 23.6 ml 12.94 ml/m  AORTIC VALVE AV Area (Vmax): 3.04 cm AV Vmax:        86.65 cm/s AV Peak Grad:   3.0 mmHg LVOT Vmax:      83.95 cm/s LVOT Vmean:     58.100 cm/s LVOT VTI:       0.153 m  AORTA Ao Root diam: 2.60 cm MITRAL VALVE MV Area (PHT): 4.39 cm    SHUNTS MV Decel Time: 173  msec    Systemic VTI:  0.15 m MV E velocity: 56.60 cm/s  Systemic Diam: 2.00 cm MV Hollan Philipp velocity: 80.50 cm/s MV E/Madylyn Insco ratio:  0.70 Redell Shallow MD Electronically signed by Redell Shallow MD Signature Date/Time: 12/29/2024/1:30:37 PM    Final    MR BRAIN W WO CONTRAST Result Date: 12/29/2024 EXAM: MRI BRAIN WITH AND WITHOUT CONTRAST 12/28/2024 09:18:00 PM TECHNIQUE: Multiplanar multisequence MRI of the head/brain was performed with and without the administration of intravenous contrast. CONTRAST: 7 mL of Gadobutrol . COMPARISON: MR Head 11/17/2024. CLINICAL HISTORY: Neuro deficit, acute, stroke suspected. FINDINGS: BRAIN AND VENTRICLES: Duel Conrad 3 to 4 mm nodule previously noted within the anterior body of the caudate nucleus on the right has increased to 4 to 5 mm in diameter and is concerning for Conway Fedora metastasis. There are no additional lesions within the brain demonstrated, but the study is degraded by patient motion. There is extensive diffuse cerebral white matter disease again demonstrated. No acute infarct. No acute intracranial hemorrhage. No  mass effect or midline shift. No hydrocephalus. The sella is unremarkable. Normal flow voids. ORBITS: No acute abnormality. SINUSES: No acute abnormality. BONES AND SOFT TISSUES: Normal bone marrow signal and enhancement. Nimesh Riolo circumscribed, peripherally enhancing mass is again seen along the lateral surface of the left masseter, measuring approximately 2.0 x 1.8 x 2.3 cm, unchanged from the previous study. IMPRESSION: 1. Right caudate nucleus nodule has mildly increased in size, concerning for metastasis; no additional intracranial lesions are identified, although evaluation is limited by patient motion. 2. Extensive diffuse cerebral white matter disease. 3. Circumscribed, peripherally enhancing mass along the lateral surface of the left masseter, unchanged from the prior study. The mass remains concerning for primary or metastatic neoplasm. Electronically signed by: Evalene Coho MD 12/29/2024 04:40 AM EST RP Workstation: [id number]        Scheduled Meds:  albuterol   2.5 mg Nebulization Q6H WA   apixaban  10 mg Oral BID   Followed by   NOREEN ON 01/05/2025] apixaban  5 mg Oral BID   arformoterol   15 mcg Nebulization BID   atorvastatin   80 mg Oral Daily   bisoprolol   2.5 mg Oral Daily   budesonide  (PULMICORT ) nebulizer solution  0.25 mg Nebulization BID   DULoxetine   60 mg Oral Daily   pantoprazole   40 mg Oral Daily   pregabalin   100 mg Oral TID   revefenacin   175 mcg Nebulization Daily   Continuous Infusions:     LOS: 1 day    Time spent: over 30 min     Meliton Monte, MD [facility]   To contact the attending provider between 7A-7P or the covering provider during after hours 7P-7A, please log into the web site [email or web address] and access using universal [place] password for that web site. If you do not have the password, please call the hospital operator.  12/30/2024, 9:37 AM    "

## 2024-12-30 NOTE — Progress Notes (Signed)
SATURATION QUALIFICATIONS: (This note is used to comply with regulatory documentation for home oxygen)  Patient Saturations on Room Air at Rest = 95%  Patient Saturations on Room Air while Ambulating = 98%  Patient Saturations on 0 Liters of oxygen while Ambulating = 98%  Please briefly explain why patient needs home oxygen: 

## 2024-12-31 ENCOUNTER — Other Ambulatory Visit (HOSPITAL_COMMUNITY): Payer: Self-pay

## 2024-12-31 ENCOUNTER — Telehealth (HOSPITAL_COMMUNITY): Payer: Self-pay

## 2024-12-31 DIAGNOSIS — R200 Anesthesia of skin: Secondary | ICD-10-CM

## 2024-12-31 DIAGNOSIS — E119 Type 2 diabetes mellitus without complications: Secondary | ICD-10-CM

## 2024-12-31 DIAGNOSIS — R4701 Aphasia: Secondary | ICD-10-CM | POA: Diagnosis not present

## 2024-12-31 DIAGNOSIS — Z794 Long term (current) use of insulin: Secondary | ICD-10-CM

## 2024-12-31 DIAGNOSIS — J449 Chronic obstructive pulmonary disease, unspecified: Secondary | ICD-10-CM

## 2024-12-31 DIAGNOSIS — S4991XD Unspecified injury of right shoulder and upper arm, subsequent encounter: Secondary | ICD-10-CM

## 2024-12-31 DIAGNOSIS — C3490 Malignant neoplasm of unspecified part of unspecified bronchus or lung: Secondary | ICD-10-CM

## 2024-12-31 LAB — MAGNESIUM: Magnesium: 2.2 mg/dL (ref 1.7–2.4)

## 2024-12-31 LAB — CBC WITH DIFFERENTIAL/PLATELET
Abs Immature Granulocytes: 0.03 K/uL (ref 0.00–0.07)
Basophils Absolute: 0 K/uL (ref 0.0–0.1)
Basophils Relative: 0 %
Eosinophils Absolute: 0 K/uL (ref 0.0–0.5)
Eosinophils Relative: 0 %
HCT: 26.5 % — ABNORMAL LOW (ref 36.0–46.0)
Hemoglobin: 8.7 g/dL — ABNORMAL LOW (ref 12.0–15.0)
Immature Granulocytes: 1 %
Lymphocytes Relative: 25 %
Lymphs Abs: 1 K/uL (ref 0.7–4.0)
MCH: 26.1 pg (ref 26.0–34.0)
MCHC: 32.8 g/dL (ref 30.0–36.0)
MCV: 79.6 fL — ABNORMAL LOW (ref 80.0–100.0)
Monocytes Absolute: 0.6 K/uL (ref 0.1–1.0)
Monocytes Relative: 15 %
Neutro Abs: 2.5 K/uL (ref 1.7–7.7)
Neutrophils Relative %: 59 %
Platelets: 470 K/uL — ABNORMAL HIGH (ref 150–400)
RBC: 3.33 MIL/uL — ABNORMAL LOW (ref 3.87–5.11)
RDW: 19 % — ABNORMAL HIGH (ref 11.5–15.5)
WBC: 4.2 K/uL (ref 4.0–10.5)
nRBC: 0 % (ref 0.0–0.2)

## 2024-12-31 LAB — COMPREHENSIVE METABOLIC PANEL WITH GFR
ALT: 17 U/L (ref 0–44)
AST: 36 U/L (ref 15–41)
Albumin: 3.5 g/dL (ref 3.5–5.0)
Alkaline Phosphatase: 57 U/L (ref 38–126)
Anion gap: 10 (ref 5–15)
BUN: 12 mg/dL (ref 8–23)
CO2: 26 mmol/L (ref 22–32)
Calcium: 9.2 mg/dL (ref 8.9–10.3)
Chloride: 99 mmol/L (ref 98–111)
Creatinine, Ser: 0.74 mg/dL (ref 0.44–1.00)
GFR, Estimated: >60 mL/min
Glucose, Bld: 95 mg/dL (ref 70–99)
Potassium: 3.9 mmol/L (ref 3.5–5.1)
Sodium: 135 mmol/L (ref 135–145)
Total Bilirubin: 0.2 mg/dL (ref 0.0–1.2)
Total Protein: 6.6 g/dL (ref 6.5–8.1)

## 2024-12-31 LAB — PHOSPHORUS: Phosphorus: 3.1 mg/dL (ref 2.5–4.6)

## 2024-12-31 LAB — HEMOGLOBIN AND HEMATOCRIT, BLOOD
HCT: 29 % — ABNORMAL LOW (ref 36.0–46.0)
Hemoglobin: 9.5 g/dL — ABNORMAL LOW (ref 12.0–15.0)

## 2024-12-31 NOTE — Care Management Important Message (Signed)
 Important Message  Patient Details  Name: [name] MRN: [id number] Date of Birth: 10-12-1946   Important Message Given:  Yes - Medicare IM     Claretta Deed 12/31/2024, 3:09 PM

## 2024-12-31 NOTE — PMR Pre-admission (Shared)
 PMR Admission Coordinator Pre-Admission Assessment  Patient: [name] is an 79 y.o., female MRN: [id number] DOB: 04/25/1946 Height: 5' 5 (165.1 cm) Weight: 74.9 kg  Insurance Information HMO: Yes    PPO:       PCP:       IPA:       80/20:       OTHER: Group [id number] PRIMARYBETHA Horton Medicare HMO      Policy#: [id number]      Subscriber: self CM Name: ***      Phone#: ***     Fax#: [phone] Pre-Cert#: [id number]      Employer: Retired Benefits:  Phone #: [phone]     Name: Verified on [email or web address] 12/31/24 Eff. Date: 12/30/24     Deduct: $0      Out of Pocket Max: $3950 (met $0)      Life Max: n/a CIR: $375/day for 7 days with max co-pay $2625/admission      SNF: $0 for days 1-20, $218 for days 21-100 Outpatient:      Co-Pay: $25/visit Home Health: 100%      Co-Pay: none DME: 80%     Co-Pay: 20% Providers: in network  SECONDARY:       Policy#:      Phone#:   Artist:       Phone#:   The Best Boy for patients in Inpatient Rehabilitation Facilities with attached Privacy Act Statement-Health Care Records was provided and verbally reviewed with: Patient  Emergency Contact Information Contact Information     Name Relation Home Work Mobile   [place] Daughter   [phone]   [name] [phone]  [phone]      Other Contacts   None on File     Current Medical History  Patient Admitting Diagnosis: Encephalopathy  History of Present Illness: A 79 y.o. female with PMH of COPD, Diabetes, DM, lung cancer, CVA who presented initially to [facility] on 12/27/24 and then was transported to [facility] on 12/28/24 with word finding difficulty, confusion and slurred speech.  CT head with no acute abnormality.  MRI with right caudate nucleus nodule that is mildly increased in size and concerning for metastasis, extensive diffuse cerebral white matter disease, enhancing mass along lateral surface of  left masseter concerning for primary or metastatic neoplasm.  On Eliquis for PE.  EEG suggesting generalized cortical dysfunction, encephalopathy.  Neurology suspects deficits results of resolving delirium.  Patient was also treated for COPD exacerbation, started on short course of steroids.  Has had pain with movement of right shoulder since a fall few weeks ago.  Seen by PT and OT and found to have functional deficits requiring min assist for lower body bathing and dressing, toilet transfer.  Ambulating contact-guard/min assist with rolling walker.  Per chart review lives at home with 8 steps to enter, multilevel home but can stay 1 level mostly. Family can assist PRN after discharge but husband and daughter both work during the day.    Complete NIHSS TOTAL: 0  Patient's medical record from [facility] has been reviewed by the rehabilitation admission coordinator and physician.  Past Medical History  Past Medical History:  Diagnosis Date   Allergy  09/30/1996   Arthritis    Asthma    Complication of anesthesia    COPD (chronic obstructive pulmonary disease) (HCC)    Diabetes (HCC)    High cholesterol    History of radiation therapy    Right Lung- 12/04/21-12/11/21-  Dr. Lynwood Nasuti   Hypertension    Lung cancer [facility])    Neuromuscular disorder (HCC)    Pneumonia 09/10/2017   PONV (postoperative nausea and vomiting)    Stroke [facility]) 2007   mini strokes    Has the patient had major surgery during 100 days prior to admission? Yes  Family History   family history includes Diabetes in her brother and father; Heart attack in her father; Heart murmur in her mother; Stroke in her brother; Vision loss in her brother and brother.  Current Medications Current Medications[1]  Patients Current Diet:  Diet Order             Diet regular Room service appropriate? Yes; Fluid consistency: Thin  Diet effective now                   Precautions /  Restrictions Precautions Precautions: Fall Restrictions Weight Bearing Restrictions Per Provider Order: No   Has the patient had 2 or more falls or a fall with injury in the past year? Yes  Prior Activity Level Limited Community (1-2x/wk): Went out 3-4 X a week  Prior Functional Level Self Care: Did the patient need help bathing, dressing, using the toilet or eating? Independent  Indoor Mobility: Did the patient need assistance with walking from room to room (with or without device)? Independent  Stairs: Did the patient need assistance with internal or external stairs (with or without device)? Independent  Functional Cognition: Did the patient need help planning regular tasks such as shopping or remembering to take medications? Independent  Patient Information Are you of Hispanic, Latino/a,or Spanish origin?: A. No, not of Hispanic, Latino/a, or Spanish origin What is your race?: B. Black or African American Do you need or want an interpreter to communicate with a doctor or health care staff?: 0. No  Patient's Response To:  Health Literacy and Transportation Is the patient able to respond to health literacy and transportation needs?: Yes Health Literacy - How often do you need to have someone help you when you read instructions, pamphlets, or other written material from your doctor or pharmacy?: Never In the past 12 months, has lack of transportation kept you from medical appointments or from getting medications?: No In the past 12 months, has lack of transportation kept you from meetings, work, or from getting things needed for daily living?: No  Home Assistive Devices / Equipment Home Equipment: None  Prior Device Use: Indicate devices/aids used by the patient prior to current illness, exacerbation or injury? None of the above  Current Functional Level Cognition  Arousal/Alertness: Awake/alert Overall Cognitive Status: History of cognitive impairments - at  baseline Orientation Level: Oriented X4 Attention: Focused, Sustained Focused Attention: Appears intact Sustained Attention: Appears intact Memory: Impaired (baseline) Awareness: Appears intact Problem Solving: Appears intact (functional) Safety/Judgment: Appears intact    Extremity Assessment (includes Sensation/Coordination)  Upper Extremity Assessment: Generalized weakness, Right hand dominant  Lower Extremity Assessment: Defer to PT evaluation    ADLs  Overall ADL's : Needs assistance/impaired Eating/Feeding: Independent, Sitting Grooming: Wash/dry hands, Wash/dry face, Oral care, Contact guard assist, Standing Upper Body Bathing: Set up, Supervision/ safety, Sitting Lower Body Bathing: Minimal assistance, Sit to/from stand, Cueing for safety Upper Body Dressing : Set up, Supervision/safety, Sitting Upper Body Dressing Details (indicate cue type and reason): to don back side gown Lower Body Dressing: Minimal assistance, Sit to/from stand, Cueing for safety Toilet Transfer: Contact guard assist, Ambulation, Rolling walker (2 wheels) Toilet Transfer Details (indicate cue  type and reason): simulated via functional ambulation Toileting- Clothing Manipulation and Hygiene: Minimal assistance, Sit to/from stand Functional mobility during ADLs: Contact guard assist, Rolling walker (2 wheels) General ADL Comments: ADL participation impacted by decreased activity tolerance    Mobility  Overal bed mobility: Modified Independent General bed mobility comments: pt standing at tray table beside bed    Transfers  Overall transfer level: Needs assistance Equipment used: None Transfers: Sit to/from Stand Sit to Stand: Contact guard assist Bed to/from chair/wheelchair/BSC transfer type:: Step pivot Step pivot transfers: Min assist General transfer comment: CGA for safety    Ambulation / Gait / Stairs / Wheelchair Mobility  Ambulation/Gait Ambulation/Gait assistance: Contact guard  assist, Min assist Gait Distance (Feet): 160 Feet Assistive device: None, Rolling walker (2 wheels) Gait Pattern/deviations: Decreased step length - right, Decreased step length - left, Decreased stride length, Staggering left General Gait Details: pt ambulated in room without AD but was reaching for stable surfaces and was very unsteady. Needed min A for safety. With RW pt was initially CGA but did bump objects on L with RW 3x. As she fatigued she again needed min A to steady even with RW, last 30' Gait velocity: decreased Gait velocity interpretation: 1.31 - 2.62 ft/sec, indicative of limited community ambulator    Posture / Balance Balance Overall balance assessment: Needs assistance Sitting-balance support: Feet supported Sitting balance-Leahy Scale: Good Standing balance support: Bilateral upper extremity supported Standing balance-Leahy Scale: Poor Standing balance comment: reliant on BUE support    Special considerations/life events  Special service needs ***   Previous Home Environment (from acute therapy documentation) Living Arrangements: Spouse/significant other, Children  Lives With: Spouse Available Help at Discharge: Family Type of Home: House Home Layout: Multi-level Home Access: Stairs to enter Entrance Stairs-Rails: None Secretary/administrator of Steps: 8 Bathroom Shower/Tub: Psychologist, counselling, Engineer, Manufacturing Systems: Standard Home Care Services: No  Discharge Living Setting Plans for Discharge Living Setting: Patient's home, House, Lives with (comment) (Lives with husband, husband works) Type of Home at Discharge: [employer] Discharge Home Layout: Multi-level (Split level) Alternate Level Stairs-Number of Steps: 8 up and 8 down Discharge Home Access: Stairs to enter Entrance Stairs-Number of Steps: 2 Discharge Bathroom Shower/Tub: Tub/shower unit, Curtain Discharge Bathroom Toilet: Standard Discharge Bathroom Accessibility: Yes How Accessible: Accessible  via walker Does the patient have any problems obtaining your medications?: No  Social/Family/Support Systems Patient Roles: Spouse, Parent Contact Information: Eleanor Lamer - daughter - Anticipated Caregiver: Robena Ewy - husband Anticipated Caregiver's Contact Information: [place] - husband - [phone] Ability/Limitations of Caregiver: Husband works and daughter Caregiver Availability: Intermittent Discharge Plan Discussed with Primary Caregiver: Yes Is Caregiver In Agreement with Plan?: Yes Does Caregiver/Family have Issues with Lodging/Transportation while Pt is in Rehab?: No  Goals Patient/Family Goal for Rehab: PT/OT/SLP mod I goals Expected length of stay: 5-7 days Pt/Family Agrees to Admission and willing to participate: Yes Program Orientation Provided & Reviewed with Pt/Caregiver Including Roles  & Responsibilities: Yes  Decrease burden of Care through IP rehab admission: N/A  Possible need for SNF placement upon discharge: Not planned  Patient Condition: I have reviewed medical records from [facility], spoken with CM, and patient and spouse. I met with patient at the bedside for inpatient rehabilitation assessment.  Patient Horton benefit from ongoing PT, OT, and SLP, can actively participate in 3 hours of therapy a day 5 days of the week, and can make measurable gains during the admission.  Patient Horton also benefit from the  coordinated team approach during an Inpatient Acute Rehabilitation admission.  The patient Horton receive intensive therapy as well as Rehabilitation physician, nursing, social worker, and care management interventions.  Due to bladder management, bowel management, safety, skin/wound care, disease management, medication administration, pain management, and patient education the patient requires 24 hour a day rehabilitation nursing.  The patient is currently *** with mobility and basic ADLs.  Discharge setting and therapy post discharge at home  with home health is anticipated.  Patient has agreed to participate in the Acute Inpatient Rehabilitation Program and Horton admit {Time; today/tomorrow:10263}.  Preadmission Screen Completed By:  Lovett CHRISTELLA Ropes, 12/31/2024 4:44 PM ______________________________________________________________________   Discussed status with Dr. PIERRETTE on *** at *** and received approval for admission today.  Admission Coordinator:  Lovett CHRISTELLA Ropes, RN, time PIERRETTEPattricia ***   Assessment/Plan: Diagnosis: *** Does the need for close, 24 hr/day Medical supervision in concert with the patient's rehab needs make it unreasonable for this patient to be served in a less intensive setting? {yes_no_potentially:3041433} Co-Morbidities requiring supervision/potential complications: *** Due to {due un:6958565}, does the patient require 24 hr/day rehab nursing? {yes_no_potentially:3041433} Does the patient require coordinated care of a physician, rehab nurse, PT, OT, and SLP to address physical and functional deficits in the context of the above medical diagnosis(es)? {yes_no_potentially:3041433} Addressing deficits in the following areas: {deficits:3041436} Can the patient actively participate in an intensive therapy program of at least 3 hrs of therapy 5 days a week? {yes_no_potentially:3041433} The potential for patient to make measurable gains while on inpatient rehab is {potential:3041437} Anticipated functional outcomes upon discharge from inpatient rehab: {functional outcomes:304600100} PT, {functional outcomes:304600100} OT, {functional outcomes:304600100} SLP Estimated rehab length of stay to reach the above functional goals is: *** Anticipated discharge destination: {anticipated dc setting:21604} 10. Overall Rehab/Functional Prognosis: {potential:3041437}   MD Signature: ***     [1]  Current Facility-Administered Medications:    acetaminophen  (TYLENOL ) tablet 650 mg, 650 mg, Oral, Q4H PRN **OR** acetaminophen   (TYLENOL ) 160 MG/5ML solution 650 mg, 650 mg, Per Tube, Q4H PRN **OR** acetaminophen  (TYLENOL ) suppository 650 mg, 650 mg, Rectal, Q4H PRN, Perri DELENA Meliton Mickey., MD   albuterol  (PROVENTIL ) (2.5 MG/3ML) 0.083% nebulizer solution 2.5 mg, 2.5 mg, Nebulization, Q2H PRN, Perri DELENA Meliton Mickey., MD   albuterol  (PROVENTIL ) (2.5 MG/3ML) 0.083% nebulizer solution 2.5 mg, 2.5 mg, Nebulization, Q6H WA, Perri DELENA Meliton Mickey., MD, 2.5 mg at 12/31/24 0851   apixaban (ELIQUIS) tablet 10 mg, 10 mg, Oral, BID, 10 mg at 12/31/24 0848 **FOLLOWED BY** [START ON 01/05/2025] apixaban (ELIQUIS) tablet 5 mg, 5 mg, Oral, BID, Billy Rocky SAUNDERS, RPH   arformoterol  (BROVANA ) nebulizer solution 15 mcg, 15 mcg, Nebulization, BID, Perri DELENA Meliton Mickey., MD, 15 mcg at 12/31/24 9148   atorvastatin  (LIPITOR) tablet 80 mg, 80 mg, Oral, Daily, Perri DELENA Meliton Mickey., MD, 80 mg at 12/31/24 9152   budesonide  (PULMICORT ) nebulizer solution 0.25 mg, 0.25 mg, Nebulization, BID, Perri DELENA Meliton Mickey., MD, 0.25 mg at 12/31/24 9148   buPROPion (WELLBUTRIN XL) 24 hr tablet 150 mg, 150 mg, Oral, Daily, Perri DELENA Meliton Mickey., MD, 150 mg at 12/31/24 9153   DULoxetine  (CYMBALTA ) DR capsule 60 mg, 60 mg, Oral, Daily, Perri DELENA Meliton Mickey., MD, 60 mg at 12/31/24 0847   famotidine  (PEPCID ) tablet 20 mg, 20 mg, Oral, QPC supper, Perri DELENA Meliton Mickey., MD, 20 mg at 12/30/24 1705   folic acid  (FOLVITE ) tablet 1 mg, 1 mg, Oral, Daily, Perri DELENA Meliton Mickey., MD, 1 mg at  12/31/24 0852   montelukast  (SINGULAIR ) tablet 10 mg, 10 mg, Oral, QHS, Perri DELENA Meliton Mickey., MD, 10 mg at 12/30/24 2207   pantoprazole  (PROTONIX ) EC tablet 40 mg, 40 mg, Oral, Daily, Perri DELENA Meliton Mickey., MD, 40 mg at 12/31/24 0847   predniSONE  (DELTASONE ) tablet 40 mg, 40 mg, Oral, Q breakfast, Perri DELENA Meliton Mickey., MD, 40 mg at 12/31/24 9152   pregabalin  (LYRICA ) capsule 100 mg, 100 mg, Oral, TID, Perri DELENA Meliton Mickey., MD, 100 mg at 12/31/24 9153   revefenacin  (YUPELRI )  nebulizer solution 175 mcg, 175 mcg, Nebulization, Daily, Perri DELENA Meliton Mickey., MD, 175 mcg at 12/31/24 0851   senna-docusate (Senokot-S) tablet 1 tablet, 1 tablet, Oral, QHS PRN, Perri DELENA Meliton Mickey., MD

## 2024-12-31 NOTE — Progress Notes (Addendum)
 " PROGRESS NOTE    NUR RABOLD  FMW:[id number] DOB: 1946/10/07 DOA: 12/27/2024 PCP: Jarold Medici, MD  Chief Complaint  Patient presents with   Altered Mental Status    Brief Narrative:   [name] is Kleigh Hoelzer 79 y.o. female with medical history significant of metastatic lung cancer, COPD, HTN and multiple other medical issues here with word finding issues, confusion, and slurred speech.   MRI without stroke, but notable for metastasis.    Evaluated by neurology who suspects presentation was due to delirium.  Improving, discharge pending therapy eval.  Assessment & Plan:   Principal Problem:   Aphasia Active Problems:   Stroke-like symptoms  Lightheadedness Noted LH when walking this AM, drifting to L Follow orthostatics and progress with therapy Compression stockings.  Will hold bisoprolol  for now. Therapy recommending CIR   COPD Exacerbation Mild, but continued wheezing today Scheduled and prn nebs Per my discussion with therapy, dropped sats to 84% on RA with ambulation - with continued wheezing, will start short course of steroids Negative covid, flu, RSV  Stroke Like Symptoms Woke up around noon 12/29 with confusion, slurred speech, difficulty standing R sided weakness and expressive aphasia MRI with/without with right caudate nucleus nodule increased in size - no additional intracranial lesions identified CTA head/neck without acute LVO, no evidence of ischemia by CT brain perfusion.  Moderate stennosis at L vertebral artery origin.   Echo with preserved EF, grade 1 diastolic dysfunction EEG without seizures or epileptiform discharges A1c 6.3, LDL 36 PT/OT/SLP Neurology consult appreciate assistance - suspected resolving delirium.     Segmental Pulmonary Embolism Transitioned to eliquis Echo as above, normal RVSF   Suspected Rotator Cuff Issues Outpatient follow up   Metastatic Lung Cancer Follows with Dr. Sherrod, s/p first round of chemo 12/11 it  looks like Sees Dr. Buckley for MRI 10/2024 with findings concerning for possible met Follows with Dr. Shannon from rad onc   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   T2DM Noted Follow AM bg's   Hypertension Bisoprolol  - hold with orthostatics above  Dyslipidemia Statin  Chronic Pain Lyrica  Cymbalta   Note, she has genomic data from [facility].  Suggestions regarding medications noted.  She was on 80 mg lipitor and 40 mg protonix  prior to this admission - will continue at current doses despite suggestion to reduce dose.     DVT prophylaxis: heparin  gtt Code Status: full Family Communication: husband 1/1, interested in looking into CIR.  Updated daughter, 1/2.  Disposition:   Status is: Observation The patient remains OBS appropriate and will d/c before 2 midnights.   Consultants:  neurology  Procedures:  Echo IMPRESSIONS     1. Left ventricular ejection fraction, by estimation, is 55 to 60%. The  left ventricle has normal function. The left ventricle has no regional  wall motion abnormalities. Left ventricular diastolic parameters are  consistent with Grade I diastolic  dysfunction (impaired relaxation).   2. Right ventricular systolic function is normal. The right ventricular  size is normal.   3. The mitral valve is normal in structure. No evidence of mitral valve  regurgitation. No evidence of mitral stenosis.   4. The aortic valve is tricuspid. Aortic valve regurgitation is not  visualized. No aortic stenosis is present.   5. The inferior vena cava is normal in size with greater than 50%  respiratory variability, suggesting right atrial pressure of 3 mmHg.   LE US  Summary:  BILATERAL:  - No evidence of deep vein  thrombosis seen in the lower extremities,  bilaterally.  -No evidence of popliteal cyst, bilaterally.   Antimicrobials:  Anti-infectives (From admission, onward)    None       Subjective: Feels best today that she has while  here  Objective: Vitals:   12/30/24 2005 12/31/24 0010 12/31/24 0754 12/31/24 1244  BP: 112/62 112/75 123/83 (!) 124/97  Pulse: 92 89 91 92  Resp: 17 18    Temp: 98.4 F (36.9 C) 98.1 F (36.7 C) 98.6 F (37 C) 97.7 F (36.5 C)  TempSrc: Oral  Oral Oral  SpO2: 100% 98% 94% 94%  Weight:      Height:       No intake or output data in the 24 hours ending 12/31/24 1503  Filed Weights   12/28/24 1632  Weight: 74.9 kg    Examination:  General: No acute distress. Cardiovascular: RRR Lungs: unlabored Neurological: Alert and oriented 3. Moves all extremities 4 with equal strength. Cranial nerves II through XII grossly intact. Extremities: No clubbing or cyanosis. No edema.    Data Reviewed: I have personally reviewed following labs and imaging studies  CBC: Recent Labs  Lab 12/27/24 1639 12/29/24 0441 12/30/24 0447 12/31/24 0128  WBC 3.6* 3.6* 3.9* 4.2  NEUTROABS 2.1 1.8 1.9 2.5  HGB 11.2* 9.7* 9.7* 8.7*  HCT 33.9* 30.1* 30.6* 26.5*  MCV 78.1* 79.0* 80.3 79.6*  PLT 417* 456* 504* 470*    Basic Metabolic Panel: Recent Labs  Lab 12/27/24 1639 12/29/24 0441 12/30/24 0447 12/31/24 0128  NA 137 136 134* 135  K 4.6 3.8 3.9 3.9  CL 96* 99 98 99  CO2 27 25 24 26   GLUCOSE 107* 83 115* 95  BUN 15 13 12 12   CREATININE 0.90 0.65 0.82 0.74  CALCIUM  9.6 8.8* 8.9 9.2  MG  --  2.3 2.2 2.2  PHOS  --  3.7 3.8 3.1    GFR: Estimated Creatinine Clearance: 59.7 mL/min (by C-G formula based on SCr of 0.74 mg/dL).  Liver Function Tests: Recent Labs  Lab 12/27/24 1639 12/29/24 0441 12/30/24 0447 12/31/24 0128  AST 30 36 49* 36  ALT 16 15 18 17   ALKPHOS 87 63 65 57  BILITOT 0.5 0.4 0.3 0.2  PROT 8.1 6.5 6.9 6.6  ALBUMIN 4.5 3.6 3.7 3.5    CBG: Recent Labs  Lab 12/27/24 1616 12/28/24 1525  GLUCAP 138* 96     Recent Results (from the past 240 hours)  Resp panel by RT-PCR (RSV, Flu Zachary Lovins&B, Covid) Anterior Nasal Swab     Status: None   Collection Time:  12/27/24  4:40 PM   Specimen: Anterior Nasal Swab  Result Value Ref Range Status   SARS Coronavirus 2 by RT PCR NEGATIVE NEGATIVE Final    Comment: (NOTE) SARS-CoV-2 target nucleic acids are NOT DETECTED.  The SARS-CoV-2 RNA is generally detectable in upper respiratory specimens during the acute phase of infection. The lowest concentration of SARS-CoV-2 viral copies this assay can detect is 138 copies/mL. Velvie Thomaston negative result does not preclude SARS-Cov-2 infection and should not be used as the sole basis for treatment or other patient management decisions. Jerryl Holzhauer negative result may occur with  improper specimen collection/handling, submission of specimen other than nasopharyngeal swab, presence of viral mutation(s) within the areas targeted by this assay, and inadequate number of viral copies(<138 copies/mL). Raymie Giammarco negative result must be combined with clinical observations, patient history, and epidemiological information. The expected result is Negative.  Fact Sheet for Patients:  bloggercourse.com  Fact Sheet for Healthcare Providers:  seriousbroker.it  This test is no t yet approved or cleared by the [place]  FDA and  has been authorized for detection and/or diagnosis of SARS-CoV-2 by FDA under an Emergency Use Authorization (EUA). This EUA will remain  in effect (meaning this test can be used) for the duration of the COVID-19 declaration under Section 564(b)(1) of the Act, 21 U.S.C.section 360bbb-3(b)(1), unless the authorization is terminated  or revoked sooner.       Influenza Marquel Pottenger by PCR NEGATIVE NEGATIVE Final   Influenza B by PCR NEGATIVE NEGATIVE Final    Comment: (NOTE) The Xpert Xpress SARS-CoV-2/FLU/RSV plus assay is intended as an aid in the diagnosis of influenza from Nasopharyngeal swab specimens and should not be used as Shalandra Leu sole basis for treatment. Nasal washings and aspirates are unacceptable for Xpert Xpress  SARS-CoV-2/FLU/RSV testing.  Fact Sheet for Patients: bloggercourse.com  Fact Sheet for Healthcare Providers: seriousbroker.it  This test is not yet approved or cleared by the [place]  FDA and has been authorized for detection and/or diagnosis of SARS-CoV-2 by FDA under an Emergency Use Authorization (EUA). This EUA will remain in effect (meaning this test can be used) for the duration of the COVID-19 declaration under Section 564(b)(1) of the Act, 21 U.S.C. section 360bbb-3(b)(1), unless the authorization is terminated or revoked.     Resp Syncytial Virus by PCR NEGATIVE NEGATIVE Final    Comment: (NOTE) Fact Sheet for Patients: bloggercourse.com  Fact Sheet for Healthcare Providers: seriousbroker.it  This test is not yet approved or cleared by the [place]  FDA and has been authorized for detection and/or diagnosis of SARS-CoV-2 by FDA under an Emergency Use Authorization (EUA). This EUA will remain in effect (meaning this test can be used) for the duration of the COVID-19 declaration under Section 564(b)(1) of the Act, 21 U.S.C. section 360bbb-3(b)(1), unless the authorization is terminated or revoked.  Performed at [facility], [address] [place], [place] [address]          Radiology Studies: EEG adult Result Date: 12/29/2024 Shelton Arlin KIDD, MD     12/29/2024  5:44 PM Patient Name: [name] MRN: [id number] Epilepsy Attending: Arlin KIDD Shelton Referring Physician/Provider: Michaela Aisha SQUIBB, MD Date: 12/29/2024 Duration: 22.47 mins Patient history: 79 y.o. female with Arisbel Maione history of metastatic lung cancer, with Lesia Monica single brain metastasis who presents with confusion. EEG to evaluate for seizure Level of alertness: Awake AEDs during EEG study: PGB Technical aspects: This EEG study was done with scalp electrodes positioned according to  the 10-20 International system of electrode placement. Electrical activity was reviewed with band pass filter of 1-70Hz , sensitivity of 7 uV/mm, display speed of 75mm/sec with Daenerys Buttram 60Hz  notched filter applied as appropriate. EEG data were recorded continuously and digitally stored.  Video monitoring was available and reviewed as appropriate. Description: The posterior dominant rhythm consists of 8Hz  activity of moderate voltage (25-35 uV) seen predominantly in posterior head regions, symmetric and reactive to eye opening and eye closing. EEG showed intermittent generalized 3 to 6 Hz theta-delta slowing. Hyperventilation and photic stimulation were not performed.   ABNORMALITY - Intermittent slow, generalized IMPRESSION: This study is suggestive of generalized cerebral dysfunction (encephalopathy). No seizures or definite epileptiform discharges were seen throughout the recording.  Priyanka O Yadav        Scheduled Meds:  albuterol   2.5 mg Nebulization Q6H WA   apixaban  10 mg Oral BID   Followed  by   NOREEN ON 01/05/2025] apixaban  5 mg Oral BID   arformoterol   15 mcg Nebulization BID   atorvastatin   80 mg Oral Daily   bisoprolol   2.5 mg Oral Daily   budesonide  (PULMICORT ) nebulizer solution  0.25 mg Nebulization BID   buPROPion  150 mg Oral Daily   DULoxetine   60 mg Oral Daily   famotidine   20 mg Oral QPC supper   folic acid   1 mg Oral Daily   montelukast   10 mg Oral QHS   pantoprazole   40 mg Oral Daily   predniSONE   40 mg Oral Q breakfast   pregabalin   100 mg Oral TID   revefenacin   175 mcg Nebulization Daily   Continuous Infusions:     LOS: 2 days    Time spent: over 30 min     Meliton Monte, MD [facility]   To contact the attending provider between 7A-7P or the covering provider during after hours 7P-7A, please log into the web site [email or web address] and access using universal [place] password for that web site. If you do not have the password, please call the  hospital operator.  12/31/2024, 3:03 PM    "

## 2024-12-31 NOTE — Progress Notes (Signed)
 IP rehab admissions - I met with patient and her husband at the bedside.  They would like CIR stay.  Patient stays alone while husband and dtr work.  Will open the case with insurance and request CIR admission.  Will update all once I hear back from insurance carrier.  [phone]

## 2024-12-31 NOTE — Consult Note (Addendum)
 "     Physical Medicine and Rehabilitation Consult Reason for Consult: Rehab Referring Physician: Dr. Perri   HPI: [name] is a 79 y.o. female with PMH of COPD, Diabetes, DM, lung cancer, CVA who presented to the hospital with word finding difficulty, confusion and slurred speech.  CT head with no acute abnormality.  MRI with right caudate nucleus nodule that is mildly increased in size and concerning for metastasis, extensive diffuse cerebral white matter disease, enhancing mass along lateral surface of left masseter concerning for primary or metastatic neoplasm.  On Eliquis for PE.  EEG suggesting generalized cortical dysfunction, encephalopathy.  Neurology suspects deficits results of resolving delirium.  Patient was also treated for COPD exacerbation, started on short course of steroids.  Has had pain with movement of right shoulder since a fall few weeks ago.  Seen by PT and OT and found to have functional deficits requiring min assist for lower body bathing and dressing, toilet transfer.  Ambulating contact-guard/min assist with rolling walker.  Per chart review lives at home with 8 steps to enter, multilevel home but can stay 1 level mostly. Family can assist PRN after discharge but husband and daughter both work during the day.    Home: Home Living Family/patient expects to be discharged to:: Private residence Living Arrangements: Spouse/significant other, Children Available Help at Discharge: Family Type of Home: House Home Access: Stairs to enter Secretary/administrator of Steps: 8 Entrance Stairs-Rails: None Home Layout: Multi-level Bathroom Shower/Tub: Psychologist, counselling, Engineer, Manufacturing Systems: Standard Home Equipment: None  Lives With: Spouse  Functional History: Prior Function Prior Level of Function : Patient poor historian/Family not available Mobility Comments: Pt reports she's ambulates without AD ADLs Comments: Ind with ADLs, home mgt, drives,  shopping, cooking Functional Status:  Mobility: Bed Mobility General bed mobility comments: pt standing at tray table beside bed Transfers Overall transfer level: Needs assistance Equipment used: None Transfers: Sit to/from Stand Sit to Stand: Contact guard assist Bed to/from chair/wheelchair/BSC transfer type:: Step pivot Step pivot transfers: Min assist General transfer comment: CGA for safety Ambulation/Gait Ambulation/Gait assistance: Contact guard assist, Min assist Gait Distance (Feet): 160 Feet Assistive device: None, Rolling walker (2 wheels) Gait Pattern/deviations: Decreased step length - right, Decreased step length - left, Decreased stride length, Staggering left General Gait Details: pt ambulated in room without AD but was reaching for stable surfaces and was very unsteady. Needed min A for safety. With RW pt was initially CGA but did bump objects on L with RW 3x. As she fatigued she again needed min A to steady even with RW, last 30' Gait velocity: decreased Gait velocity interpretation: 1.31 - 2.62 ft/sec, indicative of limited community ambulator    ADL: ADL Overall ADL's : Needs assistance/impaired Eating/Feeding: Independent, Sitting Grooming: Wash/dry hands, Wash/dry face, Oral care, Contact guard assist, Standing Upper Body Bathing: Set up, Supervision/ safety, Sitting Lower Body Bathing: Minimal assistance, Sit to/from stand, Cueing for safety Upper Body Dressing : Set up, Supervision/safety, Sitting Lower Body Dressing: Minimal assistance, Sit to/from stand, Cueing for safety Toilet Transfer: Minimal assistance, Contact guard assist, Ambulation, Cueing for safety Toileting- Clothing Manipulation and Hygiene: Minimal assistance, Sit to/from stand Functional mobility during ADLs: Minimal assistance, Contact guard assist, Cueing for safety General ADL Comments: pt required repeated cues for awareness during bathing and toileting  tasks  Cognition: Cognition Overall Cognitive Status: History of cognitive impairments - at baseline Arousal/Alertness: Awake/alert Orientation Level: Oriented X4 Year: 2025 Month: December Day of Week: Correct  Attention: Focused, Sustained Focused Attention: Appears intact Sustained Attention: Appears intact Memory: Impaired (baseline) Awareness: Appears intact Problem Solving: Appears intact (functional) Safety/Judgment: Appears intact Cognition Arousal: Alert Behavior During Therapy: Impulsive Overall Cognitive Status: History of cognitive impairments - at baseline   Review of Systems  Constitutional:  Negative for chills and fever.  HENT:  Negative for hearing loss.   Eyes:  Negative for blurred vision (cataracts) and double vision.  Respiratory:  Negative for cough and shortness of breath.   Cardiovascular:  Negative for chest pain.  Gastrointestinal:  Negative for abdominal pain, diarrhea, nausea and vomiting.  Genitourinary: Negative.   Musculoskeletal:  Positive for joint pain (R shoulder-chronic). Negative for back pain and neck pain.  Neurological:  Negative for sensory change (chronic R thigh, top of head numbness) and focal weakness.  Psychiatric/Behavioral:  Negative for depression.    Past Medical History:  Diagnosis Date   Allergy  09/30/1996   Arthritis    Asthma    Complication of anesthesia    COPD (chronic obstructive pulmonary disease) (HCC)    Diabetes (HCC)    High cholesterol    History of radiation therapy    Right Lung- 12/04/21-12/11/21- Dr. Lynwood Nasuti   Hypertension    Lung cancer [facility])    Neuromuscular disorder (HCC)    Pneumonia 09/10/2017   PONV (postoperative nausea and vomiting)    Stroke (HCC) 2007   mini strokes   Past Surgical History:  Procedure Laterality Date   ABDOMINAL HYSTERECTOMY  1995   BRONCHIAL BIOPSY  10/23/2021   Procedure: BRONCHIAL BIOPSIES;  Surgeon: Brenna Adine CROME, DO;  Location: [facility];  Service:  Pulmonary;;   BRONCHIAL BRUSHINGS  10/23/2021   Procedure: BRONCHIAL BRUSHINGS;  Surgeon: Brenna Adine CROME, DO;  Location: [facility];  Service: Pulmonary;;   BRONCHIAL NEEDLE ASPIRATION BIOPSY  10/23/2021   Procedure: BRONCHIAL NEEDLE ASPIRATION BIOPSIES;  Surgeon: Brenna Adine CROME, DO;  Location: [facility];  Service: Pulmonary;;   carpel tunnel surgery Right 2006   COLONOSCOPY  2014   COLOSTOMY  2007   colostomy let down  2008   endoscopy  2014   FIDUCIAL MARKER PLACEMENT  10/23/2021   Procedure: FIDUCIAL MARKER PLACEMENT;  Surgeon: Brenna Adine CROME, DO;  Location: [facility];  Service: Pulmonary;;   IR IMAGING GUIDED PORT INSERTION  12/03/2024   rotator cuff tear Left 04/14/2020   TUBAL LIGATION     VIDEO BRONCHOSCOPY WITH ENDOBRONCHIAL NAVIGATION Right 10/23/2021   Procedure: VIDEO BRONCHOSCOPY WITH ENDOBRONCHIAL NAVIGATION;  Surgeon: Brenna Adine CROME, DO;  Location: [facility];  Service: Pulmonary;  Laterality: Right;  ION, w/ fiducial placement   VIDEO BRONCHOSCOPY WITH RADIAL ENDOBRONCHIAL ULTRASOUND  10/23/2021   Procedure: VIDEO BRONCHOSCOPY WITH RADIAL ENDOBRONCHIAL ULTRASOUND;  Surgeon: Brenna Adine CROME, DO;  Location: [facility];  Service: Pulmonary;;   Family History  Problem Relation Age of Onset   Heart murmur Mother    Heart attack Father    Diabetes Father    Diabetes Brother    Stroke Brother    Vision loss Brother    Vision loss Brother    Social History:  reports that she quit smoking about 18 years ago. Her smoking use included cigarettes. She started smoking about 57 years ago. She has a 21 pack-year smoking history. She has never used smokeless tobacco. She reports that she does not currently use alcohol. She reports that she does not use drugs. Allergies: Allergies[1] Medications Prior to Admission  Medication Sig Dispense Refill  albuterol  (VENTOLIN  HFA) 108 (90 Base) MCG/ACT inhaler Inhale 2 puffs into the lungs every 4 (four) hours as needed for  wheezing or shortness of breath. 18 g 12   arformoterol  (BROVANA ) 15 MCG/2ML NEBU Take 2 mLs (15 mcg total) by nebulization 2 (two) times daily. 120 mL 12   aspirin  81 MG tablet Take 81 mg by mouth daily.     atorvastatin  (LIPITOR) 80 MG tablet TAKE 1 TABLET BY MOUTH EVERY DAY MONDAY THROUGH FRIDAY 90 tablet 1   Biotin 89999 MCG TBDP Take 10,000 mcg by mouth daily.     bisoprolol  (ZEBETA ) 5 MG tablet TAKE 1/2 TABLET BY MOUTH EVERY DAY 45 tablet 3   budesonide  (PULMICORT ) 0.5 MG/2ML nebulizer solution Take 2 mLs (0.5 mg total) by nebulization 2 (two) times daily. 120 mL 12   buPROPion (WELLBUTRIN XL) 150 MG 24 hr tablet Take 150 mg by mouth daily.     Calcium  Carbonate (CALCIUM  600 PO) Take 600 mg by mouth daily.     CELEBREX 200 MG capsule Take 200 mg by mouth daily.     cholecalciferol (VITAMIN D3) 25 MCG (1000 UNIT) tablet Take 1,000 Units by mouth daily.     DULoxetine  (CYMBALTA ) 60 MG capsule TAKE 1 CAPSULE(60 MG) BY MOUTH DAILY 90 capsule 1   famotidine  (PEPCID ) 20 MG tablet Take 1 tablet (20 mg total) by mouth daily after supper. (Patient taking differently: Take 20 mg by mouth daily.) 90 tablet 3   folic acid  (FOLVITE ) 1 MG tablet Take 1 tablet (1 mg total) by mouth daily. Start 7 days before pemetrexed  chemotherapy. Continue until 21 days after pemetrexed  completed. 100 tablet 3   magnesium  gluconate (MAGONATE) 500 (27 Mg) MG TABS tablet Take 500 mg by mouth daily.     montelukast  (SINGULAIR ) 10 MG tablet TAKE 1 TABLET(10 MG) BY MOUTH AT BEDTIME 90 tablet 3   ondansetron  (ZOFRAN ) 8 MG tablet Take 1 tablet (8 mg total) by mouth every 8 (eight) hours as needed for nausea or vomiting. Start on the third day after carboplatin . 30 tablet 1   oxyCODONE -acetaminophen  (PERCOCET) 10-325 MG tablet Take 1 tablet by mouth every 6 (six) hours as needed for pain. 30 tablet 0   pantoprazole  (PROTONIX ) 40 MG tablet TAKE 1 TABLET BY MOUTH 30 MINUTES BEFORE FIRST MEAL OF THE DAY 90 tablet 2    prochlorperazine  (COMPAZINE ) 10 MG tablet Take 1 tablet (10 mg total) by mouth every 6 (six) hours as needed for nausea or vomiting. 30 tablet 1   REPATHA  SURECLICK 140 MG/ML SOAJ ADMINISTER 1 ML UNDER THE SKIN EVERY 14 DAYS 6 mL 3   Semaglutide , 1 MG/DOSE, (OZEMPIC , 1 MG/DOSE,) 4 MG/3ML SOPN Inject 1 mg into the skin once a week. Friday 3 mL 3   benzonatate  (TESSALON ) 200 MG capsule Take 1 capsule (200 mg total) by mouth 3 (three) times daily. (Patient not taking: Reported on 12/29/2024) 20 capsule 0   ipratropium-albuterol  (DUONEB) 0.5-2.5 (3) MG/3ML SOLN Take 3 mLs by nebulization every 4 (four) hours as needed. (Patient not taking: Reported on 12/29/2024) 360 mL 0   lidocaine -prilocaine  (EMLA ) cream Apply to affected area once (Patient not taking: Reported on 12/29/2024) 30 g 3   methocarbamol  (ROBAXIN ) 500 MG tablet Take 1 tablet (500 mg total) by mouth every 8 (eight) hours as needed for muscle spasms. (Patient not taking: Reported on 12/29/2024) 30 tablet 0   predniSONE  (DELTASONE ) 10 MG tablet 2 each am until better then 1 each am x  5 days and stop (Patient not taking: Reported on 12/29/2024) 100 tablet 2   pregabalin  (LYRICA ) 100 MG capsule Take 1 capsule (100 mg total) by mouth 3 (three) times daily. TAKE 1 CAPSULE(100 MG) BY MOUTH THREE TIMES DAILY (Patient not taking: Reported on 12/29/2024) 90 capsule 2     Blood pressure 123/83, pulse 91, temperature 98.6 F (37 C), temperature source Oral, resp. rate 18, height 5' 5 (1.651 m), weight 74.9 kg, SpO2 94%. Physical Exam  General: No apparent distress HEENT: Head is normocephalic, atraumatic, sclera anicteric, oral mucosa pink and moist, dentition decreased Neck: Supple without JVD or lymphadenopathy Heart: Reg rate and rhythm. No murmurs rubs or gallops Chest: CTA bilaterally without wheezes, rales, or rhonchi; no distress Abdomen: Soft, non-tender, non-distended, bowel sounds positive. Extremities: No clubbing, cyanosis, or edema.   Psych: Pt's affect is appropriate. Pt is cooperative. Pleasant Skin: Warm and dry Neuro:    Mental Status: AAOx4 Speech/Languate: Naming and repetition intact, fluent, follows simple  Commands, can spell world forward, spells it backwords with 1 mistake, when asked quarters in 2.75-she initially said 7 but then corrected to 11 CRANIAL NERVES: II: PERRL. Visual fields full III, IV, VI: EOM intact, no gaze preference or deviation V: normal sensation bilaterally VII: Slight right facial weakness VIII: normal hearing to speech IX, X: normal palatal elevation XI: 5/5 head turn and 5/5 shoulder shrug bilaterally XII: Tongue midline   MOTOR: RUE: 3/5 Deltoid, 4+/5 Biceps, 4+/5 Triceps,4+/5 Grip LUE: 4+/5 Deltoid, 4+/5 Biceps, 4+/5 Triceps, 4+/5 Grip Bilateral lower extremities 4+ to 5 out of 5   REFLEXES: No ankle clonus  SENSORY: Intact to light touch in all 4 extremities although altered on her right thigh-she thinks this may be from her back  Coordination: Normal finger to nose intact  MSK: Able to AB duct right shoulder to about 80 degrees passively and actively-she has pain when lifting further.   Results for orders placed or performed during the hospital encounter of 12/27/24 (from the past 24 hours)  CBC with Differential/Platelet     Status: Abnormal   Collection Time: 12/31/24  1:28 AM  Result Value Ref Range   WBC 4.2 4.0 - 10.5 K/uL   RBC 3.33 (L) 3.87 - 5.11 MIL/uL   Hemoglobin 8.7 (L) 12.0 - 15.0 g/dL   HCT 73.4 (L) 63.9 - 53.9 %   MCV 79.6 (L) 80.0 - 100.0 fL   MCH 26.1 26.0 - 34.0 pg   MCHC 32.8 30.0 - 36.0 g/dL   RDW 80.9 (H) 88.4 - 84.4 %   Platelets 470 (H) 150 - 400 K/uL   nRBC 0.0 0.0 - 0.2 %   Neutrophils Relative % 59 %   Neutro Abs 2.5 1.7 - 7.7 K/uL   Lymphocytes Relative 25 %   Lymphs Abs 1.0 0.7 - 4.0 K/uL   Monocytes Relative 15 %   Monocytes Absolute 0.6 0.1 - 1.0 K/uL   Eosinophils Relative 0 %   Eosinophils Absolute 0.0 0.0 - 0.5 K/uL    Basophils Relative 0 %   Basophils Absolute 0.0 0.0 - 0.1 K/uL   Immature Granulocytes 1 %   Abs Immature Granulocytes 0.03 0.00 - 0.07 K/uL  Comprehensive metabolic panel with GFR     Status: None   Collection Time: 12/31/24  1:28 AM  Result Value Ref Range   Sodium 135 135 - 145 mmol/L   Potassium 3.9 3.5 - 5.1 mmol/L   Chloride 99 98 - 111 mmol/L  CO2 26 22 - 32 mmol/L   Glucose, Bld 95 70 - 99 mg/dL   BUN 12 8 - 23 mg/dL   Creatinine, Ser 9.25 0.44 - 1.00 mg/dL   Calcium  9.2 8.9 - 10.3 mg/dL   Total Protein 6.6 6.5 - 8.1 g/dL   Albumin 3.5 3.5 - 5.0 g/dL   AST 36 15 - 41 U/L   ALT 17 0 - 44 U/L   Alkaline Phosphatase 57 38 - 126 U/L   Total Bilirubin 0.2 0.0 - 1.2 mg/dL   GFR, Estimated >39 >39 mL/min   Anion gap 10 5 - 15  Magnesium      Status: None   Collection Time: 12/31/24  1:28 AM  Result Value Ref Range   Magnesium  2.2 1.7 - 2.4 mg/dL  Phosphorus     Status: None   Collection Time: 12/31/24  1:28 AM  Result Value Ref Range   Phosphorus 3.1 2.5 - 4.6 mg/dL   *Note: Due to a large number of results and/or encounters for the requested time period, some results have not been displayed. A complete set of results can be found in Results Review.   EEG adult Result Date: 12/29/2024 Shelton Arlin KIDD, MD     12/29/2024  5:44 PM Patient Name: CAMIYA VINAL MRN: [id number] Epilepsy Attending: Arlin KIDD Shelton Referring Physician/Provider: Michaela Aisha SQUIBB, MD Date: 12/29/2024 Duration: 22.47 mins Patient history: 79 y.o. female with a history of metastatic lung cancer, with a single brain metastasis who presents with confusion. EEG to evaluate for seizure Level of alertness: Awake AEDs during EEG study: PGB Technical aspects: This EEG study was done with scalp electrodes positioned according to the 10-20 International system of electrode placement. Electrical activity was reviewed with band pass filter of 1-70Hz , sensitivity of 7 uV/mm, display speed of 15mm/sec with a  60Hz  notched filter applied as appropriate. EEG data were recorded continuously and digitally stored.  Video monitoring was available and reviewed as appropriate. Description: The posterior dominant rhythm consists of 8Hz  activity of moderate voltage (25-35 uV) seen predominantly in posterior head regions, symmetric and reactive to eye opening and eye closing. EEG showed intermittent generalized 3 to 6 Hz theta-delta slowing. Hyperventilation and photic stimulation were not performed.   ABNORMALITY - Intermittent slow, generalized IMPRESSION: This study is suggestive of generalized cerebral dysfunction (encephalopathy). No seizures or definite epileptiform discharges were seen throughout the recording.  Priyanka O Yadav   VAS US  LOWER EXTREMITY VENOUS (DVT) Result Date: 12/29/2024  Lower Venous DVT Study Patient Name:  MEHA VIDRINE  Date of Exam:   12/29/2024 Medical Rec #: [id number]         Accession #:    [id number] Date of Birth: 1946-05-21         Patient Gender: F Patient Age:   27 years Exam Location:  [facility] Procedure:      VAS US  LOWER EXTREMITY VENOUS (DVT) Referring Phys: A POWELL JR --------------------------------------------------------------------------------  Indications: Pulmonary embolism.  Risk Factors: Confirmed PE Cancer Lung. Limitations: Patient positioning. Comparison Study: None. Performing Technologist: Garnette Rockers  Examination Guidelines: A complete evaluation includes B-mode imaging, spectral Doppler, color Doppler, and power Doppler as needed of all accessible portions of each vessel. Bilateral testing is considered an integral part of a complete examination. Limited examinations for reoccurring indications may be performed as noted. The reflux portion of the exam is performed with the patient in reverse Trendelenburg.  +---------+---------------+---------+-----------+----------+-------------------+ RIGHT    CompressibilityPhasicitySpontaneityPropertiesThrombus  Aging      +---------+---------------+---------+-----------+----------+-------------------+  CFV      Full           Yes      Yes                                      +---------+---------------+---------+-----------+----------+-------------------+ SFJ      Full                                                             +---------+---------------+---------+-----------+----------+-------------------+ FV Prox  Full                                                             +---------+---------------+---------+-----------+----------+-------------------+ FV Mid   Full           Yes      Yes                                      +---------+---------------+---------+-----------+----------+-------------------+ FV DistalFull                                                             +---------+---------------+---------+-----------+----------+-------------------+ PFV      Full                                                             +---------+---------------+---------+-----------+----------+-------------------+ POP      Full           Yes      Yes                                      +---------+---------------+---------+-----------+----------+-------------------+ PTV      Full                    Yes                  Not well visualized +---------+---------------+---------+-----------+----------+-------------------+ PERO                             Yes                  Not well visualized +---------+---------------+---------+-----------+----------+-------------------+   +---------+---------------+---------+-----------+----------+-------------------+ LEFT     CompressibilityPhasicitySpontaneityPropertiesThrombus Aging      +---------+---------------+---------+-----------+----------+-------------------+ CFV      Full           Yes      Yes                                       +---------+---------------+---------+-----------+----------+-------------------+  SFJ      Full                                                             +---------+---------------+---------+-----------+----------+-------------------+ FV Prox  Full                                                             +---------+---------------+---------+-----------+----------+-------------------+ FV Mid   Full           Yes      Yes                                      +---------+---------------+---------+-----------+----------+-------------------+ FV DistalFull                                                             +---------+---------------+---------+-----------+----------+-------------------+ PFV      Full                                                             +---------+---------------+---------+-----------+----------+-------------------+ POP      Full           Yes      Yes                                      +---------+---------------+---------+-----------+----------+-------------------+ PTV      Full                    Yes                                      +---------+---------------+---------+-----------+----------+-------------------+ PERO                             Yes                  Not well visualized +---------+---------------+---------+-----------+----------+-------------------+     Summary: BILATERAL: - No evidence of deep vein thrombosis seen in the lower extremities, bilaterally. -No evidence of popliteal cyst, bilaterally.   *See table(s) above for measurements and observations. Electronically signed by Penne Colorado MD on 12/29/2024 at 5:02:34 PM.    Final    ECHOCARDIOGRAM COMPLETE Result Date: 12/29/2024    ECHOCARDIOGRAM REPORT   Patient Name:   MONTOYA WATKIN Date of Exam: 12/29/2024 Medical Rec #:  [id number]        Height:       65.0 in Accession #:  [id number]       Weight:       165.1 lb Date of Birth:  01/30/1946         BSA:          1.823 m Patient Age:    78 years         BP:           103/54 mmHg Patient Gender: F                HR:           85 bpm. Exam Location:  Inpatient Procedure: 2D Echo, Pediatric Echo and Color Doppler (Both Spectral and Color            Flow Doppler were utilized during procedure). Indications:    Stroke  History:        Patient has prior history of Echocardiogram examinations, most                 recent 08/07/2021. COPD; Risk Factors:Hypertension, Diabetes and                 Dyslipidemia.  Sonographer:    Sherlean Dubin Referring Phys: [phone] A CALDWELL POWELL JR IMPRESSIONS  1. Left ventricular ejection fraction, by estimation, is 55 to 60%. The left ventricle has normal function. The left ventricle has no regional wall motion abnormalities. Left ventricular diastolic parameters are consistent with Grade I diastolic dysfunction (impaired relaxation).  2. Right ventricular systolic function is normal. The right ventricular size is normal.  3. The mitral valve is normal in structure. No evidence of mitral valve regurgitation. No evidence of mitral stenosis.  4. The aortic valve is tricuspid. Aortic valve regurgitation is not visualized. No aortic stenosis is present.  5. The inferior vena cava is normal in size with greater than 50% respiratory variability, suggesting right atrial pressure of 3 mmHg. FINDINGS  Left Ventricle: Left ventricular ejection fraction, by estimation, is 55 to 60%. The left ventricle has normal function. The left ventricle has no regional wall motion abnormalities. The left ventricular internal cavity size was normal in size. There is  no left ventricular hypertrophy. Left ventricular diastolic parameters are consistent with Grade I diastolic dysfunction (impaired relaxation). Right Ventricle: The right ventricular size is normal. Right ventricular systolic function is normal. Left Atrium: Left atrial size was normal in size. Right Atrium: Right atrial size was normal in  size. Pericardium: There is no evidence of pericardial effusion. Mitral Valve: The mitral valve is normal in structure. No evidence of mitral valve regurgitation. No evidence of mitral valve stenosis. Tricuspid Valve: The tricuspid valve is normal in structure. Tricuspid valve regurgitation is trivial. No evidence of tricuspid stenosis. Aortic Valve: The aortic valve is tricuspid. Aortic valve regurgitation is not visualized. No aortic stenosis is present. Aortic valve peak gradient measures 3.0 mmHg. Pulmonic Valve: The pulmonic valve was normal in structure. Pulmonic valve regurgitation is not visualized. No evidence of pulmonic stenosis. Aorta: The aortic root is normal in size and structure. Venous: The inferior vena cava is normal in size with greater than 50% respiratory variability, suggesting right atrial pressure of 3 mmHg. IAS/Shunts: No atrial level shunt detected by color flow Doppler.  LEFT VENTRICLE PLAX 2D LVIDd:         4.00 cm   Diastology LVIDs:         2.70 cm   LV e' medial:    6.09 cm/s LV PW:  0.80 cm   LV E/e' medial:  9.3 LV IVS:        1.00 cm   LV e' lateral:   8.49 cm/s LVOT diam:     2.00 cm   LV E/e' lateral: 6.7 LV SV:         48 LV SV Index:   26 LVOT Area:     3.14 cm  RIGHT VENTRICLE             IVC RV Basal diam:  3.00 cm     IVC diam: 1.30 cm RV Mid diam:    2.40 cm RV S prime:     12.10 cm/s TAPSE (M-mode): 1.9 cm LEFT ATRIUM           Index        RIGHT ATRIUM           Index LA diam:      2.10 cm 1.15 cm/m   RA Area:     10.80 cm LA Vol (A2C): 23.2 ml 12.72 ml/m  RA Volume:   23.40 ml  12.83 ml/m LA Vol (A4C): 23.6 ml 12.94 ml/m  AORTIC VALVE AV Area (Vmax): 3.04 cm AV Vmax:        86.65 cm/s AV Peak Grad:   3.0 mmHg LVOT Vmax:      83.95 cm/s LVOT Vmean:     58.100 cm/s LVOT VTI:       0.153 m  AORTA Ao Root diam: 2.60 cm MITRAL VALVE MV Area (PHT): 4.39 cm    SHUNTS MV Decel Time: 173 msec    Systemic VTI:  0.15 m MV E velocity: 56.60 cm/s  Systemic Diam: 2.00  cm MV A velocity: 80.50 cm/s MV E/A ratio:  0.70 Redell Shallow MD Electronically signed by Redell Shallow MD Signature Date/Time: 12/29/2024/1:30:37 PM    Final     Assessment/Plan: Diagnosis: Encephalopathy.  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 Yes Co-Morbidities requiring supervision/potential complications:  -COPD, PE, right shoulder injury, metastatic lung cancer, parotid gland mass, type 2 diabetes mellitus, hypertension, dyslipidemia, chronic pain Due to bladder management, bowel management, safety, skin/wound care, disease management, medication administration, pain management, and patient education, does the patient require 24 hr/day rehab nursing? Yes Does the patient require coordinated care of a physician, rehab nurse, therapy disciplines of PT/OT/SLP to address physical and functional deficits in the context of the above medical diagnosis(es)? Yes Addressing deficits in the following areas: balance, endurance, locomotion, strength, transferring, bowel/bladder control, bathing, dressing, feeding, grooming, toileting, cognition, speech, language, and psychosocial support Can the patient actively participate in an intensive therapy program of at least 3 hrs of therapy per day at least 5 days per week? Yes The potential for patient to make measurable gains while on inpatient rehab is excellent Anticipated functional outcomes upon discharge from inpatient rehab are modified independent  with PT, modified independent with OT, modified independent with SLP. Estimated rehab length of stay to reach the above functional goals is: 5-7 Anticipated discharge destination: Home Overall Rehab/Functional Prognosis: excellent  POST ACUTE RECOMMENDATIONS: This patient's condition is appropriate  for continued rehabilitative care in the following setting: CIR Patient has agreed to participate in recommended program. Yes Note that insurance prior authorization may be required for reimbursement for recommended care.  Comment:  I think she would be a candidate for CIR to help try to get her to MOD I since she will be by herself intermittently at home.  She also has several stairs to her in her home.   MEDICAL RECOMMENDATIONS: Consider checking x-ray of her right shoulder She reports some chronic numbness to her right thigh, potentially meralgia paresthetica versus spinal origin.  Can follow-up outpatient   I have personally performed a face to face diagnostic evaluation of this patient. Additionally, I have examined the patient's medical record including any pertinent labs and radiographic images.    Thanks,  Murray Collier, MD 12/31/2024     [1]  Allergies Allergen Reactions   Other Swelling    HAIR DYE   "

## 2024-12-31 NOTE — Discharge Instructions (Signed)
 Information on my medicine - ELIQUIS (apixaban)  This medication education was reviewed with me or my healthcare representative as part of my discharge preparation.   Why was Eliquis prescribed for you? Eliquis was prescribed to treat blood clots that may have been found in the veins of your legs (deep vein thrombosis) or in your lungs (pulmonary embolism) and to reduce the risk of them occurring again.  What do You need to know about Eliquis ? The starting dose is 10 mg (two 5 mg tablets) taken TWICE daily for the FIRST SEVEN (7) DAYS, then on  01/06/24 in the evening  the dose is reduced to ONE 5 mg tablet taken TWICE daily.  Eliquis may be taken with or without food.   Try to take the dose about the same time in the morning and in the evening. If you have difficulty swallowing the tablet whole please discuss with your pharmacist how to take the medication safely.  Take Eliquis exactly as prescribed and DO NOT stop taking Eliquis without talking to the doctor who prescribed the medication.  Stopping may increase your risk of developing a new blood clot.  Refill your prescription before you run out.  After discharge, you should have regular check-up appointments with your healthcare provider that is prescribing your Eliquis.    What do you do if you miss a dose? If a dose of ELIQUIS is not taken at the scheduled time, take it as soon as possible on the same day and twice-daily administration should be resumed. The dose should not be doubled to make up for a missed dose.  Important Safety Information A possible side effect of Eliquis is bleeding. You should call your healthcare provider right away if you experience any of the following: Bleeding from an injury or your nose that does not stop. Unusual colored urine (red or dark brown) or unusual colored stools (red or black). Unusual bruising for unknown reasons. A serious fall or if you hit your head (even if there is no  bleeding).  Some medicines may interact with Eliquis and might increase your risk of bleeding or clotting while on Eliquis. To help avoid this, consult your healthcare provider or pharmacist prior to using any new prescription or non-prescription medications, including herbals, vitamins, non-steroidal anti-inflammatory drugs (NSAIDs) and supplements.  This website has more information on Eliquis (apixaban): [email or web address]://[email or web address].[email or web address]

## 2024-12-31 NOTE — Progress Notes (Signed)
 Occupational Therapy Treatment Patient Details Name: [name] MRN: [id number] DOB: 10/20/1946 Today's Date: 12/31/2024   History of present illness Pt is a 79 y/o F admitted on 12/27/24 after presenting with c/o word finding issues, confusion, slurred speech. Brain MRI concerning for metastasis. Pt also found to have small segmental PE in R lower lobe. PMH: metastatic lung CA, COPD, HTN,, arthritis, asthma, DM, high cholesterol, neuromuscular disorder, PNA, stroke   OT comments  Pt greeted supine in bed, pt agreeable to OT intervention. Pt continues to present with decreased activity tolerance and impaired balance. Pt initially wanted to attempt functional ambulation without Rw however noted imbalance with no AD. Pt currently requires CGA for functional ambulation greater than a household distance with RW, and set- up for UB ADLs. Pt completed standing LB therex as indicated below to challenge balance and to improve LB endurance for ADL participation. Patient will benefit from intensive inpatient follow-up therapy, >3 hours/day       If plan is discharge home, recommend the following:  A little help with bathing/dressing/bathroom;A little help with walking and/or transfers;Assistance with cooking/housework;Direct supervision/assist for financial management;Supervision due to cognitive status;Assist for transportation;Direct supervision/assist for medications management;Help with stairs or ramp for entrance   Equipment Recommendations  Tub/shower seat    Recommendations for Other Services      Precautions / Restrictions Precautions Precautions: Fall Restrictions Weight Bearing Restrictions Per Provider Order: No       Mobility Bed Mobility Overal bed mobility: Modified Independent                  Transfers Overall transfer level: Needs assistance Equipment used: None Transfers: Sit to/from Stand Sit to Stand: Contact guard assist           General transfer  comment: CGA for safety     Balance Overall balance assessment: Needs assistance Sitting-balance support: Feet supported Sitting balance-Leahy Scale: Good     Standing balance support: Bilateral upper extremity supported Standing balance-Leahy Scale: Poor Standing balance comment: reliant on BUE support                           ADL either performed or assessed with clinical judgement   ADL Overall ADL's : Needs assistance/impaired                 Upper Body Dressing : Set up;Supervision/safety;Sitting Upper Body Dressing Details (indicate cue type and reason): to don back side gown     Toilet Transfer: Contact guard assist;Ambulation;Rolling walker (2 wheels) Toilet Transfer Details (indicate cue type and reason): simulated via functional ambulation         Functional mobility during ADLs: Contact guard assist;Rolling walker (2 wheels) General ADL Comments: ADL participation impacted by decreased activity tolerance    Extremity/Trunk Assessment Upper Extremity Assessment Upper Extremity Assessment: Generalized weakness;Right hand dominant   Lower Extremity Assessment Lower Extremity Assessment: Defer to PT evaluation   Cervical / Trunk Assessment Cervical / Trunk Assessment: Normal    Vision Baseline Vision/History: 0 No visual deficits Ability to See in Adequate Light: 0 Adequate Patient Visual Report: No change from baseline     Perception     Praxis     Communication Communication Communication: Impaired Factors Affecting Communication: Difficulty expressing self   Cognition Arousal: Alert Behavior During Therapy: Impulsive Cognition: No family/caregiver present to determine baseline  Following commands: Impaired Following commands impaired: Follows multi-step commands with increased time, Follows multi-step commands inconsistently      Cueing   Cueing Techniques: Verbal cues  Exercises  Other Exercises Other Exercises: x10 standing squats Other Exercises: x10 standing marches Other Exercises: x10 hip abd/add    Shoulder Instructions       General Comments VSS    Pertinent Vitals/ Pain       Pain Assessment Pain Assessment: No/denies pain  Home Living                                          Prior Functioning/Environment              Frequency  Min 2X/week        Progress Toward Goals  OT Goals(current goals can now be found in the care plan section)  Progress towards OT goals: Progressing toward goals  Acute Rehab OT Goals Patient Stated Goal: to go to rehab OT Goal Formulation: With patient/family Time For Goal Achievement: 01/12/25 Potential to Achieve Goals: Good  Plan      Co-evaluation                 AM-PAC OT 6 Clicks Daily Activity     Outcome Measure   Help from another person eating meals?: None Help from another person taking care of personal grooming?: A Little Help from another person toileting, which includes using toliet, bedpan, or urinal?: A Little Help from another person bathing (including washing, rinsing, drying)?: A Little Help from another person to put on and taking off regular upper body clothing?: None Help from another person to put on and taking off regular lower body clothing?: A Little 6 Click Score: 20    End of Session Equipment Utilized During Treatment: Gait belt;Rolling walker (2 wheels)  OT Visit Diagnosis: Muscle weakness (generalized) (M62.81);Other abnormalities of gait and mobility (R26.89);Unsteadiness on feet (R26.81)   Activity Tolerance Patient tolerated treatment well   Patient Left in bed;with call bell/phone within reach   Nurse Communication Mobility status;Other (comment) (secure chat)        Time: 1411-1430 OT Time Calculation (min): 19 min  Charges: OT General Charges $OT Visit: 1 Visit OT Treatments $Self Care/Home Management : 8-22  mins  [name], [name] Acute Rehabilitation Services [phone]'  [name] 12/31/2024, 3:10 PM

## 2024-12-31 NOTE — Plan of Care (Signed)
" °  Problem: Education: Goal: Knowledge of General Education information will improve Description: Including pain rating scale, medication(s)/side effects and non-pharmacologic comfort measures Outcome: Progressing   Problem: Health Behavior/Discharge Planning: Goal: Ability to manage health-related needs will improve Outcome: Progressing   Problem: Clinical Measurements: Goal: Ability to maintain clinical measurements within normal limits will improve Outcome: Progressing Goal: Will remain free from infection Outcome: Progressing Goal: Diagnostic test results will improve Outcome: Progressing Goal: Respiratory complications will improve Outcome: Progressing Goal: Cardiovascular complication will be avoided Outcome: Progressing   Problem: Activity: Goal: Risk for activity intolerance will decrease Outcome: Progressing   Problem: Nutrition: Goal: Adequate nutrition will be maintained Outcome: Progressing   Problem: Coping: Goal: Level of anxiety will decrease Outcome: Progressing   Problem: Elimination: Goal: Will not experience complications related to bowel motility Outcome: Progressing   Problem: Safety: Goal: Ability to remain free from injury will improve Outcome: Progressing   Problem: Education: Goal: Knowledge of disease or condition will improve Outcome: Progressing Goal: Knowledge of secondary prevention will improve (MUST DOCUMENT ALL) Outcome: Progressing Goal: Knowledge of patient specific risk factors will improve (DELETE if not current risk factor) Outcome: Progressing   Problem: Coping: Goal: Will verbalize positive feelings about self Outcome: Progressing Goal: Will identify appropriate support needs Outcome: Progressing   Problem: Health Behavior/Discharge Planning: Goal: Ability to manage health-related needs will improve Outcome: Progressing Goal: Goals will be collaboratively established with patient/family Outcome: Progressing   Problem:  Self-Care: Goal: Ability to participate in self-care as condition permits will improve Outcome: Progressing Goal: Verbalization of feelings and concerns over difficulty with self-care will improve Outcome: Progressing Goal: Ability to communicate needs accurately will improve Outcome: Progressing   Problem: Nutrition: Goal: Dietary intake will improve Outcome: Progressing   "

## 2024-12-31 NOTE — Telephone Encounter (Signed)
 Pharmacy Patient Advocate Encounter  Insurance verification completed.    The patient is insured through [employer]. Patient has Medicare and is not eligible for a copay card, but may be able to apply for patient assistance or Medicare RX Payment Plan (Patient Must reach out to their plan, if eligible for payment plan), if available.    Ran test claim for Eliquis 5mg  tablet and the current 30 day co-pay is $249 due to deductible.   This test claim was processed through [facility]- copay amounts may vary at other pharmacies due to [employer], or as the patient moves through the different stages of their insurance plan.

## 2025-01-01 DIAGNOSIS — R4701 Aphasia: Secondary | ICD-10-CM | POA: Diagnosis not present

## 2025-01-01 LAB — CBC
HCT: 29.6 % — ABNORMAL LOW (ref 36.0–46.0)
Hemoglobin: 9.5 g/dL — ABNORMAL LOW (ref 12.0–15.0)
MCH: 26 pg (ref 26.0–34.0)
MCHC: 32.1 g/dL (ref 30.0–36.0)
MCV: 81.1 fL (ref 80.0–100.0)
Platelets: 568 K/uL — ABNORMAL HIGH (ref 150–400)
RBC: 3.65 MIL/uL — ABNORMAL LOW (ref 3.87–5.11)
RDW: 19.6 % — ABNORMAL HIGH (ref 11.5–15.5)
WBC: 6.4 K/uL (ref 4.0–10.5)
nRBC: 0 % (ref 0.0–0.2)

## 2025-01-01 LAB — BASIC METABOLIC PANEL WITH GFR
Anion gap: 11 (ref 5–15)
BUN: 10 mg/dL (ref 8–23)
CO2: 27 mmol/L (ref 22–32)
Calcium: 9.4 mg/dL (ref 8.9–10.3)
Chloride: 102 mmol/L (ref 98–111)
Creatinine, Ser: 0.7 mg/dL (ref 0.44–1.00)
GFR, Estimated: >60 mL/min
Glucose, Bld: 90 mg/dL (ref 70–99)
Potassium: 4.2 mmol/L (ref 3.5–5.1)
Sodium: 140 mmol/L (ref 135–145)

## 2025-01-01 LAB — PHOSPHORUS: Phosphorus: 3.4 mg/dL (ref 2.5–4.6)

## 2025-01-01 LAB — MAGNESIUM: Magnesium: 2.2 mg/dL (ref 1.7–2.4)

## 2025-01-01 NOTE — Plan of Care (Signed)

## 2025-01-01 NOTE — Progress Notes (Signed)
 " PROGRESS NOTE    [name]  FMW:[id number] DOB: 10-16-1946 DOA: 12/27/2024 PCP: Jarold Medici, MD  Chief Complaint  Patient presents with   Altered Mental Status    Brief Narrative:   [name] is [name] 79 y.o. female with medical history significant of metastatic lung cancer, COPD, HTN and multiple other medical issues here with word finding issues, confusion, and slurred speech.   MRI without stroke, but notable for metastasis.    Evaluated by neurology who suspects presentation was due to delirium.  Improving.  Awaiting insurance auth for inpatient rehab.  Assessment & Plan:   Principal Problem:   Aphasia Active Problems:   Stroke-like symptoms  Lightheadedness Seems to be improving, sitting upright at EOB today without issue Follow orthostatics and progress with therapy Daily orthostatics Compression stockings.  Will hold bisoprolol  for now. Therapy recommending CIR   COPD Exacerbation improved Scheduled and prn nebs Per my discussion with therapy, dropped sats to 84% on RA with ambulation - short course of steroids ordered with her wheezing Negative covid, flu, RSV  Stroke Like Symptoms Woke up around noon 12/29 with confusion, slurred speech, difficulty standing R sided weakness and expressive aphasia MRI with/without with right caudate nucleus nodule increased in size - no additional intracranial lesions identified CTA head/neck without acute LVO, no evidence of ischemia by CT brain perfusion.  Moderate stennosis at L vertebral artery origin.   Echo with preserved EF, grade 1 diastolic dysfunction EEG without seizures or epileptiform discharges A1c 6.3, LDL 36 PT/OT/SLP Neurology consult appreciate assistance - suspected resolving delirium.     Segmental Pulmonary Embolism Transitioned to eliquis  Echo as above, normal RVSF   Suspected Rotator Cuff Issues Outpatient follow up   Metastatic Lung Cancer Follows with Dr. Sherrod, s/p first  round of chemo 12/11 it looks like Sees Dr. Buckley for MRI 10/2024 with findings concerning for possible met Follows with Dr. Shannon from rad onc   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   T2DM Noted Follow AM bg's   Hypertension Bisoprolol  - hold with orthostatics above  Dyslipidemia Statin  Chronic Pain Lyrica  Cymbalta   Note, she has genomic data from [facility].  Suggestions regarding medications noted.  She was on 80 mg lipitor and 40 mg protonix  prior to this admission - will continue at current doses despite suggestion to reduce dose.   Stable today, doing well.  Waiting for insurance auth for CIR.     DVT prophylaxis: heparin  gtt Code Status: full Family Communication: husband 1/1, interested in looking into CIR.  Updated daughter, 1/2.  Disposition:   Status is: Observation The patient remains OBS appropriate and will d/c before 2 midnights.   Consultants:  neurology  Procedures:  Echo IMPRESSIONS     1. Left ventricular ejection fraction, by estimation, is 55 to 60%. The  left ventricle has normal function. The left ventricle has no regional  wall motion abnormalities. Left ventricular diastolic parameters are  consistent with Grade I diastolic  dysfunction (impaired relaxation).   2. Right ventricular systolic function is normal. The right ventricular  size is normal.   3. The mitral valve is normal in structure. No evidence of mitral valve  regurgitation. No evidence of mitral stenosis.   4. The aortic valve is tricuspid. Aortic valve regurgitation is not  visualized. No aortic stenosis is present.   5. The inferior vena cava is normal in size with greater than 50%  respiratory variability, suggesting right atrial pressure of 3  mmHg.   LE US  Summary:  BILATERAL:  - No evidence of deep vein thrombosis seen in the lower extremities,  bilaterally.  -No evidence of popliteal cyst, bilaterally.   Antimicrobials:  Anti-infectives (From  admission, onward)    None       Subjective: No complaints today  Objective: Vitals:   01/01/25 0424 01/01/25 0801 01/01/25 0845 01/01/25 1212  BP: (!) 149/72 (!) 143/68  136/60  Pulse: 84 91  89  Resp:    16  Temp: 97.9 F (36.6 C)     TempSrc:      SpO2: 100% 95% 96% 94%  Weight:      Height:       No intake or output data in the 24 hours ending 01/01/25 1340  Filed Weights   12/28/24 1632  Weight: 74.9 kg    Examination:  General: No acute distress.  Lungs: unlabored Neurological: Alert and oriented 3. Moves all extremities 4 . Cranial nerves II through XII grossly intact. Extremities: No clubbing or cyanosis. No edema.   Data Reviewed: I have personally reviewed following labs and imaging studies  CBC: Recent Labs  Lab 12/27/24 1639 12/29/24 0441 12/30/24 0447 12/31/24 0128 12/31/24 1849 01/01/25 0549  WBC 3.6* 3.6* 3.9* 4.2  --  6.4  NEUTROABS 2.1 1.8 1.9 2.5  --   --   HGB 11.2* 9.7* 9.7* 8.7* 9.5* 9.5*  HCT 33.9* 30.1* 30.6* 26.5* 29.0* 29.6*  MCV 78.1* 79.0* 80.3 79.6*  --  81.1  PLT 417* 456* 504* 470*  --  568*    Basic Metabolic Panel: Recent Labs  Lab 12/27/24 1639 12/29/24 0441 12/30/24 0447 12/31/24 0128 01/01/25 0549  NA 137 136 134* 135 140  K 4.6 3.8 3.9 3.9 4.2  CL 96* 99 98 99 102  CO2 27 25 24 26 27   GLUCOSE 107* 83 115* 95 90  BUN 15 13 12 12 10   CREATININE 0.90 0.65 0.82 0.74 0.70  CALCIUM  9.6 8.8* 8.9 9.2 9.4  MG  --  2.3 2.2 2.2 2.2  PHOS  --  3.7 3.8 3.1 3.4    GFR: Estimated Creatinine Clearance: 59.7 mL/min (by C-G formula based on SCr of 0.7 mg/dL).  Liver Function Tests: Recent Labs  Lab 12/27/24 1639 12/29/24 0441 12/30/24 0447 12/31/24 0128  AST 30 36 49* 36  ALT 16 15 18 17   ALKPHOS 87 63 65 57  BILITOT 0.5 0.4 0.3 0.2  PROT 8.1 6.5 6.9 6.6  ALBUMIN 4.5 3.6 3.7 3.5    CBG: Recent Labs  Lab 12/27/24 1616 12/28/24 1525  GLUCAP 138* 96     Recent Results (from the past 240 hours)   Resp panel by RT-PCR (RSV, Flu Jalene Lacko&B, Covid) Anterior Nasal Swab     Status: None   Collection Time: 12/27/24  4:40 PM   Specimen: Anterior Nasal Swab  Result Value Ref Range Status   SARS Coronavirus 2 by RT PCR NEGATIVE NEGATIVE Final    Comment: (NOTE) SARS-CoV-2 target nucleic acids are NOT DETECTED.  The SARS-CoV-2 RNA is generally detectable in upper respiratory specimens during the acute phase of infection. The lowest concentration of SARS-CoV-2 viral copies this assay can detect is 138 copies/mL. Syan Cullimore negative result does not preclude SARS-Cov-2 infection and should not be used as the sole basis for treatment or other patient management decisions. Branon Sabine negative result may occur with  improper specimen collection/handling, submission of specimen other than nasopharyngeal swab, presence of viral mutation(s) within the  areas targeted by this assay, and inadequate number of viral copies(<138 copies/mL). Kirra Verga negative result must be combined with clinical observations, patient history, and epidemiological information. The expected result is Negative.  Fact Sheet for Patients:  bloggercourse.com  Fact Sheet for Healthcare Providers:  seriousbroker.it  This test is no t yet approved or cleared by the [place]  FDA and  has been authorized for detection and/or diagnosis of SARS-CoV-2 by FDA under an Emergency Use Authorization (EUA). This EUA will remain  in effect (meaning this test can be used) for the duration of the COVID-19 declaration under Section 564(b)(1) of the Act, 21 U.S.C.section 360bbb-3(b)(1), unless the authorization is terminated  or revoked sooner.       Influenza Caelan Branden by PCR NEGATIVE NEGATIVE Final   Influenza B by PCR NEGATIVE NEGATIVE Final    Comment: (NOTE) The Xpert Xpress SARS-CoV-2/FLU/RSV plus assay is intended as an aid in the diagnosis of influenza from Nasopharyngeal swab specimens and should not be used  as Render Marley sole basis for treatment. Nasal washings and aspirates are unacceptable for Xpert Xpress SARS-CoV-2/FLU/RSV testing.  Fact Sheet for Patients: bloggercourse.com  Fact Sheet for Healthcare Providers: seriousbroker.it  This test is not yet approved or cleared by the [place]  FDA and has been authorized for detection and/or diagnosis of SARS-CoV-2 by FDA under an Emergency Use Authorization (EUA). This EUA will remain in effect (meaning this test can be used) for the duration of the COVID-19 declaration under Section 564(b)(1) of the Act, 21 U.S.C. section 360bbb-3(b)(1), unless the authorization is terminated or revoked.     Resp Syncytial Virus by PCR NEGATIVE NEGATIVE Final    Comment: (NOTE) Fact Sheet for Patients: bloggercourse.com  Fact Sheet for Healthcare Providers: seriousbroker.it  This test is not yet approved or cleared by the [place]  FDA and has been authorized for detection and/or diagnosis of SARS-CoV-2 by FDA under an Emergency Use Authorization (EUA). This EUA will remain in effect (meaning this test can be used) for the duration of the COVID-19 declaration under Section 564(b)(1) of the Act, 21 U.S.C. section 360bbb-3(b)(1), unless the authorization is terminated or revoked.  Performed at [facility], [address], [place], [place] [address]          Radiology Studies: No results found.       Scheduled Meds:  apixaban   10 mg Oral BID   Followed by   NOREEN ON 01/05/2025] apixaban   5 mg Oral BID   arformoterol   15 mcg Nebulization BID   atorvastatin   80 mg Oral Daily   budesonide  (PULMICORT ) nebulizer solution  0.25 mg Nebulization BID   buPROPion   150 mg Oral Daily   DULoxetine   60 mg Oral Daily   famotidine   20 mg Oral QPC supper   folic acid   1 mg Oral Daily   montelukast   10 mg Oral QHS   pantoprazole   40 mg  Oral Daily   predniSONE   40 mg Oral Q breakfast   pregabalin   100 mg Oral TID   revefenacin   175 mcg Nebulization Daily   Continuous Infusions:     LOS: 3 days    Time spent: over 30 min     Meliton Monte, MD [facility]   To contact the attending provider between 7A-7P or the covering provider during after hours 7P-7A, please log into the web site [email or web address] and access using universal [place] password for that web site. If you do not have the password, please call  the hospital operator.  01/01/2025, 1:40 PM    "

## 2025-01-01 NOTE — Plan of Care (Signed)
" °  Problem: Education: Goal: Knowledge of General Education information will improve Description: Including pain rating scale, medication(s)/side effects and non-pharmacologic comfort measures Outcome: Progressing   Problem: Health Behavior/Discharge Planning: Goal: Ability to manage health-related needs will improve Outcome: Progressing   Problem: Clinical Measurements: Goal: Ability to maintain clinical measurements within normal limits will improve Outcome: Progressing Goal: Diagnostic test results will improve Outcome: Progressing Goal: Cardiovascular complication will be avoided Outcome: Progressing   Problem: Activity: Goal: Risk for activity intolerance will decrease Outcome: Progressing   Problem: Nutrition: Goal: Adequate nutrition will be maintained Outcome: Progressing   Problem: Coping: Goal: Level of anxiety will decrease Outcome: Progressing   Problem: Elimination: Goal: Will not experience complications related to bowel motility Outcome: Progressing Goal: Will not experience complications related to urinary retention Outcome: Progressing   Problem: Safety: Goal: Ability to remain free from injury will improve Outcome: Progressing   Problem: Skin Integrity: Goal: Risk for impaired skin integrity will decrease Outcome: Progressing   Problem: Education: Goal: Knowledge of disease or condition will improve Outcome: Progressing Goal: Knowledge of secondary prevention will improve (MUST DOCUMENT ALL) Outcome: Progressing Goal: Knowledge of patient specific risk factors will improve (DELETE if not current risk factor) Outcome: Progressing   Problem: Coping: Goal: Will verbalize positive feelings about self Outcome: Progressing Goal: Will identify appropriate support needs Outcome: Progressing   Problem: Health Behavior/Discharge Planning: Goal: Ability to manage health-related needs will improve Outcome: Progressing Goal: Goals will be  collaboratively established with patient/family Outcome: Progressing   Problem: Self-Care: Goal: Ability to participate in self-care as condition permits will improve Outcome: Progressing Goal: Verbalization of feelings and concerns over difficulty with self-care will improve Outcome: Progressing Goal: Ability to communicate needs accurately will improve Outcome: Progressing   Problem: Nutrition: Goal: Risk of aspiration will decrease Outcome: Progressing Goal: Dietary intake will improve Outcome: Progressing   "

## 2025-01-02 NOTE — Plan of Care (Signed)
" °  Problem: Education: Goal: Knowledge of General Education information will improve Description: Including pain rating scale, medication(s)/side effects and non-pharmacologic comfort measures Outcome: Progressing   Problem: Health Behavior/Discharge Planning: Goal: Ability to manage health-related needs will improve Outcome: Progressing   Problem: Clinical Measurements: Goal: Ability to maintain clinical measurements within normal limits will improve Outcome: Progressing Goal: Will remain free from infection Outcome: Progressing Goal: Diagnostic test results will improve Outcome: Progressing Goal: Respiratory complications will improve Outcome: Progressing Goal: Cardiovascular complication will be avoided Outcome: Progressing   Problem: Activity: Goal: Risk for activity intolerance will decrease Outcome: Progressing   Problem: Nutrition: Goal: Adequate nutrition will be maintained Outcome: Progressing   Problem: Coping: Goal: Level of anxiety will decrease Outcome: Progressing   Problem: Elimination: Goal: Will not experience complications related to bowel motility Outcome: Progressing Goal: Will not experience complications related to urinary retention Outcome: Progressing   Problem: Pain Managment: Goal: General experience of comfort will improve and/or be controlled Outcome: Progressing   Problem: Safety: Goal: Ability to remain free from injury will improve Outcome: Progressing   Problem: Skin Integrity: Goal: Risk for impaired skin integrity will decrease Outcome: Progressing   Problem: Education: Goal: Knowledge of disease or condition will improve Outcome: Progressing Goal: Knowledge of secondary prevention will improve (MUST DOCUMENT ALL) Outcome: Progressing Goal: Knowledge of patient specific risk factors will improve (DELETE if not current risk factor) Outcome: Progressing   Problem: Ischemic Stroke/TIA Tissue Perfusion: Goal: Complications of  ischemic stroke/TIA will be minimized Outcome: Progressing   Problem: Coping: Goal: Will verbalize positive feelings about self Outcome: Progressing Goal: Will identify appropriate support needs Outcome: Progressing   Problem: Health Behavior/Discharge Planning: Goal: Ability to manage health-related needs will improve Outcome: Progressing Goal: Goals will be collaboratively established with patient/family Outcome: Progressing   Problem: Self-Care: Goal: Ability to participate in self-care as condition permits will improve Outcome: Progressing Goal: Verbalization of feelings and concerns over difficulty with self-care will improve Outcome: Progressing Goal: Ability to communicate needs accurately will improve Outcome: Progressing   Problem: Nutrition: Goal: Dietary intake will improve Outcome: Progressing   "

## 2025-01-02 NOTE — Plan of Care (Signed)

## 2025-01-02 NOTE — Progress Notes (Signed)
 " PROGRESS NOTE    [name]  FMW:[id number] DOB: 12-22-1946 DOA: 12/27/2024 PCP: Jarold Medici, MD   Brief Narrative:   79 y.o. female with medical history significant of metastatic lung cancer, COPD, HTN and multiple other medical issues here with word finding issues, confusion, and slurred speech.    MRI without stroke, but notable for metastasis.    Evaluated by neurology who suspects presentation was due to delirium.   Mentation is back to normal.  Awaiting insurance auth for inpatient rehab.  Assessment & Plan:  Principal Problem:   Aphasia Active Problems:   Stroke-like symptoms   Lightheadedness Resolved Follow orthostatics and progress with therapy Daily orthostatics Compression stockings.  Will hold bisoprolol  for now. Therapy recommending CIR    COPD Exacerbation resolved Scheduled and prn nebs Per my discussion with therapy, dropped sats to 84% on RA with ambulation - short course of steroids ordered with her wheezing Negative covid, flu, RSV   Stroke Like Symptoms Woke up around noon 12/29 with confusion, slurred speech, difficulty standing R sided weakness and expressive aphasia MRI with/without with right caudate nucleus nodule increased in size - no additional intracranial lesions identified CTA head/neck without acute LVO, no evidence of ischemia by CT brain perfusion.  Moderate stennosis at L vertebral artery origin.   Echo with preserved EF, grade 1 diastolic dysfunction EEG without seizures or epileptiform discharges A1c 6.3, LDL 36 PT/OT/SLP Neurology consult appreciate assistance - suspected resolving delirium.     Segmental Pulmonary Embolism Transitioned to eliquis  Echo as above, normal RVSF   Suspected Rotator Cuff Issues Outpatient follow up    Metastatic Lung Cancer Follows with Dr. Sherrod, s/p first round of chemo 12/11 it looks like Sees Dr. Buckley for MRI 10/2024 with findings concerning for possible met Follows with Dr.  Shannon from rad onc   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   T2DM Monitor BG levels closely   Hypertension Bisoprolol  - hold with orthostatics above   Dyslipidemia Statin   Chronic Pain Lyrica  Cymbalta   Disposition: Pending CIR placement. Medically ready.   DVT prophylaxis:  apixaban  (ELIQUIS ) tablet 10 mg  apixaban  (ELIQUIS ) tablet 5 mg     Code Status: Full Code Family Communication:  None at the bedside Status is: Inpatient Remains inpatient appropriate because: Pending CIR    Subjective:  She is doing well. She was having breakfast. Denied any active complaints. She was living at home with her husband prior to this hospitalization.  Examination:  General exam: Appears calm and comfortable  Respiratory system: Clear to auscultation. Respiratory effort normal. Cardiovascular system: S1 & S2 heard, RRR. No JVD, murmurs, rubs, gallops or clicks. No pedal edema. Gastrointestinal system: Abdomen is nondistended, soft and nontender. No organomegaly or masses felt. Normal bowel sounds heard. Central nervous system: Alert and oriented. No focal neurological deficits. Extremities: Symmetric 5 x 5 power. Skin: No rashes, lesions or ulcers Psychiatry: Judgement and insight appear normal. Mood & affect appropriate.    Diet Orders (From admission, onward)     Start     Ordered   12/28/24 1500  Diet regular Room service appropriate? Yes; Fluid consistency: Thin  Diet effective now       Question Answer Comment  Room service appropriate? Yes   Fluid consistency: Thin      12/28/24 1500            Objective: Vitals:   01/01/25 2038 01/01/25 2136 01/02/25 0436 01/02/25 0753  BP: (!) 152/69  ROLLEN)  157/80 107/69  Pulse: 78  93 94  Resp:   17 20  Temp: 98.4 F (36.9 C)  98.1 F (36.7 C) 98.7 F (37.1 C)  TempSrc: Oral  Oral Oral  SpO2: 99% 99% 100% 98%  Weight:      Height:       No intake or output data in the 24 hours ending 01/02/25  0941 Filed Weights   12/28/24 1632  Weight: 74.9 kg    Scheduled Meds:  apixaban   10 mg Oral BID   Followed by   NOREEN ON 01/05/2025] apixaban   5 mg Oral BID   arformoterol   15 mcg Nebulization BID   atorvastatin   80 mg Oral Daily   budesonide  (PULMICORT ) nebulizer solution  0.25 mg Nebulization BID   buPROPion   150 mg Oral Daily   DULoxetine   60 mg Oral Daily   famotidine   20 mg Oral QPC supper   folic acid   1 mg Oral Daily   montelukast   10 mg Oral QHS   pantoprazole   40 mg Oral Daily   predniSONE   40 mg Oral Q breakfast   pregabalin   100 mg Oral TID   revefenacin   175 mcg Nebulization Daily   Continuous Infusions:  Nutritional status     Body mass index is 27.48 kg/m.  Data Reviewed:   CBC: Recent Labs  Lab 12/27/24 1639 12/29/24 0441 12/30/24 0447 12/31/24 0128 12/31/24 1849 01/01/25 0549  WBC 3.6* 3.6* 3.9* 4.2  --  6.4  NEUTROABS 2.1 1.8 1.9 2.5  --   --   HGB 11.2* 9.7* 9.7* 8.7* 9.5* 9.5*  HCT 33.9* 30.1* 30.6* 26.5* 29.0* 29.6*  MCV 78.1* 79.0* 80.3 79.6*  --  81.1  PLT 417* 456* 504* 470*  --  568*   Basic Metabolic Panel: Recent Labs  Lab 12/27/24 1639 12/29/24 0441 12/30/24 0447 12/31/24 0128 01/01/25 0549  NA 137 136 134* 135 140  K 4.6 3.8 3.9 3.9 4.2  CL 96* 99 98 99 102  CO2 27 25 24 26 27   GLUCOSE 107* 83 115* 95 90  BUN 15 13 12 12 10   CREATININE 0.90 0.65 0.82 0.74 0.70  CALCIUM  9.6 8.8* 8.9 9.2 9.4  MG  --  2.3 2.2 2.2 2.2  PHOS  --  3.7 3.8 3.1 3.4   GFR: Estimated Creatinine Clearance: 59.7 mL/min (by C-G formula based on SCr of 0.7 mg/dL). Liver Function Tests: Recent Labs  Lab 12/27/24 1639 12/29/24 0441 12/30/24 0447 12/31/24 0128  AST 30 36 49* 36  ALT 16 15 18 17   ALKPHOS 87 63 65 57  BILITOT 0.5 0.4 0.3 0.2  PROT 8.1 6.5 6.9 6.6  ALBUMIN 4.5 3.6 3.7 3.5   No results for input(s): LIPASE, AMYLASE in the last 168 hours. Recent Labs  Lab 12/27/24 1753  AMMONIA <13   Coagulation Profile: Recent Labs   Lab 12/27/24 1639  INR 1.0   Cardiac Enzymes: No results for input(s): CKTOTAL, CKMB, CKMBINDEX, TROPONINI in the last 168 hours. BNP (last 3 results) Recent Labs    06/28/24 1654  PROBNP 78.9   HbA1C: No results for input(s): HGBA1C in the last 72 hours. CBG: Recent Labs  Lab 12/27/24 1616 12/28/24 1525  GLUCAP 138* 96   Lipid Profile: No results for input(s): CHOL, HDL, LDLCALC, TRIG, CHOLHDL, LDLDIRECT in the last 72 hours. Thyroid  Function Tests: No results for input(s): TSH, T4TOTAL, FREET4, T3FREE, THYROIDAB in the last 72 hours. Anemia Panel: No results for input(s):  VITAMINB12, FOLATE, FERRITIN, TIBC, IRON, RETICCTPCT in the last 72 hours. Sepsis Labs: Recent Labs  Lab 12/27/24 1647  LATICACIDVEN 1.5    Recent Results (from the past 240 hours)  Resp panel by RT-PCR (RSV, Flu A&B, Covid) Anterior Nasal Swab     Status: None   Collection Time: 12/27/24  4:40 PM   Specimen: Anterior Nasal Swab  Result Value Ref Range Status   SARS Coronavirus 2 by RT PCR NEGATIVE NEGATIVE Final    Comment: (NOTE) SARS-CoV-2 target nucleic acids are NOT DETECTED.  The SARS-CoV-2 RNA is generally detectable in upper respiratory specimens during the acute phase of infection. The lowest concentration of SARS-CoV-2 viral copies this assay can detect is 138 copies/mL. A negative result does not preclude SARS-Cov-2 infection and should not be used as the sole basis for treatment or other patient management decisions. A negative result may occur with  improper specimen collection/handling, submission of specimen other than nasopharyngeal swab, presence of viral mutation(s) within the areas targeted by this assay, and inadequate number of viral copies(<138 copies/mL). A negative result must be combined with clinical observations, patient history, and epidemiological information. The expected result is Negative.  Fact Sheet for Patients:   bloggercourse.com  Fact Sheet for Healthcare Providers:  seriousbroker.it  This test is no t yet approved or cleared by the [place]  FDA and  has been authorized for detection and/or diagnosis of SARS-CoV-2 by FDA under an Emergency Use Authorization (EUA). This EUA will remain  in effect (meaning this test can be used) for the duration of the COVID-19 declaration under Section 564(b)(1) of the Act, 21 U.S.C.section 360bbb-3(b)(1), unless the authorization is terminated  or revoked sooner.       Influenza A by PCR NEGATIVE NEGATIVE Final   Influenza B by PCR NEGATIVE NEGATIVE Final    Comment: (NOTE) The Xpert Xpress SARS-CoV-2/FLU/RSV plus assay is intended as an aid in the diagnosis of influenza from Nasopharyngeal swab specimens and should not be used as a sole basis for treatment. Nasal washings and aspirates are unacceptable for Xpert Xpress SARS-CoV-2/FLU/RSV testing.  Fact Sheet for Patients: bloggercourse.com  Fact Sheet for Healthcare Providers: seriousbroker.it  This test is not yet approved or cleared by the [place]  FDA and has been authorized for detection and/or diagnosis of SARS-CoV-2 by FDA under an Emergency Use Authorization (EUA). This EUA will remain in effect (meaning this test can be used) for the duration of the COVID-19 declaration under Section 564(b)(1) of the Act, 21 U.S.C. section 360bbb-3(b)(1), unless the authorization is terminated or revoked.     Resp Syncytial Virus by PCR NEGATIVE NEGATIVE Final    Comment: (NOTE) Fact Sheet for Patients: bloggercourse.com  Fact Sheet for Healthcare Providers: seriousbroker.it  This test is not yet approved or cleared by the [place]  FDA and has been authorized for detection and/or diagnosis of SARS-CoV-2 by FDA under an Emergency Use  Authorization (EUA). This EUA will remain in effect (meaning this test can be used) for the duration of the COVID-19 declaration under Section 564(b)(1) of the Act, 21 U.S.C. section 360bbb-3(b)(1), unless the authorization is terminated or revoked.  Performed at [facility], [address] [place], [place] [address]          Radiology Studies: No results found.         LOS: 4 days   Time spent= 35 mins    Deliliah Room, MD [facility]  If 7PM-7AM, please contact night-coverage  01/02/2025,  9:41 AM  "

## 2025-01-03 MED ORDER — FAMOTIDINE 20 MG PO TABS: 20.0000 mg | ORAL_TABLET | Freq: Every day | ORAL | Status: AC

## 2025-01-03 MED ORDER — APIXABAN 5 MG PO TABS: ORAL_TABLET | ORAL | 0 refills | Status: DC

## 2025-01-03 NOTE — Progress Notes (Signed)
 Discharge Nurse Summary: DC order noted per MD. DC RN at bedside with patient. Patient agreeable with discharge plan, agreeable to transport to dc lounge to await family pickup. AVS printed/reviewed. PIV removed. Telemonitor returned to charging station. No DME. No home/TOC meds. CP/Edu resolved. Patient dressed and ready to go. All belongings accounted for. Patient wheeled downstairs to [employer].  Rosario Lund, RN

## 2025-01-03 NOTE — Plan of Care (Signed)

## 2025-01-03 NOTE — Progress Notes (Signed)
 Occupational Therapy Treatment Patient Details Name: [name] MRN: [id number] DOB: Oct 13, 1946 Today's Date: 01/03/2025   History of present illness Pt is a 79 y/o F admitted on 12/27/24 after presenting with c/o word finding issues, confusion, slurred speech. Brain MRI concerning for metastasis. Pt also found to have small segmental PE in R lower lobe. PMH: metastatic lung CA, COPD, HTN,, arthritis, asthma, DM, high cholesterol, neuromuscular disorder, PNA, stroke   OT comments  Pt making progress with functional goals and reports that she will being d/c home today shortly. Pt requires CGA/Sup for safety for LB ADLs and ADL mobility 1 person HHA. Pt states that she has been walking around in room with no AD or assist from staff and that she does not need AIR and will d/c home with assist prn from spouse and her daughter; says that her daughter will be picking her up today. OT recommends a shower chair for home use.       If plan is discharge home, recommend the following:  A little help with bathing/dressing/bathroom;A little help with walking and/or transfers;Assistance with cooking/housework;Direct supervision/assist for financial management;Supervision due to cognitive status;Assist for transportation;Direct supervision/assist for medications management;Help with stairs or ramp for entrance   Equipment Recommendations  Tub/shower seat    Recommendations for Other Services      Precautions / Restrictions Precautions Precautions: Fall Restrictions Weight Bearing Restrictions Per Provider Order: No       Mobility Bed Mobility               General bed mobility comments: pt sitting EOB upon OT arrival    Transfers Overall transfer level: Needs assistance Equipment used: None Transfers: Sit to/from Stand Sit to Stand: Contact guard assist, Supervision           General transfer comment: CGA/Sup  for safety     Balance                                            ADL either performed or assessed with clinical judgement   ADL Overall ADL's : Needs assistance/impaired     Grooming: Wash/dry hands;Wash/dry face;Oral care;Contact guard assist;Standing           Upper Body Dressing : Set up;Sitting   Lower Body Dressing: Contact guard assist;Supervision/safety;Sit to/from stand   Toilet Transfer: Print Production Planner Details (indicate cue type and reason): HHA Toileting- Clothing Manipulation and Hygiene: Contact guard assist;Supervision/safety;Sit to/from stand       Functional mobility during ADLs: Contact guard assist;Rolling walker (2 wheels)      Extremity/Trunk Assessment Upper Extremity Assessment Upper Extremity Assessment: Generalized weakness;Right hand dominant   Lower Extremity Assessment Lower Extremity Assessment: Defer to PT evaluation        Vision Baseline Vision/History: 0 No visual deficits Ability to See in Adequate Light: 0 Adequate Patient Visual Report: No change from baseline     Perception     Praxis     Communication Communication Communication: Impaired   Cognition Arousal: Alert Behavior During Therapy: Impulsive Cognition: No family/caregiver present to determine baseline                               Following commands: Impaired Following commands impaired: Follows multi-step commands with increased time, Follows multi-step commands inconsistently  Cueing   Cueing Techniques: Verbal cues  Exercises      Shoulder Instructions       General Comments      Pertinent Vitals/ Pain       Pain Assessment Pain Assessment: No/denies pain  Home Living                                          Prior Functioning/Environment              Frequency  Min 2X/week        Progress Toward Goals  OT Goals(current goals can now be found in the care plan section)  Progress towards OT goals:  Progressing toward goals     Plan      Co-evaluation                 AM-PAC OT 6 Clicks Daily Activity     Outcome Measure   Help from another person eating meals?: None Help from another person taking care of personal grooming?: A Little Help from another person toileting, which includes using toliet, bedpan, or urinal?: A Little Help from another person bathing (including washing, rinsing, drying)?: A Little Help from another person to put on and taking off regular upper body clothing?: A Little Help from another person to put on and taking off regular lower body clothing?: A Little 6 Click Score: 19    End of Session Equipment Utilized During Treatment: Gait belt  OT Visit Diagnosis: Muscle weakness (generalized) (M62.81);Other abnormalities of gait and mobility (R26.89);Unsteadiness on feet (R26.81)   Activity Tolerance Patient tolerated treatment well   Patient Left in bed;with call bell/phone within reach   Nurse Communication Mobility status        Time: 8884-8866 OT Time Calculation (min): 18 min  Charges: OT General Charges $OT Visit: 1 Visit OT Treatments $Self Care/Home Management : 8-22 mins    Jacques Karna Loose 01/03/2025, 3:36 PM

## 2025-01-03 NOTE — TOC Transition Note (Signed)
 Transition of Care [facility]) - Discharge Note   Patient Details  Name: [name] MRN: [id number] Date of Birth: 08/30/1946  [facility] CM/SW Contact:  [name], RN Phone Number: 01/03/2025, 11:20 AM   Clinical Narrative:    Patient is no independent and not needing CIR for home.  Patient states that she has been walking without need for DME nor home health.  I called and spoke with the daughter and updated her by phone.  Patient plans to return home with spouse by car today.         Patient Goals and CMS Choice            Discharge Placement                       Discharge Plan and Services Additional resources added to the After Visit Summary for                                       Social Drivers of Health (SDOH) Interventions SDOH Screenings   Food Insecurity: No Food Insecurity (12/28/2024)  Housing: Low Risk (12/28/2024)  Transportation Needs: No Transportation Needs (12/28/2024)  Utilities: Not At Risk (12/28/2024)  Alcohol Screen: Low Risk (09/13/2024)  Depression (PHQ2-9): Low Risk (12/09/2024)  Financial Resource Strain: Medium Risk (09/13/2024)  Physical Activity: Inactive (09/13/2024)  Social Connections: Socially Integrated (12/28/2024)  Stress: Stress Concern Present (09/13/2024)  Tobacco Use: Medium Risk (12/27/2024)  Health Literacy: Adequate Health Literacy (03/17/2024)     Readmission Risk Interventions    06/30/2024   12:54 PM  Readmission Risk Prevention Plan  Post Dischage Appt Complete  Medication Screening Complete  Transportation Screening Complete

## 2025-01-03 NOTE — Discharge Summary (Addendum)
 " Physician Discharge Summary   Patient: [name] MRN: [id number] DOB: 25-Feb-1946  Admit date:     12/27/2024  Discharge date: 01/03/2025  Discharge Physician: Deliliah Room   PCP: Jarold Medici, MD   Recommendations at discharge:    F/u with your PCP in one week F/u with your oncologist on the scheduled appointment.  Discharge Diagnoses: Principal Problem:   Aphasia Active Problems:   Stroke-like symptoms    Hospital Course:  79 y.o. female with medical history significant of metastatic lung cancer, COPD, HTN and multiple other medical issues here with word finding issues, confusion, and slurred speech.    MRI without stroke, but notable for metastasis.    Evaluated by neurology who suspects presentation was due to delirium.   Mentation is back to normal. She is able to walk independently and is AAO x 3 so there is no indication for Inpatient rehab placement.  Lightheadedness Resolved Will hold bisoprolol  for now.   COPD Exacerbation resolved Not needing any supplemental oxygen at discharge. Negative covid, flu, RSV   Stroke Like Symptoms Woke up around noon 12/29 with confusion, slurred speech, difficulty standing R sided weakness and expressive aphasia MRI with/without with right caudate nucleus nodule increased in size - no additional intracranial lesions identified CTA head/neck without acute LVO, no evidence of ischemia by CT brain perfusion.  Moderate stenosis at L vertebral artery origin.   Echo with preserved EF, grade 1 diastolic dysfunction EEG without seizures or epileptiform discharges A1c 6.3, LDL 36 PT/OT/SLP done Neurology consult appreciate assistance - suspected resolving delirium.    Patient is active, alert and oriented x 3 at the time of the discharge.   Segmental Pulmonary Embolism Transitioned to eliquis  Echo as above, normal RVSF   Suspected Rotator Cuff Issues Outpatient follow up    Metastatic Lung Cancer/Stage IV (TX, N2, M1  C) non-small cell lung cancer, adenocarcinoma presented with right hilar lymphadenopathy as well as left parotid gland metastasis in addition to liver, pancreatic tail and muscular metastasis diagnosed in October 2025.  Follows with Dr. Sherrod, s/p first round of chemo 12/11  Sees Dr. Buckley for MRI 10/2024 with findings concerning for possible met Follows with Dr. Shannon from rad onc  I informed Cassandra PA-C on the day of the discharge and oncology office will schedule an appointment for her.   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   T2DM Monitor BG levels closely   Hypertension Held Bisoprolol    Dyslipidemia Statin   Chronic Pain Lyrica  Cymbalta    Disposition: Home with HHPT     Consultants: Neurology Procedures performed: None  Disposition: Home health Diet recommendation:  Cardiac diet DISCHARGE MEDICATION: Allergies as of 01/03/2025       Reactions   Other Swelling   HAIR DYE        Medication List     STOP taking these medications    aspirin  81 MG tablet   bisoprolol  5 MG tablet Commonly known as: ZEBETA    predniSONE  10 MG tablet Commonly known as: DELTASONE        TAKE these medications    albuterol  108 (90 Base) MCG/ACT inhaler Commonly known as: VENTOLIN  HFA Inhale 2 puffs into the lungs every 4 (four) hours as needed for wheezing or shortness of breath.   apixaban  5 MG Tabs tablet Commonly known as: ELIQUIS  Take 2 tablets (10 mg total) by mouth 2 (two) times daily for 3 days, THEN 1 tablet (5 mg total) 2 (two) times daily. Start  taking on: January 03, 2025   arformoterol  15 MCG/2ML Nebu Commonly known as: BROVANA  Take 2 mLs (15 mcg total) by nebulization 2 (two) times daily.   atorvastatin  80 MG tablet Commonly known as: LIPITOR TAKE 1 TABLET BY MOUTH EVERY DAY MONDAY THROUGH FRIDAY   benzonatate  200 MG capsule Commonly known as: TESSALON  Take 1 capsule (200 mg total) by mouth 3 (three) times daily.   Biotin 89999  MCG Tbdp Take 10,000 mcg by mouth daily.   budesonide  0.5 MG/2ML nebulizer solution Commonly known as: PULMICORT  Take 2 mLs (0.5 mg total) by nebulization 2 (two) times daily.   buPROPion  150 MG 24 hr tablet Commonly known as: WELLBUTRIN  XL Take 150 mg by mouth daily.   CALCIUM  600 PO Take 600 mg by mouth daily.   CeleBREX 200 MG capsule Generic drug: celecoxib Take 200 mg by mouth daily.   cholecalciferol 25 MCG (1000 UNIT) tablet Commonly known as: VITAMIN D3 Take 1,000 Units by mouth daily.   DULoxetine  60 MG capsule Commonly known as: CYMBALTA  TAKE 1 CAPSULE(60 MG) BY MOUTH DAILY   famotidine  20 MG tablet Commonly known as: PEPCID  Take 1 tablet (20 mg total) by mouth daily.   folic acid  1 MG tablet Commonly known as: FOLVITE  Take 1 tablet (1 mg total) by mouth daily. Start 7 days before pemetrexed  chemotherapy. Continue until 21 days after pemetrexed  completed.   ipratropium-albuterol  0.5-2.5 (3) MG/3ML Soln Commonly known as: DUONEB Take 3 mLs by nebulization every 4 (four) hours as needed.   lidocaine -prilocaine  cream Commonly known as: EMLA  Apply to affected area once   magnesium  gluconate 500 (27 Mg) MG Tabs tablet Commonly known as: MAGONATE Take 500 mg by mouth daily.   methocarbamol  500 MG tablet Commonly known as: ROBAXIN  Take 1 tablet (500 mg total) by mouth every 8 (eight) hours as needed for muscle spasms.   montelukast  10 MG tablet Commonly known as: SINGULAIR  TAKE 1 TABLET(10 MG) BY MOUTH AT BEDTIME   ondansetron  8 MG tablet Commonly known as: Zofran  Take 1 tablet (8 mg total) by mouth every 8 (eight) hours as needed for nausea or vomiting. Start on the third day after carboplatin .   oxyCODONE -acetaminophen  10-325 MG tablet Commonly known as: Percocet Take 1 tablet by mouth every 6 (six) hours as needed for pain.   Ozempic  (1 MG/DOSE) 4 MG/3ML Sopn Generic drug: Semaglutide  (1 MG/DOSE) Inject 1 mg into the skin once a week. Friday    pantoprazole  40 MG tablet Commonly known as: PROTONIX  TAKE 1 TABLET BY MOUTH 30 MINUTES BEFORE FIRST MEAL OF THE DAY   pregabalin  100 MG capsule Commonly known as: LYRICA  Take 1 capsule (100 mg total) by mouth 3 (three) times daily. TAKE 1 CAPSULE(100 MG) BY MOUTH THREE TIMES DAILY   prochlorperazine  10 MG tablet Commonly known as: COMPAZINE  Take 1 tablet (10 mg total) by mouth every 6 (six) hours as needed for nausea or vomiting.   Repatha  SureClick 140 MG/ML Soaj Generic drug: Evolocumab  ADMINISTER 1 ML UNDER THE SKIN EVERY 14 DAYS               Durable Medical Equipment  (From admission, onward)           Start     Ordered   12/29/24 1637  For home use only DME Bedside commode  Once       Comments: Please provide 3:1 since patient is unable to mobilize to bathroom at home.  Question Answer Comment  Patient needs a  bedside commode to treat with the following condition Confusion   Patient needs a bedside commode to treat with the following condition Physical deconditioning      12/29/24 1637            Contact information for follow-up providers     [facility] Follow up.   Why: [employer] will deliver a 3:1 to the bedside before you are discharged to home. Contact information: [address] [place] [address] [phone]         Jarold Medici, MD. Schedule an appointment as soon as possible for a visit in 1 week(s).   Specialty: Internal Medicine Contact information: [address] [place] [address] [phone]         Heilingoetter, Cassandra L, PA-C. Schedule an appointment as soon as possible for a visit in 1 week(s).   Specialty: Physician Assistant Contact information: [address] [place] [address] [phone]              Contact information for after-discharge care     Home Medical Care     [facility]) .   Service: Home Health  Services Contact information: [address] [place]  [address] [phone]                    Discharge Exam: Fredricka Weights   12/28/24 1632  Weight: 74.9 kg   Constitutional: NAD, calm, comfortable Eyes: PERRL, lids and conjunctivae normal ENMT: Mucous membranes are moist. Posterior pharynx clear of any exudate or lesions.Normal dentition.  Neck: normal, supple, no masses, no thyromegaly Respiratory: clear to auscultation bilaterally, no wheezing, no crackles. Normal respiratory effort. No accessory muscle use.  Cardiovascular: Regular rate and rhythm, no murmurs / rubs / gallops. No extremity edema. 2+ pedal pulses. No carotid bruits.  Abdomen: no tenderness, no masses palpated. No hepatosplenomegaly. Bowel sounds positive.  Musculoskeletal: no clubbing / cyanosis. No joint deformity upper and lower extremities. Good ROM, no contractures. Normal muscle tone.  Skin: no rashes, lesions, ulcers. No induration Neurologic: CN 2-12 grossly intact. Sensation intact, DTR normal. Strength 5/5 x all 4 extremities.  Psychiatric: Normal judgment and insight. Alert and oriented x 3. Normal mood.    Condition at discharge: good  The results of significant diagnostics from this hospitalization (including imaging, microbiology, ancillary and laboratory) are listed below for reference.   Imaging Studies: EEG adult Result Date: 12/29/2024 Shelton Arlin KIDD, MD     12/29/2024  5:44 PM Patient Name: DEYANA WNUK MRN: [id number] Epilepsy Attending: Arlin KIDD Shelton Referring Physician/Provider: Michaela Aisha SQUIBB, MD Date: 12/29/2024 Duration: 22.47 mins Patient history: 79 y.o. female with a history of metastatic lung cancer, with a single brain metastasis who presents with confusion. EEG to evaluate for seizure Level of alertness: Awake AEDs during EEG study: PGB Technical aspects: This EEG study was done with scalp electrodes positioned according to  the 10-20 International system of electrode placement. Electrical activity was reviewed with band pass filter of 1-70Hz , sensitivity of 7 uV/mm, display speed of 11mm/sec with a 60Hz  notched filter applied as appropriate. EEG data were recorded continuously and digitally stored.  Video monitoring was available and reviewed as appropriate. Description: The posterior dominant rhythm consists of 8Hz  activity of moderate voltage (25-35 uV) seen predominantly in posterior head regions, symmetric and reactive to eye opening and eye closing. EEG showed intermittent generalized 3 to 6 Hz theta-delta slowing. Hyperventilation and  photic stimulation were not performed.   ABNORMALITY - Intermittent slow, generalized IMPRESSION: This study is suggestive of generalized cerebral dysfunction (encephalopathy). No seizures or definite epileptiform discharges were seen throughout the recording.  Priyanka O Yadav   VAS US  LOWER EXTREMITY VENOUS (DVT) Result Date: 12/29/2024  Lower Venous DVT Study Patient Name:  NIJAE DOYEL  Date of Exam:   12/29/2024 Medical Rec #: [id number]         Accession #:    [id number] Date of Birth: [birth year]         Patient Gender: F Patient Age:   30 years Exam Location:  [facility] Procedure:      VAS US  LOWER EXTREMITY VENOUS (DVT) Referring Phys: A POWELL JR --------------------------------------------------------------------------------  Indications: Pulmonary embolism.  Risk Factors: Confirmed PE Cancer Lung. Limitations: Patient positioning. Comparison Study: None. Performing Technologist: Garnette Rockers  Examination Guidelines: A complete evaluation includes B-mode imaging, spectral Doppler, color Doppler, and power Doppler as needed of all accessible portions of each vessel. Bilateral testing is considered an integral part of a complete examination. Limited examinations for reoccurring indications may be performed as noted. The reflux portion of the exam is performed with the  patient in reverse Trendelenburg.  +---------+---------------+---------+-----------+----------+-------------------+ RIGHT    CompressibilityPhasicitySpontaneityPropertiesThrombus Aging      +---------+---------------+---------+-----------+----------+-------------------+ CFV      Full           Yes      Yes                                      +---------+---------------+---------+-----------+----------+-------------------+ SFJ      Full                                                             +---------+---------------+---------+-----------+----------+-------------------+ FV Prox  Full                                                             +---------+---------------+---------+-----------+----------+-------------------+ FV Mid   Full           Yes      Yes                                      +---------+---------------+---------+-----------+----------+-------------------+ FV DistalFull                                                             +---------+---------------+---------+-----------+----------+-------------------+ PFV      Full                                                             +---------+---------------+---------+-----------+----------+-------------------+  POP      Full           Yes      Yes                                      +---------+---------------+---------+-----------+----------+-------------------+ PTV      Full                    Yes                  Not well visualized +---------+---------------+---------+-----------+----------+-------------------+ PERO                             Yes                  Not well visualized +---------+---------------+---------+-----------+----------+-------------------+   +---------+---------------+---------+-----------+----------+-------------------+ LEFT     CompressibilityPhasicitySpontaneityPropertiesThrombus Aging       +---------+---------------+---------+-----------+----------+-------------------+ CFV      Full           Yes      Yes                                      +---------+---------------+---------+-----------+----------+-------------------+ SFJ      Full                                                             +---------+---------------+---------+-----------+----------+-------------------+ FV Prox  Full                                                             +---------+---------------+---------+-----------+----------+-------------------+ FV Mid   Full           Yes      Yes                                      +---------+---------------+---------+-----------+----------+-------------------+ FV DistalFull                                                             +---------+---------------+---------+-----------+----------+-------------------+ PFV      Full                                                             +---------+---------------+---------+-----------+----------+-------------------+ POP      Full           Yes      Yes                                      +---------+---------------+---------+-----------+----------+-------------------+  PTV      Full                    Yes                                      +---------+---------------+---------+-----------+----------+-------------------+ PERO                             Yes                  Not well visualized +---------+---------------+---------+-----------+----------+-------------------+     Summary: BILATERAL: - No evidence of deep vein thrombosis seen in the lower extremities, bilaterally. -No evidence of popliteal cyst, bilaterally.   *See table(s) above for measurements and observations. Electronically signed by Penne Colorado MD on 12/29/2024 at 5:02:34 PM.    Final    ECHOCARDIOGRAM COMPLETE Result Date: 12/29/2024    ECHOCARDIOGRAM REPORT   Patient Name:   NIAYA HICKOK Date of  Exam: 12/29/2024 Medical Rec #:  [id number]        Height:       65.0 in Accession #:    [id number]       Weight:       165.1 lb Date of Birth:  Sep 14, 1946        BSA:          1.823 m Patient Age:    78 years         BP:           103/54 mmHg Patient Gender: F                HR:           85 bpm. Exam Location:  Inpatient Procedure: 2D Echo, Pediatric Echo and Color Doppler (Both Spectral and Color            Flow Doppler were utilized during procedure). Indications:    Stroke  History:        Patient has prior history of Echocardiogram examinations, most                 recent 08/07/2021. COPD; Risk Factors:Hypertension, Diabetes and                 Dyslipidemia.  Sonographer:    Sherlean Dubin Referring Phys: [phone] A CALDWELL POWELL JR IMPRESSIONS  1. Left ventricular ejection fraction, by estimation, is 55 to 60%. The left ventricle has normal function. The left ventricle has no regional wall motion abnormalities. Left ventricular diastolic parameters are consistent with Grade I diastolic dysfunction (impaired relaxation).  2. Right ventricular systolic function is normal. The right ventricular size is normal.  3. The mitral valve is normal in structure. No evidence of mitral valve regurgitation. No evidence of mitral stenosis.  4. The aortic valve is tricuspid. Aortic valve regurgitation is not visualized. No aortic stenosis is present.  5. The inferior vena cava is normal in size with greater than 50% respiratory variability, suggesting right atrial pressure of 3 mmHg. FINDINGS  Left Ventricle: Left ventricular ejection fraction, by estimation, is 55 to 60%. The left ventricle has normal function. The left ventricle has no regional wall motion abnormalities. The left ventricular internal cavity size was normal in size. There is  no left ventricular hypertrophy. Left ventricular diastolic parameters are consistent with Grade I diastolic dysfunction (impaired  relaxation). Right Ventricle: The right ventricular  size is normal. Right ventricular systolic function is normal. Left Atrium: Left atrial size was normal in size. Right Atrium: Right atrial size was normal in size. Pericardium: There is no evidence of pericardial effusion. Mitral Valve: The mitral valve is normal in structure. No evidence of mitral valve regurgitation. No evidence of mitral valve stenosis. Tricuspid Valve: The tricuspid valve is normal in structure. Tricuspid valve regurgitation is trivial. No evidence of tricuspid stenosis. Aortic Valve: The aortic valve is tricuspid. Aortic valve regurgitation is not visualized. No aortic stenosis is present. Aortic valve peak gradient measures 3.0 mmHg. Pulmonic Valve: The pulmonic valve was normal in structure. Pulmonic valve regurgitation is not visualized. No evidence of pulmonic stenosis. Aorta: The aortic root is normal in size and structure. Venous: The inferior vena cava is normal in size with greater than 50% respiratory variability, suggesting right atrial pressure of 3 mmHg. IAS/Shunts: No atrial level shunt detected by color flow Doppler.  LEFT VENTRICLE PLAX 2D LVIDd:         4.00 cm   Diastology LVIDs:         2.70 cm   LV e' medial:    6.09 cm/s LV PW:         0.80 cm   LV E/e' medial:  9.3 LV IVS:        1.00 cm   LV e' lateral:   8.49 cm/s LVOT diam:     2.00 cm   LV E/e' lateral: 6.7 LV SV:         48 LV SV Index:   26 LVOT Area:     3.14 cm  RIGHT VENTRICLE             IVC RV Basal diam:  3.00 cm     IVC diam: 1.30 cm RV Mid diam:    2.40 cm RV S prime:     12.10 cm/s TAPSE (M-mode): 1.9 cm LEFT ATRIUM           Index        RIGHT ATRIUM           Index LA diam:      2.10 cm 1.15 cm/m   RA Area:     10.80 cm LA Vol (A2C): 23.2 ml 12.72 ml/m  RA Volume:   23.40 ml  12.83 ml/m LA Vol (A4C): 23.6 ml 12.94 ml/m  AORTIC VALVE AV Area (Vmax): 3.04 cm AV Vmax:        86.65 cm/s AV Peak Grad:   3.0 mmHg LVOT Vmax:      83.95 cm/s LVOT Vmean:     58.100 cm/s LVOT VTI:       0.153 m  AORTA Ao Root  diam: 2.60 cm MITRAL VALVE MV Area (PHT): 4.39 cm    SHUNTS MV Decel Time: 173 msec    Systemic VTI:  0.15 m MV E velocity: 56.60 cm/s  Systemic Diam: 2.00 cm MV A velocity: 80.50 cm/s MV E/A ratio:  0.70 Redell Shallow MD Electronically signed by Redell Shallow MD Signature Date/Time: 12/29/2024/1:30:37 PM    Final    MR BRAIN W WO CONTRAST Result Date: 12/29/2024 EXAM: MRI BRAIN WITH AND WITHOUT CONTRAST 12/28/2024 09:18:00 PM TECHNIQUE: Multiplanar multisequence MRI of the head/brain was performed with and without the administration of intravenous contrast. CONTRAST: 7 mL of Gadobutrol . COMPARISON: MR Head 11/17/2024. CLINICAL HISTORY: Neuro deficit, acute, stroke suspected. FINDINGS: BRAIN AND VENTRICLES: A 3 to 4  mm nodule previously noted within the anterior body of the caudate nucleus on the right has increased to 4 to 5 mm in diameter and is concerning for a metastasis. There are no additional lesions within the brain demonstrated, but the study is degraded by patient motion. There is extensive diffuse cerebral white matter disease again demonstrated. No acute infarct. No acute intracranial hemorrhage. No mass effect or midline shift. No hydrocephalus. The sella is unremarkable. Normal flow voids. ORBITS: No acute abnormality. SINUSES: No acute abnormality. BONES AND SOFT TISSUES: Normal bone marrow signal and enhancement. A circumscribed, peripherally enhancing mass is again seen along the lateral surface of the left masseter, measuring approximately 2.0 x 1.8 x 2.3 cm, unchanged from the previous study. IMPRESSION: 1. Right caudate nucleus nodule has mildly increased in size, concerning for metastasis; no additional intracranial lesions are identified, although evaluation is limited by patient motion. 2. Extensive diffuse cerebral white matter disease. 3. Circumscribed, peripherally enhancing mass along the lateral surface of the left masseter, unchanged from the prior study. The mass remains  concerning for primary or metastatic neoplasm. Electronically signed by: Evalene Coho MD 12/29/2024 04:40 AM EST RP Workstation: [id number]   CT Angio Chest PE W and/or Wo Contrast Result Date: 12/28/2024 EXAM: CTA CHEST 12/27/2024 11:35:46 PM TECHNIQUE: CTA of the chest was performed after the administration of intravenous contrast. Multiplanar reformatted images are provided for review. MIP images are provided for review. Automated exposure control, iterative reconstruction, and/or weight based adjustment of the mA/kV was utilized to reduce the radiation dose to as low as reasonably achievable. COMPARISON: Chest CT 10/12/2024 CLINICAL HISTORY: Pulmonary embolism (PE) suspected, low to intermediate prob, positive D-dimer. FINDINGS: PULMONARY ARTERIES: Pulmonary arteries are adequately opacified for evaluation. There is a small segmental pulmonary embolus in the right lower lobe. No other pulmonary embolism. Main pulmonary artery is normal in caliber. MEDIASTINUM: Calcific aortic atherosclerosis and coronary artery calcification. The heart and pericardium demonstrate no acute abnormality. No CT evidence of right heart strain. Right chest wall port-a-cath terminates at the cavoatrial junction. Calcific aortic atherosclerosis is present. There is no acute aortic dissection, intramural hematoma, aortic ulceration, or rupture. LYMPH NODES: No mediastinal, hilar or axillary lymphadenopathy. LUNGS AND PLEURA: Unchanged region of scarring/fibrosis in the right upper lobe status post radiation. 4 mm right middle lobe perifissural nodule (. series 303 image 59). No focal consolidation or pulmonary edema. No evidence of pleural effusion or pneumothorax. UPPER ABDOMEN: Limited images of the upper abdomen are unremarkable. SOFT TISSUES AND BONES: No acute bone or soft tissue abnormality. IMPRESSION: 1. Small segmental pulmonary embolus in the right lower lobe. No CT evidence of right heart strain or other pulmonary  embolism. 2. Unchanged region of scarring/fibrosis in the right upper lobe status post radiation. Findings communicated to Dr. Wanita at 2:13 AM on 12/28/2024. Electronically signed by: Franky Stanford MD 12/28/2024 02:13 AM EST RP Workstation: [id number]   DG Chest Portable 1 View Result Date: 12/27/2024 CLINICAL DATA:  Altered mental status. EXAM: PORTABLE CHEST 1 VIEW COMPARISON:  06/28/2024, CT 10/12/2024 FINDINGS: Right chest port with tip overlying the SVC. The lungs are hyperinflated. Bronchial thickening. Periphery of the lung bases is excluded from the field of view. The heart is normal in size. Bilateral hilar prominence likely due to enlarged pulmonary arteries on CT. Fiducial marker with the nodular density in the right upper lobe. No evidence of acute airspace disease. No pneumothorax or large pleural effusion. IMPRESSION: 1. Hyperinflation and bronchial thickening. No evidence of acute  airspace disease. 2. Fiducial marker with nodular density in the right upper lobe. Electronically Signed   By: Andrea Gasman M.D.   On: 12/27/2024 18:41   CT ANGIO HEAD NECK W WO CM W PERF (CODE STROKE) Result Date: 12/27/2024 EXAM: CTA Head and Neck with Perfusion 12/27/2024 05:39:38 PM TECHNIQUE: CTA of the head and neck was performed without and with the administration of 100 mL of iohexol  (OMNIPAQUE ) 350 MG/ML injection. 3D postprocessing with multiplanar reconstructions and MIPs was performed to evaluate the vascular anatomy. Cerebral perfusion analysis using computed tomography with contrast administration, including post-processing of parametric maps with determination of cerebral blood flow, cerebral blood volume, mean transit time and time-to-maximum. Automated exposure control, iterative reconstruction, and/or weight based adjustment of the mA/kV was utilized to reduce the radiation dose to as low as reasonably achievable. COMPARISON: CT head dated 12/27/2024 CLINICAL HISTORY: Neuro deficit, acute,  stroke suspected FINDINGS: CTA NECK: AORTIC ARCH AND ARCH VESSELS: Mild atherosclerosis of the partially visualized aortic arch. Atherosclerosis involves the aortic arch vessels without high grade stenosis. No dissection or arterial injury. No significant stenosis of the brachiocephalic or subclavian arteries. CERVICAL CAROTID ARTERIES: Calcified atherosclerosis at the right carotid bifurcation without hemodynamically significant stenosis. Calcification at the left carotid bifurcation without hemodynamically significant stenosis. No dissection or arterial injury. CERVICAL VERTEBRAL ARTERIES: Atherosclerosis at the left vertebral artery origin resulting in moderate stenosis. Additional atherosclerosis at the right vertebral artery origin and along the right V1 segment resulting in mild stenosis. Atherosclerosis of the left V4 segment results in mild stenosis. No dissection or arterial injury. LUNGS AND MEDIASTINUM: There is abnormal soft tissue within the suprahilar aspect of the right upper lobe which is partially visualized and corresponds to findings from the CT chest on 10/12/2024. Scarring in the right upper lobe surrounds multiple upper lobe pulmonary artery branches. Treatment partially visualized right chest wall Port-A-Cath. SOFT TISSUES: There is a 1.9 x 1.8 cm mass involving the left masseter muscle adjacent to the anterior aspect of the left parotid gland corresponding to lesion seen on the MRI from 11/17/2024. Finding could reflect a metastatic lesion versus a primary parotid neoplasm. BONES: Degenerative changes in the visualized spine. Edentulous maxilla. CTA HEAD: ANTERIOR CIRCULATION: Atherosclerosis of the bilateral carotid siphons. There is mild stenosis of the bilateral cavernous and supraclinoid ICAs. No significant stenosis of the anterior cerebral arteries. No significant stenosis of the middle cerebral arteries. No aneurysm. POSTERIOR CIRCULATION: No significant stenosis of the posterior  cerebral arteries. No significant stenosis of the basilar artery. No significant stenosis of the vertebral arteries. No aneurysm. OTHER: No dural venous sinus thrombosis on this non-dedicated study. CT PERFUSION: EXAM QUALITY: The CT perfusion images are mildly degraded by motion artifact. Exam quality is adequate with diagnostic perfusion maps. Appropriate arterial inflow and venous outflow curves. CORE INFARCT (CBF<30% volume): 0 mL TOTAL HYPOPERFUSION (Tmax>6s volume): 7 mL PENUMBRA: Mismatch volume: 7 mL Mismatch ratio: Not applicable Location: These regions of elevated Tmax are primarily within the inferior aspects of the temporal lobes adjacent to the skull base and are favored to be artifactual. IMPRESSION: 1. No acute large vessel occlusion. 2. No evidence of ischemia by CT brain perfusion. 3. Moderate stenosis at the left vertebral artery origin. Otherwise no high-grade stenosis or aneurysm in the head or neck vessels. 4. A 1.9 cm left masseter/parotid-adjacent mass, which could reflect metastatic disease versus a primary parotid neoplasm. Unchanged from recent MRI. 5. Impression #1-2 discussed with Dr. Matthews at 5:47 PM on 12/27/24.  Electronically signed by: Donnice Mania MD 12/27/2024 06:04 PM EST RP Workstation: [id number]   CT HEAD CODE STROKE WO CONTRAST` Result Date: 12/27/2024 EXAM: CT HEAD WITHOUT 12/27/2024 05:15:52 PM TECHNIQUE: CT of the head was performed without the administration of intravenous contrast. Automated exposure control, iterative reconstruction, and/or weight based adjustment of the mA/kV was utilized to reduce the radiation dose to as low as reasonably achievable. COMPARISON: MRI head 11/17/2024. CLINICAL HISTORY: Neuro deficit, acute, stroke suspected. FINDINGS: BRAIN AND VENTRICLES: No acute intracranial hemorrhage. No mass effect or midline shift. No extra-axial fluid collection. No evidence of acute infarct. No hydrocephalus. Patchy white matter hypodensities, compatible  with chronic microvascular ischemic disease. Basal ganglia calcifications. Calcifications of the dentate nuclei. Calcific atherosclerosis. Lesion in the anterior body of the right caudate nucleus seen on the prior MRI is not well evaluated on the current study. [place] Stroke Program Early CT (ASPECT) score: Ganglionic (caudate, internal capsule, lentiform nucleus, insula, M1-M3): 7 Supraganglionic (M4-M6): 3 Total: 10 ORBITS: No acute abnormality. SINUSES AND MASTOIDS: No acute abnormality. SOFT TISSUES AND SKULL: No acute skull fracture. No acute soft tissue abnormality. IMPRESSION: 1. No acute intracranial abnormality. 2. ASPECTS score is 10. 3. Lesion in the anterior body of the right caudate nucleus described on the prior MRI is not well evaluated on the current CT. 4. Findings messaged to Dr. Rosemarie via the [facility] messaging system at 5:27 PM on 12/27/24. Electronically signed by: Donnice Mania MD 12/27/2024 05:28 PM EST RP Workstation: [id number]    Microbiology: Results for orders placed or performed during the hospital encounter of 12/27/24  Resp panel by RT-PCR (RSV, Flu A&B, Covid) Anterior Nasal Swab     Status: None   Collection Time: 12/27/24  4:40 PM   Specimen: Anterior Nasal Swab  Result Value Ref Range Status   SARS Coronavirus 2 by RT PCR NEGATIVE NEGATIVE Final    Comment: (NOTE) SARS-CoV-2 target nucleic acids are NOT DETECTED.  The SARS-CoV-2 RNA is generally detectable in upper respiratory specimens during the acute phase of infection. The lowest concentration of SARS-CoV-2 viral copies this assay can detect is 138 copies/mL. A negative result does not preclude SARS-Cov-2 infection and should not be used as the sole basis for treatment or other patient management decisions. A negative result may occur with  improper specimen collection/handling, submission of specimen other than nasopharyngeal swab, presence of viral mutation(s) within the areas targeted by this assay, and  inadequate number of viral copies(<138 copies/mL). A negative result must be combined with clinical observations, patient history, and epidemiological information. The expected result is Negative.  Fact Sheet for Patients:  bloggercourse.com  Fact Sheet for Healthcare Providers:  seriousbroker.it  This test is no t yet approved or cleared by the [place]  FDA and  has been authorized for detection and/or diagnosis of SARS-CoV-2 by FDA under an Emergency Use Authorization (EUA). This EUA will remain  in effect (meaning this test can be used) for the duration of the COVID-19 declaration under Section 564(b)(1) of the Act, 21 U.S.C.section 360bbb-3(b)(1), unless the authorization is terminated  or revoked sooner.       Influenza A by PCR NEGATIVE NEGATIVE Final   Influenza B by PCR NEGATIVE NEGATIVE Final    Comment: (NOTE) The Xpert Xpress SARS-CoV-2/FLU/RSV plus assay is intended as an aid in the diagnosis of influenza from Nasopharyngeal swab specimens and should not be used as a sole basis for treatment. Nasal washings and aspirates are unacceptable for Xpert Xpress SARS-CoV-2/FLU/RSV testing.  Fact Sheet for Patients: bloggercourse.com  Fact Sheet for Healthcare Providers: seriousbroker.it  This test is not yet approved or cleared by the [place]  FDA and has been authorized for detection and/or diagnosis of SARS-CoV-2 by FDA under an Emergency Use Authorization (EUA). This EUA will remain in effect (meaning this test can be used) for the duration of the COVID-19 declaration under Section 564(b)(1) of the Act, 21 U.S.C. section 360bbb-3(b)(1), unless the authorization is terminated or revoked.     Resp Syncytial Virus by PCR NEGATIVE NEGATIVE Final    Comment: (NOTE) Fact Sheet for Patients: bloggercourse.com  Fact Sheet for Healthcare  Providers: seriousbroker.it  This test is not yet approved or cleared by the [place]  FDA and has been authorized for detection and/or diagnosis of SARS-CoV-2 by FDA under an Emergency Use Authorization (EUA). This EUA will remain in effect (meaning this test can be used) for the duration of the COVID-19 declaration under Section 564(b)(1) of the Act, 21 U.S.C. section 360bbb-3(b)(1), unless the authorization is terminated or revoked.  Performed at [facility], [address] [place], [place] [address]    *Note: Due to a large number of results and/or encounters for the requested time period, some results have not been displayed. A complete set of results can be found in Results Review.    Labs: CBC: Recent Labs  Lab 12/27/24 1639 12/29/24 0441 12/30/24 0447 12/31/24 0128 12/31/24 1849 01/01/25 0549  WBC 3.6* 3.6* 3.9* 4.2  --  6.4  NEUTROABS 2.1 1.8 1.9 2.5  --   --   HGB 11.2* 9.7* 9.7* 8.7* 9.5* 9.5*  HCT 33.9* 30.1* 30.6* 26.5* 29.0* 29.6*  MCV 78.1* 79.0* 80.3 79.6*  --  81.1  PLT 417* 456* 504* 470*  --  568*   Basic Metabolic Panel: Recent Labs  Lab 12/27/24 1639 12/29/24 0441 12/30/24 0447 12/31/24 0128 01/01/25 0549  NA 137 136 134* 135 140  K 4.6 3.8 3.9 3.9 4.2  CL 96* 99 98 99 102  CO2 27 25 24 26 27   GLUCOSE 107* 83 115* 95 90  BUN 15 13 12 12 10   CREATININE 0.90 0.65 0.82 0.74 0.70  CALCIUM  9.6 8.8* 8.9 9.2 9.4  MG  --  2.3 2.2 2.2 2.2  PHOS  --  3.7 3.8 3.1 3.4   Liver Function Tests: Recent Labs  Lab 12/27/24 1639 12/29/24 0441 12/30/24 0447 12/31/24 0128  AST 30 36 49* 36  ALT 16 15 18 17   ALKPHOS 87 63 65 57  BILITOT 0.5 0.4 0.3 0.2  PROT 8.1 6.5 6.9 6.6  ALBUMIN 4.5 3.6 3.7 3.5   CBG: Recent Labs  Lab 12/27/24 1616 12/28/24 1525  GLUCAP 138* 96    Discharge time spent: 40 minutes.  Signed: Deliliah Room, MD [facility] 01/03/2025 "

## 2025-01-04 ENCOUNTER — Telehealth: Payer: Self-pay | Admitting: *Deleted

## 2025-01-04 NOTE — Transitions of Care (Post Inpatient/ED Visit) (Signed)
" ° °  01/04/2025  Name: DODIE PARISI MRN: [id number] DOB: 09-21-1946  Today's TOC FU Call Status: Today's TOC FU Call Status:: Unsuccessful Call (1st Attempt) Unsuccessful Call (1st Attempt) Date: 01/04/25  Attempted to reach the patient regarding the most recent Inpatient/ED visit.  Follow Up Plan: Additional outreach attempts will be made to reach the patient to complete the Transitions of Care (Post Inpatient/ED visit) call.   Cathlean Headland BSN RN [facility] Health Care Management Coordinator [email or web address] Direct Dial: [phone]  Fax: [phone] Website: [email or web address]  "

## 2025-01-05 ENCOUNTER — Telehealth: Payer: Self-pay

## 2025-01-05 NOTE — Transitions of Care (Post Inpatient/ED Visit) (Signed)
" ° °  01/05/2025  Name: [name] MRN: [id number] DOB: Jan 31, 1946  Today's TOC FU Call Status: Today's TOC FU Call Status:: Unsuccessful Call (2nd Attempt) Unsuccessful Call (2nd Attempt) Date: 01/05/25  Attempted to reach the patient regarding the most recent Inpatient/ED visit.  Follow Up Plan: Additional outreach attempts will be made to reach the patient to complete the Transitions of Care (Post Inpatient/ED visit) call.   Alan Ee, RN, BSN, CEN Population Health- Transition of Care Team.  [facility] [phone]  "

## 2025-01-06 ENCOUNTER — Telehealth: Payer: Self-pay

## 2025-01-06 NOTE — Transitions of Care (Post Inpatient/ED Visit) (Signed)
 "  01/06/2025  Name: [name] MRN: [id number] DOB: 1946/10/22  Today's TOC FU Call Status: Today's TOC FU Call Status:: Successful TOC FU Call Completed TOC FU Call Complete Date: 01/06/25  Patient's Name and Date of Birth confirmed. Name, DOB  Transition Care Management Follow-up Telephone Call Discharge Facility: [facility]) Type of Discharge: Inpatient Admission Primary Inpatient Discharge Diagnosis:: Aphasia (They initially thought I had a stroke but they said that it was not, it was a confusing hospital stay, and I just need to talk to my doctor, I really don't feel up to talking.) How have you been since you were released from the hospital?: Better Any questions or concerns?: Yes Patient Questions/Concerns:: The only thing is I can't afford the Eliquis  that was suggested, I was on Aspirin  and they stopped it and suppose to be on Eliquis  that costing over $250 that my insurance won't cover. Patient Questions/Concerns Addressed: Notified Provider of Patient Questions/Concerns, Other: (Patient states she wants to call her PCP to get an understanding of the change and see if she can get a different medication if the Aspirin  is not what she should be taking)  Items Reviewed: Did you receive and understand the discharge instructions provided?: Yes (Received it but they really didn't explain why the changes just the changes, but not the reason) Medications obtained,verified, and reconciled?: Partial Review Completed Reason for Partial Mediation Review: Wanted to go over only the medications that were changed and the Eliquis  she can't afford Any new allergies since your discharge?: No Do you have support at home?: Yes People in Home [RPT]: spouse Name of Support/Comfort Primary Source: [place]  Medications Reviewed Today:  Decline full review states she will speak with PCP about medication changes that are confusing to her and the cost of the Eliquis  she did not  get Medications Reviewed Today   Medications were not reviewed in this encounter     Home Care and Equipment/Supplies: Were Home Health Services Ordered?: NA (Was on AVS but patient was independent and no HH was ordered patient states, no need for it) Any new equipment or medical supplies ordered?: No  Functional Questionnaire: Do you need assistance with bathing/showering or dressing?: No Do you need assistance with meal preparation?: No Do you need assistance with eating?: No Do you have difficulty maintaining continence: No Do you need assistance with getting out of bed/getting out of a chair/moving?: No Do you have difficulty managing or taking your medications?: No  Follow up appointments reviewed: PCP Follow-up appointment confirmed?: Yes Date of PCP follow-up appointment?: 01/10/25 Follow-up Provider: Catheryn Slocumb, MD [facility] Follow-up appointment confirmed?: Yes Date of Specialist follow-up appointment?: 01/13/25 Follow-Up Specialty Provider:: Oncology Do you need transportation to your follow-up appointment?: No Do you understand care options if your condition(s) worsen?: Yes-patient verbalized understanding (I will call Dr. Slocumb office today and see her Monday)  SDOH Interventions Today    Flowsheet Row Most Recent Value  SDOH Interventions   Food Insecurity Interventions Intervention Not Indicated  Housing Interventions Intervention Not Indicated  Transportation Interventions Intervention Not Indicated  Utilities Interventions Intervention Not Indicated   Education for self-mgmt of aphasia - noted possible delirium provided during this outreach regarding: -s/s of worsening condition and when to seek medical attention -importance of completing all post discharge hospital hospital follow up appts -adherence to med regimen [facility] TOC 30-day program enrollment reviewed and discussed with pt/caregiver. They have declined enrollment in  30 day TOC program due to:  Patient states, I have too much going on right now for anything extra and once I speak with Dr. Jarold I feel like I will have in place what I need.   Richerd Fish, RN, BSN, CCM [facility] Management Coordinator Direct Dial: [phone]        "

## 2025-01-07 ENCOUNTER — Telehealth: Payer: Self-pay | Admitting: Pharmacist

## 2025-01-07 DIAGNOSIS — Z79899 Other long term (current) drug therapy: Secondary | ICD-10-CM

## 2025-01-08 NOTE — Progress Notes (Unsigned)
" ° °  01/08/2025 Name: [name] MRN: [id number] DOB: [birth year]  Chief Complaint  Patient presents with   Medication Assistance    Eliquis     [name] is a 79 y.o. year old female who presented for a telephone visit.   They were referred to the pharmacist by their PCP for assistance in managing medication access. (Eliquis )   Subjective: [name] is a 79 year old female with multiple medical conditions including but not limited to:  asthma, metastatic lung cancer,  atherosclerosis, diastolic heart failure, obesity, COPD, hypertension, hyperlipidemia, and type 2 diabetes.  She was recently hospitalized for aphasia and stroke-like symptoms. Patient expressed concern about the cost of Eliquis  and why she was started on it because she said she was told she did NOT have a stroke.  Note from discharging Physician stated the Patient's MIRI was negative for stroke but notable for metastasis.  CT scan from 12/27/24 listed finding as Small segmental pulmonary embolus in the right lower lobe. No CT evidence of right heart strain or other pulmonary embolism.  Care Team: Primary Care Provider: Jarold Medici, MD ; Upcoming visit 01/11/24   Medication Access/Adherence  Current Pharmacy:  [id number] - [place], [place] - [address] AT [facility] [address] & [facility] [address]  [place] [address] Phone: [phone] Fax: [phone]   Patient reports affordability concerns with their medications: Yes  Eliquis  Patient reports access/transportation concerns to their pharmacy: No  Patient reports adherence concerns with their medications:  Yes  Eliquis  due to cost    Patient reported when she went to the Pharmacy to pick up Eliquis , she was told it would be $249.  She wondered about using a copay card but was educated that copay cards are not allowed when the Patient has Medicare (or any Government supported- non barista).    Objective:  Lab Results  Component Value Date   HGBA1C 6.3 (H) 12/28/2024    Lab Results  Component Value Date   CREATININE 0.70 01/01/2025   BUN 10 01/01/2025   NA 140 01/01/2025   K 4.2 01/01/2025   CL 102 01/01/2025   CO2 27 01/01/2025    Lab Results  Component Value Date   CHOL 95 12/29/2024   HDL 45 12/29/2024   LDLCALC 36 12/29/2024   TRIG 70 12/29/2024   CHOLHDL 2.1 12/29/2024      Assessment/Plan:  Eliquis  is offered through [employer]'s patient assistance program.  However, they require Patient's to spend at least 3% of their income in medication expenses to be eligible. Since it is at the beginning of the year and since most of the Patient's medications are generic, she most likely has not spent this amount yet.  Patient has a Health Visitor through [employer]. She has a $250 deductible on Medications tier 3 and above.  After the deductible is met, tier 3 medications have a $47 copay.     Follow Up Plan:    Route note to PCP.    "

## 2025-01-10 ENCOUNTER — Other Ambulatory Visit (HOSPITAL_COMMUNITY): Payer: Self-pay

## 2025-01-10 ENCOUNTER — Encounter: Payer: Self-pay | Admitting: Internal Medicine

## 2025-01-10 ENCOUNTER — Telehealth: Payer: Self-pay | Admitting: Pharmacist

## 2025-01-10 ENCOUNTER — Encounter: Payer: Self-pay | Admitting: Pharmacist

## 2025-01-10 ENCOUNTER — Telehealth: Payer: Self-pay

## 2025-01-10 ENCOUNTER — Ambulatory Visit: Payer: Self-pay | Admitting: Internal Medicine

## 2025-01-10 VITALS — BP 100/80 | HR 100 | Temp 98.3°F | Ht 65.0 in | Wt 154.6 lb

## 2025-01-10 DIAGNOSIS — E1169 Type 2 diabetes mellitus with other specified complication: Secondary | ICD-10-CM

## 2025-01-10 DIAGNOSIS — J449 Chronic obstructive pulmonary disease, unspecified: Secondary | ICD-10-CM

## 2025-01-10 DIAGNOSIS — I2693 Single subsegmental pulmonary embolism without acute cor pulmonale: Secondary | ICD-10-CM

## 2025-01-10 DIAGNOSIS — R2990 Unspecified symptoms and signs involving the nervous system: Secondary | ICD-10-CM

## 2025-01-10 DIAGNOSIS — R630 Anorexia: Secondary | ICD-10-CM

## 2025-01-10 DIAGNOSIS — C7889 Secondary malignant neoplasm of other digestive organs: Secondary | ICD-10-CM | POA: Insufficient documentation

## 2025-01-10 DIAGNOSIS — R4701 Aphasia: Secondary | ICD-10-CM

## 2025-01-10 DIAGNOSIS — D508 Other iron deficiency anemias: Secondary | ICD-10-CM

## 2025-01-10 DIAGNOSIS — I5032 Chronic diastolic (congestive) heart failure: Secondary | ICD-10-CM

## 2025-01-10 DIAGNOSIS — Z79899 Other long term (current) drug therapy: Secondary | ICD-10-CM

## 2025-01-10 DIAGNOSIS — I110 Hypertensive heart disease with heart failure: Secondary | ICD-10-CM

## 2025-01-10 DIAGNOSIS — M7581 Other shoulder lesions, right shoulder: Secondary | ICD-10-CM

## 2025-01-10 MED ORDER — APIXABAN (ELIQUIS) VTE STARTER PACK (10MG AND 5MG)
ORAL_TABLET | ORAL | 0 refills | Status: AC
  Filled 2025-01-10: qty 74, 30d supply, fill #0
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
## 2025-01-10 NOTE — Progress Notes (Signed)
 Complex Care Management Note Care Guide Note  01/10/2025 Name: [name] MRN: [id number] DOB: 1946/03/07   Complex Care Management Outreach Attempts: An unsuccessful telephone outreach was attempted today to offer the patient information about available complex care management services.  Follow Up Plan:  Additional outreach attempts will be made to offer the patient complex care management information and services.   Encounter Outcome:  No Answer  Debbe Fuse [facility], [facility] Guide  Direct Dial: [phone]  Fax [phone]

## 2025-01-10 NOTE — Patient Instructions (Signed)
Aphasia Aphasia is a language disorder. It affects the part of the brain that is used to communicate. Aphasia does not affect intelligence, but a person may have trouble: Speaking. Understanding speech. Reading. Writing. Some people with aphasia may also have trouble with memory or attention. Aphasia can happen to anyone at any age. It is most common in older adults. What are the causes? This condition is caused by damage to the language centers of the brain. Damage may be caused by: Stroke. This causes reduced blood flow to certain areas of the brain. Stroke is the most common cause of aphasia. Traumatic brain injury (TBI). Brain tumor. Infection of the brain tissues. Nervous system disease that gradually gets worse, such as dementia or multiple sclerosis (MS). This is called progressive neurological disorder. Brain surgery. What are the signs or symptoms? Symptoms of this condition include: Trouble finding the right words or expressing thoughts and needs through speech. Using the wrong words, nonsense words, or jargon. Talking in sentences that do not make sense or that are not grammatically correct. Being unable to repeat back words and phrases. Difficulty expressing ideas through writing or not understanding what you are reading. Being unable to understand other people's speech. Having trouble understanding numbers. The condition affects people differently. Symptoms may start suddenly or come on gradually, depending on the underlying cause. How is this diagnosed? This condition may be diagnosed based on a screening of your ability to communicate as soon as symptoms start, or when you are medically stable after a stroke or brain injury. Later, a more comprehensive assessment may be done in the hospital or at a rehabilitation center. The assessment may test your ability to: Use speech to communicate your needs. Use muscles in your mouth and throat for speaking and swallowing. Express  ideas with speech or other means of communication, such as hand gestures. Make conversation with others across a variety of topics. Hear and understand speech. Understand and produce written material. Manage memory and attention associated with communication. How is this treated? Treatment for this condition depends on your needs and abilities. The goal is to help restore your ability to communicate or find ways to manage communication challenges. Common treatments include: Speech-language therapy. Part of this may include: Rebuilding intonation, sentence structure, and vocabulary. Learning other ways to communicate, such as using word books, communication boards, or special software programs. Learning to communicate with writing, sign language, or hand gestures. Working with family members. This may include: Learning ways to communicate. Emotional support. Occupational therapy. This can help to find devices to assist with daily living. Treatment usually begins as soon as possible. It may begin while you are in the hospital and continue in a rehabilitation center or at home. In some cases, aphasia may improve quickly on its own. In other cases, recovery occurs more slowly over time. Follow these instructions at home:  Make sure you have a good support system at home. Find a support group. This can help you connect with others who are going through the same thing. Try the following tips while communicating: Use short, simple sentences. Ask family members to do the same. Sentences that require one-word or short answers are easiest. Avoid distractions like background noise when trying to listen or talk. Try communicating with gestures, pointing, writing, or drawing. Talk slowly. Ask family members to talk to you slowly. Maintain eye contact when communicating. Ask family members to do the same when communicating with you. Ask family members to give you time to  respond or to engage in  conversation with them. Keep all follow-up visits. Where to find support National Aphasia Association: www.aphasia.org Contact a health care provider if: Your symptoms change or get worse. You are struggling with anxiety or depression. Get help right away if: You have any symptoms of a stroke. "BE FAST" is an easy way to remember the main warning signs of a stroke: B - Balance. Signs are dizziness, sudden trouble walking, or loss of balance. E - Eyes. Signs are trouble seeing or a sudden change in vision. F - Face. Signs are sudden weakness or numbness of the face, or the face or eyelid drooping on one side. A - Arms. Signs are weakness or numbness in an arm. This happens suddenly and usually on one side of the body. S - Speech. Signs are sudden trouble speaking, slurred speech, or trouble understanding what people say. T - Time. Time to call emergency services. Write down what time symptoms started. You have other signs of a stroke, such as: A sudden, severe headache with no known cause. Nausea or vomiting. Seizure. These symptoms may be an emergency. Get help right away. Call 911. Do not wait to see if the symptoms will go away. Do not drive yourself to the hospital. Summary Aphasia is a language disorder. It may cause difficulty with speech, writing, reading, or understanding others. In some cases, aphasia may improve quickly on its own. In other cases, recovery occurs more slowly over time. Get help right away if you have symptoms of a stroke. This information is not intended to replace advice given to you by your health care provider. Make sure you discuss any questions you have with your health care provider. Document Revised: 01/25/2022 Document Reviewed: 01/25/2022 Elsevier Patient Education  2024 [employer].

## 2025-01-10 NOTE — Telephone Encounter (Signed)
-----   Message from Cassius Brought sent at 01/09/2025  7:38 AM EST ----- Regarding: Eliquis  Coupon Hello and Happy New Year!  Do you have any 1 month free trial coupons for Eliquis  in your clinic?  I have a Patient discharge on Eliquis  who cannot afford it due to her $250 deductible.  She said she was not given a coupon at discharge.  Blessings and thank you in advance,   Cassius DOROTHA Brought, PharmD, [facility] Clinical Pharmacist [phone]

## 2025-01-10 NOTE — Progress Notes (Signed)
" ° °  01/10/2025 Name: [name] MRN: [id number] DOB: 05-18-1946  Chief Complaint  Patient presents with   Medication Assistance    [name] is a 79 year old female with multiple medical conditions including but not limited to: asthma, metastatic lung cancer, atherosclerosis, diastolic heart failure, obesity, COPD, hypertension, hyperlipidemia, and type 2 diabetes.    They were referred to the pharmacist by their PCP for assistance in managing medication access. (Eliquis )  She was recently hospitalized and found to have a pulmonary emboli and was started on Eliquis . She was discharged on 01/04/24 and has not had any Eliquis  since then.     Subjective:  Care Team: Primary Care Provider: Jarold Medici, MD ;   Medication Access/Adherence  Current Pharmacy:  [facility] [id number] [address], [place] - [address] AT [facility] [address] & [facility] [place] [address] Phone: [phone] Fax: [phone]  [facility] [facility] [address], [address] [place] [address] Phone: [phone] Fax: [phone]       Assessment/Plan:   Spoke with [name], PharmD in the Cardiology clinic.  He was able to provide a Free Trial Coupon for the Patient which can be used at the [facility] at [address]    New prescription was sent to the Pharmacy Co-signature required with a note about the Free Trial Coupon .  Patient was called and told to pick up the prescription after her appointment.  Patient is seeing Dr. Jarold today at 4pm for a post hospital discharge appointment.  Follow Up Plan:    Follow up with Patient in 2 weeks.   [name], PharmD, BCACP Clinical Pharmacist [phone]    "

## 2025-01-10 NOTE — Telephone Encounter (Signed)
 Good morning, yes we have plenty of coupons. We keep them in the pharmacy downstairs. Would you be able to route the Rx to [facility] pharmacy?  I would do it but she is not a cardiology patient

## 2025-01-11 ENCOUNTER — Encounter: Payer: Self-pay | Admitting: Internal Medicine

## 2025-01-11 ENCOUNTER — Inpatient Hospital Stay: Admitting: Internal Medicine

## 2025-01-11 NOTE — Progress Notes (Signed)
 Complex Care Management Note Care Guide Note  01/11/2025 Name: [name] MRN: [id number] DOB: 06-22-1946   Complex Care Management Outreach Attempts: A second unsuccessful outreach was attempted today to offer the patient with information about available complex care management services.  Follow Up Plan:  Additional outreach attempts will be made to offer the patient complex care management information and services.   Encounter Outcome:  No Answer  Debbe Fuse [facility], [facility] Guide  Direct Dial: [phone]  Fax [phone]

## 2025-01-13 ENCOUNTER — Inpatient Hospital Stay (HOSPITAL_BASED_OUTPATIENT_CLINIC_OR_DEPARTMENT_OTHER): Admitting: Internal Medicine

## 2025-01-13 ENCOUNTER — Inpatient Hospital Stay: Attending: Internal Medicine

## 2025-01-13 ENCOUNTER — Telehealth: Payer: Self-pay | Admitting: *Deleted

## 2025-01-13 ENCOUNTER — Inpatient Hospital Stay

## 2025-01-13 VITALS — BP 120/67 | HR 118 | Temp 97.8°F | Resp 17 | Ht 65.0 in | Wt 154.0 lb

## 2025-01-13 DIAGNOSIS — C787 Secondary malignant neoplasm of liver and intrahepatic bile duct: Secondary | ICD-10-CM | POA: Insufficient documentation

## 2025-01-13 DIAGNOSIS — C7931 Secondary malignant neoplasm of brain: Secondary | ICD-10-CM | POA: Diagnosis not present

## 2025-01-13 DIAGNOSIS — C3491 Malignant neoplasm of unspecified part of right bronchus or lung: Secondary | ICD-10-CM

## 2025-01-13 DIAGNOSIS — Z5111 Encounter for antineoplastic chemotherapy: Secondary | ICD-10-CM | POA: Insufficient documentation

## 2025-01-13 DIAGNOSIS — C7889 Secondary malignant neoplasm of other digestive organs: Secondary | ICD-10-CM | POA: Insufficient documentation

## 2025-01-13 DIAGNOSIS — C7989 Secondary malignant neoplasm of other specified sites: Secondary | ICD-10-CM | POA: Diagnosis not present

## 2025-01-13 DIAGNOSIS — Z87891 Personal history of nicotine dependence: Secondary | ICD-10-CM | POA: Diagnosis not present

## 2025-01-13 DIAGNOSIS — Z5112 Encounter for antineoplastic immunotherapy: Secondary | ICD-10-CM | POA: Diagnosis present

## 2025-01-13 LAB — CBC WITH DIFFERENTIAL (CANCER CENTER ONLY)
Abs Immature Granulocytes: 0.04 K/uL (ref 0.00–0.07)
Basophils Absolute: 0.1 K/uL (ref 0.0–0.1)
Basophils Relative: 1 %
Eosinophils Absolute: 0.1 K/uL (ref 0.0–0.5)
Eosinophils Relative: 1 %
HCT: 31.9 % — ABNORMAL LOW (ref 36.0–46.0)
Hemoglobin: 10.5 g/dL — ABNORMAL LOW (ref 12.0–15.0)
Immature Granulocytes: 1 %
Lymphocytes Relative: 21 %
Lymphs Abs: 1.6 K/uL (ref 0.7–4.0)
MCH: 26.3 pg (ref 26.0–34.0)
MCHC: 32.9 g/dL (ref 30.0–36.0)
MCV: 79.8 fL — ABNORMAL LOW (ref 80.0–100.0)
Monocytes Absolute: 0.7 K/uL (ref 0.1–1.0)
Monocytes Relative: 9 %
Neutro Abs: 5.1 K/uL (ref 1.7–7.7)
Neutrophils Relative %: 67 %
Platelet Count: 500 K/uL — ABNORMAL HIGH (ref 150–400)
RBC: 4 MIL/uL (ref 3.87–5.11)
RDW: 21.4 % — ABNORMAL HIGH (ref 11.5–15.5)
WBC Count: 7.5 K/uL (ref 4.0–10.5)
nRBC: 0 % (ref 0.0–0.2)

## 2025-01-13 LAB — CMP (CANCER CENTER ONLY)
ALT: 18 U/L (ref 0–44)
AST: 33 U/L (ref 15–41)
Albumin: 4.1 g/dL (ref 3.5–5.0)
Alkaline Phosphatase: 61 U/L (ref 38–126)
Anion gap: 13 (ref 5–15)
BUN: 13 mg/dL (ref 8–23)
CO2: 25 mmol/L (ref 22–32)
Calcium: 9.6 mg/dL (ref 8.9–10.3)
Chloride: 100 mmol/L (ref 98–111)
Creatinine: 0.99 mg/dL (ref 0.44–1.00)
GFR, Estimated: 58 mL/min — ABNORMAL LOW
Glucose, Bld: 77 mg/dL (ref 70–99)
Potassium: 4.5 mmol/L (ref 3.5–5.1)
Sodium: 138 mmol/L (ref 135–145)
Total Bilirubin: 0.2 mg/dL (ref 0.0–1.2)
Total Protein: 7.8 g/dL (ref 6.5–8.1)

## 2025-01-13 LAB — SAMPLE TO BLOOD BANK

## 2025-01-13 MED ORDER — SODIUM CHLORIDE 0.9 % IV SOLN
500.0000 mg/m2 | Freq: Once | INTRAVENOUS | Status: AC
  Administered 2025-01-13: 900 mg via INTRAVENOUS
  Filled 2025-01-13: qty 20

## 2025-01-13 MED ORDER — APREPITANT 130 MG/18ML IV EMUL
130.0000 mg | Freq: Once | INTRAVENOUS | Status: AC
  Administered 2025-01-13: 130 mg via INTRAVENOUS
  Filled 2025-01-13: qty 18

## 2025-01-13 MED ORDER — DEXAMETHASONE SOD PHOSPHATE PF 10 MG/ML IJ SOLN
10.0000 mg | Freq: Once | INTRAMUSCULAR | Status: AC
  Administered 2025-01-13: 10 mg via INTRAVENOUS
  Filled 2025-01-13: qty 1

## 2025-01-13 MED ORDER — PALONOSETRON HCL INJECTION 0.25 MG/5ML
0.2500 mg | Freq: Once | INTRAVENOUS | Status: AC
  Administered 2025-01-13: 0.25 mg via INTRAVENOUS
  Filled 2025-01-13: qty 5

## 2025-01-13 MED ORDER — SODIUM CHLORIDE 0.9 % IV SOLN: INTRAVENOUS | Status: DC

## 2025-01-13 MED ORDER — SODIUM CHLORIDE 0.9 % IV SOLN
350.0000 mg | Freq: Once | INTRAVENOUS | Status: AC
  Administered 2025-01-13: 350 mg via INTRAVENOUS
  Filled 2025-01-13: qty 7

## 2025-01-13 MED ORDER — SODIUM CHLORIDE 0.9 % IV SOLN
413.5000 mg | Freq: Once | INTRAVENOUS | Status: AC
  Administered 2025-01-13: 410 mg via INTRAVENOUS
  Filled 2025-01-13: qty 41

## 2025-01-13 MED ORDER — SODIUM CHLORIDE 0.9% FLUSH: 10.0000 mL | INTRAVENOUS | Status: DC | PRN

## 2025-01-13 NOTE — Progress Notes (Signed)
 "     [facility] [phone]   [phone]  OFFICE PROGRESS NOTE  Jarold Medici, MD [address] [place] [address]  DIAGNOSIS: stage IV (TX, N2, M1 C) non-small cell lung cancer, adenocarcinoma presented with right hilar lymphadenopathy as well as left parotid gland metastasis in addition to liver, pancreatic tail and muscular metastasis diagnosed in October 2025.   Biomarker Findings Tumor Mutational Burden - 12 Muts/Mb HRD signature - HRDsig Negative Microsatellite status - MS-Stable Genomic Findings For a complete list of the genes assayed, please refer to the Appendix. KRAS G12C AKT2 amplification CCNE1 amplification PTEN R130* - subclonal MCL1 amplification TP53 V157F 7 Disease relevant genes with no reportable alterations: ALK, BRAF, EGFR, ERBB2, MET, RET, ROS1  PDL1 TPS 0% cMET Expression: 0%  PRIOR THERAPY: None  CURRENT THERAPY: Palliative systemic chemoimmunotherapy with carboplatin  for AUC of 5, Alimta  500 mg/M2 and Keytruda 200 mg IV every 3 weeks.  Status post 1 cycle.  First dose was given in November 29, 2024.  INTERVAL HISTORY: [name] 79 y.o. female returns to the clinic today for follow-up visit accompanied by her daughter.Discussed the use of AI scribe software for clinical note transcription with the patient, who gave verbal consent to proceed.  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  Diagnosed with stage IV lung adenocarcinoma in October 2025, with KRAS G12C mutation, negative PD-L1 and CMET. She is a non-smoker. Initiated palliative systemic chemoimmunotherapy with carboplatin , pemetrexed , and pembrolizumab on November 29, 2024, and has  completed one cycle, which was well tolerated without significant side effects.  She was recently hospitalized for acute onset delirium and stroke-like symptoms, including slurred speech and confusion, following poor oral intake and prolonged bed rest. MRI of the brain on December 29, 2024, showed interval growth of a right caudate nucleus nodule, extensive diffuse cerebral white matter disease, and a stable circumscribed peripheral enhancing mass along the left hemisphere. No acute stroke was identified. She received steroids for neurologic symptoms during hospitalization, which were discontinued prior to resuming immunotherapy. She is not currently taking steroids.  During the same hospitalization, she was diagnosed with a small segmental pulmonary embolism in the right lower lobe on CT angiogram, without right heart strain. She was started on apixaban  for anticoagulation.  Currently, she reports persistent fatigue, describing herself as dead tired all the time, and ongoing dizziness. She denies chest pain, shortness of breath, cough, abnormal bleeding, or bruising.      MEDICAL HISTORY: Past Medical History:  Diagnosis Date   Allergy  09/30/1996   Arthritis    Asthma    Complication of anesthesia    COPD (chronic obstructive pulmonary disease) (HCC)    Diabetes (HCC)    High cholesterol    History of radiation therapy    Right Lung- 12/04/21-12/11/21- Dr. Lynwood Nasuti   Hypertension    Lung cancer [facility])    Neuromuscular disorder (HCC)    Pneumonia 09/10/2017   PONV (postoperative nausea and vomiting)    Stroke (HCC) 2007   mini strokes    ALLERGIES:  is allergic to other.  MEDICATIONS:  Current Outpatient Medications  Medication Sig Dispense Refill   albuterol  (VENTOLIN  HFA) 108 (90 Base) MCG/ACT inhaler Inhale 2 puffs into the lungs every 4 (four) hours as needed  for wheezing or shortness of breath. 18 g 12   APIXABAN  (ELIQUIS ) VTE STARTER PACK (10MG  AND 5MG ) Take as directed  on package: start with two-5mg  tablets twice daily for 7 days. On day 8, switch to one-5mg  tablet twice daily. 74 each 0   arformoterol  (BROVANA ) 15 MCG/2ML NEBU Take 2 mLs (15 mcg total) by nebulization 2 (two) times daily. 120 mL 12   atorvastatin  (LIPITOR) 80 MG tablet TAKE 1 TABLET BY MOUTH EVERY DAY MONDAY THROUGH FRIDAY 90 tablet 1   Biotin 89999 MCG TBDP Take 10,000 mcg by mouth daily.     budesonide  (PULMICORT ) 0.5 MG/2ML nebulizer solution Take 2 mLs (0.5 mg total) by nebulization 2 (two) times daily. 120 mL 12   buPROPion  (WELLBUTRIN  XL) 150 MG 24 hr tablet Take 150 mg by mouth daily.     Calcium  Carbonate (CALCIUM  600 PO) Take 600 mg by mouth daily.     CELEBREX 200 MG capsule Take 200 mg by mouth daily.     cholecalciferol (VITAMIN D3) 25 MCG (1000 UNIT) tablet Take 1,000 Units by mouth daily.     DULoxetine  (CYMBALTA ) 60 MG capsule TAKE 1 CAPSULE(60 MG) BY MOUTH DAILY 90 capsule 1   famotidine  (PEPCID ) 20 MG tablet Take 1 tablet (20 mg total) by mouth daily.     folic acid  (FOLVITE ) 1 MG tablet Take 1 tablet (1 mg total) by mouth daily. Start 7 days before pemetrexed  chemotherapy. Continue until 21 days after pemetrexed  completed. 100 tablet 3   magnesium  gluconate (MAGONATE) 500 (27 Mg) MG TABS tablet Take 500 mg by mouth daily.     montelukast  (SINGULAIR ) 10 MG tablet TAKE 1 TABLET(10 MG) BY MOUTH AT BEDTIME 90 tablet 3   ondansetron  (ZOFRAN ) 8 MG tablet Take 1 tablet (8 mg total) by mouth every 8 (eight) hours as needed for nausea or vomiting. Start on the third day after carboplatin . 30 tablet 1   oxyCODONE -acetaminophen  (PERCOCET) 10-325 MG tablet Take 1 tablet by mouth every 6 (six) hours as needed for pain. 30 tablet 0   pantoprazole  (PROTONIX ) 40 MG tablet TAKE 1 TABLET BY MOUTH 30 MINUTES BEFORE FIRST MEAL OF THE DAY 90 tablet 2   prochlorperazine  (COMPAZINE ) 10 MG tablet Take 1 tablet (10 mg total) by mouth every 6 (six) hours as needed for nausea or vomiting. 30 tablet 1    REPATHA  SURECLICK 140 MG/ML SOAJ ADMINISTER 1 ML UNDER THE SKIN EVERY 14 DAYS 6 mL 3   Semaglutide , 1 MG/DOSE, (OZEMPIC , 1 MG/DOSE,) 4 MG/3ML SOPN Inject 1 mg into the skin once a week. Friday (Patient taking differently: Inject 0.5 mg into the skin once a week. Inject 0.5mg  weekly.) 3 mL 3   ipratropium-albuterol  (DUONEB) 0.5-2.5 (3) MG/3ML SOLN Take 3 mLs by nebulization every 4 (four) hours as needed. (Patient not taking: Reported on 01/10/2025) 360 mL 0   lidocaine -prilocaine  (EMLA ) cream Apply to affected area once (Patient not taking: Reported on 01/10/2025) 30 g 3   methocarbamol  (ROBAXIN ) 500 MG tablet Take 1 tablet (500 mg total) by mouth every 8 (eight) hours as needed for muscle spasms. (Patient not taking: Reported on 01/10/2025) 30 tablet 0   pregabalin  (LYRICA ) 100 MG capsule Take 1 capsule (100 mg total) by mouth 3 (three) times daily. TAKE 1 CAPSULE(100 MG) BY MOUTH THREE TIMES DAILY (Patient not taking: Reported on 01/10/2025) 90 capsule 2   No current facility-administered medications for this visit.    SURGICAL HISTORY:  Past Surgical History:  Procedure  Laterality Date   ABDOMINAL HYSTERECTOMY  1995   BRONCHIAL BIOPSY  10/23/2021   Procedure: BRONCHIAL BIOPSIES;  Surgeon: Brenna Adine CROME, DO;  Location: [facility];  Service: Pulmonary;;   BRONCHIAL BRUSHINGS  10/23/2021   Procedure: BRONCHIAL BRUSHINGS;  Surgeon: Brenna Adine CROME, DO;  Location: [facility];  Service: Pulmonary;;   BRONCHIAL NEEDLE ASPIRATION BIOPSY  10/23/2021   Procedure: BRONCHIAL NEEDLE ASPIRATION BIOPSIES;  Surgeon: Brenna Adine CROME, DO;  Location: [facility];  Service: Pulmonary;;   carpel tunnel surgery Right 2006   COLONOSCOPY  2014   COLOSTOMY  2007   colostomy let down  2008   endoscopy  2014   FIDUCIAL MARKER PLACEMENT  10/23/2021   Procedure: FIDUCIAL MARKER PLACEMENT;  Surgeon: Brenna Adine CROME, DO;  Location: [facility];  Service: Pulmonary;;   IR IMAGING GUIDED PORT INSERTION   12/03/2024   rotator cuff tear Left 04/14/2020   TUBAL LIGATION     VIDEO BRONCHOSCOPY WITH ENDOBRONCHIAL NAVIGATION Right 10/23/2021   Procedure: VIDEO BRONCHOSCOPY WITH ENDOBRONCHIAL NAVIGATION;  Surgeon: Brenna Adine CROME, DO;  Location: [facility];  Service: Pulmonary;  Laterality: Right;  ION, w/ fiducial placement   VIDEO BRONCHOSCOPY WITH RADIAL ENDOBRONCHIAL ULTRASOUND  10/23/2021   Procedure: VIDEO BRONCHOSCOPY WITH RADIAL ENDOBRONCHIAL ULTRASOUND;  Surgeon: Brenna Adine CROME, DO;  Location: [facility];  Service: Pulmonary;;    REVIEW OF SYSTEMS:  Constitutional: positive for fatigue Eyes: negative Ears, nose, mouth, throat, and face: negative Respiratory: negative Cardiovascular: negative Gastrointestinal: negative Genitourinary:negative Integument/breast: negative Hematologic/lymphatic: negative Musculoskeletal:negative Neurological: negative Behavioral/Psych: negative Endocrine: negative Allergic/Immunologic: negative   PHYSICAL EXAMINATION: General appearance: alert, cooperative, fatigued, and no distress Head: Normocephalic, without obvious abnormality, atraumatic Neck: no adenopathy, no JVD, supple, symmetrical, trachea midline, and thyroid  not enlarged, symmetric, no tenderness/mass/nodules Lymph nodes: Cervical, supraclavicular, and axillary nodes normal. Resp: clear to auscultation bilaterally Back: symmetric, no curvature. ROM normal. No CVA tenderness. Cardio: regular rate and rhythm, S1, S2 normal, no murmur, click, rub or gallop GI: soft, non-tender; bowel sounds normal; no masses,  no organomegaly Extremities: extremities normal, atraumatic, no cyanosis or edema Neurologic: Alert and oriented X 3, normal strength and tone. Normal symmetric reflexes. Normal coordination and gait  ECOG PERFORMANCE STATUS: 0 - Asymptomatic  Blood pressure 120/67, pulse (!) 118, temperature 97.8 F (36.6 C), temperature source Temporal, resp. rate 17, height 5' 5 (1.651 m),  weight 154 lb (69.9 kg), SpO2 96%.  LABORATORY DATA: Lab Results  Component Value Date   WBC 7.5 01/13/2025   HGB 10.5 (L) 01/13/2025   HCT 31.9 (L) 01/13/2025   MCV 79.8 (L) 01/13/2025   PLT 500 (H) 01/13/2025      Chemistry      Component Value Date/Time   NA 140 01/01/2025 0549   NA 139 05/31/2024 1240   K 4.2 01/01/2025 0549   CL 102 01/01/2025 0549   CO2 27 01/01/2025 0549   BUN 10 01/01/2025 0549   BUN 11 05/31/2024 1240   CREATININE 0.70 01/01/2025 0549   CREATININE 0.84 12/09/2024 0833   GLU 90 03/18/2022 0000      Component Value Date/Time   CALCIUM  9.4 01/01/2025 0549   ALKPHOS 57 12/31/2024 0128   AST 36 12/31/2024 0128   AST 28 12/09/2024 0833   ALT 17 12/31/2024 0128   ALT 13 12/09/2024 0833   BILITOT 0.2 12/31/2024 0128   BILITOT 0.3 12/09/2024 9166       RADIOGRAPHIC STUDIES: EEG adult Result Date: 12/29/2024 Shelton Arlin KIDD,  MD     12/29/2024  5:44 PM Patient Name: [name] MRN: [id number] Epilepsy Attending: Arlin MALVA Krebs Referring Physician/Provider: Michaela Aisha SQUIBB, MD Date: 12/29/2024 Duration: 22.47 mins Patient history: 79 y.o. female with a history of metastatic lung cancer, with a single brain metastasis who presents with confusion. EEG to evaluate for seizure Level of alertness: Awake AEDs during EEG study: PGB Technical aspects: This EEG study was done with scalp electrodes positioned according to the 10-20 International system of electrode placement. Electrical activity was reviewed with band pass filter of 1-70Hz , sensitivity of 7 uV/mm, display speed of 77mm/sec with a 60Hz  notched filter applied as appropriate. EEG data were recorded continuously and digitally stored.  Video monitoring was available and reviewed as appropriate. Description: The posterior dominant rhythm consists of 8Hz  activity of moderate voltage (25-35 uV) seen predominantly in posterior head regions, symmetric and reactive to eye opening and eye closing.  EEG showed intermittent generalized 3 to 6 Hz theta-delta slowing. Hyperventilation and photic stimulation were not performed.   ABNORMALITY - Intermittent slow, generalized IMPRESSION: This study is suggestive of generalized cerebral dysfunction (encephalopathy). No seizures or definite epileptiform discharges were seen throughout the recording.  Priyanka O Yadav   VAS US  LOWER EXTREMITY VENOUS (DVT) Result Date: 12/29/2024  Lower Venous DVT Study Patient Name:  EMMELYN SCHMALE  Date of Exam:   12/29/2024 Medical Rec #: [id number]         Accession #:    [id number] Date of Birth: 04/29/1946         Patient Gender: F Patient Age:   50 years Exam Location:  [facility] Procedure:      VAS US  LOWER EXTREMITY VENOUS (DVT) Referring Phys: A POWELL JR --------------------------------------------------------------------------------  Indications: Pulmonary embolism.  Risk Factors: Confirmed PE Cancer Lung. Limitations: Patient positioning. Comparison Study: None. Performing Technologist: Garnette Rockers  Examination Guidelines: A complete evaluation includes B-mode imaging, spectral Doppler, color Doppler, and power Doppler as needed of all accessible portions of each vessel. Bilateral testing is considered an integral part of a complete examination. Limited examinations for reoccurring indications may be performed as noted. The reflux portion of the exam is performed with the patient in reverse Trendelenburg.  +---------+---------------+---------+-----------+----------+-------------------+ RIGHT    CompressibilityPhasicitySpontaneityPropertiesThrombus Aging      +---------+---------------+---------+-----------+----------+-------------------+ CFV      Full           Yes      Yes                                      +---------+---------------+---------+-----------+----------+-------------------+ SFJ      Full                                                              +---------+---------------+---------+-----------+----------+-------------------+ FV Prox  Full                                                             +---------+---------------+---------+-----------+----------+-------------------+ FV Mid   Full  Yes      Yes                                      +---------+---------------+---------+-----------+----------+-------------------+ FV DistalFull                                                             +---------+---------------+---------+-----------+----------+-------------------+ PFV      Full                                                             +---------+---------------+---------+-----------+----------+-------------------+ POP      Full           Yes      Yes                                      +---------+---------------+---------+-----------+----------+-------------------+ PTV      Full                    Yes                  Not well visualized +---------+---------------+---------+-----------+----------+-------------------+ PERO                             Yes                  Not well visualized +---------+---------------+---------+-----------+----------+-------------------+   +---------+---------------+---------+-----------+----------+-------------------+ LEFT     CompressibilityPhasicitySpontaneityPropertiesThrombus Aging      +---------+---------------+---------+-----------+----------+-------------------+ CFV      Full           Yes      Yes                                      +---------+---------------+---------+-----------+----------+-------------------+ SFJ      Full                                                             +---------+---------------+---------+-----------+----------+-------------------+ FV Prox  Full                                                             +---------+---------------+---------+-----------+----------+-------------------+ FV  Mid   Full           Yes      Yes                                      +---------+---------------+---------+-----------+----------+-------------------+  FV DistalFull                                                             +---------+---------------+---------+-----------+----------+-------------------+ PFV      Full                                                             +---------+---------------+---------+-----------+----------+-------------------+ POP      Full           Yes      Yes                                      +---------+---------------+---------+-----------+----------+-------------------+ PTV      Full                    Yes                                      +---------+---------------+---------+-----------+----------+-------------------+ PERO                             Yes                  Not well visualized +---------+---------------+---------+-----------+----------+-------------------+     Summary: BILATERAL: - No evidence of deep vein thrombosis seen in the lower extremities, bilaterally. -No evidence of popliteal cyst, bilaterally.   *See table(s) above for measurements and observations. Electronically signed by Penne Colorado MD on 12/29/2024 at 5:02:34 PM.    Final    ECHOCARDIOGRAM COMPLETE Result Date: 12/29/2024    ECHOCARDIOGRAM REPORT   Patient Name:   MELENDA BIELAK Date of Exam: 12/29/2024 Medical Rec #:  [id number]        Height:       65.0 in Accession #:    [id number]       Weight:       165.1 lb Date of Birth:  11/28/1946        BSA:          1.823 m Patient Age:    78 years         BP:           103/54 mmHg Patient Gender: F                HR:           85 bpm. Exam Location:  Inpatient Procedure: 2D Echo, Pediatric Echo and Color Doppler (Both Spectral and Color            Flow Doppler were utilized during procedure). Indications:    Stroke  History:        Patient has prior history of Echocardiogram examinations, most                  recent 08/07/2021. COPD; Risk Factors:Hypertension, Diabetes and  Dyslipidemia.  Sonographer:    Sherlean Dubin Referring Phys: [phone] A CALDWELL POWELL JR IMPRESSIONS  1. Left ventricular ejection fraction, by estimation, is 55 to 60%. The left ventricle has normal function. The left ventricle has no regional wall motion abnormalities. Left ventricular diastolic parameters are consistent with Grade I diastolic dysfunction (impaired relaxation).  2. Right ventricular systolic function is normal. The right ventricular size is normal.  3. The mitral valve is normal in structure. No evidence of mitral valve regurgitation. No evidence of mitral stenosis.  4. The aortic valve is tricuspid. Aortic valve regurgitation is not visualized. No aortic stenosis is present.  5. The inferior vena cava is normal in size with greater than 50% respiratory variability, suggesting right atrial pressure of 3 mmHg. FINDINGS  Left Ventricle: Left ventricular ejection fraction, by estimation, is 55 to 60%. The left ventricle has normal function. The left ventricle has no regional wall motion abnormalities. The left ventricular internal cavity size was normal in size. There is  no left ventricular hypertrophy. Left ventricular diastolic parameters are consistent with Grade I diastolic dysfunction (impaired relaxation). Right Ventricle: The right ventricular size is normal. Right ventricular systolic function is normal. Left Atrium: Left atrial size was normal in size. Right Atrium: Right atrial size was normal in size. Pericardium: There is no evidence of pericardial effusion. Mitral Valve: The mitral valve is normal in structure. No evidence of mitral valve regurgitation. No evidence of mitral valve stenosis. Tricuspid Valve: The tricuspid valve is normal in structure. Tricuspid valve regurgitation is trivial. No evidence of tricuspid stenosis. Aortic Valve: The aortic valve is tricuspid. Aortic valve regurgitation is  not visualized. No aortic stenosis is present. Aortic valve peak gradient measures 3.0 mmHg. Pulmonic Valve: The pulmonic valve was normal in structure. Pulmonic valve regurgitation is not visualized. No evidence of pulmonic stenosis. Aorta: The aortic root is normal in size and structure. Venous: The inferior vena cava is normal in size with greater than 50% respiratory variability, suggesting right atrial pressure of 3 mmHg. IAS/Shunts: No atrial level shunt detected by color flow Doppler.  LEFT VENTRICLE PLAX 2D LVIDd:         4.00 cm   Diastology LVIDs:         2.70 cm   LV e' medial:    6.09 cm/s LV PW:         0.80 cm   LV E/e' medial:  9.3 LV IVS:        1.00 cm   LV e' lateral:   8.49 cm/s LVOT diam:     2.00 cm   LV E/e' lateral: 6.7 LV SV:         48 LV SV Index:   26 LVOT Area:     3.14 cm  RIGHT VENTRICLE             IVC RV Basal diam:  3.00 cm     IVC diam: 1.30 cm RV Mid diam:    2.40 cm RV S prime:     12.10 cm/s TAPSE (M-mode): 1.9 cm LEFT ATRIUM           Index        RIGHT ATRIUM           Index LA diam:      2.10 cm 1.15 cm/m   RA Area:     10.80 cm LA Vol (A2C): 23.2 ml 12.72 ml/m  RA Volume:   23.40 ml  12.83 ml/m LA Vol (  A4C): 23.6 ml 12.94 ml/m  AORTIC VALVE AV Area (Vmax): 3.04 cm AV Vmax:        86.65 cm/s AV Peak Grad:   3.0 mmHg LVOT Vmax:      83.95 cm/s LVOT Vmean:     58.100 cm/s LVOT VTI:       0.153 m  AORTA Ao Root diam: 2.60 cm MITRAL VALVE MV Area (PHT): 4.39 cm    SHUNTS MV Decel Time: 173 msec    Systemic VTI:  0.15 m MV E velocity: 56.60 cm/s  Systemic Diam: 2.00 cm MV A velocity: 80.50 cm/s MV E/A ratio:  0.70 Redell Shallow MD Electronically signed by Redell Shallow MD Signature Date/Time: 12/29/2024/1:30:37 PM    Final    MR BRAIN W WO CONTRAST Result Date: 12/29/2024 EXAM: MRI BRAIN WITH AND WITHOUT CONTRAST 12/28/2024 09:18:00 PM TECHNIQUE: Multiplanar multisequence MRI of the head/brain was performed with and without the administration of intravenous contrast.  CONTRAST: 7 mL of Gadobutrol . COMPARISON: MR Head 11/17/2024. CLINICAL HISTORY: Neuro deficit, acute, stroke suspected. FINDINGS: BRAIN AND VENTRICLES: A 3 to 4 mm nodule previously noted within the anterior body of the caudate nucleus on the right has increased to 4 to 5 mm in diameter and is concerning for a metastasis. There are no additional lesions within the brain demonstrated, but the study is degraded by patient motion. There is extensive diffuse cerebral white matter disease again demonstrated. No acute infarct. No acute intracranial hemorrhage. No mass effect or midline shift. No hydrocephalus. The sella is unremarkable. Normal flow voids. ORBITS: No acute abnormality. SINUSES: No acute abnormality. BONES AND SOFT TISSUES: Normal bone marrow signal and enhancement. A circumscribed, peripherally enhancing mass is again seen along the lateral surface of the left masseter, measuring approximately 2.0 x 1.8 x 2.3 cm, unchanged from the previous study. IMPRESSION: 1. Right caudate nucleus nodule has mildly increased in size, concerning for metastasis; no additional intracranial lesions are identified, although evaluation is limited by patient motion. 2. Extensive diffuse cerebral white matter disease. 3. Circumscribed, peripherally enhancing mass along the lateral surface of the left masseter, unchanged from the prior study. The mass remains concerning for primary or metastatic neoplasm. Electronically signed by: Evalene Coho MD 12/29/2024 04:40 AM EST RP Workstation: [id number]   CT Angio Chest PE W and/or Wo Contrast Result Date: 12/28/2024 EXAM: CTA CHEST 12/27/2024 11:35:46 PM TECHNIQUE: CTA of the chest was performed after the administration of intravenous contrast. Multiplanar reformatted images are provided for review. MIP images are provided for review. Automated exposure control, iterative reconstruction, and/or weight based adjustment of the mA/kV was utilized to reduce the radiation dose to  as low as reasonably achievable. COMPARISON: Chest CT 10/12/2024 CLINICAL HISTORY: Pulmonary embolism (PE) suspected, low to intermediate prob, positive D-dimer. FINDINGS: PULMONARY ARTERIES: Pulmonary arteries are adequately opacified for evaluation. There is a small segmental pulmonary embolus in the right lower lobe. No other pulmonary embolism. Main pulmonary artery is normal in caliber. MEDIASTINUM: Calcific aortic atherosclerosis and coronary artery calcification. The heart and pericardium demonstrate no acute abnormality. No CT evidence of right heart strain. Right chest wall port-a-cath terminates at the cavoatrial junction. Calcific aortic atherosclerosis is present. There is no acute aortic dissection, intramural hematoma, aortic ulceration, or rupture. LYMPH NODES: No mediastinal, hilar or axillary lymphadenopathy. LUNGS AND PLEURA: Unchanged region of scarring/fibrosis in the right upper lobe status post radiation. 4 mm right middle lobe perifissural nodule (. series 303 image 59). No focal consolidation or pulmonary edema. No  evidence of pleural effusion or pneumothorax. UPPER ABDOMEN: Limited images of the upper abdomen are unremarkable. SOFT TISSUES AND BONES: No acute bone or soft tissue abnormality. IMPRESSION: 1. Small segmental pulmonary embolus in the right lower lobe. No CT evidence of right heart strain or other pulmonary embolism. 2. Unchanged region of scarring/fibrosis in the right upper lobe status post radiation. Findings communicated to Dr. Wanita at 2:13 AM on 12/28/2024. Electronically signed by: Franky Stanford MD 12/28/2024 02:13 AM EST RP Workstation: [id number]   DG Chest Portable 1 View Result Date: 12/27/2024 CLINICAL DATA:  Altered mental status. EXAM: PORTABLE CHEST 1 VIEW COMPARISON:  06/28/2024, CT 10/12/2024 FINDINGS: Right chest port with tip overlying the SVC. The lungs are hyperinflated. Bronchial thickening. Periphery of the lung bases is excluded from the field of view.  The heart is normal in size. Bilateral hilar prominence likely due to enlarged pulmonary arteries on CT. Fiducial marker with the nodular density in the right upper lobe. No evidence of acute airspace disease. No pneumothorax or large pleural effusion. IMPRESSION: 1. Hyperinflation and bronchial thickening. No evidence of acute airspace disease. 2. Fiducial marker with nodular density in the right upper lobe. Electronically Signed   By: Andrea Gasman M.D.   On: 12/27/2024 18:41   CT ANGIO HEAD NECK W WO CM W PERF (CODE STROKE) Result Date: 12/27/2024 EXAM: CTA Head and Neck with Perfusion 12/27/2024 05:39:38 PM TECHNIQUE: CTA of the head and neck was performed without and with the administration of 100 mL of iohexol  (OMNIPAQUE ) 350 MG/ML injection. 3D postprocessing with multiplanar reconstructions and MIPs was performed to evaluate the vascular anatomy. Cerebral perfusion analysis using computed tomography with contrast administration, including post-processing of parametric maps with determination of cerebral blood flow, cerebral blood volume, mean transit time and time-to-maximum. Automated exposure control, iterative reconstruction, and/or weight based adjustment of the mA/kV was utilized to reduce the radiation dose to as low as reasonably achievable. COMPARISON: CT head dated 12/27/2024 CLINICAL HISTORY: Neuro deficit, acute, stroke suspected FINDINGS: CTA NECK: AORTIC ARCH AND ARCH VESSELS: Mild atherosclerosis of the partially visualized aortic arch. Atherosclerosis involves the aortic arch vessels without high grade stenosis. No dissection or arterial injury. No significant stenosis of the brachiocephalic or subclavian arteries. CERVICAL CAROTID ARTERIES: Calcified atherosclerosis at the right carotid bifurcation without hemodynamically significant stenosis. Calcification at the left carotid bifurcation without hemodynamically significant stenosis. No dissection or arterial injury. CERVICAL VERTEBRAL  ARTERIES: Atherosclerosis at the left vertebral artery origin resulting in moderate stenosis. Additional atherosclerosis at the right vertebral artery origin and along the right V1 segment resulting in mild stenosis. Atherosclerosis of the left V4 segment results in mild stenosis. No dissection or arterial injury. LUNGS AND MEDIASTINUM: There is abnormal soft tissue within the suprahilar aspect of the right upper lobe which is partially visualized and corresponds to findings from the CT chest on 10/12/2024. Scarring in the right upper lobe surrounds multiple upper lobe pulmonary artery branches. Treatment partially visualized right chest wall Port-A-Cath. SOFT TISSUES: There is a 1.9 x 1.8 cm mass involving the left masseter muscle adjacent to the anterior aspect of the left parotid gland corresponding to lesion seen on the MRI from 11/17/2024. Finding could reflect a metastatic lesion versus a primary parotid neoplasm. BONES: Degenerative changes in the visualized spine. Edentulous maxilla. CTA HEAD: ANTERIOR CIRCULATION: Atherosclerosis of the bilateral carotid siphons. There is mild stenosis of the bilateral cavernous and supraclinoid ICAs. No significant stenosis of the anterior cerebral arteries. No significant stenosis of the  middle cerebral arteries. No aneurysm. POSTERIOR CIRCULATION: No significant stenosis of the posterior cerebral arteries. No significant stenosis of the basilar artery. No significant stenosis of the vertebral arteries. No aneurysm. OTHER: No dural venous sinus thrombosis on this non-dedicated study. CT PERFUSION: EXAM QUALITY: The CT perfusion images are mildly degraded by motion artifact. Exam quality is adequate with diagnostic perfusion maps. Appropriate arterial inflow and venous outflow curves. CORE INFARCT (CBF<30% volume): 0 mL TOTAL HYPOPERFUSION (Tmax>6s volume): 7 mL PENUMBRA: Mismatch volume: 7 mL Mismatch ratio: Not applicable Location: These regions of elevated Tmax are  primarily within the inferior aspects of the temporal lobes adjacent to the skull base and are favored to be artifactual. IMPRESSION: 1. No acute large vessel occlusion. 2. No evidence of ischemia by CT brain perfusion. 3. Moderate stenosis at the left vertebral artery origin. Otherwise no high-grade stenosis or aneurysm in the head or neck vessels. 4. A 1.9 cm left masseter/parotid-adjacent mass, which could reflect metastatic disease versus a primary parotid neoplasm. Unchanged from recent MRI. 5. Impression #1-2 discussed with Dr. Matthews at 5:47 PM on 12/27/24. Electronically signed by: Donnice Mania MD 12/27/2024 06:04 PM EST RP Workstation: [id number]   CT HEAD CODE STROKE WO CONTRAST` Result Date: 12/27/2024 EXAM: CT HEAD WITHOUT 12/27/2024 05:15:52 PM TECHNIQUE: CT of the head was performed without the administration of intravenous contrast. Automated exposure control, iterative reconstruction, and/or weight based adjustment of the mA/kV was utilized to reduce the radiation dose to as low as reasonably achievable. COMPARISON: MRI head 11/17/2024. CLINICAL HISTORY: Neuro deficit, acute, stroke suspected. FINDINGS: BRAIN AND VENTRICLES: No acute intracranial hemorrhage. No mass effect or midline shift. No extra-axial fluid collection. No evidence of acute infarct. No hydrocephalus. Patchy white matter hypodensities, compatible with chronic microvascular ischemic disease. Basal ganglia calcifications. Calcifications of the dentate nuclei. Calcific atherosclerosis. Lesion in the anterior body of the right caudate nucleus seen on the prior MRI is not well evaluated on the current study. [place] Stroke Program Early CT (ASPECT) score: Ganglionic (caudate, internal capsule, lentiform nucleus, insula, M1-M3): 7 Supraganglionic (M4-M6): 3 Total: 10 ORBITS: No acute abnormality. SINUSES AND MASTOIDS: No acute abnormality. SOFT TISSUES AND SKULL: No acute skull fracture. No acute soft tissue abnormality. IMPRESSION:  1. No acute intracranial abnormality. 2. ASPECTS score is 10. 3. Lesion in the anterior body of the right caudate nucleus described on the prior MRI is not well evaluated on the current CT. 4. Findings messaged to Dr. Rosemarie via the [employer] messaging system at 5:27 PM on 12/27/24. Electronically signed by: Donnice Mania MD 12/27/2024 05:28 PM EST RP Workstation: [id number]    ASSESSMENT AND PLAN: This is a very pleasant 79 years old African-American female with stage IV (TX, N2, M1 C) non-small cell lung cancer, adenocarcinoma presented with right hilar lymphadenopathy as well as left parotid gland metastasis in addition to liver, pancreatic tail and muscular metastasis diagnosed in October 2025.  Molecular study showed positive KRAS G12C mutation and negative PD-L1 expression as well as negative c MET overexpression. She is currently under good palliative systemic chemoimmunotherapy with carboplatin  for AUC of 5, Alimta  500 mg/M2 and Keytruda 200 mg IV every 3 weeks status post 1 cycle.  First dose was November 29, 2024. She tolerated the first cycle of her treatment fairly well. 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 Recent hospitalization for acute onset delirium and stroke-like symptoms revealed interval growth of a  right caudate nucleus nodule concerning for metastasis, without evidence of acute cerebrovascular accident. She completed one cycle of palliative systemic chemoimmunotherapy (carboplatin , pemetrexed , pembrolizumab) with minimal side effects, reporting persistent fatigue and dizziness. She is appropriately not receiving corticosteroids due to ongoing immunotherapy. Laboratory values remain stable. Recent brain MRI reviewed; no immediate need for repeat imaging unless requested by neuro-oncology. - Reviewed recent brain MRI; no repeat imaging ordered at this time. - Will confirm  with neuro-oncology (Dr. Arnett team) if additional brain MRI is required per their protocol. - Scheduled follow-up in three weeks after third treatment cycle.  Pulmonary embolism Recent small segmental pulmonary embolism in the right lower lobe identified during hospitalization, without evidence of right heart strain. In the context of active lung cancer, extended anticoagulation is indicated. - Discussed need for prolonged anticoagulation due to active lung cancer. The patient was advised to call immediately if she has any other concerning symptoms in the interval.  The patient voices understanding of current disease status and treatment options and is in agreement with the current care plan.  All questions were answered. The patient knows to call the clinic with any problems, questions or concerns. We can certainly see the patient much sooner if necessary.  The total time spent in the appointment was 30 minutes including review of chart and various tests results, discussions about plan of care and coordination of care plan .   Disclaimer: This note was dictated with voice recognition software. Similar sounding words can inadvertently be transcribed and may not be corrected upon review.        "

## 2025-01-13 NOTE — Patient Instructions (Signed)
 [facility] - A DEPT OF [facility]  Discharge Instructions: Thank you for choosing [facility] to provide your oncology and hematology care.   If you have a lab appointment with the Cancer Center, please go directly to the Cancer Center and check in at the registration area.   Wear comfortable clothing and clothing appropriate for easy access to any Portacath or PICC line.   We strive to give you quality time with your provider. You may need to reschedule your appointment if you arrive late (15 or more minutes).  Arriving late affects you and other patients whose appointments are after yours.  Also, if you miss three or more appointments without notifying the office, you may be dismissed from the clinic at the providers discretion.      For prescription refill requests, have your pharmacy contact our office and allow 72 hours for refills to be completed.    Today you received the following chemotherapy and/or immunotherapy agents alimta  carboplatin  libtayo       To help prevent nausea and vomiting after your treatment, we encourage you to take your nausea medication as directed.  BELOW ARE SYMPTOMS THAT SHOULD BE REPORTED IMMEDIATELY: *FEVER GREATER THAN 100.4 F (38 C) OR HIGHER *CHILLS OR SWEATING *NAUSEA AND VOMITING THAT IS NOT CONTROLLED WITH YOUR NAUSEA MEDICATION *UNUSUAL SHORTNESS OF BREATH *UNUSUAL BRUISING OR BLEEDING *URINARY PROBLEMS (pain or burning when urinating, or frequent urination) *BOWEL PROBLEMS (unusual diarrhea, constipation, pain near the anus) TENDERNESS IN MOUTH AND THROAT WITH OR WITHOUT PRESENCE OF ULCERS (sore throat, sores in mouth, or a toothache) UNUSUAL RASH, SWELLING OR PAIN  UNUSUAL VAGINAL DISCHARGE OR ITCHING   Items with * indicate a potential emergency and should be followed up as soon as possible or go to the Emergency Department if any problems should occur.  Please show the CHEMOTHERAPY ALERT CARD  or IMMUNOTHERAPY ALERT CARD at check-in to the Emergency Department and triage nurse.  Should you have questions after your visit or need to cancel or reschedule your appointment, please contact [facility] - A DEPT OF [facility]  Dept: [phone]  and follow the prompts.  Office hours are 8:00 a.m. to 4:30 p.m. Monday - Friday. Please note that voicemails left after 4:00 p.m. may not be returned until the following business day.  We are closed weekends and major holidays. You have access to a nurse at all times for urgent questions. Please call the main number to the clinic Dept: [phone] and follow the prompts.   For any non-urgent questions, you may also contact your provider using MyChart. We now offer e-Visits for anyone 1 and older to request care online for non-urgent symptoms. For details visit [email or web address].   Also download the MyChart app! Go to the app store, search MyChart, open the app, select [place], and log in with your MyChart username and password.

## 2025-01-13 NOTE — Telephone Encounter (Signed)
 PC to patient, no answer, left VM - informed patient her brain MRI has been changed to 02/11/25 at [facility] at 2:10.  Her appointment with Dr Buckley has been changed to 02/15/25 at 9:30.  Patient instructed to contact this office if these appointments need to be changed.

## 2025-01-14 ENCOUNTER — Other Ambulatory Visit

## 2025-01-16 NOTE — Progress Notes (Incomplete)
 I,[name], CMA,acting as a neurosurgeon for [name], MD.,have documented all relevant documentation on the behalf of [name], MD,as directed by  [name], MD while in the presence of [name], MD.  Subjective:  Patient ID: [name] , female    DOB: [birth year] , 79 y.o.   MRN: [id number]  Chief Complaint  Patient presents with   Hospitalization Follow-up    Patient presents today for hospital follow up. Admitted to [facility] on 12/29 & discharged on 01/03/25. Today she reports feeling  yuck she feels she has ran [employer]. Her body aches, feeling tired.  Denies fever/ chills.     HPI Discussed the use of AI scribe software for clinical note transcription with the patient, who gave verbal consent to proceed.  History of Present Illness   Discussed the use of AI scribe software for clinical note transcription with the patient, who gave verbal consent to proceed.  History of Present Illness   HPI   Past Medical History:  Diagnosis Date   Allergy  09/30/1996   Arthritis    Asthma    Complication of anesthesia    COPD (chronic obstructive pulmonary disease) (HCC)    Diabetes (HCC)    High cholesterol    History of radiation therapy    Right Lung- 12/04/21-12/11/21- Dr. Lynwood Nasuti   Hypertension    Lung cancer [facility])    Neuromuscular disorder (HCC)    Pneumonia 09/10/2017   PONV (postoperative nausea and vomiting)    Stroke (HCC) 2007   mini strokes     Family History  Problem Relation Age of Onset   Heart murmur Mother    Heart attack Father    Diabetes Father    Diabetes Brother    Stroke Brother    Vision loss Brother    Vision loss Brother     Current Medications[1]   Allergies[2]   Review of Systems  Constitutional: Negative.   Respiratory: Negative.    Cardiovascular: Negative.   Gastrointestinal: Negative.   Neurological: Negative.   Psychiatric/Behavioral: Negative.       Today's  Vitals   01/10/25 1613  BP: 100/80  Pulse: 100  Temp: 98.3 F (36.8 C)  SpO2: 94%  Weight: 154 lb 9.6 oz (70.1 kg)  Height: 5' 5 (1.651 m)   Body mass index is 25.73 kg/m.  Wt Readings from Last 3 Encounters:  01/10/25 154 lb 9.6 oz (70.1 kg)  12/28/24 165 lb 2 oz (74.9 kg)  12/15/24 165 lb 2 oz (74.9 kg)    The ASCVD Risk score (Arnett DK, et al., 2019) failed to calculate for the following reasons:   Risk score cannot be calculated because patient has a medical history suggesting prior/existing ASCVD   * - Cholesterol units were assumed  Objective:  Physical Exam Vitals and nursing note reviewed.  Constitutional:      Appearance: Normal appearance.  HENT:     Head: Normocephalic and atraumatic.  Eyes:     Extraocular Movements: Extraocular movements intact.  Cardiovascular:     Rate and Rhythm: Normal rate and regular rhythm.     Heart sounds: Normal heart sounds.  Pulmonary:     Effort: Pulmonary effort is normal.     Breath sounds: Normal breath sounds.  Musculoskeletal:     Cervical back: Normal range of motion.  Skin:    General: Skin is warm.  Neurological:     General: No focal deficit present.  Mental Status: She is alert.  Psychiatric:        Mood and Affect: Mood normal.        Behavior: Behavior normal.         Assessment And Plan:   Assessment & Plan Aphasia TCM PERFORMED. A MEMBER OF THE CLINICAL TEAM SPOKE WITH THE PATIENT UPON DISCHARGE. DISCHARGE SUMMARY WAS REVIEWED IN FULL DETAIL DURING THE VISIT. MEDS RECONCILED AND COMPARED TO DISCHARGE MEDS. MEDICATION LIST WAS UPDATED AND REVIEWED WITH THE PATIENT. GREATER THAN 50% FACE TO FACE TIME WAS SPENT IN COUNSELING AND COORDINATION OF CARE. ALL QUESTIONS WERE ANSWERED TO THE SATISFACTION OF THE PATIENT.   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    Return if symptoms worsen or fail to improve.  Patient was given opportunity to ask questions. Patient verbalized understanding of the plan and was able to repeat key elements of the plan. All questions were answered to their satisfaction.    I, [name], MD, have reviewed all documentation for this visit. The documentation on 01/10/2025 for the exam, diagnosis, procedures, and orders are all accurate and complete.   IF YOU HAVE BEEN REFERRED TO A SPECIALIST, IT MAY TAKE 1-2 WEEKS TO SCHEDULE/PROCESS THE REFERRAL. IF YOU HAVE NOT HEARD FROM US /SPECIALIST IN TWO WEEKS, PLEASE GIVE US  A CALL AT [phone] X [phone].        [1]  Current Outpatient Medications:    albuterol  (VENTOLIN  HFA) 108 (90 Base) MCG/ACT inhaler, Inhale 2 puffs into the lungs every 4 (four) hours as needed for wheezing or shortness of breath., Disp: 18 g, Rfl: 12   APIXABAN  (ELIQUIS ) VTE STARTER PACK (10MG  AND 5MG ), Take as directed on package: start with two-5mg  tablets twice daily for 7 days. On day 8, switch to one-5mg  tablet twice daily., Disp: 74 each, Rfl: 0   arformoterol  (BROVANA ) 15 MCG/2ML NEBU, Take 2 mLs (15 mcg total) by nebulization 2 (two) times daily., Disp: 120 mL, Rfl: 12   atorvastatin  (LIPITOR) 80 MG tablet, TAKE 1 TABLET BY MOUTH EVERY DAY MONDAY THROUGH FRIDAY, Disp: 90 tablet, Rfl: 1   Biotin 10000 MCG TBDP, Take 10,000 mcg by mouth daily., Disp: , Rfl:    budesonide  (PULMICORT ) 0.5 MG/2ML nebulizer solution, Take 2 mLs (0.5 mg total) by nebulization 2 (two) times daily., Disp: 120 mL, Rfl: 12   buPROPion  (WELLBUTRIN  XL) 150 MG 24 hr tablet, Take 150 mg by mouth daily., Disp: ,  Rfl:    Calcium  Carbonate (CALCIUM  600 PO), Take 600 mg by mouth daily., Disp: , Rfl:    CELEBREX 200 MG capsule, Take 200 mg by mouth daily., Disp: , Rfl:    cholecalciferol (VITAMIN D3) 25 MCG (1000 UNIT) tablet, Take 1,000 Units by mouth daily., Disp: , Rfl:    DULoxetine  (CYMBALTA ) 60 MG capsule, TAKE 1 CAPSULE(60 MG) BY MOUTH DAILY, Disp: 90 capsule, Rfl: 1   famotidine  (PEPCID ) 20 MG tablet, Take 1 tablet (20 mg total) by mouth daily., Disp: , Rfl:    folic acid  (FOLVITE ) 1 MG tablet, Take 1 tablet (1 mg total) by  mouth daily. Start 7 days before pemetrexed  chemotherapy. Continue until 21 days after pemetrexed  completed., Disp: 100 tablet, Rfl: 3   magnesium  gluconate (MAGONATE) 500 (27 Mg) MG TABS tablet, Take 500 mg by mouth daily., Disp: , Rfl:    montelukast  (SINGULAIR ) 10 MG tablet, TAKE 1 TABLET(10 MG) BY MOUTH AT BEDTIME, Disp: 90 tablet, Rfl: 3   ondansetron  (ZOFRAN ) 8 MG tablet, Take 1 tablet (8 mg total) by mouth every 8 (eight) hours as needed for nausea or vomiting. Start on the third day after carboplatin ., Disp: 30 tablet, Rfl: 1   oxyCODONE -acetaminophen  (PERCOCET) 10-325 MG tablet, Take 1 tablet by mouth every 6 (six) hours as needed for pain., Disp: 30 tablet, Rfl: 0   pantoprazole  (PROTONIX ) 40 MG tablet, TAKE 1 TABLET BY MOUTH 30 MINUTES BEFORE FIRST MEAL OF THE DAY, Disp: 90 tablet, Rfl: 2   prochlorperazine  (COMPAZINE ) 10 MG tablet, Take 1 tablet (10 mg total) by mouth every 6 (six) hours as needed for nausea or vomiting., Disp: 30 tablet, Rfl: 1   REPATHA  SURECLICK 140 MG/ML SOAJ, ADMINISTER 1 ML UNDER THE SKIN EVERY 14 DAYS, Disp: 6 mL, Rfl: 3   Semaglutide , 1 MG/DOSE, (OZEMPIC , 1 MG/DOSE,) 4 MG/3ML SOPN, Inject 1 mg into the skin once a week. Friday, Disp: 3 mL, Rfl: 3   ipratropium-albuterol  (DUONEB) 0.5-2.5 (3) MG/3ML SOLN, Take 3 mLs by nebulization every 4 (four) hours as needed. (Patient not taking: Reported on 01/10/2025), Disp: 360 mL, Rfl: 0    lidocaine -prilocaine  (EMLA ) cream, Apply to affected area once (Patient not taking: Reported on 01/10/2025), Disp: 30 g, Rfl: 3   methocarbamol  (ROBAXIN ) 500 MG tablet, Take 1 tablet (500 mg total) by mouth every 8 (eight) hours as needed for muscle spasms. (Patient not taking: Reported on 01/10/2025), Disp: 30 tablet, Rfl: 0   pregabalin  (LYRICA ) 100 MG capsule, Take 1 capsule (100 mg total) by mouth 3 (three) times daily. TAKE 1 CAPSULE(100 MG) BY MOUTH THREE TIMES DAILY (Patient not taking: Reported on 01/10/2025), Disp: 90 capsule, Rfl: 2 [2] Allergies Allergen Reactions   Other Swelling    HAIR DYE

## 2025-01-17 ENCOUNTER — Other Ambulatory Visit: Payer: Self-pay | Admitting: Physician Assistant

## 2025-01-17 DIAGNOSIS — C3491 Malignant neoplasm of unspecified part of right bronchus or lung: Secondary | ICD-10-CM

## 2025-01-17 NOTE — Assessment & Plan Note (Signed)
 Symptoms likely due to metastasis, not ischemic event. - Continue follow-up with oncologist for further evaluation and management.

## 2025-01-17 NOTE — Assessment & Plan Note (Signed)
 MRI showed nodule increase, possibly metastatic. Chemotherapy not yet started. - Continue follow-up with oncologist for chemotherapy management.

## 2025-01-17 NOTE — Assessment & Plan Note (Signed)
 TCM PERFORMED. A MEMBER OF THE CLINICAL TEAM SPOKE WITH THE PATIENT UPON DISCHARGE. DISCHARGE SUMMARY WAS REVIEWED IN FULL DETAIL DURING THE VISIT. MEDS RECONCILED AND COMPARED TO DISCHARGE MEDS. MEDICATION LIST WAS UPDATED AND REVIEWED WITH THE PATIENT. GREATER THAN 50% FACE TO FACE TIME WAS SPENT IN COUNSELING AND COORDINATION OF CARE. ALL QUESTIONS WERE ANSWERED TO THE SATISFACTION OF THE PATIENT.  - She has not had any recurrence of symptoms since discharge.

## 2025-01-17 NOTE — Assessment & Plan Note (Signed)
 Chronic, followed by Pulmonary. Recent exacerbation, did not require oxygen at discharge.

## 2025-01-17 NOTE — Assessment & Plan Note (Signed)
 Chronic, as stated above, will decrease Ozempic  to 0.5mg  weekly.

## 2025-01-17 NOTE — Assessment & Plan Note (Signed)
 Chronic, well controlled. NO med changes.  - Follow low sodium diet.

## 2025-01-20 ENCOUNTER — Inpatient Hospital Stay: Admitting: Internal Medicine

## 2025-01-21 NOTE — Progress Notes (Signed)
 Complex Care Management Note Care Guide Note  01/21/2025 Name: [name] MRN: [id number] DOB: 01/24/1946   Complex Care Management Outreach Attempts: A third unsuccessful outreach was attempted today to offer the patient with information about available complex care management services.  Follow Up Plan:  No further outreach attempts will be made at this time. We have been unable to contact the patient to offer or enroll patient in complex care management services.  Encounter Outcome:  No Answer  Debbe Fuse [facility], [facility] Guide  Direct Dial: [phone]  Fax [phone]

## 2025-01-26 ENCOUNTER — Telehealth: Payer: Self-pay | Admitting: Pharmacist

## 2025-01-26 ENCOUNTER — Encounter: Payer: Self-pay | Admitting: Internal Medicine

## 2025-01-26 ENCOUNTER — Inpatient Hospital Stay

## 2025-01-26 DIAGNOSIS — D508 Other iron deficiency anemias: Secondary | ICD-10-CM

## 2025-01-26 DIAGNOSIS — Z5112 Encounter for antineoplastic immunotherapy: Secondary | ICD-10-CM | POA: Diagnosis not present

## 2025-01-26 DIAGNOSIS — Z79899 Other long term (current) drug therapy: Secondary | ICD-10-CM

## 2025-01-26 DIAGNOSIS — C3491 Malignant neoplasm of unspecified part of right bronchus or lung: Secondary | ICD-10-CM

## 2025-01-26 LAB — CMP (CANCER CENTER ONLY)
ALT: 20 U/L (ref 0–44)
AST: 34 U/L (ref 15–41)
Albumin: 4 g/dL (ref 3.5–5.0)
Alkaline Phosphatase: 73 U/L (ref 38–126)
Anion gap: 10 (ref 5–15)
BUN: 8 mg/dL (ref 8–23)
CO2: 27 mmol/L (ref 22–32)
Calcium: 9.3 mg/dL (ref 8.9–10.3)
Chloride: 105 mmol/L (ref 98–111)
Creatinine: 0.72 mg/dL (ref 0.44–1.00)
GFR, Estimated: >60 mL/min
Glucose, Bld: 92 mg/dL (ref 70–99)
Potassium: 4.4 mmol/L (ref 3.5–5.1)
Sodium: 141 mmol/L (ref 135–145)
Total Bilirubin: 0.2 mg/dL (ref 0.0–1.2)
Total Protein: 7.2 g/dL (ref 6.5–8.1)

## 2025-01-26 LAB — CBC WITH DIFFERENTIAL (CANCER CENTER ONLY)
Abs Immature Granulocytes: 0 10*3/uL (ref 0.00–0.07)
Basophils Absolute: 0 10*3/uL (ref 0.0–0.1)
Basophils Relative: 0 %
Eosinophils Absolute: 0.1 10*3/uL (ref 0.0–0.5)
Eosinophils Relative: 5 %
HCT: 27.3 % — ABNORMAL LOW (ref 36.0–46.0)
Hemoglobin: 9 g/dL — ABNORMAL LOW (ref 12.0–15.0)
Immature Granulocytes: 0 %
Lymphocytes Relative: 45 %
Lymphs Abs: 1.2 10*3/uL (ref 0.7–4.0)
MCH: 26.7 pg (ref 26.0–34.0)
MCHC: 33 g/dL (ref 30.0–36.0)
MCV: 81 fL (ref 80.0–100.0)
Monocytes Absolute: 0.3 10*3/uL (ref 0.1–1.0)
Monocytes Relative: 12 %
Neutro Abs: 1 10*3/uL — ABNORMAL LOW (ref 1.7–7.7)
Neutrophils Relative %: 38 %
Platelet Count: 120 10*3/uL — ABNORMAL LOW (ref 150–400)
RBC: 3.37 MIL/uL — ABNORMAL LOW (ref 3.87–5.11)
RDW: 21.7 % — ABNORMAL HIGH (ref 11.5–15.5)
WBC Count: 2.6 10*3/uL — ABNORMAL LOW (ref 4.0–10.5)
nRBC: 0 % (ref 0.0–0.2)

## 2025-01-26 LAB — SAMPLE TO BLOOD BANK

## 2025-01-26 NOTE — Progress Notes (Signed)
" ° °  01/26/2025 Name: [name] MRN: [id number] DOB: Jun 13, 1946  Patient was called regarding Eliquis . She was referred for help with Patient Assistance Programs. Unfortunately, to qualify to get Eliquis  thorough [employer]'s program, Patient's have to spend at least 3% of their income in medication expenses.  Patient was given a one time free trial card to get Eliquis  filled at [facility] on [address]. It was filled 01/10/25.  Patient has a Health Visitor through [place]. She has a $250 deductible on Medications tier 3 and above. After the deductible is met, tier 3 medications have a $47 copay.   Purpose of call today was to follow up. Unfortunately, she did not answer her phone. HIPAA compliant message was left on her voicemail.  Cassius DOROTHA Brought, PharmD, BCACP Clinical Pharmacist [phone]      "

## 2025-01-27 ENCOUNTER — Other Ambulatory Visit: Payer: Self-pay | Admitting: Internal Medicine

## 2025-01-27 DIAGNOSIS — C3491 Malignant neoplasm of unspecified part of right bronchus or lung: Secondary | ICD-10-CM

## 2025-01-28 ENCOUNTER — Telehealth: Payer: Self-pay | Admitting: Pharmacist

## 2025-01-28 ENCOUNTER — Inpatient Hospital Stay

## 2025-01-28 ENCOUNTER — Inpatient Hospital Stay: Admitting: Physician Assistant

## 2025-01-28 ENCOUNTER — Telehealth: Payer: Self-pay

## 2025-01-28 DIAGNOSIS — Z79899 Other long term (current) drug therapy: Secondary | ICD-10-CM

## 2025-01-28 NOTE — Telephone Encounter (Signed)
" °  Spoke with patient regarding recurrent epistaxis occurring over the past several days. Patient reports nosebleeds typically occur in the morning upon awakening. Denies epistaxis today. Patient expressed concern related to current Eliquis  use. Reports bleeding has been controlled with manual pressure applied to the nose. Patient instructed to continue monitoring symptoms and to notify MD if nosebleeds recur, worsen, or become difficult to control. Patient verbalized understanding. "

## 2025-01-28 NOTE — Progress Notes (Signed)
" ° °  01/28/2025 Name: [name] MRN: [id number] DOB: 02-13-1946  Chief Complaint  Patient presents with   Medication Management   Medication Assistance    Eliquis     [name] is a 79 y.o. year old female who presented for a telephone visit.   They were referred to the pharmacist by their PCP for assistance in managing medication access. (Eliquis )  Patient was called to remind her about her $250 deductible for Eliquis  for her next refill.  She was given a free trial coupon to get a 1 month supply of Eliquis . She said she has about 2 weeks of therapy left.  She was encouraged to call [employer] to set up a payment plan to help with future copayments.'  Patient communicated understanding.  [name], PharmD, BCACP Clinical Pharmacist [phone]      "

## 2025-01-31 ENCOUNTER — Encounter: Payer: Self-pay | Admitting: Internal Medicine

## 2025-02-03 ENCOUNTER — Ambulatory Visit (HOSPITAL_COMMUNITY)
Admission: RE | Admit: 2025-02-03 | Discharge: 2025-02-03 | Disposition: A | Source: Ambulatory Visit | Attending: Physician Assistant

## 2025-02-03 ENCOUNTER — Inpatient Hospital Stay: Attending: Internal Medicine | Admitting: Physician Assistant

## 2025-02-03 ENCOUNTER — Telehealth: Payer: Self-pay

## 2025-02-03 ENCOUNTER — Inpatient Hospital Stay: Admitting: Dietician

## 2025-02-03 ENCOUNTER — Inpatient Hospital Stay

## 2025-02-03 ENCOUNTER — Ambulatory Visit: Payer: Self-pay | Admitting: Family Medicine

## 2025-02-03 ENCOUNTER — Encounter: Payer: Self-pay | Admitting: Internal Medicine

## 2025-02-03 ENCOUNTER — Inpatient Hospital Stay: Attending: Internal Medicine

## 2025-02-03 ENCOUNTER — Ambulatory Visit: Admission: RE | Admit: 2025-02-03 | Discharge: 2025-02-03 | Disposition: A | Source: Ambulatory Visit

## 2025-02-03 ENCOUNTER — Ambulatory Visit

## 2025-02-03 VITALS — BP 132/53 | HR 103 | Temp 98.1°F | Resp 18 | Wt 163.0 lb

## 2025-02-03 VITALS — BP 110/60 | HR 86 | Temp 96.3°F | Ht 65.0 in | Wt 163.0 lb

## 2025-02-03 DIAGNOSIS — R053 Chronic cough: Secondary | ICD-10-CM

## 2025-02-03 DIAGNOSIS — J3489 Other specified disorders of nose and nasal sinuses: Secondary | ICD-10-CM

## 2025-02-03 DIAGNOSIS — R059 Cough, unspecified: Secondary | ICD-10-CM

## 2025-02-03 DIAGNOSIS — C3491 Malignant neoplasm of unspecified part of right bronchus or lung: Secondary | ICD-10-CM

## 2025-02-03 DIAGNOSIS — M25551 Pain in right hip: Secondary | ICD-10-CM

## 2025-02-03 DIAGNOSIS — M79651 Pain in right thigh: Secondary | ICD-10-CM

## 2025-02-03 DIAGNOSIS — R062 Wheezing: Secondary | ICD-10-CM

## 2025-02-03 DIAGNOSIS — D649 Anemia, unspecified: Secondary | ICD-10-CM

## 2025-02-03 LAB — CMP (CANCER CENTER ONLY)
ALT: 18 U/L (ref 0–44)
AST: 35 U/L (ref 15–41)
Albumin: 3.6 g/dL (ref 3.5–5.0)
Alkaline Phosphatase: 82 U/L (ref 38–126)
Anion gap: 11 (ref 5–15)
BUN: 8 mg/dL (ref 8–23)
CO2: 26 mmol/L (ref 22–32)
Calcium: 9.1 mg/dL (ref 8.9–10.3)
Chloride: 103 mmol/L (ref 98–111)
Creatinine: 0.79 mg/dL (ref 0.44–1.00)
GFR, Estimated: >60 mL/min
Glucose, Bld: 133 mg/dL — ABNORMAL HIGH (ref 70–99)
Potassium: 4 mmol/L (ref 3.5–5.1)
Sodium: 140 mmol/L (ref 135–145)
Total Bilirubin: 0.3 mg/dL (ref 0.0–1.2)
Total Protein: 7 g/dL (ref 6.5–8.1)

## 2025-02-03 LAB — CBC WITH DIFFERENTIAL (CANCER CENTER ONLY)
Abs Immature Granulocytes: 0.01 10*3/uL (ref 0.00–0.07)
Basophils Absolute: 0 10*3/uL (ref 0.0–0.1)
Basophils Relative: 1 %
Eosinophils Absolute: 0.1 10*3/uL (ref 0.0–0.5)
Eosinophils Relative: 2 %
HCT: 27.7 % — ABNORMAL LOW (ref 36.0–46.0)
Hemoglobin: 9.1 g/dL — ABNORMAL LOW (ref 12.0–15.0)
Immature Granulocytes: 0 %
Lymphocytes Relative: 28 %
Lymphs Abs: 0.9 10*3/uL (ref 0.7–4.0)
MCH: 26.8 pg (ref 26.0–34.0)
MCHC: 32.9 g/dL (ref 30.0–36.0)
MCV: 81.7 fL (ref 80.0–100.0)
Monocytes Absolute: 0.5 10*3/uL (ref 0.1–1.0)
Monocytes Relative: 15 %
Neutro Abs: 1.7 10*3/uL (ref 1.7–7.7)
Neutrophils Relative %: 54 %
Platelet Count: 333 10*3/uL (ref 150–400)
RBC: 3.39 MIL/uL — ABNORMAL LOW (ref 3.87–5.11)
RDW: 24.1 % — ABNORMAL HIGH (ref 11.5–15.5)
WBC Count: 3.2 10*3/uL — ABNORMAL LOW (ref 4.0–10.5)
nRBC: 0 % (ref 0.0–0.2)

## 2025-02-03 LAB — SAMPLE TO BLOOD BANK

## 2025-02-03 LAB — TSH: TSH: 0.975 u[IU]/mL (ref 0.350–4.500)

## 2025-02-03 MED ORDER — DOXYCYCLINE HYCLATE 100 MG PO TABS: 100.0000 mg | ORAL_TABLET | Freq: Two times a day (BID) | ORAL | 0 refills | Status: AC

## 2025-02-03 NOTE — Progress Notes (Unsigned)
 I,Jameka J Llittleton, CMA,acting as a neurosurgeon for Tammy JONELLE Fischer, DO.,have documented all relevant documentation on the behalf of Tammy JONELLE Fischer, DO,as directed by  Tammy JONELLE Fischer, DO while in the presence of Tammy JONELLE Fischer, DO.  Subjective:  Patient ID: [name] , female    DOB: 1946/05/22 , 79 y.o.   MRN: [id number]  Chief Complaint  Patient presents with   Leg Pain    Patient presents today for tigh pain. She reports the pain started a couple weeks ago. She feels like someone is shooting her with fire in her thigh. She reports the pain makes her jump out of her sleep.    HPI  HPI   Past Medical History:  Diagnosis Date   Allergy  09/30/1996   Arthritis    Asthma    Complication of anesthesia    COPD (chronic obstructive pulmonary disease) (HCC)    Diabetes (HCC)    High cholesterol    History of radiation therapy    Right Lung- 12/04/21-12/11/21- Dr. Lynwood Nasuti   Hypertension    Lung cancer [facility])    Neuromuscular disorder (HCC)    Pneumonia 09/10/2017   PONV (postoperative nausea and vomiting)    Stroke (HCC) 2007   mini strokes     Family History  Problem Relation Age of Onset   Heart murmur Mother    Heart attack Father    Diabetes Father    Diabetes Brother    Stroke Brother    Vision loss Brother    Vision loss Brother     Current Medications[1]   Allergies[2]   Review of Systems   Today's Vitals   02/03/25 1147  BP: 110/60  Pulse: 86  Temp: (!) 96.3 F (35.7 C)  TempSrc: Oral  Weight: 163 lb (73.9 kg)  Height: 5' 5 (1.651 m)  PainSc: 7   PainLoc: Leg   Body mass index is 27.12 kg/m.  Wt Readings from Last 3 Encounters:  02/03/25 163 lb (73.9 kg)  02/03/25 163 lb (73.9 kg)  01/13/25 154 lb (69.9 kg)    The ASCVD Risk score (Arnett DK, et al., 2019) failed to calculate for the following reasons:   Risk score cannot be calculated because patient has a medical history suggesting prior/existing ASCVD   * - Cholesterol units were  assumed  Objective:  Physical Exam      Assessment And Plan:   Assessment & Plan Pain of right lower extremity  Cough, unspecified type   No orders of the defined types were placed in this encounter.    No follow-ups on file.  Patient was given opportunity to ask questions. Patient verbalized understanding of the plan and was able to repeat key elements of the plan. All questions were answered to their satisfaction.    LILLETTE Tammy JONELLE Fischer, DO, have reviewed all documentation for this visit. The documentation on 02/03/25 for the exam, diagnosis, procedures, and orders are all accurate and complete.   IF YOU HAVE BEEN REFERRED TO A SPECIALIST, IT MAY TAKE 1-2 WEEKS TO SCHEDULE/PROCESS THE REFERRAL. IF YOU HAVE NOT HEARD FROM US /SPECIALIST IN TWO WEEKS, PLEASE GIVE US  A CALL AT [phone].      [1]  Current Outpatient Medications:    albuterol  (VENTOLIN  HFA) 108 (90 Base) MCG/ACT inhaler, Inhale 2 puffs into the lungs every 4 (four) hours as needed for wheezing or shortness of breath., Disp: 18 g, Rfl: 12   APIXABAN  (ELIQUIS ) VTE STARTER PACK (10MG   AND 5MG ), Take as directed on package: start with two-5mg  tablets twice daily for 7 days. On day 8, switch to one-5mg  tablet twice daily., Disp: 74 each, Rfl: 0   arformoterol  (BROVANA ) 15 MCG/2ML NEBU, Take 2 mLs (15 mcg total) by nebulization 2 (two) times daily., Disp: 120 mL, Rfl: 12   atorvastatin  (LIPITOR) 80 MG tablet, TAKE 1 TABLET BY MOUTH EVERY DAY MONDAY THROUGH FRIDAY, Disp: 90 tablet, Rfl: 1   Biotin 10000 MCG TBDP, Take 10,000 mcg by mouth daily., Disp: , Rfl:    budesonide  (PULMICORT ) 0.5 MG/2ML nebulizer solution, Take 2 mLs (0.5 mg total) by nebulization 2 (two) times daily., Disp: 120 mL, Rfl: 12   buPROPion  (WELLBUTRIN  XL) 150 MG 24 hr tablet, Take 150 mg by mouth daily., Disp: , Rfl:    Calcium  Carbonate (CALCIUM  600 PO), Take 600 mg by mouth daily., Disp: , Rfl:    CELEBREX 200 MG capsule, Take 200 mg by mouth  daily., Disp: , Rfl:    cholecalciferol (VITAMIN D3) 25 MCG (1000 UNIT) tablet, Take 1,000 Units by mouth daily., Disp: , Rfl:    doxycycline  (VIBRA -TABS) 100 MG tablet, Take 1 tablet (100 mg total) by mouth 2 (two) times daily., Disp: 20 tablet, Rfl: 0   DULoxetine  (CYMBALTA ) 60 MG capsule, TAKE 1 CAPSULE(60 MG) BY MOUTH DAILY, Disp: 90 capsule, Rfl: 1   famotidine  (PEPCID ) 20 MG tablet, Take 1 tablet (20 mg total) by mouth daily., Disp: , Rfl:    folic acid  (FOLVITE ) 1 MG tablet, Take 1 tablet (1 mg total) by mouth daily. Start 7 days before pemetrexed  chemotherapy. Continue until 21 days after pemetrexed  completed., Disp: 100 tablet, Rfl: 3   ipratropium-albuterol  (DUONEB) 0.5-2.5 (3) MG/3ML SOLN, Take 3 mLs by nebulization every 4 (four) hours as needed. (Patient not taking: Reported on 01/10/2025), Disp: 360 mL, Rfl: 0   lidocaine -prilocaine  (EMLA ) cream, Apply to affected area once (Patient not taking: Reported on 01/10/2025), Disp: 30 g, Rfl: 3   magnesium  gluconate (MAGONATE) 500 (27 Mg) MG TABS tablet, Take 500 mg by mouth daily., Disp: , Rfl:    methocarbamol  (ROBAXIN ) 500 MG tablet, Take 1 tablet (500 mg total) by mouth every 8 (eight) hours as needed for muscle spasms. (Patient not taking: Reported on 01/10/2025), Disp: 30 tablet, Rfl: 0   montelukast  (SINGULAIR ) 10 MG tablet, TAKE 1 TABLET(10 MG) BY MOUTH AT BEDTIME, Disp: 90 tablet, Rfl: 3   ondansetron  (ZOFRAN ) 8 MG tablet, Take 1 tablet (8 mg total) by mouth every 8 (eight) hours as needed for nausea or vomiting. Start on the third day after carboplatin ., Disp: 30 tablet, Rfl: 1   oxyCODONE -acetaminophen  (PERCOCET) 10-325 MG tablet, Take 1 tablet by mouth every 6 (six) hours as needed for pain., Disp: 30 tablet, Rfl: 0   pantoprazole  (PROTONIX ) 40 MG tablet, TAKE 1 TABLET BY MOUTH 30 MINUTES BEFORE FIRST MEAL OF THE DAY, Disp: 90 tablet, Rfl: 2   pregabalin  (LYRICA ) 100 MG capsule, Take 1 capsule (100 mg total) by mouth 3 (three) times  daily. TAKE 1 CAPSULE(100 MG) BY MOUTH THREE TIMES DAILY (Patient not taking: Reported on 01/10/2025), Disp: 90 capsule, Rfl: 2   prochlorperazine  (COMPAZINE ) 10 MG tablet, Take 1 tablet (10 mg total) by mouth every 6 (six) hours as needed for nausea or vomiting., Disp: 30 tablet, Rfl: 1   REPATHA  SURECLICK 140 MG/ML SOAJ, ADMINISTER 1 ML UNDER THE SKIN EVERY 14 DAYS, Disp: 6 mL, Rfl: 3   Semaglutide , 1 MG/DOSE, (OZEMPIC ,  1 MG/DOSE,) 4 MG/3ML SOPN, Inject 1 mg into the skin once a week. Friday (Patient taking differently: Inject 0.5 mg into the skin once a week. Inject 0.5mg  weekly.), Disp: 3 mL, Rfl: 3 [2]  Allergies Allergen Reactions   Other Swelling    HAIR DYE

## 2025-02-03 NOTE — Assessment & Plan Note (Addendum)
"      The rapid COVID-19 test/influenza A/B test done on 02/03/2025 were negative.     Her oncologist ordered a chest x-ray around 11 AM on 02/03/2025 and started her on doxycycline .  I showed this patient how to perform cupping on 02/03/2025 to break up the rest of her secretions.  She voiced understanding. Orders:   POC Covid19/Flu A&B Antigen  "

## 2025-02-03 NOTE — Telephone Encounter (Signed)
 Spoke with the patient in regards to Chest Xray results.  Per Cassie, PA, patients chest xray does not show any acute process for her cough.  She voiced understanding.

## 2025-02-03 NOTE — Progress Notes (Unsigned)
 "  Acute Office Visit  Subjective:     Patient ID: [name], female    DOB: 1946/03/05, 79 y.o.   MRN: [id number]  Chief Complaint  Patient presents with   Leg Pain    Patient presents today for tigh pain. She reports the pain started a couple weeks ago. She feels like someone is shooting her with fire in her thigh. She reports the pain makes her jump out of her sleep.    This is a African-American female this is a 79 year old African-American female who is here today because she was going to get chemotherapy this morning but this was put on hold because of her COUGH.  She has been having a productive cough for the past 2 weeks that is now getting worse.  The chemotherapy physician did order a chest x-ray that was done around 11 AM this morning and she was prescribed doxycycline .  She does have a runny nose, watery eyes and wheezing which is not new.  She does have intermittent shortness of breath that is worse when she moves from a sitting to a standing position.  She does have chest pressure secondary to the cough.  She states that she normally takes albuterol  1-2x/day and this has been increased to 5-6 times a day for the past 3-4 days.  She also has thumping sensation in her ears.  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  She has been having a right lateral thigh pain for the past week that is waking her up during the nighttime.  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  She states that this right thigh pain is new.  She does have a history of FALLING 5 weeks ago and hurt her right arm which she was diagnosed with having a rotator cuff tear via an MRI on her right shoulder.  She is seeing an orthopedic physician for this shoulder.  She is currently undergoing physical therapy for this right shoulder pain.  She does have chills but denies any fevers.   She does have fatigue which is chronic.     Review of Systems  Constitutional:  Positive for chills and malaise/fatigue (Not new). Negative for fever and weight loss.  HENT:  Negative for congestion and sinus pain.        Runny nose-2 weeks  Respiratory:  Positive for cough (Cough-2 weeks), sputum production, shortness of breath and wheezing.   Musculoskeletal:  Positive for falls and myalgias.  Neurological:  Negative for dizziness and headaches.        Objective:    Vitals:   02/03/25 1147  BP: 110/60  Pulse: 86  Temp: (!) 96.3 F (35.7 C)  Height: 5' 5 (1.651 m)  Weight: 163 lb (73.9 kg)  TempSrc: Oral  BMI (Calculated): 27.12    BP Readings from Last 3 Encounters:  02/03/25 110/60  02/03/25 (!) 132/53  01/13/25 120/67   Wt Readings from Last 3 Encounters:  02/03/25 163 lb (73.9 kg)  02/03/25 163 lb (73.9 kg)  01/13/25 154 lb (69.9 kg)      Physical Exam Constitutional:      Appearance: Normal appearance.     Comments: She has gained 9 pounds since 01/13/2025.  HENT:     Head: Normocephalic and atraumatic.     Right Ear: Tympanic membrane, ear canal and external  ear normal.     Left Ear: Tympanic membrane, ear canal and external ear normal.     Nose: Nose normal.     Mouth/Throat:     Mouth: Mucous membranes are moist.     Pharynx: Oropharynx is clear.  Eyes:     General:        Right eye: No discharge.        Left eye: No discharge.     Conjunctiva/sclera: Conjunctivae normal.     Pupils: Pupils are equal, round, and reactive to light.  Cardiovascular:     Pulses: Normal pulses.     Heart sounds: Normal heart sounds.  Pulmonary:     Effort: Pulmonary effort is normal.     Breath sounds: Wheezing present.     Comments: The presence of expiratory wheezing over the bilateral posterior lung fields with decreased breath sounds over the bilateral bases right equals left.  After nebulized treatment: Improved respiratory effort at over the bilateral  posterior lung fields especially over the bases with the presence of coarse expiratory wheezing right equals left. Musculoskeletal:     Comments: Tenderness with palpation over the right greater trochanter.  No tenderness with palpation over the right anterior/lateral thigh.  No tenderness with palpation over the right lower lumbar spine.  Neurological:     Mental Status: She is alert.     Results for orders placed or performed in visit on 02/03/25  CMP (Cancer Center only)  Result Value Ref Range   Sodium 140 135 - 145 mmol/L   Potassium 4.0 3.5 - 5.1 mmol/L   Chloride 103 98 - 111 mmol/L   CO2 26 22 - 32 mmol/L   Glucose, Bld 133 (H) 70 - 99 mg/dL   BUN 8 8 - 23 mg/dL   Creatinine 9.20 9.55 - 1.00 mg/dL   Calcium  9.1 8.9 - 10.3 mg/dL   Total Protein 7.0 6.5 - 8.1 g/dL   Albumin 3.6 3.5 - 5.0 g/dL   AST 35 15 - 41 U/L   ALT 18 0 - 44 U/L   Alkaline Phosphatase 82 38 - 126 U/L   Total Bilirubin 0.3 0.0 - 1.2 mg/dL   GFR, Estimated >39 >39 mL/min   Anion gap 11 5 - 15  CBC with Differential (Cancer Center Only)  Result Value Ref Range   WBC Count 3.2 (L) 4.0 - 10.5 K/uL   RBC 3.39 (L) 3.87 - 5.11 MIL/uL   Hemoglobin 9.1 (L) 12.0 - 15.0 g/dL   HCT 72.2 (L) 63.9 - 53.9 %   MCV 81.7 80.0 - 100.0 fL   MCH 26.8 26.0 - 34.0 pg   MCHC 32.9 30.0 - 36.0 g/dL   RDW 75.8 (H) 88.4 - 84.4 %   Platelet Count 333 150 - 400 K/uL   nRBC 0.0 0.0 - 0.2 %   Neutrophils Relative % 54 %   Neutro Abs 1.7 1.7 - 7.7 K/uL   Lymphocytes Relative 28 %   Lymphs Abs 0.9 0.7 - 4.0 K/uL   Monocytes Relative 15 %   Monocytes Absolute 0.5 0.1 - 1.0 K/uL   Eosinophils Relative 2 %   Eosinophils Absolute 0.1 0.0 - 0.5 K/uL   Basophils Relative 1 %   Basophils Absolute 0.0 0.0 - 0.1 K/uL   Immature Granulocytes 0 %   Abs Immature Granulocytes 0.01 0.00 - 0.07 K/uL  Sample to Blood Bank  Result Value Ref Range   Blood Bank Specimen SAMPLE AVAILABLE FOR TESTING  Sample Expiration       02/06/2025,2359 Performed at [facility], [address] [place], [place] [address]         Assessment & Plan:   Assessment & Plan Cough, unspecified type     The rapid COVID-19 test/influenza A/B test done on 02/03/2025 were negative.     Her oncologist ordered a chest x-ray around 11 AM on 02/03/2025 and started her on doxycycline .  I showed this patient how to perform cupping on 02/03/2025 to break up the rest of her secretions.  She voiced understanding. Orders:   POC Covid19/Flu A&B Antigen  Expiratory wheezing I had my nurse give her an albuterol  nebulized treatment on 02/03/2025.  The expiratory wheezing did improve with the nebulized treatment but was still present after the treatment.    Sinus drainage     Thigh pain, musculoskeletal, right I ordered a x-ray on her right femur and 02/03/2025. Orders:   DG FEMUR, MIN 2 VIEWS RIGHT; Future  Pain of right hip I ordered an x-ray on her right hip on 02/03/2025    Anemia, unspecified type I reviewed the labs done at 02/03/2025; white blood cells were low at 3.2, the red blood cells were low at 3.39, H&H were 9.1 and 27.7 respectively.  The MCV was 81.7.     Return if symptoms worsen or fail to improve.  [place] JONELLE Fischer, DO   "

## 2025-02-04 LAB — T4: T4, Total: 8.6 ug/dL (ref 4.5–12.0)

## 2025-02-09 ENCOUNTER — Inpatient Hospital Stay

## 2025-02-10 ENCOUNTER — Inpatient Hospital Stay

## 2025-02-11 ENCOUNTER — Other Ambulatory Visit

## 2025-02-15 ENCOUNTER — Inpatient Hospital Stay: Admitting: Internal Medicine

## 2025-02-15 ENCOUNTER — Inpatient Hospital Stay

## 2025-02-16 ENCOUNTER — Inpatient Hospital Stay

## 2025-02-22 ENCOUNTER — Other Ambulatory Visit

## 2025-02-22 ENCOUNTER — Encounter: Payer: Medicare HMO | Admitting: Internal Medicine

## 2025-02-23 ENCOUNTER — Other Ambulatory Visit

## 2025-02-24 ENCOUNTER — Inpatient Hospital Stay

## 2025-02-24 ENCOUNTER — Inpatient Hospital Stay: Admitting: Internal Medicine

## 2025-03-02 ENCOUNTER — Inpatient Hospital Stay

## 2025-03-03 ENCOUNTER — Inpatient Hospital Stay: Attending: Internal Medicine

## 2025-03-03 ENCOUNTER — Inpatient Hospital Stay

## 2025-03-03 ENCOUNTER — Inpatient Hospital Stay: Admitting: Internal Medicine

## 2025-03-09 ENCOUNTER — Inpatient Hospital Stay

## 2025-03-16 ENCOUNTER — Inpatient Hospital Stay

## 2025-03-17 ENCOUNTER — Inpatient Hospital Stay: Admitting: Physician Assistant

## 2025-03-17 ENCOUNTER — Inpatient Hospital Stay

## 2025-03-23 ENCOUNTER — Inpatient Hospital Stay

## 2025-03-24 ENCOUNTER — Inpatient Hospital Stay

## 2025-03-24 ENCOUNTER — Inpatient Hospital Stay: Admitting: Physician Assistant

## 2025-03-30 ENCOUNTER — Inpatient Hospital Stay: Attending: Internal Medicine

## 2025-04-06 ENCOUNTER — Inpatient Hospital Stay

## 2025-04-07 ENCOUNTER — Inpatient Hospital Stay

## 2025-04-07 ENCOUNTER — Inpatient Hospital Stay: Admitting: Internal Medicine

## 2025-04-14 ENCOUNTER — Inpatient Hospital Stay

## 2025-04-14 ENCOUNTER — Inpatient Hospital Stay: Admitting: Internal Medicine

## 2025-04-27 ENCOUNTER — Ambulatory Visit

## 2025-04-27 ENCOUNTER — Ambulatory Visit: Payer: Self-pay
